# Patient Record
Sex: Male | Born: 1955 | Race: Black or African American | Hispanic: No | Marital: Married | Smoking: Former smoker
Health system: Southern US, Community
[De-identification: ages and names within clinical notes are randomized; demographics above are authoritative.]

## PROBLEM LIST (undated history)

## (undated) DIAGNOSIS — R198 Other specified symptoms and signs involving the digestive system and abdomen: Secondary | ICD-10-CM

## (undated) DIAGNOSIS — Z9289 Personal history of other medical treatment: Secondary | ICD-10-CM

## (undated) DIAGNOSIS — K56609 Unspecified intestinal obstruction, unspecified as to partial versus complete obstruction: Secondary | ICD-10-CM

## (undated) DIAGNOSIS — F419 Anxiety disorder, unspecified: Secondary | ICD-10-CM

## (undated) DIAGNOSIS — K469 Unspecified abdominal hernia without obstruction or gangrene: Secondary | ICD-10-CM

## (undated) DIAGNOSIS — K439 Ventral hernia without obstruction or gangrene: Secondary | ICD-10-CM

## (undated) DIAGNOSIS — C61 Malignant neoplasm of prostate: Secondary | ICD-10-CM

## (undated) DIAGNOSIS — K5090 Crohn's disease, unspecified, without complications: Secondary | ICD-10-CM

## (undated) HISTORY — PX: COLON SURGERY: SHX602

## (undated) HISTORY — DX: Crohn's disease, unspecified, without complications: K50.90

## (undated) HISTORY — DX: Unspecified intestinal obstruction, unspecified as to partial versus complete obstruction: K56.609

## (undated) HISTORY — DX: Other specified symptoms and signs involving the digestive system and abdomen: R19.8

## (undated) HISTORY — DX: Anxiety disorder, unspecified: F41.9

## (undated) HISTORY — DX: Ventral hernia without obstruction or gangrene: K43.9

---

## 1980-12-22 DIAGNOSIS — K5090 Crohn's disease, unspecified, without complications: Secondary | ICD-10-CM

## 1980-12-22 HISTORY — DX: Crohn's disease, unspecified, without complications: K50.90

## 1986-12-22 HISTORY — PX: FLEXIBLE SIGMOIDOSCOPY: SHX1649

## 1990-12-22 DIAGNOSIS — Z9289 Personal history of other medical treatment: Secondary | ICD-10-CM

## 1990-12-22 HISTORY — PX: APPENDECTOMY: SHX54

## 1990-12-22 HISTORY — PX: COLOSTOMY: SHX63

## 1990-12-22 HISTORY — PX: EXPLORATORY LAPAROTOMY: SUR591

## 1990-12-22 HISTORY — DX: Personal history of other medical treatment: Z92.89

## 1991-12-23 HISTORY — PX: HEMICOLOECTOMY W/ ANASTOMOSIS: SHX1739

## 1994-12-22 HISTORY — PX: HERNIA REPAIR: SHX51

## 2002-10-01 ENCOUNTER — Encounter (INDEPENDENT_AMBULATORY_CARE_PROVIDER_SITE_OTHER): Payer: Self-pay | Admitting: Family Medicine

## 2002-10-08 ENCOUNTER — Encounter (INDEPENDENT_AMBULATORY_CARE_PROVIDER_SITE_OTHER): Payer: Self-pay | Admitting: Family Medicine

## 2002-10-09 ENCOUNTER — Encounter (INDEPENDENT_AMBULATORY_CARE_PROVIDER_SITE_OTHER): Payer: Self-pay | Admitting: Family Medicine

## 2002-10-10 ENCOUNTER — Encounter (INDEPENDENT_AMBULATORY_CARE_PROVIDER_SITE_OTHER): Payer: Self-pay | Admitting: Family Medicine

## 2006-04-09 ENCOUNTER — Ambulatory Visit: Payer: Self-pay | Admitting: Family Medicine

## 2006-04-27 ENCOUNTER — Ambulatory Visit: Payer: Self-pay | Admitting: Family Medicine

## 2006-04-27 LAB — CONVERTED CEMR LAB
RBC count: 4.44 10*6/uL
WBC, blood: 9.7 10*3/uL

## 2006-04-28 ENCOUNTER — Encounter (INDEPENDENT_AMBULATORY_CARE_PROVIDER_SITE_OTHER): Payer: Self-pay | Admitting: Family Medicine

## 2006-05-04 ENCOUNTER — Ambulatory Visit: Payer: Self-pay | Admitting: Family Medicine

## 2006-06-01 ENCOUNTER — Ambulatory Visit: Payer: Self-pay | Admitting: Family Medicine

## 2006-06-04 ENCOUNTER — Encounter (INDEPENDENT_AMBULATORY_CARE_PROVIDER_SITE_OTHER): Payer: Self-pay | Admitting: Family Medicine

## 2006-06-08 ENCOUNTER — Encounter (INDEPENDENT_AMBULATORY_CARE_PROVIDER_SITE_OTHER): Payer: Self-pay | Admitting: Family Medicine

## 2006-06-09 ENCOUNTER — Ambulatory Visit: Payer: Self-pay | Admitting: Family Medicine

## 2006-06-17 ENCOUNTER — Encounter (INDEPENDENT_AMBULATORY_CARE_PROVIDER_SITE_OTHER): Payer: Self-pay | Admitting: Family Medicine

## 2006-06-19 ENCOUNTER — Ambulatory Visit: Payer: Self-pay | Admitting: Family Medicine

## 2006-06-19 ENCOUNTER — Ambulatory Visit: Payer: Self-pay | Admitting: Internal Medicine

## 2007-01-25 ENCOUNTER — Encounter: Payer: Self-pay | Admitting: Family Medicine

## 2007-01-25 DIAGNOSIS — K5090 Crohn's disease, unspecified, without complications: Secondary | ICD-10-CM | POA: Insufficient documentation

## 2007-01-25 DIAGNOSIS — F411 Generalized anxiety disorder: Secondary | ICD-10-CM | POA: Insufficient documentation

## 2007-01-25 DIAGNOSIS — M545 Low back pain: Secondary | ICD-10-CM

## 2008-09-13 ENCOUNTER — Emergency Department (HOSPITAL_COMMUNITY): Admission: EM | Admit: 2008-09-13 | Discharge: 2008-09-13 | Payer: Self-pay | Admitting: Emergency Medicine

## 2009-02-12 ENCOUNTER — Emergency Department (HOSPITAL_COMMUNITY): Admission: EM | Admit: 2009-02-12 | Discharge: 2009-02-12 | Payer: Self-pay | Admitting: Emergency Medicine

## 2009-04-05 ENCOUNTER — Emergency Department (HOSPITAL_COMMUNITY): Admission: EM | Admit: 2009-04-05 | Discharge: 2009-04-05 | Payer: Self-pay | Admitting: Emergency Medicine

## 2009-05-22 HISTORY — PX: ESOPHAGOGASTRODUODENOSCOPY: SHX1529

## 2009-06-01 ENCOUNTER — Ambulatory Visit: Payer: Self-pay | Admitting: Internal Medicine

## 2009-06-01 ENCOUNTER — Inpatient Hospital Stay (HOSPITAL_COMMUNITY): Admission: EM | Admit: 2009-06-01 | Discharge: 2009-06-03 | Payer: Self-pay | Admitting: Emergency Medicine

## 2009-06-02 ENCOUNTER — Encounter: Payer: Self-pay | Admitting: Gastroenterology

## 2009-06-02 ENCOUNTER — Ambulatory Visit: Payer: Self-pay | Admitting: Gastroenterology

## 2009-06-18 ENCOUNTER — Encounter: Payer: Self-pay | Admitting: Internal Medicine

## 2009-09-19 ENCOUNTER — Ambulatory Visit: Payer: Self-pay | Admitting: Internal Medicine

## 2009-09-20 ENCOUNTER — Encounter: Payer: Self-pay | Admitting: Internal Medicine

## 2009-09-21 ENCOUNTER — Telehealth (INDEPENDENT_AMBULATORY_CARE_PROVIDER_SITE_OTHER): Payer: Self-pay | Admitting: *Deleted

## 2009-09-21 ENCOUNTER — Encounter: Payer: Self-pay | Admitting: Gastroenterology

## 2009-10-04 ENCOUNTER — Ambulatory Visit: Payer: Self-pay | Admitting: Gastroenterology

## 2009-10-04 ENCOUNTER — Ambulatory Visit (HOSPITAL_COMMUNITY): Admission: RE | Admit: 2009-10-04 | Discharge: 2009-10-04 | Payer: Self-pay | Admitting: Gastroenterology

## 2009-10-04 HISTORY — PX: EUS: SHX5427

## 2009-10-31 ENCOUNTER — Encounter: Payer: Self-pay | Admitting: Internal Medicine

## 2009-10-31 ENCOUNTER — Encounter: Payer: Self-pay | Admitting: Gastroenterology

## 2009-12-03 DIAGNOSIS — D649 Anemia, unspecified: Secondary | ICD-10-CM

## 2009-12-03 DIAGNOSIS — Z872 Personal history of diseases of the skin and subcutaneous tissue: Secondary | ICD-10-CM | POA: Insufficient documentation

## 2009-12-04 ENCOUNTER — Ambulatory Visit: Payer: Self-pay | Admitting: Internal Medicine

## 2009-12-05 ENCOUNTER — Encounter: Payer: Self-pay | Admitting: Internal Medicine

## 2009-12-11 DIAGNOSIS — K439 Ventral hernia without obstruction or gangrene: Secondary | ICD-10-CM

## 2009-12-25 ENCOUNTER — Encounter: Payer: Self-pay | Admitting: Internal Medicine

## 2009-12-27 ENCOUNTER — Encounter: Payer: Self-pay | Admitting: Internal Medicine

## 2009-12-27 ENCOUNTER — Encounter (INDEPENDENT_AMBULATORY_CARE_PROVIDER_SITE_OTHER): Payer: Self-pay

## 2009-12-27 LAB — CONVERTED CEMR LAB
ALT: <8 units/L (ref 0–53)
AST: 12 units/L (ref 0–37)
Albumin: 3.5 g/dL (ref 3.5–5.2)
Alkaline Phosphatase: 64 units/L (ref 39–117)
BUN: 9 mg/dL (ref 6–23)
Basophils Absolute: 0 10*3/uL (ref 0.0–0.1)
Basophils Relative: 0 % (ref 0–1)
Calcium: 8.1 mg/dL — ABNORMAL LOW (ref 8.4–10.5)
Chloride: 104 meq/L (ref 96–112)
Eosinophils Absolute: 0.2 10*3/uL (ref 0.0–0.7)
Eosinophils Relative: 3 % (ref 0–5)
Glucose, Bld: 106 mg/dL — ABNORMAL HIGH (ref 70–99)
HCT: 30.6 % — ABNORMAL LOW (ref 39.0–52.0)
Hemoglobin: 9.4 g/dL — ABNORMAL LOW (ref 13.0–17.0)
Lymphocytes Relative: 21 % (ref 12–46)
Lymphs Abs: 1.6 10*3/uL (ref 0.7–4.0)
MCV: 93.3 fL (ref 78.0–100.0)
Monocytes Absolute: 0.6 10*3/uL (ref 0.1–1.0)
Monocytes Relative: 8 % (ref 3–12)
Neutro Abs: 5 10*3/uL (ref 1.7–7.7)
Platelets: 390 10*3/uL (ref 150–400)
Potassium: 3.7 meq/L (ref 3.5–5.3)
RBC: 3.28 M/uL — ABNORMAL LOW (ref 4.22–5.81)
RDW: 14.9 % (ref 11.5–15.5)
Sodium: 140 meq/L (ref 135–145)
Total Bilirubin: 0.5 mg/dL (ref 0.3–1.2)
WBC: 7.4 10*3/uL (ref 4.0–10.5)

## 2009-12-31 ENCOUNTER — Ambulatory Visit (HOSPITAL_COMMUNITY): Admission: RE | Admit: 2009-12-31 | Discharge: 2009-12-31 | Payer: Self-pay | Admitting: Internal Medicine

## 2010-01-07 ENCOUNTER — Ambulatory Visit (HOSPITAL_COMMUNITY): Admission: RE | Admit: 2010-01-07 | Discharge: 2010-01-07 | Payer: Self-pay | Admitting: Internal Medicine

## 2010-01-11 LAB — CONVERTED CEMR LAB
ALT: <8 units/L (ref 0–53)
AST: 16 units/L (ref 0–37)
Albumin: 3.6 g/dL (ref 3.5–5.2)
Alkaline Phosphatase: 61 units/L (ref 39–117)
Bilirubin, Direct: 0.1 mg/dL (ref 0.0–0.3)
Folate: 8.6 ng/mL
Indirect Bilirubin: 0.3 mg/dL (ref 0.0–0.9)
Iron: 35 ug/dL — ABNORMAL LOW (ref 42–165)
TIBC: 357 ug/dL (ref 215–435)
Total Bilirubin: 0.4 mg/dL (ref 0.3–1.2)
Total Protein: 7.2 g/dL (ref 6.0–8.3)
UIBC: 322 ug/dL
Vitamin B-12: 435 pg/mL (ref 211–911)

## 2010-01-25 ENCOUNTER — Ambulatory Visit: Payer: Self-pay | Admitting: Internal Medicine

## 2010-01-28 ENCOUNTER — Encounter: Payer: Self-pay | Admitting: Internal Medicine

## 2010-02-05 ENCOUNTER — Encounter (INDEPENDENT_AMBULATORY_CARE_PROVIDER_SITE_OTHER): Payer: Self-pay

## 2010-02-11 ENCOUNTER — Encounter: Payer: Self-pay | Admitting: Urgent Care

## 2010-02-11 ENCOUNTER — Telehealth (INDEPENDENT_AMBULATORY_CARE_PROVIDER_SITE_OTHER): Payer: Self-pay

## 2010-02-13 ENCOUNTER — Encounter: Payer: Self-pay | Admitting: Urgent Care

## 2010-02-21 ENCOUNTER — Encounter: Payer: Self-pay | Admitting: Internal Medicine

## 2010-03-27 ENCOUNTER — Telehealth (INDEPENDENT_AMBULATORY_CARE_PROVIDER_SITE_OTHER): Payer: Self-pay

## 2010-03-29 ENCOUNTER — Encounter (INDEPENDENT_AMBULATORY_CARE_PROVIDER_SITE_OTHER): Payer: Self-pay | Admitting: *Deleted

## 2010-04-04 ENCOUNTER — Encounter (INDEPENDENT_AMBULATORY_CARE_PROVIDER_SITE_OTHER): Payer: Self-pay

## 2010-04-04 ENCOUNTER — Encounter: Payer: Self-pay | Admitting: Urgent Care

## 2010-04-11 ENCOUNTER — Encounter: Payer: Self-pay | Admitting: Gastroenterology

## 2010-04-19 ENCOUNTER — Encounter (INDEPENDENT_AMBULATORY_CARE_PROVIDER_SITE_OTHER): Payer: Self-pay

## 2010-04-19 ENCOUNTER — Telehealth (INDEPENDENT_AMBULATORY_CARE_PROVIDER_SITE_OTHER): Payer: Self-pay

## 2010-04-29 LAB — CONVERTED CEMR LAB
Albumin: 3.7 g/dL (ref 3.5–5.2)
Alkaline Phosphatase: 62 units/L (ref 39–117)
BUN: 10 mg/dL (ref 6–23)
Basophils Absolute: 0.1 10*3/uL (ref 0.0–0.1)
Basophils Relative: 1 % (ref 0–1)
Calcium: 8.7 mg/dL (ref 8.4–10.5)
Creatinine, Ser: 1.31 mg/dL (ref 0.40–1.50)
Eosinophils Absolute: 0.5 10*3/uL (ref 0.0–0.7)
Glucose, Bld: 82 mg/dL (ref 70–99)
Hemoglobin: 9.8 g/dL — ABNORMAL LOW (ref 13.0–17.0)
Lymphocytes Relative: 33 % (ref 12–46)
Lymphs Abs: 2.4 10*3/uL (ref 0.7–4.0)
MCHC: 32 g/dL (ref 30.0–36.0)
MCV: 89.7 fL (ref 78.0–100.0)
Monocytes Absolute: 0.5 10*3/uL (ref 0.1–1.0)
Monocytes Relative: 7 % (ref 3–12)
Neutro Abs: 3.8 10*3/uL (ref 1.7–7.7)
Neutrophils Relative %: 52 % (ref 43–77)
Potassium: 4.3 meq/L (ref 3.5–5.3)
RBC: 3.41 M/uL — ABNORMAL LOW (ref 4.22–5.81)
RDW: 15.7 % — ABNORMAL HIGH (ref 11.5–15.5)
Sodium: 138 meq/L (ref 135–145)
Total Bilirubin: 0.5 mg/dL (ref 0.3–1.2)
Total Protein: 7 g/dL (ref 6.0–8.3)
WBC: 7.2 10*3/uL (ref 4.0–10.5)

## 2010-05-06 LAB — CONVERTED CEMR LAB
Eosinophils Absolute: 0.5 10*3/uL (ref 0.0–0.7)
Eosinophils Relative: 7 % — ABNORMAL HIGH (ref 0–5)
HCT: 32.5 % — ABNORMAL LOW (ref 39.0–52.0)
Hemoglobin: 10 g/dL — ABNORMAL LOW (ref 13.0–17.0)
Lymphocytes Relative: 35 % (ref 12–46)
Lymphs Abs: 2.3 10*3/uL (ref 0.7–4.0)
MCHC: 30.8 g/dL (ref 30.0–36.0)
MCV: 92.3 fL (ref 78.0–100.0)
Monocytes Absolute: 0.4 10*3/uL (ref 0.1–1.0)
Neutro Abs: 3.4 10*3/uL (ref 1.7–7.7)
Neutrophils Relative %: 51 % (ref 43–77)
Platelets: 389 10*3/uL (ref 150–400)
RBC: 3.52 M/uL — ABNORMAL LOW (ref 4.22–5.81)
WBC: 6.7 10*3/uL (ref 4.0–10.5)

## 2010-05-07 ENCOUNTER — Ambulatory Visit: Payer: Self-pay | Admitting: Internal Medicine

## 2010-05-08 ENCOUNTER — Encounter: Payer: Self-pay | Admitting: Urgent Care

## 2010-05-23 LAB — CONVERTED CEMR LAB
Basophils Absolute: 0.1 10*3/uL (ref 0.0–0.1)
Basophils Relative: 1 % (ref 0–1)
Eosinophils Absolute: 0.5 10*3/uL (ref 0.0–0.7)
Eosinophils Relative: 8 % — ABNORMAL HIGH (ref 0–5)
Hemoglobin: 11.4 g/dL — ABNORMAL LOW (ref 13.0–17.0)
Lymphs Abs: 2.2 10*3/uL (ref 0.7–4.0)
MCHC: 30.4 g/dL (ref 30.0–36.0)
MCV: 94.2 fL (ref 78.0–100.0)
Monocytes Absolute: 0.3 10*3/uL (ref 0.1–1.0)
Monocytes Relative: 5 % (ref 3–12)
Neutro Abs: 3.3 10*3/uL (ref 1.7–7.7)
Neutrophils Relative %: 51 % (ref 43–77)
Platelets: 370 10*3/uL (ref 150–400)
RBC: 3.98 M/uL — ABNORMAL LOW (ref 4.22–5.81)
WBC: 6.4 10*3/uL (ref 4.0–10.5)

## 2010-06-03 ENCOUNTER — Encounter: Payer: Self-pay | Admitting: Urgent Care

## 2010-06-03 LAB — CONVERTED CEMR LAB
Basophils Absolute: 0.1 10*3/uL (ref 0.0–0.1)
Basophils Relative: 1 % (ref 0–1)
Eosinophils Relative: 6 % — ABNORMAL HIGH (ref 0–5)
HCT: 35.4 % — ABNORMAL LOW (ref 39.0–52.0)
Hemoglobin: 11.7 g/dL — ABNORMAL LOW (ref 13.0–17.0)
Lymphocytes Relative: 35 % (ref 12–46)
Lymphs Abs: 2.1 10*3/uL (ref 0.7–4.0)
MCHC: 33.1 g/dL (ref 30.0–36.0)
MCV: 92.4 fL (ref 78.0–100.0)
Monocytes Absolute: 0.5 10*3/uL (ref 0.1–1.0)
Monocytes Relative: 8 % (ref 3–12)
Neutro Abs: 3 10*3/uL (ref 1.7–7.7)
Neutrophils Relative %: 49 % (ref 43–77)
Platelets: 305 10*3/uL (ref 150–400)
RBC: 3.83 M/uL — ABNORMAL LOW (ref 4.22–5.81)
RDW: 14.2 % (ref 11.5–15.5)

## 2010-06-10 ENCOUNTER — Encounter: Payer: Self-pay | Admitting: Gastroenterology

## 2010-06-21 ENCOUNTER — Encounter (INDEPENDENT_AMBULATORY_CARE_PROVIDER_SITE_OTHER): Payer: Self-pay

## 2010-06-21 LAB — CONVERTED CEMR LAB
ALT: <8 units/L (ref 0–53)
AST: 15 units/L (ref 0–37)
Albumin: 3.7 g/dL (ref 3.5–5.2)
Alkaline Phosphatase: 50 units/L (ref 39–117)
BUN: 15 mg/dL (ref 6–23)
Basophils Relative: 1 % (ref 0–1)
Calcium: 9 mg/dL (ref 8.4–10.5)
Chloride: 105 meq/L (ref 96–112)
Eosinophils Absolute: 0.3 10*3/uL (ref 0.0–0.7)
Glucose, Bld: 82 mg/dL (ref 70–99)
HCT: 36.9 % — ABNORMAL LOW (ref 39.0–52.0)
Hemoglobin: 12.2 g/dL — ABNORMAL LOW (ref 13.0–17.0)
Lymphocytes Relative: 32 % (ref 12–46)
Lymphs Abs: 2.1 10*3/uL (ref 0.7–4.0)
MCHC: 33.1 g/dL (ref 30.0–36.0)
MCV: 92.3 fL (ref 78.0–100.0)
Monocytes Absolute: 0.4 10*3/uL (ref 0.1–1.0)
Neutro Abs: 3.7 10*3/uL (ref 1.7–7.7)
Neutrophils Relative %: 56 % (ref 43–77)
Platelets: 380 10*3/uL (ref 150–400)
Potassium: 4.1 meq/L (ref 3.5–5.3)
RBC: 4 M/uL — ABNORMAL LOW (ref 4.22–5.81)
RDW: 14.2 % (ref 11.5–15.5)
Total Bilirubin: 0.5 mg/dL (ref 0.3–1.2)
WBC: 6.6 10*3/uL (ref 4.0–10.5)

## 2010-07-01 ENCOUNTER — Encounter: Payer: Self-pay | Admitting: Urgent Care

## 2010-07-09 ENCOUNTER — Inpatient Hospital Stay (HOSPITAL_COMMUNITY): Admission: EM | Admit: 2010-07-09 | Discharge: 2010-07-11 | Payer: Self-pay | Admitting: Emergency Medicine

## 2010-07-09 ENCOUNTER — Ambulatory Visit: Payer: Self-pay | Admitting: Internal Medicine

## 2010-07-10 ENCOUNTER — Ambulatory Visit: Payer: Self-pay | Admitting: Gastroenterology

## 2010-07-18 ENCOUNTER — Encounter: Payer: Self-pay | Admitting: Urgent Care

## 2010-07-18 ENCOUNTER — Emergency Department (HOSPITAL_COMMUNITY): Admission: EM | Admit: 2010-07-18 | Discharge: 2010-07-18 | Payer: Self-pay | Admitting: Emergency Medicine

## 2010-07-19 ENCOUNTER — Encounter (INDEPENDENT_AMBULATORY_CARE_PROVIDER_SITE_OTHER): Payer: Self-pay

## 2010-07-19 ENCOUNTER — Emergency Department (HOSPITAL_COMMUNITY): Admission: EM | Admit: 2010-07-19 | Discharge: 2010-07-19 | Payer: Self-pay | Admitting: Emergency Medicine

## 2010-07-24 ENCOUNTER — Encounter: Payer: Self-pay | Admitting: Internal Medicine

## 2010-07-31 ENCOUNTER — Encounter: Payer: Self-pay | Admitting: Urgent Care

## 2010-08-02 ENCOUNTER — Encounter: Payer: Self-pay | Admitting: Urgent Care

## 2010-08-20 ENCOUNTER — Encounter (INDEPENDENT_AMBULATORY_CARE_PROVIDER_SITE_OTHER): Payer: Self-pay

## 2010-08-27 ENCOUNTER — Ambulatory Visit: Payer: Self-pay | Admitting: Internal Medicine

## 2010-09-09 ENCOUNTER — Encounter (INDEPENDENT_AMBULATORY_CARE_PROVIDER_SITE_OTHER): Payer: Self-pay

## 2010-09-24 ENCOUNTER — Emergency Department (HOSPITAL_COMMUNITY): Admission: EM | Admit: 2010-09-24 | Discharge: 2010-09-24 | Payer: Self-pay | Admitting: Emergency Medicine

## 2010-09-27 ENCOUNTER — Encounter: Payer: Self-pay | Admitting: Internal Medicine

## 2010-09-30 ENCOUNTER — Encounter: Payer: Self-pay | Admitting: Internal Medicine

## 2010-10-15 ENCOUNTER — Ambulatory Visit: Payer: Self-pay | Admitting: Internal Medicine

## 2010-10-16 ENCOUNTER — Telehealth (INDEPENDENT_AMBULATORY_CARE_PROVIDER_SITE_OTHER): Payer: Self-pay | Admitting: *Deleted

## 2010-10-16 ENCOUNTER — Encounter: Payer: Self-pay | Admitting: Internal Medicine

## 2010-10-22 ENCOUNTER — Ambulatory Visit (HOSPITAL_COMMUNITY): Admission: RE | Admit: 2010-10-22 | Discharge: 2010-10-22 | Payer: Self-pay | Admitting: Internal Medicine

## 2010-10-25 ENCOUNTER — Encounter: Payer: Self-pay | Admitting: Gastroenterology

## 2010-10-25 ENCOUNTER — Telehealth: Payer: Self-pay | Admitting: Gastroenterology

## 2010-11-13 ENCOUNTER — Ambulatory Visit: Payer: Self-pay | Admitting: Internal Medicine

## 2010-11-13 ENCOUNTER — Encounter (INDEPENDENT_AMBULATORY_CARE_PROVIDER_SITE_OTHER): Payer: Self-pay

## 2010-11-18 ENCOUNTER — Encounter: Payer: Self-pay | Admitting: Urgent Care

## 2011-01-11 ENCOUNTER — Emergency Department (HOSPITAL_COMMUNITY)
Admission: EM | Admit: 2011-01-11 | Discharge: 2011-01-11 | Payer: Self-pay | Source: Home / Self Care | Admitting: Emergency Medicine

## 2011-01-14 LAB — CBC
HCT: 39.1 % (ref 39.0–52.0)
Hemoglobin: 13.6 g/dL (ref 13.0–17.0)
MCH: 33.1 pg (ref 26.0–34.0)
MCHC: 34.8 g/dL (ref 30.0–36.0)
Platelets: 286 10*3/uL (ref 150–400)
RBC: 4.11 MIL/uL — ABNORMAL LOW (ref 4.22–5.81)
WBC: 9.1 10*3/uL (ref 4.0–10.5)

## 2011-01-14 LAB — URINALYSIS, ROUTINE W REFLEX MICROSCOPIC
Bilirubin Urine: NEGATIVE
Nitrite: NEGATIVE
Protein, ur: NEGATIVE mg/dL
Specific Gravity, Urine: 1.025 (ref 1.005–1.030)
Urine Glucose, Fasting: NEGATIVE mg/dL
Urobilinogen, UA: 0.2 mg/dL (ref 0.0–1.0)
pH: 6 (ref 5.0–8.0)

## 2011-01-14 LAB — DIFFERENTIAL
Basophils Absolute: 0 10*3/uL (ref 0.0–0.1)
Basophils Relative: 0 % (ref 0–1)
Eosinophils Absolute: 0.2 10*3/uL (ref 0.0–0.7)
Eosinophils Relative: 2 % (ref 0–5)
Lymphs Abs: 2 10*3/uL (ref 0.7–4.0)
Monocytes Absolute: 0.9 10*3/uL (ref 0.1–1.0)
Monocytes Relative: 10 % (ref 3–12)
Neutrophils Relative %: 65 % (ref 43–77)

## 2011-01-14 LAB — COMPREHENSIVE METABOLIC PANEL
AST: 22 U/L (ref 0–37)
Albumin: 3.3 g/dL — ABNORMAL LOW (ref 3.5–5.2)
Alkaline Phosphatase: 55 U/L (ref 39–117)
BUN: 11 mg/dL (ref 6–23)
CO2: 32 mEq/L (ref 19–32)
Calcium: 9 mg/dL (ref 8.4–10.5)
Chloride: 101 mEq/L (ref 96–112)
Creatinine, Ser: 1.37 mg/dL (ref 0.4–1.5)
GFR calc Af Amer: >60 mL/min (ref >60)
GFR calc non Af Amer: 55 mL/min — ABNORMAL LOW (ref >60)
Glucose, Bld: 94 mg/dL (ref 70–99)
Total Bilirubin: 0.9 mg/dL (ref 0.3–1.2)

## 2011-01-21 NOTE — Letter (Signed)
Summary: [facility]  [address]   [place], [place] [address]   Phone: [phone]  Fax: [phone]     February 05, 2010   Daniel Howard [address] [place], [place]  [address] [birth year]  Dear Daniel Howard,  During your last appointment, your doctor requested you have some blood work and a TB test.  Our records indicate you have not had this done.  Remember it is very important to follow your doctor's instructions.   Please have this blood work done as soon as possible.  It is important that patients and their doctor work together in the management/treatment of their health care.  If you have lost or misplaced your lab orders, please give [place] a call and we will gladly give you a new copy.  If you have already had your blood work drawn, please disregard this letter.  Thank you,    Burnadette Peter LPN  [facility] Ph: [phone]   Fax: [phone]

## 2011-01-21 NOTE — Progress Notes (Signed)
Summary: Pt lost lab/TB skin test order/ new ones given  Phone Note Call from Patient   Caller: Patient Summary of Call: Pt came by office and had lost his lab order/ TB skin test order. Gave him new one of each. Initial call taken by: Waldon Merl LPN,  February 11, 6121 10:04 AM     Appended Document: Pt lost lab/TB skin test order/ new ones given Pt's wife left VM. Michela Pitcher they got the labs done today. But the [facility] Dept said he would have to pay $15.00 for the TB skin test, and they don't have the money now, but will get it as soon as they can.  Appended Document: Pt lost lab/TB skin test order/ new ones given We need to start meds ASAP, it takes 3 days to read TB, really need to try & get this done.  Appended Document: Pt lost lab/TB skin test order/ new ones given Spoke with Mrs. Reasner and informed her. She said she will let us know as soon as he gets it.  Appended Document: Pt lost lab/TB skin test order/ new ones given Prom[facility] labs show normal activity.  We can start treatment once PPD is read.  Please let pt know.  Appended Document: Pt lost lab/TB skin test order/ new ones given tried to call pt- LMOM  Appended Document: Pt lost lab/TB skin test order/ new ones given pts wife aware and will bring results when they get it done

## 2011-01-21 NOTE — Assessment & Plan Note (Signed)
Summary: f/u crohn's. gu   Visit Type:  f/u Primary Care Provider:  Hasanaj  Chief Complaint:  crohns follow up.  History of Present Illness: Daniel Howard is here for f/u. History of complicated ileocolonic Crohn's disease requiring subtotal colectomy/ multiple surgeries over the years. History of noncompliance. Hx IDA. He was started on Azathioprine within last 6 weeks. We have been following CBC. Hgb is slightly better at 10. LFT, MET-7 normal. He is on MVI with iron and iron 11m once daily. He states he is compliant with his medications. [facility]. He was given Cipro/Flagy, and has completed about half. He says he had xray. He is feeling better. Abdominal pain resolved. Stool output in ostomy bag is about same. Stool semiformed, nonbloody. Denies fever, chills. 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   Current Medications (verified): 1)  Multi Vitamin .... Take 1 Tablet By Mouth Once A Day 2)  Iron 65 Mg .... Take 1 Tablet By Mouth Once A Day 3)  Vicodin 5-500 Mg Tabs (Hydrocodone-Acetaminophen) .... As Needed 4)  Azathioprine 50 Mg Tabs (Azathioprine) .... Once Daily 5)  Flagyl 500 Mg Tabs (Metronidazole) .... Three Times A Day 6)  Cipro 500 Mg Tabs (Ciprofloxacin Hcl) .... Two Times A Day 7)  Calcium 12058m.... Once Daily  Allergies (verified): 1)  ! Pcn  Review of Systems      See HPI  Vital Signs:  Patient profile:   [age]ear old male Height:      64 inches Weight:      145 pounds BMI:     24.98 Temp:     97.9 degrees F oral Pulse rate:   76 / minute BP sitting:   110 / 80  (left arm) Cuff size:   regular  Vitals Entered By: Daniel PeterPN (May 17, [id number])  Physical Exam  General:  Well developed, well nourished, no acute distress. Head:  Normocephalic and atraumatic. Mouth:  OP moist. Abdomen:  Multiple old surgical scars.  Ostomy beefy red and protruding,  Soft  medium brown stool in bag. Soft, nontender and nondistended. No masses, hepatosplenomegaly or hernias noted. Normal bowel sounds.without guarding and without rebound.   Extremities:  No clubbing, cyanosis, edema or deformities noted. Neurologic:  Alert and  oriented x4;  grossly normal neurologically. Skin:  Intact without significant lesions or rashes. Psych:  Alert and cooperative. Normal mood and affect.  Impression & Recommendations:  Problem # 1:  CROHN'S DISEASE (ICD-555.9)  [age]/o black male w/ complicated ileocolonic Crohn's disease.  Hx noncompliance.  Clinically, he is doing well at this time. ?recent crampy abd pain secondary to Crohn's vs hernia. Complete cipro/flagy.  He has IDA, stable. Will increase iron to bid. Continue Imuran.  I discussed compliance w/ pt.  He will continue serial CBCs as outlined. He agrees w/ this plan. OV with Daniel Howard 3 months.  Orders: Est. Patient Level II (9([id number] Appended Document: f/u crohn's. gu LM w/ male at pt's number.  His prometheus labs came back low, dose needs to be adjusted IF HE IS COMPLIANT w/ his meds.  Please find out if he is taking as directed and if so, for how long? His dose will need to be increased to 7530maily if so.  Thanks  Appended Document: f/u crohn's. gu pts wife called- he has been taking them everyday since 04/19/10.  She is aware to increase to 46m once daily.

## 2011-01-21 NOTE — Assessment & Plan Note (Signed)
Summary: ER REFERRAL/ABD PAIN,CROHNS DISEASE EXACCRBATION/LAW   Visit Type:  Follow-up Visit Primary Care Provider:  Hasanaj  Chief Complaint:  ER Referral/ abd pain/ Crohns.  History of Present Illness: Recently had increased abd pain. Went to ED on 09/24/10. Crampy and severe, last several minutes at time. Intermittent increased output in ostomy (watery). Stool mostly brown or dark green. No NSAIDS or ASA. No N/V. Appetite okay. No brbpr. No melena. No fever. Reports compliance with iron and azathioprine.   CT A/P 10/11--> Active Crohn's disease involving distal small bowel loops in right mid abdomen and right pelvis, with significant bowel wall thickening, peri-intestinal inflammatory changes/edema, and associated mesenteric adenopathy are identified.  Single loop in the right pelvis shows mild dilatation without obstruction. No definite evidence of abscess or perforation.  Small bowel hernia through a fascial defect in the left mid abdomen, question prior ostomy site.  Biliary and slightly increased pancreatic ductal dilatation since previous exam, without definite evidence of acute pancreatitis.  Labs 09/24/10->CBC, U/A, MET-7 normal. LFTS normal except albumin of 3. H/H 12.5/37.1, WBC 6300.  Current Medications (verified): 1)  Multi Vitamin .... Take 1 Tablet By Mouth Once A Day 2)  Azathioprine 50 Mg Tabs (Azathioprine) .... 2 By Mouth Daily 3)  Calcium 1272m .... Once Daily 4)  Hydrocodone 5/325 Mg .... As Needed 5)  Ferrous Sulfate 325 (65 Fe) Mg Tabs (Ferrous Sulfate) .... Two Tablets Daily  Allergies (verified): 1)  ! Pcn  Past History:  Past Medical History: Anxiety Low back pain Crohn's  Review of Systems      See HPI  Vital Signs:  Patient profile:   55year old male Height:      64 inches Weight:      138 pounds BMI:     23.77 Temp:     97.9 degrees F oral Pulse rate:   72 / minute BP sitting:   100 / 60  (left arm) Cuff size:   regular  Vitals Entered By:  DWaldon MerlLPN (October 25, 240978:26 AM)  Physical Exam  General:  Well developed, well nourished, no acute distress. Head:  Normocephalic and atraumatic. Mouth:  OP moist. Abdomen:  Soft. Ostomy in RLQ. Mild tenderness around ostomy. No HSM or masses. Granulation tissue noted at old ostomy site and midline incision. Extremities:  No clubbing, cyanosis, edema or deformities noted. Neurologic:  Alert and  oriented x4;  grossly normal neurologically. Skin:  Intact without significant lesions or rashes. Psych:  Alert and cooperative. Normal mood and affect.  Impression & Recommendations:  Problem # 1:  CROHN'S DISEASE (ICD-555.9)  Active Crohn's disease based on CT findings. Recent increase in abd pain but feels better at this point. Intermittent increase in ostomy output but not on regular basis. He is on therapeutic dose of azathioprine. Reviewed CT findings with Dr. RGala Romney Will start Entocort 954mdaily. F/U in 4 weeks with Dr. RoGala Romney  Orders: Est. Patient Level II (9(35329 Problem # 2:  NONSPEC ABN FINDNG RAD & OTH EXAM ABDOMINAL AREA ([id number]) New biliary and pancreatic ductal dilation on CT. LFTs and lipase unremarkable. Discussed with Dr. RoGala RomneyRecommend MRCP to further evaluate.  Prescriptions: ENTOCORT EC 3 MG XR24H-CAP (BUDESONIDE) Three by mouth daily for four weeks, then drop to two daily until seen in office.  #90 x 0   Entered and Authorized by:   LeLaureen OchsLeBernarda Caffey Signed by:   LeLaureen Ochsewis PA-C on 10/15/2010   Method  used:   Print then Give to Patient   RxID:   [id number] HYDROCODONE-ACETAMINOPHEN 5-325 MG TABS (HYDROCODONE-ACETAMINOPHEN) one by mouth every 4-6 hours as needed pain  #30 x 0   Entered and Authorized by:   Laureen Ochs. Bernarda Caffey   Signed by:   Laureen Ochs Buffy Ehler PA-C on 10/15/2010   Method used:   Print then Give to Patient   RxID:   [id number]   Appended Document: ER REFERRAL/ABD PAIN,CROHNS DISEASE EXACCRBATION/LAW Please let  patient know, RMR plan as outlined below:  MRCP to further evaluate new biliary and pancreatic duct dilation Start Entocort 3 by mouth daily for next four weeks and then two daily until OV. Further taper instructions to be provided. Please send RX to [facility] (see printed copy). Needs OV with RMR only in four weeks.   Appended Document: ER REFERRAL/ABD PAIN,CROHNS DISEASE EXACCRBATION/LAW Pt was informed. Rx faxed to [facility].  Appended Document: ER REFERRAL/ABD PAIN,CROHNS DISEASE EXACCRBATION/LAW Pt scheduled for MRCP 10/21/10@8 :00 Pt wife aware of appt.  Appended Document: ER REFERRAL/ABD PAIN,CROHNS DISEASE EXACCRBATION/LAW pts wife is aware of appt for Nov 13, 2010 @ 1030 w/RMR  Appended Document: ER REFERRAL/ABD PAIN,CROHNS DISEASE EXACCRBATION/LAW 4 WK F/U OV IS IN THE COMPUTER

## 2011-01-21 NOTE — Assessment & Plan Note (Signed)
Summary: ov w/RMR only in 4 weeks/abd pain,crohns disease/ss   Visit Type:  Initial Visit Primary Care Provider:  NONE  Chief Complaint:  F/U abd pain/chrons.  History of Present Illness: Followup Crohn's disease with recent flare documented on CT scan. He is back to baseline on azathioprine and Entocort currently taking 6 mg daily. Ostomy functioning very well no abdominal pain, no nausea or vomiting; his appetite is described as being very good. no fever or chills.   Current Medications (verified): 1)  Multi Vitamin .... Take 1 Tablet By Mouth Once A Day 2)  Azathioprine 50 Mg Tabs (Azathioprine) .... 2 By Mouth Daily 3)  Calcium 1248m .... Once Daily 4)  Hydrocodone-Acetaminophen 5-325 Mg Tabs (Hydrocodone-Acetaminophen) .... One By Mouth Every 4-6 Hours As Needed Pain 5)  Ferrous Sulfate 325 (65 Fe) Mg Tabs (Ferrous Sulfate) .... Two Tablets Daily 6)  Entocort Ec 3 Mg Xr24h-Cap (Budesonide) .... Three By Mouth Daily For Four Weeks, Then Drop To Two Daily Until Seen in Office.  Allergies (verified): 1)  ! Pcn  Past History:  Past Medical History: Last updated: 10/15/2010 Anxiety Low back pain Crohn's  Past Surgical History: Last updated: 12/03/2009 colostomy colectomy  Family History: Last updated: 02010-10-16Father: Deceased age 53 Prostate cancer Mother: Living age 53 healthy Siblings: 128 One brother died aneurysm of brian One brother died  MI One sister died   unknown    Social History: Last updated: 010-16-10Marital Status: Married Children: 8  Occupation   Disability Smokes cigarettes  3 cigs a day Alcohol   yes   beer occasionally Walks daily Patient currently smokes.  Alcohol Use - yes Patient gets regular exercise.  Vital Signs:  Patient profile:   [age]year old male Height:      64 inches Weight:      144 pounds BMI:     24.81 Temp:     98.7 degrees F oral Pulse rate:   64 / minute BP sitting:   118 / 72  (left arm) Cuff size:    regular  Vitals Entered By: DWaldon MerlLPN (November 23, 2076210:32 AM)  Physical Exam  General:  alert conversant appears comfortable at me by his wife Abdomen:  nondistended positive bowel sounds ostomy right lower quadrant appears healthy; he has keloid scar formation from multiple procedures previously superficial skin breakdown at the eschar suspect secondary to a protruding suture. No evidence of cellulitis or other infection  Impression & Recommendations: Impression: 55year old gentleman complicated Crohn's disease now back in remission after recent flare with the  addition of Entocort on top of azathioprine. He had a normal bone density scan back in January of this year.  Recommendations: Continue to avoid nonsteroidal agents  Continue azathioprine. Continue Entocort 6 mg daily until December 22, 2010 and then decrease to 3 milligrams daily until mid February -then discontinue completely.   Office visit here in mid January 2012.  Appended Document: Orders Update    Clinical Lists Changes  Orders: Added new Service order of Est. Patient Level III ((26333 - Signed

## 2011-01-21 NOTE — Letter (Signed)
Summary: Plan of Care, Need to [facility]  [address]   [place], [place] [address]   Phone: [phone]  Fax: [phone]    August 20, 2010  Daniel Howard [address] [place],   [address] [birth year]   Dear [name],   We are writing this letter to inform you of treatment plans and/or discuss your plan of care.  We have tried several times to contact you; however, we have yet to reach you.  We ask that you please contact our office for follow-up on your gastrointestinal issues.  We can  be reached at [phone] to schedule an appointment, or to speak with someone regarding your health care needs.  Please do not neglect your health.   Sincerely,    Burnadette Peter LPN  [facility] Ph: [phone]    Fax: [phone]

## 2011-01-21 NOTE — Medication Information (Signed)
Summary: PA for azathioprine  PA for azathioprine   Imported By: Burnadette Peter LPN 92/78/0044 71:58:06  _____________________________________________________________________  External Attachment:    Type:   Image     Comment:   External Document

## 2011-01-21 NOTE — Letter (Signed)
Summary: Scheduled Appointment  [facility]  [address]   [place], [place] [address]   Phone: [phone]  Fax: [phone]    March 29, 2010   Dear: [name]            DOB: 1956/08/06    I have been instructed to schedule you an appointment in our office.  Your appointment is as follows:   Date: 05/07/10   Time: 10:30     Please be here 15 minutes early.   Provider: Neil Crouch, P.A.-C   Please contact the office if you need to reschedule this appointment for a more convenient time.   Thank you,    [facility] Ph: [phone]   Fax: [phone]

## 2011-01-21 NOTE — Assessment & Plan Note (Signed)
Summary: E15/ABD PAIN.GU   Visit Type:  Follow-up Visit Primary Care Provider:  Hasanaj  Chief Complaint:  F/U abd pain.  History of Present Illness: History of complicated ileocolonic Crohn's disease requiring subtotal colectomy/ multiple surgeries over the years. History of noncompliance. Hx IDA, Labs 1/17.  Normal LFTS.  CTE shows hernias & active Crohn's disease.  Hgb 9-range.  Started iron pills & MVI.  Deneis any abd pain.  Denies any N/V.  Not concerned w/ stool in bag, no blood or mucous.  Denies fever.  Appetite ok.  Has multiple hernias ( bilateral spigelian as well as a peristomal hernia). He was also found to have a duodenal bulbar ulcer dilated bile duct and pancreatic duct on CT. Endoscopic ultrasound confirmed dilation of both ducts but did not feel he had chronic pancreatitis or mass.  Vascular ectasia crimping the distal common bile duct.  He does have occasional joint aches. Normal bone density study was done.   Current Problems (verified): 1)  Abdominal Wall Hernia  (ICD-553.20) 2)  Abscess, Perirectal, Hx of  (ICD-V13.3) 3)  Anemia  (ICD-285.9) 4)  Duodenal Ulcer  (ICD-532.90) 5)  Nonspecific Abn Finding Rad & Oth Exam Gi Tract  (ICD-793.4) 6)  Crohn's Disease  (ICD-555.9) 7)  Low Back Pain  (ICD-724.2) 8)  Anxiety  (ICD-300.00)  Current Medications (verified): 1)  Multi Vitamin .... Take 1 Tablet By Mouth Once A Day 2)  Iron 65 Mg .... Take 1 Tablet By Mouth Once A Day  Allergies (verified): 1)  ! Pcn  Review of Systems      See HPI General:  Denies fever, chills, sweats, anorexia, fatigue, weakness, malaise, weight loss, and sleep disorder. CV:  Denies chest pains, angina, palpitations, syncope, dyspnea on exertion, orthopnea, PND, peripheral edema, and claudication. Resp:  Denies dyspnea at rest, dyspnea with exercise, cough, sputum, wheezing, coughing up blood, and pleurisy. GI:  Denies difficulty swallowing, pain on swallowing, nausea,  indigestion/heartburn, vomiting, vomiting blood, abdominal pain, jaundice, gas/bloating, diarrhea, constipation, change in bowel habits, bloody BM's, black BMs, and fecal incontinence. Derm:  Denies rash, itching, dry skin, hives, moles, warts, and unhealing ulcers. Psych:  Denies depression, anxiety, memory loss, suicidal ideation, hallucinations, paranoia, phobia, and confusion. Heme:  Denies bruising, bleeding, and enlarged lymph nodes.  Vital Signs:  Patient profile:   55 year old male Height:      64 inches Weight:      144 pounds BMI:     24.81 Temp:     98.2 degrees F oral Pulse rate:   76 / minute BP sitting:   100 / 62  (right arm) Cuff size:   regular  Vitals Entered By: Waldon Merl LPN (January 25, 8415 9:43 AM)  Physical Exam  General:  Well developed, well nourished, no acute distress. Head:  Normocephalic and atraumatic. Eyes:  Sclera clear, no icterus. Mouth:  No deformity or lesions, dentition normal. Neck:  Supple; no masses or thyromegaly. Heart:  Regular rate and rhythm; no murmurs, rubs,  or bruits. Abdomen:  Multiple old surgical scars.  Ostomy beefy red and protruding,  Soft medium brown stool in bag. Soft, nontender and nondistended. No masses, hepatosplenomegaly or hernias noted. Normal bowel sounds.without guarding and without rebound.   Msk:  Symmetrical with no gross deformities. Normal posture. Pulses:  Normal pulses noted. Extremities:  No clubbing, cyanosis, edema or deformities noted. Neurologic:  Alert and  oriented x4;  grossly normal neurologically. Skin:  Intact without significant lesions or rashes.  Cervical Nodes:  No significant cervical adenopathy. Psych:  Alert and cooperative. Normal mood and affect.  Impression & Recommendations:  Problem # 1:  CROHN'S DISEASE (ICD-555.9) 55 y/o black male w/ complicated ileocolonic Crohn's disease.  Hx noncompliance.  Clinically, he is doing actually very well at this time. He stopped taking lialda 4  months ago.  He has multiple abdominal wall hernias on CT.  He has IDA and active Crohn's on CTE.  I did discuss his plan of care w/ Dr Gala Romney.  He would like to start Imuran.  Will do TPMT assay prior to initiation.  I discussed compliance w/ pt.  He agrees w/ this plan.  Dr Gala Romney did not see a need for surgical consultation for hernia repair unless they become symptomatic.   Orders: Est. Patient Level III (00762)  Problem # 2:  ANEMIA (ICD-285.9) see #1.  On iron.  Problem # 3:  ABDOMINAL WALL HERNIA (ICD-553.20) Multiple, See #1  Patient Instructions: 1)  Please go get your labwork. 2)  Get a TB test at health dept and bring results to [place] 3)  Call if any problems 4)  We will need to start your medication once test results are back, if we are unable to reach you by phone, please call or come by the office in approx 10 days 5)  Begin calcium 128m and Vit D 400IU daily. 6)  The medication list was reviewed and reconciled.  All changed / newly prescribed medications were explained.  A complete medication list was provided to the patient / caregiver.     Appended Document: E15/ABD PAIN.GU Did pt get TB test & labwork?  Appended Document: E15/ABD PAIN.GU sent pt letter

## 2011-01-21 NOTE — Letter (Signed)
Summary: CONSULTATION NOTE  CONSULTATION NOTE   Imported By: Hoy Morn 07/24/2010 14:39:11  _____________________________________________________________________  External Attachment:    Type:   Image     Comment:   External Document

## 2011-01-21 NOTE — Letter (Signed)
Summary: Recall, Labs Needed  [facility]  [address]   [place], [place] [address]   Phone: [phone]  Fax: [phone]    July 19, 2010  Daniel Howard [place], [place]  [address] [birth year]   Dear [name],   This is the lab order that I left you a message about. Please take the enclosed form to the lab on or near the date indicated. I have also faxed a copy to the lab.   Please make note of the new location of the lab:   [place], 2nd floor   [place] office will call you within a week to ten business days with the results.  If you do not hear from [place] in 10 business days, you should call the office.  If you have any questions regarding this, call the office at [phone], and ask for the nurse.  Labs are due ASAP.   Sincerely,    Burnadette Peter LPN  [facility] Ph: [phone]   Fax: [phone]

## 2011-01-21 NOTE — Letter (Signed)
Summary: lab report-thiopurine metabolites  lab report-thiopurine metabolites   Imported By: Orma Flaming 06/10/2010 14:24:04  _____________________________________________________________________  External Attachment:    Type:   Image     Comment:   External Document

## 2011-01-21 NOTE — Letter (Signed)
Summary: MRCP ORDER  MRCP ORDER   Imported By: Sofie Rower 10/16/2010 08:44:52  _____________________________________________________________________  External Attachment:    Type:   Image     Comment:   External Document

## 2011-01-21 NOTE — Letter (Signed)
Summary: bone density report  bone density report   Imported By: Stephan Minister 01/28/2010 14:48:49  _____________________________________________________________________  External Attachment:    Type:   Image     Comment:   External Document

## 2011-01-21 NOTE — Letter (Signed)
Summary: Generic Letter, Intro to Referring  [facility]  [address]   [place], [place] [address]   Phone: [phone]  Fax: [phone]      April 19, 2010             RE: Daniel Howard   1956/11/24                 [place], [place]  [address]  Dear Mr. Corpus,  Your lab order for your bloodwork has already been sent to the [facility] (formerly [facility]) in [place]. Below are the dates that you must go to the lab and have blood drawn. This bloodwork is very important since you have started this new medication. The dates are:  04/26/10 05/03/10 05/10/10 05/17/10 05/31/10 06/14/10  Please mark these dates on your calender and keep this letter for reference. If you have any questions please call our office at [phone] and ask for the nurse.       Sincerely,    Burnadette Peter LPN  [facility] Ph: [phone]   Fax: [phone]

## 2011-01-21 NOTE — Progress Notes (Signed)
Summary: pt started meds  ---- Converted from flag ---- ---- 04/19/2010 10:12 AM, Sherry Ruffing wrote: Pt called to let you know he started the Meds we prescribed in order for him to have his bloodwork- cdg ------------------------------  Since pt just started meds today(04/19/10) I will redo letter for bloodwork and inform lab. Mailing letter to pt

## 2011-01-21 NOTE — Miscellaneous (Signed)
Summary: Orders Update  Clinical Lists Changes  Orders: Added new Test order of T-Hepatic Function [phone]) - Signed Added new Test order of T-Iron [phone]) - Signed Added new Test order of T-Folate (35075) - Signed Added new Test order of T-Ferritin [phone]) - Signed Added new Test order of T-B12, Serum Total Only (19802) - Signed Added new Test order of T-Iron Binding Capacity (TIBC) ([id number]) - Signed

## 2011-01-21 NOTE — Miscellaneous (Signed)
Summary: Orders Update  Clinical Lists Changes  Problems: Added new problem of ENCOUNTER FOR LONG-TERM USE OF OTHER MEDICATIONS (ICD-V58.69) Orders: Added new Test order of T-Comprehensive Metabolic Panel ([id number]) - Signed

## 2011-01-21 NOTE — Letter (Signed)
Summary: Normal Results Letter  [facility]  [address]   [place], [place] [address]   Phone: [phone]  Fax: [phone]    December 27, 2009  HONG MORING [address] [place], [place]  [address] July 28, 1956   Dear Mr. BREMER,   [place] office has been trying to contact you.  We just wanted to inform you that your bloodwork shows anemia and Dr. Gala Romney would like for you to do some additional bloodwork. I have enclosed the lab order. Please take it by the [facility] at the [facility] and have these done.  If you have any questions, please call the office at [phone].   Thank you,    Burnadette Peter, LPN Waldon Merl, [facility] Ph: [phone]   Fax: [phone]

## 2011-01-21 NOTE — Letter (Signed)
Summary: Normal Results Letter  [facility]  [address]   [name], [place] [address]   Phone: [phone]  Fax: [phone]    June 21, 2010  Daniel Howard [address] [place], [place]  [address] 12-26-1955   Dear [name],   [place] office has been trying to contact you.  We just wanted to inform you that all of your lab tests were normal.  You will be needing a follow up office visit soon, our office will be calling you to schedule that. If you have any questions, please call the office at [phone].   Thank you,    Burnadette Peter, LPN Waldon Merl, [facility] Ph: [phone]   Fax: [phone]   Appended Document: Normal Results Letter reminder in computer

## 2011-01-21 NOTE — Miscellaneous (Signed)
Summary: Orders Update  Clinical Lists Changes  Orders: Added new Test order of T-Hepatic Function ([id number]) - Signed 

## 2011-01-21 NOTE — Letter (Signed)
Summary: [facility] order  [facility] order   Imported By: Burnadette Peter LPN 07/12/8287 33:74:45  _____________________________________________________________________  External Attachment:    Type:   Image     Comment:   External Document

## 2011-01-21 NOTE — Progress Notes (Signed)
Summary: [facility] MRI   Phone Note Call from Patient   Summary of Call: pt's wife called. she wanted to know if we could [facility] MRI from 10/31 to another day due to transportation and financial issues. [phone] Initial call taken by: Zeb Comfort,  October 16, 2010 1:31 PM     Appended Document: [facility] MRI  Pt rescheduled to 10/22/10@9 :00am..Marland KitchenPts wife aware of appt.

## 2011-01-21 NOTE — Progress Notes (Signed)
Summary: TB test results  Phone Note Call from Patient Call back at Home Phone [phone]   Caller: Patient Summary of Call: pt came to office and brought his TB test results--which are negative.  Initial call taken by: Burnadette Peter LPN,  March 27, 1790 9:15 AM     Appended Document: TB test results Needs OV to discuss starting meds  Appended Document: TB test results Begin Imuran 64m daily #31, 1 rf.  Keep OV as planned.  Call if any fever or feeling poorly.  CBC in 1 week, then weekly x 3 more weeks, then q2weeks X 4, AND Met 7 & LFTs next week.  Keep appt as planned.  Appended Document: TB test results Will need thiopurine metabolites 4 weeks after starting and pt has OV scheduled.  Appended Document: TB test results pt and his wife aware, rx called to [facility].  Appended Document: TB test results spoke with CCaren Griffinsat lab, informed her what labs pt was due to have and when. she asked for lab orders to be sent to her and they would keep on file. will send letter to pt letting him know what dates to go to lab.

## 2011-01-21 NOTE — Letter (Signed)
Summary: letter to Dr Gala Romney  letter to Dr Gala Romney   Imported By: Hoy Morn 09/27/2010 16:30:14  _____________________________________________________________________  External Attachment:    Type:   Image     Comment:   External Document

## 2011-01-21 NOTE — Letter (Signed)
Summary: Generic Letter, Intro to Referring  [facility]  [address]   [place], [place] [address]   Phone: [phone]  Fax: [phone]      November 13, 2010             RE: Daniel Howard   November 28, 1956                 [address]                 [place], [place]  [address]   Medication taper:  Entorcort 12m tablet- take 2 daily untill January 1,2012  Then decrease to:  Entocort 362mtablet- take 1 daily untill mid February  Then stop medication.   Will need office visit in March 2012 with extender     [facility] Ph: [phone] Fax: [phone]

## 2011-01-21 NOTE — Letter (Signed)
Summary: TB test results  TB test results   Imported By: Burnadette Peter LPN 02/56/1548 84:57:33  _____________________________________________________________________  External Attachment:    Type:   Image     Comment:   External Document

## 2011-01-21 NOTE — Letter (Signed)
Summary: [facility]   Imported By: Hoy Morn 09/30/2010 10:20:15  _____________________________________________________________________  External Attachment:    Type:   Image     Comment:   External Document  Appended Document: [facility] 6-tgn now in tehrapuetic range - recommend continjue azothiaprine 126m dailoy , cbc and ov in 3 mos or as already scheduled  Appended Document: [facility] Pt informed. He has appt with LMagda Paganinion 10/15/2010.

## 2011-01-21 NOTE — Letter (Signed)
Summary: Recall, Labs Needed  [facility]  [address]   [place], [place] [address]   Phone: [phone]  Fax: [phone]    September 09, 2010  Daniel Howard [address] [place], [place]  [address] Jun 01, 1956   Dear Daniel Howard,   [place] records indicate it is time to repeat your blood work.  You can take the enclosed form to the lab on or near the date indicated.  Please make note of the new location of the lab:   [place], 2nd floor   [place] office will call you within a week to ten business days with the results.  If you do not hear from [place] in 10 business days, you should call the office.  If you have any questions regarding this, call the office at [phone], and ask for the nurse.  Labs are due on 10/09/2010.   Sincerely,    Burnadette Peter LPN  [facility] Ph: [phone]   Fax: [phone]

## 2011-01-21 NOTE — Progress Notes (Signed)
Summary: mrcp  ---- Converted from flag ---- ---- 10/23/2010 10:50 AM, Bridgette Habermann MD, Quentin Ore wrote: I think  repeat imaging /lfts in 6 mos and monitor for biliary symptoms  is also reasonable ---- 10/23/2010 8:23 AM, Laureen Ochs. Bernarda Caffey wrote: What do you think about MRCP findings? LFTs unremarkable. ?follow-up imaging in 6 months? ------------------------------  Please let pt know, biliary and pancreatic duct dilation stable to 1/11. Discussed with RMR.  Repeat MRCP in 6 months. Repeat LFTs in 6 months. Patient should let us know if has postprandial upper abd pain. Keep f/u as planned.  Appended Document: mrcp tried to call pt- call would not go through  Appended Document: mrcp pt called back informed of above. lab order on file  Appended Document: mrcp pt aware of appt on 11/23 @ 1030 w/RMR  reminders in computer about repeat MRCP and LFTs in 6 months/ss

## 2011-01-21 NOTE — Assessment & Plan Note (Signed)
Summary: FU OV,CROHNS/IDA/SS   Visit Type:  Follow-up Visit Primary Care Darlisha Kelm:  hasanje  Chief Complaint:  follow up- crohns- doing good.  History of Present Illness: Follow up small bowel Crohn's disease; status post multiple surgeries -  history of duodenal ulcer. Hospitaized  earlier this year with a flare;Entocort was used on top of azathioprine. . Azathioprine was bumped up to 100 mg daily based on the 6-thioguanine levels earlier in year. We tried to get a followup assay but the lab fouled it upt. 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 He does not take any nonsteroidal agents. 6-TG level 134 on 08/01/10; azothiaprine increased to 100 mg daily.  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 Take 1 Tablet By Mouth Once A Day 2)  Vicodin 5-500 Mg Tabs (Hydrocodone-Acetaminophen) .... As Needed 3)  Azathioprine 50 Mg Tabs (Azathioprine) .... 2 By Mouth Daily 4)  Calcium 1254m .... Once Daily  Allergies (verified): 1)  ! Pcn  Past History:  Past Medical History: Last updated: 01/25/2007 Anxiety Low back pain  Past Surgical History: Last updated: 12/03/2009 colostomy colectomy  Family History: Last updated: 010/10/10Father: Deceased age 314 Prostate cancer Mother: Living age 53 healthy Siblings: 178 One brother died aneurysm of brian One brother died  MI One sister died   unknown    Social History: Last updated: 010-Oct-2010Marital Status: Married Children: 8  Occupation    Disability Smokes cigarettes  3 cigs a day Alcohol   yes   beer occasionally Walks daily Patient currently smokes.  Alcohol Use - yes Patient gets regular exercise.  Risk Factors: Exercise: yes (010-09-2009  Risk Factors: Smoking Status: current (010-Oct-2010  Vital Signs:  Patient profile:   55year old male Height:      64 inches Weight:      142 pounds BMI:     24.46 Temp:     97.2 degrees F oral Pulse rate:   64 / minute BP sitting:   102 / 70  (left arm) Cuff size:   regular  Vitals Entered By: JBurnadette PeterLPN (September  6, 2370410:34 AM)  Physical Exam  General:  alert conversant gentleman come in by his wife Abdomen:  abdomen multiple surgical scars. 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 Would like to document his metabolite level. History of noncompliance not so much of a problem these days.  Recommendations: No nonsteroidal agents. Continue azathioprine 100 mg orally daily; given difficulties with mgt in the past, will plan to do a metabolite assay in 6 weeks to see where we stand.  Appended Document: FU OV,CROHNS/IDA/SS need [facility] metabolite assay in mid to late October this year  Appended Document: FU OV,CROHNS/IDA/SS Lab order on file.  Appended Document: Orders Update    Clinical Lists Changes  Orders: Added new Service order of Est. Patient Level III (79980) - Signed

## 2011-01-21 NOTE — Medication Information (Signed)
Summary: AZATHIOPRINE 50MG  AZATHIOPRINE 50MG   Imported By: Hoy Morn 11/18/2010 08:55:47  _____________________________________________________________________  External Attachment:    Type:   Image     Comment:   External Document  Appended Document: AZATHIOPRINE 50MG    Prescriptions: AZATHIOPRINE 50 MG TABS (AZATHIOPRINE) 2 by mouth daily  #62 x 2   Entered and Authorized by:   Andria Meuse FNP-BC   Signed by:   Andria Meuse FNP-BC on 11/18/2010   Method used:   Electronically to        [facility] (retail)       [address]       [place], [place]  [address]       Ph: [phone]       Fax: [phone]   RxID:   [id number]

## 2011-01-21 NOTE — Letter (Signed)
Summary: Internal Other/lab order  Internal Other/lab order   Imported By: Waldon Merl LPN 76/39/4320 03:79:44  _____________________________________________________________________  External Attachment:    Type:   Image     Comment:   External Document

## 2011-01-21 NOTE — Medication Information (Signed)
Summary: Visual merchandiser   Imported By: Orma Flaming 07/01/2010 14:09:05  _____________________________________________________________________  External Attachment:    Type:   Image     Comment:   External Document  Appended Document: RX Folder    Prescriptions: AZATHIOPRINE 50 MG TABS (AZATHIOPRINE) 1 and 1/2 by mouth daily  #45 x 5   Entered and Authorized by:   Andria Meuse FNP-BC   Signed by:   Andria Meuse FNP-BC on 07/01/2010   Method used:   Electronically to        [facility] (retail)       [address]       [place], [place]  [address]       Ph: [phone]       Fax: [phone]   RxID:   [id number]

## 2011-01-21 NOTE — Medication Information (Signed)
Summary: BUDESONIDE 3MG/24HR  BUDESONIDE 3MG/24HR   Imported By: Hoy Morn 11/18/2010 08:57:12  _____________________________________________________________________  External Attachment:    Type:   Image     Comment:   External Document  Appended Document: BUDESONIDE 3MG/24HR    Prescriptions: ENTOCORT EC 3 MG XR24H-CAP (BUDESONIDE) Two by mouth daily now until Dec 22, 2010, then drop to once daily until Feb 05, 2011, & stop  #110 x 0   Entered and Authorized by:   Andria Meuse FNP-BC   Signed by:   Andria Meuse FNP-BC on 11/18/2010   Method used:   Electronically to        [facility] (retail)       [address]       [place], [place]  [address]       Ph: [phone]       Fax: [phone]   RxID:   [id number]

## 2011-01-21 NOTE — Miscellaneous (Signed)
Summary: Orders Update  Clinical Lists Changes  Orders: Added new Test order of T-Basic Metabolic Panel ([id number]) - Signed Added new Test order of T-Hepatic Function [phone]) - Signed Added new Test order of T-CBC w/Diff [phone]) - Signed Added new Test order of T-CBC w/Diff ([id number]) - Signed Added new Test order of T-CBC w/Diff ([id number]) - Signed Added new Test order of T-CBC w/Diff ([id number]) - Signed Added new Test order of Prometheus-Mail (81859) - Signed Added new Test order of T-CBC w/Diff ([id number]) - Signed Added new Test order of T-CBC w/Diff ([id number]) - Signed

## 2011-01-21 NOTE — Letter (Signed)
Summary: Generic Letter, Intro to Referring  [facility]  [address]   [place], [place] [address]   Phone: [phone]  Fax: [phone]      April 04, 2010             RE: Daniel Howard   21-May-1956                 [place], [place]  [address]  Dear [name],  Your lab order for your bloodwork has already been sent to the [facility] lab ([facility]) in [place]. Below are the dates that you must go to the lab and have blood drawn. This bloodwork is very important since you have started this new medication. The dates are:  04/12/10 04/19/10 04/26/10 05/03/10 05/17/10 05/31/10  Please mark these dates on your calender and keep this letter for reference. If you have any questions please call our office at [phone] and ask for the nurse.       Sincerely,    Burnadette Peter LPN  [facility] Ph: [phone]   Fax: [phone]

## 2011-01-24 NOTE — Letter (Signed)
Summary: Internal Other /lab orders  Internal Other /lab orders   Imported By: Waldon Merl LPN 14/23/9532 02:33:43  _____________________________________________________________________  External Attachment:    Type:   Image     Comment:   External Document

## 2011-02-25 ENCOUNTER — Encounter: Payer: Self-pay | Admitting: Urgent Care

## 2011-02-27 ENCOUNTER — Encounter (INDEPENDENT_AMBULATORY_CARE_PROVIDER_SITE_OTHER): Payer: Self-pay | Admitting: *Deleted

## 2011-03-03 ENCOUNTER — Ambulatory Visit (INDEPENDENT_AMBULATORY_CARE_PROVIDER_SITE_OTHER): Payer: Medicare Other | Admitting: Urgent Care

## 2011-03-03 ENCOUNTER — Encounter: Payer: Self-pay | Admitting: Urgent Care

## 2011-03-03 ENCOUNTER — Other Ambulatory Visit: Payer: Self-pay | Admitting: Internal Medicine

## 2011-03-03 DIAGNOSIS — D649 Anemia, unspecified: Secondary | ICD-10-CM

## 2011-03-03 DIAGNOSIS — K8689 Other specified diseases of pancreas: Secondary | ICD-10-CM

## 2011-03-03 DIAGNOSIS — K5090 Crohn's disease, unspecified, without complications: Secondary | ICD-10-CM

## 2011-03-03 DIAGNOSIS — R935 Abnormal findings on diagnostic imaging of other abdominal regions, including retroperitoneum: Secondary | ICD-10-CM

## 2011-03-04 NOTE — Letter (Signed)
Summary: Recall Office Visit  [facility]  [address]   [place], [place] [address]   Phone: [phone]  Fax: [phone]      February 27, 2011   VESTAL MARKIN [place], [place]  [address] 1956/02/08   Dear Mr. GIAMBALVO,   According to our records, it is time for you to schedule a follow-up office visit with [place].   At your convenience, please call [phone] to schedule an office visit. If you have any questions, concerns, or feel that this letter is in error, we would appreciate your call.   Sincerely,    [facility] Ph: [phone]   Fax: [phone]

## 2011-03-06 ENCOUNTER — Encounter: Payer: Self-pay | Admitting: Gastroenterology

## 2011-03-06 ENCOUNTER — Ambulatory Visit (HOSPITAL_COMMUNITY)
Admission: RE | Admit: 2011-03-06 | Discharge: 2011-03-06 | Payer: Medicare Other | Source: Ambulatory Visit | Attending: Internal Medicine | Admitting: Internal Medicine

## 2011-03-06 LAB — CBC
HCT: 37.1 % — ABNORMAL LOW (ref 39.0–52.0)
Hemoglobin: 12.5 g/dL — ABNORMAL LOW (ref 13.0–17.0)
MCH: 32 pg (ref 26.0–34.0)
MCHC: 33.8 g/dL (ref 30.0–36.0)
MCV: 94.6 fL (ref 78.0–100.0)

## 2011-03-06 LAB — COMPREHENSIVE METABOLIC PANEL
ALT: <8 U/L (ref 0–53)
CO2: 29 mEq/L (ref 19–32)
Calcium: 9 mg/dL (ref 8.4–10.5)
Creatinine, Ser: 1.23 mg/dL (ref 0.4–1.5)
GFR calc non Af Amer: >60 mL/min (ref >60)
Glucose, Bld: 96 mg/dL (ref 70–99)

## 2011-03-06 LAB — URINALYSIS, ROUTINE W REFLEX MICROSCOPIC
Bilirubin Urine: NEGATIVE
Glucose, UA: NEGATIVE mg/dL
Hgb urine dipstick: NEGATIVE
Ketones, ur: NEGATIVE mg/dL
pH: 5.5 (ref 5.0–8.0)

## 2011-03-06 LAB — LIPASE, BLOOD: Lipase: 31 U/L (ref 11–59)

## 2011-03-06 LAB — DIFFERENTIAL
Eosinophils Absolute: 0.2 10*3/uL (ref 0.0–0.7)
Lymphs Abs: 1.3 10*3/uL (ref 0.7–4.0)
Neutrophils Relative %: 66 % (ref 43–77)

## 2011-03-08 LAB — DIFFERENTIAL
Basophils Relative: 0 % (ref 0–1)
Basophils Relative: 0 % (ref 0–1)
Eosinophils Absolute: 0.1 10*3/uL (ref 0.0–0.7)
Lymphs Abs: 0.7 10*3/uL (ref 0.7–4.0)
Lymphs Abs: 1.3 10*3/uL (ref 0.7–4.0)
Monocytes Relative: 5 % (ref 3–12)
Neutro Abs: 11.6 10*3/uL — ABNORMAL HIGH (ref 1.7–7.7)
Neutrophils Relative %: 81 % — ABNORMAL HIGH (ref 43–77)
Neutrophils Relative %: 89 % — ABNORMAL HIGH (ref 43–77)

## 2011-03-08 LAB — COMPREHENSIVE METABOLIC PANEL
ALT: 8 U/L (ref 0–53)
CO2: 24 mEq/L (ref 19–32)
Calcium: 8.8 mg/dL (ref 8.4–10.5)
Creatinine, Ser: 1.12 mg/dL (ref 0.4–1.5)
GFR calc non Af Amer: >60 mL/min (ref >60)
Glucose, Bld: 101 mg/dL — ABNORMAL HIGH (ref 70–99)
Sodium: 137 mEq/L (ref 135–145)

## 2011-03-08 LAB — CBC
HCT: 37.3 % — ABNORMAL LOW (ref 39.0–52.0)
Hemoglobin: 12.5 g/dL — ABNORMAL LOW (ref 13.0–17.0)
Hemoglobin: 12.8 g/dL — ABNORMAL LOW (ref 13.0–17.0)
MCH: 31.2 pg (ref 26.0–34.0)
MCHC: 33.6 g/dL (ref 30.0–36.0)
MCV: 92.7 fL (ref 78.0–100.0)
Platelets: 284 10*3/uL (ref 150–400)
RBC: 4.13 MIL/uL — ABNORMAL LOW (ref 4.22–5.81)

## 2011-03-08 LAB — MISCELLANEOUS TEST

## 2011-03-08 LAB — STOOL CULTURE

## 2011-03-08 LAB — BASIC METABOLIC PANEL
Calcium: 8.7 mg/dL (ref 8.4–10.5)
Calcium: 8.9 mg/dL (ref 8.4–10.5)
Creatinine, Ser: 1.32 mg/dL (ref 0.4–1.5)
GFR calc Af Amer: >60 mL/min (ref >60)
GFR calc Af Amer: >60 mL/min (ref >60)
GFR calc non Af Amer: 57 mL/min — ABNORMAL LOW (ref >60)
GFR calc non Af Amer: 58 mL/min — ABNORMAL LOW (ref >60)
Sodium: 138 mEq/L (ref 135–145)

## 2011-03-08 LAB — RAPID URINE DRUG SCREEN, HOSP PERFORMED: Cocaine: NOT DETECTED

## 2011-03-10 ENCOUNTER — Encounter: Payer: Self-pay | Admitting: Internal Medicine

## 2011-03-11 ENCOUNTER — Ambulatory Visit (HOSPITAL_COMMUNITY): Admission: RE | Admit: 2011-03-11 | Payer: Medicare Other | Source: Ambulatory Visit

## 2011-03-11 NOTE — Assessment & Plan Note (Signed)
Summary: FU OV W/EXTENDER IN MARCH 2012/CROHNS/SS   Vital Signs:  Patient profile:   55 year old male Height:      65.5 inches Weight:      144.25 pounds BMI:     23.72 Temp:     98.6 degrees F oral Pulse rate:   76 / minute BP sitting:   108 / 68  (left arm)  Vitals Entered By: Loney Loh LPN (March 02, 6221 97:98 AM)  Visit Type:  Follow-up Visit Primary Care Provider:  n/a   History of Present Illness: 55 y/o black male w/ hx Crohn's disease.  Occ twinges of pain RLQ few minutes.  BM changing bags 5-6 per day.  Noticed dark black stools about ago.  Denies nausea, vomiting, fever or chills.   Wt stable.  Appetite ok.  Finished entocort last.  Taking azathioprine 111m daily.  Also taking MVI and iron daily.  Notes talking Alka Seltzers as needed for episodes or abd pain.  Ostomy functioning very well.   Current Medications (verified): 1)  Multi Vitamin .... Take 1 Tablet By Mouth Once A Day 2)  Azathioprine 50 Mg Tabs (Azathioprine) .... 2 By Mouth Daily 3)  Calcium 12022m.... Once Daily 4)  Ferrous Sulfate 325 (65 Fe) Mg Tabs (Ferrous Sulfate) .... Two Tablets Daily 5)  Vicodin 5-500 Mg Tabs (Hydrocodone-Acetaminophen) ...[name] 1 By Mouth Q 4-6 Hrs As Needed Abd Pain  Allergies: 1)  ! Pcn  Past History:  Past Medical History: Anxiety Low back pain Crohn's biliary/pancreatic ductal dilation  Past Surgical History: Reviewed history from 12/03/2009 and no changes required. colostomy colectomy  Review of Systems General:  Denies fever, chills, sweats, anorexia, fatigue, weakness, malaise, weight loss, and sleep disorder. CV:  Denies chest pains, angina, palpitations, syncope, dyspnea on exertion, orthopnea, PND, peripheral edema, and claudication. Resp:  Denies dyspnea at rest, dyspnea with exercise, cough, sputum, wheezing, coughing up blood, and pleurisy. GI:  Denies difficulty swallowing, pain on swallowing, and jaundice. Derm:  Denies rash, itching, dry skin, hives,  moles, warts, and unhealing ulcers. Psych:  Denies depression, anxiety, memory loss, suicidal ideation, hallucinations, paranoia, phobia, and confusion. Heme:  Denies bruising, bleeding, and enlarged lymph nodes.  Physical Exam  General:  alert conversant appears comfortable at me by his wife Head:  Normocephalic and atraumatic. Eyes:  Sclera clear, no icterus. Mouth:  OP moist. Neck:  Supple; no masses or thyromegaly. Lungs:  clear to auscultation Heart:  Regular rate and rhythm; no murmurs, rubs,  or bruits. Abdomen:  nondistended positive bowel sounds ostomy right lower quadrant appears beefy red; he has keloid scar formation from multiple procedures previously.  without guarding and without rebound. Greenish-brown stool heme positive. Msk:  Symmetrical with no gross deformities. Normal posture. Pulses:  Normal pulses noted. Extremities:  Clubbing,  No edema Neurologic:  Alert and  oriented x4;  grossly normal neurologically. Skin:  Intact without significant lesions or rashes. Cervical Nodes:  No significant cervical adenopathy. Psych:  Alert and cooperative. Normal mood and affect.   Impression & Recommendations:  Problem # 1:  CROHN'S DISEASE (ICD-555.9) [age]/o black male w/ Crohn's disease.  Doing well except occ RLQ pain & has noticed dark stool in ostomy bag.  Heme positive.  On Iron.  Will recheck CBC, check Met 7 & LFTs STOP ALKA SELTZERS! Vicodin instead for pain  Orders: T-CBC w/Diff ([id number]-Hepatic Function ([id number]-Basic Metabolic Panel ([id number]. Patient Level III ([phone] Problem # 2:  NONSPECIFIC  ABN FINDING RAD & OTH EXAM GI TRACT (ICD-793.4) Biliary/pancreatic ductal dilation.  Needs FU MRCP May.  Patient Instructions: 1)  MRCP in MAY.  We will call to setup. 2)  Go get labs today 3)  Please schedule a follow-up appointment in 6 months. 4)  The medication list was reviewed and reconciled.  All changed / newly prescribed  medications were explained.  A complete medication list was provided to the patient / caregiver. Prescriptions: VICODIN 5-500 MG TABS (HYDROCODONE-ACETAMINOPHEN) 1 by mouth q 4-6 hrs as needed abd pain  #30 x 0   Entered and Authorized by:   Andria Meuse FNP-BC   Signed by:   Andria Meuse FNP-BC on 03/03/2011   Method used:   Print then Give to Patient   RxID:   [id number] VICODIN 5-500 MG TABS (HYDROCODONE-ACETAMINOPHEN) 1 by mouth q 4-6 hrs as needed abd pain  #30 x 0   Entered and Authorized by:   Andria Meuse FNP-BC   Signed by:   Andria Meuse FNP-BC on 03/03/2011   Method used:   Print then Give to Patient   RxID:   [id number]  ***Disregard 1st RX--NEVER PRINTED  Orders Added: 1)  T-CBC w/Diff [[id number]] 2)  T-Hepatic Function [[id number]] 3)  T-Basic Metabolic Panel [[id number]] 4)  Est. Patient Level III [33295]  Appended Document: FU OV W/EXTENDER IN MARCH 2012/CROHNS/SS 6 month f/u opv is in the computer

## 2011-03-11 NOTE — Miscellaneous (Signed)
Summary: Orders Update  Clinical Lists Changes  Problems: Added new problem of SCREENING FOR UNSPECIFIED CONDITION (ICD-V82.9) Orders: Added new Test order of T-Creatinine Blood [phone]) - Signed

## 2011-03-19 ENCOUNTER — Inpatient Hospital Stay (HOSPITAL_COMMUNITY)
Admission: RE | Admit: 2011-03-19 | Discharge: 2011-03-19 | Payer: Medicare Other | Source: Ambulatory Visit | Attending: Internal Medicine | Admitting: Internal Medicine

## 2011-03-20 NOTE — Letter (Signed)
Summary: RAD ORDER MR ABD W/WO CM  RAD ORDER MR ABD W/WO CM   Imported By: Hoy Morn 03/10/2011 14:40:13  _____________________________________________________________________  External Attachment:    Type:   Image     Comment:   External Document

## 2011-03-24 ENCOUNTER — Encounter: Payer: Self-pay | Admitting: Urgent Care

## 2011-03-24 NOTE — Progress Notes (Signed)
Marylou Mccoy, LPN << Less Detail     Marylou Mccoy, LPN       Sent: Fri March 21, 2011  4:22 PM    To: Vickey Huger, NP       WILFORD M[place]FIELD    MRN: [id number] DOB: Dec 25, 1955           Message     Pt does not have current phone number in chart. Mailed letter. ----- Message -----    From: Vickey Huger, NP    Sent: 03/14/2011   1:50 PM      To: Vickey Huger, NP, Marylou Mccoy, LPN  Please call pt to see if he has had labs done. Thanks

## 2011-03-26 ENCOUNTER — Other Ambulatory Visit: Payer: Self-pay | Admitting: Urgent Care

## 2011-03-27 LAB — CBC WITH DIFFERENTIAL/PLATELET
Eosinophils Absolute: 0.2 10*3/uL (ref 0.0–0.7)
Hemoglobin: 11.6 g/dL — ABNORMAL LOW (ref 13.0–17.0)
Lymphocytes Relative: 22 % (ref 12–46)
Lymphs Abs: 1.5 10*3/uL (ref 0.7–4.0)
MCH: 32.1 pg (ref 26.0–34.0)
MCV: 98.1 fL (ref 78.0–100.0)
Monocytes Relative: 5 % (ref 3–12)
Neutrophils Relative %: 69 % (ref 43–77)
RBC: 3.61 MIL/uL — ABNORMAL LOW (ref 4.22–5.81)
WBC: 7 10*3/uL (ref 4.0–10.5)

## 2011-03-27 LAB — BASIC METABOLIC PANEL
CO2: 26 mEq/L (ref 19–32)
Calcium: 8.8 mg/dL (ref 8.4–10.5)
Chloride: 104 mEq/L (ref 96–112)
Glucose, Bld: 120 mg/dL — ABNORMAL HIGH (ref 70–99)
Sodium: 139 mEq/L (ref 135–145)

## 2011-03-27 LAB — HEPATIC FUNCTION PANEL
ALT: <8 U/L (ref 0–53)
AST: 16 U/L (ref 0–37)
Albumin: 3.5 g/dL (ref 3.5–5.2)
Alkaline Phosphatase: 51 U/L (ref 39–117)
Indirect Bilirubin: 0.4 mg/dL (ref 0.0–0.9)
Total Protein: 7.3 g/dL (ref 6.0–8.3)

## 2011-03-31 LAB — HEMOGLOBIN AND HEMATOCRIT, BLOOD
HCT: 32.6 % — ABNORMAL LOW (ref 39.0–52.0)
Hemoglobin: 10.1 g/dL — ABNORMAL LOW (ref 13.0–17.0)
Hemoglobin: 11.1 g/dL — ABNORMAL LOW (ref 13.0–17.0)
Hemoglobin: 9 g/dL — ABNORMAL LOW (ref 13.0–17.0)

## 2011-03-31 LAB — BASIC METABOLIC PANEL
BUN: 6 mg/dL (ref 6–23)
CO2: 28 mEq/L (ref 19–32)
CO2: 29 mEq/L (ref 19–32)
Calcium: 8.1 mg/dL — ABNORMAL LOW (ref 8.4–10.5)
Calcium: 8.1 mg/dL — ABNORMAL LOW (ref 8.4–10.5)
Chloride: 106 mEq/L (ref 96–112)
Chloride: 106 mEq/L (ref 96–112)
Creatinine, Ser: 1.35 mg/dL (ref 0.4–1.5)
GFR calc Af Amer: >60 mL/min (ref >60)
Glucose, Bld: 85 mg/dL (ref 70–99)
Glucose, Bld: 86 mg/dL (ref 70–99)
Potassium: 3.9 mEq/L (ref 3.5–5.1)
Sodium: 139 mEq/L (ref 135–145)

## 2011-03-31 LAB — DIFFERENTIAL
Basophils Absolute: 0.1 10*3/uL (ref 0.0–0.1)
Basophils Relative: 1 % (ref 0–1)
Basophils Relative: 1 % (ref 0–1)
Eosinophils Absolute: 0.3 10*3/uL (ref 0.0–0.7)
Eosinophils Relative: 4 % (ref 0–5)
Eosinophils Relative: 4 % (ref 0–5)
Lymphs Abs: 2 10*3/uL (ref 0.7–4.0)
Monocytes Absolute: 0.5 10*3/uL (ref 0.1–1.0)
Monocytes Absolute: 0.5 10*3/uL (ref 0.1–1.0)
Monocytes Relative: 6 % (ref 3–12)
Monocytes Relative: 6 % (ref 3–12)
Neutro Abs: 5.8 10*3/uL (ref 1.7–7.7)
Neutrophils Relative %: 65 % (ref 43–77)

## 2011-03-31 LAB — PROTIME-INR: Prothrombin Time: 14.8 seconds (ref 11.6–15.2)

## 2011-03-31 LAB — CROSSMATCH: Antibody Screen: NEGATIVE

## 2011-03-31 LAB — CBC
Hemoglobin: 9.9 g/dL — ABNORMAL LOW (ref 13.0–17.0)
MCHC: 34.8 g/dL (ref 30.0–36.0)
MCHC: 35.1 g/dL (ref 30.0–36.0)
MCV: 92.7 fL (ref 78.0–100.0)
MCV: 93.8 fL (ref 78.0–100.0)
RBC: 3.06 MIL/uL — ABNORMAL LOW (ref 4.22–5.81)
RDW: 15.3 % (ref 11.5–15.5)
RDW: 15.5 % (ref 11.5–15.5)

## 2011-03-31 LAB — URINALYSIS, ROUTINE W REFLEX MICROSCOPIC
Bilirubin Urine: NEGATIVE
Hgb urine dipstick: NEGATIVE
Ketones, ur: NEGATIVE mg/dL
Nitrite: NEGATIVE
Protein, ur: NEGATIVE mg/dL
Urobilinogen, UA: 0.2 mg/dL (ref 0.0–1.0)

## 2011-03-31 LAB — COMPREHENSIVE METABOLIC PANEL
ALT: 8 U/L (ref 0–53)
AST: 17 U/L (ref 0–37)
CO2: 32 mEq/L (ref 19–32)
Calcium: 8.3 mg/dL — ABNORMAL LOW (ref 8.4–10.5)
Creatinine, Ser: 1.5 mg/dL (ref 0.4–1.5)
GFR calc Af Amer: 60 mL/min — ABNORMAL LOW (ref >60)
GFR calc non Af Amer: 49 mL/min — ABNORMAL LOW (ref >60)
Sodium: 139 mEq/L (ref 135–145)
Total Protein: 5.5 g/dL — ABNORMAL LOW (ref 6.0–8.3)

## 2011-03-31 LAB — LIPASE, BLOOD: Lipase: 25 U/L (ref 11–59)

## 2011-03-31 LAB — ABO/RH: ABO/RH(D): A POS

## 2011-03-31 LAB — LACTIC ACID, PLASMA: Lactic Acid, Venous: 0.9 mmol/L (ref 0.5–2.2)

## 2011-04-01 ENCOUNTER — Other Ambulatory Visit: Payer: Self-pay | Admitting: Urgent Care

## 2011-04-01 DIAGNOSIS — D649 Anemia, unspecified: Secondary | ICD-10-CM

## 2011-04-01 DIAGNOSIS — K5090 Crohn's disease, unspecified, without complications: Secondary | ICD-10-CM

## 2011-04-01 NOTE — Progress Notes (Signed)
He has no PCP

## 2011-04-14 ENCOUNTER — Other Ambulatory Visit: Payer: Self-pay

## 2011-04-15 MED ORDER — HYDROCODONE-ACETAMINOPHEN 5-325 MG PO TABS: 1.0000 | ORAL_TABLET | Freq: Four times a day (QID) | ORAL | Status: DC | PRN

## 2011-04-15 NOTE — Telephone Encounter (Signed)
rx faxed to [facility]

## 2011-04-23 ENCOUNTER — Other Ambulatory Visit: Payer: Self-pay | Admitting: Gastroenterology

## 2011-04-23 LAB — HEPATIC FUNCTION PANEL
ALT: <8 U/L (ref 0–53)
AST: 18 U/L (ref 0–37)
Albumin: 3.8 g/dL (ref 3.5–5.2)
Alkaline Phosphatase: 56 U/L (ref 39–117)
Total Bilirubin: 0.8 mg/dL (ref 0.3–1.2)
Total Protein: 7.7 g/dL (ref 6.0–8.3)

## 2011-04-28 ENCOUNTER — Ambulatory Visit (INDEPENDENT_AMBULATORY_CARE_PROVIDER_SITE_OTHER): Payer: Medicare Other | Admitting: Urgent Care

## 2011-04-28 ENCOUNTER — Encounter: Payer: Self-pay | Admitting: Urgent Care

## 2011-04-28 VITALS — BP 104/65 | HR 69 | Temp 97.0°F | Ht 64.0 in | Wt 137.4 lb

## 2011-04-28 DIAGNOSIS — K5090 Crohn's disease, unspecified, without complications: Secondary | ICD-10-CM

## 2011-04-28 DIAGNOSIS — D649 Anemia, unspecified: Secondary | ICD-10-CM

## 2011-04-28 DIAGNOSIS — R935 Abnormal findings on diagnostic imaging of other abdominal regions, including retroperitoneum: Secondary | ICD-10-CM

## 2011-04-28 DIAGNOSIS — R634 Abnormal weight loss: Secondary | ICD-10-CM

## 2011-04-28 MED ORDER — INTEGRA PLUS PO CAPS: 1.0000 | ORAL_CAPSULE | Freq: Every day | ORAL | Status: DC

## 2011-04-28 NOTE — Assessment & Plan Note (Signed)
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 He has been on azathioprine since spring of 2011.  Recent lab work shows a mild anemia (hgb 11.6).  He has had significant weight loss which is concerning.  He has history of noncompliance, but  Has been returning regularly to the clinic for follow-up.  I suspect he has active SB Crohn's disease.

## 2011-04-28 NOTE — Progress Notes (Signed)
Primary Care Physician:  Tula Nakayama, MD, MD Primary Gastroenterologist:  Dr. Gala Romney  Chief Complaint  Patient presents with  . Follow-up    Crohn's    HPI:  Daniel Howard is a 55 y.o. male here for follow up for small bowel Crohn's disease & hx dilated biliary/pancreatic ducts on MRCP.  Weight down 7# in past 6 months.  Denies anorexia.  Eats 2  Good meals/day.  Few snacks.  Empties ostomy bag 3-4 times per day.  Chronic dark stools, no bright red blood.  Denies fever, chills, nausea or vomiting.  Denies dysphagia or odynophagia.  On Imuran 137m daily.  Taking Integra Plus Iron daily & MVI.  Due for MRCP to FU on pancreatic/biliary ductal dilation.    LFTs normal, Hct 35.4, WBC 7, plts 347.  Met 7 normal except glucose 120.   Past Medical History  Diagnosis Date  . Anxiety   . Low back pain   . 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 Abnormal finding of biliary tract     MRCP shows pancreatic/biliary tract dilation  . Spigelian hernia     Past Surgical History  Procedure Date  . Colostomy   . Colectomy   . Hernia repair     incarcerated periostial hernia    Current Outpatient Prescriptions  Medication Sig Dispense Refill  . azaTHIOprine (IMURAN) 50 MG tablet Take 50 mg by mouth daily. 2 BY MOUTH DAILY       . Calcium Carbonate-Vit D-Min (CALCIUM 1200 PO) Take 1,200 mg by mouth 1 dose over 46 hours.        . FeFum-FePoly-FA-B Cmp-C-Biot (INTEGRA PLUS) CAPS Take 1 capsule by mouth daily.  30 capsule  5  . HYDROcodone-acetaminophen (NORCO) 5-325 MG per tablet Take 1 tablet by mouth every 6 (six) hours as needed.  30 tablet  0  . Multiple Vitamin (MULTIVITAMIN) tablet Take 1 tablet by mouth daily.        .Marland KitchenDISCONTD: FeFum-FePoly-FA-B Cmp-C-Biot (INTEGRA PLUS) CAPS Take 1 capsule by mouth daily.        .Marland KitchenDISCONTD:  ferrous sulfate 325 (65 FE) MG tablet Take 325 mg by mouth daily with breakfast.          Allergies as of 04/28/2011 - Review Complete 04/28/2011  Allergen Reaction Noted  . Penicillins  01/25/2007   Family History:  There is no known family history of colorectal carcinoma , liver disease, or inflammatory bowel disease.  History   Social History  . Marital Status: Married    Spouse Name: [name]    Number of Children: 975 . Years of Education: [name]   Occupational History  . disabled    Social History Main Topics  . Smoking status: Former Smoker -- 0.5 packs/day for 2 years    Types: Cigarettes    Quit date: 12/21/2009  . Smokeless tobacco: Not on file  . Alcohol Use: No  . Drug Use: No  . Sexually Active: Yes -- Male partner(s)   Review of Systems: Gen: Denies any fever, chills, sweats, anorexia, fatigue, weakness, malaise, weight loss, and sleep disorder CV: Denies chest pain, angina, palpitations, syncope, orthopnea, PND, peripheral edema, and claudication. Resp: Denies dyspnea at rest, dyspnea with exercise, cough, sputum, wheezing, coughing up blood, and pleurisy. GI: Denies vomiting blood, jaundice, and fecal incontinence.   Denies dysphagia or  odynophagia. Derm: Denies rash, itching, dry skin, hives, moles, warts, or unhealing ulcers.  Psych: Denies depression, anxiety, memory loss, suicidal ideation, hallucinations, paranoia, and confusion. Heme: Denies bruising, bleeding, and enlarged lymph nodes.  Physical Exam: BP 104/65  Pulse 69  Temp(Src) 97 F (36.1 C) (Tympanic)  Ht 5' 4"  (1.626 m)  Wt 137 lb 6.4 oz (62.324 kg)  BMI 23.58 kg/m2 General:   Alert,  Well-developed, thin, pleasant and cooperative in NAD Head:  Normocephalic and atraumatic. Eyes:  Sclera clear, no icterus.   Conjunctiva pink. Mouth:  No deformity or lesions, dentition normal. Neck:  Supple; no masses or thyromegaly. Heart:  Regular rate and rhythm; no murmurs, clicks, rubs,  or  gallops. Abdomen:  Soft, nontender and nondistended. No masses, hepatosplenomegaly.  Ostomy beefy red & protruding w/ chronic herniation, non-tender.  Dark brown stool in ostomy bag.  Normal bowel sounds, without guarding, and without rebound.  Msk:  Symmetrical without gross deformities. Normal posture. Pulses:  Normal pulses noted. Extremities:  Clubbing.  No edema. Neurologic:  Alert and  oriented x4;  grossly normal neurologically. Skin:  Intact without significant lesions or rashes. Cervical Nodes:  No significant cervical adenopathy. Psych:  Alert and cooperative. Normal mood and affect.

## 2011-04-28 NOTE — Assessment & Plan Note (Signed)
Biliary/pancreatic ductal dilation w/ normal LFTs on MRCP last year.  Due for FU to assess stability.

## 2011-04-28 NOTE — Progress Notes (Signed)
Cc to PCP 

## 2011-04-28 NOTE — Assessment & Plan Note (Signed)
Mild, suspect secondary to poorly-controlled Crohn's disease w/ chronic slow GI blood loss.  On integra & mvi.

## 2011-04-28 NOTE — Assessment & Plan Note (Addendum)
Unintentional weight loss, suspect malabsorption, but do need to r/o malignancy.    Eat 3 meals/day and at least 2 snacks Avoid milk products below if they bother you Continue Imuran Continue vitamins & iron We will call with MRI results High calorie/protein diet

## 2011-04-28 NOTE — Patient Instructions (Signed)
Eat 3 meals/day and at least 2 snacks Avoid milk products below if they bother you Continue Imuran Continue vitamins & iron We will call with MRI results   High Protein/High Calorie Diet A high protein/high calorie diet increases the amount of protein and calories in the foods you eat. You may need more protein and calories in your diet because of illness, surgery, injury, weight loss, or have a poor appetite. Eating protein and calorie rich foods can help you gain weight, heal and recover after illness.  SERVING SIZES: Measuring foods and serving sizes helps to make sure you are getting the right amount of food. The list below tells how big or small some common serving sizes are.   1 ounce (oz) of cheese or 1  oz cheese 3 or 4 stacked dice   2-3 oz cooked meat Deck of cards   1 teaspoon (tsp) Tip of little finger   1 tablespoon (Tbsp) Tip of thumb   2 Tbsp Golf ball    Cup Half of a fist   1 Cup A fist  HIGH PROTEIN FOODS: Dairy Sources  Whole milk.  Whole milk yogurt.   Powdered milk.   Cheese.  Yahoo.   Instant breakfast products.  Eggnog.   Tips for adding to/using in diet:  Use whole milk when making hot cereal, puddings, soups and hot cocoa.   Add powdered milk to baked goods and smoothies or milkshakes.   Make whole milk yogurt parfaits by adding granola, fruit or nuts.   Add cheese to sandwiches, pastas, soups and casseroles.   Add fruit to cottage cheese.  Meat Sources  Beef, pork and poultry.  Fish and seafood.   Peanut butter.   Dried beans.  Eggs.   Tips for adding to/using in diet:  Make meat and cheese omelets   Add eggs to salads and baked goods   Add fish and seafood to salads   Add meat and poultry to casseroles, salads, and soups   Use peanut butter as a topping for pretzels, celery, crackers or add to baked goods   Use beans in casseroles and dips or spreads  GENERAL GUIDELINES TO INCREASE CALORIES:  Replace  calorie free drinks with calorie containing drinks such as milk, fruit juices, regular soda, milkshakes, and hot chocolate.   Try to eat 6 small meals instead of 3 large meals each day.   Keep snacks handy such as nuts, trail mixes, dried fruit, and yogurt.   Choose foods with sauces and gravies to increase calories.   Add dried fruits, honey, and half and half to hot or cold cereal.   Add extra fats when possible such as butter, sour cream, cream cheese, and salad dressings   Sprinkle or add cheese to foods often.   Consider adding a clear liquid nutritional supplement to your diet. Your caregiver can give you recommendations.  HIGH CALORIE FOODS: Grain/Starch Sources  Baked goods such as muffins and quick breads.  Croissants.   Pancakes and waffles.  Vegetable Sources  Sauted vegetables in oil.   Fried vegetables.   Salad greens with regular salad dressing or vinegar and oil.  Fruit Sources  Dried fruit.  Canned fruit in syrup.  Fruit juice.  Fat Sources  Avocado.  Butter or margarine.   Whipped cream.   Mayonnaise.  Salad dressing.   Peanuts and mixed nuts.  Cream cheese and sour cream.   Sweets and Dessert Sources  Cake.  Cookies.   Pie.  Ice cream.   Donuts and pastries.   Protein and meal replacement bars.  Jam, preserves and jelly.   Candy bars.   Chocolate.   Chocolate, caramel, or various syrups.   Document Released: 12/08/2005 Document Re-Released: 10/04/2009 [facility] Patient Information 2011 [place].

## 2011-05-01 ENCOUNTER — Ambulatory Visit (HOSPITAL_COMMUNITY): Admission: RE | Admit: 2011-05-01 | Payer: Medicare Other | Source: Ambulatory Visit

## 2011-05-01 ENCOUNTER — Other Ambulatory Visit: Payer: Self-pay

## 2011-05-01 DIAGNOSIS — K5090 Crohn's disease, unspecified, without complications: Secondary | ICD-10-CM

## 2011-05-02 MED ORDER — AZATHIOPRINE 50 MG PO TABS: ORAL_TABLET | ORAL | Status: DC

## 2011-05-02 NOTE — Telephone Encounter (Signed)
Pharmacy has rx and pts wife is aware

## 2011-05-06 ENCOUNTER — Ambulatory Visit (HOSPITAL_COMMUNITY)
Admission: RE | Admit: 2011-05-06 | Discharge: 2011-05-06 | Disposition: A | Discharge status: Home / Self Care | Payer: Medicare Other | Source: Ambulatory Visit | Attending: Internal Medicine | Admitting: Internal Medicine

## 2011-05-06 ENCOUNTER — Other Ambulatory Visit: Payer: Self-pay | Admitting: Internal Medicine

## 2011-05-06 DIAGNOSIS — R599 Enlarged lymph nodes, unspecified: Secondary | ICD-10-CM | POA: Insufficient documentation

## 2011-05-06 DIAGNOSIS — R932 Abnormal findings on diagnostic imaging of liver and biliary tract: Secondary | ICD-10-CM | POA: Insufficient documentation

## 2011-05-06 DIAGNOSIS — K5090 Crohn's disease, unspecified, without complications: Secondary | ICD-10-CM

## 2011-05-06 DIAGNOSIS — K8689 Other specified diseases of pancreas: Secondary | ICD-10-CM

## 2011-05-06 DIAGNOSIS — R109 Unspecified abdominal pain: Secondary | ICD-10-CM | POA: Insufficient documentation

## 2011-05-06 DIAGNOSIS — R52 Pain, unspecified: Secondary | ICD-10-CM

## 2011-05-06 DIAGNOSIS — R634 Abnormal weight loss: Secondary | ICD-10-CM | POA: Insufficient documentation

## 2011-05-06 MED ORDER — GADOBENATE DIMEGLUMINE 529 MG/ML IV SOLN
12.0000 mL | Freq: Once | INTRAVENOUS | Status: AC | PRN
  Administered 2011-05-06: 12 mL via INTRAVENOUS

## 2011-05-06 NOTE — H&P (Signed)
NAME:  Daniel Howard, Daniel Howard               ACCOUNT NO.:  [id number]   MEDICAL RECORD NO.:  [id number]          PATIENT TYPE:  INP   LOCATION:  A313                          FACILITY:  [facility]   PHYSICIAN:  Audria Nine, M.D.DATE OF BIRTH:  [birth year]   DATE OF ADMISSION:  05/31/2009  DATE OF DISCHARGE:  LH                              HISTORY & PHYSICAL   ADMISSION DIAGNOSES:  1. Acute abdominal pain.  2. Acute exacerbation of Crohn disease.  3. Gastrointestinal bleed with melanotic stool.  4. Severe anemia, likely a combination of anemia of chronic disease      from gastrointestinal bleed.   CHIEF COMPLAINT:  Abdominal pain and dark stools through his colostomy  bag.   HISTORY OF PRESENT ILLNESS:  Daniel Howard is a 55 year old male with a  history of Crohn disease which was diagnosed over 24 years ago.  The  patient is status post right hemicolectomy with an ileostomy.  The  patient has had the surgery over the past 25 years.  He was diagnosed  with Crohn disease since the age of 74, and this progressed to the point  where he required the surgery mentioned above.  The patient does not  take any medications for his Crohn disease and has a history of very  poor compliance.   The patient presented to the emergency room today with some abdominal  pain of several days, about 3-4 days' duration, mostly in the right  upper quadrant extending to the middle of his abdomen.  He denies any  fevers or chills, any nausea or vomiting.  He also noted some black  stools through the colostomy bag.   The patient was seen in the emergency room, and even though his  abdominal exam was unremarkable, he did have a fair amount of anemia,  and blood transfusion was started.  The patient has had extensive  abdominal surgeries in the past.   REVIEW OF SYSTEMS:  A 10-point review of systems is negative except as  mentioned in history of present illness above.  The patient denies any  weight loss.   PAST  MEDICAL HISTORY:  1. Crohn disease diagnosed at the age of 44.  2. Status post right hemicolectomy and ileostomy about 25 years ago.  3. History of multiple abdominal surgeries including a hernia repair      several years ago.  4. History of incision and drainage of a perirectal abscess in 1988      and in 1999.  5. Status post appendectomy in 1992.  6. History of prolapse __________ surgery with subsequent resection of      the transverse colon and revision of his colostomy and transverse      ostomy and revision of his end transverse ostomy, status post      current relapse.  7. History of incarcerated periosteal hernia requiring additional      surgery in 1996.  Most of these surgeries were performed by Dr.      Anthony Sar.   HOME MEDICATIONS:  The patient is on:  1. Hycodan  6 hours as needed.  2.  Cipro 500 mg p.o. b.i.d. for a presumed bronchitis.   ALLERGIES:  Report suggesting allergy to PENICILLIN, but the patient is  not aware of this.   SOCIAL HISTORY:  The patient is married for about 7 years.  He smokes  about a pack of cigarettes a day with occasional drinking and occasional  use of cocaine.  Per the patient's history, he has not used this in a  while now.   FAMILY HISTORY:  Not known, other than father dying of cancer, possibly  prostate cancer.  The patient has about 8 children.   PHYSICAL EXAMINATION:  The patient was conscious, alert, comfortable,  not in acute distress.  Was well oriented in time, place and person.  He  was somewhat thin.  VITAL SIGNS:  Blood pressure was 92/60 with a pulse of 63, respirations  20, temperature 97.4 degrees Fahrenheit, oxygen saturation was 96% on  room air.  HEENT EXAM:  Normocephalic, atraumatic.  Oral mucosa was dry.  No  exudates were noted.  NECK:  Supple.  No JVD, lymphadenopathy.  LUNGS:  Were clear clinically with good air entry bilaterally.  HEART:  S1-S2 regular.  No S3, S4, gallops or rubs.  ABDOMEN:  Was soft,  scaphoid.  The patient has extensive scars from his  multiple exploratory laparotomies and abdominal surgeries.  He also has  an ileostomy bag which was draining mostly brownish stool.  There was no  melena, stool or blood seen.  There was no tenderness or rebound his  belly.  EXTREMITIES:  No pitting pedal edema.  No calf induration or tenderness  was noted.  CENTRAL NERVOUS SYSTEM EXAM:  Was grossly intact.  No focal neurological  deficits.   LABORATORY/DIAGNOSTIC DATA:  White blood cell count was 8.3, hemoglobin  of 7.4, hematocrit of 21.2, platelet count was 297.  Sodium 139,  potassium 3.5, chloride 104, CO2 32, glucose 95, BUN eight, creatinine  1.50.  AST 17, ALT of 8.  Calcium 8.3.  Lactic acid 0.9.  Lipase was 25.  Urinalysis showed a high specific gravity, otherwise was unremarkable.   CT scan of the abdomen and pelvis revealed a small amount of fluid  around the liver, mild common bile duct and pancreatic ductal  dilatation, and a right lower quadrant colostomy and a left lower  quadrant abdominal wall hernia containing some colon.  This findings  were reported to be stable.  There was no evidence of small-bowel  obstruction.  CT scan of the pelvis showed thickened distal small bowel  loops near terminal ileum suggesting recurrent Crohn disease.  There  were no findings obstruction or perforation, though.   ASSESSMENT/PLAN:  Daniel Howard is a 55 year old male who presented to the  emergency room with acute abdominal pain, some melenic stools and severe  anemia.  CT scan suggestive of recurrent of Crohn disease.  The patient  likely has an acute exacerbation of his Crohn disease.   PLAN:  1. The patient will be admitted to the medical floor.  2. I agree with blood transfusions at this time.  3. Will initiate antibiotic therapy with Cipro and Flagyl for his      recurrent exacerbation of his Crohn.  4. Will request Dr. Gala Romney, the gastroenterologist, to evaluate the       patient this morning.  5. The patient is hemodynamically stable with no indication for      aggressive ICU care at this time.  6. The patient will likely need to reestablish  long-term care with a      gastroenterologist, although this can be addressed.  7. Brief counseling was done on smoking cessation.     I have explained the above plan to the wife and to the patient in detail  who verbalized full understanding.  I will defer subsequent management  of Crohn disease to the gastroenterologist at this time.  Will control  his pain and hydrate him as needed.      Audria Nine, M.D.  Electronically Signed     AM/MEDQ  D:  06/01/2009  T:  06/01/2009  Job:  [id number]

## 2011-05-06 NOTE — Op Note (Signed)
NAME:  Daniel Howard, Daniel Howard               ACCOUNT NO.:  [id number]   MEDICAL RECORD NO.:  [id number]          PATIENT TYPE:  INP   LOCATION:  A313                          FACILITY:  [facility]   PHYSICIAN:  Caro Hight, M.D.      DATE OF BIRTH:  Dec 20, 1956   DATE OF PROCEDURE:  06/02/2009  DATE OF DISCHARGE:                               OPERATIVE REPORT   REFERRING PHYSICIAN:  Bonnielee Haff, MD   PROCEDURE:  Esophagogastroduodenoscopy with cold forceps biopsy.   INDICATION FOR EXAM:  Daniel Howard is a 55 year old male with Crohn's  disease who was taking anti-inflammatory drugs.  He presented with  melena.  He stopped using alcohol and crack approximately over a month  ago.   FINDINGS:  1. Normal esophagus without evidence of Barrett's, mass, erosion,      ulceration or stricture.  2. Multiple superficial ulcers seen in the antrum.  Biopsies obtained      via cold forceps to evaluate for Helicobacter pylori gastritis.      The ulcers were linear and none were larger than 6 mm.  3. Postsurgical changes in the duodenal bulb and second portion of the      duodenum.  The ampulla was normal.  He had a large ulcer seen at      the anastomosis which had no evidence of visible vessel or adherent      clot.  4. No old blood or fresh blood seen in the stomach or the duodenum.   DIAGNOSES:  1. Large duodenal ulcer likely the cause for melena.  2. Gastric erosion/ulcers, biopsies pending for Helicobacter pylori.   RECOMMENDATIONS:  1. I have given Daniel Howard a card to call for the results of his      biopsies.  He does not have a phone.  2. Follow up in 1 month with Dr. Garfield Cornea.  3. Advance his diet.  4. If his hemoglobin remains stable he may be discharged in the      morning.  5. Proton pump inhibitor b.i.d. for 3 months.  6. He should avoid aspirin and anti-inflammatory drugs for 30 days.      No anticoagulation for 14 days.  7. Okay to discontinue the use of Cipro.  Will begin Asacol  two p.o. 3      times a day to manage his Crohn's disease.  He has evidence of      active disease on the CT scan in the distal small bowel.   MEDICATIONS:  1. Demerol 75 mg IV.  2. Versed 4 mg IV.   PROCEDURE TECHNIQUE:  Physical exam was performed.  Informed consent was  obtained from the patient after explaining the benefits, risks and  alternatives to the procedure.  The patient was connected to the monitor  and placed in left lateral position.  Continuous oxygen was provided by  nasal cannula and IV medicine administered through an indwelling  cannula.  After administration of sedation, the patient's esophagus was  intubated and the scope was advanced under direct visualization to the  second portion of the duodenum.  The scope was removed slowly by  carefully examining the color, texture, anatomy and integrity of the  mucosa on the way out.  The patient was recovered in endoscopy and  discharged to the floor in satisfactory condition.   PATH:  NSAID gastritis      Caro Hight, M.D.  Electronically Signed     SM/MEDQ  D:  06/02/2009  T:  06/02/2009  Job:  [id number]   cc:   Stoney Bang  Fax: [phone]

## 2011-05-06 NOTE — Consult Note (Signed)
NAME:  Daniel Howard, Daniel Howard               ACCOUNT NO.:  [id number]   MEDICAL RECORD NO.:  [id number]          PATIENT TYPE:  INP   LOCATION:  A313                          FACILITY:  [facility]   PHYSICIAN:  R. Garfield Cornea, M.D. DATE OF BIRTH:  1956/07/11   DATE OF CONSULTATION:  06/01/2009  DATE OF DISCHARGE:                                 CONSULTATION   REASON FOR CONSULTATION:  Melena, a drop in hemoglobin and abdominal  pain.   HISTORY OF PRESENT ILLNESS:  Daniel Howard is a 55 year old African  American male with a long history of complicated Crohn's disease and  noncompliance, who presented to the emergency department with some  nonspecific abdominal pain yesterday.  He states he had it for three to  four days.  It was predominantly right upper quadrant pain, without any  nausea or vomiting.  He has also had some black tarry stools coming  through his ileostomy bag   ADMISSION LABORATORY DATA:  Included a CBC which revealed an H&H of 7.4  and 21.2 respectively, with an MCV of 93.8.  After 2 units of packed red  blood cells, it has come up to 9.0 and 26.0.  His abdominal pain has  resolved.  Upon admission he underwent an evaluation which included a CT  scan, which I reviewed with  Dr. Rozetta Howard.  He has numerous  abnormalities.  He has a left Spigelian hernia with quite a bit of colon  within it, which seems to have gotten worse, in comparison to the prior  CT.  He also has a smaller right Spigelian hernia with a knuckle of  bowel within it; however, he has a larger peristomal hernia on the right  side, with some associated bowel edema, with bowel entering it.  There  appears to be no obstruction.  He has a mildly dilated pancreatic duct,  common bile duct, and the gallbladder remains in situ.  No evidence for  abscess.  There is no evidence for a small bowel obstruction.  There is  some thickening in the area of the neoterminal ileum, with the  anastomosis with the colon,  suggestive of Crohn's disease.   This gentleman has not been seen by a gastroenterologist since at least  2007.  He previously saw Dr. Hildred Howard, and he was in the process of  working him up for iron deficiency anemia and was planning a  sigmoidoscopy and EGD; however, this never apparently happened.  He has  seen multiple physicians.  More recently he is seeing Dr. Stoney Howard  in [place].  He is on no Crohn's regimen.  He readily admits to taking  Aleve and Bayer aspirin on a regular basis for various aches and pains.  He does not smoke.  He was a regular consumer of beer until one month  ago, when he states he stopped smoking and drinking alcohol.  He has not  had any hematochezia.  He has not had any hematemesis.  He denies reflux  symptoms, odynophagia or dysphagia.   ADDITIONAL LABORATORY DATA ON ADMISSION:  Revealed a normal lipase at  25  and normal LFTs.   He is having no further abdominal pain.  His past GI history is notable  for Crohn disease, which was diagnosed at age 65, status post right  hemicolectomy.  He has had a multitude of complications along the way,  including what appears to be an incarcerated hernia requiring surgery  over in [place], [place].  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  A sigmoidoscopy back in 1980, by Dr.  Laural Howard, suggested Crohn's disease, but biopsies were not confirmatory,  status post  incidental appendectomy. Again, he has not been on any form  of treatment for Crohn's disease, at least as far as I can tell and  based on patient interview for many years.   PAST MEDICAL HISTORY:  Significant for Crohn disease, as described  above.  She may have a history of an element of Crohn's colitis with his  numerous complications as described above.   MEDICATIONS ON ADMISSION:  1. Vicodin.  2. Cipro reportedly for bronchitis.  3. Aleve.   4. Bayer aspirin.   ALLERGIES:  PENICILLIN.   FAMILY HISTORY:  Negative chronic GI or liver illness.  Father had some  type of cancer, but not specifically GI   SOCIAL HISTORY:  The patient has been married for seven years.  He has  been married three times.  He stopped smoking and drinking alcohol one  month ago, and denies illicit drug use to me.  He has eight children.   REVIEW OF SYSTEMS:  As in the history of present illness.   PHYSICAL EXAMINATION:  GENERAL:  Reveals a chronically ill gentleman,  who is alert and conversant, in no acute distress. VITAL SIGNS:  Temperature 97.5 degrees, pulse 56, respiratory 15, BP 97/66.  HEENT:  No scleral icterus.  Conjunctivae are somewhat pale.  CHEST:  Lungs are clear to auscultation.  HEART:  Regular rate and rhythm without murmur or rub.  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  Multiple  abdominal scars with keloid formation.  He has positive bowel sounds.  Soft abdomen, is really nontender to palpation. I do appreciate bowel  just under the skin on the left abdominal wall, consistent with a large  spigelian hernia noted on CT.  He specifically does not have any right  abdominal tenderness whatsoever.  EXTREMITIES:  Have no edema.   ADDITIONAL LABORATORY DATA:  White count 8.3 on admission, platelet  count 297,000.  Sodium 139, potassium 3.5, chloride 104, CO2 of 32,  glucose 95, BUN 8, creatinine 1.5.  Alkaline phosphatase 48, total  bilirubin 0.3, AST 17, ALT 8, total protein 5.5, albumin 2.5, calcium  8.3, lipase 25.  Pro-time 1.1.   IMPRESSION:  1. Mr. Daniel Howard is a 55 year old African American male, admitted      to the hospital with a self-limiting right-sided abdominal pain of      a few days' duration, along with melena and a notable drop in his      hemoglobin, requiring transfusion.  He has a long, long history of      complicated Crohn's disease, and a long, long history of      noncompliance.  He  appears to have very poor insight into his      medical problems and is currently not on any specific medications      for Crohn's, and  has not seen a gastroenterologist in at least      three years.  Even when he did see Daniel Howard previously, he was      quite noncompliant.  I am concerned about his NSAID's/ASA use in      the setting of Crohn's disease.  He certainly could have any NSAID-      induced ulcer or bleeding elsewhere in his GI tract from      nonsteroidal agents.  2. He has multiple abdominal wall hernias, a left Spigelian hernia      which has gotten somewhat bigger than seen on the prior CT scan.      He also has a small right one.  I am somewhat concerned about a      parastomal hernia, with some edematous bowel contained within it,      although there does not appear to be an obstruction or      incarceration.  I suspect he has had some symptoms recently related      to this abnormality.  3. He also has mild dilation of his common bile duct and pancreatic      duct, of uncertain significance.   RECOMMENDATIONS:  1. I certainly agree with your approach of transfusing with white      blood cells, as appropriate.  2. Agree with a PPI therapy empirically.  3. Mr. Marti needs an EGD.  I have recommended this to him.  I have      discussed the risks, benefits and alternatives and limitations with      the patient.  His questions were answered and he is agreeable and      understands. I will schedule this procedure for tomorrow morning.      Dr. Caro Hight will be performing the procedure in my absence  4. He may benefit from a surgical consultation regarding the abnormal      bowel in the presence of a significant parastomal hernia.  5. Would also consider obtaining an endoscopic ultrasound to look at      his pancreas and biliary tree, at a later date.  6. Will address management of his Crohn's disease, once we have sorted      out the etiology of his bleeding, etc as  above.   He has a very poor compliance record and I suspect that his future  management will continue to be a challenge.   ADDENDUM:  Please note that Dr. Stann Mainland will begin seeing Mr. Coulthard on  June 01, 2009, in my absence.      Bridgette Habermann, M.D.  Electronically Signed     RMR/MEDQ  D:  06/01/2009  T:  06/01/2009  Job:  [id number]   cc:   Daniel Howard  Fax: [phone]

## 2011-05-06 NOTE — Discharge Summary (Signed)
NAME:  Daniel Howard, Daniel Howard               ACCOUNT NO.:  [id number]   MEDICAL RECORD NO.:  [id number]          PATIENT TYPE:  INP   LOCATION:  A313                          FACILITY:  [facility]   PHYSICIAN:  Bonnielee Haff, MD     DATE OF BIRTH:  [birth year]   DATE OF ADMISSION:  05/31/2009  DATE OF DISCHARGE:  06/13/2010LH                               DISCHARGE SUMMARY   The patient's PMD is, actually, I do not think he follows up with  anybody.   He has been seen by Dr. Laural Golden gastroenterologist in the past.   Please review H and P dictated by Dr. Marcello Moores.   DISCHARGE DIAGNOSES:  1. Duodenal ulcer causing gastrointestinal bleed, improved.  2. Acute blood loss anemia, improved.  3. History of Crohn disease, status post surgery in the past.   BRIEF HOSPITAL COURSE:  Briefly, this is a 55 year old African American  male with a history of Crohn disease diagnosed many years ago, more than  25 years ago and underwent right hemicolectomy with an ileostomy at that  time.  He has colostomy bag.  He used to follow up with GI many years  ago but then has not been doing so recently.  He does not have a primary  doctor.  He presented to the hospital with complaints of black stool and  pain in his abdomen.  The patient underwent a CAT scan of the abdomen  and pelvis which showed ductal dilatation and showed abdominal wall  hernia containing colon.  No evidence of obstruction was noted and  thickened distal small bowel loops and terminal ileum was seen,  suggesting Crohn disease.  The patient was put on Cipro, Flagyl.  He was  seen by gastroenterology.  He was found to be anemic at 7.4.  He was  transfused 2 units of blood.  He was subsequently endoscoped and GI  found a large duodenal ulcer which was likely the cause for his melena.  The gastric erosion and ulcers were also noted.  Biopsies were taken.  PPI was recommended.  He was asked to avoid aspirin and NSAIDs for the  next 1 month.  Asacol was  initiated for his Crohn disease.  His pain has  resolved.  His hemoglobin is stable.  He is tolerating p.o. intake and  he is keen on going home.   Otherwise, he denies any symptoms.   PHYSICAL EXAMINATION:  VITAL SIGNS:  Stable.  He runs chronically  hypotensive.  ABDOMEN:  Soft, nontender.  Ileostomy bag is stable, green stool, no  blood, bowel sounds are present.  LUNGS:  Clear to auscultation.   Hemoglobin this morning is 11.1.   DISCHARGE MEDICATIONS:  1. Flagyl 500 mg daily for 5 days.  2. Omeprazole 20 mg b.i.d.  3. Asacol 400 mg 2 tablets t.i.d.   He has been asked to discontinue Cipro and Hycodan.   FOLLOWUP:  Follow up with gastroenterology in 1 month.   DIET:  Regular diet.   PHYSICAL ACTIVITY:  As before.   CONSULTATION:  From gastroenterology.   PROCEDURE UNDERGONE:  Esophagogastroduodenoscopy as discussed above.  LABORATORY DATA:  Other pertinent blood tests include ESR which is 15,  normal.  LFTs were unremarkable.  Albumin was low at 2.5, lipase was  normal.  Lactic acid was normal.  UA was unremarkable.   TOTAL TIME ON THIS ENCOUNTER:  35 minutes.      Bonnielee Haff, MD  Electronically Signed     GK/MEDQ  D:  06/03/2009  T:  06/03/2009  Job:  [id number]   cc:   Caro Hight, M.D.  [address]  [place] , [place] [address]   R. Garfield Cornea, M.D.  [address]  [place]  [place] [address]

## 2011-05-09 NOTE — Consult Note (Signed)
NAME:  Daniel Howard, Daniel Howard               ACCOUNT NO.:  [id number]   MEDICAL RECORD NO.:  [id number]         PATIENT TYPE:  AMB   LOCATION:                                FACILITY:  [facility]   PHYSICIAN:  Hildred Laser, M.D.    DATE OF BIRTH:  1956/09/04   DATE OF CONSULTATION:  06/19/2006  DATE OF DISCHARGE:                                   CONSULTATION   REFERRING PHYSICIAN:  Dr. Jonna Munro.   REASON FOR CONSULTATION:  Crohn's disease, anemia.   HISTORY OF PRESENT ILLNESS:  [name] is a 55 year old African-American  gentleman with history of Crohn's disease status post right hemicolectomy  with ileostomy who we have not seen for many years.  He presents now for  reevaluation and iron-deficiency anemia.  He has a history of Crohn's  disease dating back to age 46.  He has never been on maintenance therapy for  Crohn's disease.  See past medical history below for his extensive history.   Recently, he developed abdominal pain and back pain.  He saw Dr. Jonna Munro.  He was noted to have iron-deficiency anemia.  He was also seen recently in  the ED at [facility] this month.  He had a CT of the abdomen  without contrast which revealed mild hepatomegaly with length of 18.3 cm,  osteo my on the right side involving the anterior abdominal wall.  There is  a hernia of a spigelian-type in the left lower quadrant containing bowel  loops but no obstruction.   Recent labs on Apr 27, 2006 revealed an iron of 13, saturations 4%, TIBC 371,  ferritin 11, hemoglobin 10.7, hematocrit 35.8, MCV 80.6.  LFTs were normal.  B12 537.  He has been on Repliva daily.  He was given a Medrol Dosepak for  his abdominal pain and has noted resolution of his symptoms.   He states his stool output has decreased since Medrol Dosepak.  He has had  no further abdominal pain or back pain.  He generally empties his bag three  to four times a day.  Stool is brown and liquidity.  Denies any melena or  rectal bleeding  in the bag.  He does have rectal discharge several times a  week which he describes as mucusy and sometimes pink tinged.  He is not sure  if he has had any weight loss.  He says his appetite is good.   CURRENT MEDICATIONS:  Repliva one daily.   ALLERGIES:  PENICILLIN.   PAST MEDICAL HISTORY:  History of Crohn's disease.  He apparently carried a  diagnosis of ulcerative colitis for 9 to 10 years until 1992 when it was  finally diagnosed histologically.  He had a history of undergoing I&D of a  perirectal abscess in 1988 and 1999 by Dr. Lindalou Hose and Dr. Anthony Sar  respectively.  Dr. Laural Golden saw the patient in consultation in 1991.  In  February 1992, a barium enema revealed pictures suggestive of colitis but  there was no official disease.  A small-bowel follow-through om 1982 was  normal.  In 1988, flexible sigmoidoscopy by Dr. Laural Golden  suggested Crohn's  disease but the biopsies were not collaborative.  An exploratory laparotomy  with incidental appendectomy in 1992 revealed multiple fistulas involving  the rectosigmoid colon with sigmoid stricture.  He underwent a flexible  sigmoidoscopy with biopsies and at that time the colonic biopsies were  consistent with Crohn's disease.  He is status post transverse loop  colostomy secondary to the stricture in 1992.  He subsequently developed a  prolapse which required resection of the transverse colon and revision of  the colostomy to end-transverse ostomy and mucus fistula.  He developed  recurrent prolapse and in September 1993 he had a right hemicolectomy by Dr.  Anthony Sar followed by takedown of the colostomy with an ileostomy.  In 1996,  he had an incarcerated periosteal hernia which required additional surgery.  In 1999, a small-bowel follow-through revealed dilatation of the mid and  distally small bowel with a transition area seen in the region of the  jejunum but no stricture noted in the distal small bowel to account for the  small bowel  dilatation. That is the last point of contact with the patient.  He denies any other significant diseases or other type of surgeries.   FAMILY HISTORY:  Father died of cancer, possibly prostate.   SOCIAL HISTORY:  He is married to his third of seven years.  He has seven  children.  He is on disability.  He smokes two cigars daily.  He consumes  alcohol once every two to three months.  He smokes marijuana and cocaine  occasionally, usually about once a month at this point.   REVIEW OF SYSTEMS:  See HPI for GI.  CONSTITUTIONAL/CARDIOPULMONARY:  No  chest pain or shortness of breath.   PHYSICAL EXAMINATION:  VITAL SIGNS:  Weight 126.  Height 5 foot 4-1/2  inches.  Temperature 98.8, blood pressure 110/68, pulse 68.  GENERAL:  A pleasant, thin black male in no acute distress.  SKIN:  Warm and dry.  No jaundice.  HEENT:  Sclerae nonicteric.  Oropharyngeal mucosa moist and pink.  No  lesions, erythema or exudates.  Dentition in poor repair with a couple of  missing teeth.  No lymphadenopathy or thyromegaly.  CHEST:  Lungs are clear to auscultation.  CARDIOVASCULAR:  Regular rate and rhythm.  No murmurs, gallops, or rubs.  ABDOMEN:  Positive bowel sounds.  He has ileostomy in the right mid-abdomen.  His stump measures about 2-1/2 to 3 inches long.  Healthy mucosa.  He has  brown, liquidity stool in the bag.  On the left abdomen, there is a  indurated superficial lesion with small denuded area.  There is serous fluid  present.  Below that, there is multiple palpable loops of bowel which is  nontender.  Abdomen is nontender throughout.  No abdominal bruits.  EXTREMITIES:  No edema.  RECTAL:  No evidence of fistulas, although there is some scarring  perirectally possibly from prior surgery.  Rectum is somewhat stenotic.  Exam limited.  Excretions are heme negative.   IMPRESSION:  Cyprian is a 55 year old gentleman with a longstanding history of what appears to be Crohn's colitis.  He is status  post multiple surgeries  and to what I can gather has an ileostomy with very little colon on the  left.  He has ongoing rectal discharge of pink tinged mucus a couple of  times a week.  He has not seen any blood in his ostomy bag.  Recently, it  sounds like he had a flare up  of his Crohn's but it responded to Medrol  Dosepak.  He has never been on maintenance therapy and we will need to  pursue this in the near future.  In the interim, we need to further evaluate  his iron-deficiency anemia.  Notably on recent CT, he had a spigelian hernia  in the left abdomen which he is asymptomatic.   PLAN:  1.  EGD with flexible sigmoidoscopy and ileostomy in the near future.  2.  He will hold Repliva for seven days prior to procedure.  3.  We will discuss maintenance therapy for Crohn's disease at a later date.      Neil Crouch, P.A.      Hildred Laser, M.D.  Electronically Signed    LL/MEDQ  D:  06/19/2006  T:  06/20/2006  Job:  [id number]

## 2011-05-22 ENCOUNTER — Telehealth: Payer: Self-pay | Admitting: Urgent Care

## 2011-05-22 NOTE — Telephone Encounter (Signed)
Please call pt to see if he has had promethius thiopurine metabolites drawn? Thanks

## 2011-05-22 NOTE — Telephone Encounter (Signed)
Called [facility] they do not have any record of pt having come in to have this drawn. I will send reminder letter to pt.

## 2011-05-23 ENCOUNTER — Encounter: Payer: Self-pay | Admitting: Internal Medicine

## 2011-05-23 NOTE — Telephone Encounter (Signed)
Thanks

## 2011-06-02 ENCOUNTER — Telehealth: Payer: Self-pay | Admitting: Urgent Care

## 2011-06-02 NOTE — Telephone Encounter (Signed)
Pt and pts wife aware, new rx called into [facility]

## 2011-06-02 NOTE — Telephone Encounter (Signed)
Prometheus metabolites reviewed Please have pt increase Imuran to 163m daily  Imuran 530mTake 1 in AM & 1-1/2 in PM #75, 1 RF Thanks

## 2011-06-05 ENCOUNTER — Encounter: Payer: Self-pay | Admitting: Urgent Care

## 2011-06-16 ENCOUNTER — Other Ambulatory Visit: Payer: Self-pay | Admitting: Gastroenterology

## 2011-06-16 ENCOUNTER — Other Ambulatory Visit: Payer: Self-pay

## 2011-06-16 NOTE — Telephone Encounter (Signed)
Already addressed

## 2011-06-17 ENCOUNTER — Other Ambulatory Visit: Payer: Self-pay | Admitting: Gastroenterology

## 2011-06-17 NOTE — Telephone Encounter (Signed)
Pt needs to give [place] PR. Can't refill at this time. Needs update or OV.

## 2011-06-18 ENCOUNTER — Other Ambulatory Visit: Payer: Self-pay | Admitting: Gastroenterology

## 2011-06-19 ENCOUNTER — Encounter: Payer: Self-pay | Admitting: Gastroenterology

## 2011-06-19 ENCOUNTER — Telehealth: Payer: Self-pay

## 2011-06-19 MED ORDER — HYDROCODONE-ACETAMINOPHEN 5-325 MG PO TABS: 1.0000 | ORAL_TABLET | Freq: Four times a day (QID) | ORAL | Status: DC | PRN

## 2011-06-19 NOTE — Progress Notes (Unsigned)
  Received multiple requests from pharmacy for norco. Last prescribed in April, I believe. Have sent back to pharmacy to have pt call our office if having issues. Please see how pt is doing. Inform pharmacy that we are not filling right at this moment unless we have more information.

## 2011-06-19 NOTE — Telephone Encounter (Signed)
While getting meds adjusted for known disease, may have one-time refill of vicodin 5/500, take 1 po every 6 hours prn pain. Disp# 20 with no refills to pharmacy.

## 2011-06-19 NOTE — Telephone Encounter (Signed)
rx faxed to pharmacy

## 2011-06-19 NOTE — Progress Notes (Signed)
Pt called- please see phone note in rx refill box

## 2011-06-19 NOTE — Telephone Encounter (Signed)
Pt called- requesting refill on his pain meds. He stated he only takes them about once or twice a week or when he has a bad day. He stated it was the only thing that helps when he does have pain. He has a return ov scheduled for August. Pt uses [facility].

## 2011-06-19 NOTE — Telephone Encounter (Signed)
Correction:  Norco, not Vicodin. #20.

## 2011-07-18 ENCOUNTER — Other Ambulatory Visit: Payer: Self-pay | Admitting: Urgent Care

## 2011-07-19 LAB — CBC WITH DIFFERENTIAL/PLATELET
Basophils Absolute: 0 10*3/uL (ref 0.0–0.1)
Basophils Relative: 1 % (ref 0–1)
HCT: 36.7 % — ABNORMAL LOW (ref 39.0–52.0)
Lymphocytes Relative: 21 % (ref 12–46)
MCHC: 31.6 g/dL (ref 30.0–36.0)
Monocytes Absolute: 0.4 10*3/uL (ref 0.1–1.0)
Neutro Abs: 4.3 10*3/uL (ref 1.7–7.7)
Neutrophils Relative %: 69 % (ref 43–77)
Platelets: 301 10*3/uL (ref 150–400)
RDW: 14.2 % (ref 11.5–15.5)
WBC: 6.2 10*3/uL (ref 4.0–10.5)

## 2011-07-25 ENCOUNTER — Other Ambulatory Visit: Payer: Self-pay

## 2011-07-25 MED ORDER — HYDROCODONE-ACETAMINOPHEN 5-325 MG PO TABS: 1.0000 | ORAL_TABLET | Freq: Four times a day (QID) | ORAL | Status: DC | PRN

## 2011-07-25 NOTE — Telephone Encounter (Signed)
Pt called he has appt next week and he has run out of his pain med. He only takes 1-2 a week, when he has a bad day. Last refill was 06/19/11 for #20.

## 2011-07-31 ENCOUNTER — Encounter: Payer: Self-pay | Admitting: Internal Medicine

## 2011-08-01 ENCOUNTER — Other Ambulatory Visit: Payer: Self-pay | Admitting: Internal Medicine

## 2011-08-01 ENCOUNTER — Ambulatory Visit (INDEPENDENT_AMBULATORY_CARE_PROVIDER_SITE_OTHER): Payer: Medicare Other | Admitting: Internal Medicine

## 2011-08-01 ENCOUNTER — Encounter: Payer: Self-pay | Admitting: Internal Medicine

## 2011-08-01 VITALS — BP 100/66 | HR 61 | Temp 97.2°F | Ht 65.0 in | Wt 138.0 lb

## 2011-08-01 DIAGNOSIS — K5090 Crohn's disease, unspecified, without complications: Secondary | ICD-10-CM

## 2011-08-01 LAB — HEPATIC FUNCTION PANEL
ALT: 8 U/L (ref 0–53)
Bilirubin, Direct: 0.2 mg/dL (ref 0.0–0.3)
Indirect Bilirubin: 0.5 mg/dL (ref 0.0–0.9)
Total Protein: 7 g/dL (ref 6.0–8.3)

## 2011-08-01 NOTE — Assessment & Plan Note (Deleted)
  I've urged him to develop a relationship with a primary care physician.

## 2011-08-03 NOTE — Progress Notes (Signed)
Referring Provider: Tula Nakayama, MD Primary Care Physician:  Tula Nakayama, MD, MD Primary Gastroenterologist:  Dr.   Chief Complaint  Patient presents with  . Follow-up    doing ok    HPI:  Daniel Howard is a 55 y.o. male here for follow up for complicated ileocolonic Crohn's disease. Dose of azathioprine recently increased. He has 4 movements through his ostomy daily . Occasional abdominal pain when he coughs. We refilled his hydrocodone once since his last office visit. No bleeding. No vomiting.  Await up a half pound since his last visit. LFTs look good. MRCP demonstrated chronically dilated pancreatic and bile duct without other abnormality. Mild anemia with elevated MCV. No B12 supplementation. No nausea or vomiting. Bone density study done January 2011.  He requests a handicap parking sticker and a refill on his ostomy supplies  Past Medical History  Diagnosis Date  . Anxiety   . Low back pain   . 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 Abnormal finding of biliary tract     MRCP shows pancreatic/biliary tract dilation  . Spigelian hernia     Past Surgical History  Procedure Date  . Colostomy   . Colectomy   . Hernia repair     incarcerated periostial hernia    Current Outpatient Prescriptions  Medication Sig Dispense Refill  . azaTHIOprine (IMURAN) 50 MG tablet 125 mg. 1 in the am, 11/2 in the pm        . Calcium Carbonate-Vit D-Min (CALCIUM 1200 PO) Take 1,200 mg by mouth 1 dose over 46 hours.        [name] HYDROcodone-acetaminophen (NORCO) 5-325 MG per tablet Take 1 tablet by mouth every 6 (six) hours as needed.  20 tablet  0  . Multiple Vitamin (MULTIVITAMIN) tablet Take 1 tablet by mouth daily.        [name] DISCONTD: azaTHIOprine (IMURAN) 50 MG tablet 2 BY MOUTH DAILY   60 tablet  5  . FeFum-FePoly-FA-B  Cmp-C-Biot (INTEGRA PLUS) CAPS Take 1 capsule by mouth daily.  30 capsule  5    Allergies as of 08/01/2011 - Review Complete 08/01/2011  Allergen Reaction Noted  . Penicillins  01/25/2007    No family history on file.  History   Social History  . Marital Status: Married    Spouse Name: [name]    Number of Children: 50  . Years of Education: [name]   Occupational History  . disabled    Social History Main Topics  . Smoking status: Former Smoker -- 0.5 packs/day for 2 years    Types: Cigarettes    Quit date: 12/21/2009  . Smokeless tobacco: Not on file  . Alcohol Use: No  . Drug Use: No  . Sexually Active: Yes -- Male partner(s)   Other Topics Concern  . Not on file   Social History Narrative  . No narrative on file    Review of Systems: Gen: Denies any fever, chills, sweats, anorexia, fatigue, weakness, malaise, weight loss, and sleep disorder CV: Denies chest pain, angina, palpitations, syncope, orthopnea, PND, peripheral edema, and claudication. Resp: Denies dyspnea at rest, dyspnea with exercise, cough, sputum, wheezing, coughing up blood, and pleurisy. GI: Denies vomiting blood, jaundice   Denies dysphagia or odynophagia. Derm: Denies rash, itching, dry skin, hives, moles, warts, or unhealing ulcers.  Psych: Denies depression, anxiety, memory loss,  suicidal ideation, hallucinations, paranoia, and confusion. Heme: Denies bruising, bleeding, and enlarged lymph nodes.  Physical Exam: BP 100/66  Pulse 61  Temp(Src) 97.2 F (36.2 C) (Temporal)  Ht 5' 5"  (1.651 m)  Wt 62.596 kg (138 lb)  BMI 22.96 kg/m2 General:   Alert,  Well-developed, well-nourished, pleasant and cooperative in NAD Head:  Normocephalic and atraumatic. Eyes:  Sclera clear, no icterus.   Conjunctiva pink. Mouth:  No deformity or lesions, dentition normal. Neck:  Supple; no masses or thyromegaly. Heart:  Regular rate and rhythm; no murmurs, clicks, rubs,  or gallops. Abdomen:    nondistended .  Multiple surgical scars with advanced keloid formation. Ostomy right lower quadrant looks good.. No masses, hepatosplenomegaly or hernias noted. Normal bowel sounds, without guarding, and without rebound.   Msk:  Symmetrical without gross deformities. Normal posture. Pulses:  Normal pulses noted. Extremities:  Without clubbing or edema. Neurologic:  Alert and  oriented x4;  grossly normal neurologically. Skin:  Intact without significant lesions or rashes. Cervical Nodes:  No significant cervical adenopathy. Psych:  Alert and cooperative. Normal mood and affect.

## 2011-08-03 NOTE — Patient Instructions (Signed)
Check a B12 level and LFTs today. We'll recheck a CBC in 3 months. He may need B12 supplementation. If filled out a form for handicap parking sticker. We'll also arrange to have his ostomy supplies procured through [employer].     Followup appointment here in 4 months.  I've urged him to develop a relationship with a primary care physician.

## 2011-08-05 LAB — VITAMIN B12: Vitamin B-12: 551 pg/mL (ref 211–911)

## 2011-08-21 ENCOUNTER — Telehealth: Payer: Self-pay

## 2011-08-21 ENCOUNTER — Other Ambulatory Visit: Payer: Self-pay | Admitting: Gastroenterology

## 2011-08-21 MED ORDER — HYDROCODONE-ACETAMINOPHEN 5-325 MG PO TABS: 1.0000 | ORAL_TABLET | Freq: Four times a day (QID) | ORAL | Status: DC | PRN

## 2011-08-21 NOTE — Telephone Encounter (Signed)
1 last refill until pt can establish PCP.

## 2011-08-21 NOTE — Telephone Encounter (Signed)
Pt called requesting refill of norco, spoke with AS, she gave 1 more refill and pt needs to find pcp. Advised pt and faxed rx to [facility].

## 2011-08-28 ENCOUNTER — Other Ambulatory Visit: Payer: Self-pay

## 2011-08-29 MED ORDER — AZATHIOPRINE 50 MG PO TABS: ORAL_TABLET | ORAL | Status: DC

## 2011-09-01 ENCOUNTER — Other Ambulatory Visit: Payer: Self-pay | Admitting: Internal Medicine

## 2011-09-01 DIAGNOSIS — K5090 Crohn's disease, unspecified, without complications: Secondary | ICD-10-CM

## 2011-09-22 LAB — URINALYSIS, ROUTINE W REFLEX MICROSCOPIC
Ketones, ur: NEGATIVE
Nitrite: NEGATIVE
Protein, ur: NEGATIVE
pH: 5.5

## 2011-11-07 ENCOUNTER — Encounter: Payer: Self-pay | Admitting: Internal Medicine

## 2011-11-17 ENCOUNTER — Encounter (HOSPITAL_COMMUNITY): Payer: Self-pay | Admitting: *Deleted

## 2011-11-17 ENCOUNTER — Emergency Department (HOSPITAL_COMMUNITY)
Admission: EM | Admit: 2011-11-17 | Discharge: 2011-11-17 | Disposition: A | Discharge status: Home / Self Care | Payer: Medicare Other | Attending: Emergency Medicine | Admitting: Emergency Medicine

## 2011-11-17 DIAGNOSIS — Z933 Colostomy status: Secondary | ICD-10-CM | POA: Insufficient documentation

## 2011-11-17 DIAGNOSIS — R109 Unspecified abdominal pain: Secondary | ICD-10-CM | POA: Insufficient documentation

## 2011-11-17 LAB — COMPREHENSIVE METABOLIC PANEL
ALT: 10 U/L (ref 0–53)
AST: 18 U/L (ref 0–37)
Albumin: 3.2 g/dL — ABNORMAL LOW (ref 3.5–5.2)
Calcium: 9.5 mg/dL (ref 8.4–10.5)
Creatinine, Ser: 1.32 mg/dL (ref 0.50–1.35)
Sodium: 141 mEq/L (ref 135–145)

## 2011-11-17 LAB — URINALYSIS, ROUTINE W REFLEX MICROSCOPIC
Bilirubin Urine: NEGATIVE
Hgb urine dipstick: NEGATIVE
Ketones, ur: NEGATIVE mg/dL
Nitrite: NEGATIVE
Protein, ur: NEGATIVE mg/dL
Specific Gravity, Urine: 1.03 (ref 1.005–1.030)
Urobilinogen, UA: 0.2 mg/dL (ref 0.0–1.0)

## 2011-11-17 MED ORDER — HYDROMORPHONE HCL PF 1 MG/ML IJ SOLN
0.5000 mg | Freq: Once | INTRAMUSCULAR | Status: AC
  Administered 2011-11-17: 0.5 mg via INTRAVENOUS
  Filled 2011-11-17: qty 1

## 2011-11-17 MED ORDER — SODIUM CHLORIDE 0.9 % IV SOLN
999.0000 mL | Freq: Once | INTRAVENOUS | Status: AC
  Administered 2011-11-17: 999 mL via INTRAVENOUS

## 2011-11-17 MED ORDER — ONDANSETRON HCL 4 MG/2ML IJ SOLN
4.0000 mg | Freq: Once | INTRAMUSCULAR | Status: AC
  Administered 2011-11-17: 4 mg via INTRAVENOUS
  Filled 2011-11-17: qty 2

## 2011-11-17 MED ORDER — GI COCKTAIL ~~LOC~~
30.0000 mL | Freq: Once | ORAL | Status: AC
  Administered 2011-11-17: 30 mL via ORAL
  Filled 2011-11-17: qty 30

## 2011-11-17 NOTE — Progress Notes (Signed)
Pt came by office, requesting refill on entocort. There was no documentation in medication list that pt was taking entocort. Spoke with pharmacist at [facility]- he stated it was started by KJ on 05/12/11. Spoke with KJ- pt needs OV to discuss medications. Manuela Schwartz made pt appt for 12/08/11.  Pt agreeable.

## 2011-11-17 NOTE — Progress Notes (Signed)
Thanks

## 2011-11-17 NOTE — ED Notes (Signed)
Pt c/o right sided abdominal pain x 2 weeks. Pt denies nausea, vomiting or diarrhea. Pt c/o small amount burning with urination.

## 2011-11-17 NOTE — ED Provider Notes (Signed)
History   This chart was scribed for Carmin Muskrat, MD by Carolyne Littles. The patient was seen in room APA11 and the patient's care was started at 9:00:AM.   CSN: [id number] Arrival date & time: 11/17/2011  8:50 AM   First MD Initiated Contact with Patient 11/17/11 [phone]      Chief Complaint  Patient presents with  . Abdominal Pain    (Consider location/radiation/quality/duration/timing/severity/associated sxs/prior treatment) The history is provided by the patient.   Daniel Howard is a 55 y.o. male who presents to the Emergency Department complaining of intermittent, right side abdominal pain near stoma of colostomy onset 7 days ago. Pt reports that the pain subsides on its own without modifying factors. He denies vomiting, fever, chills and diarrhea. Pt states there is minimal dysuria. Pt has a hx of Crohn's disease. Pt denies having HTN and diabetes. Colostomy was done 63yr ago.    GI is Dr. RGala RomneyPCP- Dr. HNevada Crane   Past Medical History  Diagnosis Date  . Anxiety   . Low back pain   . 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 Abnormal finding of biliary tract     MRCP shows pancreatic/biliary tract dilation  . Spigelian hernia     Past Surgical History  Procedure Date  . Colostomy   . Colectomy   . Hernia repair     incarcerated periostial hernia    History reviewed. No pertinent family history.  History  Substance Use Topics  . Smoking status: Former Smoker -- 0.5 packs/day for 2 years    Types: Cigarettes    Quit date: 12/21/2009  . Smokeless tobacco: Not on file  . Alcohol Use: No      Review of Systems 10 Systems reviewed and are negative for acute change except as noted in the HPI.  Allergies  Penicillins  Home Medications   Current Outpatient Rx  Name Route Sig Dispense Refill  .  AZATHIOPRINE 50 MG PO TABS Oral Take 50-75 mg by mouth 2 (two) times daily. Takes 50 mg in the morning and 75 mg in the evening     . CALCIUM 1200 PO Oral Take 1,200 mg by mouth daily.     .Valinda HoarPLUS PO CAPS Oral Take 1 capsule by mouth daily. 30 capsule 5  . THERA M PLUS PO TABS Oral Take 1 tablet by mouth daily.      .Marland KitchenHYDROCODONE-ACETAMINOPHEN 5-325 MG PO TABS Oral Take 1 tablet by mouth every 6 (six) hours as needed. For pain       Triage vitals: BP 113/76  Pulse 84  Temp(Src) 97.5 F (36.4 C) (Oral)  Resp 16  Ht 5' 4"  (1.626 m)  Wt 135 lb (61.236 kg)  BMI 23.17 kg/m2  SpO2 99%  Physical Exam  Nursing note and vitals reviewed. Constitutional: He is oriented to person, place, and time. He appears well-developed and well-nourished. No distress.  HENT:  Head: Normocephalic and atraumatic.  Eyes: EOM are normal. Pupils are equal, round, and reactive to light.  Neck: Normal range of motion. Neck supple. No tracheal deviation present.  Cardiovascular: Normal rate, regular rhythm and normal heart sounds.   Pulmonary/Chest: Effort normal. No respiratory distress.  Abdominal: Soft. Bowel sounds are normal. He exhibits no distension. There is no tenderness. There is no rebound and no guarding.  Colostomy with thin, brown stool present   Musculoskeletal: Normal range of motion. He exhibits no edema.  Neurological: He is alert and oriented to person, place, and time. No sensory deficit.  Skin: Skin is warm and dry.  Psychiatric: He has a normal mood and affect. His behavior is normal.    ED Course  Procedures (including critical care time)  DIAGNOSTIC STUDIES: Oxygen Saturation is 99% on room, normal by my interpretation.    COORDINATION OF CARE:     Labs Reviewed  URINALYSIS, ROUTINE W REFLEX MICROSCOPIC  COMPREHENSIVE METABOLIC PANEL  LIPASE, BLOOD   No results found.   No diagnosis found.    MDM  I personally performed the services described in this  documentation, which was scribed in my presence. The recorded information has been reviewed and considered.  This 55 year old male with a long history of colostomy secondary to Crohn's disease and colectomy now presents with intermittent right sided pain. On exam the patient is in no distress, his stoma is healthy looking, and he has no abdominal pain. The patient's vital signs are within normal limits, and his labs do not demonstrate acute ongoing pathology. Given this absence of acute findings, the patient is appropriate for continued evaluation of his abdominal pain as an outpatient with his gastroenterologist; he will be discharged.      Carmin Muskrat, MD 11/17/11 1007

## 2011-12-08 ENCOUNTER — Ambulatory Visit (INDEPENDENT_AMBULATORY_CARE_PROVIDER_SITE_OTHER): Payer: Medicare Other | Admitting: Urgent Care

## 2011-12-08 ENCOUNTER — Encounter: Payer: Self-pay | Admitting: Urgent Care

## 2011-12-08 VITALS — BP 97/66 | HR 89 | Temp 97.5°F | Ht 64.0 in | Wt 145.0 lb

## 2011-12-08 DIAGNOSIS — K5090 Crohn's disease, unspecified, without complications: Secondary | ICD-10-CM

## 2011-12-08 NOTE — Patient Instructions (Signed)
You need to find a primary care provider Resume Integra daily To lab for vitamin B 12, CBC, LFTS in March 2013 Office visit in 6 months or sooner if needed

## 2011-12-08 NOTE — Progress Notes (Signed)
Primary Care Physician:  [name] Primary Gastroenterologist:  Dr. Gala Romney  Chief Complaint  Patient presents with  . Follow-up    pain on the left side/some black stool sometimes    HPI:  Daniel Howard is a 55 y.o. male here for follow up for Crohn's disease. He is doing very well. He is on iron and occasionally notices dark stool in his back. He denies any profuse diarrhea denies any chronic abdominal pain denies any nausea or vomiting. His appetite is good. Denies any fever/ chills.  Recent Results (from the past 1008 hour(s))  URINALYSIS, ROUTINE W REFLEX MICROSCOPIC   Collection Time   11/17/11  9:16 AM      Component Value Range   Color, Urine YELLOW  YELLOW    APPearance CLEAR  CLEAR    Specific Gravity, Urine 1.030  1.005 - 1.030    pH 5.5  5.0 - 8.0    Glucose, UA NEGATIVE  NEGATIVE (mg/dL)   Hgb urine dipstick NEGATIVE  NEGATIVE    Bilirubin Urine NEGATIVE  NEGATIVE    Ketones, ur NEGATIVE  NEGATIVE (mg/dL)   Protein, ur NEGATIVE  NEGATIVE (mg/dL)   Urobilinogen, UA 0.2  0.0 - 1.0 (mg/dL)   Nitrite NEGATIVE  NEGATIVE    Leukocytes, UA NEGATIVE  NEGATIVE   COMPREHENSIVE METABOLIC PANEL   Collection Time   11/17/11  9:27 AM      Component Value Range   Sodium 141  135 - 145 (mEq/L)   Potassium 3.9  3.5 - 5.1 (mEq/L)   Chloride 103  96 - 112 (mEq/L)   CO2 32  19 - 32 (mEq/L)   Glucose, Bld 67 (*) 70 - 99 (mg/dL)   BUN 14  6 - 23 (mg/dL)   Creatinine, Ser 1.32  0.50 - 1.35 (mg/dL)   Calcium 9.5  8.4 - 10.5 (mg/dL)   Total Protein 7.6  6.0 - 8.3 (g/dL)   Albumin 3.2 (*) 3.5 - 5.2 (g/dL)   AST 18  0 - 37 (U/L)   ALT 10  0 - 53 (U/L)   Alkaline Phosphatase 54  39 - 117 (U/L)   Total Bilirubin 0.5  0.3 - 1.2 (mg/dL)   GFR calc non Af Amer 60 (*) >90 (mL/min)   GFR calc Af Amer 70 (*) >90 (mL/min)  LIPASE, BLOOD   Collection Time   11/17/11  9:27 AM      Component Value Range   Lipase 34  11 - 59 (U/L)     Past Medical History  Diagnosis Date  . Anxiety   . Low  back pain   . 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 Abnormal finding of biliary tract     MRCP shows pancreatic/biliary tract dilation  . Spigelian hernia     Past Surgical History  Procedure Date  . Colostomy   . Colectomy   . Hernia repair     incarcerated periostial hernia    Current Outpatient Prescriptions  Medication Sig Dispense Refill  . azaTHIOprine (IMURAN) 50 MG tablet Take 50-75 mg by mouth 2 (two) times daily. Takes 50 mg in the morning and 75 mg in the evening       . Calcium Carbonate-Vit D-Min (CALCIUM 1200 PO) Take 1,200 mg by mouth daily.       . ferrous gluconate (FERGON) 325 MG tablet  Take 325 mg by mouth daily with breakfast.        . Multiple Vitamins-Minerals (MULTIVITAMINS THER. W/MINERALS) TABS Take 1 tablet by mouth daily.          Allergies as of 12/08/2011 - Review Complete 12/08/2011  Allergen Reaction Noted  . Penicillins Hives 01/25/2007    Review of Systems: Gen: Denies any fever, chills, sweats, anorexia, fatigue, weakness, malaise, weight loss, and sleep disorder CV: Denies chest pain, angina, palpitations, syncope, orthopnea, PND, peripheral edema, and claudication. Resp: Denies dyspnea at rest, dyspnea with exercise, cough, sputum, wheezing, coughing up blood, and pleurisy. GI: Denies vomiting blood, jaundice, and fecal incontinence.   Denies dysphagia or odynophagia. Derm: Denies rash, itching, dry skin, hives, moles, warts, or unhealing ulcers.  Psych: Denies depression, anxiety, memory loss, suicidal ideation, hallucinations, paranoia, and confusion. Heme: Denies bruising, bleeding, and enlarged lymph nodes.  Physical Exam: BP 97/66  Pulse 89  Temp(Src) 97.5 F (36.4 C) (Temporal)  Ht 5' 4"  (1.626 m)  Wt 145 lb (65.772 kg)  BMI 24.89 kg/m2 General:   Alert,   Well-developed, thin, pleasant and cooperative in NAD. By his wife and son. Eyes:  Sclera clear, no icterus.   Conjunctiva pink. Mouth:  No deformity or lesions, oropharynx pink and moist. Neck:  Supple; no masses or thyromegaly. Heart:  Regular rate and rhythm; no murmurs, clicks, rubs,  or gallops. Abdomen:  Positive for beefy-red stoma right lower quadrant. Greenish brown stool in bag. Soft, non-tender and non-distended without masses, hepatosplenomegaly or hernias noted.  No guarding or rebound tenderness.   Msk:  Symmetrical withounormt gross deformities.  Pulses:  Normal pulses noted. Extremities:  No clubbing or edema. Neurologic:  Alert and oriented x4;  grossly normal neurologically. Skin:  Intact without significant lesions or rashes.

## 2011-12-08 NOTE — Progress Notes (Signed)
No PCP on file

## 2011-12-08 NOTE — Assessment & Plan Note (Addendum)
Stable. Doing well. His weight is back up again.  You need to find a primary care provider Resume Integra daily To lab for vitamin B 12, CBC, LFTS in March 2013 Office visit in 6 months or sooner if needed

## 2011-12-12 ENCOUNTER — Encounter (HOSPITAL_COMMUNITY): Payer: Self-pay | Admitting: *Deleted

## 2011-12-12 ENCOUNTER — Emergency Department (HOSPITAL_COMMUNITY)
Admission: EM | Admit: 2011-12-12 | Discharge: 2011-12-13 | Disposition: A | Discharge status: Home / Self Care | Payer: Medicare Other | Attending: Emergency Medicine | Admitting: Emergency Medicine

## 2011-12-12 ENCOUNTER — Emergency Department (HOSPITAL_COMMUNITY): Payer: Medicare Other

## 2011-12-12 DIAGNOSIS — Z87891 Personal history of nicotine dependence: Secondary | ICD-10-CM | POA: Insufficient documentation

## 2011-12-12 DIAGNOSIS — R1032 Left lower quadrant pain: Secondary | ICD-10-CM | POA: Insufficient documentation

## 2011-12-12 DIAGNOSIS — K5090 Crohn's disease, unspecified, without complications: Secondary | ICD-10-CM | POA: Insufficient documentation

## 2011-12-12 DIAGNOSIS — K439 Ventral hernia without obstruction or gangrene: Secondary | ICD-10-CM | POA: Insufficient documentation

## 2011-12-12 LAB — COMPREHENSIVE METABOLIC PANEL
ALT: 9 U/L (ref 0–53)
AST: 19 U/L (ref 0–37)
Calcium: 9.4 mg/dL (ref 8.4–10.5)
Creatinine, Ser: 1.29 mg/dL (ref 0.50–1.35)
GFR calc Af Amer: 72 mL/min — ABNORMAL LOW (ref >90)
GFR calc non Af Amer: 62 mL/min — ABNORMAL LOW (ref >90)
Glucose, Bld: 86 mg/dL (ref 70–99)
Sodium: 137 mEq/L (ref 135–145)
Total Protein: 7.2 g/dL (ref 6.0–8.3)

## 2011-12-12 LAB — DIFFERENTIAL
Basophils Absolute: 0.1 10*3/uL (ref 0.0–0.1)
Eosinophils Absolute: 0.3 10*3/uL (ref 0.0–0.7)
Eosinophils Relative: 3 % (ref 0–5)
Monocytes Absolute: 0.8 10*3/uL (ref 0.1–1.0)

## 2011-12-12 LAB — CBC
HCT: 36.8 % — ABNORMAL LOW (ref 39.0–52.0)
MCH: 33 pg (ref 26.0–34.0)
MCV: 94.8 fL (ref 78.0–100.0)
Platelets: 409 10*3/uL — ABNORMAL HIGH (ref 150–400)
RDW: 13.1 % (ref 11.5–15.5)

## 2011-12-12 MED ORDER — ONDANSETRON HCL 4 MG/2ML IJ SOLN
4.0000 mg | Freq: Once | INTRAMUSCULAR | Status: AC
  Administered 2011-12-12: 4 mg via INTRAVENOUS
  Filled 2011-12-12: qty 2

## 2011-12-12 MED ORDER — SODIUM CHLORIDE 0.9 % IV SOLN
Freq: Once | INTRAVENOUS | Status: AC
  Administered 2011-12-12: 22:00:00 via INTRAVENOUS

## 2011-12-12 MED ORDER — HYDROMORPHONE HCL PF 1 MG/ML IJ SOLN
1.0000 mg | Freq: Once | INTRAMUSCULAR | Status: AC
  Administered 2011-12-12: 1 mg via INTRAVENOUS
  Filled 2011-12-12: qty 1

## 2011-12-12 MED ORDER — IOHEXOL 300 MG/ML  SOLN
100.0000 mL | Freq: Once | INTRAMUSCULAR | Status: AC | PRN
  Administered 2011-12-12: 100 mL via INTRAVENOUS

## 2011-12-12 NOTE — ED Provider Notes (Signed)
History   Scribed for Daniel Speak, MD, the patient was seen in room APA11/APA11 . This chart was scribed by [name].    CSN: [id number]  Arrival date & time 12/12/11  2054   First MD Initiated Contact with Patient 12/12/11 2126      Chief Complaint  Patient presents with  . Abdominal Pain    (Consider location/radiation/quality/duration/timing/severity/associated sxs/prior treatment) HPI Daniel Howard is a 55 y.o. male who presents to the Emergency Department complaining of soreness and abdominal pain described as sharp and being located in the lower abdomen onset a few days ago. Pt h/o of colostomy due to Crohn's disease 30 years ago in [place]. Associated symptoms include diarrhea. Pt denies fever and vomiting. Pt has had 12 abdominal surgeries in the past. Pt is unsure if this is a Crohn's flare up.   Dr. Rutherford Limerick is pt Gastroenterologist last seen 1 week ago.    Past Medical History  Diagnosis Date  . Anxiety   . Low back pain   . 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 Abnormal finding of biliary tract     MRCP shows pancreatic/biliary tract dilation  . Spigelian hernia     Past Surgical History  Procedure Date  . Colostomy   . Colectomy   . Hernia repair     incarcerated periostial hernia    No family history on file.  History  Substance Use Topics  . Smoking status: Former Smoker -- 0.5 packs/day for 2 years    Types: Cigarettes    Quit date: 12/21/2009  . Smokeless tobacco: Not on file  . Alcohol Use: No      Review of Systems 10 Systems reviewed and are negative for acute change except as noted in the HPI.   Allergies  Penicillins  Home Medications   Current Outpatient Rx  Name Route Sig Dispense Refill  . AZATHIOPRINE 50 MG PO TABS Oral Take 50-75 mg by mouth 2 (two) times daily.  Takes 50 mg in the morning and 75 mg in the evening     . CALCIUM 1200 PO Oral Take 1,200 mg by mouth daily.     [name] FERROUS GLUCONATE 325 MG PO TABS Oral Take 325 mg by mouth daily with breakfast.      . THERA M PLUS PO TABS Oral Take 1 tablet by mouth daily.        BP 111/67  Pulse 88  Temp(Src) 98.3 F (36.8 C) (Oral)  Resp 16  Ht 5' 4"  (1.626 m)  Wt 140 lb (63.504 kg)  BMI 24.03 kg/m2  SpO2 98%  Physical Exam  Nursing note and vitals reviewed. Constitutional: He is oriented to person, place, and time. He appears well-developed and well-nourished. No distress.  HENT:  Right Ear: External ear normal.  Left Ear: External ear normal.  Mouth/Throat: Oropharynx is clear and moist.  Eyes: Conjunctivae are normal. Pupils are equal, round, and reactive to light.  Cardiovascular: Normal rate, regular rhythm and normal heart sounds.   Pulmonary/Chest: Effort normal and breath sounds normal. No respiratory distress.       Clear lungs   Abdominal: Soft. There is no tenderness.       No CVA tenderness  Neurological: He is alert and oriented to person, place, and time.  Skin: Skin is warm and dry.    ED  Course  Procedures (including critical care time) DIAGNOSTIC STUDIES: Oxygen Saturation is 98% on RA, nml by my interpretation.    COORDINATION OF CARE:     Labs Reviewed  CBC - Abnormal; Notable for the following:    RBC 3.88 (*)    Hemoglobin 12.8 (*)    HCT 36.8 (*)    Platelets 409 (*)    All other components within normal limits  DIFFERENTIAL  COMPREHENSIVE METABOLIC PANEL   No results found.   No diagnosis found.    MDM  Patient with history of crohns',  Having abd pain and ct that shows chronic inflammation of the distal ileum.  Will treat for crohn's flare with prednisone, possible infection with flagyl, and give percocet for pain.  To return if worsens.      I personally performed the services described in this documentation, which was scribed in my  presence. The recorded information has been reviewed and considered.     Daniel Speak, MD 12/13/11 0001

## 2011-12-12 NOTE — ED Notes (Signed)
Pt reports pain at stoma site worsening over the past couple of days

## 2011-12-12 NOTE — ED Notes (Signed)
Patient reports abdominal pain at stoma site, radiates to the upper left quadrant. States has been hurting him for 2 days, worsening.

## 2011-12-13 MED ORDER — METRONIDAZOLE 500 MG PO TABS: 500.0000 mg | ORAL_TABLET | Freq: Three times a day (TID) | ORAL | Status: AC

## 2011-12-13 MED ORDER — PREDNISONE 10 MG PO TABS: 20.0000 mg | ORAL_TABLET | Freq: Two times a day (BID) | ORAL | Status: DC

## 2011-12-13 MED ORDER — OXYCODONE-ACETAMINOPHEN 5-325 MG PO TABS: 1.0000 | ORAL_TABLET | Freq: Four times a day (QID) | ORAL | Status: AC | PRN

## 2011-12-30 NOTE — Progress Notes (Signed)
REVIEWED.  

## 2012-01-13 ENCOUNTER — Ambulatory Visit: Payer: Medicare Other | Admitting: Family Medicine

## 2012-02-03 ENCOUNTER — Encounter: Payer: Self-pay | Admitting: Family Medicine

## 2012-02-03 ENCOUNTER — Ambulatory Visit (INDEPENDENT_AMBULATORY_CARE_PROVIDER_SITE_OTHER): Payer: Medicare Other | Admitting: Family Medicine

## 2012-02-03 VITALS — BP 96/72 | HR 80 | Resp 18 | Ht 66.0 in | Wt 148.1 lb

## 2012-02-03 DIAGNOSIS — K5090 Crohn's disease, unspecified, without complications: Secondary | ICD-10-CM

## 2012-02-03 DIAGNOSIS — Z125 Encounter for screening for malignant neoplasm of prostate: Secondary | ICD-10-CM

## 2012-02-03 DIAGNOSIS — M549 Dorsalgia, unspecified: Secondary | ICD-10-CM

## 2012-02-03 DIAGNOSIS — Z13220 Encounter for screening for lipoid disorders: Secondary | ICD-10-CM | POA: Diagnosis not present

## 2012-02-03 DIAGNOSIS — M545 Low back pain, unspecified: Secondary | ICD-10-CM

## 2012-02-03 DIAGNOSIS — Z79899 Other long term (current) drug therapy: Secondary | ICD-10-CM

## 2012-02-03 DIAGNOSIS — R634 Abnormal weight loss: Secondary | ICD-10-CM

## 2012-02-03 DIAGNOSIS — D649 Anemia, unspecified: Secondary | ICD-10-CM

## 2012-02-03 DIAGNOSIS — Z0189 Encounter for other specified special examinations: Secondary | ICD-10-CM

## 2012-02-03 MED ORDER — TRAMADOL HCL 50 MG PO TABS: 50.0000 mg | ORAL_TABLET | Freq: Two times a day (BID) | ORAL | Status: DC | PRN

## 2012-02-03 NOTE — Patient Instructions (Signed)
F/u IN 3 MONTHS We will call with x-ray results Please get your blood work done - do not eat after midnight Start the ultram twice a day as needed for back pain

## 2012-02-04 NOTE — Assessment & Plan Note (Signed)
Will follow with GI, established pt, doing well at this time

## 2012-02-04 NOTE — Assessment & Plan Note (Signed)
He has had problems with malnourishment and weight loss in the past, currently doing well, will follow with GI

## 2012-02-04 NOTE — Assessment & Plan Note (Signed)
Chronic back pain, exam reassuring. Obtain Lumbar x-ray, ultram PRN

## 2012-02-04 NOTE — Progress Notes (Signed)
  Subjective:    Patient ID: Daniel Howard, male    DOB: 1956-05-28, 56 y.o.   MRN: [id number]  HPI  Pt here to establish care, previous PCP Dr. Zada Girt Medication and history reviewed  1. Chron's disease, followed by GI on Imuran and iron supplements, previously on B 12 shots, has had multipel surgeries for chron's disease, has a ostomy bag currently. On Disability from Chron's  2. Back pain- chronic back pain, previous occupation farmer, seen in the ED a few months ago for recurrent pain, unable to take NSAIDS with Chron's disease  3. Multiple abdominal wall hernia's from surgeries and muscle weakness  4. Needs PSA checked, father had Prostate Cancer  Review of Systems  GEN- denies fatigue, fever, weight loss,weakness, recent illness HEENT- denies eye drainage, change in vision, nasal discharge, CVS- denies chest pain, palpitations, no leg edema RESP- denies SOB, cough, wheeze ABD- denies N/V, , +abd pain GU- denies dysuria, hematuria, dribbling, incontinence MSK- + joint pain, muscle aches, injury Neuro- denies headache, dizziness, syncope, seizure activity       Objective:   Physical Exam GEN- NAD, alert and oriented x3 HEENT- PERRL, EOMI, non injected sclera, pink conjunctiva, MMM, oropharynx clear Neck- Supple, no thyromegaly, no carotid bruit, normal ROM CVS- RRR, no murmur RESP-CTAB ABD- ostomy RLQ, NABS,  EXT- No edema Pulses- Radial, DP- 2+ MSK- mild TTP lumbar region, spine non tender, no paraspinal tenderness, neg SLR  lower ext strength equal bilat       Assessment & Plan:

## 2012-02-04 NOTE — Assessment & Plan Note (Signed)
Followed by GI on iron supplements OTC because of cost

## 2012-02-05 ENCOUNTER — Emergency Department (HOSPITAL_COMMUNITY): Payer: Medicare Other

## 2012-02-05 ENCOUNTER — Inpatient Hospital Stay (HOSPITAL_COMMUNITY)
Admission: EM | Admit: 2012-02-05 | Discharge: 2012-02-08 | DRG: 389 | Disposition: A | Discharge status: Home / Self Care | Payer: Medicare Other | Attending: Internal Medicine | Admitting: Internal Medicine

## 2012-02-05 ENCOUNTER — Encounter (HOSPITAL_COMMUNITY): Payer: Self-pay | Admitting: *Deleted

## 2012-02-05 DIAGNOSIS — K5000 Crohn's disease of small intestine without complications: Secondary | ICD-10-CM | POA: Diagnosis not present

## 2012-02-05 DIAGNOSIS — R109 Unspecified abdominal pain: Secondary | ICD-10-CM | POA: Diagnosis not present

## 2012-02-05 DIAGNOSIS — Z872 Personal history of diseases of the skin and subcutaneous tissue: Secondary | ICD-10-CM

## 2012-02-05 DIAGNOSIS — D638 Anemia in other chronic diseases classified elsewhere: Secondary | ICD-10-CM | POA: Diagnosis not present

## 2012-02-05 DIAGNOSIS — Z79899 Other long term (current) drug therapy: Secondary | ICD-10-CM

## 2012-02-05 DIAGNOSIS — IMO0002 Reserved for concepts with insufficient information to code with codable children: Secondary | ICD-10-CM

## 2012-02-05 DIAGNOSIS — D649 Anemia, unspecified: Secondary | ICD-10-CM | POA: Diagnosis not present

## 2012-02-05 DIAGNOSIS — K439 Ventral hernia without obstruction or gangrene: Secondary | ICD-10-CM | POA: Diagnosis not present

## 2012-02-05 DIAGNOSIS — Z880 Allergy status to penicillin: Secondary | ICD-10-CM

## 2012-02-05 DIAGNOSIS — R634 Abnormal weight loss: Secondary | ICD-10-CM

## 2012-02-05 DIAGNOSIS — K56609 Unspecified intestinal obstruction, unspecified as to partial versus complete obstruction: Secondary | ICD-10-CM | POA: Diagnosis not present

## 2012-02-05 DIAGNOSIS — Z87891 Personal history of nicotine dependence: Secondary | ICD-10-CM | POA: Diagnosis not present

## 2012-02-05 DIAGNOSIS — M545 Low back pain, unspecified: Secondary | ICD-10-CM | POA: Diagnosis not present

## 2012-02-05 DIAGNOSIS — K5289 Other specified noninfective gastroenteritis and colitis: Secondary | ICD-10-CM | POA: Diagnosis not present

## 2012-02-05 DIAGNOSIS — K5090 Crohn's disease, unspecified, without complications: Secondary | ICD-10-CM | POA: Diagnosis not present

## 2012-02-05 DIAGNOSIS — F411 Generalized anxiety disorder: Secondary | ICD-10-CM | POA: Diagnosis present

## 2012-02-05 DIAGNOSIS — Z932 Ileostomy status: Secondary | ICD-10-CM

## 2012-02-05 DIAGNOSIS — R110 Nausea: Secondary | ICD-10-CM | POA: Diagnosis not present

## 2012-02-05 HISTORY — DX: Unspecified abdominal hernia without obstruction or gangrene: K46.9

## 2012-02-05 LAB — CBC
HCT: 34.8 % — ABNORMAL LOW (ref 39.0–52.0)
MCHC: 34.5 g/dL (ref 30.0–36.0)
MCV: 96.4 fL (ref 78.0–100.0)
Platelets: 295 10*3/uL (ref 150–400)
RDW: 14.2 % (ref 11.5–15.5)
WBC: 8.3 10*3/uL (ref 4.0–10.5)

## 2012-02-05 LAB — BASIC METABOLIC PANEL
Calcium: 9.3 mg/dL (ref 8.4–10.5)
Creatinine, Ser: 1.17 mg/dL (ref 0.50–1.35)
GFR calc Af Amer: 80 mL/min — ABNORMAL LOW (ref >90)
GFR calc non Af Amer: 69 mL/min — ABNORMAL LOW (ref >90)
Sodium: 140 mEq/L (ref 135–145)

## 2012-02-05 LAB — DIFFERENTIAL
Basophils Absolute: 0 10*3/uL (ref 0.0–0.1)
Basophils Relative: 1 % (ref 0–1)
Eosinophils Absolute: 0.2 10*3/uL (ref 0.0–0.7)
Eosinophils Relative: 2 % (ref 0–5)
Lymphocytes Relative: 14 % (ref 12–46)
Monocytes Absolute: 0.7 10*3/uL (ref 0.1–1.0)

## 2012-02-05 LAB — LIPASE, BLOOD: Lipase: 17 U/L (ref 11–59)

## 2012-02-05 MED ORDER — IOHEXOL 300 MG/ML  SOLN
40.0000 mL | Freq: Once | INTRAMUSCULAR | Status: AC | PRN
  Administered 2012-02-05: 40 mL via INTRAVENOUS

## 2012-02-05 MED ORDER — SODIUM CHLORIDE 0.9 % IV SOLN
Freq: Once | INTRAVENOUS | Status: AC
  Administered 2012-02-05: 20 mL/h via INTRAVENOUS

## 2012-02-05 MED ORDER — IOHEXOL 300 MG/ML  SOLN
100.0000 mL | Freq: Once | INTRAMUSCULAR | Status: AC | PRN
  Administered 2012-02-05: 100 mL via INTRAVENOUS

## 2012-02-05 NOTE — ED Notes (Signed)
"  I have a colostomy, I need to pass gas"

## 2012-02-05 NOTE — H&P (Addendum)
Daniel Howard is an 56 y.o. male.    PCP: Vic Blackbird, MD, MD   Gastroenterologist is Dr. Gala Romney  Chief Complaint: Unable to pass gas, along with abdominal pain  HPI: This is 56 year old, African American male, with a past medical history Crohn's disease, and he is status post multiple abdominal surgeries including ileostomy. He tells me that he was in his usual state of health earlier today when he started having pain in his back which is chronic. However, also started having abdominal pain. He was unable to pass any gas from his ileostomy site, but has noticed a brownish stool, which was watery, which is his usual. The pain in the abdomen was described from 8-10 out of 10 in intensity. Denies any nausea, vomiting. Denies any fever or chills. Denies any recent surgical procedures.   Home Medications: Prior to Admission medications   Medication Sig Start Date End Date Taking? Authorizing Provider  acetaminophen (TYLENOL) 500 MG tablet Take 1,000 mg by mouth as needed. For pain     Historical Provider, MD  azaTHIOprine (IMURAN) 50 MG tablet Take 50-75 mg by mouth 2 (two) times daily. Takes 50 mg in the morning and 75 mg in the evening  08/29/11   Neil Crouch, PA  Calcium Carbonate-Vit D-Min (CALCIUM 1200 PO) Take 1,200 mg by mouth daily.     Historical Provider, MD  ferrous gluconate (FERGON) 325 MG tablet Take 325 mg by mouth daily with breakfast.      Historical Provider, MD  Multiple Vitamins-Minerals (MULTIVITAMINS THER. W/MINERALS) TABS Take 1 tablet by mouth daily.      Historical Provider, MD  traMADol (ULTRAM) 50 MG tablet Take 1 tablet (50 mg total) by mouth 2 (two) times daily as needed for pain. 02/03/12 02/02/13  Vic Blackbird, MD    Allergies:  Allergies  Allergen Reactions  . Penicillins Hives    Past Medical History: Past Medical History  Diagnosis Date  . Anxiety   . Low back pain   . 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 Abnormal finding of biliary tract     MRCP shows pancreatic/biliary tract dilation  . Spigelian hernia     Past Surgical History  Procedure Date  . Colostomy   . Colectomy   . Hernia repair     incarcerated periostial hernia    Social History:  reports that he quit smoking about 2 years ago. His smoking use included Cigarettes. He has a 1 pack-year smoking history. He does not have any smokeless tobacco history on file. He reports that he does not drink alcohol or use illicit drugs.  Family History:  Family History  Problem Relation Age of Onset  . Cancer Father     prostate   . Prostate cancer Father   . Hypertension Sister   . Cancer Sister   . Depression Sister   . Breast cancer Sister   . COPD Sister     Review of Systems - History obtained from the patient General ROS: positive for  - fatigue Psychological ROS: negative Ophthalmic ROS: negative ENT ROS: negative Allergy and Immunology ROS: negative Hematological and Lymphatic ROS: negative Endocrine ROS: negative Respiratory ROS: negative Cardiovascular ROS: negative Gastrointestinal ROS: as in hpi Genito-Urinary ROS: negative Musculoskeletal ROS: negative Neurological ROS: negative Dermatological ROS: negative  Physical Examination Blood pressure 111/71, pulse 81,  temperature 98.4 F (36.9 C), temperature source Tympanic, resp. rate 20, height 5' 4"  (1.626 m), weight 67.132 kg (148 lb), SpO2 100.00%.  General appearance: alert, cooperative, appears stated age and no distress Head: Normocephalic, without obvious abnormality, atraumatic Eyes: conjunctivae/corneas clear. PERRL, EOM's intact. Fundi benign. Throat: lips, mucosa, and tongue normal; teeth and gums normal Neck: no adenopathy, no carotid bruit, no JVD, supple, symmetrical, trachea midline and thyroid not enlarged, symmetric, no  tenderness/mass/nodules Resp: clear to auscultation bilaterally Cardio: regular rate and rhythm, S1, S2 normal, no murmur, click, rub or gallop GI: Soft, multiple scars from previous surgeries. Ileostomy, showing yellowish-brown stool. No blood noted. Abdomen is tender diffusely, without any rebound, rigidity, or guarding. Bowel sounds are present but sluggish. No masses, organomegaly appreciated. Extremities: extremities normal, atraumatic, no cyanosis or edema Pulses: 2+ and symmetric Skin: Skin color, texture, turgor normal. No rashes or lesions Lymph nodes: Cervical, supraclavicular, and axillary nodes normal. Neurologic: Grossly normal  Laboratory Data: Results for orders placed during the hospital encounter of 02/05/12 (from the past 48 hour(s))  CBC     Status: Abnormal   Collection Time   02/05/12  6:43 PM      Component Value Range Comment   WBC 8.3  4.0 - 10.5 (K/uL)    RBC 3.61 (*) 4.22 - 5.81 (MIL/uL)    Hemoglobin 12.0 (*) 13.0 - 17.0 (g/dL)    HCT 34.8 (*) 39.0 - 52.0 (%)    MCV 96.4  78.0 - 100.0 (fL)    MCH 33.2  26.0 - 34.0 (pg)    MCHC 34.5  30.0 - 36.0 (g/dL)    RDW 14.2  11.5 - 15.5 (%)    Platelets 295  150 - 400 (K/uL)   DIFFERENTIAL     Status: Normal   Collection Time   02/05/12  6:43 PM      Component Value Range Comment   Neutrophils Relative 75  43 - 77 (%)    Neutro Abs 6.2  1.7 - 7.7 (K/uL)    Lymphocytes Relative 14  12 - 46 (%)    Lymphs Abs 1.2  0.7 - 4.0 (K/uL)    Monocytes Relative 9  3 - 12 (%)    Monocytes Absolute 0.7  0.1 - 1.0 (K/uL)    Eosinophils Relative 2  0 - 5 (%)    Eosinophils Absolute 0.2  0.0 - 0.7 (K/uL)    Basophils Relative 1  0 - 1 (%)    Basophils Absolute 0.0  0.0 - 0.1 (K/uL)   BASIC METABOLIC PANEL     Status: Abnormal   Collection Time   02/05/12  6:43 PM      Component Value Range Comment   Sodium 140  135 - 145 (mEq/L)    Potassium 3.8  3.5 - 5.1 (mEq/L)    Chloride 102  96 - 112 (mEq/L)    CO2 30  19 - 32 (mEq/L)     Glucose, Bld 91  70 - 99 (mg/dL)    BUN 11  6 - 23 (mg/dL)    Creatinine, Ser 1.17  0.50 - 1.35 (mg/dL)    Calcium 9.3  8.4 - 10.5 (mg/dL)    GFR calc non Af Amer 69 (*) >90 (mL/min)    GFR calc Af Amer 80 (*) >90 (mL/min)     Radiology Reports: Dg Abd Acute W/chest  02/05/2012  *RADIOLOGY REPORT*  Clinical Data: Abdominal pain.  History of Crohn's disease.  ACUTE ABDOMEN SERIES (ABDOMEN  2 VIEW & CHEST 1 VIEW)  Comparison: CT scan 12/12/2011.  Findings: The upright chest x-ray demonstrates no acute pulmonary findings.  Minimal areas of streaky atelectasis but no effusions or infiltrates.  Two views of the abdomen demonstrate scattered slightly dilated air filled small bowel loops with air fluid levels suggestive of a small bowel obstruction.  No free air.  A right lower quadrant colostomy is noted.  IMPRESSION:  1.  No acute cardiopulmonary findings. 2.  Abdominal films suggest a small bowel obstruction.  Original Report Authenticated By: P. Kalman Jewels, M.D.    Assessment/Plan  Principal Problem:  *SBO (small bowel obstruction) Active Problems:  ANEMIA  ABDOMINAL WALL HERNIA  CROHN'S DISEASE   #1 possible small bowel obstruction: We will further evaluate this using a CT scan of his abdomen and pelvis to rule out any kind of hernia incarceration. GI and general surgery has been notified and they will evaluate the patient in the morning. We'll keep him n.p.o. in the interim. Since he does not have any vomiting we will hold off on NG tube at this time.  #2 history of Crohn's disease: This is probably playing a big role in the above presentation. Gastroenterology will be asked to see this patient.  Patient is a full code.  DVT, prophylaxis will be initiated.  Further management decisions will depend on results of further testing and patient's response to treatment  [facility] Pager [phone]  02/05/2012, 8:50 PM

## 2012-02-05 NOTE — ED Provider Notes (Signed)
History     CSN: [id number]  Arrival date & time 02/05/12  1804   First MD Initiated Contact with Patient 02/05/12 1818      Chief Complaint  Patient presents with  . Abdominal Pain    (Consider location/radiation/quality/duration/timing/severity/associated sxs/prior treatment) Patient is a 56 y.o. male presenting with abdominal pain. The history is provided by the patient.  Abdominal Pain The primary symptoms of the illness include abdominal pain. The primary symptoms of the illness do not include shortness of breath, nausea, vomiting or diarrhea.  Symptoms associated with the illness do not include back pain.   patient has an ileostomy for Crohn's disease. He states his head decreased gas output into her partner today. He states he still having stool,. He states he feels as if his abdomen is getting little bit bigger. He's had some abdominal tightness and pain with it. No fevers. No nausea or vomiting. He states normally his bag is full of air and is not her coming out.  Past Medical History  Diagnosis Date  . Anxiety   . Low back pain   . 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 Abnormal finding of biliary tract     MRCP shows pancreatic/biliary tract dilation  . Spigelian hernia     Past Surgical History  Procedure Date  . Colostomy   . Colectomy   . Hernia repair     incarcerated periostial hernia    Family History  Problem Relation Age of Onset  . Cancer Father     prostate   . Prostate cancer Father   . Hypertension Sister   . Cancer Sister   . Depression Sister   . Breast cancer Sister   . COPD Sister     History  Substance Use Topics  . Smoking status: Former Smoker -- 0.5 packs/day for 2 years    Types: Cigarettes    Quit date: 12/21/2009  . Smokeless tobacco: Not on file  . Alcohol Use: No       Former drinker      Review of Systems  Constitutional: Negative for activity change and appetite change.  HENT: Negative for neck stiffness.   Eyes: Negative for pain.  Respiratory: Negative for chest tightness and shortness of breath.   Cardiovascular: Negative for chest pain and leg swelling.  Gastrointestinal: Positive for abdominal pain and abdominal distention. Negative for nausea, vomiting and diarrhea.  Genitourinary: Negative for flank pain.  Musculoskeletal: Negative for back pain.  Skin: Negative for rash.  Neurological: Negative for weakness, numbness and headaches.  Psychiatric/Behavioral: Negative for behavioral problems.    Allergies  Penicillins  Home Medications   Current Outpatient Rx  Name Route Sig Dispense Refill  . ACETAMINOPHEN 500 MG PO TABS Oral Take 1,000 mg by mouth as needed. For pain     . AZATHIOPRINE 50 MG PO TABS Oral Take 50-75 mg by mouth 2 (two) times daily. Takes 50 mg in the morning and 75 mg in the evening     . CALCIUM 1200 PO Oral Take 1,200 mg by mouth daily.     [name] FERROUS GLUCONATE 325 MG PO TABS Oral Take 325 mg by mouth daily with breakfast.      . THERA M PLUS PO TABS Oral Take 1 tablet by mouth daily.      . TRAMADOL HCL 50 MG  PO TABS Oral Take 1 tablet (50 mg total) by mouth 2 (two) times daily as needed for pain. 45 tablet 1    BP 111/71  Pulse 81  Temp(Src) 98.4 F (36.9 C) (Tympanic)  Resp 20  Ht 5' 4"  (1.626 m)  Wt 148 lb (67.132 kg)  BMI 25.40 kg/m2  SpO2 100%  Physical Exam  Nursing note and vitals reviewed. Constitutional: He is oriented to person, place, and time. He appears well-developed and well-nourished.  HENT:  Head: Normocephalic and atraumatic.  Eyes: EOM are normal. Pupils are equal, round, and reactive to light.  Neck: Normal range of motion. Neck supple.  Cardiovascular: Normal rate, regular rhythm and normal heart sounds.   No murmur heard. Pulmonary/Chest: Effort normal and breath sounds  normal.  Abdominal: Soft. Bowel sounds are normal. He exhibits no distension and no mass. There is no tenderness. There is no rebound and no guarding.        Patient has had multiple previous surgeries to his abdomen. His right lower quadrant ileostomy. There is herniation of the ileum through the stoma. He states his been this way for a while. There is stool in the bag and at the ileum. There is no parastomal hernia palpated. There is no obstruction to the depth of my little finger in the ostomy. There is brown stool on the back. His abdomen is somewhat irregular, but not distended. There is no clear incarcerated hernias felt.  Musculoskeletal: Normal range of motion. He exhibits no edema.  Neurological: He is alert and oriented to person, place, and time. No cranial nerve deficit.  Skin: Skin is warm and dry.  Psychiatric: He has a normal mood and affect.    ED Course  Procedures (including critical care time)  Labs Reviewed  CBC - Abnormal; Notable for the following:    RBC 3.61 (*)    Hemoglobin 12.0 (*)    HCT 34.8 (*)    All other components within normal limits  BASIC METABOLIC PANEL - Abnormal; Notable for the following:    GFR calc non Af Amer 69 (*)    GFR calc Af Amer 80 (*)    All other components within normal limits  DIFFERENTIAL  LIPASE, BLOOD   Dg Abd Acute W/chest  02/05/2012  *RADIOLOGY REPORT*  Clinical Data: Abdominal pain.  History of Crohn's disease.  ACUTE ABDOMEN SERIES (ABDOMEN 2 VIEW & CHEST 1 VIEW)  Comparison: CT scan 12/12/2011.  Findings: The upright chest x-ray demonstrates no acute pulmonary findings.  Minimal areas of streaky atelectasis but no effusions or infiltrates.  Two views of the abdomen demonstrate scattered slightly dilated air filled small bowel loops with air fluid levels suggestive of a small bowel obstruction.  No free air.  A right lower quadrant colostomy is noted.  IMPRESSION:  1.  No acute cardiopulmonary findings. 2.  Abdominal films  suggest a small bowel obstruction.  Original Report Authenticated By: P. Kalman Jewels, M.D.     1. SBO (small bowel obstruction)   2. CROHN'S DISEASE       MDM  Patient with decreased gaseous output of his ileostomy. Still having stool coming out. No vomiting. He states his abdomen is feeling distended. X-ray shows bowel obstruction. Discussed with surgery Dr. Geroge Baseman, but does not think a CT  is needed at this time. I also discussed with GI who recommended medicine admission. Dr. [place] Pink will admit the patient.        Jasper Riling. Alvino Chapel, MD 02/05/12 2050

## 2012-02-06 ENCOUNTER — Encounter (HOSPITAL_COMMUNITY): Payer: Self-pay | Admitting: Gastroenterology

## 2012-02-06 DIAGNOSIS — K56609 Unspecified intestinal obstruction, unspecified as to partial versus complete obstruction: Secondary | ICD-10-CM

## 2012-02-06 DIAGNOSIS — K5090 Crohn's disease, unspecified, without complications: Secondary | ICD-10-CM

## 2012-02-06 LAB — COMPREHENSIVE METABOLIC PANEL
AST: 19 U/L (ref 0–37)
Albumin: 2.9 g/dL — ABNORMAL LOW (ref 3.5–5.2)
Alkaline Phosphatase: 54 U/L (ref 39–117)
Chloride: 99 mEq/L (ref 96–112)
Potassium: 3.7 mEq/L (ref 3.5–5.1)
Total Bilirubin: 0.7 mg/dL (ref 0.3–1.2)
Total Protein: 6.9 g/dL (ref 6.0–8.3)

## 2012-02-06 LAB — CBC
HCT: 36.6 % — ABNORMAL LOW (ref 39.0–52.0)
MCH: 33.2 pg (ref 26.0–34.0)
MCV: 95.6 fL (ref 78.0–100.0)
Platelets: 286 10*3/uL (ref 150–400)
RBC: 3.83 MIL/uL — ABNORMAL LOW (ref 4.22–5.81)
RDW: 14.2 % (ref 11.5–15.5)

## 2012-02-06 MED ORDER — ONDANSETRON HCL 4 MG/2ML IJ SOLN
4.0000 mg | Freq: Four times a day (QID) | INTRAMUSCULAR | Status: DC | PRN
  Filled 2012-02-06: qty 2

## 2012-02-06 MED ORDER — DEXTROSE-NACL 5-0.45 % IV SOLN
INTRAVENOUS | Status: DC
  Administered 2012-02-06: 1000 mL via INTRAVENOUS
  Administered 2012-02-06 (×2): via INTRAVENOUS
  Administered 2012-02-07: 1000 mL via INTRAVENOUS

## 2012-02-06 MED ORDER — ONDANSETRON HCL 4 MG PO TABS: 4.0000 mg | ORAL_TABLET | Freq: Four times a day (QID) | ORAL | Status: DC | PRN

## 2012-02-06 MED ORDER — ENOXAPARIN SODIUM 40 MG/0.4ML ~~LOC~~ SOLN
40.0000 mg | SUBCUTANEOUS | Status: DC
  Administered 2012-02-06 – 2012-02-07 (×2): 40 mg via SUBCUTANEOUS
  Filled 2012-02-06 (×3): qty 0.4

## 2012-02-06 MED ORDER — HYDROMORPHONE HCL PF 1 MG/ML IJ SOLN
1.0000 mg | INTRAMUSCULAR | Status: DC | PRN
  Administered 2012-02-06 (×2): 1 mg via INTRAVENOUS
  Filled 2012-02-06 (×2): qty 1

## 2012-02-06 MED ORDER — AZATHIOPRINE 50 MG PO TABS
50.0000 mg | ORAL_TABLET | Freq: Every day | ORAL | Status: DC
  Administered 2012-02-07 – 2012-02-08 (×2): 50 mg via ORAL
  Filled 2012-02-06 (×3): qty 1

## 2012-02-06 MED ORDER — AZATHIOPRINE 50 MG PO TABS
75.0000 mg | ORAL_TABLET | Freq: Every day | ORAL | Status: DC
  Administered 2012-02-06 – 2012-02-07 (×2): 75 mg via ORAL
  Filled 2012-02-06 (×3): qty 2

## 2012-02-06 MED ORDER — SODIUM CHLORIDE 0.9 % IJ SOLN
INTRAMUSCULAR | Status: AC
  Administered 2012-02-06: 10 mL
  Filled 2012-02-06: qty 3

## 2012-02-06 MED ORDER — ALBUTEROL SULFATE (5 MG/ML) 0.5% IN NEBU: 2.5000 mg | INHALATION_SOLUTION | RESPIRATORY_TRACT | Status: DC | PRN

## 2012-02-06 NOTE — Consult Note (Signed)
Reason for Consult: Small bowel obstruction Referring Physician: Dr. Shaune Pollack is an 56 y.o. male.  HPI: Patient presented with approximately two-day history of decreased gas in his ostomy pouch as well as midline colicky abdominal pain with radiation to bilateral flanks and back. He denies any nausea. No emesis. No change in bowel movements and actually has been having bowel movements every day. No melena no hematochezia. No change in urination. No fevers or chills. No similar symptomatology in the past.  Past Medical History  Diagnosis Date  . Anxiety   . Low back pain   . 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 Abnormal finding of biliary tract     MRCP shows pancreatic/biliary tract dilation. EUS 2010 confirmed dilation but no chronic pancreaitis or mass. Vascular ectasia crimpoing distal CBD.   [name] Spigelian hernia     bilateral  . Duodenal ulcer 2010    nsaids  . Peristomal hernia     Past Surgical History  Procedure Date  . Colostomy   . Colectomy   . Hernia repair     incarcerated periostial hernia  . Esophagogastroduodenoscopy 05/2009    multiple antral erosions, large ulcer at ansatomosis (postsurgical changes at duodenal bulb and second portion of duodenum) BX c/x NSAIDS.    Family History  Problem Relation Age of Onset  . Cancer Father     prostate   . Prostate cancer Father   . Hypertension Sister   . Cancer Sister   . Depression Sister   . Breast cancer Sister   . COPD Sister   . Aneurysm Brother     deceased, brain aneurysm    Social History:  reports that he quit smoking about 2 years ago. His smoking use included Cigarettes. He has a 1 pack-year smoking history. He does not have any smokeless tobacco history on file. He reports that he does not drink alcohol or use illicit  drugs.  Allergies:  Allergies  Allergen Reactions  . Penicillins Hives    Medications:  I have reviewed the patient's current medications. Prior to Admission:  Prescriptions prior to admission  . Order #: [id number] Class: Historical Med  . Order #: [id number] Class: Historical Med  . Order #: [id number] Class: Historical Med  . Order #: [id number] Class: Historical Med  . Order #: [id number] Class: Historical Med  . Order #: [id number] Class: Normal   Scheduled:   . sodium chloride   Intravenous Once  . enoxaparin  40 mg Subcutaneous Q24H  . sodium chloride       Continuous:   . dextrose 5 % and 0.45% NaCl 100 mL/hr at 02/06/12 1020   XNA:TFTDDUKGU, HYDROmorphone, iohexol, iohexol, ondansetron (ZOFRAN) IV, ondansetron  Results for orders placed during the hospital encounter of 02/05/12 (from the past 48 hour(s))  CBC     Status: Abnormal   Collection Time   02/05/12  6:43 PM      Component Value Range Comment   WBC 8.3  4.0 - 10.5 (K/uL)    RBC 3.61 (*) 4.22 - 5.81 (MIL/uL)    Hemoglobin 12.0 (*) 13.0 - 17.0 (g/dL)    HCT 34.8 (*) 39.0 - 52.0 (%)    MCV 96.4  78.0 - 100.0 (fL)    MCH 33.2  26.0 - 34.0 (pg)    MCHC 34.5  30.0 -  36.0 (g/dL)    RDW 14.2  11.5 - 15.5 (%)    Platelets 295  150 - 400 (K/uL)   DIFFERENTIAL     Status: Normal   Collection Time   02/05/12  6:43 PM      Component Value Range Comment   Neutrophils Relative 75  43 - 77 (%)    Neutro Abs 6.2  1.7 - 7.7 (K/uL)    Lymphocytes Relative 14  12 - 46 (%)    Lymphs Abs 1.2  0.7 - 4.0 (K/uL)    Monocytes Relative 9  3 - 12 (%)    Monocytes Absolute 0.7  0.1 - 1.0 (K/uL)    Eosinophils Relative 2  0 - 5 (%)    Eosinophils Absolute 0.2  0.0 - 0.7 (K/uL)    Basophils Relative 1  0 - 1 (%)    Basophils Absolute 0.0  0.0 - 0.1 (K/uL)   BASIC METABOLIC PANEL     Status: Abnormal   Collection Time   02/05/12  6:43 PM      Component Value Range Comment   Sodium 140  135 - 145 (mEq/L)    Potassium 3.8  3.5 - 5.1  (mEq/L)    Chloride 102  96 - 112 (mEq/L)    CO2 30  19 - 32 (mEq/L)    Glucose, Bld 91  70 - 99 (mg/dL)    BUN 11  6 - 23 (mg/dL)    Creatinine, Ser 1.17  0.50 - 1.35 (mg/dL)    Calcium 9.3  8.4 - 10.5 (mg/dL)    GFR calc non Af Amer 69 (*) >90 (mL/min)    GFR calc Af Amer 80 (*) >90 (mL/min)   LIPASE, BLOOD     Status: Normal   Collection Time   02/05/12  8:39 PM      Component Value Range Comment   Lipase 17  11 - 59 (U/L)   COMPREHENSIVE METABOLIC PANEL     Status: Abnormal   Collection Time   02/06/12  5:01 AM      Component Value Range Comment   Sodium 136  135 - 145 (mEq/L)    Potassium 3.7  3.5 - 5.1 (mEq/L)    Chloride 99  96 - 112 (mEq/L)    CO2 29  19 - 32 (mEq/L)    Glucose, Bld 106 (*) 70 - 99 (mg/dL)    BUN 10  6 - 23 (mg/dL)    Creatinine, Ser 1.22  0.50 - 1.35 (mg/dL)    Calcium 9.0  8.4 - 10.5 (mg/dL)    Total Protein 6.9  6.0 - 8.3 (g/dL)    Albumin 2.9 (*) 3.5 - 5.2 (g/dL)    AST 19  0 - 37 (U/L)    ALT 10  0 - 53 (U/L)    Alkaline Phosphatase 54  39 - 117 (U/L)    Total Bilirubin 0.7  0.3 - 1.2 (mg/dL)    GFR calc non Af Amer 66 (*) >90 (mL/min)    GFR calc Af Amer 76 (*) >90 (mL/min)   CBC     Status: Abnormal   Collection Time   02/06/12  5:01 AM      Component Value Range Comment   WBC 6.0  4.0 - 10.5 (K/uL)    RBC 3.83 (*) 4.22 - 5.81 (MIL/uL)    Hemoglobin 12.7 (*) 13.0 - 17.0 (g/dL)    HCT 36.6 (*) 39.0 - 52.0 (%)    MCV  95.6  78.0 - 100.0 (fL)    MCH 33.2  26.0 - 34.0 (pg)    MCHC 34.7  30.0 - 36.0 (g/dL)    RDW 14.2  11.5 - 15.5 (%)    Platelets 286  150 - 400 (K/uL)     Ct Abdomen Pelvis W Contrast  02/05/2012  *RADIOLOGY REPORT*  Clinical Data: Abdominal pain, small bowel obstruction.  Previous history of Crohn disease with transverse loop colostomy, colectomy, spigelian hernia, and hernia repair.  CT ABDOMEN AND PELVIS WITH CONTRAST  Technique:  Multidetector CT imaging of the abdomen and pelvis was performed following the standard  protocol during bolus administration of intravenous contrast.  Contrast: 1m OMNIPAQUE IOHEXOL 300 MG/ML IV SOLN, 1030mOMNIPAQUE IOHEXOL 300 MG/ML IV SOLN  Comparison: 12/12/2011  Findings: Atelectasis in the lung bases.  There is mild intrahepatic and moderate extrahepatic bile duct dilatation associated with pancreatic ductal dilatation.  The appearance is similar to the previous study.  No specific obstructing mass or stone are visualized.  Occult obstructing lesion is not excluded.  No focal parenchymal lesions in the liver. Spleen size is normal.  Gallbladder is unremarkable.  No adrenal gland nodules.  Kidneys are symmetrical without mass or hydronephrosis.  Scattered celiac axis, mesenteric, and retroperitoneal lymph nodes are not pathologically enlarged. Enlarged right lower quadrant lymph nodes are present, measuring up to about 13 mm diameter.  These are likely reactive.  There is a left lateral abdominal wall hernia arising between the junction of the flank muscles and to the rectus abdominous muscle. This contains multiple loops of small bowel.  There is a small amount of fluid and infiltration in the hernia.  There is a transition zone present with mild proximal small bowel dilatation and distal small bowel decompression within the hernia.  This is consistent with small bowel obstruction.  There is a right lower quadrant colostomy.  The proximal colon is distended with thickened wall and stool.  The distal colon is decompressed.  Mild fluid in the right pericolic gutter.  No free air in the abdomen.  Pelvis:  Enlarged prostate gland.  No bladder wall thickening.  No loculated pelvic fluid collection to suggest abscess.  No significant pelvic lymphadenopathy.  Degenerative changes in the lumbar spine.  IMPRESSION: Left flank hernia with herniation of small bowel and change in caliber consistent with obstruction.  Right lower quadrant ostomy with inflammatory changes and stool stasis in the colon.   Reactive inflammatory lymph nodes.  Biliary and pancreatic ductal dilatation of uncertain etiology. This appears stable since previous study.  Original Report Authenticated By: WINeale BurlyM.D.   Dg Abd Acute W/chest  02/05/2012  *RADIOLOGY REPORT*  Clinical Data: Abdominal pain.  History of Crohn's disease.  ACUTE ABDOMEN SERIES (ABDOMEN 2 VIEW & CHEST 1 VIEW)  Comparison: CT scan 12/12/2011.  Findings: The upright chest x-ray demonstrates no acute pulmonary findings.  Minimal areas of streaky atelectasis but no effusions or infiltrates.  Two views of the abdomen demonstrate scattered slightly dilated air filled small bowel loops with air fluid levels suggestive of a small bowel obstruction.  No free air.  A right lower quadrant colostomy is noted.  IMPRESSION:  1.  No acute cardiopulmonary findings. 2.  Abdominal films suggest a small bowel obstruction.  Original Report Authenticated By: P. MAKalman JewelsM.D.    Review of Systems  Constitutional: Negative for fever and chills.  HENT: Negative.   Eyes: Negative.   Respiratory: Negative.   Cardiovascular: Negative.  Gastrointestinal: Positive for abdominal pain (midline with radiation route back, colicky in nature. Has improved.). Negative for heartburn, nausea, vomiting, diarrhea, constipation, blood in stool and melena.  Genitourinary: Negative.   Musculoskeletal: Negative.   Skin: Negative.   Neurological: Negative.   Endo/Heme/Allergies: Negative.   Psychiatric/Behavioral: Negative.    Blood pressure 94/60, pulse 81, temperature 98.4 F (36.9 C), temperature source Oral, resp. rate 18, height 5' 4"  (1.626 m), weight 65.6 kg (144 lb 10 oz), SpO2 99.00%. Physical Exam  Constitutional: He is oriented to person, place, and time. He appears well-nourished. No distress.       Thin, multiple abdominal scars  HENT:  Head: Normocephalic and atraumatic.  Eyes: Conjunctivae and EOM are normal. Pupils are equal, round, and reactive to  light. No scleral icterus.  Neck: Normal range of motion. Neck supple. No tracheal deviation present. No thyromegaly present.  Cardiovascular: Normal rate, regular rhythm and normal heart sounds.   Respiratory: Effort normal and breath sounds normal. No respiratory distress.  GI: Soft. Bowel sounds are normal. He exhibits no distension and no mass. There is no tenderness. There is no rebound and no guarding.       Flat, palpable left flank hernia. Ostomy is patent. Significant amount of stool within pouch.  Lymphadenopathy:    He has no cervical adenopathy.  Neurological: He is alert and oriented to person, place, and time.  Skin: Skin is warm and dry.    Assessment/Plan: Abdominal pain. At this point clinically patient does not have a bowel obstruction. He does have normal ostomy function with actually 2 changes of his ostomy pouch today with it again nearly filled. His exam is benign. He does have a palpable left flank hernia but after discussing with patient this appears to be a long-standing hernia and is not appear to be acutely involved clinically with his current presentation. While hernia repair may be an option in the future I do not feel that there is any urgency due to its long standing presentation. At this point I think is reasonable to consider resumption of a clear liquid diet and slow advancement as tolerated. If patient tolerates a regular diet and continues to have ostomy function consider discharge to home. No acute surgical indications.  Aayush Gelpi C 02/06/2012, 12:09 PM

## 2012-02-06 NOTE — Consult Note (Signed)
Referring Provider: Kathie Dike, MD Primary Care Physician:  Vic Blackbird, MD, MD Primary Gastroenterologist:  Garfield Cornea, MD  Reason for Consultation:  SBO, Crohn's  HPI: Daniel Howard is a 56 y.o. male who presented with complaints of back and abdominal pain. States that he did not have gas coming out of his ostomy bag as he usually does. Symptoms started a couple of days before admission. 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 Currently with ileostomy in the right abdomen. He was last seen by [place] in December 2012 at which time he is doing very well. Ever last 6 months we did increase his Imuran dose. Patient states he typically increases ostomy bag about 3 times daily. Usually has a air and dark green stool present. Denies blood in the back or melena. Denies nausea or vomiting. Typically without chronic abdominal pain. Over the course the last couple of days he noted back pain and mid abdominal pain. Denies dysuria, fever or chills. States his weight has been stable.     Prior to Admission medications   Medication Sig Start Date End Date Taking? Authorizing Provider  acetaminophen (TYLENOL) 500 MG tablet Take 1,000 mg by mouth as needed. For pain    Yes Historical Provider, MD  azaTHIOprine (IMURAN) 50 MG tablet Take 50-75 mg by mouth 2 (two) times daily. Takes 50 mg in the morning and 75 mg in the evening  08/29/11  Yes Neil Crouch, PA  Calcium Carbonate-Vit D-Min (CALCIUM 1200 PO) Take 1,200 mg by mouth daily.    Yes Historical Provider, MD  ferrous gluconate (FERGON) 325 MG tablet Take 325 mg by mouth daily with breakfast.     Yes Historical Provider, MD  Multiple Vitamins-Minerals (MULTIVITAMINS THER. W/MINERALS) TABS Take 1 tablet by mouth daily.     Yes Historical Provider, MD  traMADol (ULTRAM) 50 MG tablet Take 1 tablet (50 mg total) by mouth 2 (two) times daily as needed for pain. 02/03/12 02/02/13 Yes Vic Blackbird, MD    Current  Facility-Administered Medications  Medication Dose Route Frequency Provider Last Rate Last Dose  . 0.9 %  sodium chloride infusion   Intravenous Once [employer]. Olin Hauser, MD 20 mL/hr at 02/05/12 2257 20 mL/hr at 02/05/12 2257  . albuterol (PROVENTIL) (5 MG/ML) 0.5% nebulizer solution 2.5 mg  2.5 mg Nebulization Q2H PRN Bonnielee Haff, MD      . dextrose 5 %-0.45 % sodium chloride infusion   Intravenous Continuous Bonnielee Haff, MD 100 mL/hr at 02/06/12 0117    . enoxaparin (LOVENOX) injection 40 mg  40 mg Subcutaneous Q24H Bonnielee Haff, MD   40 mg at 02/06/12 0506  . HYDROmorphone (DILAUDID) injection 1 mg  1 mg Intravenous Q3H PRN Bonnielee Haff, MD   1 mg at 02/06/12 0849  . iohexol (OMNIPAQUE) 300 MG/ML solution 100 mL  100 mL Intravenous Once PRN Medication Radiologist, MD   100 mL at 02/05/12 2237  . iohexol (OMNIPAQUE) 300 MG/ML solution 40 mL  40 mL Intravenous Once PRN Medication Radiologist, MD   40 mL at 02/05/12 2145  . ondansetron (ZOFRAN) tablet 4 mg  4 mg Oral Q6H PRN Bonnielee Haff, MD       Or  . ondansetron [facility]) injection 4 mg  4 mg Intravenous Q6H PRN Bonnielee Haff, MD      . sodium chloride 0.9 % injection        10 mL at 02/06/12 0358    Allergies as of 02/05/2012 -  Review Complete 02/05/2012  Allergen Reaction Noted  . Penicillins Hives 01/25/2007    Past Medical History  Diagnosis Date  . Anxiety   . Low back pain   . 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 Abnormal finding of biliary tract     MRCP shows pancreatic/biliary tract dilation  . Spigelian hernia     Past Surgical History  Procedure Date  . Colostomy   . Colectomy   . Hernia repair     incarcerated periostial hernia    Family History  Problem Relation Age of Onset  . Cancer Father     prostate   . Prostate cancer Father   .  Hypertension Sister   . Cancer Sister   . Depression Sister   . Breast cancer Sister   . COPD Sister   . Aneurysm Brother     deceased, brain aneurysm    History   Social History  . Marital Status: Married    Spouse Name: [name]    Number of Children: 51  . Years of Education: [name]   Occupational History  . disabled    Social History Main Topics  . Smoking status: Former Smoker -- 0.5 packs/day for 2 years    Types: Cigarettes    Quit date: 12/21/2009  . Smokeless tobacco: Not on file  . Alcohol Use: No     Former drinker  . Drug Use: No  . Sexually Active: Yes -- Male partner(s)   Other Topics Concern  . Not on file   Social History Narrative  . No narrative on file     ROS:  General: Negative for anorexia, weight loss, fever, chills, fatigue, weakness. Eyes: Negative for vision changes.  ENT: Negative for hoarseness, difficulty swallowing , nasal congestion. CV: Negative for chest pain, angina, palpitations, dyspnea on exertion, peripheral edema.  Respiratory: Negative for dyspnea at rest, dyspnea on exertion, cough, sputum, wheezing.  GI: See history of present illness. States he only passes mucous from his rectum. GU:  Negative for dysuria, hematuria, urinary incontinence, urinary frequency, nocturnal urination.  MS: Negative for joint pain. Chronic low back pain.  Derm: Negative for rash or itching.  Neuro: Negative for weakness, abnormal sensation, seizure, frequent headaches, memory loss, confusion.  Psych: Negative for anxiety, depression, suicidal ideation, hallucinations.  Endo: Negative for unusual weight change.  Heme: Negative for bruising or bleeding. Allergy: Negative for rash or hives.       Physical Examination: Vital signs in last 24 hours: Temp:  [98.4 F (36.9 C)-98.8 F (37.1 C)] 98.4 F (36.9 C) (02/15 0634) Pulse Rate:  [74-81] 81  (02/15 0829) Resp:  [18-20] 18  (02/15 0829) BP: (94-111)/(59-71) 94/60 mmHg (02/15 0634) SpO2:  [97  %-100 %] 99 % (02/15 0829) Weight:  [144 lb 10 oz (65.6 kg)-148 lb (67.132 kg)] 144 lb 10 oz (65.6 kg) (02/14 2352) Last BM Date: 02/05/12  General: Well-nourished, well-developed in no acute distress.  Head: Normocephalic, atraumatic.   Eyes: Conjunctiva pink, no icterus. Mouth: Oropharyngeal mucosa moist and pink , no lesions erythema or exudate. Neck: Supple without thyromegaly, masses, or lymphadenopathy.  Lungs: Clear to auscultation bilaterally.  Heart: Regular rate and rhythm, no murmurs rubs or gallops.  Abdomen: Bowel sounds are normal, mild midline abdominal tenderness. Large left abdominal hernia with palpable bowel. Ostomy bag in the right abdomen  with large amount of green liquid stool. No hepatosplenomegaly, no abdominal bruits. No rebound or guarding.   Rectal: Not performed. Extremities: No lower extremity edema, clubbing, deformity.  Neuro: Alert and oriented x 4 , grossly normal neurologically.  Skin: Warm and dry, no rash or jaundice.   Psych: Alert and cooperative, normal mood and affect.        Intake/Output from previous day: 02/14 0701 - 02/15 0700 In: 401 [I.V.:401] Out: 350 [Urine:350] Intake/Output this shift:    Lab Results: CBC  Basename 02/06/12 0501 02/05/12 1843  WBC 6.0 8.3  HGB 12.7* 12.0*  HCT 36.6* 34.8*  MCV 95.6 96.4  PLT 286 295   BMET  Basename 02/06/12 0501 02/05/12 1843  NA 136 140  K 3.7 3.8  CL 99 102  CO2 29 30  GLUCOSE 106* 91  BUN 10 11  CREATININE 1.22 1.17  CALCIUM 9.0 9.3   LFT  Basename 02/06/12 0501  BILITOT 0.7  BILIDIR --  IBILI --  ALKPHOS 54  AST 19  ALT 10  PROT 6.9  ALBUMIN 2.9*   Imaging Studies: Ct Abdomen Pelvis W Contrast  02/05/2012  *RADIOLOGY REPORT*  Clinical Data: Abdominal pain, small bowel obstruction.  Previous history of Crohn disease with transverse loop colostomy, colectomy, spigelian hernia, and hernia repair.  CT ABDOMEN AND PELVIS WITH CONTRAST  Technique:  Multidetector CT imaging  of the abdomen and pelvis was performed following the standard protocol during bolus administration of intravenous contrast.  Contrast: 95m OMNIPAQUE IOHEXOL 300 MG/ML IV SOLN, 1085mOMNIPAQUE IOHEXOL 300 MG/ML IV SOLN  Comparison: 12/12/2011  Findings: Atelectasis in the lung bases.  There is mild intrahepatic and moderate extrahepatic bile duct dilatation associated with pancreatic ductal dilatation.  The appearance is similar to the previous study.  No specific obstructing mass or stone are visualized.  Occult obstructing lesion is not excluded.  No focal parenchymal lesions in the liver. Spleen size is normal.  Gallbladder is unremarkable.  No adrenal gland nodules.  Kidneys are symmetrical without mass or hydronephrosis.  Scattered celiac axis, mesenteric, and retroperitoneal lymph nodes are not pathologically enlarged. Enlarged right lower quadrant lymph nodes are present, measuring up to about 13 mm diameter.  These are likely reactive.  There is a left lateral abdominal wall hernia arising between the junction of the flank muscles and to the rectus abdominous muscle. This contains multiple loops of small bowel.  There is a small amount of fluid and infiltration in the hernia.  There is a transition zone present with mild proximal small bowel dilatation and distal small bowel decompression within the hernia.  This is consistent with small bowel obstruction.  There is a right lower quadrant colostomy.  The proximal colon is distended with thickened wall and stool.  The distal colon is decompressed.  Mild fluid in the right pericolic gutter.  No free air in the abdomen.  Pelvis:  Enlarged prostate gland.  No bladder wall thickening.  No loculated pelvic fluid collection to suggest abscess.  No significant pelvic lymphadenopathy.  Degenerative changes in the lumbar spine.  IMPRESSION: Left flank hernia with herniation of small bowel and change in caliber consistent with obstruction.  Right lower quadrant ostomy  with inflammatory changes and stool stasis in the colon.  Reactive inflammatory lymph nodes.  Biliary and pancreatic ductal dilatation of uncertain etiology. This appears stable since previous study.  Original Report Authenticated By: WINeale BurlyM.D.   Dg Abd Acute W/chest  02/05/2012  *RADIOLOGY  REPORT*  Clinical Data: Abdominal pain.  History of Crohn's disease.  ACUTE ABDOMEN SERIES (ABDOMEN 2 VIEW & CHEST 1 VIEW)  Comparison: CT scan 12/12/2011.  Findings: The upright chest x-ray demonstrates no acute pulmonary findings.  Minimal areas of streaky atelectasis but no effusions or infiltrates.  Two views of the abdomen demonstrate scattered slightly dilated air filled small bowel loops with air fluid levels suggestive of a small bowel obstruction.  No free air.  A right lower quadrant colostomy is noted.  IMPRESSION:  1.  No acute cardiopulmonary findings. 2.  Abdominal films suggest a small bowel obstruction.  Original Report Authenticated By: P. Kalman Jewels, M.D.  Minnie.Brome week]   Impression: 56 y/o male with history of complicated Crohn's disease requiring multiple abdominal surgeries over the years. He has ostomy in right abdomen. H/O bilateral Spigelian Hernias, peristomal hernia. Previous repair of incarcerated hernia in past. Doing well until two days ago, developed abdominal/back pain and decreased flatus through his ostomy bag. CT shows left flank hernia with obstructed small bowel. Persistent biliary and pancreatic ductal dilatation as well (chronic finding with previous w/u).   Plan: 1. B12 level, due for f/u now. 2. Agree with surgical consultation. 3. Continue NPO until seen by surgeon.  4. Further recommendations to follow.   I would like to thank Dr. Bonnielee Haff for allowing [place] to take part in the care of this nice patient.    LOS: 1 day   Neil Crouch  02/06/2012, 9:48 AM

## 2012-02-06 NOTE — Progress Notes (Signed)
Spoke with Dr. Geroge Baseman about consult; will see patient today.

## 2012-02-06 NOTE — Consult Note (Signed)
REVIEWED.  SUPPORTIVE CARE. CONTINUE IMURAN.

## 2012-02-06 NOTE — Progress Notes (Signed)
Subjective: Nausea improved, abdominal pain improved, having output from ostomy  Objective: Vital signs in last 24 hours: Temp:  [98.3 F (36.8 C)-98.8 F (37.1 C)] 98.3 F (36.8 C) (02/15 1348) Pulse Rate:  [74-81] 78  (02/15 1348) Resp:  [18-20] 18  (02/15 1348) BP: (94-111)/(59-71) 99/62 mmHg (02/15 1348) SpO2:  [97 %-100 %] 99 % (02/15 1348) Weight:  [65.6 kg (144 lb 10 oz)-67.132 kg (148 lb)] 65.6 kg (144 lb 10 oz) (02/14 2352) Weight change:  Last BM Date: 02/05/12  Intake/Output from previous day: 02/14 0701 - 02/15 0700 In: 401 [I.V.:401] Out: 350 [Urine:350]     Physical Exam: General: Alert, awake, oriented x3, in no acute distress. HEENT: No bruits, no goiter. Heart: Regular rate and rhythm, without murmurs, rubs, gallops. Lungs: Clear to auscultation bilaterally. Abdomen: Soft, nontender, nondistended, positive bowel sounds. Ostomy in place. Extremities: No clubbing cyanosis or edema with positive pedal pulses. Neuro: Grossly intact, nonfocal.    Lab Results: Basic Metabolic Panel:  Basename 02/06/12 0501 02/05/12 1843  NA 136 140  K 3.7 3.8  CL 99 102  CO2 29 30  GLUCOSE 106* 91  BUN 10 11  CREATININE 1.22 1.17  CALCIUM 9.0 9.3  MG -- --  PHOS -- --   Liver Function Tests:  Basename 02/06/12 0501  AST 19  ALT 10  ALKPHOS 54  BILITOT 0.7  PROT 6.9  ALBUMIN 2.9*    Basename 02/05/12 2039  LIPASE 17  AMYLASE --   No results found for this basename: AMMONIA:2 in the last 72 hours CBC:  Basename 02/06/12 0501 02/05/12 1843  WBC 6.0 8.3  NEUTROABS -- 6.2  HGB 12.7* 12.0*  HCT 36.6* 34.8*  MCV 95.6 96.4  PLT 286 295   Cardiac Enzymes: No results found for this basename: CKTOTAL:3,CKMB:3,CKMBINDEX:3,TROPONINI:3 in the last 72 hours BNP: No results found for this basename: PROBNP:3 in the last 72 hours D-Dimer: No results found for this basename: DDIMER:2 in the last 72 hours CBG: No results found for this basename: GLUCAP:6 in  the last 72 hours Hemoglobin A1C: No results found for this basename: HGBA1C in the last 72 hours Fasting Lipid Panel: No results found for this basename: CHOL,HDL,LDLCALC,TRIG,CHOLHDL,LDLDIRECT in the last 72 hours Thyroid Function Tests: No results found for this basename: TSH,T4TOTAL,FREET4,T3FREE,THYROIDAB in the last 72 hours Anemia Panel: No results found for this basename: VITAMINB12,FOLATE,FERRITIN,TIBC,IRON,RETICCTPCT in the last 72 hours Coagulation: No results found for this basename: LABPROT:2,INR:2 in the last 72 hours Urine Drug Screen: Drugs of Abuse     Component Value Date/Time   LABOPIA POSITIVE* 07/09/2010 1400   COCAINSCRNUR NONE DETECTED 07/09/2010 1400   LABBENZ NONE DETECTED 07/09/2010 1400   AMPHETMU NONE DETECTED 07/09/2010 1400   THCU NONE DETECTED 07/09/2010 1400   LABBARB  Value: NONE DETECTED        DRUG SCREEN FOR MEDICAL PURPOSES ONLY.  IF CONFIRMATION IS NEEDED FOR ANY PURPOSE, NOTIFY LAB WITHIN 5 DAYS.        LOWEST DETECTABLE LIMITS FOR URINE DRUG SCREEN Drug Class       Cutoff (ng/mL) Amphetamine      1000 Barbiturate      200 Benzodiazepine   408 Tricyclics       144 Opiates          300 Cocaine          300 THC              50 07/09/2010 1400  Alcohol Level: No results found for this basename: ETH:2 in the last 72 hours Urinalysis: No results found for this basename: COLORURINE:2,APPERANCEUR:2,LABSPEC:2,PHURINE:2,GLUCOSEU:2,HGBUR:2,BILIRUBINUR:2,KETONESUR:2,PROTEINUR:2,UROBILINOGEN:2,NITRITE:2,LEUKOCYTESUR:2 in the last 72 hours  No results found for this or any previous visit (from the past 240 hour(s)).  Studies/Results: Ct Abdomen Pelvis W Contrast  02/05/2012  *RADIOLOGY REPORT*  Clinical Data: Abdominal pain, small bowel obstruction.  Previous history of Crohn disease with transverse loop colostomy, colectomy, spigelian hernia, and hernia repair.  CT ABDOMEN AND PELVIS WITH CONTRAST  Technique:  Multidetector CT imaging of the abdomen and pelvis  was performed following the standard protocol during bolus administration of intravenous contrast.  Contrast: 40m OMNIPAQUE IOHEXOL 300 MG/ML IV SOLN, 1070mOMNIPAQUE IOHEXOL 300 MG/ML IV SOLN  Comparison: 12/12/2011  Findings: Atelectasis in the lung bases.  There is mild intrahepatic and moderate extrahepatic bile duct dilatation associated with pancreatic ductal dilatation.  The appearance is similar to the previous study.  No specific obstructing mass or stone are visualized.  Occult obstructing lesion is not excluded.  No focal parenchymal lesions in the liver. Spleen size is normal.  Gallbladder is unremarkable.  No adrenal gland nodules.  Kidneys are symmetrical without mass or hydronephrosis.  Scattered celiac axis, mesenteric, and retroperitoneal lymph nodes are not pathologically enlarged. Enlarged right lower quadrant lymph nodes are present, measuring up to about 13 mm diameter.  These are likely reactive.  There is a left lateral abdominal wall hernia arising between the junction of the flank muscles and to the rectus abdominous muscle. This contains multiple loops of small bowel.  There is a small amount of fluid and infiltration in the hernia.  There is a transition zone present with mild proximal small bowel dilatation and distal small bowel decompression within the hernia.  This is consistent with small bowel obstruction.  There is a right lower quadrant colostomy.  The proximal colon is distended with thickened wall and stool.  The distal colon is decompressed.  Mild fluid in the right pericolic gutter.  No free air in the abdomen.  Pelvis:  Enlarged prostate gland.  No bladder wall thickening.  No loculated pelvic fluid collection to suggest abscess.  No significant pelvic lymphadenopathy.  Degenerative changes in the lumbar spine.  IMPRESSION: Left flank hernia with herniation of small bowel and change in caliber consistent with obstruction.  Right lower quadrant ostomy with inflammatory changes  and stool stasis in the colon.  Reactive inflammatory lymph nodes.  Biliary and pancreatic ductal dilatation of uncertain etiology. This appears stable since previous study.  Original Report Authenticated By: WINeale BurlyM.D.   Dg Abd Acute W/chest  02/05/2012  *RADIOLOGY REPORT*  Clinical Data: Abdominal pain.  History of Crohn's disease.  ACUTE ABDOMEN SERIES (ABDOMEN 2 VIEW & CHEST 1 VIEW)  Comparison: CT scan 12/12/2011.  Findings: The upright chest x-ray demonstrates no acute pulmonary findings.  Minimal areas of streaky atelectasis but no effusions or infiltrates.  Two views of the abdomen demonstrate scattered slightly dilated air filled small bowel loops with air fluid levels suggestive of a small bowel obstruction.  No free air.  A right lower quadrant colostomy is noted.  IMPRESSION:  1.  No acute cardiopulmonary findings. 2.  Abdominal films suggest a small bowel obstruction.  Original Report Authenticated By: P. MAKalman JewelsM.D.    Medications: Scheduled Meds:   . sodium chloride   Intravenous Once  . enoxaparin  40 mg Subcutaneous Q24H  . sodium chloride       Continuous  Infusions:   . dextrose 5 % and 0.45% NaCl 100 mL/hr at 02/06/12 1020   PRN Meds:.albuterol, HYDROmorphone, iohexol, iohexol, ondansetron (ZOFRAN) IV, ondansetron  Assessment/Plan:  Principal Problem:  *SBO (small bowel obstruction), clinically improved, start on clear liquid diet and advance as tolerated to regular diet. Appreciate surgery input.  Active Problems:  ANEMIA, mild, for outpatient follow up   CROHN'S DISEASE, GI following, patient is on imuran as an outpatient, this can be restarted if he tolerates po  Dispo, patient can be discharged home if he tolerates a solid diet.    LOS: 1 day   [facility] Pager: [phone] 02/06/2012, 2:27 PM

## 2012-02-07 DIAGNOSIS — K56609 Unspecified intestinal obstruction, unspecified as to partial versus complete obstruction: Secondary | ICD-10-CM

## 2012-02-07 DIAGNOSIS — K5090 Crohn's disease, unspecified, without complications: Secondary | ICD-10-CM

## 2012-02-07 NOTE — Progress Notes (Signed)
Subjective: This man is feeling better and is tolerating a liquid diet very easily. His  ostomy has got good output. There is no nausea or vomiting. There is no abdominal pain.           Physical Exam: Blood pressure 98/65, pulse 75, temperature 98.1 F (36.7 C), temperature source Oral, resp. rate 20, height 5' 4"  (1.626 m), weight 65.6 kg (144 lb 10 oz), SpO2 99.00%. He looks systemically well. Lung fields are clear. Abdomen is soft and nontender. He is alert and orientated.   Investigations:     Basic Metabolic Panel:  Basename 02/06/12 0501 02/05/12 1843  NA 136 140  K 3.7 3.8  CL 99 102  CO2 29 30  GLUCOSE 106* 91  BUN 10 11  CREATININE 1.22 1.17  CALCIUM 9.0 9.3  MG -- --  PHOS -- --   Liver Function Tests:  Basename 02/06/12 0501  AST 19  ALT 10  ALKPHOS 54  BILITOT 0.7  PROT 6.9  ALBUMIN 2.9*     CBC:  Basename 02/06/12 0501 02/05/12 1843  WBC 6.0 8.3  NEUTROABS -- 6.2  HGB 12.7* 12.0*  HCT 36.6* 34.8*  MCV 95.6 96.4  PLT 286 295    Ct Abdomen Pelvis W Contrast  02/05/2012  *RADIOLOGY REPORT*  Clinical Data: Abdominal pain, small bowel obstruction.  Previous history of Crohn disease with transverse loop colostomy, colectomy, spigelian hernia, and hernia repair.  CT ABDOMEN AND PELVIS WITH CONTRAST  Technique:  Multidetector CT imaging of the abdomen and pelvis was performed following the standard protocol during bolus administration of intravenous contrast.  Contrast: 79m OMNIPAQUE IOHEXOL 300 MG/ML IV SOLN, 1014mOMNIPAQUE IOHEXOL 300 MG/ML IV SOLN  Comparison: 12/12/2011  Findings: Atelectasis in the lung bases.  There is mild intrahepatic and moderate extrahepatic bile duct dilatation associated with pancreatic ductal dilatation.  The appearance is similar to the previous study.  No specific obstructing mass or stone are visualized.  Occult obstructing lesion is not excluded.  No focal parenchymal lesions in the liver. Spleen size is normal.   Gallbladder is unremarkable.  No adrenal gland nodules.  Kidneys are symmetrical without mass or hydronephrosis.  Scattered celiac axis, mesenteric, and retroperitoneal lymph nodes are not pathologically enlarged. Enlarged right lower quadrant lymph nodes are present, measuring up to about 13 mm diameter.  These are likely reactive.  There is a left lateral abdominal wall hernia arising between the junction of the flank muscles and to the rectus abdominous muscle. This contains multiple loops of small bowel.  There is a small amount of fluid and infiltration in the hernia.  There is a transition zone present with mild proximal small bowel dilatation and distal small bowel decompression within the hernia.  This is consistent with small bowel obstruction.  There is a right lower quadrant colostomy.  The proximal colon is distended with thickened wall and stool.  The distal colon is decompressed.  Mild fluid in the right pericolic gutter.  No free air in the abdomen.  Pelvis:  Enlarged prostate gland.  No bladder wall thickening.  No loculated pelvic fluid collection to suggest abscess.  No significant pelvic lymphadenopathy.  Degenerative changes in the lumbar spine.  IMPRESSION: Left flank hernia with herniation of small bowel and change in caliber consistent with obstruction.  Right lower quadrant ostomy with inflammatory changes and stool stasis in the colon.  Reactive inflammatory lymph nodes.  Biliary and pancreatic ductal dilatation of uncertain etiology. This appears stable since  previous study.  Original Report Authenticated By: Neale Burly, M.D.   Dg Abd Acute W/chest  02/05/2012  *RADIOLOGY REPORT*  Clinical Data: Abdominal pain.  History of Crohn's disease.  ACUTE ABDOMEN SERIES (ABDOMEN 2 VIEW & CHEST 1 VIEW)  Comparison: CT scan 12/12/2011.  Findings: The upright chest x-ray demonstrates no acute pulmonary findings.  Minimal areas of streaky atelectasis but no effusions or infiltrates.  Two  views of the abdomen demonstrate scattered slightly dilated air filled small bowel loops with air fluid levels suggestive of a small bowel obstruction.  No free air.  A right lower quadrant colostomy is noted.  IMPRESSION:  1.  No acute cardiopulmonary findings. 2.  Abdominal films suggest a small bowel obstruction.  Original Report Authenticated By: P. Kalman Jewels, M.D.      Medications: I have reviewed the patient's current medications.  Impression: 1. Small bowel obstruction, clinically improving. 2. Anemia likely of chronic disease. 3. Crohn's disease.     Plan: 1. Advance his diet. If he tolerates, he can be discharged home later on today.     LOS: 2 days   [name] Pager [phone]  02/07/2012, 10:54 AM

## 2012-02-07 NOTE — Progress Notes (Signed)
Pt tolerating full liquid diet well, will advance to regular diet

## 2012-02-07 NOTE — Progress Notes (Signed)
Subjective; Patient states he feels much better. His abdominal pain has resolved. He is passing more gas and fluid via  Ileostomy. He is hungry. He had no difficulty with clear liquids. Objective; BP 98/65  Pulse 75  Temp(Src) 98.1 F (36.7 C) (Oral)  Resp 20  Ht 5' 4"  (1.626 m)  Wt 144 lb 10 oz (65.6 kg)  BMI 24.82 kg/m2  SpO2 99% Patient appears to be comfortable. Abdominal exam reveals extensive scarring and bulge in left flank where he has known hernia. Ileostomy bag has thick liquid stool. Ileal mucosa is protruding out for about 3 inches. It is soft and not tender. Bowel sounds are normal and his abdomen is soft and nontender. Lab data; B12 level 655 pg/ml. Comprehensive chemistry panel normal except glucose of 106 and albumin of 2.9. WBC 6.0, hemoglobin 12.7, hematocrit 36.6, platelet 286K. Abdominal pelvic CT from admission reviewed.  Assessment; Acute symptoms secondary to small bowel obstruction have resolved. 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 I do not believe left flank hernia as a source of his symptoms. He may benefit from ileoscopy. Recommendations; Advance diet. Will review abdominal pelvic CT with radiologist. When asked Dr. Gala Romney to consider ileoscopy on an outpatient basis. He will go back on Imuran when he goes home.

## 2012-02-08 NOTE — Discharge Summary (Signed)
Physician Discharge Summary  Patient ID: Daniel Howard MRN: [id number] DOB/AGE: 03/08/1956 56 y.o. Primary Care Physician:[place], Lonell Grandchild, MD, MD Admit date: 02/05/2012 Discharge date: 02/08/2012    Discharge Diagnoses:  1. Small bowel obstruction, clinically resolved. 2. Crohn's disease, stable. 3. Abdominal wall hernia. 4. Mild anemia of chronic disease.   Medication List  As of 02/08/2012  9:48 AM   TAKE these medications         acetaminophen 500 MG tablet   Commonly known as: TYLENOL   Take 1,000 mg by mouth as needed. For pain      azaTHIOprine 50 MG tablet   Commonly known as: IMURAN   Take 50-75 mg by mouth 2 (two) times daily. Takes 50 mg in the morning and 75 mg in the evening      CALCIUM 1200 PO   Take 1,200 mg by mouth daily.      ferrous gluconate 325 MG tablet   Commonly known as: FERGON   Take 325 mg by mouth daily with breakfast.      multivitamins ther. w/minerals Tabs   Take 1 tablet by mouth daily.      traMADol 50 MG tablet   Commonly known as: ULTRAM   Take 1 tablet (50 mg total) by mouth 2 (two) times daily as needed for pain.            Discharged Condition: Stable and improved.    Consults: Gastroenterology, Dr. Laural Golden  Significant Diagnostic Studies: Ct Abdomen Pelvis W Contrast  02/05/2012  *RADIOLOGY REPORT*  Clinical Data: Abdominal pain, small bowel obstruction.  Previous history of Crohn disease with transverse loop colostomy, colectomy, spigelian hernia, and hernia repair.  CT ABDOMEN AND PELVIS WITH CONTRAST  Technique:  Multidetector CT imaging of the abdomen and pelvis was performed following the standard protocol during bolus administration of intravenous contrast.  Contrast: 48m OMNIPAQUE IOHEXOL 300 MG/ML IV SOLN, 1080mOMNIPAQUE IOHEXOL 300 MG/ML IV SOLN  Comparison: 12/12/2011  Findings: Atelectasis in the lung bases.  There is mild intrahepatic and moderate extrahepatic bile duct dilatation associated with pancreatic  ductal dilatation.  The appearance is similar to the previous study.  No specific obstructing mass or stone are visualized.  Occult obstructing lesion is not excluded.  No focal parenchymal lesions in the liver. Spleen size is normal.  Gallbladder is unremarkable.  No adrenal gland nodules.  Kidneys are symmetrical without mass or hydronephrosis.  Scattered celiac axis, mesenteric, and retroperitoneal lymph nodes are not pathologically enlarged. Enlarged right lower quadrant lymph nodes are present, measuring up to about 13 mm diameter.  These are likely reactive.  There is a left lateral abdominal wall hernia arising between the junction of the flank muscles and to the rectus abdominous muscle. This contains multiple loops of small bowel.  There is a small amount of fluid and infiltration in the hernia.  There is a transition zone present with mild proximal small bowel dilatation and distal small bowel decompression within the hernia.  This is consistent with small bowel obstruction.  There is a right lower quadrant colostomy.  The proximal colon is distended with thickened wall and stool.  The distal colon is decompressed.  Mild fluid in the right pericolic gutter.  No free air in the abdomen.  Pelvis:  Enlarged prostate gland.  No bladder wall thickening.  No loculated pelvic fluid collection to suggest abscess.  No significant pelvic lymphadenopathy.  Degenerative changes in the lumbar spine.  IMPRESSION: Left flank hernia with herniation  of small bowel and change in caliber consistent with obstruction.  Right lower quadrant ostomy with inflammatory changes and stool stasis in the colon.  Reactive inflammatory lymph nodes.  Biliary and pancreatic ductal dilatation of uncertain etiology. This appears stable since previous study.  Original Report Authenticated By: Neale Burly, M.D.   Dg Abd Acute W/chest  02/05/2012  *RADIOLOGY REPORT*  Clinical Data: Abdominal pain.  History of Crohn's disease.  ACUTE  ABDOMEN SERIES (ABDOMEN 2 VIEW & CHEST 1 VIEW)  Comparison: CT scan 12/12/2011.  Findings: The upright chest x-ray demonstrates no acute pulmonary findings.  Minimal areas of streaky atelectasis but no effusions or infiltrates.  Two views of the abdomen demonstrate scattered slightly dilated air filled small bowel loops with air fluid levels suggestive of a small bowel obstruction.  No free air.  A right lower quadrant colostomy is noted.  IMPRESSION:  1.  No acute cardiopulmonary findings. 2.  Abdominal films suggest a small bowel obstruction.  Original Report Authenticated By: P. Kalman Jewels, M.D.    Lab Results: Basic Metabolic Panel:  Basename 02/06/12 0501 02/05/12 1843  NA 136 140  K 3.7 3.8  CL 99 102  CO2 29 30  GLUCOSE 106* 91  BUN 10 11  CREATININE 1.22 1.17  CALCIUM 9.0 9.3  MG -- --  PHOS -- --   Liver Function Tests:  Basename 02/06/12 0501  AST 19  ALT 10  ALKPHOS 54  BILITOT 0.7  PROT 6.9  ALBUMIN 2.9*     CBC:  Basename 02/06/12 0501 02/05/12 1843  WBC 6.0 8.3  NEUTROABS -- 6.2  HGB 12.7* 12.0*  HCT 36.6* 34.8*  MCV 95.6 96.4  PLT 286 295       Hospital Course: This 56 year old man was admitted with symptoms of abdominal pain associated with constipation. The patient has a history of Crohn's disease and is status post multiple abdominal surgeries including ileostomy. He noticed he was unable to pass any gas from his ileostomy site and still, however, had brownish stool which was watery. The abdominal pain was 8/10 in intensity. There was no significant nausea vomiting. When he presented to the emergency room imaging studies confirmed the presence of bowel obstruction, small bowel. He was therefore treated conservatively with intravenous fluids, n.p.o. He was seen by general surgery but did not feel he had any significant bowel obstruction and that a conservative approach would be appropriate. He was also seen by Dr. Oneida Alar and Dr Laural Golden. He was on a clear  fluid diet which he tolerated well and then yesterday was advanced with his diet. He has tolerated this well also. He feels great today and is keen to go home.  Discharge Exam: Blood pressure 96/65, pulse 57, temperature 98 F (36.7 C), temperature source Oral, resp. rate 18, height 5' 4"  (1.626 m), weight 65.6 kg (144 lb 10 oz), SpO2 96.00%. He looks systemically well. Is not toxic or septic. He is not shock despite the soft blood pressure. Abdomen is soft nontender. Ileostomy is functioning. Heart sounds are present and normal. Lung fields are clear.  Disposition: Home. He'll follow with his primary care physician in due course.  Discharge Orders    Future Appointments: Provider: Department: Dept Phone: Center:   05/05/2012 9:30 AM Vic Blackbird, MD [facility] [phone] [facility]     Future Orders Please Complete By Expires   Diet - low sodium heart healthy      Increase activity slowly  Follow-up Information    Follow up with Vic Blackbird, MD. Schedule an appointment as soon as possible for a visit in 1 week.         SignedDoree Albee Pager [phone]  02/08/2012, 9:48 AM

## 2012-02-08 NOTE — Progress Notes (Signed)
Pt discharged home.  Pt in stable condition, no complaints at this time.  Pt instructed to call PCP office tomorrow morning and schedule a follow up appointment.  Pt verbalizes understanding of discharge instructions.

## 2012-02-09 ENCOUNTER — Telehealth: Payer: Self-pay | Admitting: Gastroenterology

## 2012-02-09 NOTE — Telephone Encounter (Signed)
LMOM for patient to call me back to set up an OV in 4-6 weeks with RMR

## 2012-02-09 NOTE — Telephone Encounter (Signed)
Needs OV in 4-6 weeks. S/p hospitalization. ?needs ileocolonoscopy via rectum and ostomy.

## 2012-02-10 NOTE — Telephone Encounter (Signed)
Pt is aware of OV on 3/26 at 1130 with RMR

## 2012-02-16 ENCOUNTER — Ambulatory Visit (INDEPENDENT_AMBULATORY_CARE_PROVIDER_SITE_OTHER): Payer: Medicare Other | Admitting: Family Medicine

## 2012-02-16 ENCOUNTER — Encounter: Payer: Self-pay | Admitting: Family Medicine

## 2012-02-16 ENCOUNTER — Other Ambulatory Visit: Payer: Self-pay | Admitting: Family Medicine

## 2012-02-16 VITALS — BP 118/72 | HR 76 | Resp 18 | Ht 66.0 in | Wt 144.1 lb

## 2012-02-16 DIAGNOSIS — R0789 Other chest pain: Secondary | ICD-10-CM

## 2012-02-16 DIAGNOSIS — M545 Low back pain, unspecified: Secondary | ICD-10-CM

## 2012-02-16 DIAGNOSIS — Z79899 Other long term (current) drug therapy: Secondary | ICD-10-CM | POA: Diagnosis not present

## 2012-02-16 DIAGNOSIS — K56609 Unspecified intestinal obstruction, unspecified as to partial versus complete obstruction: Secondary | ICD-10-CM

## 2012-02-16 DIAGNOSIS — R071 Chest pain on breathing: Secondary | ICD-10-CM | POA: Diagnosis not present

## 2012-02-16 DIAGNOSIS — Z125 Encounter for screening for malignant neoplasm of prostate: Secondary | ICD-10-CM | POA: Diagnosis not present

## 2012-02-16 MED ORDER — CYCLOBENZAPRINE HCL 5 MG PO TABS: 5.0000 mg | ORAL_TABLET | Freq: Two times a day (BID) | ORAL | Status: DC | PRN

## 2012-02-16 NOTE — Assessment & Plan Note (Signed)
Pt pain is more side chest wall pain or musculoskeletal, however with recent hospital hospitalization will obtain D dimer, as low probability for PE with no noted hypoxia

## 2012-02-16 NOTE — Patient Instructions (Signed)
Please get the labs done We will call with results If the blood test is abnormal you will need to have a special CT scan Take the muscle relaxant as needed Continue the ultram(pain medication) F/U with Dr. Gala Romney Keep previous appointment

## 2012-02-16 NOTE — Assessment & Plan Note (Signed)
Continue ultram, I beleive the pain in his side wall is likely spasm related, will start low dose muscle relaxant

## 2012-02-16 NOTE — Progress Notes (Signed)
  Subjective:    Patient ID: Daniel Howard, male    DOB: 1956-08-06, 56 y.o.   MRN: [id number]  HPI Patient presents for followup hospital admission. He was admitted for 3 days for small bowel obstruction. No surgical intervention was needed. Today he complains of right sided back pain. He states this feels different from his previous back pain. He was started on Ultram in her first visit which has helped. He denies any dysuria, hematuria. He denies abdominal pain. He states when he takes a deep breath or moves a certain way he has a sharp pain in his right side. He denies shortness of breath has an occasional cough which is nonproductive. Denies leg swelling, calf pain, chest pain.   Review of Systems  GEN- denies fatigue, fever, weight loss,weakness, recent illness HEENT- denies eye drainage, change in vision, nasal discharge, CVS- denies chest pain, palpitations, no leg edema RESP- denies SOB, cough, wheeze ABD- denies N/V, abd pain GU- denies dysuria, hematuria, dribbling, incontinence MSK- + joint pain, muscle aches, injury Neuro- denies headache, dizziness, syncope, seizure activity      Objective:   Physical Exam GEN- NAD, alert and oriented x3 CVS- RRR, no murmur RESP-CTAB Chest wall- mild TTP Right side chest wall, near bottom of ribs ABD- ostomy RLQ, NABS, no CVA tenderness EXT- No edema, neg homans Pulses- Radial, DP- 2+ MSK- mild TTP lumbar region, spine non tender,+ right  paraspinal tenderness, neg SLR  lower ext strength equal bilat, mild spasm, pain with lateral motions, no pain with flexion/extension        Assessment & Plan:

## 2012-02-16 NOTE — Assessment & Plan Note (Signed)
Resolved, tolerating PO , no change in output from ostomy

## 2012-02-17 LAB — LIPID PANEL
HDL: 43 mg/dL (ref >39)
Total CHOL/HDL Ratio: 2.8 Ratio
Triglycerides: 70 mg/dL (ref <150)

## 2012-02-17 LAB — PSA, MEDICARE: PSA: 1.31 ng/mL (ref <=4.00)

## 2012-02-25 ENCOUNTER — Other Ambulatory Visit: Payer: Self-pay | Admitting: Urgent Care

## 2012-02-25 DIAGNOSIS — K5090 Crohn's disease, unspecified, without complications: Secondary | ICD-10-CM | POA: Diagnosis not present

## 2012-02-26 LAB — CBC WITH DIFFERENTIAL/PLATELET
Basophils Absolute: 0.1 10*3/uL (ref 0.0–0.1)
HCT: 37.4 % — ABNORMAL LOW (ref 39.0–52.0)
Lymphocytes Relative: 26 % (ref 12–46)
Lymphs Abs: 1.4 10*3/uL (ref 0.7–4.0)
MCV: 97.1 fL (ref 78.0–100.0)
Monocytes Absolute: 0.4 10*3/uL (ref 0.1–1.0)
Neutro Abs: 3.4 10*3/uL (ref 1.7–7.7)
Platelets: 334 10*3/uL (ref 150–400)
RBC: 3.85 MIL/uL — ABNORMAL LOW (ref 4.22–5.81)
RDW: 14.4 % (ref 11.5–15.5)
WBC: 5.4 10*3/uL (ref 4.0–10.5)

## 2012-02-26 LAB — HEPATIC FUNCTION PANEL
Alkaline Phosphatase: 49 U/L (ref 39–117)
Bilirubin, Direct: 0.2 mg/dL (ref 0.0–0.3)
Indirect Bilirubin: 0.5 mg/dL (ref 0.0–0.9)

## 2012-02-26 NOTE — Progress Notes (Signed)
Quick Note:  Please call & left patient knows his lab work is stable. His hemoglobin is slightly below normal. Keep followup appointment with Dr. Gala Romney as planned ______

## 2012-03-16 ENCOUNTER — Other Ambulatory Visit: Payer: Self-pay

## 2012-03-16 ENCOUNTER — Ambulatory Visit (INDEPENDENT_AMBULATORY_CARE_PROVIDER_SITE_OTHER): Payer: Medicare Other | Admitting: Internal Medicine

## 2012-03-16 ENCOUNTER — Encounter: Payer: Self-pay | Admitting: Internal Medicine

## 2012-03-16 VITALS — BP 95/64 | HR 85 | Temp 97.6°F | Ht 64.0 in | Wt 146.6 lb

## 2012-03-16 DIAGNOSIS — K5090 Crohn's disease, unspecified, without complications: Secondary | ICD-10-CM

## 2012-03-16 NOTE — Patient Instructions (Signed)
Continue present regimen  Cbc in 4 mos  Office visit in 6 mos

## 2012-03-16 NOTE — Progress Notes (Signed)
Primary Care Physician:  Vic Blackbird, MD, MD Primary Gastroenterologist:  Dr. Gala Romney  Pre-Procedure History & Physical: HPI:  Daniel Howard is a 56 y.o. male here for followup of complicated Crohn's disease. Hospitalized last month briefly what what appeared to be a partial small bowel obstruction. He saw Dr. Geroge Baseman. Illness past with conservative measures. Patient states she is back to baseline. Ostomy output also back to baseline. He is continues to take azathioprine. Vitamin B12 level DCLV one month ago. Hemoglobin 12 and half. He has not been leukopenic. No fever or chills. Bone densitometry 2 years ago for a revealed normal bone density. He is on vitamin D and calcium. He also takes iron supplementation. Past Medical History  Diagnosis Date  . Anxiety   . Low back pain   . 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 Abnormal finding of biliary tract     MRCP shows pancreatic/biliary tract dilation. EUS 2010 confirmed dilation but no chronic pancreaitis or mass. Vascular ectasia crimpoing distal CBD.   [name] Spigelian hernia     bilateral  . Duodenal ulcer 2010    nsaids  . Peristomal hernia   . Small bowel obstruction     Past Surgical History  Procedure Date  . Colostomy   . Colectomy   . Hernia repair     incarcerated periostial hernia  . Esophagogastroduodenoscopy 05/2009    multiple antral erosions, large ulcer at ansatomosis (postsurgical changes at duodenal bulb and second portion of duodenum) BX c/x NSAIDS.    Prior to Admission medications   Medication Sig Start Date End Date Taking? Authorizing Provider  acetaminophen (TYLENOL) 500 MG tablet Take 1,000 mg by mouth as needed. For pain    Yes Historical Provider, MD  azaTHIOprine (IMURAN) 50 MG tablet Take 50-75 mg by mouth 2 (two) times daily. Takes 50 mg in the  morning and 75 mg in the evening 08/29/11  Yes Mahala Menghini, PA  Calcium Carbonate-Vit D-Min (CALCIUM 1200 PO) Take 1,200 mg by mouth daily.    Yes Historical Provider, MD  cyclobenzaprine (FLEXERIL) 5 MG tablet Take 1 tablet (5 mg total) by mouth 2 (two) times daily as needed for muscle spasms. 02/16/12 02/15/13 Yes Alycia Rossetti, MD  ferrous gluconate (FERGON) 325 MG tablet Take 325 mg by mouth daily with breakfast.     Yes Historical Provider, MD  Multiple Vitamins-Minerals (MULTIVITAMINS THER. W/MINERALS) TABS Take 1 tablet by mouth daily.     Yes Historical Provider, MD  traMADol (ULTRAM) 50 MG tablet Take 1 tablet (50 mg total) by mouth 2 (two) times daily as needed for pain. 02/03/12 02/02/13 Yes Alycia Rossetti, MD    Allergies as of 03/16/2012 - Review Complete 03/16/2012  Allergen Reaction Noted  . Penicillins Hives 01/25/2007    Family History  Problem Relation Age of Onset  . Cancer Father     prostate   . Prostate cancer Father   . Hypertension Sister   . Cancer Sister   . Depression Sister   . Breast cancer Sister   . COPD Sister   . Aneurysm Brother     deceased, brain aneurysm    History   Social History  . Marital Status: Married    Spouse Name: [name]    Number of Children: 49  . Years of Education: [name]   Occupational  History  . disabled    Social History Main Topics  . Smoking status: Former Smoker -- 0.5 packs/day for 2 years    Types: Cigarettes    Quit date: 12/21/2009  . Smokeless tobacco: Not on file  . Alcohol Use: No     Former drinker  . Drug Use: No  . Sexually Active: Yes -- Male partner(s)   Other Topics Concern  . Not on file   Social History Narrative  . No narrative on file    Review of Systems: See HPI, otherwise negative ROS  Physical Exam: BP 95/64  Pulse 85  Temp(Src) 97.6 F (36.4 C) (Temporal)  Ht 5' 4"  (1.626 m)  Wt 146 lb 9.6 oz (66.497 kg)  BMI 25.16 kg/m2 General:   Alert,  Well-developed, well-nourished,  pleasant and cooperative in NAD Skin:  Intact without significant lesions or rashes. Eyes:  Sclera clear, no icterus.   Conjunctiva pink. Ears:  Normal auditory acuity. Nose:  No deformity, discharge,  or lesions. Mouth:  No deformity or lesions. Neck:  Supple; no masses or thyromegaly. No significant cervical adenopathy. Lungs:  Clear throughout to auscultation.   No wheezes, crackles, or rhonchi. No acute distress. Heart:  Regular rate and rhythm; no murmurs, clicks, rubs,  or gallops. Abdomen: Colostomy right lower quadrant looks normal. Has extensive scarring with keloid formation. He is obvious left flank abdominal hernia. The abdomen is soft and nontender no obvious mass organomegaly.  Pulses:  Normal pulses noted. Extremities:  Without clubbing or edema.  Impression/Plan:   Pleasant 56 year old gentleman with complicated Crohn's disease status post multiple abdominal surgeries as outlined above presented with a recent partial small bowel traction. He appears to have likely spigelian hernia based on CT scan. Surgical repair would be a complicated endeavor. Fortunately, that is not needed at this time.  Recommendations: Continue his current dose of azathioprine. No B12 supplementation this time. Continue iron supplementation.  A repeat CBC in 4 months. Office visit here in 6 months. Would contemplate another bone density study in approximately  2 Years.

## 2012-03-26 ENCOUNTER — Other Ambulatory Visit: Payer: Self-pay | Admitting: Gastroenterology

## 2012-04-26 ENCOUNTER — Other Ambulatory Visit: Payer: Self-pay | Admitting: Family Medicine

## 2012-05-05 ENCOUNTER — Ambulatory Visit (INDEPENDENT_AMBULATORY_CARE_PROVIDER_SITE_OTHER): Payer: Medicare Other | Admitting: Family Medicine

## 2012-05-05 ENCOUNTER — Encounter: Payer: Self-pay | Admitting: Family Medicine

## 2012-05-05 VITALS — BP 106/70 | HR 78 | Resp 16 | Ht 66.0 in | Wt 149.0 lb

## 2012-05-05 DIAGNOSIS — M545 Low back pain, unspecified: Secondary | ICD-10-CM | POA: Diagnosis not present

## 2012-05-05 DIAGNOSIS — D649 Anemia, unspecified: Secondary | ICD-10-CM | POA: Diagnosis not present

## 2012-05-05 DIAGNOSIS — K5090 Crohn's disease, unspecified, without complications: Secondary | ICD-10-CM

## 2012-05-05 NOTE — Patient Instructions (Addendum)
Continue your current medications  Continue to watch your intake- see handout for chron's disease Your hemoglobin levels look good F/U 6 months

## 2012-05-05 NOTE — Progress Notes (Signed)
  Subjective:    Patient ID: Daniel Howard, male    DOB: 1956/08/01, 56 y.o.   MRN: [id number]  HPI  Patient here for routine visit. He does not have any concerns. He did have diarrhea for a few days noted in his ostomy bag however this is now resolved. He would like some information on feeds PE for Crohn's disease. He was seen by gastroenterology and note was reviewed. Labs were reviewed from March 2013  No problems with back at this time Review of Systems   GEN- denies fatigue, fever, weight loss,weakness, recent illness HEENT- denies eye drainage, change in vision, nasal discharge, CVS- denies chest pain, palpitations RESP- denies SOB, cough, wheeze ABD- denies N/V, change in stools, abd pain GU- denies dysuria, hematuria, dribbling, incontinence MSK- denies joint pain, muscle aches, injury Neuro- denies headache, dizziness, syncope, seizure activity      Objective:   Physical Exam GEN- NAD, alert and oriented x3 HEENT-PERRL, EOMI, MMM, oropharynx clear  CVS- RRR, no murmur RESP-CTAB ABD- ostomy RLQ, NABS, NT, ND EXT- No edema, neg homans Pulses- Radial, DP- 2+         Assessment & Plan:

## 2012-05-05 NOTE — Assessment & Plan Note (Signed)
No current problems continue flexeril and ultram as needed

## 2012-05-05 NOTE — Assessment & Plan Note (Signed)
Doing well on medications, followed by GI, given handout on foods for inflammatory bowel disease

## 2012-05-05 NOTE — Assessment & Plan Note (Signed)
Hb has been stable, continue iron supplement, he will have repeat labs done in the next 3 months

## 2012-05-13 DIAGNOSIS — H5210 Myopia, unspecified eye: Secondary | ICD-10-CM | POA: Diagnosis not present

## 2012-05-13 DIAGNOSIS — H2510 Age-related nuclear cataract, unspecified eye: Secondary | ICD-10-CM | POA: Diagnosis not present

## 2012-05-13 DIAGNOSIS — H18419 Arcus senilis, unspecified eye: Secondary | ICD-10-CM | POA: Diagnosis not present

## 2012-05-13 DIAGNOSIS — H524 Presbyopia: Secondary | ICD-10-CM | POA: Diagnosis not present

## 2012-05-28 ENCOUNTER — Telehealth: Payer: Self-pay

## 2012-05-28 NOTE — Telephone Encounter (Signed)
Pt is on recall list to have CBC in July. I have mailed them a letter for an office  Appointment.

## 2012-05-28 NOTE — Telephone Encounter (Signed)
Lab order on file for July

## 2012-05-28 NOTE — Telephone Encounter (Signed)
Pt is on July recall to have repeat CBC done

## 2012-05-31 ENCOUNTER — Other Ambulatory Visit: Payer: Self-pay

## 2012-05-31 DIAGNOSIS — K5090 Crohn's disease, unspecified, without complications: Secondary | ICD-10-CM

## 2012-07-02 ENCOUNTER — Encounter: Payer: Self-pay | Admitting: Internal Medicine

## 2012-07-02 ENCOUNTER — Ambulatory Visit (INDEPENDENT_AMBULATORY_CARE_PROVIDER_SITE_OTHER): Payer: Medicare Other | Admitting: Internal Medicine

## 2012-07-02 VITALS — BP 95/60 | HR 70 | Temp 97.2°F | Ht 64.0 in | Wt 145.2 lb

## 2012-07-02 DIAGNOSIS — K5090 Crohn's disease, unspecified, without complications: Secondary | ICD-10-CM | POA: Diagnosis not present

## 2012-07-02 NOTE — Patient Instructions (Addendum)
Office visit with [place] in 6 months  CBC as planned

## 2012-07-02 NOTE — Progress Notes (Signed)
Primary Care Physician:  Vic Blackbird, MD Primary Gastroenterologist:  Dr. Gala Romney  Pre-Procedure History & Physical: HPI:  Daniel Howard is a 56 y.o. male here for Crohn's disease. He's done very well since his last office visit. No nausea vomiting or unusual abdominal pain. Ileostomy functioning well. He is due for CBC. Weight is down 1 pound since his last visit. Overall he feels that he is doing fine at this time.  Past Medical History  Diagnosis Date  . Anxiety   . Low back pain   . 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 Abnormal finding of biliary tract     MRCP shows pancreatic/biliary tract dilation. EUS 2010 confirmed dilation but no chronic pancreaitis or mass. Vascular ectasia crimpoing distal CBD.   [name] Spigelian hernia     bilateral  . Duodenal ulcer 2010    nsaids  . Peristomal hernia   . Small bowel obstruction     Past Surgical History  Procedure Date  . Colostomy   . Colectomy   . Hernia repair     incarcerated periostial hernia  . Esophagogastroduodenoscopy 05/2009    multiple antral erosions, large ulcer at ansatomosis (postsurgical changes at duodenal bulb and second portion of duodenum) BX c/x NSAIDS.    Prior to Admission medications   Medication Sig Start Date End Date Taking? Authorizing Provider  acetaminophen (TYLENOL) 500 MG tablet Take 1,000 mg by mouth as needed. For pain    Yes Historical Provider, MD  Calcium Carbonate-Vit D-Min (CALCIUM 1200 PO) Take 1,200 mg by mouth daily.    Yes Historical Provider, MD  cyclobenzaprine (FLEXERIL) 5 MG tablet Take 1 tablet (5 mg total) by mouth 2 (two) times daily as needed for muscle spasms. 02/16/12 02/15/13 Yes Alycia Rossetti, MD  ferrous gluconate (FERGON) 325 MG tablet Take 325 mg by mouth daily with breakfast.     Yes Historical Provider, MD    IMURAN 50 MG tablet TAKE 1 TABLET IN THE MORNING AND 1 & 1/2 TABLET IN THE EVENING. 03/26/12  Yes Mahala Menghini, PA  Multiple Vitamins-Minerals (MULTIVITAMINS THER. W/MINERALS) TABS Take 1 tablet by mouth daily.     Yes Historical Provider, MD  ULTRAM 50 MG tablet TAKE 1 TABLET BY MOUTH TWICE DAILY AS NEEDED FOR PAIN. 04/26/12  Yes Alycia Rossetti, MD    Allergies as of 07/02/2012 - Review Complete 07/02/2012  Allergen Reaction Noted  . Penicillins Hives 01/25/2007    Family History  Problem Relation Age of Onset  . Cancer Father     prostate   . Prostate cancer Father   . Hypertension Sister   . Cancer Sister   . Depression Sister   . Breast cancer Sister   . COPD Sister   . Aneurysm Brother     deceased, brain aneurysm    History   Social History  . Marital Status: Married    Spouse Name: [name]    Number of Children: 86  . Years of Education: [name]   Occupational History  . disabled    Social History Main Topics  . Smoking status: Former Smoker -- 0.5 packs/day for 2 years    Types: Cigarettes    Quit date: 12/21/2009  . Smokeless tobacco: Not on file  . Alcohol Use: No     Former drinker  . Drug Use: No  .  Sexually Active: Yes -- Male partner(s)   Other Topics Concern  . Not on file   Social History Narrative  . No narrative on file    Review of Systems: See HPI, otherwise negative ROS  Physical Exam: BP 95/60  Pulse 70  Temp 97.2 F (36.2 C) (Temporal)  Ht 5' 4"  (1.626 m)  Wt 145 lb 3.2 oz (65.862 kg)  BMI 24.92 kg/m2 General:   Alert,  Well-developed, well-nourished, pleasant and cooperative in NAD Skin:  Intact without significant lesions or rashes. Eyes:  Sclera clear, no icterus.   Conjunctiva pink. Ears:  Normal auditory acuity. Nose:  No deformity, discharge,  or lesions. Mouth:  No deformity or lesions. Neck:  Supple; no masses or thyromegaly. No significant cervical adenopathy. Lungs:  Clear throughout to auscultation.   No wheezes,  crackles, or rhonchi. No acute distress. Heart:  Regular rate and rhythm; no murmurs, clicks, rubs,  or gallops. Abdomen: Marked keloid formation from prior surgery. Ileostomy right lower quadrant with slight prolapse of ileum easily reducible. Tissues look healthy. Abdomen is soft and nontender. Flank hernia unchanged Pulses:  Normal pulses noted. Extremities:  Without clubbing or edema.  Impression/Plan:   All in all, Daniel Howard is doing very well. He is getting along with his Crohn's disease. He's had a long and complicated course.  He is to continue Imuran at the current dose. We'll follow up on the CBC unless something comes up, we'll plan to see this nice gentleman back in 6 months.

## 2012-07-07 NOTE — Progress Notes (Signed)
Reminder in epic to follow up in 6 months with extender

## 2012-07-12 DIAGNOSIS — K5090 Crohn's disease, unspecified, without complications: Secondary | ICD-10-CM | POA: Diagnosis not present

## 2012-07-12 LAB — CBC WITH DIFFERENTIAL/PLATELET
Basophils Relative: 1 % (ref 0–1)
Eosinophils Absolute: 0.2 10*3/uL (ref 0.0–0.7)
Eosinophils Relative: 4 % (ref 0–5)
Hemoglobin: 12.5 g/dL — ABNORMAL LOW (ref 13.0–17.0)
Lymphs Abs: 1 10*3/uL (ref 0.7–4.0)
MCH: 32.1 pg (ref 26.0–34.0)
MCHC: 33.1 g/dL (ref 30.0–36.0)
MCV: 97.2 fL (ref 78.0–100.0)
Monocytes Absolute: 0.3 10*3/uL (ref 0.1–1.0)
Monocytes Relative: 7 % (ref 3–12)
RBC: 3.89 MIL/uL — ABNORMAL LOW (ref 4.22–5.81)

## 2012-07-22 ENCOUNTER — Other Ambulatory Visit: Payer: Self-pay

## 2012-07-22 DIAGNOSIS — K5090 Crohn's disease, unspecified, without complications: Secondary | ICD-10-CM

## 2012-08-04 ENCOUNTER — Other Ambulatory Visit: Payer: Self-pay | Admitting: Gastroenterology

## 2012-08-26 DIAGNOSIS — I8310 Varicose veins of unspecified lower extremity with inflammation: Secondary | ICD-10-CM | POA: Diagnosis not present

## 2012-10-05 ENCOUNTER — Other Ambulatory Visit: Payer: Self-pay

## 2012-10-05 MED ORDER — AZATHIOPRINE 50 MG PO TABS: 50.0000 mg | ORAL_TABLET | Freq: Every day | ORAL | Status: DC

## 2012-10-05 NOTE — Telephone Encounter (Deleted)
Pt due for FU OV.  Please schedule.

## 2012-10-13 DIAGNOSIS — I8310 Varicose veins of unspecified lower extremity with inflammation: Secondary | ICD-10-CM | POA: Diagnosis not present

## 2012-11-01 ENCOUNTER — Encounter: Payer: Self-pay | Admitting: Family Medicine

## 2012-11-01 ENCOUNTER — Ambulatory Visit (INDEPENDENT_AMBULATORY_CARE_PROVIDER_SITE_OTHER): Payer: Medicare Other | Admitting: Family Medicine

## 2012-11-01 VITALS — BP 100/74 | HR 73 | Resp 15 | Wt 143.8 lb

## 2012-11-01 DIAGNOSIS — D649 Anemia, unspecified: Secondary | ICD-10-CM | POA: Diagnosis not present

## 2012-11-01 DIAGNOSIS — M545 Low back pain, unspecified: Secondary | ICD-10-CM | POA: Diagnosis not present

## 2012-11-01 DIAGNOSIS — K5090 Crohn's disease, unspecified, without complications: Secondary | ICD-10-CM

## 2012-11-01 LAB — CBC WITH DIFFERENTIAL/PLATELET
Basophils Absolute: 0.1 10*3/uL (ref 0.0–0.1)
Basophils Relative: 1 % (ref 0–1)
Eosinophils Absolute: 0.2 10*3/uL (ref 0.0–0.7)
Eosinophils Relative: 4 % (ref 0–5)
HCT: 36.4 % — ABNORMAL LOW (ref 39.0–52.0)
Lymphocytes Relative: 18 % (ref 12–46)
MCH: 32.6 pg (ref 26.0–34.0)
MCHC: 34.3 g/dL (ref 30.0–36.0)
MCV: 95 fL (ref 78.0–100.0)
Monocytes Absolute: 0.4 10*3/uL (ref 0.1–1.0)
RDW: 13.8 % (ref 11.5–15.5)

## 2012-11-01 LAB — COMPREHENSIVE METABOLIC PANEL
AST: 16 U/L (ref 0–37)
BUN: 11 mg/dL (ref 6–23)
Calcium: 9 mg/dL (ref 8.4–10.5)
Chloride: 103 mEq/L (ref 96–112)
Creat: 1.19 mg/dL (ref 0.50–1.35)
Total Bilirubin: 0.7 mg/dL (ref 0.3–1.2)

## 2012-11-01 LAB — VITAMIN B12: Vitamin B-12: 687 pg/mL (ref 211–911)

## 2012-11-01 MED ORDER — TRAMADOL HCL 50 MG PO TABS: ORAL_TABLET | ORAL | Status: DC

## 2012-11-01 NOTE — Assessment & Plan Note (Addendum)
Currently doing well without any pain. He's been followed by gastroenterology as well. He will continue current medications. I will obtain a B12 level secondary to his Crohn's disease and medications. Metabolic panel and CBC will also be done with his Imuran His weight is holding steady for the most part, down 1.5lbs since last visit with GI in the July

## 2012-11-01 NOTE — Patient Instructions (Signed)
Continue current medications Pain medication refilled Get the labs done today we will call with results of labs  F/U 6 months

## 2012-11-01 NOTE — Assessment & Plan Note (Signed)
Doing well, refilled ultram

## 2012-11-01 NOTE — Assessment & Plan Note (Signed)
Check CBC, iron stores, continue supplement

## 2012-11-01 NOTE — Progress Notes (Signed)
  Subjective:    Patient ID: Daniel Howard, male    DOB: [birth year], 56 y.o.   MRN: [id number]  HPI   Patient here to follow chronic medical problems. He has no specific concerns today. He states that he had a bout with his abdominal pain and obstruction a couple months ago however he stayed at home. He's been taking his medications as prescribed. He ran out of tramadol and thought he needed an appointment in order to get this refilled. He is due to have CBC B12 and iron levels checked. He was seen by gastroenterology a couple months ago no changes were needed. He declines flu shot   Review of Systems  GEN- denies fatigue, fever, weight loss,weakness, recent illness HEENT- denies eye drainage, change in vision, nasal discharge, CVS- denies chest pain, palpitations RESP- denies SOB, cough, wheeze ABD- denies N/V, change in stools, abd pain GU- denies dysuria, hematuria, dribbling, incontinence MSK- denies joint pain, muscle aches, injury Neuro- denies headache, dizziness, syncope, seizure activity      Objective:   Physical Exam GEN- NAD, alert and oriented x3 HEENT- PERRL, EOMI, non injected sclera, pink conjunctiva, MMM, oropharynx clear CVS- RRR, no murmur RESP-CTAB ABS-NABS,soft,NT,ND, Ostomy bag in place EXT- No edema Pulses- Radial 2+        Assessment & Plan:

## 2012-11-02 ENCOUNTER — Other Ambulatory Visit: Payer: Self-pay

## 2012-11-02 DIAGNOSIS — K5090 Crohn's disease, unspecified, without complications: Secondary | ICD-10-CM

## 2012-11-09 DIAGNOSIS — I8310 Varicose veins of unspecified lower extremity with inflammation: Secondary | ICD-10-CM | POA: Diagnosis not present

## 2012-11-09 DIAGNOSIS — M79609 Pain in unspecified limb: Secondary | ICD-10-CM | POA: Diagnosis not present

## 2012-11-24 DIAGNOSIS — I8310 Varicose veins of unspecified lower extremity with inflammation: Secondary | ICD-10-CM | POA: Diagnosis not present

## 2012-11-24 DIAGNOSIS — M79609 Pain in unspecified limb: Secondary | ICD-10-CM | POA: Diagnosis not present

## 2012-12-03 ENCOUNTER — Encounter: Payer: Self-pay | Admitting: *Deleted

## 2012-12-08 DIAGNOSIS — I8310 Varicose veins of unspecified lower extremity with inflammation: Secondary | ICD-10-CM | POA: Diagnosis not present

## 2012-12-08 DIAGNOSIS — M79609 Pain in unspecified limb: Secondary | ICD-10-CM | POA: Diagnosis not present

## 2012-12-09 DIAGNOSIS — Z880 Allergy status to penicillin: Secondary | ICD-10-CM | POA: Diagnosis not present

## 2012-12-09 DIAGNOSIS — Z79899 Other long term (current) drug therapy: Secondary | ICD-10-CM | POA: Diagnosis not present

## 2012-12-09 DIAGNOSIS — J020 Streptococcal pharyngitis: Secondary | ICD-10-CM | POA: Diagnosis not present

## 2012-12-29 DIAGNOSIS — K5090 Crohn's disease, unspecified, without complications: Secondary | ICD-10-CM | POA: Diagnosis not present

## 2012-12-30 LAB — CBC WITH DIFFERENTIAL/PLATELET
Lymphocytes Relative: 25 % (ref 12–46)
Lymphs Abs: 1.4 10*3/uL (ref 0.7–4.0)
MCV: 97.4 fL (ref 78.0–100.0)
Neutrophils Relative %: 62 % (ref 43–77)
Platelets: 338 10*3/uL (ref 150–400)
RBC: 3.85 MIL/uL — ABNORMAL LOW (ref 4.22–5.81)
WBC: 5.7 10*3/uL (ref 4.0–10.5)

## 2012-12-31 ENCOUNTER — Encounter: Payer: Self-pay | Admitting: Gastroenterology

## 2013-01-03 ENCOUNTER — Encounter: Payer: Self-pay | Admitting: Gastroenterology

## 2013-01-03 ENCOUNTER — Ambulatory Visit (INDEPENDENT_AMBULATORY_CARE_PROVIDER_SITE_OTHER): Payer: Medicare Other | Admitting: Gastroenterology

## 2013-01-03 VITALS — BP 91/57 | HR 86 | Temp 97.4°F | Ht 64.5 in | Wt 143.0 lb

## 2013-01-03 DIAGNOSIS — K5090 Crohn's disease, unspecified, without complications: Secondary | ICD-10-CM

## 2013-01-03 NOTE — Patient Instructions (Addendum)
Continue taking Imuran as you are doing.  Seek medical help if you have belly pain that does not go away and is accompanied by nausea, vomiting, tight belly.   We will see you in 6 months or sooner if necessary.

## 2013-01-03 NOTE — Progress Notes (Signed)
Referring Provider: Alycia Rossetti, MD Primary Care Physician:  Vic Blackbird, MD Primary GI: Dr. Gala Romney    Chief Complaint  Patient presents with  . Follow-up    HPI:   Pleasant 57 year old male with hx of complicated Crohn's disease requiring multiple abdominal procedures, presenting in routine follow-up. Last seen July 2013 by Dr. Gala Romney. Maintained on Imuran. Recent CBC as below. His Hgb is basically staying around the 12 range. On Imuran.  States has a "blockage" for awhile sometimes, points to suprapubic area, RLQ sometimes lasts about an hour, then relieved. No abdominal distension. Last episode 2 months ago. Notes a small bulge at that time. No N/V. No gross blood. Good appetite. Notes he has been doing heavy lifting, straining.     Lab Results  Component Value Date   WBC 5.7 11/02/2012   HGB 12.6* 11/02/2012   HCT 37.5* 11/02/2012   MCV 97.4 11/02/2012   PLT 338 11/02/2012     Past Medical History  Diagnosis Date  . Anxiety   . Low back pain   . 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 Abnormal finding of biliary tract     MRCP shows pancreatic/biliary tract dilation. EUS 2010 confirmed dilation but no chronic pancreaitis or mass. Vascular ectasia crimpoing distal CBD.   [name] Spigelian hernia     bilateral  . Duodenal ulcer 2010    nsaids  . Peristomal hernia   . Small bowel obstruction     Past Surgical History  Procedure Date  . Colostomy   . Colectomy   . Hernia repair     incarcerated periostial hernia  . Esophagogastroduodenoscopy 05/2009    SLF: multiple antral erosions, large ulcer at ansatomosis (postsurgical changes at duodenal bulb and second portion of duodenum) BX c/x NSAIDS.    Current Outpatient Prescriptions  Medication Sig Dispense Refill  . acetaminophen (TYLENOL) 500 MG tablet  Take 1,000 mg by mouth as needed. For pain       . azaTHIOprine (IMURAN) 50 MG tablet Take 1 tablet (50 mg total) by mouth daily. 1 in AM & 1/2 in evening  45 tablet  2  . Calcium Carbonate-Vit D-Min (CALCIUM 1200 PO) Take 1,200 mg by mouth daily.       . ferrous gluconate (FERGON) 325 MG tablet Take 325 mg by mouth daily with breakfast.        . Multiple Vitamins-Minerals (MULTIVITAMINS THER. W/MINERALS) TABS Take 1 tablet by mouth daily.        . traMADol (ULTRAM) 50 MG tablet TAKE 1 TABLET BY MOUTH TWICE DAILY AS NEEDED FOR PAIN.  45 tablet  4    Allergies as of 01/03/2013 - Review Complete 01/03/2013  Allergen Reaction Noted  . Penicillins Hives 01/25/2007    Family History  Problem Relation Age of Onset  . Cancer Father     prostate   . Prostate cancer Father   . Hypertension Sister   . Cancer Sister   . Depression Sister   . Breast cancer Sister   . COPD Sister   . Aneurysm Brother     deceased, brain aneurysm    History   Social History  . Marital Status: Married    Spouse Name: [name]    Number of Children: 78  . Years of Education: [name]   Occupational History  . disabled  Social History Main Topics  . Smoking status: Former Smoker -- 0.5 packs/day for 2 years    Types: Cigarettes    Quit date: 12/21/2009  . Smokeless tobacco: None  . Alcohol Use: No     Comment: Former drinker  . Drug Use: No  . Sexually Active: Yes -- Male partner(s)   Other Topics Concern  . None   Social History Narrative  . None    Review of Systems: Negative unless otherwise mentioned  Physical Exam: BP 91/57  Pulse 86  Temp 97.4 F (36.3 C) (Oral)  Ht 5' 4.5" (1.638 m)  Wt 143 lb (64.864 kg)  BMI 24.17 kg/m2 General:   Alert and oriented. No distress noted. Pleasant and cooperative.  Head:  Normocephalic and atraumatic. Eyes:  Conjuctiva clear without scleral icterus. Mouth:  Oral mucosa pink and moist. Good dentition. No lesions. Heart:  S1, S2 present without  murmurs, rubs, or gallops. Regular rate and rhythm. Abdomen:  +BS, soft, non-tender and non-distended. Multiple healed incisions, ileostomy RLQ with known prolapse of ileum, easily reducible, pink tissues and appear healthy. No rebound or guarding. No HSM or masses noted. Msk:  Symmetrical without gross deformities. Normal posture. Extremities:  Without edema. Neurologic:  Alert and  oriented x4;  grossly normal neurologically. Skin:  Intact without significant lesions or rashes. Psych:  Alert and cooperative. Normal mood and affect.

## 2013-01-04 ENCOUNTER — Other Ambulatory Visit: Payer: Self-pay | Admitting: Internal Medicine

## 2013-01-04 DIAGNOSIS — K5090 Crohn's disease, unspecified, without complications: Secondary | ICD-10-CM

## 2013-01-07 NOTE — Progress Notes (Signed)
Faxed to PCP

## 2013-01-07 NOTE — Assessment & Plan Note (Signed)
Complicated past medical hx, currently doing well on Imuran. Hgb remains overall stable. Notes RLQ discomfort intermittently, lasting about an hour, no other associated symptoms such as bloating, N/V. He does note hx of heavy lifting. With his already known hernias and multiple prior abdominal procedures, I have asked him to avoid heavy lifting and contact us if this continues. I discussed signs/symptoms that would necessitate emergent medical attention. He stated understanding. He is to call if this continues. Otherwise, visit in 6 months.

## 2013-02-01 ENCOUNTER — Emergency Department (HOSPITAL_COMMUNITY)
Admission: EM | Admit: 2013-02-01 | Discharge: 2013-02-01 | Disposition: A | Discharge status: Home / Self Care | Payer: Medicare Other | Attending: Emergency Medicine | Admitting: Emergency Medicine

## 2013-02-01 ENCOUNTER — Encounter (HOSPITAL_COMMUNITY): Payer: Self-pay | Admitting: Emergency Medicine

## 2013-02-01 DIAGNOSIS — R059 Cough, unspecified: Secondary | ICD-10-CM | POA: Insufficient documentation

## 2013-02-01 DIAGNOSIS — Z8719 Personal history of other diseases of the digestive system: Secondary | ICD-10-CM | POA: Insufficient documentation

## 2013-02-01 DIAGNOSIS — Z87891 Personal history of nicotine dependence: Secondary | ICD-10-CM | POA: Insufficient documentation

## 2013-02-01 DIAGNOSIS — B349 Viral infection, unspecified: Secondary | ICD-10-CM

## 2013-02-01 DIAGNOSIS — Z79899 Other long term (current) drug therapy: Secondary | ICD-10-CM | POA: Diagnosis not present

## 2013-02-01 DIAGNOSIS — R05 Cough: Secondary | ICD-10-CM | POA: Insufficient documentation

## 2013-02-01 DIAGNOSIS — R0602 Shortness of breath: Secondary | ICD-10-CM | POA: Diagnosis not present

## 2013-02-01 DIAGNOSIS — B9789 Other viral agents as the cause of diseases classified elsewhere: Secondary | ICD-10-CM | POA: Diagnosis not present

## 2013-02-01 DIAGNOSIS — Z8739 Personal history of other diseases of the musculoskeletal system and connective tissue: Secondary | ICD-10-CM | POA: Diagnosis not present

## 2013-02-01 DIAGNOSIS — J3489 Other specified disorders of nose and nasal sinuses: Secondary | ICD-10-CM | POA: Diagnosis not present

## 2013-02-01 DIAGNOSIS — Z8659 Personal history of other mental and behavioral disorders: Secondary | ICD-10-CM | POA: Insufficient documentation

## 2013-02-01 MED ORDER — BENZONATATE 100 MG PO CAPS: 100.0000 mg | ORAL_CAPSULE | Freq: Three times a day (TID) | ORAL | Status: DC | PRN

## 2013-02-01 NOTE — ED Notes (Signed)
Patient with no complaints at this time. Respirations even and unlabored. Skin warm/dry. Discharge instructions reviewed with patient at this time. Patient given opportunity to voice concerns/ask questions.Reviewed My Chart application. Patient discharged at this time and left Emergency Department with steady gait.

## 2013-02-01 NOTE — ED Notes (Signed)
Flu like symptoms for three days. Family sick with same.

## 2013-02-01 NOTE — ED Notes (Addendum)
Intermittent cough x 3 days.  Treating w/Tylenol flu OTC.  No c/o pain, n/v/d or fevers. No body aches.  Cough does not interrupt sleep. Lungs clear bilaterally. States he came in because wife wanted him to.

## 2013-02-01 NOTE — ED Provider Notes (Signed)
History    This chart was scribed for non-physician practitioner working with Alfonzo Feller, DO by Hampton Abbot, ED Scribe. This patient was seen in room APA11/APA11 and the patient's care was started at 9:17 AM.    CSN: [id number]  Arrival date & time 02/01/13  0840   First MD Initiated Contact with Patient 02/01/13 [phone]      Chief Complaint  Patient presents with  . Influenza    The history is provided by the patient. No language interpreter was used.  Daniel Howard is a 57 y.o. male who presents to the Emergency Department complaining of 3 days of gradual onset dry cough with some waxing-and-waning mild exertional dyspnea but no wheezing.  Pt also c/o clear rhinorrhea.  Pt denies HA, fever, swelling in legs, abdominal pain, nausea, emesis, diarrhea.  Pt has 2 contacts that have been diagnosed with influenza in the past week.  Pt has h/o Crohn's Disease but has no current related complaints. PCP is Dr. [name].  GI is Dr. Sydell Axon.  Past Medical History  Diagnosis Date  . Anxiety   . Low back pain   . 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 Abnormal finding of biliary tract     MRCP shows pancreatic/biliary tract dilation. EUS 2010 confirmed dilation but no chronic pancreaitis or mass. Vascular ectasia crimpoing distal CBD.   [name] Spigelian hernia     bilateral  . Duodenal ulcer 2010    nsaids  . Peristomal hernia   . Small bowel obstruction     Past Surgical History  Procedure Laterality Date  . Colostomy    . Colectomy    . Hernia repair      incarcerated periostial hernia  . Esophagogastroduodenoscopy  05/2009    SLF: multiple antral erosions, large ulcer at ansatomosis (postsurgical changes at duodenal bulb and second portion of duodenum) BX c/x NSAIDS.    Family History  Problem Relation Age of Onset  .  Cancer Father     prostate   . Prostate cancer Father   . Hypertension Sister   . Cancer Sister   . Depression Sister   . Breast cancer Sister   . COPD Sister   . Aneurysm Brother     deceased, brain aneurysm    History  Substance Use Topics  . Smoking status: Former Smoker -- 0.50 packs/day for 2 years    Types: Cigarettes    Quit date: 12/21/2009  . Smokeless tobacco: Not on file  . Alcohol Use: No     Comment: Former drinker      Review of Systems  Constitutional: Negative for fever.  HENT: Positive for rhinorrhea. Negative for congestion, sore throat and neck pain.   Eyes: Negative.   Respiratory: Positive for cough and shortness of breath. Negative for chest tightness and wheezing.   Cardiovascular: Negative for chest pain and leg swelling.  Gastrointestinal: Negative for nausea, vomiting, abdominal pain and diarrhea.  Genitourinary: Negative.   Musculoskeletal: Negative for joint swelling and arthralgias.  Skin: Negative.  Negative for rash and wound.  Neurological: Negative for dizziness, weakness, light-headedness, numbness and headaches.  Psychiatric/Behavioral: Negative.     Allergies  Penicillins  Home Medications   Current Outpatient Rx  Name  Route  Sig  Dispense  Refill  . acetaminophen (TYLENOL) 500 MG tablet   Oral  Take 1,000 mg by mouth as needed. For pain          . azaTHIOprine (IMURAN) 50 MG tablet   Oral   Take 1 tablet (50 mg total) by mouth daily. 1 in AM & 1/2 in evening   45 tablet   2   . benzonatate (TESSALON) 100 MG capsule   Oral   Take 1 capsule (100 mg total) by mouth 3 (three) times daily as needed for cough.   30 capsule   0   . Calcium Carbonate-Vit D-Min (CALCIUM 1200 PO)   Oral   Take 1,200 mg by mouth daily.          . ferrous gluconate (FERGON) 325 MG tablet   Oral   Take 325 mg by mouth daily with breakfast.           . Multiple Vitamins-Minerals (MULTIVITAMINS THER. W/MINERALS) TABS   Oral   Take 1  tablet by mouth daily.           . traMADol (ULTRAM) 50 MG tablet      TAKE 1 TABLET BY MOUTH TWICE DAILY AS NEEDED FOR PAIN.   45 tablet   4     BP 106/63  Temp(Src) 97.8 F (36.6 C) (Oral)  Resp 18  Ht 5' 4.5" (1.638 m)  Wt 145 lb (65.772 kg)  BMI 24.51 kg/m2  SpO2 100%  Physical Exam  Nursing note and vitals reviewed. Constitutional: He appears well-developed and well-nourished.  HENT:  Head: Normocephalic and atraumatic.  Mouth/Throat: Oropharynx is clear and moist.  Eyes: Conjunctivae are normal.  Neck: Normal range of motion.  Cardiovascular: Normal rate, regular rhythm, normal heart sounds and intact distal pulses.   Pulmonary/Chest: Effort normal and breath sounds normal. He has no wheezes.  Abdominal: Soft. Bowel sounds are normal. There is no tenderness.  Musculoskeletal: Normal range of motion.  Neurological: He is alert.  Skin: Skin is warm and dry.  Psychiatric: He has a normal mood and affect.    ED Course  Procedures (including critical care time) DIAGNOSTIC STUDIES: Oxygen Saturation is 100% on room air, normal by my interpretation.    COORDINATION OF CARE: 9:43 AM- Informed pt that influenza exposures and current symptoms point towards a suspicion of virus.  Discussed with pt at-home approach to improving symptoms.  1. Viral syndrome       MDM  Viral syndrome with cough,  No respiratory distress on exam, vss.  Encouraged rest,  Fluids,  Tessalon prescribed for cough.  The patient appears reasonably screened and/or stabilized for discharge and I doubt any other medical condition or other [facility] requiring further screening, evaluation, or treatment in the ED at this time prior to discharge.  I personally performed the services described in this documentation, which was scribed in my presence. The recorded information has been reviewed and is accurate.         Evalee Jefferson, [place] 02/01/13 1113

## 2013-02-02 NOTE — ED Provider Notes (Signed)
Medical screening examination/treatment/procedure(s) were performed by non-physician practitioner and as supervising physician I was immediately available for consultation/collaboration.   Alfonzo Feller, DO 02/02/13 1553

## 2013-02-04 ENCOUNTER — Emergency Department (HOSPITAL_COMMUNITY)
Admission: EM | Admit: 2013-02-04 | Discharge: 2013-02-04 | Disposition: A | Discharge status: Home / Self Care | Payer: Medicare Other | Attending: Emergency Medicine | Admitting: Emergency Medicine

## 2013-02-04 ENCOUNTER — Encounter (HOSPITAL_COMMUNITY): Payer: Self-pay | Admitting: Emergency Medicine

## 2013-02-04 DIAGNOSIS — Z8659 Personal history of other mental and behavioral disorders: Secondary | ICD-10-CM | POA: Insufficient documentation

## 2013-02-04 DIAGNOSIS — Z8719 Personal history of other diseases of the digestive system: Secondary | ICD-10-CM | POA: Diagnosis not present

## 2013-02-04 DIAGNOSIS — H6092 Unspecified otitis externa, left ear: Secondary | ICD-10-CM

## 2013-02-04 DIAGNOSIS — Z87891 Personal history of nicotine dependence: Secondary | ICD-10-CM | POA: Diagnosis not present

## 2013-02-04 DIAGNOSIS — H60399 Other infective otitis externa, unspecified ear: Secondary | ICD-10-CM | POA: Insufficient documentation

## 2013-02-04 MED ORDER — NEOMYCIN-POLYMYXIN-HC 3.5-10000-1 OT SUSP: 4.0000 [drp] | Freq: Three times a day (TID) | OTIC | Status: DC

## 2013-02-04 NOTE — ED Notes (Signed)
Pt c/o left ear pain x3 days.

## 2013-02-07 ENCOUNTER — Other Ambulatory Visit: Payer: Self-pay

## 2013-02-07 DIAGNOSIS — K5090 Crohn's disease, unspecified, without complications: Secondary | ICD-10-CM

## 2013-02-07 NOTE — ED Provider Notes (Signed)
History     CSN: [id number]  Arrival date & time 02/04/13  1311   First MD Initiated Contact with Patient 02/04/13 1415      Chief Complaint  Patient presents with  . Otalgia    (Consider location/radiation/quality/duration/timing/severity/associated sxs/prior treatment) Patient is a 57 y.o. male presenting with ear pain. The history is provided by the patient.  Otalgia Location:  Left Behind ear:  No abnormality Quality:  Aching Severity:  Moderate Onset quality:  Gradual Timing:  Intermittent Progression:  Unchanged Chronicity:  New Relieved by:  Nothing Worsened by:  Position and palpation Ineffective treatments:  OTC medications Associated symptoms: no abdominal pain, no congestion, no cough, no ear discharge, no fever, no headaches, no neck pain, no rash, no sore throat and no tinnitus     Past Medical History  Diagnosis Date  . Anxiety   . Low back pain   . 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 Abnormal finding of biliary tract     MRCP shows pancreatic/biliary tract dilation. EUS 2010 confirmed dilation but no chronic pancreaitis or mass. Vascular ectasia crimpoing distal CBD.   [name] Spigelian hernia     bilateral  . Duodenal ulcer 2010    nsaids  . Peristomal hernia   . Small bowel obstruction     Past Surgical History  Procedure Laterality Date  . Colostomy    . Colectomy    . Hernia repair      incarcerated periostial hernia  . Esophagogastroduodenoscopy  05/2009    SLF: multiple antral erosions, large ulcer at ansatomosis (postsurgical changes at duodenal bulb and second portion of duodenum) BX c/x NSAIDS.    Family History  Problem Relation Age of Onset  . Cancer Father     prostate   . Prostate cancer Father   . Hypertension Sister   . Cancer Sister   . Depression Sister   . Breast  cancer Sister   . COPD Sister   . Aneurysm Brother     deceased, brain aneurysm    History  Substance Use Topics  . Smoking status: Former Smoker -- 0.50 packs/day for 2 years    Types: Cigarettes    Quit date: 12/21/2009  . Smokeless tobacco: Not on file  . Alcohol Use: No     Comment: Former drinker      Review of Systems  Constitutional: Negative for fever.  HENT: Positive for ear pain. Negative for congestion, sore throat, neck pain, tinnitus and ear discharge.   Eyes: Negative.   Respiratory: Negative for cough, chest tightness and shortness of breath.   Cardiovascular: Negative for chest pain.  Gastrointestinal: Negative for nausea and abdominal pain.  Genitourinary: Negative.   Musculoskeletal: Negative for joint swelling and arthralgias.  Skin: Negative.  Negative for rash and wound.  Neurological: Negative for dizziness, weakness, light-headedness, numbness and headaches.  Psychiatric/Behavioral: Negative.     Allergies  Penicillins  Home Medications   Current Outpatient Rx  Name  Route  Sig  Dispense  Refill  . acetaminophen (TYLENOL) 500 MG tablet   Oral   Take 1,000 mg by mouth as needed. For pain          . azaTHIOprine (IMURAN) 50 MG tablet   Oral   Take 25-50 mg by mouth 2 (two) times daily. 1 in AM & 1/2 in evening         .  Calcium Carbonate-Vit D-Min (CALCIUM 1200 PO)   Oral   Take 1,200 mg by mouth daily.          . ferrous gluconate (FERGON) 325 MG tablet   Oral   Take 325 mg by mouth daily with breakfast.           . Multiple Vitamins-Minerals (MULTIVITAMINS THER. W/MINERALS) TABS   Oral   Take 1 tablet by mouth daily.           [name] neomycin-polymyxin-hydrocortisone (CORTISPORIN) 3.5-10000-1 otic suspension   Otic   Place 4 drops in ear(s) 3 (three) times daily.   10 mL   0     BP 136/93  Pulse 92  Temp(Src) 98.1 F (36.7 C)  Resp 16  SpO2 98%  Physical Exam  Constitutional: He is oriented to person, place, and  time. He appears well-developed and well-nourished.  HENT:  Head: Normocephalic and atraumatic.  Right Ear: Tympanic membrane, external ear and ear canal normal.  Left Ear: Hearing, tympanic membrane and ear canal normal. There is swelling and tenderness. No drainage. No foreign bodies. No mastoid tenderness. Tympanic membrane is not injected, not erythematous, not retracted and not bulging. No hemotympanum.  Nose: Mucosal edema and rhinorrhea present.  Mouth/Throat: Uvula is midline, oropharynx is clear and moist and mucous membranes are normal. No oropharyngeal exudate, posterior oropharyngeal edema, posterior oropharyngeal erythema or tonsillar abscesses.  Pain with traction of left ear.  There is erythema and edema of external left ear canal without exudate.  TM normal appearance.  Outer ear also normal appearance.  No mastoid tenderness.  Eyes: Conjunctivae are normal.  Cardiovascular: Normal rate and normal heart sounds.   Pulmonary/Chest: Effort normal. No respiratory distress. He has no wheezes. He has no rales.  Abdominal: Soft. There is no tenderness.  Musculoskeletal: Normal range of motion.  Neurological: He is alert and oriented to person, place, and time.  Skin: Skin is warm and dry. No rash noted.  Psychiatric: He has a normal mood and affect.    ED Course  Procedures (including critical care time)  Labs Reviewed - No data to display No results found.   1. Otitis externa of left ear       MDM  Pt placed on cortisporin otic suspension.  He was encouraged close f/u if sx not improved.  Pt was seen here 3 days ago for viral syndrome including cough and fever.  States sx improved.        Evalee Jefferson, [place] 02/07/13 [phone]

## 2013-02-10 ENCOUNTER — Encounter: Payer: Self-pay | Admitting: Family Medicine

## 2013-02-10 ENCOUNTER — Ambulatory Visit (INDEPENDENT_AMBULATORY_CARE_PROVIDER_SITE_OTHER): Payer: Medicare Other | Admitting: Family Medicine

## 2013-02-10 VITALS — BP 128/70 | HR 88 | Resp 18 | Ht 66.0 in | Wt 144.0 lb

## 2013-02-10 DIAGNOSIS — K5090 Crohn's disease, unspecified, without complications: Secondary | ICD-10-CM | POA: Diagnosis not present

## 2013-02-10 DIAGNOSIS — L0292 Furuncle, unspecified: Secondary | ICD-10-CM

## 2013-02-10 DIAGNOSIS — H60399 Other infective otitis externa, unspecified ear: Secondary | ICD-10-CM

## 2013-02-10 DIAGNOSIS — H6090 Unspecified otitis externa, unspecified ear: Secondary | ICD-10-CM

## 2013-02-10 MED ORDER — SULFAMETHOXAZOLE-TRIMETHOPRIM 800-160 MG PO TABS: 1.0000 | ORAL_TABLET | Freq: Two times a day (BID) | ORAL | Status: AC

## 2013-02-10 NOTE — Patient Instructions (Addendum)
Take the antibiotics as prescribed  Warm compresses Call if this does not improve  Continue all other medication F/U 6 months   Abscess An abscess (boil or furuncle) is an infected area on or under the skin. This area is filled with yellowish-white fluid (pus) and other material (debris). HOME CARE   Only take medicines as told by your doctor.  If you were given antibiotic medicine, take it as directed. Finish the medicine even if you start to feel better.  If gauze is used, follow your doctor's directions for changing the gauze.  To avoid spreading the infection:  Keep your abscess covered with a bandage.  Wash your hands well.  Do not share personal care items, towels, or whirlpools with others.  Avoid skin contact with others.  Keep your skin and clothes clean around the abscess.  Keep all doctor visits as told. GET HELP RIGHT AWAY IF:   You have more pain, puffiness (swelling), or redness in the wound site.  You have more fluid or blood coming from the wound site.  You have muscle aches, chills, or you feel sick.  You have a fever. MAKE SURE YOU:   Understand these instructions.  Will watch your condition.  Will get help right away if you are not doing well or get worse. Document Released: 05/26/2008 Document Revised: 06/08/2012 Document Reviewed: 02/20/2012 [facility] Patient Information 2013 [place].

## 2013-02-10 NOTE — Progress Notes (Signed)
  Subjective:    Patient ID: Daniel Howard, male    DOB: 1956/09/23, 57 y.o.   MRN: [id number]  HPI Patient here to follow chronic medical problems. He was seen in the ED for viral URI and otitis media he's been taking ear drops for possibly 6 days now for the left side. He noticed for the past 3 days he has some boils beneath his left axilla mildly tender, no drainage no fever Is been doing well with his Crohn's disease he did have a small bout abdominal pain 3 weeks ago but it resolved quickly.    Review of Systems  GEN- denies fatigue, fever, weight loss,weakness, recent illness HEENT- denies eye drainage, change in vision, nasal discharge, CVS- denies chest pain, palpitations RESP- denies SOB, cough, wheeze ABD- denies N/V, change in stools, abd pain GU- denies dysuria, hematuria, dribbling, incontinence MSK- denies joint pain, muscle aches, injury Neuro- denies headache, dizziness, syncope, seizure activity      Objective:   Physical Exam GEN- NAD, alert and oriented x3 HEENT- PERRL, EOMI, non injected sclera, pink conjunctiva, MMM, oropharynx clear, TM clear bilat, scarring left TM CVS- RRR, no murmur RESP-CTAB ABS-NABS,soft,NT,ND, Ostomy bag in place- yellow/brown stool in bag EXT- No edema Pulses- Radial 2+ Skin- left axilla- small boil dime size- mild TTP, mild fluctuance no erythema surrounding, small pustules 1cm above boil and 1 cm below Scab with mild pus at midline scar near ostomy        Assessment & Plan:

## 2013-02-11 DIAGNOSIS — L0292 Furuncle, unspecified: Secondary | ICD-10-CM | POA: Insufficient documentation

## 2013-02-11 DIAGNOSIS — H6090 Unspecified otitis externa, unspecified ear: Secondary | ICD-10-CM | POA: Insufficient documentation

## 2013-02-11 NOTE — Assessment & Plan Note (Signed)
He will complete one more day of antibiotics ear infection has resolved

## 2013-02-11 NOTE — Assessment & Plan Note (Signed)
He is currently doing well he is followed by GI he is due for CBC in March

## 2013-02-11 NOTE — Assessment & Plan Note (Addendum)
Bactrim twice a day warm compresses I do not see one to open today He has a small scab near his incision she states he gets when he is changing his bag the antibiotics will cover any superficial infection

## 2013-02-14 NOTE — ED Provider Notes (Signed)
Medical screening examination/treatment/procedure(s) were performed by non-physician practitioner and as supervising physician I was immediately available for consultation/collaboration.  Babette Relic, MD 02/14/13 [phone]

## 2013-05-10 ENCOUNTER — Telehealth: Payer: Self-pay

## 2013-05-10 ENCOUNTER — Other Ambulatory Visit: Payer: Self-pay

## 2013-05-10 MED ORDER — SULFAMETHOXAZOLE-TRIMETHOPRIM 800-160 MG PO TABS: 1.0000 | ORAL_TABLET | Freq: Two times a day (BID) | ORAL | Status: DC

## 2013-05-10 MED ORDER — SULFAMETHOXAZOLE-TRIMETHOPRIM 800-160 MG PO TABS: 1.0000 | ORAL_TABLET | Freq: Two times a day (BID) | ORAL | Status: AC

## 2013-05-10 NOTE — Telephone Encounter (Signed)
Bactrim refilled, if no improvement needs OV

## 2013-05-10 NOTE — Telephone Encounter (Signed)
Patient aware.

## 2013-05-11 ENCOUNTER — Other Ambulatory Visit: Payer: Self-pay

## 2013-05-11 ENCOUNTER — Telehealth: Payer: Self-pay | Admitting: Family Medicine

## 2013-05-11 NOTE — Telephone Encounter (Signed)
Patient aware that septra sent and received by pharmacy.

## 2013-05-23 ENCOUNTER — Encounter (HOSPITAL_COMMUNITY): Payer: Self-pay | Admitting: *Deleted

## 2013-05-23 ENCOUNTER — Emergency Department (HOSPITAL_COMMUNITY): Payer: Medicare Other

## 2013-05-23 ENCOUNTER — Emergency Department (HOSPITAL_COMMUNITY)
Admission: EM | Admit: 2013-05-23 | Discharge: 2013-05-23 | Disposition: A | Discharge status: Home / Self Care | Payer: Medicare Other | Attending: Emergency Medicine | Admitting: Emergency Medicine

## 2013-05-23 ENCOUNTER — Other Ambulatory Visit: Payer: Self-pay

## 2013-05-23 DIAGNOSIS — R0789 Other chest pain: Secondary | ICD-10-CM | POA: Insufficient documentation

## 2013-05-23 DIAGNOSIS — R079 Chest pain, unspecified: Secondary | ICD-10-CM | POA: Diagnosis not present

## 2013-05-23 DIAGNOSIS — Z8659 Personal history of other mental and behavioral disorders: Secondary | ICD-10-CM | POA: Insufficient documentation

## 2013-05-23 DIAGNOSIS — J438 Other emphysema: Secondary | ICD-10-CM | POA: Diagnosis not present

## 2013-05-23 DIAGNOSIS — Z87891 Personal history of nicotine dependence: Secondary | ICD-10-CM | POA: Diagnosis not present

## 2013-05-23 DIAGNOSIS — Z79899 Other long term (current) drug therapy: Secondary | ICD-10-CM | POA: Diagnosis not present

## 2013-05-23 DIAGNOSIS — Z8719 Personal history of other diseases of the digestive system: Secondary | ICD-10-CM | POA: Insufficient documentation

## 2013-05-23 DIAGNOSIS — K5090 Crohn's disease, unspecified, without complications: Secondary | ICD-10-CM | POA: Diagnosis not present

## 2013-05-23 DIAGNOSIS — K269 Duodenal ulcer, unspecified as acute or chronic, without hemorrhage or perforation: Secondary | ICD-10-CM | POA: Diagnosis not present

## 2013-05-23 LAB — COMPREHENSIVE METABOLIC PANEL
AST: 22 U/L (ref 0–37)
BUN: 15 mg/dL (ref 6–23)
CO2: 30 mEq/L (ref 19–32)
Chloride: 103 mEq/L (ref 96–112)
Creatinine, Ser: 1.33 mg/dL (ref 0.50–1.35)
GFR calc non Af Amer: 59 mL/min — ABNORMAL LOW (ref >90)
Total Bilirubin: 0.3 mg/dL (ref 0.3–1.2)

## 2013-05-23 LAB — CBC WITH DIFFERENTIAL/PLATELET
HCT: 34.6 % — ABNORMAL LOW (ref 39.0–52.0)
Hemoglobin: 12.3 g/dL — ABNORMAL LOW (ref 13.0–17.0)
Lymphocytes Relative: 25 % (ref 12–46)
Monocytes Absolute: 0.5 10*3/uL (ref 0.1–1.0)
Monocytes Relative: 9 % (ref 3–12)
Neutro Abs: 3.5 10*3/uL (ref 1.7–7.7)
WBC: 5.9 10*3/uL (ref 4.0–10.5)

## 2013-05-23 LAB — PROTIME-INR: Prothrombin Time: 13.5 seconds (ref 11.6–15.2)

## 2013-05-23 LAB — TROPONIN I: Troponin I: <0.3 ng/mL (ref <0.30)

## 2013-05-23 NOTE — ED Provider Notes (Signed)
History  This chart was scribed for Daniel Christen, MD by Elby Beck, ED Scribe. This patient was seen in room APA04/APA04 and the patient's care was started at 9:05 PM.   CSN: [id number]  Arrival date & time 05/23/13  2021      Chief Complaint  Patient presents with  . Chest Pain     The history is provided by the patient. No language interpreter was used.    HPI Comments: Daniel Howard is a 57 y.o. male with a h/o Krohn's disease who presents to the Emergency Department complaining of intermittent, moderate chest pain onset 5 hours ago. Pt is not in distress currently and states that his pain lasted 3 hours and is essentially gone  Pt states that he was taking a deep breath and had a sudden onset of sharp left-sided chest pain. Pt states that his pain was worsened with yawning and deep inspiration. Pt denies significant PMH of heart issues. Pt denies SOB, sweating, vomiting or any other symptoms.  Past Medical History  Diagnosis Date  . Anxiety   . Low back pain   . 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 Abnormal finding of biliary tract     MRCP shows pancreatic/biliary tract dilation. EUS 2010 confirmed dilation but no chronic pancreaitis or mass. Vascular ectasia crimpoing distal CBD.   [name] Spigelian hernia     bilateral  . Duodenal ulcer 2010    nsaids  . Peristomal hernia   . Small bowel obstruction     Past Surgical History  Procedure Laterality Date  . Colostomy    . Colectomy    . Hernia repair      incarcerated periostial hernia  . Esophagogastroduodenoscopy  05/2009    SLF: multiple antral erosions, large ulcer at ansatomosis (postsurgical changes at duodenal bulb and second portion of duodenum) BX c/x NSAIDS.    Family History  Problem Relation Age of Onset  . Cancer Father     prostate   .  Prostate cancer Father   . Hypertension Sister   . Cancer Sister   . Depression Sister   . Breast cancer Sister   . COPD Sister   . Aneurysm Brother     deceased, brain aneurysm    History  Substance Use Topics  . Smoking status: Former Smoker -- 0.50 packs/day for 2 years    Types: Cigarettes    Quit date: 12/21/2009  . Smokeless tobacco: Not on file  . Alcohol Use: No     Comment: Former drinker      Review of Systems  Respiratory: Negative for shortness of breath.   Cardiovascular: Positive for chest pain.  Gastrointestinal: Negative for vomiting.  All other systems reviewed and are negative.    Allergies  Penicillins  Home Medications   Current Outpatient Rx  Name  Route  Sig  Dispense  Refill  . acetaminophen (TYLENOL) 500 MG tablet   Oral   Take 1,000 mg by mouth as needed. For pain          . azaTHIOprine (IMURAN) 50 MG tablet   Oral   Take 25-50 mg by mouth 2 (two) times daily. 1 in AM & 1/2 in evening         . Calcium Carbonate-Vit D-Min (CALCIUM 1200 PO)   Oral   Take 1,200 mg by mouth  daily.          . ferrous gluconate (FERGON) 325 MG tablet   Oral   Take 325 mg by mouth daily with breakfast.           . Multiple Vitamins-Minerals (MULTIVITAMINS THER. W/MINERALS) TABS   Oral   Take 1 tablet by mouth daily.           [name] neomycin-polymyxin-hydrocortisone (CORTISPORIN) 3.5-10000-1 otic suspension   Otic   Place 4 drops in ear(s) 3 (three) times daily.   10 mL   0     Triage Vitals: BP 100/64  Pulse 72  Temp(Src) 98.1 F (36.7 C) (Oral)  Resp 20  Ht 5' 4"  (1.626 m)  Wt 145 lb (65.772 kg)  BMI 24.88 kg/m2  SpO2 100%  Physical Exam  Nursing note and vitals reviewed. Constitutional: He is oriented to person, place, and time. He appears well-developed and well-nourished.  HENT:  Head: Normocephalic and atraumatic.  Eyes: Conjunctivae and EOM are normal. Pupils are equal, round, and reactive to light.  Neck: Normal range of  motion. Neck supple.  Cardiovascular: Normal rate, regular rhythm and normal heart sounds.   Pulmonary/Chest: Effort normal and breath sounds normal.  Abdominal: Soft. Bowel sounds are normal.  Musculoskeletal: Normal range of motion.  Neurological: He is alert and oriented to person, place, and time.  Skin: Skin is warm and dry.  Psychiatric: He has a normal mood and affect.    ED Course  Procedures (including critical care time)  DIAGNOSTIC STUDIES: Oxygen Saturation is 100% on RA, normal by my interpretation.    COORDINATION OF CARE: 9:35 PM- Pt advised of plan to receive diagnostic radiology and pt agrees.    Labs Reviewed  CBC WITH DIFFERENTIAL - Abnormal; Notable for the following:    RBC 3.69 (*)    Hemoglobin 12.3 (*)    HCT 34.6 (*)    Eosinophils Relative 6 (*)    All other components within normal limits  COMPREHENSIVE METABOLIC PANEL - Abnormal; Notable for the following:    Albumin 3.2 (*)    GFR calc non Af Amer 59 (*)    GFR calc Af Amer 68 (*)    All other components within normal limits  TROPONIN I  PROTIME-INR   Dg Chest 2 View  05/23/2013   *RADIOLOGY REPORT*  Clinical Data: Chest pain with inspiration.  CHEST - 2 VIEW  Comparison: None.  Findings: Hyperinflation is present compatible with emphysema. There is enlargement retrosternal clear space and flattening of both hemidiaphragms.  There is no airspace disease.  No effusion. No pneumothorax.  Cardiopericardial silhouette appears within normal limits.  IMPRESSION: No acute cardiopulmonary disease.  Hyperinflation consistent with emphysema.   Original Report Authenticated By: Dereck Ligas, M.D.    Date: 05/23/2013  Rate: 70  Rhythm: normal sinus rhythm  QRS Axis: normal  Intervals: normal  ST/T Wave abnormalities: normal  Conduction Disutrbances: none  Narrative Interpretation: unremarkable     No diagnosis found.    MDM  History and physical incongruent with pulmonary embolism or acute  coronary syndrome. Symptoms were brief and fleeting. Minimal cardiac risk factor profile. He is now symptom-free         I personally performed the services described in this documentation, which was scribed in my presence. The recorded information has been reviewed and is accurate.    Daniel Christen, MD 05/23/13 2322

## 2013-05-23 NOTE — ED Notes (Signed)
Lt chest pain for 1-2 hours,sl sob, no sweats

## 2013-05-23 NOTE — ED Notes (Signed)
EKG performed in Triage; no old one on file in [facility], EKG handed to Dr Roderic Palau.

## 2013-05-23 NOTE — ED Notes (Signed)
Pt reporting left sided chest pain this evening, but denies any at present time.  Denies nausea or vomiting associated with CP.  Pt reports that prior to pain beginning he had been huffing compressed air, and also had dinner consisting of fried fish and coleslaw.  Pt denies complaints at present.

## 2013-07-11 ENCOUNTER — Other Ambulatory Visit: Payer: Self-pay | Admitting: Urgent Care

## 2013-07-15 ENCOUNTER — Ambulatory Visit (INDEPENDENT_AMBULATORY_CARE_PROVIDER_SITE_OTHER): Payer: Medicare Other | Admitting: Internal Medicine

## 2013-07-15 ENCOUNTER — Encounter: Payer: Self-pay | Admitting: Internal Medicine

## 2013-07-15 VITALS — BP 98/63 | HR 66 | Temp 97.4°F | Ht 64.5 in | Wt 141.6 lb

## 2013-07-15 DIAGNOSIS — K439 Ventral hernia without obstruction or gangrene: Secondary | ICD-10-CM | POA: Diagnosis not present

## 2013-07-15 DIAGNOSIS — K5090 Crohn's disease, unspecified, without complications: Secondary | ICD-10-CM

## 2013-07-15 DIAGNOSIS — K50913 Crohn's disease, unspecified, with fistula: Secondary | ICD-10-CM

## 2013-07-15 NOTE — Progress Notes (Signed)
Primary Care Physician:  Vic Blackbird, MD Primary Gastroenterologist:  Dr. Gala Romney  Pre-Procedure History & Physical: HPI:  Daniel Howard is a 57 y.o. male here for followup of complicated fistulotomy Crohn's disease. Crohn's disease.  He's done well since his last office visit although he has had 3 episodes of abdominal pain and perceived obstructing symptoms for which he last BM and apply gentle pressure to his abdomen and symptoms settled down. They last no more than a couple of hours-3 episodes since his last office visit. Has not had any associated nausea or vomiting. B12 level previously normal. He has a mild chronic anemia tolerating Imuran very well. He has known ventral/DL he had hernias. Has not felt to be a candidate for any further surgery.  Past Medical History  Diagnosis Date  . Anxiety   . Low back pain   . 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 Abnormal finding of biliary tract     MRCP shows pancreatic/biliary tract dilation. EUS 2010 confirmed dilation but no chronic pancreaitis or mass. Vascular ectasia crimpoing distal CBD.   [name] Spigelian hernia     bilateral  . Duodenal ulcer 2010    nsaids  . Peristomal hernia   . Small bowel obstruction     Past Surgical History  Procedure Laterality Date  . Colostomy  1992    transverse loop colostomy secondary to a stricture  . Hemicoloectomy w/ anastomosis  1993    R- Dr.DeMason   . Hernia repair  1996    incarcerated periostial hernia  . Esophagogastroduodenoscopy  05/2009    SLF: multiple antral erosions, large ulcer at ansatomosis (postsurgical changes at duodenal bulb and second portion of duodenum) BX c/x NSAIDS.  [name] Flexible sigmoidoscopy  1988    Dr. Laural Golden- suggested rohn's disease but the biopsies were not collaborative.  [name] Appendectomy  1992  . Eus   10/04/2009    Dr. Estill Bakes dilated CBD and main pancreatic duct.  No pancreatic    Prior to Admission medications   Medication Sig Start Date End Date Taking? Authorizing Provider  acetaminophen (TYLENOL) 500 MG tablet Take 1,000 mg by mouth as needed. For pain    Yes Historical Provider, MD  azaTHIOprine (IMURAN) 50 MG tablet TAKE 1 TABLET EVERY MORNING AND 1/2 TABLET EVERY EVENING 07/11/13  Yes Orvil Feil, NP  Calcium Carbonate-Vit D-Min (CALCIUM 1200 PO) Take 1,200 mg by mouth daily.    Yes Historical Provider, MD  ferrous gluconate (FERGON) 325 MG tablet Take 325 mg by mouth daily with breakfast.     Yes Historical Provider, MD  Multiple Vitamins-Minerals (MULTIVITAMINS THER. W/MINERALS) TABS Take 1 tablet by mouth daily.     Yes Historical Provider, MD    Allergies as of 07/15/2013 - Review Complete 07/15/2013  Allergen Reaction Noted  . Penicillins Hives 01/25/2007    Family History  Problem Relation Age of Onset  . Cancer Father     prostate   . Prostate cancer Father   . Hypertension Sister   . Cancer Sister   . Depression Sister   . Breast cancer Sister   . COPD Sister   . Aneurysm Brother     deceased, brain aneurysm    History   Social History  . Marital Status: Married    Spouse Name: [name]    Number of Children: 9  .  Years of Education: [name]   Occupational History  . disabled    Social History Main Topics  . Smoking status: Former Smoker -- 0.50 packs/day for 2 years    Types: Cigarettes    Quit date: 12/21/2009  . Smokeless tobacco: Not on file  . Alcohol Use: No     Comment: Former drinker  . Drug Use: No  . Sexually Active: Yes -- Male partner(s)   Other Topics Concern  . Not on file   Social History Narrative  . No narrative on file    Review of Systems: See HPI, otherwise negative ROS  Physical Exam: BP 98/63  Pulse 66  Temp(Src) 97.4 F (36.3 C) (Oral)  Ht 5' 4.5" (1.638 m)  Wt 141 lb 9.6 oz (64.229 kg)  BMI 23.94  kg/m2 General:   Alert,  Well-developed, well-nourished, pleasant and cooperative in NAD he is accompanied by multiple family members. Skin:  Intact without significant lesions or rashes. Eyes:  Sclera clear, no icterus.   Conjunctiva pink. Ears:  Normal auditory acuity. Nose:  No deformity, discharge,  or lesions. Mouth:  No deformity or lesions. Neck:  Supple; no masses or thyromegaly. No significant cervical adenopathy. Lungs:  Clear throughout to auscultation.   No wheezes, crackles, or rhonchi. No acute distress. Heart:  Regular rate and rhythm; no murmurs, clicks, rubs,  or gallops. Abdomen: Extensive keloid scarring. Colostomy right lower quadrant - looks good. Multiple easily reducible ventral hernias. Abdomen is soft and nontender Pulses:  Normal pulses noted. Extremities:  Without clubbing or edema.  Impression/Plan:  Complicated fistulzing  Crohn's disease   History of multiple surgeries. Multiple abdomiinal hernias. Sounds like he does have low-grade episodes of partial bowel obstructions  which resolve rapidly.   Only 3 episodes in 6 months. I doubt we can improve on this scenario much beyond  keeping him on a low residue diet and continuing Imuran.  Recommendations:   Provide literature on low residue diet. Repeat CBC in 4 months. Office visit here in 6 months.  If severe abdominal pain, nausea and vomiting, patient needs to go to the ER immediately.

## 2013-07-15 NOTE — Patient Instructions (Addendum)
Maintain a low residue diet (provide information)  Continue Imuran daily  CBC in 4 months  Office visit in 6 months

## 2013-07-18 ENCOUNTER — Other Ambulatory Visit: Payer: Self-pay

## 2013-07-18 DIAGNOSIS — K50913 Crohn's disease, unspecified, with fistula: Secondary | ICD-10-CM

## 2013-07-19 NOTE — Progress Notes (Signed)
Reminder in epic °

## 2013-07-29 ENCOUNTER — Encounter: Payer: Self-pay | Admitting: Family Medicine

## 2013-07-29 ENCOUNTER — Ambulatory Visit (INDEPENDENT_AMBULATORY_CARE_PROVIDER_SITE_OTHER): Payer: Medicare Other | Admitting: Family Medicine

## 2013-07-29 VITALS — BP 100/68 | HR 78 | Temp 97.0°F | Resp 18 | Wt 144.0 lb

## 2013-07-29 DIAGNOSIS — K5090 Crohn's disease, unspecified, without complications: Secondary | ICD-10-CM | POA: Diagnosis not present

## 2013-07-29 MED ORDER — HYDROCODONE-ACETAMINOPHEN 5-325 MG PO TABS: 1.0000 | ORAL_TABLET | Freq: Four times a day (QID) | ORAL | Status: DC | PRN

## 2013-07-29 NOTE — Patient Instructions (Signed)
F/U 6 months for physical- come fasting Norco given for pain

## 2013-07-31 NOTE — Progress Notes (Signed)
  Subjective:    Patient ID: Daniel Howard, male    DOB: April 03, 1956, 57 y.o.   MRN: [id number]  HPI  Pt here to f/u chronic medical problems, had recent flare of crohns disease, seen by GI, no changes made, due for last this fall. Tries ultram during flares but often does not help and has to go to ER for meds. Had boil under left axilla 1 week ago, but resolved without any intervention  Review of Systems  GEN- denies fatigue, fever, weight loss,weakness, recent illness HEENT- denies eye drainage, change in vision, nasal discharge, CVS- denies chest pain, palpitations RESP- denies SOB, cough, wheeze ABD- denies N/V, change in stools, abd pain GU- denies dysuria, hematuria, dribbling, incontinence MSK- denies joint pain, muscle aches, injury Neuro- denies headache, dizziness, syncope, seizure activity      Objective:   Physical Exam GEN- NAD, alert and oriented x3 HEENT- PERRL, EOMI, non injected sclera, pink conjunctiva, MMM, oropharynx clear,  CVS- RRR, no murmur RESP-CTAB ABS-NABS,soft,NT,ND, Ostomy bag in place- /brown stool in bag EXT- No edema Pulses- Radial 2+ Skin- no rash, no boils in axilla       Assessment & Plan:

## 2013-07-31 NOTE — Assessment & Plan Note (Signed)
Reviewed gi note, no further surgical intervention can be done Pain control and riding out symptoms during flares and SBO is all that can be achieved Will give script for Hydrocodone to use instead of ultram

## 2013-08-10 ENCOUNTER — Telehealth: Payer: Self-pay

## 2013-08-10 NOTE — Telephone Encounter (Signed)
You will need to ask pharmacy for available generic equivalent

## 2013-08-10 NOTE — Telephone Encounter (Signed)
[facility]. Pt has already picked up rx.

## 2013-08-10 NOTE — Telephone Encounter (Signed)
Received fax from [place] is on manufacturer's back order. They are requesting different called in.

## 2013-10-18 ENCOUNTER — Other Ambulatory Visit: Payer: Self-pay

## 2013-10-18 DIAGNOSIS — K50913 Crohn's disease, unspecified, with fistula: Secondary | ICD-10-CM

## 2013-10-25 ENCOUNTER — Telehealth: Payer: Self-pay

## 2013-10-25 NOTE — Telephone Encounter (Signed)
Pharmacy called for refill on pts Imuran. Spoke with AS- ok to refill for 1 month until pt comes back for ov. Pharmacist is aware. Spoke with SS and pt is on her recall list and she will make sure to get him in this month for ov.

## 2013-10-25 NOTE — Telephone Encounter (Signed)
Daniel Howard checked her recall list. Pt is on recall for a cbc in November (which has already been mailed to the pt) and on recall for ov in January- per RMR ov note. Do you want to send in rx for Imuran to last until pt returns in January?

## 2013-10-26 NOTE — Telephone Encounter (Signed)
Called and left message at pharmacy that it was ok to refill Imuran until January.

## 2013-10-26 NOTE — Telephone Encounter (Signed)
Yes, that's good.

## 2013-10-31 DIAGNOSIS — K5090 Crohn's disease, unspecified, without complications: Secondary | ICD-10-CM | POA: Diagnosis not present

## 2013-11-01 LAB — CBC WITH DIFFERENTIAL/PLATELET
Lymphocytes Relative: 27 % (ref 12–46)
Lymphs Abs: 1.3 10*3/uL (ref 0.7–4.0)
Neutro Abs: 2.8 10*3/uL (ref 1.7–7.7)
Neutrophils Relative %: 58 % (ref 43–77)
Platelets: 304 10*3/uL (ref 150–400)
RBC: 3.67 MIL/uL — ABNORMAL LOW (ref 4.22–5.81)
WBC: 4.8 10*3/uL (ref 4.0–10.5)

## 2013-11-02 ENCOUNTER — Other Ambulatory Visit: Payer: Self-pay | Admitting: Internal Medicine

## 2013-11-02 DIAGNOSIS — D649 Anemia, unspecified: Secondary | ICD-10-CM

## 2013-11-02 DIAGNOSIS — K5090 Crohn's disease, unspecified, without complications: Secondary | ICD-10-CM

## 2013-11-03 ENCOUNTER — Telehealth: Payer: Self-pay | Admitting: *Deleted

## 2013-11-03 MED ORDER — HYDROCODONE-ACETAMINOPHEN 5-325 MG PO TABS: 1.0000 | ORAL_TABLET | Freq: Four times a day (QID) | ORAL | Status: DC | PRN

## 2013-11-03 NOTE — Telephone Encounter (Signed)
Ok to refill 

## 2013-11-03 NOTE — Telephone Encounter (Signed)
scrpt printed, ready for provider signature

## 2013-11-03 NOTE — Telephone Encounter (Signed)
Okay to refill? 

## 2013-12-23 ENCOUNTER — Other Ambulatory Visit: Payer: Self-pay | Admitting: Gastroenterology

## 2013-12-26 ENCOUNTER — Other Ambulatory Visit: Payer: Self-pay | Admitting: Gastroenterology

## 2014-01-03 ENCOUNTER — Telehealth: Payer: Self-pay

## 2014-01-03 DIAGNOSIS — K50819 Crohn's disease of both small and large intestine with unspecified complications: Secondary | ICD-10-CM

## 2014-01-03 NOTE — Telephone Encounter (Signed)
Received PA denial from pts insurance for Imuran. Do you want to do an appeal (provider call the insurance company or do a letter) or do you want to change medication?  I spoke with pts wife this am- pt is doing ok at this point but they are very carefully watching his diet to make sure he doesn't get sick.

## 2014-01-03 NOTE — Telephone Encounter (Signed)
What is going on? He has been on this medication for years.

## 2014-01-03 NOTE — Telephone Encounter (Signed)
Per the letter from the [place] is not " a medically accepted indication" for imuran. I have put the paperwork on your cart.

## 2014-01-04 DIAGNOSIS — K50819 Crohn's disease of both small and large intestine with unspecified complications: Secondary | ICD-10-CM | POA: Insufficient documentation

## 2014-01-04 NOTE — Telephone Encounter (Signed)
Can we try code 555.2. This is for small and large intestine crohn's with complication.

## 2014-01-06 NOTE — Telephone Encounter (Signed)
Talked with insurance company. They need an appeal letter written and faxed to them with the new code and if there are any alternatives for him to take.

## 2014-01-10 ENCOUNTER — Encounter: Payer: Self-pay | Admitting: Gastroenterology

## 2014-01-11 NOTE — Telephone Encounter (Signed)
Late entry-Appeal letter was faxed to insurance company yesterday 01/10/14

## 2014-01-12 ENCOUNTER — Other Ambulatory Visit: Payer: Self-pay

## 2014-01-12 DIAGNOSIS — K5090 Crohn's disease, unspecified, without complications: Secondary | ICD-10-CM

## 2014-01-12 DIAGNOSIS — D649 Anemia, unspecified: Secondary | ICD-10-CM

## 2014-01-19 NOTE — Telephone Encounter (Signed)
PA has been approved. Pt is aware and approval letter has been faxed to the pharmacy.

## 2014-01-24 DIAGNOSIS — K5090 Crohn's disease, unspecified, without complications: Secondary | ICD-10-CM | POA: Diagnosis not present

## 2014-01-24 DIAGNOSIS — D649 Anemia, unspecified: Secondary | ICD-10-CM | POA: Diagnosis not present

## 2014-01-25 ENCOUNTER — Ambulatory Visit: Payer: Medicare Other | Admitting: Gastroenterology

## 2014-01-25 ENCOUNTER — Encounter: Payer: Self-pay | Admitting: Gastroenterology

## 2014-01-25 ENCOUNTER — Ambulatory Visit (INDEPENDENT_AMBULATORY_CARE_PROVIDER_SITE_OTHER): Payer: Medicare Other | Admitting: Gastroenterology

## 2014-01-25 ENCOUNTER — Encounter (INDEPENDENT_AMBULATORY_CARE_PROVIDER_SITE_OTHER): Payer: Self-pay

## 2014-01-25 VITALS — BP 99/64 | HR 85 | Temp 97.6°F | Wt 143.0 lb

## 2014-01-25 DIAGNOSIS — K5080 Crohn's disease of both small and large intestine without complications: Secondary | ICD-10-CM

## 2014-01-25 DIAGNOSIS — K50819 Crohn's disease of both small and large intestine with unspecified complications: Secondary | ICD-10-CM

## 2014-01-25 LAB — CBC WITH DIFFERENTIAL/PLATELET
BASOS PCT: 2 % — AB (ref 0–1)
Basophils Absolute: 0.1 10*3/uL (ref 0.0–0.1)
EOS ABS: 0.3 10*3/uL (ref 0.0–0.7)
Eosinophils Relative: 6 % — ABNORMAL HIGH (ref 0–5)
HCT: 40 % (ref 39.0–52.0)
Hemoglobin: 13.2 g/dL (ref 13.0–17.0)
Lymphocytes Relative: 28 % (ref 12–46)
Lymphs Abs: 1.4 10*3/uL (ref 0.7–4.0)
MCH: 31.4 pg (ref 26.0–34.0)
MCHC: 33 g/dL (ref 30.0–36.0)
MCV: 95 fL (ref 78.0–100.0)
Monocytes Absolute: 0.4 10*3/uL (ref 0.1–1.0)
Monocytes Relative: 7 % (ref 3–12)
NEUTROS PCT: 57 % (ref 43–77)
Neutro Abs: 2.9 10*3/uL (ref 1.7–7.7)
PLATELETS: 330 10*3/uL (ref 150–400)
RBC: 4.21 MIL/uL — ABNORMAL LOW (ref 4.22–5.81)
RDW: 13.4 % (ref 11.5–15.5)
WBC: 5 10*3/uL (ref 4.0–10.5)

## 2014-01-25 NOTE — Progress Notes (Signed)
cc'd to pcp 

## 2014-01-25 NOTE — Patient Instructions (Signed)
1. Please have your blood work done in 3 months. We will send you a letter to remind you. 2. Office visit in 6 months with Dr. Gala Romney.

## 2014-01-25 NOTE — Assessment & Plan Note (Addendum)
Clinically he has been doing well. Recent CBC improved but he had been off Imuran for about 3 weeks at that point. We'll recheck his labs in 3 months including iron and B12 levels given multiple abdominal surgeries in the past. We'll have him come back and see [place] in 6 months.  Chronic iron therapy. Based on upcoming labs, consider decreasing dose.

## 2014-01-25 NOTE — Progress Notes (Signed)
   Primary Care Physician: Vic Blackbird, MD  Primary Gastroenterologist:  Garfield Cornea, MD   Chief Complaint  Patient presents with  . Follow-up    HPI: Daniel Howard is a 58 y.o. male here for followup of complicated fistulizing Crohn's disease. He was last seen in July 2014. Recently his insurance coming denied covering Imuran but we were able to get this approved by writing an appeals letter. He was off the medications for about 3 weeks. He restarted them yesterday. He has a history of multiple surgeries, multiple abdominal hernias. He had another CBC done yesterday with improvement of his hemoglobin up to 13.2. On iron pills twice daily.  Overall has been doing well. Bowel movements are unchanged. Ostomy output unchanged. Denies melena or rectal bleeding. Denies abdominal pain. No vomiting. Appetite good. No weight loss. No heartburn.  Current Outpatient Prescriptions  Medication Sig Dispense Refill  . azaTHIOprine (IMURAN) 50 MG tablet TAKE 1 TABLET IN MORNING AND 1/2 IN EVENING  45 tablet  3  . Calcium Carbonate-Vit D-Min (CALCIUM 1200 PO) Take 1,200 mg by mouth daily.       . ferrous gluconate (FERGON) 325 MG tablet Take 325 mg by mouth daily with breakfast.        . Multiple Vitamins-Minerals (MULTIVITAMINS THER. W/MINERALS) TABS Take 1 tablet by mouth daily.         No current facility-administered medications for this visit.    Allergies as of 01/25/2014 - Review Complete 01/25/2014  Allergen Reaction Noted  . Penicillins Hives 01/25/2007    ROS:  General: Negative for anorexia, weight loss, fever, chills, fatigue, weakness. ENT: Negative for hoarseness, difficulty swallowing , nasal congestion. CV: Negative for chest pain, angina, palpitations, dyspnea on exertion, peripheral edema.  Respiratory: Negative for dyspnea at rest, dyspnea on exertion, cough, sputum, wheezing.  GI: See history of present illness. GU:  Negative for dysuria, hematuria, urinary  incontinence, urinary frequency, nocturnal urination.  Endo: Negative for unusual weight change.    Physical Examination:   BP 99/64  Pulse 85  Temp(Src) 97.6 F (36.4 C) (Oral)  Wt 143 lb (64.864 kg)  General: Well-nourished, well-developed in no acute distress.  Eyes: No icterus. Mouth: Oropharyngeal mucosa moist and pink , no lesions erythema or exudate. Lungs: Clear to auscultation bilaterally.  Heart: Regular rate and rhythm, no murmurs rubs or gallops.  Abdomen: Bowel sounds are normal, nontender, nondistended, no hepatosplenomegaly or masses, no abdominal bruits, no rebound or guarding.  Ostomy noted in the right upper quadrant, looks healthy. Extensive keloid scarring with multiple easily reducible ventral hernias noted. Extremities: No lower extremity edema. No clubbing or deformities. Neuro: Alert and oriented x 4   Skin: Warm and dry, no jaundice.   Psych: Alert and cooperative, normal mood and affect.  Labs:  Lab Results  Component Value Date   WBC 5.0 01/24/2014   HGB 13.2 01/24/2014   HCT 40.0 01/24/2014   MCV 95.0 01/24/2014   PLT 330 01/24/2014   Lab Results  Component Value Date   ALT 8 05/23/2013   AST 22 05/23/2013   ALKPHOS 61 05/23/2013   BILITOT 0.3 05/23/2013    Imaging Studies: No results found.

## 2014-01-30 ENCOUNTER — Encounter: Payer: Medicare Other | Admitting: Family Medicine

## 2014-02-14 ENCOUNTER — Encounter: Payer: Medicare Other | Admitting: Family Medicine

## 2014-02-22 ENCOUNTER — Ambulatory Visit (INDEPENDENT_AMBULATORY_CARE_PROVIDER_SITE_OTHER): Payer: Medicare Other | Admitting: Family Medicine

## 2014-02-22 ENCOUNTER — Encounter: Payer: Self-pay | Admitting: Family Medicine

## 2014-02-22 VITALS — BP 118/62 | HR 76 | Temp 98.3°F | Resp 18 | Ht 64.0 in | Wt 144.0 lb

## 2014-02-22 DIAGNOSIS — D649 Anemia, unspecified: Secondary | ICD-10-CM | POA: Diagnosis not present

## 2014-02-22 DIAGNOSIS — K5090 Crohn's disease, unspecified, without complications: Secondary | ICD-10-CM

## 2014-02-22 MED ORDER — HYDROCODONE-ACETAMINOPHEN 5-325 MG PO TABS: 1.0000 | ORAL_TABLET | Freq: Four times a day (QID) | ORAL | Status: DC | PRN

## 2014-02-22 NOTE — Patient Instructions (Signed)
Pain medication refilled F/U 6 months

## 2014-02-22 NOTE — Assessment & Plan Note (Signed)
Hemoglobin stable continue daily iron

## 2014-02-22 NOTE — Assessment & Plan Note (Addendum)
Review gastroenterology note. I refilled his hydrocodone to have when he has flares of abdominal pain. No changes in his therapy  He is maintaining his weight and nutritional status

## 2014-02-22 NOTE — Progress Notes (Signed)
Patient ID: Daniel Howard, male   DOB: 1956-06-11, 58 y.o.   MRN: [id number]   Subjective:    Patient ID: Daniel Howard, male    DOB: 06-Apr-1956, 58 y.o.   MRN: [id number]  Patient presents for Check-up  patient here to followup Crohn's disease and anemia. He was actually seen by his gastroenterologist recently and had labs done. His anemia has been stable. He is back on his Imuran for his Crohn's disease. He does request a refill of his pain medication to have when he has flares as we have advised him not to use anti-inflammatories with his history. He has no specific concerns today and is otherwise doing well. His appetite is good.    Review Of Systems:  GEN- denies fatigue, fever, weight loss,weakness, recent illness HEENT- denies eye drainage, change in vision, nasal discharge, CVS- denies chest pain, palpitations RESP- denies SOB, cough, wheeze ABD- denies N/V, change in stools, abd pain GU- denies dysuria, hematuria, dribbling, incontinence Neuro- denies headache, dizziness, syncope, seizure activity       Objective:    BP 118/62  Pulse 76  Temp(Src) 98.3 F (36.8 C)  Resp 18  Ht 5' 4"  (1.626 m)  Wt 144 lb (65.318 kg)  BMI 24.71 kg/m2  GEN- NAD, alert and oriented x3 HEENT- PERRL, EOMI, non injected sclera, pink conjunctiva, MMM, oropharynx clear,  CVS- RRR, no murmur RESP-CTAB ABS-NABS,soft,NT,ND, Ostomy bag in place EXT- No edema Pulses- Radial 2+       Assessment & Plan:      Problem List Items Addressed This Visit   None      Note: This dictation was prepared with Dragon dictation along with smaller phrase technology. Any transcriptional errors that result from this process are unintentional.

## 2014-03-14 ENCOUNTER — Other Ambulatory Visit: Payer: Self-pay

## 2014-03-14 DIAGNOSIS — K5090 Crohn's disease, unspecified, without complications: Secondary | ICD-10-CM

## 2014-03-14 DIAGNOSIS — D649 Anemia, unspecified: Secondary | ICD-10-CM

## 2014-03-23 ENCOUNTER — Encounter: Payer: Self-pay | Admitting: Internal Medicine

## 2014-03-28 ENCOUNTER — Other Ambulatory Visit: Payer: Self-pay | Admitting: Gastroenterology

## 2014-03-28 DIAGNOSIS — K5090 Crohn's disease, unspecified, without complications: Secondary | ICD-10-CM | POA: Diagnosis not present

## 2014-03-28 DIAGNOSIS — D649 Anemia, unspecified: Secondary | ICD-10-CM | POA: Diagnosis not present

## 2014-03-28 LAB — CBC WITH DIFFERENTIAL/PLATELET
BASOS ABS: 0.1 10*3/uL (ref 0.0–0.1)
Basophils Relative: 1 % (ref 0–1)
EOS PCT: 5 % (ref 0–5)
Eosinophils Absolute: 0.3 10*3/uL (ref 0.0–0.7)
HCT: 37.6 % — ABNORMAL LOW (ref 39.0–52.0)
Hemoglobin: 13.4 g/dL (ref 13.0–17.0)
Lymphocytes Relative: 27 % (ref 12–46)
Lymphs Abs: 1.7 10*3/uL (ref 0.7–4.0)
MCH: 32.2 pg (ref 26.0–34.0)
MCHC: 35.6 g/dL (ref 30.0–36.0)
MCV: 90.4 fL (ref 78.0–100.0)
Monocytes Absolute: 0.5 10*3/uL (ref 0.1–1.0)
Monocytes Relative: 8 % (ref 3–12)
NEUTROS ABS: 3.8 10*3/uL (ref 1.7–7.7)
Neutrophils Relative %: 59 % (ref 43–77)
PLATELETS: 363 10*3/uL (ref 150–400)
RBC: 4.16 MIL/uL — ABNORMAL LOW (ref 4.22–5.81)
RDW: 13.3 % (ref 11.5–15.5)
WBC: 6.4 10*3/uL (ref 4.0–10.5)

## 2014-03-28 LAB — IRON: IRON: 65 ug/dL (ref 42–165)

## 2014-03-28 LAB — FERRITIN: FERRITIN: 222 ng/mL (ref 22–322)

## 2014-03-29 LAB — VITAMIN B12: VITAMIN B 12: 641 pg/mL (ref 211–911)

## 2014-03-30 ENCOUNTER — Other Ambulatory Visit: Payer: Self-pay | Admitting: Gastroenterology

## 2014-03-30 DIAGNOSIS — K50819 Crohn's disease of both small and large intestine with unspecified complications: Secondary | ICD-10-CM

## 2014-04-21 ENCOUNTER — Ambulatory Visit: Payer: Medicare Other | Admitting: Internal Medicine

## 2014-04-28 ENCOUNTER — Ambulatory Visit: Payer: Medicare Other | Admitting: Internal Medicine

## 2014-05-04 ENCOUNTER — Encounter: Payer: Self-pay | Admitting: Internal Medicine

## 2014-05-08 ENCOUNTER — Telehealth: Payer: Self-pay | Admitting: Internal Medicine

## 2014-05-08 NOTE — Telephone Encounter (Signed)
Pt is to have CBC, IRON, FERRITIN, B12 in 3 months

## 2014-05-08 NOTE — Telephone Encounter (Signed)
Checked pt records. He had this blood work done in April 2015 and is not due for any additional bloodwork until July 2015. Routing to [facility] to verify.

## 2014-05-09 NOTE — Telephone Encounter (Signed)
July 2015.

## 2014-05-19 ENCOUNTER — Other Ambulatory Visit: Payer: Self-pay | Admitting: Gastroenterology

## 2014-05-23 ENCOUNTER — Other Ambulatory Visit: Payer: Self-pay

## 2014-05-23 DIAGNOSIS — K50819 Crohn's disease of both small and large intestine with unspecified complications: Secondary | ICD-10-CM

## 2014-06-02 ENCOUNTER — Ambulatory Visit: Payer: Medicare Other | Admitting: Internal Medicine

## 2014-06-06 ENCOUNTER — Encounter (INDEPENDENT_AMBULATORY_CARE_PROVIDER_SITE_OTHER): Payer: Self-pay

## 2014-06-06 ENCOUNTER — Other Ambulatory Visit: Payer: Self-pay

## 2014-06-06 ENCOUNTER — Encounter: Payer: Self-pay | Admitting: Internal Medicine

## 2014-06-06 ENCOUNTER — Ambulatory Visit (INDEPENDENT_AMBULATORY_CARE_PROVIDER_SITE_OTHER): Payer: Medicare Other | Admitting: Internal Medicine

## 2014-06-06 VITALS — BP 90/65 | HR 67 | Temp 98.1°F | Resp 18 | Ht 64.5 in | Wt 131.0 lb

## 2014-06-06 DIAGNOSIS — R634 Abnormal weight loss: Secondary | ICD-10-CM

## 2014-06-06 DIAGNOSIS — K5090 Crohn's disease, unspecified, without complications: Secondary | ICD-10-CM

## 2014-06-06 LAB — CBC WITH DIFFERENTIAL/PLATELET
Basophils Absolute: 0.1 10*3/uL (ref 0.0–0.1)
Basophils Relative: 1 % (ref 0–1)
EOS ABS: 0.2 10*3/uL (ref 0.0–0.7)
Eosinophils Relative: 3 % (ref 0–5)
HCT: 37.2 % — ABNORMAL LOW (ref 39.0–52.0)
Hemoglobin: 12.5 g/dL — ABNORMAL LOW (ref 13.0–17.0)
LYMPHS ABS: 1.5 10*3/uL (ref 0.7–4.0)
Lymphocytes Relative: 26 % (ref 12–46)
MCH: 32.1 pg (ref 26.0–34.0)
MCHC: 33.6 g/dL (ref 30.0–36.0)
MCV: 95.4 fL (ref 78.0–100.0)
Monocytes Absolute: 0.4 10*3/uL (ref 0.1–1.0)
Monocytes Relative: 7 % (ref 3–12)
NEUTROS ABS: 3.7 10*3/uL (ref 1.7–7.7)
NEUTROS PCT: 63 % (ref 43–77)
Platelets: 364 10*3/uL (ref 150–400)
RBC: 3.9 MIL/uL — AB (ref 4.22–5.81)
RDW: 13.7 % (ref 11.5–15.5)
WBC: 5.9 10*3/uL (ref 4.0–10.5)

## 2014-06-06 NOTE — Patient Instructions (Signed)
Will review CT to assess remaining colon; will need a screening colonoscopy  Our scales have you down about 12 pounds - Have Dr. [name] check your BP and weigh you the near future  CBC today.  Further recommendations to follow

## 2014-06-06 NOTE — Progress Notes (Signed)
Primary Care Physician:  Vic Blackbird, MD Primary Gastroenterologist:  Dr. Gala Romney  Pre-Procedure History & Physical: HPI:  Daniel Howard is a 58 y.o. male here for routine followup of complicated, fistulixing Crohn's disease. He is at his baseline.Empties his colostomy 3-4 times daily. No unusual abdominal pain or nausea or vomiting. Weight is down 12 pounds. Hasn't passed any blood through his colostomy. Hasn't really any nausea or vomiting. Iron studies a good previously B12 level also normal. Blood pressure running a little on the low side today but has no symptoms. I reviewed his surgical history he's had extensive GI abdominal surgery related to Crohn's disease. He appears to be overdue for colorectal screening of his residual large bowel. His current regimen includes Imuran.  Past Medical History  Diagnosis Date  . Anxiety   . Low back pain   . 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 Abnormal finding of biliary tract     MRCP shows pancreatic/biliary tract dilation. EUS 2010 confirmed dilation but no chronic pancreaitis or mass. Vascular ectasia crimpoing distal CBD.   [name] Spigelian hernia     bilateral  . Duodenal ulcer 2010    nsaids  . Peristomal hernia   . Small bowel obstruction     Past Surgical History  Procedure Laterality Date  . Colostomy  1992    transverse loop colostomy secondary to a stricture  . Hemicoloectomy w/ anastomosis  1993    R- Dr.DeMason   . Hernia repair  1996    incarcerated periostial hernia  . Esophagogastroduodenoscopy  05/2009    SLF: multiple antral erosions, large ulcer at ansatomosis (postsurgical changes at duodenal bulb and second portion of duodenum) BX c/x NSAIDS.  [name] Flexible sigmoidoscopy  1988    Dr. Laural Golden- suggested rohn's disease but the biopsies were not collaborative.   [name] Appendectomy  1992  . Eus  10/04/2009    Dr. Estill Bakes dilated CBD and main pancreatic duct.  No pancreatic    Prior to Admission medications   Medication Sig Start Date End Date Taking? Authorizing Provider  azaTHIOprine (IMURAN) 50 MG tablet TAKE 1 TABLET IN MORNING AND 1/2 IN EVENING 05/19/14  Yes Mahala Menghini, PA-C  Calcium Carbonate-Vit D-Min (CALCIUM 1200 PO) Take 1,200 mg by mouth daily.    Yes Historical Provider, MD  ferrous gluconate (FERGON) 325 MG tablet Take 325 mg by mouth daily with breakfast.     Yes Historical Provider, MD  HYDROcodone-acetaminophen (NORCO) 5-325 MG per tablet Take 1 tablet by mouth every 6 (six) hours as needed for moderate pain. 02/22/14  Yes Alycia Rossetti, MD  Multiple Vitamins-Minerals (MULTIVITAMINS THER. W/MINERALS) TABS Take 1 tablet by mouth daily.     Yes Historical Provider, MD    Allergies as of 06/06/2014 - Review Complete 06/06/2014  Allergen Reaction Noted  . Penicillins Hives 01/25/2007    Family History  Problem Relation Age of Onset  . Cancer Father     prostate   . Prostate cancer Father   . Hypertension Sister   . Cancer Sister   . Depression Sister   . Breast cancer Sister   . COPD Sister   . Aneurysm Brother     deceased, brain aneurysm    History   Social History  . Marital Status: Married    Spouse Name: [name]  Number of Children: 57  . Years of Education: [name]   Occupational History  . disabled    Social History Main Topics  . Smoking status: Former Smoker -- 0.50 packs/day for 2 years    Types: Cigarettes    Quit date: 12/21/2009  . Smokeless tobacco: Not on file  . Alcohol Use: No     Comment: Former drinker  . Drug Use: No  . Sexual Activity: Yes    Partners: Female   Other Topics Concern  . Not on file   Social History Narrative  . No narrative on file    Review of Systems: See HPI, otherwise negative ROS  Physical Exam: BP 90/65  Pulse 67  Temp(Src) 98.1 F (36.7 C) (Oral)   Resp 18  Ht 5' 4.5" (1.638 m)  Wt 131 lb (59.421 kg)  BMI 22.15 kg/m2 General:   Alert,  Well-developed, well-nourished, pleasant and cooperative in NAD. Accompanied by his wife Skin:  Intact without significant lesions or rashes. Eyes:  Sclera clear, no icterus.   Conjunctiva pink. Ears:  Normal auditory acuity. Nose:  No deformity, discharge,  or lesions. Mouth:  No deformity or lesions. Neck:  Supple; no masses or thyromegaly. No significant cervical adenopathy. Lungs:  Clear throughout to auscultation.   No wheezes, crackles, or rhonchi. No acute distress. Heart:  Regular rate and rhythm; no murmurs, clicks, rubs,  or gallops. Abdomen: Nondistended. Colostomy right lower quadrant. Multiple keloid scars with a chronic nominal wall defect noted. Abdomen is entirely soft and nontender. Pulses:  Normal pulses noted. Extremities:  Without clubbing or edema.  Impression:   History of complicated fistulizing  Crohn's disease doing fairly well. His blood pressure running a little low side today. We do have him down 12 pounds compared to prior weight. This is of uncertain significance. No history of chronic pancreatitis although he has chronic dilation of the pancreatic duct. Pancreas looked okay on prior endoscopic ultrasound. Clinically, sounds like he is doing well otherwise.  It's been some time since he had his residual colon image. He'll still been running for colorectal cancer screening examinations. He's had significant surgical alteration of his native anatomy. Would like to look back at the prior CT it was done before making recommendations about a screening colonoscopy.  He needs a CBC today as well.  Recommendations:  Will review CT to assess remaining colon; will need a screening colonoscopy  Our scales have you down about 12 pounds - Have Dr. [name] check your BP and weigh you in the near future  CBC today.  Further recommendations to follow    Notice: This dictation was  prepared with Dragon dictation along with smaller phrase technology. Any transcriptional errors that result from this process are unintentional and may not be corrected upon review.

## 2014-06-09 ENCOUNTER — Ambulatory Visit (INDEPENDENT_AMBULATORY_CARE_PROVIDER_SITE_OTHER): Payer: Medicare Other | Admitting: Family Medicine

## 2014-06-09 ENCOUNTER — Encounter: Payer: Self-pay | Admitting: Family Medicine

## 2014-06-09 VITALS — BP 100/62 | HR 82 | Temp 97.8°F | Resp 14 | Ht 66.0 in | Wt 131.0 lb

## 2014-06-09 DIAGNOSIS — I959 Hypotension, unspecified: Secondary | ICD-10-CM | POA: Diagnosis not present

## 2014-06-09 DIAGNOSIS — Z125 Encounter for screening for malignant neoplasm of prostate: Secondary | ICD-10-CM | POA: Diagnosis not present

## 2014-06-09 DIAGNOSIS — K5090 Crohn's disease, unspecified, without complications: Secondary | ICD-10-CM

## 2014-06-09 DIAGNOSIS — R634 Abnormal weight loss: Secondary | ICD-10-CM

## 2014-06-09 LAB — COMPREHENSIVE METABOLIC PANEL
ALT: <8 U/L (ref 0–53)
AST: 13 U/L (ref 0–37)
Albumin: 3.8 g/dL (ref 3.5–5.2)
Alkaline Phosphatase: 52 U/L (ref 39–117)
BILIRUBIN TOTAL: 0.7 mg/dL (ref 0.2–1.2)
BUN: 15 mg/dL (ref 6–23)
CO2: 29 meq/L (ref 19–32)
Calcium: 9.3 mg/dL (ref 8.4–10.5)
Chloride: 100 mEq/L (ref 96–112)
Creat: 1.21 mg/dL (ref 0.50–1.35)
Glucose, Bld: 81 mg/dL (ref 70–99)
POTASSIUM: 4.7 meq/L (ref 3.5–5.3)
Sodium: 138 mEq/L (ref 135–145)
Total Protein: 7.6 g/dL (ref 6.0–8.3)

## 2014-06-09 LAB — TSH: TSH: 1.758 u[IU]/mL (ref 0.350–4.500)

## 2014-06-09 MED ORDER — MIRTAZAPINE 15 MG PO TABS: 15.0000 mg | ORAL_TABLET | Freq: Every day | ORAL | Status: DC

## 2014-06-09 MED ORDER — HYDROCODONE-ACETAMINOPHEN 5-325 MG PO TABS: 1.0000 | ORAL_TABLET | Freq: Four times a day (QID) | ORAL | Status: DC | PRN

## 2014-06-09 NOTE — Assessment & Plan Note (Signed)
Reviewed gastroenterology's note. He will continue on his current medication for Crohn's

## 2014-06-09 NOTE — Assessment & Plan Note (Addendum)
His weight loss is concerning. He has had decreased appetite and has not been eating on a regular basis which are sure is contributing also with his Crohn's disease and abdominal pain he's had some change in output from his back on occasion. 2 metabolic panel as well as TSH in another PSA due to his age. I agree with CT scan of abdomen pelvis. He is unable to take supplements like Ensure because of his Crohn's disease this makes his crit been worse in his output worse. I will start him on Remeron 15 mg at bedtime he will followup in 4 weeks

## 2014-06-09 NOTE — Progress Notes (Signed)
Patient ID: Daniel Howard, male   DOB: July 07, 1956, 58 y.o.   MRN: [id number]   Subjective:    Patient ID: Daniel Howard, male    DOB: 1956-08-14, 58 y.o.   MRN: [id number]  Patient presents for F/U GI appointment and Weight Loss  patient here secondary to weight loss. Over the past 3 months he has lost 13 pounds. His appetite has decreased he states he was only eating typically 2 small meals a day. He's had some abdominal discomfort more than usual he was seen by gastroenterology on the 16th they were planning to do a CT scan of abdomen and pelvis to reevaluate his Crohn's disease. Unfortunately because of his Crohn's and his colostomy bag he has multiple abdominal surgeries and a lot of scar tissue make it difficult to examine him. He denies any chest pain states occasionally he gets short of breath but this is very rare. He denies any fever or recent illness. Denies any nausea vomiting. Denies any new joint pain. He is a nonsmoker    Review Of Systems:  GEN- denies fatigue, fever, +weight loss,weakness, recent illness HEENT- denies eye drainage, change in vision, nasal discharge, CVS- denies chest pain, palpitations RESP- denies SOB, cough, wheeze ABD- denies N/V, change in stools,+ abd pain GU- denies dysuria, hematuria, dribbling, incontinence MSK- denies joint pain, muscle aches, injury Neuro- denies headache, dizziness, syncope, seizure activity       Objective:    BP 100/62  Pulse 82  Temp(Src) 97.8 F (36.6 C) (Oral)  Resp 14  Ht 5' 6"  (1.676 m)  Wt 131 lb (59.421 kg)  BMI 21.15 kg/m2 GEN- NAD, alert and oriented x3 HEENT- PERRL, EOMI, non injected sclera, pink conjunctiva, MMM, oropharynx clear,  Neck- supple, no LAD CVS- RRR, no murmur RESP-CTAB ABS-NABS,soft,NT,ND, Ostomy bag in place, no hepatomegaly EXT- No edema Pulses- Radial 2+        Assessment & Plan:      Problem List Items Addressed This Visit   Loss of weight   Relevant Orders   Comprehensive metabolic panel      TSH   CROHN'S DISEASE - Primary    Other Visit Diagnoses   Prostate cancer screening        Relevant Orders       PSA, Medicare       Note: This dictation was prepared with Dragon dictation along with smaller phrase technology. Any transcriptional errors that result from this process are unintentional.

## 2014-06-09 NOTE — Assessment & Plan Note (Signed)
His blood pressure is low normal tensive typically. At the gastroenterologist office his blood pressure was a bit on the hypertensive side with a systolic of 90. I've advised him to increase his fluid intake as well he can also add electrolytes with Gatorade

## 2014-06-09 NOTE — Patient Instructions (Signed)
Start remeron at bedtime for appetite Eat more protein We will call with labs  I agree with CT scan F/U 4 weeks

## 2014-06-10 LAB — PSA, MEDICARE: PSA: 1.9 ng/mL (ref <=4.00)

## 2014-06-16 ENCOUNTER — Other Ambulatory Visit: Payer: Self-pay | Admitting: Internal Medicine

## 2014-06-16 DIAGNOSIS — K50819 Crohn's disease of both small and large intestine with unspecified complications: Secondary | ICD-10-CM

## 2014-06-27 ENCOUNTER — Telehealth: Payer: Self-pay | Admitting: *Deleted

## 2014-06-27 NOTE — Telephone Encounter (Signed)
Male called for pt about a paper. Male was requesting to speak with Almyra Free. Please advise [phone]

## 2014-06-27 NOTE — Telephone Encounter (Signed)
Paperwork is done and at the front desk for pt to pick up.  pts wife is aware.

## 2014-07-11 ENCOUNTER — Telehealth: Payer: Self-pay | Admitting: *Deleted

## 2014-07-11 MED ORDER — HYDROCODONE-ACETAMINOPHEN 5-325 MG PO TABS: 1.0000 | ORAL_TABLET | Freq: Four times a day (QID) | ORAL | Status: DC | PRN

## 2014-07-11 NOTE — Telephone Encounter (Signed)
Patient in office to report that he requires refill on Hydrocodone.   Ok to refill??  Last office visit/ refill 06/09/2014.

## 2014-07-11 NOTE — Telephone Encounter (Signed)
okay

## 2014-07-11 NOTE — Telephone Encounter (Signed)
Prescription printed and patient made aware to come to office to pick up.

## 2014-07-26 ENCOUNTER — Encounter (HOSPITAL_COMMUNITY): Payer: Self-pay | Admitting: Emergency Medicine

## 2014-07-26 ENCOUNTER — Inpatient Hospital Stay (HOSPITAL_COMMUNITY)
Admission: EM | Admit: 2014-07-26 | Discharge: 2014-07-28 | DRG: 388 | Disposition: A | Discharge status: Home / Self Care | Payer: Medicare Other | Attending: Internal Medicine | Admitting: Internal Medicine

## 2014-07-26 ENCOUNTER — Emergency Department (HOSPITAL_COMMUNITY): Payer: Medicare Other

## 2014-07-26 DIAGNOSIS — Z87891 Personal history of nicotine dependence: Secondary | ICD-10-CM | POA: Diagnosis not present

## 2014-07-26 DIAGNOSIS — K269 Duodenal ulcer, unspecified as acute or chronic, without hemorrhage or perforation: Secondary | ICD-10-CM | POA: Diagnosis present

## 2014-07-26 DIAGNOSIS — E162 Hypoglycemia, unspecified: Secondary | ICD-10-CM | POA: Diagnosis present

## 2014-07-26 DIAGNOSIS — K5090 Crohn's disease, unspecified, without complications: Secondary | ICD-10-CM

## 2014-07-26 DIAGNOSIS — K56609 Unspecified intestinal obstruction, unspecified as to partial versus complete obstruction: Principal | ICD-10-CM | POA: Diagnosis present

## 2014-07-26 DIAGNOSIS — Z880 Allergy status to penicillin: Secondary | ICD-10-CM

## 2014-07-26 DIAGNOSIS — G8929 Other chronic pain: Secondary | ICD-10-CM | POA: Diagnosis present

## 2014-07-26 DIAGNOSIS — I959 Hypotension, unspecified: Secondary | ICD-10-CM

## 2014-07-26 DIAGNOSIS — D649 Anemia, unspecified: Secondary | ICD-10-CM | POA: Diagnosis present

## 2014-07-26 DIAGNOSIS — E871 Hypo-osmolality and hyponatremia: Secondary | ICD-10-CM | POA: Diagnosis not present

## 2014-07-26 DIAGNOSIS — K5080 Crohn's disease of both small and large intestine without complications: Secondary | ICD-10-CM | POA: Diagnosis present

## 2014-07-26 DIAGNOSIS — K439 Ventral hernia without obstruction or gangrene: Secondary | ICD-10-CM | POA: Diagnosis present

## 2014-07-26 DIAGNOSIS — E43 Unspecified severe protein-calorie malnutrition: Secondary | ICD-10-CM | POA: Diagnosis present

## 2014-07-26 DIAGNOSIS — R109 Unspecified abdominal pain: Secondary | ICD-10-CM | POA: Diagnosis not present

## 2014-07-26 DIAGNOSIS — Z932 Ileostomy status: Secondary | ICD-10-CM | POA: Diagnosis not present

## 2014-07-26 DIAGNOSIS — IMO0002 Reserved for concepts with insufficient information to code with codable children: Secondary | ICD-10-CM | POA: Diagnosis present

## 2014-07-26 DIAGNOSIS — F411 Generalized anxiety disorder: Secondary | ICD-10-CM | POA: Diagnosis present

## 2014-07-26 DIAGNOSIS — M545 Low back pain, unspecified: Secondary | ICD-10-CM | POA: Diagnosis present

## 2014-07-26 DIAGNOSIS — R634 Abnormal weight loss: Secondary | ICD-10-CM

## 2014-07-26 DIAGNOSIS — L0292 Furuncle, unspecified: Secondary | ICD-10-CM

## 2014-07-26 DIAGNOSIS — K50819 Crohn's disease of both small and large intestine with unspecified complications: Secondary | ICD-10-CM

## 2014-07-26 LAB — COMPREHENSIVE METABOLIC PANEL
ALBUMIN: 3 g/dL — AB (ref 3.5–5.2)
ALT: 5 U/L (ref 0–53)
AST: 14 U/L (ref 0–37)
Alkaline Phosphatase: 58 U/L (ref 39–117)
Anion gap: 16 — ABNORMAL HIGH (ref 5–15)
BUN: 14 mg/dL (ref 6–23)
CO2: 22 mEq/L (ref 19–32)
CREATININE: 1.14 mg/dL (ref 0.50–1.35)
Calcium: 9.1 mg/dL (ref 8.4–10.5)
Chloride: 100 mEq/L (ref 96–112)
GFR calc Af Amer: 81 mL/min — ABNORMAL LOW (ref >90)
GFR calc non Af Amer: 70 mL/min — ABNORMAL LOW (ref >90)
Glucose, Bld: 66 mg/dL — ABNORMAL LOW (ref 70–99)
Potassium: 4 mEq/L (ref 3.7–5.3)
SODIUM: 138 meq/L (ref 137–147)
TOTAL PROTEIN: 7.7 g/dL (ref 6.0–8.3)
Total Bilirubin: 0.7 mg/dL (ref 0.3–1.2)

## 2014-07-26 LAB — CREATININE, SERUM
CREATININE: 1.12 mg/dL (ref 0.50–1.35)
GFR calc Af Amer: 83 mL/min — ABNORMAL LOW (ref >90)
GFR calc non Af Amer: 72 mL/min — ABNORMAL LOW (ref >90)

## 2014-07-26 LAB — CBC
HCT: 40.6 % (ref 39.0–52.0)
HEMOGLOBIN: 13.9 g/dL (ref 13.0–17.0)
MCH: 32.8 pg (ref 26.0–34.0)
MCHC: 34.2 g/dL (ref 30.0–36.0)
MCV: 95.8 fL (ref 78.0–100.0)
Platelets: 391 10*3/uL (ref 150–400)
RBC: 4.24 MIL/uL (ref 4.22–5.81)
RDW: 13.5 % (ref 11.5–15.5)
WBC: 5.8 10*3/uL (ref 4.0–10.5)

## 2014-07-26 LAB — CBC WITH DIFFERENTIAL/PLATELET
BASOS ABS: 0 10*3/uL (ref 0.0–0.1)
BASOS PCT: 0 % (ref 0–1)
EOS ABS: 0 10*3/uL (ref 0.0–0.7)
EOS PCT: 0 % (ref 0–5)
HCT: 39.3 % (ref 39.0–52.0)
Hemoglobin: 13.4 g/dL (ref 13.0–17.0)
LYMPHS PCT: 12 % (ref 12–46)
Lymphs Abs: 0.9 10*3/uL (ref 0.7–4.0)
MCH: 32.7 pg (ref 26.0–34.0)
MCHC: 34.1 g/dL (ref 30.0–36.0)
MCV: 95.9 fL (ref 78.0–100.0)
Monocytes Absolute: 0.6 10*3/uL (ref 0.1–1.0)
Monocytes Relative: 9 % (ref 3–12)
Neutro Abs: 5.3 10*3/uL (ref 1.7–7.7)
Neutrophils Relative %: 79 % — ABNORMAL HIGH (ref 43–77)
PLATELETS: 338 10*3/uL (ref 150–400)
RBC: 4.1 MIL/uL — ABNORMAL LOW (ref 4.22–5.81)
RDW: 13.4 % (ref 11.5–15.5)
WBC: 6.9 10*3/uL (ref 4.0–10.5)

## 2014-07-26 LAB — CBG MONITORING, ED: Glucose-Capillary: 133 mg/dL — ABNORMAL HIGH (ref 70–99)

## 2014-07-26 LAB — TSH: TSH: 0.603 u[IU]/mL (ref 0.350–4.500)

## 2014-07-26 MED ORDER — SODIUM CHLORIDE 0.9 % IV SOLN: Freq: Once | INTRAVENOUS | Status: DC

## 2014-07-26 MED ORDER — SODIUM CHLORIDE 0.9 % IJ SOLN
INTRAMUSCULAR | Status: AC
  Filled 2014-07-26: qty 500

## 2014-07-26 MED ORDER — HYDROMORPHONE HCL PF 1 MG/ML IJ SOLN
0.5000 mg | Freq: Once | INTRAMUSCULAR | Status: AC
  Administered 2014-07-26: 0.5 mg via INTRAVENOUS
  Filled 2014-07-26: qty 1

## 2014-07-26 MED ORDER — DEXTROSE 50 % IV SOLN
INTRAVENOUS | Status: AC
  Filled 2014-07-26: qty 50

## 2014-07-26 MED ORDER — HEPARIN SODIUM (PORCINE) 5000 UNIT/ML IJ SOLN
5000.0000 [IU] | Freq: Three times a day (TID) | INTRAMUSCULAR | Status: DC
  Filled 2014-07-26 (×6): qty 1

## 2014-07-26 MED ORDER — DEXTROSE 50 % IV SOLN
25.0000 mL | Freq: Once | INTRAVENOUS | Status: AC
  Administered 2014-07-26: 25 mL via INTRAVENOUS

## 2014-07-26 MED ORDER — ONDANSETRON HCL 4 MG/2ML IJ SOLN: 4.0000 mg | Freq: Four times a day (QID) | INTRAMUSCULAR | Status: DC | PRN

## 2014-07-26 MED ORDER — GUAIFENESIN-DM 100-10 MG/5ML PO SYRP
5.0000 mL | ORAL_SOLUTION | ORAL | Status: DC | PRN
  Filled 2014-07-26: qty 5

## 2014-07-26 MED ORDER — HYDROMORPHONE HCL PF 1 MG/ML IJ SOLN: 0.5000 mg | Freq: Once | INTRAMUSCULAR | Status: DC

## 2014-07-26 MED ORDER — IOHEXOL 300 MG/ML  SOLN
100.0000 mL | Freq: Once | INTRAMUSCULAR | Status: AC | PRN
  Administered 2014-07-26: 100 mL via INTRAVENOUS

## 2014-07-26 MED ORDER — SODIUM CHLORIDE 0.9 % IV SOLN
Freq: Once | INTRAVENOUS | Status: AC
  Administered 2014-07-26: 16:00:00 via INTRAVENOUS

## 2014-07-26 MED ORDER — SODIUM CHLORIDE 0.9 % IV SOLN
Freq: Once | INTRAVENOUS | Status: AC
  Administered 2014-07-26: 150 mL/h via INTRAVENOUS

## 2014-07-26 MED ORDER — DEXTROSE 5 % AND 0.45 % NACL IV BOLUS: 75.0000 mL | INTRAVENOUS | Status: DC

## 2014-07-26 MED ORDER — LORAZEPAM 2 MG/ML IJ SOLN
0.5000 mg | Freq: Once | INTRAMUSCULAR | Status: AC
  Administered 2014-07-27: 0.5 mg via INTRAMUSCULAR
  Filled 2014-07-26: qty 1

## 2014-07-26 MED ORDER — IOHEXOL 300 MG/ML  SOLN
50.0000 mL | Freq: Once | INTRAMUSCULAR | Status: AC | PRN
  Administered 2014-07-26: 50 mL via ORAL

## 2014-07-26 MED ORDER — DEXTROSE-NACL 5-0.45 % IV SOLN
INTRAVENOUS | Status: DC
  Administered 2014-07-26 – 2014-07-27 (×4): via INTRAVENOUS

## 2014-07-26 MED ORDER — ONDANSETRON HCL 4 MG PO TABS: 4.0000 mg | ORAL_TABLET | Freq: Four times a day (QID) | ORAL | Status: DC | PRN

## 2014-07-26 MED ORDER — ONDANSETRON HCL 4 MG/2ML IJ SOLN
4.0000 mg | Freq: Once | INTRAMUSCULAR | Status: AC
  Administered 2014-07-26: 4 mg via INTRAVENOUS
  Filled 2014-07-26: qty 2

## 2014-07-26 MED ORDER — HYDROMORPHONE HCL PF 1 MG/ML IJ SOLN
0.5000 mg | INTRAMUSCULAR | Status: DC | PRN
  Administered 2014-07-26 – 2014-07-27 (×4): 0.5 mg via INTRAVENOUS
  Filled 2014-07-26 (×3): qty 1

## 2014-07-26 MED ORDER — DEXTROSE 50 % IV SOLN: 50.0000 mL | Freq: Once | INTRAVENOUS | Status: DC

## 2014-07-26 NOTE — Progress Notes (Signed)
  Pt admitted to the unit. Pt is stable, alert and oriented per baseline. Oriented to room, staff, and call bell. Educated to call for any assistance. Bed in lowest position, call bell within reach- will continue to monitor. 

## 2014-07-26 NOTE — ED Notes (Signed)
Pt refusing NG tube at this time. MD notified.

## 2014-07-26 NOTE — H&P (Addendum)
Patient Demographics  Daniel Howard, is a 59 y.o. male  MRN: [id number]   DOB - September 02, 1956  Admit Date - 07/26/2014  Outpatient Primary MD for the patient is Vic Blackbird, MD   With History of -  Past Medical History  Diagnosis Date  . Anxiety   . Low back pain   . 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 Abnormal finding of biliary tract     MRCP shows pancreatic/biliary tract dilation. EUS 2010 confirmed dilation but no chronic pancreaitis or mass. Vascular ectasia crimpoing distal CBD.   [name] Spigelian hernia     bilateral  . Duodenal ulcer 2010    nsaids  . Peristomal hernia   . Small bowel obstruction       Past Surgical History  Procedure Laterality Date  . Colostomy  1992    transverse loop colostomy secondary to a stricture  . Hemicoloectomy w/ anastomosis  1993    R- Dr.DeMason   . Hernia repair  1996    incarcerated periostial hernia  . Esophagogastroduodenoscopy  05/2009    SLF: multiple antral erosions, large ulcer at ansatomosis (postsurgical changes at duodenal bulb and second portion of duodenum) BX c/x NSAIDS.  [name] Flexible sigmoidoscopy  1988    Dr. Laural Golden- suggested rohn's disease but the biopsies were not collaborative.  [name] Appendectomy  1992  . Eus  10/04/2009    Dr. Estill Bakes dilated CBD and main pancreatic duct.  No pancreatic    in for   Chief Complaint  Patient presents with  . Abdominal Pain     HPI  Daniel Howard  is a 58 y.o. male, history of Crohn's disease requiring surgery and ileostomy over 10 years ago, multiple small bowel obstructions and ventral hernias requiring surgeries, chronic pain, anxiety, who  presents to the hospital with 2-3 day history of generalized abdominal pain, decreased stool output and gas output in his ileostomy bag, mild nausea but no emesis, came to the ER where CT scan was suggested for small bowel obstruction, case was discussed with patient's gastroenterologist who requested hospitalist service to admit the patient.  Patient denies any headache fever chills, no chest pain palpitations cough phlegm shortness of breath, generalized abdominal pain which is nonradiating as above, worse with taking oral intake better with bowel rest, no associated diarrhea, some nausea without emesis, ongoing for about 2 days, no blood or mucus in stool prior to that, no fever chills, no focal weakness.    Review of Systems    In addition to the HPI above,  No Fever-chills, No Headache, No changes with Vision or hearing, No problems swallowing food or Liquids, No Chest pain, Cough or Shortness of Breath, Positive Abdominal pain nausea but no emesis, no stool in the ostomy bag for one day No Blood in stool or  Urine, No dysuria, No new skin rashes or bruises, No new joints pains-aches,  No new weakness, tingling, numbness in any extremity, No recent weight gain or loss, No polyuria, polydypsia or polyphagia, No significant Mental Stressors.  A full 10 point Review of Systems was done, except as stated above, all other Review of Systems were negative.   Social History History  Substance Use Topics  . Smoking status: Former Smoker -- 0.50 packs/day for 2 years    Types: Cigarettes    Quit date: 12/21/2009  . Smokeless tobacco: Never Used  . Alcohol Use: No     Comment: Former drinker      Family History Family History  Problem Relation Age of Onset  . Cancer Father     prostate   . Prostate cancer Father   . Hypertension Sister   . Cancer Sister   . Depression Sister   . Breast cancer Sister   . COPD Sister   . Aneurysm Brother     deceased, brain aneurysm       Prior to Admission medications   Medication Sig Start Date End Date Taking? Authorizing Provider  azaTHIOprine (IMURAN) 50 MG tablet TAKE 1 TABLET IN MORNING AND 1/2 IN EVENING 05/19/14  Yes Mahala Menghini, PA-C  Calcium Carbonate-Vit D-Min (CALCIUM 1200 PO) Take 1,200 mg by mouth daily.    Yes Historical Provider, MD  ferrous gluconate (FERGON) 325 MG tablet Take 325 mg by mouth daily with breakfast.     Yes Historical Provider, MD  HYDROcodone-acetaminophen (NORCO) 5-325 MG per tablet Take 1 tablet by mouth every 6 (six) hours as needed for moderate pain. 07/11/14  Yes Alycia Rossetti, MD  mirtazapine (REMERON) 15 MG tablet Take 1 tablet (15 mg total) by mouth at bedtime. For appetite 06/09/14  Yes Alycia Rossetti, MD  Multiple Vitamins-Minerals (MULTIVITAMINS THER. W/MINERALS) TABS Take 1 tablet by mouth daily.     Yes Historical Provider, MD    Allergies  Allergen Reactions  . Penicillins Hives    Physical Exam  Vitals  Blood pressure 111/79, pulse 90, temperature 98.1 F (36.7 C), temperature source Oral, resp. rate 16, weight 58.968 kg (130 lb), SpO2 98.00%.   1. General middle-aged thin frail African American male lying in bed in NAD,  2. Normal affect and insight, Not Suicidal or Homicidal, Awake Alert, Oriented X 3.  3. No F.N deficits, ALL C.Nerves Intact, Strength 5/5 all 4 extremities, Sensation intact all 4 extremities, Plantars down going.  4. Ears and Eyes appear Normal, Conjunctivae clear, PERRLA. Moist Oral Mucosa.  5. Supple Neck, No JVD, No cervical lymphadenopathy appriciated, No Carotid Bruits.  6. Symmetrical Chest wall movement, Good air movement bilaterally, CTAB.  7. RRR, No Gallops, Rubs or Murmurs, No Parasternal Heave.  8. hypoactive Bowel Sounds, Abdomen Soft, has ileostomy bag with mild diffuse generalized tenderness, No organomegaly appriciated,No rebound -guarding or rigidity.  9.  No Cyanosis, Normal Skin Turgor, No Skin Rash or  Bruise.  10. Good muscle tone,  joints appear normal , no effusions, Normal ROM.  11. No Palpable Lymph Nodes in Neck or Axillae    Data Review  CBC  Recent Labs Lab 07/26/14 1255  WBC 6.9  HGB 13.4  HCT 39.3  PLT 338  MCV 95.9  MCH 32.7  MCHC 34.1  RDW 13.4  LYMPHSABS 0.9  MONOABS 0.6  EOSABS 0.0  BASOSABS 0.0   ------------------------------------------------------------------------------------------------------------------  Chemistries   Recent Labs Lab 07/26/14 1255  NA  138  K 4.0  CL 100  CO2 22  GLUCOSE 66*  BUN 14  CREATININE 1.14  CALCIUM 9.1  AST 14  ALT 5  ALKPHOS 58  BILITOT 0.7   ------------------------------------------------------------------------------------------------------------------ CrCl is unknown because both a height and weight (above a minimum accepted value) are required for this calculation. ------------------------------------------------------------------------------------------------------------------ No results found for this basename: TSH, T4TOTAL, FREET3, T3FREE, THYROIDAB,  in the last 72 hours   Coagulation profile No results found for this basename: INR, PROTIME,  in the last 168 hours ------------------------------------------------------------------------------------------------------------------- No results found for this basename: DDIMER,  in the last 72 hours -------------------------------------------------------------------------------------------------------------------  Cardiac Enzymes No results found for this basename: CK, CKMB, TROPONINI, MYOGLOBIN,  in the last 168 hours ------------------------------------------------------------------------------------------------------------------ No components found with this basename: POCBNP,    ---------------------------------------------------------------------------------------------------------------  Urinalysis    Component Value Date/Time   COLORURINE  YELLOW 11/17/2011 0916   APPEARANCEUR CLEAR 11/17/2011 0916   LABSPEC 1.030 11/17/2011 0916   PHURINE 5.5 11/17/2011 0916   GLUCOSEU NEGATIVE 11/17/2011 0916   HGBUR NEGATIVE 11/17/2011 0916   BILIRUBINUR NEGATIVE 11/17/2011 0916   KETONESUR NEGATIVE 11/17/2011 0916   PROTEINUR NEGATIVE 11/17/2011 0916   UROBILINOGEN 0.2 11/17/2011 0916   NITRITE NEGATIVE 11/17/2011 0916   LEUKOCYTESUR NEGATIVE 11/17/2011 0916    ----------------------------------------------------------------------------------------------------------------  Imaging results:   Ct Abdomen Pelvis W Contrast  07/26/2014   CLINICAL DATA:  Abdominal pain and vomiting. History of Crohn's disease.  EXAM: CT ABDOMEN AND PELVIS WITH CONTRAST  TECHNIQUE: Multidetector CT imaging of the abdomen and pelvis was performed using the standard protocol following bolus administration of intravenous contrast.  CONTRAST:  48m OMNIPAQUE IOHEXOL 300 MG/ML SOLN, 1076mOMNIPAQUE IOHEXOL 300 MG/ML SOLN  COMPARISON:  02/05/2012  FINDINGS: Lung bases are clear. No evidence for free air. Gallbladder appears to be surgically removed. Mild dilatation of the biliary system appears unchanged. No acute abnormality within the liver and the portal venous system is patent. There is a stable dense or hypervascular structure along the inferior spleen that measures 1 cm and likely an incidental finding such as a hemangioma. Stable dilatation of the main pancreatic duct measuring up to 5 mm. Incidentally, the patient has a retro aortic left renal vein. Normal appearance of the adrenal glands. No acute abnormality to the kidneys. Question small left renal cortical cysts.  There is surgical mesh material along the right lower abdominal wall. There is contrast within the ureters and urinary bladder. No gross abnormality to the prostate or bladder.  There appears to be an anastomosis near the rectum. There is decompressed bowel extending cephalad from the rectum. There are  dilated loops of bowel in the pelvis, mid abdomen and right abdomen. Majority of the dilated loops appear to represent small bowel but there is indeterminate dilated loop in the right lower quadrant which has been chronically dilated. This chronically dilated loop of bowel in the right lower quadrant is adjacent to the right lower quadrant ostomy. The ostomy does not appear to be distended.  The patient has a chronic left lateral abdominal hernia containing small bowel loops. This hernia sac contains loops of bowel which contain oral contrast. However, there are fluid-filled structures within this hernia sac which do not contain contrast but these may also represent small bowel. There appears to some soft tissue edema within this hernia sac. Difficult to exclude fluid within the left abdominal hernia.  Again noted are large mesenteric lymph nodes in the right lower quadrant which are similar to the  previous examinations. There is a small right lateral hernia sac best seen on the delayed images, sequence 7, image 311 containing a loop of bowel. No acute bone abnormality.  IMPRESSION: The patient's bowel anatomy is complex due to previous surgery. However, there is significant bowel distension, particularly in the right abdomen. Findings are concerning for a bowel obstruction. A transition point is not clearly identified but the obstruction appears to be distal. There is mild stranding in the right lower quadrant but this appears chronic.  Patient has a chronic left abdominal wall hernia containing loops of small bowel. Difficult to differentiate fluid-filled bowel from possible fluid collections within this hernia. Although there are some edematous changes within this left abdominal hernia, this does not appear to be causing obstruction.  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  Stable hypervascular or dense structure in the spleen.  These results were called by telephone at the time of interpretation  on 07/26/2014 at 3:54 pm to Dr. Milton Ferguson , who verbally acknowledged these results.   Electronically Signed   By: Markus Daft M.D.   On: 07/26/2014 16:10         Assessment & Plan   1. Small bowel obstruction in a patient with history of Crohn's disease, multiple ventral hernia surgeries, multiple small bowel obstructions and ileostomy- will be admitted to a MedSurg bed, n.p.o., IV fluids, supportive care, GI was consulted by ER, will answer general surgery also, for now no need of NG tube as he appears comfortable and has no emesis so far, if he starts to throw up we will place NG tube. Repeat abdominal x-ray in the morning along with basic labs. Doubt surgery is an option at this point due to his complex anatomy.   2. History of Crohn's disease. Supportive care for now. GI consulted.   3. Chronic pain. For now IV pain medications and anti-emetic medications IV will be utilized due to strict n.p.o. Status.   4. Borderline hypoglycemia. Gentle D5 drip and monitor. Check TSH cortisol. Likely due to poor oral intake.    Addendum. After admission patient and family requested that they would prefer to be transferred to [facility], arrangements will be made. Have informed [place] surgery team at [facility] about the transfer. They would prefer to be called again once patient arrives to [facility]. Patient currently refusing NG tube which was requested later by general surgeon on call here.     DVT Prophylaxis Heparin   AM Labs Ordered, also please review Full Orders  Family Communication: Admission, patients condition and plan of care including tests being ordered have been discussed with the patient and wife who indicate understanding and agree with the plan and Code Status.  Code Status Full  Likely DC to  Home  Condition GUARDED     Time spent in minutes : 35    Jeanni Allshouse K M.D on 07/26/2014 at 4:16 PM  Between 7am to 7pm - Pager - [phone]  After 7pm go to  [email or web address] - password [email or web address]  And look for the night coverage person covering me after hours  [facility] Office  [phone]   **Disclaimer: This note may have been dictated with voice recognition software. Similar sounding words can inadvertently be transcribed and this note may contain transcription errors which may not have been corrected upon publication of note.**

## 2014-07-26 NOTE — Progress Notes (Signed)
Patient states that he would like something for pain-NP notified

## 2014-07-26 NOTE — ED Provider Notes (Signed)
CSN: [id number]     Arrival date & time 07/26/14  1122 History  This chart was scribed for Daniel Diego, MD by Irene Pap, ED Scribe. This patient was seen in room APA19/APA19 and patient care was started at 11:55 AM.   Chief Complaint  Patient presents with  . Abdominal Pain   Patient is a 58 y.o. male presenting with abdominal pain. The history is provided by the patient. No language interpreter was used.  Abdominal Pain Pain location:  Generalized Duration:  1 day Timing:  Constant Progression:  Worsening Associated symptoms: vomiting   Associated symptoms: no chest pain, no cough, no diarrhea, no fatigue and no hematuria    HPI Comments: Daniel Howard is a 58 y.o. male with a history of Crohn's Disease who presents to the Emergency Department complaining of constant abdominal pain onset yesterday. He reports associated vomiting, decrease in appetite and a possible block to his colostomy bag. He states that he sees Dr. Buford Dresser for GI.   Past Medical History  Diagnosis Date  . Anxiety   . Low back pain   . 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 Abnormal finding of biliary tract     MRCP shows pancreatic/biliary tract dilation. EUS 2010 confirmed dilation but no chronic pancreaitis or mass. Vascular ectasia crimpoing distal CBD.   [name] Spigelian hernia     bilateral  . Duodenal ulcer 2010    nsaids  . Peristomal hernia   . Small bowel obstruction    Past Surgical History  Procedure Laterality Date  . Colostomy  1992    transverse loop colostomy secondary to a stricture  . Hemicoloectomy w/ anastomosis  1993    R- Dr.DeMason   . Hernia repair  1996    incarcerated periostial hernia  . Esophagogastroduodenoscopy  05/2009    SLF: multiple antral erosions, large ulcer at ansatomosis (postsurgical changes at duodenal  bulb and second portion of duodenum) BX c/x NSAIDS.  [name] Flexible sigmoidoscopy  1988    Dr. Laural Golden- suggested rohn's disease but the biopsies were not collaborative.  [name] Appendectomy  1992  . Eus  10/04/2009    Dr. Estill Bakes dilated CBD and main pancreatic duct.  No pancreatic   Family History  Problem Relation Age of Onset  . Cancer Father     prostate   . Prostate cancer Father   . Hypertension Sister   . Cancer Sister   . Depression Sister   . Breast cancer Sister   . COPD Sister   . Aneurysm Brother     deceased, brain aneurysm   History  Substance Use Topics  . Smoking status: Former Smoker -- 0.50 packs/day for 2 years    Types: Cigarettes    Quit date: 12/21/2009  . Smokeless tobacco: Never Used  . Alcohol Use: No     Comment: Former drinker    Review of Systems  Constitutional: Negative for appetite change and fatigue.  HENT: Negative for congestion, ear discharge and sinus pressure.   Eyes: Negative for discharge.  Respiratory: Negative for cough.   Cardiovascular: Negative for chest pain.  Gastrointestinal: Positive for vomiting and abdominal pain. Negative for diarrhea.  Genitourinary: Negative for frequency and hematuria.  Musculoskeletal: Negative for back pain.  Skin: Negative for rash.  Neurological: Negative for seizures and headaches.  Psychiatric/Behavioral: Negative for hallucinations.  Allergies  Penicillins  Home Medications   Prior to Admission medications   Medication Sig Start Date End Date Taking? Authorizing Provider  azaTHIOprine (IMURAN) 50 MG tablet TAKE 1 TABLET IN MORNING AND 1/2 IN EVENING 05/19/14   Mahala Menghini, PA-C  Calcium Carbonate-Vit D-Min (CALCIUM 1200 PO) Take 1,200 mg by mouth daily.     Historical Provider, MD  ferrous gluconate (FERGON) 325 MG tablet Take 325 mg by mouth daily with breakfast.      Historical Provider, MD  HYDROcodone-acetaminophen (NORCO) 5-325 MG per tablet Take 1 tablet by mouth every 6 (six)  hours as needed for moderate pain. 07/11/14   Alycia Rossetti, MD  mirtazapine (REMERON) 15 MG tablet Take 1 tablet (15 mg total) by mouth at bedtime. For appetite 06/09/14   Alycia Rossetti, MD  Multiple Vitamins-Minerals (MULTIVITAMINS THER. W/MINERALS) TABS Take 1 tablet by mouth daily.      Historical Provider, MD   BP 112/77  Pulse 84  Temp(Src) 98.1 F (36.7 C) (Oral)  Resp 16  Wt 130 lb (58.968 kg)  SpO2 100%  Physical Exam  Constitutional: He is oriented to person, place, and time. He appears well-developed.  HENT:  Head: Normocephalic.  Eyes: Conjunctivae and EOM are normal. No scleral icterus.  Neck: Neck supple. No thyromegaly present.  Cardiovascular: Normal rate and regular rhythm.  Exam reveals no gallop and no friction rub.   No murmur heard. Pulmonary/Chest: No stridor. He has no wheezes. He has no rales. He exhibits no tenderness.  Abdominal: He exhibits no distension. There is tenderness. There is no rebound.  Colostomy bag. Swelling in abdomen. Tenderness throughout abdomen.   Musculoskeletal: Normal range of motion. He exhibits no edema.  Lymphadenopathy:    He has no cervical adenopathy.  Neurological: He is oriented to person, place, and time. He exhibits normal muscle tone. Coordination normal.  Skin: No rash noted. No erythema.  Psychiatric: He has a normal mood and affect. His behavior is normal.    ED Course  Procedures (including critical care time) DIAGNOSTIC STUDIES: Oxygen Saturation is 100% on room air, normal by my interpretation.    COORDINATION OF CARE: 11:58 AM-Discussed treatment plan which includes pain medication and CT scan with pt at bedside and pt agreed to plan.   Labs Review Labs Reviewed  CBC WITH DIFFERENTIAL - Abnormal; Notable for the following:    RBC 4.10 (*)    Neutrophils Relative % 79 (*)    All other components within normal limits  COMPREHENSIVE METABOLIC PANEL - Abnormal; Notable for the following:    Glucose, Bld 66  (*)    Albumin 3.0 (*)    GFR calc non Af Amer 70 (*)    GFR calc Af Amer 81 (*)    Anion gap 16 (*)    All other components within normal limits  CBG MONITORING, ED - Abnormal; Notable for the following:    Glucose-Capillary 133 (*)    All other components within normal limits   Imaging Review No results found.   EKG Interpretation None      MDM   Final diagnoses:  None    Sbo,   Spoke to gi who will follow pt and have med admit    Daniel Diego, MD 07/26/14 [phone]

## 2014-07-26 NOTE — Progress Notes (Signed)
Notified Kay with Admissions that pt had arrived from [place] stated just to notify [facility] that pt has arrived from [facility]. Will continue to monitor pt. Ranelle Oyster, RN

## 2014-07-26 NOTE — ED Notes (Signed)
Pt states abdominal pain, vomiting, and possible blockage to colostomy bag along with decreased appetite.

## 2014-07-26 NOTE — ED Notes (Signed)
Family is requesting that pt be transferred to [facility]. Pt states he lives in [place] and his wife does not drive. EDP aware

## 2014-07-26 NOTE — Progress Notes (Signed)
Patient would like something for sleep-NP paged

## 2014-07-26 NOTE — ED Notes (Signed)
Pt complain of abdominal pain 9/10. medicated

## 2014-07-26 NOTE — Progress Notes (Signed)
Notified Dr. Marlou Starks with [facility] that pt has arrived from [facility]. MD stated okay that he would see pt. Ranelle Oyster, RN

## 2014-07-26 NOTE — ED Provider Notes (Addendum)
5:19 PM the family is now saying they would like the patient admitted at [facility] at the living [place]. I notified the hospitalist, Dr. Candiss Norse, who will let the [facility] team know. Will have our Network engineer arrange transport. The patient was reexamined and the family was notified. The patient refused the NG tube in the ER.  Pamella Pert, MD 07/26/14 [place], MD 07/26/14 5997

## 2014-07-26 NOTE — Consult Note (Signed)
Reason for Consult:sbo Referring Physician: Dr. Zackery Barefoot is an 58 y.o. male.  HPI: The patient is a 58 year old black male who began feeling better on Monday. He has had some intermittent abdominal discomfort. He has had some nausea and one episode of vomiting. He apparently quit having output from his colostomy he for some period of time. He is a very poor historian and does not remember much about his own history. He does have a history of Crohn's disease and has had 14 or 15 surgeries on his abdomen.  Past Medical History  Diagnosis Date  . Anxiety   . Low back pain   . 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 Abnormal finding of biliary tract     MRCP shows pancreatic/biliary tract dilation. EUS 2010 confirmed dilation but no chronic pancreaitis or mass. Vascular ectasia crimpoing distal CBD.   [name] Spigelian hernia     bilateral  . Duodenal ulcer 2010    nsaids  . Peristomal hernia   . Small bowel obstruction     Past Surgical History  Procedure Laterality Date  . Colostomy  1992    transverse loop colostomy secondary to a stricture  . Hemicoloectomy w/ anastomosis  1993    R- Dr.DeMason   . Hernia repair  1996    incarcerated periostial hernia  . Esophagogastroduodenoscopy  05/2009    SLF: multiple antral erosions, large ulcer at ansatomosis (postsurgical changes at duodenal bulb and second portion of duodenum) BX c/x NSAIDS.  [name] Flexible sigmoidoscopy  1988    Dr. Laural Golden- suggested rohn's disease but the biopsies were not collaborative.  [name] Appendectomy  1992  . Eus  10/04/2009    Dr. Estill Bakes dilated CBD and main pancreatic duct.  No pancreatic    Family History  Problem Relation Age of Onset  . Cancer Father     prostate   . Prostate cancer Father   . Hypertension Sister   . Cancer Sister    . Depression Sister   . Breast cancer Sister   . COPD Sister   . Aneurysm Brother     deceased, brain aneurysm    Social History:  reports that he quit smoking about 4 years ago. His smoking use included Cigarettes. He has a 1 pack-year smoking history. He has never used smokeless tobacco. He reports that he does not drink alcohol or use illicit drugs.  Allergies:  Allergies  Allergen Reactions  . Penicillins Hives    Medications: I have reviewed the patient's current medications.  Results for orders placed during the hospital encounter of 07/26/14 (from the past 48 hour(s))  CBC WITH DIFFERENTIAL     Status: Abnormal   Collection Time    07/26/14 12:55 PM      Result Value Ref Range   WBC 6.9  4.0 - 10.5 K/uL   RBC 4.10 (*) 4.22 - 5.81 MIL/uL   Hemoglobin 13.4  13.0 - 17.0 g/dL   HCT 39.3  39.0 - 52.0 %   MCV 95.9  78.0 - 100.0 fL   MCH 32.7  26.0 - 34.0 pg   MCHC 34.1  30.0 - 36.0 g/dL   RDW 13.4  11.5 - 15.5 %   Platelets 338  150 - 400 K/uL   Neutrophils Relative % 79 (*) 43 - 77 %   Neutro Abs  5.3  1.7 - 7.7 K/uL   Lymphocytes Relative 12  12 - 46 %   Lymphs Abs 0.9  0.7 - 4.0 K/uL   Monocytes Relative 9  3 - 12 %   Monocytes Absolute 0.6  0.1 - 1.0 K/uL   Eosinophils Relative 0  0 - 5 %   Eosinophils Absolute 0.0  0.0 - 0.7 K/uL   Basophils Relative 0  0 - 1 %   Basophils Absolute 0.0  0.0 - 0.1 K/uL  COMPREHENSIVE METABOLIC PANEL     Status: Abnormal   Collection Time    07/26/14 12:55 PM      Result Value Ref Range   Sodium 138  137 - 147 mEq/L   Potassium 4.0  3.7 - 5.3 mEq/L   Chloride 100  96 - 112 mEq/L   CO2 22  19 - 32 mEq/L   Glucose, Bld 66 (*) 70 - 99 mg/dL   BUN 14  6 - 23 mg/dL   Creatinine, Ser 1.14  0.50 - 1.35 mg/dL   Calcium 9.1  8.4 - 10.5 mg/dL   Total Protein 7.7  6.0 - 8.3 g/dL   Albumin 3.0 (*) 3.5 - 5.2 g/dL   AST 14  0 - 37 U/L   ALT 5  0 - 53 U/L   Alkaline Phosphatase 58  39 - 117 U/L   Total Bilirubin 0.7  0.3 - 1.2 mg/dL    GFR calc non Af Amer 70 (*) >90 mL/min   GFR calc Af Amer 81 (*) >90 mL/min   Comment: (NOTE)     The eGFR has been calculated using the CKD EPI equation.     This calculation has not been validated in all clinical situations.     eGFR's persistently <90 mL/min signify possible Chronic Kidney     Disease.   Anion gap 16 (*) 5 - 15  CBG MONITORING, ED     Status: Abnormal   Collection Time    07/26/14  2:10 PM      Result Value Ref Range   Glucose-Capillary 133 (*) 70 - 99 mg/dL    Ct Abdomen Pelvis W Contrast  07/26/2014   CLINICAL DATA:  Abdominal pain and vomiting. History of Crohn's disease.  EXAM: CT ABDOMEN AND PELVIS WITH CONTRAST  TECHNIQUE: Multidetector CT imaging of the abdomen and pelvis was performed using the standard protocol following bolus administration of intravenous contrast.  CONTRAST:  74m OMNIPAQUE IOHEXOL 300 MG/ML SOLN, 1045mOMNIPAQUE IOHEXOL 300 MG/ML SOLN  COMPARISON:  02/05/2012  FINDINGS: Lung bases are clear. No evidence for free air. Gallbladder appears to be surgically removed. Mild dilatation of the biliary system appears unchanged. No acute abnormality within the liver and the portal venous system is patent. There is a stable dense or hypervascular structure along the inferior spleen that measures 1 cm and likely an incidental finding such as a hemangioma. Stable dilatation of the main pancreatic duct measuring up to 5 mm. Incidentally, the patient has a retro aortic left renal vein. Normal appearance of the adrenal glands. No acute abnormality to the kidneys. Question small left renal cortical cysts.  There is surgical mesh material along the right lower abdominal wall. There is contrast within the ureters and urinary bladder. No gross abnormality to the prostate or bladder.  There appears to be an anastomosis near the rectum. There is decompressed bowel extending cephalad from the rectum. There are dilated loops of bowel in the pelvis, mid abdomen and  right  abdomen. Majority of the dilated loops appear to represent small bowel but there is indeterminate dilated loop in the right lower quadrant which has been chronically dilated. This chronically dilated loop of bowel in the right lower quadrant is adjacent to the right lower quadrant ostomy. The ostomy does not appear to be distended.  The patient has a chronic left lateral abdominal hernia containing small bowel loops. This hernia sac contains loops of bowel which contain oral contrast. However, there are fluid-filled structures within this hernia sac which do not contain contrast but these may also represent small bowel. There appears to some soft tissue edema within this hernia sac. Difficult to exclude fluid within the left abdominal hernia.  Again noted are large mesenteric lymph nodes in the right lower quadrant which are similar to the previous examinations. There is a small right lateral hernia sac best seen on the delayed images, sequence 7, image 311 containing a loop of bowel. No acute bone abnormality.  IMPRESSION: The patient's bowel anatomy is complex due to previous surgery. However, there is significant bowel distension, particularly in the right abdomen. Findings are concerning for a bowel obstruction. A transition point is not clearly identified but the obstruction appears to be distal. There is mild stranding in the right lower quadrant but this appears chronic.  Patient has a chronic left abdominal wall hernia containing loops of small bowel. Difficult to differentiate fluid-filled bowel from possible fluid collections within this hernia. Although there are some edematous changes within this left abdominal hernia, this does not appear to be causing obstruction.  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  Stable hypervascular or dense structure in the spleen.  These results were called by telephone at the time of interpretation on 07/26/2014 at 3:54 pm to Dr. Milton Ferguson , who verbally  acknowledged these results.   Electronically Signed   By: Markus Daft M.D.   On: 07/26/2014 16:10    Review of Systems  Constitutional: Negative.   HENT: Negative.   Eyes: Negative.   Respiratory: Negative.   Cardiovascular: Negative.   Gastrointestinal: Positive for nausea, vomiting and abdominal pain.  Genitourinary: Negative.   Musculoskeletal: Negative.   Skin: Negative.   Neurological: Negative.   Endo/Heme/Allergies: Negative.   Psychiatric/Behavioral: Negative.    Blood pressure 113/70, pulse 81, temperature 98.7 F (37.1 C), temperature source Oral, resp. rate 16, height 5' 4.5" (1.638 m), weight 127 lb 13.9 oz (58 kg), SpO2 96.00%. Physical Exam  Constitutional: He is oriented to person, place, and time. He appears well-developed and well-nourished.  HENT:  Head: Normocephalic and atraumatic.  Eyes: Conjunctivae and EOM are normal. Pupils are equal, round, and reactive to light.  Neck: Normal range of motion. Neck supple.  Cardiovascular: Normal rate, regular rhythm and normal heart sounds.   Respiratory: Effort normal and breath sounds normal.  GI: Soft. Bowel sounds are normal.  There is some mild diffuse tenderness. Soft hernia on left side of abdomen that partially reduces. Ostomy on right side is pink with a small amount of production  Musculoskeletal: Normal range of motion.  Neurological: He is alert and oriented to person, place, and time.  Skin: Skin is warm and dry.  Psychiatric: He has a normal mood and affect. His behavior is normal.    Assessment/Plan: The patient appears to have at least partial blockage most likely secondary to scar tissue from his multiple previous surgeries. He does have some reduction in his colostomy bag which I think  is a good sign. At this point I would agree with bowel rest and close monitoring. If he does not improve over the next couple days then he may come to require surgery. I believe surgery on him would be very difficult given  the amount of surgery that he said in the past. He would like to avoid any possible surgery if he can. We will continue to follow closely with you.  TOTH III,Kilynn Fitzsimmons S 07/26/2014, 10:01 PM

## 2014-07-26 NOTE — ED Notes (Signed)
Pt finished with oral contrast, CT notified

## 2014-07-26 NOTE — ED Notes (Signed)
Pt and family now stating that his GI doctors are at [facility] in [place] and that he wants to go there.

## 2014-07-27 ENCOUNTER — Inpatient Hospital Stay (HOSPITAL_COMMUNITY): Payer: Medicare Other

## 2014-07-27 DIAGNOSIS — E43 Unspecified severe protein-calorie malnutrition: Secondary | ICD-10-CM | POA: Insufficient documentation

## 2014-07-27 LAB — BASIC METABOLIC PANEL
Anion gap: 13 (ref 5–15)
BUN: 13 mg/dL (ref 6–23)
CO2: 24 meq/L (ref 19–32)
Calcium: 8.6 mg/dL (ref 8.4–10.5)
Chloride: 100 mEq/L (ref 96–112)
Creatinine, Ser: 1.23 mg/dL (ref 0.50–1.35)
GFR calc Af Amer: 74 mL/min — ABNORMAL LOW (ref >90)
GFR calc non Af Amer: 64 mL/min — ABNORMAL LOW (ref >90)
Glucose, Bld: 111 mg/dL — ABNORMAL HIGH (ref 70–99)
POTASSIUM: 4.3 meq/L (ref 3.7–5.3)
Sodium: 137 mEq/L (ref 137–147)

## 2014-07-27 LAB — CBC
HEMATOCRIT: 35 % — AB (ref 39.0–52.0)
Hemoglobin: 11.9 g/dL — ABNORMAL LOW (ref 13.0–17.0)
MCH: 31.9 pg (ref 26.0–34.0)
MCHC: 34 g/dL (ref 30.0–36.0)
MCV: 93.8 fL (ref 78.0–100.0)
Platelets: 356 10*3/uL (ref 150–400)
RBC: 3.73 MIL/uL — ABNORMAL LOW (ref 4.22–5.81)
RDW: 13.4 % (ref 11.5–15.5)
WBC: 4.1 10*3/uL (ref 4.0–10.5)

## 2014-07-27 LAB — CORTISOL: Cortisol, Plasma: 23.2 ug/dL

## 2014-07-27 MED ORDER — BOOST / RESOURCE BREEZE PO LIQD
1.0000 | Freq: Three times a day (TID) | ORAL | Status: DC
  Administered 2014-07-27 – 2014-07-28 (×4): 1 via ORAL

## 2014-07-27 MED ORDER — HYDROMORPHONE HCL PF 1 MG/ML IJ SOLN
0.5000 mg | INTRAMUSCULAR | Status: DC | PRN
  Administered 2014-07-28: 0.5 mg via INTRAVENOUS
  Filled 2014-07-27: qty 1

## 2014-07-27 NOTE — Progress Notes (Signed)
MD Schorr paged re: pt c/o abdominal pain, no PRN available

## 2014-07-27 NOTE — Progress Notes (Addendum)
INITIAL NUTRITION ASSESSMENT  DOCUMENTATION CODES Per approved criteria  -Severe malnutrition in the context of chronic illness   INTERVENTION:  Diet advancement as tolerated per MD.  Resource Breeze PO TID, each supplement provides 250 kcal and 9 grams of protein  NUTRITION DIAGNOSIS: Malnutrition related to inadequate oral intake as evidenced by severe depletion of subcutaneous fat and muscle mass.   Goal: Intake to meet >90% of estimated nutrition needs.  Monitor:  PO intake, labs, weight trend.  Reason for Assessment: MST  58 y.o. male  Admitting Dx: SBO (small bowel obstruction)  ASSESSMENT: 58 y.o. male, history of Crohn's disease requiring surgery and ileostomy over 10 years ago, multiple small bowel obstructions and ventral hernias requiring surgeries, chronic pain, anxiety, who presents to the hospital with 2-3 day history of generalized abdominal pain, decreased stool output and gas output in his ileostomy bag, mild nausea but no emesis, came to the ER where CT scan was suggested for small bowel obstruction.  Patient endorses poor appetite, abdominal pain, weight loss for the past few months. He is lactose intolerant. Has tried Almond milk and tolerated it well in the past.  Nutrition Focused Physical Exam:  Subcutaneous Fat:  Orbital Region: moderate depletion Upper Arm Region: severe depletion Thoracic and Lumbar Region: severe depletion  Muscle:  Temple Region: moderate depletion Clavicle Bone Region: severe depletion Clavicle and Acromion Bone Region: severe depletion Scapular Bone Region: severe depletion Dorsal Hand: moderate depletion Patellar Region: mild depletion Anterior Thigh Region: mild depletion Posterior Calf Region: mild depletion  Edema: none  Pt meets criteria for severe MALNUTRITION in the context of chronic illness as evidenced by severe depletion of subcutaneous fat and muscle mass.   Height: Ht Readings from Last 1 Encounters:   07/26/14 5' 4.5" (1.638 m)    Weight: Wt Readings from Last 1 Encounters:  07/27/14 127 lb 13.9 oz (58 kg)    Ideal Body Weight: 60.5 kg  % Ideal Body Weight: 96%  Wt Readings from Last 10 Encounters:  07/27/14 127 lb 13.9 oz (58 kg)  06/09/14 131 lb (59.421 kg)  06/06/14 131 lb (59.421 kg)  02/22/14 144 lb (65.318 kg)  01/25/14 143 lb (64.864 kg)  07/29/13 144 lb (65.318 kg)  07/15/13 141 lb 9.6 oz (64.229 kg)  05/23/13 145 lb (65.772 kg)  02/10/13 144 lb (65.318 kg)  02/01/13 145 lb (65.772 kg)    Usual Body Weight: 144 lb 5 months ago  % Usual Body Weight: 88%  BMI:  Body mass index is 21.62 kg/(m^2).  Estimated Nutritional Needs: Kcal: 1800-2000 Protein: 85-100 gm Fluid: 2 L  Skin: WDL  Diet Order: Clear Liquid  EDUCATION NEEDS: -Education not appropriate at this time   Intake/Output Summary (Last 24 hours) at 07/27/14 1328 Last data filed at 07/27/14 1120  Gross per 24 hour  Intake      0 ml  Output    650 ml  Net   -650 ml    Last BM: 8/6   Labs:   Recent Labs Lab 07/26/14 1255 07/26/14 2243 07/27/14 0710  NA 138  --  137  K 4.0  --  4.3  CL 100  --  100  CO2 22  --  24  BUN 14  --  13  CREATININE 1.14 1.12 1.23  CALCIUM 9.1  --  8.6  GLUCOSE 66*  --  111*    CBG (last 3)   Recent Labs  07/26/14 1410  GLUCAP 133*  Scheduled Meds: . feeding supplement (RESOURCE BREEZE)  1 Container Oral TID BM  . heparin  5,000 Units Subcutaneous 3 times per day    Continuous Infusions: . dextrose 5 % and 0.45% NaCl 75 mL/hr at 07/27/14 1941    Past Medical History  Diagnosis Date  . Anxiety   . Low back pain   . 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 Abnormal finding of biliary tract     MRCP shows pancreatic/biliary tract dilation. EUS 2010 confirmed dilation but no  chronic pancreaitis or mass. Vascular ectasia crimpoing distal CBD.   [name] Spigelian hernia     bilateral  . Duodenal ulcer 2010    nsaids  . Peristomal hernia   . Small bowel obstruction     Past Surgical History  Procedure Laterality Date  . Colostomy  1992    transverse loop colostomy secondary to a stricture  . Hemicoloectomy w/ anastomosis  1993    R- Dr.DeMason   . Hernia repair  1996    incarcerated periostial hernia  . Esophagogastroduodenoscopy  05/2009    SLF: multiple antral erosions, large ulcer at ansatomosis (postsurgical changes at duodenal bulb and second portion of duodenum) BX c/x NSAIDS.  [name] Flexible sigmoidoscopy  1988    Dr. Laural Golden- suggested rohn's disease but the biopsies were not collaborative.  [name] Appendectomy  1992  . Eus  10/04/2009    Dr. Estill Bakes dilated CBD and main pancreatic duct.  No pancreatic    Molli Barrows, RD, LDN, [place] Pager [phone] After Hours Pager [phone]

## 2014-07-27 NOTE — Progress Notes (Signed)
Utilization review completed.  

## 2014-07-27 NOTE — Progress Notes (Signed)
PATIENT DETAILS Name: Daniel Howard Age: 58 y.o. Sex: male Date of Birth: 19-Oct-1956 Admit Date: 07/26/2014 Admitting Physician Etta Quill, DO [id number], Lonell Grandchild, MD  Subjective: Patient has no new complaints this morning. He does not have an appetite. Otherwise, patient denies headache, dizziness, chest pain, shortness of breath, abdominal pain, nausea or vomiting.  PHYSICAL EXAM: Vital signs in last 24 hours: Filed Vitals:   07/26/14 2015 07/26/14 2112 07/27/14 0131 07/27/14 0650  BP: 116/72 113/70  94/57  Pulse: 81 81  79  Temp:  98.7 F (37.1 C)  98.6 F (37 C)  TempSrc:  Oral  Oral  Resp: 16 16  16   Height:  5' 4.5" (1.638 m)    Weight:  58 kg (127 lb 13.9 oz) 58 kg (127 lb 13.9 oz)   SpO2: 98% 96%  99%  Weight change:  Filed Weights   07/26/14 1150 07/26/14 2112 07/27/14 0131  Weight: 58.968 kg (130 lb) 58 kg (127 lb 13.9 oz) 58 kg (127 lb 13.9 oz)  Body mass index is 21.62 kg/(m^2).  Gen Exam: Awake and alert with clear speech, abnormal affect.  Neck: Supple, No JVD.   Chest: symmetrical movement, no accessory muscle use, B/L Clear, no wheezes or rales heard.  CVS: RRR, S1 S2 Regular, no murmurs, gallops or rubs. Abdomen: soft, hypoactive bowel sounds, non tender to palpation, non distended. 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 Multiple scars from previous surgeries. Extremities: No edema, lower extremities warm to touch. Moves all extremities without difficulty. Neurologic: Non Focal.   Skin: No Rash.   Assessment/Plan:  Partial Small Bowel Obstruction -8/6/15Abdominal X-ray demonstrates persistent partial bowel obstruction; per surgery, not a great surgical candidate. -07/27/14 Sips of liquids, no NG tube at this time; place if cilnically worsens or vomits. -optimize electrolytes -minimize dilaudid to promote GI movement; analgesics and antiemetics as needed -Continue daily abdominal imaging to monitor status of obstruction  -07/27/2014-Liquid  stool noted in ostomy with gas -last SBO approximately 2 years ago. Last abdominal surgery more than 10 years ago -continue IVF -appreciate surgical follow up Chron's Disease -Not currently on solumedrol or prednisone -Home regimen azathioprine on hold secondary to npo--patient has been on this for approximately 3 years Normocytic Anemia -Monitor for now -Likely anemia of chronic disease with dilutionall component Mild hypoglycemia -Likely do to poor by mouth intake -No history of diabetes mellitus -Followup TSH--0.603 -am cortisol--23.2    Disposition: Remain inpatient  DVT Prophylaxis: Prophylactic Heparin   Code Status: Full code   Family Communication No family present  Procedures:  None scheduled at this time  CONSULTS: GI  Time spent 40 minutes-which includes 50% of the time with face-to-face with patient/ family and coordinating care related to the above assessment and plan.  MEDICATIONS: Scheduled Meds: . heparin  5,000 Units Subcutaneous 3 times per day   Continuous Infusions: . dextrose 5 % and 0.45% NaCl 75 mL/hr at 07/27/14 0826   PRN Meds:.guaiFENesin-dextromethorphan, ondansetron (ZOFRAN) IV, ondansetron  Antibiotics: Anti-infectives   None     Intake/Output from previous day:  Intake/Output Summary (Last 24 hours) at 07/27/14 1130 Last data filed at 07/27/14 1120  Gross per 24 hour  Intake      0 ml  Output    650 ml  Net   -650 ml   LAB RESULTS: CBC  Recent Labs Lab 07/26/14 1255 07/26/14 2243 07/27/14 0710  WBC 6.9 5.8 4.1  HGB 13.4 13.9 11.9*  HCT 39.3 40.6 35.0*  PLT 338 391 356  MCV 95.9 95.8 93.8  MCH 32.7 32.8 31.9  MCHC 34.1 34.2 34.0  RDW 13.4 13.5 13.4  LYMPHSABS 0.9  --   --   MONOABS 0.6  --   --   EOSABS 0.0  --   --   BASOSABS 0.0  --   --    Chemistries   Recent Labs Lab 07/26/14 1255 07/26/14 2243 07/27/14 0710  NA 138  --  137  K 4.0  --  4.3  CL 100  --  100  CO2 22  --  24  GLUCOSE 66*  --   111*  BUN 14  --  13  CREATININE 1.14 1.12 1.23  CALCIUM 9.1  --  8.6   CBG:  Recent Labs Lab 07/26/14 1410  GLUCAP 133*   GFR Estimated Creatinine Clearance: 55 ml/min (by C-G formula based on Cr of 1.23).  Recent Labs  07/26/14 1255  TSH 0.603    RADIOLOGY STUDIES/RESULTS: Ct Abdomen Pelvis W Contrast 07/26/2014   CLINICAL DATA:  Abdominal pain and vomiting. History of Crohn's disease.  EXAM: CT ABDOMEN AND PELVIS WITH CONTRAST  TECHNIQUE: Multidetector CT imaging of the abdomen and pelvis was performed using the standard protocol following bolus administration of intravenous contrast.  CONTRAST:  61m OMNIPAQUE IOHEXOL 300 MG/ML SOLN, 109mOMNIPAQUE IOHEXOL 300 MG/ML SOLN  COMPARISON:  02/05/2012  FINDINGS: Lung bases are clear. No evidence for free air. Gallbladder appears to be surgically removed. Mild dilatation of the biliary system appears unchanged. No acute abnormality within the liver and the portal venous system is patent. There is a stable dense or hypervascular structure along the inferior spleen that measures 1 cm and likely an incidental finding such as a hemangioma. Stable dilatation of the main pancreatic duct measuring up to 5 mm. Incidentally, the patient has a retro aortic left renal vein. Normal appearance of the adrenal glands. No acute abnormality to the kidneys. Question small left renal cortical cysts.  There is surgical mesh material along the right lower abdominal wall. There is contrast within the ureters and urinary bladder. No gross abnormality to the prostate or bladder.  There appears to be an anastomosis near the rectum. There is decompressed bowel extending cephalad from the rectum. There are dilated loops of bowel in the pelvis, mid abdomen and right abdomen. Majority of the dilated loops appear to represent small bowel but there is indeterminate dilated loop in the right lower quadrant which has been chronically dilated. This chronically dilated loop of  bowel in the right lower quadrant is adjacent to the right lower quadrant ostomy. The ostomy does not appear to be distended.  The patient has a chronic left lateral abdominal hernia containing small bowel loops. This hernia sac contains loops of bowel which contain oral contrast. However, there are fluid-filled structures within this hernia sac which do not contain contrast but these may also represent small bowel. There appears to some soft tissue edema within this hernia sac. Difficult to exclude fluid within the left abdominal hernia.  Again noted are large mesenteric lymph nodes in the right lower quadrant which are similar to the previous examinations. There is a small right lateral hernia sac best seen on the delayed images, sequence 7, image 311 containing a loop of bowel. No acute bone abnormality.  IMPRESSION: The patient's bowel anatomy is complex due to previous surgery. However, there is significant bowel distension, particularly in the right abdomen. Findings  are concerning for a bowel obstruction. A transition point is not clearly identified but the obstruction appears to be distal. There is mild stranding in the right lower quadrant but this appears chronic.  Patient has a chronic left abdominal wall hernia containing loops of small bowel. Difficult to differentiate fluid-filled bowel from possible fluid collections within this hernia. Although there are some edematous changes within this left abdominal hernia, this does not appear to be causing obstruction.  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  Stable hypervascular or dense structure in the spleen.  These results were called by telephone at the time of interpretation on 07/26/2014 at 3:54 pm to Dr. Milton Ferguson , who verbally acknowledged these results.   Electronically Signed   By: Markus Daft M.D.   On: 07/26/2014 16:10   Dg Abd Portable 1v 07/27/2014   CLINICAL DATA:  Abdominal pain.  Bowel obstruction.  EXAM: PORTABLE ABDOMEN  - 1 VIEW  COMPARISON:  CT scan dated 07/26/2014  FINDINGS: There are multiple dilated loops of bowel throughout the abdomen. There is a small amount of CT contrast in the ostomy bag. There is residual contrast in the ureters and bladder and the prior CT scan. No osseous abnormality.  IMPRESSION: Persistent partial bowel obstruction.   Electronically Signed   By: Rozetta Nunnery M.D.   On: 07/27/2014 07:47   Audree Bane [facility] Pager:[phone]  If 7PM-7AM, please contact night-coverage [email or web address] Password [email or web address] 07/27/2014, 11:30 AM   LOS: 1 day   **Disclaimer: This note may have been dictated with voice recognition software. Similar sounding words can inadvertently be transcribed and this note may contain transcription errors which may not have been corrected upon publication of note.**

## 2014-07-27 NOTE — Progress Notes (Signed)
Subjective: He feels much better this morning and reports having much more ostomy output  Objective: Vital signs in last 24 hours: Temp:  [98.1 F (36.7 C)-98.7 F (37.1 C)] 98.6 F (37 C) (08/06 0650) Pulse Rate:  [79-96] 79 (08/06 0650) Resp:  [16] 16 (08/06 0650) BP: (94-118)/(57-82) 94/57 mmHg (08/06 0650) SpO2:  [96 %-100 %] 99 % (08/06 0650) Weight:  [127 lb 13.9 oz (58 kg)-130 lb (58.968 kg)] 127 lb 13.9 oz (58 kg) (08/06 0131) Last BM Date: 07/27/14  Intake/Output from previous day: 08/05 0701 - 08/06 0700 In: -  Out: 450 [Urine:150; Stool:300] Intake/Output this shift:   Abdomen is soft and non tender currently Ostomy with stool in bag Hernia soft, non tender  Lab Results:   Recent Labs  07/26/14 2243 07/27/14 0710  WBC 5.8 4.1  HGB 13.9 11.9*  HCT 40.6 35.0*  PLT 391 356   BMET  Recent Labs  07/26/14 1255 07/26/14 2243  NA 138  --   K 4.0  --   CL 100  --   CO2 22  --   GLUCOSE 66*  --   BUN 14  --   CREATININE 1.14 1.12  CALCIUM 9.1  --    PT/INR No results found for this basename: LABPROT, INR,  in the last 72 hours ABG No results found for this basename: PHART, PCO2, PO2, HCO3,  in the last 72 hours  Studies/Results: Ct Abdomen Pelvis W Contrast  07/26/2014   CLINICAL DATA:  Abdominal pain and vomiting. History of Crohn's disease.  EXAM: CT ABDOMEN AND PELVIS WITH CONTRAST  TECHNIQUE: Multidetector CT imaging of the abdomen and pelvis was performed using the standard protocol following bolus administration of intravenous contrast.  CONTRAST:  65m OMNIPAQUE IOHEXOL 300 MG/ML SOLN, 1015mOMNIPAQUE IOHEXOL 300 MG/ML SOLN  COMPARISON:  02/05/2012  FINDINGS: Lung bases are clear. No evidence for free air. Gallbladder appears to be surgically removed. Mild dilatation of the biliary system appears unchanged. No acute abnormality within the liver and the portal venous system is patent. There is a stable dense or hypervascular structure along the  inferior spleen that measures 1 cm and likely an incidental finding such as a hemangioma. Stable dilatation of the main pancreatic duct measuring up to 5 mm. Incidentally, the patient has a retro aortic left renal vein. Normal appearance of the adrenal glands. No acute abnormality to the kidneys. Question small left renal cortical cysts.  There is surgical mesh material along the right lower abdominal wall. There is contrast within the ureters and urinary bladder. No gross abnormality to the prostate or bladder.  There appears to be an anastomosis near the rectum. There is decompressed bowel extending cephalad from the rectum. There are dilated loops of bowel in the pelvis, mid abdomen and right abdomen. Majority of the dilated loops appear to represent small bowel but there is indeterminate dilated loop in the right lower quadrant which has been chronically dilated. This chronically dilated loop of bowel in the right lower quadrant is adjacent to the right lower quadrant ostomy. The ostomy does not appear to be distended.  The patient has a chronic left lateral abdominal hernia containing small bowel loops. This hernia sac contains loops of bowel which contain oral contrast. However, there are fluid-filled structures within this hernia sac which do not contain contrast but these may also represent small bowel. There appears to some soft tissue edema within this hernia sac. Difficult to exclude fluid within the left abdominal  hernia.  Again noted are large mesenteric lymph nodes in the right lower quadrant which are similar to the previous examinations. There is a small right lateral hernia sac best seen on the delayed images, sequence 7, image 311 containing a loop of bowel. No acute bone abnormality.  IMPRESSION: The patient's bowel anatomy is complex due to previous surgery. However, there is significant bowel distension, particularly in the right abdomen. Findings are concerning for a bowel obstruction. A  transition point is not clearly identified but the obstruction appears to be distal. There is mild stranding in the right lower quadrant but this appears chronic.  Patient has a chronic left abdominal wall hernia containing loops of small bowel. Difficult to differentiate fluid-filled bowel from possible fluid collections within this hernia. Although there are some edematous changes within this left abdominal hernia, this does not appear to be causing obstruction.  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  Stable hypervascular or dense structure in the spleen.  These results were called by telephone at the time of interpretation on 07/26/2014 at 3:54 pm to Dr. Milton Ferguson , who verbally acknowledged these results.   Electronically Signed   By: Markus Daft M.D.   On: 07/26/2014 16:10   Dg Abd Portable 1v  07/27/2014   CLINICAL DATA:  Abdominal pain.  Bowel obstruction.  EXAM: PORTABLE ABDOMEN - 1 VIEW  COMPARISON:  CT scan dated 07/26/2014  FINDINGS: There are multiple dilated loops of bowel throughout the abdomen. There is a small amount of CT contrast in the ostomy bag. There is residual contrast in the ureters and bladder and the prior CT scan. No osseous abnormality.  IMPRESSION: Persistent partial bowel obstruction.   Electronically Signed   By: Rozetta Nunnery M.D.   On: 07/27/2014 07:47    Anti-infectives: Anti-infectives   None      Assessment/Plan: s/p * No surgery found *  Partial SBO  Will continue conservative treatment as surgery on him will be quite difficult.  I he worsens, will need and NG tube Will allow sips of liquids today  LOS: 1 day    Elyjah Hazan A 07/27/2014

## 2014-07-27 NOTE — Care Management Note (Signed)
    Page 1 of 1   07/28/2014     11:46:04 AM CARE MANAGEMENT NOTE 07/28/2014  Patient:  Daniel Howard, Daniel Howard   Account Number:  [id number]  Date Initiated:  07/27/2014  Documentation initiated by:  Tomi Bamberger  Subjective/Objective Assessment:   dx crohns dz, p/sbo  admit- lives with spouse.     Action/Plan:   surgery /GI consulted  8/7 for xray today.   Anticipated DC Date:  07/29/2014   Anticipated DC Plan:  [facility]  CM consult      Choice offered to / List presented to:             Status of service:  In process, will continue to follow Medicare Important Message given?  YES (If response is "NO", the following Medicare IM given date fields will be blank) Date Medicare IM given:  07/28/2014 Medicare IM given by:  Tomi Bamberger Date Additional Medicare IM given:   Additional Medicare IM given by:    Discharge Disposition:    Per UR Regulation:  Reviewed for med. necessity/level of care/duration of stay  If discussed at Rew of Stay Meetings, dates discussed:    Comments:

## 2014-07-28 ENCOUNTER — Inpatient Hospital Stay (HOSPITAL_COMMUNITY): Payer: Medicare Other

## 2014-07-28 DIAGNOSIS — E43 Unspecified severe protein-calorie malnutrition: Secondary | ICD-10-CM

## 2014-07-28 LAB — BASIC METABOLIC PANEL
Anion gap: 11 (ref 5–15)
BUN: 8 mg/dL (ref 6–23)
CO2: 22 meq/L (ref 19–32)
Calcium: 8.3 mg/dL — ABNORMAL LOW (ref 8.4–10.5)
Chloride: 100 mEq/L (ref 96–112)
Creatinine, Ser: 1.03 mg/dL (ref 0.50–1.35)
GFR calc Af Amer: >90 mL/min (ref >90)
GFR, EST NON AFRICAN AMERICAN: 79 mL/min — AB (ref >90)
GLUCOSE: 94 mg/dL (ref 70–99)
POTASSIUM: 4.1 meq/L (ref 3.7–5.3)
Sodium: 133 mEq/L — ABNORMAL LOW (ref 137–147)

## 2014-07-28 LAB — GLUCOSE, CAPILLARY
GLUCOSE-CAPILLARY: 90 mg/dL (ref 70–99)
Glucose-Capillary: 102 mg/dL — ABNORMAL HIGH (ref 70–99)

## 2014-07-28 MED ORDER — OXYCODONE HCL 5 MG PO TABS: 5.0000 mg | ORAL_TABLET | Freq: Three times a day (TID) | ORAL | Status: DC | PRN

## 2014-07-28 MED ORDER — AZATHIOPRINE 50 MG PO TABS
50.0000 mg | ORAL_TABLET | Freq: Every day | ORAL | Status: DC
  Administered 2014-07-28: 50 mg via ORAL
  Filled 2014-07-28: qty 1

## 2014-07-28 MED ORDER — AZATHIOPRINE 50 MG PO TABS
25.0000 mg | ORAL_TABLET | Freq: Every day | ORAL | Status: DC
  Filled 2014-07-28: qty 1

## 2014-07-28 MED ORDER — HYDROCODONE-ACETAMINOPHEN 5-325 MG PO TABS: ORAL_TABLET | ORAL | Status: DC

## 2014-07-28 MED ORDER — KETOROLAC TROMETHAMINE 30 MG/ML IJ SOLN: 30.0000 mg | Freq: Three times a day (TID) | INTRAMUSCULAR | Status: DC | PRN

## 2014-07-28 MED ORDER — BOOST / RESOURCE BREEZE PO LIQD: 1.0000 | Freq: Three times a day (TID) | ORAL | Status: DC

## 2014-07-28 MED ORDER — OXYCODONE HCL 5 MG PO TABS: 5.0000 mg | ORAL_TABLET | Freq: Four times a day (QID) | ORAL | Status: DC | PRN

## 2014-07-28 MED ORDER — MIRTAZAPINE 15 MG PO TABS
15.0000 mg | ORAL_TABLET | Freq: Every day | ORAL | Status: DC
  Filled 2014-07-28: qty 1

## 2014-07-28 NOTE — Progress Notes (Signed)
PATIENT DETAILS Name: Daniel Howard Age: 58 y.o. Sex: male Date of Birth: 04/28/1956 Admit Date: 07/26/2014 Admitting Physician Etta Quill, DO HYQ:[id number], Lonell Grandchild, MD  Subjective: Patient has no new complaints this morning. Last night he had one episode of RLQ abdominal pain requiring medication after ambulating. No current pain. Patient denies headache, dizziness, chest pain, palpitations, SOB, abdominal pain, nausea and vomiting.   PHYSICAL EXAM: Vital signs in last 24 hours: Filed Vitals:   07/27/14 0650 07/27/14 1342 07/27/14 2129 07/28/14 0524  BP: 94/57 106/70 91/56 104/61  Pulse: 79 80 88 71  Temp: 98.6 F (37 C) 99.2 F (37.3 C) 97.8 F (36.6 C) 98.5 F (36.9 C)  TempSrc: Oral Oral Oral Oral  Resp: 16 16 18 18   Height:      Weight:    57.4 kg (126 lb 8.7 oz)  SpO2: 99% 98% 97% 96%  Weight change: -1.568 kg (-3 lb 7.3 oz) Filed Weights   07/26/14 2112 07/27/14 0131 07/28/14 0524  Weight: 58 kg (127 lb 13.9 oz) 58 kg (127 lb 13.9 oz) 57.4 kg (126 lb 8.7 oz)  Body mass index is 21.39 kg/(m^2).    Gen Exam: Awake and alert with clear speech, pleasant affect.  Neck: Supple, No JVD.  Chest: B/L Clear; no wheezes, rales or rhonci CVS: RRR, S1 S2 Regular, no murmurs, gallops or rubs Abdomen: soft, hypoactive BS, mildly tender to LLQ and RLQ, non distended. Liquid green stool noted in ostomy with gas. Multiple scars noted from previous surgical operations. Extremities: no edema, lower extremities warm to touch. Moves all extremities. Neurologic: Non Focal.     Skin: No Rash.   Assessment/Plan:  Partial Small Bowel Obstruction  -8/6/15Abdominal X-ray demonstrated persistent partial bowel obstruction; per surgery, not a great surgical candidate.  -07/28/14 Abdominal X-ray demonstrates resolution of small bowel dilation. -07/28/14 Transitioned to full liquid diet for breakfast (tolerated well).  Advance to soft solids for lunch.   Resume po home meds  azathioprine and mirtazapine.  -continue to optimize electrolytes  -eliminate dilaudid to promote GI movement; Toradol ordered q 8 hours PRN. Analgesics and antiemetics as needed  -07/28/2014-Liquid green stool noted in ostomy with gas  -last SBO approximately 2 years ago. Last abdominal surgery more than 10 years ago  -appreciate surgical follow up   Hyponatremia and Hypocalcemia -Suspect secondary to IVF, d/c  -Will follow electrolytes outpatient.  Crohns Disease  -Resume home regimen azathioprine   Mild hypoglycemia  -Likely do to poor by mouth intake  -No history of diabetes mellitus  -Morning CBG 90  Disposition: Remain inpatient.  Possible d/c this pm or 8/8 if he tolerates solid food.  DVT Prophylaxis: Prophylactic Heparin   Code Status: Full code   Family Communication No family present  Procedures:  None at this time  CONSULTS:  GI, Surgery  MEDICATIONS: Scheduled Meds: . azaTHIOprine  25 mg Oral QHS  . azaTHIOprine  50 mg Oral Daily  . feeding supplement (RESOURCE BREEZE)  1 Container Oral TID BM  . heparin  5,000 Units Subcutaneous 3 times per day  . mirtazapine  15 mg Oral QHS   Continuous Infusions:  PRN Meds:.guaiFENesin-dextromethorphan, HYDROmorphone (DILAUDID) injection, ketorolac, ondansetron (ZOFRAN) IV, ondansetron  Antibiotics: Anti-infectives   None     Intake/Output from previous day:  Intake/Output Summary (Last 24 hours) at 07/28/14 1054 Last data filed at 07/28/14 0457  Gross per 24 hour  Intake   1344 ml  Output    200 ml  Net   1144 ml   LAB RESULTS: CBC  Recent Labs Lab 07/26/14 1255 07/26/14 2243 07/27/14 0710  WBC 6.9 5.8 4.1  HGB 13.4 13.9 11.9*  HCT 39.3 40.6 35.0*  PLT 338 391 356  MCV 95.9 95.8 93.8  MCH 32.7 32.8 31.9  MCHC 34.1 34.2 34.0  RDW 13.4 13.5 13.4  LYMPHSABS 0.9  --   --   MONOABS 0.6  --   --   EOSABS 0.0  --   --   BASOSABS 0.0  --   --    Chemistries   Recent Labs Lab 07/26/14 1255  07/26/14 2243 07/27/14 0710 07/28/14 0510  NA 138  --  137 133*  K 4.0  --  4.3 4.1  CL 100  --  100 100  CO2 22  --  24 22  GLUCOSE 66*  --  111* 94  BUN 14  --  13 8  CREATININE 1.14 1.12 1.23 1.03  CALCIUM 9.1  --  8.6 8.3*   CBG:  Recent Labs Lab 07/26/14 1410 07/28/14 0800  GLUCAP 133* 90   GFR Estimated Creatinine Clearance: 65 ml/min (by C-G formula based on Cr of 1.03).  Recent Labs  07/26/14 1255  TSH 0.603   RADIOLOGY STUDIES/RESULTS: Ct Abdomen Pelvis W Contrast 07/26/2014   CLINICAL DATA:  Abdominal pain and vomiting. History of Crohn's disease.  EXAM: CT ABDOMEN AND PELVIS WITH CONTRAST  TECHNIQUE: Multidetector CT imaging of the abdomen and pelvis was performed using the standard protocol following bolus administration of intravenous contrast.  CONTRAST:  62m OMNIPAQUE IOHEXOL 300 MG/ML SOLN, 1040mOMNIPAQUE IOHEXOL 300 MG/ML SOLN  COMPARISON:  02/05/2012  FINDINGS: Lung bases are clear. No evidence for free air. Gallbladder appears to be surgically removed. Mild dilatation of the biliary system appears unchanged. No acute abnormality within the liver and the portal venous system is patent. There is a stable dense or hypervascular structure along the inferior spleen that measures 1 cm and likely an incidental finding such as a hemangioma. Stable dilatation of the main pancreatic duct measuring up to 5 mm. Incidentally, the patient has a retro aortic left renal vein. Normal appearance of the adrenal glands. No acute abnormality to the kidneys. Question small left renal cortical cysts.  There is surgical mesh material along the right lower abdominal wall. There is contrast within the ureters and urinary bladder. No gross abnormality to the prostate or bladder.  There appears to be an anastomosis near the rectum. There is decompressed bowel extending cephalad from the rectum. There are dilated loops of bowel in the pelvis, mid abdomen and right abdomen. Majority of the  dilated loops appear to represent small bowel but there is indeterminate dilated loop in the right lower quadrant which has been chronically dilated. This chronically dilated loop of bowel in the right lower quadrant is adjacent to the right lower quadrant ostomy. The ostomy does not appear to be distended.  The patient has a chronic left lateral abdominal hernia containing small bowel loops. This hernia sac contains loops of bowel which contain oral contrast. However, there are fluid-filled structures within this hernia sac which do not contain contrast but these may also represent small bowel. There appears to some soft tissue edema within this hernia sac. Difficult to exclude fluid within the left abdominal hernia.  Again noted are large mesenteric lymph nodes in the right lower quadrant which are similar to the previous examinations. There  is a small right lateral hernia sac best seen on the delayed images, sequence 7, image 311 containing a loop of bowel. No acute bone abnormality.  IMPRESSION: The patient's bowel anatomy is complex due to previous surgery. However, there is significant bowel distension, particularly in the right abdomen. Findings are concerning for a bowel obstruction. A transition point is not clearly identified but the obstruction appears to be distal. There is mild stranding in the right lower quadrant but this appears chronic.  Patient has a chronic left abdominal wall hernia containing loops of small bowel. Difficult to differentiate fluid-filled bowel from possible fluid collections within this hernia. Although there are some edematous changes within this left abdominal hernia, this does not appear to be causing obstruction.  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  Stable hypervascular or dense structure in the spleen.  These results were called by telephone at the time of interpretation on 07/26/2014 at 3:54 pm to Dr. Milton Ferguson , who verbally acknowledged these  results.   Electronically Signed   By: Markus Daft M.D.   On: 07/26/2014 16:10   Dg Abd Portable 1v 07/27/2014   CLINICAL DATA:  Abdominal pain.  Bowel obstruction.  EXAM: PORTABLE ABDOMEN - 1 VIEW  COMPARISON:  CT scan dated 07/26/2014  FINDINGS: There are multiple dilated loops of bowel throughout the abdomen. There is a small amount of CT contrast in the ostomy bag. There is residual contrast in the ureters and bladder and the prior CT scan. No osseous abnormality.  IMPRESSION: Persistent partial bowel obstruction.   Electronically Signed   By: Rozetta Nunnery M.D.   On: 07/27/2014 07:47   Shella Spearing [facility] Pager:[phone]  If 7PM-7AM, please contact night-coverage [email or web address] Password [email or web address] 07/28/2014, 10:54 AM   LOS: 2 days   **Disclaimer: This note may have been dictated with voice recognition software. Similar sounding words can inadvertently be transcribed and this note may contain transcription errors which may not have been corrected upon publication of note.**

## 2014-07-28 NOTE — Progress Notes (Signed)
NURSING PROGRESS NOTE  Daniel Howard [id number] Discharge Data: 07/28/2014 5:27 PM Attending Provider: Orson Eva, MD SSQ:[id number], Lonell Grandchild, MD     [name] to be D/C'd Home per MD order.  Discussed with the patient the After Visit Summary and all questions fully answered. All IV's discontinued with no bleeding noted. All belongings returned to patient for patient to take home.   Last Vital Signs:  Blood pressure 89/50, pulse 70, temperature 98.8 F (37.1 C), temperature source Oral, resp. rate 18, height 5' 4.5" (1.638 m), weight 57.4 kg (126 lb 8.7 oz), SpO2 98.00%.  Discharge Medication List   Medication List         azaTHIOprine 50 MG tablet  Commonly known as:  IMURAN  TAKE 1 TABLET IN MORNING AND 1/2 IN EVENING     CALCIUM 1200 PO  Take 1,200 mg by mouth daily.     feeding supplement (RESOURCE BREEZE) Liqd  Take 1 Container by mouth 3 (three) times daily with meals.     ferrous gluconate 325 MG tablet  Commonly known as:  FERGON  Take 325 mg by mouth daily with breakfast.     HYDROcodone-acetaminophen 5-325 MG per tablet  Commonly known as:  NORCO  One tablet every 12 hours only if needed for severe pain.     mirtazapine 15 MG tablet  Commonly known as:  REMERON  Take 1 tablet (15 mg total) by mouth at bedtime. For appetite     multivitamins ther. w/minerals Tabs tablet  Take 1 tablet by mouth daily.

## 2014-07-28 NOTE — Progress Notes (Signed)
Patient ID: Daniel Howard, male   DOB: 08/22/1956, 58 y.o.   MRN: [id number]     [facility]      [address] [place], [place] [address]    Phone: [phone] FAX: [phone]     Subjective: No n/v, "tinge" of pain.  Ostomy functioning.    Objective:  Vital signs:  Filed Vitals:   07/27/14 0650 07/27/14 1342 07/27/14 2129 07/28/14 0524  BP: 94/57 106/70 91/56 104/61  Pulse: 79 80 88 71  Temp: 98.6 F (37 C) 99.2 F (37.3 C) 97.8 F (36.6 C) 98.5 F (36.9 C)  TempSrc: Oral Oral Oral Oral  Resp: _0 Height:      Weight:    126 lb 8.7 oz (57.4 kg)  SpO2: 99% 98% 97% 96%    Last BM Date: 07/27/14  Intake/Output   Yesterday:  08/06 0701 - 08/07 0700 In: 6144 [P.O.:444; I.V.:900] Out: 200 [Urine:200] This shift:    I/O last 3 completed shifts: In: 3154 [P.O.:444; I.V.:900] Out: 650 [Urine:350; Stool:300]    Physical Exam: General: Pt awake/alert/oriented x4 in no acute distress Abdomen: Soft.  Nondistended. Non tender.  RLQ ostomy with loose stool.   No evidence of peritonitis.  No incarcerated hernias.    Problem List:   Principal Problem:   SBO (small bowel obstruction) Active Problems:   ABDOMINAL WALL HERNIA   CROHN'S DISEASE   LOW BACK PAIN   Crohn's disease of both small and large intestine with complication   Protein-calorie malnutrition, severe    Results:   Labs: Results for orders placed during the hospital encounter of 07/26/14 (from the past 72 hour(s))  CBC WITH DIFFERENTIAL     Status: Abnormal   Collection Time    07/26/14 12:55 PM      Result Value Ref Range   WBC 6.9  4.0 - 10.5 K/uL   RBC 4.10 (*) 4.22 - 5.81 MIL/uL   Hemoglobin 13.4  13.0 - 17.0 g/dL   HCT 39.3  39.0 - 52.0 %   MCV 95.9  78.0 - 100.0 fL   MCH 32.7  26.0 - 34.0 pg   MCHC 34.1  30.0 - 36.0 g/dL   RDW 13.4  11.5 - 15.5 %   Platelets 338  150 - 400 K/uL   Neutrophils Relative % 79 (*) 43 - 77 %    Neutro Abs 5.3  1.7 - 7.7 K/uL   Lymphocytes Relative 12  12 - 46 %   Lymphs Abs 0.9  0.7 - 4.0 K/uL   Monocytes Relative 9  3 - 12 %   Monocytes Absolute 0.6  0.1 - 1.0 K/uL   Eosinophils Relative 0  0 - 5 %   Eosinophils Absolute 0.0  0.0 - 0.7 K/uL   Basophils Relative 0  0 - 1 %   Basophils Absolute 0.0  0.0 - 0.1 K/uL  COMPREHENSIVE METABOLIC PANEL     Status: Abnormal   Collection Time    07/26/14 12:55 PM      Result Value Ref Range   Sodium 138  137 - 147 mEq/L   Potassium 4.0  3.7 - 5.3 mEq/L   Chloride 100  96 - 112 mEq/L   CO2 22  19 - 32 mEq/L   Glucose, Bld 66 (*) 70 - 99 mg/dL   BUN 14  6 - 23 mg/dL   Creatinine, Ser 1.14  0.50 - 1.35 mg/dL  Calcium 9.1  8.4 - 10.5 mg/dL   Total Protein 7.7  6.0 - 8.3 g/dL   Albumin 3.0 (*) 3.5 - 5.2 g/dL   AST 14  0 - 37 U/L   ALT 5  0 - 53 U/L   Alkaline Phosphatase 58  39 - 117 U/L   Total Bilirubin 0.7  0.3 - 1.2 mg/dL   GFR calc non Af Amer 70 (*) >90 mL/min   GFR calc Af Amer 81 (*) >90 mL/min   Comment: (NOTE)     The eGFR has been calculated using the CKD EPI equation.     This calculation has not been validated in all clinical situations.     eGFR's persistently <90 mL/min signify possible Chronic Kidney     Disease.   Anion gap 16 (*) 5 - 15  TSH     Status: None   Collection Time    07/26/14 12:55 PM      Result Value Ref Range   TSH 0.603  0.350 - 4.500 uIU/mL   Comment: Performed at [facility]     Status: None   Collection Time    07/26/14 12:55 PM      Result Value Ref Range   Cortisol, Plasma 23.2     Comment: (NOTE)     AM:  4.3 - 22.4 ug/dL     PM:  3.1 - 16.7 ug/dL     Performed at [facility] MONITORING, ED     Status: Abnormal   Collection Time    07/26/14  2:10 PM      Result Value Ref Range   Glucose-Capillary 133 (*) 70 - 99 mg/dL  CBC     Status: None   Collection Time    07/26/14 10:43 PM      Result Value Ref Range   WBC 5.8  4.0 - 10.5 K/uL   RBC  4.24  4.22 - 5.81 MIL/uL   Hemoglobin 13.9  13.0 - 17.0 g/dL   HCT 40.6  39.0 - 52.0 %   MCV 95.8  78.0 - 100.0 fL   MCH 32.8  26.0 - 34.0 pg   MCHC 34.2  30.0 - 36.0 g/dL   RDW 13.5  11.5 - 15.5 %   Platelets 391  150 - 400 K/uL  CREATININE, SERUM     Status: Abnormal   Collection Time    07/26/14 10:43 PM      Result Value Ref Range   Creatinine, Ser 1.12  0.50 - 1.35 mg/dL   GFR calc non Af Amer 72 (*) >90 mL/min   GFR calc Af Amer 83 (*) >90 mL/min   Comment: (NOTE)     The eGFR has been calculated using the CKD EPI equation.     This calculation has not been validated in all clinical situations.     eGFR's persistently <90 mL/min signify possible Chronic Kidney     Disease.  BASIC METABOLIC PANEL     Status: Abnormal   Collection Time    07/27/14  7:10 AM      Result Value Ref Range   Sodium 137  137 - 147 mEq/L   Potassium 4.3  3.7 - 5.3 mEq/L   Chloride 100  96 - 112 mEq/L   CO2 24  19 - 32 mEq/L   Glucose, Bld 111 (*) 70 - 99 mg/dL   BUN 13  6 - 23 mg/dL   Creatinine, Ser 1.23  0.50 - 1.35 mg/dL   Calcium 8.6  8.4 - 10.5 mg/dL   GFR calc non Af Amer 64 (*) >90 mL/min   GFR calc Af Amer 74 (*) >90 mL/min   Comment: (NOTE)     The eGFR has been calculated using the CKD EPI equation.     This calculation has not been validated in all clinical situations.     eGFR's persistently <90 mL/min signify possible Chronic Kidney     Disease.   Anion gap 13  5 - 15  CBC     Status: Abnormal   Collection Time    07/27/14  7:10 AM      Result Value Ref Range   WBC 4.1  4.0 - 10.5 K/uL   RBC 3.73 (*) 4.22 - 5.81 MIL/uL   Hemoglobin 11.9 (*) 13.0 - 17.0 g/dL   HCT 35.0 (*) 39.0 - 52.0 %   MCV 93.8  78.0 - 100.0 fL   MCH 31.9  26.0 - 34.0 pg   MCHC 34.0  30.0 - 36.0 g/dL   RDW 13.4  11.5 - 15.5 %   Platelets 356  150 - 400 K/uL  BASIC METABOLIC PANEL     Status: Abnormal   Collection Time    07/28/14  5:10 AM      Result Value Ref Range   Sodium 133 (*) 137 - 147 mEq/L    Potassium 4.1  3.7 - 5.3 mEq/L   Chloride 100  96 - 112 mEq/L   CO2 22  19 - 32 mEq/L   Glucose, Bld 94  70 - 99 mg/dL   BUN 8  6 - 23 mg/dL   Creatinine, Ser 1.03  0.50 - 1.35 mg/dL   Calcium 8.3 (*) 8.4 - 10.5 mg/dL   GFR calc non Af Amer 79 (*) >90 mL/min   GFR calc Af Amer >90  >90 mL/min   Comment: (NOTE)     The eGFR has been calculated using the CKD EPI equation.     This calculation has not been validated in all clinical situations.     eGFR's persistently <90 mL/min signify possible Chronic Kidney     Disease.   Anion gap 11  5 - 15  GLUCOSE, CAPILLARY     Status: None   Collection Time    07/28/14  8:00 AM      Result Value Ref Range   Glucose-Capillary 90  70 - 99 mg/dL    Imaging / Studies: Ct Abdomen Pelvis W Contrast  07/26/2014   CLINICAL DATA:  Abdominal pain and vomiting. History of Crohn's disease.  EXAM: CT ABDOMEN AND PELVIS WITH CONTRAST  TECHNIQUE: Multidetector CT imaging of the abdomen and pelvis was performed using the standard protocol following bolus administration of intravenous contrast.  CONTRAST:  32m OMNIPAQUE IOHEXOL 300 MG/ML SOLN, 1020mOMNIPAQUE IOHEXOL 300 MG/ML SOLN  COMPARISON:  02/05/2012  FINDINGS: Lung bases are clear. No evidence for free air. Gallbladder appears to be surgically removed. Mild dilatation of the biliary system appears unchanged. No acute abnormality within the liver and the portal venous system is patent. There is a stable dense or hypervascular structure along the inferior spleen that measures 1 cm and likely an incidental finding such as a hemangioma. Stable dilatation of the main pancreatic duct measuring up to 5 mm. Incidentally, the patient has a retro aortic left renal vein. Normal appearance of the adrenal glands. No acute abnormality to the kidneys. Question small left renal cortical  cysts.  There is surgical mesh material along the right lower abdominal wall. There is contrast within the ureters and urinary bladder. No  gross abnormality to the prostate or bladder.  There appears to be an anastomosis near the rectum. There is decompressed bowel extending cephalad from the rectum. There are dilated loops of bowel in the pelvis, mid abdomen and right abdomen. Majority of the dilated loops appear to represent small bowel but there is indeterminate dilated loop in the right lower quadrant which has been chronically dilated. This chronically dilated loop of bowel in the right lower quadrant is adjacent to the right lower quadrant ostomy. The ostomy does not appear to be distended.  The patient has a chronic left lateral abdominal hernia containing small bowel loops. This hernia sac contains loops of bowel which contain oral contrast. However, there are fluid-filled structures within this hernia sac which do not contain contrast but these may also represent small bowel. There appears to some soft tissue edema within this hernia sac. Difficult to exclude fluid within the left abdominal hernia.  Again noted are large mesenteric lymph nodes in the right lower quadrant which are similar to the previous examinations. There is a small right lateral hernia sac best seen on the delayed images, sequence 7, image 311 containing a loop of bowel. No acute bone abnormality.  IMPRESSION: The patient's bowel anatomy is complex due to previous surgery. However, there is significant bowel distension, particularly in the right abdomen. Findings are concerning for a bowel obstruction. A transition point is not clearly identified but the obstruction appears to be distal. There is mild stranding in the right lower quadrant but this appears chronic.  Patient has a chronic left abdominal wall hernia containing loops of small bowel. Difficult to differentiate fluid-filled bowel from possible fluid collections within this hernia. Although there are some edematous changes within this left abdominal hernia, this does not appear to be causing obstruction.  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  Stable hypervascular or dense structure in the spleen.  These results were called by telephone at the time of interpretation on 07/26/2014 at 3:54 pm to Dr. Milton Ferguson , who verbally acknowledged these results.   Electronically Signed   By: Markus Daft M.D.   On: 07/26/2014 16:10   Dg Abd 2 Views  07/28/2014   CLINICAL DATA:  Partial small bowel obstruction  EXAM: ABDOMEN - 2 VIEW  COMPARISON:  07/27/2014  FINDINGS: Scattered large and small bowel gas is noted. There again noted changes consistent with an anterior abdominal wall hernia on left. Contrast material from the recent CT is noted within the right lower quadrant. No significant dilatation is seen. No free air is noted.  IMPRESSION: Resolution of small bowel dilatation.   Electronically Signed   By: Inez Catalina M.D.   On: 07/28/2014 08:54   Dg Abd Portable 1v  07/27/2014   CLINICAL DATA:  Abdominal pain.  Bowel obstruction.  EXAM: PORTABLE ABDOMEN - 1 VIEW  COMPARISON:  CT scan dated 07/26/2014  FINDINGS: There are multiple dilated loops of bowel throughout the abdomen. There is a small amount of CT contrast in the ostomy bag. There is residual contrast in the ureters and bladder and the prior CT scan. No osseous abnormality.  IMPRESSION: Persistent partial bowel obstruction.   Electronically Signed   By: Rozetta Nunnery M.D.   On: 07/27/2014 07:47    Medications / Allergies:  Scheduled Meds: . azaTHIOprine  25 mg Oral  QHS  . azaTHIOprine  50 mg Oral Daily  . feeding supplement (RESOURCE BREEZE)  1 Container Oral TID BM  . heparin  5,000 Units Subcutaneous 3 times per day  . mirtazapine  15 mg Oral QHS   Continuous Infusions:  PRN Meds:.guaiFENesin-dextromethorphan, HYDROmorphone (DILAUDID) injection, ketorolac, ondansetron (ZOFRAN) IV, ondansetron  Antibiotics: Anti-infectives   None     Assessment/Plan PSBO likely due to adhesions -resolved -agree with soft diet and discharging home  it able to tolerate  Erby Pian, [facility] Pager [phone] Office [phone]  07/28/2014 11:04 AM

## 2014-07-28 NOTE — Discharge Summary (Signed)
PATIENT DETAILS Name: Daniel Howard Age: 58 y.o. Sex: male Date of Birth: [birth year] MRN: [id number]. Admit Date: 07/26/2014 Admitting Physician: Etta Quill, DO [id number], Lonell Grandchild, MD  Recommendations for Outpatient Follow-up:  1. Follow up with Gastroenterology and surgery as needed. 2. Minimize narcotic use to promote continued bowel motility 3. Follow up and BMET in 1 week at PCP office   PRIMARY DISCHARGE DIAGNOSIS:     Partial Small Bowel Obstruction in the setting of Crohns disease & multiple previous abd. Surgeries. -07/27/14 Abdominal X-ray demonstrated partial bowel obstruction; per surgery, not a good surgical candidate.  -07/28/14 Abdominal X-ray demonstrates resolution of small bowel dilation.  -07/28/14 Transitioned to full liquid diet for breakfast and to soft solids for lunch. Tolerated both well.   -Resume po home meds azathioprine and mirtazapine.  -Eliminate dilaudid to promote GI movement; NSAIDS and antiemetics as needed.  Recommend weaning off all narcotics outpatient.  -appreciate surgical consultation--agreed with non-operative management  Mild Hyponatremia and Hypocalcemia  -secondary to hypotonic  IVF, d/c IVF as pt is tolerating diet -Will follow electrolytes outpatient.   Crohns Disease  -Resumed home regimen azathioprine on 8/7.  Mild hypoglycemia  -Likely do to poor by mouth intake.  Management with 12 hours of D5 NS while NPO -No history of diabetes mellitus  -No further episodes of hypoglycemia.  Severe malnutrition -Appreciate nutrition recommendations -Will d/c with Rx for ensure TID WC   PAST MEDICAL HISTORY: Past Medical History  Diagnosis Date  . Anxiety   . Low back pain   . 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 Abnormal finding of biliary tract     MRCP shows  pancreatic/biliary tract dilation. EUS 2010 confirmed dilation but no chronic pancreaitis or mass. Vascular ectasia crimpoing distal CBD.   [name] Spigelian hernia     bilateral  . Duodenal ulcer 2010    nsaids  . Peristomal hernia   . Small bowel obstruction    DISCHARGE MEDICATIONS:   Medication List         azaTHIOprine 50 MG tablet  Commonly known as:  IMURAN  TAKE 1 TABLET IN MORNING AND 1/2 IN EVENING     CALCIUM 1200 PO  Take 1,200 mg by mouth daily.     feeding supplement (RESOURCE BREEZE) Liqd  Take 1 Container by mouth 3 (three) times daily with meals.     ferrous gluconate 325 MG tablet  Commonly known as:  FERGON  Take 325 mg by mouth daily with breakfast.     HYDROcodone-acetaminophen 5-325 MG per tablet  Commonly known as:  NORCO  One tablet every 12 hours only if needed for severe pain.     mirtazapine 15 MG tablet  Commonly known as:  REMERON  Take 1 tablet (15 mg total) by mouth at bedtime. For appetite     multivitamins ther. w/minerals Tabs tablet  Take 1 tablet by mouth daily.       ALLERGIES:   Allergies  Allergen Reactions  . Penicillins Hives   BRIEF HPI:  See H&P, Labs, Consult and Test reports for all details in brief, patient was admitted for small bowel obstruction. Patient claimed a 2-3 day history of generalized abdominal pain, decreased stool and gas output into his ileostomy bag, mild nausea without emesis. CT scan demonstratred small bowel obstruction, case was discussed with patient's gastroenterologist  who requested hospitalist service to admit the patient.   CONSULTATIONS:   general surgery  PERTINENT RADIOLOGIC STUDIES: Ct Abdomen Pelvis W Contrast 07/26/2014   CLINICAL DATA:  Abdominal pain and vomiting. History of Crohn's disease.  EXAM: CT ABDOMEN AND PELVIS WITH CONTRAST  TECHNIQUE: Multidetector CT imaging of the abdomen and pelvis was performed using the standard protocol following bolus administration of intravenous contrast.   CONTRAST:  88m OMNIPAQUE IOHEXOL 300 MG/ML SOLN, 109mOMNIPAQUE IOHEXOL 300 MG/ML SOLN  COMPARISON:  02/05/2012  FINDINGS: Lung bases are clear. No evidence for free air. Gallbladder appears to be surgically removed. Mild dilatation of the biliary system appears unchanged. No acute abnormality within the liver and the portal venous system is patent. There is a stable dense or hypervascular structure along the inferior spleen that measures 1 cm and likely an incidental finding such as a hemangioma. Stable dilatation of the main pancreatic duct measuring up to 5 mm. Incidentally, the patient has a retro aortic left renal vein. Normal appearance of the adrenal glands. No acute abnormality to the kidneys. Question small left renal cortical cysts.  There is surgical mesh material along the right lower abdominal wall. There is contrast within the ureters and urinary bladder. No gross abnormality to the prostate or bladder.  There appears to be an anastomosis near the rectum. There is decompressed bowel extending cephalad from the rectum. There are dilated loops of bowel in the pelvis, mid abdomen and right abdomen. Majority of the dilated loops appear to represent small bowel but there is indeterminate dilated loop in the right lower quadrant which has been chronically dilated. This chronically dilated loop of bowel in the right lower quadrant is adjacent to the right lower quadrant ostomy. The ostomy does not appear to be distended.  The patient has a chronic left lateral abdominal hernia containing small bowel loops. This hernia sac contains loops of bowel which contain oral contrast. However, there are fluid-filled structures within this hernia sac which do not contain contrast but these may also represent small bowel. There appears to some soft tissue edema within this hernia sac. Difficult to exclude fluid within the left abdominal hernia.  Again noted are large mesenteric lymph nodes in the right lower quadrant  which are similar to the previous examinations. There is a small right lateral hernia sac best seen on the delayed images, sequence 7, image 311 containing a loop of bowel. No acute bone abnormality.  IMPRESSION: The patient's bowel anatomy is complex due to previous surgery. However, there is significant bowel distension, particularly in the right abdomen. Findings are concerning for a bowel obstruction. A transition point is not clearly identified but the obstruction appears to be distal. There is mild stranding in the right lower quadrant but this appears chronic.  Patient has a chronic left abdominal wall hernia containing loops of small bowel. Difficult to differentiate fluid-filled bowel from possible fluid collections within this hernia. Although there are some edematous changes within this left abdominal hernia, this does not appear to be causing obstruction.  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  Stable hypervascular or dense structure in the spleen.  These results were called by telephone at the time of interpretation on 07/26/2014 at 3:54 pm to Dr. JOMilton Ferguson who verbally acknowledged these results.   Electronically Signed   By: AdMarkus Daft.D.   On: 07/26/2014 16:10   Dg Abd 2 Views 07/28/2014   CLINICAL DATA:  Partial small bowel obstruction  EXAM: ABDOMEN - 2 VIEW  COMPARISON:  07/27/2014  FINDINGS: Scattered large and small bowel gas is noted. There again noted changes consistent with an anterior abdominal wall hernia on left. Contrast material from the recent CT is noted within the right lower quadrant. No significant dilatation is seen. No free air is noted.  IMPRESSION: Resolution of small bowel dilatation.   Electronically Signed   By: Inez Catalina M.D.   On: 07/28/2014 08:54   Dg Abd Portable 1v 07/27/2014   CLINICAL DATA:  Abdominal pain.  Bowel obstruction.  EXAM: PORTABLE ABDOMEN - 1 VIEW  COMPARISON:  CT scan dated 07/26/2014  FINDINGS: There are multiple dilated loops  of bowel throughout the abdomen. There is a small amount of CT contrast in the ostomy bag. There is residual contrast in the ureters and bladder and the prior CT scan. No osseous abnormality.  IMPRESSION: Persistent partial bowel obstruction.   Electronically Signed   By: Rozetta Nunnery M.D.   On: 07/27/2014 07:47   PERTINENT LAB RESULTS: CBC:  Recent Labs  07/26/14 2243 07/27/14 0710  WBC 5.8 4.1  HGB 13.9 11.9*  HCT 40.6 35.0*  PLT 391 356   CMET CMP     Component Value Date/Time   NA 133* 07/28/2014 0510   K 4.1 07/28/2014 0510   CL 100 07/28/2014 0510   CO2 22 07/28/2014 0510   GLUCOSE 94 07/28/2014 0510   BUN 8 07/28/2014 0510   CREATININE 1.03 07/28/2014 0510   CREATININE 1.21 06/09/2014 0855   CALCIUM 8.3* 07/28/2014 0510   PROT 7.7 07/26/2014 1255   ALBUMIN 3.0* 07/26/2014 1255   AST 14 07/26/2014 1255   ALT 5 07/26/2014 1255   ALKPHOS 58 07/26/2014 1255   BILITOT 0.7 07/26/2014 1255   GFRNONAA 79* 07/28/2014 0510   GFRAA >90 07/28/2014 0510   GFR Estimated Creatinine Clearance: 65 ml/min (by C-G formula based on Cr of 1.03).  Recent Labs  07/26/14 1255  TSH 0.603   BRIEF HOSPITAL COURSE:  Patient is a 58 year-old man with PMH significant for Crohns Disease, multiple ventral hernia surgeries, multiple small bowel obstructions and ileostomy who was admitted for small bowel obstruction. Patient claimed a 2-3 day history of generalized abdominal pain, decreased stool and gas output into his ileostomy bag and mild nausea without emesis prior to hospital admission. CT scan demonstratred small bowel obstruction and case was discussed with patient's gastroenterologist who requested that the hospitalist service admit the patient. Patient was transferred to [facility] from [facility] upon patient request. Surgery was consulted but determined patient was not a good surgical candidate given his complex GI history. Patient was kept NPO, was given IVF and electrolytes were followed. Patient became  slightly hyponatremic and hypocalcemic but this was likely due to dilution given his NPO status and IVF treatments. Ambulation was encouraged and patient was capable of walking. Abdominal x-rays were performed each morning to assess the status of the obstruction. On 8/7, obstruction cleared and patient's pain was completely resolved. He was transitioned from NPO to clear liquids on 8/6 and then to full liquids for breakfast 8/7 and to soft diet for lunch on 8/7. Patient tolerated this transition, is in no pain and is ready to be discharged home.   TODAY-DAY OF DISCHARGE:  Subjective:   Caysen Whang has no headache, no dizziness, no chest pain, no palpitations, no abdominal pain, no nausea or vomiting, no new weakness tingling or numbness, feels much better, and is  tolerating a soft diet.   Objective:   Blood pressure 89/50, pulse 70, temperature 98.8 F (37.1 C), temperature source Oral, resp. rate 18, height 5' 4.5" (1.638 m), weight 57.4 kg (126 lb 8.7 oz), SpO2 98.00%.  Intake/Output Summary (Last 24 hours) at 07/28/14 1545 Last data filed at 07/28/14 0457  Gross per 24 hour  Intake   1122 ml  Output      0 ml  Net   1122 ml   Filed Weights   07/26/14 2112 07/27/14 0131 07/28/14 0524  Weight: 58 kg (127 lb 13.9 oz) 58 kg (127 lb 13.9 oz) 57.4 kg (126 lb 8.7 oz)   Exam Gen Exam: Awake and alert, Oriented *3, No new F.N deficits, Normal affect, clear speech.   Neck: Supple, No JVD, No cervical lymphadenopathy appreciated.  Respiratory: Symmetrical Chest wall movement, Good air movement bilaterally, CTAB; no wheezes, rales or rhonci  Cardiovascular: RRR, S1 S2 Regular, no murmurs, gallops or rubs  Abdomen: soft,+ BS, non-tender, non distended. No organomegaly appreciated, No rebound -guarding or rigidity. Multiple scars noted from previous surgical operations. Ostomy site without signs of infection or ulceration.  Liquid stool in ostomy bag. Extremities: no edema, lower extremities  warm to touch. Moves all extremities. No Cyanosis, Clubbing or edema Neurologic: Non Focal.  Skin: No new Rash or bruise  DISCHARGE CONDITION: Stable  DISPOSITION: Home  DISCHARGE INSTRUCTIONS:    Activity:  As tolerated   Diet recommendation: Cautiously progress to Regular diet. Drink lots of fluids.  Total Time spent on discharge equals 45 minutes.  Signed: Frederico Hamman, [place] [phone] 07/28/2014 3:45 PM  **Disclaimer: This note may have been dictated with voice recognition software. Similar sounding words can inadvertently be transcribed and this note may contain transcription errors which may not have been corrected upon publication of note.**  Attending  Patient was seen, examined,treatment plan was discussed with the Physician extender. I have directly reviewed the clinical findings, lab, imaging studies and management of this patient in detail. I have made the necessary changes to the above noted documentation, and agree with the documentation, as recorded by the Physician extender. Orson Eva, DO [phone]

## 2014-07-28 NOTE — Progress Notes (Signed)
Chart reviewed  Discussed with NP Agree with plan  Leighton Ruff. Redmond Pulling, MD, FACS General, Bariatric, & Minimally Invasive Surgery [facility], [place]

## 2014-08-04 ENCOUNTER — Encounter (HOSPITAL_COMMUNITY): Payer: Self-pay | Admitting: Pharmacy Technician

## 2014-08-04 ENCOUNTER — Encounter: Payer: Self-pay | Admitting: Internal Medicine

## 2014-08-04 ENCOUNTER — Ambulatory Visit (INDEPENDENT_AMBULATORY_CARE_PROVIDER_SITE_OTHER): Payer: Medicare Other | Admitting: Internal Medicine

## 2014-08-04 ENCOUNTER — Other Ambulatory Visit: Payer: Self-pay | Admitting: Internal Medicine

## 2014-08-04 ENCOUNTER — Encounter (INDEPENDENT_AMBULATORY_CARE_PROVIDER_SITE_OTHER): Payer: Self-pay

## 2014-08-04 VITALS — BP 88/54 | HR 70 | Temp 97.2°F | Ht 64.0 in | Wt 132.6 lb

## 2014-08-04 DIAGNOSIS — K50912 Crohn's disease, unspecified, with intestinal obstruction: Secondary | ICD-10-CM

## 2014-08-04 DIAGNOSIS — K5090 Crohn's disease, unspecified, without complications: Secondary | ICD-10-CM | POA: Diagnosis not present

## 2014-08-04 DIAGNOSIS — K56609 Unspecified intestinal obstruction, unspecified as to partial versus complete obstruction: Secondary | ICD-10-CM

## 2014-08-04 MED ORDER — PEG 3350-KCL-NA BICARB-NACL 420 G PO SOLR: 2000.0000 mL | ORAL | Status: DC

## 2014-08-04 NOTE — Patient Instructions (Signed)
Schedule colonoscopy (through rectum and ostomy)  With sedation.  Continue Imuran  Further recommendations to follow

## 2014-08-04 NOTE — Progress Notes (Signed)
Primary Care Physician:  Vic Blackbird, MD Primary Gastroenterologist:  Dr. Gala Romney  Pre-Procedure History & Physical: HPI:  Daniel Howard is a 58 y.o. male here for followup of a competent Crohn's disease status post multiple surgeries. Recently admitted with a small bowel obstruction which resolved conservatively. I previously reviewed all the CTs with Dr. Thornton Papas. He is overdue for colorectal cancer screening. He has good bit of sigmoid: Left along with his rectum. He may have some transverse colon remaining as well. He needs to have a limited colonoscopy via rectum look into his colostomy. 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 No abdominal pain now, no nausea or vomiting Dr. [name] started Remeron for weight loss. He is actually up 1.6 pounds. He is back on azathioprine.  Last labs during his hospitalization 07/27/2014 white count 4.1 H&H 11.9 and 35.0 BUN 8 creatinine 1.03. TSH 0.603  Past Medical History  Diagnosis Date  . Anxiety   . Low back pain   . 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 Abnormal finding of biliary tract     MRCP shows pancreatic/biliary tract dilation. EUS 2010 confirmed dilation but no chronic pancreaitis or mass. Vascular ectasia crimpoing distal CBD.   [name] Spigelian hernia     bilateral  . Duodenal ulcer 2010    nsaids  . Peristomal hernia   . Small bowel obstruction     Past Surgical History  Procedure Laterality Date  . Colostomy  1992    transverse loop colostomy secondary to a stricture  . Hemicoloectomy w/ anastomosis  1993    R- Dr.DeMason   . Hernia repair  1996    incarcerated periostial hernia  . Esophagogastroduodenoscopy  05/2009    SLF: multiple antral erosions, large ulcer at ansatomosis (postsurgical changes at duodenal bulb and  second portion of duodenum) BX c/x NSAIDS.  [name] Flexible sigmoidoscopy  1988    Dr. Laural Golden- suggested rohn's disease but the biopsies were not collaborative.  [name] Appendectomy  1992  . Eus  10/04/2009    Dr. Estill Bakes dilated CBD and main pancreatic duct.  No pancreatic    Prior to Admission medications   Medication Sig Start Date End Date Taking? Authorizing Provider  azaTHIOprine (IMURAN) 50 MG tablet TAKE 1 TABLET IN MORNING AND 1/2 IN EVENING 05/19/14  Yes Mahala Menghini, PA-C  Calcium Carbonate-Vit D-Min (CALCIUM 1200 PO) Take 1,200 mg by mouth daily.    Yes Historical Provider, MD  feeding supplement, RESOURCE BREEZE, (RESOURCE BREEZE) LIQD Take 1 Container by mouth 3 (three) times daily with meals. 07/28/14  Yes Bobby Rumpf York, PA-C  ferrous gluconate (FERGON) 325 MG tablet Take 325 mg by mouth daily with breakfast.     Yes Historical Provider, MD  HYDROcodone-acetaminophen (NORCO) 5-325 MG per tablet One tablet every 12 hours only if needed for severe pain. 07/28/14  Yes Marianne L York, PA-C  mirtazapine (REMERON) 15 MG tablet Take 1 tablet (15 mg total) by mouth at bedtime. For appetite 06/09/14  Yes Alycia Rossetti, MD  Multiple Vitamins-Minerals (MULTIVITAMINS THER. W/MINERALS) TABS Take 1 tablet by mouth daily.     Yes Historical Provider, MD    Allergies as of 08/04/2014 - Review Complete 07/26/2014  Allergen Reaction Noted  . Penicillins Hives 01/25/2007    Family History  Problem  Relation Age of Onset  . Cancer Father     prostate   . Prostate cancer Father   . Hypertension Sister   . Cancer Sister   . Depression Sister   . Breast cancer Sister   . COPD Sister   . Aneurysm Brother     deceased, brain aneurysm    History   Social History  . Marital Status: Married    Spouse Name: [name]    Number of Children: 44  . Years of Education: [name]   Occupational History  . disabled    Social History Main Topics  . Smoking status: Former Smoker -- 0.50 packs/day for 2  years    Types: Cigarettes    Quit date: 12/21/2009  . Smokeless tobacco: Never Used  . Alcohol Use: No     Comment: Former drinker  . Drug Use: No  . Sexual Activity: Yes    Partners: Female   Other Topics Concern  . Not on file   Social History Narrative  . No narrative on file    Review of Systems: See HPI, otherwise negative ROS  Physical Exam: BP 88/54  Pulse 70  Temp(Src) 97.2 F (36.2 C) (Oral)  Ht 5' 4"  (1.626 m)  Wt 132 lb 9.6 oz (60.147 kg)  BMI 22.75 kg/m2 General:   Alert,  pleasant conversant appears at baseline. Accompanied by spouse.  Skin:  Intact without significant lesions or rashes. Eyes:  Sclera clear, no icterus.   Conjunctiva pink. Ears:  Normal auditory acuity. Nose:  No deformity, discharge,  or lesions. Mouth:  No deformity or lesions. Neck:  Supple; no masses or thyromegaly. No significant cervical adenopathy. Lungs:  Clear throughout to auscultation.   No wheezes, crackles, or rhonchi. No acute distress. Heart:  Regular rate and rhythm; no murmurs, clicks, rubs,  or gallops. Abdomen: Ostomy right lower quadrant with some prolapsing mucosa. Multiple surgical scars.  He has a left abdominal wall hernia which is not reducible. This is chronic. Abdomen is soft and nontender. Pulses:  Normal pulses noted. Extremities:  Without clubbing or edema. Rectal exam is deferred until time of colonoscopy Impression / Recommendations:   Pleasant 58 y/o gentleman with complicated Crohn's disease. Recent SBO resolved conservatively, fortunately. He is back to baseline. He needs colorectal cancer screening of his residual colon/rectum. I discussed this approach with the patient patient's wife at length. Plan to do a limited endoscopy through the rectum and then take a look in his stoma in the near future.The risks, benefits, limitations, alternatives and imponderables have been reviewed with the patient. Questions have been answered. All parties are agreeable.         Notice: This dictation was prepared with Dragon dictation along with smaller phrase technology. Any transcriptional errors that result from this process are unintentional and may not be corrected upon review.

## 2014-08-23 ENCOUNTER — Ambulatory Visit (HOSPITAL_COMMUNITY)
Admission: RE | Admit: 2014-08-23 | Discharge: 2014-08-23 | Disposition: A | Discharge status: Home / Self Care | Payer: Medicare Other | Source: Ambulatory Visit | Attending: Internal Medicine | Admitting: Internal Medicine

## 2014-08-23 ENCOUNTER — Encounter (HOSPITAL_COMMUNITY): Payer: Self-pay | Admitting: *Deleted

## 2014-08-23 ENCOUNTER — Encounter (HOSPITAL_COMMUNITY)
Admission: RE | Disposition: A | Discharge status: Home / Self Care | Payer: Self-pay | Source: Ambulatory Visit | Attending: Internal Medicine

## 2014-08-23 DIAGNOSIS — F411 Generalized anxiety disorder: Secondary | ICD-10-CM | POA: Insufficient documentation

## 2014-08-23 DIAGNOSIS — K5000 Crohn's disease of small intestine without complications: Secondary | ICD-10-CM | POA: Diagnosis not present

## 2014-08-23 DIAGNOSIS — K5090 Crohn's disease, unspecified, without complications: Secondary | ICD-10-CM

## 2014-08-23 DIAGNOSIS — Z980 Intestinal bypass and anastomosis status: Secondary | ICD-10-CM | POA: Diagnosis not present

## 2014-08-23 DIAGNOSIS — D1330 Benign neoplasm of unspecified part of small intestine: Secondary | ICD-10-CM | POA: Diagnosis not present

## 2014-08-23 DIAGNOSIS — K56609 Unspecified intestinal obstruction, unspecified as to partial versus complete obstruction: Secondary | ICD-10-CM | POA: Diagnosis not present

## 2014-08-23 DIAGNOSIS — Z880 Allergy status to penicillin: Secondary | ICD-10-CM | POA: Diagnosis not present

## 2014-08-23 DIAGNOSIS — D126 Benign neoplasm of colon, unspecified: Secondary | ICD-10-CM | POA: Diagnosis not present

## 2014-08-23 DIAGNOSIS — K5289 Other specified noninfective gastroenteritis and colitis: Secondary | ICD-10-CM | POA: Insufficient documentation

## 2014-08-23 DIAGNOSIS — K624 Stenosis of anus and rectum: Secondary | ICD-10-CM | POA: Insufficient documentation

## 2014-08-23 DIAGNOSIS — Z9049 Acquired absence of other specified parts of digestive tract: Secondary | ICD-10-CM | POA: Diagnosis not present

## 2014-08-23 DIAGNOSIS — K50912 Crohn's disease, unspecified, with intestinal obstruction: Secondary | ICD-10-CM

## 2014-08-23 HISTORY — PX: COLONOSCOPY: SHX5424

## 2014-08-23 SURGERY — COLONOSCOPY
Anesthesia: Moderate Sedation

## 2014-08-23 MED ORDER — MEPERIDINE HCL 100 MG/ML IJ SOLN
INTRAMUSCULAR | Status: AC
  Filled 2014-08-23: qty 2

## 2014-08-23 MED ORDER — ONDANSETRON HCL 4 MG/2ML IJ SOLN
INTRAMUSCULAR | Status: DC | PRN
  Administered 2014-08-23: 4 mg via INTRAVENOUS

## 2014-08-23 MED ORDER — ONDANSETRON HCL 4 MG/2ML IJ SOLN
INTRAMUSCULAR | Status: AC
  Filled 2014-08-23: qty 2

## 2014-08-23 MED ORDER — STERILE WATER FOR IRRIGATION IR SOLN
Status: DC | PRN
  Administered 2014-08-23: 14:00:00

## 2014-08-23 MED ORDER — MEPERIDINE HCL 100 MG/ML IJ SOLN
INTRAMUSCULAR | Status: DC | PRN
Start: 2014-08-23 — End: 2014-08-23
  Administered 2014-08-23 (×2): 50 mg via INTRAVENOUS
  Administered 2014-08-23: 25 mg via INTRAVENOUS

## 2014-08-23 MED ORDER — SODIUM CHLORIDE 0.9 % IV SOLN
INTRAVENOUS | Status: DC
  Administered 2014-08-23: 1000 mL via INTRAVENOUS

## 2014-08-23 MED ORDER — MIDAZOLAM HCL 5 MG/5ML IJ SOLN
INTRAMUSCULAR | Status: DC | PRN
  Administered 2014-08-23: 1 mg via INTRAVENOUS
  Administered 2014-08-23 (×2): 2 mg via INTRAVENOUS

## 2014-08-23 MED ORDER — MIDAZOLAM HCL 5 MG/5ML IJ SOLN
INTRAMUSCULAR | Status: AC
  Filled 2014-08-23: qty 10

## 2014-08-23 NOTE — Op Note (Signed)
NAME:  Daniel Howard, Daniel Howard               ACCOUNT NO.:  [id number]  MEDICAL RECORD NO.:  [id number]  LOCATION:  [facility]                          FACILITY:  [facility]  PHYSICIAN:  R. Garfield Cornea, MD FACP FACGDATE OF BIRTH:  1956/05/13  DATE OF PROCEDURE:  08/23/2014 DATE OF DISCHARGE:                              OPERATIVE REPORT   PROCEDURE PERFORMED:  Proctoscopy.  INDICATIONS FOR PROCEDURE:  A 58 year old gentleman with complicated history of Crohn disease, has not had a recent colonoscopy.  Reviewed CT scans with Dr. Jake Seats.  He has had multiple surgeries.  He has a significant amount of sigmoid colon left and may have a portion of colon in continuity with the ostomy.  He does not stool through his rectum. He has no output.  His ostomy is functioning very well.  Clinically, he is doing very on Imuran.  An attempt is being made today at colorectal cancer screening as he is felt to have some residual colon which is at risk.  Risks, benefits, limitations, alternatives, imponderables have been discussed, questions answered.  All parties agreeable.  PROCEDURE NOTE:  Digital rectal exam revealed some anal stenosis, endoscopic findings, the anus ended in the blind pouch within about 3 cm.  He really do not have any rectal mucosa, although there did appear to be a pin pint fistulous opening at the base of the blind pouch.  I made no attempts to go across this in any way.  I subsequently withdrew the scope and advanced it into the ostomy; I immediately entered a somewhat dilated, inflamed appearing small bowel with cobblestoning and ulcers.  There were couple of nearly 1 cm inflammatory appearing polyps present  I did not see any colonic mucosa anywhere.  I biopsied the polypoid lesion and the surrounding inflamed mucosa separately.  The patient tolerated the procedure well with reactive endoscopy.  IMPRESSION:  Limited proctoscopy with possible fistulous opening in the base of rectal/anal stump.     Inflamed small bowel in continuity with the ostomy, cobblestoning, and ulceration of the distal 10-15 cm of a what appeared to be small bowel with small bowel polyp status post mucosal biopsy and biopsy of polyp.  RECOMMENDATIONS: 1. Continue current medical regimen. 2. Follow up on pathology. 3. Further recommendations to follow. The patient appears to have significant amount of colon left on CT scan; however, it was not accessible endoscopically today. Clinically, pt had minimally GI symptoms although weight loss is concerning. He certainly does not have a deep remission based on endoscopic findings today.  We'll followup on pathology. He may need more intensive therapy directed at his Crohn's. He may need to her share referral.     R. Garfield Cornea, MD [facility]     RMR/MEDQ  D:  08/23/2014  T:  08/23/2014  Job:  [id number]  cc:   Vic Blackbird, MD

## 2014-08-23 NOTE — Discharge Instructions (Addendum)
I did not see any of your colon. I did see the end of your small intestine which appeared inflamed. I did take biopsies.  Standard Post enteroscopy instructions  Resume diet; resume usual medications.  Resume usual care of ostomy. You may pass some fresh blood over the next 24 hours but this should subside  Further recommendations to follow pending review of pathology report  Colonoscopy, Care After Refer to this sheet in the next few weeks. These instructions provide you with information on caring for yourself after your procedure. Your health care provider may also give you more specific instructions. Your treatment has been planned according to current medical practices, but problems sometimes occur. Call your health care provider if you have any problems or questions after your procedure. WHAT TO EXPECT AFTER THE PROCEDURE  After your procedure, it is typical to have the following:  A small amount of blood in your stool.  Moderate amounts of gas and mild abdominal cramping or bloating. HOME CARE INSTRUCTIONS  Do not drive, operate machinery, or sign important documents for 24 hours.  You may shower and resume your regular physical activities, but move at a slower pace for the first 24 hours.  Take frequent rest periods for the first 24 hours.  Walk around or put a warm pack on your abdomen to help reduce abdominal cramping and bloating.  Drink enough fluids to keep your urine clear or pale yellow.  You may resume your normal diet as instructed by your health care provider. Avoid heavy or fried foods that are hard to digest.  Avoid drinking alcohol for 24 hours or as instructed by your health care provider.  Only take over-the-counter or prescription medicines as directed by your health care provider.  If a tissue sample (biopsy) was taken during your procedure:  Do not take aspirin or blood thinners for 7 days, or as instructed by your health care provider.  Do not drink  alcohol for 7 days, or as instructed by your health care provider.  Eat soft foods for the first 24 hours. SEEK MEDICAL CARE IF: You have persistent spotting of blood in your stool 2-3 days after the procedure. SEEK IMMEDIATE MEDICAL CARE IF:  You have more than a small spotting of blood in your stool.  You pass large blood clots in your stool.  Your abdomen is swollen (distended).  You have nausea or vomiting.  You have a fever.  You have increasing abdominal pain that is not relieved with medicine. Document Released: 07/22/2004 Document Revised: 09/28/2013 Document Reviewed: 08/15/2013 [facility] Patient Information 2015 [employer], [place]. This information is not intended to replace advice given to you by your health care provider. Make sure you discuss any questions you have with your health care provider.

## 2014-08-23 NOTE — Interval H&P Note (Signed)
History and Physical Interval Note:  08/23/2014 1:58 PM  Daniel Howard  has presented today for surgery, with the diagnosis of CROHNS DISEASE  The various methods of treatment have been discussed with the patient and family. After consideration of risks, benefits and other options for treatment, the patient has consented to  Procedure(s) with comments: [place] ([name]) - 830 -rescheduled 9/2 @ [place] notified pt as a surgical intervention .  The patient's history has been reviewed, patient examined, no change in status, stable for surgery.  I have reviewed the patient's chart and labs.  Questions were answered to the patient's satisfaction.      No change. Colonoscopy per plan.The risks, benefits, limitations, alternatives and imponderables have been reviewed with the patient. Questions have been answered. All parties are agreeable.   Manus Rudd

## 2014-08-23 NOTE — H&P (View-Only) (Signed)
Primary Care Physician:  Vic Blackbird, MD Primary Gastroenterologist:  Dr. Gala Romney  Pre-Procedure History & Physical: HPI:  Daniel Howard is a 58 y.o. male here for followup of a competent Crohn's disease status post multiple surgeries. Recently admitted with a small bowel obstruction which resolved conservatively. I previously reviewed all the CTs with Dr. Thornton Papas. He is overdue for colorectal cancer screening. He has good bit of sigmoid: Left along with his rectum. He may have some transverse colon remaining as well. He needs to have a limited colonoscopy via rectum look into his colostomy. 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 No abdominal pain now, no nausea or vomiting Dr. [name] started Remeron for weight loss. He is actually up 1.6 pounds. He is back on azathioprine.  Last labs during his hospitalization 07/27/2014 white count 4.1 H&H 11.9 and 35.0 BUN 8 creatinine 1.03. TSH 0.603  Past Medical History  Diagnosis Date  . Anxiety   . Low back pain   . 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 Abnormal finding of biliary tract     MRCP shows pancreatic/biliary tract dilation. EUS 2010 confirmed dilation but no chronic pancreaitis or mass. Vascular ectasia crimpoing distal CBD.   [name] Spigelian hernia     bilateral  . Duodenal ulcer 2010    nsaids  . Peristomal hernia   . Small bowel obstruction     Past Surgical History  Procedure Laterality Date  . Colostomy  1992    transverse loop colostomy secondary to a stricture  . Hemicoloectomy w/ anastomosis  1993    R- Dr.DeMason   . Hernia repair  1996    incarcerated periostial hernia  . Esophagogastroduodenoscopy  05/2009    SLF: multiple antral erosions, large ulcer at ansatomosis (postsurgical changes at duodenal bulb and  second portion of duodenum) BX c/x NSAIDS.  [name] Flexible sigmoidoscopy  1988    Dr. Laural Golden- suggested rohn's disease but the biopsies were not collaborative.  [name] Appendectomy  1992  . Eus  10/04/2009    Dr. Estill Bakes dilated CBD and main pancreatic duct.  No pancreatic    Prior to Admission medications   Medication Sig Start Date End Date Taking? Authorizing Provider  azaTHIOprine (IMURAN) 50 MG tablet TAKE 1 TABLET IN MORNING AND 1/2 IN EVENING 05/19/14  Yes Mahala Menghini, PA-C  Calcium Carbonate-Vit D-Min (CALCIUM 1200 PO) Take 1,200 mg by mouth daily.    Yes Historical Provider, MD  feeding supplement, RESOURCE BREEZE, (RESOURCE BREEZE) LIQD Take 1 Container by mouth 3 (three) times daily with meals. 07/28/14  Yes Bobby Rumpf York, PA-C  ferrous gluconate (FERGON) 325 MG tablet Take 325 mg by mouth daily with breakfast.     Yes Historical Provider, MD  HYDROcodone-acetaminophen (NORCO) 5-325 MG per tablet One tablet every 12 hours only if needed for severe pain. 07/28/14  Yes Marianne L York, PA-C  mirtazapine (REMERON) 15 MG tablet Take 1 tablet (15 mg total) by mouth at bedtime. For appetite 06/09/14  Yes Alycia Rossetti, MD  Multiple Vitamins-Minerals (MULTIVITAMINS THER. W/MINERALS) TABS Take 1 tablet by mouth daily.     Yes Historical Provider, MD    Allergies as of 08/04/2014 - Review Complete 07/26/2014  Allergen Reaction Noted  . Penicillins Hives 01/25/2007    Family History  Problem  Relation Age of Onset  . Cancer Father     prostate   . Prostate cancer Father   . Hypertension Sister   . Cancer Sister   . Depression Sister   . Breast cancer Sister   . COPD Sister   . Aneurysm Brother     deceased, brain aneurysm    History   Social History  . Marital Status: Married    Spouse Name: [name]    Number of Children: 72  . Years of Education: [name]   Occupational History  . disabled    Social History Main Topics  . Smoking status: Former Smoker -- 0.50 packs/day for 2  years    Types: Cigarettes    Quit date: 12/21/2009  . Smokeless tobacco: Never Used  . Alcohol Use: No     Comment: Former drinker  . Drug Use: No  . Sexual Activity: Yes    Partners: Female   Other Topics Concern  . Not on file   Social History Narrative  . No narrative on file    Review of Systems: See HPI, otherwise negative ROS  Physical Exam: BP 88/54  Pulse 70  Temp(Src) 97.2 F (36.2 C) (Oral)  Ht 5' 4"  (1.626 m)  Wt 132 lb 9.6 oz (60.147 kg)  BMI 22.75 kg/m2 General:   Alert,  pleasant conversant appears at baseline. Accompanied by spouse.  Skin:  Intact without significant lesions or rashes. Eyes:  Sclera clear, no icterus.   Conjunctiva pink. Ears:  Normal auditory acuity. Nose:  No deformity, discharge,  or lesions. Mouth:  No deformity or lesions. Neck:  Supple; no masses or thyromegaly. No significant cervical adenopathy. Lungs:  Clear throughout to auscultation.   No wheezes, crackles, or rhonchi. No acute distress. Heart:  Regular rate and rhythm; no murmurs, clicks, rubs,  or gallops. Abdomen: Ostomy right lower quadrant with some prolapsing mucosa. Multiple surgical scars.  He has a left abdominal wall hernia which is not reducible. This is chronic. Abdomen is soft and nontender. Pulses:  Normal pulses noted. Extremities:  Without clubbing or edema. Rectal exam is deferred until time of colonoscopy Impression / Recommendations:   Pleasant 58 y/o gentleman with complicated Crohn's disease. Recent SBO resolved conservatively, fortunately. He is back to baseline. He needs colorectal cancer screening of his residual colon/rectum. I discussed this approach with the patient patient's wife at length. Plan to do a limited endoscopy through the rectum and then take a look in his stoma in the near future.The risks, benefits, limitations, alternatives and imponderables have been reviewed with the patient. Questions have been answered. All parties are agreeable.         Notice: This dictation was prepared with Dragon dictation along with smaller phrase technology. Any transcriptional errors that result from this process are unintentional and may not be corrected upon review.

## 2014-08-25 ENCOUNTER — Encounter: Payer: Self-pay | Admitting: Family Medicine

## 2014-08-25 ENCOUNTER — Ambulatory Visit (INDEPENDENT_AMBULATORY_CARE_PROVIDER_SITE_OTHER): Payer: Medicare Other | Admitting: Family Medicine

## 2014-08-25 VITALS — BP 118/66 | HR 72 | Temp 98.0°F | Resp 14 | Ht 66.0 in | Wt 126.0 lb

## 2014-08-25 DIAGNOSIS — K5090 Crohn's disease, unspecified, without complications: Secondary | ICD-10-CM | POA: Diagnosis not present

## 2014-08-25 DIAGNOSIS — D649 Anemia, unspecified: Secondary | ICD-10-CM

## 2014-08-25 DIAGNOSIS — K50819 Crohn's disease of both small and large intestine with unspecified complications: Secondary | ICD-10-CM

## 2014-08-25 DIAGNOSIS — R634 Abnormal weight loss: Secondary | ICD-10-CM | POA: Diagnosis not present

## 2014-08-25 DIAGNOSIS — K5080 Crohn's disease of both small and large intestine without complications: Secondary | ICD-10-CM

## 2014-08-25 DIAGNOSIS — E43 Unspecified severe protein-calorie malnutrition: Secondary | ICD-10-CM

## 2014-08-25 LAB — COMPREHENSIVE METABOLIC PANEL
ALK PHOS: 53 U/L (ref 39–117)
AST: 14 U/L (ref 0–37)
Albumin: 3.4 g/dL — ABNORMAL LOW (ref 3.5–5.2)
BILIRUBIN TOTAL: 0.5 mg/dL (ref 0.2–1.2)
BUN: 10 mg/dL (ref 6–23)
CO2: 31 mEq/L (ref 19–32)
Calcium: 9.1 mg/dL (ref 8.4–10.5)
Chloride: 102 mEq/L (ref 96–112)
Creat: 1.21 mg/dL (ref 0.50–1.35)
GLUCOSE: 114 mg/dL — AB (ref 70–99)
POTASSIUM: 3.9 meq/L (ref 3.5–5.3)
SODIUM: 140 meq/L (ref 135–145)
Total Protein: 6.8 g/dL (ref 6.0–8.3)

## 2014-08-25 LAB — CBC WITH DIFFERENTIAL/PLATELET
BASOS ABS: 0.1 10*3/uL (ref 0.0–0.1)
BASOS PCT: 1 % (ref 0–1)
Eosinophils Absolute: 0.2 10*3/uL (ref 0.0–0.7)
Eosinophils Relative: 4 % (ref 0–5)
HCT: 36.1 % — ABNORMAL LOW (ref 39.0–52.0)
Hemoglobin: 12.1 g/dL — ABNORMAL LOW (ref 13.0–17.0)
LYMPHS ABS: 1.1 10*3/uL (ref 0.7–4.0)
Lymphocytes Relative: 22 % (ref 12–46)
MCH: 32.4 pg (ref 26.0–34.0)
MCHC: 33.5 g/dL (ref 30.0–36.0)
MCV: 96.5 fL (ref 78.0–100.0)
Monocytes Absolute: 0.4 10*3/uL (ref 0.1–1.0)
Monocytes Relative: 8 % (ref 3–12)
NEUTROS PCT: 65 % (ref 43–77)
Neutro Abs: 3.3 10*3/uL (ref 1.7–7.7)
Platelets: 355 10*3/uL (ref 150–400)
RBC: 3.74 MIL/uL — AB (ref 4.22–5.81)
RDW: 14.3 % (ref 11.5–15.5)
WBC: 5.1 10*3/uL (ref 4.0–10.5)

## 2014-08-25 MED ORDER — HYDROCODONE-ACETAMINOPHEN 5-325 MG PO TABS: ORAL_TABLET | ORAL | Status: DC

## 2014-08-25 NOTE — Assessment & Plan Note (Signed)
Will await colonoscopy records. It is noted that he's had some weight loss there may need to be some changes in his medications for his Crohn's disease. His appetite is fairly good. I'll recheck his metabolic panel today

## 2014-08-25 NOTE — Progress Notes (Signed)
Patient ID: Daniel Howard, male   DOB: September 02, 1956, 58 y.o.   MRN: [id number]   Subjective:    Patient ID: Daniel Howard, male    DOB: March 16, 1956, 58 y.o.   MRN: [id number]  Patient presents for Hospital F/U and 6 Month F/U  is here for hospital followup he was admitted secondary to his small bowel obstruction in setting of his Crohn's disease is complicated that is large and small intestine. He was seen by the gastroenterologist recently had a colonoscopy done biopsy still pending operative report it was noted that he had a significant amount of inflammation and there was possible fistula noted. He did have a spell last night where she felt symptoms of small bowel obstruction however it did pass. His medications were reviewed. His last hemoglobin was low but lower than his baseline he is currently taking an iron tablet every other day.  Noted there was some concern whether he was taken to much pain medication he does not take narcotic medication on a daily basis he only uses this if he has a bout of severe abdominal pain therefore I think the discharge summaries and correct thinking that chronic opiates were causing difficulties for him    Review Of Systems:  GEN- denies fatigue, fever, weight loss,weakness, recent illness HEENT- denies eye drainage, change in vision, nasal discharge, CVS- denies chest pain, palpitations RESP- denies SOB, cough, wheeze ABD- denies N/V, change in stools, abd pain GU- denies dysuria, hematuria, dribbling, incontinence MSK- denies joint pain, muscle aches, injury Neuro- denies headache, dizziness, syncope, seizure activity       Objective:    BP 118/66  Pulse 72  Temp(Src) 98 F (36.7 C) (Oral)  Resp 14  Ht 5' 6"  (1.676 m)  Wt 126 lb (57.153 kg)  BMI 20.35 kg/m2 GEN- NAD, alert and oriented x3 HEENT- PERRL, EOMI, non injected sclera, pink conjunctiva, MMM, oropharynx clear,  CVS- RRR, no murmur RESP-CTAB ABS-NABS,soft,NT,ND, Ostomy bag in  place,  EXT- No edema Pulses- Radial 2+      Assessment & Plan:      Problem List Items Addressed This Visit   Crohn's disease of both small and large intestine with complication   CROHN'S DISEASE   Relevant Orders      Comprehensive metabolic panel      CBC with Differential   ANEMIA - Primary   Relevant Orders      CBC with Differential      Note: This dictation was prepared with Dragon dictation along with smaller phrase technology. Any transcriptional errors that result from this process are unintentional.

## 2014-08-25 NOTE — Assessment & Plan Note (Signed)
We'll have him try one tablet of iron once a day if this causes any change in his bowel movements he will back back down.

## 2014-08-25 NOTE — Patient Instructions (Signed)
Continue current medications We will call with labs F/U 4 months

## 2014-08-29 ENCOUNTER — Encounter (HOSPITAL_COMMUNITY): Payer: Self-pay | Admitting: Internal Medicine

## 2014-08-31 ENCOUNTER — Encounter: Payer: Self-pay | Admitting: Family Medicine

## 2014-09-04 ENCOUNTER — Other Ambulatory Visit: Payer: Self-pay

## 2014-09-04 DIAGNOSIS — K50819 Crohn's disease of both small and large intestine with unspecified complications: Secondary | ICD-10-CM

## 2014-09-05 NOTE — Progress Notes (Signed)
Referral has been faxed.

## 2014-09-06 NOTE — Progress Notes (Signed)
Patient ID: Daniel Howard, male   DOB: [birth year], 58 y.o.   MRN: [id number] Pt has an appointment at [facility] on September 29, 2014 @ 11:00. Pt ans wife is aware

## 2014-09-11 ENCOUNTER — Encounter (HOSPITAL_COMMUNITY): Payer: Self-pay | Admitting: Emergency Medicine

## 2014-09-11 ENCOUNTER — Emergency Department (HOSPITAL_COMMUNITY)
Admission: EM | Admit: 2014-09-11 | Discharge: 2014-09-11 | Discharge status: Against Medical Advice | Payer: Medicare Other | Attending: Emergency Medicine | Admitting: Emergency Medicine

## 2014-09-11 DIAGNOSIS — R21 Rash and other nonspecific skin eruption: Secondary | ICD-10-CM | POA: Insufficient documentation

## 2014-09-11 DIAGNOSIS — R220 Localized swelling, mass and lump, head: Secondary | ICD-10-CM | POA: Diagnosis not present

## 2014-09-11 DIAGNOSIS — R221 Localized swelling, mass and lump, neck: Secondary | ICD-10-CM

## 2014-09-11 DIAGNOSIS — R0602 Shortness of breath: Secondary | ICD-10-CM | POA: Diagnosis not present

## 2014-09-11 DIAGNOSIS — L723 Sebaceous cyst: Secondary | ICD-10-CM | POA: Insufficient documentation

## 2014-09-11 NOTE — ED Notes (Addendum)
Pt reports dyspnea with ambulation x "couple of months." Denies CP. Denies cough. Denies SOB at this time, lungs clear. Pt also reports multiple "bumps" on bilateral arms and head x AM. Denies itching. Denies abdominal, N/V. PT in NAD.

## 2014-09-15 ENCOUNTER — Telehealth: Payer: Self-pay

## 2014-09-15 NOTE — Telephone Encounter (Addendum)
T/C from Dr. Gala Romney requesting the pt's most recent CT of Abdomen and pelvis ( 07/26/2014) be put on CD and mailed to Dr. Renee Harder along with a note that this is the patient that he discussed with him on Tuesday night ( 09/12/2014) at the [facility] in [place], [place] .   I called and told Bethena Roys in Radiology and she said to fax over all of the info to : ATTN: Neldon Mc to [phone] and she will mail it to Dr. Renee Harder.  The pt's appt with Dr. Renee Harder is 09/29/2014.   This info has been faxed to [employer].

## 2014-09-17 ENCOUNTER — Telehealth: Payer: Self-pay | Admitting: Internal Medicine

## 2014-09-17 NOTE — Telephone Encounter (Signed)
Spoke with  Dr. Koleen Distance at [place] talk in [place] set 22 about pt. I voicd concerns about active disease found at enteroscopy and need to surveill remaining colon.  Pt will be seen by him in the coming weeks.  CT on disc to be sent to him

## 2014-09-25 DIAGNOSIS — D649 Anemia, unspecified: Secondary | ICD-10-CM | POA: Diagnosis not present

## 2014-09-25 LAB — CBC WITH DIFFERENTIAL/PLATELET
BASOS ABS: 0.1 10*3/uL (ref 0.0–0.1)
BASOS PCT: 1 % (ref 0–1)
Eosinophils Absolute: 0.3 10*3/uL (ref 0.0–0.7)
Eosinophils Relative: 4 % (ref 0–5)
HEMATOCRIT: 35.7 % — AB (ref 39.0–52.0)
Hemoglobin: 11.8 g/dL — ABNORMAL LOW (ref 13.0–17.0)
LYMPHS PCT: 18 % (ref 12–46)
Lymphs Abs: 1.3 10*3/uL (ref 0.7–4.0)
MCH: 32.2 pg (ref 26.0–34.0)
MCHC: 33.1 g/dL (ref 30.0–36.0)
MCV: 97.3 fL (ref 78.0–100.0)
MONO ABS: 0.5 10*3/uL (ref 0.1–1.0)
Monocytes Relative: 7 % (ref 3–12)
NEUTROS ABS: 5.2 10*3/uL (ref 1.7–7.7)
Neutrophils Relative %: 70 % (ref 43–77)
PLATELETS: 330 10*3/uL (ref 150–400)
RBC: 3.67 MIL/uL — ABNORMAL LOW (ref 4.22–5.81)
RDW: 13.6 % (ref 11.5–15.5)
WBC: 7.4 10*3/uL (ref 4.0–10.5)

## 2014-09-26 ENCOUNTER — Other Ambulatory Visit: Payer: Self-pay | Admitting: Gastroenterology

## 2014-10-01 ENCOUNTER — Other Ambulatory Visit: Payer: Self-pay | Admitting: Family Medicine

## 2014-10-02 ENCOUNTER — Telehealth: Payer: Self-pay | Admitting: Internal Medicine

## 2014-10-02 NOTE — Telephone Encounter (Signed)
Patient's wife stopped by today to bring [place] [employer] exception verification papers that need to be filled out ASAP and also a letter stating that the wife needs to be with the patient during his appointments.  Per CM the provider that saw the patient last is the one to fill out the papers. I have placed them on RMR's desk. They need to be faxed to [phone].

## 2014-10-02 NOTE — Telephone Encounter (Signed)
Medication refilled per protocol. 

## 2014-10-03 NOTE — Telephone Encounter (Signed)
Paperwork completed and faxed to [employer]

## 2014-10-03 NOTE — Telephone Encounter (Signed)
Daniel Howard is already putting this information together. Please proceed.

## 2014-10-10 ENCOUNTER — Emergency Department (HOSPITAL_COMMUNITY): Payer: Medicare Other

## 2014-10-10 ENCOUNTER — Encounter (HOSPITAL_COMMUNITY): Payer: Self-pay | Admitting: Radiology

## 2014-10-10 ENCOUNTER — Emergency Department (HOSPITAL_COMMUNITY)
Admission: EM | Admit: 2014-10-10 | Discharge: 2014-10-10 | Disposition: A | Discharge status: Home / Self Care | Payer: Medicare Other | Attending: Emergency Medicine | Admitting: Emergency Medicine

## 2014-10-10 DIAGNOSIS — R197 Diarrhea, unspecified: Secondary | ICD-10-CM

## 2014-10-10 DIAGNOSIS — K5090 Crohn's disease, unspecified, without complications: Secondary | ICD-10-CM | POA: Insufficient documentation

## 2014-10-10 DIAGNOSIS — Z880 Allergy status to penicillin: Secondary | ICD-10-CM | POA: Insufficient documentation

## 2014-10-10 DIAGNOSIS — Z87891 Personal history of nicotine dependence: Secondary | ICD-10-CM | POA: Insufficient documentation

## 2014-10-10 DIAGNOSIS — Z0389 Encounter for observation for other suspected diseases and conditions ruled out: Secondary | ICD-10-CM | POA: Diagnosis not present

## 2014-10-10 DIAGNOSIS — K50919 Crohn's disease, unspecified, with unspecified complications: Secondary | ICD-10-CM

## 2014-10-10 DIAGNOSIS — Z9889 Other specified postprocedural states: Secondary | ICD-10-CM | POA: Diagnosis not present

## 2014-10-10 DIAGNOSIS — Z79899 Other long term (current) drug therapy: Secondary | ICD-10-CM | POA: Insufficient documentation

## 2014-10-10 DIAGNOSIS — Z8659 Personal history of other mental and behavioral disorders: Secondary | ICD-10-CM | POA: Diagnosis not present

## 2014-10-10 DIAGNOSIS — K838 Other specified diseases of biliary tract: Secondary | ICD-10-CM | POA: Diagnosis not present

## 2014-10-10 DIAGNOSIS — R109 Unspecified abdominal pain: Secondary | ICD-10-CM | POA: Diagnosis present

## 2014-10-10 LAB — COMPREHENSIVE METABOLIC PANEL
ALBUMIN: 2.3 g/dL — AB (ref 3.5–5.2)
ALT: <5 U/L (ref 0–53)
AST: 15 U/L (ref 0–37)
Alkaline Phosphatase: 51 U/L (ref 39–117)
Anion gap: 11 (ref 5–15)
BUN: 15 mg/dL (ref 6–23)
CALCIUM: 7 mg/dL — AB (ref 8.4–10.5)
CO2: 26 mEq/L (ref 19–32)
Chloride: 102 mEq/L (ref 96–112)
Creatinine, Ser: 1.17 mg/dL (ref 0.50–1.35)
GFR calc non Af Amer: 68 mL/min — ABNORMAL LOW (ref >90)
GFR, EST AFRICAN AMERICAN: 79 mL/min — AB (ref >90)
Glucose, Bld: 71 mg/dL (ref 70–99)
POTASSIUM: 3.7 meq/L (ref 3.7–5.3)
Sodium: 139 mEq/L (ref 137–147)
TOTAL PROTEIN: 6.4 g/dL (ref 6.0–8.3)
Total Bilirubin: 0.7 mg/dL (ref 0.3–1.2)

## 2014-10-10 LAB — CBC WITH DIFFERENTIAL/PLATELET
Basophils Absolute: 0 10*3/uL (ref 0.0–0.1)
Basophils Relative: 1 % (ref 0–1)
EOS ABS: 0.1 10*3/uL (ref 0.0–0.7)
Eosinophils Relative: 2 % (ref 0–5)
HCT: 39.7 % (ref 39.0–52.0)
Hemoglobin: 13.8 g/dL (ref 13.0–17.0)
Lymphocytes Relative: 22 % (ref 12–46)
Lymphs Abs: 1 10*3/uL (ref 0.7–4.0)
MCH: 32.9 pg (ref 26.0–34.0)
MCHC: 34.8 g/dL (ref 30.0–36.0)
MCV: 94.5 fL (ref 78.0–100.0)
Monocytes Absolute: 1 10*3/uL (ref 0.1–1.0)
Monocytes Relative: 22 % — ABNORMAL HIGH (ref 3–12)
Neutro Abs: 2.5 10*3/uL (ref 1.7–7.7)
Neutrophils Relative %: 53 % (ref 43–77)
PLATELETS: 393 10*3/uL (ref 150–400)
RBC: 4.2 MIL/uL — AB (ref 4.22–5.81)
RDW: 12.7 % (ref 11.5–15.5)
WBC: 4.7 10*3/uL (ref 4.0–10.5)

## 2014-10-10 LAB — URINALYSIS, ROUTINE W REFLEX MICROSCOPIC
Glucose, UA: NEGATIVE mg/dL
Hgb urine dipstick: NEGATIVE
Ketones, ur: 15 mg/dL — AB
LEUKOCYTES UA: NEGATIVE
NITRITE: NEGATIVE
PROTEIN: NEGATIVE mg/dL
SPECIFIC GRAVITY, URINE: 1.026 (ref 1.005–1.030)
Urobilinogen, UA: 0.2 mg/dL (ref 0.0–1.0)
pH: 6 (ref 5.0–8.0)

## 2014-10-10 LAB — LIPASE, BLOOD: Lipase: 13 U/L (ref 11–59)

## 2014-10-10 MED ORDER — SODIUM CHLORIDE 0.9 % IV SOLN
INTRAVENOUS | Status: DC
  Administered 2014-10-10: 10:00:00 via INTRAVENOUS

## 2014-10-10 MED ORDER — ONDANSETRON HCL 4 MG/2ML IJ SOLN
4.0000 mg | Freq: Once | INTRAMUSCULAR | Status: AC
  Administered 2014-10-10: 4 mg via INTRAVENOUS
  Filled 2014-10-10: qty 2

## 2014-10-10 MED ORDER — IOHEXOL 300 MG/ML  SOLN
80.0000 mL | Freq: Once | INTRAMUSCULAR | Status: AC | PRN
  Administered 2014-10-10: 80 mL via INTRAVENOUS

## 2014-10-10 MED ORDER — IOHEXOL 300 MG/ML  SOLN: 50.0000 mL | Freq: Once | INTRAMUSCULAR | Status: DC | PRN

## 2014-10-10 MED ORDER — MORPHINE SULFATE 4 MG/ML IJ SOLN
4.0000 mg | Freq: Once | INTRAMUSCULAR | Status: AC
  Administered 2014-10-10: 4 mg via INTRAVENOUS
  Filled 2014-10-10: qty 1

## 2014-10-10 NOTE — ED Notes (Addendum)
Pt hx of small bowel obstruction and abdominal surgery, has RLQ colostomy since 1992. Abdominal pain started yesterday, vomited yesterday. Pain 10/10. Pt reports usually he has gas in colostomy but has not had any recently. Pt reports stool is diarrhea at present. Unsure if pt has swelling to LLQ, left side much larger than right side of abdomen.

## 2014-10-10 NOTE — ED Notes (Signed)
Patient ambulated to bathroom without assistance and with steady gait Patient emptied colostomy bag

## 2014-10-10 NOTE — Discharge Instructions (Signed)
Crohn Disease Crohn disease is a long-term (chronic) soreness and redness (inflammation) of the intestines (bowel). It can affect any portion of the digestive tract, from the mouth to the anus. It can also cause problems outside the digestive tract. Crohn disease is closely related to a disease called ulcerative colitis (together, these two diseases are called inflammatory bowel disease).  CAUSES  The cause of Crohn disease is not known. One Link Snuffer is that, in an easily affected person, the immune system is triggered to attack the body's own digestive tissue. Crohn disease runs in families. It seems to be more common in certain geographic areas and amongst certain races. There are no clear-cut dietary causes.  SYMPTOMS  Crohn disease can cause many different symptoms since it can affect many different parts of the body. Symptoms include:  Fatigue.  Weight loss.  Chronic diarrhea, sometime bloody.  Abdominal pain and cramps.  Fever.  Ulcers or canker sores in the mouth or rectum.  Anemia (low red blood cells).  Arthritis, skin problems, and eye problems may occur. Complications of Crohn disease can include:  Series of holes (perforation) of the bowel.  Portions of the intestines sticking to each other (adhesions).  Obstruction of the bowel.  Fistula formation, typically in the rectal area but also in other areas. A fistula is an opening between the bowels and the outside, or between the bowels and another organ.  A painful crack in the mucous membrane of the anus (rectal fissure). DIAGNOSIS  Your caregiver may suspect Crohn disease based on your symptoms and an exam. Blood tests may confirm that there is a problem. You may be asked to submit a stool specimen for examination. X-rays and CT scans may be necessary. Ultimately, the diagnosis is usually made after a procedure that uses a flexible tube that is inserted via your mouth or your anus. This is done under sedation and is called  either an upper endoscopy or colonoscopy. With these tests, the specialist can take tiny tissue samples and remove them from the inside of the bowel (biopsy). Examination of this biopsy tissue under a microscope can reveal Crohn disease as the cause of your symptoms. Due to the many different forms that Crohn disease can take, symptoms may be present for several years before a diagnosis is made. TREATMENT  Medications are often used to decrease inflammation and control the immune system. These include medicines related to aspirin, steroid medications, and newer and stronger medications to slow down the immune system. Some medications may be used as suppositories or enemas. A number of other medications are used or have been studied. Your caregiver will make specific recommendations. HOME CARE INSTRUCTIONS   Symptoms such as diarrhea can be controlled with medications. Avoid foods that have a laxative effect such as fresh fruit, vegetables, and dairy products. During flare-ups, you can rest your bowel by refraining from solid foods. Drink clear liquids frequently during the day. (Electrolyte or rehydrating fluids are best. Your caregiver can help you with suggestions.) Drink often to prevent loss of body fluids (dehydration). When diarrhea has cleared, eat small meals and more frequently. Avoid food additives and stimulants such as caffeine (coffee, tea, or chocolate). Enzyme supplements may help if you develop intolerance to a sugar in dairy products (lactose). Ask your caregiver or dietitian about specific dietary instructions.  Try to maintain a positive attitude. Learn relaxation techniques such as self-hypnosis, mental imaging, and muscle relaxation.  If possible, avoid stresses which can aggravate your condition.  Exercise  regularly.  Follow your diet.  Always get plenty of rest. SEEK MEDICAL CARE IF:   Your symptoms fail to improve after a week or two of new treatment.  You experience  continued weight loss.  You have ongoing cramps or loose bowels.  You develop a new skin rash, skin sores, or eye problems. SEEK IMMEDIATE MEDICAL CARE IF:   You have worsening of your symptoms or develop new symptoms.  You have a fever.  You develop bloody diarrhea.  You develop severe abdominal pain. MAKE SURE YOU:   Understand these instructions.  Will watch your condition.  Will get help right away if you are not doing well or get worse. Document Released: 09/17/2005 Document Revised: 04/24/2014 Document Reviewed: 08/16/2007 [name] Patient Information 2015 [place], [place]. This information is not intended to replace advice given to you by your health care provider. Make sure you discuss any questions you have with your health care provider.

## 2014-10-10 NOTE — ED Provider Notes (Signed)
CSN: [id number]     Arrival date & time 10/10/14  0160 History   First MD Initiated Contact with Patient 10/10/14 1000     Chief Complaint  Patient presents with  . Abdominal Pain     (Consider location/radiation/quality/duration/timing/severity/associated sxs/prior Treatment) HPI Patient reports onset of abdominal pain yesterday. It was gradual in onset. It is cramping and colicky in nature. It is predominantly central. The patient reports he's having very thin watery output from his colostomy. He did vomit approximately 4 times yesterday. He was able to keep his medications and some water down yesterday evening. He reports he has had some hot cold episodes but not documented fever. Patient denies urinary symptoms. He does not have other acute complaints. Patient has had prior abdominal surgeries with colostomy in 1992 and history small bowel instruction.  Past Medical History  Diagnosis Date  . Anxiety   . Low back pain   . 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 Abnormal finding of biliary tract     MRCP shows pancreatic/biliary tract dilation. EUS 2010 confirmed dilation but no chronic pancreaitis or mass. Vascular ectasia crimpoing distal CBD.   [name] Spigelian hernia     bilateral  . Duodenal ulcer 2010    nsaids  . Peristomal hernia   . Small bowel obstruction    Past Surgical History  Procedure Laterality Date  . Colostomy  1992    transverse loop colostomy secondary to a stricture  . Hemicoloectomy w/ anastomosis  1993    R- Dr.DeMason   . Hernia repair  1996    incarcerated periostial hernia  . Esophagogastroduodenoscopy  05/2009    SLF: multiple antral erosions, large ulcer at ansatomosis (postsurgical changes at duodenal bulb and second portion of duodenum) BX c/x NSAIDS.  [name] Flexible sigmoidoscopy  1988    Dr.  Laural Golden- suggested rohn's disease but the biopsies were not collaborative.  [name] Appendectomy  1992  . Eus  10/04/2009    Dr. Estill Bakes dilated CBD and main pancreatic duct.  No pancreatic  . Colonoscopy [name] 08/23/2014    Procedure: COLONOSCOPY THROUGH RECTUM AND OSTOMY;  Surgeon: Daneil Dolin, MD;  Location: [facility];  Service: Endoscopy;  Laterality: [name];  830 -rescheduled 9/2 @ [place] notified pt   Family History  Problem Relation Age of Onset  . Cancer Father     prostate   . Prostate cancer Father   . Hypertension Sister   . Cancer Sister   . Depression Sister   . Breast cancer Sister   . COPD Sister   . Aneurysm Brother     deceased, brain aneurysm   History  Substance Use Topics  . Smoking status: Former Smoker -- 0.50 packs/day for 2 years    Types: Cigarettes    Quit date: 12/21/2009  . Smokeless tobacco: Never Used  . Alcohol Use: No     Comment: Former drinker    Review of Systems 10 Systems reviewed and are negative for acute change except as noted in the HPI.    Allergies  Penicillins  Home Medications   Prior to Admission medications   Medication Sig Start Date End Date Taking? Authorizing Provider  azaTHIOprine (IMURAN) 50 MG tablet Take 25-50 mg by mouth 2 (two) times daily. Takes 42m in the morning and 216min the evening   Yes  Historical Provider, MD  calcium carbonate (OS-CAL) 600 MG TABS tablet Take 1,200 mg by mouth daily with breakfast.   Yes Historical Provider, MD  Ferrous Sulfate 28 MG TABS Take 1 tablet by mouth daily.   Yes Historical Provider, MD  HYDROcodone-acetaminophen (NORCO/VICODIN) 5-325 MG per tablet Take 1 tablet by mouth every 6 (six) hours as needed for moderate pain.   Yes Historical Provider, MD  mirtazapine (REMERON) 15 MG tablet Take 15 mg by mouth at bedtime.   Yes Historical Provider, MD  Multiple Vitamins-Minerals (CENTRUM SILVER ADULT 50+) TABS Take 1 tablet by mouth daily.   Yes Historical Provider, MD   BP  109/70  Pulse 79  Temp(Src) 97.5 F (36.4 C) (Oral)  Resp 15  Wt 130 lb (58.968 kg)  SpO2 99% Physical Exam  Constitutional: He is oriented to person, place, and time.  The patient is thin and frail cachectic appearing. He is nontoxic alert and interactive.  HENT:  Head: Normocephalic and atraumatic.  Mucous membranes are pink and moist.  Eyes: EOM are normal. Pupils are equal, round, and reactive to light.  Neck: Neck supple.  Cardiovascular: Normal rate, regular rhythm, normal heart sounds and intact distal pulses.   Pulmonary/Chest: Effort normal and breath sounds normal.  Abdominal: Soft. He exhibits distension. There is tenderness.  Patient has a large midline abdominal scar. This is well-healed. There is a colostomy on the right lateral abdomen. There is very thin and colored fluid in it. The patient has hyperactive bowel sounds. He seems distended and tympanitic on the left side of the abdomen. Patient does have moderate tenderness to palpation without guarding.  Musculoskeletal: Normal range of motion. He exhibits no edema.  Neurological: He is alert and oriented to person, place, and time. He has normal strength. Coordination normal. GCS eye subscore is 4. GCS verbal subscore is 5. GCS motor subscore is 6.  Skin: Skin is warm, dry and intact.  Psychiatric: He has a normal mood and affect.    ED Course  Procedures (including critical care time) Labs Review Labs Reviewed  CBC WITH DIFFERENTIAL - Abnormal; Notable for the following:    RBC 4.20 (*)    Monocytes Relative 22 (*)    All other components within normal limits  URINALYSIS, ROUTINE W REFLEX MICROSCOPIC - Abnormal; Notable for the following:    Color, Urine AMBER (*)    Bilirubin Urine SMALL (*)    Ketones, ur 15 (*)    All other components within normal limits  COMPREHENSIVE METABOLIC PANEL - Abnormal; Notable for the following:    Calcium 7.0 (*)    Albumin 2.3 (*)    GFR calc non Af Amer 68 (*)    GFR calc  Af Amer 79 (*)    All other components within normal limits  LIPASE, BLOOD    Imaging Review Ct Abdomen Pelvis W Contrast  10/10/2014   CLINICAL DATA:  Acute generalized abdominal pain.  EXAM: CT ABDOMEN AND PELVIS WITH CONTRAST  TECHNIQUE: Multidetector CT imaging of the abdomen and pelvis was performed using the standard protocol following bolus administration of intravenous contrast.  CONTRAST:  72m OMNIPAQUE IOHEXOL 300 MG/ML  SOLN  COMPARISON:  CT scan of July 26, 2014.  FINDINGS: Minimal subsegmental atelectasis is noted posteriorly in both lung bases. No significant osseous abnormality is noted.  No gallstones are noted. No focal abnormality is noted in the liver. Adrenal glands and kidneys appear normal. No hydronephrosis or renal obstruction is noted. Stable hyperdensity  is noted in the spleen. Stable biliary and pancreatic ductal dilatation is noted. Stable enlarged lymph nodes are noted in the mesentery of the right lower quadrant. Ostomy is noted in the right lower quadrant. Ostomy is noted in the right lower quadrant. Stable ventral hernia is seen in the left lower quadrant of the abdomen which contains loops of bowel, but does not appear to be resulting in obstruction or dilatation.  Severely dilated loops of bowel are noted in the right side of the abdomen extending from the right upper quadrant to right lower quadrant. These do not appear to be significantly changed compared to prior exam. Most of these appear to represent small bowel, but some degree of colonic dilatation cannot be excluded. Transition zone is not clearly identified. The patient is status post hemicolectomy.  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  Right lower quadrant ostomy is again noted.  Stable biliary and pancreatic ductal dilatation is noted compared to prior exam.  Stable ventral hernia is seen in the left lower quadrant of the abdomen containing loops of bowel, but not  resulting in incarceration or obstruction.  Stable enlarged right lower quadrant mesenteric lymph nodes are noted.  Continued presence of severely dilated loops of bowel, believed to represent small bowel, in the right upper and lower quadrants of the abdomen. This is concerning for bowel obstruction, but no definite transition zone is noted. This dilatation is not significantly changed compared to prior exam.   Electronically Signed   By: Sabino Dick M.D.   On: 10/10/2014 12:47     EKG Interpretation   Date/Time:  Tuesday October 10 2014 10:07:58 EDT Ventricular Rate:  85 PR Interval:  135 QRS Duration: 68 QT Interval:  370 QTC Calculation: 440 R Axis:   29 Text Interpretation:  Sinus rhythm Minimal ST elevation, anterolateral  leads Baseline wander in lead(s) V2 no STEMI. no change Confirmed by  Johnney Killian, MD, Jeannie Done [phone]) on 10/10/2014 12:09:59 PM     Consult general surgery: Reviewed with fellow working with Dr. Excell Seltzer. They have reviewed the CT findings and the patient's presentation, at this point in time the CT is not significantly changed from prior scans. By my examination the patient does not have a surgical abdomen. The plan will be for follow up with GI which patient already has scheduled for day after tomorrow. MDM   Final diagnoses:  Diarrhea  Crohn disease, unspecified complication   This point time diagnostic workup does not show any acute changes. The patient does not have leukocytosis. He is afebrile. He does not have a surgical exam without guarding or peritoneal signs. The CT identify some chronic bowel distention without apparent obstruction. The patient does continue to have output from his colostomy. I have reviewed this with general surgery and at this point time these appear to be nonacute findings without current indication for hospitalization. The patient does have follow up with his gastroenterologist on Thursday. He is aware of need to return should he develop  fever worsening changing pain vomiting or other problems.    Charlesetta Shanks, MD 10/10/14 [phone]

## 2014-10-10 NOTE — ED Notes (Signed)
Patient transported to CT 

## 2014-10-12 DIAGNOSIS — R140 Abdominal distension (gaseous): Secondary | ICD-10-CM | POA: Diagnosis not present

## 2014-10-12 DIAGNOSIS — R61 Generalized hyperhidrosis: Secondary | ICD-10-CM | POA: Diagnosis not present

## 2014-10-12 DIAGNOSIS — Z87891 Personal history of nicotine dependence: Secondary | ICD-10-CM | POA: Diagnosis not present

## 2014-10-12 DIAGNOSIS — K50812 Crohn's disease of both small and large intestine with intestinal obstruction: Secondary | ICD-10-CM | POA: Diagnosis not present

## 2014-10-12 DIAGNOSIS — Z862 Personal history of diseases of the blood and blood-forming organs and certain disorders involving the immune mechanism: Secondary | ICD-10-CM | POA: Diagnosis not present

## 2014-10-12 DIAGNOSIS — R42 Dizziness and giddiness: Secondary | ICD-10-CM | POA: Diagnosis not present

## 2014-10-12 DIAGNOSIS — Z933 Colostomy status: Secondary | ICD-10-CM | POA: Diagnosis not present

## 2014-10-12 DIAGNOSIS — R0602 Shortness of breath: Secondary | ICD-10-CM | POA: Diagnosis not present

## 2014-10-12 DIAGNOSIS — R5383 Other fatigue: Secondary | ICD-10-CM | POA: Diagnosis not present

## 2014-10-12 DIAGNOSIS — R112 Nausea with vomiting, unspecified: Secondary | ICD-10-CM | POA: Diagnosis not present

## 2014-10-12 DIAGNOSIS — Z800 Family history of malignant neoplasm of digestive organs: Secondary | ICD-10-CM | POA: Diagnosis not present

## 2014-10-12 DIAGNOSIS — G479 Sleep disorder, unspecified: Secondary | ICD-10-CM | POA: Diagnosis not present

## 2014-10-12 DIAGNOSIS — R141 Gas pain: Secondary | ICD-10-CM | POA: Diagnosis not present

## 2014-10-12 DIAGNOSIS — R200 Anesthesia of skin: Secondary | ICD-10-CM | POA: Diagnosis not present

## 2014-10-12 DIAGNOSIS — R509 Fever, unspecified: Secondary | ICD-10-CM | POA: Diagnosis not present

## 2014-10-12 DIAGNOSIS — K580 Irritable bowel syndrome with diarrhea: Secondary | ICD-10-CM | POA: Diagnosis not present

## 2014-10-12 DIAGNOSIS — K5660 Unspecified intestinal obstruction: Secondary | ICD-10-CM | POA: Diagnosis not present

## 2014-10-12 DIAGNOSIS — R450 Nervousness: Secondary | ICD-10-CM | POA: Diagnosis not present

## 2014-10-12 DIAGNOSIS — R410 Disorientation, unspecified: Secondary | ICD-10-CM | POA: Diagnosis not present

## 2014-10-12 DIAGNOSIS — H539 Unspecified visual disturbance: Secondary | ICD-10-CM | POA: Diagnosis not present

## 2014-10-13 ENCOUNTER — Telehealth: Payer: Self-pay | Admitting: Internal Medicine

## 2014-10-13 NOTE — Telephone Encounter (Signed)
I spoke with Mrs. Saez, she said Dr.Bloomfield wants to do surgery on the pt and they are not wanting to do that. I informed her that I would get the notes from Dr.Bloomfield and I would let RMR know and we would call her back. She is aware that it will be next week before she hears back from me.

## 2014-10-13 NOTE — Telephone Encounter (Signed)
Pt's wife called and wanted to get a message to RMR for him to call her because there is a big issue with a doctor at [facility]. I told her that i was going to transfer her call to J[facility] and explain what is going on and we would make RMR aware. [phone]

## 2014-10-16 ENCOUNTER — Other Ambulatory Visit: Payer: Self-pay

## 2014-10-16 DIAGNOSIS — K50819 Crohn's disease of both small and large intestine with unspecified complications: Secondary | ICD-10-CM

## 2014-10-16 DIAGNOSIS — K56609 Unspecified intestinal obstruction, unspecified as to partial versus complete obstruction: Secondary | ICD-10-CM

## 2014-10-16 DIAGNOSIS — R634 Abnormal weight loss: Secondary | ICD-10-CM

## 2014-10-16 NOTE — Telephone Encounter (Signed)
Pt saw Dr.Patrick Green at [facility]. Below are his recommendations:  I would like to restart him on biologic therapy as his small bowel mucosa seemed to have active disease despite Imuran. Will send off appropriate labs before starting.  - I would like him to see colorectal surgery about revision of the ostomy due to the prolapse and also consider completion of the colectomy at the same time as he is at risk for colon cancer in the residual colon but we have no way to screen it given the stricture. We will coordinate with surgery about timing of any procedure if it is even possible with therapy. I suspect we will need to start him on biologics to achieve mucosa healing of the small bowel before even considering an operation.  - I will obtain an MR enterography to evaluate the extent of his small bowel involvement at this time.  - RTC in 2 months     Dr.Rourk- pt is worried and scared to have surgery again and is refusing to go back to [facility] if that is all they are going to do. He would like RMR opinion.  Please advise.  I have tried to call the pt again to clarify that he understood these instructions but got no answer. Left message for him to return call.

## 2014-10-16 NOTE — Telephone Encounter (Signed)
Ginger please schedule this. Thanks.

## 2014-10-16 NOTE — Telephone Encounter (Signed)
He can have his MRE at [facility]; further recommendations to follow.

## 2014-10-16 NOTE — Telephone Encounter (Signed)
I spoke with the pts wife again- she said that Boe was told that he shouldn't be cut on again and he is scared to have any surgeries done.   Also he doesn't mind doing the MR enterography but wants to know if it can be done at [facility] or [place] instead of them having to drive all the way back to [place]. She said they do not have the money to go to [place] all the time.

## 2014-10-16 NOTE — Telephone Encounter (Signed)
Pt is schedule for Nov 2@ 1200 and wife is aware

## 2014-10-20 ENCOUNTER — Other Ambulatory Visit (HOSPITAL_COMMUNITY): Payer: Medicare Other

## 2014-10-23 ENCOUNTER — Other Ambulatory Visit: Payer: Self-pay | Admitting: Internal Medicine

## 2014-10-23 ENCOUNTER — Ambulatory Visit (HOSPITAL_COMMUNITY)
Admission: RE | Admit: 2014-10-23 | Discharge: 2014-10-23 | Disposition: A | Discharge status: Home / Self Care | Payer: Medicare Other | Source: Ambulatory Visit | Attending: Internal Medicine | Admitting: Internal Medicine

## 2014-10-23 ENCOUNTER — Encounter (HOSPITAL_COMMUNITY): Payer: Self-pay

## 2014-10-23 DIAGNOSIS — K50819 Crohn's disease of both small and large intestine with unspecified complications: Secondary | ICD-10-CM

## 2014-10-23 DIAGNOSIS — R590 Localized enlarged lymph nodes: Secondary | ICD-10-CM | POA: Diagnosis not present

## 2014-10-23 DIAGNOSIS — R634 Abnormal weight loss: Secondary | ICD-10-CM | POA: Diagnosis not present

## 2014-10-23 DIAGNOSIS — Z933 Colostomy status: Secondary | ICD-10-CM | POA: Insufficient documentation

## 2014-10-23 DIAGNOSIS — K5090 Crohn's disease, unspecified, without complications: Secondary | ICD-10-CM | POA: Diagnosis not present

## 2014-10-23 DIAGNOSIS — K56609 Unspecified intestinal obstruction, unspecified as to partial versus complete obstruction: Secondary | ICD-10-CM

## 2014-10-23 DIAGNOSIS — R599 Enlarged lymph nodes, unspecified: Secondary | ICD-10-CM | POA: Diagnosis not present

## 2014-10-23 DIAGNOSIS — K439 Ventral hernia without obstruction or gangrene: Secondary | ICD-10-CM | POA: Insufficient documentation

## 2014-10-23 DIAGNOSIS — Z9049 Acquired absence of other specified parts of digestive tract: Secondary | ICD-10-CM | POA: Insufficient documentation

## 2014-10-23 MED ORDER — GADOBENATE DIMEGLUMINE 529 MG/ML IV SOLN
10.0000 mL | Freq: Once | INTRAVENOUS | Status: AC | PRN
  Administered 2014-10-23: 10 mL via INTRAVENOUS

## 2014-11-02 ENCOUNTER — Encounter: Payer: Self-pay | Admitting: *Deleted

## 2014-11-08 ENCOUNTER — Ambulatory Visit (INDEPENDENT_AMBULATORY_CARE_PROVIDER_SITE_OTHER): Payer: Medicare Other | Admitting: Internal Medicine

## 2014-11-08 ENCOUNTER — Encounter: Payer: Self-pay | Admitting: Internal Medicine

## 2014-11-08 VITALS — BP 103/67 | HR 74 | Temp 98.4°F | Ht 64.5 in | Wt 131.0 lb

## 2014-11-08 DIAGNOSIS — K50819 Crohn's disease of both small and large intestine with unspecified complications: Secondary | ICD-10-CM

## 2014-11-08 NOTE — Progress Notes (Signed)
Primary Care Physician:  Vic Blackbird, MD Primary Gastroenterologist:  Dr. Gala Romney  Pre-Procedure History & Physical: HPI:  Daniel Howard is a 58 y.o. male here for followup of complicated Crohn's disease. Saw Dr. Nyoka Cowden at [facility] recently. He requests an MRA that was done locally. Unfortunately, that did not add much to what we already may as far as disease activity, etc is concerned.[name] He continues on Imuran. Dr. Nyoka Cowden recommended a colectomy and resumption of biologic therapy. He is scheduled to see him back on December 12. Mr. Vacha has done okay since his last visit he had a cystoscopy back about 4 times a day weight;  today 131 pounds 132 pounds back on August 14.  Past Medical History  Diagnosis Date  . Anxiety   . Low back pain   . 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 Abnormal finding of biliary tract     MRCP shows pancreatic/biliary tract dilation. EUS 2010 confirmed dilation but no chronic pancreaitis or mass. Vascular ectasia crimpoing distal CBD.   [name] Spigelian hernia     bilateral  . Duodenal ulcer 2010    nsaids  . Peristomal hernia   . Small bowel obstruction     Past Surgical History  Procedure Laterality Date  . Colostomy  1992    transverse loop colostomy secondary to a stricture  . Hemicoloectomy w/ anastomosis  1993    R- Dr.DeMason   . Hernia repair  1996    incarcerated periostial hernia  . Esophagogastroduodenoscopy  05/2009    SLF: multiple antral erosions, large ulcer at ansatomosis (postsurgical changes at duodenal bulb and second portion of duodenum) BX c/x NSAIDS.  [name] Flexible sigmoidoscopy  1988    Dr. Laural Golden- suggested rohn's disease but the biopsies were not collaborative.  [name] Appendectomy  1992  . Eus  10/04/2009    Dr. Estill Bakes dilated CBD and main pancreatic duct.  No  pancreatic  . Colonoscopy [name] 08/23/2014    WEX:HBZJIRC proctoscopy with possible fistulous opening in thebase of rectal/anal stump.      Prior to Admission medications   Medication Sig Start Date End Date Taking? Authorizing Provider  azaTHIOprine (IMURAN) 50 MG tablet Take 25-50 mg by mouth 2 (two) times daily. Takes 13m in the morning and 243min the evening   Yes Historical Provider, MD  calcium carbonate (OS-CAL) 600 MG TABS tablet Take 1,200 mg by mouth daily with breakfast.   Yes Historical Provider, MD  Ferrous Sulfate 28 MG TABS Take 1 tablet by mouth daily.   Yes Historical Provider, MD  HYDROcodone-acetaminophen (NORCO/VICODIN) 5-325 MG per tablet Take 1 tablet by mouth every 6 (six) hours as needed for moderate pain.   Yes Historical Provider, MD  mirtazapine (REMERON) 15 MG tablet Take 15 mg by mouth at bedtime.   Yes Historical Provider, MD  Multiple Vitamins-Minerals (CENTRUM SILVER ADULT 50+) TABS Take 1 tablet by mouth daily.   Yes Historical Provider, MD    Allergies as of 11/08/2014 - Review Complete 11/08/2014  Allergen Reaction Noted  . Penicillins Hives 01/25/2007    Family History  Problem Relation Age of Onset  . Cancer Father     prostate   . Prostate cancer Father   . Hypertension Sister   . Cancer Sister   . Depression Sister   . Breast cancer Sister   .  COPD Sister   . Aneurysm Brother     deceased, brain aneurysm    History   Social History  . Marital Status: Married    Spouse Name: [name]    Number of Children: 10  . Years of Education: [name]   Occupational History  . disabled    Social History Main Topics  . Smoking status: Former Smoker -- 0.50 packs/day for 2 years    Types: Cigarettes    Quit date: 12/21/2009  . Smokeless tobacco: Never Used  . Alcohol Use: No     Comment: Former drinker  . Drug Use: No  . Sexual Activity:    Partners: Female   Other Topics Concern  . Not on file   Social History Narrative    Review of  Systems: See HPI, otherwise negative ROS  Physical Exam: BP 103/67 mmHg  Pulse 74  Temp(Src) 98.4 F (36.9 C) (Oral)  Ht 5' 4.5" (1.638 m)  Wt 131 lb (59.421 kg)  BMI 22.15 kg/m2 General:   Alert,  Somewhat chronically ill gentleman accompanied by his spouse. Appears comfortable.Skin:  Intact without significant lesions or rashes. Eyes:  Sclera clear, no icterus.   Conjunctiva pink. Ears:  Normal auditory acuity. Nose:  No deformity, discharge,  or lesions. Mouth:  No deformity or lesions. Neck:  Supple; no masses or thyromegaly. No significant cervical adenopathy. Lungs:  Clear throughout to auscultation.   No wheezes, crackles, or rhonchi. No acute distress. Heart:  Regular rate and rhythm; no murmurs, clicks, rubs,  or gallops. Abdomen: nondistended. Ostomy right lower quadrant. Multiple surgical scars multiple hernias. Abdomen is soft and nontender. Pulses:  Normal pulses noted. Extremities:  Without clubbing or edema.  Impression: Long history of complicated Crohn's disease.  Patient has a good bit of sequestered left  Colon which cannot be surveilled adequately. Saw Dr. Nyoka Cowden at [facility] recently. He is in favor for reinstituting biologic therapy and getting the remainder of his colon, at risk, removed. I agree with this approach. Patient is hesitant about having any further surgery. He was on Humira several years ago but ended up coming off of it because of the third party payer issues.  Recommendations:    Keep appointment with Dr. Nyoka Cowden next month  Continue Azathioprine  TB skin test   Further recommendations to follow     Notice: This dictation was prepared with Dragon dictation along with smaller phrase technology. Any transcriptional errors that result from this process are unintentional and may not be corrected upon review.

## 2014-11-08 NOTE — Patient Instructions (Signed)
Keep appointment with Dr. Nyoka Cowden next month  Continue Azathioprine  TB skin test   Further recommendations to follow

## 2014-11-21 ENCOUNTER — Ambulatory Visit (INDEPENDENT_AMBULATORY_CARE_PROVIDER_SITE_OTHER): Payer: Medicare Other | Admitting: Family Medicine

## 2014-11-21 DIAGNOSIS — Z111 Encounter for screening for respiratory tuberculosis: Secondary | ICD-10-CM

## 2014-11-21 NOTE — Progress Notes (Signed)
Pt came for PPD TB skin test.  Needs done per GI prior to starting treatment for Crohn's Disease.  PPD applied to left forearm without difficulty.  Pt instructed not to bother area.  No rubbing or scratching.  Needs to return on Thursday for [place] to assess any reaction.  Pt acknowledges understanding.

## 2014-11-23 ENCOUNTER — Ambulatory Visit: Payer: Self-pay

## 2014-11-24 ENCOUNTER — Ambulatory Visit: Payer: Medicare Other | Admitting: Family Medicine

## 2014-11-24 DIAGNOSIS — H25013 Cortical age-related cataract, bilateral: Secondary | ICD-10-CM | POA: Diagnosis not present

## 2014-11-24 DIAGNOSIS — Z111 Encounter for screening for respiratory tuberculosis: Secondary | ICD-10-CM

## 2014-11-24 DIAGNOSIS — H35363 Drusen (degenerative) of macula, bilateral: Secondary | ICD-10-CM | POA: Diagnosis not present

## 2014-11-24 LAB — TB SKIN TEST
Induration: 0 mm
TB SKIN TEST: NEGATIVE

## 2014-11-24 MED ORDER — HYDROCODONE-ACETAMINOPHEN 5-325 MG PO TABS
1.0000 | ORAL_TABLET | Freq: Four times a day (QID) | ORAL | Status: DC | PRN
Start: 2014-11-24 — End: 2014-11-24

## 2014-11-24 MED ORDER — HYDROCODONE-ACETAMINOPHEN 5-325 MG PO TABS: 1.0000 | ORAL_TABLET | Freq: Four times a day (QID) | ORAL | Status: DC | PRN

## 2014-11-24 NOTE — Progress Notes (Signed)
Patient ID: Daniel Howard, male   DOB: 1956/02/22, 58 y.o.   MRN: [id number] Pt came to have PPD TB skin test read.  Site free of any reaction.  Pt given copy of result.  Also requested refill of his Hydrocodone, which Dr [name] approved.  RX was handed to patient.

## 2014-12-08 DIAGNOSIS — K50819 Crohn's disease of both small and large intestine with unspecified complications: Secondary | ICD-10-CM | POA: Diagnosis not present

## 2014-12-25 ENCOUNTER — Ambulatory Visit (INDEPENDENT_AMBULATORY_CARE_PROVIDER_SITE_OTHER): Payer: Medicare Other | Admitting: Family Medicine

## 2014-12-25 ENCOUNTER — Encounter: Payer: Self-pay | Admitting: Family Medicine

## 2014-12-25 VITALS — BP 122/60 | HR 68 | Temp 98.0°F | Resp 14 | Ht 65.0 in | Wt 134.0 lb

## 2014-12-25 DIAGNOSIS — R7989 Other specified abnormal findings of blood chemistry: Secondary | ICD-10-CM

## 2014-12-25 DIAGNOSIS — K439 Ventral hernia without obstruction or gangrene: Secondary | ICD-10-CM | POA: Diagnosis not present

## 2014-12-25 DIAGNOSIS — K50819 Crohn's disease of both small and large intestine with unspecified complications: Secondary | ICD-10-CM | POA: Diagnosis not present

## 2014-12-25 DIAGNOSIS — Z23 Encounter for immunization: Secondary | ICD-10-CM

## 2014-12-25 DIAGNOSIS — E43 Unspecified severe protein-calorie malnutrition: Secondary | ICD-10-CM

## 2014-12-25 DIAGNOSIS — E559 Vitamin D deficiency, unspecified: Secondary | ICD-10-CM | POA: Diagnosis not present

## 2014-12-25 LAB — BASIC METABOLIC PANEL
BUN: 14 mg/dL (ref 6–23)
CHLORIDE: 99 meq/L (ref 96–112)
CO2: 27 mEq/L (ref 19–32)
Calcium: 9.1 mg/dL (ref 8.4–10.5)
Creat: 1.16 mg/dL (ref 0.50–1.35)
Glucose, Bld: 94 mg/dL (ref 70–99)
POTASSIUM: 3.8 meq/L (ref 3.5–5.3)
Sodium: 139 mEq/L (ref 135–145)

## 2014-12-25 MED ORDER — HYDROCODONE-ACETAMINOPHEN 5-325 MG PO TABS: 1.0000 | ORAL_TABLET | Freq: Four times a day (QID) | ORAL | Status: DC | PRN

## 2014-12-25 NOTE — Addendum Note (Signed)
Addended by: Sheral Flow on: 12/25/2014 11:07 AM   Modules accepted: Orders

## 2014-12-25 NOTE — Addendum Note (Signed)
Addended by: Vic Blackbird F on: 12/25/2014 10:08 AM   Modules accepted: Level of Service

## 2014-12-25 NOTE — Assessment & Plan Note (Signed)
Followed by  GI, declines further surgery, goal to maintain nutritional status and weight Recheck calcium and Vitamin D levels Continue supplement with ensure and MVI Chronic abd pain, does not use on regular basis, given script today for next few montsh

## 2014-12-25 NOTE — Patient Instructions (Signed)
Continue current medications We will call with lab results  F/U 4 months

## 2014-12-25 NOTE — Progress Notes (Signed)
Patient ID: Daniel Howard, male   DOB: 1956/03/03, 59 y.o.   MRN: [id number]   Subjective:    Patient ID: Daniel Howard, male    DOB: 12-15-1956, 59 y.o.   MRN: [id number]  Patient presents for 4 month F/U  patient here to follow chronic medical problems. History of Crohn's disease with severe colicystitis status post colectomy with significant scar tissue. He recently had a bowel obstruction. He was seen by specialist out ever [facility] they recommended he have a repeat surgery be started on biologic however he has declined this intervention as he is concerned about the risk involved. He is doing fairly well with his Imuran. He is actually up 3 pounds over the past 2 months. He states that his appetite is fairly well. He did have some mild lower abdominal pain a few days ago but this is now resolved.  Last set of labs showed hypocalcemia, recheck levels Anemia stable    Review Of Systems:  GEN- denies fatigue, fever, weight loss,weakness, recent illness HEENT- denies eye drainage, change in vision, nasal discharge, CVS- denies chest pain, palpitations RESP- denies SOB, cough, wheeze ABD- denies N/V, change in stools, + chronic abd pain GU- denies dysuria, hematuria, dribbling, incontinence MSK- denies joint pain, muscle aches, injury Neuro- denies headache, dizziness, syncope, seizure activity       Objective:    BP 122/60 mmHg  Pulse 68  Temp(Src) 98 F (36.7 C) (Oral)  Resp 14  Ht 5' 5"  (1.651 m)  Wt 134 lb (60.782 kg)  BMI 22.30 kg/m2 GEN- NAD, alert and oriented x3 HEENT- PERRL, EOMI, non injected sclera, pink conjunctiva, MMM, oropharynx clear Neck- Supple,  CVS- RRR, no murmur RESP-CTAB ABD-NABS,soft,ventral hernia, colostomy intact EXT- No edema Pulses- Radial 2+        Assessment & Plan:      Problem List Items Addressed This Visit      Unprioritized   Ventral hernia   Protein-calorie malnutrition, severe   Crohn's disease of  both small and large intestine with complication    Other Visit Diagnoses    Low serum calcium    -  Primary    Relevant Orders       Basic metabolic panel    Vitamin D deficiency        Relevant Orders       Vitamin D, 25-hydroxy       Note: This dictation was prepared with Dragon dictation along with smaller phrase technology. Any transcriptional errors that result from this process are unintentional.

## 2014-12-25 NOTE — Assessment & Plan Note (Signed)
Discussed monitoring what he lifits to avoid worsening hernia Surgery only if emergency indicated

## 2014-12-26 ENCOUNTER — Encounter: Payer: Self-pay | Admitting: *Deleted

## 2014-12-26 ENCOUNTER — Other Ambulatory Visit: Payer: Self-pay | Admitting: *Deleted

## 2014-12-26 LAB — VITAMIN D 25 HYDROXY (VIT D DEFICIENCY, FRACTURES): Vit D, 25-Hydroxy: 40 ng/mL (ref 30–100)

## 2014-12-26 MED ORDER — MIRTAZAPINE 15 MG PO TABS: 15.0000 mg | ORAL_TABLET | Freq: Every day | ORAL | Status: DC

## 2014-12-26 NOTE — Telephone Encounter (Signed)
Received fax requesting refill on Remeron with 90 day supply.   Refill appropriate and filled per protocol.

## 2015-03-19 ENCOUNTER — Other Ambulatory Visit: Payer: Self-pay | Admitting: Gastroenterology

## 2015-03-22 ENCOUNTER — Other Ambulatory Visit: Payer: Self-pay | Admitting: Gastroenterology

## 2015-04-23 ENCOUNTER — Encounter: Payer: Self-pay | Admitting: Internal Medicine

## 2015-04-30 ENCOUNTER — Encounter: Payer: Self-pay | Admitting: Family Medicine

## 2015-04-30 ENCOUNTER — Ambulatory Visit (INDEPENDENT_AMBULATORY_CARE_PROVIDER_SITE_OTHER): Payer: Medicare Other | Admitting: Family Medicine

## 2015-04-30 VITALS — BP 118/62 | HR 72 | Temp 98.4°F | Resp 14 | Ht 65.0 in | Wt 143.0 lb

## 2015-04-30 DIAGNOSIS — E43 Unspecified severe protein-calorie malnutrition: Secondary | ICD-10-CM

## 2015-04-30 DIAGNOSIS — M6530 Trigger finger, unspecified finger: Secondary | ICD-10-CM

## 2015-04-30 DIAGNOSIS — S46911A Strain of unspecified muscle, fascia and tendon at shoulder and upper arm level, right arm, initial encounter: Secondary | ICD-10-CM

## 2015-04-30 DIAGNOSIS — D6489 Other specified anemias: Secondary | ICD-10-CM

## 2015-04-30 DIAGNOSIS — K50819 Crohn's disease of both small and large intestine with unspecified complications: Secondary | ICD-10-CM

## 2015-04-30 MED ORDER — HYDROCODONE-ACETAMINOPHEN 5-325 MG PO TABS: 1.0000 | ORAL_TABLET | Freq: Four times a day (QID) | ORAL | Status: DC | PRN

## 2015-04-30 NOTE — Progress Notes (Signed)
Patient ID: Daniel Howard, male   DOB: 05-24-1956, 59 y.o.   MRN: [id number]   Subjective:    Patient ID: Daniel Howard, male    DOB: 02/01/1956, 59 y.o.   MRN: [id number]  Patient presents for 3 month F/U  patient to follow-up chronic medical problems. He is concerned about trigger finger on his left hand which has been there for the past few months but it is getting worse. He also has right shoulder pain which is been there for about 2 months. He does not remember any injury to his shoulder but is unable to lift his arm completely up. He denies any tingling numbness in his hand denies any neck pain. Regarding his Crohn's disease he is doing fairly well. I put him on mirtazapine at the last visit and he has gained 9 pounds. He does request a refill of his pain medication that he uses only for flares.    Review Of Systems:  GEN- denies fatigue, fever, weight loss,weakness, recent illness HEENT- denies eye drainage, change in vision, nasal discharge, CVS- denies chest pain, palpitations RESP- denies SOB, cough, wheeze ABD- denies N/V, change in stools, abd pain GU- denies dysuria, hematuria, dribbling, incontinence MSK- + joint pain, muscle aches, injury Neuro- denies headache, dizziness, syncope, seizure activity       Objective:    BP 118/62 mmHg  Pulse 72  Temp(Src) 98.4 F (36.9 C) (Oral)  Resp 14  Ht 5' 5"  (1.651 m)  Wt 143 lb (64.864 kg)  BMI 23.80 kg/m2 GEN- NAD, alert and oriented x3 HEENT- PERRL, EOMI, non injected sclera, pink conjunctiva, MMM, oropharynx clear Neck- Supple, FROM CVS- RRR, no murmur RESP-CTAB MSK- Trigger finger of middle finger of left hand, swelling of MIP, mild TTP over joint, right shoulder normal inspection decreased ROM, +empty can on right side, left FROM,biceps in tact, strength 4/5 compared to left EXT- No edema Pulses- Radial, DP- 2+        Assessment & Plan:      Problem List Items Addressed This Visit    Protein-calorie  malnutrition, severe - Primary    He has responded well to remeron, weight up 9lbs , sleep also good, continue for now, will then try to taper him off this medication      Crohn's disease of both small and large intestine with complication    Avoid NSAIDS due to Crohns disease, currently stable      Anemia    Other Visit Diagnoses    Trigger finger, acquired        Referral to ortho    Shoulder strain, right, initial encounter        No NSAIDS due to Crohns disease and severe GI problems, anemia, he is doing some exercises, send to ortho, injection may be beneficial       Note: This dictation was prepared with Dragon dictation along with smaller phrase technology. Any transcriptional errors that result from this process are unintentional.

## 2015-04-30 NOTE — Patient Instructions (Addendum)
F/U 6 months for PHYSICAL- Fasting labs Continue current medications REferral to Dr. Aline Brochure for shoulder and trigger finger

## 2015-05-01 NOTE — Assessment & Plan Note (Signed)
Avoid NSAIDS due to Crohns disease, currently stable

## 2015-05-01 NOTE — Assessment & Plan Note (Signed)
He has responded well to remeron, weight up 9lbs , sleep also good, continue for now, will then try to taper him off this medication

## 2015-05-14 ENCOUNTER — Encounter: Payer: Self-pay | Admitting: Gastroenterology

## 2015-05-14 ENCOUNTER — Ambulatory Visit (INDEPENDENT_AMBULATORY_CARE_PROVIDER_SITE_OTHER): Payer: Medicare Other | Admitting: Gastroenterology

## 2015-05-14 VITALS — BP 112/74 | HR 61 | Temp 97.6°F | Ht 64.0 in | Wt 149.0 lb

## 2015-05-14 DIAGNOSIS — K50819 Crohn's disease of both small and large intestine with unspecified complications: Secondary | ICD-10-CM | POA: Diagnosis not present

## 2015-05-14 NOTE — Progress Notes (Signed)
Primary Care Physician: Vic Blackbird, MD  Primary Gastroenterologist:  Garfield Cornea, MD   Chief Complaint  Patient presents with  . Follow-up    HPI: Daniel Howard is a 59 y.o. male here for follow-up of complicated Crohn's disease. Multiple prior abdominal surgeries including right hemicolectomy with ileostomy. Last seen in our office in November 2015. Has been seen by Dr. Nyoka Cowden at [facility] within the past year. Dr. Nyoka Cowden recommended a colectomy and resumption of biologic therapy initially. The patient refused surgery. Issue has been that his left colon that  remains cannot be surveilled adequately due to rectal stricture. At last f/u OV with Dr. Nyoka Cowden in 11/2014, he held off on starting Humira since he seemed to be doing well clinically.  Patient had a proctoscopy in September 2015 showing possible fistulous opening in the base of the rectal/anal stump. There was no attendant go across this area anyway. Scope was then placed through the ostomy and immediately entered somewhat dilated, inflamed appearing small bowel with cobblestoning and ulcers. There was a couple of nearly 1 cm inflammatory appearing polyps.Noted to have chronic active ileitis consistent with Crohn's disease on small bowel biopsy, small bowel polyp was inflammatory.  CT in October 2015 showed what appear to be a significant amount of sigmoid colon left and may have a portion of colon in continuity with his ostomy. Patient does not have stool that comes through his rectum.  MRE November 2015, severely limited by large amount of patient respiratory motion and insufficient to accurately assess for areas of activity in the setting of Crohn's disease. Noted to have a large left-sided Spigelian hernia containing several loops of small bowel, mildly dilated loop of bowel measuring 4.3 cm in the inferior aspect of the patient's left Spigelian hernia and likely different fold pattern and the other loops of bowel in the  hernia.  Lialda in 2010. He has been on azathioprine since the spring of 2011. Patient denies being on any injectable medications in the past. Patient denies any abdominal pain. States he empties his ostomy about 3-4 times daily which is standard for him. Stools are always brown with no blood. Denies any significant abdominal pain. Appetite is good. Denies any heartburn, dysphagia, weight loss. Noted stoma prolapse stable.   Patient tells me today that he is adamant about avoiding surgery because he almost died with his last one.  Current Outpatient Prescriptions  Medication Sig Dispense Refill  . azaTHIOprine (IMURAN) 50 MG tablet Take 25-50 mg by mouth 2 (two) times daily. Takes 16m in the morning and 229min the evening    . azaTHIOprine (IMURAN) 50 MG tablet TAKE 1 TABLET IN MORNING AND 1/2 TABLET IN THE EVENING 45 tablet 5  . calcium carbonate (OS-CAL) 600 MG TABS tablet Take 1,200 mg by mouth daily with breakfast.    . Ferrous Sulfate 28 MG TABS Take 1 tablet by mouth daily.    . Marland KitchenYDROcodone-acetaminophen (NORCO/VICODIN) 5-325 MG per tablet Take 1 tablet by mouth every 6 (six) hours as needed for moderate pain. 60 tablet 0  . mirtazapine (REMERON) 15 MG tablet Take 1 tablet (15 mg total) by mouth at bedtime. 90 tablet 2  . Multiple Vitamins-Minerals (CENTRUM SILVER ADULT 50+) TABS Take 1 tablet by mouth daily.    . simethicone (MYLICON) 80 MG chewable tablet Chew 80 mg by mouth every 6 (six) hours as needed for flatulence.     No current facility-administered medications for this visit.  Allergies as of 05/14/2015 - Review Complete 05/14/2015  Allergen Reaction Noted  . Penicillins Hives 01/25/2007   Past Medical History  Diagnosis Date  . Anxiety   . Low back pain   . 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 Abnormal finding of biliary tract     MRCP shows pancreatic/biliary tract dilation. EUS 2010 confirmed dilation but no chronic pancreaitis or mass. Vascular ectasia crimpoing distal CBD.   [name] Spigelian hernia     bilateral  . Duodenal ulcer 2010    nsaids  . Peristomal hernia   . Small bowel obstruction    Past Surgical History  Procedure Laterality Date  . Colostomy  1992    transverse loop colostomy secondary to a stricture  . Hemicoloectomy w/ anastomosis  1993    R- Dr.DeMason   . Hernia repair  1996    incarcerated periostial hernia with additional surgery in 1999  . Esophagogastroduodenoscopy  05/2009    SLF: multiple antral erosions, large ulcer at ansatomosis (postsurgical changes at duodenal bulb and second portion of duodenum) BX c/x NSAIDS.  [name] Flexible sigmoidoscopy  1988    Dr. Laural Golden- suggested rohn's disease but the biopsies were not collaborative.  [name] Appendectomy  1992    at time of exp laparotomy at which time he was noted to have fistulizing Crohn's rather than UC  . Eus  10/04/2009    Dr. Estill Bakes dilated CBD and main pancreatic duct.  No pancreatic  . Colonoscopy [name] 08/23/2014    NTI:[id number] proctoscopy with possible fistulous opening in thebase of rectal/anal stump.      ROS:  General: Negative for anorexia, weight loss, fever, chills, fatigue, weakness. ENT: Negative for hoarseness, difficulty swallowing , nasal congestion. CV: Negative for chest pain, angina, palpitations, dyspnea on exertion, peripheral edema.  Respiratory: Negative for dyspnea at rest, dyspnea on exertion, cough, sputum, wheezing.  GI: See history of present illness. GU:  Negative for dysuria, hematuria, urinary incontinence, urinary frequency, nocturnal urination.  Endo: Negative for unusual weight change.      Physical Examination:   BP 112/74 mmHg  Pulse 61  Temp(Src) 97.6 F (36.4 C)  Ht 5' 4"  (1.626 m)  Wt 149 lb (67.586 kg)  BMI 25.56 kg/m2  General: Well-nourished, well-developed in no acute distress.  Eyes: No icterus. Mouth: Oropharyngeal mucosa moist and pink , no lesions erythema or exudate. Lungs: Clear to auscultation bilaterally.  Heart: Regular rate and rhythm, no murmurs rubs or gallops.  Abdomen: Bowel sounds are normal, nontender, nondistended, no hepatosplenomegaly or masses, no abdominal bruits. Ostomy in the right lower quadrant. Brown liquid stool present. Prolapsed stoma noted. Appears good in color, healthy. Large left-sided abdominal hernia, abnormal contour to abdomen from multiple surgeries. Abdomen is nontender.    Extremities: No lower extremity edema. No clubbing or deformities. Neuro: Alert and oriented x 4   Skin: Warm and dry, no jaundice.   Psych: Alert and cooperative, normal mood and affect.  Labs:  Reviewed labs and care everywhere, [place] scanned labs under our Epic.  Lab Results  Component Value Date   CREATININE 1.16 12/25/2014   BUN 14 12/25/2014   NA 139 12/25/2014  K 3.8 12/25/2014   CL 99 12/25/2014   CO2 27 12/25/2014   Lab Results  Component Value Date   CREATININE 1.16 12/25/2014   BUN 14 12/25/2014   NA 139 12/25/2014   K 3.8 12/25/2014   CL 99 12/25/2014   CO2 27 12/25/2014   Lab Results  Component Value Date   ALT <5 10/10/2014   AST 15 10/10/2014   ALKPHOS 51 10/10/2014   BILITOT 0.7 10/10/2014   Lab Results  Component Value Date   LIPASE 13 10/10/2014   Lab Results  Component Value Date   WBC 4.7 10/10/2014   HGB 13.8 10/10/2014   HCT 39.7 10/10/2014   MCV 94.5 10/10/2014   PLT 393 10/10/2014   October 2015, B12 799, CRP 106.6 high, iron 37 low, ferritin 327 high, TIBC 211 low, iron saturations 18% low, sedimentation rate 70 high, hepatitis B surface antigen nonreactive, hepatitis B core antibody  nonreactive, hepatitis C antibody nonreactive, hepatitis A antibody nonreactive.  Imaging Studies: No results found.

## 2015-05-14 NOTE — Patient Instructions (Signed)
1. I will discuss your case with Dr. Gala Romney and further recommendations to follow regarding if we need to start you on shots for your Crohn's and how we will continue screening for colon cancer screening.

## 2015-05-16 ENCOUNTER — Encounter: Payer: Self-pay | Admitting: Gastroenterology

## 2015-05-16 NOTE — Assessment & Plan Note (Signed)
Complicated course as outlined above. Clinically appears to be doing well. In September 2015 he had obvious active disease at time of ileoscopy. On Imuran at that time. MRE limited due to artifact from patient's unable to hold breath adequately. Patient also has left colon which is unassessable via endoscopic surveillance due to rectal stricturing. He does not want to have surgery. CT imaging is limited due to lack of ability to have oral contrast present. Will discuss further surveillance neighbors with Dr. Gala Romney. Suspect he would benefit from being on Humira given evidence of active disease at time of last ileoscopy. Discuss further with Dr. Gala Romney. TB skin testing up to date. Hep B and C serologies negative.

## 2015-05-16 NOTE — Progress Notes (Signed)
cc'ed to pcp °

## 2015-05-28 DIAGNOSIS — S46011A Strain of muscle(s) and tendon(s) of the rotator cuff of right shoulder, initial encounter: Secondary | ICD-10-CM | POA: Diagnosis not present

## 2015-05-28 DIAGNOSIS — M7541 Impingement syndrome of right shoulder: Secondary | ICD-10-CM | POA: Diagnosis not present

## 2015-05-28 DIAGNOSIS — M65332 Trigger finger, left middle finger: Secondary | ICD-10-CM | POA: Diagnosis not present

## 2015-05-28 DIAGNOSIS — M19011 Primary osteoarthritis, right shoulder: Secondary | ICD-10-CM | POA: Diagnosis not present

## 2015-06-06 DIAGNOSIS — M25511 Pain in right shoulder: Secondary | ICD-10-CM | POA: Diagnosis not present

## 2015-06-06 DIAGNOSIS — M25611 Stiffness of right shoulder, not elsewhere classified: Secondary | ICD-10-CM | POA: Diagnosis not present

## 2015-06-06 DIAGNOSIS — S46011D Strain of muscle(s) and tendon(s) of the rotator cuff of right shoulder, subsequent encounter: Secondary | ICD-10-CM | POA: Diagnosis not present

## 2015-06-06 DIAGNOSIS — M19011 Primary osteoarthritis, right shoulder: Secondary | ICD-10-CM | POA: Diagnosis not present

## 2015-06-13 DIAGNOSIS — M25611 Stiffness of right shoulder, not elsewhere classified: Secondary | ICD-10-CM | POA: Diagnosis not present

## 2015-06-13 DIAGNOSIS — M25511 Pain in right shoulder: Secondary | ICD-10-CM | POA: Diagnosis not present

## 2015-06-13 DIAGNOSIS — M19011 Primary osteoarthritis, right shoulder: Secondary | ICD-10-CM | POA: Diagnosis not present

## 2015-06-13 DIAGNOSIS — S46011D Strain of muscle(s) and tendon(s) of the rotator cuff of right shoulder, subsequent encounter: Secondary | ICD-10-CM | POA: Diagnosis not present

## 2015-06-20 DIAGNOSIS — S46011D Strain of muscle(s) and tendon(s) of the rotator cuff of right shoulder, subsequent encounter: Secondary | ICD-10-CM | POA: Diagnosis not present

## 2015-06-20 DIAGNOSIS — M25511 Pain in right shoulder: Secondary | ICD-10-CM | POA: Diagnosis not present

## 2015-06-20 DIAGNOSIS — M25611 Stiffness of right shoulder, not elsewhere classified: Secondary | ICD-10-CM | POA: Diagnosis not present

## 2015-06-20 DIAGNOSIS — M19011 Primary osteoarthritis, right shoulder: Secondary | ICD-10-CM | POA: Diagnosis not present

## 2015-06-27 DIAGNOSIS — M25511 Pain in right shoulder: Secondary | ICD-10-CM | POA: Diagnosis not present

## 2015-06-27 DIAGNOSIS — M25611 Stiffness of right shoulder, not elsewhere classified: Secondary | ICD-10-CM | POA: Diagnosis not present

## 2015-06-27 DIAGNOSIS — M19011 Primary osteoarthritis, right shoulder: Secondary | ICD-10-CM | POA: Diagnosis not present

## 2015-06-27 DIAGNOSIS — S46011D Strain of muscle(s) and tendon(s) of the rotator cuff of right shoulder, subsequent encounter: Secondary | ICD-10-CM | POA: Diagnosis not present

## 2015-07-04 DIAGNOSIS — M19011 Primary osteoarthritis, right shoulder: Secondary | ICD-10-CM | POA: Diagnosis not present

## 2015-07-04 DIAGNOSIS — M25611 Stiffness of right shoulder, not elsewhere classified: Secondary | ICD-10-CM | POA: Diagnosis not present

## 2015-07-04 DIAGNOSIS — M25511 Pain in right shoulder: Secondary | ICD-10-CM | POA: Diagnosis not present

## 2015-07-04 DIAGNOSIS — S46011D Strain of muscle(s) and tendon(s) of the rotator cuff of right shoulder, subsequent encounter: Secondary | ICD-10-CM | POA: Diagnosis not present

## 2015-09-22 ENCOUNTER — Other Ambulatory Visit: Payer: Self-pay | Admitting: Family Medicine

## 2015-09-24 NOTE — Telephone Encounter (Signed)
Refill appropriate and filled per protocol. 

## 2015-10-06 ENCOUNTER — Other Ambulatory Visit: Payer: Self-pay | Admitting: Nurse Practitioner

## 2015-10-08 ENCOUNTER — Encounter: Payer: Self-pay | Admitting: Internal Medicine

## 2015-10-10 ENCOUNTER — Other Ambulatory Visit: Payer: Self-pay | Admitting: Nurse Practitioner

## 2015-10-14 ENCOUNTER — Other Ambulatory Visit: Payer: Self-pay | Admitting: Nurse Practitioner

## 2015-10-29 ENCOUNTER — Other Ambulatory Visit: Payer: Self-pay | Admitting: Family Medicine

## 2015-10-29 ENCOUNTER — Other Ambulatory Visit: Payer: Medicare Other

## 2015-10-29 DIAGNOSIS — Z0000 Encounter for general adult medical examination without abnormal findings: Secondary | ICD-10-CM

## 2015-10-29 DIAGNOSIS — Z125 Encounter for screening for malignant neoplasm of prostate: Secondary | ICD-10-CM | POA: Diagnosis not present

## 2015-10-29 DIAGNOSIS — Z79899 Other long term (current) drug therapy: Secondary | ICD-10-CM

## 2015-10-29 DIAGNOSIS — D649 Anemia, unspecified: Secondary | ICD-10-CM

## 2015-10-29 DIAGNOSIS — E43 Unspecified severe protein-calorie malnutrition: Secondary | ICD-10-CM

## 2015-10-29 DIAGNOSIS — I959 Hypotension, unspecified: Secondary | ICD-10-CM | POA: Diagnosis not present

## 2015-10-29 DIAGNOSIS — K50819 Crohn's disease of both small and large intestine with unspecified complications: Secondary | ICD-10-CM

## 2015-10-29 LAB — COMPLETE METABOLIC PANEL WITH GFR
ALBUMIN: 3.4 g/dL — AB (ref 3.6–5.1)
ALT: 6 U/L — AB (ref 9–46)
AST: 18 U/L (ref 10–35)
Alkaline Phosphatase: 60 U/L (ref 40–115)
BUN: 15 mg/dL (ref 7–25)
CALCIUM: 8.5 mg/dL — AB (ref 8.6–10.3)
CO2: 28 mmol/L (ref 20–31)
CREATININE: 1.23 mg/dL (ref 0.70–1.33)
Chloride: 102 mmol/L (ref 98–110)
GFR, Est African American: 75 mL/min (ref >=60)
GFR, Est Non African American: 65 mL/min (ref >=60)
GLUCOSE: 79 mg/dL (ref 70–99)
POTASSIUM: 4.2 mmol/L (ref 3.5–5.3)
SODIUM: 139 mmol/L (ref 135–146)
Total Bilirubin: 0.7 mg/dL (ref 0.2–1.2)
Total Protein: 6.9 g/dL (ref 6.1–8.1)

## 2015-10-29 LAB — CBC WITH DIFFERENTIAL/PLATELET
BASOS ABS: 0.1 10*3/uL (ref 0.0–0.1)
BASOS PCT: 1 % (ref 0–1)
Eosinophils Absolute: 0.3 10*3/uL (ref 0.0–0.7)
Eosinophils Relative: 5 % (ref 0–5)
HCT: 37.6 % — ABNORMAL LOW (ref 39.0–52.0)
Hemoglobin: 12.5 g/dL — ABNORMAL LOW (ref 13.0–17.0)
LYMPHS PCT: 20 % (ref 12–46)
Lymphs Abs: 1.1 10*3/uL (ref 0.7–4.0)
MCH: 32.1 pg (ref 26.0–34.0)
MCHC: 33.2 g/dL (ref 30.0–36.0)
MCV: 96.4 fL (ref 78.0–100.0)
MONO ABS: 0.5 10*3/uL (ref 0.1–1.0)
MPV: 9 fL (ref 8.6–12.4)
Monocytes Relative: 8 % (ref 3–12)
Neutro Abs: 3.8 10*3/uL (ref 1.7–7.7)
Neutrophils Relative %: 66 % (ref 43–77)
Platelets: 308 10*3/uL (ref 150–400)
RBC: 3.9 MIL/uL — ABNORMAL LOW (ref 4.22–5.81)
RDW: 13.8 % (ref 11.5–15.5)
WBC: 5.7 10*3/uL (ref 4.0–10.5)

## 2015-10-29 LAB — LIPID PANEL
Cholesterol: 105 mg/dL — ABNORMAL LOW (ref 125–200)
HDL: 40 mg/dL (ref >=40)
LDL CALC: 46 mg/dL (ref <130)
Total CHOL/HDL Ratio: 2.6 Ratio (ref <=5.0)
Triglycerides: 95 mg/dL (ref <150)
VLDL: 19 mg/dL (ref <30)

## 2015-10-29 LAB — TSH: TSH: 2.044 u[IU]/mL (ref 0.350–4.500)

## 2015-10-31 ENCOUNTER — Encounter: Payer: Self-pay | Admitting: Family Medicine

## 2015-10-31 ENCOUNTER — Telehealth: Payer: Self-pay | Admitting: Gastroenterology

## 2015-10-31 ENCOUNTER — Ambulatory Visit (INDEPENDENT_AMBULATORY_CARE_PROVIDER_SITE_OTHER): Payer: Medicare Other | Admitting: Family Medicine

## 2015-10-31 VITALS — BP 118/72 | HR 68 | Temp 98.2°F | Resp 14 | Ht 64.0 in | Wt 154.0 lb

## 2015-10-31 DIAGNOSIS — M6530 Trigger finger, unspecified finger: Secondary | ICD-10-CM | POA: Diagnosis not present

## 2015-10-31 DIAGNOSIS — Z0000 Encounter for general adult medical examination without abnormal findings: Secondary | ICD-10-CM | POA: Diagnosis not present

## 2015-10-31 DIAGNOSIS — Z125 Encounter for screening for malignant neoplasm of prostate: Secondary | ICD-10-CM

## 2015-10-31 DIAGNOSIS — Z23 Encounter for immunization: Secondary | ICD-10-CM

## 2015-10-31 DIAGNOSIS — E43 Unspecified severe protein-calorie malnutrition: Secondary | ICD-10-CM | POA: Diagnosis not present

## 2015-10-31 MED ORDER — HYDROCODONE-ACETAMINOPHEN 5-325 MG PO TABS: 1.0000 | ORAL_TABLET | Freq: Four times a day (QID) | ORAL | Status: DC | PRN

## 2015-10-31 NOTE — Assessment & Plan Note (Signed)
Weight is much improved, albumin levels improved. Will cut back to Remeron 7.62m once a day, to see if we can wean him off and he still sustain is nutritional status

## 2015-10-31 NOTE — Patient Instructions (Signed)
Take 1/2 tablet of the Remeron- appetite pill We will call with PSA results Referral to ortho for the trigger finger F/U 6 months

## 2015-10-31 NOTE — Telephone Encounter (Signed)
Patient with upcoming appt which is important to keep. Need to consider Humira.   Discussed surveillance of left colon with RMR. Given patient has declined surgery (previously recommended to have colectomy), per RMR surveillance for lesion with VC, CTE unnecessary.

## 2015-10-31 NOTE — Assessment & Plan Note (Signed)
Refer back to Dr. Fredna Dow

## 2015-10-31 NOTE — Progress Notes (Signed)
Patient ID: Daniel Howard, male   DOB: Jul 31, 1956, 59 y.o.   MRN: [id number] Subjective:   Patient presents for Medicare Annual/Subsequent preventive examination.  . His only concern is he still has problems with trigger finger both his right and left hand. He would like to be referred back to the orthopedist. They put a shot in last time that works well.  His colonoscopy is up-to-date.  He is doing well to Remeron his weight is up 10 pounds he states that he feels well. He has not had a problems with his colostomy bag.  His fasting last reviewed at bedside Review Past Medical/Family/Social: Per EMR   Risk Factors  Current exercise habits: walks Dietary issues discussed: None   Cardiac risk factors: None   Depression Screen  (Note: if answer to either of the following is "Yes", a more complete depression screening is indicated)  Over the past two weeks, have you felt down, depressed or hopeless? No Over the past two weeks, have you felt little interest or pleasure in doing things? No Have you lost interest or pleasure in daily life? No Do you often feel hopeless? No Do you cry easily over simple problems? No   Activities of Daily Living  In your present state of health, do you have any difficulty performing the following activities?:  Driving? No  Managing money? No  Feeding yourself? No  Getting from bed to chair? No  Climbing a flight of stairs? No  Preparing food and eating?: No  Bathing or showering? No  Getting dressed: No  Getting to the toilet? No  Using the toilet:No  Moving around from place to place: No  In the past year have you fallen or had a near fall?:No  Are you sexually active? yes  Do you have more than one partner? No   Hearing Difficulties: No  Do you often ask people to speak up or repeat themselves? No  Do you experience ringing or noises in your ears? No Do you have difficulty understanding soft or whispered voices? No  Do you feel that you have  a problem with memory? No Do you often misplace items? No  Do you feel safe at home? Yes  Cognitive Testing  Alert? Yes Normal Appearance?Yes  Oriented to person? Yes Place? Yes  Time? Yes  Recall of three objects? Yes  Can perform simple calculations? Yes  Displays appropriate judgment?Yes  Can read the correct time from a watch face?Yes   List the Names of Other Physician/Practitioners you currently use:  Dr. Gala Romney, Dr. Fredna Dow   Screening Tests / Date Colonoscopy   -UTD                  Zostavax -age 77 Influenza Vaccine -Due today Tetanus/tdap- unable financial  GEN- denies fatigue, fever, weight loss,weakness, recent illness HEENT- denies eye drainage, change in vision, nasal discharge, CVS- denies chest pain, palpitations RESP- denies SOB, cough, wheeze ABD- denies N/V, change in stools, abd pain GU- denies dysuria, hematuria, dribbling, incontinence MSK- + joint pain, muscle aches, injury Neuro- denies headache, dizziness, syncope, seizure activity  Physical GEN- NAD, alert and oriented x3 HEENT- PERRL, EOMI, non injected sclera, pink conjunctiva, MMM, oropharynx clear Neck- Supple, no thryomegaly CVS- RRR, no murmur RESP-CTAB ABD-NABS,soft, colostomy in tact  MSK- mild triggering Right hand index finger, left hand middle finger, mild swelling at MIP bilat  EXT- No edema Pulses- Radial, DP- 2+      Assessment:    Annual  wellness medicare exam   Plan:    During the course of the visit the patient was educated and counseled about appropriate screening and preventive services including:  Flu shot given Screen Neg for depression.   PSA also to be added to labs, dicussed testing and results  Diet review for nutrition referral? Yes ____ Not Indicated __x__  Patient Instructions (the written plan) was given to the patient.  Medicare Attestation  I have personally reviewed:  The patient's medical and social history  Their use of alcohol, tobacco or illicit  drugs  Their current medications and supplements  The patient's functional ability including ADLs,fall risks, home safety risks, cognitive, and hearing and visual impairment  Diet and physical activities  Evidence for depression or mood disorders  The patient's weight, height, BMI, and visual acuity have been recorded in the chart. I have made referrals, counseling, and provided education to the patient based on review of the above and I have provided the patient with a written personalized care plan for preventive services.

## 2015-11-01 LAB — PSA, MEDICARE: PSA: 3.6 ng/mL (ref <=4.00)

## 2015-11-01 NOTE — Telephone Encounter (Signed)
Appointment reminder letter mailed to the pt.

## 2015-11-02 ENCOUNTER — Encounter: Payer: Self-pay | Admitting: *Deleted

## 2015-11-13 ENCOUNTER — Encounter: Payer: Self-pay | Admitting: Gastroenterology

## 2015-11-13 ENCOUNTER — Ambulatory Visit (INDEPENDENT_AMBULATORY_CARE_PROVIDER_SITE_OTHER): Payer: Medicare Other | Admitting: Gastroenterology

## 2015-11-13 VITALS — BP 109/71 | HR 66 | Temp 97.9°F | Ht 64.0 in | Wt 150.8 lb

## 2015-11-13 DIAGNOSIS — K50819 Crohn's disease of both small and large intestine with unspecified complications: Secondary | ICD-10-CM

## 2015-11-13 NOTE — Progress Notes (Signed)
Primary Care Physician: Vic Blackbird, MD  Primary Gastroenterologist:  Garfield Cornea, MD   Chief Complaint  Patient presents with  . Follow-up    HPI: Daniel Howard is a 59 y.o. male here for follow-up of complicated Crohn's disease.   Background history Multiple prior abdominal surgeries including right hemicolectomy with ileostomy. Last seen in May 2016.Has been seen by Dr. Nyoka Cowden at [facility] within the past year. Dr. Nyoka Cowden recommended a colectomy and resumption of biologic therapy initially. The patient refused surgery. Issue has been that his left colon that remains cannot be surveilled adequately due to rectal stricture. At last f/u OV with Dr. Nyoka Cowden in 11/2014, he held off on starting Humira since he seemed to be doing well clinically.  Patient had proctoscopy September 2015 showing possible fistulous opening in the base of the rectal/anal stop. No attempt to cross this area and anyway. Scope was passed to the ostomy and immediately entered somewhat dilated, inflamed appearing small bowel with cobblestoning and ulcers. Couple of nearly 1 cm inflammatory appearing polyps. Noted to have chronic active ileitis consistent with Crohn's on small bowel biopsy, small bowel polyp was inflammatory.  CT October 2015 show what appear to be significant amount of sigmoid colon left and may have a portion of colon in continuity with his ostomy. Patient reports that he passes mucus from his rectum at times.  MRA November 2015 severely limited by large amount of patient respiratory motion and insufficient to accurately access for areas of activity in setting of Crohn's disease. Noted to have large left-sided spigelian hernia containing several loops of small bowel, mildly dilated loop of bowel measuring 4.3 cm and inferior aspect of the patient's left spigelian hernia and likely different fold pattern and other loops of bowel in the hernia.    Patient clinically is doing well. He is on  Imuran 50 mg in the morning and 25 mg at night. Empties his ostomy bag about 3 times daily. Denies blood in the stool. Denies significant abdominal pain. Appetite is good. Denies weight loss, vomiting, heartburn. He was placed on Remeron previously by PCP because of weight loss but he has gained weight since that time, up 20 pounds in the past one year. Issue of being able to survey of remaining left colon has been a problem. Patient has previously stated that he would not undergo any type of elective surgeries because he almost died with his last one. He tells me today that he would consider surgery only if it was absolutely necessary therefore we may need to consider possibility of MR or CT imaging of remaining colon as means of surveillance given history of stricture. FH of CRC in father as well.   October thousand and 15, HCV antibody nonreactive, hepatitis B surface antigen nonreactive, hepatitis B core antibody nonreactive, sedimentation rate 70, ferritin 327, CRP 106.6.    Current Outpatient Prescriptions  Medication Sig Dispense Refill  . azaTHIOprine (IMURAN) 50 MG tablet TAKE 1 TABLET BY MOUTH IN THE MORNING AND 1/2 TABLET IN THE EVENING 45 tablet 5  . calcium carbonate (OS-CAL) 600 MG TABS tablet Take 1,200 mg by mouth daily with breakfast.    . Ferrous Sulfate 28 MG TABS Take 1 tablet by mouth daily.    [name] HYDROcodone-acetaminophen (NORCO/VICODIN) 5-325 MG tablet Take 1 tablet by mouth every 6 (six) hours as needed for moderate pain. 60 tablet 0  . mirtazapine (REMERON) 15 MG tablet TAKE 1 TABLET (15 MG TOTAL) BY MOUTH  AT BEDTIME. (Patient taking differently: Take 1/2 tablet) 90 tablet 2  . Multiple Vitamins-Minerals (CENTRUM SILVER ADULT 50+) TABS Take 1 tablet by mouth daily.     No current facility-administered medications for this visit.    Allergies as of 11/13/2015 - Review Complete 11/13/2015  Allergen Reaction Noted  . Penicillins Hives 01/25/2007   Past Surgical History    Procedure Laterality Date  . Colostomy  1992    transverse loop colostomy secondary to a stricture  . Hemicoloectomy w/ anastomosis  1993    R- Dr.DeMason   . Hernia repair  1996    incarcerated periostial hernia with additional surgery in 1999  . Esophagogastroduodenoscopy  05/2009    SLF: multiple antral erosions, large ulcer at ansatomosis (postsurgical changes at duodenal bulb and second portion of duodenum) BX c/x NSAIDS.  [name] Flexible sigmoidoscopy  1988    Dr. Laural Golden- suggested rohn's disease but the biopsies were not collaborative.  [name] Appendectomy  1992    at time of exp laparotomy at which time he was noted to have fistulizing Crohn's rather than UC  . Eus  10/04/2009    Dr. Estill Bakes dilated CBD and main pancreatic duct.  No pancreatic  . Colonoscopy [name] 08/23/2014    VZS:MOLMBEM proctoscopy with possible fistulous opening in thebase of rectal/anal stump.       ROS:  General: Negative for anorexia, weight loss, fever, chills, fatigue, weakness. ENT: Negative for hoarseness, difficulty swallowing , nasal congestion. CV: Negative for chest pain, angina, palpitations, dyspnea on exertion, peripheral edema.  Respiratory: Negative for dyspnea at rest, dyspnea on exertion, cough, sputum, wheezing.  GI: See history of present illness. GU:  Negative for dysuria, hematuria, urinary incontinence, urinary frequency, nocturnal urination.  Endo: Negative for unusual weight change.    Physical Examination:   BP 109/71 mmHg  Pulse 66  Temp(Src) 97.9 F (36.6 C)  Ht 5' 4"  (1.626 m)  Wt 150 lb 12.8 oz (68.402 kg)  BMI 25.87 kg/m2  General: Well-nourished, well-developed in no acute distress.  Eyes: No icterus. Mouth: Oropharyngeal mucosa moist and pink , no lesions erythema or exudate. Lungs: Clear to auscultation bilaterally.  Heart: Regular rate and rhythm, no murmurs rubs or gallops.  Abdomen: Bowel sounds are normal, nontender, nondistended, no hepatosplenomegaly or masses,  no abdominal bruits or hernia , no rebound or guarding.  Ostomy in the right lower quadrant. Brown stool present. Prolapsed stoma noted. Appears good in color and healthy. Large left-sided abdominal hernia, abnormal contour to abdomen for multiple surgeries, abdomen is nontender Extremities: No lower extremity edema. No clubbing or deformities. Neuro: Alert and oriented x 4   Skin: Warm and dry, no jaundice.   Psych: Alert and cooperative, normal mood and affect.  Labs:  Lab Results  Component Value Date   TSH 2.044 10/29/2015   Lab Results  Component Value Date   CREATININE 1.23 10/29/2015   BUN 15 10/29/2015   NA 139 10/29/2015   K 4.2 10/29/2015   CL 102 10/29/2015   CO2 28 10/29/2015   Lab Results  Component Value Date   ALT 6* 10/29/2015   AST 18 10/29/2015   ALKPHOS 60 10/29/2015   BILITOT 0.7 10/29/2015   Lab Results  Component Value Date   WBC 5.7 10/29/2015   HGB 12.5* 10/29/2015   HCT 37.6* 10/29/2015   MCV 96.4 10/29/2015   PLT 308 10/29/2015    Imaging Studies: No results found.

## 2015-11-13 NOTE — Patient Instructions (Signed)
1. Please have your labs done, this will check for TB and inflammation in your body. 2. Please read over the Humira information and call with any questions.

## 2015-11-14 NOTE — Assessment & Plan Note (Signed)
Complicated Crohn's disease as outlined above. Clinically he appears to be doing well. September 2015 he had active disease noted at time of ileoscopy. MR E limited due to patient's inability to hold breath adequately. Patient's left colon on assessable via endoscopic surveillance due to rectal stricturing. CT imaging limited due to lack of ability to have oral contrast present in that segment. Surveillance for history of chronic ileocolonic Crohn's disease, family history of colon cancer (father) has been an issue for the remaining part of his left colon which is inaccessible. Given patient has previously declined having any surgery, it was felt that surveillance would not be necessary however now patient tells me that he would have surgery only if it was absolutely necessary but not electively. He is also interested in pursuing Humira because of known underlying active Crohn's disease even with clinical remission. TB goal testing to be done. Will update inflammatory markers as well. Further recommendations to follow.

## 2015-11-19 NOTE — Progress Notes (Signed)
cc'ed to pcp °

## 2015-12-07 DIAGNOSIS — K50819 Crohn's disease of both small and large intestine with unspecified complications: Secondary | ICD-10-CM | POA: Diagnosis not present

## 2015-12-08 LAB — C-REACTIVE PROTEIN: CRP: 1.8 mg/dL — AB (ref <0.60)

## 2015-12-09 LAB — QUANTIFERON TB GOLD ASSAY (BLOOD)
Interferon Gamma Release Assay: NEGATIVE
Mitogen value: 5.29 IU/mL
QUANTIFERON NIL VALUE: 0.16 [IU]/mL
Quantiferon Tb Ag Minus Nil Value: -0.05 IU/mL
TB Ag value: 0.11 IU/mL

## 2015-12-29 NOTE — Progress Notes (Signed)
Quick Note:  TB Gold negative. Please start Humira process for Crohn's. Need to utilize [employer]. Needs OV with RMR only 4-6 weeks after Humira started. ______

## 2016-01-02 ENCOUNTER — Other Ambulatory Visit: Payer: Self-pay | Admitting: Gastroenterology

## 2016-01-09 ENCOUNTER — Telehealth: Payer: Self-pay | Admitting: General Practice

## 2016-01-09 NOTE — Telephone Encounter (Signed)
Patient's wife called in stating she received a call from [employer] in regards to Mr. Daniel Howard. Per Almyra Free the patient was approved for his medication, however he has a $3.70 copayment.   She said he will take the medication and let us know if he has any problems.

## 2016-02-01 ENCOUNTER — Telehealth: Payer: Self-pay

## 2016-02-01 NOTE — Telephone Encounter (Signed)
Nothing written in stone about this. good question. I would anticipate overlapping by 2 months, thereafter, continue with Humira alone. I do not anticipate him being on both Imuran and Humira indefinitely

## 2016-02-01 NOTE — Telephone Encounter (Signed)
pts wife is aware.

## 2016-02-01 NOTE — Telephone Encounter (Signed)
pts wife called- pt started humira on January 25th. He is still taking the imuran. She wanted to know if he should continue taking imuran and if so, how long does he need to take it? Or will he continue taking imuran the entire time he is on humira?  I could not find any instructions in [facility] note regarding this.

## 2016-02-07 DIAGNOSIS — H2513 Age-related nuclear cataract, bilateral: Secondary | ICD-10-CM | POA: Diagnosis not present

## 2016-02-07 DIAGNOSIS — H25013 Cortical age-related cataract, bilateral: Secondary | ICD-10-CM | POA: Diagnosis not present

## 2016-02-15 ENCOUNTER — Ambulatory Visit (INDEPENDENT_AMBULATORY_CARE_PROVIDER_SITE_OTHER): Payer: Medicare Other | Admitting: Family Medicine

## 2016-02-15 ENCOUNTER — Encounter: Payer: Self-pay | Admitting: Family Medicine

## 2016-02-15 VITALS — BP 110/74 | HR 82 | Temp 98.2°F | Resp 14 | Wt 151.0 lb

## 2016-02-15 DIAGNOSIS — M6530 Trigger finger, unspecified finger: Secondary | ICD-10-CM | POA: Diagnosis not present

## 2016-02-15 NOTE — Progress Notes (Signed)
Subjective:    Patient ID: Daniel Howard, male    DOB: [birth year], 58 y.o.   MRN: [id number]  HPI  patient has a trigger finger in his right second MCP joint on the volar surface. One year ago he received a cortisone injection there the ball him some relief. Symptoms have returned. He is actively triggering today on examination with flexion of his  MCP joints. He will manually have to straighten his second digit at the PIP and DIP joint. Other than discomfort he is having no other problems associated with it but he is requesting a cortisone injection. Past Medical History  Diagnosis Date  . Anxiety   . Low back pain   . 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 Abnormal finding of biliary tract     MRCP shows pancreatic/biliary tract dilation. EUS 2010 confirmed dilation but no chronic pancreaitis or mass. Vascular ectasia crimpoing distal CBD.   [name] Spigelian hernia     bilateral  . Duodenal ulcer 2010    nsaids  . Peristomal hernia   . Small bowel obstruction [facility])    Past Surgical History  Procedure Laterality Date  . Colostomy  1992    transverse loop colostomy secondary to a stricture  . Hemicoloectomy w/ anastomosis  1993    R- Dr.DeMason   . Hernia repair  1996    incarcerated periostial hernia with additional surgery in 1999  . Esophagogastroduodenoscopy  05/2009    SLF: multiple antral erosions, large ulcer at ansatomosis (postsurgical changes at duodenal bulb and second portion of duodenum) BX c/x NSAIDS.  [name] Flexible sigmoidoscopy  1988    Dr. Laural Golden- suggested rohn's disease but the biopsies were not collaborative.  [name] Appendectomy   1992    at time of exp laparotomy at which time he was noted to have fistulizing Crohn's rather than UC  . Eus  10/04/2009    Dr. Estill Bakes dilated CBD and main pancreatic duct.  No pancreatic  . Colonoscopy [name] 08/23/2014    GEX:[id number] proctoscopy with possible fistulous opening in thebase of rectal/anal stump.     Current Outpatient Prescriptions on File Prior to Visit  Medication Sig Dispense Refill  . azaTHIOprine (IMURAN) 50 MG tablet TAKE 1 TABLET BY MOUTH IN THE MORNING AND 1/2 TABLET IN THE EVENING 45 tablet 5  . calcium carbonate (OS-CAL) 600 MG TABS tablet Take 1,200 mg by mouth daily with breakfast.    . Ferrous Sulfate 28 MG TABS Take 1 tablet by mouth daily.    . mirtazapine (REMERON) 15 MG tablet TAKE 1 TABLET (15 MG TOTAL) BY MOUTH AT BEDTIME. (Patient taking differently: Take 1/2 tablet) 90 tablet 2  . Multiple Vitamins-Minerals (CENTRUM SILVER ADULT 50+) TABS Take 1 tablet by mouth daily.    [name] HYDROcodone-acetaminophen (NORCO/VICODIN) 5-325 MG tablet Take 1 tablet by mouth every 6 (six) hours as needed for moderate pain. (Patient not taking: Reported on 02/15/2016) 60 tablet 0   No current facility-administered medications on file prior to visit.   Allergies  Allergen Reactions  . Penicillins Hives   Social History   Social History  . Marital Status: Married    Spouse Name: [name]  .  Number of Children: 43  . Years of Education: [name]   Occupational History  . disabled    Social History Main Topics  . Smoking status: Former Smoker -- 0.50 packs/day for 2 years    Types: Cigarettes    Quit date: 12/21/2009  . Smokeless tobacco: Never Used  . Alcohol Use: No     Comment: Former drinker  . Drug Use: No  . Sexual Activity:    Partners: Female   Other Topics Concern  . Not on file   Social History Narrative      Review of Systems  All other systems reviewed and are negative.      Objective:   Physical Exam  Cardiovascular: Normal rate and regular  rhythm.   Pulmonary/Chest: Effort normal and breath sounds normal.  Musculoskeletal:       Left hand: He exhibits tenderness and bony tenderness. Normal strength noted.  Vitals reviewed.         Assessment & Plan:  Trigger finger, acquired   Using sterile technique, I injected the patient's hand just proximal to the right second MCP joint fracture file palpable nodule in the flexor tendon sheath. Patient tolerated the procedure well without complication. I used a mixture of 40 mg of Kenalog and 1 mL of lidocaine and injected half of the mixture.

## 2016-02-18 ENCOUNTER — Ambulatory Visit: Payer: Medicare Other | Admitting: Family Medicine

## 2016-02-25 ENCOUNTER — Encounter: Payer: Self-pay | Admitting: Family Medicine

## 2016-02-25 ENCOUNTER — Ambulatory Visit (INDEPENDENT_AMBULATORY_CARE_PROVIDER_SITE_OTHER): Payer: Medicare Other | Admitting: Family Medicine

## 2016-02-25 VITALS — BP 118/72 | HR 78 | Temp 98.3°F | Resp 12 | Ht 64.0 in | Wt 155.0 lb

## 2016-02-25 DIAGNOSIS — K50819 Crohn's disease of both small and large intestine with unspecified complications: Secondary | ICD-10-CM

## 2016-02-25 DIAGNOSIS — M6530 Trigger finger, unspecified finger: Secondary | ICD-10-CM | POA: Diagnosis not present

## 2016-02-25 MED ORDER — HYDROCODONE-ACETAMINOPHEN 5-325 MG PO TABS: 1.0000 | ORAL_TABLET | Freq: Four times a day (QID) | ORAL | Status: DC | PRN

## 2016-02-25 NOTE — Assessment & Plan Note (Addendum)
His weight is significantly improved and by discontinuing the Remeron and see how he does on his own. He will follow-up with gastroenterology with regards to his Humira I refilled his pain medication for his chronic pain associated with his Crohn's

## 2016-02-25 NOTE — Assessment & Plan Note (Signed)
Referral back to orthopedics Dr. Fredna Dow I think he is going to need trigger finger release

## 2016-02-25 NOTE — Patient Instructions (Addendum)
Stop the appetite pill- Remeron Pain medication refilled appt with Dr. Fredna Dow  Schedule appointment with Dr. Gala Romney for April F/U Change F/U 4 months

## 2016-02-25 NOTE — Progress Notes (Signed)
Patient ID: Daniel Howard, male   DOB: [birth year], 60 y.o.   MRN: [id number]   Subjective:    Patient ID: Daniel Howard, male    DOB: August 04, 1956, 60 y.o.   MRN: [id number]  Patient presents for Medication Review/ Refill  patient here to follow-up medications. He requests a refill on his medication. He has Crohn's disease which she was briefly started on Humira for by gastroenterology. I have him on Remeron currently because of protein malnutrition and poor weight gain. He is now 20 pounds over the past year with this medication. He is on a half a tablet which is 7.5 mg daily  He continues Also with a trigger finger. He has not seen orthopedics. He did get a steroid injection here in the office in his right hand which helps some but he continues to have problems with triggering.    Review Of Systems:  GEN- denies fatigue, fever, weight loss,weakness, recent illness HEENT- denies eye drainage, change in vision, nasal discharge, CVS- denies chest pain, palpitations RESP- denies SOB, cough, wheeze ABD- denies N/V, change in stools, abd pain GU- denies dysuria, hematuria, dribbling, incontinence MSK- + joint pain, muscle aches, injury Neuro- denies headache, dizziness, syncope, seizure activity       Objective:    BP 118/72 mmHg  Pulse 78  Temp(Src) 98.3 F (36.8 C) (Oral)  Resp 12  Ht 5' 4"  (1.626 m)  Wt 155 lb (70.308 kg)  BMI 26.59 kg/m2 GEN- NAD, alert and oriented x3 CVS- RRR, no murmur RESP-CTAB ABD-NABS,soft, colostomy in tact  MSK- mild triggering Right hand index finger, left hand middle finger, mild swelling at MIP bilat  EXT- No edema       Assessment & Plan:      Problem List Items Addressed This Visit    Trigger finger, acquired - Primary    Referral back to orthopedics Dr. Fredna Dow I think he is going to need trigger finger release      Relevant Orders   Ambulatory referral to Orthopedic Surgery   Crohn's disease of both small and large intestine with  complication ([place])    His weight is significantly improved and by discontinuing the Remeron and see how he does on his own. He will follow-up with gastroenterology with regards to his Humira I refilled his pain medication for his chronic pain associated with his Crohn's         Note: This dictation was prepared with Dragon dictation along with smaller phrase technology. Any transcriptional errors that result from this process are unintentional.

## 2016-03-05 DIAGNOSIS — M65342 Trigger finger, left ring finger: Secondary | ICD-10-CM | POA: Diagnosis not present

## 2016-03-05 DIAGNOSIS — M65332 Trigger finger, left middle finger: Secondary | ICD-10-CM | POA: Diagnosis not present

## 2016-03-05 DIAGNOSIS — M65322 Trigger finger, left index finger: Secondary | ICD-10-CM | POA: Diagnosis not present

## 2016-03-05 DIAGNOSIS — M65321 Trigger finger, right index finger: Secondary | ICD-10-CM | POA: Diagnosis not present

## 2016-03-05 DIAGNOSIS — M65352 Trigger finger, left little finger: Secondary | ICD-10-CM | POA: Diagnosis not present

## 2016-04-02 DIAGNOSIS — M65321 Trigger finger, right index finger: Secondary | ICD-10-CM | POA: Diagnosis not present

## 2016-04-02 DIAGNOSIS — M65352 Trigger finger, left little finger: Secondary | ICD-10-CM | POA: Diagnosis not present

## 2016-04-02 DIAGNOSIS — M65322 Trigger finger, left index finger: Secondary | ICD-10-CM | POA: Diagnosis not present

## 2016-04-02 DIAGNOSIS — M65332 Trigger finger, left middle finger: Secondary | ICD-10-CM | POA: Diagnosis not present

## 2016-04-02 DIAGNOSIS — M65342 Trigger finger, left ring finger: Secondary | ICD-10-CM | POA: Diagnosis not present

## 2016-04-11 ENCOUNTER — Encounter: Payer: Self-pay | Admitting: Gastroenterology

## 2016-04-11 ENCOUNTER — Ambulatory Visit (INDEPENDENT_AMBULATORY_CARE_PROVIDER_SITE_OTHER): Payer: Medicare Other | Admitting: Gastroenterology

## 2016-04-11 VITALS — BP 113/72 | HR 73 | Temp 98.4°F | Ht 64.5 in | Wt 145.0 lb

## 2016-04-11 DIAGNOSIS — K50819 Crohn's disease of both small and large intestine with unspecified complications: Secondary | ICD-10-CM | POA: Diagnosis not present

## 2016-04-11 NOTE — Assessment & Plan Note (Signed)
Noted improvement on Humira, started 2 months ago. Plan for discontinuing Imuran when current prescription runs out. Due for lab work at this time. Need to consider surveillance of his remaining colon possibly imaging such as MR or CT although without contrast in remaining part of the colon results be less than ideal. Patient will follow-up with Dr. Gala Romney in 3 months with plans to discuss this issue at that time. He will call in the interim with any questions or concerns.

## 2016-04-11 NOTE — Progress Notes (Signed)
Primary Care Physician: Vic Blackbird, MD  Primary Gastroenterologist:  Garfield Cornea, MD   Chief Complaint  Patient presents with  . Follow-up    HPI: Daniel Howard is a 60 y.o. male here for follow up of ileocolonic Crohn's disease. Since his last office visit we started him on Humira. He has been on Humira for at least 2 months. He has noted improvement in his abdominal discomfort, energy level, decreased ostomy output. States he is working out at [employer] 3 times per week. He is much more active. No blood in his stool. Appetite is good. He is avoiding heavy lifting because of his history of hernia and ostomy.  Issue is been surveillance of his remaining colon because of rectal stricture. He has family history of colon cancer and Crohn's disease for over 3 decades He has been evaluated at [facility] in their input included options as outlined: surgical management of the rectal stricture allowing endoscopic surveillance of the residual colon, completion colectomy, or serial imaging studies which would be limited by the lack of oral contrast and difficulty in interpretation given his previous surgeries.     Background history Multiple prior abdominal surgeries including right hemicolectomy with ileostomy. Last seen in May 2016.Has been seen by Dr. Nyoka Cowden at [facility] within the past year. Dr. Nyoka Cowden recommended a colectomy and resumption of biologic therapy initially. The patient refused surgery. Issue has been that his left colon that remains cannot be surveilled adequately due to rectal stricture. At last f/u OV with Dr. Nyoka Cowden in 11/2014, he held off on starting Humira since he seemed to be doing well clinically.  Patient had proctoscopy September 2015 showing possible fistulous opening in the base of the rectal/anal stop. No attempt to cross this area and anyway. Scope was passed to the ostomy and immediately entered somewhat dilated, inflamed appearing small bowel with cobblestoning and  ulcers. Couple of nearly 1 cm inflammatory appearing polyps. Noted to have chronic active ileitis consistent with Crohn's on small bowel biopsy, small bowel polyp was inflammatory.  CT October 2015 show what appear to be significant amount of sigmoid colon left and may have a portion of colon in continuity with his ostomy. Patient reports that he passes mucus from his rectum at times.  MRA November 2015 severely limited by large amount of patient respiratory motion and insufficient to accurately access for areas of activity in setting of Crohn's disease. Noted to have large left-sided spigelian hernia containing several loops of small bowel, mildly dilated loop of bowel measuring 4.3 cm and inferior aspect of the patient's left spigelian hernia and likely different fold pattern and other loops of bowel in the hernia.   Current Outpatient Prescriptions  Medication Sig Dispense Refill  . azaTHIOprine (IMURAN) 50 MG tablet TAKE 1 TABLET BY MOUTH IN THE MORNING AND 1/2 TABLET IN THE EVENING 45 tablet 5  . calcium carbonate (OS-CAL) 600 MG TABS tablet Take 1,200 mg by mouth daily with breakfast.    . Ferrous Sulfate 28 MG TABS Take 1 tablet by mouth daily.    [name] HUMIRA PEN 40 MG/0.8ML PNKT     . HYDROcodone-acetaminophen (NORCO/VICODIN) 5-325 MG tablet Take 1 tablet by mouth every 6 (six) hours as needed for moderate pain. 60 tablet 0  . Multiple Vitamins-Minerals (CENTRUM SILVER ADULT 50+) TABS Take 1 tablet by mouth daily.     No current facility-administered medications for this visit.    Allergies as of 04/11/2016 - Review Complete 04/11/2016  Allergen Reaction Noted  . Penicillins Hives 01/25/2007    ROS:  General: Negative for anorexia, weight loss, fever, chills, fatigue, weakness. ENT: Negative for hoarseness, difficulty swallowing , nasal congestion. CV: Negative for chest pain, angina, palpitations, dyspnea on exertion, peripheral edema.  Respiratory: Negative for dyspnea at rest,  dyspnea on exertion, cough, sputum, wheezing.  GI: See history of present illness. GU:  Negative for dysuria, hematuria, urinary incontinence, urinary frequency, nocturnal urination.  Endo: Negative for unusual weight change.    Physical Examination:   BP 113/72 mmHg  Pulse 73  Temp(Src) 98.4 F (36.9 C) (Oral)  Ht 5' 4.5" (1.638 m)  Wt 145 lb (65.772 kg)  BMI 24.51 kg/m2  General: Well-nourished, well-developed in no acute distress.  Eyes: No icterus. Mouth: Oropharyngeal mucosa moist and pink , no lesions erythema or exudate. Lungs: Clear to auscultation bilaterally.  Heart: Regular rate and rhythm, no murmurs rubs or gallops.  Abdomen: Bowel sounds are normal, large hernia left abdomen, prolapse of stoma which is stable. Liquid brown stool present in bag. nontender, nondistended, no hepatosplenomegaly or masses, no abdominal bruits or hernia , no rebound or guarding.   Extremities: No lower extremity edema. No clubbing or deformities. Neuro: Alert and oriented x 4   Skin: Warm and dry, no jaundice.   Psych: Alert and cooperative, normal mood and affect.  Labs:  Lab Results  Component Value Date   CRP 1.8* 12/07/2015   Lab Results  Component Value Date   CREATININE 1.23 10/29/2015   BUN 15 10/29/2015   NA 139 10/29/2015   K 4.2 10/29/2015   CL 102 10/29/2015   CO2 28 10/29/2015   Lab Results  Component Value Date   ALT 6* 10/29/2015   AST 18 10/29/2015   ALKPHOS 60 10/29/2015   BILITOT 0.7 10/29/2015   Lab Results  Component Value Date   WBC 5.7 10/29/2015   HGB 12.5* 10/29/2015   HCT 37.6* 10/29/2015   MCV 96.4 10/29/2015   PLT 308 10/29/2015     Imaging Studies: No results found.

## 2016-04-11 NOTE — Patient Instructions (Signed)
1. Labs sometime in the next 1-2 weeks. 2. Stop Imuran when you complete current prescription bottle. 3. Return to the office in 3 months to see Dr. Gala Romney.

## 2016-04-14 NOTE — Progress Notes (Signed)
Excellent, updated, summation note on this very complicated patient.  I'm not sure it would be in the patient's best interest to attempt some type of surveillance maneuver for his sequestered colon (?how to further evaluate potential artifactual lesions if found on cross-sectional imaging). I certainly would not rule out attempts at future surveillance. If anything were to be found, it would be back to the [facility] for this patient. I agree, further discussions with the patient should be undertaken.

## 2016-04-14 NOTE — Progress Notes (Signed)
cc'ed to pcp °

## 2016-04-15 ENCOUNTER — Other Ambulatory Visit: Payer: Self-pay

## 2016-04-17 MED ORDER — ADALIMUMAB 40 MG/0.8ML ~~LOC~~ AJKT: 40.0000 mg | AUTO-INJECTOR | SUBCUTANEOUS | Status: DC

## 2016-04-30 ENCOUNTER — Ambulatory Visit (INDEPENDENT_AMBULATORY_CARE_PROVIDER_SITE_OTHER): Payer: Medicare Other | Admitting: Family Medicine

## 2016-04-30 ENCOUNTER — Encounter: Payer: Self-pay | Admitting: Family Medicine

## 2016-04-30 VITALS — BP 128/78 | HR 62 | Temp 98.9°F | Resp 16 | Ht 65.0 in | Wt 143.0 lb

## 2016-04-30 DIAGNOSIS — E43 Unspecified severe protein-calorie malnutrition: Secondary | ICD-10-CM

## 2016-04-30 DIAGNOSIS — K50819 Crohn's disease of both small and large intestine with unspecified complications: Secondary | ICD-10-CM | POA: Diagnosis not present

## 2016-04-30 MED ORDER — MIRTAZAPINE 15 MG PO TABS: 7.5000 mg | ORAL_TABLET | Freq: Every day | ORAL | Status: DC

## 2016-04-30 MED ORDER — HYDROCODONE-ACETAMINOPHEN 5-325 MG PO TABS: 1.0000 | ORAL_TABLET | Freq: Four times a day (QID) | ORAL | Status: DC | PRN

## 2016-04-30 NOTE — Progress Notes (Signed)
Patient ID: Daniel Howard, male   DOB: [birth year], 60 y.o.   MRN: [id number]   Subjective:    Patient ID: Daniel Howard, male    DOB: [birth year], 60 y.o.   MRN: [id number]  Patient presents for 6 month F/U  Pt here  follow-up. His history of Crohn's disease with protein on nutrition. Initially he was on Remeron I discontinued this back in March he has lost 12 pounds. He does state that he has been working out but he has not done a lot of cardio more weight lifting. He initially had some pain in both thighs when he started working out that has resolved.  His appetite did slack off after he stopped the medication. His other cancer prevention screening is up-to-date. He was seen by his gastroenterologist last month he is due for labs this Friday.    Review Of Systems:  GEN- denies fatigue, fever, weight loss,weakness, recent illness HEENT- denies eye drainage, change in vision, nasal discharge, CVS- denies chest pain, palpitations RESP- denies SOB, cough, wheeze ABD- denies N/V, change in stools, abd pain GU- denies dysuria, hematuria, dribbling, incontinence MSK- denies joint pain, muscle aches, injury Neuro- denies headache, dizziness, syncope, seizure activity       Objective:    BP 128/78 mmHg  Pulse 62  Temp(Src) 98.9 F (37.2 C) (Oral)  Resp 16  Ht 5' 5"  (1.651 m)  Wt 143 lb (64.864 kg)  BMI 23.80 kg/m2 GEN- NAD, alert and oriented x3 HEENT- PERRL, EOMI, non injected sclera, pink conjunctiva, MMM, oropharynx clear Neck- Supple, no thyromegaly CVS- RRR, no murmur RESP-CTAB ABD-NABS,soft,NT,ND, colostomy in tact  EXT- No edema Pulses- Radial 2+        Assessment & Plan:      Problem List Items Addressed This Visit    Protein-calorie malnutrition, severe ([place])   Crohn's disease of both small and large intestine with complication ([place]) - Primary    Severe crohns with difficulty with absorption He did better with remeron, will restart at 7.54m at bedtime I will  f/u his labs to be done by GI on Friday. Otherwise normal exam          Note: This dictation was prepared with Dragon dictation along with smaller phrase technology. Any transcriptional errors that result from this process are unintentional.

## 2016-04-30 NOTE — Patient Instructions (Addendum)
Restart the remeron for your weight Increase the protein  F/U 2 months

## 2016-04-30 NOTE — Assessment & Plan Note (Signed)
Severe crohns with difficulty with absorption He did better with remeron, will restart at 7.102m at bedtime I will f/u his labs to be done by GI on Friday. Otherwise normal exam

## 2016-04-30 NOTE — Assessment & Plan Note (Signed)
He needs at least 4-5 servings of protein, he can not tolerate boost/ensure, seems to cause more output of colostomy

## 2016-05-02 DIAGNOSIS — K50819 Crohn's disease of both small and large intestine with unspecified complications: Secondary | ICD-10-CM | POA: Diagnosis not present

## 2016-05-02 LAB — CBC WITH DIFFERENTIAL/PLATELET
BASOS PCT: 1 %
Basophils Absolute: 74 cells/uL (ref 0–200)
EOS ABS: 222 {cells}/uL (ref 15–500)
Eosinophils Relative: 3 %
HEMATOCRIT: 40.5 % (ref 38.5–50.0)
Hemoglobin: 13.5 g/dL (ref 13.2–17.1)
LYMPHS PCT: 29 %
Lymphs Abs: 2146 cells/uL (ref 850–3900)
MCH: 33.2 pg — ABNORMAL HIGH (ref 27.0–33.0)
MCHC: 33.3 g/dL (ref 32.0–36.0)
MCV: 99.5 fL (ref 80.0–100.0)
MONO ABS: 592 {cells}/uL (ref 200–950)
MONOS PCT: 8 %
MPV: 8.7 fL (ref 7.5–12.5)
NEUTROS PCT: 59 %
Neutro Abs: 4366 cells/uL (ref 1500–7800)
PLATELETS: 297 10*3/uL (ref 140–400)
RBC: 4.07 MIL/uL — AB (ref 4.20–5.80)
RDW: 13.4 % (ref 11.0–15.0)
WBC: 7.4 10*3/uL (ref 3.8–10.8)

## 2016-05-03 LAB — COMPREHENSIVE METABOLIC PANEL
ALBUMIN: 3.6 g/dL (ref 3.6–5.1)
ALT: 10 U/L (ref 9–46)
AST: 19 U/L (ref 10–35)
Alkaline Phosphatase: 48 U/L (ref 40–115)
BILIRUBIN TOTAL: 0.6 mg/dL (ref 0.2–1.2)
BUN: 17 mg/dL (ref 7–25)
CO2: 25 mmol/L (ref 20–31)
CREATININE: 1.24 mg/dL (ref 0.70–1.33)
Calcium: 8.7 mg/dL (ref 8.6–10.3)
Chloride: 103 mmol/L (ref 98–110)
Glucose, Bld: 76 mg/dL (ref 65–99)
Potassium: 5 mmol/L (ref 3.5–5.3)
SODIUM: 136 mmol/L (ref 135–146)
TOTAL PROTEIN: 7.2 g/dL (ref 6.1–8.1)

## 2016-05-03 LAB — C-REACTIVE PROTEIN: CRP: 2.6 mg/dL — ABNORMAL HIGH (ref <0.60)

## 2016-05-06 NOTE — Progress Notes (Signed)
Quick Note:  Also plan for repeat labs in four months, CMET, CBC, CRP ______

## 2016-05-06 NOTE — Progress Notes (Signed)
Quick Note:  Please let patient know his labs are improved with regards to hgb. His kidney and liver function look good. His CRP, indicated inflammation, is still elevated.  Noted Dr. [name] restarted Remeron for weight loss.   Please make follow up appointment with RMR only in about 6 weeks. ______

## 2016-05-07 ENCOUNTER — Encounter: Payer: Self-pay | Admitting: Internal Medicine

## 2016-05-07 ENCOUNTER — Other Ambulatory Visit: Payer: Self-pay | Admitting: Gastroenterology

## 2016-05-07 DIAGNOSIS — K50819 Crohn's disease of both small and large intestine with unspecified complications: Secondary | ICD-10-CM

## 2016-05-22 ENCOUNTER — Other Ambulatory Visit: Payer: Self-pay

## 2016-05-23 MED ORDER — ADALIMUMAB 40 MG/0.8ML ~~LOC~~ AJKT: 40.0000 mg | AUTO-INJECTOR | SUBCUTANEOUS | Status: DC

## 2016-06-13 ENCOUNTER — Encounter: Payer: Self-pay | Admitting: Internal Medicine

## 2016-06-13 ENCOUNTER — Ambulatory Visit (INDEPENDENT_AMBULATORY_CARE_PROVIDER_SITE_OTHER): Payer: Medicare Other | Admitting: Internal Medicine

## 2016-06-13 VITALS — BP 100/63 | HR 76 | Temp 97.2°F | Ht 64.0 in | Wt 138.2 lb

## 2016-06-13 DIAGNOSIS — R634 Abnormal weight loss: Secondary | ICD-10-CM

## 2016-06-13 DIAGNOSIS — Z79899 Other long term (current) drug therapy: Secondary | ICD-10-CM | POA: Diagnosis not present

## 2016-06-13 DIAGNOSIS — K50813 Crohn's disease of both small and large intestine with fistula: Secondary | ICD-10-CM | POA: Diagnosis not present

## 2016-06-13 NOTE — Patient Instructions (Addendum)
Lactose free / Crohns disease diet  Continue Humira indefinitely  Check with Dr. [name] regarding a bone density study  Will check a B12 level by next office visit  MRE of GI tract in 1 year  Office visit in 6 months

## 2016-06-13 NOTE — Progress Notes (Signed)
Primary Care Physician:  Vic Blackbird, MD Primary Gastroenterologist:  Dr. Gala Romney  Pre-Procedure History & Physical: HPI:  Daniel Howard is a 60 y.o. male here for for follow-up complicated Crohn disease of large and small intestine. He has a permanent ostomy. He is now on Humira and tolerating this medication very well. His sense of well-being is very good;  he only has occasional diarrhea. No abdominal pain.  No nausea or vomiting. He is going to the gym regularly. He has lost 7 pounds, however, since his last visit here. He and his wife attribute that to getting more physical activity. CRP was still up at 2.6 with recent labs LFTs okay. He has had no fever or other constitutional symptoms. He would like to eat ice cream or regularly he gets bloating and diarrhea when he tries to consume dairy products.  Past Medical History  Diagnosis Date  . Anxiety   . Low back pain   . 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 Abnormal finding of biliary tract     MRCP shows pancreatic/biliary tract dilation. EUS 2010 confirmed dilation but no chronic pancreaitis or mass. Vascular ectasia crimpoing distal CBD.   [name] Spigelian hernia     bilateral  . Duodenal ulcer 2010    nsaids  . Peristomal hernia   . Small bowel obstruction [facility])     Past Surgical History  Procedure Laterality Date  . Colostomy  1992    transverse loop colostomy secondary to a stricture  . Hemicoloectomy w/ anastomosis  1993    R- Dr.DeMason   . Hernia repair  1996    incarcerated periostial hernia with additional surgery in 1999  . Esophagogastroduodenoscopy  05/2009    SLF:  multiple antral erosions, large ulcer at ansatomosis (postsurgical changes at duodenal bulb and second portion of duodenum) BX c/x NSAIDS.  [name] Flexible sigmoidoscopy  1988    Dr. Laural Golden- suggested rohn's disease but the biopsies were not collaborative.  [name] Appendectomy  1992    at time of exp laparotomy at which time he was noted to have fistulizing Crohn's rather than UC  . Eus  10/04/2009    Dr. Estill Bakes dilated CBD and main pancreatic duct.  No pancreatic  . Colonoscopy [name] 08/23/2014    [id number] proctoscopy with possible fistulous opening in thebase of rectal/anal stump.      Prior to Admission medications   Medication Sig Start Date End Date Taking? Authorizing Provider  Adalimumab (HUMIRA PEN) 40 MG/0.8ML PNKT Inject 40 mg into the skin every 14 (fourteen) days. 05/23/16  Yes Mahala Menghini, PA-C  calcium carbonate (OS-CAL) 600 MG TABS tablet Take 1,200 mg by mouth daily with breakfast.   Yes Historical Provider, MD  Ferrous Sulfate 28 MG TABS Take 1 tablet by mouth daily.   Yes Historical Provider, MD  HYDROcodone-acetaminophen (NORCO/VICODIN) 5-325 MG tablet Take 1 tablet by mouth every 6 (six) hours as needed for moderate pain. 04/30/16  Yes Alycia Rossetti, MD  mirtazapine (REMERON) 15 MG tablet Take 0.5 tablets (7.5 mg total) by mouth at bedtime. 04/30/16  Yes Alycia Rossetti, MD  Multiple Vitamins-Minerals (CENTRUM SILVER ADULT 50+) TABS Take 1 tablet  by mouth daily.   Yes Historical Provider, MD  azaTHIOprine (IMURAN) 50 MG tablet Reported on 06/13/2016 03/19/16   Historical Provider, MD    Allergies as of 06/13/2016 - Review Complete 06/13/2016  Allergen Reaction Noted  . Penicillins Hives 01/25/2007    Family History  Problem Relation Age of Onset  . Cancer Father     prostate   . Prostate cancer Father   . Hypertension Sister   . Cancer Sister   . Depression Sister   . Breast cancer Sister   . COPD Sister   . Aneurysm Brother     deceased, brain aneurysm     Social History   Social History  . Marital Status: Married    Spouse Name: [name]  . Number of Children: 76  . Years of Education: [name]   Occupational History  . disabled    Social History Main Topics  . Smoking status: Former Smoker -- 0.50 packs/day for 2 years    Types: Cigarettes    Quit date: 12/21/2009  . Smokeless tobacco: Never Used     Comment: Quit abut 20 years  . Alcohol Use: No     Comment: Former drinker  . Drug Use: No  . Sexual Activity:    Partners: Female   Other Topics Concern  . Not on file   Social History Narrative    Review of Systems: See HPI, otherwise negative ROS  Physical Exam: BP 100/63 mmHg  Pulse 76  Temp(Src) 97.2 F (36.2 C)  Ht 5' 4"  (1.626 m)  Wt 138 lb 3.2 oz (62.687 kg)  BMI 23.71 kg/m2 General:   Alert,  Well-developed,pleasant and cooperative in NAD.  Accompanied by spouse Skin:  Intact without significant lesions or rashes. Eyes:  Sclera clear, no icterus.   Conjunctiva pink. Ears:  Normal auditory acuity. Nose:  No deformity, discharge,  or lesions. Mouth:  No deformity or lesions. Neck:  Supple; no masses or thyromegaly. No significant cervical adenopathy. Lungs:  Clear throughout to auscultation.   No wheezes, crackles, or rhonchi. No acute distress. Heart:  Regular rate and rhythm; no murmurs, clicks, rubs,  or gallops. Abdomen:  Ostomy looks good left lower quadrant prominent keloid scars present. Large area of bowel wall defect consistent with gallium/ventral hernia. Soft and non-tender. No obvious mass.Pulses:  Normal pulses noted. Extremities:  Without clubbing or edema.  Impression/:  History of extremely complicated Crohn's disease with the SpO2 fistula formation status post multiple surgeries. Overall, doing now very well biologic therapy in the way of . His B12 level was in the 600 range 4 years ago. We cannot find a bone density study on him. MRV done as a baseline previously as surveillance his residual  GI  tract not feasible directly with the scope. Moving forward our best bet at colorectal cancer screening would be periodic MRE's  As this would give [place] a "apples to apples" modality to image his residual GI tract.  Recent weight loss likely due to increased physical activity.  Recommendations:  Lactose free / Crohns disease diet information provided;  May try a lactase supplement when attempting to consume dairy products.  Continue Humira indefinitely  Check with Dr. [name] regarding a bone density study; goal is to get one done within the next year.  Will check a B12 level at next office visit  MRE of GI tract in 1 year  Office visit in 6 months   Notice: This dictation was prepared with Dragon dictation along with  smaller phrase technology. Any transcriptional errors that result from this process are unintentional and may not be corrected upon review.

## 2016-07-01 ENCOUNTER — Encounter: Payer: Self-pay | Admitting: Family Medicine

## 2016-07-01 ENCOUNTER — Ambulatory Visit (INDEPENDENT_AMBULATORY_CARE_PROVIDER_SITE_OTHER): Payer: Medicare Other | Admitting: Family Medicine

## 2016-07-01 VITALS — BP 104/64 | HR 68 | Temp 98.7°F | Resp 14 | Ht 64.0 in | Wt 141.0 lb

## 2016-07-01 DIAGNOSIS — E43 Unspecified severe protein-calorie malnutrition: Secondary | ICD-10-CM

## 2016-07-01 DIAGNOSIS — Z13820 Encounter for screening for osteoporosis: Secondary | ICD-10-CM | POA: Diagnosis not present

## 2016-07-01 DIAGNOSIS — Z79899 Other long term (current) drug therapy: Secondary | ICD-10-CM | POA: Diagnosis not present

## 2016-07-01 DIAGNOSIS — M859 Disorder of bone density and structure, unspecified: Secondary | ICD-10-CM

## 2016-07-01 MED ORDER — HYDROCODONE-ACETAMINOPHEN 5-325 MG PO TABS: 1.0000 | ORAL_TABLET | Freq: Four times a day (QID) | ORAL | Status: DC | PRN

## 2016-07-01 MED ORDER — MIRTAZAPINE 15 MG PO TABS: 15.0000 mg | ORAL_TABLET | Freq: Every day | ORAL | Status: DC

## 2016-07-01 NOTE — Progress Notes (Signed)
Patient ID: Daniel Howard, male   DOB: [birth year], 58 y.o.   MRN: [id number]    Subjective:    Patient ID: Daniel Howard, male    DOB: [birth year], 60 y.o.   MRN: [id number]  Patient presents for 2 month F/U Patient to follow-up weight loss. He has protein malnutrition in the setting of his chronic Crohn's disease. Her last visit we restarted Remeron he was previously on but we discontinued as he was doing well with his weight he had lost 12 pounds at her last visit he was 155 in March she will return at 143 pounds today he is at 141 pounds. He is currently on 15 mg of Remeron. His recent labs were reviewed from his gastroenterologist I also reviewed the note which stated that he needs a bone density is high risk for osteoporosis with his medications and his protein malnutrition  Review Of Systems:  GEN- denies fatigue, fever, weight loss,weakness, recent illness HEENT- denies eye drainage, change in vision, nasal discharge, CVS- denies chest pain, palpitations RESP- denies SOB, cough, wheeze ABD- denies N/V, change in stools, abd pain GU- denies dysuria, hematuria, dribbling, incontinence MSK- denies joint pain, muscle aches, injury Neuro- denies headache, dizziness, syncope, seizure activity       Objective:    BP 104/64 mmHg  Pulse 68  Temp(Src) 98.7 F (37.1 C) (Oral)  Resp 14  Ht 5' 4"  (1.626 m)  Wt 141 lb (63.957 kg)  BMI 24.19 kg/m2 GEN- NAD, alert and oriented x3 CVS- RRR, no murmur RESP-CTAB ABD-NABS,soft,NT,ND EXT- No edema Pulses- Radial 2+        Assessment & Plan:      Problem List Items Addressed This Visit    Protein-calorie malnutrition, severe ([place])    Weight is steady today. He is also exercising which I think is also affecting some of his metabolism. I may keep him on the Remeron 15 mg once a day. His recent labs look good.       Other Visit Diagnoses    High risk medication use    -  Primary    Relevant Orders    DG Bone Density    Osteoporosis screening        With his long-term use of the Humira and his protein malnutrition Will obtain bone density    Relevant Orders    DG Bone Density    Disorder of bone density and structure, unspecified        Relevant Orders    DG Bone Density       Note: This dictation was prepared with Dragon dictation along with smaller phrase technology. Any transcriptional errors that result from this process are unintentional.

## 2016-07-01 NOTE — Assessment & Plan Note (Signed)
Weight is steady today. He is also exercising which I think is also affecting some of his metabolism. I may keep him on the Remeron 15 mg once a day. His recent labs look good.

## 2016-07-01 NOTE — Patient Instructions (Addendum)
Call and schedule your Bone Density  Remeron take the entire pill of remeron  F/U 4 months

## 2016-07-29 ENCOUNTER — Other Ambulatory Visit: Payer: Self-pay | Admitting: *Deleted

## 2016-07-29 MED ORDER — MIRTAZAPINE 15 MG PO TABS: 15.0000 mg | ORAL_TABLET | Freq: Every day | ORAL | 1 refills | Status: DC

## 2016-07-29 NOTE — Telephone Encounter (Signed)
Received fax requesting refill on Remeron.   Refill appropriate and filled per protocol.

## 2016-08-18 ENCOUNTER — Other Ambulatory Visit: Payer: Self-pay

## 2016-08-18 DIAGNOSIS — K50819 Crohn's disease of both small and large intestine with unspecified complications: Secondary | ICD-10-CM

## 2016-08-29 DIAGNOSIS — K50819 Crohn's disease of both small and large intestine with unspecified complications: Secondary | ICD-10-CM | POA: Diagnosis not present

## 2016-08-30 ENCOUNTER — Emergency Department (HOSPITAL_COMMUNITY)
Admission: EM | Admit: 2016-08-30 | Discharge: 2016-08-31 | Disposition: A | Discharge status: Home / Self Care | Payer: Medicare Other | Attending: Emergency Medicine | Admitting: Emergency Medicine

## 2016-08-30 ENCOUNTER — Encounter (HOSPITAL_COMMUNITY): Payer: Self-pay | Admitting: Emergency Medicine

## 2016-08-30 DIAGNOSIS — R197 Diarrhea, unspecified: Secondary | ICD-10-CM | POA: Diagnosis not present

## 2016-08-30 DIAGNOSIS — Z87891 Personal history of nicotine dependence: Secondary | ICD-10-CM | POA: Insufficient documentation

## 2016-08-30 LAB — URINALYSIS, ROUTINE W REFLEX MICROSCOPIC
Glucose, UA: NEGATIVE mg/dL
Ketones, ur: 15 mg/dL — AB
LEUKOCYTES UA: NEGATIVE
NITRITE: NEGATIVE
Protein, ur: NEGATIVE mg/dL
SPECIFIC GRAVITY, URINE: 1.031 — AB (ref 1.005–1.030)
pH: 5.5 (ref 5.0–8.0)

## 2016-08-30 LAB — COMPREHENSIVE METABOLIC PANEL
ALBUMIN: 3.8 g/dL (ref 3.6–5.1)
ALK PHOS: 48 U/L (ref 38–126)
ALT: 14 U/L (ref 9–46)
ALT: 15 U/L — ABNORMAL LOW (ref 17–63)
ANION GAP: 8 (ref 5–15)
AST: 21 U/L (ref 10–35)
AST: 20 U/L (ref 15–41)
Albumin: 3.4 g/dL — ABNORMAL LOW (ref 3.5–5.0)
Alkaline Phosphatase: 50 U/L (ref 40–115)
BILIRUBIN TOTAL: 1 mg/dL (ref 0.2–1.2)
BILIRUBIN TOTAL: 1.1 mg/dL (ref 0.3–1.2)
BUN: 16 mg/dL (ref 7–25)
BUN: 11 mg/dL (ref 6–20)
CALCIUM: 10 mg/dL (ref 8.6–10.3)
CALCIUM: 9.4 mg/dL (ref 8.9–10.3)
CO2: 26 mmol/L (ref 20–31)
CO2: 28 mmol/L (ref 22–32)
Chloride: 98 mmol/L (ref 98–110)
Chloride: 99 mmol/L — ABNORMAL LOW (ref 101–111)
Creat: 1.38 mg/dL — ABNORMAL HIGH (ref 0.70–1.33)
Creatinine, Ser: 1.4 mg/dL — ABNORMAL HIGH (ref 0.61–1.24)
GFR calc non Af Amer: 54 mL/min — ABNORMAL LOW (ref >60)
GLUCOSE: 102 mg/dL — AB (ref 65–99)
Glucose, Bld: 90 mg/dL (ref 65–99)
POTASSIUM: 4.9 mmol/L (ref 3.5–5.3)
Potassium: 4.5 mmol/L (ref 3.5–5.1)
SODIUM: 135 mmol/L (ref 135–145)
Sodium: 136 mmol/L (ref 135–146)
TOTAL PROTEIN: 7.7 g/dL (ref 6.5–8.1)
Total Protein: 8.2 g/dL — ABNORMAL HIGH (ref 6.1–8.1)

## 2016-08-30 LAB — CBC WITH DIFFERENTIAL/PLATELET
Basophils Absolute: 0 {cells}/uL (ref 0–200)
Basophils Relative: 0 %
Eosinophils Absolute: 65 {cells}/uL (ref 15–500)
Eosinophils Relative: 1 %
HCT: 42.8 % (ref 38.5–50.0)
Hemoglobin: 14.1 g/dL (ref 13.2–17.1)
Lymphocytes Relative: 25 %
Lymphs Abs: 1625 {cells}/uL (ref 850–3900)
MCH: 31.7 pg (ref 27.0–33.0)
MCHC: 32.9 g/dL (ref 32.0–36.0)
MCV: 96.2 fL (ref 80.0–100.0)
MPV: 9.1 fL (ref 7.5–12.5)
Monocytes Absolute: 1300 {cells}/uL — ABNORMAL HIGH (ref 200–950)
Monocytes Relative: 20 %
Neutro Abs: 3510 {cells}/uL (ref 1500–7800)
Neutrophils Relative %: 54 %
Platelets: 389 K/uL (ref 140–400)
RBC: 4.45 MIL/uL (ref 4.20–5.80)
RDW: 13.6 % (ref 11.0–15.0)
WBC: 6.5 K/uL (ref 3.8–10.8)

## 2016-08-30 LAB — CBC
HCT: 38.1 % — ABNORMAL LOW (ref 39.0–52.0)
HEMOGLOBIN: 12.6 g/dL — AB (ref 13.0–17.0)
MCH: 31.3 pg (ref 26.0–34.0)
MCHC: 33.1 g/dL (ref 30.0–36.0)
MCV: 94.5 fL (ref 78.0–100.0)
Platelets: 367 10*3/uL (ref 150–400)
RBC: 4.03 MIL/uL — AB (ref 4.22–5.81)
RDW: 13.1 % (ref 11.5–15.5)
WBC: 6.6 10*3/uL (ref 4.0–10.5)

## 2016-08-30 LAB — URINE MICROSCOPIC-ADD ON: BACTERIA UA: NONE SEEN

## 2016-08-30 LAB — C-REACTIVE PROTEIN: CRP: 6.7 mg/dL — ABNORMAL HIGH (ref <0.60)

## 2016-08-30 LAB — LIPASE, BLOOD: Lipase: 22 U/L (ref 11–51)

## 2016-08-30 MED ORDER — SODIUM CHLORIDE 0.9 % IV BOLUS (SEPSIS)
1000.0000 mL | Freq: Once | INTRAVENOUS | Status: AC
  Administered 2016-08-31: 1000 mL via INTRAVENOUS

## 2016-08-30 NOTE — ED Triage Notes (Signed)
Patient with diarrhea.  Patient has Crohn's Disease.  He states that he is no longer having cramps at this time or diarrhea, but his mother wants him to get checked.  No abdominal pain at this time.

## 2016-08-31 DIAGNOSIS — R197 Diarrhea, unspecified: Secondary | ICD-10-CM | POA: Diagnosis not present

## 2016-08-31 NOTE — ED Provider Notes (Signed)
[facility] DEPT Provider Note   CSN: [id number] Arrival date & time: 08/30/16  1943     History   Chief Complaint Chief Complaint  Patient presents with  . Diarrhea    HPI Daniel Howard is a 60 y.o. male the past medical history of Crohn's disease, SBO, multiple abdominal surgeries with colostomy bag presents to the ED today complaining of diarrhea. Patient states that 3 days ago he began having diarrhea in his colostomy bag and lower abdominal cramping. He took Imodium and his symptoms improved. Patient also had one episode of nonbloody, nonbilious emesis 2 days ago. Patient is currently asymptomatic. However, his mother became worried about him and encouraged him to go to the hospital to be evaluated. Patient denies any fevers, chills, belly pain, dysuria, melena, hematochezia. Patient sees Dr.Rorick in [place] for gastroenterology. Patient takes Humira for his Crohn's disease and reports compliance with his medication.  HPI  Past Medical History:  Diagnosis Date  . Abnormal finding of biliary tract    MRCP shows pancreatic/biliary tract dilation. EUS 2010 confirmed dilation but no chronic pancreaitis or mass. Vascular ectasia crimpoing distal CBD.   [name] Anxiety   . 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 Duodenal ulcer 2010   nsaids  . Low back pain   . Peristomal hernia   . Small bowel obstruction ([place])   . Spigelian hernia    bilateral    Patient Active Problem List   Diagnosis Date Noted  . Trigger finger, acquired 10/31/2015  . Protein-calorie malnutrition, severe ([place]) 07/27/2014  . SBO (small bowel obstruction) ([place])  07/26/2014  . Loss of weight 06/09/2014  . Hypotension, unspecified 06/09/2014  . Crohn's disease of both small and large intestine with complication ([place]) 30/16/0109  . Boils 02/11/2013  . Ventral hernia 12/11/2009  . Anemia 12/03/2009  . 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 LOW BACK PAIN 01/25/2007    Past Surgical History:  Procedure Laterality Date  . APPENDECTOMY  1992   at time of exp laparotomy at which time he was noted to have fistulizing Crohn's rather than UC  . COLONOSCOPY [name] 08/23/2014   [id number] proctoscopy with possible fistulous opening in thebase of rectal/anal stump.    . COLOSTOMY  1992   transverse loop colostomy secondary to a stricture  . ESOPHAGOGASTRODUODENOSCOPY  05/2009   SLF: multiple antral erosions, large ulcer at ansatomosis (postsurgical changes at duodenal bulb and second portion of duodenum) BX c/x NSAIDS.  . EUS  10/04/2009   Dr. Estill Bakes dilated CBD and main pancreatic duct.  No pancreatic  . FLEXIBLE SIGMOIDOSCOPY  1988   Dr. Laural Golden- suggested rohn's disease but the biopsies were not collaborative.  [name] HEMICOLOECTOMY W/ ANASTOMOSIS  1993   R- Dr.DeMason   . [place]   incarcerated periostial hernia with additional surgery in [place] Medications    Prior to Admission medications   Medication Sig Start Date End Date Taking? Authorizing Provider  Adalimumab (HUMIRA PEN) 40 MG/0.8ML PNKT Inject 40 mg into the skin every 14 (fourteen) days. 05/23/16   Mahala Menghini, PA-C  calcium carbonate (OS-CAL) 600 MG TABS tablet Take 1,200 mg by  mouth daily with breakfast.    Historical Provider, MD  Ferrous Sulfate 28 MG TABS Take 1 tablet by mouth daily.    Historical Provider, MD  HYDROcodone-acetaminophen (NORCO/VICODIN) 5-325 MG tablet Take 1 tablet by mouth every 6 (six) hours as needed for moderate pain. 07/01/16   Alycia Rossetti, MD  mirtazapine (REMERON) 15 MG tablet Take 1 tablet (15 mg total) by mouth at bedtime. 07/29/16    Alycia Rossetti, MD  Multiple Vitamins-Minerals (CENTRUM SILVER ADULT 50+) TABS Take 1 tablet by mouth daily.    Historical Provider, MD    Family History Family History  Problem Relation Age of Onset  . Cancer Father     prostate   . Prostate cancer Father   . Hypertension Sister   . Cancer Sister   . Depression Sister   . Breast cancer Sister   . COPD Sister   . Aneurysm Brother     deceased, brain aneurysm    Social History Social History  Substance Use Topics  . Smoking status: Former Smoker    Packs/day: 0.50    Years: 2.00    Types: Cigarettes    Quit date: 12/21/2009  . Smokeless tobacco: Never Used     Comment: Quit abut 20 years  . Alcohol use No     Comment: Former drinker     Allergies   Penicillins   Review of Systems Review of Systems  All other systems reviewed and are negative.    Physical Exam Updated Vital Signs BP 105/78   Pulse 79   Temp 98.7 F (37.1 C) (Oral)   Resp 19   SpO2 98%   Physical Exam  Constitutional: He is oriented to person, place, and time. He appears well-developed and well-nourished. No distress.  HENT:  Head: Normocephalic and atraumatic.  Mouth/Throat: No oropharyngeal exudate.  Eyes: Conjunctivae and EOM are normal. Pupils are equal, round, and reactive to light. Right eye exhibits no discharge. Left eye exhibits no discharge. No scleral icterus.  Cardiovascular: Normal rate, regular rhythm, normal heart sounds and intact distal pulses.  Exam reveals no gallop and no friction rub.   No murmur heard. Pulmonary/Chest: Effort normal and breath sounds normal. No respiratory distress. He has no wheezes. He has no rales. He exhibits no tenderness.  Abdominal: Soft. Bowel sounds are normal. He exhibits no distension and no mass. There is no tenderness. There is no rebound and no guarding. No hernia.  Colostomy bag without diarrhea, no sign of infection around site. Well-healed abdominal scarring present.    Musculoskeletal: Normal range of motion. He exhibits no edema.  Neurological: He is alert and oriented to person, place, and time.  Skin: Skin is warm and dry. No rash noted. He is not diaphoretic. No erythema. No pallor.  Psychiatric: He has a normal mood and affect. His behavior is normal.  Nursing note and vitals reviewed.    ED Treatments / Results  Labs (all labs ordered are listed, but only abnormal results are displayed) Labs Reviewed  COMPREHENSIVE METABOLIC PANEL - Abnormal; Notable for the following:       Result Value   Chloride 99 (*)    Creatinine, Ser 1.40 (*)    Albumin 3.4 (*)    ALT 15 (*)    GFR calc non Af Amer 54 (*)    All other components within normal limits  CBC - Abnormal; Notable for the following:    RBC 4.03 (*)    Hemoglobin 12.6 (*)  HCT 38.1 (*)    All other components within normal limits  URINALYSIS, ROUTINE W REFLEX MICROSCOPIC (NOT AT [facility]) - Abnormal; Notable for the following:    Color, Urine AMBER (*)    Specific Gravity, Urine 1.031 (*)    Hgb urine dipstick TRACE (*)    Bilirubin Urine SMALL (*)    Ketones, ur 15 (*)    All other components within normal limits  URINE MICROSCOPIC-ADD ON - Abnormal; Notable for the following:    Squamous Epithelial / LPF 0-5 (*)    Crystals CA OXALATE CRYSTALS (*)    All other components within normal limits  LIPASE, BLOOD    EKG  EKG Interpretation None       Radiology No results found.  Procedures Procedures (including critical care time)  Medications Ordered in ED Medications  sodium chloride 0.9 % bolus 1,000 mL (not administered)     Initial Impression / Assessment and Plan / ED Course  I have reviewed the triage vital signs and the nursing notes.  Pertinent labs & imaging results that were available during my care of the patient were reviewed by me and considered in my medical decision making (see chart for details).  Clinical Course    60 year old male with history of  Crohn's presents to the ED today with complaints of diarrhea. Patient states he had several episodes of diarrhea 3 days ago that was relieved with Imodium. Patient is only in the ED per the request of his mother for evaluation. Patient is currently asymptomatic. He is afebrile and all vitals are stable. No abdominal pain. Colostomy site without signs of infection or diarrhea. No melena or hematochezia. Do not feel imaging is indicated at this time. Lab work reveals mild signs of dehydration, increased specific gravity and mild bump in creatinine to 1.4. Patient's baseline appears to be 1.3. Patient given 1 L of fluids in the ED. The patient is safe for discharge with GI follow-up. He gastroenterologist is Dr.Rourk in [place]. Patient reports compliance with his Humira. Strict return percussion given and discussed.  Final Clinical Impressions(s) / ED Diagnoses   Final diagnoses:  Diarrhea, unspecified type    New Prescriptions New Prescriptions   No medications on file     Carlos Levering, PA-C 08/31/16 0220    Ripley Fraise, MD 08/31/16 [phone]

## 2016-08-31 NOTE — Discharge Instructions (Signed)
Continue taking immodium as needed for diarrhea. Follow up with your GI provider for re-evaluation. Return to the ED if you experience blood in stool, fevers, vomiting, abdominal pain.

## 2016-09-04 ENCOUNTER — Encounter (HOSPITAL_COMMUNITY): Payer: Self-pay

## 2016-09-04 ENCOUNTER — Emergency Department (HOSPITAL_COMMUNITY): Payer: Medicare Other

## 2016-09-04 ENCOUNTER — Inpatient Hospital Stay (HOSPITAL_COMMUNITY)
Admission: EM | Admit: 2016-09-04 | Discharge: 2016-09-09 | DRG: 385 | Disposition: A | Discharge status: Home / Self Care | Payer: Medicare Other | Attending: Internal Medicine | Admitting: Internal Medicine

## 2016-09-04 DIAGNOSIS — Z8042 Family history of malignant neoplasm of prostate: Secondary | ICD-10-CM | POA: Diagnosis not present

## 2016-09-04 DIAGNOSIS — D509 Iron deficiency anemia, unspecified: Secondary | ICD-10-CM | POA: Diagnosis present

## 2016-09-04 DIAGNOSIS — Z800 Family history of malignant neoplasm of digestive organs: Secondary | ICD-10-CM | POA: Diagnosis not present

## 2016-09-04 DIAGNOSIS — K5090 Crohn's disease, unspecified, without complications: Secondary | ICD-10-CM | POA: Diagnosis not present

## 2016-09-04 DIAGNOSIS — Z825 Family history of asthma and other chronic lower respiratory diseases: Secondary | ICD-10-CM

## 2016-09-04 DIAGNOSIS — K2970 Gastritis, unspecified, without bleeding: Secondary | ICD-10-CM | POA: Diagnosis not present

## 2016-09-04 DIAGNOSIS — Z87891 Personal history of nicotine dependence: Secondary | ICD-10-CM | POA: Diagnosis not present

## 2016-09-04 DIAGNOSIS — R198 Other specified symptoms and signs involving the digestive system and abdomen: Secondary | ICD-10-CM

## 2016-09-04 DIAGNOSIS — K50812 Crohn's disease of both small and large intestine with intestinal obstruction: Secondary | ICD-10-CM | POA: Diagnosis not present

## 2016-09-04 DIAGNOSIS — E43 Unspecified severe protein-calorie malnutrition: Secondary | ICD-10-CM | POA: Diagnosis present

## 2016-09-04 DIAGNOSIS — E860 Dehydration: Secondary | ICD-10-CM | POA: Diagnosis present

## 2016-09-04 DIAGNOSIS — R935 Abnormal findings on diagnostic imaging of other abdominal regions, including retroperitoneum: Secondary | ICD-10-CM | POA: Diagnosis not present

## 2016-09-04 DIAGNOSIS — R1010 Upper abdominal pain, unspecified: Secondary | ICD-10-CM | POA: Diagnosis not present

## 2016-09-04 DIAGNOSIS — D490 Neoplasm of unspecified behavior of digestive system: Secondary | ICD-10-CM | POA: Diagnosis not present

## 2016-09-04 DIAGNOSIS — K5669 Other intestinal obstruction: Secondary | ICD-10-CM

## 2016-09-04 DIAGNOSIS — Z932 Ileostomy status: Secondary | ICD-10-CM | POA: Diagnosis not present

## 2016-09-04 DIAGNOSIS — E871 Hypo-osmolality and hyponatremia: Secondary | ICD-10-CM | POA: Diagnosis not present

## 2016-09-04 DIAGNOSIS — R1031 Right lower quadrant pain: Secondary | ICD-10-CM | POA: Diagnosis not present

## 2016-09-04 DIAGNOSIS — Z8249 Family history of ischemic heart disease and other diseases of the circulatory system: Secondary | ICD-10-CM | POA: Diagnosis not present

## 2016-09-04 DIAGNOSIS — Z803 Family history of malignant neoplasm of breast: Secondary | ICD-10-CM

## 2016-09-04 DIAGNOSIS — Z23 Encounter for immunization: Secondary | ICD-10-CM | POA: Diagnosis not present

## 2016-09-04 DIAGNOSIS — Z818 Family history of other mental and behavioral disorders: Secondary | ICD-10-CM

## 2016-09-04 DIAGNOSIS — K56609 Unspecified intestinal obstruction, unspecified as to partial versus complete obstruction: Secondary | ICD-10-CM | POA: Diagnosis present

## 2016-09-04 DIAGNOSIS — R110 Nausea: Secondary | ICD-10-CM | POA: Diagnosis not present

## 2016-09-04 DIAGNOSIS — N179 Acute kidney failure, unspecified: Secondary | ICD-10-CM | POA: Diagnosis present

## 2016-09-04 DIAGNOSIS — K604 Rectal fistula: Secondary | ICD-10-CM | POA: Diagnosis present

## 2016-09-04 DIAGNOSIS — Z6821 Body mass index (BMI) 21.0-21.9, adult: Secondary | ICD-10-CM | POA: Diagnosis not present

## 2016-09-04 DIAGNOSIS — R1900 Intra-abdominal and pelvic swelling, mass and lump, unspecified site: Secondary | ICD-10-CM | POA: Diagnosis present

## 2016-09-04 DIAGNOSIS — D649 Anemia, unspecified: Secondary | ICD-10-CM | POA: Diagnosis present

## 2016-09-04 DIAGNOSIS — K50819 Crohn's disease of both small and large intestine with unspecified complications: Secondary | ICD-10-CM | POA: Diagnosis present

## 2016-09-04 DIAGNOSIS — R933 Abnormal findings on diagnostic imaging of other parts of digestive tract: Secondary | ICD-10-CM | POA: Diagnosis not present

## 2016-09-04 LAB — MAGNESIUM: Magnesium: 1.9 mg/dL (ref 1.7–2.4)

## 2016-09-04 LAB — COMPREHENSIVE METABOLIC PANEL
ALK PHOS: 47 U/L (ref 38–126)
ALT: 12 U/L — AB (ref 17–63)
AST: 19 U/L (ref 15–41)
Albumin: 3 g/dL — ABNORMAL LOW (ref 3.5–5.0)
Anion gap: 8 (ref 5–15)
BILIRUBIN TOTAL: 1.3 mg/dL — AB (ref 0.3–1.2)
BUN: 9 mg/dL (ref 6–20)
CALCIUM: 8.8 mg/dL — AB (ref 8.9–10.3)
CHLORIDE: 100 mmol/L — AB (ref 101–111)
CO2: 26 mmol/L (ref 22–32)
CREATININE: 1.33 mg/dL — AB (ref 0.61–1.24)
GFR, EST NON AFRICAN AMERICAN: 57 mL/min — AB (ref >60)
Glucose, Bld: 97 mg/dL (ref 65–99)
Potassium: 3.9 mmol/L (ref 3.5–5.1)
Sodium: 134 mmol/L — ABNORMAL LOW (ref 135–145)
TOTAL PROTEIN: 7 g/dL (ref 6.5–8.1)

## 2016-09-04 LAB — CBC
HCT: 37.5 % — ABNORMAL LOW (ref 39.0–52.0)
Hemoglobin: 12.4 g/dL — ABNORMAL LOW (ref 13.0–17.0)
MCH: 31.3 pg (ref 26.0–34.0)
MCHC: 33.1 g/dL (ref 30.0–36.0)
MCV: 94.7 fL (ref 78.0–100.0)
PLATELETS: 334 10*3/uL (ref 150–400)
RBC: 3.96 MIL/uL — AB (ref 4.22–5.81)
RDW: 13 % (ref 11.5–15.5)
WBC: 10.5 10*3/uL (ref 4.0–10.5)

## 2016-09-04 LAB — LIPASE, BLOOD: LIPASE: 17 U/L (ref 11–51)

## 2016-09-04 LAB — PHOSPHORUS: Phosphorus: 4.6 mg/dL (ref 2.5–4.6)

## 2016-09-04 MED ORDER — ENOXAPARIN SODIUM 40 MG/0.4ML ~~LOC~~ SOLN
40.0000 mg | SUBCUTANEOUS | Status: DC
  Administered 2016-09-04 – 2016-09-05 (×2): 40 mg via SUBCUTANEOUS
  Filled 2016-09-04 (×2): qty 0.4

## 2016-09-04 MED ORDER — MIRTAZAPINE 15 MG PO TABS
15.0000 mg | ORAL_TABLET | Freq: Every day | ORAL | Status: DC
  Administered 2016-09-04 – 2016-09-08 (×5): 15 mg via ORAL
  Filled 2016-09-04 (×5): qty 1

## 2016-09-04 MED ORDER — MORPHINE SULFATE (PF) 4 MG/ML IV SOLN
4.0000 mg | Freq: Once | INTRAVENOUS | Status: AC
  Administered 2016-09-04: 4 mg via INTRAVENOUS
  Filled 2016-09-04: qty 1

## 2016-09-04 MED ORDER — IOPAMIDOL (ISOVUE-300) INJECTION 61%
INTRAVENOUS | Status: AC
  Filled 2016-09-04: qty 100

## 2016-09-04 MED ORDER — ONDANSETRON HCL 4 MG/2ML IJ SOLN
4.0000 mg | Freq: Once | INTRAMUSCULAR | Status: AC
  Administered 2016-09-04: 4 mg via INTRAVENOUS
  Filled 2016-09-04: qty 2

## 2016-09-04 MED ORDER — ACETAMINOPHEN 325 MG PO TABS: 650.0000 mg | ORAL_TABLET | Freq: Four times a day (QID) | ORAL | Status: DC | PRN

## 2016-09-04 MED ORDER — ACETAMINOPHEN 650 MG RE SUPP: 650.0000 mg | Freq: Four times a day (QID) | RECTAL | Status: DC | PRN

## 2016-09-04 MED ORDER — KCL IN DEXTROSE-NACL 20-5-0.9 MEQ/L-%-% IV SOLN
INTRAVENOUS | Status: DC
  Administered 2016-09-04 – 2016-09-05 (×2): 1000 mL via INTRAVENOUS
  Administered 2016-09-05 – 2016-09-06 (×3): via INTRAVENOUS
  Filled 2016-09-04 (×9): qty 1000

## 2016-09-04 MED ORDER — ONDANSETRON HCL 4 MG/2ML IJ SOLN: 4.0000 mg | Freq: Four times a day (QID) | INTRAMUSCULAR | Status: DC | PRN

## 2016-09-04 MED ORDER — PROMETHAZINE HCL 25 MG/ML IJ SOLN: 25.0000 mg | Freq: Four times a day (QID) | INTRAMUSCULAR | Status: DC | PRN

## 2016-09-04 MED ORDER — BOOST / RESOURCE BREEZE PO LIQD
1.0000 | Freq: Three times a day (TID) | ORAL | Status: DC
  Administered 2016-09-04 – 2016-09-09 (×13): 1 via ORAL
  Filled 2016-09-04 (×2): qty 1

## 2016-09-04 MED ORDER — METHYLPREDNISOLONE SODIUM SUCC 40 MG IJ SOLR
40.0000 mg | Freq: Three times a day (TID) | INTRAMUSCULAR | Status: DC
  Administered 2016-09-04 – 2016-09-06 (×5): 40 mg via INTRAVENOUS
  Filled 2016-09-04 (×5): qty 1

## 2016-09-04 MED ORDER — IOPAMIDOL (ISOVUE-300) INJECTION 61%
100.0000 mL | Freq: Once | INTRAVENOUS | Status: AC | PRN
  Administered 2016-09-04: 100 mL via INTRAVENOUS

## 2016-09-04 MED ORDER — MORPHINE SULFATE (PF) 2 MG/ML IV SOLN
1.0000 mg | INTRAVENOUS | Status: DC | PRN
  Administered 2016-09-04 (×2): 4 mg via INTRAVENOUS
  Filled 2016-09-04 (×2): qty 2

## 2016-09-04 NOTE — H&P (Signed)
History and Physical    Daniel Howard NID:[id number] DOB: 1956-10-13 DOA: 09/04/2016   PCP: Vic Blackbird, MD /UNASSIGNED  Patient coming from/Resides with: Private residence/lives with wife  Admission status: Inpatient/medically necessary to stay a minimum 2 midnights to rule out impending and/or unexpected changes in physiologic status that may differ from initial evaluation performed in the ER and/or at time of admission. Patient presents with decreased ileostomy output concerning for small bowel obstruction as well as signs and symptoms of dehydration and significant Crohn's exacerbation. There is also question of perirectal mass and possible perirectal fistula. He is currently requiring nothing by mouth status, IV fluids and IV medicines for symptom management and likely will require IV steroids and IV antibiotics pending gastroenterology and surgical evaluation.  Chief Complaint: Abdominal pain with obstructive symptoms and decreased ileostomy output  HPI: Daniel Howard is a 60 y.o. male with medical history significant for severe ileocolonic Crohn's disease diagnosed at age 83. Prior ileocecostomy over 30 years ago with resultant ileostomy. Multiple ventral hernias. History of prior rectal stricture at the 3 cm level. Followed by Dr. Vladimir Crofts at [facility] as well as Dr. Gala Romney in [place]. He has recurrent admissions for bowel obstructive symptoms. He has long-standing protein calorie malnutrition and has difficulty gaining weight. He has been relatively stable on combination of Asacol PO and Humira. Patient reports a 2 day history of increasing abdominal pain and cramping with decreased ileostomy output. Typical the patient reports he can go on a liquid diet and achieve resolution of the symptoms and get back to baseline. Unfortunately his pain is increased and he has noticed decreased ileostomy output and he feels dehydrated. He endorses poor intake  for several days. At baseline he has occasional mucoid stools per rectum and he has not noticed any malodorous rectal drainage or any rectal pain. Because of nausea he was unable to take his usual medications. He reports typical ileostomy output is usually high volume.  ED Course:  Vital Signs: BP 113/82   Pulse 88   Temp 97.8 F (36.6 C) (Oral)   Resp 26   Ht 5' 4.5" (1.638 m)   Wt 59 kg (130 lb)   SpO2 100%   BMI 21.97 kg/m  CT abdomen and pelvis with contrast: Several hernias through the transverse abdominis muscle that radiologist else is contributing to patient's small bowel obstruction symptoms, abnormal mucosal enhancement and multiple segments of small bowel with the largest dilated segment of bowel of 10 cm proximal to the ostomy in the right abdomen measuring 6.3 cm. There is also the possibility of an associated stricture, there is also an apparent mass on the right side of the rectum measuring 4.3 x 3.1 cm with left perirectal inflammatory findings concerning for possible left perianal fistula. Lab data: Sodium 134, potassium 3.9, chloride 100, CO2 26, BUN 9, creatinine 1.23, albumin 3.0, total bilirubin 1.3, CRP on 9/8 was elevated at 6.7, WBC 10,500 differential not obtained, hemoglobin 12.4, platelets 334,000 Medications and treatments: Morphine 4 mg IV 2 doses, Zofran 4 mg IV 1 dose  Review of Systems:  In addition to the HPI above,  No Fever-chills, myalgias or other constitutional symptoms No Headache, changes with Vision or hearing, new weakness, tingling, numbness in any extremity, No problems swallowing food or Liquids, indigestion/reflux No Chest pain, Cough or Shortness of Breath, palpitations, orthopnea or DOE No melena or hematochezia through the ileostomy, no dark tarry stools for the ileostomy No dysuria, hematuria  or flank pain No new skin rashes, lesions, masses or bruises, No new joints pains-aches No recent weight gain No polyuria, polydypsia or  polyphagia,   Past Medical History:  Diagnosis Date  . Abnormal finding of biliary tract    MRCP shows pancreatic/biliary tract dilation. EUS 2010 confirmed dilation but no chronic pancreaitis or mass. Vascular ectasia crimpoing distal CBD.   [name] Anxiety   . 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 Duodenal ulcer 2010   nsaids  . Low back pain   . Peristomal hernia   . Small bowel obstruction ([place])   . Spigelian hernia    bilateral    Past Surgical History:  Procedure Laterality Date  . APPENDECTOMY  1992   at time of exp laparotomy at which time he was noted to have fistulizing Crohn's rather than UC  . COLONOSCOPY [name] 08/23/2014   EUM:PNTIRWE proctoscopy with possible fistulous opening in thebase of rectal/anal stump.    . COLOSTOMY  1992   transverse loop colostomy secondary to a stricture  . ESOPHAGOGASTRODUODENOSCOPY  05/2009   SLF: multiple antral erosions, large ulcer at ansatomosis (postsurgical changes at duodenal bulb and second portion of duodenum) BX c/x NSAIDS.  . EUS  10/04/2009   Dr. Estill Bakes dilated CBD and main pancreatic duct.  No pancreatic  . FLEXIBLE SIGMOIDOSCOPY  1988   Dr. Laural Golden- suggested rohn's disease but the biopsies were not collaborative.  [name] HEMICOLOECTOMY W/ ANASTOMOSIS  1993   R- Dr.DeMason   . HERNIA REPAIR  1996   incarcerated periostial hernia with additional surgery in 1999    Social History   Social History  . Marital status: Married    Spouse name: [name]  . Number of children: 65  . Years of education: [name]   Occupational History  . disabled Unemployed   Social History Main Topics  . Smoking  status: Former Smoker    Packs/day: 0.50    Years: 2.00    Types: Cigarettes    Quit date: 12/21/2009  . Smokeless tobacco: Never Used     Comment: Quit abut 20 years  . Alcohol use No     Comment: Former drinker  . Drug use: No  . Sexual activity: Yes    Partners: Female   Other Topics Concern  . Not on file   Social History Narrative  . No narrative on file    Mobility: Without assistive devices Work history: Disabled   Allergies  Allergen Reactions  . Penicillins Hives    Family History  Problem Relation Age of Onset  . Cancer Father     prostate   . Prostate cancer Father   . Hypertension Sister   . Cancer Sister   . Depression Sister   . Breast cancer Sister   . COPD Sister   . Aneurysm Brother     deceased, brain aneurysm     Prior to Admission medications   Medication Sig Start Date End Date Taking? Authorizing Provider  Adalimumab (HUMIRA PEN) 40 MG/0.8ML PNKT Inject 40 mg into the skin every 14 (fourteen) days. 05/23/16  Yes Mahala Menghini, PA-C  calcium carbonate (OS-CAL) 600 MG TABS tablet Take 1,200 mg by mouth daily  with breakfast.   Yes Historical Provider, MD  Ferrous Sulfate 28 MG TABS Take 1 tablet by mouth daily.   Yes Historical Provider, MD  HYDROcodone-acetaminophen (NORCO/VICODIN) 5-325 MG tablet Take 1 tablet by mouth every 6 (six) hours as needed for moderate pain. 07/01/16  Yes Alycia Rossetti, MD  mirtazapine (REMERON) 15 MG tablet Take 1 tablet (15 mg total) by mouth at bedtime. 07/29/16  Yes Alycia Rossetti, MD  Multiple Vitamins-Minerals (CENTRUM SILVER ADULT 50+) TABS Take 1 tablet by mouth daily.   Yes Historical Provider, MD    Physical Exam: Vitals:   09/04/16 1130 09/04/16 1145 09/04/16 1200 09/04/16 1215  BP: 106/72 116/68 120/78 113/82  Pulse: 64 64 90 88  Resp: 14 12 18 26   Temp:      TempSrc:      SpO2: 99% 100% 100% 100%  Weight:      Height:          Constitutional: NAD, calm, uncomfortable and noted with  colicky episodic abdominal pain-appears underweight Eyes: PERRL, lids and conjunctivae normal ENMT: Mucous membranes are dry. Posterior pharynx clear of any exudate or lesions.Normal dentition.  Neck: normal, supple, no masses, no thyromegaly Respiratory: clear to auscultation bilaterally, no wheezing, no crackles. Normal respiratory effort. No accessory muscle use.  Cardiovascular: Regular rate and rhythm, no murmurs / rubs / gallops. No extremity edema. 2+ pedal pulses. No carotid bruits.  Abdomen: Generalized abdominal tenderness without guarding or rebounding, significantly scarred abdomen from prior surgical procedures, ileostomy right lower quadrant with pink somewhat rosy protuberant stoma with a combination of liquid and gray colored loose stool noted on the stoma, no peristomal herniation appreciated, no masses palpated. No hepatosplenomegaly. Hyperactive Bowel sounds auscultated directly above the stoma.  Musculoskeletal: no clubbing / cyanosis. No joint deformity upper and lower extremities. Good ROM, no contractures. Normal muscle tone.  Skin: no rashes, lesions, ulcers. No induration Neurologic: CN 2-12 grossly intact. Sensation intact, DTR normal. Strength 5/5 x all 4 extremities.  Psychiatric: Normal judgment and insight. Alert and oriented x 3. Normal mood.    Labs on Admission: I have personally reviewed following labs and imaging studies  CBC:  Recent Labs Lab 08/29/16 1016 08/30/16 2003 09/04/16 0953  WBC 6.5 6.6 10.5  NEUTROABS 3,510  --   --   HGB 14.1 12.6* 12.4*  HCT 42.8 38.1* 37.5*  MCV 96.2 94.5 94.7  PLT 389 367 532   Basic Metabolic Panel:  Recent Labs Lab 08/29/16 1016 08/30/16 2003 09/04/16 0953  NA 136 135 134*  K 4.9 4.5 3.9  CL 98 99* 100*  CO2 26 28 26   GLUCOSE 102* 90 97  BUN 16 11 9   CREATININE 1.38* 1.40* 1.33*  CALCIUM 10.0 9.4 8.8*   GFR: Estimated Creatinine Clearance: 50.5 mL/min (by C-G formula based on SCr of 1.33 mg/dL  (H)). Liver Function Tests:  Recent Labs Lab 08/29/16 1016 08/30/16 2003 09/04/16 0953  AST 21 20 19   ALT 14 15* 12*  ALKPHOS 50 48 47  BILITOT 1.0 1.1 1.3*  PROT 8.2* 7.7 7.0  ALBUMIN 3.8 3.4* 3.0*    Recent Labs Lab 08/30/16 2003 09/04/16 0953  LIPASE 22 17   No results for input(s): AMMONIA in the last 168 hours. Coagulation Profile: No results for input(s): INR, PROTIME in the last 168 hours. Cardiac Enzymes: No results for input(s): CKTOTAL, CKMB, CKMBINDEX, TROPONINI in the last 168 hours. BNP (last 3 results) No results for input(s): PROBNP in the last  8760 hours. HbA1C: No results for input(s): HGBA1C in the last 72 hours. CBG: No results for input(s): GLUCAP in the last 168 hours. Lipid Profile: No results for input(s): CHOL, HDL, LDLCALC, TRIG, CHOLHDL, LDLDIRECT in the last 72 hours. Thyroid Function Tests: No results for input(s): TSH, T4TOTAL, FREET4, T3FREE, THYROIDAB in the last 72 hours. Anemia Panel: No results for input(s): VITAMINB12, FOLATE, FERRITIN, TIBC, IRON, RETICCTPCT in the last 72 hours. Urine analysis:    Component Value Date/Time   COLORURINE AMBER (A) 08/30/2016 1951   APPEARANCEUR CLEAR 08/30/2016 1951   LABSPEC 1.031 (H) 08/30/2016 1951   PHURINE 5.5 08/30/2016 1951   GLUCOSEU NEGATIVE 08/30/2016 1951   HGBUR TRACE (A) 08/30/2016 1951   BILIRUBINUR SMALL (A) 08/30/2016 1951   KETONESUR 15 (A) 08/30/2016 1951   PROTEINUR NEGATIVE 08/30/2016 1951   UROBILINOGEN 0.2 10/10/2014 1037   NITRITE NEGATIVE 08/30/2016 1951   LEUKOCYTESUR NEGATIVE 08/30/2016 1951   Sepsis Labs: @LABRCNTIP (procalcitonin:4,lacticidven:4) )No results found for this or any previous visit (from the past 240 hour(s)).   Radiological Exams on Admission: Ct Abdomen Pelvis W Contrast  Result Date: 09/04/2016 CLINICAL DATA:  Abdominal pain underlying ileostomy for 2 days. No ileostomy output. Nausea. Crohn' s disease. EXAM: CT ABDOMEN AND PELVIS WITH CONTRAST  TECHNIQUE: Multidetector CT imaging of the abdomen and pelvis was performed using the standard protocol following bolus administration of intravenous contrast. CONTRAST:  149m ISOVUE-300 IOPAMIDOL (ISOVUE-300) INJECTION 61% COMPARISON:  Multiple exams, including 10/10/2014 and MRI from 10/23/2014 FINDINGS: Lower chest: Mild scarring in the posterior basal segment left lower lobe. Mild dependent subsegmental atelectasis in the lower lobes. Hepatobiliary: Indistinct extrahepatic biliary tree. Otherwise unremarkable. Pancreas: Dilated dorsal pancreatic duct measuring up to 5 mm in diameter, but stable from 2015. Spleen: Stable small left inferior splenic hemangioma, 1.2 cm in long axis on image 27/2. Adrenals/Urinary Tract: 4 mm hypodense lesion anteriorly in the left mid kidney, likely a cyst but technically nonspecific. Adrenal glands unremarkable. Stomach/Bowel: Most of the stomach body is collapsed. Poor definition of the descending duodenum. Accentuated mucosal enhancement in a 10 cm loop of small bowel extending to the ostomy. Just proximal to this, a focally dilated loop of small bowel measures 6.3 cm in diameter on image 50/2, and there other loops of dilated small bowel measuring up to about 4 cm. There is segments of small bowel abnormal mucosal enhancement in scattered air-fluid levels. Moreover, on image 422series 2 there is a sSolicitorhernia believed to be involving small bowel and extending through the transverse abdominis muscle but not the oblique musculature. However, I am doubtful that this small Richter hernia is a site of obstruction. 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 There is a new masslike density along the right lateral rectal margin  and right posterior margin of the prostate gland, measuring 3.1 by 4.3 cm on image 73/2 and extending in the perirectal space nearly to the operator internus muscle. There is increased stranding in the perirectal space and probably new inflammatory or neoplastic findings in the left lower perirectal space and along the external sphincter on the left. Appearance on images 81-88 of series 2 raises the possibility of a perianal fistula extending to the intergluteal fold. Vascular/Lymphatic: Mesenteric nodes in the  right abdomen measure up to 1.2 cm in short axis, some with prior. Reproductive: Mildly asymmetry of the right seminal vesicle. Possibly abnormal enhancement in the peripheral zone of the right prostate gland for example on image 74/2. Other: Diffuse mesenteric edema. Trace ascites along the right paracolic gutter. Paucity of intra-abdominal adipose tissue. Musculoskeletal: Fused sacroiliac joints. Markedly dilated inferior gluteal vein varicosities, image 80/5 on the right and image 84/5 on the left - this can be a cause for sciatica due to impingement on the sciatic nerves. IMPRESSION: 1. 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 There is abnormal mucosal enhancement in multiple segments of small bowel more characteristic of Crohn's disease, with intermittent dilated in nondilated segments. The largest dilated segment of small bowel is in the right abdomen about 10 cm proximal to the ostomy, and measures 6.3 cm in diameter. There is a somewhat abrupt angulation between the dilated segment of right-sided abdominal small bowel and the nondilated segment leading to the ostomy which has accentuated mucosal enhancement. The possibility of a stricture around image 53 of series 2 is not  totally excluded. 2. There is an apparent mass along the right side of the rectum measuring 4.3 by 3.1 cm, extending through the external sphincter and abutting the operator internus. This mass also extends along the right posterolateral margin of the prostate gland where there some abnormal enhancement in the peripheral zone. This could be arising from the rectum or prostate. I am suspicious for cancer. 3. Left perirectal inflammatory findings with a possible left perianal fistula extending to the left side of the intergluteal fold. 4. Prominently dilated inferior gluteal veins, this can cause sciatica. 5. 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 6. Stably dilated dorsal pancreatic duct at 5 mm in diameter, not changed from 2015. 7. Diffuse mesenteric edema with paucity of intra-abdominal adipose tissue. 8. Few sacroiliac joints. Electronically Signed   By: Van Clines M.D.   On: 09/04/2016 11:31     Assessment/Plan Principal Problem:   Crohn's disease of both small and large intestine with complication/ SBO (small bowel obstruction)  -Patient presents with symptoms typical for small bowel obstruction including abdominal pain, dehydration and abnormal findings on CT scan that include possibility of Crohn's exacerbation as well as SBO -Await surgery and GI recommendations -NPO and bowel rest -Antibiotics/steroids at discretion of GI team  Active Problems:   L Rectal mass/R Perirectal fistula -Await GI and general surgery recommendations -documented h/o prior rectal stricture per GI notes from [facility] -Likely will need to undergo flexible sigmoidoscopy to better clarify -No leukocytosis and defer initiation of antibiotics/steroids to GI and/or surgery team    Dehydration with hyponatremia -NPO -D5 NS w/ 20 KCL at 125 mL an hour -Follow labs -Symptom management w/ IV Zofran and Phenergan    Anemia -Multifactorial secondary to chronic disease and malnutrition with an iron deficiency  component -Hold preadmission iron -Follow CBC    Protein-calorie malnutrition, severe  -Nutrition consultation -weight trends: 155 in 2016 and down to 130 today      DVT prophylaxis: Lovenox  Code Status: Full  Family Communication: No family or friends at bedside at time of initial evaluation Disposition Plan: Anticipate discharge back to preadmission home environment once medically stable Consults called: Gastroenterology/[place]; General surgery/[place]  ELLIS,ALLISON L. ANP-BC [facility] Pager [phone]   If 7PM-7AM, please contact night-coverage [email or web address] Password [email or web address]  09/04/2016, 2:11 PM

## 2016-09-04 NOTE — Consult Note (Signed)
Reason for Consult:  SBO/Crohn's disease, non obstruction hernias Referring Physician:  Dr. Ellin Mayhew (ED) PCP:  Vic Blackbird, MD GI:  Pinchus Weckwerth is an 60 y.o. male.  HPI: Patient presented to the ED on 08/30/16 with diarrhea and cramps. He noted onset of diarrhea approximately 3 days prior to this evaluation he took Imodium and his symptoms improved. He had one episode of nonbloody nonbilious emesis 2 days prior to this evaluation. On 99 when he was evaluated his symptoms were better but he came for evaluation to ease his mother's concerns. Workup at that time showed some elevation in his creatinine, he was asymptomatic and given a liter of IV fluids and discharged home for follow-up with his gastroenterologist Dr. Gala Romney in [place]. Patient noted that time that he was compliant with his Humira.  Today he returns via EMS with 3 days of abdominal pain states his ileostomy has not drained for 2 days states he has decrease in appetite and has been drinking fluids. He was somewhat orthostatic on standing. His vital signs the time of transport were stable. On evaluation today 09/04/16, he notes he has had no output from his ostomy for about 48 hours. He was having some nausea but no vomiting no fever or chills he has pain in the ostomy site which is worse over the last couple days.  Workup in the emergency department shows he is afebrile vital signs are stable he's somewhat tachycardic intermittently since admission. Labs today shows sodium is down to 134 creatinine is elevated but stable. On 08/29/16 he was 1.38. Repeat creatinine on 08/30/16 was 1.40. This a.m. his creatinine is 1.33 bilirubin is elevated this a.m. Albumin is down to 3.0, from 3.8 on 08/29/16. CBC is 10.5 hemoglobin is 12.4 hematocrit is 37.5 this is down from 14.8/42.8 on 08/29/16. UA is unremarkable.  CT scan: As malignancy. a Richter's hernia through the transverse abdominis muscles and a large left Spigelean   hernia  containing multiple loops of bowel. You can see contrast going in and out of this area. There is abnormal mucosal enhancement in multiple sections in the small bowel more characteristic of Crohn's disease with intermittent dilated and nondilated segments. The largest segment of small bowel is the right abdomen about 10 cm proximal to the ostomy and measures 6.3 cm in diameter. There is an abrupt angulation between the dilated segments of the right sided abdominal small bowel and the nondilated segment leading to the ostomy which has accentuated mucosal enhancement. There was a question of stricture. There is also a mass along the right side of the rectum measuring 4.3 x 3.1 extending through the external sphincter and abutting the margin of the prostate with a concern for malignancy. There is a left perirectal inflammatory findings with a concern for a left perianal fistula extending to the left intragluteal fold.  We are asked to see.  Past Medical History:  Diagnosis Date  . Abnormal finding of biliary tract    MRCP shows pancreatic/biliary tract dilation. EUS 2010 confirmed dilation but no chronic pancreaitis or mass. Vascular ectasia crimpoing distal CBD.   [name] Anxiety   . 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  Duodenal ulcer 2010   nsaids  . Low back pain   . Peristomal hernia   . Small bowel obstruction ([place])   . Spigelian hernia    bilateral    Past Surgical History:  Procedure Laterality Date  . APPENDECTOMY  1992   at time of exp laparotomy at which time he was noted to have fistulizing Crohn's rather than UC  . COLONOSCOPY [name] 08/23/2014    [id number] proctoscopy with possible fistulous opening in thebase of rectal/anal stump.    . COLOSTOMY  1992   transverse loop colostomy secondary to a stricture  . ESOPHAGOGASTRODUODENOSCOPY  05/2009   SLF: multiple antral erosions, large ulcer at ansatomosis (postsurgical changes at duodenal bulb and second portion of duodenum) BX c/x NSAIDS.  . EUS  10/04/2009   Dr. Estill Bakes dilated CBD and main pancreatic duct.  No pancreatic  . FLEXIBLE SIGMOIDOSCOPY  1988   Dr. Laural Golden- suggested rohn's disease but the biopsies were not collaborative.  [name] HEMICOLOECTOMY W/ ANASTOMOSIS  1993   R- Dr.DeMason   . HERNIA REPAIR  1996   incarcerated periostial hernia with additional surgery in 1999    Family History  Problem Relation Age of Onset  . Cancer Father     prostate   . Prostate cancer Father   . Hypertension Sister   . Cancer Sister   . Depression Sister   . Breast cancer Sister   . COPD Sister   . Aneurysm Brother     deceased, brain aneurysm    Social History:  reports that he quit smoking about 6 years ago. His smoking use included Cigarettes. He has a 1.00 pack-year smoking history. He has never used smokeless tobacco. He reports that he does not drink alcohol or use drugs.   Tobacco:  About 20 years, quit 15 years ago ETOH:  Positive history.  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    Allergies:  Allergies  Allergen Reactions  . Penicillins Hives    Prior to Admission medications   Medication Sig Start Date End Date Taking? Authorizing Provider  Adalimumab (HUMIRA PEN) 40 MG/0.8ML PNKT Inject 40 mg into the skin every 14 (fourteen) days. 05/23/16 Last dose 09/01/16.  Yes Mahala Menghini, PA-C  calcium carbonate (OS-CAL) 600 MG TABS tablet Take 1,200 mg by mouth daily with breakfast.   Yes Historical Provider, MD  Ferrous Sulfate 28 MG TABS Take 1 tablet by mouth daily.   Yes  Historical Provider, MD  HYDROcodone-acetaminophen (NORCO/VICODIN) 5-325 MG tablet Take 1 tablet by mouth every 6 (six) hours as needed for moderate pain. 07/01/16  Yes Alycia Rossetti, MD  mirtazapine (REMERON) 15 MG tablet Take 1 tablet (15 mg total) by mouth at bedtime. 07/29/16  Yes Alycia Rossetti, MD  Multiple Vitamins-Minerals (CENTRUM SILVER ADULT 50+) TABS Take 1 tablet by mouth daily.   Yes Historical Provider, MD     Results for orders placed or performed during the hospital encounter of 09/04/16 (from the past 48 hour(s))  Lipase, blood     Status: None   Collection Time: 09/04/16  9:53 AM  Result Value Ref Range   Lipase 17 11 - 51 U/L  Comprehensive metabolic panel     Status: Abnormal   Collection Time: 09/04/16  9:53 AM  Result Value Ref Range   Sodium 134 (L) 135 - 145 mmol/L  Potassium 3.9 3.5 - 5.1 mmol/L   Chloride 100 (L) 101 - 111 mmol/L   CO2 26 22 - 32 mmol/L   Glucose, Bld 97 65 - 99 mg/dL   BUN 9 6 - 20 mg/dL   Creatinine, Ser 1.33 (H) 0.61 - 1.24 mg/dL   Calcium 8.8 (L) 8.9 - 10.3 mg/dL   Total Protein 7.0 6.5 - 8.1 g/dL   Albumin 3.0 (L) 3.5 - 5.0 g/dL   AST 19 15 - 41 U/L   ALT 12 (L) 17 - 63 U/L   Alkaline Phosphatase 47 38 - 126 U/L   Total Bilirubin 1.3 (H) 0.3 - 1.2 mg/dL   GFR calc non Af Amer 57 (L) >60 mL/min   GFR calc Af Amer >60 >60 mL/min    Comment: (NOTE) The eGFR has been calculated using the CKD EPI equation. This calculation has not been validated in all clinical situations. eGFR's persistently <60 mL/min signify possible Chronic Kidney Disease.    Anion gap 8 5 - 15  CBC     Status: Abnormal   Collection Time: 09/04/16  9:53 AM  Result Value Ref Range   WBC 10.5 4.0 - 10.5 K/uL   RBC 3.96 (L) 4.22 - 5.81 MIL/uL   Hemoglobin 12.4 (L) 13.0 - 17.0 g/dL   HCT 37.5 (L) 39.0 - 52.0 %   MCV 94.7 78.0 - 100.0 fL   MCH 31.3 26.0 - 34.0 pg   MCHC 33.1 30.0 - 36.0 g/dL   RDW 13.0 11.5 - 15.5 %   Platelets 334 150 - 400 K/uL    Ct  Abdomen Pelvis W Contrast  Result Date: 09/04/2016 CLINICAL DATA:  Abdominal pain underlying ileostomy for 2 days. No ileostomy output. Nausea. Crohn' s disease. EXAM: CT ABDOMEN AND PELVIS WITH CONTRAST TECHNIQUE: Multidetector CT imaging of the abdomen and pelvis was performed using the standard protocol following bolus administration of intravenous contrast. CONTRAST:  161m ISOVUE-300 IOPAMIDOL (ISOVUE-300) INJECTION 61% COMPARISON:  Multiple exams, including 10/10/2014 and MRI from 10/23/2014 FINDINGS: Lower chest: Mild scarring in the posterior basal segment left lower lobe. Mild dependent subsegmental atelectasis in the lower lobes. Hepatobiliary: Indistinct extrahepatic biliary tree. Otherwise unremarkable. Pancreas: Dilated dorsal pancreatic duct measuring up to 5 mm in diameter, but stable from 2015. Spleen: Stable small left inferior splenic hemangioma, 1.2 cm in long axis on image 27/2. Adrenals/Urinary Tract: 4 mm hypodense lesion anteriorly in the left mid kidney, likely a cyst but technically nonspecific. Adrenal glands unremarkable. Stomach/Bowel: Most of the stomach body is collapsed. Poor definition of the descending duodenum. Accentuated mucosal enhancement in a 10 cm loop of small bowel extending to the ostomy. Just proximal to this, a focally dilated loop of small bowel measures 6.3 cm in diameter on image 50/2, and there other loops of dilated small bowel measuring up to about 4 cm. There is segments of small bowel abnormal mucosal enhancement in scattered air-fluid levels. Moreover, on image 44series 2 there is a sSolicitorhernia believed to be involving small bowel and extending through the transverse abdominis muscle but not the oblique musculature. However, I am doubtful that this small Richter hernia is a site of obstruction. Left Spigelian hernia containing multiple loops of small bowel including what seems to be a borderline dilated fluid-filled loop along the inferior margin of  the herniation sac, similar to prior I do not see a definite the efferent dilated loop to suggest that this is the point of obstruction. There is a  new masslike density along the right lateral rectal margin and right posterior margin of the prostate gland, measuring 3.1 by 4.3 cm on image 73/2 and extending in the perirectal space nearly to the operator internus muscle. There is increased stranding in the perirectal space and probably new inflammatory or neoplastic findings in the left lower perirectal space and along the external sphincter on the left. Appearance on images 81-88 of series 2 raises the possibility of a perianal fistula extending to the intergluteal fold. Vascular/Lymphatic: Mesenteric nodes in the right abdomen measure up to 1.2 cm in short axis, some with prior. Reproductive: Mildly asymmetry of the right seminal vesicle. Possibly abnormal enhancement in the peripheral zone of the right prostate gland for example on image 74/2. Other: Diffuse mesenteric edema. Trace ascites along the right paracolic gutter. Paucity of intra-abdominal adipose tissue. Musculoskeletal: Fused sacroiliac joints. Markedly dilated inferior gluteal vein varicosities, image 80/5 on the right and image 84/5 on the left - this can be a cause for sciatica due to impingement on the sciatic nerves. IMPRESSION: 1. 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 There is abnormal mucosal enhancement in multiple segments of small bowel more characteristic of Crohn's disease, with intermittent dilated in nondilated segments. The largest dilated segment of small bowel is in the right abdomen about 10 cm proximal to the ostomy, and measures 6.3 cm in diameter. There is a somewhat abrupt angulation between the  dilated segment of right-sided abdominal small bowel and the nondilated segment leading to the ostomy which has accentuated mucosal enhancement. The possibility of a stricture around image 53 of series 2 is not totally excluded. 2. There is an apparent mass along the right side of the rectum measuring 4.3 by 3.1 cm, extending through the external sphincter and abutting the operator internus. This mass also extends along the right posterolateral margin of the prostate gland where there some abnormal enhancement in the peripheral zone. This could be arising from the rectum or prostate. I am suspicious for cancer. 3. Left perirectal inflammatory findings with a possible left perianal fistula extending to the left side of the intergluteal fold. 4. Prominently dilated inferior gluteal veins, this can cause sciatica. 5. 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 6. Stably dilated dorsal pancreatic duct at 5 mm in diameter, not changed from 2015. 7. Diffuse mesenteric edema with paucity of intra-abdominal adipose tissue. 8. Few sacroiliac joints. Electronically Signed   By: Van Clines M.D.   On: 09/04/2016 11:31    Review of Systems  Constitutional: Positive for chills. Negative for diaphoresis, fever and malaise/fatigue.  HENT: Negative.   Eyes: Negative.   Respiratory: Negative.   Gastrointestinal: Positive for abdominal pain (most of his pain is around his parastomal hernia on the right. ), diarrhea (about 9/6-9/9 resolved with imodium) and nausea. Negative for blood in stool, melena and vomiting. Constipation: no stool from the ostomy for 48 hours.  Genitourinary: Negative.   Musculoskeletal: Negative.   Skin: Negative.   Neurological: Negative.  Negative for weakness.  Endo/Heme/Allergies: Negative.   Psychiatric/Behavioral: Negative.    Blood pressure 113/82, pulse 88, temperature 97.8 F (36.6 C), temperature source Oral, resp. rate 26, height 5' 4.5" (1.638 m), weight 59 kg (130 lb), SpO2  100 %. Physical Exam  Constitutional: He is oriented  to person, place, and time. No distress.  Thin cachetic male in no acute distress.  HENT:  Head: Normocephalic and atraumatic.  Nose: Nose normal.  Eyes: Right eye exhibits no discharge. Left eye exhibits no discharge. No scleral icterus.  Neck: Normal range of motion. Neck supple. No JVD present. No tracheal deviation present. No thyromegaly present.  Old tracheostomy scar, he doesn't remember when he had that, it appears to have been with one of his Crohn's surgeries.  Cardiovascular: Regular rhythm, normal heart sounds and intact distal pulses.   No murmur heard. A little tachycardic   Respiratory: Effort normal and breath sounds normal. No respiratory distress. He has no wheezes. He has no rales. He exhibits no tenderness.  GI: He exhibits distension.    He is distended but not really tender.  He has almost no bowel sounds, he has a parastomal hernia and the working colostomy.  The ostomy is a bit distended above the skin.  He has gas and some clear fecal like fluid within the bag when I first got there.  Later he started putting out some putty colored pasty stool. He has a good deal of scaring and keloid formation anterior abdomen, mid line from below the Xyphoid and down to the pubis.  He has on old ostomy site on the left, that has now been revised to the right side. When he has pain it is around the ostomy, and he take vicodin for this 1-1.5 tablets at most.  Pain has been an issue for a few days, he went a very long time without any pain or use of pain med.  Genitourinary: Rectal exam shows guaiac positive stool (no stool on rectal exam, so I did not test it.).  Genitourinary Comments: Rectal exam.  He has a scar on the left buttocks near the rectum that is healed and no drainage or erythema.   He reports some mucus from his rectum, no blood, no purulent fluid, no tenderness or rectal pain. Rectal exam:  Tight with scar palpable  extending to the external scar you can see.  No stool or blood on my finger, the area is very tight and I didn't go more than about 2.5 cm before I stopped.  It was just to painful for him.   He has pain with palpation of the old fistula site and stricture, no pain with palpation of right buttocks.    Musculoskeletal: He exhibits no edema or tenderness.  Lymphadenopathy:    He has no cervical adenopathy.  Neurological: He is alert and oriented to person, place, and time. No cranial nerve deficit.  Skin: Skin is warm and dry. No rash noted. He is not diaphoretic. No erythema. No pallor.  Psychiatric: He has a normal mood and affect. His behavior is normal. Judgment and thought content normal.    Assessment/Plan: 1.  Small bowel obstruction with probable Crohn's flare- "Accentuated mucosal enhancement in a 10 cm loop of small bowel extending to the ostomy proximal to dilated small bowel loop." -on Humira, last dose 09/01/16 2.  Crohn's disease since age 48;stricturing disease that required a possible ileocecectomy 52 years ago leaving him with an ostomy and residual transverse, descending, and sigmoid colon.  Hx of stricture about 3cm into the rectum  Previously followed at [facility], [facility] 12/08/2014, transferred to Dr. Gala Romney for local care. 3. Non obstructing Richter and Spigelian hernias 4.  Mass along the right lateral rectal margin and right posterior prostate  gland 3.1 x 4.3 cm - family hx of colon cancer.  2.7 x 4.6 cm mass right buttocks of uncertain etiology.    Plan:  Being admitted by Medicine, and GI to see.  It is Dr. Lear Ng opinion this is mostly a Crohn's flare.  The rectal mass and right buttocks mass will need to be evaluated also.  Will follow with you and discuss with GI.  He has some pasty colored stool in the bag now coming from the ostomy.            Nalla Purdy 09/04/2016, 1:59 PM

## 2016-09-04 NOTE — ED Notes (Signed)
Tried calling report to nurse taking assignment for 5W Room 34.

## 2016-09-04 NOTE — Consult Note (Signed)
Reason for Consult: Crohn's abnormal CAT scan Referring Physician: Hospital team  Daniel Howard is an 60 y.o. male.  HPI: Patient seen and examined and case discussed with his wife as well as hospital computer chart was reviewed and is on sick for 4 days but has been improved overall on Humira every 2 weeks but I do not know if he is still on Imuran or not but was based on his last outpatient visit with his primary gastroenterologist and his previous workup was reviewed and he does have mucus coming out of his rectum but it has not been bothering him and he has no urinary complaints and he has not had any fever and his only vomited one time but has had only minimal out his ostomy but no skin or joint or other complaints and he has not been back to [facility] in a while  Past Medical History:  Diagnosis Date  . Abnormal finding of biliary tract    MRCP shows pancreatic/biliary tract dilation. EUS 2010 confirmed dilation but no chronic pancreaitis or mass. Vascular ectasia crimpoing distal CBD.   [name] Anxiety   . 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 Duodenal ulcer 2010   nsaids  . Low back pain   . Peristomal hernia   . Small bowel obstruction ([place])   . Spigelian hernia    bilateral    Past Surgical History:  Procedure Laterality Date  . APPENDECTOMY  1992   at time of exp laparotomy at which time he was noted to have fistulizing Crohn's rather than UC  . COLONOSCOPY [name] 08/23/2014   [id number] proctoscopy with possible fistulous opening in thebase of rectal/anal stump.    . COLOSTOMY  1992   transverse loop colostomy secondary to a stricture  .  ESOPHAGOGASTRODUODENOSCOPY  05/2009   SLF: multiple antral erosions, large ulcer at ansatomosis (postsurgical changes at duodenal bulb and second portion of duodenum) BX c/x NSAIDS.  . EUS  10/04/2009   Dr. Estill Bakes dilated CBD and main pancreatic duct.  No pancreatic  . FLEXIBLE SIGMOIDOSCOPY  1988   Dr. Laural Golden- suggested rohn's disease but the biopsies were not collaborative.  [name] HEMICOLOECTOMY W/ ANASTOMOSIS  1993   R- Dr.DeMason   . HERNIA REPAIR  1996   incarcerated periostial hernia with additional surgery in 1999    Family History  Problem Relation Age of Onset  . Cancer Father     prostate   . Prostate cancer Father   . Hypertension Sister   . Cancer Sister   . Depression Sister   . Breast cancer Sister   . COPD Sister   . Aneurysm Brother     deceased, brain aneurysm    Social History:  reports that he quit smoking about 6 years ago. His smoking use included Cigarettes. He has a 1.00 pack-year smoking history. He has never used smokeless tobacco. He reports that he does not drink alcohol or use drugs.  Allergies:  Allergies  Allergen Reactions  . Penicillins Hives    Medications: I have reviewed the patient's current medications.  Results for orders placed or performed during the hospital encounter of 09/04/16 (from the past 48 hour(s))  Lipase, blood  Status: None   Collection Time: 09/04/16  9:53 AM  Result Value Ref Range   Lipase 17 11 - 51 U/L  Comprehensive metabolic panel     Status: Abnormal   Collection Time: 09/04/16  9:53 AM  Result Value Ref Range   Sodium 134 (L) 135 - 145 mmol/L   Potassium 3.9 3.5 - 5.1 mmol/L   Chloride 100 (L) 101 - 111 mmol/L   CO2 26 22 - 32 mmol/L   Glucose, Bld 97 65 - 99 mg/dL   BUN 9 6 - 20 mg/dL   Creatinine, Ser 1.33 (H) 0.61 - 1.24 mg/dL   Calcium 8.8 (L) 8.9 - 10.3 mg/dL   Total Protein 7.0 6.5 - 8.1 g/dL   Albumin 3.0 (L) 3.5 - 5.0 g/dL   AST 19 15 - 41 U/L   ALT 12 (L) 17 - 63 U/L   Alkaline  Phosphatase 47 38 - 126 U/L   Total Bilirubin 1.3 (H) 0.3 - 1.2 mg/dL   GFR calc non Af Amer 57 (L) >60 mL/min   GFR calc Af Amer >60 >60 mL/min    Comment: (NOTE) The eGFR has been calculated using the CKD EPI equation. This calculation has not been validated in all clinical situations. eGFR's persistently <60 mL/min signify possible Chronic Kidney Disease.    Anion gap 8 5 - 15  CBC     Status: Abnormal   Collection Time: 09/04/16  9:53 AM  Result Value Ref Range   WBC 10.5 4.0 - 10.5 K/uL   RBC 3.96 (L) 4.22 - 5.81 MIL/uL   Hemoglobin 12.4 (L) 13.0 - 17.0 g/dL   HCT 37.5 (L) 39.0 - 52.0 %   MCV 94.7 78.0 - 100.0 fL   MCH 31.3 26.0 - 34.0 pg   MCHC 33.1 30.0 - 36.0 g/dL   RDW 13.0 11.5 - 15.5 %   Platelets 334 150 - 400 K/uL  Magnesium     Status: None   Collection Time: 09/04/16  3:58 PM  Result Value Ref Range   Magnesium 1.9 1.7 - 2.4 mg/dL  Phosphorus     Status: None   Collection Time: 09/04/16  3:58 PM  Result Value Ref Range   Phosphorus 4.6 2.5 - 4.6 mg/dL    Ct Abdomen Pelvis W Contrast  Result Date: 09/04/2016 CLINICAL DATA:  Abdominal pain underlying ileostomy for 2 days. No ileostomy output. Nausea. Crohn' s disease. EXAM: CT ABDOMEN AND PELVIS WITH CONTRAST TECHNIQUE: Multidetector CT imaging of the abdomen and pelvis was performed using the standard protocol following bolus administration of intravenous contrast. CONTRAST:  144m ISOVUE-300 IOPAMIDOL (ISOVUE-300) INJECTION 61% COMPARISON:  Multiple exams, including 10/10/2014 and MRI from 10/23/2014 FINDINGS: Lower chest: Mild scarring in the posterior basal segment left lower lobe. Mild dependent subsegmental atelectasis in the lower lobes. Hepatobiliary: Indistinct extrahepatic biliary tree. Otherwise unremarkable. Pancreas: Dilated dorsal pancreatic duct measuring up to 5 mm in diameter, but stable from 2015. Spleen: Stable small left inferior splenic hemangioma, 1.2 cm in long axis on image 27/2.  Adrenals/Urinary Tract: 4 mm hypodense lesion anteriorly in the left mid kidney, likely a cyst but technically nonspecific. Adrenal glands unremarkable. Stomach/Bowel: Most of the stomach body is collapsed. Poor definition of the descending duodenum. Accentuated mucosal enhancement in a 10 cm loop of small bowel extending to the ostomy. Just proximal to this, a focally dilated loop of small bowel measures 6.3 cm in diameter on image 50/2, and there other loops of dilated small bowel  measuring up to about 4 cm. There is segments of small bowel abnormal mucosal enhancement in scattered air-fluid levels. Moreover, on image 20 series 2 there is a Solicitor hernia believed to be involving small bowel and extending through the transverse abdominis muscle but not the oblique musculature. However, I am doubtful that this small Richter hernia is a site of obstruction. 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 There is a new masslike density along the right lateral rectal margin and right posterior margin of the prostate gland, measuring 3.1 by 4.3 cm on image 73/2 and extending in the perirectal space nearly to the operator internus muscle. There is increased stranding in the perirectal space and probably new inflammatory or neoplastic findings in the left lower perirectal space and along the external sphincter on the left. Appearance on images 81-88 of series 2 raises the possibility of a perianal fistula extending to the intergluteal fold. Vascular/Lymphatic: Mesenteric nodes in the right abdomen measure up to 1.2 cm in short axis, some with prior. Reproductive: Mildly asymmetry of the right seminal vesicle. Possibly abnormal enhancement in the peripheral zone of the  right prostate gland for example on image 74/2. Other: Diffuse mesenteric edema. Trace ascites along the right paracolic gutter. Paucity of intra-abdominal adipose tissue. Musculoskeletal: Fused sacroiliac joints. Markedly dilated inferior gluteal vein varicosities, image 80/5 on the right and image 84/5 on the left - this can be a cause for sciatica due to impingement on the sciatic nerves. IMPRESSION: 1. 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 There is abnormal mucosal enhancement in multiple segments of small bowel more characteristic of Crohn's disease, with intermittent dilated in nondilated segments. The largest dilated segment of small bowel is in the right abdomen about 10 cm proximal to the ostomy, and measures 6.3 cm in diameter. There is a somewhat abrupt angulation between the dilated segment of right-sided abdominal small bowel and the nondilated segment leading to the ostomy which has accentuated mucosal enhancement. The possibility of a stricture around image 53 of series 2 is not totally excluded. 2. There is an apparent mass along the right side of the rectum measuring 4.3 by 3.1 cm, extending through the external sphincter and abutting the operator internus. This mass also extends along the right posterolateral margin of the prostate gland where there some abnormal enhancement in the peripheral zone. This could be arising from the rectum or prostate. I am suspicious for cancer. 3. Left perirectal inflammatory findings with a possible left perianal fistula extending to the left side of the intergluteal fold. 4. Prominently dilated inferior gluteal veins, this can cause sciatica. 5. 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 6. Stably dilated dorsal pancreatic duct at 5  mm in diameter, not changed from 2015. 7. Diffuse mesenteric edema with paucity of intra-abdominal adipose tissue. 8. Few sacroiliac joints. Electronically Signed   By: Van Clines M.D.   On: 09/04/2016 11:31    ROS negative except above he has lost about 20 pounds in 3-6 months Blood pressure 123/75, pulse 80, temperature 98.7  F (37.1 C), temperature source Oral, resp. rate 18, height 5' 4.5" (1.638 m), weight 59 kg (130 lb), SpO2 100 %. Physical Exam vital signs stable afebrile patient looks better than CT implies abdomen is soft and not too tender occasional bowel sounds labs and CT reviewed  Assessment/Plan: Crohn's disease with abnormal CAT scan Plan: Will add steroids IV in case this is a Crohn's flare and he will either need a repeat flexible sigmoidoscopy or CT directed biopsy at some point and we'll check some tumor markers and sedimentation rate and he can probably have sips of clear liquids and we need to find out whether he is on Imuran at home in which case I would restart that and we will check on tomorrow  [facility] E 09/04/2016, 6:39 PM

## 2016-09-04 NOTE — Progress Notes (Signed)
Report received from [place], [place] in ED, will await for pt. To arrive to floor.  Alphonzo Lemmings, RN

## 2016-09-04 NOTE — ED Triage Notes (Signed)
Per EMS, patient has had abdominal pain x 3 days.  Patient lives at home.  Pt has a hx of Chrons Disease with an ileostomy.  Pt states that his ileostomy has not drained for 2 days.  Pt states a decrease in appetite and drinking fluids.  EMS states that heart rate increases upon standing.  Allergic to penicillin.  Uses hydrocodone for pain.  Patient states that nausea comes and goes.  18G LAC, Bolus of NS given in truck.  118/79, 79HR, 98% RA.

## 2016-09-04 NOTE — ED Provider Notes (Signed)
[facility] DEPT Provider Note   CSN: [id number] Arrival date & time: 09/04/16  0623     History   Chief Complaint Chief Complaint  Patient presents with  . Abdominal Pain    HPI Daniel Howard is a 59 y.o. male.  60 yo M with a chief complaint of right lower quadrant pain. This been going on for the past couple days. Patient is not had any output from his ostomy for the past couple days either. Having some nausea denies vomiting. Denies fevers or chills. Pain is constant he feels is localized at his ostomy site. Worsening over past couple of days.   The history is provided by the patient.  Abdominal Pain   This is a new problem. The current episode started yesterday. The problem occurs constantly. The problem has not changed since onset.The pain is associated with eating. The pain is located in the RLQ. The pain is at a severity of 10/10. The pain is severe. Associated symptoms include nausea and constipation. Pertinent negatives include fever, diarrhea, vomiting, headaches, arthralgias and myalgias. Nothing aggravates the symptoms. Nothing relieves the symptoms.    Past Medical History:  Diagnosis Date  . Abnormal finding of biliary tract    MRCP shows pancreatic/biliary tract dilation. EUS 2010 confirmed dilation but no chronic pancreaitis or mass. Vascular ectasia crimpoing distal CBD.   [name] Anxiety   . 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 Duodenal ulcer 2010   nsaids  . Low back pain   . Peristomal hernia   . Small bowel obstruction ([place])   . Spigelian hernia    bilateral    Patient Active Problem List   Diagnosis Date  Noted  . Dehydration with hyponatremia 09/04/2016  . Rectal mass 09/04/2016  . 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 Exacerbation of Crohn's disease ([place]) 09/04/2016  . Trigger finger, acquired 10/31/2015  . Protein-calorie malnutrition, severe ([place]) 07/27/2014  . SBO (small bowel obstruction) ([place]) 07/26/2014  . Loss of weight 06/09/2014  . Hypotension, unspecified 06/09/2014  . Crohn's disease of both small and large intestine with complication ([place]) 76/28/3151  . Boils 02/11/2013  . Ventral hernia 12/11/2009  . Anemia 12/03/2009  . 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 LOW BACK PAIN 01/25/2007    Past Surgical History:  Procedure Laterality Date  . APPENDECTOMY  1992   at time of exp laparotomy at which time he was noted to have fistulizing Crohn's rather than UC  . COLONOSCOPY [name] 08/23/2014   VOH:YWVPXTG proctoscopy with possible fistulous opening in thebase of rectal/anal stump.    . COLOSTOMY  1992   transverse loop colostomy secondary to a stricture  . ESOPHAGOGASTRODUODENOSCOPY  05/2009   SLF: multiple antral erosions, large ulcer at ansatomosis (postsurgical changes at duodenal bulb and second portion of duodenum) BX c/x NSAIDS.  . EUS  10/04/2009   Dr. Estill Bakes dilated CBD and main pancreatic duct.  No pancreatic  . FLEXIBLE SIGMOIDOSCOPY  1988   Dr. Laural Golden- suggested rohn's disease but the biopsies were not collaborative.  [name] HEMICOLOECTOMY W/ ANASTOMOSIS  1993   R- Dr.DeMason   . HERNIA REPAIR  1996   incarcerated periostial hernia with additional surgery in 1999  Home Medications    Prior to Admission medications   Medication Sig Start Date End Date Taking? Authorizing Provider  Adalimumab (HUMIRA PEN) 40 MG/0.8ML PNKT Inject 40 mg into the skin every 14 (fourteen) days. 05/23/16  Yes Mahala Menghini, PA-C  calcium carbonate (OS-CAL) 600 MG TABS tablet Take 1,200 mg by mouth daily with breakfast.   Yes Historical Provider, MD  Ferrous Sulfate 28 MG TABS  Take 1 tablet by mouth daily.   Yes Historical Provider, MD  HYDROcodone-acetaminophen (NORCO/VICODIN) 5-325 MG tablet Take 1 tablet by mouth every 6 (six) hours as needed for moderate pain. 07/01/16  Yes Alycia Rossetti, MD  mirtazapine (REMERON) 15 MG tablet Take 1 tablet (15 mg total) by mouth at bedtime. 07/29/16  Yes Alycia Rossetti, MD  Multiple Vitamins-Minerals (CENTRUM SILVER ADULT 50+) TABS Take 1 tablet by mouth daily.   Yes Historical Provider, MD    Family History Family History  Problem Relation Age of Onset  . Cancer Father     prostate   . Prostate cancer Father   . Hypertension Sister   . Cancer Sister   . Depression Sister   . Breast cancer Sister   . COPD Sister   . Aneurysm Brother     deceased, brain aneurysm    Social History Social History  Substance Use Topics  . Smoking status: Former Smoker    Packs/day: 0.50    Years: 2.00    Types: Cigarettes    Quit date: 12/21/2009  . Smokeless tobacco: Never Used     Comment: Quit abut 20 years  . Alcohol use No     Comment: Former drinker     Allergies   Penicillins   Review of Systems Review of Systems  Constitutional: Negative for chills and fever.  HENT: Negative for congestion and facial swelling.   Eyes: Negative for discharge and visual disturbance.  Respiratory: Negative for shortness of breath.   Cardiovascular: Negative for chest pain and palpitations.  Gastrointestinal: Positive for abdominal pain, constipation and nausea. Negative for diarrhea and vomiting.  Musculoskeletal: Negative for arthralgias and myalgias.  Skin: Negative for color change and rash.  Neurological: Negative for tremors, syncope and headaches.  Psychiatric/Behavioral: Negative for confusion and dysphoric mood.     Physical Exam Updated Vital Signs BP 123/75 (BP Location: Left Arm)   Pulse 80   Temp 98.7 F (37.1 C) (Oral)   Resp 18   Ht 5' 4.5" (1.638 m)   Wt 130 lb (59 kg)   SpO2 100%   BMI 21.97 kg/m    Physical Exam  Constitutional: He is oriented to person, place, and time. He appears well-developed and well-nourished.  HENT:  Head: Normocephalic and atraumatic.  Eyes: Conjunctivae and EOM are normal. Pupils are equal, round, and reactive to light.  Neck: Normal range of motion. No JVD present.  Cardiovascular: Normal rate and regular rhythm.   Pulmonary/Chest: Effort normal. No stridor. No respiratory distress.  Abdominal: He exhibits no distension. There is tenderness (TTP about the RLQ). There is no guarding.  Old large midline incision noted  Musculoskeletal: Normal range of motion. He exhibits no edema.  Neurological: He is alert and oriented to person, place, and time.  Skin: Skin is warm and dry.  Psychiatric: He has a normal mood and affect. His behavior is normal.     ED Treatments / Results  Labs (all labs ordered are listed, but only abnormal results are displayed) Labs Reviewed  COMPREHENSIVE METABOLIC  PANEL - Abnormal; Notable for the following:       Result Value   Sodium 134 (*)    Chloride 100 (*)    Creatinine, Ser 1.33 (*)    Calcium 8.8 (*)    Albumin 3.0 (*)    ALT 12 (*)    Total Bilirubin 1.3 (*)    GFR calc non Af Amer 57 (*)    All other components within normal limits  CBC - Abnormal; Notable for the following:    RBC 3.96 (*)    Hemoglobin 12.4 (*)    HCT 37.5 (*)    All other components within normal limits  LIPASE, BLOOD  MAGNESIUM  PHOSPHORUS  COMPREHENSIVE METABOLIC PANEL  CBC    EKG  EKG Interpretation None       Radiology Ct Abdomen Pelvis W Contrast  Result Date: 09/04/2016 CLINICAL DATA:  Abdominal pain underlying ileostomy for 2 days. No ileostomy output. Nausea. Crohn' s disease. EXAM: CT ABDOMEN AND PELVIS WITH CONTRAST TECHNIQUE: Multidetector CT imaging of the abdomen and pelvis was performed using the standard protocol following bolus administration of intravenous contrast. CONTRAST:  165m ISOVUE-300 IOPAMIDOL  (ISOVUE-300) INJECTION 61% COMPARISON:  Multiple exams, including 10/10/2014 and MRI from 10/23/2014 FINDINGS: Lower chest: Mild scarring in the posterior basal segment left lower lobe. Mild dependent subsegmental atelectasis in the lower lobes. Hepatobiliary: Indistinct extrahepatic biliary tree. Otherwise unremarkable. Pancreas: Dilated dorsal pancreatic duct measuring up to 5 mm in diameter, but stable from 2015. Spleen: Stable small left inferior splenic hemangioma, 1.2 cm in long axis on image 27/2. Adrenals/Urinary Tract: 4 mm hypodense lesion anteriorly in the left mid kidney, likely a cyst but technically nonspecific. Adrenal glands unremarkable. Stomach/Bowel: Most of the stomach body is collapsed. Poor definition of the descending duodenum. Accentuated mucosal enhancement in a 10 cm loop of small bowel extending to the ostomy. Just proximal to this, a focally dilated loop of small bowel measures 6.3 cm in diameter on image 50/2, and there other loops of dilated small bowel measuring up to about 4 cm. There is segments of small bowel abnormal mucosal enhancement in scattered air-fluid levels. Moreover, on image 427series 2 there is a sSolicitorhernia believed to be involving small bowel and extending through the transverse abdominis muscle but not the oblique musculature. However, I am doubtful that this small Richter hernia is a site of obstruction. 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 There is a new masslike density along the right lateral rectal margin and right posterior margin of the prostate gland, measuring 3.1 by 4.3 cm on image 73/2 and extending in the perirectal space nearly to the operator internus muscle. There is increased stranding in  the perirectal space and probably new inflammatory or neoplastic findings in the left lower perirectal space and along the external sphincter on the left. Appearance on images 81-88 of series 2 raises the possibility of a perianal fistula extending to the intergluteal fold. Vascular/Lymphatic: Mesenteric nodes in the right abdomen measure up to 1.2 cm in short axis, some with prior. Reproductive: Mildly asymmetry of the right seminal vesicle. Possibly abnormal enhancement in the peripheral zone of the right prostate gland for example on image 74/2. Other: Diffuse mesenteric edema. Trace ascites along  the right paracolic gutter. Paucity of intra-abdominal adipose tissue. Musculoskeletal: Fused sacroiliac joints. Markedly dilated inferior gluteal vein varicosities, image 80/5 on the right and image 84/5 on the left - this can be a cause for sciatica due to impingement on the sciatic nerves. IMPRESSION: 1. 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 There is abnormal mucosal enhancement in multiple segments of small bowel more characteristic of Crohn's disease, with intermittent dilated in nondilated segments. The largest dilated segment of small bowel is in the right abdomen about 10 cm proximal to the ostomy, and measures 6.3 cm in diameter. There is a somewhat abrupt angulation between the dilated segment of right-sided abdominal small bowel and the nondilated segment leading to the ostomy which has accentuated mucosal enhancement. The possibility of a stricture around image 53 of series 2 is not totally excluded. 2. There is an apparent mass along the right side of the rectum measuring 4.3 by 3.1 cm, extending through the external sphincter and abutting the operator internus. This mass also  extends along the right posterolateral margin of the prostate gland where there some abnormal enhancement in the peripheral zone. This could be arising from the rectum or prostate. I am suspicious for cancer. 3. Left perirectal inflammatory findings with a possible left perianal fistula extending to the left side of the intergluteal fold. 4. Prominently dilated inferior gluteal veins, this can cause sciatica. 5. 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 6. Stably dilated dorsal pancreatic duct at 5 mm in diameter, not changed from 2015. 7. Diffuse mesenteric edema with paucity of intra-abdominal adipose tissue. 8. Few sacroiliac joints. Electronically Signed   By: Van Clines M.D.   On: 09/04/2016 11:31    Procedures Procedures (including critical care time)  Medications Ordered in ED Medications  iopamidol (ISOVUE-300) 61 % injection (not administered)  dextrose 5 % and 0.9 % NaCl with KCl 20 mEq/L infusion (1,000 mLs Intravenous New Bag/Given 09/04/16 1658)  morphine 2 MG/ML injection 1-4 mg (4 mg Intravenous Given 09/04/16 1437)  ondansetron (ZOFRAN) injection 4 mg (not administered)    Or  promethazine (PHENERGAN) injection 25 mg (not administered)  mirtazapine (REMERON) tablet 15 mg (not administered)  enoxaparin (LOVENOX) injection 40 mg (40 mg Subcutaneous Given 09/04/16 1702)  acetaminophen (TYLENOL) tablet 650 mg (not administered)    Or  acetaminophen (TYLENOL) suppository 650 mg (not administered)  morphine 4 MG/ML injection 4 mg (4 mg Intravenous Given 09/04/16 1016)  ondansetron (ZOFRAN) injection 4 mg (4 mg Intravenous Given 09/04/16 1016)  iopamidol (ISOVUE-300) 61 % injection 100 mL (100 mLs Intravenous Contrast Given 09/04/16 1053)  morphine 4 MG/ML injection 4 mg (4 mg Intravenous Given 09/04/16 1232)     Initial Impression / Assessment and Plan / ED Course  I have reviewed the triage vital signs and the nursing notes.  Pertinent labs & imaging results that were  available during my care of the patient were reviewed by me and considered in my medical decision making (see chart for details).  Clinical Course    60 yo M with a cc of RLQ abdominal pain.  No output from his ostomy for past couple days.  Concern for obstruction, CT.    CT with obstruction.  Discussed with general surgery, GI and medicine.  Will admit.  The patients results and plan were reviewed and discussed.   Any x-rays performed were independently reviewed by myself.   Differential diagnosis were considered with the presenting HPI.  Medications  iopamidol (ISOVUE-300) 61 % injection (not administered)  dextrose 5 % and 0.9 % NaCl with KCl 20 mEq/L infusion (1,000 mLs Intravenous New Bag/Given 09/04/16 1658)  morphine 2 MG/ML injection 1-4 mg (4 mg Intravenous Given 09/04/16 1437)  ondansetron (ZOFRAN) injection 4 mg (not administered)    Or  promethazine (PHENERGAN) injection 25 mg (not administered)  mirtazapine (REMERON) tablet 15 mg (not administered)  enoxaparin (LOVENOX) injection 40 mg (40 mg Subcutaneous Given 09/04/16 1702)  acetaminophen (TYLENOL) tablet 650 mg (not administered)    Or  acetaminophen (TYLENOL) suppository 650 mg (not administered)  morphine 4 MG/ML injection 4 mg (4 mg Intravenous Given 09/04/16 1016)  ondansetron (ZOFRAN) injection 4 mg (4 mg Intravenous Given 09/04/16 1016)  iopamidol (ISOVUE-300) 61 % injection 100 mL (100 mLs Intravenous Contrast Given 09/04/16 1053)  morphine 4 MG/ML injection 4 mg (4 mg Intravenous Given 09/04/16 1232)    Vitals:   09/04/16 1430 09/04/16 1445 09/04/16 1500 09/04/16 1542  BP: 115/68 121/77 111/66 123/75  Pulse: 66 78  80  Resp: 20 20 19 18   Temp:    98.7 F (37.1 C)  TempSrc:    Oral  SpO2: 99% 98% 99% 100%  Weight:      Height:        Final diagnoses:  SBO (small bowel obstruction) ([place])    Admission/ observation were discussed with the admitting physician, patient and/or family and they are  comfortable with the plan.     Final Clinical Impressions(s) / ED Diagnoses   Final diagnoses:  SBO (small bowel obstruction) [facility])    New Prescriptions Current Discharge Medication List       Deno Etienne, DO 09/04/16 1827

## 2016-09-05 LAB — COMPREHENSIVE METABOLIC PANEL
ALBUMIN: 2.9 g/dL — AB (ref 3.5–5.0)
ALK PHOS: 48 U/L (ref 38–126)
ALT: 10 U/L — ABNORMAL LOW (ref 17–63)
AST: 16 U/L (ref 15–41)
Anion gap: 8 (ref 5–15)
BILIRUBIN TOTAL: 0.7 mg/dL (ref 0.3–1.2)
BUN: 9 mg/dL (ref 6–20)
CALCIUM: 9.2 mg/dL (ref 8.9–10.3)
CO2: 26 mmol/L (ref 22–32)
Chloride: 104 mmol/L (ref 101–111)
Creatinine, Ser: 1.21 mg/dL (ref 0.61–1.24)
GFR calc Af Amer: >60 mL/min (ref >60)
GFR calc non Af Amer: >60 mL/min (ref >60)
GLUCOSE: 156 mg/dL — AB (ref 65–99)
Potassium: 4.4 mmol/L (ref 3.5–5.1)
Sodium: 138 mmol/L (ref 135–145)
TOTAL PROTEIN: 7.3 g/dL (ref 6.5–8.1)

## 2016-09-05 LAB — CBC
HCT: 40.1 % (ref 39.0–52.0)
Hemoglobin: 13.6 g/dL (ref 13.0–17.0)
MCH: 32.2 pg (ref 26.0–34.0)
MCHC: 33.9 g/dL (ref 30.0–36.0)
MCV: 94.8 fL (ref 78.0–100.0)
PLATELETS: 383 10*3/uL (ref 150–400)
RBC: 4.23 MIL/uL (ref 4.22–5.81)
RDW: 13.1 % (ref 11.5–15.5)
WBC: 6.9 10*3/uL (ref 4.0–10.5)

## 2016-09-05 LAB — PSA: PSA: 7.08 ng/mL — AB (ref 0.00–4.00)

## 2016-09-05 LAB — SEDIMENTATION RATE: SED RATE: 58 mm/h — AB (ref 0–16)

## 2016-09-05 NOTE — Progress Notes (Signed)
Daniel Howard RDE:[id number] DOB: 1956/07/16 DOA: 09/04/2016 PCP: Vic Blackbird, MD  Brief narrative: 75 ? Known h/o ileocolonic crohn's-ileocecostomy + ileostomy-managed at [facility], Dr. Sydell Axon in [place]  -At baseline has 3-5 bowel movements that are usually formed  Prior rectal stricture 3 cm--not a good candidate for stricture repair H/o multiple hernia Previously on Humira Iron deficiency anemia  protein energy malnutrition  Admitted from the emergency room 08/25/2016 abdominal pain and possible SBO with perirectal mass being possible potentially secondary to Crohn's flare   On imaging of abdomen round mass posterolateral margin of the prostate gland concerning for recurrence PSA is 7.08  Past medical history-As per Problem list Chart reviewed as below-   Consultants:  gen surg  gi  Procedures:    Antibiotics:  none   Subjective  Alert pleasant oriented no apparent distress Able to tolerate diet today No nausea no vomiting No chest pain Eating and drinking somewhat.    Objective     Objective: Vitals:   09/04/16 1500 09/04/16 1542 09/04/16 2129 09/05/16 0545  BP: 111/66 123/75 118/71 101/71  Pulse:  80 87 60  Resp: 19 18 16 16   Temp:  98.7 F (37.1 C) 99.4 F (37.4 C) 97.9 F (36.6 C)  TempSrc:  Oral Oral Oral  SpO2: 99% 100% 97% 98%  Weight:    60.4 kg (133 lb 2.5 oz)  Height:        Intake/Output Summary (Last 24 hours) at 09/05/16 1056 Last data filed at 09/05/16 0653  Gross per 24 hour  Intake              227 ml  Output              175 ml  Net               52 ml    Exam:  General: eomi ncat Cardiovascular: s1 s 2no m/r/g Respiratory: clear no added sound, no wheeze no rhonchi no rales Abdomen: soft.  Ostomy +, multiple surgical scars and hemorrhoids Skin no le edema Neuro intact, cn 2-12 intact  Data Reviewed: Basic Metabolic Panel:  Recent Labs Lab 08/30/16 2003  09/04/16 0953 09/04/16 1558 09/05/16 0748  NA 135 134*  --  138  K 4.5 3.9  --  4.4  CL 99* 100*  --  104  CO2 28 26  --  26  GLUCOSE 90 97  --  156*  BUN 11 9  --  9  CREATININE 1.40* 1.33*  --  1.21  CALCIUM 9.4 8.8*  --  9.2  MG  --   --  1.9  --   PHOS  --   --  4.6  --    Liver Function Tests:  Recent Labs Lab 08/30/16 2003 09/04/16 0953 09/05/16 0748  AST 20 19 16   ALT 15* 12* 10*  ALKPHOS 48 47 48  BILITOT 1.1 1.3* 0.7  PROT 7.7 7.0 7.3  ALBUMIN 3.4* 3.0* 2.9*    Recent Labs Lab 08/30/16 2003 09/04/16 0953  LIPASE 22 17   No results for input(s): AMMONIA in the last 168 hours. CBC:  Recent Labs Lab 08/30/16 2003 09/04/16 0953 09/05/16 0748  WBC 6.6 10.5 6.9  HGB 12.6* 12.4* 13.6  HCT 38.1* 37.5* 40.1  MCV 94.5 94.7 94.8  PLT 367 334 383   Cardiac Enzymes: No results for input(s): CKTOTAL, CKMB, CKMBINDEX, TROPONINI in the last 168 hours. BNP: Invalid  input(s): POCBNP CBG: No results for input(s): GLUCAP in the last 168 hours.  No results found for this or any previous visit (from the past 240 hour(s)).   Studies:              All Imaging reviewed and is as per above notation   Scheduled Meds: . enoxaparin (LOVENOX) injection  40 mg Subcutaneous Q24H  . feeding supplement  1 Container Oral TID BM  . methylPREDNISolone (SOLU-MEDROL) injection  40 mg Intravenous Q8H  . mirtazapine  15 mg Oral QHS   Continuous Infusions: . dextrose 5 % and 0.9 % NaCl with KCl 20 mEq/L 125 mL/hr at 09/05/16 0809     Assessment/Plan:  1. SBO 2/2 to crohn/Stricture-likely resolving at present. Noted high-grade partial obstruction proximal to the ileostomy-GI rec Solu-medroel 40 IV q 8-appreciate recommendations and await decision regarding flexible sigmoidoscopy--continue Humira, d5/0.9 NS 125 cc/h today 2. Prostate cancer is secondary to elevated PSA, mass noted on CT scan-d/w urology Dr. Louis Meckel who believes can work up as OP 3. Volume depletion with mild  hyponatremia sodium 134 BUN/creatinine admission 9/1.33--9/1.21 doing better. Monitor. Continue IV fluids. Gradually diet as per above for GI 4. Moderate to severe protein energy malnutrition. Has lost weight probably secondary to disease but also may be a reflection of his prostate isues which will nee dto be worked up further. Needs nutritionist input. 5. Iron def anemia-monitor   Anticipate d/w am Given number for urology and called patient to inform for need OP follow-up  Verneita Griffes, MD  [facility] Pager [phone] 09/05/2016, 10:56 AM    LOS: 1 day

## 2016-09-05 NOTE — Progress Notes (Signed)
Patient ID: Daniel Howard, male   DOB: [birth year], 58 y.o.   MRN: [id number]  [name] Surgery Progress Note     Subjective: Feels much better today, denies current abdominal pain, nausea, vomiting. Reports stool and gas in ostomy.   Objective: Vital signs in last 24 hours: Temp:  [97.8 F (36.6 C)-99.4 F (37.4 C)] 97.9 F (36.6 C) (09/15 0545) Pulse Rate:  [60-90] 60 (09/15 0545) Resp:  [12-28] 16 (09/15 0545) BP: (101-126)/(59-83) 101/71 (09/15 0545) SpO2:  [97 %-100 %] 98 % (09/15 0545) Weight:  [130 lb (59 kg)-133 lb 2.5 oz (60.4 kg)] 133 lb 2.5 oz (60.4 kg) (09/15 0545) Last BM Date: 09/05/16  Intake/Output from previous day: 09/14 0701 - 09/15 0700 In: 227 [P.O.:227] Out: 175 [Urine:175] Intake/Output this shift: No intake/output data recorded.  PE: Gen:  Thin, Alert, NAD, pleasant Pulm:  CTAB, no W/R/R Abd: Soft, NT/ND, +BS, ostomy bag with loose dark green stool  Lab Results:   Recent Labs  09/04/16 0953  WBC 10.5  HGB 12.4*  HCT 37.5*  PLT 334   BMET  Recent Labs  09/04/16 0953  NA 134*  K 3.9  CL 100*  CO2 26  GLUCOSE 97  BUN 9  CREATININE 1.33*  CALCIUM 8.8*   PT/INR No results for input(s): LABPROT, INR in the last 72 hours. CMP     Component Value Date/Time   NA 134 (L) 09/04/2016 0953   K 3.9 09/04/2016 0953   CL 100 (L) 09/04/2016 0953   CO2 26 09/04/2016 0953   GLUCOSE 97 09/04/2016 0953   BUN 9 09/04/2016 0953   CREATININE 1.33 (H) 09/04/2016 0953   CREATININE 1.38 (H) 08/29/2016 1016   CALCIUM 8.8 (L) 09/04/2016 0953   PROT 7.0 09/04/2016 0953   ALBUMIN 3.0 (L) 09/04/2016 0953   AST 19 09/04/2016 0953   ALT 12 (L) 09/04/2016 0953   ALKPHOS 47 09/04/2016 0953   BILITOT 1.3 (H) 09/04/2016 0953   GFRNONAA 57 (L) 09/04/2016 0953   GFRNONAA 65 10/29/2015 0954   GFRAA >60 09/04/2016 0953   GFRAA 75 10/29/2015 0954   Lipase     Component Value Date/Time   LIPASE 17 09/04/2016 0953        Studies/Results: Ct Abdomen Pelvis W Contrast  Result Date: 09/04/2016 CLINICAL DATA:  Abdominal pain underlying ileostomy for 2 days. No ileostomy output. Nausea. Crohn' s disease. EXAM: CT ABDOMEN AND PELVIS WITH CONTRAST TECHNIQUE: Multidetector CT imaging of the abdomen and pelvis was performed using the standard protocol following bolus administration of intravenous contrast. CONTRAST:  136m ISOVUE-300 IOPAMIDOL (ISOVUE-300) INJECTION 61% COMPARISON:  Multiple exams, including 10/10/2014 and MRI from 10/23/2014 FINDINGS: Lower chest: Mild scarring in the posterior basal segment left lower lobe. Mild dependent subsegmental atelectasis in the lower lobes. Hepatobiliary: Indistinct extrahepatic biliary tree. Otherwise unremarkable. Pancreas: Dilated dorsal pancreatic duct measuring up to 5 mm in diameter, but stable from 2015. Spleen: Stable small left inferior splenic hemangioma, 1.2 cm in long axis on image 27/2. Adrenals/Urinary Tract: 4 mm hypodense lesion anteriorly in the left mid kidney, likely a cyst but technically nonspecific. Adrenal glands unremarkable. Stomach/Bowel: Most of the stomach body is collapsed. Poor definition of the descending duodenum. Accentuated mucosal enhancement in a 10 cm loop of small bowel extending to the ostomy. Just proximal to this, a focally dilated loop of small bowel measures 6.3 cm in diameter on image 50/2, and there other loops of dilated small bowel measuring up to about  4 cm. There is segments of small bowel abnormal mucosal enhancement in scattered air-fluid levels. Moreover, on image 42 series 2 there is a Solicitor hernia believed to be involving small bowel and extending through the transverse abdominis muscle but not the oblique musculature. However, I am doubtful that this small Richter hernia is a site of obstruction. 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 There is a new masslike density along the right lateral rectal margin and right posterior margin of the prostate gland, measuring 3.1 by 4.3 cm on image 73/2 and extending in the perirectal space nearly to the operator internus muscle. There is increased stranding in the perirectal space and probably new inflammatory or neoplastic findings in the left lower perirectal space and along the external sphincter on the left. Appearance on images 81-88 of series 2 raises the possibility of a perianal fistula extending to the intergluteal fold. Vascular/Lymphatic: Mesenteric nodes in the right abdomen measure up to 1.2 cm in short axis, some with prior. Reproductive: Mildly asymmetry of the right seminal vesicle. Possibly abnormal enhancement in the peripheral zone of the right prostate gland for example on image 74/2. Other: Diffuse mesenteric edema. Trace ascites along the right paracolic gutter. Paucity of intra-abdominal adipose tissue. Musculoskeletal: Fused sacroiliac joints. Markedly dilated inferior gluteal vein varicosities, image 80/5 on the right and image 84/5 on the left - this can be a cause for sciatica due to impingement on the sciatic nerves. IMPRESSION: 1. 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 There is abnormal mucosal enhancement in multiple segments of small bowel more characteristic of Crohn's disease, with intermittent dilated in nondilated segments. The largest dilated segment of small bowel is in the right abdomen about 10 cm proximal to the ostomy, and measures 6.3 cm in diameter. There is a somewhat  abrupt angulation between the dilated segment of right-sided abdominal small bowel and the nondilated segment leading to the ostomy which has accentuated mucosal enhancement. The possibility of a stricture around image 53 of series 2 is not totally excluded. 2. There is an apparent mass along the right side of the rectum measuring 4.3 by 3.1 cm, extending through the external sphincter and abutting the operator internus. This mass also extends along the right posterolateral margin of the prostate gland where there some abnormal enhancement in the peripheral zone. This could be arising from the rectum or prostate. I am suspicious for cancer. 3. Left perirectal inflammatory findings with a possible left perianal fistula extending to the left side of the intergluteal fold. 4. Prominently dilated inferior gluteal veins, this can cause sciatica. 5. 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 6. Stably dilated dorsal pancreatic duct at 5 mm in diameter, not changed from 2015. 7. Diffuse mesenteric edema with paucity of intra-abdominal adipose tissue. 8. Few sacroiliac joints. Electronically Signed   By: Van Clines M.D.   On: 09/04/2016 11:31    Anti-infectives: Anti-infectives    None       Assessment/Plan Small bowel obstruction with probable Crohn's flare vs stricture - abnormal  CT 09/04/16 - high-grade partial obstruction of the ileum just proximal to his ileostomy. Clinically improving, +BM and flatus. Started on soft diet today per GI - GI consulted: started on IV steroids, may transition to prednisone in next 1-2 days Richter and Spigelian hernias - none of these appear to be resulting in obstruction or strangulation. L Rectal mass/R Perirectal fistula - GI consult: Recommends repeat flexibile sigmoidoscopy (prior to discharge or shortly after discharge)) or CT directed biopsy at some point. Tumor markers and sed rate pending. Dehydration with hyponatremia - continue IVF Anemia - hg 12.4  yesterday, continue to monitor Protein-calorie malnutrition, severe - nutrition consult pending  ID - none FEN - soft VTE - lovenox  Plan - clinically improving. diet advanced to soft per GI. GI planning flex sig prior to discharge or shortly after.   LOS: 1 day    Jerrye Beavers , [facility] 09/05/2016, 9:30 AM Pager: [phone] Consults: [phone] Mon-Fri 7:00 am-4:30 pm Sat-Sun 7:00 am-11:30 am

## 2016-09-05 NOTE — Progress Notes (Signed)
Initial Nutrition Assessment  DOCUMENTATION CODES:   Severe malnutrition in context of chronic illness  INTERVENTION:   -Continue Boost Breeze po TID, each supplement provides 250 kcal and 9 grams of protein  NUTRITION DIAGNOSIS:   Malnutrition related to chronic illness as evidenced by moderate depletions of muscle mass, severe depletion of muscle mass, moderate depletion of body fat, severe depletion of body fat, percent weight loss.  GOAL:   Patient will meet greater than or equal to 90% of their needs  MONITOR:   PO intake, Supplement acceptance, Diet advancement, Labs, Skin, I & O's  REASON FOR ASSESSMENT:   Malnutrition Screening Tool    ASSESSMENT:   Daniel Howard is a 60 y.o. male with a Past Medical History of anxiety, Crohn's, duodenal ulcers due to NSAIDs, SBOs, spigelian hernia, hemicolectomy w/ colostomy  who presents with SBO, possible perirectal mass, all likely from crohn's flare w/ suppressed immune system.  Pt admitted with abdominal pain and possible SBO with perirectal mass being possible potentially secondary to Crohn's.   Spoke with pt at bedside. He reveals appetite is generally fair to poor. He consumes 4 small meals daily PTA and reports following a low fat diet (frequently consumed foods include grilled and baked chicken) because "greasy stuff messes my stomach up". Pt shares with this RD that he has a very supportive wife who ensure he complies with medical recommendations.   Noted solid food on breakfast tray was untouched. Pt reports he received a clear liquid breakfast tray this morning and is currently "filled up on liquids". Pt likes Boost Breeze supplements and reveals he will consume the one provided to him later. RN reports he has been requesting supplements. He is inconsistent with supplement use "because the clear one are hard to get". Pt does not like Ensure. Encouraged importance of good protein and supplement intake to promote weight gain  and preservation of muscle mass.   Pt reports UBW is around 145-150#. He expressed concern about weight loss. Per wt hx, pt has experienced a 22# (14%) wt loss over the past 6 months, which is significant for time frame.   Nutrition-Focused physical exam completed. Findings are moderate to severe fat depletion, moderate to severe muscle depletion, and no edema.   Case discussed with RN.   Labs reviewed.   Diet Order:  DIET SOFT Room service appropriate? Yes; Fluid consistency: Thin  Skin:  Reviewed, no issues  Last BM:  09/05/16  Height:   Ht Readings from Last 1 Encounters:  09/04/16 5' 4.5" (1.638 m)    Weight:   Wt Readings from Last 1 Encounters:  09/05/16 133 lb 2.5 oz (60.4 kg)    Ideal Body Weight:  60.5 kg  BMI:  Body mass index is 22.5 kg/m.  Estimated Nutritional Needs:   Kcal:  1900-2100  Protein:  90-105 grams  Fluid:  1.9-2.1 L  EDUCATION NEEDS:   Education needs addressed  Venice Liz A. Jimmye Norman, RD, LDN, CDE Pager: [phone] After hours Pager: [phone]

## 2016-09-05 NOTE — Consult Note (Signed)
[facility] CM Primary Care Navigator  09/05/2016  JEWEL VENDITTO 1956/05/20 [id number]  Met with patient at the bedside to identify any possible discharge needs. Patient states no colostomy output with increasing pain for about 2-3 days had led to his admission. Patient endorses Dr. Kevan Rosebush from [place] as the primary care provider.    Patient shared using [facility] to obtain medications and wife manages his medications.  Colostomy supplies per [facility].  Patient verbalized that he usually drives himself or takes a bus for transportation to doctors' appointments. Since wife does not drive, he states they have neighbors Raiford Noble and [place]) who can provide transportation if needed. Patient's wife is primary caregiver when he gets home.   Patient had expressed understanding to call primary care provider's office for a post discharge follow-up appointment within a week or sooner if needs arise. Patient letter provided as a reminder.  He denies further needs or concerns at present.  For additional questions please contact:  Edwena Felty A. Eppie Barhorst, BSN, RN-BC [facility] PRIMARY CARE Navigator Cell: [phone]

## 2016-09-05 NOTE — Progress Notes (Signed)
[facility] Gastroenterology Progress Note  ANTRON SETH 60 y.o. Oct 30, 1956   Subjective: Feels a lot better now. Reports volume and consistency of stool in ostomy is close to baseline. Denies abdominal pain. Hungry. Reports last took Imuran 4-5 months ago when he was taken off of Imuran and placed on Humira. Niece and nephew at bedside.   Objective: Vital signs in last 24 hours: Vitals:   09/04/16 2129 09/05/16 0545  BP: 118/71 101/71  Pulse: 87 60  Resp: 16 16  Temp: 99.4 F (37.4 C) 97.9 F (36.6 C)    Physical Exam: Gen: alert, no acute distress, thin HEENT: anicteric sclera CV: RRR Chest: CTA B Abd: flat, nontender, nondistended, ostomy bag intact with loose dark green stool seen, multiple surgical scars Ext: no edema  Lab Results:  Recent Labs  09/04/16 0953 09/04/16 1558  NA 134*  --   K 3.9  --   CL 100*  --   CO2 26  --   GLUCOSE 97  --   BUN 9  --   CREATININE 1.33*  --   CALCIUM 8.8*  --   MG  --  1.9  PHOS  --  4.6    Recent Labs  09/04/16 0953  AST 19  ALT 12*  ALKPHOS 47  BILITOT 1.3*  PROT 7.0  ALBUMIN 3.0*    Recent Labs  09/04/16 0953  WBC 10.5  HGB 12.4*  HCT 37.5*  MCV 94.7  PLT 334   No results for input(s): LABPROT, INR in the last 72 hours.    Assessment/Plan: Crohn's disease with question of a SBO and perirectal mass - clinically improving on IV steroids from an obstructive standpoint. Perirectal mass on CT and will need a flex sig prior to discharge or shortly after discharge. Change to soft diet. Continue IV steroids today and consider transitioning to Prednisone in next 1-2 days. [facility] to f/u tomorrow.   Kidder C. 09/05/2016, 9:11 AM  Pager [phone]  If no answer or after 5 PM call [phone] ID: VIKRANT PRYCE, male   DOB: 06-Dec-1956, 60 y.o.   MRN: [id number]

## 2016-09-06 DIAGNOSIS — K50819 Crohn's disease of both small and large intestine with unspecified complications: Secondary | ICD-10-CM

## 2016-09-06 MED ORDER — SODIUM CHLORIDE 0.9 % IV SOLN
INTRAVENOUS | Status: DC
  Administered 2016-09-06: 17:00:00 via INTRAVENOUS
  Administered 2016-09-08: 950 mL via INTRAVENOUS
  Administered 2016-09-08: 500 mL via INTRAVENOUS

## 2016-09-06 MED ORDER — PREDNISONE 20 MG PO TABS
40.0000 mg | ORAL_TABLET | Freq: Every day | ORAL | Status: DC
  Administered 2016-09-06 – 2016-09-09 (×4): 40 mg via ORAL
  Filled 2016-09-06 (×4): qty 2

## 2016-09-06 NOTE — Progress Notes (Addendum)
Daniel Howard AGT:[id number] DOB: 08/05/1956 DOA: 09/04/2016 PCP: Vic Blackbird, MD  Brief narrative: 25 M with Known h/o ileocolonic crohn's-ileocecostomy + ileostomy-managed at [place], Dr. Sydell Axon in [place] , at baseline has 3-5 bowel movements that are usually formed  Prior rectal stricture 3 cm--not a good candidate for stricture repair, H/o multiple hernia, now on Humira, Iron deficiency anemia and protein energy malnutrition Admitted from the emergency room 08/25/2016 abdominal pain and possible SBO with perirectal mass being possible potentially secondary to Crohn's flare  Also on CT mass along the right side of the rectum measuring 4.3 by 3.1 cm, arising from rectum vs prostate  Assessment/Plan:  1. SBO 2/2 to crohn's/Stricture -improving with supportive care/IV steroids -CT with high-grade partial obstruction proximal to the ileostomy -was on IV Solu-medro, now changed to Prednisone, plan for Flex sig tomorrow per GI -soft diet now, IVF  2. Elevated PSA, ? mass noted on CT scan-Dr.Samtani d/w urology Dr. Louis Meckel who believes can work up as OP  3. AKI /Dehydration with mild hyponatremia  -improving, cut down IVF, on soft diet  4. Iron def anemia-monitor  Consultants:  gen surg  gi  Procedures:    Antibiotics:  none   Subjective  Feels well, no complaints   Objective     Objective: Vitals:   09/05/16 1405 09/05/16 2110 09/06/16 0603 09/06/16 1246  BP: (!) 107/58 107/62 112/69 (!) 103/59  Pulse: 79 61 82 62  Resp: 19  19 19   Temp: 98.3 F (36.8 C) 98.1 F (36.7 C) 97.6 F (36.4 C) 98.1 F (36.7 C)  TempSrc: Oral Oral Oral Oral  SpO2: 100% 100% 99% 99%  Weight:   59 kg (130 lb)   Height:        Intake/Output Summary (Last 24 hours) at 09/06/16 1418 Last data filed at 09/06/16 0713  Gross per 24 hour  Intake                0 ml  Output              650 ml  Net             -650 ml     Exam:  General: AAOx3 Cardiovascular: S1S2/RRR, no m/r/g Respiratory: CTAB Abdomen: soft, non tender, Ostomy +, multiple surgical scars  Skin: no rashes Neuro-grossly intact  Data Reviewed: Basic Metabolic Panel:  Recent Labs Lab 08/30/16 2003 09/04/16 0953 09/04/16 1558 09/05/16 0748  NA 135 134*  --  138  K 4.5 3.9  --  4.4  CL 99* 100*  --  104  CO2 28 26  --  26  GLUCOSE 90 97  --  156*  BUN 11 9  --  9  CREATININE 1.40* 1.33*  --  1.21  CALCIUM 9.4 8.8*  --  9.2  MG  --   --  1.9  --   PHOS  --   --  4.6  --    Liver Function Tests:  Recent Labs Lab 08/30/16 2003 09/04/16 0953 09/05/16 0748  AST 20 19 16   ALT 15* 12* 10*  ALKPHOS 48 47 48  BILITOT 1.1 1.3* 0.7  PROT 7.7 7.0 7.3  ALBUMIN 3.4* 3.0* 2.9*    Recent Labs Lab 08/30/16 2003 09/04/16 0953  LIPASE 22 17   No results for input(s): AMMONIA in the last 168 hours. CBC:  Recent Labs Lab 08/30/16 2003 09/04/16 0953 09/05/16 0748  WBC 6.6  10.5 6.9  HGB 12.6* 12.4* 13.6  HCT 38.1* 37.5* 40.1  MCV 94.5 94.7 94.8  PLT 367 334 383   Cardiac Enzymes: No results for input(s): CKTOTAL, CKMB, CKMBINDEX, TROPONINI in the last 168 hours. BNP: Invalid input(s): POCBNP CBG: No results for input(s): GLUCAP in the last 168 hours.  No results found for this or any previous visit (from the past 240 hour(s)).   Studies:              All Imaging reviewed and is as per above notation   Scheduled Meds: . enoxaparin (LOVENOX) injection  40 mg Subcutaneous Q24H  . feeding supplement  1 Container Oral TID BM  . mirtazapine  15 mg Oral QHS  . predniSONE  40 mg Oral Q breakfast   Continuous Infusions: . dextrose 5 % and 0.9 % NaCl with KCl 20 mEq/L 125 mL/hr at 09/06/16 0023      [name], MD  [facility] Pager [phone] 09/06/2016, 2:18 PM    LOS: 2 days

## 2016-09-06 NOTE — Progress Notes (Signed)
[facility] Gastroenterology Progress Note  Daniel Howard 60 y.o. 30-Nov-1956  C/C : Crohn's Flare   Subjective: Patient is doing better. Tolerating soft diet. Denied any abdominal pain. Denied fever.   Objective: Vital signs in last 24 hours: Vitals:   09/05/16 2110 09/06/16 0603  BP: 107/62 112/69  Pulse: 61 82  Resp:  19  Temp: 98.1 F (36.7 C) 97.6 F (36.4 C)    ROS : Denied abdominal pain. Denied nausea or vomiting. Denied blood in the stomach. Denied chest pain or difficulty breathing. Physical Exam:  General:  Alert, cooperative, no distress, appears stated age  Head:  Normocephalic, without obvious abnormality, atraumatic  Eyes:  , EOM's intact,   Lungs:   Clear to auscultation bilaterally, respirations unlabored  Heart:  Regular rate and rhythm, S1, S2 normal  Abdomen:   Soft, non-tender, Ostomy with light  green stool . Scar marks from previous surgery   Extremities: Extremities normal, atraumatic, no  edema       Lab Results:  Recent Labs  09/04/16 0953 09/04/16 1558 09/05/16 0748  NA 134*  --  138  K 3.9  --  4.4  CL 100*  --  104  CO2 26  --  26  GLUCOSE 97  --  156*  BUN 9  --  9  CREATININE 1.33*  --  1.21  CALCIUM 8.8*  --  9.2  MG  --  1.9  --   PHOS  --  4.6  --     Recent Labs  09/04/16 0953 09/05/16 0748  AST 19 16  ALT 12* 10*  ALKPHOS 47 48  BILITOT 1.3* 0.7  PROT 7.0 7.3  ALBUMIN 3.0* 2.9*    Recent Labs  09/04/16 0953 09/05/16 0748  WBC 10.5 6.9  HGB 12.4* 13.6  HCT 37.5* 40.1  MCV 94.7 94.8  PLT 334 383   No results for input(s): LABPROT, INR in the last 72 hours.    Assessment/Plan: Crohn's disease flare. Improving Abnormal CT scan showing mass along the right side of the rectum measuring 4.3 X 3.1 cm. This could be arising from the rectum or prostate per CT   Recommendation ------------------------ - Continue current diet. - possible  flexible sigmoidoscopy tomorrow  Change IV steroids to oral  prednisone 40  mg once a day. Recommend four-week taper over discharge. Patient was advised to see his primary gastroenterologist in 2 weeks after discharge. GI will follow    Daniel Howard 09/06/2016, 8:24 AM  Pager [phone]  If no answer or after 5 PM call [phone]

## 2016-09-06 NOTE — Progress Notes (Signed)
Subjective: No complaints  Objective: Vital signs in last 24 hours: Temp:  [97.6 F (36.4 C)-98.3 F (36.8 C)] 97.6 F (36.4 C) (09/16 0603) Pulse Rate:  [61-82] 82 (09/16 0603) Resp:  [19] 19 (09/16 0603) BP: (107-112)/(58-69) 112/69 (09/16 0603) SpO2:  [99 %-100 %] 99 % (09/16 0603) Weight:  [59 kg (130 lb)] 59 kg (130 lb) (09/16 0603) Last BM Date: 09/05/16  Intake/Output from previous day: 09/15 0701 - 09/16 0700 In: 864.6 [I.V.:864.6] Out: 200 [Urine:200] Intake/Output this shift: Total I/O In: -  Out: 450 [Stool:450]  Resp: clear to auscultation bilaterally Cardio: regular rate and rhythm GI: soft, nontender. ostomy pink and productive  Lab Results:   Recent Labs  09/04/16 0953 09/05/16 0748  WBC 10.5 6.9  HGB 12.4* 13.6  HCT 37.5* 40.1  PLT 334 383   BMET  Recent Labs  09/04/16 0953 09/05/16 0748  NA 134* 138  K 3.9 4.4  CL 100* 104  CO2 26 26  GLUCOSE 97 156*  BUN 9 9  CREATININE 1.33* 1.21  CALCIUM 8.8* 9.2   PT/INR No results for input(Howard): LABPROT, INR in the last 72 hours. ABG No results for input(Howard): PHART, HCO3 in the last 72 hours.  Invalid input(Howard): PCO2, PO2  Studies/Results: Ct Abdomen Pelvis W Contrast  Result Date: 09/04/2016 CLINICAL DATA:  Abdominal pain underlying ileostomy for 2 days. No ileostomy output. Nausea. Crohn' Howard disease. EXAM: CT ABDOMEN AND PELVIS WITH CONTRAST TECHNIQUE: Multidetector CT imaging of the abdomen and pelvis was performed using the standard protocol following bolus administration of intravenous contrast. CONTRAST:  13m ISOVUE-300 IOPAMIDOL (ISOVUE-300) INJECTION 61% COMPARISON:  Multiple exams, including 10/10/2014 and MRI from 10/23/2014 FINDINGS: Lower chest: Mild scarring in the posterior basal segment left lower lobe. Mild dependent subsegmental atelectasis in the lower lobes. Hepatobiliary: Indistinct extrahepatic biliary tree. Otherwise unremarkable. Pancreas: Dilated dorsal pancreatic duct  measuring up to 5 mm in diameter, but stable from 2015. Spleen: Stable small left inferior splenic hemangioma, 1.2 cm in long axis on image 27/2. Adrenals/Urinary Tract: 4 mm hypodense lesion anteriorly in the left mid kidney, likely a cyst but technically nonspecific. Adrenal glands unremarkable. Stomach/Bowel: Most of the stomach body is collapsed. Poor definition of the descending duodenum. Accentuated mucosal enhancement in a 10 cm loop of small bowel extending to the ostomy. Just proximal to this, a focally dilated loop of small bowel measures 6.3 cm in diameter on image 50/2, and there other loops of dilated small bowel measuring up to about 4 cm. There is segments of small bowel abnormal mucosal enhancement in scattered air-fluid levels. Moreover, on image 463series 2 there is a sSolicitorhernia believed to be involving small bowel and extending through the transverse abdominis muscle but not the oblique musculature. However, I am doubtful that this small Richter hernia is a site of obstruction. 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 There is a new masslike density along the right lateral rectal margin and right posterior margin of the prostate gland, measuring 3.1 by 4.3 cm on image 73/2 and extending in the perirectal space nearly to the operator internus muscle. There is increased stranding in the perirectal space and probably new inflammatory or neoplastic findings in the left lower perirectal space  and along the external sphincter on the left. Appearance on images 81-88 of series 2 raises the possibility of a perianal fistula extending to the intergluteal fold. Vascular/Lymphatic: Mesenteric nodes in the right abdomen measure up to 1.2 cm in short  axis, some with prior. Reproductive: Mildly asymmetry of the right seminal vesicle. Possibly abnormal enhancement in the peripheral zone of the right prostate gland for example on image 74/2. Other: Diffuse mesenteric edema. Trace ascites along the right paracolic gutter. Paucity of intra-abdominal adipose tissue. Musculoskeletal: Fused sacroiliac joints. Markedly dilated inferior gluteal vein varicosities, image 80/5 on the right and image 84/5 on the left - this can be a cause for sciatica due to impingement on the sciatic nerves. IMPRESSION: 1. 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 There is abnormal mucosal enhancement in multiple segments of small bowel more characteristic of Crohn'Howard disease, with intermittent dilated in nondilated segments. The largest dilated segment of small bowel is in the right abdomen about 10 cm proximal to the ostomy, and measures 6.3 cm in diameter. There is a somewhat abrupt angulation between the dilated segment of right-sided abdominal small bowel and the nondilated segment leading to the ostomy which has accentuated mucosal enhancement. The possibility of a stricture around image 53 of series 2 is not totally excluded. 2. There is an apparent mass along the right side of the rectum measuring 4.3 by 3.1 cm, extending through the external sphincter and abutting the operator internus. This mass also extends along the right posterolateral margin of the prostate gland where there some abnormal enhancement in the peripheral zone. This could be arising from the rectum or prostate. I am suspicious for cancer. 3. Left perirectal inflammatory findings with a possible left perianal fistula extending to the left side of the intergluteal fold. 4. Prominently dilated  inferior gluteal veins, this can cause sciatica. 5. 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 6. Stably dilated dorsal pancreatic duct at 5 mm in diameter, not changed from 2015. 7. Diffuse mesenteric edema with paucity of intra-abdominal adipose tissue. 8. Few sacroiliac joints. Electronically Signed   By: Van Clines M.D.   On: 09/04/2016 11:31    Anti-infectives: Anti-infectives    None      Assessment/Plan: Howard/p * No surgery found * Advance diet  Crohn'Howard flare. IV steroid taper. GI following Rectal mass. Possible flex sig tomorrow Will follow Obstruction appears to have resolved  LOS: 2 days    Daniel Howard,Daniel Howard 09/06/2016

## 2016-09-07 LAB — CBC
HCT: 35.6 % — ABNORMAL LOW (ref 39.0–52.0)
Hemoglobin: 11.3 g/dL — ABNORMAL LOW (ref 13.0–17.0)
MCH: 30.8 pg (ref 26.0–34.0)
MCHC: 31.7 g/dL (ref 30.0–36.0)
MCV: 97 fL (ref 78.0–100.0)
Platelets: 338 10*3/uL (ref 150–400)
RBC: 3.67 MIL/uL — ABNORMAL LOW (ref 4.22–5.81)
RDW: 13.4 % (ref 11.5–15.5)
WBC: 18.5 10*3/uL — ABNORMAL HIGH (ref 4.0–10.5)

## 2016-09-07 LAB — BASIC METABOLIC PANEL
ANION GAP: 7 (ref 5–15)
BUN: 9 mg/dL (ref 6–20)
CALCIUM: 8.9 mg/dL (ref 8.9–10.3)
CO2: 26 mmol/L (ref 22–32)
Chloride: 105 mmol/L (ref 101–111)
Creatinine, Ser: 1.14 mg/dL (ref 0.61–1.24)
GFR calc Af Amer: >60 mL/min (ref >60)
GLUCOSE: 96 mg/dL (ref 65–99)
Potassium: 4.7 mmol/L (ref 3.5–5.1)
Sodium: 138 mmol/L (ref 135–145)

## 2016-09-07 NOTE — Progress Notes (Signed)
  Subjective: No complaints  Objective: Vital signs in last 24 hours: Temp:  [97.9 F (36.6 C)-98.7 F (37.1 C)] 97.9 F (36.6 C) (09/17 0524) Pulse Rate:  [56-69] 56 (09/17 0524) Resp:  [18-19] 18 (09/17 0524) BP: (101-115)/(57-59) 101/57 (09/17 0524) SpO2:  [98 %-99 %] 98 % (09/17 0524) Last BM Date: 09/07/16  Intake/Output from previous day: 09/16 0701 - 09/17 0700 In: 580 [P.O.:580] Out: 1100 [Urine:650; Stool:450] Intake/Output this shift: No intake/output data recorded.  Resp: clear to auscultation bilaterally Cardio: regular rate and rhythm GI: soft, nontender. ostomy pink and productive  Lab Results:   Recent Labs  09/05/16 0748 09/07/16 0558  WBC 6.9 18.5*  HGB 13.6 11.3*  HCT 40.1 35.6*  PLT 383 338   BMET  Recent Labs  09/05/16 0748 09/07/16 0558  NA 138 138  K 4.4 4.7  CL 104 105  CO2 26 26  GLUCOSE 156* 96  BUN 9 9  CREATININE 1.21 1.14  CALCIUM 9.2 8.9   PT/INR No results for input(s): LABPROT, INR in the last 72 hours. ABG No results for input(s): PHART, HCO3 in the last 72 hours.  Invalid input(s): PCO2, PO2  Studies/Results: No results found.  Anti-infectives: Anti-infectives    None      Assessment/Plan: s/p Procedure(s): FLEXIBLE SIGMOIDOSCOPY ([name]) Advance diet  Crohn's flare. IV steroid taper. GI following Rectal mass. flex sig today Will follow Obstruction appears to have resolved  LOS: 3 days    Taiden Raybourn C. 9/53/2023

## 2016-09-07 NOTE — Progress Notes (Addendum)
[facility] Gastroenterology Progress Note  Daniel Howard 60 y.o. 1956-09-03  C/C : Crohn's Flare   Subjective: Patient is doing better. Tolerating soft diet yesterday. Currently nothing by mouth past midnight for flexible sigmoidoscopy. Denied any abdominal pain. Denied fever.   Objective: Vital signs in last 24 hours: Vitals:   09/06/16 2150 09/07/16 0524  BP: (!) 112/57 (!) 101/57  Pulse: 63 (!) 56  Resp: 18 18  Temp: 98 F (36.7 C) 97.9 F (36.6 C)    ROS : Denied abdominal pain. Denied nausea or vomiting. Denied blood in the stomach. Denied chest pain or difficulty breathing. Physical Exam:  General:  Alert, cooperative, no distress, appears stated age  Head:  Normocephalic, without obvious abnormality, atraumatic  Eyes:  , EOM's intact,   Lungs:   Clear to auscultation bilaterally, respirations unlabored  Heart:  Regular rate and rhythm, S1, S2 normal  Abdomen:   Soft, non-tender, Ostomy with light  green stool . Scar marks from previous surgery   Extremities: Extremities normal, atraumatic, no  edema       Lab Results:  Recent Labs  09/04/16 1558 09/05/16 0748 09/07/16 0558  NA  --  138 138  K  --  4.4 4.7  CL  --  104 105  CO2  --  26 26  GLUCOSE  --  156* 96  BUN  --  9 9  CREATININE  --  1.21 1.14  CALCIUM  --  9.2 8.9  MG 1.9  --   --   PHOS 4.6  --   --     Recent Labs  09/04/16 0953 09/05/16 0748  AST 19 16  ALT 12* 10*  ALKPHOS 47 48  BILITOT 1.3* 0.7  PROT 7.0 7.3  ALBUMIN 3.0* 2.9*    Recent Labs  09/05/16 0748 09/07/16 0558  WBC 6.9 18.5*  HGB 13.6 11.3*  HCT 40.1 35.6*  MCV 94.8 97.0  PLT 383 338   No results for input(s): LABPROT, INR in the last 72 hours.    Assessment/Plan: Crohn's disease flare. Improving Abnormal CT scan showing mass along the right side of the rectum measuring 4.3 X 3.1 cm. This could be arising from the rectum or prostate per CT   Recommendation ------------------------ - Flexible sigmoidoscopy  today for evaluation of abnormal CT scan - Continue oral  prednisone 40 mg once a day. Recommend four-week taper over discharge. Patient was advised to see his primary gastroenterologist Dr. Manus Rudd in 2 weeks after discharge. GI will follow   Addendum : 1:11 pm - Unfortunately not able to do flexible sigmoidoscopy today becauseof urgent ERCP and other urgent endoscopic procedures. - Full liquid diet today - Nothing by mouth past midnight. Flexible sigmoidoscopy in  a.m.   Otis Brace MD, FACP  09/07/2016, 9:42 AM  Pager [phone]  If no answer or after 5 PM call [phone]

## 2016-09-07 NOTE — Progress Notes (Signed)
[place] OXB:[id number] DOB: 1956/04/24 DOA: 09/04/2016 PCP: Vic Blackbird, MD  Brief narrative: 78 M with Known h/o ileocolonic crohn's-ileocecostomy + ileostomy-managed at [place], Dr. Sydell Axon in [place] , at baseline has 3-5 bowel movements that are usually formed  Prior rectal stricture 3 cm--not a good candidate for stricture repair, H/o multiple hernia, now on Humira, Iron deficiency anemia and protein energy malnutrition Admitted from the emergency room 08/25/2016 abdominal pain and possible SBO with perirectal mass being possible potentially secondary to Crohn's flare  Also on CT mass along the right side of the rectum measuring 4.3 by 3.1 cm, arising from rectum vs prostate  Assessment/Plan:  1. SBO 2/2 to crohn's/Stricture -improving with supportive care/Steroids -CT with high-grade partial obstruction proximal to the ileostomy -was on IV Solu-medro, now changed to Prednisone, plan for Flex sig today per GI -has been tolerating soft diet  2. Elevated PSA, ? mass noted on CT scan-Dr.Samtani d/w urology Dr. Louis Meckel who believes can work up as OP  3. AKI /Dehydration with mild hyponatremia  -improving, cut down IVF, on soft diet  4. Iron def anemia-monitor  DVT proph: stop lovenox for precedure Full Code Communication: no family at bedside Dispo: Home when improved and GI workup completed  Consultants:  gen surg  gi  Procedures:    Antibiotics:  none   Subjective  Had severe pain after enema today, better now   Objective     Objective: Vitals:   09/06/16 1246 09/06/16 1421 09/06/16 2150 09/07/16 0524  BP: (!) 103/59 (!) 115/58 (!) 112/57 (!) 101/57  Pulse: 62 69 63 (!) 56  Resp: 19 18 18 18   Temp: 98.1 F (36.7 C) 98.7 F (37.1 C) 98 F (36.7 C) 97.9 F (36.6 C)  TempSrc: Oral     SpO2: 99% 99% 99% 98%  Weight:      Height:        Intake/Output Summary (Last 24 hours) at 09/07/16  1225 Last data filed at 09/07/16 0522  Gross per 24 hour  Intake              280 ml  Output              650 ml  Net             -370 ml    Exam:  General: AAOx3 Cardiovascular: S1S2/RRR, no m/r/g Respiratory: CTAB Abdomen: soft, non tender, Ostomy +, multiple surgical scars  Skin: no rashes Neuro-grossly intact  Data Reviewed: Basic Metabolic Panel:  Recent Labs Lab 09/04/16 0953 09/04/16 1558 09/05/16 0748 09/07/16 0558  NA 134*  --  138 138  K 3.9  --  4.4 4.7  CL 100*  --  104 105  CO2 26  --  26 26  GLUCOSE 97  --  156* 96  BUN 9  --  9 9  CREATININE 1.33*  --  1.21 1.14  CALCIUM 8.8*  --  9.2 8.9  MG  --  1.9  --   --   PHOS  --  4.6  --   --    Liver Function Tests:  Recent Labs Lab 09/04/16 0953 09/05/16 0748  AST 19 16  ALT 12* 10*  ALKPHOS 47 48  BILITOT 1.3* 0.7  PROT 7.0 7.3  ALBUMIN 3.0* 2.9*    Recent Labs Lab 09/04/16 0953  LIPASE 17   No results for input(s): AMMONIA in the last 168 hours. CBC:  Recent Labs Lab 09/04/16 0953 09/05/16 0748 09/07/16 0558  WBC 10.5 6.9 18.5*  HGB 12.4* 13.6 11.3*  HCT 37.5* 40.1 35.6*  MCV 94.7 94.8 97.0  PLT 334 383 338   Cardiac Enzymes: No results for input(s): CKTOTAL, CKMB, CKMBINDEX, TROPONINI in the last 168 hours. BNP: Invalid input(s): POCBNP CBG: No results for input(s): GLUCAP in the last 168 hours.  No results found for this or any previous visit (from the past 240 hour(s)).   Studies:              All Imaging reviewed and is as per above notation   Scheduled Meds: . enoxaparin (LOVENOX) injection  40 mg Subcutaneous Q24H  . feeding supplement  1 Container Oral TID BM  . mirtazapine  15 mg Oral QHS  . predniSONE  40 mg Oral Q breakfast   Continuous Infusions: . sodium chloride 50 mL/hr at 09/06/16 1719      [name], MD  [facility] Pager [phone] 09/07/2016, 12:25 PM    LOS: 3 days

## 2016-09-08 ENCOUNTER — Encounter (HOSPITAL_COMMUNITY)
Admission: EM | Disposition: A | Discharge status: Home / Self Care | Payer: Self-pay | Source: Home / Self Care | Attending: Internal Medicine

## 2016-09-08 ENCOUNTER — Encounter (HOSPITAL_COMMUNITY): Payer: Self-pay

## 2016-09-08 HISTORY — PX: FLEXIBLE SIGMOIDOSCOPY: SHX5431

## 2016-09-08 LAB — BASIC METABOLIC PANEL
ANION GAP: 5 (ref 5–15)
BUN: 7 mg/dL (ref 6–20)
CHLORIDE: 104 mmol/L (ref 101–111)
CO2: 29 mmol/L (ref 22–32)
CREATININE: 1.14 mg/dL (ref 0.61–1.24)
Calcium: 8.8 mg/dL — ABNORMAL LOW (ref 8.9–10.3)
GFR calc non Af Amer: >60 mL/min (ref >60)
Glucose, Bld: 91 mg/dL (ref 65–99)
POTASSIUM: 4.2 mmol/L (ref 3.5–5.1)
Sodium: 138 mmol/L (ref 135–145)

## 2016-09-08 LAB — CBC
HEMATOCRIT: 37 % — AB (ref 39.0–52.0)
HEMOGLOBIN: 12 g/dL — AB (ref 13.0–17.0)
MCH: 31 pg (ref 26.0–34.0)
MCHC: 32.4 g/dL (ref 30.0–36.0)
MCV: 95.6 fL (ref 78.0–100.0)
PLATELETS: 351 10*3/uL (ref 150–400)
RBC: 3.87 MIL/uL — AB (ref 4.22–5.81)
RDW: 13.2 % (ref 11.5–15.5)
WBC: 14.9 10*3/uL — ABNORMAL HIGH (ref 4.0–10.5)

## 2016-09-08 SURGERY — SIGMOIDOSCOPY, FLEXIBLE
Anesthesia: Moderate Sedation

## 2016-09-08 MED ORDER — FENTANYL CITRATE (PF) 100 MCG/2ML IJ SOLN
INTRAMUSCULAR | Status: AC
  Filled 2016-09-08: qty 2

## 2016-09-08 MED ORDER — FENTANYL CITRATE (PF) 100 MCG/2ML IJ SOLN
INTRAMUSCULAR | Status: DC | PRN
  Administered 2016-09-08 (×2): 25 ug via INTRAVENOUS

## 2016-09-08 MED ORDER — MIDAZOLAM HCL 10 MG/2ML IJ SOLN
INTRAMUSCULAR | Status: DC | PRN
Start: 2016-09-08 — End: 2016-09-08
  Administered 2016-09-08 (×3): 2 mg via INTRAVENOUS

## 2016-09-08 MED ORDER — MIDAZOLAM HCL 5 MG/ML IJ SOLN
INTRAMUSCULAR | Status: AC
  Filled 2016-09-08: qty 2

## 2016-09-08 NOTE — Progress Notes (Signed)
Daniel Howard GLO:[id number] DOB: January 03, 1956 DOA: 09/04/2016 PCP: Vic Blackbird, MD  Brief narrative: 31 M with Known h/o ileocolonic crohn's-ileocecostomy + ileostomy-managed at [place] [place], Dr. Sydell Axon in [place] , at baseline has 3-5 bowel movements that are usually formed  Prior rectal stricture 3 cm--not a good candidate for stricture repair, H/o multiple hernia, now on Humira, Iron deficiency anemia and protein energy malnutrition Admitted from the emergency room 08/25/2016 abdominal pain and possible SBO with perirectal mass being possible potentially secondary to Crohn's flare  Also on CT mass along the right side of the rectum measuring 4.3 by 3.1 cm, arising from rectum vs prostate  Assessment/Plan:  1. SBO 2/2 to crohn's/Stricture -improving with supportive care/Steroids -CT with high-grade partial obstruction proximal to the ileostomy -was on IV Solu-medro, now changed to Prednisone,  -Flex sig-anal stricture, rectal pouch was friable,and scope only advanced up about 7 cm at which point was the end of the pouch -resume soft diet  2. Elevated PSA, ? mass noted on CT scan-Dr.Samtani d/w urology Dr. Louis Meckel who believes can work up as OP  3. AKI /Dehydration with mild hyponatremia  -improving, cut down IVF, on soft diet  4. Iron def anemia-monitor  DVT proph: stop lovenox for precedure Full Code Communication: no family at bedside Dispo: Home later today or in am  Consultants:  gen surg  gi  Procedures:    Antibiotics:  none   Subjective  Just back from Flex sigmoidoscopy   Objective     Objective: Vitals:   09/08/16 0850 09/08/16 0858 09/08/16 0946 09/08/16 1400  BP: (!) 116/51 (!) 110/59 107/65 125/68  Pulse: (!) 44 (!) 46 (!) 43 81  Resp: 17 15 18 20   Temp:  97.6 F (36.4 C) 97.7 F (36.5 C) 98.9 F (37.2 C)  TempSrc:  Oral Oral Oral  SpO2: 100% 98% 100% 99%  Weight:    63.5 kg (140 lb)    Height:        Intake/Output Summary (Last 24 hours) at 09/08/16 1539 Last data filed at 09/08/16 1300  Gross per 24 hour  Intake              240 ml  Output              675 ml  Net             -435 ml    Exam:  General: AAOx3 Cardiovascular: S1S2/RRR, no m/r/g Respiratory: CTAB Abdomen: soft, non tender, Ostomy +, multiple surgical scars  Skin: no rashes Neuro-grossly intact  Data Reviewed: Basic Metabolic Panel:  Recent Labs Lab 09/04/16 0953 09/04/16 1558 09/05/16 0748 09/07/16 0558 09/08/16 1006  NA 134*  --  138 138 138  K 3.9  --  4.4 4.7 4.2  CL 100*  --  104 105 104  CO2 26  --  26 26 29   GLUCOSE 97  --  156* 96 91  BUN 9  --  9 9 7   CREATININE 1.33*  --  1.21 1.14 1.14  CALCIUM 8.8*  --  9.2 8.9 8.8*  MG  --  1.9  --   --   --   PHOS  --  4.6  --   --   --    Liver Function Tests:  Recent Labs Lab 09/04/16 0953 09/05/16 0748  AST 19 16  ALT 12* 10*  ALKPHOS 47 48  BILITOT 1.3* 0.7  PROT 7.0 7.3  ALBUMIN 3.0* 2.9*    Recent Labs Lab 09/04/16 0953  LIPASE 17   No results for input(s): AMMONIA in the last 168 hours. CBC:  Recent Labs Lab 09/04/16 0953 09/05/16 0748 09/07/16 0558 09/08/16 1006  WBC 10.5 6.9 18.5* 14.9*  HGB 12.4* 13.6 11.3* 12.0*  HCT 37.5* 40.1 35.6* 37.0*  MCV 94.7 94.8 97.0 95.6  PLT 334 383 338 351   Cardiac Enzymes: No results for input(s): CKTOTAL, CKMB, CKMBINDEX, TROPONINI in the last 168 hours. BNP: Invalid input(s): POCBNP CBG: No results for input(s): GLUCAP in the last 168 hours.  No results found for this or any previous visit (from the past 240 hour(s)).   Studies:              All Imaging reviewed and is as per above notation   Scheduled Meds: . feeding supplement  1 Container Oral TID BM  . mirtazapine  15 mg Oral QHS  . predniSONE  40 mg Oral Q breakfast   Continuous Infusions: . sodium chloride 950 mL (09/08/16 1109)      [name], MD  [facility] Pager  [phone] 09/08/2016, 3:39 PM    LOS: 4 days

## 2016-09-08 NOTE — Care Management Note (Signed)
Case Management Note  Patient Details  Name: Daniel Howard MRN: [id number] Date of Birth: 07-05-1956  Subjective/Objective:     From home with wife admitted with abd pain,h/o ileocolonic crohn's-ileocecostomy + ileostomy. Independent with ADL's, no DME usage PTA.            PCP; Vic Blackbird  Action/Plan: Plan is to d/c to home today.  Expected Discharge Date:   9/18/207            Expected Discharge Plan:  Home/Self Care  In-House Referral:     Discharge planning Services  CM Consult  Status of Service:  Completed, signed off  If discussed at [place] of Stay Meetings, dates discussed:    Additional Comments:  Sharin Mons, RN 09/08/2016, 4:09 PM

## 2016-09-08 NOTE — Op Note (Signed)
[facility] Patient Name: Daniel Howard Procedure Date : 09/08/2016 MRN: [id number] Attending MD: Wonda Horner , MD Date of Birth: 20-Mar-1956 CSN: [id number] Age: 60 Admit Type: Inpatient Procedure:                Flexible Sigmoidoscopy Indications:              Abnormal CT of the GI tract Providers:                Wonda Horner, MD, Cleda Daub, RN, [facility], Technician Referring MD:              Medicines:                Fentanyl 50 micrograms IV, Midazolam 6 mg IV Complications:            No immediate complications. Estimated Blood Loss:     Estimated blood loss: none. Procedure:                Pre-Anesthesia Assessment:                           - Prior to the procedure, a History and Physical                            was performed, and patient medications and                            allergies were reviewed. The patient's tolerance of                            previous anesthesia was also reviewed. The risks                            and benefits of the procedure and the sedation                            options and risks were discussed with the patient.                            All questions were answered, and informed consent                            was obtained. Prior Anticoagulants: The patient has                            taken no previous anticoagulant or antiplatelet                            agents. ASA Grade Assessment: II - A patient with                            mild systemic disease. After reviewing the risks  and benefits, the patient was deemed in                            satisfactory condition to undergo the procedure.                           After obtaining informed consent, the scope was                            passed under direct vision. The EC-[id number] ([id number])                            scope was introduced through the anus and advanced          to the the rectum. The EC-[id number] ([id number]) scope                            was introduced through the and advanced to the. The                            flexible sigmoidoscopy was accomplished without                            difficulty. The patient tolerated the procedure                            well. The quality of the bowel preparation was                            good,view no prep. Scope In: Scope Out: Findings:      The digital rectal exam findings include mild anal stricture.      There was evidence of a prior surgery in the rectum. There was a rectal       pouch. It was friable,and scope only advanced up about 7 cm at which       point was the end of the pouch. There was no mass,or tumor. Impression:               - Anal stricture (mild) found on digital rectal                            exam.                           - Surgical rectal pouch. No mass or tumor.                           - No specimens collected. Recommendation:           - Resume previous diet. 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 All rights reserved. The codes documented in this report are preliminary and upon coder review may  be revised to meet current compliance requirements. Wonda Horner, MD 09/08/2016 9:07:04 AM This report has been signed electronically. Number of Addenda: 0

## 2016-09-08 NOTE — Progress Notes (Signed)
Day of Surgery  Subjective: He looks great after sigmoidoscopy, ostomy working well.  No discomfort.    Objective: Vital signs in last 24 hours: Temp:  [97.2 F (36.2 C)-98.7 F (37.1 C)] 97.6 F (36.4 C) (09/18 0858) Pulse Rate:  [44-73] 46 (09/18 0858) Resp:  [13-23] 15 (09/18 0858) BP: (107-126)/(51-73) 110/59 (09/18 0858) SpO2:  [96 %-100 %] 98 % (09/18 0858) Weight:  [62.8 kg (138 lb 7.2 oz)] 62.8 kg (138 lb 7.2 oz) (09/18 0535) Last BM Date: 09/07/16 Urine 750 only I/O entry Afebrile, VSS No labs Intake/Output from previous day: 09/17 0701 - 09/18 0700 In: -  Out: 750 [Urine:750] Intake/Output this shift: No intake/output data recorded.  General appearance: alert, cooperative and no distress GI: soft, non-tender; bowel sounds normal; no masses,  no organomegaly and ostomy working well  Lab Results:   Recent Labs  09/07/16 0558  WBC 18.5*  HGB 11.3*  HCT 35.6*  PLT 338    BMET  Recent Labs  09/07/16 0558  NA 138  K 4.7  CL 105  CO2 26  GLUCOSE 96  BUN 9  CREATININE 1.14  CALCIUM 8.9   PT/INR No results for input(s): LABPROT, INR in the last 72 hours.   Recent Labs Lab 09/04/16 0953 09/05/16 0748  AST 19 16  ALT 12* 10*  ALKPHOS 47 48  BILITOT 1.3* 0.7  PROT 7.0 7.3  ALBUMIN 3.0* 2.9*     Lipase     Component Value Date/Time   LIPASE 17 09/04/2016 0953     Studies/Results: No results found.  Medications: . feeding supplement  1 Container Oral TID BM  . mirtazapine  15 mg Oral QHS  . predniSONE  40 mg Oral Q breakfast   . sodium chloride 500 mL (09/08/16 0742)   Assessment/Plan SBO Crohn's flare. IV steroid taper. GI following Richter and Spigelian hernias Rectal mass FEN: No diet ID:No antibiotics DVT:  SCD   Plan: Flex sig this AM Dr. 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  Surgical rectal pouch. No mass or tumor. I will restart him on a soft diet. Review with Dr. Grandville Silos.   LOS: 4 days     Daniel Howard 09/08/2016 [phone]

## 2016-09-09 LAB — CEA: CEA: 2.2 ng/mL (ref 0.0–4.7)

## 2016-09-09 MED ORDER — PREDNISONE 20 MG PO TABS: 40.0000 mg | ORAL_TABLET | Freq: Every day | ORAL | 0 refills | Status: DC

## 2016-09-09 NOTE — Progress Notes (Signed)
Patient currently inpatient at [facility] for SBO and mass either arising from rectum or prostate. Flex sig did not show mass. CPR and sed rate high. psa high. Will continue to follow inpatient information and arrange for follow up as needed.

## 2016-09-09 NOTE — Care Management Note (Signed)
Case Management Note  Patient Details  Name: Daniel Howard MRN: [id number] Date of Birth: [birth year]  Subjective/Objective:                    Action/Plan: Discharge to home today.  Expected Discharge Date:    09/09/2016           Expected Discharge Plan:  Home/Self Care  In-House Referral:     Discharge planning Services  CM Consult  Status of Service:  Completed, signed off  If discussed at [place] of Stay Meetings, dates discussed:    Additional Comments:  Sharin Mons, RN 09/09/2016, 10:29 AM

## 2016-09-09 NOTE — Care Management Important Message (Signed)
Important Message  Patient Details  Name: Daniel Howard MRN: [id number] Date of Birth: 1956/06/11   Medicare Important Message Given:  Yes    Nathen May 09/09/2016, 9:50 AM

## 2016-09-09 NOTE — Progress Notes (Signed)
Patient was discharged home by MD order; discharged instructions  review and give to patient with care notes and prescription; IV DIC; skin intact; patient will be escorted to the car by a volunteer via wheelchair.

## 2016-09-09 NOTE — Progress Notes (Signed)
[facility] Progress Note  1 Day Post-Op  Subjective: Sitting up in bed. NAE overnight. Denies abdominal pain. Tolerating diet. Denies nausea and vomiting.  Objective: Vital signs in last 24 hours: Temp:  [98 F (36.7 C)-99.6 F (37.6 C)] 98 F (36.7 C) (09/19 0557) Pulse Rate:  [62-81] 62 (09/19 0557) Resp:  [16-20] 16 (09/19 0557) BP: (111-125)/(59-72) 116/72 (09/19 0557) SpO2:  [99 %-100 %] 100 % (09/19 0557) Weight:  [61.2 kg (134 lb 14.7 oz)-63.5 kg (140 lb)] 61.2 kg (134 lb 14.7 oz) (09/19 0557) Last BM Date: 09/08/16  Intake/Output from previous day: 09/18 0701 - 09/19 0700 In: 980 [P.O.:698; I.V.:282] Out: 1025 [Urine:1025] Intake/Output this shift: Total I/O In: 120 [P.O.:120] Out: 200 [Urine:200]  PE: General appearance: NAD, alert, cooperative GI: soft, non-tender; bowel sounds normal; no masses,  no organomegaly; stoma viable and ostomy working well  Lab Results:   Recent Labs  09/07/16 0558 09/08/16 1006  WBC 18.5* 14.9*  HGB 11.3* 12.0*  HCT 35.6* 37.0*  PLT 338 351   BMET  Recent Labs  09/07/16 0558 09/08/16 1006  NA 138 138  K 4.7 4.2  CL 105 104  CO2 26 29  GLUCOSE 96 91  BUN 9 7  CREATININE 1.14 1.14  CALCIUM 8.9 8.8*   PT/INR No results for input(s): LABPROT, INR in the last 72 hours. CMP     Component Value Date/Time   NA 138 09/08/2016 1006   K 4.2 09/08/2016 1006   CL 104 09/08/2016 1006   CO2 29 09/08/2016 1006   GLUCOSE 91 09/08/2016 1006   BUN 7 09/08/2016 1006   CREATININE 1.14 09/08/2016 1006   CREATININE 1.38 (H) 08/29/2016 1016   CALCIUM 8.8 (L) 09/08/2016 1006   PROT 7.3 09/05/2016 0748   ALBUMIN 2.9 (L) 09/05/2016 0748   AST 16 09/05/2016 0748   ALT 10 (L) 09/05/2016 0748   ALKPHOS 48 09/05/2016 0748   BILITOT 0.7 09/05/2016 0748   GFRNONAA >60 09/08/2016 1006   GFRNONAA 65 10/29/2015 0954   GFRAA >60 09/08/2016 1006   GFRAA 75 10/29/2015 0954   Assessment/Plan SBO Crohn's flare - IV  steroid taper. 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 Patient is tolerating diet, pain controlled, ostomy functioning, and GI is managing the patient's Crohn's disease. There are no acute surgical needs at this time. General surgery will sign off for now but will be available as needed. Thank you.    LOS: 5 days    Jill Alexanders , [facility] 09/09/2016, 9:52 AM Pager: [phone] Consults: [phone] Mon-Fri 7:00 am-4:30 pm Sat-Sun 7:00 am-11:30 am

## 2016-09-15 NOTE — Discharge Summary (Signed)
Physician Discharge Summary  Daniel Howard RWE:[id number] DOB: 1956/11/14 DOA: 09/04/2016  PCP: Vic Blackbird, MD  Admit date: 09/04/2016 Discharge date: 09/09/2016  Time spent: 35 minutes  Recommendations for Outpatient Follow-up:  Dr.Rourk Gi on 10/6 Urology Dr.Herrick in 3weeks-elevated PSA  Discharge Diagnoses:  Principal Problem:   Crohn's disease of both small and large intestine with complication ([place]) Active Problems:   Anemia   SBO (small bowel obstruction) ([facility])   Protein-calorie malnutrition, severe ([place])   Dehydration with hyponatremia   Rectal mass   Perirectal fistula   Exacerbation of Crohn's disease ([place])   Discharge Condition: stable  Diet recommendation: soft bland diet  Filed Weights   09/08/16 0535 09/08/16 1400 09/09/16 0557  Weight: 62.8 kg (138 lb 7.2 oz) 63.5 kg (140 lb) 61.2 kg (134 lb 14.7 oz)    History of present illness:  24 M with Known h/o ileocolonic crohn's-ileocecostomy + ileostomy-managed at [facility], Dr. Sydell Axon in [place] , at baseline has 3-5 bowel movements that are usually formed prior rectal stricture 3 cm--not a good candidate for stricture repair, H/o multiple hernia, now on Humira, Iron deficiency anemia and protein energy malnutrition Admitted from the emergency room 08/25/2016 abdominal pain and possible SBO with perirectal mass being possible potentially secondary to Crohn's flare  Hospital Course:  1. SBO 2/2 to crohn's/Stricture -improving with supportive care/Steroids -CT with high-grade partial obstruction proximal to the ileostomy -was on IV Solu-medrol, now changed to Prednisone,  -Flex sig-anal stricture, rectal pouch was friable,and scope only advanced up about 7 cm at which point was the end of the pouch, no mass noted -resumed soft diet and tolerating this without symptoms -discharged home on prednisone 35m daily for 1 week and then 247mdaily for 1 week and  advised FU with his GI Dr.Rourk in 1-2weeks  2. Elevated PSA, ? mass noted on CT scan-Dr.Samtani d/w urology Dr.Herrick who felt that further work up as Outpatient  3. AKI /Dehydration with mild hyponatremia  -improving, cut down IVF, on soft diet  4. 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                      - Surgical rectal pouch. No mass or tumor.  Consultations:  GI Dr.Ganem  Discharge Exam: Vitals:   09/08/16 2141 09/09/16 0557  BP: (!) 111/59 116/72  Pulse: 73 62  Resp: 17 16  Temp: 99.6 F (37.6 C) 98 F (36.7 C)    General: stable Cardiovascular: S1S2/RRR Respiratory: CTAB  Discharge Instructions   Discharge Instructions    Diet - low sodium heart healthy    Complete by:  As directed    Increase activity slowly    Complete by:  As directed      Discharge Medication List as of 09/09/2016 11:43 AM    START taking these medications   Details  predniSONE (DELTASONE) 20 MG tablet Take 2 tablets (40 mg total) by mouth daily with breakfast. Take 4028mor 1 week then 74m72mr 1 week, until FU with, Starting Wed 09/10/2016, Print      CONTINUE these medications which have NOT CHANGED   Details  Adalimumab (HUMIRA PEN) 40 MG/0.8ML PNKT Inject 40 mg into the skin every 14 (fourteen) days., Starting Fri 05/23/2016, Normal    calcium carbonate (OS-CAL) 600 MG TABS tablet Take 1,200 mg by mouth daily with breakfast., Historical Med  Ferrous Sulfate 28 MG TABS Take 1 tablet by mouth daily., Historical Med    HYDROcodone-acetaminophen (NORCO/VICODIN) 5-325 MG tablet Take 1 tablet by mouth every 6 (six) hours as needed for moderate pain., Starting Tue 07/01/2016, Print    mirtazapine (REMERON) 15 MG tablet Take 1 tablet (15 mg total) by mouth at bedtime., Starting Tue 07/29/2016, Normal    Multiple Vitamins-Minerals (CENTRUM SILVER ADULT 50+) TABS Take 1 tablet by mouth daily., Historical Med        Allergies  Allergen Reactions  . Penicillins Hives   Follow-up Information    Ardis Hughs, MD. Schedule an appointment as soon as possible for a visit in 3 weeks.   Specialty:  Urology Contact information: [address] [place] [address] [phone]        Manus Rudd, MD. Schedule an appointment as soon as possible for a visit on 09/26/2016.   Specialty:  Gastroenterology Why:  Appointment with Dr. Gala Romney is on 09/26/16 at [facility] information: [address] [place] [address] [phone]            The results of significant diagnostics from this hospitalization (including imaging, microbiology, ancillary and laboratory) are listed below for reference.    Significant Diagnostic Studies: Ct Abdomen Pelvis W Contrast  Result Date: 09/04/2016 CLINICAL DATA:  Abdominal pain underlying ileostomy for 2 days. No ileostomy output. Nausea. Crohn' s disease. EXAM: CT ABDOMEN AND PELVIS WITH CONTRAST TECHNIQUE: Multidetector CT imaging of the abdomen and pelvis was performed using the standard protocol following bolus administration of intravenous contrast. CONTRAST:  157m ISOVUE-300 IOPAMIDOL (ISOVUE-300) INJECTION 61% COMPARISON:  Multiple exams, including 10/10/2014 and MRI from 10/23/2014 FINDINGS: Lower chest: Mild scarring in the posterior basal segment left lower lobe. Mild dependent subsegmental atelectasis in the lower lobes. Hepatobiliary: Indistinct extrahepatic biliary tree. Otherwise unremarkable. Pancreas: Dilated dorsal pancreatic duct measuring up to 5 mm in diameter, but stable from 2015. Spleen: Stable small left inferior splenic hemangioma, 1.2 cm in long axis on image 27/2. Adrenals/Urinary Tract: 4 mm hypodense lesion anteriorly in the left mid kidney, likely a cyst but technically nonspecific. Adrenal glands unremarkable. Stomach/Bowel: Most of the stomach body is collapsed. Poor definition of the descending duodenum. Accentuated mucosal  enhancement in a 10 cm loop of small bowel extending to the ostomy. Just proximal to this, a focally dilated loop of small bowel measures 6.3 cm in diameter on image 50/2, and there other loops of dilated small bowel measuring up to about 4 cm. There is segments of small bowel abnormal mucosal enhancement in scattered air-fluid levels. Moreover, on image 49series 2 there is a sSolicitorhernia believed to be involving small bowel and extending through the transverse abdominis muscle but not the oblique musculature. However, I am doubtful that this small Richter hernia is a site of obstruction. 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 There is a new masslike density along the right lateral rectal margin and right posterior margin of the prostate gland, measuring 3.1 by 4.3 cm on image 73/2 and extending in the perirectal space nearly to the operator internus muscle. There is increased stranding in the perirectal space and probably new inflammatory or neoplastic findings in the left lower perirectal space and along  the external sphincter on the left. Appearance on images 81-88 of series 2 raises the possibility of a perianal fistula extending to the intergluteal fold. Vascular/Lymphatic: Mesenteric nodes in the right abdomen measure up to 1.2 cm in short axis, some with prior. Reproductive: Mildly asymmetry of the right seminal vesicle. Possibly abnormal enhancement in the peripheral zone of the right prostate gland for example on image 74/2. Other: Diffuse mesenteric edema. Trace ascites along the right paracolic gutter. Paucity of intra-abdominal adipose tissue. Musculoskeletal: Fused sacroiliac joints. Markedly dilated inferior gluteal vein varicosities, image  80/5 on the right and image 84/5 on the left - this can be a cause for sciatica due to impingement on the sciatic nerves. IMPRESSION: 1. 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 There is abnormal mucosal enhancement in multiple segments of small bowel more characteristic of Crohn's disease, with intermittent dilated in nondilated segments. The largest dilated segment of small bowel is in the right abdomen about 10 cm proximal to the ostomy, and measures 6.3 cm in diameter. There is a somewhat abrupt angulation between the dilated segment of right-sided abdominal small bowel and the nondilated segment leading to the ostomy which has accentuated mucosal enhancement. The possibility of a stricture around image 53 of series 2 is not totally excluded. 2. There is an apparent mass along the right side of the rectum measuring 4.3 by 3.1 cm, extending through the external sphincter and abutting the operator internus. This mass also extends along the right posterolateral margin of the prostate gland where there some abnormal enhancement in the peripheral zone. This could be arising from the rectum or prostate. I am suspicious for cancer. 3. Left perirectal inflammatory findings with a possible left perianal fistula extending to the left side of the intergluteal fold. 4. Prominently dilated inferior gluteal veins, this can cause sciatica. 5. 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 6. Stably dilated dorsal pancreatic duct at 5 mm in diameter, not changed from 2015. 7. Diffuse mesenteric edema with paucity of intra-abdominal adipose tissue. 8. Few sacroiliac joints. Electronically Signed   By: Van Clines M.D.   On: 09/04/2016 11:31    Microbiology: No results found for this or any  previous visit (from the past 240 hour(s)).   Labs: Basic Metabolic Panel: No results for input(s): NA, K, CL, CO2, GLUCOSE, BUN, CREATININE, CALCIUM, MG, PHOS in the last 168 hours. Liver Function Tests: No results for input(s): AST, ALT, ALKPHOS, BILITOT, PROT, ALBUMIN in the last 168 hours. No results for input(s): LIPASE, AMYLASE in the last 168 hours. No results for input(s): AMMONIA in the last 168 hours. CBC: No results for input(s): WBC, NEUTROABS, HGB, HCT, MCV, PLT in the last 168 hours. Cardiac Enzymes: No results for input(s): CKTOTAL, CKMB, CKMBINDEX, TROPONINI in the last 168 hours. BNP: BNP (last 3 results) No results for input(s): BNP in the last 8760 hours.  ProBNP (last 3 results) No results for input(s): PROBNP in the last 8760 hours.  CBG: No results for input(s): GLUCAP in the last 168 hours.     SignedDomenic Polite MD.  [facility] 09/15/2016, 2:18 PM

## 2016-09-26 ENCOUNTER — Encounter: Payer: Self-pay | Admitting: Internal Medicine

## 2016-09-26 ENCOUNTER — Ambulatory Visit (INDEPENDENT_AMBULATORY_CARE_PROVIDER_SITE_OTHER): Payer: Medicare Other | Admitting: Internal Medicine

## 2016-09-26 VITALS — BP 112/67 | HR 84 | Temp 98.3°F | Ht 62.0 in | Wt 131.8 lb

## 2016-09-26 DIAGNOSIS — K50119 Crohn's disease of large intestine with unspecified complications: Secondary | ICD-10-CM

## 2016-09-26 NOTE — Patient Instructions (Addendum)
Prednisone 20 mg daily x 30 days; then 10 mg daily x 30 days; then 5 mg daily x 30 days - then off  Continue Huimira  Vitamin B12, CBC now  Bone density study  See urologist (Dr. Louis Meckel - [phone]) about prostate and elevated PSA.  Office visit here in 6 hile hospitalized in [place]. This was a limited exam due to his anatomy. No neoplastic or inflammatory process going on.weeks

## 2016-09-26 NOTE — Progress Notes (Signed)
Primary Care Physician:  Vic Blackbird, MD Primary Gastroenterologist:  Dr. Gala Romney  Pre-Procedure History & Physical: HPI:  Daniel Howard is a 60 y.o. male here for follow-up of complicated Crohn's disease. Recently admitted to [facility] with a small bowel instruction. Felt to have a possible stricture proximal to his ileostomy. Has multiple abdominal wall hernias-none of which were felt, radiographically, to be responsible for obstructing process.  Also, suspicious for an inflammatory/possibly neoplastic mass in the area of the rectum/prostate. Urology evaluation has yet to be done. He did have a flexible sigmoidoscopy wo any new findings.   Patient has been on Humira. Prednisone added to his regimen;  now on 20 mg daily. Feels better. No abdominal pain  - ileostomy seems to be functioning.  He is due for repeat B12 level and bone density studies.Paragraph he deniesfever chills  Past Medical History:  Diagnosis Date  . Abnormal finding of biliary tract    MRCP shows pancreatic/biliary tract dilation. EUS 2010 confirmed dilation but no chronic pancreaitis or mass. Vascular ectasia crimpoing distal CBD.   [name] Anxiety   . 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 Duodenal ulcer 2010   nsaids  . Low back pain   . Peristomal hernia   . Small bowel obstruction   . Spigelian hernia    bilateral    Past Surgical History:  Procedure Laterality Date  . APPENDECTOMY  1992   at time of exp laparotomy at which time he was noted to have fistulizing Crohn's rather than UC  . COLONOSCOPY [name] 08/23/2014   NOM:VEHMCNO proctoscopy with possible  fistulous opening in thebase of rectal/anal stump.    . COLOSTOMY  1992   transverse loop colostomy secondary to a stricture  . ESOPHAGOGASTRODUODENOSCOPY  05/2009   SLF: multiple antral erosions, large ulcer at ansatomosis (postsurgical changes at duodenal bulb and second portion of duodenum) BX c/x NSAIDS.  . EUS  10/04/2009   Dr. Estill Bakes dilated CBD and main pancreatic duct.  No pancreatic  . FLEXIBLE SIGMOIDOSCOPY  1988   Dr. Laural Golden- suggested rohn's disease but the biopsies were not collaborative.  [name] FLEXIBLE SIGMOIDOSCOPY [name] 09/08/2016   Procedure: FLEXIBLE SIGMOIDOSCOPY;  Surgeon: Wonda Horner, MD;  Location: [facility] ENDOSCOPY;  Service: Gastroenterology;  Laterality: [name];  . HEMICOLOECTOMY W/ ANASTOMOSIS  1993   R- Dr.DeMason   . [place]   incarcerated periostial hernia with additional surgery in 1999    Prior to Admission medications   Medication Sig Start Date End Date Taking? Authorizing Provider  Adalimumab (HUMIRA PEN) 40 MG/0.8ML PNKT Inject 40 mg into the skin every 14 (fourteen) days. 05/23/16  Yes Mahala Menghini, PA-C  calcium carbonate (OS-CAL) 600 MG TABS tablet Take 1,200 mg by mouth daily with breakfast.   Yes Historical Provider, MD  Ferrous Sulfate 28 MG TABS Take 1 tablet by mouth daily.   Yes Historical Provider, MD  HYDROcodone-acetaminophen (NORCO/VICODIN) 5-325 MG tablet Take 1 tablet by mouth every 6 (six) hours as needed for moderate pain. 07/01/16  Yes Alycia Rossetti, MD  mirtazapine (REMERON) 15 MG tablet Take 1 tablet (15 mg total) by mouth at bedtime. 07/29/16  Yes  Alycia Rossetti, MD  Multiple Vitamins-Minerals (CENTRUM SILVER ADULT 50+) TABS Take 1 tablet by mouth daily.   Yes Historical Provider, MD  predniSONE (DELTASONE) 20 MG tablet Take 2 tablets (40 mg total) by mouth daily with breakfast. Take 72m for 1 week then 258mfor 1 week, until FU with 09/10/16  Yes PrDomenic PoliteMD    Allergies as of 09/26/2016 - Review Complete 09/26/2016    Allergen Reaction Noted  . Penicillins Hives 01/25/2007    Family History  Problem Relation Age of Onset  . Cancer Father     prostate   . Prostate cancer Father   . Hypertension Sister   . Cancer Sister   . Depression Sister   . Breast cancer Sister   . COPD Sister   . Aneurysm Brother     deceased, brain aneurysm    Social History   Social History  . Marital status: Married    Spouse name: [name]  . Number of children: 9 66. Years of education: [name]   Occupational History  . disabled Unemployed   Social History Main Topics  . Smoking status: Former Smoker    Packs/day: 0.50    Years: 2.00    Types: Cigarettes    Quit date: 12/21/2009  . Smokeless tobacco: Never Used     Comment: Quit abut 20 years  . Alcohol use No     Comment: Former drinker  . Drug use: No  . Sexual activity: Yes    Partners: Female   Other Topics Concern  . Not on file   Social History Narrative  . No narrative on file    Review of Systems: See HPI, otherwise negative ROS  Physical Exam: BP 112/67   Pulse 84   Temp 98.3 F (36.8 C) (Oral)   Ht 5' 2"  (1.575 m)   Wt 131 lb 12.8 oz (59.8 kg)   BMI 24.11 kg/m  General:   Alert,  Well-developed, well-nourished, pleasant and cooperative in NAD Skin:  Intact without significant lesions or rashes. Eyes:  Sclera clear, no icterus.   Conjunctiva pink. Ears:  Normal auditory acuity. Nose:  No deformity, discharge,  or lesions. Mouth:  No deformity or lesions. Neck:  Supple; no masses or thyromegaly. No significant cervical adenopathy. Lungs:  Clear throughout to auscultation.   No wheezes, crackles, or rhonchi. No acute distress. Heart:  Regular rate and rhythm; no murmurs, clicks, rubs,  or gallops. Abdomen: Non-distended, normal bowel sounds.  Soft and nontender without appreciable mass or hepatosplenomegaly.  Pulses:  Normal pulses noted. Extremities:  Without clubbing or edema.  Impression:  Very pleasant [age]ear old gentleman  with extremely complicated Crohn's disease recently hospitalized with apparent small bowel obstruction. Clinically, much improved now on prednisone-down to 20 mg daily and Humira. Cross-sectional imaging via CT has raised the possibility of an inflammatory/neoplastic mass the area of the rectum/prostate. PSA elevated. CEA normal.  A good bit of this large bowel is sequestered and cannot be directly visualized. Recent flexible sigmoidoscopy results noted.  Further urological evaluation is pending.    Recent Leukocytosis -  nonspecific but may be reactive or related to corticosteroid therapy. CBC needs to be repeated. He had a lower limit of normal vitamin B12 level previously. He likely will need to go on supplementation.  Due for a bone density study as well.  As previously stated, the best way to image his residual large and small bowel is with magnet via magnetic resonance enterography.  Recommendations:  Prednisone 20 mg daily x 30 days; then 10 mg daily x 30 days; then 5 mg daily x 30 days - then off  Continue Huimira  Vitamin B12, CBC now  Bone density study  See urologist (Dr. Louis Meckel - [phone]) about prostate and elevated PSA.  Office visit here in 6 weeks.  We'll consider moving his MRE up depending on findings of the urology evaluation.     Notice: This dictation was prepared with Dragon dictation along with smaller phrase technology. Any transcriptional errors that result from this process are unintentional and may not be corrected upon review.

## 2016-09-30 ENCOUNTER — Other Ambulatory Visit: Payer: Self-pay | Admitting: Internal Medicine

## 2016-09-30 ENCOUNTER — Other Ambulatory Visit: Payer: Self-pay

## 2016-09-30 ENCOUNTER — Telehealth: Payer: Self-pay | Admitting: Internal Medicine

## 2016-09-30 DIAGNOSIS — K50819 Crohn's disease of both small and large intestine with unspecified complications: Secondary | ICD-10-CM

## 2016-09-30 NOTE — Telephone Encounter (Signed)
Pt's wife called to say that the B12 order they had got torn up and they needed another one. [phone]

## 2016-09-30 NOTE — Telephone Encounter (Signed)
Lab orders were incorrect the first time and had to be redone. I have mailed them to the pt and his wife is aware.

## 2016-10-02 ENCOUNTER — Other Ambulatory Visit (HOSPITAL_COMMUNITY): Payer: Medicare Other

## 2016-10-29 ENCOUNTER — Ambulatory Visit (HOSPITAL_COMMUNITY)
Admission: RE | Admit: 2016-10-29 | Discharge: 2016-10-29 | Disposition: A | Discharge status: Home / Self Care | Payer: Medicare Other | Source: Ambulatory Visit | Attending: Family Medicine | Admitting: Family Medicine

## 2016-10-29 DIAGNOSIS — Z13820 Encounter for screening for osteoporosis: Secondary | ICD-10-CM

## 2016-10-29 DIAGNOSIS — M85851 Other specified disorders of bone density and structure, right thigh: Secondary | ICD-10-CM | POA: Diagnosis not present

## 2016-10-29 DIAGNOSIS — M859 Disorder of bone density and structure, unspecified: Secondary | ICD-10-CM

## 2016-10-29 DIAGNOSIS — Z79899 Other long term (current) drug therapy: Secondary | ICD-10-CM

## 2016-10-30 ENCOUNTER — Encounter: Payer: Self-pay | Admitting: *Deleted

## 2016-11-03 ENCOUNTER — Ambulatory Visit (INDEPENDENT_AMBULATORY_CARE_PROVIDER_SITE_OTHER): Payer: Medicare Other | Admitting: Family Medicine

## 2016-11-03 ENCOUNTER — Encounter: Payer: Self-pay | Admitting: Family Medicine

## 2016-11-03 VITALS — BP 120/70 | HR 84 | Temp 98.7°F | Resp 14 | Ht 62.0 in | Wt 146.0 lb

## 2016-11-03 DIAGNOSIS — D530 Protein deficiency anemia: Secondary | ICD-10-CM | POA: Diagnosis not present

## 2016-11-03 DIAGNOSIS — Z23 Encounter for immunization: Secondary | ICD-10-CM | POA: Diagnosis not present

## 2016-11-03 DIAGNOSIS — M8580 Other specified disorders of bone density and structure, unspecified site: Secondary | ICD-10-CM | POA: Diagnosis not present

## 2016-11-03 DIAGNOSIS — K50819 Crohn's disease of both small and large intestine with unspecified complications: Secondary | ICD-10-CM | POA: Diagnosis not present

## 2016-11-03 LAB — CBC WITH DIFFERENTIAL/PLATELET
BASOS ABS: 0 {cells}/uL (ref 0–200)
Basophils Relative: 0 %
EOS PCT: 2 %
Eosinophils Absolute: 162 cells/uL (ref 15–500)
HCT: 42.9 % (ref 38.5–50.0)
Hemoglobin: 14 g/dL (ref 13.0–17.0)
LYMPHS PCT: 21 %
Lymphs Abs: 1701 cells/uL (ref 850–3900)
MCH: 33 pg (ref 27.0–33.0)
MCHC: 32.6 g/dL (ref 32.0–36.0)
MCV: 101.2 fL — ABNORMAL HIGH (ref 80.0–100.0)
MONOS PCT: 4 %
MPV: 9.1 fL (ref 7.5–12.5)
Monocytes Absolute: 324 cells/uL (ref 200–950)
NEUTROS ABS: 5913 {cells}/uL (ref 1500–7800)
Neutrophils Relative %: 73 %
PLATELETS: 290 10*3/uL (ref 140–400)
RBC: 4.24 MIL/uL (ref 4.20–5.80)
RDW: 13.6 % (ref 11.0–15.0)
WBC: 8.1 10*3/uL (ref 3.8–10.8)

## 2016-11-03 LAB — COMPREHENSIVE METABOLIC PANEL
ALT: 13 U/L (ref 9–46)
AST: 20 U/L (ref 10–35)
Albumin: 3.8 g/dL (ref 3.6–5.1)
Alkaline Phosphatase: 44 U/L (ref 40–115)
BUN: 17 mg/dL (ref 7–25)
CHLORIDE: 104 mmol/L (ref 98–110)
CO2: 29 mmol/L (ref 20–31)
CREATININE: 1.23 mg/dL (ref 0.70–1.33)
Calcium: 9.5 mg/dL (ref 8.6–10.3)
GLUCOSE: 92 mg/dL (ref 70–99)
Potassium: 4.9 mmol/L (ref 3.5–5.3)
SODIUM: 140 mmol/L (ref 135–146)
TOTAL PROTEIN: 7.1 g/dL (ref 6.1–8.1)
Total Bilirubin: 0.6 mg/dL (ref 0.2–1.2)

## 2016-11-03 LAB — VITAMIN B12: VITAMIN B 12: 671 pg/mL (ref 200–1100)

## 2016-11-03 MED ORDER — HYDROCODONE-ACETAMINOPHEN 5-325 MG PO TABS: 1.0000 | ORAL_TABLET | Freq: Four times a day (QID) | ORAL | 0 refills | Status: DC | PRN

## 2016-11-03 NOTE — Assessment & Plan Note (Signed)
Labs will be obtained per his gastroenterologist orders.  I refilled his chronic pain medication. He is still on the Remeron which were using for protein malnutrition he has gained some weight secondary to the use of the prednisone as well

## 2016-11-03 NOTE — Progress Notes (Signed)
   Subjective:    Patient ID: Daniel Howard, male    DOB: 1956/06/02, 60 y.o.   MRN: [id number]  Patient presents for F/U (is not fasting)   Pt here to f/u, on chronic pain medication due to chron's disease ,was admitted with SBO in sept.  Has seen GI in OCt since admission ,also noted on scan, swelling near rectal/prostate region, concern for malignancy, has not seen urology for this.    PSA was elevated at 7.08 while in the hospital , He has appointment to see urologist on the 28th of this month. Ostomy difficulty with his urine stream.  For his Crohn's disease he is on a taper of prednisone. He is currently on 10 mg for the next 30 days and will then reduce to 5 mg daily.  Chronic abdominal pain from his Crohn's disease he takes his hydrocodone requesting refill of this. He has some laxity due to for his chronic anemia and B-12 deficiency, they do not have these done yet because of transportation issues therefore I will draw them today Due for flu shot  Review bone density he does have osteopenia he is on vitamin D and calcium Review Of Systems:  GEN- denies fatigue, fever, weight loss,weakness, recent illness HEENT- denies eye drainage, change in vision, nasal discharge, CVS- denies chest pain, palpitations RESP- denies SOB, cough, wheeze ABD- denies N/V, change in stools, abd pain GU- denies dysuria, hematuria, dribbling, incontinence MSK- denies joint pain, muscle aches, injury Neuro- denies headache, dizziness, syncope, seizure activity       Objective:    BP 120/70 (BP Location: Left Arm, Patient Position: Sitting, Cuff Size: Normal)   Pulse 84   Temp 98.7 F (37.1 C) (Oral)   Resp 14   Ht 5' 2"  (1.575 m)   Wt 146 lb (66.2 kg)   SpO2 98%   BMI 26.70 kg/m  GEN- NAD, alert and oriented x3 HEENT- PERRL, EOMI, non injected sclera, pink conjunctiva, MMM, oropharynx clear Neck- Supple, no thyromegaly CVS- RRR, no murmur RESP-CTAB ABD-NABS,soft,NT,ND EXT- No  edema Pulses- Radial, DP- 2+        Assessment & Plan:      Problem List Items Addressed This Visit    Osteopenia    Continue with vitamin D and calcium weight-bearing exercise      Crohn's disease of both small and large intestine with complication ([place])    Labs will be obtained per his gastroenterologist orders.  I refilled his chronic pain medication. He is still on the Remeron which were using for protein malnutrition he has gained some weight secondary to the use of the prednisone as well      Relevant Orders   CBC with Differential/Platelet   Comprehensive metabolic panel   Anemia - Primary   Relevant Orders   Vitamin B12    Other Visit Diagnoses    Need for prophylactic vaccination and inoculation against influenza       Relevant Orders   Flu Vaccine QUAD 36+ mos PF IM (Fluarix & Fluzone Quad PF) (Completed)      Note: This dictation was prepared with Dragon dictation along with smaller phrase technology. Any transcriptional errors that result from this process are unintentional.

## 2016-11-03 NOTE — Patient Instructions (Addendum)
Flu shot done We will call with lab results  Pain medication refilled F/U 4 months

## 2016-11-03 NOTE — Assessment & Plan Note (Signed)
Continue with vitamin D and calcium weight-bearing exercise

## 2016-11-11 ENCOUNTER — Ambulatory Visit: Payer: Medicare Other | Admitting: Internal Medicine

## 2016-11-15 ENCOUNTER — Other Ambulatory Visit: Payer: Self-pay | Admitting: Gastroenterology

## 2016-11-21 ENCOUNTER — Ambulatory Visit: Payer: Medicare Other | Admitting: Internal Medicine

## 2016-12-02 ENCOUNTER — Encounter: Payer: Self-pay | Admitting: Internal Medicine

## 2016-12-04 ENCOUNTER — Encounter (HOSPITAL_COMMUNITY): Payer: Self-pay | Admitting: Emergency Medicine

## 2016-12-04 ENCOUNTER — Emergency Department (HOSPITAL_COMMUNITY)
Admission: EM | Admit: 2016-12-04 | Discharge: 2016-12-04 | Disposition: A | Discharge status: Home / Self Care | Payer: Medicare Other | Attending: Emergency Medicine | Admitting: Emergency Medicine

## 2016-12-04 DIAGNOSIS — R040 Epistaxis: Secondary | ICD-10-CM | POA: Diagnosis not present

## 2016-12-04 DIAGNOSIS — Z87891 Personal history of nicotine dependence: Secondary | ICD-10-CM | POA: Insufficient documentation

## 2016-12-04 LAB — CBC
HEMATOCRIT: 39.9 % (ref 39.0–52.0)
Hemoglobin: 13.5 g/dL (ref 13.0–17.0)
MCH: 32.8 pg (ref 26.0–34.0)
MCHC: 33.8 g/dL (ref 30.0–36.0)
MCV: 97.1 fL (ref 78.0–100.0)
PLATELETS: 256 10*3/uL (ref 150–400)
RBC: 4.11 MIL/uL — ABNORMAL LOW (ref 4.22–5.81)
RDW: 12.7 % (ref 11.5–15.5)
WBC: 9.5 10*3/uL (ref 4.0–10.5)

## 2016-12-04 MED ORDER — SALINE SPRAY 0.65 % NA SOLN: 2.0000 | Freq: Three times a day (TID) | NASAL | 1 refills | Status: DC

## 2016-12-04 NOTE — ED Provider Notes (Signed)
Emergency Department Provider Note   I have reviewed the triage vital signs and the nursing notes.   HISTORY  Chief Complaint Epistaxis   HPI Daniel Howard is a 60 y.o. male with past medical history significant for Crohn's disease, came to ED with complaint of epistaxis started around 8 AM, lasted for about half an hour. Patient states that he do get occasional epistaxis, but this was worse. He also endorses spitting up some clots after packing his nose. He also complained of dizziness when standing from sitting position. He does not use a humidifier at home.  He denies any headache, nasal congestion, change in vision, sore throat or recent sick contacts.   Past Medical History:  Diagnosis Date  . Abnormal finding of biliary tract    MRCP shows pancreatic/biliary tract dilation. EUS 2010 confirmed dilation but no chronic pancreaitis or mass. Vascular ectasia crimpoing distal CBD.   [name] Anxiety   . 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 Duodenal ulcer 2010   nsaids  . Low back pain   . Peristomal hernia   . Small bowel obstruction   . Spigelian hernia    bilateral    Patient Active Problem List   Diagnosis Date Noted  . Osteopenia 11/03/2016  . Rectal mass 09/04/2016  . 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 Exacerbation of Crohn's disease ([place]) 09/04/2016  . Trigger finger, acquired 10/31/2015  . Protein-calorie malnutrition, severe ([place]) 07/27/2014  . SBO (small bowel obstruction) ([place]) 07/26/2014  . Loss of weight 06/09/2014  . Hypotension, unspecified 06/09/2014  . Crohn's disease of both small and large intestine with  complication ([place]) 46/27/0350  . Boils 02/11/2013  . Ventral hernia 12/11/2009  . Anemia 12/03/2009  . 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 LOW BACK PAIN 01/25/2007    Past Surgical History:  Procedure Laterality Date  . APPENDECTOMY  1992   at time of exp laparotomy at which time he was noted to have fistulizing Crohn's rather than UC  . COLONOSCOPY [name] 08/23/2014   KXF:GHWEXHB proctoscopy with possible fistulous opening in thebase of rectal/anal stump.    . COLOSTOMY  1992   transverse loop colostomy secondary to a stricture  . ESOPHAGOGASTRODUODENOSCOPY  05/2009   SLF: multiple antral erosions, large ulcer at ansatomosis (postsurgical changes at duodenal bulb and second portion of duodenum) BX c/x NSAIDS.  . EUS  10/04/2009   Dr. Estill Bakes dilated CBD and main pancreatic duct.  No pancreatic  . FLEXIBLE SIGMOIDOSCOPY  1988   Dr. Laural Golden- suggested rohn's disease but the biopsies were not collaborative.  [name] FLEXIBLE SIGMOIDOSCOPY [name] 09/08/2016   Procedure: FLEXIBLE SIGMOIDOSCOPY;  Surgeon: Wonda Horner, MD;  Location: [facility] ENDOSCOPY;  Service: Gastroenterology;  Laterality: [name];  . HEMICOLOECTOMY W/ ANASTOMOSIS  1993   R- Dr.DeMason   . [place]   incarcerated periostial hernia with additional surgery in 1999    Current Outpatient Rx  . Order #: [id number] Class: Historical Med  . Order #: [id number] Class: Historical Med  . Order #: [id number] Class: Normal  . Order #: [id number] Class: Print  . Order #: [id number] Class: Normal  . Order #: [id number] Class: Historical Med  . Order #: [id number] Class: Print  Allergies Penicillins  Family History  Problem Relation Age of Onset  . Cancer Father     prostate   . Prostate cancer Father   . Hypertension Sister   . Cancer Sister   . Depression Sister   . Breast cancer Sister   . COPD Sister   . Aneurysm Brother     deceased, brain aneurysm    Social History Social History  Substance Use Topics  . Smoking  status: Former Smoker    Packs/day: 0.50    Years: 2.00    Types: Cigarettes    Quit date: 12/21/2009  . Smokeless tobacco: Never Used     Comment: Quit abut 20 years  . Alcohol use No     Comment: Former drinker    Review of Systems Constitutional: No fever/chills Eyes: No visual changes. ENT: No sore throat. Bleeding from the nose. Cardiovascular: Denies chest pain. Respiratory: Denies shortness of breath. Gastrointestinal: No abdominal pain.  No nausea, no vomiting.  No diarrhea.  No constipation. Genitourinary: Negative for dysuria. Musculoskeletal: Negative for back pain. Skin: Negative for rash. Neurological: Negative for headaches, focal weakness or numbness. 10-point ROS otherwise negative.  ____________________________________________   PHYSICAL EXAM:  VITAL SIGNS: ED Triage Vitals [12/04/16 0933]  Enc Vitals Group     BP 129/76     Pulse Rate 89     Resp 17     Temp 98.1 F (36.7 C)     Temp Source Oral     SpO2 100 %     Weight      Height      Head Circumference      Peak Flow      Pain Score      Pain Loc      Pain Edu?      Excl. in [place]?    Constitutional: Alert and oriented. Well appearing and in no acute distress. Eyes: Conjunctivae are normal. PERRL. EOMI. Head: Atraumatic. Nose: Erythematous nasal septum bilaterally, no active source of bleeding seen. Mouth/Throat: Mucous membranes are moist.  Oropharynx non-erythematous. Neck: No stridor.  No meningeal signs.  Cardiovascular: Normal rate, regular rhythm. Good peripheral circulation. Grossly normal heart sounds.   Respiratory: Normal respiratory effort.  No retractions. Lungs CTAB. Gastrointestinal: Soft and nontender. No distention. Colostomy bag intact with some fecal matter and no sign of infection. Musculoskeletal: No lower extremity tenderness nor edema. No gross deformities of extremities. Neurologic:  Normal speech and language. No gross focal neurologic deficits are appreciated.    Skin:  Skin is warm, dry and intact. No rash noted. Psychiatric: Mood and affect are normal. Speech and behavior are normal.  ____________________________________________   LABS (all labs ordered are listed, but only abnormal results are displayed)  Labs Reviewed  CBC - Abnormal; Notable for the following:       Result Value   RBC 4.11 (*)    All other components within normal limits   ____________________________________________  EKG None  ____________________________________________  RADIOLOGY  No results found.  ____________________________________________   PROCEDURES  Procedure(s) performed:   Procedures   ____________________________________________   INITIAL IMPRESSION / ASSESSMENT AND PLAN / ED COURSE  Pertinent labs & imaging results that were available during my care of the patient were reviewed by me and considered in my medical decision making (see chart for details).  Daniel Howard is a 60 y.o. male with past medical history significant for Crohn's disease, came to ED with complaint of epistaxis started around 8 AM,  lasted for about half an hour. Patient states that he do get occasional epistaxis, but this was worse. He also endorses spitting up some clots after packing his nose. He also complained of dizziness when standing from sitting position.  We will check his CBC and orthostatic vitals. Orthostatic VS for the past 24 hrs:  BP- Lying Pulse- Lying BP- Sitting Pulse- Sitting BP- Standing at 0 minutes Pulse- Standing at 0 minutes  12/04/16 1104 124/77 74 121/79 78 110/82 82   His hemoglobin is stable and he is having nonsignificant orthostatic drop.  As his nose. Has been stopped, there are no active identified, he do have quite erythematous nasal septum. Most probably it was due to dryness.  He is being discharged home with normal saline nasal spray and advised to use humidifier. He can return if he started getting this bleed again, which  does not stop after initial conservative measures.  ____________________________________________  FINAL CLINICAL IMPRESSION(S) / ED DIAGNOSES  Epistaxis.   MEDICATIONS GIVEN DURING THIS VISIT:  Medications - No data to display   NEW OUTPATIENT MEDICATIONS STARTED DURING THIS VISIT:  New Prescriptions   No medications on file      Note:  This document was prepared using Dragon voice recognition software and may include unintentional dictation errors.  Emergency Medicine   Lorella Nimrod, MD 12/04/16 1112    Leo Grosser, MD 12/05/16 [phone]

## 2016-12-04 NOTE — ED Notes (Signed)
Patient states nose started bleeding this AM. Nose not bleeding now. No c/o h/a, visual changes, or weakness. Patient states he was feeling dizzy a little bit. Patient states the bleed has currently stopped. Not on blood thinners.

## 2016-12-04 NOTE — Discharge Instructions (Signed)
Thank you for visiting ED today. Your hemoglobin is stable. As you have already stopped the and there is no site of active leading seen, there is no need to do any further management at this point. Please use nasal normal saline as directed. Please use and humidifier at home which will prevent more episodes of nosebleed. You should take  medical attention, if your subsequent nosebleed does not stop with initial conservative measures.

## 2016-12-04 NOTE — ED Triage Notes (Signed)
Pt here for nose bleed starting this am; pt sts hx of same but more severe this am; pt sts some dizziness but denies HA

## 2016-12-18 ENCOUNTER — Other Ambulatory Visit: Payer: Self-pay

## 2016-12-19 MED ORDER — ADALIMUMAB 40 MG/0.8ML ~~LOC~~ AJKT: 40.0000 mg | AUTO-INJECTOR | SUBCUTANEOUS | 6 refills | Status: DC

## 2016-12-21 ENCOUNTER — Other Ambulatory Visit: Payer: Self-pay | Admitting: Internal Medicine

## 2016-12-22 DIAGNOSIS — C61 Malignant neoplasm of prostate: Secondary | ICD-10-CM

## 2016-12-22 HISTORY — DX: Malignant neoplasm of prostate: C61

## 2016-12-26 ENCOUNTER — Telehealth: Payer: Self-pay | Admitting: Internal Medicine

## 2016-12-26 NOTE — Telephone Encounter (Signed)
Routing to the refill box. There is a refill request in epic.

## 2016-12-26 NOTE — Telephone Encounter (Signed)
PATIENT WIFE CALLED AND STATED THAT THE [facility] ON [address] NEEDS APPROVAL TO FILL HIS PREDNISONE   [phone]

## 2016-12-29 ENCOUNTER — Other Ambulatory Visit: Payer: Self-pay | Admitting: Internal Medicine

## 2016-12-30 NOTE — Telephone Encounter (Signed)
He should be off Prednisone now. Was tapered off at last appt. Last saw RMR.

## 2016-12-30 NOTE — Telephone Encounter (Signed)
Spoke with the pts wife, she said the pt is out of prednisone but he is doing well. I called the pharmacy and they gave him rx as prescribed. Pt wife said he was doing well, eating well, and gaining weight. She said he is still taking the humira. They are aware of appt on 01/16/17 with RMR. I told her that he wont get any more prednisone unless he has problems like he had previously. She verbalized understanding.

## 2016-12-30 NOTE — Telephone Encounter (Signed)
Communication noted.  

## 2016-12-31 NOTE — Telephone Encounter (Signed)
Patient should have tapered off prednisone. Is there a reason we keep getting rx refill requests?

## 2016-12-31 NOTE — Telephone Encounter (Signed)
See phone note. I spoke with the pts wife about this yesterday.

## 2017-01-13 DIAGNOSIS — R972 Elevated prostate specific antigen [PSA]: Secondary | ICD-10-CM | POA: Diagnosis not present

## 2017-01-13 DIAGNOSIS — N400 Enlarged prostate without lower urinary tract symptoms: Secondary | ICD-10-CM | POA: Diagnosis not present

## 2017-01-16 ENCOUNTER — Encounter: Payer: Self-pay | Admitting: Internal Medicine

## 2017-01-16 ENCOUNTER — Ambulatory Visit (INDEPENDENT_AMBULATORY_CARE_PROVIDER_SITE_OTHER): Payer: Medicare Other | Admitting: Internal Medicine

## 2017-01-16 ENCOUNTER — Other Ambulatory Visit: Payer: Self-pay

## 2017-01-16 VITALS — BP 115/75 | HR 83 | Temp 97.6°F | Ht 62.0 in | Wt 156.4 lb

## 2017-01-16 DIAGNOSIS — K50812 Crohn's disease of both small and large intestine with intestinal obstruction: Secondary | ICD-10-CM

## 2017-01-16 DIAGNOSIS — R935 Abnormal findings on diagnostic imaging of other abdominal regions, including retroperitoneum: Secondary | ICD-10-CM

## 2017-01-16 NOTE — Progress Notes (Signed)
Primary Care Physician:  Vic Blackbird, MD Primary Gastroenterologist:  Dr. Gala Romney   Pre-Procedure History & Physical: HPI:  Daniel Howard is a 61 y.o. male here for follow-up. Complicated ileocolonic Crohn's disease. Status post multiple surgeries multiple abdominal wall hernias. Doing well since she was last seen. Bone density study indicates osteopenia-on calcium and vitamin D. Continues on Humira. Still taking prednisone 10 mg daily. Should've finished this age it by now. He's gained 20-25 pounds since he was last seen. States appetite is great. Current corticosteroids likely playing a role. No abdominal pain nausea or vomiting. Abnormal rectal prostate area on prior imaging. He did see the urologist. No prostate process felt to be the case as I understand. He has been a good 3 years since he had MR E (the best imaging modality to surveillance his GI tract given the multiple surgeries she's had previously). Standard colon cancer screening is precluded because centers a good segment of his colon which is sequestered.  Past Medical History:  Diagnosis Date  . Abnormal finding of biliary tract    MRCP shows pancreatic/biliary tract dilation. EUS 2010 confirmed dilation but no chronic pancreaitis or mass. Vascular ectasia crimpoing distal CBD.   [name] Anxiety   . 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 Duodenal ulcer 2010   nsaids  . Low back pain   . Peristomal hernia   . Small bowel obstruction   . Spigelian hernia    bilateral    Past Surgical History:  Procedure Laterality Date  . APPENDECTOMY  1992   at time of exp laparotomy at which time  he was noted to have fistulizing Crohn's rather than UC  . COLONOSCOPY [name] 08/23/2014   [id number] proctoscopy with possible fistulous opening in thebase of rectal/anal stump.    . COLOSTOMY  1992   transverse loop colostomy secondary to a stricture  . ESOPHAGOGASTRODUODENOSCOPY  05/2009   SLF: multiple antral erosions, large ulcer at ansatomosis (postsurgical changes at duodenal bulb and second portion of duodenum) BX c/x NSAIDS.  . EUS  10/04/2009   Dr. Estill Bakes dilated CBD and main pancreatic duct.  No pancreatic  . FLEXIBLE SIGMOIDOSCOPY  1988   Dr. Laural Golden- suggested rohn's disease but the biopsies were not collaborative.  [name] FLEXIBLE SIGMOIDOSCOPY [name] 09/08/2016   Procedure: FLEXIBLE SIGMOIDOSCOPY;  Surgeon: Wonda Horner, MD;  Location: [facility] ENDOSCOPY;  Service: Gastroenterology;  Laterality: [name];  . HEMICOLOECTOMY W/ ANASTOMOSIS  1993   R- Dr.DeMason   . [place]   incarcerated periostial hernia with additional surgery in 1999    Prior to Admission medications   Medication Sig Start Date End Date Taking? Authorizing Provider  Adalimumab (HUMIRA PEN) 40 MG/0.8ML PNKT Inject 40 mg into the skin every 14 (fourteen) days. 12/19/16  Yes Annitta Needs, NP  calcium carbonate (OS-CAL) 600 MG TABS tablet Take 1,200 mg by mouth daily with breakfast.   Yes Historical Provider, MD  Ferrous Sulfate 28 MG TABS Take 1 tablet by mouth daily.   Yes Historical Provider, MD  mirtazapine (REMERON) 15 MG tablet Take 1 tablet (15 mg total) by mouth at bedtime. 07/29/16  Yes Alycia Rossetti, MD  Multiple  Vitamins-Minerals (CENTRUM SILVER ADULT 50+) TABS Take 1 tablet by mouth daily.   Yes Historical Provider, MD  predniSONE (DELTASONE) 10 MG tablet TAKE 2 TABLETS DAILYX 30 DAYS,1TAB DAILY X 30 DAYS,THEN 1/2 TAB DAILY X 30 DAYS 12/31/16  Yes Annitta Needs, NP  sodium chloride (OCEAN) 0.65 % SOLN nasal spray Place 2 sprays into both nostrils 3 (three) times daily. 12/04/16  Yes Lorella Nimrod, MD    HYDROcodone-acetaminophen (NORCO/VICODIN) 5-325 MG tablet Take 1 tablet by mouth every 6 (six) hours as needed for moderate pain. Patient not taking: Reported on 01/16/2017 11/03/16   Alycia Rossetti, MD    Allergies as of 01/16/2017 - Review Complete 01/16/2017  Allergen Reaction Noted  . Penicillins Hives 01/25/2007    Family History  Problem Relation Age of Onset  . Cancer Father     prostate   . Prostate cancer Father   . Hypertension Sister   . Cancer Sister   . Depression Sister   . Breast cancer Sister   . COPD Sister   . Aneurysm Brother     deceased, brain aneurysm    Social History   Social History  . Marital status: Married    Spouse name: [name]  . Number of children: 21  . Years of education: [name]   Occupational History  . disabled Unemployed   Social History Main Topics  . Smoking status: Former Smoker    Packs/day: 0.50    Years: 2.00    Types: Cigarettes    Quit date: 12/21/2009  . Smokeless tobacco: Never Used     Comment: Quit abut 20 years  . Alcohol use No     Comment: Former drinker  . Drug use: No  . Sexual activity: Yes    Partners: Female   Other Topics Concern  . Not on file   Social History Narrative  . No narrative on file    Review of Systems: See HPI, otherwise negative ROS  Physical Exam: BP 115/75   Pulse 83   Temp 97.6 F (36.4 C) (Oral)   Ht 5' 2"  (1.575 m)   Wt 156 lb 6.4 oz (70.9 kg)   BMI 28.61 kg/m  General:   Alert,  , pleasant and cooperative in NAD. By spouse and son. Lungs:  Clear throughout to auscultation.   No wheezes, crackles, or rhonchi. No acute distress. Heart:  Regular rate and rhythm; no murmurs, clicks, rubs,  or gallops. Abdomen: Ileostomy right lower quadrant. Keloid scar formation. Multiple surgical scars. Multiple abdominal wall hernias palpable. Abdomen is soft and nontender. ulses:  Normal pulses noted. Extremities:  Without clubbing or edema.  Impression:  Very pleasant 61 year old,  complicated Crohn's disease. Status post multiple surgeries previously. Sequestered large bowel not accessible with the scope. Abnormal appearing rectal mucosa recently may likely be post operative/chronic inflammatory changes. Urology consultation is been done. It's been a good 3 years since he had his residual lower GI tract evaluated. This is best done with MR E  Recommendations:   Decrease Prednisone to 5 mg daily x 1 month; then stop  Continue humira, vitamin B-12, Vit D and Ca  MRE of colon / small bowel - F/u abnormal rectum on prior CT  OV in 4 months  Consider repeat bone density study in approximately 3 years.    Notice: This dictation was prepared with Dragon dictation along with smaller phrase technology. Any transcriptional errors that result from this process are unintentional and may not be corrected upon  review.

## 2017-01-16 NOTE — Patient Instructions (Addendum)
Decrease Prednisone to 5 mg daily x 1 month; then stop  Continue humira, Vit D and Ca  MRE of colon / small bowel - F/u abnormal rectum on prior CT  OV in 4 months

## 2017-01-16 NOTE — Progress Notes (Signed)
Primary Care Physician:  Vic Blackbird, MD Primary Gastroenterologist:  Dr. Gala Romney  Pre-Procedure History & Physical: HPI:  Daniel Howard is a 61 y.o. male here with a history of complicated Crohn disease here for follow-up. Admitted last year in [place] with a small bowel obstruction which resolved with conservative measures. Abnormal appearing rectum and prostate that time sigmoidoscopy down there demonstrated a short rectal stump. Long-standing Crohn's disease with multiple fistula. Has been doing well on Humira. Will start on prednisone he is currently on a taper. He should actually be down to 5 mg daily but still taking 10 mg daily. Saw the urologist. Not felt to have any significant urologic issues.  Complicated surgically altered lower GI tract. A segment of colon sequestered that cannot be surveyed endoscopically. Last MR E about 3 years ago. This is the modality needed to image his colon In the future.  He is doing well now. Tells me ileostomy functioning well. Has gained a good 20 pounds with prednisone appetite described as being very good.  He is taking B12 supplements. Recent bone density study demonstrated osteopenia. He is on vitamin D and calcium supplementation  Past Medical History:  Diagnosis Date  . Abnormal finding of biliary tract    MRCP shows pancreatic/biliary tract dilation. EUS 2010 confirmed dilation but no chronic pancreaitis or mass. Vascular ectasia crimpoing distal CBD.   [name] Anxiety   . 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 Duodenal ulcer 2010   nsaids  . Low back pain   . Peristomal hernia   . Small bowel  obstruction   . Spigelian hernia    bilateral    Past Surgical History:  Procedure Laterality Date  . APPENDECTOMY  1992   at time of exp laparotomy at which time he was noted to have fistulizing Crohn's rather than UC  . COLONOSCOPY [name] 08/23/2014   [id number]:[id number] proctoscopy with possible fistulous opening in thebase of rectal/anal stump.    . COLOSTOMY  1992   transverse loop colostomy secondary to a stricture  . ESOPHAGOGASTRODUODENOSCOPY  05/2009   SLF: multiple antral erosions, large ulcer at ansatomosis (postsurgical changes at duodenal bulb and second portion of duodenum) BX c/x NSAIDS.  . EUS  10/04/2009   Dr. Estill Bakes dilated CBD and main pancreatic duct.  No pancreatic  . FLEXIBLE SIGMOIDOSCOPY  1988   Dr. Laural Golden- suggested rohn's disease but the biopsies were not collaborative.  [name] FLEXIBLE SIGMOIDOSCOPY [name] 09/08/2016   Procedure: FLEXIBLE SIGMOIDOSCOPY;  Surgeon: Wonda Horner, MD;  Location: [facility] ENDOSCOPY;  Service: Gastroenterology;  Laterality: [name];  . HEMICOLOECTOMY W/ ANASTOMOSIS  1993   R- Dr.DeMason   . [place]   incarcerated periostial hernia with additional surgery in 1999    Prior to Admission medications   Medication Sig Start Date End Date Taking? Authorizing Provider  Adalimumab (HUMIRA PEN) 40 MG/0.8ML PNKT Inject 40 mg into the skin every 14 (fourteen) days. 12/19/16  Yes Annitta Needs, NP  calcium carbonate (OS-CAL) 600 MG TABS tablet Take 1,200 mg by mouth daily with breakfast.   Yes Historical Provider, MD  Ferrous Sulfate 28 MG TABS Take 1 tablet by mouth daily.  Yes Historical Provider, MD  mirtazapine (REMERON) 15 MG tablet Take 1 tablet (15 mg total) by mouth at bedtime. 07/29/16  Yes Alycia Rossetti, MD  Multiple Vitamins-Minerals (CENTRUM SILVER ADULT 50+) TABS Take 1 tablet by mouth daily.   Yes Historical Provider, MD  predniSONE (DELTASONE) 10 MG tablet TAKE 2 TABLETS DAILYX 30 DAYS,1TAB DAILY X 30 DAYS,THEN 1/2 TAB DAILY X 30 DAYS  12/31/16  Yes Annitta Needs, NP  sodium chloride (OCEAN) 0.65 % SOLN nasal spray Place 2 sprays into both nostrils 3 (three) times daily. 12/04/16  Yes Lorella Nimrod, MD  HYDROcodone-acetaminophen (NORCO/VICODIN) 5-325 MG tablet Take 1 tablet by mouth every 6 (six) hours as needed for moderate pain. Patient not taking: Reported on 01/16/2017 11/03/16   Alycia Rossetti, MD    Allergies as of 01/16/2017 - Review Complete 01/16/2017  Allergen Reaction Noted  . Penicillins Hives 01/25/2007    Family History  Problem Relation Age of Onset  . Cancer Father     prostate   . Prostate cancer Father   . Hypertension Sister   . Cancer Sister   . Depression Sister   . Breast cancer Sister   . COPD Sister   . Aneurysm Brother     deceased, brain aneurysm    Social History   Social History  . Marital status: Married    Spouse name: [name]  . Number of children: 45  . Years of education: [name]   Occupational History  . disabled Unemployed   Social History Main Topics  . Smoking status: Former Smoker    Packs/day: 0.50    Years: 2.00    Types: Cigarettes    Quit date: 12/21/2009  . Smokeless tobacco: Never Used     Comment: Quit abut 20 years  . Alcohol use No     Comment: Former drinker  . Drug use: No  . Sexual activity: Yes    Partners: Female   Other Topics Concern  . Not on file   Social History Narrative  . No narrative on file    Review of Systems: See HPI, otherwise negative ROS  Physical Exam: BP 115/75   Pulse 83   Temp 97.6 F (36.4 C) (Oral)   Ht 5' 2"  (1.575 m)   Wt 156 lb 6.4 oz (70.9 kg)   BMI 28.61 kg/m  General:   Alert,  pleasant and cooperative in NAD.  Accompanied by his wife and son. Lungs:  Clear throughout to auscultation.   No wheezes, crackles, or rhonchi. No acute distress. Heart:  Regular rate and rhythm; no murmurs, clicks, rubs,  or gallops. Abdomen: Multiple surgical scars with keloid formation. Multiple abdominal wall hernias present.  Ileostomy appears healthy and functional. Abdomen is soft and nontender without appreciable mass or organomegaly. Pulses:  Normal pulses noted. Extremities:  Without clubbing or edema.   Impression:  Pleasant 61 year old gentleman with long-standing complicated ileocolonic Crohn's disease. On a prednisone taper related to a small bowel instruction last fall. Continues on Humira. Clinically, doing well today. Osteopenia, on vitamin D and calcium supplementation. On B12 supplementation. He needs to be stepped down further on prednisone therapy.  He needs updated imaging of his residual lower GI tract. Urology workup and sigmoidoscopy findings recently reassuring. I suspect imaging changes around the prostate/rectal related to chronic inflammation of Crohn's disease.   Recommendations:  Decrease Prednisone to 5 mg daily x 1 month; then stop  Continue humira, Vit D and Ca  MRE of colon /  small bowel - F/u abnormal rectum on prior CT  OV in 4 months    Notice: This dictation was prepared with Dragon dictation along with smaller phrase technology. Any transcriptional errors that result from this process are unintentional and may not be corrected upon review.

## 2017-01-26 ENCOUNTER — Ambulatory Visit (HOSPITAL_COMMUNITY)
Admission: RE | Admit: 2017-01-26 | Discharge: 2017-01-26 | Disposition: A | Discharge status: Home / Self Care | Payer: Medicare Other | Source: Ambulatory Visit | Attending: Internal Medicine | Admitting: Internal Medicine

## 2017-01-26 DIAGNOSIS — K439 Ventral hernia without obstruction or gangrene: Secondary | ICD-10-CM | POA: Insufficient documentation

## 2017-01-26 DIAGNOSIS — R935 Abnormal findings on diagnostic imaging of other abdominal regions, including retroperitoneum: Secondary | ICD-10-CM | POA: Insufficient documentation

## 2017-01-26 DIAGNOSIS — R222 Localized swelling, mass and lump, trunk: Secondary | ICD-10-CM | POA: Diagnosis not present

## 2017-01-26 DIAGNOSIS — K5090 Crohn's disease, unspecified, without complications: Secondary | ICD-10-CM | POA: Diagnosis not present

## 2017-01-26 LAB — POCT I-STAT CREATININE: Creatinine, Ser: 1.4 mg/dL — ABNORMAL HIGH (ref 0.61–1.24)

## 2017-01-26 MED ORDER — GLUCAGON HCL RDNA (DIAGNOSTIC) 1 MG IJ SOLR
0.5000 mg | Freq: Once | INTRAMUSCULAR | Status: AC
  Administered 2017-01-26: 0.5 mg via INTRAVENOUS

## 2017-01-26 MED ORDER — GLUCAGON HCL RDNA (DIAGNOSTIC) 1 MG IJ SOLR
INTRAMUSCULAR | Status: AC
  Filled 2017-01-26: qty 1

## 2017-01-26 MED ORDER — GLUCAGON HCL RDNA (DIAGNOSTIC) 1 MG IJ SOLR: 0.5000 mg | Freq: Once | INTRAMUSCULAR | Status: DC | PRN

## 2017-01-26 MED ORDER — GADOBENATE DIMEGLUMINE 529 MG/ML IV SOLN
15.0000 mL | Freq: Once | INTRAVENOUS | Status: AC | PRN
  Administered 2017-01-26: 14 mL via INTRAVENOUS

## 2017-01-26 MED ORDER — BARIUM SULFATE 0.1 % PO SUSP
ORAL | Status: AC
  Filled 2017-01-26: qty 3

## 2017-01-28 ENCOUNTER — Other Ambulatory Visit: Payer: Self-pay

## 2017-01-28 DIAGNOSIS — K50919 Crohn's disease, unspecified, with unspecified complications: Secondary | ICD-10-CM

## 2017-01-28 DIAGNOSIS — R1900 Intra-abdominal and pelvic swelling, mass and lump, unspecified site: Secondary | ICD-10-CM

## 2017-02-05 ENCOUNTER — Ambulatory Visit (HOSPITAL_COMMUNITY)
Admission: RE | Admit: 2017-02-05 | Discharge: 2017-02-05 | Disposition: A | Discharge status: Home / Self Care | Payer: Medicare Other | Source: Ambulatory Visit | Attending: Internal Medicine | Admitting: Internal Medicine

## 2017-02-05 DIAGNOSIS — K5090 Crohn's disease, unspecified, without complications: Secondary | ICD-10-CM | POA: Insufficient documentation

## 2017-02-05 DIAGNOSIS — R935 Abnormal findings on diagnostic imaging of other abdominal regions, including retroperitoneum: Secondary | ICD-10-CM | POA: Diagnosis not present

## 2017-02-05 DIAGNOSIS — R1900 Intra-abdominal and pelvic swelling, mass and lump, unspecified site: Secondary | ICD-10-CM

## 2017-02-05 DIAGNOSIS — K439 Ventral hernia without obstruction or gangrene: Secondary | ICD-10-CM | POA: Insufficient documentation

## 2017-02-05 MED ORDER — IOPAMIDOL (ISOVUE-300) INJECTION 61%
100.0000 mL | Freq: Once | INTRAVENOUS | Status: AC | PRN
  Administered 2017-02-05: 100 mL via INTRAVENOUS

## 2017-02-06 ENCOUNTER — Other Ambulatory Visit: Payer: Self-pay | Admitting: *Deleted

## 2017-02-06 ENCOUNTER — Telehealth: Payer: Self-pay | Admitting: Internal Medicine

## 2017-02-06 ENCOUNTER — Telehealth: Payer: Self-pay | Admitting: Family Medicine

## 2017-02-06 DIAGNOSIS — R972 Elevated prostate specific antigen [PSA]: Secondary | ICD-10-CM

## 2017-02-06 DIAGNOSIS — R1900 Intra-abdominal and pelvic swelling, mass and lump, unspecified site: Secondary | ICD-10-CM

## 2017-02-06 NOTE — Telephone Encounter (Signed)
Call placed to patient and patient wife [name] made aware.   Future lab orders placed.   Referral orders placed.

## 2017-02-06 NOTE — Telephone Encounter (Signed)
Call pt we received info from Dr. Gala Romney. His CT scan shows a mass in his pelvis close to prostate. He needs a PSA level and CMET/CBC done by next week. We are going to send him to a urologist, to look at this mass and get biopsy to see what is going on    Place orders for PSA, CMET CBC  Urology- pelvic mass, elevated PSA     Send CT abd pelvis, labs from Dr. Watt Climes with PSA and we will send the labs he will get above

## 2017-02-06 NOTE — Telephone Encounter (Signed)
Pt and his wife called to say that Dr [name] wanted him to have an earlier appointment than what was scheduled. I told her that patient wasn't scheduled an OV and she said it was for a CT. Please advise and call her back at [phone]

## 2017-02-09 NOTE — Telephone Encounter (Signed)
LMOVM for pt's wife to call office (pt had CT done 02/05/17).

## 2017-02-09 NOTE — Telephone Encounter (Signed)
I spoke with the pts wife and she is aware of what is going on. See ct results.

## 2017-02-11 ENCOUNTER — Ambulatory Visit (INDEPENDENT_AMBULATORY_CARE_PROVIDER_SITE_OTHER): Payer: Medicare Other | Admitting: Family Medicine

## 2017-02-11 ENCOUNTER — Encounter: Payer: Self-pay | Admitting: Family Medicine

## 2017-02-11 DIAGNOSIS — R972 Elevated prostate specific antigen [PSA]: Secondary | ICD-10-CM | POA: Diagnosis not present

## 2017-02-11 DIAGNOSIS — R1900 Intra-abdominal and pelvic swelling, mass and lump, unspecified site: Secondary | ICD-10-CM

## 2017-02-11 MED ORDER — HYDROCODONE-ACETAMINOPHEN 5-325 MG PO TABS: 1.0000 | ORAL_TABLET | Freq: Four times a day (QID) | ORAL | 0 refills | Status: DC | PRN

## 2017-02-11 NOTE — Patient Instructions (Signed)
F/u AS PREVIOUS  Urology- referral We will call with results

## 2017-02-11 NOTE — Progress Notes (Signed)
   Subjective:    Patient ID: Daniel Howard, male    DOB: [birth year], 61 y.o.   MRN: [id number]  Patient presents for Referral (requesting urology referral for possible prostate growth)  Pt here for follow up, was seen by GI who did CT abd/pelvvis to follow up his Crohns he was found to have pelvic mass in close proximation to his prostate, he had elevated PSA of 7 back in Sept, was seen by urology who planned to monitor. After review with radiologist biopsy is recommended, he also needs repeat PSA and renal function check. He states he feels well, has no urinary problems.     Review Of Systems:  GEN- denies fatigue, fever, weight loss,weakness, recent illness HEENT- denies eye drainage, change in vision, nasal discharge, CVS- denies chest pain, palpitations RESP- denies SOB, cough, wheeze ABD- denies N/V, change in stools, abd pain GU- denies dysuria, hematuria, dribbling, incontinence MSK- denies joint pain, muscle aches, injury Neuro- denies headache, dizziness, syncope, seizure activity       Objective:    BP 112/76   Pulse 100   Temp 99 F (37.2 C) (Oral)   Resp 14   Ht 5' 2"  (1.575 m)   Wt 156 lb (70.8 kg)   SpO2 98%   BMI 28.53 kg/m  GEN- NAD, alert and oriented x3 CVS- RRR, no murmur RESP-CTAB ABD-NABS,soft,NT,ND, colostomy EXT- No edema         Assessment & Plan:      Problem List Items Addressed This Visit    None    Visit Diagnoses    Elevated PSA measurement       Refer back to urology, obtain PSA, metabolic panel, per discussion want Urology involved for biopsy to figure out best way to obtain sample. Discussed CT with patient and wife. They were given copies of CT scan ALso refilled his pain medication    Pelvic mass in male          Note: This dictation was prepared with Dragon dictation along with smaller phrase technology. Any transcriptional errors that result from this process are unintentional.

## 2017-02-12 ENCOUNTER — Encounter: Payer: Self-pay | Admitting: Family Medicine

## 2017-02-12 LAB — COMPLETE METABOLIC PANEL WITH GFR
ALT: 11 U/L (ref 9–46)
AST: 19 U/L (ref 10–35)
Albumin: 3.8 g/dL (ref 3.6–5.1)
Alkaline Phosphatase: 54 U/L (ref 40–115)
BILIRUBIN TOTAL: 0.6 mg/dL (ref 0.2–1.2)
BUN: 15 mg/dL (ref 7–25)
CHLORIDE: 103 mmol/L (ref 98–110)
CO2: 23 mmol/L (ref 20–31)
Calcium: 9.1 mg/dL (ref 8.6–10.3)
Creat: 1.46 mg/dL — ABNORMAL HIGH (ref 0.70–1.33)
GFR, EST NON AFRICAN AMERICAN: 52 mL/min — AB (ref >=60)
GFR, Est African American: 60 mL/min (ref >=60)
GLUCOSE: 180 mg/dL — AB (ref 70–99)
Potassium: 4 mmol/L (ref 3.5–5.3)
SODIUM: 139 mmol/L (ref 135–146)
TOTAL PROTEIN: 7.4 g/dL (ref 6.1–8.1)

## 2017-02-12 LAB — CBC WITH DIFFERENTIAL/PLATELET
BASOS ABS: 0 {cells}/uL (ref 0–200)
BASOS PCT: 0 %
EOS PCT: 0 %
Eosinophils Absolute: 0 cells/uL — ABNORMAL LOW (ref 15–500)
HCT: 42.8 % (ref 38.5–50.0)
Hemoglobin: 14 g/dL (ref 13.0–17.0)
Lymphocytes Relative: 23 %
Lymphs Abs: 1679 cells/uL (ref 850–3900)
MCH: 31.8 pg (ref 27.0–33.0)
MCHC: 32.7 g/dL (ref 32.0–36.0)
MCV: 97.3 fL (ref 80.0–100.0)
MPV: 9.3 fL (ref 7.5–12.5)
Monocytes Absolute: 219 cells/uL (ref 200–950)
Monocytes Relative: 3 %
NEUTROS ABS: 5402 {cells}/uL (ref 1500–7800)
Neutrophils Relative %: 74 %
PLATELETS: 332 10*3/uL (ref 140–400)
RBC: 4.4 MIL/uL (ref 4.20–5.80)
RDW: 12.7 % (ref 11.0–15.0)
WBC: 7.3 10*3/uL (ref 3.8–10.8)

## 2017-02-12 LAB — PSA: PSA: 31.3 ng/mL — AB (ref <=4.0)

## 2017-02-13 ENCOUNTER — Telehealth: Payer: Self-pay | Admitting: Family Medicine

## 2017-02-13 NOTE — Telephone Encounter (Signed)
-----   Message from Olena Mater, LPN sent at 02/07/8374 11:28 AM EST ----- Regarding: RE: Check on urology referral needs mass biopsied  Pt was seen in 01/13/17 and has follow up on 03/02/17.  I did call [facility] and have faxed recent CT and labs to Dr Pilar Jarvis.  They will call patient to update appt. ----- Message ----- From: Alycia Rossetti, MD Sent: 02/11/2017   4:32 PM To: Olena Mater, LPN Subject: Check on urology referral needs mass biopsie#

## 2017-02-24 DIAGNOSIS — H2513 Age-related nuclear cataract, bilateral: Secondary | ICD-10-CM | POA: Diagnosis not present

## 2017-03-02 DIAGNOSIS — R972 Elevated prostate specific antigen [PSA]: Secondary | ICD-10-CM | POA: Diagnosis not present

## 2017-03-04 ENCOUNTER — Encounter: Payer: Self-pay | Admitting: Family Medicine

## 2017-03-04 ENCOUNTER — Ambulatory Visit (INDEPENDENT_AMBULATORY_CARE_PROVIDER_SITE_OTHER): Payer: Medicare Other | Admitting: Family Medicine

## 2017-03-04 VITALS — BP 106/68 | HR 84 | Temp 98.3°F | Resp 16 | Ht 62.0 in | Wt 154.0 lb

## 2017-03-04 DIAGNOSIS — R972 Elevated prostate specific antigen [PSA]: Secondary | ICD-10-CM | POA: Diagnosis not present

## 2017-03-04 DIAGNOSIS — R1900 Intra-abdominal and pelvic swelling, mass and lump, unspecified site: Secondary | ICD-10-CM | POA: Diagnosis not present

## 2017-03-04 MED ORDER — HYDROCODONE-ACETAMINOPHEN 5-325 MG PO TABS: 1.0000 | ORAL_TABLET | Freq: Four times a day (QID) | ORAL | 0 refills | Status: DC | PRN

## 2017-03-04 NOTE — Progress Notes (Signed)
   Subjective:    Patient ID: Daniel Howard, male    DOB: 08/26/1956, 61 y.o.   MRN: [id number]  Patient presents for Follow-up (is not fasting)  Pt here for f/u, at least visit sent to urology for increasing PSA in setting of right pelvic mass with LAD. He was seen by urology 2 days ago but he is confused on who we going his biopsy what exactly is going on. During that visit I called urology and spoke with Dr. Pilar Jarvis is very difficult to tease out whether this mass is related to the prostate as is not located within the prostate region or if this is colorectal. His PSA has been going up it is now 7 previously at 7 this could just be more of an inflammatory effect causing the PSA to go up. The only thing that can be done is to obtain tissue sampling. He is scheduled with interventional radiology for biopsy of the mass as well as lymph node. I discussed this with the patient as well as his wife. I did refill his pain medication today as well. He is not having any pelvic discomfort no abdominal pain no urinary symptoms.    Review Of Systems:  GEN- denies fatigue, fever, weight loss,weakness, recent illness HEENT- denies eye drainage, change in vision, nasal discharge, CVS- denies chest pain, palpitations RESP- denies SOB, cough, wheeze ABD- denies N/V, change in stools, abd pain GU- denies dysuria, hematuria, dribbling, incontinence MSK- denies joint pain, muscle aches, injury Neuro- denies headache, dizziness, syncope, seizure activity       Objective:    BP 106/68   Pulse 84   Temp 98.3 F (36.8 C) (Oral)   Resp 16   Ht 5' 2"  (1.575 m)   Wt 154 lb (69.9 kg)   SpO2 99%   BMI 28.17 kg/m  GEN- NAD, alert and oriented x3        Assessment & Plan:      Problem List Items Addressed This Visit    Pelvic mass in male - Primary    Still unknown cause of the pelvic mass that elevated PSA whether this is colorectal versus prostate pathology. Biopsy to be done. I did refill his  pain medication today. He will be referred to oncology based on results       Elevated PSA      Note: This dictation was prepared with Dragon dictation along with smaller phrase technology. Any transcriptional errors that result from this process are unintentional.

## 2017-03-04 NOTE — Patient Instructions (Signed)
You will be contacted by Interventional Radiology for Biopsy of mass and lymph nodes Do not take any aspirin/ALEVE/IBUPROFEN containing medications  Okay to take your pain medication F/U pending results

## 2017-03-04 NOTE — Assessment & Plan Note (Signed)
Still unknown cause of the pelvic mass that elevated PSA whether this is colorectal versus prostate pathology. Biopsy to be done. I did refill his pain medication today. He will be referred to oncology based on results

## 2017-03-05 ENCOUNTER — Other Ambulatory Visit: Payer: Self-pay | Admitting: Urology

## 2017-03-05 DIAGNOSIS — R1900 Intra-abdominal and pelvic swelling, mass and lump, unspecified site: Secondary | ICD-10-CM

## 2017-03-06 ENCOUNTER — Encounter: Payer: Self-pay | Admitting: *Deleted

## 2017-03-12 ENCOUNTER — Other Ambulatory Visit: Payer: Self-pay | Admitting: Radiology

## 2017-03-13 ENCOUNTER — Other Ambulatory Visit: Payer: Self-pay | Admitting: Radiology

## 2017-03-16 ENCOUNTER — Ambulatory Visit (HOSPITAL_COMMUNITY)
Admission: RE | Admit: 2017-03-16 | Discharge: 2017-03-16 | Disposition: A | Discharge status: Home / Self Care | Payer: Medicare Other | Source: Ambulatory Visit | Attending: Urology | Admitting: Urology

## 2017-03-16 ENCOUNTER — Encounter (HOSPITAL_COMMUNITY): Payer: Self-pay

## 2017-03-16 DIAGNOSIS — C61 Malignant neoplasm of prostate: Secondary | ICD-10-CM | POA: Diagnosis not present

## 2017-03-16 DIAGNOSIS — R1909 Other intra-abdominal and pelvic swelling, mass and lump: Secondary | ICD-10-CM | POA: Diagnosis not present

## 2017-03-16 DIAGNOSIS — Z880 Allergy status to penicillin: Secondary | ICD-10-CM | POA: Diagnosis not present

## 2017-03-16 DIAGNOSIS — Z9049 Acquired absence of other specified parts of digestive tract: Secondary | ICD-10-CM | POA: Diagnosis not present

## 2017-03-16 DIAGNOSIS — Z933 Colostomy status: Secondary | ICD-10-CM | POA: Insufficient documentation

## 2017-03-16 DIAGNOSIS — R1900 Intra-abdominal and pelvic swelling, mass and lump, unspecified site: Secondary | ICD-10-CM | POA: Diagnosis present

## 2017-03-16 DIAGNOSIS — Z79899 Other long term (current) drug therapy: Secondary | ICD-10-CM | POA: Insufficient documentation

## 2017-03-16 DIAGNOSIS — K5010 Crohn's disease of large intestine without complications: Secondary | ICD-10-CM | POA: Insufficient documentation

## 2017-03-16 DIAGNOSIS — C7989 Secondary malignant neoplasm of other specified sites: Secondary | ICD-10-CM | POA: Insufficient documentation

## 2017-03-16 DIAGNOSIS — C801 Malignant (primary) neoplasm, unspecified: Secondary | ICD-10-CM | POA: Diagnosis not present

## 2017-03-16 DIAGNOSIS — Z87891 Personal history of nicotine dependence: Secondary | ICD-10-CM | POA: Insufficient documentation

## 2017-03-16 LAB — CBC WITH DIFFERENTIAL/PLATELET
BASOS ABS: 0 10*3/uL (ref 0.0–0.1)
BASOS PCT: 1 %
EOS ABS: 0.2 10*3/uL (ref 0.0–0.7)
Eosinophils Relative: 3 %
HCT: 40.5 % (ref 39.0–52.0)
Hemoglobin: 13.7 g/dL (ref 13.0–17.0)
LYMPHS PCT: 32 %
Lymphs Abs: 2.4 10*3/uL (ref 0.7–4.0)
MCH: 32 pg (ref 26.0–34.0)
MCHC: 33.8 g/dL (ref 30.0–36.0)
MCV: 94.6 fL (ref 78.0–100.0)
MONO ABS: 0.4 10*3/uL (ref 0.1–1.0)
Monocytes Relative: 6 %
NEUTROS ABS: 4.4 10*3/uL (ref 1.7–7.7)
NEUTROS PCT: 58 %
PLATELETS: 277 10*3/uL (ref 150–400)
RBC: 4.28 MIL/uL (ref 4.22–5.81)
RDW: 12.5 % (ref 11.5–15.5)
WBC: 7.4 10*3/uL (ref 4.0–10.5)

## 2017-03-16 LAB — PROTIME-INR
INR: 1.02
PROTHROMBIN TIME: 13.4 s (ref 11.4–15.2)

## 2017-03-16 LAB — BASIC METABOLIC PANEL
ANION GAP: 7 (ref 5–15)
BUN: 18 mg/dL (ref 6–20)
CO2: 27 mmol/L (ref 22–32)
Calcium: 9.1 mg/dL (ref 8.9–10.3)
Chloride: 106 mmol/L (ref 101–111)
Creatinine, Ser: 1.36 mg/dL — ABNORMAL HIGH (ref 0.61–1.24)
GFR calc Af Amer: >60 mL/min (ref >60)
GFR, EST NON AFRICAN AMERICAN: 55 mL/min — AB (ref >60)
GLUCOSE: 90 mg/dL (ref 65–99)
POTASSIUM: 3.7 mmol/L (ref 3.5–5.1)
Sodium: 140 mmol/L (ref 135–145)

## 2017-03-16 MED ORDER — FENTANYL CITRATE (PF) 100 MCG/2ML IJ SOLN
INTRAMUSCULAR | Status: AC
  Filled 2017-03-16: qty 6

## 2017-03-16 MED ORDER — FLUMAZENIL 0.5 MG/5ML IV SOLN
INTRAVENOUS | Status: AC
  Filled 2017-03-16: qty 5

## 2017-03-16 MED ORDER — NALOXONE HCL 0.4 MG/ML IJ SOLN
INTRAMUSCULAR | Status: AC
  Filled 2017-03-16: qty 1

## 2017-03-16 MED ORDER — MIDAZOLAM HCL 2 MG/2ML IJ SOLN
INTRAMUSCULAR | Status: AC
  Filled 2017-03-16: qty 6

## 2017-03-16 MED ORDER — MIDAZOLAM HCL 2 MG/2ML IJ SOLN
INTRAMUSCULAR | Status: AC | PRN
  Administered 2017-03-16 (×2): 1 mg via INTRAVENOUS

## 2017-03-16 MED ORDER — SODIUM CHLORIDE 0.9 % IV SOLN
INTRAVENOUS | Status: DC
  Administered 2017-03-16: 08:00:00 via INTRAVENOUS

## 2017-03-16 MED ORDER — LIDOCAINE-EPINEPHRINE (PF) 1 %-1:200000 IJ SOLN
10.0000 mL | Freq: Once | INTRAMUSCULAR | Status: AC
  Administered 2017-03-16: 10 mL
  Filled 2017-03-16: qty 10

## 2017-03-16 MED ORDER — FENTANYL CITRATE (PF) 100 MCG/2ML IJ SOLN
INTRAMUSCULAR | Status: AC | PRN
  Administered 2017-03-16 (×2): 50 ug via INTRAVENOUS

## 2017-03-16 NOTE — Procedures (Signed)
Pre procedural Dx: Pelvic mass  Post procedural Dx: Same  Technically successful CT guided biopsy of Indeterminate right pelvic side wall mass.   EBL: None.   Complications: None immediate.   Ronny Bacon, MD Pager #: [phone]

## 2017-03-16 NOTE — Discharge Instructions (Signed)
Needle Biopsy, Care After These instructions give you information about caring for yourself after your procedure. Your doctor may also give you more specific instructions. Call your doctor if you have any problems or questions after your procedure. Follow these instructions at home:  Rest as told by your doctor.  Take medicines only as told by your doctor.  There are many different ways to close and cover the biopsy site, including stitches (sutures), skin glue, and adhesive strips. Follow instructions from your doctor about:  How to take care of your biopsy site.  When and how you should change your bandage (dressing).  When you should remove your dressing.  Removing whatever was used to close your biopsy site.  Check your biopsy site every day for signs of infection. Watch for:  Redness, swelling, or pain.  Fluid, blood, or pus. Contact a doctor if:  You have a fever.  You have redness, swelling, or pain at the biopsy site, and it lasts longer than a few days.  You have fluid, blood, or pus coming from the biopsy site.  You feel sick to your stomach (nauseous).  You throw up (vomit). Get help right away if:  You are short of breath.  You have trouble breathing.  Your chest hurts.  You feel dizzy or you pass out (faint).  You have bleeding that does not stop with pressure or a bandage.  You cough up blood.  Your belly (abdomen) hurts. This information is not intended to replace advice given to you by your health care provider. Make sure you discuss any questions you have with your health care provider. Document Released: 11/20/2008 Document Revised: 05/15/2016 Document Reviewed: 12/04/2014 Elsevier Interactive Patient Education  2017 [employer].   Moderate Conscious Sedation, Adult, Care After These instructions provide you with information about caring for yourself after your procedure. Your health care provider may also give you more specific instructions.  Your treatment has been planned according to current medical practices, but problems sometimes occur. Call your health care provider if you have any problems or questions after your procedure. What can I expect after the procedure? After your procedure, it is common:  To feel sleepy for several hours.  To feel clumsy and have poor balance for several hours.  To have poor judgment for several hours.  To vomit if you eat too soon. Follow these instructions at home: For at least 24 hours after the procedure:    Do not:  Participate in activities where you could fall or become injured.  Drive.  Use heavy machinery.  Drink alcohol.  Take sleeping pills or medicines that cause drowsiness.  Make important decisions or sign legal documents.  Take care of children on your own.  Rest. Eating and drinking   Follow the diet recommended by your health care provider.  If you vomit:  Drink water, juice, or soup when you can drink without vomiting.  Make sure you have little or no nausea before eating solid foods. General instructions   Have a responsible adult stay with you until you are awake and alert.  Take over-the-counter and prescription medicines only as told by your health care provider.  If you smoke, do not smoke without supervision.  Keep all follow-up visits as told by your health care provider. This is important. Contact a health care provider if:  You keep feeling nauseous or you keep vomiting.  You feel light-headed.  You develop a rash.  You have a fever. Get help right  away if:  You have trouble breathing. This information is not intended to replace advice given to you by your health care provider. Make sure you discuss any questions you have with your health care provider. Document Released: 09/28/2013 Document Revised: 05/12/2016 Document Reviewed: 03/29/2016 Elsevier Interactive Patient Education  2017 [employer].

## 2017-03-16 NOTE — H&P (Signed)
Chief Complaint: Patient was seen in consultation today for pelvic mass  Referring Physician(s):  Dr. Johnsie Cancel  Supervising Physician: Sandi Mariscal  Patient Status: [facility] - Out-pt  History of Present Illness: Daniel Howard is a 61 y.o. male with past medical history of Crohn's s/p colectomy for small bowel obstruction, duodenal ulcer, and prior trach after complex hernia repair who presents with complaint of pelvic mass with lymphadenopathy.   CT Abd/Pelvis 02/05/17: 1. Enlarging very irregular heterogeneous right-sided pelvic mass and pelvic lymphadenopathy. Recommend biopsy. 2. Fairly marked inflammation of the distal and terminal ileum consistent with active Crohn's disease. No obvious abscess or fistula. 3. Multiple abdominal wall hernias without incarceration or obstruction. 4. Stable common bile duct dilatation and mild pancreatic ductal dilatation. 5. 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  IR consulted for lymph node biopsy at the request of Dr. Johnsie Cancel.  Case reviewed by Dr. Barbie Banner who approves patient for procedure.   He has been NPO.  He does not take blood thinners.   Patient has been in his usual state of health.   Past Medical History:  Diagnosis Date  . Abnormal finding of biliary tract    MRCP shows pancreatic/biliary tract dilation. EUS 2010 confirmed dilation but no chronic pancreaitis or mass. Vascular ectasia crimpoing distal CBD.   [name] Anxiety   . 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 Duodenal ulcer 2010   nsaids  . Low back pain   . Peristomal hernia   . Small bowel  obstruction   . Spigelian hernia    bilateral    Past Surgical History:  Procedure Laterality Date  . APPENDECTOMY  1992   at time of exp laparotomy at which time he was noted to have fistulizing Crohn's rather than UC  . COLONOSCOPY [name] 08/23/2014   HEN:IDPOEUM proctoscopy with possible fistulous opening in thebase of rectal/anal stump.    . COLOSTOMY  1992   transverse loop colostomy secondary to a stricture  . ESOPHAGOGASTRODUODENOSCOPY  05/2009   SLF: multiple antral erosions, large ulcer at ansatomosis (postsurgical changes at duodenal bulb and second portion of duodenum) BX c/x NSAIDS.  . EUS  10/04/2009   Dr. Estill Bakes dilated CBD and main pancreatic duct.  No pancreatic  . FLEXIBLE SIGMOIDOSCOPY  1988   Dr. Laural Golden- suggested rohn's disease but the biopsies were not collaborative.  [name] FLEXIBLE SIGMOIDOSCOPY [name] 09/08/2016   Procedure: FLEXIBLE SIGMOIDOSCOPY;  Surgeon: Wonda Horner, MD;  Location: [facility] END[facility]COPY;  Service: Gastroenterology;  Laterality: [name];  . HEMICOLOECTOMY W/ ANASTOMOSIS  1993   R- Dr.DeMason   . [place]   incarcerated periostial hernia with additional surgery in 1999    Allergies: Penicillins  Medications: Prior to Admission medications   Medication Sig Start Date End Date Taking? Authorizing Provider  calcium carbonate (OS-CAL) 600 MG TABS tablet Take 1,200 mg by mouth daily with breakfast.   Yes Historical Provider, MD  Ferrous Sulfate 28 MG TABS Take 1 tablet by mouth daily.   Yes Historical Provider, MD  HYDROcodone-acetaminophen (NORCO/VICODIN) 5-325 MG tablet Take 1 tablet by mouth every 6 (six) hours as needed for moderate pain. 03/04/17  Yes  Alycia Rossetti, MD  mirtazapine (REMERON) 15 MG tablet Take 1 tablet (15 mg total) by mouth at bedtime. 07/29/16  Yes Alycia Rossetti, MD  Multiple Vitamins-Minerals (CENTRUM SILVER ADULT 50+) TABS Take 1 tablet by mouth daily.   Yes Historical Provider, MD  Adalimumab (HUMIRA PEN) 40 MG/0.8ML  PNKT Inject 40 mg into the skin every 14 (fourteen) days. 12/19/16   Annitta Needs, NP  predniSONE (DELTASONE) 10 MG tablet TAKE 2 TABLETS DAILYX 30 DAYS,1TAB DAILY X 30 DAYS,THEN 1/2 TAB DAILY X 30 DAYS 12/31/16   Annitta Needs, NP  sodium chloride (OCEAN) 0.65 % SOLN nasal spray Place 2 sprays into both nostrils 3 (three) times daily. 12/04/16   Lorella Nimrod, MD     Family History  Problem Relation Age of Onset  . Cancer Father     prostate   . Prostate cancer Father   . Hypertension Sister   . Cancer Sister   . Depression Sister   . Breast cancer Sister   . COPD Sister   . Aneurysm Brother     deceased, brain aneurysm    Social History   Social History  . Marital status: Married    Spouse name: [name]  . Number of children: 83  . Years of education: [name]   Occupational History  . disabled Unemployed   Social History Main Topics  . Smoking status: Former Smoker    Packs/day: 0.50    Years: 2.00    Types: Cigarettes    Quit date: 12/21/2009  . Smokeless tobacco: Never Used     Comment: Quit abut 20 years  . Alcohol use No     Comment: Former drinker  . Drug use: No  . Sexual activity: Yes    Partners: Female   Other Topics Concern  . None   Social History Narrative  . None    Review of Systems  Constitutional: Negative for fatigue and fever.  Respiratory: Negative for cough.   Cardiovascular: Negative for chest pain.  Gastrointestinal: Negative for abdominal pain.  Psychiatric/Behavioral: Negative for behavioral problems and confusion.    Vital Signs: BP 108/74 (BP Location: Right Arm)   Pulse 76   Temp 97.9 F (36.6 C) (Oral)   Resp 16   SpO2 99%   Physical Exam  Constitutional: He is oriented to person, place, and time. He appears well-developed.  Cardiovascular: Normal rate, regular rhythm and normal heart sounds.   Pulmonary/Chest: Effort normal and breath sounds normal.  Scar from prior trach  Abdominal: Soft.  Colostomy in place  Neurological:  He is alert and oriented to person, place, and time.  Skin: Skin is warm and dry.  Psychiatric: He has a normal mood and affect. His behavior is normal. Judgment and thought content normal.  Nursing note and vitals reviewed.   Mallampati Score:  MD Evaluation Airway: WNL Heart: WNL Abdomen: WNL Chest/ Lungs: WNL ASA  Classification: 3 Mallampati/Airway Score: Two  Imaging: No results found.  Labs:  CBC:  Recent Labs  11/03/16 1047 12/04/16 1029 02/11/17 1640 03/16/17 0732  WBC 8.1 9.5 7.3 7.4  HGB 14.0 13.5 14.0 13.7  HCT 42.9 39.9 42.8 40.5  PLT 290 256 332 277    COAGS:  Recent Labs  03/16/17 0732  INR 1.02    BMP:  Recent Labs  09/07/16 0558 09/08/16 1006 11/03/16 1047 01/26/17 1036 02/11/17 1640 03/16/17 0732  NA 138 138 140  --  139 140  K 4.7 4.2 4.9  --  4.0 3.7  CL 105 104 104  --  103 106  CO2 26 29 29   --  23 27  GLUCOSE 96 91 92  --  180* 90  BUN 9 7 17   --  15 18  CALCIUM 8.9 8.8* 9.5  --  9.1 9.1  CREATININE 1.14 1.14 1.23 1.40* 1.46* 1.36*  GFRNONAA >60 >60  --   --  52* 55*  GFRAA >60 >60  --   --  60 >60    LIVER FUNCTION TESTS:  Recent Labs  09/04/16 0953 09/05/16 0748 11/03/16 1047 02/11/17 1640  BILITOT 1.3* 0.7 0.6 0.6  AST 19 16 20 19   ALT 12* 10* 13 11  ALKPHOS 47 48 44 54  PROT 7.0 7.3 7.1 7.4  ALBUMIN 3.0* 2.9* 3.8 3.8    TUMOR MARKERS:  Recent Labs  09/05/16 0748  CEA 2.2    Assessment and Plan: Pelvic Mass Patient with history of Crohn's colitis and duodenal ulcer who presents with elevated PSA, lymphadenopathy, and pelvic mass identified by CT scan.  IR consulted for lymph node biopsy at the request of Dr. Pilar Jarvis.  Case approved by Dr. Barbie Banner.  Patient presents for procedure today.  He has been NPO, does not take blood thinners, and presents in his usual state of health.  Risks and benefits discussed with the patient including, but not limited to bleeding, infection, damage to adjacent structures or  low yield requiring additional tests. All of the patient's questions were answered, patient is agreeable to proceed. Consent signed and in chart.  Thank you for this interesting consult.  I greatly enjoyed meeting Daniel Howard and look forward to participating in their care.  A copy of this report was sent to the requesting provider on this date.  Electronically Signed: Docia Barrier 03/16/2017, 8:46 AM   I spent a total of  30 Minutes   in face to face in clinical consultation, greater than 50% of which was counseling/coordinating care for pelvic mass

## 2017-03-23 DIAGNOSIS — C61 Malignant neoplasm of prostate: Secondary | ICD-10-CM | POA: Diagnosis not present

## 2017-03-24 ENCOUNTER — Other Ambulatory Visit: Payer: Self-pay | Admitting: Urology

## 2017-03-24 DIAGNOSIS — C61 Malignant neoplasm of prostate: Secondary | ICD-10-CM

## 2017-03-26 ENCOUNTER — Encounter: Payer: Self-pay | Admitting: Internal Medicine

## 2017-03-27 ENCOUNTER — Encounter: Payer: Self-pay | Admitting: Oncology

## 2017-03-27 ENCOUNTER — Telehealth: Payer: Self-pay | Admitting: Oncology

## 2017-03-27 NOTE — Telephone Encounter (Signed)
Appt has been schedule for the pt to see Dr. Alen Blew. Lft a vm w/appt date and time. Letter mailed.

## 2017-04-01 ENCOUNTER — Encounter (HOSPITAL_COMMUNITY)
Admission: RE | Admit: 2017-04-01 | Discharge: 2017-04-01 | Disposition: A | Discharge status: Home / Self Care | Payer: Medicare Other | Source: Ambulatory Visit | Attending: Urology | Admitting: Urology

## 2017-04-01 DIAGNOSIS — C61 Malignant neoplasm of prostate: Secondary | ICD-10-CM

## 2017-04-01 DIAGNOSIS — R972 Elevated prostate specific antigen [PSA]: Secondary | ICD-10-CM | POA: Diagnosis not present

## 2017-04-01 MED ORDER — TECHNETIUM TC 99M MEDRONATE IV KIT
20.1000 | PACK | Freq: Once | INTRAVENOUS | Status: AC | PRN
  Administered 2017-04-01: 20.1 via INTRAVENOUS

## 2017-04-02 ENCOUNTER — Telehealth: Payer: Self-pay | Admitting: Oncology

## 2017-04-02 ENCOUNTER — Other Ambulatory Visit: Payer: Self-pay | Admitting: Physician Assistant

## 2017-04-02 ENCOUNTER — Ambulatory Visit (HOSPITAL_BASED_OUTPATIENT_CLINIC_OR_DEPARTMENT_OTHER): Payer: Medicare Other | Admitting: Oncology

## 2017-04-02 VITALS — BP 108/70 | HR 74 | Temp 97.8°F | Resp 18 | Ht 62.0 in | Wt 151.1 lb

## 2017-04-02 DIAGNOSIS — C61 Malignant neoplasm of prostate: Secondary | ICD-10-CM

## 2017-04-02 DIAGNOSIS — C7951 Secondary malignant neoplasm of bone: Secondary | ICD-10-CM

## 2017-04-02 DIAGNOSIS — Z7189 Other specified counseling: Secondary | ICD-10-CM | POA: Insufficient documentation

## 2017-04-02 MED ORDER — LIDOCAINE-PRILOCAINE 2.5-2.5 % EX CREA: 1.0000 "application " | TOPICAL_CREAM | CUTANEOUS | 0 refills | Status: DC | PRN

## 2017-04-02 MED ORDER — PROCHLORPERAZINE MALEATE 10 MG PO TABS: 10.0000 mg | ORAL_TABLET | Freq: Four times a day (QID) | ORAL | 0 refills | Status: DC | PRN

## 2017-04-02 NOTE — Telephone Encounter (Signed)
Appointments scheduled per 4.12.18 LOS. Patient given AVS report and calendars with future scheduled appointments.

## 2017-04-02 NOTE — Progress Notes (Signed)
Reason for Referral: Prostate cancer.   HPI: 61 year old gentleman currently of [place] where he lived the majority of his life. His gentleman with history of Crohn's disease with multiple abdominal surgeries in the past.  He is a disease appears to be relatively stable and his last exacerbation was around September 2017. At that time he was found to have a small bowel obstruction and was also noted to have an elevated PSA. His PSA was 7.08 in September 2017 and was up to 31.3 in February 2018. Imaging studies in February 2018 showed a enlarging right-sided pelvic mass and pelvic adenopathy arising from the prostate. He underwent a biopsy on 03/16/2017 which confirmed the presence of prostate cancer. Bone scan obtained on 04/01/2017 showed high volume metastasis in his bones. He was evaluated by Dr. Laurier Nancy and was started on androgen deprivation in April 2018. He does not report any specific urological symptoms at this time. He does not report any pelvic pain or discomfort. He does not have any back pain or pathological fractures. He denied any frequency, urgency or hesitancy. He denied any recent diarrhea or exacerbation of his Crohn's disease.  He does not report any headaches, blurry vision, syncope or seizures. He does not report any fevers, chills, sweats or weight loss. He does not report any chest pain, palpitation, orthopnea or leg edema. He does not report any cough, wheezing or hemoptysis. He does not report any nausea, vomiting or abdominal pain. He does not report any frequency urgency or hesitancy. He is not report any skeletal complaints of arthralgias or myalgias. Remaining review of systems unremarkable.   Past Medical History:  Diagnosis Date  . Abnormal finding of biliary tract    MRCP shows pancreatic/biliary tract dilation. EUS 2010 confirmed dilation but no chronic pancreaitis or mass. Vascular ectasia crimpoing distal CBD.   [name] Anxiety   . 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 Duodenal ulcer 2010   nsaids  . Low back pain   . Peristomal hernia   . Small bowel obstruction   . Spigelian hernia    bilateral  :  Past Surgical History:  Procedure Laterality Date  . APPENDECTOMY  1992   at time of exp laparotomy at which time he was noted to have fistulizing Crohn's rather than UC  . COLONOSCOPY [name] 08/23/2014   NOM:VEHMCNO proctoscopy with possible fistulous opening in thebase of rectal/anal stump.    . COLOSTOMY  1992   transverse loop colostomy secondary to a stricture  . ESOPHAGOGASTRODUODENOSCOPY  05/2009   SLF: multiple antral erosions, large ulcer at ansatomosis (postsurgical changes at duodenal bulb and second portion of duodenum) BX c/x NSAIDS.  . EUS  10/04/2009   Dr. Estill Bakes dilated CBD and main pancreatic duct.  No pancreatic  . FLEXIBLE SIGMOIDOSCOPY  1988   Dr. Laural Golden- suggested rohn's disease but the biopsies were not collaborative.  [name] FLEXIBLE SIGMOIDOSCOPY [name] 09/08/2016   Procedure: FLEXIBLE SIGMOIDOSCOPY;  Surgeon: Wonda Horner, MD;  Location: [facility] ENDOSCOPY;  Service: Gastroenterology;  Laterality: [name];  . HEMICOLOECTOMY W/ ANASTOMOSIS  1993   R- Dr.DeMason   . HERNIA REPAIR  1996   incarcerated periostial hernia with additional surgery in 1999  :  Current Outpatient Prescriptions:  .  Adalimumab (HUMIRA PEN) 40 MG/0.8ML PNKT, Inject 40 mg into the skin every 14 (fourteen) days., Disp: 2 each, Rfl: 6 .  calcium carbonate (OS-CAL) 600 MG TABS tablet, Take 1,200 mg by mouth daily with breakfast., Disp: , Rfl:  .  Ferrous Sulfate 28 MG TABS, Take 1 tablet by mouth daily., Disp: , Rfl:  .   HYDROcodone-acetaminophen (NORCO/VICODIN) 5-325 MG tablet, Take 1 tablet by mouth every 6 (six) hours as needed for moderate pain., Disp: 60 tablet, Rfl: 0 .  mirtazapine (REMERON) 15 MG tablet, Take 1 tablet (15 mg total) by mouth at bedtime., Disp: 90 tablet, Rfl: 1 .  Multiple Vitamins-Minerals (CENTRUM SILVER ADULT 50+) TABS, Take 1 tablet by mouth daily., Disp: , Rfl:  .  sodium chloride (OCEAN) 0.65 % SOLN nasal spray, Place 2 sprays into both nostrils 3 (three) times daily., Disp: 59 mL, Rfl: 1 .  lidocaine-prilocaine (EMLA) cream, Apply 1 application topically as needed. Apply to portacath prior to chemotherapy., Disp: 30 g, Rfl: 0 .  prochlorperazine (COMPAZINE) 10 MG tablet, Take 1 tablet (10 mg total) by mouth every 6 (six) hours as needed for nausea or vomiting., Disp: 30 tablet, Rfl: 0:  Allergies  Allergen Reactions  . Penicillins Hives  :  Family History  Problem Relation Age of Onset  . Cancer Father     prostate   . Prostate cancer Father   . Hypertension Sister   . Cancer Sister   . Depression Sister   . Breast cancer Sister   . COPD Sister   . Aneurysm Brother     deceased, brain aneurysm  :  Social History   Social History  . Marital status: Married    Spouse name: [name]  . Number of children: 56  . Years of education: [name]   Occupational History  . disabled Unemployed   Social History Main Topics  . Smoking status: Former Smoker    Packs/day: 0.50    Years: 2.00    Types: Cigarettes    Quit date: 12/21/2009  . Smokeless tobacco: Never Used     Comment: Quit abut 20 years  . Alcohol use No     Comment: Former drinker  . Drug use: No  . Sexual activity: Yes    Partners: Female   Other Topics Concern  . Not on file   Social History Narrative  . No narrative on file  :  Pertinent items are noted in HPI.  Exam: Blood pressure 108/70, pulse 74, temperature 97.8 F (36.6 C), temperature source Oral, resp. rate 18, height 5' 2"  (1.575 m), weight  151 lb 1.6 oz (68.5 kg), SpO2 100 %.  ECOG 1 General appearance: alert and cooperative appeared without distress. Head: Normocephalic, without obvious abnormality Throat: No oral thrush or ulcers. Neck: no adenopathy Back: negative Resp: clear to auscultation bilaterally Chest wall: no tenderness Cardio: regular rate and rhythm, S1, S2 normal, no murmur, click, rub or gallop GI: soft, non-tender; bowel sounds normal; no masses,  no organomegaly. Well-healed scars noted in his abdomen. Ileostomy bag was noted. Extremities: extremities normal, atraumatic, no cyanosis or edema Pulses: 2+ and symmetric Skin: Skin color, texture, turgor normal. No rashes or lesions  CBC    Component Value Date/Time   WBC 7.4 03/16/2017 0732   RBC 4.28 03/16/2017 0732   HGB 13.7 03/16/2017 0732   HCT 40.5 03/16/2017 0732   PLT 277 03/16/2017 0732   MCV 94.6 03/16/2017 0732  MCH 32.0 03/16/2017 0732   MCHC 33.8 03/16/2017 0732   RDW 12.5 03/16/2017 0732   LYMPHSABS 2.4 03/16/2017 0732   MONOABS 0.4 03/16/2017 0732   EOSABS 0.2 03/16/2017 0732   BASOSABS 0.0 03/16/2017 0732     Chemistry      Component Value Date/Time   NA 140 03/16/2017 0732   K 3.7 03/16/2017 0732   CL 106 03/16/2017 0732   CO2 27 03/16/2017 0732   BUN 18 03/16/2017 0732   CREATININE 1.36 (H) 03/16/2017 0732   CREATININE 1.46 (H) 02/11/2017 1640      Component Value Date/Time   CALCIUM 9.1 03/16/2017 0732   ALKPHOS 54 02/11/2017 1640   AST 19 02/11/2017 1640   ALT 11 02/11/2017 1640   BILITOT 0.6 02/11/2017 1640       Nm Bone Scan Whole Body  Result Date: 04/01/2017 CLINICAL DATA:  61 y/o M; history of prostate cancer. PSA 31.1 on 02/11/2017. Patient denies bone pain or trauma. EXAM: NUCLEAR MEDICINE WHOLE BODY BONE SCAN TECHNIQUE: Whole body anterior and posterior images were obtained approximately 3 hours after intravenous injection of radiopharmaceutical. RADIOPHARMACEUTICALS:  20.1 mCi Technetium-67mMDP IV  COMPARISON:  None. FINDINGS: Foci of radiotracer uptake are present within the right proximal femur near lesser trochanter, right posterior acetabulum, right ischium, lower sacrum, and right ilium near sacroiliac joint. Radiotracer excretion from kidneys noted. IMPRESSION: Multiple foci of radiotracer uptake within the pelvis and the right proximal femur probably representing osseous metastasis. Electronically Signed   By: LKristine GarbeM.D.   On: 04/01/2017 13:48   Ct Biopsy  Result Date: 03/16/2017 INDICATION: History of advanced Crohn's disease, no known primary, now with enlarging mass within the right hemipelvis. Please perform CT-guided biopsy for tissue diagnostic purposes. EXAM: CT-GUIDED BIOPSY OF INDETERMINATE ENLARGING MASS WITHIN THE RIGHT HEMIPELVIS. COMPARISON:  CT abdomen pelvis - 12/05/2017; 09/04/2016; abdominal MRI - 01/26/2017 MEDICATIONS: None. ANESTHESIA/SEDATION: Fentanyl 100 mcg IV; Versed 2 mg IV Sedation time: 15 minutes; The patient was continuously monitored during the procedure by the interventional radiology nurse under my direct supervision. CONTRAST:  None. COMPLICATIONS: None immediate. PROCEDURE: Informed consent was obtained from the patient following an explanation of the procedure, risks, benefits and alternatives. A time out was performed prior to the initiation of the procedure. The patient was positioned prone on the CT table and a limited CT was performed for procedural planning demonstrating grossly unchanged size and appearance of the known lobular appearing approximately 7.7 x 7.2 cm partially calcified mass within the right hemipelvis (image 19, series 2). The procedure was planned. The operative site was prepped and draped in the usual sterile fashion. Appropriate trajectory was confirmed with a 22 gauge spinal needle after the adjacent tissues were anesthetized with 1% Lidocaine with epinephrine. Under intermittent CT guidance, a 17 gauge coaxial needle  was advanced into the peripheral aspect of the mass. Appropriate positioning was confirmed and 6 core needle biopsy samples were obtained with an 18 gauge core needle biopsy device. The co-axial needle was removed following the administration of a Gel-Foam slurry and superficial hemostasis was achieved with manual compression. A limited postprocedural CT was negative for hemorrhage or additional complication. A dressing was placed. The patient tolerated the procedure well without immediate postprocedural complication. IMPRESSION: Technically successful CT guided core needle biopsy of indeterminate mass within the right hemipelvis. Electronically Signed   By: JSandi MariscalM.D.   On: 03/16/2017 12:03    Assessment and Plan:   61year old gentleman with  the following issues:  1. Prostate cancer diagnosed in March 2018. He presented with advanced disease including pelvic mass, diffuse adenopathy as well as diffuse bony disease. He underwent a biopsy of a 7.7 x 7.2 mass of the right hemipelvis which confirmed the presence of prostate cancer. His PSA is 31.3. He has already been started on androgen deprivation therapy.  The rationale for adding additional therapy as well as the natural course of advanced prostate cancer was discussed today. He understand he has an incurable malignancy that could be palliated for a period of time. Androgen deprivation only is the backbone of this treatment course. Additional therapy includes chemotherapy as well as Zytiga. Risks and benefits of these options were reviewed today and I feel systemic chemotherapy for limited. If time would be a better course of action. Complications associated with Taxotere chemotherapy for a total of 6 cycles administered every 3 weeks at a dose of 75 mg/m were reviewed. These complications include nausea, fatigue, myelosuppression, neutropenia, neutropenic sepsis and rarely severe illness, hospitalization or death. Exacerbation of his Crohn's  disease is possible as well.  After discussion today he is agreeable to proceed in the near future for a total of 6 cycles of Taxotere chemotherapy. The benefit of this chemotherapy would improve overall survival of at least 13 months.  2. IV access: Risks and benefits of Port-A-Cath insertion was discussed today and is agreeable to have that done in the near future.  3. Antiemetics: Prescription for Compazine was given to the patient.  4. Neutropenia prophylaxis: He will receive Neulasta after each injection.  5. Follow-up: Will be in the next few weeks to start chemotherapy.

## 2017-04-02 NOTE — Progress Notes (Signed)
START ON PATHWAY REGIMEN - Prostate   Docetaxel 75 mg/m2:   A cycle is every 21 days:     Docetaxel   **Always confirm [facility] + Bicalutamide:   A cycle is every 12 weeks:     Leuprolide acetate depot    Daily:     Bicalutamide   **Always confirm dose/schedule in your pharmacy ordering system**    Patient Characteristics: Adenocarcinoma, Metastatic, Hormone Naive, High Volume Disease* Current radiographic evidence of distant metastasis? Yes Histology: Adenocarcinoma AJCC T Category: cTX Gleason Primary: X AJCC N Category: NX Gleason Secondary: X AJCC M Category: M1c Gleason Score: X AJCC 8 Stage Grouping: IVB PSA Values (ng/mL): >= 20 Would you be surprised if this patient died  in the next year? I would be surprised if this patient died in the next year  Intent of Therapy: Non-Curative / Palliative Intent, Discussed with Patient

## 2017-04-03 ENCOUNTER — Ambulatory Visit (HOSPITAL_COMMUNITY)
Admission: RE | Admit: 2017-04-03 | Discharge: 2017-04-03 | Disposition: A | Discharge status: Home / Self Care | Payer: Medicare Other | Source: Ambulatory Visit | Attending: Oncology | Admitting: Oncology

## 2017-04-03 ENCOUNTER — Other Ambulatory Visit: Payer: Self-pay | Admitting: Oncology

## 2017-04-03 ENCOUNTER — Encounter (HOSPITAL_COMMUNITY): Payer: Self-pay

## 2017-04-03 DIAGNOSIS — Z87891 Personal history of nicotine dependence: Secondary | ICD-10-CM | POA: Insufficient documentation

## 2017-04-03 DIAGNOSIS — F419 Anxiety disorder, unspecified: Secondary | ICD-10-CM | POA: Insufficient documentation

## 2017-04-03 DIAGNOSIS — M545 Low back pain: Secondary | ICD-10-CM | POA: Insufficient documentation

## 2017-04-03 DIAGNOSIS — C7951 Secondary malignant neoplasm of bone: Secondary | ICD-10-CM | POA: Insufficient documentation

## 2017-04-03 DIAGNOSIS — Z933 Colostomy status: Secondary | ICD-10-CM | POA: Diagnosis not present

## 2017-04-03 DIAGNOSIS — C61 Malignant neoplasm of prostate: Secondary | ICD-10-CM

## 2017-04-03 DIAGNOSIS — K5090 Crohn's disease, unspecified, without complications: Secondary | ICD-10-CM | POA: Insufficient documentation

## 2017-04-03 DIAGNOSIS — Z880 Allergy status to penicillin: Secondary | ICD-10-CM | POA: Insufficient documentation

## 2017-04-03 DIAGNOSIS — Z452 Encounter for adjustment and management of vascular access device: Secondary | ICD-10-CM | POA: Diagnosis not present

## 2017-04-03 HISTORY — PX: IR FLUORO GUIDE PORT INSERTION RIGHT: IMG5741

## 2017-04-03 HISTORY — PX: IR US GUIDE VASC ACCESS RIGHT: IMG2390

## 2017-04-03 LAB — APTT: aPTT: 34 seconds (ref 24–36)

## 2017-04-03 LAB — CBC
HEMATOCRIT: 40 % (ref 39.0–52.0)
HEMOGLOBIN: 13.6 g/dL (ref 13.0–17.0)
MCH: 31.9 pg (ref 26.0–34.0)
MCHC: 34 g/dL (ref 30.0–36.0)
MCV: 93.7 fL (ref 78.0–100.0)
Platelets: 335 10*3/uL (ref 150–400)
RBC: 4.27 MIL/uL (ref 4.22–5.81)
RDW: 12.2 % (ref 11.5–15.5)
WBC: 7.4 10*3/uL (ref 4.0–10.5)

## 2017-04-03 LAB — PROTIME-INR
INR: 1.02
PROTHROMBIN TIME: 13.4 s (ref 11.4–15.2)

## 2017-04-03 MED ORDER — HEPARIN SOD (PORK) LOCK FLUSH 100 UNIT/ML IV SOLN
INTRAVENOUS | Status: AC | PRN
  Administered 2017-04-03: 500 [IU]

## 2017-04-03 MED ORDER — HEPARIN SOD (PORK) LOCK FLUSH 100 UNIT/ML IV SOLN
INTRAVENOUS | Status: AC
  Filled 2017-04-03: qty 5

## 2017-04-03 MED ORDER — FENTANYL CITRATE (PF) 100 MCG/2ML IJ SOLN
INTRAMUSCULAR | Status: AC
  Filled 2017-04-03: qty 2

## 2017-04-03 MED ORDER — LIDOCAINE HCL 1 % IJ SOLN
INTRAMUSCULAR | Status: AC
  Filled 2017-04-03: qty 20

## 2017-04-03 MED ORDER — VANCOMYCIN HCL IN DEXTROSE 1-5 GM/200ML-% IV SOLN
1000.0000 mg | Freq: Once | INTRAVENOUS | Status: AC
  Administered 2017-04-03: 1000 mg via INTRAVENOUS

## 2017-04-03 MED ORDER — FENTANYL CITRATE (PF) 100 MCG/2ML IJ SOLN
INTRAMUSCULAR | Status: AC | PRN
  Administered 2017-04-03 (×2): 50 ug via INTRAVENOUS

## 2017-04-03 MED ORDER — VANCOMYCIN HCL IN DEXTROSE 1-5 GM/200ML-% IV SOLN
INTRAVENOUS | Status: AC
  Filled 2017-04-03: qty 200

## 2017-04-03 MED ORDER — MIDAZOLAM HCL 2 MG/2ML IJ SOLN
INTRAMUSCULAR | Status: AC | PRN
  Administered 2017-04-03 (×4): 1 mg via INTRAVENOUS

## 2017-04-03 MED ORDER — LIDOCAINE-EPINEPHRINE (PF) 2 %-1:200000 IJ SOLN
INTRAMUSCULAR | Status: AC | PRN
  Administered 2017-04-03: 10 mL via INTRADERMAL

## 2017-04-03 MED ORDER — LIDOCAINE HCL 1 % IJ SOLN
INTRAMUSCULAR | Status: AC | PRN
  Administered 2017-04-03: 10 mL

## 2017-04-03 MED ORDER — LIDOCAINE-EPINEPHRINE (PF) 2 %-1:200000 IJ SOLN
INTRAMUSCULAR | Status: AC
  Filled 2017-04-03: qty 20

## 2017-04-03 MED ORDER — SODIUM CHLORIDE 0.9 % IV SOLN
INTRAVENOUS | Status: DC
  Administered 2017-04-03: 12:00:00 via INTRAVENOUS

## 2017-04-03 MED ORDER — MIDAZOLAM HCL 2 MG/2ML IJ SOLN
INTRAMUSCULAR | Status: AC
  Filled 2017-04-03: qty 6

## 2017-04-03 NOTE — H&P (Signed)
Chief Complaint: Patient was seen in consultation today for port placement at the request of Shadad,Firas N  Referring Physician(s): [facility] Physician: Markus Daft  Patient Status: [facility] - Out-pt  History of Present Illness: Daniel Howard is a 60 y.o. male with newly diagnosed metastatic prostate cancer. He is to start chemotherapy soon and is referred for port placement. PMHx, meds, labs, imaging, allergies reviewed. Has been NPO this am. Feels well, no recent fevers, chills, illness. Family came with him but is not presently in room.  Past Medical History:  Diagnosis Date  . Abnormal finding of biliary tract    MRCP shows pancreatic/biliary tract dilation. EUS 2010 confirmed dilation but no chronic pancreaitis or mass. Vascular ectasia crimpoing distal CBD.   [name] Anxiety   . 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 Duodenal ulcer 2010   nsaids  . Low back pain   . Peristomal hernia   . Small bowel obstruction   . Spigelian hernia    bilateral    Past Surgical History:  Procedure Laterality Date  . APPENDECTOMY  1992   at time of exp laparotomy at which time he was noted to have fistulizing Crohn's rather than UC  . COLONOSCOPY [name] 08/23/2014   OPF:[id number] proctoscopy with possible fistulous opening in thebase of rectal/anal stump.    . COLOSTOMY  1992   transverse loop colostomy secondary to a stricture  . ESOPHAGOGASTRODUODENOSCOPY  05/2009   SLF: multiple antral erosions, large ulcer at ansatomosis (postsurgical changes at duodenal bulb and second portion of duodenum) BX c/x NSAIDS.  . EUS  10/04/2009   Dr.  Estill Bakes dilated CBD and main pancreatic duct.  No pancreatic  . FLEXIBLE SIGMOIDOSCOPY  1988   Dr. Laural Golden- suggested rohn's disease but the biopsies were not collaborative.  [name] FLEXIBLE SIGMOIDOSCOPY [name] 09/08/2016   Procedure: FLEXIBLE SIGMOIDOSCOPY;  Surgeon: Wonda Horner, MD;  Location: [facility] ENDOSCOPY;  Service: Gastroenterology;  Laterality: [name];  . HEMICOLOECTOMY W/ ANASTOMOSIS  1993   R- Dr.DeMason   . [place]   incarcerated periostial hernia with additional surgery in 1999    Allergies: Penicillins  Medications: Prior to Admission medications   Medication Sig Start Date End Date Taking? Authorizing Provider  calcium carbonate (OS-CAL) 600 MG TABS tablet Take 1,200 mg by mouth daily with breakfast.   Yes Historical Provider, MD  Ferrous Sulfate 28 MG TABS Take 1 tablet by mouth daily.   Yes Historical Provider, MD  HYDROcodone-acetaminophen (NORCO/VICODIN) 5-325 MG tablet Take 1 tablet by mouth every 6 (six) hours as needed for moderate pain. 03/04/17  Yes Alycia Rossetti, MD  mirtazapine (REMERON) 15 MG tablet Take 1 tablet (15 mg total) by mouth at bedtime. 07/29/16  Yes Alycia Rossetti, MD  Multiple Vitamins-Minerals (CENTRUM SILVER ADULT 50+) TABS Take 1 tablet by mouth daily.   Yes Historical Provider, MD  sodium chloride (OCEAN) 0.65 % SOLN nasal spray Place 2 sprays into both nostrils 3 (three) times daily. 12/04/16  Yes Lorella Nimrod, MD  Adalimumab (HUMIRA PEN) 40 MG/0.8ML PNKT Inject 40 mg into the skin every 14 (fourteen) days. 12/19/16   Annitta Needs, NP  lidocaine-prilocaine (  EMLA) cream Apply 1 application topically as needed. Apply to portacath prior to chemotherapy. 04/02/17   [name], MD  prochlorperazine (COMPAZINE) 10 MG tablet Take 1 tablet (10 mg total) by mouth every 6 (six) hours as needed for nausea or vomiting. 04/02/17   [name], MD     Family History  Problem Relation Age of Onset  . Cancer Father     prostate   . Prostate  cancer Father   . Hypertension Sister   . Cancer Sister   . Depression Sister   . Breast cancer Sister   . COPD Sister   . Aneurysm Brother     deceased, brain aneurysm    Social History   Social History  . Marital status: Married    Spouse name: [name]  . Number of children: 61  . Years of education: [name]   Occupational History  . disabled Unemployed   Social History Main Topics  . Smoking status: Former Smoker    Packs/day: 0.50    Years: 2.00    Types: Cigarettes    Quit date: 12/21/2009  . Smokeless tobacco: Never Used     Comment: Quit abut 20 years  . Alcohol use No     Comment: Former drinker  . Drug use: No  . Sexual activity: Yes    Partners: Female   Other Topics Concern  . None   Social History Narrative  . None     Review of Systems: A 12 point ROS discussed and pertinent positives are indicated in the HPI above.  All other systems are negative.  Review of Systems  Vital Signs: BP 116/74   Pulse 73   Temp 98.1 F (36.7 C) (Oral)   Resp 16   Ht 5' 4.5" (1.638 m)   Wt 147 lb (66.7 kg)   SpO2 99%   BMI 24.84 kg/m   Physical Exam  Constitutional: He is oriented to person, place, and time. He appears well-developed and well-nourished. No distress.  HENT:  Head: Normocephalic.  Mouth/Throat: Oropharynx is clear and moist.  Neck: Normal range of motion. No JVD present. No tracheal deviation present.  Cardiovascular: Normal rate, regular rhythm and normal heart sounds.   Pulmonary/Chest: Effort normal and breath sounds normal. No respiratory distress.  Neurological: He is alert and oriented to person, place, and time.  Skin: Skin is warm and dry.  Psychiatric: He has a normal mood and affect. Judgment normal.    Mallampati Score:  MD Evaluation Airway: WNL Heart: WNL Abdomen: WNL Chest/ Lungs: WNL ASA  Classification: 3 Mallampati/Airway Score: Two  Imaging: Nm Bone Scan Whole Body  Result Date: 04/01/2017 CLINICAL DATA:  61 y/o M;  history of prostate cancer. PSA 31.1 on 02/11/2017. Patient denies bone pain or trauma. EXAM: NUCLEAR MEDICINE WHOLE BODY BONE SCAN TECHNIQUE: Whole body anterior and posterior images were obtained approximately 3 hours after intravenous injection of radiopharmaceutical. RADIOPHARMACEUTICALS:  20.1 mCi Technetium-85mMDP IV COMPARISON:  None. FINDINGS: Foci of radiotracer uptake are present within the right proximal femur near lesser trochanter, right posterior acetabulum, right ischium, lower sacrum, and right ilium near sacroiliac joint. Radiotracer excretion from kidneys noted. IMPRESSION: Multiple foci of radiotracer uptake within the pelvis and the right proximal femur probably representing osseous metastasis. Electronically Signed   By: LKristine GarbeM.D.   On: 04/01/2017 13:48   Ct Biopsy  Result Date: 03/16/2017 INDICATION: History of advanced Crohn's disease, no known primary, now with enlarging mass within the  right hemipelvis. Please perform CT-guided biopsy for tissue diagnostic purposes. EXAM: CT-GUIDED BIOPSY OF INDETERMINATE ENLARGING MASS WITHIN THE RIGHT HEMIPELVIS. COMPARISON:  CT abdomen pelvis - 12/05/2017; 09/04/2016; abdominal MRI - 01/26/2017 MEDICATIONS: None. ANESTHESIA/SEDATION: Fentanyl 100 mcg IV; Versed 2 mg IV Sedation time: 15 minutes; The patient was continuously monitored during the procedure by the interventional radiology nurse under my direct supervision. CONTRAST:  None. COMPLICATIONS: None immediate. PROCEDURE: Informed consent was obtained from the patient following an explanation of the procedure, risks, benefits and alternatives. A time out was performed prior to the initiation of the procedure. The patient was positioned prone on the CT table and a limited CT was performed for procedural planning demonstrating grossly unchanged size and appearance of the known lobular appearing approximately 7.7 x 7.2 cm partially calcified mass within the right hemipelvis  (image 19, series 2). The procedure was planned. The operative site was prepped and draped in the usual sterile fashion. Appropriate trajectory was confirmed with a 22 gauge spinal needle after the adjacent tissues were anesthetized with 1% Lidocaine with epinephrine. Under intermittent CT guidance, a 17 gauge coaxial needle was advanced into the peripheral aspect of the mass. Appropriate positioning was confirmed and 6 core needle biopsy samples were obtained with an 18 gauge core needle biopsy device. The co-axial needle was removed following the administration of a Gel-Foam slurry and superficial hemostasis was achieved with manual compression. A limited postprocedural CT was negative for hemorrhage or additional complication. A dressing was placed. The patient tolerated the procedure well without immediate postprocedural complication. IMPRESSION: Technically successful CT guided core needle biopsy of indeterminate mass within the right hemipelvis. Electronically Signed   By: Sandi Mariscal M.D.   On: 03/16/2017 12:03    Labs:  CBC:  Recent Labs  12/04/16 1029 02/11/17 1640 03/16/17 0732 04/03/17 1154  WBC 9.5 7.3 7.4 7.4  HGB 13.5 14.0 13.7 13.6  HCT 39.9 42.8 40.5 40.0  PLT 256 332 277 335    COAGS:  Recent Labs  03/16/17 0732  INR 1.02    BMP:  Recent Labs  09/07/16 0558 09/08/16 1006 11/03/16 1047 01/26/17 1036 02/11/17 1640 03/16/17 0732  NA 138 138 140  --  139 140  K 4.7 4.2 4.9  --  4.0 3.7  CL 105 104 104  --  103 106  CO2 26 29 29   --  23 27  GLUCOSE 96 91 92  --  180* 90  BUN 9 7 17   --  15 18  CALCIUM 8.9 8.8* 9.5  --  9.1 9.1  CREATININE 1.14 1.14 1.23 1.40* 1.46* 1.36*  GFRNONAA >60 >60  --   --  52* 55*  GFRAA >60 >60  --   --  60 >60    LIVER FUNCTION TESTS:  Recent Labs  09/04/16 0953 09/05/16 0748 11/03/16 1047 02/11/17 1640  BILITOT 1.3* 0.7 0.6 0.6  AST 19 16 20 19   ALT 12* 10* 13 11  ALKPHOS 47 48 44 54  PROT 7.0 7.3 7.1 7.4  ALBUMIN  3.0* 2.9* 3.8 3.8    TUMOR MARKERS:  Recent Labs  09/05/16 0748  CEA 2.2    Assessment and Plan: Prostate cancer For port placement Labs pending Risks and Benefits discussed with the patient including, but not limited to bleeding, infection, pneumothorax, or fibrin sheath development and need for additional procedures. All of the patient's questions were answered, patient is agreeable to proceed. Consent signed and in chart.    Thank  you for this interesting consult.  I greatly enjoyed meeting Daniel Howard and look forward to participating in their care.  A copy of this report was sent to the requesting provider on this date.  Electronically Signed: Ascencion Dike 04/03/2017, 12:25 PM   I spent a total of 25 minutes in face to face in clinical consultation, greater than 50% of which was counseling/coordinating care for port placement

## 2017-04-03 NOTE — Discharge Instructions (Signed)
Implanted [facility] Guide An implanted port is a type of central line that is placed under the skin. Central lines are used to provide IV access when treatment or nutrition needs to be given through a persons veins. Implanted ports are used for long-term IV access. An implanted port may be placed because:  You need IV medicine that would be irritating to the small veins in your hands or arms.  You need long-term IV medicines, such as antibiotics.  You need IV nutrition for a long period.  You need frequent blood draws for lab tests.  You need dialysis. Implanted ports are usually placed in the chest area, but they can also be placed in the upper arm, the abdomen, or the leg. An implanted port has two main parts:  Reservoir. The reservoir is round and will appear as a small, raised area under your skin. The reservoir is the part where a needle is inserted to give medicines or draw blood.  Catheter. The catheter is a thin, flexible tube that extends from the reservoir. The catheter is placed into a large vein. Medicine that is inserted into the reservoir goes into the catheter and then into the vein. How will I care for my incision site? Do not get the incision site wet. Bathe or shower as directed by your health care provider. How is my port accessed? Special steps must be taken to access the port:  Before the port is accessed, a numbing cream can be placed on the skin. This helps numb the skin over the port site.  Your health care provider uses a sterile technique to access the port.  Your health care provider must put on a mask and sterile gloves.  The skin over your port is cleaned carefully with an antiseptic and allowed to dry.  The port is gently pinched between sterile gloves, and a needle is inserted into the port.  Only "non-coring" port needles should be used to access the port. Once the port is accessed, a blood return should be checked. This helps ensure that the port is  in the vein and is not clogged.  If your port needs to remain accessed for a constant infusion, a clear (transparent) bandage will be placed over the needle site. The bandage and needle will need to be changed every week, or as directed by your health care provider.  Keep the bandage covering the needle clean and dry. Do not get it wet. Follow your health care providers instructions on how to take a shower or bath while the port is accessed.  If your port does not need to stay accessed, no bandage is needed over the port. What is flushing? Flushing helps keep the port from getting clogged. Follow your health care providers instructions on how and when to flush the port. Ports are usually flushed with saline solution or a medicine called heparin. The need for flushing will depend on how the port is used.  If the port is used for intermittent medicines or blood draws, the port will need to be flushed:  After medicines have been given.  After blood has been drawn.  As part of routine maintenance.  If a constant infusion is running, the port may not need to be flushed. How long will my port stay implanted? The port can stay in for as long as your health care provider thinks it is needed. When it is time for the port to come out, surgery will be done to remove it.  The procedure is similar to the one performed when the port was put in. When should I seek immediate medical care? When you have an implanted port, you should seek immediate medical care if:  You notice a bad smell coming from the incision site.  You have swelling, redness, or drainage at the incision site.  You have more swelling or pain at the port site or the surrounding area.  You have a fever that is not controlled with medicine. This information is not intended to replace advice given to you by your health care provider. Make sure you discuss any questions you have with your health care provider. Document Released:  12/08/2005 Document Revised: 05/15/2016 Document Reviewed: 08/15/2013 Elsevier Interactive Patient Education  2017 [place] Insertion, Care After This sheet gives you information about how to care for yourself after your procedure. Your health care provider may also give you more specific instructions. If you have problems or questions, contact your health care provider. What can I expect after the procedure? After your procedure, it is common to have:  Discomfort at the port insertion site.  Bruising on the skin over the port. This should improve over 3-4 days. Follow these instructions at home: [facility] care   After your port is placed, you will get a manufacturer's information card. The card has information about your port. Keep this card with you at all times.  Take care of the port as told by your health care provider. Ask your health care provider if you or a family member can get training for taking care of the port at home. A home health care nurse may also take care of the port.  Make sure to remember what type of port you have. Incision care   Follow instructions from your health care provider about how to take care of your port insertion site. Make sure you:  Wash your hands with soap and water before you change your bandage (dressing). If soap and water are not available, use hand sanitizer.  Change your dressing as told by your health care provider.  Leave stitches (sutures), skin glue, or adhesive strips in place. These skin closures may need to stay in place for 2 weeks or longer. If adhesive strip edges start to loosen and curl up, you may trim the loose edges. Do not remove adhesive strips completely unless your health care provider tells you to do that.  Check your port insertion site every day for signs of infection. Check for:  More redness, swelling, or pain.  More fluid or blood.  Warmth.  Pus or a bad smell. General instructions   Do  not take baths, swim, or use a hot tub until your health care provider approves.  Do not lift anything that is heavier than 10 lb (4.5 kg) for a week, or as told by your health care provider.  Ask your health care provider when it is okay to:  Return to work or school.  Resume usual physical activities or sports.  Do not drive for 24 hours if you were given a medicine to help you relax (sedative).  Take over-the-counter and prescription medicines only as told by your health care provider.  Wear a medical alert bracelet in case of an emergency. This will tell any health care providers that you have a port.  Keep all follow-up visits as told by your health care provider. This is important. Contact a health care provider if:  You cannot flush your  port with saline as directed, or you cannot draw blood from the port.  You have a fever or chills.  You have more redness, swelling, or pain around your port insertion site.  You have more fluid or blood coming from your port insertion site.  Your port insertion site feels warm to the touch.  You have pus or a bad smell coming from the port insertion site. Get help right away if:  You have chest pain or shortness of breath.  You have bleeding from your port that you cannot control. Summary  Take care of the port as told by your health care provider.  Change your dressing as told by your health care provider.  Keep all follow-up visits as told by your health care provider. This information is not intended to replace advice given to you by your health care provider. Make sure you discuss any questions you have with your health care provider. Document Released: 09/28/2013 Document Revised: 10/29/2016 Document Reviewed: 10/29/2016 Elsevier Interactive Patient Education  2017 [place].   Moderate Conscious Sedation, Adult, Care After These instructions provide you with information about caring for yourself after your procedure.  Your health care provider may also give you more specific instructions. Your treatment has been planned according to current medical practices, but problems sometimes occur. Call your health care provider if you have any problems or questions after your procedure. What can I expect after the procedure? After your procedure, it is common:  To feel sleepy for several hours.  To feel clumsy and have poor balance for several hours.  To have poor judgment for several hours.  To vomit if you eat too soon. Follow these instructions at home: For at least 24 hours after the procedure:    Do not:  Participate in activities where you could fall or become injured.  Drive.  Use heavy machinery.  Drink alcohol.  Take sleeping pills or medicines that cause drowsiness.  Make important decisions or sign legal documents.  Take care of children on your own.  Rest. Eating and drinking   Follow the diet recommended by your health care provider.  If you vomit:  Drink water, juice, or soup when you can drink without vomiting.  Make sure you have little or no nausea before eating solid foods. General instructions   Have a responsible adult stay with you until you are awake and alert.  Take over-the-counter and prescription medicines only as told by your health care provider.  If you smoke, do not smoke without supervision.  Keep all follow-up visits as told by your health care provider. This is important. Contact a health care provider if:  You keep feeling nauseous or you keep vomiting.  You feel light-headed.  You develop a rash.  You have a fever. Get help right away if:  You have trouble breathing. This information is not intended to replace advice given to you by your health care provider. Make sure you discuss any questions you have with your health care provider. Document Released: 09/28/2013 Document Revised: 05/12/2016 Document Reviewed: 03/29/2016 Elsevier  Interactive Patient Education  2017 [employer].

## 2017-04-03 NOTE — Procedures (Signed)
Placement of right jugular port.  Tip at SVC/RA junction.  Minimal blood loss.  No immediate complication.

## 2017-04-06 ENCOUNTER — Other Ambulatory Visit: Payer: Medicare Other

## 2017-04-22 ENCOUNTER — Other Ambulatory Visit: Payer: Self-pay | Admitting: Family Medicine

## 2017-04-24 ENCOUNTER — Ambulatory Visit (HOSPITAL_BASED_OUTPATIENT_CLINIC_OR_DEPARTMENT_OTHER): Payer: Medicare Other

## 2017-04-24 ENCOUNTER — Other Ambulatory Visit (HOSPITAL_BASED_OUTPATIENT_CLINIC_OR_DEPARTMENT_OTHER): Payer: Medicare Other

## 2017-04-24 ENCOUNTER — Encounter: Payer: Self-pay | Admitting: Oncology

## 2017-04-24 ENCOUNTER — Other Ambulatory Visit: Payer: Self-pay | Admitting: *Deleted

## 2017-04-24 ENCOUNTER — Ambulatory Visit: Payer: Medicare Other

## 2017-04-24 VITALS — BP 109/62 | HR 66 | Temp 98.5°F | Resp 16

## 2017-04-24 DIAGNOSIS — C61 Malignant neoplasm of prostate: Secondary | ICD-10-CM

## 2017-04-24 DIAGNOSIS — Z5111 Encounter for antineoplastic chemotherapy: Secondary | ICD-10-CM

## 2017-04-24 LAB — CBC WITH DIFFERENTIAL/PLATELET
BASO%: 1.1 % (ref 0.0–2.0)
BASOS ABS: 0.1 10*3/uL (ref 0.0–0.1)
EOS%: 2.9 % (ref 0.0–7.0)
Eosinophils Absolute: 0.3 10*3/uL (ref 0.0–0.5)
HCT: 37.4 % — ABNORMAL LOW (ref 38.4–49.9)
HGB: 12.5 g/dL — ABNORMAL LOW (ref 13.0–17.1)
LYMPH#: 2.6 10*3/uL (ref 0.9–3.3)
LYMPH%: 29.8 % (ref 14.0–49.0)
MCH: 31.9 pg (ref 27.2–33.4)
MCHC: 33.5 g/dL (ref 32.0–36.0)
MCV: 95 fL (ref 79.3–98.0)
MONO#: 0.6 10*3/uL (ref 0.1–0.9)
MONO%: 6.8 % (ref 0.0–14.0)
NEUT#: 5.1 10*3/uL (ref 1.5–6.5)
NEUT%: 59.4 % (ref 39.0–75.0)
Platelets: 280 10*3/uL (ref 140–400)
RBC: 3.93 10*6/uL — AB (ref 4.20–5.82)
RDW: 12.8 % (ref 11.0–14.6)
WBC: 8.6 10*3/uL (ref 4.0–10.3)

## 2017-04-24 LAB — COMPREHENSIVE METABOLIC PANEL
ALT: 12 U/L (ref 0–55)
AST: 21 U/L (ref 5–34)
Albumin: 3.4 g/dL — ABNORMAL LOW (ref 3.5–5.0)
Alkaline Phosphatase: 134 U/L (ref 40–150)
Anion Gap: 10 mEq/L (ref 3–11)
BUN: 18.1 mg/dL (ref 7.0–26.0)
CALCIUM: 9.5 mg/dL (ref 8.4–10.4)
CHLORIDE: 107 meq/L (ref 98–109)
CO2: 24 meq/L (ref 22–29)
Creatinine: 1.2 mg/dL (ref 0.7–1.3)
EGFR: 80 mL/min/{1.73_m2} — ABNORMAL LOW (ref >90)
Glucose: 95 mg/dl (ref 70–140)
POTASSIUM: 4.1 meq/L (ref 3.5–5.1)
SODIUM: 141 meq/L (ref 136–145)
Total Bilirubin: 0.84 mg/dL (ref 0.20–1.20)
Total Protein: 7.8 g/dL (ref 6.4–8.3)

## 2017-04-24 MED ORDER — SODIUM CHLORIDE 0.9 % IJ SOLN
10.0000 mL | Freq: Once | INTRAMUSCULAR | Status: AC
  Administered 2017-04-24: 10 mL
  Filled 2017-04-24: qty 10

## 2017-04-24 MED ORDER — HEPARIN SOD (PORK) LOCK FLUSH 100 UNIT/ML IV SOLN
500.0000 [IU] | Freq: Once | INTRAVENOUS | Status: AC | PRN
  Administered 2017-04-24: 500 [IU]
  Filled 2017-04-24: qty 5

## 2017-04-24 MED ORDER — DOCETAXEL CHEMO INJECTION 160 MG/16ML
75.0000 mg/m2 | Freq: Once | INTRAVENOUS | Status: AC
  Administered 2017-04-24: 130 mg via INTRAVENOUS
  Filled 2017-04-24: qty 13

## 2017-04-24 MED ORDER — SODIUM CHLORIDE 0.9% FLUSH
10.0000 mL | INTRAVENOUS | Status: DC | PRN
  Administered 2017-04-24: 10 mL
  Filled 2017-04-24: qty 10

## 2017-04-24 MED ORDER — DEXAMETHASONE SODIUM PHOSPHATE 10 MG/ML IJ SOLN
INTRAMUSCULAR | Status: AC
  Filled 2017-04-24: qty 1

## 2017-04-24 MED ORDER — DEXAMETHASONE SODIUM PHOSPHATE 10 MG/ML IJ SOLN
10.0000 mg | Freq: Once | INTRAMUSCULAR | Status: AC
  Administered 2017-04-24: 10 mg via INTRAVENOUS

## 2017-04-24 MED ORDER — SODIUM CHLORIDE 0.9 % IV SOLN
Freq: Once | INTRAVENOUS | Status: AC
  Administered 2017-04-24: 11:00:00 via INTRAVENOUS

## 2017-04-24 NOTE — Progress Notes (Signed)
Met w/ pt to introduce myself as his Arboriculturist and to discuss the Prostate and [employer].  Pt has 2 insurances so copay assistance is not needed.  I went over the grants and gave him an expense sheet.  Pt would like to apply so he will provide his proof of income on 04/27/17.  He has my card for any questions or concerns he may have in the future.

## 2017-04-24 NOTE — Patient Instructions (Signed)
[facility] Discharge Instructions for Patients Receiving Chemotherapy  Today you received the following chemotherapy agents:  Taxotere.  To help prevent nausea and vomiting after your treatment, we encourage you to take your nausea medication as directed.   If you develop nausea and vomiting that is not controlled by your nausea medication, call the clinic.   BELOW ARE SYMPTOMS THAT SHOULD BE REPORTED IMMEDIATELY:  *FEVER GREATER THAN 100.5 F  *CHILLS WITH OR WITHOUT FEVER  NAUSEA AND VOMITING THAT IS NOT CONTROLLED WITH YOUR NAUSEA MEDICATION  *UNUSUAL SHORTNESS OF BREATH  *UNUSUAL BRUISING OR BLEEDING  TENDERNESS IN MOUTH AND THROAT WITH OR WITHOUT PRESENCE OF ULCERS  *URINARY PROBLEMS  *BOWEL PROBLEMS  UNUSUAL RASH Items with * indicate a potential emergency and should be followed up as soon as possible.  Feel free to call the clinic you have any questions or concerns. The clinic phone number is [phone].  Please show the [facility] at check-in to the Emergency Department and triage nurse.  Docetaxel injection What is this medicine? DOCETAXEL (doe se TAX el) is a chemotherapy drug. It targets fast dividing cells, like cancer cells, and causes these cells to die. This medicine is used to treat many types of cancers like breast cancer, certain stomach cancers, head and neck cancer, lung cancer, and prostate cancer. This medicine may be used for other purposes; ask your health care provider or pharmacist if you have questions. COMMON BRAND NAME(S): Docefrez, Taxotere What should I tell my health care provider before I take this medicine? They need to know if you have any of these conditions: -infection (especially a virus infection such as chickenpox, cold sores, or herpes) -liver disease -low blood counts, like low white cell, platelet, or red cell counts -an unusual or allergic reaction to docetaxel, polysorbate 80, other chemotherapy agents,  other medicines, foods, dyes, or preservatives -pregnant or trying to get pregnant -breast-feeding How should I use this medicine? This drug is given as an infusion into a vein. It is administered in a hospital or clinic by a specially trained health care professional. Talk to your pediatrician regarding the use of this medicine in children. Special care may be needed. Overdosage: If you think you have taken too much of this medicine contact a poison control center or emergency room at once. NOTE: This medicine is only for you. Do not share this medicine with others. What if I miss a dose? It is important not to miss your dose. Call your doctor or health care professional if you are unable to keep an appointment. What may interact with this medicine? -cyclosporine -erythromycin -ketoconazole -medicines to increase blood counts like filgrastim, pegfilgrastim, sargramostim -vaccines Talk to your doctor or health care professional before taking any of these medicines: -acetaminophen -aspirin -ibuprofen -ketoprofen -naproxen This list may not describe all possible interactions. Give your health care provider a list of all the medicines, herbs, non-prescription drugs, or dietary supplements you use. Also tell them if you smoke, drink alcohol, or use illegal drugs. Some items may interact with your medicine. What should I watch for while using this medicine? Your condition will be monitored carefully while you are receiving this medicine. You will need important blood work done while you are taking this medicine. This drug may make you feel generally unwell. This is not uncommon, as chemotherapy can affect healthy cells as well as cancer cells. Report any side effects. Continue your course of treatment even though you feel ill unless  your doctor tells you to stop. In some cases, you may be given additional medicines to help with side effects. Follow all directions for their use. Call your doctor  or health care professional for advice if you get a fever, chills or sore throat, or other symptoms of a cold or flu. Do not treat yourself. This drug decreases your body's ability to fight infections. Try to avoid being around people who are sick. This medicine may increase your risk to bruise or bleed. Call your doctor or health care professional if you notice any unusual bleeding. This medicine may contain alcohol in the product. You may get drowsy or dizzy. Do not drive, use machinery, or do anything that needs mental alertness until you know how this medicine affects you. Do not stand or sit up quickly, especially if you are an older patient. This reduces the risk of dizzy or fainting spells. Avoid alcoholic drinks. Do not become pregnant while taking this medicine. Women should inform their doctor if they wish to become pregnant or think they might be pregnant. There is a potential for serious side effects to an unborn child. Talk to your health care professional or pharmacist for more information. Do not breast-feed an infant while taking this medicine. What side effects may I notice from receiving this medicine? Side effects that you should report to your doctor or health care professional as soon as possible: -allergic reactions like skin rash, itching or hives, swelling of the face, lips, or tongue -low blood counts - This drug may decrease the number of white blood cells, red blood cells and platelets. You may be at increased risk for infections and bleeding. -signs of infection - fever or chills, cough, sore throat, pain or difficulty passing urine -signs of decreased platelets or bleeding - bruising, pinpoint red spots on the skin, black, tarry stools, nosebleeds -signs of decreased red blood cells - unusually weak or tired, fainting spells, lightheadedness -breathing problems -fast or irregular heartbeat -low blood pressure -mouth sores -nausea and vomiting -pain, swelling, redness or  irritation at the injection site -pain, tingling, numbness in the hands or feet -swelling of the ankle, feet, hands -weight gain Side effects that usually do not require medical attention (report to your doctor or health care professional if they continue or are bothersome): -bone pain -complete hair loss including hair on your head, underarms, pubic hair, eyebrows, and eyelashes -diarrhea -excessive tearing -changes in the color of fingernails -loosening of the fingernails -nausea -muscle pain -red flush to skin -sweating -weak or tired This list may not describe all possible side effects. Call your doctor for medical advice about side effects. You may report side effects to FDA at 1-800-FDA-1088. Where should I keep my medicine? This drug is given in a hospital or clinic and will not be stored at home. NOTE: This sheet is a summary. It may not cover all possible information. If you have questions about this medicine, talk to your doctor, pharmacist, or health care provider.  2018 Elsevier/Gold Standard (2016-01-10 12:32:56)

## 2017-04-27 ENCOUNTER — Ambulatory Visit (HOSPITAL_BASED_OUTPATIENT_CLINIC_OR_DEPARTMENT_OTHER): Payer: Medicare Other

## 2017-04-27 ENCOUNTER — Telehealth: Payer: Self-pay

## 2017-04-27 VITALS — BP 105/63 | HR 97 | Temp 98.9°F | Resp 18

## 2017-04-27 DIAGNOSIS — C61 Malignant neoplasm of prostate: Secondary | ICD-10-CM | POA: Diagnosis not present

## 2017-04-27 DIAGNOSIS — Z5111 Encounter for antineoplastic chemotherapy: Secondary | ICD-10-CM | POA: Diagnosis present

## 2017-04-27 LAB — PSA: PROSTATE SPECIFIC AG, SERUM: 1.8 ng/mL (ref 0.0–4.0)

## 2017-04-27 MED ORDER — PEGFILGRASTIM INJECTION 6 MG/0.6ML ~~LOC~~
6.0000 mg | PREFILLED_SYRINGE | Freq: Once | SUBCUTANEOUS | Status: AC
  Administered 2017-04-27: 6 mg via SUBCUTANEOUS
  Filled 2017-04-27: qty 0.6

## 2017-04-27 NOTE — Telephone Encounter (Signed)
First chemo follow-up call.  No complaints of N/V. Vital signs stable.  Patient eating and drinking well.  Patient told to call office if he has any complaints.  Patient received Neulasta injection today.

## 2017-04-27 NOTE — Patient Instructions (Signed)
Pegfilgrastim injection What is this medicine? PEGFILGRASTIM (PEG fil gra stim) is a long-acting granulocyte colony-stimulating factor that stimulates the growth of neutrophils, a type of white blood cell important in the body's fight against infection. It is used to reduce the incidence of fever and infection in patients with certain types of cancer who are receiving chemotherapy that affects the bone marrow, and to increase survival after being exposed to high doses of radiation. This medicine may be used for other purposes; ask your health care provider or pharmacist if you have questions. COMMON BRAND NAME(S): Neulasta What should I tell my health care provider before I take this medicine? They need to know if you have any of these conditions: -kidney disease -latex allergy -ongoing radiation therapy -sickle cell disease -skin reactions to acrylic adhesives (On-Body Injector only) -an unusual or allergic reaction to pegfilgrastim, filgrastim, other medicines, foods, dyes, or preservatives -pregnant or trying to get pregnant -breast-feeding How should I use this medicine? This medicine is for injection under the skin. If you get this medicine at home, you will be taught how to prepare and give the pre-filled syringe or how to use the On-body Injector. Refer to the patient Instructions for Use for detailed instructions. Use exactly as directed. Tell your healthcare provider immediately if you suspect that the On-body Injector may not have performed as intended or if you suspect the use of the On-body Injector resulted in a missed or partial dose. It is important that you put your used needles and syringes in a special sharps container. Do not put them in a trash can. If you do not have a sharps container, call your pharmacist or healthcare provider to get one. Talk to your pediatrician regarding the use of this medicine in children. While this drug may be prescribed for selected conditions,  precautions do apply. Overdosage: If you think you have taken too much of this medicine contact a poison control center or emergency room at once. NOTE: This medicine is only for you. Do not share this medicine with others. What if I miss a dose? It is important not to miss your dose. Call your doctor or health care professional if you miss your dose. If you miss a dose due to an On-body Injector failure or leakage, a new dose should be administered as soon as possible using a single prefilled syringe for manual use. What may interact with this medicine? Interactions have not been studied. Give your health care provider a list of all the medicines, herbs, non-prescription drugs, or dietary supplements you use. Also tell them if you smoke, drink alcohol, or use illegal drugs. Some items may interact with your medicine. This list may not describe all possible interactions. Give your health care provider a list of all the medicines, herbs, non-prescription drugs, or dietary supplements you use. Also tell them if you smoke, drink alcohol, or use illegal drugs. Some items may interact with your medicine. What should I watch for while using this medicine? You may need blood work done while you are taking this medicine. If you are going to need a MRI, CT scan, or other procedure, tell your doctor that you are using this medicine (On-Body Injector only). What side effects may I notice from receiving this medicine? Side effects that you should report to your doctor or health care professional as soon as possible: -allergic reactions like skin rash, itching or hives, swelling of the face, lips, or tongue -dizziness -fever -pain, redness, or irritation at site  where injected -pinpoint red spots on the skin -red or dark-brown urine -shortness of breath or breathing problems -stomach or side pain, or pain at the shoulder -swelling -tiredness -trouble passing urine or change in the amount of urine Side  effects that usually do not require medical attention (report to your doctor or health care professional if they continue or are bothersome): -bone pain -muscle pain This list may not describe all possible side effects. Call your doctor for medical advice about side effects. You may report side effects to FDA at 1-800-FDA-1088. Where should I keep my medicine? Keep out of the reach of children. Store pre-filled syringes in a refrigerator between 2 and 8 degrees C (36 and 46 degrees F). Do not freeze. Keep in carton to protect from light. Throw away this medicine if it is left out of the refrigerator for more than 48 hours. Throw away any unused medicine after the expiration date. NOTE: This sheet is a summary. It may not cover all possible information. If you have questions about this medicine, talk to your doctor, pharmacist, or health care provider.  2018 Elsevier/Gold Standard (2016-12-04 12:58:03)

## 2017-04-29 DIAGNOSIS — C61 Malignant neoplasm of prostate: Secondary | ICD-10-CM | POA: Diagnosis not present

## 2017-04-30 ENCOUNTER — Encounter: Payer: Self-pay | Admitting: *Deleted

## 2017-05-15 ENCOUNTER — Encounter: Payer: Self-pay | Admitting: Oncology

## 2017-05-15 ENCOUNTER — Ambulatory Visit (HOSPITAL_BASED_OUTPATIENT_CLINIC_OR_DEPARTMENT_OTHER): Payer: Medicare Other | Admitting: Oncology

## 2017-05-15 ENCOUNTER — Other Ambulatory Visit (HOSPITAL_BASED_OUTPATIENT_CLINIC_OR_DEPARTMENT_OTHER): Payer: Medicare Other

## 2017-05-15 ENCOUNTER — Telehealth: Payer: Self-pay | Admitting: Oncology

## 2017-05-15 ENCOUNTER — Ambulatory Visit (HOSPITAL_BASED_OUTPATIENT_CLINIC_OR_DEPARTMENT_OTHER): Payer: Medicare Other

## 2017-05-15 ENCOUNTER — Ambulatory Visit: Payer: Medicare Other

## 2017-05-15 VITALS — BP 101/58 | HR 99 | Temp 98.7°F | Resp 18 | Ht 64.5 in | Wt 143.5 lb

## 2017-05-15 DIAGNOSIS — C7951 Secondary malignant neoplasm of bone: Secondary | ICD-10-CM

## 2017-05-15 DIAGNOSIS — C61 Malignant neoplasm of prostate: Secondary | ICD-10-CM

## 2017-05-15 DIAGNOSIS — E291 Testicular hypofunction: Secondary | ICD-10-CM

## 2017-05-15 DIAGNOSIS — Z5111 Encounter for antineoplastic chemotherapy: Secondary | ICD-10-CM | POA: Diagnosis not present

## 2017-05-15 DIAGNOSIS — Z95828 Presence of other vascular implants and grafts: Secondary | ICD-10-CM

## 2017-05-15 LAB — CBC WITH DIFFERENTIAL/PLATELET
BASO%: 0.7 % (ref 0.0–2.0)
BASOS ABS: 0.1 10*3/uL (ref 0.0–0.1)
EOS%: 0.9 % (ref 0.0–7.0)
Eosinophils Absolute: 0.1 10*3/uL (ref 0.0–0.5)
HEMATOCRIT: 29.5 % — AB (ref 38.4–49.9)
HGB: 9.9 g/dL — ABNORMAL LOW (ref 13.0–17.1)
LYMPH#: 2.9 10*3/uL (ref 0.9–3.3)
LYMPH%: 19.9 % (ref 14.0–49.0)
MCH: 31.9 pg (ref 27.2–33.4)
MCHC: 33.6 g/dL (ref 32.0–36.0)
MCV: 94.9 fL (ref 79.3–98.0)
MONO#: 1 10*3/uL — ABNORMAL HIGH (ref 0.1–0.9)
MONO%: 6.9 % (ref 0.0–14.0)
NEUT#: 10.5 10*3/uL — ABNORMAL HIGH (ref 1.5–6.5)
NEUT%: 71.6 % (ref 39.0–75.0)
Platelets: 422 10*3/uL — ABNORMAL HIGH (ref 140–400)
RBC: 3.1 10*6/uL — AB (ref 4.20–5.82)
RDW: 13.8 % (ref 11.0–14.6)
WBC: 14.6 10*3/uL — ABNORMAL HIGH (ref 4.0–10.3)

## 2017-05-15 LAB — COMPREHENSIVE METABOLIC PANEL
ALT: 24 U/L (ref 0–55)
ANION GAP: 11 meq/L (ref 3–11)
AST: 28 U/L (ref 5–34)
Albumin: 3.2 g/dL — ABNORMAL LOW (ref 3.5–5.0)
Alkaline Phosphatase: 94 U/L (ref 40–150)
BUN: 13.7 mg/dL (ref 7.0–26.0)
CALCIUM: 9.4 mg/dL (ref 8.4–10.4)
CHLORIDE: 103 meq/L (ref 98–109)
CO2: 27 mEq/L (ref 22–29)
Creatinine: 1.2 mg/dL (ref 0.7–1.3)
EGFR: 77 mL/min/{1.73_m2} — ABNORMAL LOW (ref >90)
Glucose: 105 mg/dl (ref 70–140)
POTASSIUM: 3.5 meq/L (ref 3.5–5.1)
Sodium: 141 mEq/L (ref 136–145)
Total Bilirubin: 0.6 mg/dL (ref 0.20–1.20)
Total Protein: 7.2 g/dL (ref 6.4–8.3)

## 2017-05-15 MED ORDER — DOCETAXEL CHEMO INJECTION 160 MG/16ML
75.0000 mg/m2 | Freq: Once | INTRAVENOUS | Status: AC
  Administered 2017-05-15: 130 mg via INTRAVENOUS
  Filled 2017-05-15: qty 13

## 2017-05-15 MED ORDER — SODIUM CHLORIDE 0.9% FLUSH
10.0000 mL | INTRAVENOUS | Status: DC | PRN
  Administered 2017-05-15: 10 mL
  Filled 2017-05-15: qty 10

## 2017-05-15 MED ORDER — DEXAMETHASONE SODIUM PHOSPHATE 10 MG/ML IJ SOLN
10.0000 mg | Freq: Once | INTRAMUSCULAR | Status: AC
  Administered 2017-05-15: 10 mg via INTRAVENOUS

## 2017-05-15 MED ORDER — HEPARIN SOD (PORK) LOCK FLUSH 100 UNIT/ML IV SOLN
500.0000 [IU] | Freq: Once | INTRAVENOUS | Status: AC | PRN
  Administered 2017-05-15: 500 [IU]
  Filled 2017-05-15: qty 5

## 2017-05-15 MED ORDER — SODIUM CHLORIDE 0.9% FLUSH
10.0000 mL | INTRAVENOUS | Status: DC | PRN
  Administered 2017-05-15: 10 mL via INTRAVENOUS
  Filled 2017-05-15: qty 10

## 2017-05-15 MED ORDER — SODIUM CHLORIDE 0.9 % IV SOLN
Freq: Once | INTRAVENOUS | Status: AC
  Administered 2017-05-15: 09:00:00 via INTRAVENOUS

## 2017-05-15 MED ORDER — DEXAMETHASONE SODIUM PHOSPHATE 10 MG/ML IJ SOLN
INTRAMUSCULAR | Status: AC
  Filled 2017-05-15: qty 1

## 2017-05-15 NOTE — Telephone Encounter (Signed)
Lab, flush and Chemo scheduled for 06/26/17, with Injections on 06/27/17, per 05/15/17 los. Lab, MD (9:15) and Chemo scheduled for 07/17/17, with Injections on 07/18/17, per 05/15/17 los. Date and time on 07/17/17, per Dr Alen Blew. Patient was given a copy of the AVS report and appointment schedule per 05/15/17 los.

## 2017-05-15 NOTE — Patient Instructions (Signed)
[facility] Discharge Instructions for Patients Receiving Chemotherapy  Today you received the following chemotherapy agents:  Taxotere.  To help prevent nausea and vomiting after your treatment, we encourage you to take your nausea medication as directed.   If you develop nausea and vomiting that is not controlled by your nausea medication, call the clinic.   BELOW ARE SYMPTOMS THAT SHOULD BE REPORTED IMMEDIATELY:  *FEVER GREATER THAN 100.5 F  *CHILLS WITH OR WITHOUT FEVER  NAUSEA AND VOMITING THAT IS NOT CONTROLLED WITH YOUR NAUSEA MEDICATION  *UNUSUAL SHORTNESS OF BREATH  *UNUSUAL BRUISING OR BLEEDING  TENDERNESS IN MOUTH AND THROAT WITH OR WITHOUT PRESENCE OF ULCERS  *URINARY PROBLEMS  *BOWEL PROBLEMS  UNUSUAL RASH Items with * indicate a potential emergency and should be followed up as soon as possible.  Feel free to call the clinic you have any questions or concerns. The clinic phone number is [phone].  Please show the [facility] at check-in to the Emergency Department and triage nurse.

## 2017-05-15 NOTE — Progress Notes (Signed)
Hematology and Oncology Follow Up Visit  Daniel Howard [id number] 01-14-1956 61 y.o. 05/15/2017 8:34 AM [place], [name], [name], [name], MD   Principle Diagnosis: 61 year old gentleman with prostate cancer diagnosed in March 2018. He was found to have a PSA of 31.3 and a large pelvic mass measuring 7.7 x 7.2 cm. He has diffuse pelvic adenopathy and bony disease.   Prior Therapy: He is status post biopsy on 03/16/2017 of a pelvic mass which confirmed the presence of prostate cancer.  Current therapy:  Androgen deprivation therapy ongoing at [facility].  Taxotere chemotherapy at 75 mg/m started on 04/24/2017. He is here for cycle 2 of planned 6 cycles of therapy.  Interim History: Daniel Howard presents today for a follow-up visit. Since the last visit, he had a Port-A-Cath inserted without complications. He received the first cycle of chemotherapy on 35/57/3220 without complications. He denied any nausea, neuropathy, excessive fatigue or tiredness. His appetite remained reasonable although he lost a few pounds. He continues to attend to her activities of daily living without any major decline. He denied any bone pain or pathological fractures.  He does not report any headaches, blurry vision, syncope or seizures. He does not report any fevers, chills, sweats or weight loss. He does not report any chest pain, palpitation, orthopnea or leg edema. He does not report any cough, wheezing or hemoptysis. He does not report any nausea, vomiting or abdominal pain. He does not report any frequency urgency or hesitancy. He is not report any skeletal complaints of arthralgias or myalgias. Remaining review of systems unremarkable  Medications: I have reviewed the patient's current medications.  Current Outpatient Prescriptions  Medication Sig Dispense Refill  . Adalimumab (HUMIRA PEN) 40 MG/0.8ML PNKT Inject 40 mg into the skin every 14 (fourteen) days. 2 each 6  . calcium carbonate (OS-CAL)  600 MG TABS tablet Take 1,200 mg by mouth daily with breakfast.    . Ferrous Sulfate 28 MG TABS Take 1 tablet by mouth daily.    [name] HYDROcodone-acetaminophen (NORCO/VICODIN) 5-325 MG tablet Take 1 tablet by mouth every 6 (six) hours as needed for moderate pain. 60 tablet 0  . lidocaine-prilocaine (EMLA) cream Apply 1 application topically as needed. Apply to portacath prior to chemotherapy. 30 g 0  . mirtazapine (REMERON) 15 MG tablet TAKE 1 TABLET BY MOUTH AT BEDTIME 90 tablet 1  . Multiple Vitamins-Minerals (CENTRUM SILVER ADULT 50+) TABS Take 1 tablet by mouth daily.    . prochlorperazine (COMPAZINE) 10 MG tablet Take 1 tablet (10 mg total) by mouth every 6 (six) hours as needed for nausea or vomiting. 30 tablet 0  . sodium chloride (OCEAN) 0.65 % SOLN nasal spray Place 2 sprays into both nostrils 3 (three) times daily. 59 mL 1   No current facility-administered medications for this visit.      Allergies:  Allergies  Allergen Reactions  . Penicillins Hives    Past Medical History, Surgical history, Social history, and Family History were reviewed and updated.  Physical Exam: Blood pressure (!) 101/58, pulse 99, temperature 98.7 F (37.1 C), temperature source Oral, resp. rate 18, height 5' 4.5" (1.638 m), weight 143 lb 8 oz (65.1 kg), SpO2 99 %. ECOG: 1 General appearance: alert and cooperative Head: Normocephalic, without obvious abnormality Neck: no adenopathy Lymph nodes: Cervical, supraclavicular, and axillary nodes normal. Heart:regular rate and rhythm, S1, S2 normal, no murmur, click, rub or gallop Lung:chest clear, no wheezing, rales, normal symmetric air entry Abdomin: soft, non-tender, without masses  or organomegaly EXT:no erythema, induration, or nodules   Lab Results: Lab Results  Component Value Date   WBC 14.6 (H) 05/15/2017   HGB 9.9 (L) 05/15/2017   HCT 29.5 (L) 05/15/2017   MCV 94.9 05/15/2017   PLT 422 (H) 05/15/2017     Chemistry      Component Value  Date/Time   NA 141 04/24/2017 0919   K 4.1 04/24/2017 0919   CL 106 03/16/2017 0732   CO2 24 04/24/2017 0919   BUN 18.1 04/24/2017 0919   CREATININE 1.2 04/24/2017 0919      Component Value Date/Time   CALCIUM 9.5 04/24/2017 0919   ALKPHOS 134 04/24/2017 0919   AST 21 04/24/2017 0919   ALT 12 04/24/2017 0919   BILITOT 0.84 04/24/2017 0919       Impression and Plan:   61 year old gentleman with the following issues:  1. Prostate cancer diagnosed in March 2018. He presented with advanced disease including pelvic mass, diffuse adenopathy as well as diffuse bony disease. He underwent a biopsy of a 7.7 x 7.2 mass of the right hemipelvis which confirmed the presence of prostate cancer. His PSA is 31.3. He has already been started on androgen deprivation therapy.  He is currently receiving Taxotere chemotherapy at 75 mg/m every 3 weeks for a total of 6 cycles. He received the last treatment without complications.  Risks and benefits of proceeding with cycle 2 was reviewed today and he is agreeable to proceed.  2. IV access: Port-A-Cath inserted without complications. This will be utilized for the duration of chemotherapy.  3. Neutropenia prophylaxis: He'll receive Neulasta after each chemotherapy.  4. Antiemetics: Prescription for antiemetics including Compazine is available to the patient.  5. Androgen deprivation therapy: This will continue indefinitely. He is currently receiving that at [facility] urology.  6. Follow-up: Will be in 3 weeks for next cycle of chemotherapy.     [facility], MD 5/25/20188:34 AM

## 2017-05-15 NOTE — Progress Notes (Signed)
Pt has been approved for the $200 Prostate and $400 [place].

## 2017-05-15 NOTE — Patient Instructions (Signed)

## 2017-05-16 ENCOUNTER — Ambulatory Visit (HOSPITAL_BASED_OUTPATIENT_CLINIC_OR_DEPARTMENT_OTHER): Payer: Medicare Other

## 2017-05-16 VITALS — BP 103/65 | HR 89 | Temp 98.1°F | Resp 18

## 2017-05-16 DIAGNOSIS — C61 Malignant neoplasm of prostate: Secondary | ICD-10-CM

## 2017-05-16 DIAGNOSIS — Z5189 Encounter for other specified aftercare: Secondary | ICD-10-CM

## 2017-05-16 LAB — PSA: Prostate Specific Ag, Serum: 0.9 ng/mL (ref 0.0–4.0)

## 2017-05-16 MED ORDER — PEGFILGRASTIM INJECTION 6 MG/0.6ML ~~LOC~~
6.0000 mg | PREFILLED_SYRINGE | Freq: Once | SUBCUTANEOUS | Status: AC
  Administered 2017-05-16: 6 mg via SUBCUTANEOUS

## 2017-05-29 ENCOUNTER — Encounter: Payer: Self-pay | Admitting: Internal Medicine

## 2017-05-29 ENCOUNTER — Ambulatory Visit (INDEPENDENT_AMBULATORY_CARE_PROVIDER_SITE_OTHER): Payer: Medicare Other | Admitting: Internal Medicine

## 2017-05-29 VITALS — BP 103/68 | HR 77 | Temp 97.2°F | Ht 64.5 in | Wt 143.0 lb

## 2017-05-29 DIAGNOSIS — K50118 Crohn's disease of large intestine with other complication: Secondary | ICD-10-CM | POA: Diagnosis not present

## 2017-05-29 NOTE — Progress Notes (Signed)
Primary Care Physician:  Alycia Rossetti, MD Primary Gastroenterologist:  Dr. Gala Romney  Pre-Procedure History & Physical: HPI:  Daniel Howard is a 61 y.o. male here for follow-up of complicated Crohn's disease  - now doing well on Humira. Unfortunately, progressing pelvic abnormalities on recent CT led to a diagnosis of metastatic prostate cancer. He is seeing oncology and urology. He is receiving chemotherapy. He has a Port-A-Cath.  As far as Crohn's disease is concerned, his ostomy is functioning well. He is not having any abdominal pain tolerating diet very well. No fever or chills.  Also, he seems to be tolerating his chemotherapy very well .  Past Medical History:  Diagnosis Date  . Abnormal finding of biliary tract    MRCP shows pancreatic/biliary tract dilation. EUS 2010 confirmed dilation but no chronic pancreaitis or mass. Vascular ectasia crimpoing distal CBD.   [name] Anxiety   . 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 Duodenal ulcer 2010   nsaids  . Low back pain   . Peristomal hernia   . Small bowel obstruction ([place])   . Spigelian hernia    bilateral    Past Surgical History:  Procedure Laterality Date  . APPENDECTOMY  1992   at time of exp laparotomy at which time he was noted to have fistulizing Crohn's rather than UC  . COLONOSCOPY [name] 08/23/2014   BJY:NWGNFAO proctoscopy with possible fistulous opening in thebase of rectal/anal stump.    . COLOSTOMY  1992   transverse loop colostomy secondary to a stricture  . ESOPHAGOGASTRODUODENOSCOPY  05/2009   SLF: multiple antral erosions, large ulcer at ansatomosis (postsurgical changes  at duodenal bulb and second portion of duodenum) BX c/x NSAIDS.  . EUS  10/04/2009   Dr. Estill Bakes dilated CBD and main pancreatic duct.  No pancreatic  . FLEXIBLE SIGMOIDOSCOPY  1988   Dr. Laural Golden- suggested rohn's disease but the biopsies were not collaborative.  [name] FLEXIBLE SIGMOIDOSCOPY [name] 09/08/2016   Procedure: FLEXIBLE SIGMOIDOSCOPY;  Surgeon: Wonda Horner, MD;  Location: [facility] ENDOSCOPY;  Service: Gastroenterology;  Laterality: [name];  . HEMICOLOECTOMY W/ ANASTOMOSIS  1993   R- Dr.DeMason   . HERNIA REPAIR  1996   incarcerated periostial hernia with additional surgery in 1999  . IR FLUORO GUIDE PORT INSERTION RIGHT  04/03/2017  . IR US GUIDE VASC ACCESS RIGHT  04/03/2017    Prior to Admission medications   Medication Sig Start Date End Date Taking? Authorizing Provider  Adalimumab (HUMIRA PEN) 40 MG/0.8ML PNKT Inject 40 mg into the skin every 14 (fourteen) days. 12/19/16  Yes Annitta Needs, NP  calcium carbonate (OS-CAL) 600 MG TABS tablet Take 1,200 mg by mouth daily with breakfast.   Yes [provider]  Ferrous Sulfate 28 MG TABS Take 1 tablet by mouth daily.   Yes [provider]  HYDROcodone-acetaminophen (NORCO/VICODIN) 5-325 MG tablet Take 1 tablet by mouth every 6 (six) hours as needed for moderate pain. 03/04/17  Yes [place], [name], MD  lidocaine-prilocaine (EMLA) cream Apply 1 application topically as needed. Apply to portacath prior to chemotherapy. 04/02/17  Yes [name], MD  mirtazapine (REMERON) 15 MG tablet TAKE 1 TABLET BY MOUTH  AT BEDTIME 04/22/17  Yes [place], [name], MD  Multiple Vitamins-Minerals (CENTRUM SILVER ADULT 50+) TABS Take 1 tablet by mouth daily.   Yes [provider]  prochlorperazine (COMPAZINE) 10 MG tablet Take 1 tablet (10 mg total) by mouth every 6 (six) hours as needed for nausea or vomiting. 04/02/17  Yes Shadad, Mathis Dad, MD  sodium chloride (OCEAN) 0.65 % SOLN nasal spray Place 2 sprays into both nostrils 3 (three)  times daily. 12/04/16  Ezekiel Slocumb, MD    Allergies as of 05/29/2017 - Review Complete 05/29/2017  Allergen Reaction Noted  . Penicillins Hives 01/25/2007    Family History  Problem Relation Age of Onset  . Cancer Father        prostate   . Prostate cancer Father   . Hypertension Sister   . Cancer Sister   . Depression Sister   . Breast cancer Sister   . COPD Sister   . Aneurysm Brother        deceased, brain aneurysm    Social History   Social History  . Marital status: Married    Spouse name: [name]  . Number of children: 27  . Years of education: [name]   Occupational History  . disabled Unemployed   Social History Main Topics  . Smoking status: Former Smoker    Packs/day: 0.50    Years: 2.00    Types: Cigarettes    Quit date: 12/21/2009  . Smokeless tobacco: Never Used     Comment: Quit abut 20 years  . Alcohol use No     Comment: Former drinker  . Drug use: No  . Sexual activity: Yes    Partners: Female   Other Topics Concern  . Not on file   Social History Narrative  . No narrative on file    Review of Systems: See HPI, otherwise negative ROS  Physical Exam: BP 103/68   Pulse 77   Temp 97.2 F (36.2 C) (Oral)   Ht 5' 4.5" (1.638 m)   Wt 143 lb (64.9 kg)   BMI 24.17 kg/m  General:   Alert,  pleasant and cooperative in NAD. Accompanied by spouse. Neck:  Supple; no masses or thyromegaly. No significant cervical adenopathy. Lungs:  Clear throughout to auscultation.   No wheezes, crackles, or rhonchi. No acute distress. Heart:  Regular rate and rhythm; no murmurs, clicks, rubs,  or gallops. Abdomen: Multiple surgical scars present. Ostomy appears healthy. Brown stool in bag. Non-distended, normal bowel sounds.  Soft and nontender without appreciable mass or hepatosplenomegaly.  Pulses:  Normal pulses noted. Extremities:  Without clubbing or edema.  Impression:  Long history of complicated Crohn's disease  -  now clinically in remission on  Humira. Recently diagnosed metastatic prostate cancer undergoing chemotherapy but no radiation therapy.  Recommendations:  Continue Humira  Call with any problems such as fever, etc  Office visit with [place] in 6 months         Notice: This dictation was prepared with Dragon dictation along with smaller phrase technology. Any transcriptional errors that result from this process are unintentional and may not be corrected upon review.

## 2017-05-29 NOTE — Patient Instructions (Signed)
Continue humira  Call with any problems such as fever, etc  Office visit with [place] in 6 months

## 2017-06-01 DIAGNOSIS — C61 Malignant neoplasm of prostate: Secondary | ICD-10-CM | POA: Diagnosis not present

## 2017-06-01 DIAGNOSIS — R972 Elevated prostate specific antigen [PSA]: Secondary | ICD-10-CM | POA: Diagnosis not present

## 2017-06-05 ENCOUNTER — Other Ambulatory Visit (HOSPITAL_BASED_OUTPATIENT_CLINIC_OR_DEPARTMENT_OTHER): Payer: Medicare Other

## 2017-06-05 ENCOUNTER — Ambulatory Visit (HOSPITAL_BASED_OUTPATIENT_CLINIC_OR_DEPARTMENT_OTHER): Payer: Medicare Other

## 2017-06-05 ENCOUNTER — Ambulatory Visit (HOSPITAL_BASED_OUTPATIENT_CLINIC_OR_DEPARTMENT_OTHER): Payer: Medicare Other | Admitting: Oncology

## 2017-06-05 ENCOUNTER — Ambulatory Visit: Payer: Medicare Other

## 2017-06-05 ENCOUNTER — Telehealth: Payer: Self-pay | Admitting: Oncology

## 2017-06-05 VITALS — BP 113/65 | HR 64 | Temp 98.2°F | Resp 17 | Ht 64.5 in | Wt 142.2 lb

## 2017-06-05 DIAGNOSIS — C7951 Secondary malignant neoplasm of bone: Secondary | ICD-10-CM

## 2017-06-05 DIAGNOSIS — C61 Malignant neoplasm of prostate: Secondary | ICD-10-CM | POA: Diagnosis not present

## 2017-06-05 DIAGNOSIS — Z5111 Encounter for antineoplastic chemotherapy: Secondary | ICD-10-CM

## 2017-06-05 DIAGNOSIS — Z95828 Presence of other vascular implants and grafts: Secondary | ICD-10-CM

## 2017-06-05 LAB — COMPREHENSIVE METABOLIC PANEL
ALT: 12 U/L (ref 0–55)
ANION GAP: 9 meq/L (ref 3–11)
AST: 18 U/L (ref 5–34)
Albumin: 3.3 g/dL — ABNORMAL LOW (ref 3.5–5.0)
Alkaline Phosphatase: 92 U/L (ref 40–150)
BUN: 13.8 mg/dL (ref 7.0–26.0)
CHLORIDE: 105 meq/L (ref 98–109)
CO2: 25 meq/L (ref 22–29)
CREATININE: 1 mg/dL (ref 0.7–1.3)
Calcium: 9.3 mg/dL (ref 8.4–10.4)
GLUCOSE: 105 mg/dL (ref 70–140)
Potassium: 3.9 mEq/L (ref 3.5–5.1)
SODIUM: 139 meq/L (ref 136–145)
Total Bilirubin: 0.68 mg/dL (ref 0.20–1.20)
Total Protein: 7.9 g/dL (ref 6.4–8.3)

## 2017-06-05 LAB — CBC WITH DIFFERENTIAL/PLATELET
BASO%: 0.3 % (ref 0.0–2.0)
Basophils Absolute: 0 10*3/uL (ref 0.0–0.1)
EOS%: 0.8 % (ref 0.0–7.0)
Eosinophils Absolute: 0.1 10*3/uL (ref 0.0–0.5)
HCT: 31.2 % — ABNORMAL LOW (ref 38.4–49.9)
HGB: 10.2 g/dL — ABNORMAL LOW (ref 13.0–17.1)
LYMPH%: 12.9 % — AB (ref 14.0–49.0)
MCH: 32.4 pg (ref 27.2–33.4)
MCHC: 32.7 g/dL (ref 32.0–36.0)
MCV: 99 fL — ABNORMAL HIGH (ref 79.3–98.0)
MONO#: 1 10*3/uL — AB (ref 0.1–0.9)
MONO%: 9 % (ref 0.0–14.0)
NEUT#: 8.9 10*3/uL — ABNORMAL HIGH (ref 1.5–6.5)
NEUT%: 77 % — AB (ref 39.0–75.0)
Platelets: 317 10*3/uL (ref 140–400)
RBC: 3.15 10*6/uL — AB (ref 4.20–5.82)
RDW: 15.4 % — ABNORMAL HIGH (ref 11.0–14.6)
WBC: 11.6 10*3/uL — AB (ref 4.0–10.3)
lymph#: 1.5 10*3/uL (ref 0.9–3.3)

## 2017-06-05 MED ORDER — SODIUM CHLORIDE 0.9 % IV SOLN: Freq: Once | INTRAVENOUS | Status: DC

## 2017-06-05 MED ORDER — SODIUM CHLORIDE 0.9 % IV SOLN
Freq: Once | INTRAVENOUS | Status: AC
  Administered 2017-06-05: 13:00:00 via INTRAVENOUS

## 2017-06-05 MED ORDER — DEXAMETHASONE SODIUM PHOSPHATE 10 MG/ML IJ SOLN
10.0000 mg | Freq: Once | INTRAMUSCULAR | Status: AC
  Administered 2017-06-05: 10 mg via INTRAVENOUS

## 2017-06-05 MED ORDER — HEPARIN SOD (PORK) LOCK FLUSH 100 UNIT/ML IV SOLN
500.0000 [IU] | Freq: Once | INTRAVENOUS | Status: AC | PRN
  Administered 2017-06-05: 500 [IU]
  Filled 2017-06-05: qty 5

## 2017-06-05 MED ORDER — SODIUM CHLORIDE 0.9% FLUSH
10.0000 mL | INTRAVENOUS | Status: DC | PRN
  Administered 2017-06-05: 10 mL via INTRAVENOUS
  Filled 2017-06-05: qty 10

## 2017-06-05 MED ORDER — DEXAMETHASONE SODIUM PHOSPHATE 10 MG/ML IJ SOLN
INTRAMUSCULAR | Status: AC
  Filled 2017-06-05: qty 1

## 2017-06-05 MED ORDER — SODIUM CHLORIDE 0.9% FLUSH
10.0000 mL | INTRAVENOUS | Status: DC | PRN
  Administered 2017-06-05: 10 mL
  Filled 2017-06-05: qty 10

## 2017-06-05 MED ORDER — DOCETAXEL CHEMO INJECTION 160 MG/16ML
75.0000 mg/m2 | Freq: Once | INTRAVENOUS | Status: AC
  Administered 2017-06-05: 130 mg via INTRAVENOUS
  Filled 2017-06-05: qty 13

## 2017-06-05 MED ORDER — SODIUM CHLORIDE 0.9 % IV SOLN
Freq: Once | INTRAVENOUS | Status: AC
  Administered 2017-06-05: 11:00:00 via INTRAVENOUS

## 2017-06-05 MED ORDER — ONDANSETRON HCL 4 MG/2ML IJ SOLN
INTRAMUSCULAR | Status: AC
  Filled 2017-06-05: qty 4

## 2017-06-05 MED ORDER — ONDANSETRON HCL 4 MG/2ML IJ SOLN: 8.0000 mg | Freq: Once | INTRAMUSCULAR | Status: DC

## 2017-06-05 MED ORDER — ONDANSETRON HCL 4 MG/2ML IJ SOLN
8.0000 mg | Freq: Once | INTRAMUSCULAR | Status: AC
  Administered 2017-06-05: 8 mg via INTRAVENOUS

## 2017-06-05 NOTE — Telephone Encounter (Signed)
Scheduled appt per 6/15 los - Gave patient AVS and calender per LOS.

## 2017-06-05 NOTE — Progress Notes (Signed)
Hematology and Oncology Follow Up Visit  Daniel Howard [id number] 1956/11/05 61 y.o. 06/05/2017 9:19 AM , Daniel Howard, [name], Daniel Nunnery, MD   Principle Diagnosis: 61 year old gentleman with prostate cancer diagnosed in March 2018. He was found to have a PSA of 31.3 and a large pelvic mass measuring 7.7 x 7.2 cm. He has diffuse pelvic adenopathy and bony disease.   Prior Therapy: He is status post biopsy on 03/16/2017 of a pelvic mass which confirmed the presence of prostate cancer.  Current therapy:  Androgen deprivation therapy ongoing at [facility].  Taxotere chemotherapy at 75 mg/m started on 04/24/2017. He is here for cycle 3 of planned 6 cycles of therapy.  Interim History: [name] presents today for a follow-up visit. Since the last visit, he reports that a few GI symptoms related to his Crohn's disease. He has reported some abdominal discomfort and periodic constipation. He tolerated chemotherapy without complications. His last cycle of chemotherapy 3 weeks ago was administered without any nausea, vomiting or peripheral neuropathy. His performance status activity level have been reasonably unchanged. He is feeling slightly weak today because of poor by mouth intake related to his Crohn's disease.   He does not report any headaches, blurry vision, syncope or seizures. He does not report any fevers, chills, sweats or weight loss. He does not report any chest pain, palpitation, orthopnea or leg edema. He does not report any cough, wheezing or hemoptysis. He does not report any nausea, vomiting or abdominal pain. He does not report any frequency urgency or hesitancy. He is not report any skeletal complaints of arthralgias or myalgias. Remaining review of systems unremarkable  Medications: I have reviewed the patient's current medications.  Current Outpatient Prescriptions  Medication Sig Dispense Refill  . Adalimumab (HUMIRA PEN) 40 MG/0.8ML PNKT Inject 40 mg into the  skin every 14 (fourteen) days. 2 each 6  . calcium carbonate (OS-CAL) 600 MG TABS tablet Take 1,200 mg by mouth daily with breakfast.    . Ferrous Sulfate 28 MG TABS Take 1 tablet by mouth daily.    [name] HYDROcodone-acetaminophen (NORCO/VICODIN) 5-325 MG tablet Take 1 tablet by mouth every 6 (six) hours as needed for moderate pain. 60 tablet 0  . lidocaine-prilocaine (EMLA) cream Apply 1 application topically as needed. Apply to portacath prior to chemotherapy. 30 g 0  . mirtazapine (REMERON) 15 MG tablet TAKE 1 TABLET BY MOUTH AT BEDTIME 90 tablet 1  . Multiple Vitamins-Minerals (CENTRUM SILVER ADULT 50+) TABS Take 1 tablet by mouth daily.    . prochlorperazine (COMPAZINE) 10 MG tablet Take 1 tablet (10 mg total) by mouth every 6 (six) hours as needed for nausea or vomiting. 30 tablet 0  . sodium chloride (OCEAN) 0.65 % SOLN nasal spray Place 2 sprays into both nostrils 3 (three) times daily. 59 mL 1   No current facility-administered medications for this visit.      Allergies:  Allergies  Allergen Reactions  . Penicillins Hives    Past Medical History, Surgical history, Social history, and Family History were reviewed and updated.  Physical Exam: Blood pressure 113/65, pulse 64, temperature 98.2 F (36.8 C), temperature source Oral, resp. rate 17, height 5' 4.5" (1.638 m), weight 142 lb 3.2 oz (64.5 kg), SpO2 100 %. ECOG: 1 General appearance: alert and cooperative appeared without distress. Head: Normocephalic, without obvious abnormality no oral ulcers or lesions. Neck: no adenopathy Lymph nodes: Cervical, supraclavicular, and axillary nodes normal. Heart:regular rate and rhythm, S1, S2 normal, no murmur,  click, rub or gallop Lung:chest clear, no wheezing, rales, normal symmetric air entry Abdomin: soft, non-tender, without masses or organomegaly slightly tender to palpation. Good bowel sounds. EXT:no erythema, induration, or nodules   Lab Results: Lab Results  Component Value  Date   WBC 11.6 (H) 06/05/2017   HGB 10.2 (L) 06/05/2017   HCT 31.2 (L) 06/05/2017   MCV 99.0 (H) 06/05/2017   PLT 317 06/05/2017     Chemistry      Component Value Date/Time   NA 139 06/05/2017 0815   K 3.9 06/05/2017 0815   CL 106 03/16/2017 0732   CO2 25 06/05/2017 0815   BUN 13.8 06/05/2017 0815   CREATININE 1.0 06/05/2017 0815      Component Value Date/Time   CALCIUM 9.3 06/05/2017 0815   ALKPHOS 92 06/05/2017 0815   AST 18 06/05/2017 0815   ALT 12 06/05/2017 0815   BILITOT 0.68 06/05/2017 0815     Results for DATRON, BRAKEBILL (MRN [id number]) as of 06/05/2017 08:31  Ref. Range 02/11/2017 16:40 04/24/2017 09:20 05/15/2017 07:41  PSA Latest Ref Range: 0.0 - 4.0 ng/mL 31.3 (H) 1.8 0.9    Impression and Plan:   61 year old gentleman with the following issues:  1. Prostate cancer diagnosed in March 2018. He presented with advanced disease including pelvic mass, diffuse adenopathy as well as diffuse bony disease. He underwent a biopsy of a 7.7 x 7.2 mass of the right hemipelvis which confirmed the presence of prostate cancer. His PSA is 31.3. He has already been started on androgen deprivation therapy.  He is currently receiving Taxotere chemotherapy at 75 mg/m every 3 weeks for a total of 6 cycles. He received the last treatment without complications.  He did not report any complications related to chemotherapy. I offered to defer cycle 3 of chemotherapy today because of his abdominal issues related to Crohn's disease. He prefers to proceed with chemotherapy at this time he feels that is well enough to receive it. We will add gentle hydration to chemotherapy to ensure better tolerance.  He will receive the next cycle of chemotherapy in 3 weeks. Cycle 5 as scheduled for 07/17/2017 and his last cycle scheduled for 08/07/2017.  2. IV access: Port-A-Cath inserted without complications. This will be utilized for the duration of chemotherapy.  3. Neutropenia prophylaxis: He'll  receive Neulasta after each chemotherapy.  4. Antiemetics: Prescription for antiemetics including Compazine is available to the patient.  5. Androgen deprivation therapy: This will continue indefinitely. He is currently receiving that at [facility] urology.  6. Follow-up: Will be in 3 weeks for next cycle of chemotherapy.     [facility], MD 6/15/20189:19 AM

## 2017-06-05 NOTE — Patient Instructions (Signed)
[place] Discharge Instructions for Patients Receiving Chemotherapy  Today you received the following chemotherapy agents:  Taxotere.  To help prevent nausea and vomiting after your treatment, we encourage you to take your nausea medication as directed.   If you develop nausea and vomiting that is not controlled by your nausea medication, call the clinic.   BELOW ARE SYMPTOMS THAT SHOULD BE REPORTED IMMEDIATELY:  *FEVER GREATER THAN 100.5 F  *CHILLS WITH OR WITHOUT FEVER  NAUSEA AND VOMITING THAT IS NOT CONTROLLED WITH YOUR NAUSEA MEDICATION  *UNUSUAL SHORTNESS OF BREATH  *UNUSUAL BRUISING OR BLEEDING  TENDERNESS IN MOUTH AND THROAT WITH OR WITHOUT PRESENCE OF ULCERS  *URINARY PROBLEMS  *BOWEL PROBLEMS  UNUSUAL RASH Items with * indicate a potential emergency and should be followed up as soon as possible.  Feel free to call the clinic you have any questions or concerns. The clinic phone number is [phone].  Please show the [place] at check-in to the Emergency Department and triage nurse.

## 2017-06-05 NOTE — Patient Instructions (Signed)

## 2017-06-06 ENCOUNTER — Ambulatory Visit: Payer: Medicare Other

## 2017-06-06 LAB — PSA: Prostate Specific Ag, Serum: 0.8 ng/mL (ref 0.0–4.0)

## 2017-06-08 ENCOUNTER — Ambulatory Visit (HOSPITAL_BASED_OUTPATIENT_CLINIC_OR_DEPARTMENT_OTHER): Payer: Medicare Other

## 2017-06-08 VITALS — BP 105/60 | HR 95 | Temp 97.2°F | Resp 18

## 2017-06-08 DIAGNOSIS — C61 Malignant neoplasm of prostate: Secondary | ICD-10-CM

## 2017-06-08 DIAGNOSIS — Z5189 Encounter for other specified aftercare: Secondary | ICD-10-CM

## 2017-06-08 MED ORDER — PEGFILGRASTIM INJECTION 6 MG/0.6ML ~~LOC~~
6.0000 mg | PREFILLED_SYRINGE | Freq: Once | SUBCUTANEOUS | Status: AC
  Administered 2017-06-08: 6 mg via SUBCUTANEOUS
  Filled 2017-06-08: qty 0.6

## 2017-06-08 NOTE — Patient Instructions (Signed)
Pegfilgrastim injection What is this medicine? PEGFILGRASTIM (PEG fil gra stim) is a long-acting granulocyte colony-stimulating factor that stimulates the growth of neutrophils, a type of white blood cell important in the body's fight against infection. It is used to reduce the incidence of fever and infection in patients with certain types of cancer who are receiving chemotherapy that affects the bone marrow, and to increase survival after being exposed to high doses of radiation. This medicine may be used for other purposes; ask your health care provider or pharmacist if you have questions. COMMON BRAND NAME(S): Neulasta What should I tell my health care provider before I take this medicine? They need to know if you have any of these conditions: -kidney disease -latex allergy -ongoing radiation therapy -sickle cell disease -skin reactions to acrylic adhesives (On-Body Injector only) -an unusual or allergic reaction to pegfilgrastim, filgrastim, other medicines, foods, dyes, or preservatives -pregnant or trying to get pregnant -breast-feeding How should I use this medicine? This medicine is for injection under the skin. If you get this medicine at home, you will be taught how to prepare and give the pre-filled syringe or how to use the On-body Injector. Refer to the patient Instructions for Use for detailed instructions. Use exactly as directed. Tell your healthcare provider immediately if you suspect that the On-body Injector may not have performed as intended or if you suspect the use of the On-body Injector resulted in a missed or partial dose. It is important that you put your used needles and syringes in a special sharps container. Do not put them in a trash can. If you do not have a sharps container, call your pharmacist or healthcare provider to get one. Talk to your pediatrician regarding the use of this medicine in children. While this drug may be prescribed for selected conditions,  precautions do apply. Overdosage: If you think you have taken too much of this medicine contact a poison control center or emergency room at once. NOTE: This medicine is only for you. Do not share this medicine with others. What if I miss a dose? It is important not to miss your dose. Call your doctor or health care professional if you miss your dose. If you miss a dose due to an On-body Injector failure or leakage, a new dose should be administered as soon as possible using a single prefilled syringe for manual use. What may interact with this medicine? Interactions have not been studied. Give your health care provider a list of all the medicines, herbs, non-prescription drugs, or dietary supplements you use. Also tell them if you smoke, drink alcohol, or use illegal drugs. Some items may interact with your medicine. This list may not describe all possible interactions. Give your health care provider a list of all the medicines, herbs, non-prescription drugs, or dietary supplements you use. Also tell them if you smoke, drink alcohol, or use illegal drugs. Some items may interact with your medicine. What should I watch for while using this medicine? You may need blood work done while you are taking this medicine. If you are going to need a MRI, CT scan, or other procedure, tell your doctor that you are using this medicine (On-Body Injector only). What side effects may I notice from receiving this medicine? Side effects that you should report to your doctor or health care professional as soon as possible: -allergic reactions like skin rash, itching or hives, swelling of the face, lips, or tongue -dizziness -fever -pain, redness, or irritation at site  where injected -pinpoint red spots on the skin -red or dark-brown urine -shortness of breath or breathing problems -stomach or side pain, or pain at the shoulder -swelling -tiredness -trouble passing urine or change in the amount of urine Side  effects that usually do not require medical attention (report to your doctor or health care professional if they continue or are bothersome): -bone pain -muscle pain This list may not describe all possible side effects. Call your doctor for medical advice about side effects. You may report side effects to FDA at 1-800-FDA-1088. Where should I keep my medicine? Keep out of the reach of children. Store pre-filled syringes in a refrigerator between 2 and 8 degrees C (36 and 46 degrees F). Do not freeze. Keep in carton to protect from light. Throw away this medicine if it is left out of the refrigerator for more than 48 hours. Throw away any unused medicine after the expiration date. NOTE: This sheet is a summary. It may not cover all possible information. If you have questions about this medicine, talk to your doctor, pharmacist, or health care provider.  2018 Elsevier/Gold Standard (2016-12-04 12:58:03)

## 2017-06-23 ENCOUNTER — Telehealth: Payer: Self-pay | Admitting: Family Medicine

## 2017-06-23 MED ORDER — HYDROCODONE-ACETAMINOPHEN 5-325 MG PO TABS: 1.0000 | ORAL_TABLET | Freq: Four times a day (QID) | ORAL | 0 refills | Status: DC | PRN

## 2017-06-23 NOTE — Telephone Encounter (Signed)
Patient needs rx for his hydrocodone  [phone]

## 2017-06-23 NOTE — Telephone Encounter (Signed)
Prescription printed and patient made aware to come to office to pick up.

## 2017-06-23 NOTE — Telephone Encounter (Signed)
Okay to refill? 

## 2017-06-23 NOTE — Telephone Encounter (Signed)
Ok to refill??  Last office visit/ refill 03/04/2017.

## 2017-06-26 ENCOUNTER — Ambulatory Visit (HOSPITAL_BASED_OUTPATIENT_CLINIC_OR_DEPARTMENT_OTHER): Payer: Medicare Other

## 2017-06-26 ENCOUNTER — Ambulatory Visit: Payer: Medicare Other

## 2017-06-26 ENCOUNTER — Other Ambulatory Visit: Payer: Medicare Other

## 2017-06-26 ENCOUNTER — Other Ambulatory Visit (HOSPITAL_BASED_OUTPATIENT_CLINIC_OR_DEPARTMENT_OTHER): Payer: Medicare Other

## 2017-06-26 ENCOUNTER — Ambulatory Visit (HOSPITAL_BASED_OUTPATIENT_CLINIC_OR_DEPARTMENT_OTHER): Payer: Medicare Other | Admitting: Nurse Practitioner

## 2017-06-26 VITALS — BP 115/66 | HR 79 | Temp 98.1°F | Resp 18 | Ht 64.5 in | Wt 139.0 lb

## 2017-06-26 DIAGNOSIS — C61 Malignant neoplasm of prostate: Secondary | ICD-10-CM

## 2017-06-26 DIAGNOSIS — Z5111 Encounter for antineoplastic chemotherapy: Secondary | ICD-10-CM | POA: Diagnosis not present

## 2017-06-26 DIAGNOSIS — C7951 Secondary malignant neoplasm of bone: Secondary | ICD-10-CM

## 2017-06-26 LAB — CBC WITH DIFFERENTIAL/PLATELET
BASO%: 0.4 % (ref 0.0–2.0)
BASOS ABS: 0 10*3/uL (ref 0.0–0.1)
EOS%: 1.2 % (ref 0.0–7.0)
Eosinophils Absolute: 0.1 10*3/uL (ref 0.0–0.5)
HCT: 33.2 % — ABNORMAL LOW (ref 38.4–49.9)
HEMOGLOBIN: 10.9 g/dL — AB (ref 13.0–17.1)
LYMPH%: 23 % (ref 14.0–49.0)
MCH: 32.8 pg (ref 27.2–33.4)
MCHC: 32.8 g/dL (ref 32.0–36.0)
MCV: 100 fL — AB (ref 79.3–98.0)
MONO#: 0.7 10*3/uL (ref 0.1–0.9)
MONO%: 8.6 % (ref 0.0–14.0)
NEUT#: 5.1 10*3/uL (ref 1.5–6.5)
NEUT%: 66.8 % (ref 39.0–75.0)
NRBC: 0 % (ref 0–0)
PLATELETS: 267 10*3/uL (ref 140–400)
RBC: 3.32 10*6/uL — AB (ref 4.20–5.82)
RDW: 15.5 % — AB (ref 11.0–14.6)
WBC: 7.7 10*3/uL (ref 4.0–10.3)
lymph#: 1.8 10*3/uL (ref 0.9–3.3)

## 2017-06-26 LAB — COMPREHENSIVE METABOLIC PANEL
ALBUMIN: 3.2 g/dL — AB (ref 3.5–5.0)
ALK PHOS: 78 U/L (ref 40–150)
ALT: 12 U/L (ref 0–55)
ANION GAP: 9 meq/L (ref 3–11)
AST: 16 U/L (ref 5–34)
BILIRUBIN TOTAL: 0.58 mg/dL (ref 0.20–1.20)
BUN: 17.1 mg/dL (ref 7.0–26.0)
CO2: 24 mEq/L (ref 22–29)
Calcium: 9.2 mg/dL (ref 8.4–10.4)
Chloride: 107 mEq/L (ref 98–109)
Creatinine: 1.2 mg/dL (ref 0.7–1.3)
EGFR: 79 mL/min/{1.73_m2} — AB (ref >90)
Glucose: 108 mg/dl (ref 70–140)
POTASSIUM: 3.4 meq/L — AB (ref 3.5–5.1)
SODIUM: 140 meq/L (ref 136–145)
TOTAL PROTEIN: 7.3 g/dL (ref 6.4–8.3)

## 2017-06-26 MED ORDER — DEXAMETHASONE SODIUM PHOSPHATE 10 MG/ML IJ SOLN
10.0000 mg | Freq: Once | INTRAMUSCULAR | Status: AC
  Administered 2017-06-26: 10 mg via INTRAVENOUS

## 2017-06-26 MED ORDER — ONDANSETRON HCL 4 MG/2ML IJ SOLN
8.0000 mg | Freq: Once | INTRAMUSCULAR | Status: AC
  Administered 2017-06-26: 8 mg via INTRAVENOUS

## 2017-06-26 MED ORDER — HEPARIN SOD (PORK) LOCK FLUSH 100 UNIT/ML IV SOLN
500.0000 [IU] | Freq: Once | INTRAVENOUS | Status: AC | PRN
  Administered 2017-06-26: 500 [IU]
  Filled 2017-06-26: qty 5

## 2017-06-26 MED ORDER — SODIUM CHLORIDE 0.9% FLUSH
10.0000 mL | INTRAVENOUS | Status: DC | PRN
  Administered 2017-06-26: 10 mL
  Filled 2017-06-26: qty 10

## 2017-06-26 MED ORDER — DEXAMETHASONE SODIUM PHOSPHATE 10 MG/ML IJ SOLN
INTRAMUSCULAR | Status: AC
  Filled 2017-06-26: qty 1

## 2017-06-26 MED ORDER — SODIUM CHLORIDE 0.9 % IV SOLN
Freq: Once | INTRAVENOUS | Status: AC
  Administered 2017-06-26: 11:00:00 via INTRAVENOUS

## 2017-06-26 MED ORDER — ONDANSETRON HCL 4 MG/2ML IJ SOLN
INTRAMUSCULAR | Status: AC
  Filled 2017-06-26: qty 4

## 2017-06-26 MED ORDER — DOCETAXEL CHEMO INJECTION 160 MG/16ML
75.0000 mg/m2 | Freq: Once | INTRAVENOUS | Status: AC
  Administered 2017-06-26: 130 mg via INTRAVENOUS
  Filled 2017-06-26: qty 13

## 2017-06-26 NOTE — Addendum Note (Signed)
Addended by: Owens Shark on: 06/26/2017 10:24 AM   Modules accepted: Orders

## 2017-06-26 NOTE — Patient Instructions (Signed)

## 2017-06-26 NOTE — Progress Notes (Signed)
  [name] OFFICE PROGRESS NOTE  Principle Diagnosis: 61 year old gentleman with prostate cancer diagnosed in March 2018. He was found to have a PSA of 31.3 and a large pelvic mass measuring 7.7 x 7.2 cm. He has diffuse pelvic adenopathy and bony disease.   Prior Therapy: He is status post biopsy on 03/16/2017 of a pelvic mass which confirmed the presence of prostate cancer.  Current therapy:  Androgen deprivation therapy ongoing at [facility].  Taxotere chemotherapy at 75 mg/m started on 04/24/2017. He is here for cycle 3 of planned 6 cycles of therapy.    INTERVAL HISTORY:   Daniel Howard returns as scheduled. He completed cycle 3 Taxotere 06/05/2017. He denies nausea/vomiting. No mouth sores. No diarrhea. No numbness or tingling in his hands or feet. No pain. He has a good appetite. He notes darkening of his nails.  Objective:  Vital signs in last 24 hours:  Blood pressure 115/66, pulse 79, temperature 98.1 F (36.7 C), temperature source Oral, resp. rate 18, height 5' 4.5" (1.638 m), weight 139 lb (63 kg), SpO2 100 %.    HEENT: No thrush or ulcers. Resp: Lungs clear bilaterally. Cardio: Regular rate and rhythm. GI: No hepatomegaly. Ileostomy. Vascular: No leg edema. Neuro: Vibratory sense intact over the fingertips per tuning fork exam.  Skin: Nail beds with discoloration.  Port-A-Cath without erythema.  Lab Results:  Lab Results  Component Value Date   WBC 7.7 06/26/2017   HGB 10.9 (L) 06/26/2017   HCT 33.2 (L) 06/26/2017   MCV 100.0 (H) 06/26/2017   PLT 267 06/26/2017   NEUTROABS 5.1 06/26/2017    Imaging:  No results found.  Medications: I have reviewed the patient's current medications.  Assessment/Plan: 1. Prostate cancer diagnosed in March 2018. He presented with advanced disease including pelvic mass, diffuse adenopathy as well as diffuse bony disease. He underwent a biopsy of a 7.7 x 7.2 mass of the right hemipelvis which  confirmed the presence of prostate cancer. His PSA is 31.3. He is on androgen deprivation therapy.  He is currently receiving Taxotere chemotherapy at 75 mg/m every 3 weeks for a total of 6 cycles.   He has completed 3 cycles of Taxotere. Cycle 4 today. Cycle 5 is scheduled for 07/17/2017 and his last cycle is scheduled for 08/07/2017.  2. IV access: Port-A-Cath.  3. Neutropenia prophylaxis: He receives Neulasta after each chemotherapy.  4. Antiemetics:  Compazine as needed.  5. Androgen deprivation therapy: This will continue indefinitely. He is currently receiving at [facility] urology.  6. Follow-up: Will be in 3 weeks for next cycle of chemotherapy.   Disposition: Daniel Howard appears stable. He has completed 3 cycles of Taxotere. Plan to proceed with cycle 4 today as scheduled. He will return for a follow-up visit and cycle 5 in 3 weeks. He will contact the office in the interim with any problems.    Daniel Howard ANP/GNP-BC   06/26/2017  9:54 AM

## 2017-06-27 ENCOUNTER — Ambulatory Visit (HOSPITAL_BASED_OUTPATIENT_CLINIC_OR_DEPARTMENT_OTHER): Payer: Medicare Other

## 2017-06-27 VITALS — BP 108/62 | HR 80 | Temp 98.5°F | Resp 16

## 2017-06-27 DIAGNOSIS — C61 Malignant neoplasm of prostate: Secondary | ICD-10-CM

## 2017-06-27 DIAGNOSIS — Z5189 Encounter for other specified aftercare: Secondary | ICD-10-CM

## 2017-06-27 LAB — PSA: PROSTATE SPECIFIC AG, SERUM: 0.7 ng/mL (ref 0.0–4.0)

## 2017-06-27 MED ORDER — PEGFILGRASTIM INJECTION 6 MG/0.6ML ~~LOC~~
6.0000 mg | PREFILLED_SYRINGE | Freq: Once | SUBCUTANEOUS | Status: AC
  Administered 2017-06-27: 6 mg via SUBCUTANEOUS

## 2017-06-30 DIAGNOSIS — C61 Malignant neoplasm of prostate: Secondary | ICD-10-CM | POA: Diagnosis not present

## 2017-07-17 ENCOUNTER — Ambulatory Visit: Payer: Medicare Other

## 2017-07-17 ENCOUNTER — Ambulatory Visit (HOSPITAL_BASED_OUTPATIENT_CLINIC_OR_DEPARTMENT_OTHER): Payer: Medicare Other | Admitting: Oncology

## 2017-07-17 ENCOUNTER — Other Ambulatory Visit (HOSPITAL_BASED_OUTPATIENT_CLINIC_OR_DEPARTMENT_OTHER): Payer: Medicare Other

## 2017-07-17 ENCOUNTER — Ambulatory Visit (HOSPITAL_BASED_OUTPATIENT_CLINIC_OR_DEPARTMENT_OTHER): Payer: Medicare Other

## 2017-07-17 VITALS — BP 111/61 | HR 72 | Temp 98.6°F | Resp 17 | Ht 64.5 in | Wt 146.3 lb

## 2017-07-17 DIAGNOSIS — C7951 Secondary malignant neoplasm of bone: Secondary | ICD-10-CM

## 2017-07-17 DIAGNOSIS — C61 Malignant neoplasm of prostate: Secondary | ICD-10-CM

## 2017-07-17 DIAGNOSIS — Z5111 Encounter for antineoplastic chemotherapy: Secondary | ICD-10-CM

## 2017-07-17 LAB — CBC WITH DIFFERENTIAL/PLATELET
BASO%: 0.9 % (ref 0.0–2.0)
Basophils Absolute: 0.1 10*3/uL (ref 0.0–0.1)
EOS%: 0.9 % (ref 0.0–7.0)
Eosinophils Absolute: 0.1 10*3/uL (ref 0.0–0.5)
HEMATOCRIT: 32.7 % — AB (ref 38.4–49.9)
HEMOGLOBIN: 11 g/dL — AB (ref 13.0–17.1)
LYMPH#: 1.8 10*3/uL (ref 0.9–3.3)
LYMPH%: 18.7 % (ref 14.0–49.0)
MCH: 33.9 pg — ABNORMAL HIGH (ref 27.2–33.4)
MCHC: 33.7 g/dL (ref 32.0–36.0)
MCV: 100.3 fL — ABNORMAL HIGH (ref 79.3–98.0)
MONO#: 0.6 10*3/uL (ref 0.1–0.9)
MONO%: 6.3 % (ref 0.0–14.0)
NEUT#: 6.9 10*3/uL — ABNORMAL HIGH (ref 1.5–6.5)
NEUT%: 73.2 % (ref 39.0–75.0)
Platelets: 269 10*3/uL (ref 140–400)
RBC: 3.26 10*6/uL — ABNORMAL LOW (ref 4.20–5.82)
RDW: 16.1 % — AB (ref 11.0–14.6)
WBC: 9.4 10*3/uL (ref 4.0–10.3)

## 2017-07-17 LAB — COMPREHENSIVE METABOLIC PANEL
ALT: 14 U/L (ref 0–55)
AST: 22 U/L (ref 5–34)
Albumin: 3.2 g/dL — ABNORMAL LOW (ref 3.5–5.0)
Alkaline Phosphatase: 79 U/L (ref 40–150)
Anion Gap: 9 mEq/L (ref 3–11)
BUN: 12.6 mg/dL (ref 7.0–26.0)
CALCIUM: 9.2 mg/dL (ref 8.4–10.4)
CHLORIDE: 106 meq/L (ref 98–109)
CO2: 27 mEq/L (ref 22–29)
CREATININE: 1 mg/dL (ref 0.7–1.3)
EGFR: >90 mL/min/{1.73_m2} (ref >90)
GLUCOSE: 106 mg/dL (ref 70–140)
POTASSIUM: 3.5 meq/L (ref 3.5–5.1)
SODIUM: 141 meq/L (ref 136–145)
Total Bilirubin: 0.4 mg/dL (ref 0.20–1.20)
Total Protein: 7.2 g/dL (ref 6.4–8.3)

## 2017-07-17 MED ORDER — SODIUM CHLORIDE 0.9 % IV SOLN
Freq: Once | INTRAVENOUS | Status: AC
  Administered 2017-07-17: 09:00:00 via INTRAVENOUS

## 2017-07-17 MED ORDER — SODIUM CHLORIDE 0.9% FLUSH
10.0000 mL | INTRAVENOUS | Status: DC | PRN
  Administered 2017-07-17: 10 mL
  Filled 2017-07-17: qty 10

## 2017-07-17 MED ORDER — ONDANSETRON HCL 4 MG/2ML IJ SOLN
INTRAMUSCULAR | Status: AC
  Filled 2017-07-17: qty 4

## 2017-07-17 MED ORDER — DEXAMETHASONE SODIUM PHOSPHATE 10 MG/ML IJ SOLN
10.0000 mg | Freq: Once | INTRAMUSCULAR | Status: AC
  Administered 2017-07-17: 10 mg via INTRAVENOUS

## 2017-07-17 MED ORDER — ONDANSETRON HCL 4 MG/2ML IJ SOLN
8.0000 mg | Freq: Once | INTRAMUSCULAR | Status: AC
  Administered 2017-07-17: 8 mg via INTRAVENOUS

## 2017-07-17 MED ORDER — DOCETAXEL CHEMO INJECTION 160 MG/16ML
75.0000 mg/m2 | Freq: Once | INTRAVENOUS | Status: AC
  Administered 2017-07-17: 130 mg via INTRAVENOUS
  Filled 2017-07-17: qty 13

## 2017-07-17 MED ORDER — HEPARIN SOD (PORK) LOCK FLUSH 100 UNIT/ML IV SOLN
500.0000 [IU] | Freq: Once | INTRAVENOUS | Status: AC | PRN
  Administered 2017-07-17: 500 [IU]
  Filled 2017-07-17: qty 5

## 2017-07-17 MED ORDER — DEXAMETHASONE SODIUM PHOSPHATE 10 MG/ML IJ SOLN
INTRAMUSCULAR | Status: AC
  Filled 2017-07-17: qty 1

## 2017-07-17 NOTE — Progress Notes (Signed)
Hematology and Oncology Follow Up Visit  Daniel Howard [id number] July 05, 1956 61 y.o. 07/17/2017 8:57 AM [place], [name], [name], [name], MD   Principle Diagnosis: 61 year old gentleman with prostate cancer diagnosed in March 2018. He was found to have a PSA of 31.3 and a large pelvic mass measuring 7.7 x 7.2 cm. He has diffuse pelvic adenopathy and bony disease.   Prior Therapy: He is status post biopsy on 03/16/2017 of a pelvic mass which confirmed the presence of prostate cancer.  Current therapy:  Androgen deprivation therapy ongoing at [facility].  Taxotere chemotherapy at 75 mg/m started on 04/24/2017. He is here for cycle 5 of planned 6 cycles of therapy.  Interim History: Daniel Howard presents today for a follow-up visit. Since the last visit, he reports doing well without any recent complaints. He tolerated chemotherapy without complications. He denied any nausea, vomiting or peripheral neuropathy. His performance status activity level have been reasonably unchanged. He denied any peripheral neuropathy or infusion related complications. He denied any diarrhea or abdominal pain. He denied any hematochezia or melena.   He does not report any headaches, blurry vision, syncope or seizures. He does not report any fevers, chills, sweats or weight loss. He does not report any chest pain, palpitation, orthopnea or leg edema. He does not report any cough, wheezing or hemoptysis. He does not report any nausea, vomiting or abdominal pain. He does not report any frequency urgency or hesitancy. He is not report any skeletal complaints of arthralgias or myalgias. Remaining review of systems unremarkable  Medications: I have reviewed the patient's current medications.  Current Outpatient Prescriptions  Medication Sig Dispense Refill  . Adalimumab (HUMIRA PEN) 40 MG/0.8ML PNKT Inject 40 mg into the skin every 14 (fourteen) days. 2 each 6  . calcium carbonate (OS-CAL) 600 MG TABS tablet  Take 1,200 mg by mouth daily with breakfast.    . Ferrous Sulfate 28 MG TABS Take 1 tablet by mouth daily.    [name] HYDROcodone-acetaminophen (NORCO/VICODIN) 5-325 MG tablet Take 1 tablet by mouth every 6 (six) hours as needed for moderate pain. (Patient not taking: Reported on 06/26/2017) 60 tablet 0  . lidocaine-prilocaine (EMLA) cream Apply 1 application topically as needed. Apply to portacath prior to chemotherapy. 30 g 0  . mirtazapine (REMERON) 15 MG tablet TAKE 1 TABLET BY MOUTH AT BEDTIME 90 tablet 1  . Multiple Vitamins-Minerals (CENTRUM SILVER ADULT 50+) TABS Take 1 tablet by mouth daily.    . prochlorperazine (COMPAZINE) 10 MG tablet Take 1 tablet (10 mg total) by mouth every 6 (six) hours as needed for nausea or vomiting. 30 tablet 0  . sodium chloride (OCEAN) 0.65 % SOLN nasal spray Place 2 sprays into both nostrils 3 (three) times daily. 59 mL 1   No current facility-administered medications for this visit.      Allergies:  Allergies  Allergen Reactions  . Penicillins Hives    Past Medical History, Surgical history, Social history, and Family History were reviewed and updated.  Physical Exam: Blood pressure 111/61, pulse 72, temperature 98.6 F (37 C), temperature source Oral, resp. rate 17, height 5' 4.5" (1.638 m), weight 146 lb 4.8 oz (66.4 kg), SpO2 100 %. ECOG: 1 General appearance: Well-appearing gentleman without distress. Head: Normocephalic, without obvious abnormality no oral thrush or ulcers. Neck: no adenopathy Lymph nodes: Cervical, supraclavicular, and axillary nodes normal. Heart:regular rate and rhythm, S1, S2 normal, no murmur, click, rub or gallop Lung:chest clear, no wheezing, rales, normal symmetric air entry  Abdomin: soft, non-tender, without masses or organomegaly. No rebound or guarding. No shifting dullness or ascites. Good bowel sounds auscultated. EXT:no erythema, induration, or nodules   Lab Results: Lab Results  Component Value Date   WBC 9.4  07/17/2017   HGB 11.0 (L) 07/17/2017   HCT 32.7 (L) 07/17/2017   MCV 100.3 (H) 07/17/2017   PLT 269 07/17/2017     Chemistry      Component Value Date/Time   NA 140 06/26/2017 0857   K 3.4 (L) 06/26/2017 0857   CL 106 03/16/2017 0732   CO2 24 06/26/2017 0857   BUN 17.1 06/26/2017 0857   CREATININE 1.2 06/26/2017 0857      Component Value Date/Time   CALCIUM 9.2 06/26/2017 0857   ALKPHOS 78 06/26/2017 0857   AST 16 06/26/2017 0857   ALT 12 06/26/2017 0857   BILITOT 0.58 06/26/2017 0857       [name], [name] as of 07/17/2017 08:52  Ref. Range 05/15/2017 07:41 06/05/2017 08:15 06/26/2017 08:57  Prostate Specific Ag, Serum Latest Ref Range: 0.0 - 4.0 ng/mL 0.9 0.8 0.7    Impression and Plan:   61 year old gentleman with the following issues:  1. Prostate cancer diagnosed in March 2018. He presented with advanced disease including pelvic mass, diffuse adenopathy as well as diffuse bony disease. He underwent a biopsy of a 7.7 x 7.2 mass of the right hemipelvis which confirmed the presence of prostate cancer. His PSA is 31.3. He has already been started on androgen deprivation therapy.  He is currently receiving Taxotere chemotherapy at 75 mg/m every 3 weeks for a total of 6 cycles. He received the last treatment without complications.  The plan is to proceed with cycle 5 without any dose reduction or delay. Cycle 6 of therapy will be given in 3 weeks. After that he will be on observation and surveillance.   2. IV access: Port-A-Cath inserted without complications. This will be utilized for the duration of chemotherapy. Will be flushed every 6-8 weeks after that.  3. Neutropenia prophylaxis: He'll receive Neulasta after each chemotherapy.  4. Antiemetics: Prescription for antiemetics including Compazine is available to the patient. No issues reported at this time.  5. Androgen deprivation therapy: This will continue indefinitely. He is currently  receiving that at [facility] urology.  6. Follow-up: Will be in 3 weeks for next cycle of chemotherapy.     [facility], MD 7/27/20188:57 AM

## 2017-07-17 NOTE — Patient Instructions (Signed)
[facility] Discharge Instructions for Patients Receiving Chemotherapy  Today you received the following chemotherapy agents:  Taxotere.  To help prevent nausea and vomiting after your treatment, we encourage you to take your nausea medication as directed.   If you develop nausea and vomiting that is not controlled by your nausea medication, call the clinic.   BELOW ARE SYMPTOMS THAT SHOULD BE REPORTED IMMEDIATELY:  *FEVER GREATER THAN 100.5 F  *CHILLS WITH OR WITHOUT FEVER  NAUSEA AND VOMITING THAT IS NOT CONTROLLED WITH YOUR NAUSEA MEDICATION  *UNUSUAL SHORTNESS OF BREATH  *UNUSUAL BRUISING OR BLEEDING  TENDERNESS IN MOUTH AND THROAT WITH OR WITHOUT PRESENCE OF ULCERS  *URINARY PROBLEMS  *BOWEL PROBLEMS  UNUSUAL RASH Items with * indicate a potential emergency and should be followed up as soon as possible.  Feel free to call the clinic you have any questions or concerns. The clinic phone number is [phone].  Please show the [place] at check-in to the Emergency Department and triage nurse.

## 2017-07-18 ENCOUNTER — Ambulatory Visit (HOSPITAL_BASED_OUTPATIENT_CLINIC_OR_DEPARTMENT_OTHER): Payer: Medicare Other

## 2017-07-18 VITALS — BP 120/73 | HR 91 | Temp 97.7°F | Resp 20

## 2017-07-18 DIAGNOSIS — C61 Malignant neoplasm of prostate: Secondary | ICD-10-CM | POA: Diagnosis present

## 2017-07-18 DIAGNOSIS — Z5189 Encounter for other specified aftercare: Secondary | ICD-10-CM | POA: Diagnosis not present

## 2017-07-18 LAB — PSA: Prostate Specific Ag, Serum: 0.5 ng/mL (ref 0.0–4.0)

## 2017-07-18 MED ORDER — PEGFILGRASTIM INJECTION 6 MG/0.6ML ~~LOC~~
6.0000 mg | PREFILLED_SYRINGE | Freq: Once | SUBCUTANEOUS | Status: AC
  Administered 2017-07-18: 6 mg via SUBCUTANEOUS

## 2017-08-07 ENCOUNTER — Other Ambulatory Visit (HOSPITAL_BASED_OUTPATIENT_CLINIC_OR_DEPARTMENT_OTHER): Payer: Medicare Other

## 2017-08-07 ENCOUNTER — Ambulatory Visit: Payer: Medicare Other

## 2017-08-07 ENCOUNTER — Ambulatory Visit (HOSPITAL_BASED_OUTPATIENT_CLINIC_OR_DEPARTMENT_OTHER): Payer: Medicare Other

## 2017-08-07 ENCOUNTER — Ambulatory Visit (HOSPITAL_BASED_OUTPATIENT_CLINIC_OR_DEPARTMENT_OTHER): Payer: Medicare Other | Admitting: Oncology

## 2017-08-07 ENCOUNTER — Telehealth: Payer: Self-pay | Admitting: Oncology

## 2017-08-07 VITALS — BP 107/67 | HR 82 | Temp 98.7°F | Resp 17 | Ht 64.5 in | Wt 144.9 lb

## 2017-08-07 DIAGNOSIS — Z5111 Encounter for antineoplastic chemotherapy: Secondary | ICD-10-CM | POA: Diagnosis not present

## 2017-08-07 DIAGNOSIS — C7951 Secondary malignant neoplasm of bone: Secondary | ICD-10-CM

## 2017-08-07 DIAGNOSIS — C61 Malignant neoplasm of prostate: Secondary | ICD-10-CM | POA: Diagnosis not present

## 2017-08-07 LAB — CBC WITH DIFFERENTIAL/PLATELET
BASO%: 1 % (ref 0.0–2.0)
Basophils Absolute: 0.1 10*3/uL (ref 0.0–0.1)
EOS%: 1.1 % (ref 0.0–7.0)
Eosinophils Absolute: 0.1 10*3/uL (ref 0.0–0.5)
HEMATOCRIT: 24.6 % — AB (ref 38.4–49.9)
HGB: 8.3 g/dL — ABNORMAL LOW (ref 13.0–17.1)
LYMPH#: 1.6 10*3/uL (ref 0.9–3.3)
LYMPH%: 18.3 % (ref 14.0–49.0)
MCH: 34.4 pg — ABNORMAL HIGH (ref 27.2–33.4)
MCHC: 33.8 g/dL (ref 32.0–36.0)
MCV: 101.7 fL — ABNORMAL HIGH (ref 79.3–98.0)
MONO#: 0.7 10*3/uL (ref 0.1–0.9)
MONO%: 8.5 % (ref 0.0–14.0)
NEUT%: 71.1 % (ref 39.0–75.0)
NEUTROS ABS: 6.2 10*3/uL (ref 1.5–6.5)
PLATELETS: 311 10*3/uL (ref 140–400)
RBC: 2.42 10*6/uL — AB (ref 4.20–5.82)
RDW: 16.7 % — ABNORMAL HIGH (ref 11.0–14.6)
WBC: 8.7 10*3/uL (ref 4.0–10.3)

## 2017-08-07 LAB — COMPREHENSIVE METABOLIC PANEL
ALT: 10 U/L (ref 0–55)
ANION GAP: 7 meq/L (ref 3–11)
AST: 18 U/L (ref 5–34)
Albumin: 2.8 g/dL — ABNORMAL LOW (ref 3.5–5.0)
Alkaline Phosphatase: 66 U/L (ref 40–150)
BILIRUBIN TOTAL: 0.35 mg/dL (ref 0.20–1.20)
BUN: 11.4 mg/dL (ref 7.0–26.0)
CALCIUM: 9 mg/dL (ref 8.4–10.4)
CHLORIDE: 108 meq/L (ref 98–109)
CO2: 25 meq/L (ref 22–29)
CREATININE: 1.1 mg/dL (ref 0.7–1.3)
EGFR: 81 mL/min/{1.73_m2} — ABNORMAL LOW (ref >90)
Glucose: 100 mg/dl (ref 70–140)
Potassium: 3.9 mEq/L (ref 3.5–5.1)
Sodium: 139 mEq/L (ref 136–145)
TOTAL PROTEIN: 6.3 g/dL — AB (ref 6.4–8.3)

## 2017-08-07 MED ORDER — HEPARIN SOD (PORK) LOCK FLUSH 100 UNIT/ML IV SOLN
500.0000 [IU] | Freq: Once | INTRAVENOUS | Status: AC | PRN
  Administered 2017-08-07: 500 [IU]
  Filled 2017-08-07: qty 5

## 2017-08-07 MED ORDER — ONDANSETRON HCL 4 MG/2ML IJ SOLN
8.0000 mg | Freq: Once | INTRAMUSCULAR | Status: AC
  Administered 2017-08-07: 8 mg via INTRAVENOUS

## 2017-08-07 MED ORDER — DEXAMETHASONE SODIUM PHOSPHATE 10 MG/ML IJ SOLN
INTRAMUSCULAR | Status: AC
  Filled 2017-08-07: qty 1

## 2017-08-07 MED ORDER — SODIUM CHLORIDE 0.9 % IV SOLN
Freq: Once | INTRAVENOUS | Status: AC
  Administered 2017-08-07: 10:00:00 via INTRAVENOUS

## 2017-08-07 MED ORDER — DEXTROSE 5 % IV SOLN
75.0000 mg/m2 | Freq: Once | INTRAVENOUS | Status: AC
  Administered 2017-08-07: 130 mg via INTRAVENOUS
  Filled 2017-08-07: qty 13

## 2017-08-07 MED ORDER — ONDANSETRON HCL 4 MG/2ML IJ SOLN
INTRAMUSCULAR | Status: AC
  Filled 2017-08-07: qty 4

## 2017-08-07 MED ORDER — SODIUM CHLORIDE 0.9% FLUSH
10.0000 mL | INTRAVENOUS | Status: DC | PRN
  Administered 2017-08-07: 10 mL
  Filled 2017-08-07: qty 10

## 2017-08-07 MED ORDER — SODIUM CHLORIDE 0.9 % IJ SOLN
10.0000 mL | Freq: Once | INTRAMUSCULAR | Status: AC
  Administered 2017-08-07: 10 mL via INTRAVENOUS
  Filled 2017-08-07: qty 10

## 2017-08-07 MED ORDER — DEXAMETHASONE SODIUM PHOSPHATE 10 MG/ML IJ SOLN
10.0000 mg | Freq: Once | INTRAMUSCULAR | Status: AC
  Administered 2017-08-07: 10 mg via INTRAVENOUS

## 2017-08-07 NOTE — Telephone Encounter (Signed)
Scheduled appt per 8/17 los - patient to get new schedule in the treatment area - reminder letter in the mail.

## 2017-08-07 NOTE — Patient Instructions (Signed)

## 2017-08-07 NOTE — Patient Instructions (Signed)
[facility] Discharge Instructions for Patients Receiving Chemotherapy  Today you received the following chemotherapy agents Taxotere  To help prevent nausea and vomiting after your treatment, we encourage you to take your nausea medication As directed If you develop nausea and vomiting that is not controlled by your nausea medication, call the clinic.   BELOW ARE SYMPTOMS THAT SHOULD BE REPORTED IMMEDIATELY:  *FEVER GREATER THAN 100.5 F  *CHILLS WITH OR WITHOUT FEVER  NAUSEA AND VOMITING THAT IS NOT CONTROLLED WITH YOUR NAUSEA MEDICATION  *UNUSUAL SHORTNESS OF BREATH  *UNUSUAL BRUISING OR BLEEDING  TENDERNESS IN MOUTH AND THROAT WITH OR WITHOUT PRESENCE OF ULCERS  *URINARY PROBLEMS  *BOWEL PROBLEMS  UNUSUAL RASH Items with * indicate a potential emergency and should be followed up as soon as possible.  Feel free to call the clinic you have any questions or concerns. The clinic phone number is [phone].  Please show the [place] at check-in to the Emergency Department and triage nurse.

## 2017-08-07 NOTE — Progress Notes (Signed)
Hematology and Oncology Follow Up Visit  Daniel Howard [id number] February 13, 1956 61 y.o. 08/07/2017 9:36 AM [place], [name], [name], [name], MD   Principle Diagnosis: 61 year old gentleman with prostate cancer diagnosed in March 2018. He was found to have a PSA of 31.3 and a large pelvic mass measuring 7.7 x 7.2 cm. He has diffuse pelvic adenopathy and bony disease.   Prior Therapy: He is status post biopsy on 03/16/2017 of a pelvic mass which confirmed the presence of prostate cancer.  Current therapy:  Androgen deprivation therapy ongoing at [facility].  Taxotere chemotherapy at 75 mg/m started on 04/24/2017. He is here for cycle 6 of therapy.  Interim History: Daniel Howard presents today for a follow-up visit. Since the last visit, he reports no complications related to chemotherapy. He denied any nausea, vomiting or peripheral neuropathy. His performance status activity level have been reasonably unchanged. He denied any peripheral neuropathy or infusion related complications. He denied any diarrhea or abdominal pain. He denied any hematochezia or melena. He continues to drive and attends activities of daily living. He denied dyspnea and exertion or excessive fatigue.   He does not report any headaches, blurry vision, syncope or seizures. He does not report any fevers, chills, sweats or weight loss. He does not report any chest pain, palpitation, orthopnea or leg edema. He does not report any cough, wheezing or hemoptysis. He does not report any nausea, vomiting or abdominal pain. He does not report any frequency urgency or hesitancy. He is not report any skeletal complaints of arthralgias or myalgias. Remaining review of systems unremarkable  Medications: I have reviewed the patient's current medications.  Current Outpatient Prescriptions  Medication Sig Dispense Refill  . Adalimumab (HUMIRA PEN) 40 MG/0.8ML PNKT Inject 40 mg into the skin every 14 (fourteen) days. 2 each 6  .  calcium carbonate (OS-CAL) 600 MG TABS tablet Take 1,200 mg by mouth daily with breakfast.    . Ferrous Sulfate 28 MG TABS Take 1 tablet by mouth daily.    [name] HYDROcodone-acetaminophen (NORCO/VICODIN) 5-325 MG tablet Take 1 tablet by mouth every 6 (six) hours as needed for moderate pain. (Patient not taking: Reported on 06/26/2017) 60 tablet 0  . lidocaine-prilocaine (EMLA) cream Apply 1 application topically as needed. Apply to portacath prior to chemotherapy. 30 g 0  . mirtazapine (REMERON) 15 MG tablet TAKE 1 TABLET BY MOUTH AT BEDTIME 90 tablet 1  . Multiple Vitamins-Minerals (CENTRUM SILVER ADULT 50+) TABS Take 1 tablet by mouth daily.    . prochlorperazine (COMPAZINE) 10 MG tablet Take 1 tablet (10 mg total) by mouth every 6 (six) hours as needed for nausea or vomiting. 30 tablet 0  . sodium chloride (OCEAN) 0.65 % SOLN nasal spray Place 2 sprays into both nostrils 3 (three) times daily. 59 mL 1   No current facility-administered medications for this visit.      Allergies:  Allergies  Allergen Reactions  . Penicillins Hives    Past Medical History, Surgical history, Social history, and Family History were reviewed and updated.  Physical Exam: Blood pressure 107/67, pulse 82, temperature 98.7 F (37.1 C), temperature source Oral, resp. rate 17, height 5' 4.5" (1.638 m), weight 144 lb 14.4 oz (65.7 kg), SpO2 100 %. ECOG: 1 General appearance: Alert, awake gentleman without distress. Head: Normocephalic, without obvious abnormality no oral thrush or ulcers Neck: no adenopathy Lymph nodes: Cervical, supraclavicular, and axillary nodes normal. Heart:regular rate and rhythm, S1, S2 normal, no murmur, click, rub or gallop  Lung:chest clear, no wheezing, rales, normal symmetric air entry Abdomin: soft, non-tender, without masses or organomegaly. EXT:no erythema, induration, or nodules   Lab Results: Lab Results  Component Value Date   WBC 8.7 08/07/2017   HGB 8.3 (L) 08/07/2017    HCT 24.6 (L) 08/07/2017   MCV 101.7 (H) 08/07/2017   PLT 311 08/07/2017     Chemistry      Component Value Date/Time   NA 141 07/17/2017 0831   K 3.5 07/17/2017 0831   CL 106 03/16/2017 0732   CO2 27 07/17/2017 0831   BUN 12.6 07/17/2017 0831   CREATININE 1.0 07/17/2017 0831      Component Value Date/Time   CALCIUM 9.2 07/17/2017 0831   ALKPHOS 79 07/17/2017 0831   AST 22 07/17/2017 0831   ALT 14 07/17/2017 0831   BILITOT 0.40 07/17/2017 0831       [name], Daniel Howard (MRN [id number]) as of 08/07/2017 09:29  Ref. Range 06/05/2017 08:15 06/26/2017 08:57 07/17/2017 08:31  Prostate Specific Ag, Serum Latest Ref Range: 0.0 - 4.0 ng/mL 0.8 0.7 0.5     Impression and Plan:   61 year old gentleman with the following issues:  1. Prostate cancer diagnosed in March 2018. He presented with advanced disease including pelvic mass, diffuse adenopathy as well as diffuse bony disease. He underwent a biopsy of a 7.7 x 7.2 mass of the right hemipelvis which confirmed the presence of prostate cancer. His PSA is 31.3. He has already been started on androgen deprivation therapy.  He is currently receiving Taxotere chemotherapy at 75 mg/m every 3 weeks for a total of 6 cycles. He received the last treatment without complications.  The plan is to proceed with cycle 6 without any dose reduction or delay. Upon completing cycle 6 he will be on active surveillance at this time and monitor his PSA. Second line therapy will be given if his PSA starts to rise again.   2. IV access: Port-A-Cath inserted without complications. This will be utilized for the duration of chemotherapy. Will be flushed every 6-8 weeks after that.  3. Neutropenia prophylaxis: He'll receive Neulasta after each chemotherapy.  4. Antiemetics: Prescription for antiemetics including Compazine is available to him. He is not experiencing any nausea or vomiting.  5. Androgen deprivation therapy: This will continue  indefinitely. He is currently receiving that at [facility] urology.  6. Follow-up: Will be in 6 weeks to follow his progress.     [facility], MD 8/17/20189:36 AM

## 2017-08-08 ENCOUNTER — Ambulatory Visit (HOSPITAL_BASED_OUTPATIENT_CLINIC_OR_DEPARTMENT_OTHER): Payer: Medicare Other

## 2017-08-08 VITALS — BP 115/81 | HR 78 | Temp 98.0°F | Resp 18

## 2017-08-08 DIAGNOSIS — C61 Malignant neoplasm of prostate: Secondary | ICD-10-CM

## 2017-08-08 DIAGNOSIS — Z5189 Encounter for other specified aftercare: Secondary | ICD-10-CM

## 2017-08-08 LAB — PSA: PROSTATE SPECIFIC AG, SERUM: 0.4 ng/mL (ref 0.0–4.0)

## 2017-08-08 MED ORDER — PEGFILGRASTIM INJECTION 6 MG/0.6ML ~~LOC~~
6.0000 mg | PREFILLED_SYRINGE | Freq: Once | SUBCUTANEOUS | Status: AC
  Administered 2017-08-08: 6 mg via SUBCUTANEOUS

## 2017-08-25 ENCOUNTER — Other Ambulatory Visit: Payer: Self-pay | Admitting: Family Medicine

## 2017-08-25 NOTE — Telephone Encounter (Signed)
Pt needs refill on hydrocodone.

## 2017-08-26 MED ORDER — HYDROCODONE-ACETAMINOPHEN 5-325 MG PO TABS: 1.0000 | ORAL_TABLET | Freq: Four times a day (QID) | ORAL | 0 refills | Status: DC | PRN

## 2017-08-26 NOTE — Telephone Encounter (Signed)
Provider approved/ printed patient is in office to pick up

## 2017-08-26 NOTE — Telephone Encounter (Signed)
Last OV 03/04/2017 Last refill 06/23/2017 #60 0 refills Okay to refill?

## 2017-08-26 NOTE — Telephone Encounter (Signed)
Agree with above 

## 2017-09-11 ENCOUNTER — Ambulatory Visit (INDEPENDENT_AMBULATORY_CARE_PROVIDER_SITE_OTHER): Payer: Medicare Other | Admitting: Family Medicine

## 2017-09-11 ENCOUNTER — Encounter: Payer: Self-pay | Admitting: Family Medicine

## 2017-09-11 VITALS — BP 110/72 | HR 64 | Temp 98.8°F | Resp 14 | Ht 64.5 in | Wt 142.0 lb

## 2017-09-11 DIAGNOSIS — L02211 Cutaneous abscess of abdominal wall: Secondary | ICD-10-CM

## 2017-09-11 DIAGNOSIS — E43 Unspecified severe protein-calorie malnutrition: Secondary | ICD-10-CM

## 2017-09-11 DIAGNOSIS — Z23 Encounter for immunization: Secondary | ICD-10-CM | POA: Diagnosis not present

## 2017-09-11 MED ORDER — MIRTAZAPINE 15 MG PO TABS: 15.0000 mg | ORAL_TABLET | Freq: Every day | ORAL | 1 refills | Status: DC

## 2017-09-11 MED ORDER — HYDROCODONE-ACETAMINOPHEN 5-325 MG PO TABS: 1.0000 | ORAL_TABLET | Freq: Four times a day (QID) | ORAL | 0 refills | Status: DC | PRN

## 2017-09-11 MED ORDER — SULFAMETHOXAZOLE-TRIMETHOPRIM 800-160 MG PO TABS: 1.0000 | ORAL_TABLET | Freq: Two times a day (BID) | ORAL | 0 refills | Status: DC

## 2017-09-11 NOTE — Assessment & Plan Note (Signed)
His weight is much improved and has been steady him and have him decrease the Remeron to 7.5 mg

## 2017-09-11 NOTE — Patient Instructions (Addendum)
F/U 2 months for physical  Go down to 1/2 tablet on remeron  Take antibiotics

## 2017-09-11 NOTE — Progress Notes (Signed)
   Subjective:    Patient ID: Daniel Howard, male    DOB: [birth year], 61 y.o.   MRN: [id number]  Patient presents for Abdominal Abscess (has blister like area to incision under navel, would like eval for possible infection)  Patient with abdominal pain use history of Crohn's disease of the small and large intestines with colostomy He states that he is cancer free from prostate cancer, has finished all chemo treatments    Past couple weeks though he has noted an enlarging skin abscess on his incision midline where he had his initial colon surgery done greater than 10 years ago. It is not tender there as been no drainage. He also noticed that there is a stitch at the surface He has not  had any fever or chills  Review Of Systems:  GEN- denies fatigue, fever, weight loss,weakness, recent illness HEENT- denies eye drainage, change in vision, nasal discharge, CVS- denies chest pain, palpitations RESP- denies SOB, cough, wheeze ABD- denies N/V, change in stools, abd pain GU- denies dysuria, hematuria, dribbling, incontinence MSK- denies joint pain, muscle aches, injury Neuro- denies headache, dizziness, syncope, seizure activity       Objective:    BP 110/72   Pulse 64   Temp 98.8 F (37.1 C) (Oral)   Resp 14   Ht 5' 4.5" (1.638 m)   Wt 142 lb (64.4 kg)   SpO2 100%   BMI 24.00 kg/m  GEN- NAD, alert and oriented x3 CVS- RRR, no murmur RESP-CTAB ABD-NABS,soft,NT, ND, colostomy in tact, midline incision blue stitch seen, small pustule beneath stitch Pulses- Radial 2+   Procedure- Incision and Drainage Procedure explained to patient questions answered benefits and risks discussed written consent obtained. Antiseptic-Betadine Anesthesia-Pt numb in area of pustule Very small Incision performed small amount of  Clear/bloody fluid expressed Culture taken Minimal blood loss Patient tolerated procedure well Bandage applied      Assessment & Plan:     Pt given script for  October for pain medication  Problem List Items Addressed This Visit      Unprioritized   Protein-calorie malnutrition, severe (Daniel Howard)    His weight is much improved and has been steady him and have him decrease the Remeron to 7.5 mg       Other Visit Diagnoses    Cutaneous abscess of abdominal wall    -  Primary   More cystic fluid from the pustular lesion, culture sent, start bactrim as near incision, had old stich I also cut out. Colostomy looks okay.   Relevant Orders   WOUND CULTURE   Need for immunization against influenza       Relevant Orders   Flu Vaccine QUAD 36+ mos IM (Completed)      Note: This dictation was prepared with Dragon dictation along with smaller phrase technology. Any transcriptional errors that result from this process are unintentional.

## 2017-09-15 LAB — WOUND CULTURE
MICRO NUMBER: 81047470
SPECIMEN QUALITY:: ADEQUATE

## 2017-09-16 ENCOUNTER — Telehealth: Payer: Self-pay | Admitting: Family Medicine

## 2017-09-16 NOTE — Telephone Encounter (Signed)
Pt returning call about results, please call him back.

## 2017-09-16 NOTE — Telephone Encounter (Signed)
Please see labs for further documentation.

## 2017-09-21 ENCOUNTER — Other Ambulatory Visit (HOSPITAL_BASED_OUTPATIENT_CLINIC_OR_DEPARTMENT_OTHER): Payer: Medicare Other

## 2017-09-21 ENCOUNTER — Ambulatory Visit (HOSPITAL_BASED_OUTPATIENT_CLINIC_OR_DEPARTMENT_OTHER): Payer: Medicare Other

## 2017-09-21 DIAGNOSIS — C61 Malignant neoplasm of prostate: Secondary | ICD-10-CM | POA: Diagnosis not present

## 2017-09-21 DIAGNOSIS — Z452 Encounter for adjustment and management of vascular access device: Secondary | ICD-10-CM

## 2017-09-21 LAB — CBC WITH DIFFERENTIAL/PLATELET
BASO%: 2.1 % — AB (ref 0.0–2.0)
Basophils Absolute: 0.1 10*3/uL (ref 0.0–0.1)
EOS%: 7 % (ref 0.0–7.0)
Eosinophils Absolute: 0.4 10*3/uL (ref 0.0–0.5)
HEMATOCRIT: 33.3 % — AB (ref 38.4–49.9)
HGB: 11.2 g/dL — ABNORMAL LOW (ref 13.0–17.1)
LYMPH#: 2 10*3/uL (ref 0.9–3.3)
LYMPH%: 33.2 % (ref 14.0–49.0)
MCH: 31.3 pg (ref 27.2–33.4)
MCHC: 33.6 g/dL (ref 32.0–36.0)
MCV: 93.3 fL (ref 79.3–98.0)
MONO#: 0.4 10*3/uL (ref 0.1–0.9)
MONO%: 5.9 % (ref 0.0–14.0)
NEUT%: 51.8 % (ref 39.0–75.0)
NEUTROS ABS: 3.2 10*3/uL (ref 1.5–6.5)
PLATELETS: 333 10*3/uL (ref 140–400)
RBC: 3.56 10*6/uL — ABNORMAL LOW (ref 4.20–5.82)
RDW: 15.3 % — AB (ref 11.0–14.6)
WBC: 6.1 10*3/uL (ref 4.0–10.3)

## 2017-09-21 LAB — COMPREHENSIVE METABOLIC PANEL
ALT: 9 U/L (ref 0–55)
ANION GAP: 9 meq/L (ref 3–11)
AST: 20 U/L (ref 5–34)
Albumin: 3.3 g/dL — ABNORMAL LOW (ref 3.5–5.0)
Alkaline Phosphatase: 81 U/L (ref 40–150)
BILIRUBIN TOTAL: 0.59 mg/dL (ref 0.20–1.20)
BUN: 15.1 mg/dL (ref 7.0–26.0)
CALCIUM: 9.7 mg/dL (ref 8.4–10.4)
CO2: 26 meq/L (ref 22–29)
CREATININE: 1.3 mg/dL (ref 0.7–1.3)
Chloride: 101 mEq/L (ref 98–109)
EGFR: 72 mL/min/{1.73_m2} — ABNORMAL LOW (ref >90)
Glucose: 86 mg/dl (ref 70–140)
Potassium: 4.1 mEq/L (ref 3.5–5.1)
Sodium: 136 mEq/L (ref 136–145)
TOTAL PROTEIN: 7.9 g/dL (ref 6.4–8.3)

## 2017-09-21 MED ORDER — HEPARIN SOD (PORK) LOCK FLUSH 100 UNIT/ML IV SOLN
500.0000 [IU] | Freq: Once | INTRAVENOUS | Status: AC | PRN
  Administered 2017-09-21: 500 [IU]
  Filled 2017-09-21: qty 5

## 2017-09-21 MED ORDER — SODIUM CHLORIDE 0.9% FLUSH
10.0000 mL | INTRAVENOUS | Status: DC | PRN
  Administered 2017-09-21: 10 mL
  Filled 2017-09-21: qty 10

## 2017-09-22 ENCOUNTER — Ambulatory Visit (HOSPITAL_COMMUNITY)
Admission: RE | Admit: 2017-09-22 | Discharge: 2017-09-22 | Disposition: A | Discharge status: Home / Self Care | Payer: Medicare Other | Source: Ambulatory Visit | Attending: Gastroenterology | Admitting: Gastroenterology

## 2017-09-22 ENCOUNTER — Encounter: Payer: Self-pay | Admitting: Gastroenterology

## 2017-09-22 ENCOUNTER — Other Ambulatory Visit: Payer: Self-pay

## 2017-09-22 ENCOUNTER — Ambulatory Visit (INDEPENDENT_AMBULATORY_CARE_PROVIDER_SITE_OTHER): Payer: Medicare Other | Admitting: Gastroenterology

## 2017-09-22 VITALS — BP 113/73 | HR 88 | Temp 97.1°F | Ht 64.5 in | Wt 136.8 lb

## 2017-09-22 DIAGNOSIS — R109 Unspecified abdominal pain: Secondary | ICD-10-CM

## 2017-09-22 DIAGNOSIS — R935 Abnormal findings on diagnostic imaging of other abdominal regions, including retroperitoneum: Secondary | ICD-10-CM | POA: Diagnosis not present

## 2017-09-22 DIAGNOSIS — C7951 Secondary malignant neoplasm of bone: Secondary | ICD-10-CM | POA: Insufficient documentation

## 2017-09-22 DIAGNOSIS — C61 Malignant neoplasm of prostate: Secondary | ICD-10-CM

## 2017-09-22 DIAGNOSIS — K50819 Crohn's disease of both small and large intestine with unspecified complications: Secondary | ICD-10-CM | POA: Diagnosis not present

## 2017-09-22 DIAGNOSIS — K6389 Other specified diseases of intestine: Secondary | ICD-10-CM | POA: Diagnosis not present

## 2017-09-22 DIAGNOSIS — R1033 Periumbilical pain: Secondary | ICD-10-CM | POA: Diagnosis not present

## 2017-09-22 DIAGNOSIS — K439 Ventral hernia without obstruction or gangrene: Secondary | ICD-10-CM | POA: Diagnosis not present

## 2017-09-22 DIAGNOSIS — R1900 Intra-abdominal and pelvic swelling, mass and lump, unspecified site: Secondary | ICD-10-CM | POA: Insufficient documentation

## 2017-09-22 DIAGNOSIS — R599 Enlarged lymph nodes, unspecified: Secondary | ICD-10-CM | POA: Diagnosis not present

## 2017-09-22 DIAGNOSIS — K432 Incisional hernia without obstruction or gangrene: Secondary | ICD-10-CM | POA: Insufficient documentation

## 2017-09-22 LAB — PSA: Prostate Specific Ag, Serum: 0.3 ng/mL (ref 0.0–4.0)

## 2017-09-22 MED ORDER — IOPAMIDOL (ISOVUE-300) INJECTION 61%
INTRAVENOUS | Status: AC
  Administered 2017-09-22: 30 mL
  Filled 2017-09-22: qty 30

## 2017-09-22 MED ORDER — IOPAMIDOL (ISOVUE-300) INJECTION 61%
100.0000 mL | Freq: Once | INTRAVENOUS | Status: AC | PRN
  Administered 2017-09-22: 100 mL via INTRAVENOUS

## 2017-09-22 NOTE — Progress Notes (Signed)
Primary Care Physician: Alycia Rossetti, MD  Primary Gastroenterologist:  Garfield Cornea, MD   Chief Complaint  Patient presents with  . Abdominal Pain    HPI: Daniel Howard is a 61 y.o. male here for further evaluation of abd pain. He has h/o complicated Crohn's disease, clinically has being doing well on Humira for the past year. He has had multiple prior abd surgeries including right hemicolectomy with ileostomy. He has sequestered left colon that cannot be adequate surveilled via scope due to rectal stricture. Plans for periodic MRE's. Patient has h/o large left-sided spigelian hernia.   Admitted with small bowel obstruction last fall. Imaging reveal multiple abd wall hernias, none felt to be responsible for obstruction process. Also with suspicious inflammatory/possibly neoplastic mass in rectum/prostate area. Subsequently has been confirmed to be metastatic prostate cancer with bony metastases.   Weight fluctuates quite of bit and essentially stable over the past 3 months but was about 20 pounds heavier in 01/2017. Unchanged from 09/2016 however (131 lb). Patient recently completed cycle 6 of taxotere. Androgen deprivation therapy given by urologist. Palliative approach with plans for active surveillance and monitoring PSA, second line therapy to be given if PSA starts to rise again.   Patient has appt with oncology tomorrow.   For past few weeks he has noted increased abd pain, worse the last 10 days. Initially few weeks back, he had decreased ostomy output but this seemed to resolved on its own. Now with colicky abd pain every 20-30 minutes throughout day and night with some nausea but no vomiting. No bloody stool. No fever. Pain is to left of his ostomy. No dysuria or urinary issues.   Last CT a/p with contrast 01/2017: moderate inflammation in distal and TI c/w active crohn's. No abscess or fistula. Enlarged pericecal lymph nodes. Large spegelian hernia left side containing  loops of bowel, small bowel and descending colon. large irregular pelvic mass at prostate/rectum. Pelvic lymphadenopathy. Small stable right-sided abd wall hernia, right inguinal hernia.   Current Outpatient Prescriptions  Medication Sig Dispense Refill  . Adalimumab (HUMIRA PEN) 40 MG/0.8ML PNKT Inject 40 mg into the skin every 14 (fourteen) days. 2 each 6  . calcium carbonate (OS-CAL) 600 MG TABS tablet Take 1,200 mg by mouth daily with breakfast.    . Ferrous Sulfate 28 MG TABS Take 1 tablet by mouth daily.    [name] HYDROcodone-acetaminophen (NORCO/VICODIN) 5-325 MG tablet Take 1 tablet by mouth every 6 (six) hours as needed for moderate pain. 60 tablet 0  . mirtazapine (REMERON) 15 MG tablet Take 1 tablet (15 mg total) by mouth at bedtime. 90 tablet 1  . Multiple Vitamins-Minerals (CENTRUM SILVER ADULT 50+) TABS Take 1 tablet by mouth daily.    [name] sulfamethoxazole-trimethoprim (BACTRIM DS,SEPTRA DS) 800-160 MG tablet Take 1 tablet by mouth 2 (two) times daily. (Patient not taking: Reported on 09/22/2017) 10 tablet 0   No current facility-administered medications for this visit.     Allergies as of 09/22/2017 - Review Complete 09/22/2017  Allergen Reaction Noted  . Penicillins Hives 01/25/2007    ROS:  General: Negative for   fever, chills, fatigue, weakness. See hpi ENT: Negative for hoarseness, difficulty swallowing , nasal congestion. CV: Negative for chest pain, angina, palpitations, dyspnea on exertion, peripheral edema.  Respiratory: Negative for dyspnea at rest, dyspnea on exertion, cough, sputum, wheezing.  GI: See history of present illness. GU:  Negative for dysuria, hematuria, urinary incontinence, urinary frequency, nocturnal urination.  Endo: Negative for unusual weight change.    Physical Examination:   BP 113/73   Pulse 88   Temp (!) 97.1 F (36.2 C) (Oral)   Ht 5' 4.5" (1.638 m)   Wt 136 lb 12.8 oz (62.1 kg)   BMI 23.12 kg/m   General: Well-nourished,  well-developed in no acute distress.  Eyes: No icterus. Mouth: Oropharyngeal mucosa moist and pink , no lesions erythema or exudate. Lungs: Clear to auscultation bilaterally.  Heart: Regular rate and rhythm, no murmurs rubs or gallops.  Abdomen: Bowel sounds are normal, multiple well healed incisions. Left sided abd wall weakness with hyperactive bowel sounds. Tender in left abd but mostly over midline incision to the left of ostomy. Ostomy looks nice and pink, stool soft and light brown. no rebound or guarding.   Extremities: No lower extremity edema. No clubbing or deformities. Neuro: Alert and oriented x 4   Skin: Warm and dry, no jaundice.   Psych: Alert and cooperative, normal mood and affect.  Labs:  Lab Results  Component Value Date   CREATININE 1.3 09/21/2017   BUN 15.1 09/21/2017   NA 136 09/21/2017   K 4.1 09/21/2017   CL 106 03/16/2017   CO2 26 09/21/2017   Lab Results  Component Value Date   WBC 6.1 09/21/2017   HGB 11.2 (L) 09/21/2017   HCT 33.3 (L) 09/21/2017   MCV 93.3 09/21/2017   PLT 333 09/21/2017   Lab Results  Component Value Date   ALT 9 09/21/2017   AST 20 09/21/2017   ALKPHOS 81 09/21/2017   BILITOT 0.59 09/21/2017      Imaging Studies: No results found.

## 2017-09-22 NOTE — Assessment & Plan Note (Signed)
61 y/o male with h/o complicated Crohn's, multiple abd wall hernias and Speigelian hernia, metastatic prostate cancer with large pelvic mass who presents with 2 week h/o colicky abd pain. Would be concerned about partial bowel obstruction. Plan for CT A/P with contrast today (STAT). Further recommendations to follow.

## 2017-09-22 NOTE — Patient Instructions (Signed)
1. CT scan of your abdomen as scheduled.

## 2017-09-23 ENCOUNTER — Telehealth: Payer: Self-pay | Admitting: Oncology

## 2017-09-23 ENCOUNTER — Ambulatory Visit (HOSPITAL_BASED_OUTPATIENT_CLINIC_OR_DEPARTMENT_OTHER): Payer: Medicare Other | Admitting: Oncology

## 2017-09-23 ENCOUNTER — Other Ambulatory Visit: Payer: Self-pay | Admitting: Gastroenterology

## 2017-09-23 VITALS — BP 100/58 | HR 93 | Temp 98.6°F | Resp 18 | Ht 64.5 in | Wt 137.9 lb

## 2017-09-23 DIAGNOSIS — C61 Malignant neoplasm of prostate: Secondary | ICD-10-CM | POA: Diagnosis not present

## 2017-09-23 DIAGNOSIS — C7951 Secondary malignant neoplasm of bone: Secondary | ICD-10-CM | POA: Diagnosis not present

## 2017-09-23 MED ORDER — BUDESONIDE 3 MG PO CPEP: 9.0000 mg | ORAL_CAPSULE | Freq: Every day | ORAL | 1 refills | Status: DC

## 2017-09-23 NOTE — Progress Notes (Signed)
Per Dr. Gala Romney, low fiber diet, soft diet.  Needs ASAP appt with GI at [facility] previously seen by Dr. Eduard Roux. Dx: complicated active crohn's with partial small bowel obstruction.  In interim, begin entocort 9m daily, RX sent. He needs to stay on [facility] ov with [facility].  Return here to see rmr in 6 weeks.

## 2017-09-23 NOTE — Progress Notes (Signed)
Please let patient know that I have to address CT findings with Dr. Gala Romney.  He appears to have active Crohn's with small bowel inflammation and small bowel dilation/partial small bowel obstruction.  Would recommend he consume soft diet until further instructions. If worsening pain, vomiting, then go to ER.

## 2017-09-23 NOTE — Progress Notes (Signed)
Oncology aware of CT findings regarding prostate cancer, see OV noted from today by Dr. Alen Blew.

## 2017-09-23 NOTE — Telephone Encounter (Signed)
Scheduled appt per 10/3 los - Gave patient AVS and calender per los.

## 2017-09-23 NOTE — Progress Notes (Signed)
CC'ED TO PCP 

## 2017-09-23 NOTE — Progress Notes (Signed)
Hematology and Oncology Follow Up Visit  Daniel Howard [id number] January 10, 1956 61 y.o. 09/23/2017 9:40 AM Daniel Howard, Daniel Howard, [name], Daniel Nunnery, MD   Principle Diagnosis: 61 year old gentleman with prostate cancer diagnosed in March 2018. He was found to have a PSA of 31.3 and a large pelvic mass measuring 7.7 x 7.2 cm. He has diffuse pelvic adenopathy and bony disease.   Prior Therapy:   He is status post biopsy on 03/16/2017 of a pelvic mass which confirmed the presence of prostate cancer.  Taxotere chemotherapy at 75 mg/m started on 04/24/2017. He S/P 6 cycles  of therapy completed in August 2018.  Current therapy:  Androgen deprivation therapy ongoing at [facility].    Interim History: Daniel Howard presents today for a follow-up visit. Since the last visit, he completed chemotherapy without delayed complications. He denied any neuropathy, weight loss or appetite changes. He regained all of his energy and performance status is back to baseline.He denied any diarrhea or abdominal pain. He denied any hematochezia or melena. He denied dyspnea and exertion or excessive fatigue. His bowel habits are regular at this time without any exacerbation of his inflammatory bowel disease. He does report intermittent abdominal discomfort which is normal for him.   He does not report any headaches, blurry vision, syncope or seizures. He does not report any fevers, chills, sweats or weight loss. He does not report any chest pain, palpitation, orthopnea or leg edema. He does not report any cough, wheezing or hemoptysis. He does not report any nausea, vomiting or abdominal pain. He does not report any frequency urgency or hesitancy. He is not report any skeletal complaints of arthralgias or myalgias. Remaining review of systems unremarkable  Medications: I have reviewed the patient's current medications.  Current Outpatient Prescriptions  Medication Sig Dispense Refill  . Adalimumab (HUMIRA PEN) 40  MG/0.8ML PNKT Inject 40 mg into the skin every 14 (fourteen) days. 2 each 6  . calcium carbonate (OS-CAL) 600 MG TABS tablet Take 1,200 mg by mouth daily with breakfast.    . Ferrous Sulfate 28 MG TABS Take 1 tablet by mouth daily.    [name] HYDROcodone-acetaminophen (NORCO/VICODIN) 5-325 MG tablet Take 1 tablet by mouth every 6 (six) hours as needed for moderate pain. 60 tablet 0  . mirtazapine (REMERON) 15 MG tablet Take 1 tablet (15 mg total) by mouth at bedtime. 90 tablet 1  . Multiple Vitamins-Minerals (CENTRUM SILVER ADULT 50+) TABS Take 1 tablet by mouth daily.     No current facility-administered medications for this visit.      Allergies:  Allergies  Allergen Reactions  . Penicillins Hives    Past Medical History, Surgical history, Social history, and Family History were reviewed and updated.  Physical Exam: Blood pressure (!) 100/58, pulse 93, temperature 98.6 F (37 C), temperature source Oral, resp. rate 18, height 5' 4.5" (1.638 m), weight 137 lb 14.4 oz (62.6 kg), SpO2 100 %. ECOG: 1 General appearance:Well-appearing gentleman without distress. Head: Normocephalic, without obvious abnormality no oral ulcers or lesions. Neck: no adenopathy or masses. Lymph nodes: Cervical, supraclavicular, and axillary nodes normal. Heart:regular rate and rhythm, S1, S2 normal, no murmur, click, rub or gallop Lung:chest clear, no wheezing, rales, normal symmetric air entry Abdomin: soft, non-tender, without masses or organomegaly. EXT:no erythema, induration, or nodules   Lab Results: Lab Results  Component Value Date   WBC 6.1 09/21/2017   HGB 11.2 (L) 09/21/2017   HCT 33.3 (L) 09/21/2017   MCV 93.3 09/21/2017  PLT 333 09/21/2017     Chemistry      Component Value Date/Time   NA 136 09/21/2017 0921   K 4.1 09/21/2017 0921   CL 106 03/16/2017 0732   CO2 26 09/21/2017 0921   BUN 15.1 09/21/2017 0921   CREATININE 1.3 09/21/2017 0921      Component Value Date/Time   CALCIUM  9.7 09/21/2017 0921   ALKPHOS 81 09/21/2017 0921   AST 20 09/21/2017 0921   ALT 9 09/21/2017 0921   BILITOT 0.59 09/21/2017 0921      [name], Daniel Howard (MRN [id number]) as of 09/23/2017 09:43  Ref. Range 07/17/2017 08:31 08/07/2017 08:57 09/21/2017 09:21  Prostate Specific Ag, Serum Latest Ref Range: 0.0 - 4.0 ng/mL 0.5 0.4 0.3    EXAM: CT ABDOMEN AND PELVIS WITH CONTRAST  TECHNIQUE: Multidetector CT imaging of the abdomen and pelvis was performed using the standard protocol following bolus administration of intravenous contrast.  CONTRAST:  156m ISOVUE-300 IOPAMIDOL (ISOVUE-300) INJECTION 61%  COMPARISON:  CT 02/05/2017.  Whole-body bone scan 04/01/2017.  FINDINGS: Lower chest: Stable linear scarring at the left lung base. The lung bases are otherwise clear. No pleural or pericardial effusion.  Hepatobiliary: The liver is normal in density without focal abnormality. No evidence of gallstones or biliary dilatation. Dilatation of the common hepatic duct has improved, now measuring 8 mm. The duct tapers distally.  Pancreas: Stable mild pancreatic ductal dilatation. No evidence of pancreatic mass, surrounding inflammation or fluid collection.  Spleen: Normal in size without focal abnormality.  Adrenals/Urinary Tract: Both adrenal glands appear normal. No evidence of renal mass, hydronephrosis or delayed contrast excretion. 5 mm dependent calcification within the bladder lumen on image 76 is likely a small bladder calculus. There is no evidence of UVJ obstruction.  Stomach/Bowel: The stomach appears normal. There is mildly progressive dilatation of the small bowel. Right lower quadrant ileostomy is noted with wall thickening, hyperenhancement and surrounding inflammation involving the distal approximately 6 cm of the ileum. Proximal to this, there is progressive dilatation of a focal loop of small bowel, measuring up to 5.9 cm transverse  and approximately 11 cm in length (coronal image 26). There are probable adjacent inflamed loops of small bowel with resulting luminal stenosis and upstream dilatation. These are best seen on coronal images 18-24 and 03/11/1957. No definite fistula. Most of the colon has been previously resected. 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 The distal colon is decompressed without contrast material. Several loops of opacified small bowel are also present within the spigelian hernia.  Vascular/Lymphatic: Several enlarging ileocolonic mesenteric lymph nodes are noted, likely reactive. These measure 12 and 13 mm on image 48 of series 2. There is no significant residual enlarged pelvic lymph nodes. No significant vascular findings are seen. There is a retroaortic left renal vein.  Reproductive: The previously demonstrated large pelvic mass involving the prostate gland and seminal vesicles has decreased in size. This now measures approximately 6.2 x 5.2 cm and extends into the right greater sciatic notch.  Other: Stable postsurgical changes in the anterior abdominal wall and small right inguinal hernia containing fat. There is a stable small lumbar hernia on the right containing only fat (axial image 37. No ascites or focal extraluminal fluid collection.  Musculoskeletal: Tubular masses are again noted within the buttocks bilaterally extending lateral to both ischia. These demonstrate delayed enhancement and may reflect venous varicosities or  nerve sheath tumors. There are multiple enlarging sclerotic lesions in the bones consistent with metastatic disease. These are most prominent posteriorly in both iliac bones (axial image 60) and in the right ischium (image 80). There is also a prominent lesion posteriorly in the proximal right femur. No evidence of lytic lesion or pathologic fracture.  The sacroiliac joints are ankylosed.  IMPRESSION: 1. Multiple persistent areas of distal small bowel wall thickening and surrounding dilatation with intervening and proximal small bowel distension, most consistent with active Crohn's disease and resulting partial obstruction. No evidence of high-grade bowel obstruction. 2. 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 3. Enlarging lymph nodes in the right lower quadrant ileocolonic mesentery are likely reactive. The dominant pelvic mass and pelvic lymphadenopathy have improved. 4. Progressive osseous metastatic disease. 5. Tubular structures in both sciatic notches are similar to the previous studies and may reflect against varicosities or nerve sheath tumors.      Impression and Plan:   61 year old gentleman with the following issues:  1. Prostate cancer diagnosed in March 2018. He presented with advanced disease including pelvic mass, diffuse adenopathy as well as diffuse bony disease. He underwent a biopsy of a 7.7 x 7.2 mass of the right hemipelvis which confirmed the presence of prostate cancer. His PSA is 31.3. He has already been started on androgen deprivation therapy.  He is S/P Taxotere chemotherapy at 75 mg/m every 3 weeks for a total of 6 cycles completed in August 2018.  His laboratory data and a PSA from 09/21/2017 showed continued excellent response with a PSA of 0.3. CT scan obtained 09/22/2017 showed also positive response to therapy.  The plan is to continue with the current treatment with androgen deprivation therapy only and and different salvage therapy if his PSA starts to rise again.   2. IV access: Port-A-Cath will be flushed every 8 weeks.  3. Neutropenia prophylaxis: No longer needs growth factor support.  4. Bone directed therapy: This will be discussed in the future after obtaining dental clearance to  receive Xgeva or Zometa  5. Androgen deprivation therapy: This will continue indefinitely. He is currently receiving that at [facility].  6. Follow-up: Will be in 8 weeks to follow his progress.     Zola Button, MD 10/3/20189:40 AM

## 2017-09-24 ENCOUNTER — Encounter: Payer: Self-pay | Admitting: Internal Medicine

## 2017-09-24 NOTE — Progress Notes (Signed)
PATIENT SCHEDULED  °

## 2017-10-14 ENCOUNTER — Other Ambulatory Visit: Payer: Self-pay | Admitting: Family Medicine

## 2017-10-23 ENCOUNTER — Encounter: Payer: Self-pay | Admitting: Internal Medicine

## 2017-10-23 ENCOUNTER — Ambulatory Visit (INDEPENDENT_AMBULATORY_CARE_PROVIDER_SITE_OTHER): Payer: Medicare Other | Admitting: Internal Medicine

## 2017-10-23 VITALS — BP 109/68 | HR 63 | Temp 97.2°F | Ht 64.5 in | Wt 143.6 lb

## 2017-10-23 DIAGNOSIS — K50012 Crohn's disease of small intestine with intestinal obstruction: Secondary | ICD-10-CM

## 2017-10-23 DIAGNOSIS — C61 Malignant neoplasm of prostate: Secondary | ICD-10-CM

## 2017-10-23 NOTE — Progress Notes (Signed)
Primary Care Physician:  Alycia Rossetti, MD Primary Gastroenterologist:  Dr. Gala Romney  Pre-Procedure History & Physical: HPI:  Daniel Howard is a 61 y.o. male here for evaluation of complicated Crohn's disease; seen in ED for abdominal pain a month ago.  CT demonstrated small bowel wall thickening some dilation consistent with small bowel obstruction. Multiple hernias noted. And also progression of known osseous metastatic disease and enlarged lymph nodes consistent with metastatic prostate cancer.  He has been on Humira. Entocort 9 mg daily added to his regimen one month ago. He is back to his baseline. His ileostomy is functioning well;  he's gained 7 pounds. He has some mild dyspeptic symptoms for which he takes OTC ranitidine. Appetite is good; he's had no nausea or vomiting.  Past Medical History:  Diagnosis Date  . Abnormal finding of biliary tract    MRCP shows pancreatic/biliary tract dilation. EUS 2010 confirmed dilation but no chronic pancreaitis or mass. Vascular ectasia crimpoing distal CBD.   [name] Anxiety   . 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 Duodenal ulcer 2010   nsaids  . Low back pain   . Peristomal hernia   . Small bowel obstruction ([place])   . Spigelian hernia    bilateral    Past Surgical History:  Procedure Laterality Date  . APPENDECTOMY  1992   at time of exp laparotomy at which time he was noted to have fistulizing Crohn's rather than UC  . COLONOSCOPY [name] 08/23/2014   FEO:[id number] proctoscopy with possible fistulous opening in thebase of rectal/anal stump.    . COLOSTOMY  1992   transverse loop colostomy secondary  to a stricture  . ESOPHAGOGASTRODUODENOSCOPY  05/2009   SLF: multiple antral erosions, large ulcer at ansatomosis (postsurgical changes at duodenal bulb and second portion of duodenum) BX c/x NSAIDS.  . EUS  10/04/2009   Dr. Estill Bakes dilated CBD and main pancreatic duct.  No pancreatic  . FLEXIBLE SIGMOIDOSCOPY  1988   Dr. Laural Golden- suggested rohn's disease but the biopsies were not collaborative.  [name] FLEXIBLE SIGMOIDOSCOPY [name] 09/08/2016   Procedure: FLEXIBLE SIGMOIDOSCOPY;  Surgeon: Wonda Horner, MD;  [facility]: [facility] ENDOSCOPY;  Service: Gastroenterology;  Laterality: [name];  . HEMICOLOECTOMY W/ ANASTOMOSIS  1993   R- Dr.DeMason   . HERNIA REPAIR  1996   incarcerated periostial hernia with additional surgery in 1999  . IR FLUORO GUIDE PORT INSERTION RIGHT  04/03/2017  . IR US GUIDE VASC ACCESS RIGHT  04/03/2017    Prior to Admission medications   Medication Sig Start Date End Date Taking? Authorizing Provider  Adalimumab (HUMIRA PEN) 40 MG/0.8ML PNKT Inject 40 mg into the skin every 14 (fourteen) days. 12/19/16  Yes Annitta Needs, NP  budesonide (ENTOCORT EC) 3 MG 24 hr capsule Take 3 capsules (9 mg total) by mouth daily. 09/23/17  Yes Mahala Menghini, PA-C  calcium carbonate (OS-CAL) 600 MG TABS tablet Take 1,200 mg by mouth daily with breakfast.   Yes [provider]  Ferrous Sulfate 28 MG TABS Take 1 tablet by mouth daily.   Yes [provider]  HYDROcodone-acetaminophen (NORCO/VICODIN) 5-325 MG tablet Take 1 tablet by mouth every 6 (six) hours  as needed for moderate pain. 09/11/17  Yes McDowell, [name], MD  mirtazapine (REMERON) 15 MG tablet Take 1 tablet (15 mg total) by mouth at bedtime. 09/11/17  Yes [place], [name], MD  Multiple Vitamins-Minerals (CENTRUM SILVER ADULT 50+) TABS Take 1 tablet by mouth daily.   Yes [provider]  mirtazapine (REMERON) 15 MG tablet TAKE 1 TABLET BY MOUTH AT BEDTIME Patient not taking: Reported on 10/23/2017 10/14/17   Alycia Rossetti, MD    Allergies as of 10/23/2017 - Review Complete 10/23/2017  Allergen Reaction Noted  . Penicillins Hives 01/25/2007    Family History  Problem Relation Age of Onset  . Cancer Father        prostate   . Prostate cancer Father   . Hypertension Sister   . Cancer Sister   . Depression Sister   . Breast cancer Sister   . COPD Sister   . Aneurysm Brother        deceased, brain aneurysm    Social History   Social History  . Marital status: Married    Spouse name: [name]  . Number of children: 34  . Years of education: [name]   Occupational History  . disabled Unemployed   Social History Main Topics  . Smoking status: Former Smoker    Packs/day: 0.50    Years: 2.00    Types: Cigarettes    Quit date: 12/21/2009  . Smokeless tobacco: Never Used     Comment: Quit abut 20 years  . Alcohol use No     Comment: Former drinker  . Drug use: No  . Sexual activity: Yes    Partners: Female   Other Topics Concern  . Not on file   Social History Narrative  . No narrative on file    Review of Systems: See HPI, otherwise negative ROS  Physical Exam: BP 109/68   Pulse 63   Temp (!) 97.2 F (36.2 C) (Oral)   Ht 5' 4.5" (1.638 m)   Wt 143 lb 9.6 oz (65.1 kg)   BMI 24.27 kg/m  General:   Alert,  , pleasant and cooperative in NAD;  Appears this baseline;  Accompanied by his wife.. Neck:  Supple; no masses or thyromegaly. No significant cervical adenopathy. Lungs:  Clear throughout to auscultation.   No wheezes, crackles, or rhonchi. No acute distress. Heart:  Regular rate and rhythm; no murmurs, clicks, rubs,  or gallops. Abdomen: nondistended. Multiple surgical scars present. Ileostomy right lower quadrant with some prolapsed mucosa easily reducible. Multiple abdominal wall defects consistent with known hernias. Abdomen is nontender.  Pulses:  Normal pulses noted. Extremities:  Without clubbing or edema.  Impression:  Complicated history of Crohn's disease with  active inflammation partial small bowel obstruction seen on recent CT scanning. Unfortunately, also progressive of  metastatic prostate cancer also noted. However, clinically, from a standpoint of Crohn's he is back to baseline with the addition of the budesonide to Humira.  Some dyspepsia responsive to her when necessary ranitidine.  Recommendations: Will decrease budesonide to 6 mg daily. Continue Humira. We'll see him back in 2 months. We'll try to get him down to 3 mg daily. If, after a budesonide taper, he flares once again, will consider Checking Humira antibodies and drug levels. If he has any interim difficulties he is to call.                       Notice: This dictation was prepared  with Dragon dictation along with smaller phrase technology. Any transcriptional errors that result from this process are unintentional and may not be corrected upon review.

## 2017-10-23 NOTE — Patient Instructions (Addendum)
Decrease Entocort to 6 mg (2 tabs) daily  Continue Humira  May continue ranitidine (Zantac) for dyspepsia  Office visit in 2 months

## 2017-10-27 DIAGNOSIS — C61 Malignant neoplasm of prostate: Secondary | ICD-10-CM | POA: Diagnosis not present

## 2017-10-29 NOTE — Progress Notes (Signed)
Patient seen by RMR and no plans for referral right now.

## 2017-11-03 DIAGNOSIS — C61 Malignant neoplasm of prostate: Secondary | ICD-10-CM | POA: Diagnosis not present

## 2017-11-05 ENCOUNTER — Telehealth: Payer: Self-pay

## 2017-11-05 NOTE — Telephone Encounter (Signed)
Pts spouse notified. Pt will pick samples of Dexilant 63m up and continue Zantac.   FYI Pts spouse said that pt will not keep taking Entocort due to making him sick. Pts spouse states that he is not himself and people around him can notice that he doesn't feel well.   Pt will call back in 1 week to give an update.

## 2017-11-05 NOTE — Telephone Encounter (Signed)
Pt spouse called with complaints of pts Entocort 102m tabs which are being taken bid daily since pts last ov. Since pt has been on med, spouse reports he hasn't been the same. C/o severe gas, nausea, stomach pain and watery stool. Pt is taking Entocort with food. Please advise.

## 2017-11-05 NOTE — Telephone Encounter (Signed)
So, he is having symptoms that are nonspecific. Could be flaring with Crohn's with decreasing dose of Entocort.  May just be dyspepsia/GERD on ranitidine.  I recommend he continue Entocort of 6 mg daily. Give him a three-week course of day of Dexilant 60 mg daily in lieu of ranitidine - samples OK. The patient is to let us know how he's doing the first of the week. He may or may not need further evaluation.

## 2017-11-05 NOTE — Telephone Encounter (Signed)
Communication noted.  

## 2017-11-06 ENCOUNTER — Telehealth: Payer: Self-pay | Admitting: Internal Medicine

## 2017-11-06 NOTE — Telephone Encounter (Signed)
Spoke with pts spouse, she needed clarity of how to take medication. Pt will call with an update early next week as asked yesterday per RMR.

## 2017-11-06 NOTE — Telephone Encounter (Signed)
Pt's wife called to speak with nurse. She has questions about patient's medication. Please call her back at [phone]

## 2017-11-09 ENCOUNTER — Telehealth: Payer: Self-pay | Admitting: Internal Medicine

## 2017-11-09 NOTE — Telephone Encounter (Signed)
Spoke with pt and he continues to feel bad. Pt started Dexilant 61m one tab daily as directed last week, continues to take Entocrot twice daily as directed last week. Pt reports weight loss, excessive gas with belching, tightness of stomach with pain and loss of appetite.

## 2017-11-09 NOTE — Telephone Encounter (Signed)
Noted pt, notified. Routing message.

## 2017-11-09 NOTE — Telephone Encounter (Signed)
He needs an office with with extender this week.

## 2017-11-09 NOTE — Telephone Encounter (Signed)
PATIENT SCHEDULED FOR 11/10/17

## 2017-11-09 NOTE — Telephone Encounter (Signed)
[place], HIS GAS MEDICATION IS NOT WORKING

## 2017-11-10 ENCOUNTER — Encounter: Payer: Self-pay | Admitting: Internal Medicine

## 2017-11-10 ENCOUNTER — Other Ambulatory Visit (HOSPITAL_COMMUNITY)
Admission: RE | Admit: 2017-11-10 | Discharge: 2017-11-10 | Disposition: A | Discharge status: Home / Self Care | Payer: Medicare Other | Source: Ambulatory Visit | Attending: Nurse Practitioner | Admitting: Nurse Practitioner

## 2017-11-10 ENCOUNTER — Encounter: Payer: Self-pay | Admitting: Nurse Practitioner

## 2017-11-10 ENCOUNTER — Ambulatory Visit (INDEPENDENT_AMBULATORY_CARE_PROVIDER_SITE_OTHER): Payer: Medicare Other | Admitting: Nurse Practitioner

## 2017-11-10 VITALS — BP 116/74 | HR 100 | Temp 98.3°F | Ht 64.5 in | Wt 129.4 lb

## 2017-11-10 DIAGNOSIS — R1084 Generalized abdominal pain: Secondary | ICD-10-CM

## 2017-11-10 DIAGNOSIS — K50819 Crohn's disease of both small and large intestine with unspecified complications: Secondary | ICD-10-CM | POA: Diagnosis not present

## 2017-11-10 DIAGNOSIS — R1115 Cyclical vomiting syndrome unrelated to migraine: Secondary | ICD-10-CM

## 2017-11-10 DIAGNOSIS — R109 Unspecified abdominal pain: Secondary | ICD-10-CM | POA: Diagnosis not present

## 2017-11-10 DIAGNOSIS — G43A0 Cyclical vomiting, not intractable: Secondary | ICD-10-CM | POA: Diagnosis not present

## 2017-11-10 DIAGNOSIS — R110 Nausea: Secondary | ICD-10-CM | POA: Insufficient documentation

## 2017-11-10 DIAGNOSIS — R69 Illness, unspecified: Secondary | ICD-10-CM | POA: Insufficient documentation

## 2017-11-10 DIAGNOSIS — R634 Abnormal weight loss: Secondary | ICD-10-CM

## 2017-11-10 MED ORDER — ONDANSETRON HCL 4 MG PO TABS: 4.0000 mg | ORAL_TABLET | Freq: Three times a day (TID) | ORAL | 1 refills | Status: DC | PRN

## 2017-11-10 MED ORDER — BUDESONIDE 3 MG PO CPEP: 9.0000 mg | ORAL_CAPSULE | Freq: Every day | ORAL | 0 refills | Status: DC

## 2017-11-10 NOTE — Patient Instructions (Signed)
1. Have your labs completed when you are able to. 2. We will help you schedule the CT scan of your abdomen. 3. I have sent in Zofran 4 mg to your pharmacy.  You can take this every 8 hours as needed for nausea/vomiting. 4. Restart Entocort 9 mg once a day.  I have sent a new prescription to your pharmacy in case she do not have enough medication.  Do this for 30 days, for now. 5. Call us early next week on Monday or Tuesday and let us know how you are doing. 6. Return for follow-up in 2-4 weeks. 7. Call if you have any questions or concerns.    Happy Thanksgiving!!!

## 2017-11-10 NOTE — Progress Notes (Signed)
Referring Provider: Alycia Rossetti, MD Primary Care Physician:  Alycia Rossetti, MD Primary GI:  Dr. Gala Romney  Chief Complaint  Patient presents with  . Gas    stopped Dexilant  . Abdominal Pain  . Nausea  . Emesis    yesterday  . loss of appetite    HPI:   Daniel Howard is a 61 y.o. male who presents for follow-up on gas.  The patient was last seen in our office 10/23/2017 for Crohn's disease.  Recently seen in the emergency department about 1 month ago with CT demonstration of small bowel wall thickening and some dilation consistent with small bowel obstruction, multiple hernias, progression of known osseous metastatic disease and enlarged lymph nodes consistent with metastatic prostate cancer.  He has been on Humira.  Entocort 9 mg daily added to his regimen a month prior and he is back to baseline.  Ileostomy functioning well, has gained some weight back.  Some dyspepsia for which she takes ranitidine.  Recommended decrease for desonide to 6 mg daily, continue Humira, return for follow-up in 2 months and consider decrease in the desonide to 3 mg daily.  Goal of taper budesonide to off and if another flare occurs consider Humira antibodies and drug levels.  Follow-up phone call your office on the 15th of this month indicated some nonspecific symptoms such as nausea, gas, vomiting, decreased appetite that the patient felt was due to Entocort.  Recommended continue Entocort 6 mg daily and start Dexilant 60 mg daily rather than ranitidine.  Possible flare of Crohn's with decreasing Entocort dosage.  The patient's bowels declined to continue Entocort.  Patient called back yesterday and is still having symptoms.  Dexilant not helping, continues to take Entocort twice daily as directed.  Noted weight loss, excessive gas and belching, abdominal tightness with pain and loss of appetite.  Recommended follow-up this week.  Last CT of the abdomen completed 09/22/2017 and as per above.  He last  saw oncology 09/23/2017 for prostate cancer with abdominal mass, lymph nodes, osseous disease.  Recently completed chemotherapy in August.  Undergoing androgen deprivation therapy at [facility].  No other symptoms at that time.  Recommended continue current treatments with androgen deprivation therapy only and consider salvage therapy if PSA begins to rise again.  Also continue bone directed therapy.  Follow-up in 8 weeks.  Today he states he's still not feeling well. He stopped taking budesonide about 2 days ago. Feels better since when he was taking it. He is still on Humira. Still with gas/bloating. He has been losing weight unintentionally (objctively 13-14 lbs in the past 2.5 weeks since last in our office). Denies abdominal pain. His ileostomy is filling with air/gas. Also with N/V, last episode of emesis was yesterday morning. Vomiting is not daily. Is able to keep down fluids, mostly eating clear liquids not much solid foods. Decreased appetite, also with weakness and fatigue. Denies hematochezia, melena, fever, chills, acute changes in bowel movements/habits/consistency. Most of his stools are loose at this time. Denies chest pain, dyspnea, dizziness, lightheadedness, syncope, near syncope. Denies any other upper or lower GI symptoms.  All his symptoms started when he stepped down to Entocord 6 mg daily, are somewhat better off entocort, but was doing very well on Entocort 9 mg.  Past Medical History:  Diagnosis Date  . Abnormal finding of biliary tract    MRCP shows pancreatic/biliary tract dilation. EUS 2010 confirmed dilation but no chronic pancreaitis or mass. Vascular ectasia  crimpoing distal CBD.   [name] Anxiety   . 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 Duodenal ulcer 2010   nsaids  . Low back pain   . Peristomal hernia   . Small bowel obstruction ([place])   . Spigelian hernia    bilateral    Past Surgical History:  Procedure Laterality Date  . APPENDECTOMY  1992   at time of exp laparotomy at which time he was noted to have fistulizing Crohn's rather than UC  . COLONOSCOPY THROUGH RECTUM AND OSTOMY [name] 08/23/2014   Performed by Daneil Dolin, MD at [place]  . COLOSTOMY  1992   transverse loop colostomy secondary to a stricture  . ESOPHAGOGASTRODUODENOSCOPY  05/2009   SLF: multiple antral erosions, large ulcer at ansatomosis (postsurgical changes at duodenal bulb and second portion of duodenum) BX c/x NSAIDS.  . EUS  10/04/2009   Dr. Estill Bakes dilated CBD and main pancreatic duct.  No pancreatic  . FLEXIBLE SIGMOIDOSCOPY  1988   Dr. Laural Golden- suggested rohn's disease but the biopsies were not collaborative.  [name] FLEXIBLE SIGMOIDOSCOPY [name] 09/08/2016   Performed by Wonda Horner, MD at [facility] ENDOSCOPY  . HEMICOLOECTOMY W/ ANASTOMOSIS  1993   R- Dr.DeMason   . HERNIA REPAIR  1996   incarcerated periostial hernia with additional surgery in 1999  . IR FLUORO GUIDE PORT INSERTION RIGHT  04/03/2017  . IR US GUIDE VASC ACCESS RIGHT  04/03/2017    Current Outpatient Medications  Medication Sig Dispense Refill  . Adalimumab (HUMIRA PEN) 40 MG/0.8ML PNKT Inject 40 mg into the skin every 14 (fourteen) days. 2 each 6  . calcium carbonate (OS-CAL) 600 MG TABS tablet Take 1,200 mg by mouth daily with breakfast.    . Ferrous Sulfate 28 MG TABS Take 1 tablet by mouth daily.    [name] HYDROcodone-acetaminophen (NORCO/VICODIN) 5-325 MG tablet Take 1 tablet by mouth every 6 (six) hours as needed for moderate pain. 60 tablet 0  . mirtazapine (REMERON) 15 MG tablet Take 1 tablet (15 mg total) by mouth at bedtime. 90 tablet 1  . mirtazapine  (REMERON) 15 MG tablet TAKE 1 TABLET BY MOUTH AT BEDTIME 90 tablet 1  . Multiple Vitamins-Minerals (CENTRUM SILVER ADULT 50+) TABS Take 1 tablet by mouth daily.    . Simethicone (GAS-X PO) Take by mouth as needed.     No current facility-administered medications for this visit.     Allergies as of 11/10/2017 - Review Complete 11/10/2017  Allergen Reaction Noted  . Penicillins Hives 01/25/2007    Family History  Problem Relation Age of Onset  . Cancer Father        prostate   . Prostate cancer Father   . Colon cancer Father 36  . Hypertension Sister   . Cancer Sister   . Depression Sister   . Breast cancer Sister   . COPD Sister   . Aneurysm Brother        deceased, brain aneurysm    Social History   Socioeconomic History  . Marital status: Married    Spouse name: None  . Number of children: 9  . Years of education: None  . Highest education  level: None  Social Needs  . Financial resource strain: None  . Food insecurity - worry: None  . Food insecurity - inability: None  . Transportation needs - medical: None  . Transportation needs - non-medical: None  Occupational History  . Occupation: disabled    Fish farm manager: UNEMPLOYED  Tobacco Use  . Smoking status: Former Smoker    Packs/day: 0.50    Years: 2.00    Pack years: 1.00    Types: Cigarettes    Last attempt to quit: 12/21/2009    Years since quitting: 7.8  . Smokeless tobacco: Never Used  . Tobacco comment: Quit abut 20 years  Substance and Sexual Activity  . Alcohol use: No    Alcohol/week: 0.0 oz    Comment: Former drinker  . Drug use: No  . Sexual activity: Yes    Partners: Female  Other Topics Concern  . None  Social History Narrative  . None    Review of Systems: General: Negative for anorexia, weight loss, fever, chills, fatigue, weakness. Eyes: Negative for vision changes.  ENT: Negative for hoarseness, difficulty swallowing , nasal congestion. CV: Negative for chest pain, angina,  palpitations, dyspnea on exertion, peripheral edema.  Respiratory: Negative for dyspnea at rest, dyspnea on exertion, cough, sputum, wheezing.  GI: See history of present illness. GU:  Negative for dysuria, hematuria, urinary incontinence, urinary frequency, nocturnal urination.  MS: Negative for joint pain, low back pain.  Derm: Negative for rash or itching.  Neuro: Negative for weakness, abnormal sensation, seizure, frequent headaches, memory loss, confusion.  Psych: Negative for anxiety, depression, suicidal ideation, hallucinations.  Endo: Negative for unusual weight change.  Heme: Negative for bruising or bleeding. Allergy: Negative for rash or hives.   Physical Exam: BP 116/74   Pulse 100   Temp 98.3 F (36.8 C) (Oral)   Ht 5' 4.5" (1.638 m)   Wt 129 lb 6.4 oz (58.7 kg)   BMI 21.87 kg/m  General:   Alert and oriented. Pleasant and cooperative. Well-nourished and well-developed.  Head:  Normocephalic and atraumatic. Eyes:  Without icterus, sclera clear and conjunctiva pink.  Ears:  Normal auditory acuity. Mouth:  No deformity or lesions, oral mucosa pink.  Throat/Neck:  Supple, without mass or thyromegaly. Cardiovascular:  S1, S2 present without murmurs appreciated. Normal pulses noted. Extremities without clubbing or edema. Respiratory:  Clear to auscultation bilaterally. No wheezes, rales, or rhonchi. No distress.  Gastrointestinal:  +BS, soft, non-tender and non-distended. No HSM noted. No guarding or rebound. No masses appreciated.  Rectal:  Deferred  Musculoskalatal:  Symmetrical without gross deformities. Normal posture. Skin:  Intact without significant lesions or rashes. Neurologic:  Alert and oriented x4;  grossly normal neurologically. Psych:  Alert and cooperative. Normal mood and affect. Heme/Lymph/Immune: No significant cervical adenopathy. No excessive bruising noted.    11/10/2017 11:55 AM   Disclaimer: This note was dictated with voice recognition  software. Similar sounding words can inadvertently be transcribed and may not be corrected upon review.

## 2017-11-10 NOTE — Assessment & Plan Note (Signed)
The patient has had significant weight loss since his symptoms started.  Since we last saw him in the office about 2-1/2 weeks ago he has lost approximately 14 pounds.  This is in addition to his abdominal pain, nausea, vomiting, fatigue.  Routine labs and Prom[facility] panel as per above.  I will also check a CT of the abdomen and pelvis with contrast to rule out any rapidly changing cancer etiology.  Return for follow-up in 2-4 weeks.  ER precautions given.

## 2017-11-10 NOTE — Progress Notes (Signed)
cc'ed to pcp °

## 2017-11-10 NOTE — Assessment & Plan Note (Signed)
Abdominal pain which the patient attributes to gas pains.  He has noted significant increase in gas along with his other nonspecific symptoms.  His ileostomy bag fills with air and he has to burp it more frequently.  At this point Gas-X over-the-counter is not helping significantly.  The best way to treat his symptoms are to work him up for etiology and treat the cause.  Further workup as per above.  Return for follow-up in 2-4 weeks.

## 2017-11-10 NOTE — Assessment & Plan Note (Signed)
History of complicated Crohn's disease on Humira.  Recent flare improved with Entocort 9 mg daily.  He began having a host of nonspecific symptoms when he stepped down to 6 mg.  He has since stopped taking Entocort and is having some improvement but still has persistent symptoms.  At this point I will check routine labs including CBC, CMP.  I will also check a Prometheus panel for Humira antibodies and drug level.  I will start him back on Entocort 9 mg daily and have him do a quick progress report early next week as we are closed the end of this week.  ER precautions were discussed with the patient as well.  We will see if Entocort 9 mg improves his symptoms.  There is a long list of differentials that could be contributing to his symptoms including Crohn's disease flare, sequela of abdominal/pelvic mass, sequela of advancing metastatic prostate cancer.  Return for follow-up in 2-4 weeks.

## 2017-11-10 NOTE — Assessment & Plan Note (Signed)
The patient is complaining of non-intractable nausea and vomiting.  Also has poor appetite and fatigue.  At this point he is eating mostly clear liquids.  Last episode of emesis was yesterday.  This is another nonspecific/vague symptom in addition to the others as described in HPI and above.  At this point we will treat his nausea symptomatically with Zofran.  Further workup as per above.  Return for follow-up in 2-4 weeks.

## 2017-11-13 ENCOUNTER — Encounter: Payer: Medicare Other | Admitting: Family Medicine

## 2017-11-14 ENCOUNTER — Encounter (HOSPITAL_COMMUNITY): Payer: Self-pay

## 2017-11-14 ENCOUNTER — Inpatient Hospital Stay (HOSPITAL_COMMUNITY)
Admission: EM | Admit: 2017-11-14 | Discharge: 2017-11-16 | DRG: 385 | Disposition: A | Discharge status: Home / Self Care | Payer: Medicare Other | Attending: Internal Medicine | Admitting: Internal Medicine

## 2017-11-14 ENCOUNTER — Emergency Department (HOSPITAL_COMMUNITY): Payer: Medicare Other

## 2017-11-14 ENCOUNTER — Other Ambulatory Visit: Payer: Self-pay

## 2017-11-14 DIAGNOSIS — Z818 Family history of other mental and behavioral disorders: Secondary | ICD-10-CM | POA: Diagnosis not present

## 2017-11-14 DIAGNOSIS — Z803 Family history of malignant neoplasm of breast: Secondary | ICD-10-CM | POA: Diagnosis not present

## 2017-11-14 DIAGNOSIS — F419 Anxiety disorder, unspecified: Secondary | ICD-10-CM | POA: Diagnosis present

## 2017-11-14 DIAGNOSIS — Z933 Colostomy status: Secondary | ICD-10-CM | POA: Diagnosis not present

## 2017-11-14 DIAGNOSIS — Z79899 Other long term (current) drug therapy: Secondary | ICD-10-CM | POA: Diagnosis not present

## 2017-11-14 DIAGNOSIS — Z9049 Acquired absence of other specified parts of digestive tract: Secondary | ICD-10-CM | POA: Diagnosis not present

## 2017-11-14 DIAGNOSIS — Z9221 Personal history of antineoplastic chemotherapy: Secondary | ICD-10-CM | POA: Diagnosis not present

## 2017-11-14 DIAGNOSIS — N179 Acute kidney failure, unspecified: Secondary | ICD-10-CM

## 2017-11-14 DIAGNOSIS — Z880 Allergy status to penicillin: Secondary | ICD-10-CM | POA: Diagnosis not present

## 2017-11-14 DIAGNOSIS — Z87891 Personal history of nicotine dependence: Secondary | ICD-10-CM | POA: Diagnosis not present

## 2017-11-14 DIAGNOSIS — Z825 Family history of asthma and other chronic lower respiratory diseases: Secondary | ICD-10-CM

## 2017-11-14 DIAGNOSIS — Z6821 Body mass index (BMI) 21.0-21.9, adult: Secondary | ICD-10-CM

## 2017-11-14 DIAGNOSIS — Z932 Ileostomy status: Secondary | ICD-10-CM | POA: Diagnosis not present

## 2017-11-14 DIAGNOSIS — K50012 Crohn's disease of small intestine with intestinal obstruction: Secondary | ICD-10-CM | POA: Diagnosis not present

## 2017-11-14 DIAGNOSIS — K439 Ventral hernia without obstruction or gangrene: Secondary | ICD-10-CM | POA: Diagnosis present

## 2017-11-14 DIAGNOSIS — E860 Dehydration: Secondary | ICD-10-CM | POA: Diagnosis present

## 2017-11-14 DIAGNOSIS — Z8042 Family history of malignant neoplasm of prostate: Secondary | ICD-10-CM | POA: Diagnosis not present

## 2017-11-14 DIAGNOSIS — E871 Hypo-osmolality and hyponatremia: Secondary | ICD-10-CM | POA: Diagnosis present

## 2017-11-14 DIAGNOSIS — C7951 Secondary malignant neoplasm of bone: Secondary | ICD-10-CM | POA: Diagnosis present

## 2017-11-14 DIAGNOSIS — K50918 Crohn's disease, unspecified, with other complication: Secondary | ICD-10-CM | POA: Diagnosis not present

## 2017-11-14 DIAGNOSIS — C61 Malignant neoplasm of prostate: Secondary | ICD-10-CM | POA: Diagnosis present

## 2017-11-14 DIAGNOSIS — Z79891 Long term (current) use of opiate analgesic: Secondary | ICD-10-CM | POA: Diagnosis not present

## 2017-11-14 DIAGNOSIS — Z800 Family history of malignant neoplasm of digestive organs: Secondary | ICD-10-CM

## 2017-11-14 DIAGNOSIS — E43 Unspecified severe protein-calorie malnutrition: Secondary | ICD-10-CM | POA: Diagnosis present

## 2017-11-14 DIAGNOSIS — Z8249 Family history of ischemic heart disease and other diseases of the circulatory system: Secondary | ICD-10-CM

## 2017-11-14 DIAGNOSIS — D72829 Elevated white blood cell count, unspecified: Secondary | ICD-10-CM | POA: Diagnosis present

## 2017-11-14 DIAGNOSIS — Z8711 Personal history of peptic ulcer disease: Secondary | ICD-10-CM

## 2017-11-14 DIAGNOSIS — K56609 Unspecified intestinal obstruction, unspecified as to partial versus complete obstruction: Secondary | ICD-10-CM | POA: Diagnosis present

## 2017-11-14 DIAGNOSIS — K449 Diaphragmatic hernia without obstruction or gangrene: Secondary | ICD-10-CM | POA: Diagnosis not present

## 2017-11-14 DIAGNOSIS — K50912 Crohn's disease, unspecified, with intestinal obstruction: Secondary | ICD-10-CM | POA: Diagnosis not present

## 2017-11-14 LAB — CBC WITH DIFFERENTIAL/PLATELET
Basophils Absolute: 0 10*3/uL (ref 0.0–0.1)
Basophils Relative: 0 %
Eosinophils Absolute: 0 10*3/uL (ref 0.0–0.7)
Eosinophils Relative: 0 %
HCT: 36.3 % — ABNORMAL LOW (ref 39.0–52.0)
Hemoglobin: 12.2 g/dL — ABNORMAL LOW (ref 13.0–17.0)
Lymphocytes Relative: 19 %
Lymphs Abs: 2.3 10*3/uL (ref 0.7–4.0)
MCH: 29.7 pg (ref 26.0–34.0)
MCHC: 33.6 g/dL (ref 30.0–36.0)
MCV: 88.3 fL (ref 78.0–100.0)
Monocytes Absolute: 1.1 10*3/uL — ABNORMAL HIGH (ref 0.1–1.0)
Monocytes Relative: 9 %
Neutro Abs: 8.3 10*3/uL — ABNORMAL HIGH (ref 1.7–7.7)
Neutrophils Relative %: 72 %
Platelets: 458 10*3/uL — ABNORMAL HIGH (ref 150–400)
RBC: 4.11 MIL/uL — ABNORMAL LOW (ref 4.22–5.81)
RDW: 14.1 % (ref 11.5–15.5)
WBC: 11.7 10*3/uL — ABNORMAL HIGH (ref 4.0–10.5)

## 2017-11-14 LAB — COMPREHENSIVE METABOLIC PANEL
ALT: 15 U/L — ABNORMAL LOW (ref 17–63)
ANION GAP: 13 (ref 5–15)
AST: 22 U/L (ref 15–41)
Albumin: 3.3 g/dL — ABNORMAL LOW (ref 3.5–5.0)
Alkaline Phosphatase: 77 U/L (ref 38–126)
BUN: 20 mg/dL (ref 6–20)
CHLORIDE: 86 mmol/L — AB (ref 101–111)
CO2: 31 mmol/L (ref 22–32)
Calcium: 9 mg/dL (ref 8.9–10.3)
Creatinine, Ser: 1.35 mg/dL — ABNORMAL HIGH (ref 0.61–1.24)
GFR calc Af Amer: >60 mL/min (ref >60)
GFR, EST NON AFRICAN AMERICAN: 56 mL/min — AB (ref >60)
Glucose, Bld: 99 mg/dL (ref 65–99)
POTASSIUM: 4.3 mmol/L (ref 3.5–5.1)
Sodium: 130 mmol/L — ABNORMAL LOW (ref 135–145)
Total Bilirubin: 1.5 mg/dL — ABNORMAL HIGH (ref 0.3–1.2)
Total Protein: 8.5 g/dL — ABNORMAL HIGH (ref 6.5–8.1)

## 2017-11-14 LAB — URINALYSIS, ROUTINE W REFLEX MICROSCOPIC
Bilirubin Urine: NEGATIVE
Glucose, UA: NEGATIVE mg/dL
HGB URINE DIPSTICK: NEGATIVE
Ketones, ur: 80 mg/dL — AB
LEUKOCYTES UA: NEGATIVE
NITRITE: NEGATIVE
PROTEIN: NEGATIVE mg/dL
SPECIFIC GRAVITY, URINE: 1.024 (ref 1.005–1.030)
pH: 5 (ref 5.0–8.0)

## 2017-11-14 LAB — LIPASE, BLOOD: LIPASE: 24 U/L (ref 11–51)

## 2017-11-14 MED ORDER — IOPAMIDOL (ISOVUE-300) INJECTION 61%
100.0000 mL | Freq: Once | INTRAVENOUS | Status: AC | PRN
  Administered 2017-11-14: 100 mL via INTRAVENOUS

## 2017-11-14 MED ORDER — SODIUM CHLORIDE 0.9 % IV BOLUS (SEPSIS)
500.0000 mL | Freq: Once | INTRAVENOUS | Status: AC
  Administered 2017-11-14: 500 mL via INTRAVENOUS

## 2017-11-14 MED ORDER — IOPAMIDOL (ISOVUE-300) INJECTION 61%
INTRAVENOUS | Status: AC
  Filled 2017-11-14: qty 30

## 2017-11-14 MED ORDER — MORPHINE SULFATE (PF) 2 MG/ML IV SOLN
2.0000 mg | Freq: Once | INTRAVENOUS | Status: AC
  Administered 2017-11-14: 2 mg via INTRAVENOUS
  Filled 2017-11-14: qty 1

## 2017-11-14 MED ORDER — MORPHINE SULFATE (PF) 2 MG/ML IV SOLN: 2.0000 mg | INTRAVENOUS | Status: DC | PRN

## 2017-11-14 MED ORDER — ONDANSETRON HCL 4 MG/2ML IJ SOLN
4.0000 mg | Freq: Once | INTRAMUSCULAR | Status: AC
  Administered 2017-11-14: 4 mg via INTRAVENOUS
  Filled 2017-11-14: qty 2

## 2017-11-14 MED ORDER — MORPHINE SULFATE (PF) 4 MG/ML IV SOLN: 2.0000 mg | INTRAVENOUS | Status: DC | PRN

## 2017-11-14 MED ORDER — METHYLPREDNISOLONE SODIUM SUCC 125 MG IJ SOLR
60.0000 mg | Freq: Three times a day (TID) | INTRAMUSCULAR | Status: DC
  Administered 2017-11-14 – 2017-11-16 (×6): 60 mg via INTRAVENOUS
  Filled 2017-11-14 (×6): qty 2

## 2017-11-14 MED ORDER — HYDROMORPHONE HCL 1 MG/ML IJ SOLN
0.5000 mg | INTRAMUSCULAR | Status: AC | PRN
  Administered 2017-11-14 – 2017-11-15 (×2): 0.5 mg via INTRAVENOUS
  Filled 2017-11-14: qty 1
  Filled 2017-11-14: qty 0.5

## 2017-11-14 MED ORDER — IOPAMIDOL (ISOVUE-300) INJECTION 61%
INTRAVENOUS | Status: AC
  Filled 2017-11-14: qty 100

## 2017-11-14 MED ORDER — ONDANSETRON HCL 4 MG/2ML IJ SOLN: 4.0000 mg | Freq: Four times a day (QID) | INTRAMUSCULAR | Status: DC | PRN

## 2017-11-14 MED ORDER — SODIUM CHLORIDE 0.9 % IV SOLN
INTRAVENOUS | Status: AC
  Administered 2017-11-14 – 2017-11-15 (×4): via INTRAVENOUS

## 2017-11-14 MED ORDER — IOPAMIDOL (ISOVUE-300) INJECTION 61%
30.0000 mL | Freq: Once | INTRAVENOUS | Status: AC | PRN
  Administered 2017-11-14: 30 mL via ORAL

## 2017-11-14 MED ORDER — LIDOCAINE-PRILOCAINE 2.5-2.5 % EX CREA
1.0000 "application " | TOPICAL_CREAM | CUTANEOUS | Status: DC | PRN
  Filled 2017-11-14: qty 5

## 2017-11-14 MED ORDER — ENOXAPARIN SODIUM 40 MG/0.4ML ~~LOC~~ SOLN
40.0000 mg | Freq: Every day | SUBCUTANEOUS | Status: DC
  Administered 2017-11-15: 40 mg via SUBCUTANEOUS
  Filled 2017-11-14: qty 0.4

## 2017-11-14 NOTE — ED Notes (Signed)
Report given to Morris on 6E

## 2017-11-14 NOTE — ED Triage Notes (Addendum)
Pt c/o increased gas, bloating, abd pain, N/V/D. Pt has colostomy and states that there has been no output into bag. Upper quadrants on abd seem distended upon assessment. A/Ox4.

## 2017-11-14 NOTE — H&P (Signed)
History and Physical  Daniel Howard ZGY:[id number] DOB: 06-16-1956 DOA: 11/14/2017  Referring physician: EDP PCP: Alycia Rossetti, MD   Chief Complaint: Abdominal pain, decreased ostomy output, vomiting  HPI: Daniel Howard is a 61 y.o. male   History of metastatic prostate cancer status post chemo, and under  androgen deprivation therapy, history of Crohn's disease, status post colostomy, surgery 31 years ago at [facility].  He presented with increased abdominal pain, decreased ostomy output, vomiting started 2 weeks ago.  He has been seen by his GI doctor, however symptoms did not improve, he presented to the ER.  He denies of fever.  No chest pain , no short of breath.  ED course: Vital signs are stable, sick lab work showed WBC 11.7, sodium 130, creatinine 1.35.  CT ab/pel:  1. Findings on today's examination suggests at least partial small bowel obstruction, presumably related to a focus of active Crohn's disease in the distal small bowel shortly before the right lower quadrant ostomy, as discussed above. 2. 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 As the small bowel proximal to this is nondilated, this is unlikely to represent a source of bowel obstruction at this time. 3. Chronic reactive ileocolic lymphadenopathy similar to the prior examination. 4. Large right-sided prostate mass invading the right pelvic sidewall and involving the right seminal vesicle similar to prior examinations. 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 5. Mild pancreatic ductal dilatation (5 mm) is of uncertain etiology and significance, and only slightly more prominent than the prior study. No obstructing pancreatic mass identified.  He received 500 cc IV fluids bolus in the ER, PRN iv morphine and Zofran.  EDP contact GI Dr. Penelope Coop, who recommended start IV Solu-Medrol, admit to  hospitalist service.   Review of Systems:  Detail per HPI, Review of systems are otherwise negative  Past Medical History:  Diagnosis Date  . Abnormal finding of biliary tract    MRCP shows pancreatic/biliary tract dilation. EUS 2010 confirmed dilation but no chronic pancreaitis or mass. Vascular ectasia crimpoing distal CBD.   [name] Anxiety   . 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 Duodenal ulcer 2010   nsaids  . Low back pain   . Peristomal hernia   . Small bowel obstruction ([place])   . Spigelian hernia    bilateral   Past Surgical History:  Procedure Laterality Date  . APPENDECTOMY  1992   at time of exp laparotomy at which time he was noted to have fistulizing Crohn's rather than UC  . COLONOSCOPY [name] 08/23/2014   RFF:[id number] proctoscopy with possible fistulous opening in thebase of rectal/anal stump.    . COLOSTOMY  1992   transverse loop colostomy secondary to a stricture  . ESOPHAGOGASTRODUODENOSCOPY  05/2009   SLF: multiple antral erosions, large ulcer at ansatomosis (postsurgical changes at duodenal bulb and second portion of duodenum) BX c/x NSAIDS.  . EUS  10/04/2009   Dr. Estill Bakes dilated CBD and main pancreatic duct.  No pancreatic  . FLEXIBLE SIGMOIDOSCOPY  1988   Dr. Laural Golden- suggested rohn's disease but the biopsies were not  collaborative.  [name] FLEXIBLE SIGMOIDOSCOPY [name] 09/08/2016   Procedure: FLEXIBLE SIGMOIDOSCOPY;  Surgeon: Wonda Horner, MD;  Location: [name] ENDOSCOPY;  Service: Gastroenterology;  Laterality: [name];  . HEMICOLOECTOMY W/ ANASTOMOSIS  1993   R- Dr.DeMason   . HERNIA REPAIR  1996   incarcerated periostial hernia  with additional surgery in 1999  . IR FLUORO GUIDE PORT INSERTION RIGHT  04/03/2017  . IR US GUIDE VASC ACCESS RIGHT  04/03/2017   Social History:  reports that he quit smoking about 7 years ago. His smoking use included cigarettes. He has a 1.00 pack-year smoking history. he has never used smokeless tobacco. He reports that he does not drink alcohol or use drugs. Patient lives at home & is able to participate in activities of daily living independently   Allergies  Allergen Reactions  . Penicillins Hives    Has patient had a PCN reaction causing immediate rash, facial/tongue/throat swelling, SOB or lightheadedness with hypotension: Yes Has patient had a PCN reaction causing severe rash involving mucus membranes or skin necrosis: No Has patient had a PCN reaction that required hospitalization: No Has patient had a PCN reaction occurring within the last 10 years: No If all of the above answers are "NO", then may proceed with Cephalosporin use.     Family History  Problem Relation Age of Onset  . Cancer Father        prostate   . Prostate cancer Father   . Colon cancer Father 46  . Hypertension Sister   . Cancer Sister   . Depression Sister   . Breast cancer Sister   . COPD Sister   . Aneurysm Brother        deceased, brain aneurysm      Prior to Admission medications   Medication Sig Start Date End Date Taking? Authorizing Provider  Adalimumab (HUMIRA PEN) 40 MG/0.8ML PNKT Inject 40 mg into the skin every 14 (fourteen) days. 12/19/16  Yes Annitta Needs, NP  budesonide (ENTOCORT EC) 3 MG 24 hr capsule Take 3 capsules (9 mg total) by mouth daily. 11/10/17  Yes Carlis Stable, NP  calcium carbonate (OS-CAL) 600 MG TABS tablet Take 1,200 mg by mouth daily with breakfast.   Yes [provider]  dexlansoprazole (DEXILANT) 60 MG capsule Take 60 mg by mouth daily.   Yes [provider]  Ferrous Sulfate 28 MG TABS Take 1 tablet by mouth daily.   Yes [provider]  HYDROcodone-acetaminophen (NORCO/VICODIN) 5-325 MG tablet Take 1 tablet by mouth every 6 (six) hours as needed for moderate pain. 09/11/17  Yes [place], [name], MD  lidocaine-prilocaine (EMLA) cream Apply 1 application topically as needed (when accessing port).   Yes [provider]  mirtazapine (REMERON) 15 MG tablet TAKE 1 TABLET BY MOUTH AT BEDTIME 10/14/17  Yes [place], [name], MD  Multiple Vitamins-Minerals (CENTRUM SILVER ADULT 50+) TABS Take 1 tablet by mouth daily.   Yes [provider]  ondansetron (ZOFRAN) 4 MG tablet Take 1 tablet (4 mg total) by mouth every 8 (eight) hours as needed for nausea or vomiting. 11/10/17  Yes Carlis Stable, NP  Simethicone (GAS-X PO) Take by mouth as needed.   Yes [provider]  mirtazapine (REMERON) 15 MG tablet Take 1 tablet (15 mg total) by mouth at bedtime. Patient not taking: Reported on 11/14/2017 09/11/17   Alycia Rossetti, MD    Physical Exam: BP 121/84 (BP Location: Right Arm)   Pulse 80  Temp 99.4 F (37.4 C) (Oral)   Resp 16   Ht 5' 4.5" (1.638 m)   Wt 58.5 kg (129 lb)   SpO2 94%   BMI 21.80 kg/m   General:  Thin, malnourished, NAD Eyes: PERRL ENT: unremarkable Neck: supple, no JVD Cardiovascular: RRR Respiratory: CTABL Abdomen: + colostomy, mildly distended abdomen, + bs, multiple past surgical scar, left sided abdominal wall hernia ( report chronic)  Skin: no rash Musculoskeletal:  No edema Psychiatric: calm/cooperative Neurologic: no focal findings            Labs on Admission:  Basic Metabolic Panel: Recent Labs  Lab 11/14/17 1241  NA 130*  K 4.3  CL 86*  CO2 31  GLUCOSE 99  BUN 20  CREATININE 1.35*  CALCIUM 9.0   Liver Function Tests: Recent Labs  Lab 11/14/17 1241  AST 22  ALT 15*  ALKPHOS 77  BILITOT 1.5*  PROT 8.5*  ALBUMIN 3.3*   Recent Labs  Lab 11/14/17 1241  LIPASE 24   No results for input(s): AMMONIA in the last 168 hours. CBC: Recent Labs  Lab  11/14/17 1240  WBC 11.7*  NEUTROABS 8.3*  HGB 12.2*  HCT 36.3*  MCV 88.3  PLT 458*   Cardiac Enzymes: No results for input(s): CKTOTAL, CKMB, CKMBINDEX, TROPONINI in the last 168 hours.  BNP (last 3 results) No results for input(s): BNP in the last 8760 hours.  ProBNP (last 3 results) No results for input(s): PROBNP in the last 8760 hours.  CBG: No results for input(s): GLUCAP in the last 168 hours.  Radiological Exams on Admission: Ct Abdomen Pelvis W Contrast  Result Date: 11/14/2017 CLINICAL DATA:  61 year old male with history of increased gas and bloating with abdominal pain, nausea, vomiting and diarrhea. No output in colostomy bag. History of Crohn's disease. Additional history of metastatic prostate cancer. EXAM: CT ABDOMEN AND PELVIS WITH CONTRAST TECHNIQUE: Multidetector CT imaging of the abdomen and pelvis was performed using the standard protocol following bolus administration of intravenous contrast. CONTRAST:  12m ISOVUE-300 IOPAMIDOL (ISOVUE-300) INJECTION 61%, 352mISOVUE-300 IOPAMIDOL (ISOVUE-300) INJECTION 61% COMPARISON:  CT of the abdomen and pelvis 09/22/2017. FINDINGS: Lower chest: Mild scarring or subsegmental atelectasis in the lower lungs bilaterally. Hepatobiliary: No suspicious-appearing cystic or solid hepatic lesions. No intra or extrahepatic biliary ductal dilatation. Status post cholecystectomy. Pancreas: No pancreatic mass. Mild diffuse pancreatic ductal dilatation (5 mm in diameter). No pancreatic or peripancreatic fluid collections or inflammatory changes. Spleen: Unremarkable. Adrenals/Urinary Tract: Multiple subcentimeter low-attenuation lesions in both kidneys, too small to characterize, but statistically likely to represent tiny cysts. Bilateral adrenal glands are normal in appearance. No hydroureteronephrosis. Tiny calcifications lying dependently in the urinary bladder on the left side adjacent to the left ureterovesicular junction, likely represent  tiny stones. Urinary bladder is otherwise unremarkable in appearance. Stomach/Bowel: Stomach is mildly distended, but otherwise unremarkable in appearance. Compared to the prior examination there has been interval development of severe dilatation of the small bowel which measures up to 6.8 cm in diameter in the right-sided the abdomen (axial image 32 of series 2). 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 There is some dilatation of a loop of small bowel within this large left-sided Spigelian hernia which measures up to 3.9 cm in diameter. More distal loops of small bowel in the central abdomen and right side of the abdomen are more  dramatically dilated up to 6.8 cm in diameter. Shortly before the right lower quadrant ostomy there is some focal narrowing of the distal small bowel where there appears to be some mucosal hyperenhancement, best appreciated on axial images 43-45 of series 2. This appearance is suggestive for a focus of active Crohn's disease. Vascular/Lymphatic: No significant atherosclerotic disease, aneurysm or dissection noted in the abdominal or pelvic vasculature. Retroaortic left renal vein (normal anatomical variant) incidentally noted. Numerous borderline enlarged and enlarged ileocolic lymph nodes are again noted, measuring up to 15 mm in short axis on today's examination (axial image 46 of series 2), likely chronic and reactive. Reproductive: Large mass involving the prostate gland extending into the right side of the pelvis involving the right levator ani musculature, right pelvic sidewall and right seminal vesicle overall appears similar to the prior study, estimated to measure approximately 6.7 x 5.1 cm on today's examination (axial image 73 of series 2). Other: No significant volume of ascites.  No pneumoperitoneum. Musculoskeletal: Multiple sclerotic lesions are  again noted throughout the bony pelvis, compatible with widespread metastatic disease to the bones, with the largest lesions involving the intertrochanteric region of the right femur and the right ischium. IMPRESSION: 1. Findings on today's examination suggests at least partial small bowel obstruction, presumably related to a focus of active Crohn's disease in the distal small bowel shortly before the right lower quadrant ostomy, as discussed above. 2. 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 As the small bowel proximal to this is nondilated, this is unlikely to represent a source of bowel obstruction at this time. 3. Chronic reactive ileocolic lymphadenopathy similar to the prior examination. 4. Large right-sided prostate mass invading the right pelvic sidewall and involving the right seminal vesicle similar to prior examinations. 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 5. Mild pancreatic ductal dilatation (5 mm) is of uncertain etiology and significance, and only slightly more prominent than the prior study. No obstructing pancreatic mass identified. 6. Additional incidental findings, as above. Electronically Signed   By: Vinnie Langton M.D.   On: 11/14/2017 16:50     Assessment/Plan Present on Admission: **None**  partial small bowel obstruction, presumably related to a focus of active Crohn's disease in the distal small bowel shortly before the right lower quadrant ostomy,  Npo, ivf , PRN analgesic, antiemetics, no acute abdomen on exam, no active vomiting, GI is ok without ng for now IV Solu-Medrol GI consulted  Mild elevated leukocytosis/ hyponatremia/AKI Likely from dehydration, no fever non-toxic-appearing, UA unremarkable Continue hydration, renal dosing meds  Metastatic prostate cancer, deferred to oncology.  Malnutrition, will need nutrition consult once able to take oral  DVT prophylaxis:  lovenox  Consultants: GI Dr Penelope Coop  Code Status: full   Family Communication:  Patient and wife  Disposition Plan: admit to med surg  Time spent: 3mns  FFlorencia ReasonsMD, PhD [facility] Pager [phone] 7PM-7AM, please contact night-coverage at [email or web address], password [facility]

## 2017-11-14 NOTE — ED Notes (Signed)
My plan is to wait for the admit/gastroenterology doctor(s) to see pt. Before insertion of N.G. Tube. Alex, our P.A. Is aware of; and agrees with this plan.

## 2017-11-14 NOTE — ED Provider Notes (Signed)
Sign out from [facility], PA-C at shift change Please see previous provider's note for full H&P  Briefly, hx of Crohns and large left signed hernia. 3 days nausea, dyspepsia, distension, decreased out put to ileostomy bag, reports fecalemesis  Plan: CTAP pending, consult GI if normal. If abnormal admit.  CTAP shows: 1. Findings on today's examination suggests at least partial small bowel obstruction, presumably related to a focus of active Crohn's disease in the distal small bowel shortly before the right lower quadrant ostomy, as discussed above. 2. 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 As the small bowel proximal to this is nondilated, this is unlikely to represent a source of bowel obstruction at this time. 3. Chronic reactive ileocolic lymphadenopathy similar to the prior examination. 4. Large right-sided prostate mass invading the right pelvic sidewall and involving the right seminal vesicle similar to prior examinations. 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 5. Mild pancreatic ductal dilatation (5 mm) is of uncertain etiology and significance, and only slightly more prominent than the prior study. No obstructing pancreatic mass identified.  Patient is aware of prostate mass bone metastases as well as large spigelian hernia.  He is followed by oncology.  Patient does have a new partial small bowel obstruction presumably from an Crohn's disease focus.  I consulted gastroenterology and spoke with Dr. Penelope Coop who recommends medicine admit, IV Solu-Medrol 40 mg every 8 hours +/- NG tube. Dr. Penelope Coop will see the patient tomorrow.  He does not recommend general surgery consult at this time.  I consulted [facility] and spoke with Dr. Erlinda Hong who will admit the patient for further evaluation and management. Appreciate consults.  Patient understands and agrees with plan.   Frederica Kuster,  PA-C 11/14/17 [place], [place], MD 11/15/17 [phone]

## 2017-11-14 NOTE — ED Provider Notes (Signed)
[facility] Provider Note   CSN: [id number] Arrival date & time: 11/14/17  1221     History   Chief Complaint No chief complaint on file.   HPI Daniel Howard is a 61 y.o. male with history of Crohn's disease status post right hemicolectomy with ileostomy, peri-stomal hernia, SBO, spigelian hernia, pancreatic and biliary tract dilation, and metastatic prostate cancer with bony metastases (completed chemo in August) presents today with chief complaint gradual onset, progressively worsening abdominal pain and distention for 3 days.  He states he has been having the symptoms for some time now but they acutely worsened 3 days ago.  He endorses excessive belching, nausea, multiple episodes of nonbloody nonbilious emesis which he states "is the same color as what's in my ileostomy bag".  Endorses decreased stool output to his ostomy bag, and has had no stool production in the past 48 hours.  States he has been able to keep some food down and does not always vomit after meals.  Denies fevers, chills, chest pain, or shortness of breath.  Has tried Dexilant and Gas-X without relief of his symptoms.  Denies urinary symptoms.  Most recent GI visit 11/10/17, with similar symptoms.  Outpatient CT scan was ordered at that time. Chart review shows he has a sequestered left colon that cannot be adequately surveilled via scope due to rectal stricture.  He has multiple abdominal wall hernias.  Current treatment for prostate cancer includes androgen deprivation therapy.  The history is provided by the patient.    Past Medical History:  Diagnosis Date  . Abnormal finding of biliary tract    MRCP shows pancreatic/biliary tract dilation. EUS 2010 confirmed dilation but no chronic pancreaitis or mass. Vascular ectasia crimpoing distal CBD.   [name] Anxiety   . Cancer ([place])   . Crohn's 1982   initially treated for UC first 9-10 years but at time of exploratory laparotomy with  incidental appendectomy in 1992 he was noted to have multiple fistulas involving rectosigmoid colon with sigmoid stricture.s/p transverse loop colostomy secondary to stricture 1992., followed by end-transverse ostomy, followed by right hemicolectomy, followed  by takedown & ileostomy  . Duodenal ulcer 2010   nsaids  . Low back pain   . Peristomal hernia   . Small bowel obstruction ([place])   . Spigelian hernia    bilateral    Patient Active Problem List   Diagnosis Date Noted  . Nausea with vomiting 11/10/2017  . Spigelian hernia 09/22/2017  . Abdominal pain of multiple sites 09/22/2017  . Goals of care, counseling/discussion 04/02/2017  . Prostate cancer ([place]) 04/02/2017  . Elevated PSA 03/04/2017  . Osteopenia 11/03/2016  . Pelvic mass in male 09/04/2016  . 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 Exacerbation of Crohn's disease ([place]) 09/04/2016  . Trigger finger, acquired 10/31/2015  . Protein-calorie malnutrition, severe ([place]) 07/27/2014  . SBO (small bowel obstruction) ([place]) 07/26/2014  . Loss of weight 06/09/2014  . Hypotension, unspecified 06/09/2014  . Crohn's disease of both small and large intestine with complication ([place]) 96/75/9163  . Boils 02/11/2013  . Ventral hernia 12/11/2009  . Anemia 12/03/2009  . Regional enteritis ([place]) 01/25/2007  . LOW BACK PAIN 01/25/2007    Past Surgical History:  Procedure Laterality Date  . APPENDECTOMY  1992   at time of exp laparotomy at which time he was noted to have fistulizing Crohn's rather than UC  . COLONOSCOPY [name] 08/23/2014   [id number] proctoscopy with possible fistulous opening in thebase of  rectal/anal stump.    . COLOSTOMY  1992   transverse loop colostomy secondary to a stricture  . ESOPHAGOGASTRODUODENOSCOPY  05/2009   SLF: multiple antral erosions, large ulcer at ansatomosis (postsurgical changes at duodenal bulb and second portion of duodenum) BX c/x NSAIDS.  . EUS  10/04/2009   Dr. Estill Bakes dilated CBD and main  pancreatic duct.  No pancreatic  . FLEXIBLE SIGMOIDOSCOPY  1988   Dr. Laural Golden- suggested rohn's disease but the biopsies were not collaborative.  [name] FLEXIBLE SIGMOIDOSCOPY [name] 09/08/2016   Procedure: FLEXIBLE SIGMOIDOSCOPY;  Surgeon: Wonda Horner, MD;  Location[facility];  Service: Gastroenterology;  Laterality: [name];  . HEMICOLOECTOMY W/ ANASTOMOSIS  1993   R- Dr.DeMason   . HERNIA REPAIR  1996   incarcerated periostial hernia with additional surgery in 1999  . IR FLUORO GUIDE PORT INSERTION RIGHT  04/03/2017  . IR US GUIDE VASC ACCESS RIGHT  04/03/2017       Home Medications    Prior to Admission medications   Medication Sig Start Date End Date Taking? Authorizing Provider  Adalimumab (HUMIRA PEN) 40 MG/0.8ML PNKT Inject 40 mg into the skin every 14 (fourteen) days. 12/19/16  Yes Annitta Needs, NP  budesonide (ENTOCORT EC) 3 MG 24 hr capsule Take 3 capsules (9 mg total) by mouth daily. 11/10/17  Yes Carlis Stable, NP  calcium carbonate (OS-CAL) 600 MG TABS tablet Take 1,200 mg by mouth daily with breakfast.   Yes [provider]  dexlansoprazole (DEXILANT) 60 MG capsule Take 60 mg by mouth daily.   Yes [provider]  Ferrous Sulfate 28 MG TABS Take 1 tablet by mouth daily.   Yes [provider]  HYDROcodone-acetaminophen (NORCO/VICODIN) 5-325 MG tablet Take 1 tablet by mouth every 6 (six) hours as needed for moderate pain. 09/11/17  Yes [place], [name], MD  lidocaine-prilocaine (EMLA) cream Apply 1 application topically as needed (when accessing port).   Yes [provider]  mirtazapine (REMERON) 15 MG tablet TAKE 1 TABLET BY MOUTH AT BEDTIME 10/14/17  Yes [place], [name], MD  Multiple Vitamins-Minerals (CENTRUM SILVER ADULT 50+) TABS Take 1 tablet by mouth daily.   Yes [provider]  ondansetron (ZOFRAN) 4 MG tablet Take 1 tablet (4 mg total) by mouth every 8 (eight) hours as needed for nausea or vomiting. 11/10/17  Yes Carlis Stable,  NP  Simethicone (GAS-X PO) Take by mouth as needed.   Yes [provider]  mirtazapine (REMERON) 15 MG tablet Take 1 tablet (15 mg total) by mouth at bedtime. Patient not taking: Reported on 11/14/2017 09/11/17   Alycia Rossetti, MD    Family History Family History  Problem Relation Age of Onset  . Cancer Father        prostate   . Prostate cancer Father   . Colon cancer Father 24  . Hypertension Sister   . Cancer Sister   . Depression Sister   . Breast cancer Sister   . COPD Sister   . Aneurysm Brother        deceased, brain aneurysm    Social History Social History   Tobacco Use  . Smoking status: Former Smoker    Packs/day: 0.50    Years: 2.00    Pack years: 1.00    Types: Cigarettes    Last attempt to quit: 12/21/2009    Years since quitting: 7.9  . Smokeless tobacco: Never Used  . Tobacco comment: Quit abut 20 years  Substance Use Topics  . Alcohol use: No    Alcohol/week: 0.0 oz    Comment: Former drinker  . Drug use: No     Allergies   Penicillins   Review of Systems Review of Systems  Constitutional: Negative for chills and fever.  Respiratory: Negative for shortness of breath.   Cardiovascular: Negative for chest pain.  Gastrointestinal: Positive for abdominal pain, nausea and vomiting. Negative for blood in stool.  Genitourinary: Negative for dysuria, hematuria and urgency.  All other systems reviewed and are negative.    Physical Exam Updated Vital Signs BP 104/66 (BP Location: Left Arm)   Pulse 82   Temp 98.2 F (36.8 C) (Oral)   Resp 17   Ht 5' 4.5" (1.638 m)   Wt 58.5 kg (129 lb)   SpO2 99%   BMI 21.80 kg/m   Physical Exam  Constitutional: He appears well-developed and well-nourished. No distress.  HENT:  Head: Normocephalic and atraumatic.  Eyes: Conjunctivae are normal. Right eye exhibits no discharge. Left eye exhibits no discharge.  Neck: Normal range of motion. Neck supple. No JVD present. No tracheal deviation  present.  Cardiovascular: Normal rate, regular rhythm, normal heart sounds and intact distal pulses.  Pulmonary/Chest: Effort normal and breath sounds normal.  Abdominal: Bowel sounds are normal. He exhibits no distension. There is no guarding. A hernia is present.  Large left-sided somewhat reducible hernia.  Generalized tenderness to palpation of the abdomen.  Ileostomy bag in the right lower quadrant with very little stool production.  No surrounding erythema, induration, or fluctuance.  Multiple healed surgical incisions to the abdomen  Musculoskeletal: Normal range of motion. He exhibits no edema.  Neurological: He is alert.  Skin: Skin is warm and dry. No erythema.  Psychiatric: He has a normal mood and affect. His behavior is normal.  Nursing note and vitals reviewed.    ED Treatments / Results  Labs (all labs ordered are listed, but only abnormal results are displayed) Labs Reviewed  COMPREHENSIVE METABOLIC PANEL - Abnormal; Notable for the following components:      Result Value   Sodium 130 (*)    Chloride 86 (*)    Creatinine, Ser 1.35 (*)    Total Protein 8.5 (*)    Albumin 3.3 (*)    ALT 15 (*)    Total Bilirubin 1.5 (*)    GFR calc non Af Amer 56 (*)    All other components within normal limits  URINALYSIS, ROUTINE W REFLEX MICROSCOPIC - Abnormal; Notable for the following components:   Ketones, ur 80 (*)    All other components within normal limits  CBC WITH DIFFERENTIAL/PLATELET - Abnormal; Notable for the following components:   WBC 11.7 (*)    RBC 4.11 (*)    Hemoglobin 12.2 (*)    HCT 36.3 (*)    Platelets 458 (*)    Neutro Abs 8.3 (*)    Monocytes Absolute 1.1 (*)    All other components within normal limits  CBC - Abnormal; Notable for the following components:   WBC 12.5 (*)    RBC 3.81 (*)    Hemoglobin 11.1 (*)    HCT 34.0 (*)    All other components within normal limits  COMPREHENSIVE METABOLIC PANEL - Abnormal; Notable for the following  components:   Sodium 133 (*)    Chloride 92 (*)    Glucose, Bld 114 (*)    Creatinine, Ser 1.28 (*)    Albumin 3.0 (*)  ALT 15 (*)    Total Bilirubin 1.7 (*)    GFR calc non Af Amer 60 (*)    All other components within normal limits  LIPASE, BLOOD  HIV ANTIBODY (ROUTINE TESTING)  SEDIMENTATION RATE  C-REACTIVE PROTEIN    EKG  EKG Interpretation None       Radiology Ct Abdomen Pelvis W Contrast  Result Date: 11/14/2017 CLINICAL DATA:  61 year old male with history of increased gas and bloating with abdominal pain, nausea, vomiting and diarrhea. No output in colostomy bag. History of Crohn's disease. Additional history of metastatic prostate cancer. EXAM: CT ABDOMEN AND PELVIS WITH CONTRAST TECHNIQUE: Multidetector CT imaging of the abdomen and pelvis was performed using the standard protocol following bolus administration of intravenous contrast. CONTRAST:  176m ISOVUE-300 IOPAMIDOL (ISOVUE-300) INJECTION 61%, 338mISOVUE-300 IOPAMIDOL (ISOVUE-300) INJECTION 61% COMPARISON:  CT of the abdomen and pelvis 09/22/2017. FINDINGS: Lower chest: Mild scarring or subsegmental atelectasis in the lower lungs bilaterally. Hepatobiliary: No suspicious-appearing cystic or solid hepatic lesions. No intra or extrahepatic biliary ductal dilatation. Status post cholecystectomy. Pancreas: No pancreatic mass. Mild diffuse pancreatic ductal dilatation (5 mm in diameter). No pancreatic or peripancreatic fluid collections or inflammatory changes. Spleen: Unremarkable. Adrenals/Urinary Tract: Multiple subcentimeter low-attenuation lesions in both kidneys, too small to characterize, but statistically likely to represent tiny cysts. Bilateral adrenal glands are normal in appearance. No hydroureteronephrosis. Tiny calcifications lying dependently in the urinary bladder on the left side adjacent to the left ureterovesicular junction, likely represent tiny stones. Urinary bladder is otherwise unremarkable in  appearance. Stomach/Bowel: Stomach is mildly distended, but otherwise unremarkable in appearance. Compared to the prior examination there has been interval development of severe dilatation of the small bowel which measures up to 6.8 cm in diameter in the right-sided the abdomen (axial image 32 of series 2). 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 There is some dilatation of a loop of small bowel within this large left-sided Spigelian hernia which measures up to 3.9 cm in diameter. More distal loops of small bowel in the central abdomen and right side of the abdomen are more dramatically dilated up to 6.8 cm in diameter. Shortly before the right lower quadrant ostomy there is some focal narrowing of the distal small bowel where there appears to be some mucosal hyperenhancement, best appreciated on axial images 43-45 of series 2. This appearance is suggestive for a focus of active Crohn's disease. Vascular/Lymphatic: No significant atherosclerotic disease, aneurysm or dissection noted in the abdominal or pelvic vasculature. Retroaortic left renal vein (normal anatomical variant) incidentally noted. Numerous borderline enlarged and enlarged ileocolic lymph nodes are again noted, measuring up to 15 mm in short axis on today's examination (axial image 46 of series 2), likely chronic and reactive. Reproductive: Large mass involving the prostate gland extending into the right side of the pelvis involving the right levator ani musculature, right pelvic sidewall and right seminal vesicle overall appears similar to the prior study, estimated to measure approximately 6.7 x 5.1 cm on today's examination (axial image 73 of series 2). Other: No significant volume of ascites.  No pneumoperitoneum. Musculoskeletal: Multiple sclerotic lesions are again noted throughout the bony pelvis, compatible with  widespread metastatic disease to the bones, with the largest lesions involving the intertrochanteric region of the right femur and the right ischium. IMPRESSION: 1. Findings on today's examination suggests at least partial small bowel  obstruction, presumably related to a focus of active Crohn's disease in the distal small bowel shortly before the right lower quadrant ostomy, as discussed above. 2. 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 As the small bowel proximal to this is nondilated, this is unlikely to represent a source of bowel obstruction at this time. 3. Chronic reactive ileocolic lymphadenopathy similar to the prior examination. 4. Large right-sided prostate mass invading the right pelvic sidewall and involving the right seminal vesicle similar to prior examinations. 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 5. Mild pancreatic ductal dilatation (5 mm) is of uncertain etiology and significance, and only slightly more prominent than the prior study. No obstructing pancreatic mass identified. 6. Additional incidental findings, as above. Electronically Signed   By: Vinnie Langton M.D.   On: 11/14/2017 16:50    Procedures Procedures (including critical care time)  Medications Ordered in ED Medications  iopamidol (ISOVUE-300) 61 % injection (not administered)  iopamidol (ISOVUE-300) 61 % injection (not administered)  lidocaine-prilocaine (EMLA) cream 1 application (not administered)  0.9 %  sodium chloride infusion ( Intravenous New Bag/Given 11/14/17 2350)  methylPREDNISolone sodium succinate (SOLU-MEDROL) 125 mg/2 mL injection 60 mg (60 mg Intravenous Given 11/15/17 0224)  ondansetron (ZOFRAN) injection 4 mg (not administered)  enoxaparin (LOVENOX) injection 40 mg (40 mg Subcutaneous Given 11/15/17 0013)  morphine 4 MG/ML injection 2 mg (not administered)  sodium chloride 0.9 % bolus 500 mL (0 mLs Intravenous  Stopped 11/14/17 1635)  ondansetron (ZOFRAN) injection 4 mg (4 mg Intravenous Given 11/14/17 1412)  morphine 2 MG/ML injection 2 mg (2 mg Intravenous Given 11/14/17 1412)  iopamidol (ISOVUE-300) 61 % injection 100 mL (100 mLs Intravenous Contrast Given 11/14/17 1604)  iopamidol (ISOVUE-300) 61 % injection 30 mL (30 mLs Oral Contrast Given 11/14/17 1415)  morphine 2 MG/ML injection 2 mg (2 mg Intravenous Given 11/14/17 1653)  HYDROmorphone (DILAUDID) injection 0.5 mg (0.5 mg Intravenous Given 11/15/17 0015)     Initial Impression / Assessment and Plan / ED Course  I have reviewed the triage vital signs and the nursing notes.  Pertinent labs & imaging results that were available during my care of the patient were reviewed by me and considered in my medical decision making (see chart for details).   Patient with complicated surgical abdominal history presents with dyspepsia, belching, and reduced stool output in his ileostomy.  Afebrile, vital signs are stable.  Concern for fecal emesis given description of color of emesis matching the color of stool in his ileostomy bag.  Lab work shows slight leukocytosis with increased absolute neutrophils and monocytes. CMET shows slight hyponatremia and hyperbilirubinema as well as slightly elevated creatinine.  Will obtain CT scan of the abdomen to evaluate for small bowel obstruction versus other acute abdominal pathology.   4:30PM Signed out to oncoming provider PA Law.  Awaiting CT scan.  If evidence of small bowel obstruction or acute surgical abdominal pathology, recommend consult to GI and likely admission to the hospital.  If no acute abnormalities, likely stable for discharge home.  Final Clinical Impressions(s) / ED Diagnoses   Final diagnoses:  SBO (small bowel obstruction) [facility])    ED Discharge Orders    None       Debroah Baller 11/15/17 [place], MD 11/15/17 [phone]

## 2017-11-15 ENCOUNTER — Other Ambulatory Visit: Payer: Self-pay

## 2017-11-15 DIAGNOSIS — K50912 Crohn's disease, unspecified, with intestinal obstruction: Secondary | ICD-10-CM

## 2017-11-15 LAB — COMPREHENSIVE METABOLIC PANEL
ALK PHOS: 70 U/L (ref 38–126)
ALT: 15 U/L — AB (ref 17–63)
AST: 20 U/L (ref 15–41)
Albumin: 3 g/dL — ABNORMAL LOW (ref 3.5–5.0)
Anion gap: 12 (ref 5–15)
BUN: 20 mg/dL (ref 6–20)
CALCIUM: 8.9 mg/dL (ref 8.9–10.3)
CO2: 29 mmol/L (ref 22–32)
CREATININE: 1.28 mg/dL — AB (ref 0.61–1.24)
Chloride: 92 mmol/L — ABNORMAL LOW (ref 101–111)
GFR calc non Af Amer: 60 mL/min — ABNORMAL LOW (ref >60)
GLUCOSE: 114 mg/dL — AB (ref 65–99)
Potassium: 5.1 mmol/L (ref 3.5–5.1)
SODIUM: 133 mmol/L — AB (ref 135–145)
Total Bilirubin: 1.7 mg/dL — ABNORMAL HIGH (ref 0.3–1.2)
Total Protein: 7.5 g/dL (ref 6.5–8.1)

## 2017-11-15 LAB — C-REACTIVE PROTEIN: CRP: 6.7 mg/dL — AB (ref <1.0)

## 2017-11-15 LAB — CBC
HCT: 34 % — ABNORMAL LOW (ref 39.0–52.0)
HEMOGLOBIN: 11.1 g/dL — AB (ref 13.0–17.0)
MCH: 29.1 pg (ref 26.0–34.0)
MCHC: 32.6 g/dL (ref 30.0–36.0)
MCV: 89.2 fL (ref 78.0–100.0)
Platelets: 398 10*3/uL (ref 150–400)
RBC: 3.81 MIL/uL — AB (ref 4.22–5.81)
RDW: 14.5 % (ref 11.5–15.5)
WBC: 12.5 10*3/uL — AB (ref 4.0–10.5)

## 2017-11-15 LAB — SEDIMENTATION RATE: Sed Rate: 87 mm/hr — ABNORMAL HIGH (ref 0–16)

## 2017-11-15 MED ORDER — PANTOPRAZOLE SODIUM 20 MG PO TBEC
20.0000 mg | DELAYED_RELEASE_TABLET | Freq: Every day | ORAL | Status: DC
  Administered 2017-11-15 – 2017-11-16 (×2): 20 mg via ORAL
  Filled 2017-11-15 (×2): qty 1

## 2017-11-15 MED ORDER — SODIUM CHLORIDE 0.9 % IV SOLN
INTRAVENOUS | Status: DC
  Administered 2017-11-16: 01:00:00 via INTRAVENOUS

## 2017-11-15 MED ORDER — SODIUM CHLORIDE 0.9 % IV BOLUS (SEPSIS)
1000.0000 mL | Freq: Once | INTRAVENOUS | Status: AC
  Administered 2017-11-15: 1000 mL via INTRAVENOUS

## 2017-11-15 NOTE — Consult Note (Signed)
[place] Nurse ostomy consult note Stoma type/location: RLQ prolapsed stoma. Nurse asked me to assess as pouch was leaking.  Patient had large violent output and pouch came off.  Stomal assessment/size: 1 3/8" pink patent prolapsed stoma.  Producing soft brown effluent at this time.  Peristomal assessment: intact  Hernia noted Treatment options for stomal/peristomal skin: barrier ring and 1 piece convex pouch Output soft brown stool Ostomy pouching: 1pc.convex with barrier ring Education provided:  Patient is independent with care.  Assistance provided to use our products.  He states his wife may bring his supplies from home.  Enrolled patient in [place] program: No Will not follow at this time.  Please re-consult if needed.  Domenic Moras RN BSN [place] Pager [phone]

## 2017-11-15 NOTE — Consult Note (Signed)
Subjective:   HPI  The patient is a 61 year old male with a history of Crohn's disease. He has an ostomy. Yesterday he came in to the hospital with complaints of abdominal pain and his ostomy was not putting anything out. CT of the abdomen showed evidence of active Crohn's disease and distal small bowel obstruction. He was admitted to the hospital and started on Solu-Medrol 40 mg IV 3 times a day. Today he feels fine. His abdomen feels good. His ostomy is putting out a good amount today and he is happy with the results. He has been on Humira and Entocort. His gastroenterologist is Dr. Buford Dresser.  Review of Systems No chest pain or shortness of breath  Past Medical History:  Diagnosis Date  . Abnormal finding of biliary tract    MRCP shows pancreatic/biliary tract dilation. EUS 2010 confirmed dilation but no chronic pancreaitis or mass. Vascular ectasia crimpoing distal CBD.   [name] Anxiety   . Cancer ([place])   . 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 Duodenal ulcer 2010   nsaids  . Low back pain   . Peristomal hernia   . Small bowel obstruction ([place])   . Spigelian hernia    bilateral   Past Surgical History:  Procedure Laterality Date  . APPENDECTOMY  1992   at time of exp laparotomy at which time he was noted to have fistulizing Crohn's rather than UC  . COLONOSCOPY [name] 08/23/2014   LNL:GXQJJHE proctoscopy with possible fistulous opening in thebase of rectal/anal stump.    . COLOSTOMY  1992   transverse loop colostomy secondary to a stricture  . ESOPHAGOGASTRODUODENOSCOPY  05/2009   SLF: multiple antral erosions, large ulcer at ansatomosis (postsurgical  changes at duodenal bulb and second portion of duodenum) BX c/x NSAIDS.  . EUS  10/04/2009   Dr. Estill Bakes dilated CBD and main pancreatic duct.  No pancreatic  . FLEXIBLE SIGMOIDOSCOPY  1988   Dr. Laural Golden- suggested rohn's disease but the biopsies were not collaborative.  [name] FLEXIBLE SIGMOIDOSCOPY [name] 09/08/2016   Procedure: FLEXIBLE SIGMOIDOSCOPY;  Surgeon: Wonda Horner, MD;  Location: [facility] ENDOSCOPY;  Service: Gastroenterology;  Laterality: [name];  . HEMICOLOECTOMY W/ ANASTOMOSIS  1993   R- Dr.DeMason   . HERNIA REPAIR  1996   incarcerated periostial hernia with additional surgery in 1999  . IR FLUORO GUIDE PORT INSERTION RIGHT  04/03/2017  . IR US GUIDE VASC ACCESS RIGHT  04/03/2017   Social History   Socioeconomic History  . Marital status: Married    Spouse name: Not on file  . Number of children: 31  . Years of education: Not on file  . Highest education level: Not on file  Social Needs  . Financial resource strain: Not on file  . Food insecurity - worry: Not on file  . Food insecurity - inability: Not on file  . Transportation needs - medical: Not on file  . Transportation needs - non-medical: Not on file  Occupational History  . Occupation: disabled    Fish farm manager: UNEMPLOYED  Tobacco Use  . Smoking status: Former Smoker    Packs/day: 0.50    Years: 2.00    Pack years: 1.00    Types: Cigarettes  Last attempt to quit: 12/21/2009    Years since quitting: 7.9  . Smokeless tobacco: Never Used  . Tobacco comment: Quit abut 20 years  Substance and Sexual Activity  . Alcohol use: No    Alcohol/week: 0.0 oz    Comment: Former drinker  . Drug use: No  . Sexual activity: Yes    Partners: Female  Other Topics Concern  . Not on file  Social History Narrative  . Not on file   family history includes Aneurysm in his brother; Breast cancer in his sister; COPD in his sister; Cancer in his father and sister; Colon cancer (age of onset: 23) in his father; Depression in his  sister; Hypertension in his sister; Prostate cancer in his father.  Current Facility-Administered Medications:  .  0.9 %  sodium chloride infusion, , Intravenous, Continuous, Florencia Reasons, MD, Last Rate: 75 mL/hr at 11/15/17 1135 .  0.9 %  sodium chloride infusion, , Intravenous, Continuous, Florencia Reasons, MD .  enoxaparin (LOVENOX) injection 40 mg, 40 mg, Subcutaneous, QHS, Florencia Reasons, MD, 40 mg at 11/15/17 0013 .  lidocaine-prilocaine (EMLA) cream 1 application, 1 application, Topical, PRN, Florencia Reasons, MD .  methylPREDNISolone sodium succinate (SOLU-MEDROL) 125 mg/2 mL injection 60 mg, 60 mg, Intravenous, Q8H, Florencia Reasons, MD, 60 mg at 11/15/17 1028 .  morphine 4 MG/ML injection 2 mg, 2 mg, Intravenous, Q2H PRN, Florencia Reasons, MD .  ondansetron [facility]) injection 4 mg, 4 mg, Intravenous, Q6H PRN, Florencia Reasons, MD .  pantoprazole (PROTONIX) EC tablet 20 mg, 20 mg, Oral, Daily, Florencia Reasons, MD, 20 mg at 11/15/17 1427 Allergies  Allergen Reactions  . Penicillins Hives    Has patient had a PCN reaction causing immediate rash, facial/tongue/throat swelling, SOB or lightheadedness with hypotension: Yes Has patient had a PCN reaction causing severe rash involving mucus membranes or skin necrosis: No Has patient had a PCN reaction that required hospitalization: No Has patient had a PCN reaction occurring within the last 10 years: No If all of the above answers are "NO", then may proceed with Cephalosporin use.      Objective:     BP 104/66 (BP Location: Left Arm)   Pulse 82   Temp 98.2 F (36.8 C) (Oral)   Resp 17   Ht 5' 4.5" (1.638 m)   Wt 58.5 kg (129 lb)   SpO2 99%   BMI 21.80 kg/m   No distress  Heart regular rhythm no murmurs  Lungs clear  Abdomen soft nontender  Laboratory No components found for: D1    Assessment:     Crohn's disease  Small bowel obstruction secondary to Crohn's disease which appears to be resolving with IV Solu-Medrol      Plan:     Advance to clear liquid diet and  see how he does clinically. If he does well he can probably go home tomorrow on by mouth prednisone and follow-up with his primary gastroenterologist Dr. Buford Dresser to taper the prednisone.

## 2017-11-15 NOTE — Progress Notes (Signed)
Colostomy bag leaked through, changed twice, once by this RN and then by [facility] RN.

## 2017-11-15 NOTE — Progress Notes (Signed)
PROGRESS NOTE  Daniel Howard RFF:[id number] DOB: 1956-10-26 DOA: 11/14/2017 PCP: Alycia Rossetti, MD  HPI/Recap of past 24 hours:  Feeling much better, ab pain has resolved, last dose of dilaudid was midnight colostomy filled with liquid stool this am, no n/v, no fever, he wants to eat  Assessment/Plan: Active Problems:   SBO (small bowel obstruction) (HCC)   partial small bowel obstruction, presumably related to a focus of active Crohn's disease in the distal small bowel shortly before the right lower quadrant ostomy,   GI  Dr Penelope Coop recommended iv solumedrol,  Patient is improving, ab pain resolved , colostomy start to function. Advance diet to clears GI to decide on steroid taper and duration, awaiting formal consult.  Mild elevated leukocytosis/ hyponatremia/AKI Likely from dehydration, no fever non-toxic-appearing, UA unremarkable Continue hydration, renal dosing meds  Metastatic prostate cancer, deferred to oncology.  Malnutrition,  nutrition consult   DVT prophylaxis: lovenox   Code Status: full    Family Communication: patient   Disposition Plan: home in 1-2 days, if able to tolerate diet advancement, need to taper steroids, need GI clearance   Consultants:  GI Dr Penelope Coop  Procedures:  none  Antibiotics:  none   Objective: BP 104/66 (BP Location: Left Arm)   Pulse 82   Temp 98.2 F (36.8 C) (Oral)   Resp 17   Ht 5' 4.5" (1.638 m)   Wt 58.5 kg (129 lb)   SpO2 99%   BMI 21.80 kg/m   Intake/Output Summary (Last 24 hours) at 11/15/2017 0740 Last data filed at 11/15/2017 3570 Gross per 24 hour  Intake 0 ml  Output 475 ml  Net -475 ml   Filed Weights   11/14/17 1228  Weight: 58.5 kg (129 lb)    Exam: Patient is examined daily including today on 11/15/2017, exams remain the same as of yesterday except that has changed    General:  NAD  Cardiovascular: RRR  Respiratory: CTABL  Abdomen:   Colostomy filled with liquids  stool, + bs, nontender, multiple past surgical scar, left sided abdominal wall hernia ( report chronic)   Musculoskeletal: No Edema  Neuro: alert, oriented   Data Reviewed: Basic Metabolic Panel: Recent Labs  Lab 11/14/17 1241 11/15/17 0521  NA 130* 133*  K 4.3 5.1  CL 86* 92*  CO2 31 29  GLUCOSE 99 114*  BUN 20 20  CREATININE 1.35* 1.28*  CALCIUM 9.0 8.9   Liver Function Tests: Recent Labs  Lab 11/14/17 1241 11/15/17 0521  AST 22 20  ALT 15* 15*  ALKPHOS 77 70  BILITOT 1.5* 1.7*  PROT 8.5* 7.5  ALBUMIN 3.3* 3.0*   Recent Labs  Lab 11/14/17 1241  LIPASE 24   No results for input(s): AMMONIA in the last 168 hours. CBC: Recent Labs  Lab 11/14/17 1240 11/15/17 0521  WBC 11.7* 12.5*  NEUTROABS 8.3*  --   HGB 12.2* 11.1*  HCT 36.3* 34.0*  MCV 88.3 89.2  PLT 458* 398   Cardiac Enzymes:   No results for input(s): CKTOTAL, CKMB, CKMBINDEX, TROPONINI in the last 168 hours. BNP (last 3 results) No results for input(s): BNP in the last 8760 hours.  ProBNP (last 3 results) No results for input(s): PROBNP in the last 8760 hours.  CBG: No results for input(s): GLUCAP in the last 168 hours.  No results found for this or any previous visit (from the past 240 hour(s)).   Studies: Ct Abdomen Pelvis W Contrast  Result Date: 11/14/2017  CLINICAL DATA:  61 year old male with history of increased gas and bloating with abdominal pain, nausea, vomiting and diarrhea. No output in colostomy bag. History of Crohn's disease. Additional history of metastatic prostate cancer. EXAM: CT ABDOMEN AND PELVIS WITH CONTRAST TECHNIQUE: Multidetector CT imaging of the abdomen and pelvis was performed using the standard protocol following bolus administration of intravenous contrast. CONTRAST:  15m ISOVUE-300 IOPAMIDOL (ISOVUE-300) INJECTION 61%, 37mISOVUE-300 IOPAMIDOL (ISOVUE-300) INJECTION 61% COMPARISON:  CT of the abdomen and pelvis 09/22/2017. FINDINGS: Lower chest: Mild scarring  or subsegmental atelectasis in the lower lungs bilaterally. Hepatobiliary: No suspicious-appearing cystic or solid hepatic lesions. No intra or extrahepatic biliary ductal dilatation. Status post cholecystectomy. Pancreas: No pancreatic mass. Mild diffuse pancreatic ductal dilatation (5 mm in diameter). No pancreatic or peripancreatic fluid collections or inflammatory changes. Spleen: Unremarkable. Adrenals/Urinary Tract: Multiple subcentimeter low-attenuation lesions in both kidneys, too small to characterize, but statistically likely to represent tiny cysts. Bilateral adrenal glands are normal in appearance. No hydroureteronephrosis. Tiny calcifications lying dependently in the urinary bladder on the left side adjacent to the left ureterovesicular junction, likely represent tiny stones. Urinary bladder is otherwise unremarkable in appearance. Stomach/Bowel: Stomach is mildly distended, but otherwise unremarkable in appearance. Compared to the prior examination there has been interval development of severe dilatation of the small bowel which measures up to 6.8 cm in diameter in the right-sided the abdomen (axial image 32 of series 2). 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 There is some dilatation of a loop of small bowel within this large left-sided Spigelian hernia which measures up to 3.9 cm in diameter. More distal loops of small bowel in the central abdomen and right side of the abdomen are more dramatically dilated up to 6.8 cm in diameter. Shortly before the right lower quadrant ostomy there is some focal narrowing of the distal small bowel where there appears to be some mucosal hyperenhancement, best appreciated on axial images 43-45 of series 2. This appearance is suggestive for a focus of active Crohn's disease. Vascular/Lymphatic: No significant atherosclerotic disease,  aneurysm or dissection noted in the abdominal or pelvic vasculature. Retroaortic left renal vein (normal anatomical variant) incidentally noted. Numerous borderline enlarged and enlarged ileocolic lymph nodes are again noted, measuring up to 15 mm in short axis on today's examination (axial image 46 of series 2), likely chronic and reactive. Reproductive: Large mass involving the prostate gland extending into the right side of the pelvis involving the right levator ani musculature, right pelvic sidewall and right seminal vesicle overall appears similar to the prior study, estimated to measure approximately 6.7 x 5.1 cm on today's examination (axial image 73 of series 2). Other: No significant volume of ascites.  No pneumoperitoneum. Musculoskeletal: Multiple sclerotic lesions are again noted throughout the bony pelvis, compatible with widespread metastatic disease to the bones, with the largest lesions involving the intertrochanteric region of the right femur and the right ischium. IMPRESSION: 1. Findings on today's examination suggests at least partial small bowel obstruction, presumably related to a focus of active Crohn's disease in the distal small bowel shortly before the right lower quadrant ostomy, as discussed above. 2. 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 As the small bowel proximal to this is nondilated, this is unlikely to represent a source of bowel obstruction at this time. 3. Chronic reactive ileocolic lymphadenopathy similar to the prior  examination. 4. Large right-sided prostate mass invading the right pelvic sidewall and involving the right seminal vesicle similar to prior examinations. 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 5. Mild pancreatic ductal dilatation (5 mm) is of uncertain etiology and significance, and only slightly more prominent than the prior study. No obstructing pancreatic mass identified. 6.  Additional incidental findings, as above. Electronically Signed   By: Vinnie Langton M.D.   On: 11/14/2017 16:50    Scheduled Meds: . enoxaparin (LOVENOX) injection  40 mg Subcutaneous QHS  . methylPREDNISolone (SOLU-MEDROL) injection  60 mg Intravenous Q8H    Continuous Infusions: . sodium chloride 75 mL/hr at 11/14/17 2350  . sodium chloride       Time spent: 25 mins I have personally reviewed and interpreted on  11/15/2017 daily labs, tele strips, imagings as discussed above under date review session and assessment and plans.  I reviewed all nursing notes, pharmacy notes, consultant notes,  vitals, pertinent old records  I have discussed plan of care as described above with RN , patient and family on 11/15/2017   Florencia Reasons MD, PhD  [facility] Pager [phone]. If 7PM-7AM, please contact night-coverage at [email or web address], password [facility] 11/15/2017, 7:40 AM  LOS: 1 day

## 2017-11-16 LAB — CBC WITH DIFFERENTIAL/PLATELET
BASOS PCT: 0 %
Basophils Absolute: 0 10*3/uL (ref 0.0–0.1)
EOS ABS: 0 10*3/uL (ref 0.0–0.7)
Eosinophils Relative: 0 %
HCT: 27 % — ABNORMAL LOW (ref 39.0–52.0)
Hemoglobin: 9 g/dL — ABNORMAL LOW (ref 13.0–17.0)
Lymphocytes Relative: 7 %
Lymphs Abs: 1.1 10*3/uL (ref 0.7–4.0)
MCH: 29.5 pg (ref 26.0–34.0)
MCHC: 33.3 g/dL (ref 30.0–36.0)
MCV: 88.5 fL (ref 78.0–100.0)
MONO ABS: 0.7 10*3/uL (ref 0.1–1.0)
MONOS PCT: 5 %
NEUTROS ABS: 12.7 10*3/uL — AB (ref 1.7–7.7)
Neutrophils Relative %: 88 %
Platelets: 361 10*3/uL (ref 150–400)
RBC: 3.05 MIL/uL — ABNORMAL LOW (ref 4.22–5.81)
RDW: 14.5 % (ref 11.5–15.5)
WBC: 14.4 10*3/uL — ABNORMAL HIGH (ref 4.0–10.5)

## 2017-11-16 LAB — COMPREHENSIVE METABOLIC PANEL
ALBUMIN: 2.5 g/dL — AB (ref 3.5–5.0)
ALT: 12 U/L — ABNORMAL LOW (ref 17–63)
ANION GAP: 3 — AB (ref 5–15)
AST: 18 U/L (ref 15–41)
Alkaline Phosphatase: 51 U/L (ref 38–126)
BILIRUBIN TOTAL: 1 mg/dL (ref 0.3–1.2)
BUN: 18 mg/dL (ref 6–20)
CO2: 28 mmol/L (ref 22–32)
Calcium: 8.3 mg/dL — ABNORMAL LOW (ref 8.9–10.3)
Chloride: 103 mmol/L (ref 101–111)
Creatinine, Ser: 1.08 mg/dL (ref 0.61–1.24)
GFR calc non Af Amer: >60 mL/min (ref >60)
GLUCOSE: 130 mg/dL — AB (ref 65–99)
POTASSIUM: 4.6 mmol/L (ref 3.5–5.1)
Sodium: 134 mmol/L — ABNORMAL LOW (ref 135–145)
TOTAL PROTEIN: 6 g/dL — AB (ref 6.5–8.1)

## 2017-11-16 LAB — HIV ANTIBODY (ROUTINE TESTING W REFLEX): HIV Screen 4th Generation wRfx: NONREACTIVE

## 2017-11-16 LAB — LACTIC ACID, PLASMA: LACTIC ACID, VENOUS: 0.7 mmol/L (ref 0.5–1.9)

## 2017-11-16 MED ORDER — HEPARIN SOD (PORK) LOCK FLUSH 100 UNIT/ML IV SOLN
500.0000 [IU] | INTRAVENOUS | Status: AC | PRN
  Administered 2017-11-16: 500 [IU]

## 2017-11-16 MED ORDER — SODIUM CHLORIDE 0.9% FLUSH: 10.0000 mL | INTRAVENOUS | Status: DC | PRN

## 2017-11-16 MED ORDER — PREDNISONE 5 MG PO TABS: ORAL_TABLET | ORAL | 0 refills | Status: DC

## 2017-11-16 MED ORDER — TRAZODONE HCL 50 MG PO TABS
50.0000 mg | ORAL_TABLET | Freq: Once | ORAL | Status: AC
  Administered 2017-11-16: 50 mg via ORAL
  Filled 2017-11-16: qty 1

## 2017-11-16 NOTE — Discharge Instructions (Signed)
Follow with Primary MD Alycia Rossetti, MD and your GI physician in 7 days   Get CBC, CMP, checked  by Primary MD   in 5-7 days ( we routinely change or add medications that can affect your baseline labs and fluid status, therefore we recommend that you get the mentioned basic workup next visit with your PCP, your PCP may decide not to get them or add new tests based on their clinical decision)  Activity: As tolerated with Full fall precautions use walker/cane & assistance as needed  Disposition Home    Diet: Soft diet for the next 3-4 days then advance to regular consistency heart healthy diet as tolerated  For Heart failure patients - Check your Weight same time everyday, if you gain over 2 pounds, or you develop in leg swelling, experience more shortness of breath or chest pain, call your Primary MD immediately. Follow Cardiac Low Salt Diet and 1.5 lit/day fluid restriction.  On your next visit with your primary care physician please Get Medicines reviewed and adjusted.  Please request your Prim.MD to go over all Hospital Tests and Procedure/Radiological results at the follow up, please get all Hospital records sent to your Prim MD by signing hospital release before you go home.  If you experience worsening of your admission symptoms, develop shortness of breath, life threatening emergency, suicidal or homicidal thoughts you must seek medical attention immediately by calling 911 or calling your MD immediately  if symptoms less severe.  You Must read complete instructions/literature along with all the possible adverse reactions/side effects for all the Medicines you take and that have been prescribed to you. Take any new Medicines after you have completely understood and accpet all the possible adverse reactions/side effects.   Do not drive, operate heavy machinery, perform activities at heights, swimming or participation in water activities or provide baby sitting services if your were  admitted for syncope or siezures until you have seen by Primary MD or a Neurologist and advised to do so again.  Do not drive when taking Pain medications.    Do not take more than prescribed Pain, Sleep and Anxiety Medications  Special Instructions: If you have smoked or chewed Tobacco  in the last 2 yrs please stop smoking, stop any regular Alcohol  and or any Recreational drug use.  Wear Seat belts while driving.   Please note  You were cared for by a hospitalist during your hospital stay. If you have any questions about your discharge medications or the care you received while you were in the hospital after you are discharged, you can call the unit and asked to speak with the hospitalist on call if the hospitalist that took care of you is not available. Once you are discharged, your primary care physician will handle any further medical issues. Please note that NO REFILLS for any discharge medications will be authorized once you are discharged, as it is imperative that you return to your primary care physician (or establish a relationship with a primary care physician if you do not have one) for your aftercare needs so that they can reassess your need for medications and monitor your lab values.

## 2017-11-16 NOTE — Progress Notes (Signed)
Discharge instructions discussed with patient, verbalized agreement and understanding,  Prescription given to patient

## 2017-11-16 NOTE — Discharge Summary (Signed)
Daniel Howard:[id number] DOB: Jul 15, 1956 DOA: 11/14/2017  PCP: Alycia Rossetti, MD  Admit date: 11/14/2017  Discharge date: 11/16/2017  Admitted From: Home   disposition: Home   Recommendations for Outpatient Follow-up:   Follow up with PCP in 1-2 weeks  PCP Please obtain BMP/CBC, 2 view CXR in 1week,  (see Discharge instructions)   PCP Please follow up on the following pending results: Kindly review CT scan results in detail, outpatient PCP follow-up and management of chronic issues.   Home Health: None Equipment/Devices: None Consultations: GI Discharge Condition: Stable CODE STATUS: Full   Diet Recommendation: Soft diet for the next 3-4 days then advance to regular consistency heart healthy diet as tolerated   No chief complaint on file.    Brief history of present illness from the day of admission and additional interim summary    Daniel Howard is a 61 y.o. male with history of metastatic prostate cancer status post chemo, and under  androgen deprivation therapy, history of Crohn's disease, status post colostomy, surgery 76 years ago at [facility].  He presented with increased abdominal pain, decreased ostomy output, vomiting started 2 weeks ago, further workup showed SBO treated conservatively.                                                                   Hospital Course    1.  Partial small bowel obstruction in a patient with aggressive Crohn's disease status post partial colectomy and ileostomy.  Seen by GI, placed on bowel rest along with IV steroids with good resolution of symptoms, he is symptom-free, has liquid stool in the ostomy bag, no nausea or bloating, no pain, tolerated diet for 24 hours without any problems, will be placed on prednisone taper  for the next 10 days with outpatient GI follow-up.  With both GI and PCP to review CT scan report in detail he has multiple nonspecific mostly chronic findings on his abdominal CT scan.   2.  History of metastatic prostate cancer.  He follows with Dr. Alen Blew, no acute issues post discharge continue follow-up with him.  Right subclavian Port-A-Cath site stable and clean.  3.  Moderate to severe protein calorie malnutrition.  GI to monitor outpatient.  Discharge diagnosis     Active Problems:   SBO (small bowel obstruction) [facility])    Discharge instructions    Discharge Instructions    Discharge instructions   Complete by:  As directed    Follow with Primary MD Alycia Rossetti, MD and your GI physician in 7 days   Get CBC, CMP, checked  by Primary MD   in 5-7 days ( we routinely change or add medications that can affect your baseline labs and fluid status, therefore we recommend that  you get the mentioned basic workup next visit with your PCP, your PCP may decide not to get them or add new tests based on their clinical decision)  Activity: As tolerated with Full fall precautions use walker/cane & assistance as needed  Disposition Home    Diet: Soft diet for the next 3-4 days then advance to regular consistency heart healthy diet as tolerated  For Heart failure patients - Check your Weight same time everyday, if you gain over 2 pounds, or you develop in leg swelling, experience more shortness of breath or chest pain, call your Primary MD immediately. Follow Cardiac Low Salt Diet and 1.5 lit/day fluid restriction.  On your next visit with your primary care physician please Get Medicines reviewed and adjusted.  Please request your Prim.MD to go over all Hospital Tests and Procedure/Radiological results at the follow up, please get all Hospital records sent to your Prim MD by signing hospital release before you go home.  If you experience worsening of your admission symptoms, develop  shortness of breath, life threatening emergency, suicidal or homicidal thoughts you must seek medical attention immediately by calling 911 or calling your MD immediately  if symptoms less severe.  You Must read complete instructions/literature along with all the possible adverse reactions/side effects for all the Medicines you take and that have been prescribed to you. Take any new Medicines after you have completely understood and accpet all the possible adverse reactions/side effects.   Do not drive, operate heavy machinery, perform activities at heights, swimming or participation in water activities or provide baby sitting services if your were admitted for syncope or siezures until you have seen by Primary MD or a Neurologist and advised to do so again.  Do not drive when taking Pain medications.    Do not take more than prescribed Pain, Sleep and Anxiety Medications  Special Instructions: If you have smoked or chewed Tobacco  in the last 2 yrs please stop smoking, stop any regular Alcohol  and or any Recreational drug use.  Wear Seat belts while driving.   Please note  You were cared for by a hospitalist during your hospital stay. If you have any questions about your discharge medications or the care you received while you were in the hospital after you are discharged, you can call the unit and asked to speak with the hospitalist on call if the hospitalist that took care of you is not available. Once you are discharged, your primary care physician will handle any further medical issues. Please note that NO REFILLS for any discharge medications will be authorized once you are discharged, as it is imperative that you return to your primary care physician (or establish a relationship with a primary care physician if you do not have one) for your aftercare needs so that they can reassess your need for medications and monitor your lab values.   Increase activity slowly   Complete by:  As  directed       Discharge Medications   Allergies as of 11/16/2017      Reactions   Penicillins Hives   Has patient had a PCN reaction causing immediate rash, facial/tongue/throat swelling, SOB or lightheadedness with hypotension: Yes Has patient had a PCN reaction causing severe rash involving mucus membranes or skin necrosis: No Has patient had a PCN reaction that required hospitalization: No Has patient had a PCN reaction occurring within the last 10 years: No If all of the above answers are "NO", then may proceed  with Cephalosporin use.      Medication List    TAKE these medications   Adalimumab 40 MG/0.8ML Pnkt Commonly known as:  HUMIRA PEN Inject 40 mg into the skin every 14 (fourteen) days.   budesonide 3 MG 24 hr capsule Commonly known as:  ENTOCORT EC Take 3 capsules (9 mg total) by mouth daily.   calcium carbonate 600 MG Tabs tablet Commonly known as:  OS-CAL Take 1,200 mg by mouth daily with breakfast.   CENTRUM SILVER ADULT 50+ Tabs Take 1 tablet by mouth daily.   dexlansoprazole 60 MG capsule Commonly known as:  DEXILANT Take 60 mg by mouth daily.   Ferrous Sulfate 28 MG Tabs Take 1 tablet by mouth daily.   GAS-X PO Take by mouth as needed.   HYDROcodone-acetaminophen 5-325 MG tablet Commonly known as:  NORCO/VICODIN Take 1 tablet by mouth every 6 (six) hours as needed for moderate pain.   lidocaine-prilocaine cream Commonly known as:  EMLA Apply 1 application topically as needed (when accessing port).   mirtazapine 15 MG tablet Commonly known as:  REMERON Take 1 tablet (15 mg total) by mouth at bedtime.   mirtazapine 15 MG tablet Commonly known as:  REMERON TAKE 1 TABLET BY MOUTH AT BEDTIME   ondansetron 4 MG tablet Commonly known as:  ZOFRAN Take 1 tablet (4 mg total) by mouth every 8 (eight) hours as needed for nausea or vomiting.   predniSONE 5 MG tablet Commonly known as:  DELTASONE Label  & dispense according to the schedule below.  take 8 Pills PO for 3 days, 6 Pills PO for 3 days, 4 Pills PO for 3 days, 2 Pills PO for 3 days, 1 Pills PO for 3 days, 1/2 Pill  PO for 3 days then STOP. Total 95 pills.       Follow-up Information    Reinholds, [name], MD. Schedule an appointment as soon as possible for a visit in 1 week(s).   Specialty:  Family Medicine Contact information: [address] [place] [address] [phone]        Daneil Dolin, MD. Schedule an appointment as soon as possible for a visit in 1 week(s).   Specialty:  Gastroenterology Contact information: [address] [place] [address] [phone]           Major procedures and Radiology Reports - PLEASE review detailed and final reports thoroughly  -         Ct Abdomen Pelvis W Contrast  Result Date: 11/14/2017 CLINICAL DATA:  61 year old male with history of increased gas and bloating with abdominal pain, nausea, vomiting and diarrhea. No output in colostomy bag. History of Crohn's disease. Additional history of metastatic prostate cancer. EXAM: CT ABDOMEN AND PELVIS WITH CONTRAST TECHNIQUE: Multidetector CT imaging of the abdomen and pelvis was performed using the standard protocol following bolus administration of intravenous contrast. CONTRAST:  184m ISOVUE-300 IOPAMIDOL (ISOVUE-300) INJECTION 61%, 343mISOVUE-300 IOPAMIDOL (ISOVUE-300) INJECTION 61% COMPARISON:  CT of the abdomen and pelvis 09/22/2017. FINDINGS: Lower chest: Mild scarring or subsegmental atelectasis in the lower lungs bilaterally. Hepatobiliary: No suspicious-appearing cystic or solid hepatic lesions. No intra or extrahepatic biliary ductal dilatation. Status post cholecystectomy. Pancreas: No pancreatic mass. Mild diffuse pancreatic ductal dilatation (5 mm in diameter). No pancreatic or peripancreatic fluid collections or inflammatory changes. Spleen: Unremarkable. Adrenals/Urinary Tract: Multiple subcentimeter low-attenuation lesions in both kidneys, too  small to characterize, but statistically likely to represent tiny cysts. Bilateral adrenal glands are  normal in appearance. No hydroureteronephrosis. Tiny calcifications lying dependently in the urinary bladder on the left side adjacent to the left ureterovesicular junction, likely represent tiny stones. Urinary bladder is otherwise unremarkable in appearance. Stomach/Bowel: Stomach is mildly distended, but otherwise unremarkable in appearance. Compared to the prior examination there has been interval development of severe dilatation of the small bowel which measures up to 6.8 cm in diameter in the right-sided the abdomen (axial image 32 of series 2). 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 There is some dilatation of a loop of small bowel within this large left-sided Spigelian hernia which measures up to 3.9 cm in diameter. More distal loops of small bowel in the central abdomen and right side of the abdomen are more dramatically dilated up to 6.8 cm in diameter. Shortly before the right lower quadrant ostomy there is some focal narrowing of the distal small bowel where there appears to be some mucosal hyperenhancement, best appreciated on axial images 43-45 of series 2. This appearance is suggestive for a focus of active Crohn's disease. Vascular/Lymphatic: No significant atherosclerotic disease, aneurysm or dissection noted in the abdominal or pelvic vasculature. Retroaortic left renal vein (normal anatomical variant) incidentally noted. Numerous borderline enlarged and enlarged ileocolic lymph nodes are again noted, measuring up to 15 mm in short axis on today's examination (axial image 46 of series 2), likely chronic and reactive. Reproductive: Large mass involving the prostate gland extending into the right side of the pelvis involving the right levator ani musculature, right pelvic  sidewall and right seminal vesicle overall appears similar to the prior study, estimated to measure approximately 6.7 x 5.1 cm on today's examination (axial image 73 of series 2). Other: No significant volume of ascites.  No pneumoperitoneum. Musculoskeletal: Multiple sclerotic lesions are again noted throughout the bony pelvis, compatible with widespread metastatic disease to the bones, with the largest lesions involving the intertrochanteric region of the right femur and the right ischium. IMPRESSION: 1. Findings on today's examination suggests at least partial small bowel obstruction, presumably related to a focus of active Crohn's disease in the distal small bowel shortly before the right lower quadrant ostomy, as discussed above. 2. 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 As the small bowel proximal to this is nondilated, this is unlikely to represent a source of bowel obstruction at this time. 3. Chronic reactive ileocolic lymphadenopathy similar to the prior examination. 4. Large right-sided prostate mass invading the right pelvic sidewall and involving the right seminal vesicle similar to prior examinations. 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 5. Mild pancreatic ductal dilatation (5 mm) is of uncertain etiology and significance, and only slightly more prominent than the prior study. No obstructing pancreatic mass identified. 6. Additional incidental findings, as above. Electronically Signed   By: Vinnie Langton M.D.   On: 11/14/2017 16:50    Micro Results     No results found for this or any previous visit (from the past 240 hour(s)).  Today   Subjective    Daniel Howard today has no headache,no chest abdominal pain,no new weakness tingling or numbness, feels much better wants to go home today.     Objective   Blood pressure 95/61, pulse (!) 55, temperature (!) 97.4 F (36.3 C), temperature  source Oral, resp. rate 17, height  5' 4.5" (1.638 m), weight 58.5 kg (129 lb), SpO2 99 %.   Intake/Output Summary (Last 24 hours) at 11/16/2017 1222 Last data filed at 11/16/2017 1000 Gross per 24 hour  Intake 3935 ml  Output 1101 ml  Net 2834 ml    Exam Awake Alert, Oriented x 3, No new F.N deficits, Normal affect [place].AT,PERRAL Supple Neck,No JVD, No cervical lymphadenopathy appriciated.  Symmetrical Chest wall movement, Good air movement bilaterally, CTAB RRR,No Gallops,Rubs or new Murmurs, No Parasternal Heave +ve B.Sounds, Abd Soft, Non tender, No organomegaly appriciated, No rebound -guarding or rigidity.  Ostomy bag has liquid stool No Cyanosis, Clubbing or edema, No new Rash or bruise   Data Review   CBC w Diff:  Lab Results  Component Value Date   WBC 14.4 (H) 11/16/2017   HGB 9.0 (L) 11/16/2017   HGB 11.2 (L) 09/21/2017   HCT 27.0 (L) 11/16/2017   HCT 33.3 (L) 09/21/2017   PLT 361 11/16/2017   PLT 333 09/21/2017   LYMPHOPCT 7 11/16/2017   LYMPHOPCT 33.2 09/21/2017   MONOPCT 5 11/16/2017   MONOPCT 5.9 09/21/2017   EOSPCT 0 11/16/2017   EOSPCT 7.0 09/21/2017   BASOPCT 0 11/16/2017   BASOPCT 2.1 (H) 09/21/2017    CMP:  Lab Results  Component Value Date   NA 134 (L) 11/16/2017   NA 136 09/21/2017   K 4.6 11/16/2017   K 4.1 09/21/2017   CL 103 11/16/2017   CO2 28 11/16/2017   CO2 26 09/21/2017   BUN 18 11/16/2017   BUN 15.1 09/21/2017   CREATININE 1.08 11/16/2017   CREATININE 1.3 09/21/2017   PROT 6.0 (L) 11/16/2017   PROT 7.9 09/21/2017   ALBUMIN 2.5 (L) 11/16/2017   ALBUMIN 3.3 (L) 09/21/2017   BILITOT 1.0 11/16/2017   BILITOT 0.59 09/21/2017   ALKPHOS 51 11/16/2017   ALKPHOS 81 09/21/2017   AST 18 11/16/2017   AST 20 09/21/2017   ALT 12 (L) 11/16/2017   ALT 9 09/21/2017  .   Total Time in preparing paper work, data evaluation and todays exam - 11 minutes  Lala Lund M.D on 11/16/2017 at 12:22 PM  [facility]   Office   [phone]

## 2017-11-16 NOTE — Consult Note (Signed)
[facility] Nurse ostomy follow up Stoma type/location: RLQ ileostomy, long stoma Stomal assessment/size: 1 and 3/8 inches, red, moist Peristomal assessment: not seen today Treatment options for stomal/peristomal skin: skin barrier ring and convex pouching system Output: light brown stool; patient's pouch is > 1/2 full and needs to be emptied.  He has just finished lunch and I encourage him to get up now and empty.  I wait and we resume session following emptying.  Ostomy pouching: 1pc. Convex with skin barrier ring.  Education provided: Patient is confident in pouching and in ostomy management, states that occasionally even at home he has leaks due to failing to empty in a timely manner. Has the assistance of his wife at home. Will continue use of [employer] products once home.  Knows how to contact [facility] nurse if needed in the future. Enrolled patient in [place] Start Discharge program: No WOC nursing team will not follow after discharge (expected today), but will remain available to this patient, the nursing and medical teams.  Please re-consult if needed. Thanks, Maudie Flakes, MSN, RN, [place], Arther Abbott  Pager# [phone]

## 2017-11-16 NOTE — Progress Notes (Signed)
Initial Nutrition Assessment  DOCUMENTATION CODES:   Non-severe (moderate) malnutrition in context of chronic illness  INTERVENTION:   Provided an Ensure supplement at bedside.  Encouraged pt to continue supplements at home and to increase to BID if weight continues to decrease  NUTRITION DIAGNOSIS:   Moderate Malnutrition related to chronic illness(crohn's disease) as evidenced by percent weight loss, moderate fat depletion, moderate muscle depletion.  GOAL:   Patient will meet greater than or equal to 90% of their needs  MONITOR:   PO intake, Supplement acceptance, Labs, Weight trends, I & O's  REASON FOR ASSESSMENT:   Consult Assessment of nutrition requirement/status  ASSESSMENT:   61 year old male with a history of Crohn's disease. He has an ostomy. Yesterday he came in to the hospital with complaints of abdominal pain and his ostomy was not putting anything out. CT of the abdomen showed evidence of active Crohn's disease and distal small bowel obstruction. He was admitted to the hospital and started on Solu-Medrol 40 mg IV 3 times a day.   Patient in room with breakfast tray at bedside. RD notes that pt consumed 100% of his cream of chicken soup, tomato juice and hot chocolate this morning. Pt states he is tolerating this well. Pt reports output from his ostomy. Pt states he typically tries to consume protein foods with every meal and drinks 1 Ensure daily. RD provided a strawberry Ensure at bedside for patient to drink. Pt reports he might go home today. Encouraged pt to request another Ensure later, if desired.  Per chart review, pt has lost 8 lb since 10/3 (6% wt loss x 2 months, significant for time frame).   Medications: Protonix tablet daily Labs reviewed: Low Na  NUTRITION - FOCUSED PHYSICAL EXAM:    Most Recent Value  Orbital Region  Mild depletion  Upper Arm Region  Mild depletion  Thoracic and Lumbar Region  Unable to assess  Buccal Region  No depletion   Temple Region  Moderate depletion  Clavicle Bone Region  Moderate depletion  Clavicle and Acromion Bone Region  Moderate depletion  Scapular Bone Region  Moderate depletion  Dorsal Hand  No depletion  Patellar Region  Unable to assess  Anterior Thigh Region  Unable to assess  Posterior Calf Region  Unable to assess  Edema (RD Assessment)  None       Diet Order:  Diet full liquid Room service appropriate? Yes; Fluid consistency: Thin  EDUCATION NEEDS:   Education needs have been addressed  Skin:  Skin Assessment: Reviewed RN Assessment  Last BM:  11/26- ileostomy  Height:   Ht Readings from Last 1 Encounters:  11/14/17 5' 4.5" (1.638 m)    Weight:   Wt Readings from Last 1 Encounters:  11/14/17 129 lb (58.5 kg)    Ideal Body Weight:  61.8 kg  BMI:  Body mass index is 21.8 kg/m.  Estimated Nutritional Needs:   Kcal:  1650-1850  Protein:  80-90g  Fluid:  1.9L/day   Clayton Bibles, MS, RD, LDN [place] Dietitian Pager: [phone] After Hours Pager: [phone]

## 2017-11-19 ENCOUNTER — Ambulatory Visit (HOSPITAL_COMMUNITY): Payer: Medicare Other

## 2017-11-20 ENCOUNTER — Other Ambulatory Visit (HOSPITAL_BASED_OUTPATIENT_CLINIC_OR_DEPARTMENT_OTHER): Payer: Medicare Other

## 2017-11-20 ENCOUNTER — Ambulatory Visit (HOSPITAL_BASED_OUTPATIENT_CLINIC_OR_DEPARTMENT_OTHER): Payer: Medicare Other

## 2017-11-20 DIAGNOSIS — C7951 Secondary malignant neoplasm of bone: Secondary | ICD-10-CM

## 2017-11-20 DIAGNOSIS — Z452 Encounter for adjustment and management of vascular access device: Secondary | ICD-10-CM | POA: Diagnosis not present

## 2017-11-20 DIAGNOSIS — C61 Malignant neoplasm of prostate: Secondary | ICD-10-CM | POA: Diagnosis not present

## 2017-11-20 DIAGNOSIS — Z95828 Presence of other vascular implants and grafts: Secondary | ICD-10-CM

## 2017-11-20 LAB — CBC WITH DIFFERENTIAL/PLATELET
BASO%: 0.2 % (ref 0.0–2.0)
Basophils Absolute: 0 10*3/uL (ref 0.0–0.1)
EOS%: 1 % (ref 0.0–7.0)
Eosinophils Absolute: 0.1 10*3/uL (ref 0.0–0.5)
HEMATOCRIT: 32.5 % — AB (ref 38.4–49.9)
HEMOGLOBIN: 10.6 g/dL — AB (ref 13.0–17.1)
LYMPH#: 3.5 10*3/uL — AB (ref 0.9–3.3)
LYMPH%: 34.6 % (ref 14.0–49.0)
MCH: 29.7 pg (ref 27.2–33.4)
MCHC: 32.6 g/dL (ref 32.0–36.0)
MCV: 91 fL (ref 79.3–98.0)
MONO#: 0.9 10*3/uL (ref 0.1–0.9)
MONO%: 8.4 % (ref 0.0–14.0)
NEUT#: 5.6 10*3/uL (ref 1.5–6.5)
NEUT%: 55.8 % (ref 39.0–75.0)
Platelets: 377 10*3/uL (ref 140–400)
RBC: 3.57 10*6/uL — ABNORMAL LOW (ref 4.20–5.82)
RDW: 15.3 % — AB (ref 11.0–14.6)
WBC: 10.1 10*3/uL (ref 4.0–10.3)

## 2017-11-20 LAB — COMPREHENSIVE METABOLIC PANEL
ALBUMIN: 3.1 g/dL — AB (ref 3.5–5.0)
ALT: 45 U/L (ref 0–55)
AST: 38 U/L — AB (ref 5–34)
Alkaline Phosphatase: 62 U/L (ref 40–150)
Anion Gap: 9 mEq/L (ref 3–11)
BUN: 16.6 mg/dL (ref 7.0–26.0)
CALCIUM: 8.9 mg/dL (ref 8.4–10.4)
CHLORIDE: 100 meq/L (ref 98–109)
CO2: 27 mEq/L (ref 22–29)
CREATININE: 1.2 mg/dL (ref 0.7–1.3)
EGFR: >60 mL/min/{1.73_m2} (ref >60)
GLUCOSE: 99 mg/dL (ref 70–140)
POTASSIUM: 3.3 meq/L — AB (ref 3.5–5.1)
SODIUM: 137 meq/L (ref 136–145)
Total Bilirubin: 0.66 mg/dL (ref 0.20–1.20)
Total Protein: 7.5 g/dL (ref 6.4–8.3)

## 2017-11-20 MED ORDER — HEPARIN SOD (PORK) LOCK FLUSH 100 UNIT/ML IV SOLN
500.0000 [IU] | Freq: Once | INTRAVENOUS | Status: AC
  Administered 2017-11-20: 500 [IU] via INTRAVENOUS
  Filled 2017-11-20: qty 5

## 2017-11-20 MED ORDER — SODIUM CHLORIDE 0.9% FLUSH
10.0000 mL | INTRAVENOUS | Status: AC | PRN
  Administered 2017-11-20: 10 mL via INTRAVENOUS
  Filled 2017-11-20: qty 10

## 2017-11-20 NOTE — Patient Instructions (Signed)
Implanted Port Home Guide An implanted port is a type of central line that is placed under the skin. Central lines are used to provide IV access when treatment or nutrition needs to be given through a person's veins. Implanted ports are used for long-term IV access. An implanted port may be placed because:  You need IV medicine that would be irritating to the small veins in your hands or arms.  You need long-term IV medicines, such as antibiotics.  You need IV nutrition for a long period.  You need frequent blood draws for lab tests.  You need dialysis.  Implanted ports are usually placed in the chest area, but they can also be placed in the upper arm, the abdomen, or the leg. An implanted port has two main parts:  Reservoir. The reservoir is round and will appear as a small, raised area under your skin. The reservoir is the part where a needle is inserted to give medicines or draw blood.  Catheter. The catheter is a thin, flexible tube that extends from the reservoir. The catheter is placed into a large vein. Medicine that is inserted into the reservoir goes into the catheter and then into the vein.  How will I care for my incision site? Do not get the incision site wet. Bathe or shower as directed by your health care provider. How is my port accessed? Special steps must be taken to access the port:  Before the port is accessed, a numbing cream can be placed on the skin. This helps numb the skin over the port site.  Your health care provider uses a sterile technique to access the port. ? Your health care provider must put on a mask and sterile gloves. ? The skin over your port is cleaned carefully with an antiseptic and allowed to dry. ? The port is gently pinched between sterile gloves, and a needle is inserted into the port.  Only "non-coring" port needles should be used to access the port. Once the port is accessed, a blood return should be checked. This helps ensure that the port  is in the vein and is not clogged.  If your port needs to remain accessed for a constant infusion, a clear (transparent) bandage will be placed over the needle site. The bandage and needle will need to be changed every week, or as directed by your health care provider.  Keep the bandage covering the needle clean and dry. Do not get it wet. Follow your health care provider's instructions on how to take a shower or bath while the port is accessed.  If your port does not need to stay accessed, no bandage is needed over the port.  What is flushing? Flushing helps keep the port from getting clogged. Follow your health care provider's instructions on how and when to flush the port. Ports are usually flushed with saline solution or a medicine called heparin. The need for flushing will depend on how the port is used.  If the port is used for intermittent medicines or blood draws, the port will need to be flushed: ? After medicines have been given. ? After blood has been drawn. ? As part of routine maintenance.  If a constant infusion is running, the port may not need to be flushed.  How long will my port stay implanted? The port can stay in for as long as your health care provider thinks it is needed. When it is time for the port to come out, surgery will be   done to remove it. The procedure is similar to the one performed when the port was put in. When should I seek immediate medical care? When you have an implanted port, you should seek immediate medical care if:  You notice a bad smell coming from the incision site.  You have swelling, redness, or drainage at the incision site.  You have more swelling or pain at the port site or the surrounding area.  You have a fever that is not controlled with medicine.  This information is not intended to replace advice given to you by your health care provider. Make sure you discuss any questions you have with your health care provider. Document  Released: 12/08/2005 Document Revised: 05/15/2016 Document Reviewed: 08/15/2013 Elsevier Interactive Patient Education  2017 [employer]vier Inc.  

## 2017-11-21 ENCOUNTER — Emergency Department (HOSPITAL_COMMUNITY): Payer: Medicare Other

## 2017-11-21 ENCOUNTER — Encounter (HOSPITAL_COMMUNITY): Payer: Self-pay

## 2017-11-21 ENCOUNTER — Inpatient Hospital Stay (HOSPITAL_COMMUNITY)
Admission: EM | Admit: 2017-11-21 | Discharge: 2017-11-26 | DRG: 385 | Disposition: A | Discharge status: Home / Self Care | Payer: Medicare Other | Attending: Internal Medicine | Admitting: Internal Medicine

## 2017-11-21 ENCOUNTER — Other Ambulatory Visit: Payer: Self-pay

## 2017-11-21 DIAGNOSIS — K56609 Unspecified intestinal obstruction, unspecified as to partial versus complete obstruction: Secondary | ICD-10-CM | POA: Diagnosis not present

## 2017-11-21 DIAGNOSIS — R1085 Abdominal pain of multiple sites: Secondary | ICD-10-CM | POA: Diagnosis present

## 2017-11-21 DIAGNOSIS — Z880 Allergy status to penicillin: Secondary | ICD-10-CM

## 2017-11-21 DIAGNOSIS — G4700 Insomnia, unspecified: Secondary | ICD-10-CM | POA: Diagnosis present

## 2017-11-21 DIAGNOSIS — Z8711 Personal history of peptic ulcer disease: Secondary | ICD-10-CM

## 2017-11-21 DIAGNOSIS — N179 Acute kidney failure, unspecified: Secondary | ICD-10-CM | POA: Diagnosis not present

## 2017-11-21 DIAGNOSIS — Z803 Family history of malignant neoplasm of breast: Secondary | ICD-10-CM | POA: Diagnosis not present

## 2017-11-21 DIAGNOSIS — Z825 Family history of asthma and other chronic lower respiratory diseases: Secondary | ICD-10-CM | POA: Diagnosis not present

## 2017-11-21 DIAGNOSIS — R109 Unspecified abdominal pain: Secondary | ICD-10-CM | POA: Diagnosis not present

## 2017-11-21 DIAGNOSIS — R1084 Generalized abdominal pain: Secondary | ICD-10-CM

## 2017-11-21 DIAGNOSIS — C7951 Secondary malignant neoplasm of bone: Secondary | ICD-10-CM | POA: Diagnosis present

## 2017-11-21 DIAGNOSIS — K439 Ventral hernia without obstruction or gangrene: Secondary | ICD-10-CM | POA: Diagnosis present

## 2017-11-21 DIAGNOSIS — D649 Anemia, unspecified: Secondary | ICD-10-CM | POA: Diagnosis present

## 2017-11-21 DIAGNOSIS — Z818 Family history of other mental and behavioral disorders: Secondary | ICD-10-CM

## 2017-11-21 DIAGNOSIS — K219 Gastro-esophageal reflux disease without esophagitis: Secondary | ICD-10-CM | POA: Diagnosis present

## 2017-11-21 DIAGNOSIS — K432 Incisional hernia without obstruction or gangrene: Secondary | ICD-10-CM | POA: Diagnosis present

## 2017-11-21 DIAGNOSIS — Z6821 Body mass index (BMI) 21.0-21.9, adult: Secondary | ICD-10-CM

## 2017-11-21 DIAGNOSIS — E43 Unspecified severe protein-calorie malnutrition: Secondary | ICD-10-CM | POA: Diagnosis present

## 2017-11-21 DIAGNOSIS — K50112 Crohn's disease of large intestine with intestinal obstruction: Secondary | ICD-10-CM | POA: Diagnosis not present

## 2017-11-21 DIAGNOSIS — Z800 Family history of malignant neoplasm of digestive organs: Secondary | ICD-10-CM | POA: Diagnosis not present

## 2017-11-21 DIAGNOSIS — K56699 Other intestinal obstruction unspecified as to partial versus complete obstruction: Secondary | ICD-10-CM | POA: Diagnosis not present

## 2017-11-21 DIAGNOSIS — R634 Abnormal weight loss: Secondary | ICD-10-CM

## 2017-11-21 DIAGNOSIS — Z932 Ileostomy status: Secondary | ICD-10-CM

## 2017-11-21 DIAGNOSIS — D530 Protein deficiency anemia: Secondary | ICD-10-CM | POA: Diagnosis not present

## 2017-11-21 DIAGNOSIS — Z8249 Family history of ischemic heart disease and other diseases of the circulatory system: Secondary | ICD-10-CM

## 2017-11-21 DIAGNOSIS — K50819 Crohn's disease of both small and large intestine with unspecified complications: Secondary | ICD-10-CM

## 2017-11-21 DIAGNOSIS — R64 Cachexia: Secondary | ICD-10-CM | POA: Diagnosis present

## 2017-11-21 DIAGNOSIS — K50012 Crohn's disease of small intestine with intestinal obstruction: Secondary | ICD-10-CM | POA: Diagnosis not present

## 2017-11-21 DIAGNOSIS — D630 Anemia in neoplastic disease: Secondary | ICD-10-CM | POA: Diagnosis present

## 2017-11-21 DIAGNOSIS — C61 Malignant neoplasm of prostate: Secondary | ICD-10-CM | POA: Diagnosis not present

## 2017-11-21 DIAGNOSIS — K5090 Crohn's disease, unspecified, without complications: Secondary | ICD-10-CM | POA: Diagnosis present

## 2017-11-21 DIAGNOSIS — R1115 Cyclical vomiting syndrome unrelated to migraine: Secondary | ICD-10-CM

## 2017-11-21 LAB — COMPREHENSIVE METABOLIC PANEL
ALBUMIN: 3.2 g/dL — AB (ref 3.5–5.0)
ALT: 47 U/L (ref 17–63)
AST: 34 U/L (ref 15–41)
Alkaline Phosphatase: 72 U/L (ref 38–126)
Anion gap: 9 (ref 5–15)
BUN: 12 mg/dL (ref 6–20)
CHLORIDE: 96 mmol/L — AB (ref 101–111)
CO2: 29 mmol/L (ref 22–32)
Calcium: 9 mg/dL (ref 8.9–10.3)
Creatinine, Ser: 1.27 mg/dL — ABNORMAL HIGH (ref 0.61–1.24)
GFR calc Af Amer: >60 mL/min (ref >60)
Glucose, Bld: 122 mg/dL — ABNORMAL HIGH (ref 65–99)
POTASSIUM: 4.1 mmol/L (ref 3.5–5.1)
Sodium: 134 mmol/L — ABNORMAL LOW (ref 135–145)
Total Bilirubin: 0.8 mg/dL (ref 0.3–1.2)
Total Protein: 7.5 g/dL (ref 6.5–8.1)

## 2017-11-21 LAB — CBC
HEMATOCRIT: 35.3 % — AB (ref 39.0–52.0)
Hemoglobin: 11.5 g/dL — ABNORMAL LOW (ref 13.0–17.0)
MCH: 29.6 pg (ref 26.0–34.0)
MCHC: 32.6 g/dL (ref 30.0–36.0)
MCV: 91 fL (ref 78.0–100.0)
Platelets: 404 10*3/uL — ABNORMAL HIGH (ref 150–400)
RBC: 3.88 MIL/uL — ABNORMAL LOW (ref 4.22–5.81)
RDW: 16 % — AB (ref 11.5–15.5)
WBC: 9.3 10*3/uL (ref 4.0–10.5)

## 2017-11-21 LAB — URINALYSIS, ROUTINE W REFLEX MICROSCOPIC
Bilirubin Urine: NEGATIVE
GLUCOSE, UA: NEGATIVE mg/dL
Hgb urine dipstick: NEGATIVE
Ketones, ur: NEGATIVE mg/dL
LEUKOCYTES UA: NEGATIVE
Nitrite: NEGATIVE
PH: 7 (ref 5.0–8.0)
Protein, ur: NEGATIVE mg/dL
SPECIFIC GRAVITY, URINE: 1.011 (ref 1.005–1.030)

## 2017-11-21 LAB — PSA: PROSTATE SPECIFIC AG, SERUM: 0.8 ng/mL (ref 0.0–4.0)

## 2017-11-21 LAB — LIPASE, BLOOD: LIPASE: 27 U/L (ref 11–51)

## 2017-11-21 MED ORDER — IOPAMIDOL (ISOVUE-300) INJECTION 61%
INTRAVENOUS | Status: AC
  Administered 2017-11-21: 100 mL
  Filled 2017-11-21: qty 100

## 2017-11-21 MED ORDER — SODIUM CHLORIDE 0.9 % IV BOLUS (SEPSIS)
1000.0000 mL | Freq: Once | INTRAVENOUS | Status: AC
  Administered 2017-11-21: 1000 mL via INTRAVENOUS

## 2017-11-21 MED ORDER — MORPHINE SULFATE (PF) 4 MG/ML IV SOLN
2.0000 mg | Freq: Once | INTRAVENOUS | Status: AC
  Administered 2017-11-21: 2 mg via INTRAVENOUS
  Filled 2017-11-21: qty 1

## 2017-11-21 NOTE — ED Triage Notes (Signed)
Pt has port-a-cath and says he needs his blood drawn from there.

## 2017-11-21 NOTE — ED Notes (Signed)
Patient transported to CT 

## 2017-11-21 NOTE — ED Triage Notes (Signed)
Pt states he was discharged from hospital on Tuesday. He reports he has increased abd distention and bloating. Pt has a colostomy. Some stool present. Hx of chron's.

## 2017-11-21 NOTE — ED Notes (Signed)
Delay in blood draw,   Pt request blood draw from port.

## 2017-11-21 NOTE — ED Provider Notes (Signed)
[facility] EMERGENCY DEPARTMENT Provider Note   CSN: [id number] Arrival date & time: 11/21/17  1718     History   Chief Complaint Chief Complaint  Patient presents with  . Abdominal Pain    HPI Daniel Howard is a 61 y.o. male with PMHx crohn disease s/p bowel resection and chronic ileostomy, prostate cancer with bony mets (last chemo in August), b/l spigelian hernia, presenting to the ED after recent admission for partial small bowel obstruction. Pt presented to ED on 11/14/17 with abdominal pain, abd distension and vomiting. Pt was discharged on 11/16/17 after resolution of symptoms, with prednisone taper. Pt states he has been taking medication as prescribed. States 2 days after discharge the symptoms returned and have been progressively worsening since that time. Current symptoms are generalized intermittent aching abdominal pain with abdominal distension. States this feels that same as when he initially reported to ED. Denies N/V. States his ileostomy has slightly decreased output. Denies F/C, urinary sx, or other complaints.  The history is provided by the patient.    Past Medical History:  Diagnosis Date  . Abnormal finding of biliary tract    MRCP shows pancreatic/biliary tract dilation. EUS 2010 confirmed dilation but no chronic pancreaitis or mass. Vascular ectasia crimpoing distal CBD.   [name] Anxiety   . Cancer ([place])   . 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 Duodenal ulcer 2010   nsaids  . Low back pain   . Peristomal hernia   . Small bowel obstruction ([place])   . Spigelian hernia    bilateral    Patient  Active Problem List   Diagnosis Date Noted  . Nausea with vomiting 11/10/2017  . Spigelian hernia 09/22/2017  . Abdominal pain of multiple sites 09/22/2017  . Goals of care, counseling/discussion 04/02/2017  . Prostate cancer ([place]) 04/02/2017  . Elevated PSA 03/04/2017  . Osteopenia 11/03/2016  . Pelvic mass in male 09/04/2016  . 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 Exacerbation of Crohn's disease ([place]) 09/04/2016  . Trigger finger, acquired 10/31/2015  . Protein-calorie malnutrition, severe ([place]) 07/27/2014  . SBO (small bowel obstruction) ([place]) 07/26/2014  . Loss of weight 06/09/2014  . Hypotension, unspecified 06/09/2014  . Crohn's disease of both small and large intestine with complication ([place]) 19/62/2297  . Boils 02/11/2013  . Ventral hernia 12/11/2009  . Anemia 12/03/2009  . Regional enteritis ([place]) 01/25/2007  . LOW BACK PAIN 01/25/2007    Past Surgical History:  Procedure Laterality Date  . APPENDECTOMY  1992   at time of exp laparotomy at which time he was noted to have fistulizing Crohn's rather than UC  . COLONOSCOPY [name] 08/23/2014   LGX:[id number] proctoscopy with possible fistulous opening in thebase of rectal/anal stump.    . COLOSTOMY  1992   transverse loop colostomy secondary to a stricture  . ESOPHAGOGASTRODUODENOSCOPY  05/2009   SLF: multiple antral erosions, large ulcer at ansatomosis (postsurgical changes at duodenal bulb and second portion of duodenum) BX c/x NSAIDS.  . EUS  10/04/2009   Dr. Estill Bakes dilated CBD and main pancreatic duct.  No pancreatic  . FLEXIBLE SIGMOIDOSCOPY  1988   Dr. Laural Golden- suggested rohn's disease but the biopsies were not  collaborative.  [name] FLEXIBLE SIGMOIDOSCOPY [name] 09/08/2016   Procedure: FLEXIBLE SIGMOIDOSCOPY;  Surgeon: Wonda Horner, MD;  Location: [facility] ENDOSCOPY;  Service: Gastroenterology;  Laterality: [name];  . HEMICOLOECTOMY W/ ANASTOMOSIS  1993   R- Dr.DeMason   . HERNIA REPAIR  1996   incarcerated periostial hernia with  additional surgery in 1999  . IR FLUORO GUIDE PORT INSERTION RIGHT  04/03/2017  . IR US GUIDE VASC ACCESS RIGHT  04/03/2017       Home Medications    Prior to Admission medications   Medication Sig Start Date End Date Taking? Authorizing Provider  Adalimumab (HUMIRA PEN) 40 MG/0.8ML PNKT Inject 40 mg into the skin every 14 (fourteen) days. 12/19/16  Yes Annitta Needs, NP  calcium carbonate (OS-CAL) 600 MG TABS tablet Take 1,200 mg by mouth daily with breakfast.   Yes [provider]  Ferrous Sulfate 28 MG TABS Take 1 tablet by mouth daily.   Yes [provider]  HYDROcodone-acetaminophen (NORCO/VICODIN) 5-325 MG tablet Take 1 tablet by mouth every 6 (six) hours as needed for moderate pain. 09/11/17  Yes [place], [name], MD  lidocaine-prilocaine (EMLA) cream Apply 1 application topically as needed (when accessing port).   Yes [provider]  mirtazapine (REMERON) 15 MG tablet TAKE 1 TABLET BY MOUTH AT BEDTIME Patient taking differently: TAKE 15 mg TABLET BY MOUTH AT BEDTIME 10/14/17  Yes Prince, [name], MD  Multiple Vitamins-Minerals (CENTRUM SILVER ADULT 50+) TABS Take 1 tablet by mouth daily.   Yes [provider]  ondansetron (ZOFRAN) 4 MG tablet Take 1 tablet (4 mg total) by mouth every 8 (eight) hours as needed for nausea or vomiting. 11/10/17  Yes Carlis Stable, NP  predniSONE (DELTASONE) 5 MG tablet Label  & dispense according to the schedule below. take 8 Pills PO for 3 days, 6 Pills PO for 3 days, 4 Pills PO for 3 days, 2 Pills PO for 3 days, 1 Pills PO for 3 days, 1/2 Pill  PO for 3 days then STOP. Total 95 pills. 11/16/17  Yes Thurnell Lose, MD  Simethicone (GAS-X PO) Take by mouth as needed.   Yes [provider]  budesonide (ENTOCORT EC) 3 MG 24 hr capsule Take 3 capsules (9 mg total) by mouth daily. Patient not taking: Reported on 11/21/2017 11/10/17   Carlis Stable, NP    Family History Family History  Problem Relation Age of  Onset  . Cancer Father        prostate   . Prostate cancer Father   . Colon cancer Father 89  . Hypertension Sister   . Cancer Sister   . Depression Sister   . Breast cancer Sister   . COPD Sister   . Aneurysm Brother        deceased, brain aneurysm    Social History Social History   Tobacco Use  . Smoking status: Former Smoker    Packs/day: 0.50    Years: 2.00    Pack years: 1.00    Types: Cigarettes    Last attempt to quit: 12/21/2009    Years since quitting: 7.9  . Smokeless tobacco: Never Used  . Tobacco comment: Quit abut 20 years  Substance Use Topics  . Alcohol use: No    Alcohol/week: 0.0 oz    Comment: Former drinker  . Drug use: No     Allergies   Penicillins   Review of Systems Review of Systems  Constitutional: Negative for chills and  fever.  Gastrointestinal: Positive for abdominal distention and abdominal pain. Negative for constipation, diarrhea, nausea and vomiting.  Genitourinary: Negative for dysuria and frequency.  All other systems reviewed and are negative.    Physical Exam Updated Vital Signs BP 109/74   Pulse 87   Temp 98.6 F (37 C) (Oral)   Resp 14   Ht 5' 5"  (1.651 m)   Wt 58.5 kg (129 lb)   SpO2 96%   BMI 21.47 kg/m   Physical Exam  Constitutional: He appears well-developed and well-nourished. No distress.  Chronically ill-appearing, thin male.  HENT:  Head: Normocephalic and atraumatic.  Mouth/Throat: Oropharynx is clear and moist.  Eyes: Conjunctivae are normal.  Cardiovascular: Normal rate, regular rhythm, normal heart sounds and intact distal pulses.  Pulmonary/Chest: Effort normal and breath sounds normal.  Abdominal: Soft. Bowel sounds are normal.  Multiple surgical scars. Large soft nontender hernia left abdomen. Ileostomy with yellow/brown stool, no blood. Abdomen with generalized tenderness, no guarding. No erythema surrounding ileostomy bag.   Neurological: He is alert.  Skin: Skin is warm.  Psychiatric: He  has a normal mood and affect. His behavior is normal.  Nursing note and vitals reviewed.    ED Treatments / Results  Labs (all labs ordered are listed, but only abnormal results are displayed) Labs Reviewed  COMPREHENSIVE METABOLIC PANEL - Abnormal; Notable for the following components:      Result Value   Sodium 134 (*)    Chloride 96 (*)    Glucose, Bld 122 (*)    Creatinine, Ser 1.27 (*)    Albumin 3.2 (*)    All other components within normal limits  CBC - Abnormal; Notable for the following components:   RBC 3.88 (*)    Hemoglobin 11.5 (*)    HCT 35.3 (*)    RDW 16.0 (*)    Platelets 404 (*)    All other components within normal limits  URINALYSIS, ROUTINE W REFLEX MICROSCOPIC - Abnormal; Notable for the following components:   APPearance HAZY (*)    All other components within normal limits  LIPASE, BLOOD    EKG  EKG Interpretation None       Radiology Ct Abdomen Pelvis W Contrast  Result Date: 11/21/2017 CLINICAL DATA:  Abdominal bloating EXAM: CT ABDOMEN AND PELVIS WITH CONTRAST TECHNIQUE: Multidetector CT imaging of the abdomen and pelvis was performed using the standard protocol following bolus administration of intravenous contrast. CONTRAST:  100 mL Isovue-300 intravenous COMPARISON:  11/14/2017, 09/22/2017, 02/05/2017, 09/04/2016 FINDINGS: Lower chest: Lung bases demonstrate small foci of dependent atelectasis. No acute consolidation or pleural effusion. Normal heart size. Hepatobiliary: No focal hepatic abnormality. Surgical absence of the gallbladder. Stable enlarged extrahepatic common bowel duct. Pancreas: No inflammation.  Stable pancreatic ductal dilatation. Spleen: Normal in size without focal abnormality. Adrenals/Urinary Tract: Adrenal glands are within normal limits. Kidneys show no hydronephrosis. The bladder demonstrates punctate dependent stones or calcifications. Stomach/Bowel: The stomach is nonenlarged. Re- demonstrated multiple dilated  fluid-filled loops of small bowel measuring up to 6.8 cm in diameter. Right lower quadrant ostomy. Nondilated small bowel loops near the ostomy in the right hemiabdomen with mucosal enhancement. Re- demonstrated dilated small bowel within a left splayed daily in hernia measuring up to 3 cm. Vascular/Lymphatic: Nonaneurysmal aorta. Re- demonstrated enlarged right lower quadrant lymph nodes measuring up to 14 mm. Retroaortic left renal vein. Reproductive: Re- demonstrated infiltrative prostate mass measuring 6.5 x 4.3 cm with local invasion of structures as before. Other: Negative for free  air or significant free fluid. Large left lower quadrant hernia containing mesentery and bowel as before. Musculoskeletal: Re- demonstrated multiple sclerotic osseous lesions in the pelvis consistent with metastatic disease. IMPRESSION: 1. Continued severe small bowel dilatation/small bowel obstruction without interval improvement since the prior study. No free air. No pneumatosis. Slight decreased mucosal enhancement at the right lower quadrant ostomy, but with residual small bowel loops in the right abdomen near the ostomy demonstrating prominent mucosal enhancement, suspicious for active disease. 2. Grossly stable infiltrative prostate mass with local invasion of adjacent structures. Numerous pelvic skeletal metastatic lesions re- demonstrated. 3. Large left abdominal hernia containing mesentery and bowel ; a slightly enlarged fluid-filled loops of small bowel in the hernia sac appears stable. 4. Re- demonstrated enlarged right lower quadrant lymph nodes. Electronically Signed   By: Donavan Foil M.D.   On: 11/21/2017 22:56    Procedures Procedures (including critical care time)  Medications Ordered in ED Medications  sodium chloride 0.9 % bolus 1,000 mL (not administered)  morphine 4 MG/ML injection 2 mg (2 mg Intravenous Given 11/21/17 2004)  sodium chloride 0.9 % bolus 1,000 mL (0 mLs Intravenous Stopped 11/21/17 2316)   iopamidol (ISOVUE-300) 61 % injection (100 mLs  Contrast Given 11/21/17 2207)     Initial Impression / Assessment and Plan / ED Course  I have reviewed the triage vital signs and the nursing notes.  Pertinent labs & imaging results that were available during my care of the patient were reviewed by me and considered in my medical decision making (see chart for details).   Pt with crohn disease and chronic ileostomy, with recent admission for partial SBO, presenting to ED with recurrent sx of abd pain and distension. Pt states sx are very similar to when he initially presented to ED for SBO. Pt is chronically ill-appearing. Abd with generalized tenderness, no guarding or rebound. Ileostomy bag with yellow/brown stool. Will check labs and repeat CT to rule out possible recurrence of SBO.   CT abdomen showing small bowel obstruction without interval change since previous.  Clinical Course as of Nov 22 110  Sat Nov 21, 2017  2322 Spoke with Dr. [place] Silos with general surgery who recommends medical admission and NG tube. Will see patient this evening.  [JR]    Clinical Course User Index [JR] Dorrian Doggett, [place] N, PA-C   [facility] consulted for admission. Dr. Denton Brick accepting admission.  The patient appears reasonably stabilized for admission considering the current resources, flow, and capabilities available in the ED at this time, and I doubt any other [facility] requiring further screening and/or treatment in the ED prior to admission.  Final Clinical Impressions(s) / ED Diagnoses   Final diagnoses:  Small bowel obstruction [facility])    ED Discharge Orders    None       Eaden Hettinger, [place] N, PA-C 11/22/17 0111    Julianne Rice, MD 12/01/17 1450

## 2017-11-22 ENCOUNTER — Inpatient Hospital Stay (HOSPITAL_COMMUNITY): Payer: Medicare Other

## 2017-11-22 DIAGNOSIS — K56609 Unspecified intestinal obstruction, unspecified as to partial versus complete obstruction: Secondary | ICD-10-CM | POA: Diagnosis not present

## 2017-11-22 DIAGNOSIS — E43 Unspecified severe protein-calorie malnutrition: Secondary | ICD-10-CM | POA: Diagnosis not present

## 2017-11-22 DIAGNOSIS — Z932 Ileostomy status: Secondary | ICD-10-CM | POA: Diagnosis not present

## 2017-11-22 DIAGNOSIS — N179 Acute kidney failure, unspecified: Secondary | ICD-10-CM | POA: Diagnosis not present

## 2017-11-22 DIAGNOSIS — K439 Ventral hernia without obstruction or gangrene: Secondary | ICD-10-CM | POA: Diagnosis present

## 2017-11-22 DIAGNOSIS — Z825 Family history of asthma and other chronic lower respiratory diseases: Secondary | ICD-10-CM | POA: Diagnosis not present

## 2017-11-22 DIAGNOSIS — G4700 Insomnia, unspecified: Secondary | ICD-10-CM | POA: Diagnosis present

## 2017-11-22 DIAGNOSIS — C7951 Secondary malignant neoplasm of bone: Secondary | ICD-10-CM | POA: Diagnosis present

## 2017-11-22 DIAGNOSIS — Z8711 Personal history of peptic ulcer disease: Secondary | ICD-10-CM | POA: Diagnosis not present

## 2017-11-22 DIAGNOSIS — K50912 Crohn's disease, unspecified, with intestinal obstruction: Secondary | ICD-10-CM | POA: Diagnosis not present

## 2017-11-22 DIAGNOSIS — D530 Protein deficiency anemia: Secondary | ICD-10-CM | POA: Diagnosis not present

## 2017-11-22 DIAGNOSIS — K219 Gastro-esophageal reflux disease without esophagitis: Secondary | ICD-10-CM | POA: Diagnosis present

## 2017-11-22 DIAGNOSIS — Z818 Family history of other mental and behavioral disorders: Secondary | ICD-10-CM | POA: Diagnosis not present

## 2017-11-22 DIAGNOSIS — Z803 Family history of malignant neoplasm of breast: Secondary | ICD-10-CM | POA: Diagnosis not present

## 2017-11-22 DIAGNOSIS — K50012 Crohn's disease of small intestine with intestinal obstruction: Secondary | ICD-10-CM | POA: Diagnosis not present

## 2017-11-22 DIAGNOSIS — Z800 Family history of malignant neoplasm of digestive organs: Secondary | ICD-10-CM | POA: Diagnosis not present

## 2017-11-22 DIAGNOSIS — Z880 Allergy status to penicillin: Secondary | ICD-10-CM | POA: Diagnosis not present

## 2017-11-22 DIAGNOSIS — D649 Anemia, unspecified: Secondary | ICD-10-CM | POA: Diagnosis not present

## 2017-11-22 DIAGNOSIS — K50112 Crohn's disease of large intestine with intestinal obstruction: Secondary | ICD-10-CM | POA: Diagnosis present

## 2017-11-22 DIAGNOSIS — Z4682 Encounter for fitting and adjustment of non-vascular catheter: Secondary | ICD-10-CM | POA: Diagnosis not present

## 2017-11-22 DIAGNOSIS — Z8249 Family history of ischemic heart disease and other diseases of the circulatory system: Secondary | ICD-10-CM | POA: Diagnosis not present

## 2017-11-22 DIAGNOSIS — R64 Cachexia: Secondary | ICD-10-CM | POA: Diagnosis present

## 2017-11-22 DIAGNOSIS — D630 Anemia in neoplastic disease: Secondary | ICD-10-CM | POA: Diagnosis present

## 2017-11-22 DIAGNOSIS — Z6821 Body mass index (BMI) 21.0-21.9, adult: Secondary | ICD-10-CM | POA: Diagnosis not present

## 2017-11-22 DIAGNOSIS — C61 Malignant neoplasm of prostate: Secondary | ICD-10-CM | POA: Diagnosis not present

## 2017-11-22 LAB — BASIC METABOLIC PANEL
ANION GAP: 7 (ref 5–15)
BUN: 13 mg/dL (ref 6–20)
CALCIUM: 8.6 mg/dL — AB (ref 8.9–10.3)
CO2: 27 mmol/L (ref 22–32)
Chloride: 100 mmol/L — ABNORMAL LOW (ref 101–111)
Creatinine, Ser: 1.2 mg/dL (ref 0.61–1.24)
Glucose, Bld: 117 mg/dL — ABNORMAL HIGH (ref 65–99)
POTASSIUM: 4.1 mmol/L (ref 3.5–5.1)
SODIUM: 134 mmol/L — AB (ref 135–145)

## 2017-11-22 LAB — CBC
HCT: 29.8 % — ABNORMAL LOW (ref 39.0–52.0)
HEMOGLOBIN: 9.7 g/dL — AB (ref 13.0–17.0)
MCH: 29.7 pg (ref 26.0–34.0)
MCHC: 32.6 g/dL (ref 30.0–36.0)
MCV: 91.1 fL (ref 78.0–100.0)
Platelets: 368 10*3/uL (ref 150–400)
RBC: 3.27 MIL/uL — AB (ref 4.22–5.81)
RDW: 16.1 % — ABNORMAL HIGH (ref 11.5–15.5)
WBC: 8.5 10*3/uL (ref 4.0–10.5)

## 2017-11-22 MED ORDER — SODIUM CHLORIDE 0.9% FLUSH
3.0000 mL | INTRAVENOUS | Status: DC | PRN
  Administered 2017-11-25: 3 mL via INTRAVENOUS
  Filled 2017-11-22: qty 3

## 2017-11-22 MED ORDER — ONDANSETRON HCL 4 MG/2ML IJ SOLN: 4.0000 mg | Freq: Four times a day (QID) | INTRAMUSCULAR | Status: DC | PRN

## 2017-11-22 MED ORDER — LIDOCAINE-PRILOCAINE 2.5-2.5 % EX CREA: 1.0000 "application " | TOPICAL_CREAM | CUTANEOUS | Status: DC | PRN

## 2017-11-22 MED ORDER — SODIUM CHLORIDE 0.9 % IV BOLUS (SEPSIS): 1000.0000 mL | Freq: Once | INTRAVENOUS | Status: AC

## 2017-11-22 MED ORDER — ACETAMINOPHEN 325 MG PO TABS: 650.0000 mg | ORAL_TABLET | Freq: Four times a day (QID) | ORAL | Status: DC | PRN

## 2017-11-22 MED ORDER — SODIUM CHLORIDE 0.9% FLUSH
3.0000 mL | Freq: Two times a day (BID) | INTRAVENOUS | Status: DC
  Administered 2017-11-22 – 2017-11-25 (×5): 3 mL via INTRAVENOUS

## 2017-11-22 MED ORDER — ONDANSETRON HCL 4 MG PO TABS: 4.0000 mg | ORAL_TABLET | Freq: Three times a day (TID) | ORAL | Status: DC | PRN

## 2017-11-22 MED ORDER — POLYETHYLENE GLYCOL 3350 17 G PO PACK: 17.0000 g | PACK | Freq: Every day | ORAL | Status: DC | PRN

## 2017-11-22 MED ORDER — TRAZODONE HCL 50 MG PO TABS: 50.0000 mg | ORAL_TABLET | Freq: Every evening | ORAL | Status: DC | PRN

## 2017-11-22 MED ORDER — DEXTROSE-NACL 5-0.45 % IV SOLN
INTRAVENOUS | Status: DC
  Administered 2017-11-22 – 2017-11-23 (×4): via INTRAVENOUS

## 2017-11-22 MED ORDER — SODIUM CHLORIDE 0.9 % IV SOLN: 250.0000 mL | INTRAVENOUS | Status: DC | PRN

## 2017-11-22 MED ORDER — ACETAMINOPHEN 650 MG RE SUPP: 650.0000 mg | Freq: Four times a day (QID) | RECTAL | Status: DC | PRN

## 2017-11-22 MED ORDER — ONDANSETRON HCL 4 MG/2ML IJ SOLN
4.0000 mg | Freq: Four times a day (QID) | INTRAMUSCULAR | Status: DC | PRN
  Administered 2017-11-22: 4 mg via INTRAVENOUS
  Filled 2017-11-22: qty 2

## 2017-11-22 MED ORDER — FERROUS SULFATE 325 (65 FE) MG PO TABS
325.0000 mg | ORAL_TABLET | Freq: Every day | ORAL | Status: DC
  Administered 2017-11-22 – 2017-11-26 (×5): 325 mg via ORAL
  Filled 2017-11-22 (×5): qty 1

## 2017-11-22 MED ORDER — MIRTAZAPINE 15 MG PO TABS
15.0000 mg | ORAL_TABLET | Freq: Every day | ORAL | Status: DC
  Administered 2017-11-22 – 2017-11-25 (×4): 15 mg via ORAL
  Filled 2017-11-22 (×4): qty 1

## 2017-11-22 MED ORDER — METHYLPREDNISOLONE SODIUM SUCC 40 MG IJ SOLR
40.0000 mg | Freq: Three times a day (TID) | INTRAMUSCULAR | Status: DC
  Administered 2017-11-22 – 2017-11-23 (×4): 40 mg via INTRAVENOUS
  Filled 2017-11-22 (×4): qty 1

## 2017-11-22 MED ORDER — SENNA 8.6 MG PO TABS
1.0000 | ORAL_TABLET | Freq: Two times a day (BID) | ORAL | Status: DC
  Administered 2017-11-22 – 2017-11-26 (×9): 8.6 mg via ORAL
  Filled 2017-11-22 (×9): qty 1

## 2017-11-22 MED ORDER — ALBUTEROL SULFATE (2.5 MG/3ML) 0.083% IN NEBU: 2.5000 mg | INHALATION_SOLUTION | RESPIRATORY_TRACT | Status: DC | PRN

## 2017-11-22 MED ORDER — MORPHINE SULFATE (PF) 4 MG/ML IV SOLN
2.0000 mg | INTRAVENOUS | Status: AC | PRN
  Administered 2017-11-22 – 2017-11-23 (×2): 2 mg via INTRAVENOUS
  Filled 2017-11-22 (×2): qty 1

## 2017-11-22 MED ORDER — PANTOPRAZOLE SODIUM 40 MG IV SOLR
40.0000 mg | INTRAVENOUS | Status: DC
  Administered 2017-11-22 – 2017-11-25 (×4): 40 mg via INTRAVENOUS
  Filled 2017-11-22 (×4): qty 40

## 2017-11-22 MED ORDER — ADULT MULTIVITAMIN W/MINERALS CH
1.0000 | ORAL_TABLET | Freq: Every day | ORAL | Status: DC
  Administered 2017-11-22 – 2017-11-26 (×5): 1 via ORAL
  Filled 2017-11-22 (×5): qty 1

## 2017-11-22 MED ORDER — PREDNISONE 20 MG PO TABS: 10.0000 mg | ORAL_TABLET | Freq: Every morning | ORAL | Status: DC

## 2017-11-22 MED ORDER — IOPAMIDOL (ISOVUE-300) INJECTION 61%
INTRAVENOUS | Status: AC
  Filled 2017-11-22: qty 50

## 2017-11-22 MED ORDER — ONDANSETRON HCL 4 MG PO TABS: 4.0000 mg | ORAL_TABLET | Freq: Four times a day (QID) | ORAL | Status: DC | PRN

## 2017-11-22 MED ORDER — CALCIUM CARBONATE 1250 (500 CA) MG PO TABS
1250.0000 mg | ORAL_TABLET | Freq: Every day | ORAL | Status: DC
  Administered 2017-11-22 – 2017-11-26 (×4): 1250 mg via ORAL
  Filled 2017-11-22 (×5): qty 1

## 2017-11-22 NOTE — Consult Note (Signed)
[facility] Surgery Consult  Daniel Howard 1956/10/05  [id number].    Requesting MD: Roxan Hockey, MD Chief Complaint/Reason for Consult: small bowel obstruction  HPI:  Daniel Howard is a 61 yo male with a pmhx significant for Crohn's disease (s/p small bowel resection and chronic ileostomy), prostate cancer, and bilateral spigelian hernia. He presented to [facility] yesterday due to increased abdominal pain and distension. He has no abdominal pain currently but states he still feels distended. He also admits to decreased output from his ileostomy bag. He was recently admitted 11/24 for partial small bowel obstruction with discharge 11/26 after resolution of symptoms. Previous admission- managed with conservative treatment. He was discharged with prednisone taper which he was compliant with but had not yet finished the course. He admits to LOA but states it has improved minimally this morning. LOI was small amount of ensure prior to coming to the ED. No abdominal pain currently as he states it has improved with IV morphine. +belching, +hiccups.  He denies fevers, chills, N/V, CP, palpitations, SHOB.  ROS: Review of Systems  Constitutional: Negative for chills and fever.  Respiratory: Negative for cough and shortness of breath.   Cardiovascular: Negative for chest pain and palpitations.  Gastrointestinal: Positive for abdominal pain.       Positive for bloating.  Skin: Negative for rash.  All other systems reviewed and are negative.   Family History  Problem Relation Age of Onset  . Cancer Father        prostate   . Prostate cancer Father   . Colon cancer Father 49  . Hypertension Sister   . Cancer Sister   . Depression Sister   . Breast cancer Sister   . COPD Sister   . Aneurysm Brother        deceased, brain aneurysm    Past Medical History:  Diagnosis Date  . Abnormal finding of biliary tract    MRCP shows pancreatic/biliary tract dilation. EUS 2010 confirmed dilation  but no chronic pancreaitis or mass. Vascular ectasia crimpoing distal CBD.   [name] Anxiety   . Cancer ([place])   . 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 Duodenal ulcer 2010   nsaids  . Low back pain   . Peristomal hernia   . Small bowel obstruction ([place])   . Spigelian hernia    bilateral    Past Surgical History:  Procedure Laterality Date  . APPENDECTOMY  1992   at time of exp laparotomy at which time he was noted to have fistulizing Crohn's rather than UC  . COLONOSCOPY [name] 08/23/2014   [id number] proctoscopy with possible fistulous opening in thebase of rectal/anal stump.    . COLOSTOMY  1992   transverse loop colostomy secondary to a stricture  . ESOPHAGOGASTRODUODENOSCOPY  05/2009   SLF: multiple antral erosions, large ulcer at ansatomosis (postsurgical changes at duodenal bulb and second portion of duodenum) BX c/x NSAIDS.  . EUS  10/04/2009   Dr. Estill Bakes dilated CBD and main pancreatic duct.  No pancreatic  . FLEXIBLE SIGMOIDOSCOPY  1988   Dr. Laural Golden- suggested rohn's disease but the biopsies were not collaborative.  [name] FLEXIBLE SIGMOIDOSCOPY [name] 09/08/2016   Procedure: FLEXIBLE SIGMOIDOSCOPY;  Surgeon: Wonda Horner, MD;  Location: [facility];  Service: Gastroenterology;  Laterality: [name];  . HEMICOLOECTOMY W/ ANASTOMOSIS  1993   R- Dr.DeMason   . HERNIA REPAIR  1996   incarcerated periostial hernia with additional surgery in 1999  . IR FLUORO GUIDE PORT INSERTION RIGHT  04/03/2017  . IR US GUIDE VASC ACCESS RIGHT  04/03/2017    Social History:  reports that he quit smoking about 7 years ago. His smoking use included cigarettes. He  has a 1.00 pack-year smoking history. he has never used smokeless tobacco. He reports that he does not drink alcohol or use drugs.  Allergies:  Allergies  Allergen Reactions  . Penicillins Hives    Has patient had a PCN reaction causing immediate rash, facial/tongue/throat swelling, SOB or lightheadedness with hypotension: Yes Has patient had a PCN reaction causing severe rash involving mucus membranes or skin necrosis: No Has patient had a PCN reaction that required hospitalization: No Has patient had a PCN reaction occurring within the last 10 years: No If all of the above answers are "NO", then may proceed with Cephalosporin use.     Medications Prior to Admission  Medication Sig Dispense Refill  . Adalimumab (HUMIRA PEN) 40 MG/0.8ML PNKT Inject 40 mg into the skin every 14 (fourteen) days. 2 each 6  . calcium carbonate (OS-CAL) 600 MG TABS tablet Take 1,200 mg by mouth daily with breakfast.    . Ferrous Sulfate 28 MG TABS Take 1 tablet by mouth daily.    [name] HYDROcodone-acetaminophen (NORCO/VICODIN) 5-325 MG tablet Take 1 tablet by mouth every 6 (six) hours as needed for moderate pain. 60 tablet 0  . lidocaine-prilocaine (EMLA) cream Apply 1 application topically as needed (when accessing port).    . mirtazapine (REMERON) 15 MG tablet TAKE 1 TABLET BY MOUTH AT BEDTIME (Patient taking differently: TAKE 15 mg TABLET BY MOUTH AT BEDTIME) 90 tablet 1  . Multiple Vitamins-Minerals (CENTRUM SILVER ADULT 50+) TABS Take 1 tablet by mouth daily.    . ondansetron (ZOFRAN) 4 MG tablet Take 1 tablet (4 mg total) by mouth every 8 (eight) hours as needed for nausea or vomiting. 30 tablet 1  . predniSONE (DELTASONE) 5 MG tablet Label  & dispense according to the schedule below. take 8 Pills PO for 3 days, 6 Pills PO for 3 days, 4 Pills PO for 3 days, 2 Pills PO for 3 days, 1 Pills PO for 3 days, 1/2 Pill  PO for 3 days then STOP. Total 95 pills. 65 tablet 0  . Simethicone (GAS-X PO) Take by mouth as  needed.    . budesonide (ENTOCORT EC) 3 MG 24 hr capsule Take 3 capsules (9 mg total) by mouth daily. (Patient not taking: Reported on 11/21/2017) 90 capsule 0    Blood pressure 109/73, pulse 68, temperature 98.6 F (37 C), temperature source Oral, resp. rate 16, height 5' 4.5" (1.638 m), weight 56.7 kg (125 lb), SpO2 100 %. Physical Exam: Physical Exam  Constitutional: He is oriented to person, place, and time. He appears cachectic. No distress.  HENT:  Head: Normocephalic and atraumatic.  Cardiovascular: Normal rate, regular rhythm, normal heart sounds and intact distal pulses.  Pulmonary/Chest: Effort normal and breath sounds normal.  Abdominal: Soft. Bowel sounds are increased. There is no tenderness. There is no rigidity, no rebound and no guarding. A hernia is present.    Neurological: He is alert and oriented to person, place, and time.  Skin: Skin is warm and dry.  Psychiatric: He has a normal mood and affect. His behavior is normal.    Results for orders placed or performed during the hospital encounter of 11/21/17 (from the past 48 hour(s))  Lipase, blood     Status: None   Collection Time: 11/21/17  7:44 PM  Result Value Ref Range   Lipase 27 11 - 51 U/L  Comprehensive metabolic panel     Status: Abnormal   Collection Time: 11/21/17  7:44 PM  Result Value Ref Range   Sodium 134 (L) 135 - 145 mmol/L   Potassium 4.1 3.5 - 5.1 mmol/L   Chloride 96 (L) 101 - 111 mmol/L   CO2 29 22 - 32 mmol/L   Glucose, Bld 122 (H) 65 - 99 mg/dL   BUN 12 6 - 20 mg/dL   Creatinine, Ser 1.27 (H) 0.61 - 1.24 mg/dL   Calcium 9.0 8.9 - 10.3 mg/dL   Total Protein 7.5 6.5 - 8.1 g/dL   Albumin 3.2 (L) 3.5 - 5.0 g/dL   AST 34 15 - 41 U/L   ALT 47 17 - 63 U/L   Alkaline Phosphatase 72 38 - 126 U/L   Total Bilirubin 0.8 0.3 - 1.2 mg/dL   GFR calc non Af Amer >60 >60 mL/min   GFR calc Af Amer >60 >60 mL/min    Comment: (NOTE) The eGFR has been calculated using the CKD EPI equation. This  calculation has not been validated in all clinical situations. eGFR's persistently <60 mL/min signify possible Chronic Kidney Disease.    Anion gap 9 5 - 15  CBC     Status: Abnormal   Collection Time: 11/21/17  7:44 PM  Result Value Ref Range   WBC 9.3 4.0 - 10.5 K/uL   RBC 3.88 (L) 4.22 - 5.81 MIL/uL   Hemoglobin 11.5 (L) 13.0 - 17.0 g/dL   HCT 35.3 (L) 39.0 - 52.0 %   MCV 91.0 78.0 - 100.0 fL   MCH 29.6 26.0 - 34.0 pg   MCHC 32.6 30.0 - 36.0 g/dL   RDW 16.0 (H) 11.5 - 15.5 %   Platelets 404 (H) 150 - 400 K/uL  Urinalysis, Routine w reflex microscopic     Status: Abnormal   Collection Time: 11/21/17  9:08 PM  Result Value Ref Range   Color, Urine YELLOW YELLOW   APPearance HAZY (A) CLEAR   Specific Gravity, Urine 1.011 1.005 - 1.030   pH 7.0 5.0 - 8.0   Glucose, UA NEGATIVE NEGATIVE mg/dL   Hgb urine dipstick NEGATIVE NEGATIVE   Bilirubin Urine NEGATIVE NEGATIVE   Ketones, ur NEGATIVE NEGATIVE mg/dL   Protein, ur NEGATIVE NEGATIVE mg/dL   Nitrite NEGATIVE NEGATIVE   Leukocytes, UA NEGATIVE NEGATIVE  Basic metabolic panel     Status: Abnormal   Collection Time: 11/22/17  4:58 AM  Result Value Ref Range   Sodium 134 (L) 135 - 145 mmol/L   Potassium 4.1 3.5 - 5.1 mmol/L   Chloride 100 (L) 101 - 111 mmol/L   CO2 27 22 - 32 mmol/L   Glucose, Bld 117 (H) 65 - 99 mg/dL   BUN 13 6 - 20 mg/dL   Creatinine, Ser 1.20 0.61 - 1.24 mg/dL   Calcium 8.6 (L) 8.9 - 10.3 mg/dL   GFR calc non Af Amer >60 >60 mL/min   GFR calc Af Amer >60 >60 mL/min    Comment: (NOTE) The eGFR has been calculated using the CKD EPI equation. This calculation has not been validated in  all clinical situations. eGFR's persistently <60 mL/min signify possible Chronic Kidney Disease.    Anion gap 7 5 - 15  CBC     Status: Abnormal   Collection Time: 11/22/17  4:58 AM  Result Value Ref Range   WBC 8.5 4.0 - 10.5 K/uL   RBC 3.27 (L) 4.22 - 5.81 MIL/uL   Hemoglobin 9.7 (L) 13.0 - 17.0 g/dL   HCT 29.8 (L)  39.0 - 52.0 %   MCV 91.1 78.0 - 100.0 fL   MCH 29.7 26.0 - 34.0 pg   MCHC 32.6 30.0 - 36.0 g/dL   RDW 16.1 (H) 11.5 - 15.5 %   Platelets 368 150 - 400 K/uL   Ct Abdomen Pelvis W Contrast  Result Date: 11/21/2017 CLINICAL DATA:  Abdominal bloating EXAM: CT ABDOMEN AND PELVIS WITH CONTRAST TECHNIQUE: Multidetector CT imaging of the abdomen and pelvis was performed using the standard protocol following bolus administration of intravenous contrast. CONTRAST:  100 mL Isovue-300 intravenous COMPARISON:  11/14/2017, 09/22/2017, 02/05/2017, 09/04/2016 FINDINGS: Lower chest: Lung bases demonstrate small foci of dependent atelectasis. No acute consolidation or pleural effusion. Normal heart size. Hepatobiliary: No focal hepatic abnormality. Surgical absence of the gallbladder. Stable enlarged extrahepatic common bowel duct. Pancreas: No inflammation.  Stable pancreatic ductal dilatation. Spleen: Normal in size without focal abnormality. Adrenals/Urinary Tract: Adrenal glands are within normal limits. Kidneys show no hydronephrosis. The bladder demonstrates punctate dependent stones or calcifications. Stomach/Bowel: The stomach is nonenlarged. Re- demonstrated multiple dilated fluid-filled loops of small bowel measuring up to 6.8 cm in diameter. Right lower quadrant ostomy. Nondilated small bowel loops near the ostomy in the right hemiabdomen with mucosal enhancement. Re- demonstrated dilated small bowel within a left splayed daily in hernia measuring up to 3 cm. Vascular/Lymphatic: Nonaneurysmal aorta. Re- demonstrated enlarged right lower quadrant lymph nodes measuring up to 14 mm. Retroaortic left renal vein. Reproductive: Re- demonstrated infiltrative prostate mass measuring 6.5 x 4.3 cm with local invasion of structures as before. Other: Negative for free air or significant free fluid. Large left lower quadrant hernia containing mesentery and bowel as before. Musculoskeletal: Re- demonstrated multiple sclerotic  osseous lesions in the pelvis consistent with metastatic disease. IMPRESSION: 1. Continued severe small bowel dilatation/small bowel obstruction without interval improvement since the prior study. No free air. No pneumatosis. Slight decreased mucosal enhancement at the right lower quadrant ostomy, but with residual small bowel loops in the right abdomen near the ostomy demonstrating prominent mucosal enhancement, suspicious for active disease. 2. Grossly stable infiltrative prostate mass with local invasion of adjacent structures. Numerous pelvic skeletal metastatic lesions re- demonstrated. 3. Large left abdominal hernia containing mesentery and bowel ; a slightly enlarged fluid-filled loops of small bowel in the hernia sac appears stable. 4. Re- demonstrated enlarged right lower quadrant lymph nodes. Electronically Signed   By: Donavan Foil M.D.   On: 11/21/2017 22:56    Assessment/Plan Crohn's disease Prostate cancer Ventral hernia Anemia  Small bowel obstruction - admitted to internal medicine service for recurrent SBO. Previous admission for the same problem with d/c 11/26. CT 12/01 not showing interval improvement of small bowel dilation/obstruction from previous CT 11/24. CT also showing mucosal enhancement suspicious for active disease  - NG tube needed, RN having difficulty placing in right nare, attempts x3. Patient states he has had no difficulty with previous NG tube placements. Left nare not attempted   - recommend NG tube placement with fluoroscopy guidance - NPO, bowel rest, IVF - IV pain control  -  senna bid and miralax prn - Zofran PRN for nausea - DVT prophylaxis - SCD's, clear to be on lovenox from our standpoint. - ambulate and IS  No surgical intervention indicated at this time. Recommend a GI consult as well. Needs NG tube placement. Conservative management and we will continue to follow.  Tonny Branch, PA-S2  11/22/2017, 7:53 AM

## 2017-11-22 NOTE — ED Notes (Addendum)
Attempted NG tube 3 times to right nare by 2 RN's.  Each time the tube will advance around 4-5 inches. Very tough to push tube. Once it advances through the back of the nose, the tube starts bowing up in his nose and feels like its hitting a wall and feels like its dragging across something rough. This RN has never felt anything like this when placing NG tubes. Tried multiple times to right nare. Did not want to chance damaging left nare, so that RN/MD could evaluate. Will page MD to make aware.

## 2017-11-22 NOTE — Consult Note (Signed)
Referring Provider: [facility] Primary Care Physician:  Alycia Rossetti, MD Primary Gastroenterologist:  Clent Jacks, MD  Reason for Consultation:  Crohn's disease, small bowel obstructon   Attending physician's note   I have taken a history, examined the patient and reviewed the chart. I agree with the Advanced Practitioner's note, impression and recommendations. Crohn's disease with recurrent SBO. CT shows small bowel obstruction with active disease. S/P ileocolectomy with permanent ostomy. Multiple admissions for SBO. He is followed by Dr. Gala Romney and is maintained on Humira.  IV corticosteroids NGT to [facility] Outpatient mgmt to consider Humira antibody levels, Humira drug levels  Lucio Edward, MD Marval Regal [phone] Mon-Fri 8a-5p [phone] after 5p, weekends, holidays   ASSESSMENT AND PLAN:    13. 61 year old male with long-standing complicated Crohn's disease of large and small bowel.  He is status post ileocolectomy with permanent ostomy.  On Biologics and followed by Dr. Gala Romney.  Multiple admissions for partial small bowel obstructions.  CT scan suggests ongoing high grade obstruction near ileum and active small bowel disease just proximal to the ostomy. IR to place NGT.  Recently tried on budesonide, apparently no improvement per wife. Last Humira was 11/21 -Continue IV steroids -Continue supportive care -check humira antibodies, drug level  2. Metastatic prostate cancer.    HPI: Daniel Howard is a 61 y.o. 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 Gala Romney.  History comes from the patient and records from Dr. Gala Romney and [place]. Patient is s/p remote ileocecectomy, he has a permanent ostomy. Surveillance of remaining colon has been problematic due to rectal stricture. Evaluated at [facility] and a completion colectomy recommended.  Over the years he has been treated with  Imuran, Azathioprine and Lialda.  He started Biologics (Humira) early 2017 and initially did well. He was hospitalized in Sept 2017 for abdominal pain. CT scan suggested high grade obstruction of the ileum just proximal to the ileostomy as well as a rectal mass.  He was seen by [facility] during that admission, Flexible sigmoidoscopy was done.  Pouch was friable, scope was only advanced a 7 cm which was the end of the pouch.  Mild rectal stricture seen. He was recently tried on Budesonide which wife says didn't work. He was readmitted several days ago for two days with another partial SBO.  Improved with bowel rests and IV steroids. Home on prednisone taper.Came back to hospital yesterday with bloating and pain. He has generalized, intermittent crampy abdominal pain. Not eating much. Ostomy output is okay, no recent vomiting (though ED notes says had been vomiting).  CT scan shows continued severe small bowel dilation/obstruction.  Small bowel loops in the right abdomen near the ostomy demonstrating prominent mucosal enhancement suspicious for active disease.  Large left abdominal hernia containing mesentery and bowel a slightly large fluid-filled loop of small bowel in the hernia sac appears stable.  Past Medical History:  Diagnosis Date  . Abnormal finding of biliary tract    MRCP shows pancreatic/biliary tract dilation. EUS 2010 confirmed dilation but no chronic pancreaitis or mass. Vascular ectasia crimpoing distal CBD.   [name] Anxiety   . Cancer ([place])   . Crohn's 1982   initially treated for UC first 9-10 years but at time of exploratory laparotomy with incidental appendectomy in 1992 he was noted to have multiple fistulas involving rectosigmoid colon with sigmoid stricture.s/p transverse loop colostomy secondary to stricture 1992., followed by  end-transverse ostomy, followed by right hemicolectomy, followed  by takedown & ileostomy  . Duodenal ulcer 2010   nsaids  . Low back pain   . Peristomal hernia    . Small bowel obstruction ([place])   . Spigelian hernia    bilateral    Past Surgical History:  Procedure Laterality Date  . APPENDECTOMY  1992   at time of exp laparotomy at which time he was noted to have fistulizing Crohn's rather than UC  . COLONOSCOPY [name] 08/23/2014   ENI:DPOEUMP proctoscopy with possible fistulous opening in thebase of rectal/anal stump.    . COLOSTOMY  1992   transverse loop colostomy secondary to a stricture  . ESOPHAGOGASTRODUODENOSCOPY  05/2009   SLF: multiple antral erosions, large ulcer at ansatomosis (postsurgical changes at duodenal bulb and second portion of duodenum) BX c/x NSAIDS.  . EUS  10/04/2009   Dr. Estill Bakes dilated CBD and main pancreatic duct.  No pancreatic  . FLEXIBLE SIGMOIDOSCOPY  1988   Dr. Laural Golden- suggested rohn's disease but the biopsies were not collaborative.  [name] FLEXIBLE SIGMOIDOSCOPY [name] 09/08/2016   Procedure: FLEXIBLE SIGMOIDOSCOPY;  Surgeon: Wonda Horner, MD;  Location: [facility] ENDOSCOPY;  Service: Gastroenterology;  Laterality: [name];  . HEMICOLOECTOMY W/ ANASTOMOSIS  1993   R- Dr.DeMason   . HERNIA REPAIR  1996   incarcerated periostial hernia with additional surgery in 1999  . IR FLUORO GUIDE PORT INSERTION RIGHT  04/03/2017  . IR US GUIDE VASC ACCESS RIGHT  04/03/2017    Prior to Admission medications   Medication Sig Start Date End Date Taking? Authorizing Provider  Adalimumab (HUMIRA PEN) 40 MG/0.8ML PNKT Inject 40 mg into the skin every 14 (fourteen) days. 12/19/16  Yes Annitta Needs, NP  calcium carbonate (OS-CAL) 600 MG TABS tablet Take 1,200 mg by mouth daily with breakfast.   Yes [provider]  Ferrous Sulfate 28 MG TABS Take 1 tablet by mouth daily.   Yes [provider]  HYDROcodone-acetaminophen (NORCO/VICODIN) 5-325 MG tablet Take 1 tablet by mouth every 6 (six) hours as needed for moderate pain. 09/11/17  Yes Popponesset, [name], MD  lidocaine-prilocaine (EMLA) cream Apply 1 application topically as  needed (when accessing port).   Yes [provider]  mirtazapine (REMERON) 15 MG tablet TAKE 1 TABLET BY MOUTH AT BEDTIME Patient taking differently: TAKE 15 mg TABLET BY MOUTH AT BEDTIME 10/14/17  Yes Solomon, [name], MD  Multiple Vitamins-Minerals (CENTRUM SILVER ADULT 50+) TABS Take 1 tablet by mouth daily.   Yes [provider]  ondansetron (ZOFRAN) 4 MG tablet Take 1 tablet (4 mg total) by mouth every 8 (eight) hours as needed for nausea or vomiting. 11/10/17  Yes Carlis Stable, NP  predniSONE (DELTASONE) 5 MG tablet Label  & dispense according to the schedule below. take 8 Pills PO for 3 days, 6 Pills PO for 3 days, 4 Pills PO for 3 days, 2 Pills PO for 3 days, 1 Pills PO for 3 days, 1/2 Pill  PO for 3 days then STOP. Total 95 pills. 11/16/17  Yes Thurnell Lose, MD  Simethicone (GAS-X PO) Take by mouth as needed.   Yes [provider]  budesonide (ENTOCORT EC) 3 MG 24 hr capsule Take 3 capsules (9 mg total) by mouth daily. Patient not taking: Reported on 11/21/2017 11/10/17   Carlis Stable, NP    Current Facility-Administered Medications  Medication Dose Route Frequency Provider Last Rate Last Dose  . 0.9 %  sodium  chloride infusion  250 mL Intravenous PRN Emokpae, Courage, MD      . acetaminophen (TYLENOL) tablet 650 mg  650 mg Oral Q6H PRN Emokpae, Courage, MD       Or  . acetaminophen (TYLENOL) suppository 650 mg  650 mg Rectal Q6H PRN Emokpae, Courage, MD      . albuterol (PROVENTIL) (2.5 MG/3ML) 0.083% nebulizer solution 2.5 mg  2.5 mg Nebulization Q2H PRN Emokpae, Courage, MD      . calcium carbonate (OS-CAL - dosed in mg of elemental calcium) tablet 1,250 mg  1,250 mg Oral Q breakfast Emokpae, Courage, MD      . dextrose 5 %-0.45 % sodium chloride infusion   Intravenous Continuous Emokpae, Courage, MD 100 mL/hr at 11/22/17 0345    . ferrous sulfate tablet 325 mg  325 mg Oral Daily Emokpae, Courage, MD      . lidocaine-prilocaine (EMLA) cream 1  application  1 application Topical PRN Emokpae, Courage, MD      . methylPREDNISolone sodium succinate (SOLU-MEDROL) 40 mg/mL injection 40 mg  40 mg Intravenous Q8H Emokpae, Courage, MD   40 mg at 11/22/17 0344  . mirtazapine (REMERON) tablet 15 mg  15 mg Oral QHS Emokpae, Courage, MD      . morphine 4 MG/ML injection 2 mg  2 mg Intravenous Q4H PRN Denton Brick, Courage, MD   2 mg at 11/22/17 0344  . multivitamin with minerals tablet 1 tablet  1 tablet Oral Daily Emokpae, Courage, MD      . ondansetron (ZOFRAN) tablet 4 mg  4 mg Oral Q6H PRN Emokpae, Courage, MD       Or  . ondansetron (ZOFRAN) injection 4 mg  4 mg Intravenous Q6H PRN Denton Brick, Courage, MD   4 mg at 11/22/17 0344  . pantoprazole (PROTONIX) injection 40 mg  40 mg Intravenous Q24H Emokpae, Courage, MD      . polyethylene glycol (MIRALAX / GLYCOLAX) packet 17 g  17 g Oral Daily PRN Emokpae, Courage, MD      . senna (SENOKOT) tablet 8.6 mg  1 tablet Oral BID Emokpae, Courage, MD      . sodium chloride flush (NS) 0.9 % injection 3 mL  3 mL Intravenous Q12H Emokpae, Courage, MD   3 mL at 11/22/17 0300  . sodium chloride flush (NS) 0.9 % injection 3 mL  3 mL Intravenous PRN Emokpae, Courage, MD      . traZODone (DESYREL) tablet 50 mg  50 mg Oral QHS PRN Roxan Hockey, MD       Facility-Administered Medications Ordered in Other Encounters  Medication Dose Route Frequency Provider Last Rate Last Dose  . sodium chloride flush (NS) 0.9 % injection 10 mL  10 mL Intravenous PRN [name], MD   10 mL at 11/20/17 5784    Allergies as of 11/21/2017 - Review Complete 11/21/2017  Allergen Reaction Noted  . Penicillins Hives 01/25/2007    Family History  Problem Relation Age of Onset  . Cancer Father        prostate   . Prostate cancer Father   . Colon cancer Father 26  . Hypertension Sister   . Cancer Sister   . Depression Sister   . Breast cancer Sister   . COPD Sister   . Aneurysm Brother        deceased, brain aneurysm     Social History   Socioeconomic History  . Marital status: Married    Spouse name: Not on file  .  Number of children: 49  . Years of education: Not on file  . Highest education level: Not on file  Social Needs  . Financial resource strain: Not on file  . Food insecurity - worry: Not on file  . Food insecurity - inability: Not on file  . Transportation needs - medical: Not on file  . Transportation needs - non-medical: Not on file  Occupational History  . Occupation: disabled    Fish farm manager: UNEMPLOYED  Tobacco Use  . Smoking status: Former Smoker    Packs/day: 0.50    Years: 2.00    Pack years: 1.00    Types: Cigarettes    Last attempt to quit: 12/21/2009    Years since quitting: 7.9  . Smokeless tobacco: Never Used  . Tobacco comment: Quit abut 20 years  Substance and Sexual Activity  . Alcohol use: No    Alcohol/week: 0.0 oz    Comment: Former drinker  . Drug use: No  . Sexual activity: Yes    Partners: Female  Other Topics Concern  . Not on file  Social History Narrative  . Not on file    Review of Systems: All systems reviewed and negative except where noted in HPI.  Physical Exam: Vital signs in last 24 hours: Temp:  [98.6 F (37 C)-99 F (37.2 C)] 98.6 F (37 C) (12/02 0620) Pulse Rate:  [67-97] 68 (12/02 0620) Resp:  [14-16] 16 (12/02 0620) BP: (93-136)/(61-90) 109/73 (12/02 0620) SpO2:  [94 %-100 %] 100 % (12/02 0620) Weight:  [125 lb (56.7 kg)-129 lb (58.5 kg)] 125 lb (56.7 kg) (12/02 0348) Last BM Date: 11/21/17(emptied ostomy before left for ED. Small amount per patient) General:   Alert, well-developed,  Black male in NAD Psych:  Pleasant, cooperative. Normal mood and affect. Eyes:  Pupils equal, sclera clear, no icterus.   Conjunctiva pink. Ears:  Normal auditory acuity. Nose:  No deformity, discharge,  or lesions. Neck:  Supple; no masses Lungs:  Clear throughout to auscultation.   No wheezes, crackles, or rhonchi.  Heart:  Regular rate and  rhythm; no murmurs, no edema Abdomen: Multiple old surgical wounds.  Ostomy in the right abdomen with light brown liquid stool abdomen mildly distended with normal bowel sounds, nontender  Rectal:  Deferred  Msk:  Symmetrical without gross deformities. . Pulses:  Normal pulses noted. Neurologic:  Alert and  oriented x4;  grossly normal neurologically. Skin:  Intact without significant lesions or rashes..   Intake/Output from previous day: 12/01 0701 - 12/02 0700 In: 1125 [I.V.:125; IV Piggyback:1000] Out: 100 [Urine:100] Intake/Output this shift: No intake/output data recorded.  Lab Results: Recent Labs    11/20/17 0931 11/21/17 1944 11/22/17 0458  WBC 10.1 9.3 8.5  HGB 10.6* 11.5* 9.7*  HCT 32.5* 35.3* 29.8*  PLT 377 404* 368   BMET Recent Labs    11/20/17 0931 11/21/17 1944 11/22/17 0458  NA 137 134* 134*  K 3.3* 4.1 4.1  CL  --  96* 100*  CO2 27 29 27   GLUCOSE 99 122* 117*  BUN 16.6 12 13   CREATININE 1.2 1.27* 1.20  CALCIUM 8.9 9.0 8.6*   LFT Recent Labs    11/21/17 1944  PROT 7.5  ALBUMIN 3.2*  AST 34  ALT 47  ALKPHOS 72  BILITOT 0.8    Studies/Results: Ct Abdomen Pelvis W Contrast  Result Date: 11/21/2017 CLINICAL DATA:  Abdominal bloating EXAM: CT ABDOMEN AND PELVIS WITH CONTRAST TECHNIQUE: Multidetector CT imaging of the abdomen and pelvis was  performed using the standard protocol following bolus administration of intravenous contrast. CONTRAST:  100 mL Isovue-300 intravenous COMPARISON:  11/14/2017, 09/22/2017, 02/05/2017, 09/04/2016 FINDINGS: Lower chest: Lung bases demonstrate small foci of dependent atelectasis. No acute consolidation or pleural effusion. Normal heart size. Hepatobiliary: No focal hepatic abnormality. Surgical absence of the gallbladder. Stable enlarged extrahepatic common bowel duct. Pancreas: No inflammation.  Stable pancreatic ductal dilatation. Spleen: Normal in size without focal abnormality. Adrenals/Urinary Tract: Adrenal  glands are within normal limits. Kidneys show no hydronephrosis. The bladder demonstrates punctate dependent stones or calcifications. Stomach/Bowel: The stomach is nonenlarged. Re- demonstrated multiple dilated fluid-filled loops of small bowel measuring up to 6.8 cm in diameter. Right lower quadrant ostomy. Nondilated small bowel loops near the ostomy in the right hemiabdomen with mucosal enhancement. Re- demonstrated dilated small bowel within a left splayed daily in hernia measuring up to 3 cm. Vascular/Lymphatic: Nonaneurysmal aorta. Re- demonstrated enlarged right lower quadrant lymph nodes measuring up to 14 mm. Retroaortic left renal vein. Reproductive: Re- demonstrated infiltrative prostate mass measuring 6.5 x 4.3 cm with local invasion of structures as before. Other: Negative for free air or significant free fluid. Large left lower quadrant hernia containing mesentery and bowel as before. Musculoskeletal: Re- demonstrated multiple sclerotic osseous lesions in the pelvis consistent with metastatic disease. IMPRESSION: 1. Continued severe small bowel dilatation/small bowel obstruction without interval improvement since the prior study. No free air. No pneumatosis. Slight decreased mucosal enhancement at the right lower quadrant ostomy, but with residual small bowel loops in the right abdomen near the ostomy demonstrating prominent mucosal enhancement, suspicious for active disease. 2. Grossly stable infiltrative prostate mass with local invasion of adjacent structures. Numerous pelvic skeletal metastatic lesions re- demonstrated. 3. Large left abdominal hernia containing mesentery and bowel ; a slightly enlarged fluid-filled loops of small bowel in the hernia sac appears stable. 4. Re- demonstrated enlarged right lower quadrant lymph nodes. Electronically Signed   By: Donavan Foil M.D.   On: 11/21/2017 22:56    Tye Savoy, NP-C @  11/22/2017, 10:48 AM Pager number [phone]

## 2017-11-22 NOTE — H&P (Signed)
Patient Demographics:    Daniel Howard, is a 61 y.o. male  MRN: [id number]   DOB - March 30, 1956  Admit Date - 11/21/2017  Outpatient Primary MD for the patient is [name], [name], MD   Assessment & Plan:    Principal Problem:   SBO (small bowel obstruction) [facility]) Active Problems:   Anemia   Ventral hernia   Regional enteritis/Crohn's   Prostate cancer ([place])   Spigelian hernia   Abdominal pain of multiple sites   CT abdomen and pelvis 11/21/17 -iMPRESSION: 1. Continued severe small bowel dilatation/small bowel obstruction without interval improvement since the prior study. No free air. No pneumatosis. Slight decreased mucosal enhancement at the right lower quadrant ostomy, but with residual small bowel loops in the right abdomen near the ostomy demonstrating prominent mucosal enhancement, suspicious for active disease. 2. Grossly stable infiltrative prostate mass with local invasion of adjacent structures. Numerous pelvic skeletal metastatic lesions re- demonstrated. 3. Large left abdominal hernia containing mesentery and bowel ; a slightly enlarged fluid-filled loops of small bowel in the hernia sac appears stable. 4. Re- demonstrated enlarged right lower quadrant lymph nodes.  Plan:-  1)SBO-clinical exam and imaging studies confirmed small bowel obstruction, patient appears to have recurrent small bowel obstruction, official surgical consult from Dr. Grandville Silos pending.  NG tube to be placed, Zofran as needed for nausea vomiting, morphine sulfate IV as needed pain.  N.p.o. except for meds  2)H/o Crohn's Colitis-recent steroid taper, IV Solu-Medrol as prescribed for now, patient gets Humira q 2 wks. patient previously completed ileocolonic Crohn's disease status post subtotal colectomy and multiple  surgeries over the years  3)Metastatic prostate cancer-patient will follow up with discharge with oncologist/urologist for ongoing management   With History of - Reviewed by me  Past Medical History:  Diagnosis Date  . Abnormal finding of biliary tract    MRCP shows pancreatic/biliary tract dilation. EUS 2010 confirmed dilation but no chronic pancreaitis or mass. Vascular ectasia crimpoing distal CBD.   [name] Anxiety   . Cancer ([place])   . 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 Duodenal ulcer 2010   nsaids  . Low back pain   . Peristomal hernia   . Small bowel obstruction ([place])   . Spigelian hernia    bilateral      Past Surgical History:  Procedure Laterality Date  . APPENDECTOMY  1992   at time of exp laparotomy at which time he was noted to have fistulizing Crohn's rather than UC  . COLONOSCOPY [name] 08/23/2014   TRR:NHAFBXU proctoscopy with possible fistulous opening in thebase of rectal/anal stump.    . COLOSTOMY  1992  transverse loop colostomy secondary to a stricture  . ESOPHAGOGASTRODUODENOSCOPY  05/2009   SLF: multiple antral erosions, large ulcer at ansatomosis (postsurgical changes at duodenal bulb and second portion of duodenum) BX c/x NSAIDS.  . EUS  10/04/2009   Dr. Estill Bakes dilated CBD and main pancreatic duct.  No pancreatic  . FLEXIBLE SIGMOIDOSCOPY  1988   Dr. Laural [place]- suggested rohn's disease but the biopsies were not collaborative.  [name] FLEXIBLE SIGMOIDOSCOPY [name] 09/08/2016   Procedure: FLEXIBLE SIGMOIDOSCOPY;  Surgeon: Wonda Horner, MD;  Location: [facility] ENDOSCOPY;  Service: Gastroenterology;  Laterality: [name];  .  HEMICOLOECTOMY W/ ANASTOMOSIS  1993   R- Dr.DeMason   . HERNIA REPAIR  1996   incarcerated periostial hernia with additional surgery in 1999  . IR FLUORO GUIDE PORT INSERTION RIGHT  04/03/2017  . IR US GUIDE VASC ACCESS RIGHT  04/03/2017      Chief Complaint  Patient presents with  . Abdominal Pain      HPI:    Daniel Howard  is a 61 y.o. male  With PMHx crohn disease s/p bowel resection and chronic ileostomy (> 30 yrs ago), prostate cancer with bony mets (last chemo in August undergoing androgen deprivation therapy), b/l spigelian hernia presented with complaints of increased abdominal pain, distention, and nausea.  Patient and his wife are concerned due to decreased output from his ileostomy bag, no fever, no chills, no emesis.  Patient with history of Crohn's colitis, patient previously completed ileocolonic Crohn's disease status post subtotal colectomy and multiple surgeries over the years    Pt presented to ED on 11/14/17 with abdominal pain, abd distension and vomiting, was diagnosed with partial small bowel obstruction that resolved with conservative treatment and Pt was discharged on 11/16/17 after resolution of symptoms, with prednisone taper (chrohn's)  According to patient's wife oral intake has been poor over the last few days  No productive cough no chest pains no palpitations no dizziness no pleuritic symptoms  ED provider discussed this case with on-call general surgeon Dr. Grandville Silos, who advised NG tube and conservative approach for now.  Official surgical consult pending  Abdominal pain improved with IV morphine in the ED      Review of systems:    In addition to the HPI above,   A full 12 point Review of 10 Systems was done, except as stated above, all other Review of 10 Systems were negative.    Social History:  Reviewed by me    Social History   Tobacco Use  . Smoking status: Former Smoker    Packs/day: 0.50    Years: 2.00    Pack years: 1.00     Types: Cigarettes    Last attempt to quit: 12/21/2009    Years since quitting: 7.9  . Smokeless tobacco: Never Used  . Tobacco comment: Quit abut 20 years  Substance Use Topics  . Alcohol use: No    Alcohol/week: 0.0 oz    Comment: Former drinker       Family History :  Reviewed by me    Family History  Problem Relation Age of Onset  . Cancer Father        prostate   . Prostate cancer Father   . Colon cancer Father 45  . Hypertension Sister   . Cancer Sister   . Depression Sister   . Breast cancer Sister   . COPD Sister   . Aneurysm Brother        deceased, brain aneurysm  Home Medications:   Prior to Admission medications   Medication Sig Start Date End Date Taking? Authorizing Provider  Adalimumab (HUMIRA PEN) 40 MG/0.8ML PNKT Inject 40 mg into the skin every 14 (fourteen) days. 12/19/16  Yes Annitta Needs, NP  calcium carbonate (OS-CAL) 600 MG TABS tablet Take 1,200 mg by mouth daily with breakfast.   Yes [provider]  Ferrous Sulfate 28 MG TABS Take 1 tablet by mouth daily.   Yes [provider]  HYDROcodone-acetaminophen (NORCO/VICODIN) 5-325 MG tablet Take 1 tablet by mouth every 6 (six) hours as needed for moderate pain. 09/11/17  Yes [place], [name], MD  lidocaine-prilocaine (EMLA) cream Apply 1 application topically as needed (when accessing port).   Yes [provider]  mirtazapine (REMERON) 15 MG tablet TAKE 1 TABLET BY MOUTH AT BEDTIME Patient taking differently: TAKE 15 mg TABLET BY MOUTH AT BEDTIME 10/14/17  Yes [place], [name], MD  Multiple Vitamins-Minerals (CENTRUM SILVER ADULT 50+) TABS Take 1 tablet by mouth daily.   Yes [provider]  ondansetron (ZOFRAN) 4 MG tablet Take 1 tablet (4 mg total) by mouth every 8 (eight) hours as needed for nausea or vomiting. 11/10/17  Yes Carlis Stable, NP  predniSONE (DELTASONE) 5 MG tablet Label  & dispense according to the schedule below. take 8 Pills PO for 3 days, 6  Pills PO for 3 days, 4 Pills PO for 3 days, 2 Pills PO for 3 days, 1 Pills PO for 3 days, 1/2 Pill  PO for 3 days then STOP. Total 95 pills. 11/16/17  Yes Thurnell Lose, MD  Simethicone (GAS-X PO) Take by mouth as needed.   Yes [provider]  budesonide (ENTOCORT EC) 3 MG 24 hr capsule Take 3 capsules (9 mg total) by mouth daily. Patient not taking: Reported on 11/21/2017 11/10/17   Carlis Stable, NP     Allergies:     Allergies  Allergen Reactions  . Penicillins Hives    Has patient had a PCN reaction causing immediate rash, facial/tongue/throat swelling, SOB or lightheadedness with hypotension: Yes Has patient had a PCN reaction causing severe rash involving mucus membranes or skin necrosis: No Has patient had a PCN reaction that required hospitalization: No Has patient had a PCN reaction occurring within the last 10 years: No If all of the above answers are "NO", then may proceed with Cephalosporin use.      Physical Exam:   Vitals  Blood pressure 108/68, pulse 72, temperature 98.6 F (37 C), temperature source Oral, resp. rate 14, height 5' 5"  (1.651 m), weight 58.5 kg (129 lb), SpO2 96 %.  Physical Examination: General appearance - alert, cachectic appearing, and in no distress  Mental status - alert, oriented to person, place, and time,  Eyes - sclera anicteric Neck - supple, no JVD elevation , Chest - clear  to auscultation bilaterally, symmetrical air movement,  Heart - S1 and S2 normal,  Abdomen -multiple scars from prior laparotomies, soft, generalized tenderness, ileostomy bag with liquid fecal contents, left lower flank/abd with rather large hernia (as per pt and his wife... This large hernia is unchanged from baseline) Neurological - screening mental status exam normal, neck supple without rigidity, cranial nerves II through XII intact, DTR's normal and symmetric Extremities - no pedal edema noted, intact peripheral pulses  Skin - warm, dry Psych-affect  is appropriate   Data Review:    CBC Recent Labs  Lab 11/15/17 0521 11/16/17 0547 11/20/17 0931 11/21/17  1944  WBC 12.5* 14.4* 10.1 9.3  HGB 11.1* 9.0* 10.6* 11.5*  HCT 34.0* 27.0* 32.5* 35.3*  PLT 398 361 377 404*  MCV 89.2 88.5 91.0 91.0  MCH 29.1 29.5 29.7 29.6  MCHC 32.6 33.3 32.6 32.6  RDW 14.5 14.5 15.3* 16.0*  LYMPHSABS  --  1.1 3.5*  --   MONOABS  --  0.7 0.9  --   EOSABS  --  0.0 0.1  --   BASOSABS  --  0.0 0.0  --    ------------------------------------------------------------------------------------------------------------------  Chemistries  Recent Labs  Lab 11/15/17 0521 11/16/17 0547 11/20/17 0931 11/21/17 1944  NA 133* 134* 137 134*  K 5.1 4.6 3.3* 4.1  CL 92* 103  --  96*  CO2 29 28 27 29   GLUCOSE 114* 130* 99 122*  BUN 20 18 16.6 12  CREATININE 1.28* 1.08 1.2 1.27*  CALCIUM 8.9 8.3* 8.9 9.0  AST 20 18 38* 34  ALT 15* 12* 45 47  ALKPHOS 70 51 62 72  BILITOT 1.7* 1.0 0.66 0.8   ------------------------------------------------------------------------------------------------------------------ estimated creatinine clearance is 51.8 mL/min (A) (by C-G formula based on SCr of 1.27 mg/dL (H)). ------------------------------------------------------------------------------------------------------------------ No results for input(s): TSH, T4TOTAL, T3FREE, THYROIDAB in the last 72 hours.  Invalid input(s): FREET3   Coagulation profile No results for input(s): INR, PROTIME in the last 168 hours. ------------------------------------------------------------------------------------------------------------------- No results for input(s): DDIMER in the last 72 hours. -------------------------------------------------------------------------------------------------------------------  Cardiac Enzymes No results for input(s): CKMB, TROPONINI, MYOGLOBIN in the last 168 hours.  Invalid input(s):  CK ------------------------------------------------------------------------------------------------------------------ No results found for: BNP   ---------------------------------------------------------------------------------------------------------------  Urinalysis    Component Value Date/Time   COLORURINE YELLOW 11/21/2017 2108   APPEARANCEUR HAZY (A) 11/21/2017 2108   LABSPEC 1.011 11/21/2017 2108   PHURINE 7.0 11/21/2017 2108   GLUCOSEU NEGATIVE 11/21/2017 2108   HGBUR NEGATIVE 11/21/2017 2108   BILIRUBINUR NEGATIVE 11/21/2017 2108   KETONESUR NEGATIVE 11/21/2017 2108   PROTEINUR NEGATIVE 11/21/2017 2108   UROBILINOGEN 0.2 10/10/2014 1037   NITRITE NEGATIVE 11/21/2017 2108   LEUKOCYTESUR NEGATIVE 11/21/2017 2108    ----------------------------------------------------------------------------------------------------------------   Imaging Results:    Ct Abdomen Pelvis W Contrast  Result Date: 11/21/2017 CLINICAL DATA:  Abdominal bloating EXAM: CT ABDOMEN AND PELVIS WITH CONTRAST TECHNIQUE: Multidetector CT imaging of the abdomen and pelvis was performed using the standard protocol following bolus administration of intravenous contrast. CONTRAST:  100 mL Isovue-300 intravenous COMPARISON:  11/14/2017, 09/22/2017, 02/05/2017, 09/04/2016 FINDINGS: Lower chest: Lung bases demonstrate small foci of dependent atelectasis. No acute consolidation or pleural effusion. Normal heart size. Hepatobiliary: No focal hepatic abnormality. Surgical absence of the gallbladder. Stable enlarged extrahepatic common bowel duct. Pancreas: No inflammation.  Stable pancreatic ductal dilatation. Spleen: Normal in size without focal abnormality. Adrenals/Urinary Tract: Adrenal glands are within normal limits. Kidneys show no hydronephrosis. The bladder demonstrates punctate dependent stones or calcifications. Stomach/Bowel: The stomach is nonenlarged. Re- demonstrated multiple dilated fluid-filled loops of  small bowel measuring up to 6.8 cm in diameter. Right lower quadrant ostomy. Nondilated small bowel loops near the ostomy in the right hemiabdomen with mucosal enhancement. Re- demonstrated dilated small bowel within a left splayed daily in hernia measuring up to 3 cm. Vascular/Lymphatic: Nonaneurysmal aorta. Re- demonstrated enlarged right lower quadrant lymph nodes measuring up to 14 mm. Retroaortic left renal vein. Reproductive: Re- demonstrated infiltrative prostate mass measuring 6.5 x 4.3 cm with local invasion of structures as before. Other: Negative for free air or significant free fluid. Large left lower quadrant  hernia containing mesentery and bowel as before. Musculoskeletal: Re- demonstrated multiple sclerotic osseous lesions in the pelvis consistent with metastatic disease. IMPRESSION: 1. Continued severe small bowel dilatation/small bowel obstruction without interval improvement since the prior study. No free air. No pneumatosis. Slight decreased mucosal enhancement at the right lower quadrant ostomy, but with residual small bowel loops in the right abdomen near the ostomy demonstrating prominent mucosal enhancement, suspicious for active disease. 2. Grossly stable infiltrative prostate mass with local invasion of adjacent structures. Numerous pelvic skeletal metastatic lesions re- demonstrated. 3. Large left abdominal hernia containing mesentery and bowel ; a slightly enlarged fluid-filled loops of small bowel in the hernia sac appears stable. 4. Re- demonstrated enlarged right lower quadrant lymph nodes. Electronically Signed   By: Donavan Foil M.D.   On: 11/21/2017 22:56    Radiological Exams on Admission: Ct Abdomen Pelvis W Contrast  Result Date: 11/21/2017 CLINICAL DATA:  Abdominal bloating EXAM: CT ABDOMEN AND PELVIS WITH CONTRAST TECHNIQUE: Multidetector CT imaging of the abdomen and pelvis was performed using the standard protocol following bolus administration of intravenous contrast.  CONTRAST:  100 mL Isovue-300 intravenous COMPARISON:  11/14/2017, 09/22/2017, 02/05/2017, 09/04/2016 FINDINGS: Lower chest: Lung bases demonstrate small foci of dependent atelectasis. No acute consolidation or pleural effusion. Normal heart size. Hepatobiliary: No focal hepatic abnormality. Surgical absence of the gallbladder. Stable enlarged extrahepatic common bowel duct. Pancreas: No inflammation.  Stable pancreatic ductal dilatation. Spleen: Normal in size without focal abnormality. Adrenals/Urinary Tract: Adrenal glands are within normal limits. Kidneys show no hydronephrosis. The bladder demonstrates punctate dependent stones or calcifications. Stomach/Bowel: The stomach is nonenlarged. Re- demonstrated multiple dilated fluid-filled loops of small bowel measuring up to 6.8 cm in diameter. Right lower quadrant ostomy. Nondilated small bowel loops near the ostomy in the right hemiabdomen with mucosal enhancement. Re- demonstrated dilated small bowel within a left splayed daily in hernia measuring up to 3 cm. Vascular/Lymphatic: Nonaneurysmal aorta. Re- demonstrated enlarged right lower quadrant lymph nodes measuring up to 14 mm. Retroaortic left renal vein. Reproductive: Re- demonstrated infiltrative prostate mass measuring 6.5 x 4.3 cm with local invasion of structures as before. Other: Negative for free air or significant free fluid. Large left lower quadrant hernia containing mesentery and bowel as before. Musculoskeletal: Re- demonstrated multiple sclerotic osseous lesions in the pelvis consistent with metastatic disease. IMPRESSION: 1. Continued severe small bowel dilatation/small bowel obstruction without interval improvement since the prior study. No free air. No pneumatosis. Slight decreased mucosal enhancement at the right lower quadrant ostomy, but with residual small bowel loops in the right abdomen near the ostomy demonstrating prominent mucosal enhancement, suspicious for active disease. 2. Grossly  stable infiltrative prostate mass with local invasion of adjacent structures. Numerous pelvic skeletal metastatic lesions re- demonstrated. 3. Large left abdominal hernia containing mesentery and bowel ; a slightly enlarged fluid-filled loops of small bowel in the hernia sac appears stable. 4. Re- demonstrated enlarged right lower quadrant lymph nodes. Electronically Signed   By: Donavan Foil M.D.   On: 11/21/2017 22:56    DVT Prophylaxis -SCD   AM Labs Ordered, also please review Full Orders  Family Communication: Admission, patients condition and plan of care including tests being ordered have been discussed with the patient and wife who indicate understanding and agree with the plan   Code Status - Full Code  Likely DC to  home  Condition   stable  Roxan Hockey M.D on 11/22/2017 at 2:06 AM   Between 7am to 7pm - Pager -  [phone] After 7pm go to [email or web address] - password [email or web address]  [facility] - Office  [phone]  Voice Recognition Viviann Spare dictation system was used to create this note, attempts have been made to correct errors. Please contact the author with questions and/or clarifications.

## 2017-11-22 NOTE — Progress Notes (Signed)
PROGRESS NOTE   Daniel Howard  GYK:[id number]    DOB: 1956/08/08    DOA: 11/21/2017  PCP: Alycia Rossetti, MD   I have briefly reviewed patients previous medical records in [facility].  Brief Narrative:  61 year old male with PMH of Crohn's disease, status post ileocolectomy and permanent ostomy, followed by Dr. Berton Bon, GI, recurrent SBO's, prostate cancer with bone metastases (last chemotherapy in August undergoing androgen deprivation therapy), presented with another bout of SBO. General surgery and [facility] consulted.   Assessment & Plan:   Principal Problem:   SBO (small bowel obstruction) (HCC) Active Problems:   Anemia   Ventral hernia   Regional enteritis/Crohn's   Prostate cancer ([place])   Spigelian hernia   Abdominal pain of multiple sites   1. Partial small bowel obstruction, recurrent: Gen. surgery consultation appreciated. ED RN had difficulty placing NG tube after multiple attempts. NG tube to be placed by IR under fluoroscopy. Nothing by mouth, IV fluids, mobilize. No surgical intervention indicated at this time. 2. Crohn's disease with enteritis: [facility] input appreciated. IV Solu-Medrol 40 mg every 12 hours. He is also on Humira as outpatient. Check Humira drug and antibody levels as outpatient. 3. Metastatic prostate cancer: Outpatient follow-up with oncologist/urologist. 4. Anemia of chronic disease: Stable. Follow CBCs.   DVT prophylaxis: SCDs Code Status: Full Family Communication: Discussed in detail with spouse at bedside. Disposition: DC home when medically improved.   Consultants:  General surgery [facility]  IR  Procedures:  NG tube placed by IR on 12/2.  Antimicrobials:  None    Subjective: Seen this morning. Feels better. Abdominal distention and pain have improved. Ostomy draining but less than usual. Denies blood in stools. Poor appetite for one week but no nausea or vomiting.  ROS: As above  Objective:  Vitals:   11/22/17 0230 11/22/17 0245 11/22/17 0348 11/22/17 0620  BP: 136/85 133/90 127/78 109/73  Pulse: 74 71 68 68  Resp:   16 16  Temp:   99 F (37.2 C) 98.6 F (37 C)  TempSrc:   Oral Oral  SpO2: 97% 97% 100% 100%  Weight:   56.7 kg (125 lb)   Height:   5' 4.5" (1.638 m)     Examination:  General exam: Middle-aged male, moderately built and thinly nourished, lying comfortably supine in bed. Respiratory system: Clear to auscultation. Respiratory effort normal. Cardiovascular system: S1 & S2 heard, RRR. No JVD, murmurs, rubs, gallops or clicks. No pedal edema. Gastrointestinal system: Abdomen is nondistended, soft and nontender. No organomegaly or masses felt. Normal bowel sounds heard. Midline laparotomy scar healed by secondary intention. RLQ ostomy draining liquid green stools. No blood. Central nervous system: Alert and oriented. No focal neurological deficits. Extremities: Symmetric 5 x 5 power. Skin: No rashes, lesions or ulcers Psychiatry: Judgement and insight appear normal. Mood & affect appropriate.     Data Reviewed: I have personally reviewed following labs and imaging studies  CBC: Recent Labs  Lab 11/16/17 0547 11/20/17 0931 11/21/17 1944 11/22/17 0458  WBC 14.4* 10.1 9.3 8.5  NEUTROABS 12.7* 5.6  --   --   HGB 9.0* 10.6* 11.5* 9.7*  HCT 27.0* 32.5* 35.3* 29.8*  MCV 88.5 91.0 91.0 91.1  PLT 361 377 404* 793   Basic Metabolic Panel: Recent Labs  Lab 11/16/17 0547 11/20/17 0931 11/21/17 1944 11/22/17 0458  NA 134* 137 134* 134*  K 4.6 3.3* 4.1 4.1  CL 103  --  96* 100*  CO2  28 27 29 27   GLUCOSE 130* 99 122* 117*  BUN 18 16.6 12 13   CREATININE 1.08 1.2 1.27* 1.20  CALCIUM 8.3* 8.9 9.0 8.6*   Liver Function Tests: Recent Labs  Lab 11/16/17 0547 11/20/17 0931 11/21/17 1944  AST 18 38* 34  ALT 12* 45 47  ALKPHOS 51 62 72  BILITOT 1.0 0.66 0.8  PROT 6.0* 7.5 7.5  ALBUMIN 2.5* 3.1* 3.2*     Radiology Studies: Ct Abdomen Pelvis W  Contrast  Result Date: 11/21/2017 CLINICAL DATA:  Abdominal bloating EXAM: CT ABDOMEN AND PELVIS WITH CONTRAST TECHNIQUE: Multidetector CT imaging of the abdomen and pelvis was performed using the standard protocol following bolus administration of intravenous contrast. CONTRAST:  100 mL Isovue-300 intravenous COMPARISON:  11/14/2017, 09/22/2017, 02/05/2017, 09/04/2016 FINDINGS: Lower chest: Lung bases demonstrate small foci of dependent atelectasis. No acute consolidation or pleural effusion. Normal heart size. Hepatobiliary: No focal hepatic abnormality. Surgical absence of the gallbladder. Stable enlarged extrahepatic common bowel duct. Pancreas: No inflammation.  Stable pancreatic ductal dilatation. Spleen: Normal in size without focal abnormality. Adrenals/Urinary Tract: Adrenal glands are within normal limits. Kidneys show no hydronephrosis. The bladder demonstrates punctate dependent stones or calcifications. Stomach/Bowel: The stomach is nonenlarged. Re- demonstrated multiple dilated fluid-filled loops of small bowel measuring up to 6.8 cm in diameter. Right lower quadrant ostomy. Nondilated small bowel loops near the ostomy in the right hemiabdomen with mucosal enhancement. Re- demonstrated dilated small bowel within a left splayed daily in hernia measuring up to 3 cm. Vascular/Lymphatic: Nonaneurysmal aorta. Re- demonstrated enlarged right lower quadrant lymph nodes measuring up to 14 mm. Retroaortic left renal vein. Reproductive: Re- demonstrated infiltrative prostate mass measuring 6.5 x 4.3 cm with local invasion of structures as before. Other: Negative for free air or significant free fluid. Large left lower quadrant hernia containing mesentery and bowel as before. Musculoskeletal: Re- demonstrated multiple sclerotic osseous lesions in the pelvis consistent with metastatic disease. IMPRESSION: 1. Continued severe small bowel dilatation/small bowel obstruction without interval improvement since the  prior study. No free air. No pneumatosis. Slight decreased mucosal enhancement at the right lower quadrant ostomy, but with residual small bowel loops in the right abdomen near the ostomy demonstrating prominent mucosal enhancement, suspicious for active disease. 2. Grossly stable infiltrative prostate mass with local invasion of adjacent structures. Numerous pelvic skeletal metastatic lesions re- demonstrated. 3. Large left abdominal hernia containing mesentery and bowel ; a slightly enlarged fluid-filled loops of small bowel in the hernia sac appears stable. 4. Re- demonstrated enlarged right lower quadrant lymph nodes. Electronically Signed   By: Donavan Foil M.D.   On: 11/21/2017 22:56   Dg Addison Bailey G Tube Plc W/fl W/rad  Result Date: 11/22/2017 CLINICAL DATA:  NG tube placement EXAM: NASO G TUBE PLACEMENT WITH FL AND WITH RAD CONTRAST:  None. FLUOROSCOPY TIME:  Fluoroscopy Time:  1 minutes and 0 seconds. Radiation Exposure Index (if provided by the fluoroscopic device): 5.7 mGy Number of Acquired Spot Images: . COMPARISON:  CT scan from 11/21/2017 was reviewed. FINDINGS: Viscous lidocaine was placed in the patient's left nostril. 37 French NG tube was lubricated with water-soluble lubricant and passed readily down the esophagus into the stomach. NG tube was placed to suction and a small amount of brownish fluid was aspirated. Patient tolerated procedure well. NG tube was fixed in place with pink tape. IMPRESSION: 77 French NG tube placed with the tip in the mid stomach, confirmed by fluoroscopic spot image. Electronically Signed   By: Randall Hiss  Tery Sanfilippo M.D.   On: 11/22/2017 13:11        Scheduled Meds: . calcium carbonate  1,250 mg Oral Q breakfast  . ferrous sulfate  325 mg Oral Daily  . methylPREDNISolone (SOLU-MEDROL) injection  40 mg Intravenous Q8H  . mirtazapine  15 mg Oral QHS  . multivitamin with minerals  1 tablet Oral Daily  . pantoprazole (PROTONIX) IV  40 mg Intravenous Q24H  . senna  1  tablet Oral BID  . sodium chloride flush  3 mL Intravenous Q12H   Continuous Infusions: . sodium chloride    . dextrose 5 % and 0.45% NaCl 100 mL/hr at 11/22/17 1257     LOS: 0 days     Vernell Leep, MD, [place], [facility]. [facility] Pager [phone]  If 7PM-7AM, please contact night-coverage [email or web address] Password [email or web address] 11/22/2017, 2:17 PM

## 2017-11-23 ENCOUNTER — Inpatient Hospital Stay (HOSPITAL_COMMUNITY): Payer: Medicare Other

## 2017-11-23 DIAGNOSIS — D530 Protein deficiency anemia: Secondary | ICD-10-CM

## 2017-11-23 DIAGNOSIS — N179 Acute kidney failure, unspecified: Secondary | ICD-10-CM

## 2017-11-23 LAB — BASIC METABOLIC PANEL
Anion gap: 4 — ABNORMAL LOW (ref 5–15)
BUN: 11 mg/dL (ref 6–20)
CO2: 29 mmol/L (ref 22–32)
Calcium: 8.6 mg/dL — ABNORMAL LOW (ref 8.9–10.3)
Chloride: 104 mmol/L (ref 101–111)
Creatinine, Ser: 1.3 mg/dL — ABNORMAL HIGH (ref 0.61–1.24)
GFR calc Af Amer: >60 mL/min (ref >60)
GFR, EST NON AFRICAN AMERICAN: 59 mL/min — AB (ref >60)
GLUCOSE: 127 mg/dL — AB (ref 65–99)
POTASSIUM: 4.8 mmol/L (ref 3.5–5.1)
Sodium: 137 mmol/L (ref 135–145)

## 2017-11-23 LAB — CBC
HCT: 31.8 % — ABNORMAL LOW (ref 39.0–52.0)
Hemoglobin: 10.4 g/dL — ABNORMAL LOW (ref 13.0–17.0)
MCH: 29.6 pg (ref 26.0–34.0)
MCHC: 32.7 g/dL (ref 30.0–36.0)
MCV: 90.6 fL (ref 78.0–100.0)
PLATELETS: 365 10*3/uL (ref 150–400)
RBC: 3.51 MIL/uL — AB (ref 4.22–5.81)
RDW: 15.7 % — AB (ref 11.5–15.5)
WBC: 7.9 10*3/uL (ref 4.0–10.5)

## 2017-11-23 MED ORDER — METHYLPREDNISOLONE SODIUM SUCC 40 MG IJ SOLR
40.0000 mg | Freq: Two times a day (BID) | INTRAMUSCULAR | Status: DC
  Administered 2017-11-23 – 2017-11-25 (×4): 40 mg via INTRAVENOUS
  Filled 2017-11-23 (×4): qty 1

## 2017-11-23 MED ORDER — DEXTROSE-NACL 5-0.45 % IV SOLN
INTRAVENOUS | Status: AC
  Administered 2017-11-23 – 2017-11-24 (×3): via INTRAVENOUS

## 2017-11-23 NOTE — Progress Notes (Signed)
PROGRESS NOTE   Daniel Howard  QPY:[id number]    DOB: 1956-11-06    DOA: 11/21/2017  PCP: Alycia Rossetti, MD   I have briefly reviewed patients previous medical records in [facility].  Brief Narrative:  61 year old male with PMH of Crohn's disease, status post ileocolectomy and permanent ostomy, followed by Dr. Berton Bon, GI, recurrent SBO's, prostate cancer with bone metastases (last chemotherapy in August undergoing androgen deprivation therapy), presented with another bout of SBO. General surgery and [facility] consulted. NG tube decompression. Improving.   Assessment & Plan:   Principal Problem:   SBO (small bowel obstruction) (HCC) Active Problems:   Anemia   Ventral hernia   Regional enteritis/Crohn's   Prostate cancer ([place])   Spigelian hernia   Abdominal pain of multiple sites   1. Partial small bowel obstruction, recurrent: Gen. surgery follow-up appreciated. NG tube placed by IR under fluoroscopy 12/2. Significantly improved after NPO, NGT decompression and IV fluids. Increased ostomy output. KUB shows improvement. Discussed with CCS, continue NG tube for additional day and consider removing 12/4 and advancing diet. 2. Crohn's disease with enteritis: IV Solu-Medrol 40 mg every 12 hours. He is also on Humira as outpatient. Check Humira drug and antibody levels as outpatient and consider increasing the dose versus changing to a different biological agent. [name] follow-up appreciated. 3. Metastatic prostate cancer: Outpatient follow-up with oncologist/urologist. 4. Anemia of chronic disease: Stable. Follow CBCs. 5. Large abdominal wall hernia: Contributing to SBO in addition to adhesive disease. 6. Mild acute kidney injury: Creatinine was normal on 11/26. Now 1.3. Likely secondary to GI losses and poor oral intake. Increase IV fluids and follow BMP.   DVT prophylaxis: SCDs Code Status: Full Family Communication: None at bedside. Disposition: DC home when medically  improved, possibly in 2-3 days..   Consultants:  General surgery [facility]  IR  Procedures:  NG tube placed by IR on 12/2.  Antimicrobials:  None    Subjective: Feels much better. Abdomen not distended anymore. Increased output through ostomy. No abdominal pain, nausea or vomiting reported.  ROS: As above  Objective:  Vitals:   11/22/17 1445 11/22/17 2122 11/23/17 0434 11/23/17 1300  BP: 107/66 111/69 101/65 109/61  Pulse: 63 (!) 52 (!) 51 (!) 52  Resp: 16 16 16 16   Temp: 98.6 F (37 C) 98.2 F (36.8 C) 97.8 F (36.6 C) (!) 97.1 F (36.2 C)  TempSrc: Oral Oral Oral Oral  SpO2: 100% 98% 99% 98%  Weight:      Height:        Examination:  General exam: Middle-aged male, moderately built and thinly nourished, lying comfortably supine in bed. Appears in good spirits and in no distress. Respiratory system: Clear to auscultation. Respiratory effort normal. Stable without change. Cardiovascular system: S1 & S2 heard, RRR. No JVD, murmurs, rubs, gallops or clicks. No pedal edema. Stable without change. Gastrointestinal system: Abdomen is nondistended, soft and nontender. No organomegaly or masses felt. Normal bowel sounds heard on my exam. Midline laparotomy scar healed by secondary intention. RLQ ostomy draining copious liquid green stools. No blood. Improved. Central nervous system: Alert and oriented. No focal neurological deficits. Stable without change Extremities: Symmetric 5 x 5 power. Stable. Skin: No rashes, lesions or ulcers Psychiatry: Judgement and insight appear normal. Mood & affect appropriate.     Data Reviewed: I have personally reviewed following labs and imaging studies  CBC: Recent Labs  Lab 11/20/17 0931 11/21/17 1944 11/22/17 0458 11/23/17 0356  WBC 10.1 9.3 8.5 7.9  NEUTROABS 5.6  --   --   --   HGB 10.6* 11.5* 9.7* 10.4*  HCT 32.5* 35.3* 29.8* 31.8*  MCV 91.0 91.0 91.1 90.6  PLT 377 404* 368 812   Basic Metabolic Panel: Recent Labs    Lab 11/20/17 0931 11/21/17 1944 11/22/17 0458 11/23/17 0356  NA 137 134* 134* 137  K 3.3* 4.1 4.1 4.8  CL  --  96* 100* 104  CO2 27 29 27 29   GLUCOSE 99 122* 117* 127*  BUN 16.6 12 13 11   CREATININE 1.2 1.27* 1.20 1.30*  CALCIUM 8.9 9.0 8.6* 8.6*   Liver Function Tests: Recent Labs  Lab 11/20/17 0931 11/21/17 1944  AST 38* 34  ALT 45 47  ALKPHOS 62 72  BILITOT 0.66 0.8  PROT 7.5 7.5  ALBUMIN 3.1* 3.2*     Radiology Studies: Ct Abdomen Pelvis W Contrast  Result Date: 11/21/2017 CLINICAL DATA:  Abdominal bloating EXAM: CT ABDOMEN AND PELVIS WITH CONTRAST TECHNIQUE: Multidetector CT imaging of the abdomen and pelvis was performed using the standard protocol following bolus administration of intravenous contrast. CONTRAST:  100 mL Isovue-300 intravenous COMPARISON:  11/14/2017, 09/22/2017, 02/05/2017, 09/04/2016 FINDINGS: Lower chest: Lung bases demonstrate small foci of dependent atelectasis. No acute consolidation or pleural effusion. Normal heart size. Hepatobiliary: No focal hepatic abnormality. Surgical absence of the gallbladder. Stable enlarged extrahepatic common bowel duct. Pancreas: No inflammation.  Stable pancreatic ductal dilatation. Spleen: Normal in size without focal abnormality. Adrenals/Urinary Tract: Adrenal glands are within normal limits. Kidneys show no hydronephrosis. The bladder demonstrates punctate dependent stones or calcifications. Stomach/Bowel: The stomach is nonenlarged. Re- demonstrated multiple dilated fluid-filled loops of small bowel measuring up to 6.8 cm in diameter. Right lower quadrant ostomy. Nondilated small bowel loops near the ostomy in the right hemiabdomen with mucosal enhancement. Re- demonstrated dilated small bowel within a left splayed daily in hernia measuring up to 3 cm. Vascular/Lymphatic: Nonaneurysmal aorta. Re- demonstrated enlarged right lower quadrant lymph nodes measuring up to 14 mm. Retroaortic left renal vein. Reproductive: Re-  demonstrated infiltrative prostate mass measuring 6.5 x 4.3 cm with local invasion of structures as before. Other: Negative for free air or significant free fluid. Large left lower quadrant hernia containing mesentery and bowel as before. Musculoskeletal: Re- demonstrated multiple sclerotic osseous lesions in the pelvis consistent with metastatic disease. IMPRESSION: 1. Continued severe small bowel dilatation/small bowel obstruction without interval improvement since the prior study. No free air. No pneumatosis. Slight decreased mucosal enhancement at the right lower quadrant ostomy, but with residual small bowel loops in the right abdomen near the ostomy demonstrating prominent mucosal enhancement, suspicious for active disease. 2. Grossly stable infiltrative prostate mass with local invasion of adjacent structures. Numerous pelvic skeletal metastatic lesions re- demonstrated. 3. Large left abdominal hernia containing mesentery and bowel ; a slightly enlarged fluid-filled loops of small bowel in the hernia sac appears stable. 4. Re- demonstrated enlarged right lower quadrant lymph nodes. Electronically Signed   By: Donavan Foil M.D.   On: 11/21/2017 22:56   Dg Abd Portable 1v  Result Date: 11/23/2017 CLINICAL DATA:  Small bowel obstruction. EXAM: PORTABLE ABDOMEN - 1 VIEW COMPARISON:  One-view abdomen 11/22/2017. CT of the abdomen pelvis 11/21/2017. FINDINGS: NG tube is in place. The bowel is now decompressed. Mild dilation remains in the right upper quadrant without obstruction. There is no free air. Right lower quadrant hernia repair is noted. IMPRESSION: 1. Significant decompression of bowel with NG tube  in place. 2. Minimal residual dilated small bowel in the right upper quadrant. Electronically Signed   By: San Morelle M.D.   On: 11/23/2017 07:49   Dg Addison Bailey G Tube Plc W/fl W/rad  Result Date: 11/22/2017 CLINICAL DATA:  NG tube placement EXAM: NASO G TUBE PLACEMENT WITH FL AND WITH RAD  CONTRAST:  None. FLUOROSCOPY TIME:  Fluoroscopy Time:  1 minutes and 0 seconds. Radiation Exposure Index (if provided by the fluoroscopic device): 5.7 mGy Number of Acquired Spot Images: . COMPARISON:  CT scan from 11/21/2017 was reviewed. FINDINGS: Viscous lidocaine was placed in the patient's left nostril. 41 French NG tube was lubricated with water-soluble lubricant and passed readily down the esophagus into the stomach. NG tube was placed to suction and a small amount of brownish fluid was aspirated. Patient tolerated procedure well. NG tube was fixed in place with pink tape. IMPRESSION: 36 French NG tube placed with the tip in the mid stomach, confirmed by fluoroscopic spot image. Electronically Signed   By: Misty Stanley M.D.   On: 11/22/2017 13:11        Scheduled Meds: . calcium carbonate  1,250 mg Oral Q breakfast  . ferrous sulfate  325 mg Oral Daily  . methylPREDNISolone (SOLU-MEDROL) injection  40 mg Intravenous Q12H  . mirtazapine  15 mg Oral QHS  . multivitamin with minerals  1 tablet Oral Daily  . pantoprazole (PROTONIX) IV  40 mg Intravenous Q24H  . senna  1 tablet Oral BID  . sodium chloride flush  3 mL Intravenous Q12H   Continuous Infusions: . sodium chloride    . dextrose 5 % and 0.45% NaCl 100 mL/hr at 11/23/17 0454     LOS: 1 day     Vernell Leep, MD, [place], [facility]. [facility] Pager [phone]  If 7PM-7AM, please contact night-coverage [email or web address] Password [email or web address] 11/23/2017, 3:38 PM

## 2017-11-23 NOTE — Progress Notes (Signed)
Subjective/Chief Complaint: Feels much better  Ostomy output high    Objective: Vital signs in last 24 hours: Temp:  [97.8 F (36.6 C)-98.6 F (37 C)] 97.8 F (36.6 C) (12/03 0434) Pulse Rate:  [51-63] 51 (12/03 0434) Resp:  [16] 16 (12/03 0434) BP: (101-111)/(65-69) 101/65 (12/03 0434) SpO2:  [98 %-100 %] 99 % (12/03 0434) Last BM Date: 11/23/17  Intake/Output from previous day: 12/02 0701 - 12/03 0700 In: 2500 [I.V.:2500] Out: 2700 [Urine:1225; Emesis/NG output:450; Stool:1025] Intake/Output this shift: No intake/output data recorded.  GI: ND NT ostomy RLQ viable and bag full   Lab Results:  Recent Labs    11/22/17 0458 11/23/17 0356  WBC 8.5 7.9  HGB 9.7* 10.4*  HCT 29.8* 31.8*  PLT 368 365   BMET Recent Labs    11/22/17 0458 11/23/17 0356  NA 134* 137  K 4.1 4.8  CL 100* 104  CO2 27 29  GLUCOSE 117* 127*  BUN 13 11  CREATININE 1.20 1.30*  CALCIUM 8.6* 8.6*   PT/INR No results for input(s): LABPROT, INR in the last 72 hours. ABG No results for input(s): PHART, HCO3 in the last 72 hours.  Invalid input(s): PCO2, PO2  Studies/Results: Ct Abdomen Pelvis W Contrast  Result Date: 11/21/2017 CLINICAL DATA:  Abdominal bloating EXAM: CT ABDOMEN AND PELVIS WITH CONTRAST TECHNIQUE: Multidetector CT imaging of the abdomen and pelvis was performed using the standard protocol following bolus administration of intravenous contrast. CONTRAST:  100 mL Isovue-300 intravenous COMPARISON:  11/14/2017, 09/22/2017, 02/05/2017, 09/04/2016 FINDINGS: Lower chest: Lung bases demonstrate small foci of dependent atelectasis. No acute consolidation or pleural effusion. Normal heart size. Hepatobiliary: No focal hepatic abnormality. Surgical absence of the gallbladder. Stable enlarged extrahepatic common bowel duct. Pancreas: No inflammation.  Stable pancreatic ductal dilatation. Spleen: Normal in size without focal abnormality. Adrenals/Urinary Tract: Adrenal glands are  within normal limits. Kidneys show no hydronephrosis. The bladder demonstrates punctate dependent stones or calcifications. Stomach/Bowel: The stomach is nonenlarged. Re- demonstrated multiple dilated fluid-filled loops of small bowel measuring up to 6.8 cm in diameter. Right lower quadrant ostomy. Nondilated small bowel loops near the ostomy in the right hemiabdomen with mucosal enhancement. Re- demonstrated dilated small bowel within a left splayed daily in hernia measuring up to 3 cm. Vascular/Lymphatic: Nonaneurysmal aorta. Re- demonstrated enlarged right lower quadrant lymph nodes measuring up to 14 mm. Retroaortic left renal vein. Reproductive: Re- demonstrated infiltrative prostate mass measuring 6.5 x 4.3 cm with local invasion of structures as before. Other: Negative for free air or significant free fluid. Large left lower quadrant hernia containing mesentery and bowel as before. Musculoskeletal: Re- demonstrated multiple sclerotic osseous lesions in the pelvis consistent with metastatic disease. IMPRESSION: 1. Continued severe small bowel dilatation/small bowel obstruction without interval improvement since the prior study. No free air. No pneumatosis. Slight decreased mucosal enhancement at the right lower quadrant ostomy, but with residual small bowel loops in the right abdomen near the ostomy demonstrating prominent mucosal enhancement, suspicious for active disease. 2. Grossly stable infiltrative prostate mass with local invasion of adjacent structures. Numerous pelvic skeletal metastatic lesions re- demonstrated. 3. Large left abdominal hernia containing mesentery and bowel ; a slightly enlarged fluid-filled loops of small bowel in the hernia sac appears stable. 4. Re- demonstrated enlarged right lower quadrant lymph nodes. Electronically Signed   By: Donavan Foil M.D.   On: 11/21/2017 22:56   Dg Abd Portable 1v  Result Date: 11/23/2017 CLINICAL DATA:  Small bowel obstruction. EXAM: PORTABLE  ABDOMEN - 1 VIEW COMPARISON:  One-view abdomen 11/22/2017. CT of the abdomen pelvis 11/21/2017. FINDINGS: NG tube is in place. The bowel is now decompressed. Mild dilation remains in the right upper quadrant without obstruction. There is no free air. Right lower quadrant hernia repair is noted. IMPRESSION: 1. Significant decompression of bowel with NG tube in place. 2. Minimal residual dilated small bowel in the right upper quadrant. Electronically Signed   By: San Morelle M.D.   On: 11/23/2017 07:49   Dg Addison Bailey G Tube Plc W/fl W/rad  Result Date: 11/22/2017 CLINICAL DATA:  NG tube placement EXAM: NASO G TUBE PLACEMENT WITH FL AND WITH RAD CONTRAST:  None. FLUOROSCOPY TIME:  Fluoroscopy Time:  1 minutes and 0 seconds. Radiation Exposure Index (if provided by the fluoroscopic device): 5.7 mGy Number of Acquired Spot Images: . COMPARISON:  CT scan from 11/21/2017 was reviewed. FINDINGS: Viscous lidocaine was placed in the patient's left nostril. 80 French NG tube was lubricated with water-soluble lubricant and passed readily down the esophagus into the stomach. NG tube was placed to suction and a small amount of brownish fluid was aspirated. Patient tolerated procedure well. NG tube was fixed in place with pink tape. IMPRESSION: 29 French NG tube placed with the tip in the mid stomach, confirmed by fluoroscopic spot image. Electronically Signed   By: Misty Stanley M.D.   On: 11/22/2017 13:11    Anti-infectives: Anti-infectives (From admission, onward)   None      Assessment/Plan: Patient Active Problem List   Diagnosis Date Noted  . Nausea with vomiting 11/10/2017  . Spigelian hernia 09/22/2017  . Abdominal pain of multiple sites 09/22/2017  . Goals of care, counseling/discussion 04/02/2017  . Prostate cancer ([place]) 04/02/2017  . Elevated PSA 03/04/2017  . Osteopenia 11/03/2016  . Pelvic mass in male 09/04/2016  . 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 Exacerbation of Crohn's disease ([place])  09/04/2016  . Trigger finger, acquired 10/31/2015  . Protein-calorie malnutrition, severe ([place]) 07/27/2014  . SBO (small bowel obstruction) ([place]) 07/26/2014  . Loss of weight 06/09/2014  . Hypotension, unspecified 06/09/2014  . Crohn's disease of both small and large intestine with complication ([place]) 83/25/4982  . Boils 02/11/2013  . Ventral hernia 12/11/2009  . Anemia 12/03/2009  . Regional enteritis/Crohn's 01/25/2007  . LOW BACK PAIN 01/25/2007     Better with NGT  Follow for now  Follow GI recs   LOS: 1 day    Daniel Howard 11/23/2017

## 2017-11-23 NOTE — Progress Notes (Signed)
[facility] Progress Note    Since last GI note: Started feeling much better after NG tube placement yesterday (Epic charted about 450 ng output).   Abd much less swollen.  KUB this morning confirms signficant decompression with ng tube.  Objective: Vital signs in last 24 hours: Temp:  [97.8 F (36.6 C)-98.6 F (37 C)] 97.8 F (36.6 C) (12/03 0434) Pulse Rate:  [51-63] 51 (12/03 0434) Resp:  [16] 16 (12/03 0434) BP: (101-111)/(65-69) 101/65 (12/03 0434) SpO2:  [98 %-100 %] 99 % (12/03 0434) Last BM Date: 11/22/17 General: alert and oriented times 3 Heart: regular rate and rythm Abdomen: soft, non-tender, BS negative, liquid stool and small gas in right ileostomy bag  Lab Results: Recent Labs    11/21/17 1944 11/22/17 0458 11/23/17 0356  WBC 9.3 8.5 7.9  HGB 11.5* 9.7* 10.4*  PLT 404* 368 365  MCV 91.0 91.1 90.6   Recent Labs    11/21/17 1944 11/22/17 0458 11/23/17 0356  NA 134* 134* 137  K 4.1 4.1 4.8  CL 96* 100* 104  CO2 29 27 29   GLUCOSE 122* 117* 127*  BUN 12 13 11   CREATININE 1.27* 1.20 1.30*  CALCIUM 9.0 8.6* 8.6*   Recent Labs    11/20/17 0931 11/21/17 1944  PROT 7.5 7.5  ALBUMIN 3.1* 3.2*  AST 38* 34  ALT 45 47  ALKPHOS 62 72  BILITOT 0.66 0.8   Medications: Scheduled Meds: . calcium carbonate  1,250 mg Oral Q breakfast  . ferrous sulfate  325 mg Oral Daily  . methylPREDNISolone (SOLU-MEDROL) injection  40 mg Intravenous Q12H  . mirtazapine  15 mg Oral QHS  . multivitamin with minerals  1 tablet Oral Daily  . pantoprazole (PROTONIX) IV  40 mg Intravenous Q24H  . senna  1 tablet Oral BID  . sodium chloride flush  3 mL Intravenous Q12H   Continuous Infusions: . sodium chloride    . dextrose 5 % and 0.45% NaCl 100 mL/hr at 11/23/17 0454   PRN Meds:.sodium chloride, acetaminophen **OR** acetaminophen, albuterol, lidocaine-prilocaine, morphine injection, ondansetron **OR** ondansetron (ZOFRAN) IV, polyethylene glycol, sodium  chloride flush, traZODone    Assessment/Plan: 61 y.o. male with long standing crohn's disease, now with SBO  Bowel has decompressed nicely with NG tube and he feels much better.  I didn't hear bowel sounds but there is liquid stool and gas in his ileostomy this morming.  He was started on IV steroids (solumedrol 40 tid), I have decreased that to more usual IBD flare dosing (solumedrol 40 bid) this morning.  There is 'prominent mucosal enhancement' of the small bowel near the ileostomy and so there is probably a component of active crohn's disease; steroids will hopefully help while in hospital and then he will need to be considered for humira antibody testing/drug levels as outpatient (could dose escalate vs change to different biologic pending those results).  Given large abd wall hernia and almost certain significant adhesive disease, there may be a mechanical component to his SBO as well.    For now, stay on IV steroids (IV solumedrol 40bid), NPO except sips with meds, NG tube in place to LIS, IV hydration.  Thanks  Milus Banister, MD  11/23/2017, 7:58 AM [facility] Pager [phone]

## 2017-11-24 ENCOUNTER — Encounter (HOSPITAL_COMMUNITY): Payer: Self-pay | Admitting: General Practice

## 2017-11-24 ENCOUNTER — Ambulatory Visit: Payer: Medicare Other | Admitting: Oncology

## 2017-11-24 LAB — BASIC METABOLIC PANEL
ANION GAP: 8 (ref 5–15)
BUN: 9 mg/dL (ref 6–20)
CALCIUM: 8.3 mg/dL — AB (ref 8.9–10.3)
CO2: 28 mmol/L (ref 22–32)
CREATININE: 1.2 mg/dL (ref 0.61–1.24)
Chloride: 100 mmol/L — ABNORMAL LOW (ref 101–111)
GFR calc Af Amer: >60 mL/min (ref >60)
GLUCOSE: 125 mg/dL — AB (ref 65–99)
Potassium: 4.3 mmol/L (ref 3.5–5.1)
Sodium: 136 mmol/L (ref 135–145)

## 2017-11-24 MED ORDER — BOOST / RESOURCE BREEZE PO LIQD
1.0000 | Freq: Three times a day (TID) | ORAL | Status: DC
  Administered 2017-11-24 – 2017-11-26 (×5): 1 via ORAL

## 2017-11-24 NOTE — Progress Notes (Signed)
[facility] Progress Note    Since last GI note: The NG tube is irritating his throat. He ambulated in halls yesterday.  He is hungry  Objective: Vital signs in last 24 hours: Temp:  [97.1 F (36.2 C)-98.8 F (37.1 C)] 97.6 F (36.4 C) (12/04 0427) Pulse Rate:  [51-58] 51 (12/04 0427) Resp:  [16-17] 17 (12/04 0427) BP: (103-109)/(61-67) 103/65 (12/04 0427) SpO2:  [98 %-99 %] 99 % (12/04 0427) Last BM Date: 11/23/17 General: alert and oriented times 3 Heart: regular rate and rythm Abdomen: soft, non-tender, non-distended, + bowel sounds NG output 24 hours: 300cc Ostomy output 24 hours: 425cc (gas, liquid stool in ostomy bag)  Lab Results: Recent Labs    11/21/17 1944 11/22/17 0458 11/23/17 0356  WBC 9.3 8.5 7.9  HGB 11.5* 9.7* 10.4*  PLT 404* 368 365  MCV 91.0 91.1 90.6   Recent Labs    11/22/17 0458 11/23/17 0356 11/24/17 0414  NA 134* 137 136  K 4.1 4.8 4.3  CL 100* 104 100*  CO2 27 29 28   GLUCOSE 117* 127* 125*  BUN 13 11 9   CREATININE 1.20 1.30* 1.20  CALCIUM 8.6* 8.6* 8.3*   Recent Labs    11/21/17 1944  PROT 7.5  ALBUMIN 3.2*  AST 34  ALT 47  ALKPHOS 72  BILITOT 0.8    Medications: Scheduled Meds: . calcium carbonate  1,250 mg Oral Q breakfast  . ferrous sulfate  325 mg Oral Daily  . methylPREDNISolone (SOLU-MEDROL) injection  40 mg Intravenous Q12H  . mirtazapine  15 mg Oral QHS  . multivitamin with minerals  1 tablet Oral Daily  . pantoprazole (PROTONIX) IV  40 mg Intravenous Q24H  . senna  1 tablet Oral BID  . sodium chloride flush  3 mL Intravenous Q12H   Continuous Infusions: . sodium chloride    . dextrose 5 % and 0.45% NaCl 125 mL/hr at 11/24/17 0237   PRN Meds:.sodium chloride, acetaminophen **OR** acetaminophen, albuterol, lidocaine-prilocaine, morphine injection, ondansetron **OR** ondansetron (ZOFRAN) IV, polyethylene glycol, sodium chloride flush, traZODone    Assessment/Plan: 61 y.o. male with long standing  crohn's disease, current SBP  IV steroids for 2 days now (solumedrol 40 q12).  The SBO is much improved (after NG tube placement).  OK to clamp tube and if no recurrent of distension or vomiting within 8 hours then can d/c the tube and try him on clear liquids.  Continue IV steroids and IV fluids.  I will order clamp trial.   Daniel Banister, MD  11/24/2017, 7:29 AM [facility] Pager [phone]

## 2017-11-24 NOTE — Progress Notes (Signed)
Initial Nutrition Assessment  DOCUMENTATION CODES:   Severe malnutrition in context of chronic illness  INTERVENTION:   -RD will follow for diet advancement and supplement as appropriate  NUTRITION DIAGNOSIS:   Severe Malnutrition related to chronic illness(crohn's disease) as evidenced by severe fat depletion, severe muscle depletion, percent weight loss.  GOAL:   Patient will meet greater than or equal to 90% of their needs  MONITOR:   PO intake, Diet advancement, Labs, Weight trends, Skin, I & O's  REASON FOR ASSESSMENT:   Malnutrition Screening Tool    ASSESSMENT:   Daniel Howard is a 61 yo male with a pmhx significant for Crohn's disease (s/p small bowel resection and chronic ileostomy), prostate cancer, and bilateral spigelian hernia. He presented to [facility] yesterday due to increased abdominal pain and distension.   Pt admitted with SBO.   12/2- NGT placed for decompression  Pt very withdrawn at time of visit; he expresses frustration with the fact that he still has NGT in and is not allowed to eat. He is very eager to try clear liquids. He reports concern over poor oral intake over the past several weeks, however, did not elaborate more on oral intake. RD did not probe pt, due to concern to potentially upset pt.   Pt endorses wt loss, however, unsure of time frame of amount of weight loss. Noted pt has experienced a 13.2% wt loss over the past 3 months, which is significant for time frame.   Per general surgery notes, plan to clamp NGT and try clear liquids. RD discussed rationale for NPO order and plans for potential diet progression.   Medications reviewed and include solu-medrol, remeron, and MVI.  Labs reviewed.   NUTRITION - FOCUSED PHYSICAL EXAM:    Most Recent Value  Orbital Region  Severe depletion  Upper Arm Region  Severe depletion  Thoracic and Lumbar Region  Moderate depletion  Buccal Region  Moderate depletion  Temple Region  Severe depletion   Clavicle Bone Region  Severe depletion  Clavicle and Acromion Bone Region  Severe depletion  Scapular Bone Region  Severe depletion  Dorsal Hand  Severe depletion  Patellar Region  Severe depletion  Anterior Thigh Region  Severe depletion  Posterior Calf Region  Severe depletion  Edema (RD Assessment)  None  Hair  Reviewed  Eyes  Reviewed  Mouth  Reviewed  Skin  Reviewed  Nails  Reviewed       Diet Order:  Diet NPO time specified Except for: Sips with Meds  EDUCATION NEEDS:   Not appropriate for education at this time  Skin:  Skin Assessment: Reviewed RN Assessment  Last BM:  11/24/17 (150 ml output via ileostomy)  Height:   Ht Readings from Last 1 Encounters:  11/22/17 5' 4.5" (1.638 m)    Weight:   Wt Readings from Last 1 Encounters:  11/22/17 125 lb (56.7 kg)    Ideal Body Weight:  60.5 kg  BMI:  Body mass index is 21.12 kg/m.  Estimated Nutritional Needs:   Kcal:  1800-2000  Protein:  100-115 grams  Fluid:  > 1.8 L    Jabreel Chimento A. Jimmye Norman, RD, LDN, CDE Pager: [phone] After hours Pager: [phone]

## 2017-11-24 NOTE — Progress Notes (Signed)
[facility] Progress Note      Assessment/Plan Principal Problem:   SBO (small bowel obstruction) (HCC) Active Problems:   Anemia   Ventral hernia   Regional enteritis/Crohn's   Prostate cancer ([place])   Spigelian hernia   Abdominal pain of multiple sites  SBO - NGT output 300 in last 24 hours, clamped - pt is having ostomy output, 425 in last 24 hours  FEN: NPO, NGT clamped VTE: per medicine ID: none currently  Foley: none Follow up: TBD  DISPO: agree with GI to clamp and if no distention, nausea or vomiting then pull tube and try clears    LOS: 2 days    Subjective:  CC: sore throat  Pt denies abdominal pain, bloating, nausea or vomiting. He is hungry.   Objective: Vital signs in last 24 hours: Temp:  [97.1 F (36.2 C)-98.8 F (37.1 C)] 97.6 F (36.4 C) (12/04 0427) Pulse Rate:  [51-58] 51 (12/04 0427) Resp:  [16-17] 17 (12/04 0427) BP: (103-109)/(61-67) 103/65 (12/04 0427) SpO2:  [98 %-99 %] 99 % (12/04 0427) Last BM Date: 11/23/17  Intake/Output from previous day: 12/03 0701 - 12/04 0700 In: 1436.3 [P.O.:30; I.V.:1406.3] Out: 2440 [Urine:1715; Emesis/NG output:300; Stool:425] Intake/Output this shift: Total I/O In: -  Out: 200 [Urine:150; Emesis/NG output:50]  PE: Gen:  Alert, NAD, pleasant, cooperative Card:  RRR, no M/G/R heard Pulm:  Rate and effort normal Abd: Soft, NT/ND, +BS, multiple healed scars from previous surgeries, green liquid stool and gas in ostomy Skin: no rashes noted, warm and dry   Anti-infectives: Anti-infectives (From admission, onward)   None      Lab Results:  Recent Labs    11/22/17 0458 11/23/17 0356  WBC 8.5 7.9  HGB 9.7* 10.4*  HCT 29.8* 31.8*  PLT 368 365   BMET Recent Labs    11/23/17 0356 11/24/17 0414  NA 137 136  K 4.8 4.3  CL 104 100*  CO2 29 28  GLUCOSE 127* 125*  BUN 11 9  CREATININE 1.30* 1.20  CALCIUM 8.6* 8.3*   PT/INR No results for input(s): LABPROT, INR in  the last 72 hours. CMP     Component Value Date/Time   NA 136 11/24/2017 0414   NA 137 11/20/2017 0931   K 4.3 11/24/2017 0414   K 3.3 (L) 11/20/2017 0931   CL 100 (L) 11/24/2017 0414   CO2 28 11/24/2017 0414   CO2 27 11/20/2017 0931   GLUCOSE 125 (H) 11/24/2017 0414   GLUCOSE 99 11/20/2017 0931   BUN 9 11/24/2017 0414   BUN 16.6 11/20/2017 0931   CREATININE 1.20 11/24/2017 0414   CREATININE 1.2 11/20/2017 0931   CALCIUM 8.3 (L) 11/24/2017 0414   CALCIUM 8.9 11/20/2017 0931   PROT 7.5 11/21/2017 1944   PROT 7.5 11/20/2017 0931   ALBUMIN 3.2 (L) 11/21/2017 1944   ALBUMIN 3.1 (L) 11/20/2017 0931   AST 34 11/21/2017 1944   AST 38 (H) 11/20/2017 0931   ALT 47 11/21/2017 1944   ALT 45 11/20/2017 0931   ALKPHOS 72 11/21/2017 1944   ALKPHOS 62 11/20/2017 0931   BILITOT 0.8 11/21/2017 1944   BILITOT 0.66 11/20/2017 0931   GFRNONAA >60 11/24/2017 0414   GFRNONAA 52 (L) 02/11/2017 1640   GFRAA >60 11/24/2017 0414   GFRAA 60 02/11/2017 1640   Lipase     Component Value Date/Time   LIPASE 27 11/21/2017 1944    Studies/Results: Dg Abd Portable 1v  Result Date: 11/23/2017 CLINICAL DATA:  Small bowel obstruction. EXAM: PORTABLE ABDOMEN - 1 VIEW COMPARISON:  One-view abdomen 11/22/2017. CT of the abdomen pelvis 11/21/2017. FINDINGS: NG tube is in place. The bowel is now decompressed. Mild dilation remains in the right upper quadrant without obstruction. There is no free air. Right lower quadrant hernia repair is noted. IMPRESSION: 1. Significant decompression of bowel with NG tube in place. 2. Minimal residual dilated small bowel in the right upper quadrant. Electronically Signed   By: San Morelle M.D.   On: 11/23/2017 07:49   Dg Addison Bailey G Tube Plc W/fl W/rad  Result Date: 11/22/2017 CLINICAL DATA:  NG tube placement EXAM: NASO G TUBE PLACEMENT WITH FL AND WITH RAD CONTRAST:  None. FLUOROSCOPY TIME:  Fluoroscopy Time:  1 minutes and 0 seconds. Radiation Exposure Index (if  provided by the fluoroscopic device): 5.7 mGy Number of Acquired Spot Images: . COMPARISON:  CT scan from 11/21/2017 was reviewed. FINDINGS: Viscous lidocaine was placed in the patient's left nostril. 32 French NG tube was lubricated with water-soluble lubricant and passed readily down the esophagus into the stomach. NG tube was placed to suction and a small amount of brownish fluid was aspirated. Patient tolerated procedure well. NG tube was fixed in place with pink tape. IMPRESSION: 8 French NG tube placed with the tip in the mid stomach, confirmed by fluoroscopic spot image. Electronically Signed   By: Misty Stanley M.D.   On: 11/22/2017 13:11      Kalman Drape , [facility] 11/24/2017, 10:05 AM Pager: [phone] Consults: [phone] Mon-Fri 7:00 am-4:30 pm Sat-Sun 7:00 am-11:30 am

## 2017-11-24 NOTE — Progress Notes (Signed)
PROGRESS NOTE    Daniel Howard  GLO:[id number] DOB: 06-25-1956 DOA: 11/21/2017 PCP: Alycia Rossetti, MD   Specialists:     Brief Narrative:  61 year old male Known h/o ileocolonic crohn's-ileocecostomy + ileostomy-managed at [facility], Dr. Sydell Axon in [place]             -At baseline has 3-5 bowel movements that are usually formed  Prior rectal stricture 3 cm--not a good candidate for stricture repair H/o multiple hernia Previously on Humira   metastatic prostate cancer diagnosed 02/2017 with right subclavian Port-A-Cath on chemo--Taxotere status post 6 cycles 07/2017 Ongoing androgen deprivation therapy at [facility] urology Dr. Diona Fanti  Recent admission 11/24-11/26 partial small bowel obstruction Previous SBO 09/09/16   Assessment & Plan:   Principal Problem:   SBO (small bowel obstruction) ([place]) Active Problems:   Anemia   Ventral hernia   Regional enteritis/Crohn's   Prostate cancer ([place])   Spigelian hernia   Abdominal pain of multiple sites   Small bowel obstruction, long-standing Crohn's  Known chronic stricture that is Irrepairable--high-grade stricture near ileum Chronic ostomy NG tube has been clamped await effect of the same and if no distention or vomiting agree with GI can try oral--clear liquids  Will need outpatient discussion of alternate Biologics-has been on Humira since 2017 but failed recent budesonide trial--continuing Solu-Medrol 40 every 12 and defer taper of steroids as an outpatient to gastroenterology  Pain control attempt to change morphine to Percocet today if tolerating  Will saline lock IV D5 0.45 125 cc/h if able to tolerate p.o.am 12/5?  Metastatic prostate cancer diagnosed 02/2017 status post Taxotere 8/18 on ongoing androgen deprivation therapy Outpatient management  Anemia of malignancy Continue ferrous sulfate 325 daily  Reflux Continue pantoprazole 40 IV every 12 changed to p.o.  Later if able to take po  Insomnia continue Remeron 15 at bedtime, trazodone 50 at bedtime     DVT prophylaxis: SCD Code Status: Full Family Communication: None Disposition Plan: Inpatient   Consultants:   GI  General surgery  Procedures:   None  Antimicrobials:   None   Subjective: Well walking halls States ostomy has been changed and has been putting out succus since last pm No fever chills n v cp Weak as not eating much  Objective: Vitals:   11/23/17 0434 11/23/17 1300 11/23/17 2134 11/24/17 0427  BP: 101/65 109/61 106/67 103/65  Pulse: (!) 51 (!) 52 (!) 58 (!) 51  Resp: 16 16 16 17   Temp: 97.8 F (36.6 C) (!) 97.1 F (36.2 C) 98.8 F (37.1 C) 97.6 F (36.4 C)  TempSrc: Oral Oral Oral   SpO2: 99% 98% 98% 99%  Weight:      Height:        Intake/Output Summary (Last 24 hours) at 11/24/2017 1028 Last data filed at 11/24/2017 0837 Gross per 24 hour  Intake 1436.25 ml  Output 2240 ml  Net -803.75 ml   Filed Weights   11/21/17 2200 11/22/17 0348  Weight: 58.5 kg (129 lb) 56.7 kg (125 lb)    Examination:  eomi ncat Frail no pallor no ict ctya b abd soft-ostomy in LLQ--dark green material No reboudn no guard No le edema  Data Reviewed: I have personally reviewed following labs and imaging studies  CBC: Recent Labs  Lab 11/20/17 0931 11/21/17 1944 11/22/17 0458 11/23/17 0356  WBC 10.1 9.3 8.5 7.9  NEUTROABS 5.6  --   --   --   HGB 10.6*  11.5* 9.7* 10.4*  HCT 32.5* 35.3* 29.8* 31.8*  MCV 91.0 91.0 91.1 90.6  PLT 377 404* 368 161   Basic Metabolic Panel: Recent Labs  Lab 11/20/17 0931 11/21/17 1944 11/22/17 0458 11/23/17 0356 11/24/17 0414  NA 137 134* 134* 137 136  K 3.3* 4.1 4.1 4.8 4.3  CL  --  96* 100* 104 100*  CO2 27 29 27 29 28   GLUCOSE 99 122* 117* 127* 125*  BUN 16.6 12 13 11 9   CREATININE 1.2 1.27* 1.20 1.30* 1.20  CALCIUM 8.9 9.0 8.6* 8.6* 8.3*   GFR: Estimated Creatinine Clearance: 53.2 mL/min (by C-G formula  based on SCr of 1.2 mg/dL). Liver Function Tests: Recent Labs  Lab 11/20/17 0931 11/21/17 1944  AST 38* 34  ALT 45 47  ALKPHOS 62 72  BILITOT 0.66 0.8  PROT 7.5 7.5  ALBUMIN 3.1* 3.2*   Recent Labs  Lab 11/21/17 1944  LIPASE 27   No results for input(s): AMMONIA in the last 168 hours. Coagulation Profile: No results for input(s): INR, PROTIME in the last 168 hours. Cardiac Enzymes: No results for input(s): CKTOTAL, CKMB, CKMBINDEX, TROPONINI in the last 168 hours. BNP (last 3 results) No results for input(s): PROBNP in the last 8760 hours. HbA1C: No results for input(s): HGBA1C in the last 72 hours. CBG: No results for input(s): GLUCAP in the last 168 hours. Lipid Profile: No results for input(s): CHOL, HDL, LDLCALC, TRIG, CHOLHDL, LDLDIRECT in the last 72 hours. Thyroid Function Tests: No results for input(s): TSH, T4TOTAL, FREET4, T3FREE, THYROIDAB in the last 72 hours. Anemia Panel: No results for input(s): VITAMINB12, FOLATE, FERRITIN, TIBC, IRON, RETICCTPCT in the last 72 hours. Urine analysis:    Component Value Date/Time   COLORURINE YELLOW 11/21/2017 2108   APPEARANCEUR HAZY (A) 11/21/2017 2108   LABSPEC 1.011 11/21/2017 2108   PHURINE 7.0 11/21/2017 2108   GLUCOSEU NEGATIVE 11/21/2017 2108   HGBUR NEGATIVE 11/21/2017 2108   BILIRUBINUR NEGATIVE 11/21/2017 2108   KETONESUR NEGATIVE 11/21/2017 2108   PROTEINUR NEGATIVE 11/21/2017 2108   UROBILINOGEN 0.2 10/10/2014 1037   NITRITE NEGATIVE 11/21/2017 2108   LEUKOCYTESUR NEGATIVE 11/21/2017 2108     Radiology Studies: Reviewed images personally in health database    Scheduled Meds: . calcium carbonate  1,250 mg Oral Q breakfast  . ferrous sulfate  325 mg Oral Daily  . methylPREDNISolone (SOLU-MEDROL) injection  40 mg Intravenous Q12H  . mirtazapine  15 mg Oral QHS  . multivitamin with minerals  1 tablet Oral Daily  . pantoprazole (PROTONIX) IV  40 mg Intravenous Q24H  . senna  1 tablet Oral BID  .  sodium chloride flush  3 mL Intravenous Q12H   Continuous Infusions: . sodium chloride    . dextrose 5 % and 0.45% NaCl 125 mL/hr at 11/24/17 0237     LOS: 2 days    Time spent: [place], MD [facility]   If 7PM-7AM, please contact night-coverage [email or web address] Password [email or web address] 11/24/2017, 10:28 AM

## 2017-11-25 DIAGNOSIS — E43 Unspecified severe protein-calorie malnutrition: Secondary | ICD-10-CM

## 2017-11-25 DIAGNOSIS — D649 Anemia, unspecified: Secondary | ICD-10-CM

## 2017-11-25 MED ORDER — PANTOPRAZOLE SODIUM 40 MG PO TBEC
40.0000 mg | DELAYED_RELEASE_TABLET | Freq: Every day | ORAL | Status: DC
  Administered 2017-11-26: 40 mg via ORAL
  Filled 2017-11-25: qty 1

## 2017-11-25 MED ORDER — BUDESONIDE 3 MG PO CPEP
9.0000 mg | ORAL_CAPSULE | Freq: Every day | ORAL | Status: DC
  Administered 2017-11-25 – 2017-11-26 (×2): 9 mg via ORAL
  Filled 2017-11-25 (×2): qty 3

## 2017-11-25 NOTE — Progress Notes (Signed)
PROGRESS NOTE    Daniel Howard  RXV:[id number] DOB: 1956-10-01 DOA: 11/21/2017 PCP: Alycia Rossetti, MD   Specialists:     Brief Narrative:  61 year old male Known h/o ileocolonic crohn's-ileocecostomy + ileostomy-managed at [facility], Dr. Sydell Axon in [place]             -At baseline has 3-5 bowel movements that are usually formed  Prior rectal stricture 3 cm--not a good candidate for stricture repair H/o multiple hernia Previously on Humira   metastatic prostate cancer diagnosed 02/2017 with right subclavian Port-A-Cath on chemo--Taxotere status post 6 cycles 07/2017 Ongoing androgen deprivation therapy at [facility] urology Dr. Diona Fanti  Recent admission 11/24-11/26 partial small bowel obstruction Previous SBO 09/09/16   Assessment & Plan:   Principal Problem:   SBO (small bowel obstruction) ([place]) Active Problems:   Anemia   Ventral hernia   Regional enteritis/Crohn's   Prostate cancer ([place])   Spigelian hernia   Abdominal pain of multiple sites   Small bowel obstruction, long-standing Crohn's  Known chronic stricture that is Irrepairable--high-grade stricture near ileum Chronic ostomy NG tube out-on liquids and then solids per surgery/GI  transition Solu-Medrol 40 every 12 -->budesonide 9 daily  Will salined lock IV D5 0.45 125 cc/h.am 12/5?  Metastatic prostate cancer diagnosed 02/2017 status post Taxotere 8/18 on ongoing androgen deprivation therapy Outpatient management  Anemia of malignancy Continue ferrous sulfate 325 daily  Reflux Continue pantoprazole 40 IV every 12 changed to p.o.  Changed 12/5 to 40 qd  Insomnia continue Remeron 15 at bedtime, trazodone 50 at bedtime  DVT prophylaxis: SCD Code Status: Full Family Communication: None Disposition Plan: Inpatient   Consultants:   GI  General surgery  Procedures:   None  Antimicrobials:   None   Subjective:  tol po pretty well No  fever no abd pain no chills  passig flatus Dark liquid in bag   Objective: Vitals:   11/24/17 0427 11/24/17 1400 11/24/17 2019 11/25/17 0529  BP: 103/65 107/69 108/64 103/66  Pulse: (!) 51 (!) 53 81 61  Resp: 17 16 18 18   Temp: 97.6 F (36.4 C) 98.2 F (36.8 C) 98.5 F (36.9 C) 98.3 F (36.8 C)  TempSrc:  Oral Oral Oral  SpO2: 99% 99% 98% 100%  Weight:      Height:        Intake/Output Summary (Last 24 hours) at 11/25/2017 1314 Last data filed at 11/25/2017 0845 Gross per 24 hour  Intake 886.25 ml  Output 50 ml  Net 836.25 ml   Filed Weights   11/21/17 2200 11/22/17 0348  Weight: 58.5 kg (129 lb) 56.7 kg (125 lb)    Examination:  eomi ncat Frail no pallor no ict cta b abd soft-ostomy in LLQ--dark green material--more volume No rebound Le not swollen or edematous  Data Reviewed: I have personally reviewed following labs and imaging studies  CBC: Recent Labs  Lab 11/20/17 0931 11/21/17 1944 11/22/17 0458 11/23/17 0356  WBC 10.1 9.3 8.5 7.9  NEUTROABS 5.6  --   --   --   HGB 10.6* 11.5* 9.7* 10.4*  HCT 32.5* 35.3* 29.8* 31.8*  MCV 91.0 91.0 91.1 90.6  PLT 377 404* 368 509   Basic Metabolic Panel: Recent Labs  Lab 11/20/17 0931 11/21/17 1944 11/22/17 0458 11/23/17 0356 11/24/17 0414  NA 137 134* 134* 137 136  K 3.3* 4.1 4.1 4.8 4.3  CL  --  96* 100* 104 100*  CO2 27 29  27 29 28   GLUCOSE 99 122* 117* 127* 125*  BUN 16.6 12 13 11 9   CREATININE 1.2 1.27* 1.20 1.30* 1.20  CALCIUM 8.9 9.0 8.6* 8.6* 8.3*   GFR: Estimated Creatinine Clearance: 53.2 mL/min (by C-G formula based on SCr of 1.2 mg/dL). Liver Function Tests: Recent Labs  Lab 11/20/17 0931 11/21/17 1944  AST 38* 34  ALT 45 47  ALKPHOS 62 72  BILITOT 0.66 0.8  PROT 7.5 7.5  ALBUMIN 3.1* 3.2*   Recent Labs  Lab 11/21/17 1944  LIPASE 27   No results for input(s): AMMONIA in the last 168 hours. Coagulation Profile: No results for input(s): INR, PROTIME in the last 168  hours. Cardiac Enzymes: No results for input(s): CKTOTAL, CKMB, CKMBINDEX, TROPONINI in the last 168 hours. BNP (last 3 results) No results for input(s): PROBNP in the last 8760 hours. HbA1C: No results for input(s): HGBA1C in the last 72 hours. CBG: No results for input(s): GLUCAP in the last 168 hours. Lipid Profile: No results for input(s): CHOL, HDL, LDLCALC, TRIG, CHOLHDL, LDLDIRECT in the last 72 hours. Thyroid Function Tests: No results for input(s): TSH, T4TOTAL, FREET4, T3FREE, THYROIDAB in the last 72 hours. Anemia Panel: No results for input(s): VITAMINB12, FOLATE, FERRITIN, TIBC, IRON, RETICCTPCT in the last 72 hours. Urine analysis:    Component Value Date/Time   COLORURINE YELLOW 11/21/2017 2108   APPEARANCEUR HAZY (A) 11/21/2017 2108   LABSPEC 1.011 11/21/2017 2108   PHURINE 7.0 11/21/2017 2108   GLUCOSEU NEGATIVE 11/21/2017 2108   HGBUR NEGATIVE 11/21/2017 2108   BILIRUBINUR NEGATIVE 11/21/2017 2108   KETONESUR NEGATIVE 11/21/2017 2108   PROTEINUR NEGATIVE 11/21/2017 2108   UROBILINOGEN 0.2 10/10/2014 1037   NITRITE NEGATIVE 11/21/2017 2108   LEUKOCYTESUR NEGATIVE 11/21/2017 2108     Radiology Studies: Reviewed images personally in health database    Scheduled Meds: . budesonide  9 mg Oral Daily  . calcium carbonate  1,250 mg Oral Q breakfast  . feeding supplement  1 Container Oral TID BM  . ferrous sulfate  325 mg Oral Daily  . mirtazapine  15 mg Oral QHS  . multivitamin with minerals  1 tablet Oral Daily  . pantoprazole  40 mg Oral Q0600  . senna  1 tablet Oral BID  . sodium chloride flush  3 mL Intravenous Q12H   Continuous Infusions: . sodium chloride       LOS: 3 days    Time spent: [place], MD [facility] ([phone]   If 7PM-7AM, please contact night-coverage [email or web address] Password [email or web address] 11/25/2017, 1:14 PM

## 2017-11-25 NOTE — Progress Notes (Signed)
   Subjective/Chief Complaint: Doing well Tolerating clear  No pain or nausea   Objective: Vital signs in last 24 hours: Temp:  [98.2 F (36.8 C)-98.5 F (36.9 C)] 98.3 F (36.8 C) (12/05 0529) Pulse Rate:  [53-81] 61 (12/05 0529) Resp:  [16-18] 18 (12/05 0529) BP: (103-108)/(64-69) 103/66 (12/05 0529) SpO2:  [98 %-100 %] 100 % (12/05 0529) Last BM Date: 11/25/17  Intake/Output from previous day: 12/04 0701 - 12/05 0700 In: 1070.8 [I.V.:1070.8] Out: 450 [Urine:400; Emesis/NG output:50] Intake/Output this shift: Total I/O In: 480 [P.O.:480] Out: 50 [Stool:50]  GI: ostomy functioning  soft ND NT  multiple scars   Lab Results:  Recent Labs    11/23/17 0356  WBC 7.9  HGB 10.4*  HCT 31.8*  PLT 365   BMET Recent Labs    11/23/17 0356 11/24/17 0414  NA 137 136  K 4.8 4.3  CL 104 100*  CO2 29 28  GLUCOSE 127* 125*  BUN 11 9  CREATININE 1.30* 1.20  CALCIUM 8.6* 8.3*   PT/INR No results for input(s): LABPROT, INR in the last 72 hours. ABG No results for input(s): PHART, HCO3 in the last 72 hours.  Invalid input(s): PCO2, PO2  Studies/Results: No results found.  Anti-infectives: Anti-infectives (From admission, onward)   None      Assessment/Plan: Crohn's disease and partial SBO  - resolved   Advance diet     LOS: 3 days    Marcello Moores A Nick Armel 11/25/2017

## 2017-11-25 NOTE — Progress Notes (Signed)
Patient's b/p trending a little low but is asymptomatic.  Notified MD Samtini of trend.  Will await any orders for treatment.  Will continue to monitor.

## 2017-11-25 NOTE — Progress Notes (Signed)
Daily Rounding Note  11/25/2017, 9:28 AM  LOS: 3 days   SUBJECTIVE:   Chief complaint: nausea, vomiting, abd distention/pain: all resolved.    NGT pulled yesterday PM, tolerated 2 clear trays that evining (was really hungry!) and again this AM.  Surgeon advanced to soft diet this AM, will get this at lunch.  Passing soft stool via ostomy.  No recurrent distention.    OBJECTIVE:         Vital signs in last 24 hours:    Temp:  [98.2 F (36.8 C)-98.5 F (36.9 C)] 98.3 F (36.8 C) (12/05 0529) Pulse Rate:  [53-81] 61 (12/05 0529) Resp:  [16-18] 18 (12/05 0529) BP: (103-108)/(64-69) 103/66 (12/05 0529) SpO2:  [98 %-100 %] 100 % (12/05 0529) Last BM Date: 11/25/17 Filed Weights   11/21/17 2200 11/22/17 0348  Weight: 58.5 kg (129 lb) 56.7 kg (125 lb)   General: pleasant, thin, not looking acutely ill.  Comfortable.  alert   Heart: RRR Chest: clear bil.  No labored breathing or cough Abdomen: soft, NT, not distended.  Ostomy pink, healthy.  Stool in ostomy bag soft, brown.    Extremities: no CCE Neuro/Psych:  Oriented x 3.  Fully alert.  No weakness or gross deficits.    Lab Results: Recent Labs    11/23/17 0356  WBC 7.9  HGB 10.4*  HCT 31.8*  PLT 365   BMET Recent Labs    11/23/17 0356 11/24/17 0414  NA 137 136  K 4.8 4.3  CL 104 100*  CO2 29 28  GLUCOSE 127* 125*  BUN 11 9  CREATININE 1.30* 1.20  CALCIUM 8.6* 8.3*    Studies/Results: No results found.   Scheduled Meds: . calcium carbonate  1,250 mg Oral Q breakfast  . feeding supplement  1 Container Oral TID BM  . ferrous sulfate  325 mg Oral Daily  . methylPREDNISolone (SOLU-MEDROL) injection  40 mg Intravenous Q12H  . mirtazapine  15 mg Oral QHS  . multivitamin with minerals  1 tablet Oral Daily  . pantoprazole (PROTONIX) IV  40 mg Intravenous Q24H  . senna  1 tablet Oral BID  . sodium chloride flush  3 mL Intravenous Q12H   Continuous  Infusions: . sodium chloride     PRN Meds:.sodium chloride, acetaminophen **OR** acetaminophen, albuterol, lidocaine-prilocaine, morphine injection, ondansetron **OR** ondansetron (ZOFRAN) IV, polyethylene glycol, sodium chloride flush, traZODone   ASSESMENT:   *  SBO in pt with long-standing Crohn's dz who has undergone several surgeries.  Resolved. ? Active Crohns in right abdomen near ostomy, large left abdominal hernia containing mesentery and bowel per CT 12/1.   On bi weekly Humira and entocort at home.  Currently on Solumedrol.    *  Severe malnutrition per RD.  *  Metastatic to bone and locally invasive prostate cancer.  Dx 02/2017.  Completed Taxotere chemo 07/2017, chronic androgen deprivation therapy.     *  Anemia.  On oral iron. Hgb stable.      PLAN   *  Stop IV Protonix, start oral Protonix given pt hx of ulcers in 2010    Start prednisone 68m once daily and stop Solumedrol.    *  GI will sign off.  Pt will return to GI Dr RGala Romneyin [facility] follow up.  Has appt for 12/29/17.        SAzucena Freed 11/25/2017, 9:28 AM Pager: [phone] ________________________________________________________________________  LVelora HecklerGI MD note:  I personally examined the patient, reviewed the data and agree with the assessment and plan described above.  Bowel obstruction clearly resolved. OK to stop solumedrol today and start him on prednisone 70m once daily WITHOUT taper until he gets back in to see Dr. RGala Romneyearly next month.    Please call or page with any further questions or concerns. He is OK for discharge later today or tomorrow if he tolerates diet advance.  Thanks   DOwens Loffler MD [facility] Pager [phone]

## 2017-11-26 ENCOUNTER — Other Ambulatory Visit: Payer: Self-pay | Admitting: Gastroenterology

## 2017-11-26 ENCOUNTER — Ambulatory Visit (HOSPITAL_COMMUNITY): Payer: Medicare Other

## 2017-11-26 DIAGNOSIS — C61 Malignant neoplasm of prostate: Secondary | ICD-10-CM

## 2017-11-26 DIAGNOSIS — K56609 Unspecified intestinal obstruction, unspecified as to partial versus complete obstruction: Secondary | ICD-10-CM

## 2017-11-26 MED ORDER — BUDESONIDE 3 MG PO CPEP: 9.0000 mg | ORAL_CAPSULE | Freq: Every day | ORAL | 0 refills | Status: DC

## 2017-11-26 NOTE — Care Management Important Message (Signed)
Important Message  Patient Details  Name: Daniel Howard MRN: [id number] Date of Birth: 1956/01/07   Medicare Important Message Given:  Yes    [name] 11/26/2017, 12:41 PM

## 2017-11-26 NOTE — Discharge Summary (Signed)
Physician Discharge Summary  Daniel Howard QBH:[id number] DOB: 06/08/1956 DOA: 11/21/2017  PCP: Alycia Rossetti, MD  Admit date: 11/21/2017 Discharge date: 11/26/2017  Admitted From: home Disposition:  Home  Recommendations for Outpatient Follow-up:  1. Follow up with PCP in 1-2 weeks 2. Please obtain BMP/CBC in one week 3. Please follow up on the following pending results:  Home Health:No Equipment/Devices:none  Discharge Condition:stable CODE STATUS:full Diet recommendation: Heart Healthy   Brief/Interim Summary: 61 y.o. male with a history of ileocolonic ostomy, Crohn's disease followed at [facility] with a known history of rectal stricture not a candidate for surgical repair, metastatic cancer diagnosed in 02/2017, on androgen deprivation therapy comes in for abdominal pain and decreased output from ostomy with nausea and and vomiting    Discharge Diagnoses:  Principal Problem:   SBO (small bowel obstruction) ([place]) Active Problems:   Anemia   Ventral hernia   Regional enteritis/Crohn's   Prostate cancer ([place])   Spigelian hernia   Abdominal pain of multiple sites  SBO (small bowel obstruction) ([place]) With a 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 Once his output increase and he was tolerating his diet [name] will transition to oral which she will continue as an outpatient.  Metastatic prostate cancer: Continue management as an outpatient.  Normocytic anemia: Continue ferrous sulfate.   Discharge Instructions  Discharge Instructions    Diet - low sodium heart healthy   Complete by:  As directed    Increase activity slowly   Complete by:  As directed      Allergies as of 11/26/2017      Reactions   Penicillins Hives   Has patient had a PCN reaction causing immediate rash, facial/tongue/throat swelling, SOB or lightheadedness with hypotension: Yes Has patient had a PCN reaction causing  severe rash involving mucus membranes or skin necrosis: No Has patient had a PCN reaction that required hospitalization: No Has patient had a PCN reaction occurring within the last 10 years: No If all of the above answers are "NO", then may proceed with Cephalosporin use.      Medication List    TAKE these medications   Adalimumab 40 MG/0.8ML Pnkt Commonly known as:  HUMIRA PEN Inject 40 mg into the skin every 14 (fourteen) days.   budesonide 3 MG 24 hr capsule Commonly known as:  ENTOCORT EC Take 3 capsules (9 mg total) by mouth daily.   calcium carbonate 600 MG Tabs tablet Commonly known as:  OS-CAL Take 1,200 mg by mouth daily with breakfast.   CENTRUM SILVER ADULT 50+ Tabs Take 1 tablet by mouth daily.   Ferrous Sulfate 28 MG Tabs Take 1 tablet by mouth daily.   GAS-X PO Take by mouth as needed.   HYDROcodone-acetaminophen 5-325 MG tablet Commonly known as:  NORCO/VICODIN Take 1 tablet by mouth every 6 (six) hours as needed for moderate pain.   lidocaine-prilocaine cream Commonly known as:  EMLA Apply 1 application topically as needed (when accessing port).   mirtazapine 15 MG tablet Commonly known as:  REMERON TAKE 1 TABLET BY MOUTH AT BEDTIME What changed:    how much to take  how to take this  when to take this   ondansetron 4 MG tablet Commonly known as:  ZOFRAN Take 1 tablet (4 mg total) by mouth every 8 (eight) hours as needed for nausea or vomiting.   predniSONE 5 MG tablet Commonly known as:  DELTASONE Label  &  dispense according to the schedule below. take 8 Pills PO for 3 days, 6 Pills PO for 3 days, 4 Pills PO for 3 days, 2 Pills PO for 3 days, 1 Pills PO for 3 days, 1/2 Pill  PO for 3 days then STOP. Total 95 pills.       Allergies  Allergen Reactions  . Penicillins Hives    Has patient had a PCN reaction causing immediate rash, facial/tongue/throat swelling, SOB or lightheadedness with hypotension: Yes Has patient had a PCN reaction  causing severe rash involving mucus membranes or skin necrosis: No Has patient had a PCN reaction that required hospitalization: No Has patient had a PCN reaction occurring within the last 10 years: No If all of the above answers are "NO", then may proceed with Cephalosporin use.     Consultations:  surgery   Procedures/Studies: Ct Abdomen Pelvis W Contrast  Result Date: 11/21/2017 CLINICAL DATA:  Abdominal bloating EXAM: CT ABDOMEN AND PELVIS WITH CONTRAST TECHNIQUE: Multidetector CT imaging of the abdomen and pelvis was performed using the standard protocol following bolus administration of intravenous contrast. CONTRAST:  100 mL Isovue-300 intravenous COMPARISON:  11/14/2017, 09/22/2017, 02/05/2017, 09/04/2016 FINDINGS: Lower chest: Lung bases demonstrate small foci of dependent atelectasis. No acute consolidation or pleural effusion. Normal heart size. Hepatobiliary: No focal hepatic abnormality. Surgical absence of the gallbladder. Stable enlarged extrahepatic common bowel duct. Pancreas: No inflammation.  Stable pancreatic ductal dilatation. Spleen: Normal in size without focal abnormality. Adrenals/Urinary Tract: Adrenal glands are within normal limits. Kidneys show no hydronephrosis. The bladder demonstrates punctate dependent stones or calcifications. Stomach/Bowel: The stomach is nonenlarged. Re- demonstrated multiple dilated fluid-filled loops of small bowel measuring up to 6.8 cm in diameter. Right lower quadrant ostomy. Nondilated small bowel loops near the ostomy in the right hemiabdomen with mucosal enhancement. Re- demonstrated dilated small bowel within a left splayed daily in hernia measuring up to 3 cm. Vascular/Lymphatic: Nonaneurysmal aorta. Re- demonstrated enlarged right lower quadrant lymph nodes measuring up to 14 mm. Retroaortic left renal vein. Reproductive: Re- demonstrated infiltrative prostate mass measuring 6.5 x 4.3 cm with local invasion of structures as before. Other:  Negative for free air or significant free fluid. Large left lower quadrant hernia containing mesentery and bowel as before. Musculoskeletal: Re- demonstrated multiple sclerotic osseous lesions in the pelvis consistent with metastatic disease. IMPRESSION: 1. Continued severe small bowel dilatation/small bowel obstruction without interval improvement since the prior study. No free air. No pneumatosis. Slight decreased mucosal enhancement at the right lower quadrant ostomy, but with residual small bowel loops in the right abdomen near the ostomy demonstrating prominent mucosal enhancement, suspicious for active disease. 2. Grossly stable infiltrative prostate mass with local invasion of adjacent structures. Numerous pelvic skeletal metastatic lesions re- demonstrated. 3. Large left abdominal hernia containing mesentery and bowel ; a slightly enlarged fluid-filled loops of small bowel in the hernia sac appears stable. 4. Re- demonstrated enlarged right lower quadrant lymph nodes. Electronically Signed   By: Donavan Foil M.D.   On: 11/21/2017 22:56   Ct Abdomen Pelvis W Contrast  Result Date: 11/14/2017 CLINICAL DATA:  61 year old male with history of increased gas and bloating with abdominal pain, nausea, vomiting and diarrhea. No output in colostomy bag. History of Crohn's disease. Additional history of metastatic prostate cancer. EXAM: CT ABDOMEN AND PELVIS WITH CONTRAST TECHNIQUE: Multidetector CT imaging of the abdomen and pelvis was performed using the standard protocol following bolus administration of intravenous contrast. CONTRAST:  158m ISOVUE-300 IOPAMIDOL (ISOVUE-300) INJECTION 61%,  64m ISOVUE-300 IOPAMIDOL (ISOVUE-300) INJECTION 61% COMPARISON:  CT of the abdomen and pelvis 09/22/2017. FINDINGS: Lower chest: Mild scarring or subsegmental atelectasis in the lower lungs bilaterally. Hepatobiliary: No suspicious-appearing cystic or solid hepatic lesions. No intra or extrahepatic biliary ductal  dilatation. Status post cholecystectomy. Pancreas: No pancreatic mass. Mild diffuse pancreatic ductal dilatation (5 mm in diameter). No pancreatic or peripancreatic fluid collections or inflammatory changes. Spleen: Unremarkable. Adrenals/Urinary Tract: Multiple subcentimeter low-attenuation lesions in both kidneys, too small to characterize, but statistically likely to represent tiny cysts. Bilateral adrenal glands are normal in appearance. No hydroureteronephrosis. Tiny calcifications lying dependently in the urinary bladder on the left side adjacent to the left ureterovesicular junction, likely represent tiny stones. Urinary bladder is otherwise unremarkable in appearance. Stomach/Bowel: Stomach is mildly distended, but otherwise unremarkable in appearance. Compared to the prior examination there has been interval development of severe dilatation of the small bowel which measures up to 6.8 cm in diameter in the right-sided the abdomen (axial image 32 of series 2). 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 There is some dilatation of a loop of small bowel within this large left-sided Spigelian hernia which measures up to 3.9 cm in diameter. More distal loops of small bowel in the central abdomen and right side of the abdomen are more dramatically dilated up to 6.8 cm in diameter. Shortly before the right lower quadrant ostomy there is some focal narrowing of the distal small bowel where there appears to be some mucosal hyperenhancement, best appreciated on axial images 43-45 of series 2. This appearance is suggestive for a focus of active Crohn's disease. Vascular/Lymphatic: No significant atherosclerotic disease, aneurysm or dissection noted in the abdominal or pelvic vasculature. Retroaortic left renal vein (normal anatomical variant) incidentally noted. Numerous borderline enlarged and  enlarged ileocolic lymph nodes are again noted, measuring up to 15 mm in short axis on today's examination (axial image 46 of series 2), likely chronic and reactive. Reproductive: Large mass involving the prostate gland extending into the right side of the pelvis involving the right levator ani musculature, right pelvic sidewall and right seminal vesicle overall appears similar to the prior study, estimated to measure approximately 6.7 x 5.1 cm on today's examination (axial image 73 of series 2). Other: No significant volume of ascites.  No pneumoperitoneum. Musculoskeletal: Multiple sclerotic lesions are again noted throughout the bony pelvis, compatible with widespread metastatic disease to the bones, with the largest lesions involving the intertrochanteric region of the right femur and the right ischium. IMPRESSION: 1. Findings on today's examination suggests at least partial small bowel obstruction, presumably related to a focus of active Crohn's disease in the distal small bowel shortly before the right lower quadrant ostomy, as discussed above. 2. 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 As the small bowel proximal to this is nondilated, this is unlikely to represent a source of bowel obstruction at this time. 3. Chronic reactive ileocolic lymphadenopathy similar to the prior examination. 4. Large right-sided prostate mass invading the right pelvic sidewall and involving the right seminal vesicle similar to prior examinations. 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 5. Mild pancreatic ductal dilatation (5 mm) is of uncertain etiology and significance, and only slightly more prominent than the prior study. No obstructing pancreatic mass identified. 6. Additional incidental findings, as  above. Electronically Signed   By: Vinnie Langton M.D.   On: 11/14/2017 16:50   Dg Abd Portable 1v  Result Date: 11/23/2017 CLINICAL  DATA:  Small bowel obstruction. EXAM: PORTABLE ABDOMEN - 1 VIEW COMPARISON:  One-view abdomen 11/22/2017. CT of the abdomen pelvis 11/21/2017. FINDINGS: NG tube is in place. The bowel is now decompressed. Mild dilation remains in the right upper quadrant without obstruction. There is no free air. Right lower quadrant hernia repair is noted. IMPRESSION: 1. Significant decompression of bowel with NG tube in place. 2. Minimal residual dilated small bowel in the right upper quadrant. Electronically Signed   By: San Morelle M.D.   On: 11/23/2017 07:49   Dg Addison Bailey G Tube Plc W/fl W/rad  Result Date: 11/22/2017 CLINICAL DATA:  NG tube placement EXAM: NASO G TUBE PLACEMENT WITH FL AND WITH RAD CONTRAST:  None. FLUOROSCOPY TIME:  Fluoroscopy Time:  1 minutes and 0 seconds. Radiation Exposure Index (if provided by the fluoroscopic device): 5.7 mGy Number of Acquired Spot Images: . COMPARISON:  CT scan from 11/21/2017 was reviewed. FINDINGS: Viscous lidocaine was placed in the patient's left nostril. 47 French NG tube was lubricated with water-soluble lubricant and passed readily down the esophagus into the stomach. NG tube was placed to suction and a small amount of brownish fluid was aspirated. Patient tolerated procedure well. NG tube was fixed in place with pink tape. IMPRESSION: 77 French NG tube placed with the tip in the mid stomach, confirmed by fluoroscopic spot image. Electronically Signed   By: Misty Stanley M.D.   On: 11/22/2017 13:11     Subjective: No new complaints.  Discharge Exam: Vitals:   11/25/17 2200 11/26/17 0430  BP: 104/67 104/64  Pulse: 69 79  Resp: 17 16  Temp: 99 F (37.2 C) 98.7 F (37.1 C)  SpO2: 99% 100%   Vitals:   11/25/17 1357 11/25/17 1442 11/25/17 2200 11/26/17 0430  BP: (!) 97/56 98/64 104/67 104/64  Pulse: 80 72 69 79  Resp: 18  17 16   Temp: 97.8 F (36.6 C)  99 F (37.2 C) 98.7 F (37.1 C)  TempSrc: Oral  Oral   SpO2: 96%  99% 100%  Weight:       Height:        General: Pt is alert, awake, not in acute distress Cardiovascular: RRR, S1/S2 +, no rubs, no gallops Respiratory: CTA bilaterally, no wheezing, no rhonchi Abdominal: Soft, NT, ND, bowel sounds + Extremities: no edema, no cyanosis    The results of significant diagnostics from this hospitalization (including imaging, microbiology, ancillary and laboratory) are listed below for reference.     Microbiology: No results found for this or any previous visit (from the past 240 hour(s)).   Labs: BNP (last 3 results) No results for input(s): BNP in the last 8760 hours. Basic Metabolic Panel: Recent Labs  Lab 11/20/17 0931 11/21/17 1944 11/22/17 0458 11/23/17 0356 11/24/17 0414  NA 137 134* 134* 137 136  K 3.3* 4.1 4.1 4.8 4.3  CL  --  96* 100* 104 100*  CO2 27 29 27 29 28   GLUCOSE 99 122* 117* 127* 125*  BUN 16.6 12 13 11 9   CREATININE 1.2 1.27* 1.20 1.30* 1.20  CALCIUM 8.9 9.0 8.6* 8.6* 8.3*   Liver Function Tests: Recent Labs  Lab 11/20/17 0931 11/21/17 1944  AST 38* 34  ALT 45 47  ALKPHOS 62 72  BILITOT 0.66 0.8  PROT 7.5 7.5  ALBUMIN 3.1* 3.2*  Recent Labs  Lab 11/21/17 1944  LIPASE 27   No results for input(s): AMMONIA in the last 168 hours. CBC: Recent Labs  Lab 11/20/17 0931 11/21/17 1944 11/22/17 0458 11/23/17 0356  WBC 10.1 9.3 8.5 7.9  NEUTROABS 5.6  --   --   --   HGB 10.6* 11.5* 9.7* 10.4*  HCT 32.5* 35.3* 29.8* 31.8*  MCV 91.0 91.0 91.1 90.6  PLT 377 404* 368 365   Cardiac Enzymes: No results for input(s): CKTOTAL, CKMB, CKMBINDEX, TROPONINI in the last 168 hours. BNP: Invalid input(s): POCBNP CBG: No results for input(s): GLUCAP in the last 168 hours. D-Dimer No results for input(s): DDIMER in the last 72 hours. Hgb A1c No results for input(s): HGBA1C in the last 72 hours. Lipid Profile No results for input(s): CHOL, HDL, LDLCALC, TRIG, CHOLHDL, LDLDIRECT in the last 72 hours. Thyroid function studies No results  for input(s): TSH, T4TOTAL, T3FREE, THYROIDAB in the last 72 hours.  Invalid input(s): FREET3 Anemia work up No results for input(s): VITAMINB12, FOLATE, FERRITIN, TIBC, IRON, RETICCTPCT in the last 72 hours. Urinalysis    Component Value Date/Time   COLORURINE YELLOW 11/21/2017 2108   APPEARANCEUR HAZY (A) 11/21/2017 2108   LABSPEC 1.011 11/21/2017 2108   PHURINE 7.0 11/21/2017 2108   GLUCOSEU NEGATIVE 11/21/2017 2108   HGBUR NEGATIVE 11/21/2017 2108   BILIRUBINUR NEGATIVE 11/21/2017 2108   KETONESUR NEGATIVE 11/21/2017 2108   PROTEINUR NEGATIVE 11/21/2017 2108   UROBILINOGEN 0.2 10/10/2014 1037   NITRITE NEGATIVE 11/21/2017 2108   LEUKOCYTESUR NEGATIVE 11/21/2017 2108   Sepsis Labs Invalid input(s): PROCALCITONIN,  WBC,  LACTICIDVEN Microbiology No results found for this or any previous visit (from the past 240 hour(s)).   Time coordinating discharge: Over 30 minutes  SIGNED:   Charlynne Cousins, MD  [facility] 11/26/2017, 10:19 AM Pager   If 7PM-7AM, please contact night-coverage [email or web address] Password [email or web address]

## 2017-11-26 NOTE — Final Consult Note (Signed)
Consultant Final Sign-Off Note    Assessment/Final recommendations  Daniel Howard is a 61 y.o. male followed by me for SBO   Wound care (if applicable):    Diet at discharge: soft diet   Activity at discharge: per primary team   Follow-up appointment:  None needed   Pending results:  Unresulted Labs (From admission, onward)   None       Medication recommendations:   Other recommendations:    Thank you for allowing [place] to participate in the care of your patient!  Please consult [place] again if you have further needs for your patient.  Kalman Drape 11/26/2017 8:55 AM    Subjective   CC: SBO  No pain, bloating. Having good ostomy output, eating breakfast. Tolerating diet.  Objective  Vital signs in last 24 hours: Temp:  [97.8 F (36.6 C)-99 F (37.2 C)] 98.7 F (37.1 C) (12/06 0430) Pulse Rate:  [69-80] 79 (12/06 0430) Resp:  [16-18] 16 (12/06 0430) BP: (97-104)/(56-67) 104/64 (12/06 0430) SpO2:  [96 %-100 %] 100 % (12/06 0430)  PE:  Gen:  Alert, NAD, pleasant, cooperative Card:  RRR, no M/G/R heard Pulm:  Rate and effort normal Abd: Soft, NT/ND, +BS, multiple healed scars from previous surgeries, brown liquid stool and gas in ostomy Skin: no rashes noted, warm and dry   Pertinent labs and Studies: No results for input(s): WBC, HGB, HCT in the last 72 hours. BMET Recent Labs    11/24/17 0414  NA 136  K 4.3  CL 100*  CO2 28  GLUCOSE 125*  BUN 9  CREATININE 1.20  CALCIUM 8.3*   No results for input(s): LABURIN in the last 72 hours. Results for orders placed or performed in visit on 09/11/17  WOUND CULTURE     Status: Abnormal   Collection Time: 09/11/17  2:33 PM  Result Value Ref Range Status   MICRO NUMBER: [id number]  Final   SPECIMEN QUALITY: ADEQUATE  Final   SOURCE: ABDOMEN  Final   STATUS: FINAL  Final   GRAM STAIN:   Final    No white blood cells seen No epithelial cells seen No organisms seen   ISOLATE 1: Staphylococcus aureus (A)   Final      Susceptibility   Staphylococcus aureus - AEROBIC CULT, GRAM STAIN POSITIVE 1    VANCOMYCIN <=0.5 Sensitive     CIPROFLOXACIN <=0.5 Sensitive     CLINDAMYCIN <=0.25 Sensitive     LEVOFLOXACIN 0.25 Sensitive     ERYTHROMYCIN <=0.25 Sensitive     GENTAMICIN <=0.5 Sensitive     OXACILLIN* 0.5 Sensitive      * Oxacillin-susceptible staphylococci aresusceptible to other penicillinase-stablepenicillins (e.g. Methicillin, Nafcillin), beta-lactam/beta-lactamase inhibitor combinations, andcephems with staphylococcal indications, includingCefazolin.    TETRACYCLINE <=1 Sensitive     TRIMETH/SULFA* <=10 Sensitive      * Oxacillin-susceptible staphylococci aresusceptible to other penicillinase-stablepenicillins (e.g. Methicillin, Nafcillin), beta-lactam/beta-lactamase inhibitor combinations, andcephems with staphylococcal indications, includingCefazolin.Legend:S = Susceptible  I = IntermediateR = Resistant  NS = Not susceptible* = Not tested  NR = Not reported**NN = See antimicrobic comments    Imaging: No results found.   Jackson Latino, [facility] Surgery Pager [phone]

## 2017-12-02 ENCOUNTER — Ambulatory Visit (INDEPENDENT_AMBULATORY_CARE_PROVIDER_SITE_OTHER): Payer: Medicare Other | Admitting: Family Medicine

## 2017-12-02 ENCOUNTER — Encounter: Payer: Self-pay | Admitting: Family Medicine

## 2017-12-02 ENCOUNTER — Other Ambulatory Visit: Payer: Self-pay

## 2017-12-02 VITALS — BP 118/76 | HR 82 | Temp 99.6°F | Resp 16 | Ht 64.5 in | Wt 131.0 lb

## 2017-12-02 DIAGNOSIS — L089 Local infection of the skin and subcutaneous tissue, unspecified: Secondary | ICD-10-CM

## 2017-12-02 DIAGNOSIS — K50819 Crohn's disease of both small and large intestine with unspecified complications: Secondary | ICD-10-CM

## 2017-12-02 DIAGNOSIS — R634 Abnormal weight loss: Secondary | ICD-10-CM | POA: Diagnosis not present

## 2017-12-02 DIAGNOSIS — R1084 Generalized abdominal pain: Secondary | ICD-10-CM | POA: Diagnosis not present

## 2017-12-02 DIAGNOSIS — E43 Unspecified severe protein-calorie malnutrition: Secondary | ICD-10-CM | POA: Diagnosis not present

## 2017-12-02 DIAGNOSIS — L723 Sebaceous cyst: Secondary | ICD-10-CM | POA: Diagnosis not present

## 2017-12-02 LAB — CBC WITH DIFFERENTIAL/PLATELET
BASOS PCT: 0.1 %
Basophils Absolute: 11 cells/uL (ref 0–200)
EOS PCT: 0.3 %
Eosinophils Absolute: 33 cells/uL (ref 15–500)
HCT: 30.5 % — ABNORMAL LOW (ref 38.5–50.0)
HEMOGLOBIN: 10.1 g/dL — AB (ref 13.2–17.1)
Lymphs Abs: 1554 cells/uL (ref 850–3900)
MCH: 30 pg (ref 27.0–33.0)
MCHC: 33.1 g/dL (ref 32.0–36.0)
MCV: 90.5 fL (ref 80.0–100.0)
MONOS PCT: 5 %
MPV: 9.3 fL (ref 7.5–12.5)
NEUTROS ABS: 8947 {cells}/uL — AB (ref 1500–7800)
Neutrophils Relative %: 80.6 %
Platelets: 286 10*3/uL (ref 140–400)
RBC: 3.37 10*6/uL — AB (ref 4.20–5.80)
RDW: 14.7 % (ref 11.0–15.0)
Total Lymphocyte: 14 %
WBC mixed population: 555 cells/uL (ref 200–950)
WBC: 11.1 10*3/uL — AB (ref 3.8–10.8)

## 2017-12-02 LAB — COMPREHENSIVE METABOLIC PANEL
AG RATIO: 1 (calc) (ref 1.0–2.5)
ALKALINE PHOSPHATASE (APISO): 62 U/L (ref 40–115)
ALT: 52 U/L — AB (ref 9–46)
AST: 25 U/L (ref 10–35)
Albumin: 3.2 g/dL — ABNORMAL LOW (ref 3.6–5.1)
BILIRUBIN TOTAL: 0.5 mg/dL (ref 0.2–1.2)
BUN: 15 mg/dL (ref 7–25)
CALCIUM: 9 mg/dL (ref 8.6–10.3)
CO2: 27 mmol/L (ref 20–32)
Chloride: 102 mmol/L (ref 98–110)
Creat: 0.99 mg/dL (ref 0.70–1.33)
GLOBULIN: 3.3 g/dL (ref 1.9–3.7)
GLUCOSE: 99 mg/dL (ref 65–99)
Potassium: 4.9 mmol/L (ref 3.5–5.3)
Sodium: 136 mmol/L (ref 135–146)
TOTAL PROTEIN: 6.5 g/dL (ref 6.1–8.1)

## 2017-12-02 MED ORDER — BUDESONIDE 3 MG PO CPEP: 9.0000 mg | ORAL_CAPSULE | Freq: Every day | ORAL | 0 refills | Status: DC

## 2017-12-02 MED ORDER — HYDROCODONE-ACETAMINOPHEN 5-325 MG PO TABS: 1.0000 | ORAL_TABLET | Freq: Four times a day (QID) | ORAL | 0 refills | Status: DC | PRN

## 2017-12-02 MED ORDER — SULFAMETHOXAZOLE-TRIMETHOPRIM 800-160 MG PO TABS: 1.0000 | ORAL_TABLET | Freq: Two times a day (BID) | ORAL | 0 refills | Status: DC

## 2017-12-02 NOTE — Addendum Note (Signed)
Addended by: Sheral Flow on: 12/02/2017 03:56 PM   Modules accepted: Orders

## 2017-12-02 NOTE — Assessment & Plan Note (Signed)
2 recent bowel obstruction secondary to his Crohn's disease.  He is currently on prednisone taper.  We will recheck his labs per hospital discharge he will see his gastroenterologist in a few weeks.  We will see if his insurance will cover the Ensure supplement needed for his protein malnutrition.  He is also on mirtazapine.   Initially I thought that he may have had abscess however this is an infected sebaceous cyst on his face.  There is significant cyst that needs to be removed entirely however I was able to express a good amount of pus off before the cystic material started to come out.  I would refer him to dermatology to have this completely excised.  He will start the Bactrim as when he is on prednisone and he is immunocompromised wound culture was also taken.

## 2017-12-02 NOTE — Patient Instructions (Addendum)
Take antibiotics as prescribed- BACTRIM Referral to Dermatology We will call with lab results F/U 4 months

## 2017-12-02 NOTE — Progress Notes (Signed)
Subjective:    Patient ID: Daniel Howard, male    DOB: 1956-08-29, 61 y.o.   MRN: [id number]  Patient presents for Abscess (x1 week- abscess to R cheek- states that area was smaller when he was in hospital with bowel obstruction, but has grown larger and ruptured)  Here with abscess to the right side of his face for the past week.  States that he has some mild tenderness minimal swelling when he was in the hospital recently for small bowel obstruction related to his Crohn's disease.  He was sent home with prednisone taper which he is still taking.  This is actual second admission in the past couple months for his Crohn's.  2 days ago he noted some bleeding and mild pus from the abscess.  Denies any fever or chills.  His appetite has improved.  Medications reviewed.  Weight down 11 pounds since his visit in September, he asked for prescription for Ensure Review Of Systems:  GEN- denies fatigue, fever, +weight loss,weakness, recent illness HEENT- denies eye drainage, change in vision, nasal discharge, CVS- denies chest pain, palpitations RESP- denies SOB, cough, wheeze ABD- denies N/V, change in stools, abd pain GU- denies dysuria, hematuria, dribbling, incontinence MSK- denies joint pain, muscle aches, injury Neuro- denies headache, dizziness, syncope, seizure activity       Objective:    BP 118/76   Pulse 82   Temp 99.6 F (37.6 C) (Oral)   Resp 16   Ht 5' 4.5" (1.638 m)   Wt 131 lb (59.4 kg)   SpO2 98%   BMI 22.14 kg/m  GEN- NAD, alert and oriented x3 HEENT- PERRL, EOMI, non injected sclera, pink conjunctiva, MMM, oropharynx clear Neck- Supple, no LAD Skin- Right face 1-2 inches below temple grape size abscess flutuant at center, TTP, no appreciable erythema CVS- RRR, no murmur RESP-CTAB ABD-NABS,soft,NT,ND, colostomy in tact  EXT- No edema Pulses- Radial  2+   Procedure- Incision and Drainage Procedure explained to patient questions answered benefits and risks  discussed written consent obtained. Antiseptic-Betadine Anesthesia-lidocaine 1%, No Epi  Small Incision performed moderate amount of pus and cystic material  Culture taken Minimal blood loss Patient tolerated procedure well though painful  Bandage applied      Assessment & Plan:      Problem List Items Addressed This Visit      Unprioritized   Protein-calorie malnutrition, severe ([place])   Loss of weight   Relevant Medications   budesonide (ENTOCORT EC) 3 MG 24 hr capsule   Crohn's disease of both small and large intestine with complication ([place])    2 recent bowel obstruction secondary to his Crohn's disease.  He is currently on prednisone taper.  We will recheck his labs per hospital discharge he will see his gastroenterologist in a few weeks.  We will see if his insurance will cover the Ensure supplement needed for his protein malnutrition.  He is also on mirtazapine.   Initially I thought that he may have had abscess however this is an infected sebaceous cyst on his face.  There is significant cyst that needs to be removed entirely however I was able to express a good amount of pus off before the cystic material started to come out.  I would refer him to dermatology to have this completely excised.  He will start the Bactrim as when he is on prednisone and he is immunocompromised wound culture was also taken.      Relevant Medications   budesonide (ENTOCORT  EC) 3 MG 24 hr capsule   Other Relevant Orders   CBC with Differential/Platelet   Comprehensive metabolic panel    Other Visit Diagnoses    Infected sebaceous cyst    -  Primary   Relevant Medications   sulfamethoxazole-trimethoprim (BACTRIM DS,SEPTRA DS) 800-160 MG tablet   Other Relevant Orders   Ambulatory referral to Dermatology   Generalized abdominal pain       Relevant Medications   budesonide (ENTOCORT EC) 3 MG 24 hr capsule      Note: This dictation was prepared with Dragon dictation along with smaller  phrase technology. Any transcriptional errors that result from this process are unintentional.

## 2017-12-03 DIAGNOSIS — T451X5A Adverse effect of antineoplastic and immunosuppressive drugs, initial encounter: Secondary | ICD-10-CM | POA: Diagnosis not present

## 2017-12-03 DIAGNOSIS — L0291 Cutaneous abscess, unspecified: Secondary | ICD-10-CM | POA: Diagnosis not present

## 2017-12-05 LAB — WOUND CULTURE
MICRO NUMBER: 81397611
SPECIMEN QUALITY:: ADEQUATE

## 2017-12-07 ENCOUNTER — Other Ambulatory Visit: Payer: Self-pay | Admitting: *Deleted

## 2017-12-07 ENCOUNTER — Telehealth: Payer: Self-pay

## 2017-12-07 MED ORDER — MUPIROCIN CALCIUM 2 % NA OINT: TOPICAL_OINTMENT | NASAL | 0 refills | Status: DC

## 2017-12-07 NOTE — Telephone Encounter (Signed)
Received a VM from [place] from [place] in reference to pts Humira injectable. Returned call, and clarity was given for Humira. Humria 40 mg injectable pen is to be injected q 14 days as directed per EG. Contact number [phone]

## 2017-12-09 DIAGNOSIS — L0201 Cutaneous abscess of face: Secondary | ICD-10-CM | POA: Diagnosis not present

## 2017-12-11 ENCOUNTER — Ambulatory Visit (INDEPENDENT_AMBULATORY_CARE_PROVIDER_SITE_OTHER): Payer: Medicare Other

## 2017-12-11 ENCOUNTER — Encounter (HOSPITAL_COMMUNITY): Payer: Self-pay | Admitting: Emergency Medicine

## 2017-12-11 ENCOUNTER — Ambulatory Visit (HOSPITAL_COMMUNITY)
Admission: EM | Admit: 2017-12-11 | Discharge: 2017-12-11 | Disposition: A | Discharge status: Home / Self Care | Payer: Medicare Other | Attending: Internal Medicine | Admitting: Internal Medicine

## 2017-12-11 DIAGNOSIS — R05 Cough: Secondary | ICD-10-CM | POA: Diagnosis not present

## 2017-12-11 DIAGNOSIS — J22 Unspecified acute lower respiratory infection: Secondary | ICD-10-CM

## 2017-12-11 MED ORDER — PREDNISONE 20 MG PO TABS: 40.0000 mg | ORAL_TABLET | Freq: Every day | ORAL | 0 refills | Status: AC

## 2017-12-11 MED ORDER — AZITHROMYCIN 250 MG PO TABS: ORAL_TABLET | ORAL | 0 refills | Status: DC

## 2017-12-11 NOTE — ED Provider Notes (Signed)
[facility]    CSN: [id number] Arrival date & time: 12/11/17  1005     History   Chief Complaint Chief Complaint  Patient presents with  . URI    HPI Daniel Howard is a 61 y.o. male.   Emad presents with complaints of cough and wheezing which started approximately 4 days ago. Has not worsened but has not improved. Denies chest pain or shortness of breath. Without fevers. Mild runny nose. Without ear pain or sore throat. Son with cough from allergies, no other known ill contacts. Took robitussin a few days ago which did not help. Does not smoke. Completed chemo for prostate cancer approximately 2 months ago per patient. History of crohns with colostomy, SBO 11/26. Completed a prednisone taper for this just two days ago. 12/12 had I&d of right cheek, cultured for MRSA and is taking bactrim for this. Without leg swelling or pain. Without history of htn, kidney disease or asthma. Creatinine 12/12 0.99.    ROS per HPI.       Past Medical History:  Diagnosis Date  . Abnormal finding of biliary tract    MRCP shows pancreatic/biliary tract dilation. EUS 2010 confirmed dilation but no chronic pancreaitis or mass. Vascular ectasia crimpoing distal CBD.   [name] Anxiety   . Cancer ([place])   . 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 Duodenal ulcer 2010   nsaids  . Low back pain   . Peristomal hernia   . Small bowel obstruction ([place]) 10/2017; 11/21/2017  . Spigelian hernia    bilateral    Patient Active Problem List   Diagnosis Date Noted  . Nausea with vomiting 11/10/2017  . Spigelian hernia 09/22/2017  . Abdominal pain of  multiple sites 09/22/2017  . Goals of care, counseling/discussion 04/02/2017  . Prostate cancer ([place]) 04/02/2017  . Elevated PSA 03/04/2017  . Osteopenia 11/03/2016  . Pelvic mass in male 09/04/2016  . 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 Exacerbation of Crohn's disease ([place]) 09/04/2016  . Trigger finger, acquired 10/31/2015  . Protein-calorie malnutrition, severe ([place]) 07/27/2014  . SBO (small bowel obstruction) ([place]) 07/26/2014  . Loss of weight 06/09/2014  . Hypotension, unspecified 06/09/2014  . Crohn's disease of both small and large intestine with complication ([place]) 80/02/4916  . Boils 02/11/2013  . Ventral hernia 12/11/2009  . Anemia 12/03/2009  . Regional enteritis/Crohn's 01/25/2007  . LOW BACK PAIN 01/25/2007    Past Surgical History:  Procedure Laterality Date  . APPENDECTOMY  1992   at time of exp laparotomy at which time he was noted to have fistulizing Crohn's rather than UC  . COLONOSCOPY [name] 08/23/2014   [id number] proctoscopy with possible fistulous opening in thebase of rectal/anal stump.    . COLOSTOMY  1992   transverse loop colostomy secondary to a stricture  . ESOPHAGOGASTRODUODENOSCOPY  05/2009   SLF: multiple antral erosions, large ulcer at ansatomosis (postsurgical changes at duodenal bulb and second portion of duodenum) BX c/x NSAIDS.  . EUS  10/04/2009   Dr. Estill Bakes dilated CBD and main pancreatic duct.  No pancreatic  . FLEXIBLE SIGMOIDOSCOPY  1988   Dr. Laural Golden- suggested rohn's disease but the biopsies were not collaborative.  [name] FLEXIBLE  SIGMOIDOSCOPY [name] 09/08/2016   Procedure: FLEXIBLE SIGMOIDOSCOPY;  Surgeon: Wonda Horner, MD;  Location: [facility] ENDOSCOPY;  Service: Gastroenterology;  Laterality: [name];  . HEMICOLOECTOMY W/ ANASTOMOSIS  1993   R- Dr.DeMason   . HERNIA REPAIR  1996   incarcerated periostial hernia with additional surgery in 1999  . IR FLUORO GUIDE PORT INSERTION RIGHT  04/03/2017  . IR US GUIDE VASC ACCESS RIGHT  04/03/2017        Home Medications    Prior to Admission medications   Medication Sig Start Date End Date Taking? Authorizing Provider  budesonide (ENTOCORT EC) 3 MG 24 hr capsule Take 3 capsules (9 mg total) by mouth daily. 12/02/17  Yes [place], [name], MD  calcium carbonate (OS-CAL) 600 MG TABS tablet Take 1,200 mg by mouth daily with breakfast.   Yes [provider]  Ferrous Sulfate 28 MG TABS Take 1 tablet by mouth daily.   Yes [provider]  Multiple Vitamins-Minerals (CENTRUM SILVER ADULT 50+) TABS Take 1 tablet by mouth daily.   Yes [provider]  mupirocin nasal ointment (BACTROBAN) 2 % Use one-half of tube in each nostril twice daily for five (5) days. 12/07/17  Yes [place], [name], MD  sulfamethoxazole-trimethoprim (BACTRIM DS,SEPTRA DS) 800-160 MG tablet Take 1 tablet by mouth 2 (two) times daily. 12/02/17  Yes Raoul, [name], MD  Adalimumab (HUMIRA PEN) 40 MG/0.8ML PNKT Inject 40 mg into the skin every 14 (fourteen) days. 12/19/16   Annitta Needs, NP  azithromycin (ZITHROMAX) 250 MG tablet Take first 2 tablets together, then 1 every day until finished. 12/11/17   Zigmund Gottron, NP  HUMIRA PEN 40 MG/0.8ML PNKT INJECT 40MG (1 PEN) SUBCUTANEOUSLY EVERY 14 DAYS 11/26/17   Carlis Stable, NP  HYDROcodone-acetaminophen (NORCO/VICODIN) 5-325 MG tablet Take 1 tablet by mouth every 6 (six) hours as needed for moderate pain. 12/02/17   Alycia Rossetti, MD  lidocaine-prilocaine (EMLA) cream Apply 1 application topically as needed (when accessing port).    [provider]  mirtazapine (REMERON) 15 MG tablet TAKE 1 TABLET BY MOUTH AT BEDTIME Patient taking differently: TAKE 15 mg TABLET BY MOUTH AT BEDTIME 10/14/17   [place], [name], MD  ondansetron (ZOFRAN) 4 MG tablet Take 1 tablet (4 mg total) by mouth every 8 (eight) hours as needed for nausea or vomiting. 11/10/17   Carlis Stable, NP  predniSONE (DELTASONE) 20 MG tablet Take 2 tablets (40 mg total)  by mouth daily with breakfast for 5 days. 12/11/17 12/16/17  Zigmund Gottron, NP  Simethicone (GAS-X PO) Take by mouth as needed.    [provider]    Family History Family History  Problem Relation Age of Onset  . Cancer Father        prostate   . Prostate cancer Father   . Colon cancer Father 72  . Hypertension Sister   . Cancer Sister   . Depression Sister   . Breast cancer Sister   . COPD Sister   . Aneurysm Brother        deceased, brain aneurysm    Social History Social History   Tobacco Use  . Smoking status: Former Smoker    Packs/day: 0.50    Years: 2.00    Pack years: 1.00    Types: Cigarettes    Last attempt to quit: 12/21/2009    Years since quitting: 7.9  . Smokeless tobacco: Never Used  . Tobacco comment: Quit abut 20 years  Substance Use Topics  . Alcohol use: No    Alcohol/week: 0.0 oz    Comment: Former drinker  . Drug use: No     Allergies   Penicillins   Review of Systems Review of Systems   Physical Exam Triage Vital Signs ED Triage Vitals [12/11/17 1026]  Enc Vitals Group     BP 120/76     Pulse Rate (!) 104     Resp 16     Temp 98.8 F (37.1 C)     Temp Source Oral     SpO2 97 %     Weight      Height      Head Circumference      Peak Flow      Pain Score      Pain Loc      Pain Edu?      Excl. in [place]?    No data found.  Updated Vital Signs BP 120/76 (BP Location: Right Arm)   Pulse (!) 104 Comment: Reported HR to [employer]  Temp 98.8 F (37.1 C) (Oral)   Resp 16   SpO2 97%   Visual Acuity Right Eye Distance:   Left Eye Distance:   Bilateral Distance:    Right Eye Near:   Left Eye Near:    Bilateral Near:     Physical Exam  Constitutional: He is oriented to person, place, and time. He appears well-developed and well-nourished.  Cardiovascular: Normal rate and regular rhythm.  Pulmonary/Chest: Effort normal. He has wheezes. He has rhonchi.  Scattered rhonchi throughout with faint expiratory  wheezes noted; without tachypnea or respiratory distress   Neurological: He is alert and oriented to person, place, and time.  Skin: Skin is warm and dry.     UC Treatments / Results  Labs (all labs ordered are listed, but only abnormal results are displayed) Labs Reviewed - No data to display  EKG  EKG Interpretation None       Radiology Dg Chest 2 View  Result Date: 12/11/2017 CLINICAL DATA:  Per pt: slight cough, wheezing started four days ago. No history of respiratory or cardiac disease. 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 Non-smoker. No HBP. Patient is not a diabetic EXAM: CHEST  2 VIEW COMPARISON:  05/23/2013 FINDINGS: Patient has right-sided PowerPort, tip overlying the level of superior vena cava. The heart size is normal. The lungs are free of focal consolidations and pleural effusions. No pulmonary edema. Minimal left lower lobe atelectasis or scarring. IMPRESSION: 1.  No evidence for acute cardiopulmonary abnormality. 2. Left lower lobe atelectasis or scarring. Electronically Signed   By: Nolon Nations M.D.   On: 12/11/2017 11:16    Procedures Procedures (including critical care time)  Medications Ordered in UC Medications - No data to display   Initial Impression / Assessment and Plan / UC Course  I have reviewed the triage vital signs and the nursing notes.  Pertinent labs & imaging results that were available during my care of the patient were reviewed by me and considered in my medical decision making (see chart for details).  Clinical Course as of Dec 11 1122  Fri Dec 11, 2017  1054 HR 98  [NB]    Clinical Course User Index [NB] Augusto Gamble B, NP    Patient non toxic in appearance, symptoms have not been progressing. Without fevers, fatigue, shortness of breath. Concern due to patient medical history, azithromycin course provided. Chest  xray is reassuring today. 5 more days of 40 mg of prednisone.  Recommended recheck with pcp in the next 3-5 days due to recent hospitalization and medical history, concern for potential worsening of condition. Return precautions provided. Patient verbalized understanding and agreeable to plan.    Final Clinical Impressions(s) / UC Diagnoses   Final diagnoses:  Lower respiratory tract infection    ED Discharge Orders        Ordered    azithromycin (ZITHROMAX) 250 MG tablet     12/11/17 1121    predniSONE (DELTASONE) 20 MG tablet  Daily with breakfast     12/11/17 1121       Controlled Substance Prescriptions [place] Controlled Substance Registry consulted? Not Applicable   Zigmund Gottron, NP 12/11/17 1124    Augusto Gamble B, NP 12/11/17 1125

## 2017-12-11 NOTE — Discharge Instructions (Signed)
Complete course of provided antibiotics.  Push fluids to ensure adequate hydration and keep secretions thin.  5 more days of prednisone. Please schedule an appointment with your primary care provider for a recheck in the next 3-5 days. If develop fevers, shortness of breath, chest pain, or otherwise worsening of symptoms please return to be seen or go to Er.

## 2017-12-11 NOTE — ED Triage Notes (Signed)
PT C/O: cold sx associated w/wheezing, chest congestion  ONSET: 4  Days   DENIES: fevers  TAKING MEDS: Pt is on Bactrim for MRSA  A&O x4... NAD... Ambulatory

## 2017-12-17 ENCOUNTER — Telehealth: Payer: Self-pay | Admitting: Oncology

## 2017-12-17 NOTE — Telephone Encounter (Signed)
Rescheduled appointment that was missed

## 2017-12-18 ENCOUNTER — Emergency Department (HOSPITAL_COMMUNITY): Payer: Medicare Other

## 2017-12-18 ENCOUNTER — Encounter (HOSPITAL_COMMUNITY): Payer: Self-pay | Admitting: Emergency Medicine

## 2017-12-18 ENCOUNTER — Other Ambulatory Visit: Payer: Self-pay

## 2017-12-18 ENCOUNTER — Emergency Department (HOSPITAL_COMMUNITY)
Admission: EM | Admit: 2017-12-18 | Discharge: 2017-12-18 | Disposition: A | Discharge status: Home / Self Care | Payer: Medicare Other | Attending: Emergency Medicine | Admitting: Emergency Medicine

## 2017-12-18 DIAGNOSIS — R062 Wheezing: Secondary | ICD-10-CM | POA: Diagnosis not present

## 2017-12-18 DIAGNOSIS — Z8546 Personal history of malignant neoplasm of prostate: Secondary | ICD-10-CM | POA: Diagnosis not present

## 2017-12-18 DIAGNOSIS — R05 Cough: Secondary | ICD-10-CM | POA: Diagnosis present

## 2017-12-18 DIAGNOSIS — J069 Acute upper respiratory infection, unspecified: Secondary | ICD-10-CM | POA: Diagnosis not present

## 2017-12-18 DIAGNOSIS — Z87891 Personal history of nicotine dependence: Secondary | ICD-10-CM | POA: Insufficient documentation

## 2017-12-18 DIAGNOSIS — R0981 Nasal congestion: Secondary | ICD-10-CM | POA: Diagnosis not present

## 2017-12-18 MED ORDER — FEXOFENADINE HCL 60 MG PO TABS: 60.0000 mg | ORAL_TABLET | Freq: Two times a day (BID) | ORAL | 0 refills | Status: DC

## 2017-12-18 MED ORDER — CETIRIZINE HCL 5 MG/5ML PO SOLN
5.0000 mg | Freq: Once | ORAL | Status: AC
  Administered 2017-12-18: 5 mg via ORAL
  Filled 2017-12-18: qty 5

## 2017-12-18 MED ORDER — FLUTICASONE PROPIONATE 50 MCG/ACT NA SUSP: 2.0000 | Freq: Two times a day (BID) | NASAL | 0 refills | Status: DC

## 2017-12-18 MED ORDER — FLUTICASONE PROPIONATE 50 MCG/ACT NA SUSP
1.0000 | Freq: Every day | NASAL | Status: DC
  Administered 2017-12-18: 1 via NASAL
  Filled 2017-12-18: qty 16

## 2017-12-18 NOTE — ED Triage Notes (Signed)
Pt. Stated, Daniel Howard had this cold congestion and rattling in my chest for 2 weeks.

## 2017-12-18 NOTE — ED Notes (Signed)
Patient transported to x-ray. ?

## 2017-12-18 NOTE — ED Provider Notes (Signed)
Emergency Department Provider Note   I have reviewed the triage vital signs and the nursing notes.   HISTORY  Chief Complaint Nasal Congestion   HPI Daniel Howard is a 61 y.o. male with a history of MRSA currently on Bactrim also recently completed a course of azithromycin for community acquired pneumonia presents the emergency department today with persistent cough and congestion.  Patient states that he feels much better overall.  No fevers, nausea, vomiting or other systemic symptoms but has had persistent cough and this is worse at night which bothers his wife who asked him to come in to be reevaluated.  No shortness of breath or new chest pain.  No back pain or lower extremity swelling.  No history of MI or pulmonary embolus. Does have h/o GI issues but not bothering him currently. No other associated or modifying symptoms.    Past Medical History:  Diagnosis Date  . Abnormal finding of biliary tract    MRCP shows pancreatic/biliary tract dilation. EUS 2010 confirmed dilation but no chronic pancreaitis or mass. Vascular ectasia crimpoing distal CBD.   [name] Anxiety   . Cancer ([place])   . 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 Duodenal ulcer 2010   nsaids  . Low back pain   . Peristomal hernia   . Small bowel obstruction ([place]) 10/2017; 11/21/2017  . Spigelian hernia    bilateral    Patient Active Problem List   Diagnosis Date Noted  . Nausea with vomiting 11/10/2017  . Spigelian hernia 09/22/2017  . Abdominal pain of multiple sites 09/22/2017  . Goals of care, counseling/discussion 04/02/2017  . Prostate cancer ([place]) 04/02/2017  .  Elevated PSA 03/04/2017  . Osteopenia 11/03/2016  . Pelvic mass in male 09/04/2016  . 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 Exacerbation of Crohn's disease ([place]) 09/04/2016  . Trigger finger, acquired 10/31/2015  . Protein-calorie malnutrition, severe ([place]) 07/27/2014  . SBO (small bowel obstruction) ([place]) 07/26/2014  . Loss of weight 06/09/2014  . Hypotension, unspecified 06/09/2014  . Crohn's disease of both small and large intestine with complication ([place]) 87/56/4332  . Boils 02/11/2013  . Ventral hernia 12/11/2009  . Anemia 12/03/2009  . Regional enteritis/Crohn's 01/25/2007  . LOW BACK PAIN 01/25/2007    Past Surgical History:  Procedure Laterality Date  . APPENDECTOMY  1992   at time of exp laparotomy at which time he was noted to have fistulizing Crohn's rather than UC  . COLONOSCOPY [name] 08/23/2014   [id number] proctoscopy with possible fistulous opening in thebase of rectal/anal stump.    . COLOSTOMY  1992   transverse loop colostomy secondary to a stricture  . ESOPHAGOGASTRODUODENOSCOPY  05/2009   SLF: multiple antral erosions, large ulcer at ansatomosis (postsurgical changes at duodenal bulb and second portion of duodenum) BX c/x NSAIDS.  . EUS  10/04/2009   Dr. Estill Bakes dilated CBD and main pancreatic duct.  No pancreatic  . FLEXIBLE SIGMOIDOSCOPY  1988   Dr. Laural Golden- suggested rohn's disease but the biopsies were not collaborative.  [name] FLEXIBLE SIGMOIDOSCOPY [name] 09/08/2016   Procedure: FLEXIBLE SIGMOIDOSCOPY;  Surgeon: Wonda Horner, MD;  Location: [facility] ENDOSCOPY;  Service: Gastroenterology;  Laterality: [name];  . HEMICOLOECTOMY W/  ANASTOMOSIS  1993   R- Dr.DeMason   . HERNIA REPAIR  1996   incarcerated periostial hernia with additional surgery in 1999  . IR FLUORO GUIDE PORT INSERTION RIGHT  04/03/2017  . IR US GUIDE VASC ACCESS RIGHT  04/03/2017    Current Outpatient Rx  . Order #: [id number] Class: Normal  . Order #: [id number] Class: Normal  . Order #: [id number] Class: No  Print  . Order #: [id number] Class: Historical Med  . Order #: [id number] Class: Historical Med  . Order #: [id number] Class: Print  . Order #: [id number] Class: Print  . Order #: [id number] Class: Normal  . Order #: [id number] Class: Print  . Order #: [id number] Class: Historical Med  . Order #: [id number] Class: Normal  . Order #: [id number] Class: Historical Med  . Order #: [id number] Class: Normal  . Order #: [id number] Class: Normal  . Order #: [id number] Class: Historical Med  . Order #: [id number] Class: Normal    Allergies Penicillins  Family History  Problem Relation Age of Onset  . Cancer Father        prostate   . Prostate cancer Father   . Colon cancer Father 79  . Hypertension Sister   . Cancer Sister   . Depression Sister   . Breast cancer Sister   . COPD Sister   . Aneurysm Brother        deceased, brain aneurysm    Social History Social History   Tobacco Use  . Smoking status: Former Smoker    Packs/day: 0.50    Years: 2.00    Pack years: 1.00    Types: Cigarettes    Last attempt to quit: 12/21/2009    Years since quitting: 7.9  . Smokeless tobacco: Never Used  . Tobacco comment: Quit abut 20 years  Substance Use Topics  . Alcohol use: No    Alcohol/week: 0.0 oz    Comment: Former drinker  . Drug use: No    Review of Systems  All other systems negative except as documented in the HPI. All pertinent positives and negatives as reviewed in the HPI. ____________________________________________   PHYSICAL EXAM:  VITAL SIGNS: ED Triage Vitals  Enc Vitals Group     BP 12/18/17 0726 108/77     Pulse Rate 12/18/17 0726 77     Resp 12/18/17 0726 16     Temp 12/18/17 0726 (!) 97.3 F (36.3 C)     Temp Source 12/18/17 0726 Oral     SpO2 12/18/17 0726 100 %    Constitutional: Alert and oriented. Well appearing and in no acute distress. Eyes: Conjunctivae are normal. PERRL. EOMI. Head: Atraumatic. Nose: mild edema and congestion noted.  Mouth/Throat: Mucous  membranes are moist.  Oropharynx with mild posterior erythema. Neck: No stridor.  No meningeal signs.   Cardiovascular: Normal rate, regular rhythm. Good peripheral circulation. Grossly normal heart sounds.   Respiratory: Normal respiratory effort.  No retractions. Lungs CTAB. Gastrointestinal: Soft and nontender. No distention.  Musculoskeletal: No lower extremity tenderness nor edema. No gross deformities of extremities. Neurologic:  Normal speech and language. No gross focal neurologic deficits are appreciated.  Skin:  Skin is warm, dry and intact. No rash noted.   ____________________________________________   RADIOLOGY  Dg Chest 2 View  Result Date: 12/18/2017 CLINICAL DATA:  Chest wheezing. Evaluate for pneumonia. Crohn's disease. Prostate cancer. EXAM: CHEST  2 VIEW COMPARISON:  Two-view chest x-ray 12/11/2017. FINDINGS: Heart size is normal. A right IJ Port-A-Cath is stable in position. Left basilar atelectasis or scarring is stable. The lungs are  otherwise clear. There is no edema or effusion. The visualized soft tissues and bony thorax are unremarkable. IMPRESSION: 1. Stable scarring or atelectasis the left base. 2. Right IJ Port-A-Cath is stable. 3. No other acute cardiopulmonary disease. Electronically Signed   By: [place] M.D.   On: 12/18/2017 09:36    ____________________________________________   PROCEDURES  Procedure(s) performed:   Procedures   ____________________________________________   INITIAL IMPRESSION / ASSESSMENT AND PLAN / ED COURSE  Likely persistent PND but with overall improving symptoms. However does have h/o Crohns/cancer, will eval cxr to ensure no consolidation or concern for persistent bacterial infection. otherwise symptomatic treatment.   cxr ok. Will dc on flonase/allegra.   Pertinent labs & imaging results that were available during my care of the patient were reviewed by me and considered in my medical decision making (see  chart for details).  ____________________________________________  FINAL CLINICAL IMPRESSION(S) / ED DIAGNOSES  Final diagnoses:  Upper respiratory tract infection, unspecified type     MEDICATIONS GIVEN DURING THIS VISIT:  Medications  cetirizine HCl (Zyrtec) 5 MG/5ML solution 5 mg (5 mg Oral Given 12/18/17 0858)     NEW OUTPATIENT MEDICATIONS STARTED DURING THIS VISIT:  This SmartLink is deprecated. Use AVSMEDLIST instead to display the medication list for a patient.  Note:  This note was prepared with assistance of Dragon voice recognition software. Occasional wrong-word or sound-a-like substitutions may have occurred due to the inherent limitations of voice recognition software.   Merrily Pew, MD 12/18/17 [phone]

## 2017-12-18 NOTE — ED Triage Notes (Signed)
Went to UC a week ago and was given medication but is still lingering

## 2017-12-18 NOTE — ED Notes (Signed)
Patient transported to X-ray 

## 2017-12-23 ENCOUNTER — Ambulatory Visit: Payer: Medicare Other | Admitting: Family Medicine

## 2017-12-23 DIAGNOSIS — L738 Other specified follicular disorders: Secondary | ICD-10-CM | POA: Diagnosis not present

## 2017-12-23 DIAGNOSIS — L0291 Cutaneous abscess, unspecified: Secondary | ICD-10-CM | POA: Diagnosis not present

## 2017-12-24 ENCOUNTER — Telehealth: Payer: Self-pay

## 2017-12-24 NOTE — Telephone Encounter (Signed)
PA request was started on 12/24/2017 for Humira Pen Inj 40/0.4ML.  PA request approved through 12/21/2018. Approval letter scanned in chart.

## 2017-12-25 ENCOUNTER — Encounter: Payer: Self-pay | Admitting: Family Medicine

## 2017-12-25 ENCOUNTER — Other Ambulatory Visit: Payer: Self-pay

## 2017-12-25 ENCOUNTER — Ambulatory Visit (INDEPENDENT_AMBULATORY_CARE_PROVIDER_SITE_OTHER): Payer: Medicare Other | Admitting: Family Medicine

## 2017-12-25 VITALS — BP 122/70 | HR 96 | Temp 98.7°F | Resp 16 | Ht 64.5 in | Wt 136.6 lb

## 2017-12-25 DIAGNOSIS — J209 Acute bronchitis, unspecified: Secondary | ICD-10-CM | POA: Diagnosis not present

## 2017-12-25 DIAGNOSIS — K50819 Crohn's disease of both small and large intestine with unspecified complications: Secondary | ICD-10-CM | POA: Diagnosis not present

## 2017-12-25 NOTE — Progress Notes (Signed)
   Subjective:    Patient ID: Daniel Howard, male    DOB: Oct 25, 1956, 62 y.o.   MRN: [id number]  Patient presents for ER F/U (URI)    Seen at [facility] on 12/21 after having cough with congestion for a week, given azithromycin, He did not improve so went to ER 12/28. He had CXR which was negative, given allegra and flonase. No further drainage    Seen by surgeon- for the infected cyst of face, had I and D and packing placed, packing now out, he has final followup on the 24th   Request refill on pain medication       Review Of Systems:  GEN- denies fatigue, fever, weight loss,weakness, recent illness HEENT- denies eye drainage, change in vision, nasal discharge, CVS- denies chest pain, palpitations RESP- denies SOB, cough, wheeze ABD- denies N/V, change in stools, abd pain GU- denies dysuria, hematuria, dribbling, incontinence MSK- denies joint pain, muscle aches, injury Neuro- denies headache, dizziness, syncope, seizure activity       Objective:    BP 122/70   Pulse 96   Temp 98.7 F (37.1 C) (Oral)   Resp 16   Ht 5' 4.5" (1.638 m)   Wt 136 lb 9.6 oz (62 kg)   SpO2 97%   BMI 23.09 kg/m  GEN- NAD, alert and oriented x3 HEENT- PERRL, EOMI, non injected sclera, pink conjunctiva, MMM, oropharynx clear CVS- RRR, no murmur RESP-CTAB ABD-NABS,soft,NT,ND, colostomhy in tact, he does have a small stitch at surface in old incision EXT- No edema Pulses- Radial 2+        Assessment & Plan:      Problem List Items Addressed This Visit      Unprioritized   Crohn's disease of both small and large intestine with complication (HCC)    Chronic abdominal pain, will refill pain medications Recent obstructions, had increased use of his chronic pain meds       Other Visit Diagnoses    Acute bronchitis, unspecified organism    -  Primary   Resolved, can use allergy meds as needed now . regarding stitch, we have removed one before, this one is just at surface, but not  bothering him.So with recent infections including MRSA , will hold on removing this at this time.      Note: This dictation was prepared with Dragon dictation along with smaller phrase technology. Any transcriptional errors that result from this process are unintentional.

## 2017-12-25 NOTE — Patient Instructions (Addendum)
F/U As previous

## 2017-12-26 ENCOUNTER — Encounter: Payer: Self-pay | Admitting: Family Medicine

## 2017-12-26 MED ORDER — HYDROCODONE-ACETAMINOPHEN 5-325 MG PO TABS: 1.0000 | ORAL_TABLET | Freq: Four times a day (QID) | ORAL | 0 refills | Status: DC | PRN

## 2017-12-26 NOTE — Assessment & Plan Note (Signed)
Chronic abdominal pain, will refill pain medications Recent obstructions, had increased use of his chronic pain meds

## 2017-12-29 ENCOUNTER — Ambulatory Visit: Payer: Medicare Other | Admitting: Internal Medicine

## 2018-01-05 ENCOUNTER — Inpatient Hospital Stay: Payer: Medicare Other | Attending: Oncology | Admitting: Oncology

## 2018-01-05 ENCOUNTER — Telehealth: Payer: Self-pay | Admitting: Oncology

## 2018-01-05 VITALS — BP 117/74 | HR 69 | Temp 98.9°F | Resp 20 | Ht 64.5 in | Wt 148.5 lb

## 2018-01-05 DIAGNOSIS — C7951 Secondary malignant neoplasm of bone: Secondary | ICD-10-CM | POA: Diagnosis not present

## 2018-01-05 DIAGNOSIS — R9721 Rising PSA following treatment for malignant neoplasm of prostate: Secondary | ICD-10-CM | POA: Diagnosis not present

## 2018-01-05 DIAGNOSIS — Z9221 Personal history of antineoplastic chemotherapy: Secondary | ICD-10-CM

## 2018-01-05 DIAGNOSIS — Z79899 Other long term (current) drug therapy: Secondary | ICD-10-CM

## 2018-01-05 DIAGNOSIS — Z933 Colostomy status: Secondary | ICD-10-CM | POA: Diagnosis not present

## 2018-01-05 DIAGNOSIS — C61 Malignant neoplasm of prostate: Secondary | ICD-10-CM | POA: Diagnosis not present

## 2018-01-05 DIAGNOSIS — K5090 Crohn's disease, unspecified, without complications: Secondary | ICD-10-CM | POA: Diagnosis not present

## 2018-01-05 DIAGNOSIS — C778 Secondary and unspecified malignant neoplasm of lymph nodes of multiple regions: Secondary | ICD-10-CM | POA: Diagnosis not present

## 2018-01-05 NOTE — Telephone Encounter (Signed)
Gave avs and calendar for January- april

## 2018-01-05 NOTE — Progress Notes (Signed)
Hematology and Oncology Follow Up Visit  Daniel Howard [id number] Sep 18, 1956 62 y.o. 01/05/2018 11:17 AM Daniel Howard, [name], [name], MD   Principle Diagnosis: 62 year old  with prostate cancer diagnosed in March 2018. His PSA was 31.3 and a large pelvic mass measuring 7.7 x 7.2 cm. He has diffuse pelvic adenopathy and bony disease.   Prior Therapy:   He is status post biopsy on 03/16/2017 of a pelvic mass which confirmed the presence of prostate cancer.  Taxotere chemotherapy at 75 mg/m started on 04/24/2017. He S/P 6 cycles  of therapy completed in August 2018.  Current therapy:  Androgen deprivation therapy ongoing at [facility].    Interim History: Daniel Howard here for a follow-up visit. Since the last visit, he was hospitalized in December 2018 for small bowel obstruction.  Her symptoms has resolved since that time and denies any GI complaints.  He denies any abdominal pain, diarrhea or exacerbation of his Crohn's disease.  He denies any bone pain or pathological fractures.  He continues to receive androgen deprivation therapy at [facility] without any issues.  He denied any urination difficulties.  He does not report any headaches, blurry vision, syncope or seizures. He does not report any fevers, chills, sweats or weight loss. He does not report any chest pain, palpitation, orthopnea or leg edema. He does not report any cough, wheezing or hemoptysis. He does not report any nausea, vomiting or abdominal pain. He does not report any frequency urgency or hesitancy. He is not report any skeletal complaints of arthralgias or myalgias.  He does not report any skin rashes or lesions.  He denies any anxiety or depression.  Remaining review of systems is negative  Medications: I have reviewed the patient's current medications.  Current Outpatient Medications  Medication Sig Dispense Refill  . budesonide (ENTOCORT EC) 3 MG 24 hr capsule Take 3 capsules (9 mg total) by  mouth daily. 90 capsule 0  . calcium carbonate (OS-CAL) 600 MG TABS tablet Take 1,200 mg by mouth daily with breakfast.    . Ferrous Sulfate 28 MG TABS Take 1 tablet by mouth daily.    . fexofenadine (ALLEGRA) 60 MG tablet Take 1 tablet (60 mg total) by mouth 2 (two) times daily. 60 tablet 0  . fluticasone (FLONASE) 50 MCG/ACT nasal spray Place 2 sprays into both nostrils 2 (two) times daily. 16 g 0  . HYDROcodone-acetaminophen (NORCO/VICODIN) 5-325 MG tablet Take 1 tablet by mouth every 6 (six) hours as needed for moderate pain. 60 tablet 0  . lidocaine-prilocaine (EMLA) cream Apply 1 application topically as needed (when accessing port).    . mirtazapine (REMERON) 15 MG tablet TAKE 1 TABLET BY MOUTH AT BEDTIME (Patient taking differently: TAKE 15 mg TABLET BY MOUTH AT BEDTIME) 90 tablet 1  . Multiple Vitamins-Minerals (CENTRUM SILVER ADULT 50+) TABS Take 1 tablet by mouth daily.    . ondansetron (ZOFRAN) 4 MG tablet Take 1 tablet (4 mg total) by mouth every 8 (eight) hours as needed for nausea or vomiting. 30 tablet 1  . Simethicone (GAS-X PO) Take by mouth as needed.     No current facility-administered medications for this visit.    Facility-Administered Medications Ordered in Other Visits  Medication Dose Route Frequency Provider Last Rate Last Dose  . sodium chloride flush (NS) 0.9 % injection 10 mL  10 mL Intravenous PRN Daniel Portela, MD   10 mL at 11/20/17 9163     Allergies:  Allergies  Allergen  Reactions  . Penicillins Hives    Has patient had a PCN reaction causing immediate rash, facial/tongue/throat swelling, SOB or lightheadedness with hypotension: Yes Has patient had a PCN reaction causing severe rash involving mucus membranes or skin necrosis: No Has patient had a PCN reaction that required hospitalization: No Has patient had a PCN reaction occurring within the last 10 years: No If all of the above answers are "NO", then may proceed with Cephalosporin use.     Past  Medical History, Surgical history, Social history, and Family History is unchanged after reviewing it today.  Physical Exam: Blood pressure 117/74, pulse 69, temperature 98.9 F (37.2 C), temperature source Oral, resp. rate 20, height 5' 4.5" (1.638 m), weight 148 lb 8 oz (67.4 kg), SpO2 100 %. ECOG: 1 General appearance: Alert, awake gentleman without distress. Head: Normocephalic, without obvious abnormality  Oral mucosa without ulcers or thrush. Eyes: No scleral icterus. Lymph nodes: Cervical, supraclavicular, and axillary nodes normal. Heart:regular rate and rhythm, S1, S2 normal, no murmur, click, rub or gallop Lung:chest clear, no wheezing, rales, normal symmetric air entry Abdomin: soft, non-tender, without masses or organomegaly.  Without shifting dullness or ascites. Skeletal: No joint tenderness or effusion.   Lab Results: Lab Results  Component Value Date   WBC 11.1 (H) 12/02/2017   HGB 10.1 (L) 12/02/2017   HCT 30.5 (L) 12/02/2017   MCV 90.5 12/02/2017   PLT 286 12/02/2017     Chemistry      Component Value Date/Time   NA 136 12/02/2017 1249   NA 137 11/20/2017 0931   K 4.9 12/02/2017 1249   K 3.3 (L) 11/20/2017 0931   CL 102 12/02/2017 1249   CO2 27 12/02/2017 1249   CO2 27 11/20/2017 0931   BUN 15 12/02/2017 1249   BUN 16.6 11/20/2017 0931   CREATININE 0.99 12/02/2017 1249   CREATININE 1.2 11/20/2017 0931      Component Value Date/Time   CALCIUM 9.0 12/02/2017 1249   CALCIUM 8.9 11/20/2017 0931   ALKPHOS 72 11/21/2017 1944   ALKPHOS 62 11/20/2017 0931   AST 25 12/02/2017 1249   AST 38 (H) 11/20/2017 0931   ALT 52 (H) 12/02/2017 1249   ALT 45 11/20/2017 0931   BILITOT 0.5 12/02/2017 1249   BILITOT 0.66 11/20/2017 0931     [name] (MRN [id number]) as of 01/05/2018 11:20  Ref. Range 09/21/2017 09:21 11/20/2017 09:31  Prostate Specific Ag, Serum Latest Ref Range: 0.0 - 4.0 ng/mL 0.3 0.8     IMPRESSION: 1. Continued severe small  bowel dilatation/small bowel obstruction without interval improvement since the prior study. No free air. No pneumatosis. Slight decreased mucosal enhancement at the right lower quadrant ostomy, but with residual small bowel loops in the right abdomen near the ostomy demonstrating prominent mucosal enhancement, suspicious for active disease. 2. Grossly stable infiltrative prostate mass with local invasion of adjacent structures. Numerous pelvic skeletal metastatic lesions re- demonstrated. 3. Large left abdominal hernia containing mesentery and bowel ; a slightly enlarged fluid-filled loops of small bowel in the hernia sac appears stable. 4. Re- demonstrated enlarged right lower quadrant lymph nodes.     Impression and Plan:   62 year old gentleman with the following issues:  1.  Metastatic prostate cancer diagnosed in March 2018. He was found to have diffuse adenopathy as well as diffuse bony disease. He underwent a biopsy of a 7.7 x 7.2 mass of the right hemipelvis which confirmed the presence of prostate cancer. His  PSA is 31.3.  He is S/P Taxotere chemotherapy at 75 mg/m every 3 weeks for a total of 6 cycles completed in August 2018 with a PSA nadir of 0.3.  His CT scan on November 21, 2017 was personally reviewed and discussed with the patient.  He showed no evidence of recurrent disease I recommended continued observation and surveillance for the time being.  The natural course of prostate cancer was reviewed today with the patient and his family.  He remains on androgen deprivation therapy although he is experiencing slow PSA rise.  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  These options will be chemotherapy versus different second line hormone therapy.   2. IV access: Port-A-Cath will be maintained for the time being.  This will be flushed every 6 weeks.  3.  Crohn's disease: No recent flares noted at this time.   Continues to be managed by gastroenterology.  4. Bone directed therapy: This options will be deferred unless he develop worsening bony disease.  5. Androgen deprivation therapy: He is currently receiving that at [facility].  I have recommended continuing this therapy indefinitely.  6. Follow-up: Will be in 12 weeks to follow his progress.  15 minutes were spent face-to-face with the patient today.  More than 50% were spent on counseling, education and coordination of his care.    Zola Button, MD 1/15/201911:17 AM

## 2018-01-06 ENCOUNTER — Telehealth: Payer: Self-pay

## 2018-01-06 NOTE — Telephone Encounter (Signed)
I'm not aware that were taking care of his colostomy supplies. If he needs something that has been working, I would be glad to sign off on the prescription.

## 2018-01-06 NOTE — Telephone Encounter (Signed)
Received a faxed order from [facility] for a Drainable with barrier pouch for pt.   Spoke with pt and pt said Dr. Gala Romney is the only doctor that manages his colostomy care.   I didn't see a previous order to order supplies for pt.   A copy of the order is ready to be signed if your the prescribing physician. Order was placed in RMR's office.

## 2018-01-07 NOTE — Telephone Encounter (Signed)
Noted. Paperwork is in RMR office to sign.

## 2018-01-12 ENCOUNTER — Ambulatory Visit (INDEPENDENT_AMBULATORY_CARE_PROVIDER_SITE_OTHER): Payer: Medicare Other | Admitting: Family Medicine

## 2018-01-12 ENCOUNTER — Encounter: Payer: Self-pay | Admitting: Family Medicine

## 2018-01-12 ENCOUNTER — Other Ambulatory Visit: Payer: Self-pay

## 2018-01-12 VITALS — BP 122/60 | HR 82 | Temp 97.9°F | Resp 16 | Ht 64.5 in | Wt 146.0 lb

## 2018-01-12 DIAGNOSIS — Z13220 Encounter for screening for lipoid disorders: Secondary | ICD-10-CM | POA: Diagnosis not present

## 2018-01-12 DIAGNOSIS — M8580 Other specified disorders of bone density and structure, unspecified site: Secondary | ICD-10-CM | POA: Diagnosis not present

## 2018-01-12 DIAGNOSIS — Z1159 Encounter for screening for other viral diseases: Secondary | ICD-10-CM | POA: Diagnosis not present

## 2018-01-12 DIAGNOSIS — H9193 Unspecified hearing loss, bilateral: Secondary | ICD-10-CM

## 2018-01-12 DIAGNOSIS — Z0000 Encounter for general adult medical examination without abnormal findings: Secondary | ICD-10-CM | POA: Diagnosis not present

## 2018-01-12 DIAGNOSIS — Z13818 Encounter for screening for other digestive system disorders: Secondary | ICD-10-CM | POA: Diagnosis not present

## 2018-01-12 NOTE — Patient Instructions (Addendum)
I recommend eye visit once a year I recommend dental visit every 6 months Goal is to  Exercise 30 minutes 5 days a week We will call  with lab results  CHANGE F/U TO 6 MONTHS

## 2018-01-12 NOTE — Progress Notes (Signed)
Subjective:   Patient presents for Medicare Annual/Subsequent preventive examination.   Pt here for wellness exam    Followed by GI for his colostomy, history of SBO  Followed by Urology for prostate cancer   Due for lipid panel  And Hep C screening   Review Past Medical/Family/Social: PER EMR   Risk Factors  Current exercise habits: walks  Dietary issues discussed: Maintaining weight   Cardiac risk factors: Obesity (BMI >= 30 kg/m2).   Depression Screen  (Note: if answer to either of the following is "Yes", a more complete depression screening is indicated)  Over the past two weeks, have you felt down, depressed or hopeless? No Over the past two weeks, have you felt little interest or pleasure in doing things? No Have you lost interest or pleasure in daily life? No Do you often feel hopeless? No Do you cry easily over simple problems? No   Activities of Daily Living  In your present state of health, do you have any difficulty performing the following activities?:  Driving? No  Managing money? No  Feeding yourself? No  Getting from bed to chair? No  Climbing a flight of stairs? No  Preparing food and eating?: No  Bathing or showering? No  Getting dressed: No  Getting to the toilet? No  Using the toilet:No  Moving around from place to place: No  In the past year have you fallen or had a near fall?:No  Are you sexually active? No  Do you have more than one partner? No   Hearing Difficulties: No  Do you often ask people to speak up or repeat themselves? No  Do you experience ringing or noises in your ears? No Do you have difficulty understanding soft or whispered voices? No  Do you feel that you have a problem with memory? No Do you often misplace items? No  Do you feel safe at home? Yes  Cognitive Testing  Alert? Yes Normal Appearance?Yes  Oriented to person? Yes Place? Yes  Time? Yes  Recall of three objects? Yes  Can perform simple calculations? Yes  Displays  appropriate judgment?Yes  Can read the correct time from a watch face?Yes   List the Names of Other Physician/Practitioners you currently use:   GI, Urology   Indicate any recent Medical Services you may have received from other than [facility] providers in the past year (date may be approximate).   Screening Tests / Date Colonoscopy      UTD               Influenza Vaccine  UTD Tetanus/tdap- unable due to cost Shingles- Unable due to high dose steroids and immunosuppresant for Crohns                  ROS: GEN- denies fatigue, fever, weight loss,weakness, recent illness HEENT- denies eye drainage, change in vision, nasal discharge, CVS- denies chest pain, palpitations RESP- denies SOB, cough, wheeze ABD- denies N/V, change in stools, abd pain GU- denies dysuria, hematuria, dribbling, incontinence MSK- denies joint pain, muscle aches, injury Neuro- denies headache, dizziness, syncope, seizure activity  PHYSICAL: GEN- NAD, alert and oriented x3 HEENT- PERRL, EOMI, non injected sclera, pink conjunctiva, MMM, oropharynx clear Neck- Supple, no thryomegaly CVS- RRR, no murmur RESP-CTAB ABD-NABS,soft,nT,ND, stitch not visible, colostomy in tact  Skin- healed incision right side of face  EXT- No edema Pulses- Radial, DP- 2+   Assessment:    Annual wellness medicare exam   Plan:    During  the course of the visit the patient was educated and counseled about appropriate screening and preventive services including:  TDAP declines No shingles due to high dose steroid, immuncompromised  Screen Neg  for depression.   Discussed Advanced directievs  Osteopenia- Bone Density due in Nov 2019   Mild hearing loss Hearing screen- passed on  higher Decibals, will monitor for now  Diet review for nutrition referral? Yes ____ Not Indicated __x__  Patient Instructions (the written plan) was given to the patient.  Medicare Attestation  I have personally reviewed:  The patient's medical and  social history  Their use of alcohol, tobacco or illicit drugs  Their current medications and supplements  The patient's functional ability including ADLs,fall risks, home safety risks, cognitive, and hearing and visual impairment  Diet and physical activities  Evidence for depression or mood disorders  The patient's weight, height, BMI, and visual acuity have been recorded in the chart. I have made referrals, counseling, and provided education to the patient based on review of the above and I have provided the patient with a written personalized care plan for preventive services.

## 2018-01-13 ENCOUNTER — Inpatient Hospital Stay: Payer: Medicare Other

## 2018-01-13 DIAGNOSIS — Z79899 Other long term (current) drug therapy: Secondary | ICD-10-CM | POA: Diagnosis not present

## 2018-01-13 DIAGNOSIS — C61 Malignant neoplasm of prostate: Secondary | ICD-10-CM

## 2018-01-13 DIAGNOSIS — C778 Secondary and unspecified malignant neoplasm of lymph nodes of multiple regions: Secondary | ICD-10-CM | POA: Diagnosis not present

## 2018-01-13 DIAGNOSIS — R9721 Rising PSA following treatment for malignant neoplasm of prostate: Secondary | ICD-10-CM | POA: Diagnosis not present

## 2018-01-13 DIAGNOSIS — C7951 Secondary malignant neoplasm of bone: Secondary | ICD-10-CM | POA: Diagnosis not present

## 2018-01-13 DIAGNOSIS — K5090 Crohn's disease, unspecified, without complications: Secondary | ICD-10-CM | POA: Diagnosis not present

## 2018-01-13 DIAGNOSIS — Z9221 Personal history of antineoplastic chemotherapy: Secondary | ICD-10-CM | POA: Diagnosis not present

## 2018-01-13 LAB — CMP (CANCER CENTER ONLY)
ALK PHOS: 54 U/L (ref 40–150)
ALT: 17 U/L (ref 0–55)
AST: 17 U/L (ref 5–34)
Albumin: 3.1 g/dL — ABNORMAL LOW (ref 3.5–5.0)
Anion gap: 5 (ref 3–11)
BUN: 15 mg/dL (ref 7–26)
CALCIUM: 8.7 mg/dL (ref 8.4–10.4)
CO2: 25 mmol/L (ref 22–29)
Chloride: 107 mmol/L (ref 98–109)
Creatinine: 1.04 mg/dL (ref 0.70–1.30)
GFR, Estimated: >60 mL/min (ref >60)
Glucose, Bld: 90 mg/dL (ref 70–140)
Potassium: 4.2 mmol/L (ref 3.5–5.1)
SODIUM: 137 mmol/L (ref 136–145)
Total Bilirubin: 0.4 mg/dL (ref 0.2–1.2)
Total Protein: 6.9 g/dL (ref 6.4–8.3)

## 2018-01-13 LAB — CBC WITH DIFFERENTIAL (CANCER CENTER ONLY)
Basophils Absolute: 0 10*3/uL (ref 0.0–0.1)
Basophils Relative: 1 %
EOS ABS: 0.4 10*3/uL (ref 0.0–0.5)
EOS PCT: 5 %
HCT: 32.3 % — ABNORMAL LOW (ref 38.4–49.9)
HEMOGLOBIN: 10.4 g/dL — AB (ref 13.0–17.1)
LYMPHS ABS: 2.4 10*3/uL (ref 0.9–3.3)
Lymphocytes Relative: 30 %
MCH: 32.5 pg (ref 27.2–33.4)
MCHC: 32.2 g/dL (ref 32.0–36.0)
MCV: 100.9 fL — ABNORMAL HIGH (ref 79.3–98.0)
MONOS PCT: 7 %
Monocytes Absolute: 0.5 10*3/uL (ref 0.1–0.9)
Neutro Abs: 4.6 10*3/uL (ref 1.5–6.5)
Neutrophils Relative %: 59 %
PLATELETS: 274 10*3/uL (ref 140–400)
RBC: 3.2 MIL/uL — ABNORMAL LOW (ref 4.20–5.82)
RDW: 14.5 % (ref 11.0–15.6)
WBC Count: 7.9 10*3/uL (ref 4.0–10.3)

## 2018-01-13 LAB — LIPID PANEL
CHOL/HDL RATIO: 1.6 (calc) (ref <5.0)
Cholesterol: 176 mg/dL (ref <200)
HDL: 110 mg/dL (ref >40)
LDL CHOLESTEROL (CALC): 48 mg/dL
NON-HDL CHOLESTEROL (CALC): 66 mg/dL (ref <130)
TRIGLYCERIDES: 92 mg/dL (ref <150)

## 2018-01-13 LAB — HEPATITIS C ANTIBODY
Hepatitis C Ab: NONREACTIVE
SIGNAL TO CUT-OFF: 0.03 (ref <1.00)

## 2018-01-13 MED ORDER — HEPARIN SOD (PORK) LOCK FLUSH 100 UNIT/ML IV SOLN
250.0000 [IU] | Freq: Once | INTRAVENOUS | Status: AC | PRN
  Administered 2018-01-13: 250 [IU]
  Filled 2018-01-13: qty 5

## 2018-01-13 MED ORDER — SODIUM CHLORIDE 0.9% FLUSH
10.0000 mL | INTRAVENOUS | Status: DC | PRN
  Administered 2018-01-13: 10 mL
  Filled 2018-01-13: qty 10

## 2018-01-14 ENCOUNTER — Encounter: Payer: Self-pay | Admitting: *Deleted

## 2018-01-14 LAB — PROSTATE-SPECIFIC AG, SERUM (LABCORP): Prostate Specific Ag, Serum: 0.3 ng/mL (ref 0.0–4.0)

## 2018-01-28 DIAGNOSIS — Z933 Colostomy status: Secondary | ICD-10-CM | POA: Diagnosis not present

## 2018-01-28 DIAGNOSIS — K5090 Crohn's disease, unspecified, without complications: Secondary | ICD-10-CM | POA: Diagnosis not present

## 2018-02-02 ENCOUNTER — Ambulatory Visit (INDEPENDENT_AMBULATORY_CARE_PROVIDER_SITE_OTHER): Payer: Medicare Other | Admitting: Internal Medicine

## 2018-02-02 ENCOUNTER — Encounter: Payer: Self-pay | Admitting: Internal Medicine

## 2018-02-02 ENCOUNTER — Other Ambulatory Visit: Payer: Self-pay

## 2018-02-02 VITALS — BP 123/78 | HR 91 | Temp 97.5°F | Ht 64.5 in | Wt 151.8 lb

## 2018-02-02 DIAGNOSIS — K50819 Crohn's disease of both small and large intestine with unspecified complications: Secondary | ICD-10-CM

## 2018-02-02 MED ORDER — BUDESONIDE 3 MG PO CPEP: 6.0000 mg | ORAL_CAPSULE | Freq: Every day | ORAL | 3 refills | Status: DC

## 2018-02-02 NOTE — Patient Instructions (Signed)
Continue Entocort 6 mg daily (disp #180) with 3 refills  Avoid NSAID drugs like advil and ibuprofen  Office visit in 4 months

## 2018-02-02 NOTE — Progress Notes (Signed)
Primary Care Physician:  Alycia Rossetti, MD Primary Gastroenterologist:  Dr. Gala Romney  Pre-Procedure History & Physical: HPI:  Daniel Howard is a 62 y.o. male here for follow-up complicated ileocolonic Crohn's disease. History of metastatic prostate cancer undergoing androgen deprivation therapy. Patient has been maintained on Entocort more or less chronically as other available therapies not appropriate.  He ran out of Entocort 2 weeks ago continues to do well. He did not call in. Ostomy is functioning well. Hasn't had any abdominal pain, nausea or vomiting. He's gained about 7 pounds since he was last seen here in early November 2018. He feels well. He is accompanied by his wife.  Apparently, developed MRSA and a suture granuloma site abdominal wall and ended upwith it on his face. That reportedly has been effectively treated.     Past Medical History:  Diagnosis Date  . Abnormal finding of biliary tract    MRCP shows pancreatic/biliary tract dilation. EUS 2010 confirmed dilation but no chronic pancreaitis or mass. Vascular ectasia crimpoing distal CBD.   [name] Anxiety   . Cancer ([place])   . 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 Duodenal ulcer 2010   nsaids  . Low back pain   . Peristomal hernia   . Small bowel obstruction ([place]) 10/2017; 11/21/2017  . Spigelian hernia    bilateral    Past Surgical History:  Procedure Laterality Date  . APPENDECTOMY  1992   at time of exp laparotomy at which time he was noted to have fistulizing Crohn's rather than UC  . COLONOSCOPY [name] 08/23/2014   [id number] proctoscopy with possible fistulous opening  in thebase of rectal/anal stump.    . COLOSTOMY  1992   transverse loop colostomy secondary to a stricture  . ESOPHAGOGASTRODUODENOSCOPY  05/2009   SLF: multiple antral erosions, large ulcer at ansatomosis (postsurgical changes at duodenal bulb and second portion of duodenum) BX c/x NSAIDS.  . EUS  10/04/2009   Dr. Estill Bakes dilated CBD and main pancreatic duct.  No pancreatic  . FLEXIBLE SIGMOIDOSCOPY  1988   Dr. Laural Golden- suggested rohn's disease but the biopsies were not collaborative.  [name] FLEXIBLE SIGMOIDOSCOPY [name] 09/08/2016   Procedure: FLEXIBLE SIGMOIDOSCOPY;  Surgeon: Wonda Horner, MD;  Location: [facility] ENDOSCOPY;  Service: Gastroenterology;  Laterality: [name];  . HEMICOLOECTOMY W/ ANASTOMOSIS  1993   R- Dr.DeMason   . HERNIA REPAIR  1996   incarcerated periostial hernia with additional surgery in 1999  . IR FLUORO GUIDE PORT INSERTION RIGHT  04/03/2017  . IR US GUIDE VASC ACCESS RIGHT  04/03/2017    Prior to Admission medications   Medication Sig Start Date End Date Taking? Authorizing Provider  calcium carbonate (OS-CAL) 600 MG TABS tablet Take 1,200 mg by mouth daily with breakfast.   Yes [provider]  Ferrous Sulfate 28 MG TABS Take 1 tablet by mouth daily.   Yes [provider]  HYDROcodone-acetaminophen (NORCO/VICODIN) 5-325 MG tablet Take 1 tablet by mouth every 6 (six) hours as needed for moderate pain. 12/26/17  Yes [place], [name], MD  lidocaine-prilocaine (EMLA) cream Apply 1 application topically as needed (when accessing port).   Yes [provider]  mirtazapine (REMERON)  15 MG tablet TAKE 1 TABLET BY MOUTH AT BEDTIME Patient taking differently: TAKE 15 mg TABLET BY MOUTH AT BEDTIME 10/14/17  Yes [place], [name], MD  Multiple Vitamins-Minerals (CENTRUM SILVER ADULT 50+) TABS Take 1 tablet by mouth daily.   Yes [provider]  budesonide (ENTOCORT EC) 3 MG 24 hr capsule Take 3 capsules (9 mg total) by mouth daily. Patient not taking:  Reported on 02/02/2018 12/02/17   Alycia Rossetti, MD  fluticasone [facility]) 50 MCG/ACT nasal spray Place 2 sprays into both nostrils 2 (two) times daily. Patient not taking: Reported on 02/02/2018 12/18/17   Merrily Pew, MD    Allergies as of 02/02/2018 - Review Complete 02/02/2018  Allergen Reaction Noted  . Penicillins Hives 01/25/2007    Family History  Problem Relation Age of Onset  . Cancer Father        prostate   . Prostate cancer Father   . Colon cancer Father 26  . Hypertension Sister   . Cancer Sister   . Depression Sister   . Breast cancer Sister   . COPD Sister   . Aneurysm Brother        deceased, brain aneurysm    Social History   Socioeconomic History  . Marital status: Married    Spouse name: Not on file  . Number of children: 40  . Years of education: Not on file  . Highest education level: Not on file  Social Needs  . Financial resource strain: Not on file  . Food insecurity - worry: Not on file  . Food insecurity - inability: Not on file  . Transportation needs - medical: Not on file  . Transportation needs - non-medical: Not on file  Occupational History  . Occupation: disabled    Fish farm manager: UNEMPLOYED  Tobacco Use  . Smoking status: Former Smoker    Packs/day: 0.50    Years: 2.00    Pack years: 1.00    Types: Cigarettes    Last attempt to quit: 12/21/2009    Years since quitting: 8.1  . Smokeless tobacco: Never Used  . Tobacco comment: Quit abut 20 years  Substance and Sexual Activity  . Alcohol use: No    Alcohol/week: 0.0 oz    Comment: Former drinker  . Drug use: No  . Sexual activity: Yes    Partners: Female  Other Topics Concern  . Not on file  Social History Narrative  . Not on file    Review of Systems: See HPI, otherwise negative ROS  Physical Exam: BP 123/78   Pulse 91   Temp (!) 97.5 F (36.4 C) (Oral)   Ht 5' 4.5" (1.638 m)   Wt 151 lb 12.8 oz (68.9 kg)   BMI 25.65 kg/m  General:   Alert,   pleasant and  cooperative in NAD Neck:  Supple; no masses or thyromegaly. No significant cervical adenopathy. Lungs:  Clear throughout to auscultation.   No wheezes, crackles, or rhonchi. No acute distress. Heart:  Regular rate and rhythm; no murmurs, clicks, rubs,  or gallops. Abdomen: Multiple keloid surgical scars. Multiple abdominal wall hernia is reducible. Abdomen is soft and nontender otherwise without appreciable mass or organomegaly. Pulses:  Normal pulses noted. Extremities:  Without clubbing or edema.  Impression:  A very pleasant 62 year old gentleman which challenging medical problems including long history of complicated Crohn's disease and metastatic prostate cancer. Clinically, Crohn's disease is doing as well as can be expected. However,  It remains  to be seen  whether or not he has a deep clinical remission. Discussed the long-term use of budesonide Along with risks and benefits. Would like to see him get by with the least amount of medication as possible. Other therapeutic options including immunosuppressive medications relatively contraindicated in this clinical setting.  Recommendations:  Repeat Entocort at a dose of 6 mg daily (disp #180) with 3 refills  Avoid NSAID drugs like advil and ibuprofen  Office visit in 4 months  Patient is to call if he has any interim problems.            Notice: This dictation was prepared with Dragon dictation along with smaller phrase technology. Any transcriptional errors that result from this process are unintentional and may not be corrected upon review.

## 2018-02-15 ENCOUNTER — Telehealth: Payer: Self-pay | Admitting: Internal Medicine

## 2018-02-15 ENCOUNTER — Other Ambulatory Visit: Payer: Self-pay | Admitting: Family Medicine

## 2018-02-15 MED ORDER — HYDROCODONE-ACETAMINOPHEN 5-325 MG PO TABS: 1.0000 | ORAL_TABLET | Freq: Four times a day (QID) | ORAL | 0 refills | Status: DC | PRN

## 2018-02-15 NOTE — Telephone Encounter (Signed)
Pt's wife was returning a call. I told her the nurse would have to call her back that she wasn't available at the moment.

## 2018-02-15 NOTE — Telephone Encounter (Signed)
Spoke with pt and he would like to increase back to take Entocort 3 mg (3 tabs daily). Pt is taking 2 pills daily and continues to get gas in his stomach that wont release itself. Pt also doesn't get much in his colostomy bag. When pt takes the 3 pills, he doesn't have the stomach pain or problems with the colostomy bag.

## 2018-02-15 NOTE — Telephone Encounter (Signed)
Lmom, waiting on a return call.  

## 2018-02-15 NOTE — Telephone Encounter (Signed)
Please call patient at [phone] He has questions about his medication.

## 2018-02-15 NOTE — Telephone Encounter (Signed)
Spoke with pts spouse, pt will increase to 3 tabs of Entocort.

## 2018-02-15 NOTE — Telephone Encounter (Signed)
Ok to refill??  Last office visit 01/12/2018.  Last refill 12/26/2017.

## 2018-02-15 NOTE — Telephone Encounter (Signed)
Communication noted. Let's increase Entocort 9 mg daily.

## 2018-02-15 NOTE — Telephone Encounter (Signed)
Hydrocodone refill to [facility]

## 2018-02-16 ENCOUNTER — Inpatient Hospital Stay: Payer: Medicare Other | Attending: Oncology

## 2018-02-16 ENCOUNTER — Emergency Department (HOSPITAL_COMMUNITY): Payer: Medicare Other

## 2018-02-16 ENCOUNTER — Other Ambulatory Visit: Payer: Self-pay

## 2018-02-16 ENCOUNTER — Encounter (HOSPITAL_COMMUNITY): Payer: Self-pay | Admitting: Emergency Medicine

## 2018-02-16 ENCOUNTER — Observation Stay (HOSPITAL_COMMUNITY)
Admission: EM | Admit: 2018-02-16 | Discharge: 2018-02-18 | Disposition: A | Discharge status: Home / Self Care | Payer: Medicare Other | Attending: Internal Medicine | Admitting: Internal Medicine

## 2018-02-16 DIAGNOSIS — C61 Malignant neoplasm of prostate: Secondary | ICD-10-CM | POA: Diagnosis not present

## 2018-02-16 DIAGNOSIS — Z87891 Personal history of nicotine dependence: Secondary | ICD-10-CM | POA: Insufficient documentation

## 2018-02-16 DIAGNOSIS — D649 Anemia, unspecified: Secondary | ICD-10-CM | POA: Insufficient documentation

## 2018-02-16 DIAGNOSIS — Z452 Encounter for adjustment and management of vascular access device: Secondary | ICD-10-CM | POA: Diagnosis not present

## 2018-02-16 DIAGNOSIS — Z880 Allergy status to penicillin: Secondary | ICD-10-CM | POA: Insufficient documentation

## 2018-02-16 DIAGNOSIS — K5090 Crohn's disease, unspecified, without complications: Secondary | ICD-10-CM | POA: Diagnosis present

## 2018-02-16 DIAGNOSIS — Z79899 Other long term (current) drug therapy: Secondary | ICD-10-CM | POA: Insufficient documentation

## 2018-02-16 DIAGNOSIS — K439 Ventral hernia without obstruction or gangrene: Secondary | ICD-10-CM | POA: Insufficient documentation

## 2018-02-16 DIAGNOSIS — R109 Unspecified abdominal pain: Secondary | ICD-10-CM | POA: Diagnosis not present

## 2018-02-16 DIAGNOSIS — Z432 Encounter for attention to ileostomy: Secondary | ICD-10-CM | POA: Insufficient documentation

## 2018-02-16 DIAGNOSIS — K56600 Partial intestinal obstruction, unspecified as to cause: Principal | ICD-10-CM | POA: Insufficient documentation

## 2018-02-16 DIAGNOSIS — C778 Secondary and unspecified malignant neoplasm of lymph nodes of multiple regions: Secondary | ICD-10-CM | POA: Diagnosis present

## 2018-02-16 DIAGNOSIS — K56609 Unspecified intestinal obstruction, unspecified as to partial versus complete obstruction: Secondary | ICD-10-CM | POA: Diagnosis present

## 2018-02-16 HISTORY — DX: Malignant neoplasm of prostate: C61

## 2018-02-16 HISTORY — DX: Personal history of other medical treatment: Z92.89

## 2018-02-16 LAB — CBC
HCT: 34.9 % — ABNORMAL LOW (ref 39.0–52.0)
Hemoglobin: 11.5 g/dL — ABNORMAL LOW (ref 13.0–17.0)
MCH: 31.3 pg (ref 26.0–34.0)
MCHC: 33 g/dL (ref 30.0–36.0)
MCV: 94.8 fL (ref 78.0–100.0)
Platelets: 317 10*3/uL (ref 150–400)
RBC: 3.68 MIL/uL — ABNORMAL LOW (ref 4.22–5.81)
RDW: 12.6 % (ref 11.5–15.5)
WBC: 9.2 10*3/uL (ref 4.0–10.5)

## 2018-02-16 LAB — COMPREHENSIVE METABOLIC PANEL
ALBUMIN: 3.2 g/dL — AB (ref 3.5–5.0)
ALT: 13 U/L — ABNORMAL LOW (ref 17–63)
ANION GAP: 13 (ref 5–15)
AST: 23 U/L (ref 15–41)
Alkaline Phosphatase: 60 U/L (ref 38–126)
BUN: 14 mg/dL (ref 6–20)
CO2: 24 mmol/L (ref 22–32)
Calcium: 9 mg/dL (ref 8.9–10.3)
Chloride: 98 mmol/L — ABNORMAL LOW (ref 101–111)
Creatinine, Ser: 1.19 mg/dL (ref 0.61–1.24)
GFR calc Af Amer: >60 mL/min (ref >60)
Glucose, Bld: 81 mg/dL (ref 65–99)
POTASSIUM: 4 mmol/L (ref 3.5–5.1)
Sodium: 135 mmol/L (ref 135–145)
Total Bilirubin: 1.2 mg/dL (ref 0.3–1.2)
Total Protein: 7.8 g/dL (ref 6.5–8.1)

## 2018-02-16 LAB — LIPASE, BLOOD: Lipase: 19 U/L (ref 11–51)

## 2018-02-16 LAB — URINALYSIS, ROUTINE W REFLEX MICROSCOPIC
Bilirubin Urine: NEGATIVE
Glucose, UA: NEGATIVE mg/dL
Hgb urine dipstick: NEGATIVE
Ketones, ur: 20 mg/dL — AB
Leukocytes, UA: NEGATIVE
Nitrite: NEGATIVE
Protein, ur: NEGATIVE mg/dL
Specific Gravity, Urine: 1.013 (ref 1.005–1.030)
pH: 5 (ref 5.0–8.0)

## 2018-02-16 MED ORDER — IOPAMIDOL (ISOVUE-300) INJECTION 61%
INTRAVENOUS | Status: AC
  Administered 2018-02-16: 100 mL
  Filled 2018-02-16: qty 100

## 2018-02-16 MED ORDER — SODIUM CHLORIDE 0.9% FLUSH
10.0000 mL | INTRAVENOUS | Status: DC | PRN
Start: 2018-02-16 — End: 2018-02-16
  Administered 2018-02-16: 10 mL
  Filled 2018-02-16: qty 10

## 2018-02-16 MED ORDER — ONDANSETRON HCL 4 MG/2ML IJ SOLN
4.0000 mg | Freq: Once | INTRAMUSCULAR | Status: AC
  Administered 2018-02-16: 4 mg via INTRAVENOUS
  Filled 2018-02-16: qty 2

## 2018-02-16 MED ORDER — HEPARIN SOD (PORK) LOCK FLUSH 100 UNIT/ML IV SOLN
500.0000 [IU] | Freq: Once | INTRAVENOUS | Status: AC | PRN
  Administered 2018-02-16: 500 [IU]
  Filled 2018-02-16: qty 5

## 2018-02-16 MED ORDER — MORPHINE SULFATE (PF) 4 MG/ML IV SOLN
4.0000 mg | Freq: Once | INTRAVENOUS | Status: AC
  Administered 2018-02-16: 4 mg via INTRAVENOUS
  Filled 2018-02-16: qty 1

## 2018-02-16 MED ORDER — SODIUM CHLORIDE 0.9 % IV BOLUS (SEPSIS)
1000.0000 mL | Freq: Once | INTRAVENOUS | Status: AC
  Administered 2018-02-16: 1000 mL via INTRAVENOUS

## 2018-02-16 NOTE — ED Provider Notes (Signed)
[facility] EMERGENCY DEPARTMENT Provider Note   CSN: [id number] Arrival date & time: 02/16/18  1402     History   Chief Complaint Chief Complaint  Patient presents with  . Abdominal Pain    HPI Daniel Howard is a 62 y.o. male.  62 yo M with a chief complaint of abdominal pain.  Going on for the past couple days.  Describes this as diffuse across the abdomen.  Seems to come and go.  Having less output in his ostomy bag.  No flatus.  History of multiple bowel obstructions in the past.  Thinks this feels similar.  No nausea or vomiting.  No fevers.   The history is provided by the patient.  Abdominal Pain   This is a new problem. The current episode started 2 days ago. The problem occurs constantly. The problem has been gradually worsening. The pain is located in the generalized abdominal region. The quality of the pain is aching, sharp and shooting. The pain is at a severity of 9/10. The pain is severe. Associated symptoms include constipation. Pertinent negatives include fever, diarrhea, vomiting, headaches, arthralgias and myalgias. Nothing aggravates the symptoms. Nothing relieves the symptoms.    Past Medical History:  Diagnosis Date  . Abnormal finding of biliary tract    MRCP shows pancreatic/biliary tract dilation. EUS 2010 confirmed dilation but no chronic pancreaitis or mass. Vascular ectasia crimpoing distal CBD.   [name] Anxiety   . Cancer ([place])   . 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 Duodenal ulcer 2010   nsaids  . Low back pain   . Peristomal hernia   . Small bowel obstruction ([place]) 10/2017; 11/21/2017  .  Spigelian hernia    bilateral    Patient Active Problem List   Diagnosis Date Noted  . Nausea with vomiting 11/10/2017  . Spigelian hernia 09/22/2017  . Abdominal pain of multiple sites 09/22/2017  . Goals of care, counseling/discussion 04/02/2017  . Prostate cancer ([place]) 04/02/2017  . Elevated PSA 03/04/2017  . Osteopenia 11/03/2016  . Pelvic mass in male 09/04/2016  . 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 Exacerbation of Crohn's disease ([place]) 09/04/2016  . Protein-calorie malnutrition, severe ([place]) 07/27/2014  . SBO (small bowel obstruction) ([place]) 07/26/2014  . Loss of weight 06/09/2014  . Crohn's disease of both small and large intestine with complication ([place]) 62/95/2841  . Boils 02/11/2013  . Ventral hernia 12/11/2009  . Anemia 12/03/2009  . Regional enteritis/Crohn's 01/25/2007    Past Surgical History:  Procedure Laterality Date  . APPENDECTOMY  1992   at time of exp laparotomy at which time he was noted to have fistulizing Crohn's rather than UC  . COLONOSCOPY [name] 08/23/2014   [id number] proctoscopy with possible fistulous opening in thebase of rectal/anal stump.    . COLOSTOMY  1992   transverse loop colostomy secondary to a stricture  . ESOPHAGOGASTRODUODENOSCOPY  05/2009   SLF: multiple antral erosions, large ulcer at ansatomosis (postsurgical changes at duodenal bulb and second portion of duodenum) BX c/x NSAIDS.  . EUS  10/04/2009   Dr. Estill Bakes dilated CBD and main pancreatic duct.  No pancreatic  . FLEXIBLE SIGMOIDOSCOPY  1988   Dr. Laural Golden- suggested  rohn's disease but the biopsies were not collaborative.  [name] FLEXIBLE SIGMOIDOSCOPY [name] 09/08/2016   Procedure: FLEXIBLE SIGMOIDOSCOPY;  Surgeon: Wonda Horner, MD;  Location: [facility] ENDOSCOPY;  Service: Gastroenterology;  Laterality: [name];  . HEMICOLOECTOMY W/ ANASTOMOSIS  1993   R- Dr.DeMason   . HERNIA REPAIR  1996   incarcerated periostial hernia with additional surgery in 1999  . IR FLUORO GUIDE PORT INSERTION  RIGHT  04/03/2017  . IR US GUIDE VASC ACCESS RIGHT  04/03/2017       Home Medications    Prior to Admission medications   Medication Sig Start Date End Date Taking? Authorizing Provider  budesonide (ENTOCORT EC) 3 MG 24 hr capsule Take 3 capsules (9 mg total) by mouth daily. 12/02/17  Yes [place], [name], MD  calcium carbonate (OS-CAL) 600 MG TABS tablet Take 1,200 mg by mouth daily with breakfast.   Yes [provider]  Ferrous Sulfate 28 MG TABS Take 1 tablet by mouth daily.   Yes [provider]  HYDROcodone-acetaminophen (NORCO/VICODIN) 5-325 MG tablet Take 1 tablet by mouth every 6 (six) hours as needed for moderate pain. 02/15/18  Yes [place], [name], MD  lidocaine-prilocaine (EMLA) cream Apply 1 application topically as needed (when accessing port).   Yes [provider]  mirtazapine (REMERON) 15 MG tablet TAKE 1 TABLET BY MOUTH AT BEDTIME Patient taking differently: TAKE 15 mg TABLET BY MOUTH AT BEDTIME 10/14/17  Yes [place], [name], MD  Multiple Vitamins-Minerals (CENTRUM SILVER ADULT 50+) TABS Take 1 tablet by mouth daily.   Yes [provider]    Family History Family History  Problem Relation Age of Onset  . Cancer Father        prostate   . Prostate cancer Father   . Colon cancer Father 42  . Hypertension Sister   . Cancer Sister   . Depression Sister   . Breast cancer Sister   . COPD Sister   . Aneurysm Brother        deceased, brain aneurysm    Social History Social History   Tobacco Use  . Smoking status: Former Smoker    Packs/day: 0.50    Years: 2.00    Pack years: 1.00    Types: Cigarettes    Last attempt to quit: 12/21/2009    Years since quitting: 8.1  . Smokeless tobacco: Never Used  . Tobacco comment: Quit abut 20 years  Substance Use Topics  . Alcohol use: No    Alcohol/week: 0.0 oz    Comment: Former drinker  . Drug use: No     Allergies   Penicillins   Review of Systems Review of Systems    Constitutional: Negative for chills and fever.  HENT: Negative for congestion and facial swelling.   Eyes: Negative for discharge and visual disturbance.  Respiratory: Negative for shortness of breath.   Cardiovascular: Negative for chest pain and palpitations.  Gastrointestinal: Positive for abdominal pain and constipation. Negative for diarrhea and vomiting.  Musculoskeletal: Negative for arthralgias and myalgias.  Skin: Negative for color change and rash.  Neurological: Negative for tremors, syncope and headaches.  Psychiatric/Behavioral: Negative for confusion and dysphoric mood.     Physical Exam Updated Vital Signs BP 112/81   Pulse (!) 104   Temp 98.4 F (36.9 C) (Oral)   Resp 17   Ht 5' 4.5" (1.638 m)   Wt 66.2 kg (146 lb)   SpO2 100%   BMI 24.67 kg/m   Physical Exam  Constitutional: He is oriented to person, place, and time. He appears well-developed.  Cachectic  HENT:  Head: Normocephalic and atraumatic.  Eyes: EOM are normal. Pupils are equal, round, and reactive to light.  Neck: Normal range of motion. Neck supple. No JVD present.  Cardiovascular: Normal rate and regular rhythm. Exam reveals no gallop and no friction rub.  No murmur heard. Pulmonary/Chest: No respiratory distress. He has no wheezes.  Abdominal: He exhibits no distension. There is tenderness (diffuse). There is no rebound and no guarding.  Musculoskeletal: Normal range of motion.  Neurological: He is alert and oriented to person, place, and time.  Skin: No rash noted. No pallor.  Psychiatric: He has a normal mood and affect. His behavior is normal.  Nursing note and vitals reviewed.    ED Treatments / Results  Labs (all labs ordered are listed, but only abnormal results are displayed) Labs Reviewed  COMPREHENSIVE METABOLIC PANEL - Abnormal; Notable for the following components:      Result Value   Chloride 98 (*)    Albumin 3.2 (*)    ALT 13 (*)    All other components within normal  limits  CBC - Abnormal; Notable for the following components:   RBC 3.68 (*)    Hemoglobin 11.5 (*)    HCT 34.9 (*)    All other components within normal limits  URINALYSIS, ROUTINE W REFLEX MICROSCOPIC - Abnormal; Notable for the following components:   Ketones, ur 20 (*)    All other components within normal limits  LIPASE, BLOOD    EKG  EKG Interpretation None       Radiology Dg Abdomen 1 View  Result Date: 02/16/2018 CLINICAL DATA:  Abdominal pain and distention for 2 days. EXAM: ABDOMEN - 1 VIEW COMPARISON:  Single-view of the abdomen 11/23/2017. CT abdomen and pelvis 11/21/2017. FINDINGS: A dilated loop of small bowel in the mid abdomen and left upper quadrant measures 5.1 cm in diameter. There is some gas and stool in the colon. Small locules of gas in the periphery of the left mid abdomen are presumably due to gas in stool but the appearance is atypical. The patient is status post prior hernia repair. Sclerotic bone lesions consistent with history of metastatic prostate carcinoma are identified as seen on the prior exams. IMPRESSION: Abnormal bowel-gas pattern highly suspicious for obstruction. Locules of gas in left mid abdomen are presumably due to gas in stool but could represent pneumatosis. CT abdomen and pelvis with oral and IV contrast is recommended for further evaluation. Electronically Signed   By: Inge Rise M.D.   On: 02/16/2018 17:31   Ct Abdomen Pelvis W Contrast  Result Date: 02/16/2018 CLINICAL DATA:  62 y/o M; central abdominal pain with distention for the last 2 days. Right-sided colostomy bag. History of Crohn's disease. EXAM: CT ABDOMEN AND PELVIS WITH CONTRAST TECHNIQUE: Multidetector CT imaging of the abdomen and pelvis was performed using the standard protocol following bolus administration of intravenous contrast. CONTRAST:  173m ISOVUE-300 IOPAMIDOL (ISOVUE-300) INJECTION 61% COMPARISON:  11/21/2017 CT abdomen and pelvis. FINDINGS: Lower chest: No  acute abnormality. Hepatobiliary: No focal liver abnormality is seen. No gallstones, gallbladder wall thickening, or biliary dilatation. Pancreas: No inflammatory change or mass lesion identified. Stable mild dilatation of the main pancreatic duct. Spleen: Normal in size without focal abnormality. Adrenals/Urinary Tract: Adrenal glands are unremarkable. Kidneys are normal, without renal calculi, focal lesion, or hydronephrosis. Layering dependent densities within the bladder, probably small stones. Mild bladder wall thickening.  Stomach/Bowel: Diffuse dilatation of small bowel throughout the abdomen to the level of distal small bowel approximately 6 cm upstream to the right lower quadrant enterostomy where there is abrupt change in caliber and fecalization of stool contents probably representing chronic underlying obstruction from stricture or adhesion (series 3, image 53). Mild wall thickening of small bowel in the right hemiabdomen, surrounding edema in mesenteric fat, and small volume of ascites. Vascular/Lymphatic: Aortic atherosclerosis. Multiple enlarged lymph nodes are present in the right lower quadrant anterior to psoas muscle measuring up to 14 x 13 mm (series 3, image 45), stable from prior study. Reproductive: Grossly stable infiltrative mass associated with the prostate gland with extension into the right lateral pelvic wall, right laboratory findings, right seminal vesicle, and with loss of intervening rectal fat plane possibly representing rectal involvement measuring up to 6.6 x 4.5 cm in axial plane (series 3, image 73). Other: Large left lateral abdominal wall spigelian hernia with neck measuring 3.7 x 3.2 cm (ML by CC) is grossly stable. No edema of bowel loops within the herniation sac. Tiny right upper quadrant abdominal hernia containing side loop of bowel (series 3, image 43). Right lower quadrant ileostomy. Musculoskeletal: Multiple sclerotic pelvic masses in the pelvis are grossly stable.  IMPRESSION: 1. Diffuse dilatation of small bowel throughout the abdomen to the level of distal small bowel approximately 6 cm upstream to the right lower quadrant enterostomy where there is abrupt change in caliber and fecalization of stool contents probably representing chronic underlying obstruction from stricture or adhesion. 2. Mild bowel wall thickening with mesenteric edema and small volume of ascites probably represents underlying infectious or inflammatory enteritis. 3. Large left-sided spigelian hernia containing bowel without inflammatory change. 4. Small right upper quadrant upper abdominal wall hernia containing side loop of small bowel. 5. Grossly stable prostate mass with local invasion as above. Multiple sclerotic pelvic metastasis are also stable. 6. Stable lymphadenopathy in right lower quadrant. 7. Stable mild pancreatic ductal dilatation. No downstream mass or lesion identified. Electronically Signed   By: Kristine Garbe M.D.   On: 02/16/2018 23:47    Procedures Procedures (including critical care time)  Medications Ordered in ED Medications  dextrose 5 %-0.45 % sodium chloride infusion (not administered)  morphine 4 MG/ML injection 4 mg (4 mg Intravenous Given 02/16/18 2100)  ondansetron (ZOFRAN) injection 4 mg (4 mg Intravenous Given 02/16/18 2100)  sodium chloride 0.9 % bolus 1,000 mL (0 mLs Intravenous Stopped 02/16/18 2231)  iopamidol (ISOVUE-300) 61 % injection (100 mLs  Contrast Given 02/16/18 2100)     Initial Impression / Assessment and Plan / ED Course  I have reviewed the triage vital signs and the nursing notes.  Pertinent labs & imaging results that were available during my care of the patient were reviewed by me and considered in my medical decision making (see chart for details).     62 yo M with a chief complaint of diffuse abdominal pain.  Patient has a history of multiple small bowel obstructions and thinks this feels similar.  Patient was seen in the  quick look process.  Had labs performed that are unremarkable.  KUB with concern for possible obstruction.  Will obtain a CT.  CT concerning for possible obstruction. Discussed with Dr Kieth Brightly, gen surgery.  Recommended admit.  Coming to eval patient.   The patients results and plan were reviewed and discussed.   Any x-rays performed were independently reviewed by myself.   Differential diagnosis were considered with the presenting HPI.  Medications  dextrose 5 %-0.45 % sodium chloride infusion (not administered)  morphine 4 MG/ML injection 4 mg (4 mg Intravenous Given 02/16/18 2100)  ondansetron (ZOFRAN) injection 4 mg (4 mg Intravenous Given 02/16/18 2100)  sodium chloride 0.9 % bolus 1,000 mL (0 mLs Intravenous Stopped 02/16/18 2231)  iopamidol (ISOVUE-300) 61 % injection (100 mLs  Contrast Given 02/16/18 2100)    Vitals:   02/16/18 2025 02/16/18 2028 02/16/18 2030 02/17/18 0004  BP:  122/69 138/87 112/81  Pulse:  90 91 (!) 104  Resp:  17  17  Temp:      TempSrc:      SpO2:  98% 99% 100%  Weight: 66.2 kg (146 lb)     Height: 5' 4.5" (1.638 m)       Final diagnoses:  SBO (small bowel obstruction) ([place])    Admission/ observation were discussed with the admitting physician, patient and/or family and they are comfortable with the plan.    Final Clinical Impressions(s) / ED Diagnoses   Final diagnoses:  SBO (small bowel obstruction) [facility])    ED Discharge Orders    None       Deno Etienne, DO 02/17/18 0012

## 2018-02-16 NOTE — ED Triage Notes (Signed)
Pt reports for the past 2 days he has abd pain with distension and feels unable to have normal BM, HX of bowel obstruction. Pt denies any n/v.

## 2018-02-17 ENCOUNTER — Encounter (HOSPITAL_COMMUNITY): Payer: Self-pay | Admitting: Internal Medicine

## 2018-02-17 DIAGNOSIS — K56600 Partial intestinal obstruction, unspecified as to cause: Secondary | ICD-10-CM | POA: Diagnosis not present

## 2018-02-17 DIAGNOSIS — K56609 Unspecified intestinal obstruction, unspecified as to partial versus complete obstruction: Secondary | ICD-10-CM

## 2018-02-17 LAB — CBC
HEMATOCRIT: 31.3 % — AB (ref 39.0–52.0)
HEMOGLOBIN: 10.5 g/dL — AB (ref 13.0–17.0)
MCH: 31.5 pg (ref 26.0–34.0)
MCHC: 33.5 g/dL (ref 30.0–36.0)
MCV: 94 fL (ref 78.0–100.0)
Platelets: 282 10*3/uL (ref 150–400)
RBC: 3.33 MIL/uL — ABNORMAL LOW (ref 4.22–5.81)
RDW: 12.6 % (ref 11.5–15.5)
WBC: 5.6 10*3/uL (ref 4.0–10.5)

## 2018-02-17 LAB — BASIC METABOLIC PANEL
ANION GAP: 12 (ref 5–15)
BUN: 14 mg/dL (ref 6–20)
CALCIUM: 8.6 mg/dL — AB (ref 8.9–10.3)
CO2: 20 mmol/L — ABNORMAL LOW (ref 22–32)
Chloride: 102 mmol/L (ref 101–111)
Creatinine, Ser: 1.2 mg/dL (ref 0.61–1.24)
GFR calc Af Amer: >60 mL/min (ref >60)
GFR calc non Af Amer: >60 mL/min (ref >60)
Glucose, Bld: 100 mg/dL — ABNORMAL HIGH (ref 65–99)
Potassium: 4.1 mmol/L (ref 3.5–5.1)
Sodium: 134 mmol/L — ABNORMAL LOW (ref 135–145)

## 2018-02-17 LAB — CBG MONITORING, ED: Glucose-Capillary: 122 mg/dL — ABNORMAL HIGH (ref 65–99)

## 2018-02-17 MED ORDER — MORPHINE SULFATE (PF) 4 MG/ML IV SOLN
2.0000 mg | INTRAVENOUS | Status: DC | PRN
  Administered 2018-02-17 – 2018-02-18 (×2): 4 mg via INTRAVENOUS
  Filled 2018-02-17 (×2): qty 1

## 2018-02-17 MED ORDER — ACETAMINOPHEN 325 MG PO TABS: 650.0000 mg | ORAL_TABLET | Freq: Four times a day (QID) | ORAL | Status: DC | PRN

## 2018-02-17 MED ORDER — ONDANSETRON HCL 4 MG/2ML IJ SOLN
4.0000 mg | Freq: Four times a day (QID) | INTRAMUSCULAR | Status: DC | PRN
Start: 2018-02-17 — End: 2018-02-18

## 2018-02-17 MED ORDER — DEXTROSE-NACL 5-0.9 % IV SOLN
INTRAVENOUS | Status: AC
  Administered 2018-02-17: 02:00:00 via INTRAVENOUS

## 2018-02-17 MED ORDER — ACETAMINOPHEN 650 MG RE SUPP: 650.0000 mg | Freq: Four times a day (QID) | RECTAL | Status: DC | PRN

## 2018-02-17 MED ORDER — BUDESONIDE 3 MG PO CPEP
9.0000 mg | ORAL_CAPSULE | Freq: Every day | ORAL | Status: DC
  Administered 2018-02-18: 9 mg via ORAL
  Filled 2018-02-17 (×2): qty 3

## 2018-02-17 MED ORDER — SODIUM CHLORIDE 0.9% FLUSH: 10.0000 mL | INTRAVENOUS | Status: DC | PRN

## 2018-02-17 MED ORDER — SODIUM CHLORIDE 0.9% FLUSH: 10.0000 mL | Freq: Two times a day (BID) | INTRAVENOUS | Status: DC

## 2018-02-17 MED ORDER — DEXTROSE-NACL 5-0.45 % IV SOLN
INTRAVENOUS | Status: DC
  Administered 2018-02-17: 01:00:00 via INTRAVENOUS

## 2018-02-17 MED ORDER — LIDOCAINE 4 % EX CREA
TOPICAL_CREAM | Freq: Once | CUTANEOUS | Status: AC
  Administered 2018-02-17: 1 via TOPICAL
  Filled 2018-02-17: qty 5

## 2018-02-17 MED ORDER — ONDANSETRON HCL 4 MG PO TABS: 4.0000 mg | ORAL_TABLET | Freq: Four times a day (QID) | ORAL | Status: DC | PRN

## 2018-02-17 NOTE — ED Notes (Signed)
Patient reports that he is having good output from ostomy. Reports emptying 3-4 times today.

## 2018-02-17 NOTE — Care Management Note (Signed)
Case Management Note  Patient Details  Name: Daniel Howard MRN: [id number] Date of Birth: [birth year]  Subjective/Objective:                   62 y.o. male with history of Crohn's disease and metastatic prostate cancer presents to the ER because of increasing abdominal pain. From home with spouse.  Action/Plan: Admit status INPATIENT (BOWEL OBSTRUCTION); anticipate discharge [place].   Expected Discharge Date:  (unknown)               Expected Discharge Plan:  [place]  In-House Referral:     Discharge planning Services  CM Consult  Status of Service:  In process, will continue to follow  If discussed at Long Length of Stay Meetings, dates discussed:    Additional Comments:  Fuller Mandril, RN 02/17/2018, 11:40 AM

## 2018-02-17 NOTE — ED Notes (Signed)
Lunch Tray Ordered

## 2018-02-17 NOTE — Care Management (Signed)
[facility] referral placed patient may benefit from [facility] in addition to other transitional care service.

## 2018-02-17 NOTE — Consult Note (Signed)
Reason for Consult: abdominal pain Referring Physician:  [name] is an 62 y.o. male.  HPI: 62 yo male with long history of Crohn's disease with multiple surgeries. He has an colostomy and began having worsening pain this week. The pain is midabdominal pain and intermittent. It is not related to food. Prior to coming in he had no drainage from his ostomy for 24h. He had some nasuea but no vomiting.  Past Medical History:  Diagnosis Date  . Abnormal finding of biliary tract    MRCP shows pancreatic/biliary tract dilation. EUS 2010 confirmed dilation but no chronic pancreaitis or mass. Vascular ectasia crimpoing distal CBD.   [name] Anxiety   . Cancer ([place])   . 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 Duodenal ulcer 2010   nsaids  . Low back pain   . Peristomal hernia   . Small bowel obstruction ([place]) 10/2017; 11/21/2017  . Spigelian hernia    bilateral    Past Surgical History:  Procedure Laterality Date  . APPENDECTOMY  1992   at time of exp laparotomy at which time he was noted to have fistulizing Crohn's rather than UC  . COLONOSCOPY [name] 08/23/2014   INO:[facility]VEHMCN proctoscopy with possible fistulous opening in thebase of rectal/anal stump.    . COLOSTOMY  1992   transverse loop colostomy secondary to a stricture  . ESOPHAGOGASTRODUODENOSCOPY  05/2009   SLF: multiple antral erosions, large ulcer at ansatomosis (postsurgical changes at duodenal bulb and second portion of duodenum) BX c/x NSAIDS.  . EUS  10/04/2009   Dr. Estill Bakes dilated CBD and main pancreatic duct.  No pancreatic  . FLEXIBLE SIGMOIDOSCOPY  1988   Dr. Laural Golden-  suggested rohn's disease but the biopsies were not collaborative.  [name] FLEXIBLE SIGMOIDOSCOPY [name] 09/08/2016   Procedure: FLEXIBLE SIGMOIDOSCOPY;  Surgeon: Wonda Horner, MD;  Location: [facility] ENDOSCOPY;  Service: Gastroenterology;  Laterality: [name];  . HEMICOLOECTOMY W/ ANASTOMOSIS  1993   R- Dr.DeMason   . HERNIA REPAIR  1996   incarcerated periostial hernia with additional surgery in 1999  . IR FLUORO GUIDE PORT INSERTION RIGHT  04/03/2017  . IR US GUIDE VASC ACCESS RIGHT  04/03/2017    Family History  Problem Relation Age of Onset  . Cancer Father        prostate   . Prostate cancer Father   . Colon cancer Father 52  . Hypertension Sister   . Cancer Sister   . Depression Sister   . Breast cancer Sister   . COPD Sister   . Aneurysm Brother        deceased, brain aneurysm    Social History:  reports that he quit smoking about 8 years ago. His smoking use included cigarettes. He has a 1.00 pack-year smoking history. he has never used smokeless tobacco. He reports that he does not drink alcohol or use drugs.  Allergies:  Allergies  Allergen Reactions  . Penicillins Hives    Has patient had a PCN reaction causing immediate rash, facial/tongue/throat swelling, SOB or lightheadedness with hypotension: Yes Has patient had a PCN reaction causing severe rash involving mucus membranes or skin necrosis: No Has patient had a PCN reaction that  required hospitalization: No Has patient had a PCN reaction occurring within the last 10 years: No If all of the above answers are "NO", then may proceed with Cephalosporin use.     Medications: I have reviewed the patient's current medications.  Results for orders placed or performed during the hospital encounter of 02/16/18 (from the past 48 hour(s))  Lipase, blood     Status: None   Collection Time: 02/16/18  3:23 PM  Result Value Ref Range   Lipase 19 11 - 51 U/L    Comment: Performed at [facility], [place] [address] [place], [place]  [address]  Comprehensive metabolic panel     Status: Abnormal   Collection Time: 02/16/18  3:23 PM  Result Value Ref Range   Sodium 135 135 - 145 mmol/L   Potassium 4.0 3.5 - 5.1 mmol/L   Chloride 98 (L) 101 - 111 mmol/L   CO2 24 22 - 32 mmol/L   Glucose, Bld 81 65 - 99 mg/dL   BUN 14 6 - 20 mg/dL   Creatinine, Ser 1.19 0.61 - 1.24 mg/dL   Calcium 9.0 8.9 - 10.3 mg/dL   Total Protein 7.8 6.5 - 8.1 g/dL   Albumin 3.2 (L) 3.5 - 5.0 g/dL   AST 23 15 - 41 U/L   ALT 13 (L) 17 - 63 U/L   Alkaline Phosphatase 60 38 - 126 U/L   Total Bilirubin 1.2 0.3 - 1.2 mg/dL   GFR calc non Af Amer >60 >60 mL/min   GFR calc Af Amer >60 >60 mL/min    Comment: (NOTE) The eGFR has been calculated using the CKD EPI equation. This calculation has not been validated in all clinical situations. eGFR's persistently <60 mL/min signify possible Chronic Kidney Disease.    Anion gap 13 5 - 15    Comment: Performed at [facility] [address], [place], [place] [address]  CBC     Status: Abnormal   Collection Time: 02/16/18  3:23 PM  Result Value Ref Range   WBC 9.2 4.0 - 10.5 K/uL   RBC 3.68 (L) 4.22 - 5.81 MIL/uL   Hemoglobin 11.5 (L) 13.0 - 17.0 g/dL   HCT 34.9 (L) 39.0 - 52.0 %   MCV 94.8 78.0 - 100.0 fL   MCH 31.3 26.0 - 34.0 pg   MCHC 33.0 30.0 - 36.0 g/dL   RDW 12.6 11.5 - 15.5 %   Platelets 317 150 - 400 K/uL    Comment: Performed at [facility], [place] [address] [place], [place] [address]  Urinalysis, Routine w reflex microscopic     Status: Abnormal   Collection Time: 02/16/18  7:35 PM  Result Value Ref Range   Color, Urine YELLOW YELLOW   APPearance CLEAR CLEAR   Specific Gravity, Urine 1.013 1.005 - 1.030   pH 5.0 5.0 - 8.0   Glucose, UA NEGATIVE NEGATIVE mg/dL   Hgb urine dipstick NEGATIVE NEGATIVE   Bilirubin Urine NEGATIVE NEGATIVE   Ketones, ur 20 (A) NEGATIVE mg/dL   Protein, ur NEGATIVE NEGATIVE mg/dL   Nitrite NEGATIVE NEGATIVE   Leukocytes, UA NEGATIVE NEGATIVE     Comment: Performed at [facility] [address] [place], [place] [address]    Dg Abdomen 1 View  Result Date: 02/16/2018 CLINICAL DATA:  Abdominal pain and distention for 2 days. EXAM: ABDOMEN - 1 VIEW COMPARISON:  Single-view of the abdomen 11/23/2017. CT abdomen and pelvis 11/21/2017. FINDINGS: A dilated  loop of small bowel in the mid abdomen and left upper quadrant measures 5.1 cm in diameter. There is some gas and stool in the colon. Small locules of gas in the periphery of the left mid abdomen are presumably due to gas in stool but the appearance is atypical. The patient is status post prior hernia repair. Sclerotic bone lesions consistent with history of metastatic prostate carcinoma are identified as seen on the prior exams. IMPRESSION: Abnormal bowel-gas pattern highly suspicious for obstruction. Locules of gas in left mid abdomen are presumably due to gas in stool but could represent pneumatosis. CT abdomen and pelvis with oral and IV contrast is recommended for further evaluation. Electronically Signed   By: Inge Rise M.D.   On: 02/16/2018 17:31   Ct Abdomen Pelvis W Contrast  Result Date: 02/16/2018 CLINICAL DATA:  62 y/o M; central abdominal pain with distention for the last 2 days. Right-sided colostomy bag. History of Crohn's disease. EXAM: CT ABDOMEN AND PELVIS WITH CONTRAST TECHNIQUE: Multidetector CT imaging of the abdomen and pelvis was performed using the standard protocol following bolus administration of intravenous contrast. CONTRAST:  155m ISOVUE-300 IOPAMIDOL (ISOVUE-300) INJECTION 61% COMPARISON:  11/21/2017 CT abdomen and pelvis. FINDINGS: Lower chest: No acute abnormality. Hepatobiliary: No focal liver abnormality is seen. No gallstones, gallbladder wall thickening, or biliary dilatation. Pancreas: No inflammatory change or mass lesion identified. Stable mild dilatation of the main pancreatic duct. Spleen: Normal in size without focal abnormality.  Adrenals/Urinary Tract: Adrenal glands are unremarkable. Kidneys are normal, without renal calculi, focal lesion, or hydronephrosis. Layering dependent densities within the bladder, probably small stones. Mild bladder wall thickening. Stomach/Bowel: Diffuse dilatation of small bowel throughout the abdomen to the level of distal small bowel approximately 6 cm upstream to the right lower quadrant enterostomy where there is abrupt change in caliber and fecalization of stool contents probably representing chronic underlying obstruction from stricture or adhesion (series 3, image 53). Mild wall thickening of small bowel in the right hemiabdomen, surrounding edema in mesenteric fat, and small volume of ascites. Vascular/Lymphatic: Aortic atherosclerosis. Multiple enlarged lymph nodes are present in the right lower quadrant anterior to psoas muscle measuring up to 14 x 13 mm (series 3, image 45), stable from prior study. Reproductive: Grossly stable infiltrative mass associated with the prostate gland with extension into the right lateral pelvic wall, right laboratory findings, right seminal vesicle, and with loss of intervening rectal fat plane possibly representing rectal involvement measuring up to 6.6 x 4.5 cm in axial plane (series 3, image 73). Other: Large left lateral abdominal wall spigelian hernia with neck measuring 3.7 x 3.2 cm (ML by CC) is grossly stable. No edema of bowel loops within the herniation sac. Tiny right upper quadrant abdominal hernia containing side loop of bowel (series 3, image 43). Right lower quadrant ileostomy. Musculoskeletal: Multiple sclerotic pelvic masses in the pelvis are grossly stable. IMPRESSION: 1. Diffuse dilatation of small bowel throughout the abdomen to the level of distal small bowel approximately 6 cm upstream to the right lower quadrant enterostomy where there is abrupt change in caliber and fecalization of stool contents probably representing chronic underlying obstruction  from stricture or adhesion. 2. Mild bowel wall thickening with mesenteric edema and small volume of ascites probably represents underlying infectious or inflammatory enteritis. 3. Large left-sided spigelian hernia containing bowel without inflammatory change. 4. Small right upper quadrant upper abdominal wall hernia containing side loop of small bowel. 5. Grossly stable prostate mass with local invasion as above. Multiple  sclerotic pelvic metastasis are also stable. 6. Stable lymphadenopathy in right lower quadrant. 7. Stable mild pancreatic ductal dilatation. No downstream mass or lesion identified. Electronically Signed   By: Kristine Garbe M.D.   On: 02/16/2018 23:47    Review of Systems  Constitutional: Negative for chills and fever.  HENT: Negative for hearing loss.   Eyes: Negative for blurred vision and double vision.  Respiratory: Negative for cough and hemoptysis.   Cardiovascular: Negative for chest pain and palpitations.  Gastrointestinal: Positive for abdominal pain, constipation and nausea. Negative for vomiting.  Genitourinary: Negative for dysuria and urgency.  Musculoskeletal: Negative for myalgias and neck pain.  Skin: Negative for itching and rash.  Neurological: Negative for dizziness, tingling and headaches.  Endo/Heme/Allergies: Does not bruise/bleed easily.  Psychiatric/Behavioral: Negative for depression and suicidal ideas.   Blood pressure 112/73, pulse 83, temperature 98.4 F (36.9 C), temperature source Oral, resp. rate 14, height 5' 4.5" (1.638 m), weight 66.2 kg (146 lb), SpO2 97 %. Physical Exam  Vitals reviewed. Constitutional: He is oriented to person, place, and time. He appears well-developed and well-nourished.  HENT:  Head: Normocephalic and atraumatic.  Eyes: Conjunctivae and EOM are normal. Pupils are equal, round, and reactive to light.  Neck: Normal range of motion. Neck supple.  Cardiovascular: Normal rate and regular rhythm.  Respiratory:  Effort normal and breath sounds normal.  GI: Soft. Bowel sounds are normal. He exhibits no distension. There is tenderness.  Right sided colostomy with 4cm protrusion beyond skin level. Left sided hernia, able to partially reduce with sound of succous moving through the intestine.  Musculoskeletal: Normal range of motion.  Neurological: He is alert and oriented to person, place, and time.  Skin: Skin is warm and dry.  Psychiatric: He has a normal mood and affect. His behavior is normal.    Assessment/Plan: 62 yo male with abdominal pain and evidence of bowel obstruction. He has now had ostomy output and has a large left sided hernia from previous ostomy location which was partially reduced. I think this hernia is chronic but on imaging it was an area of caliber change and may be the reason for the partial SBO as well as pain. We will reevaluate and discuss potential repair with him further. As he has had multiple surgeries scar tissue will likely be an issue so no procedures will be rushed. -continue clear liquids  Daniel Howard 02/17/2018, 3:05 AM

## 2018-02-17 NOTE — Progress Notes (Signed)
Subjective/Chief Complaint: Feels fine this AM. No pain.    Objective: Vital signs in last 24 hours: Temp:  [98.1 F (36.7 C)-98.4 F (36.9 C)] 98.4 F (36.9 C) (02/26 1937) Pulse Rate:  [73-104] 78 (02/27 0615) Resp:  [14-18] 15 (02/27 0319) BP: (97-143)/(63-87) 109/70 (02/27 0615) SpO2:  [96 %-100 %] 97 % (02/27 0615) Weight:  [66.2 kg (146 lb)] 66.2 kg (146 lb) (02/26 2025)    Intake/Output from previous day: 02/26 0701 - 02/27 0700 In: 1100 [I.V.:100; IV Piggyback:1000] Out: -  Intake/Output this shift: No intake/output data recorded.  General appearance: alert and cooperative Resp: clear to auscultation bilaterally Cardio: regular rate and rhythm GI: soft, nontender, nondistended. Pink and patent stoma RLQ with stool and gas in bag. Midline scar with central bud of granulation tissue which he states will spit suture material from time to time. Large partially reducible LLQ hernia nontender Extremities: extremities normal, atraumatic, no cyanosis or edema Pulses: 2+ and symmetric Neurologic: Grossly normal Incision/Wound: suture granuloma midline, multiple healed incisions  Lab Results:  Recent Labs    02/16/18 1523 02/17/18 0323  WBC 9.2 5.6  HGB 11.5* 10.5*  HCT 34.9* 31.3*  PLT 317 282   BMET Recent Labs    02/16/18 1523 02/17/18 0323  NA 135 134*  K 4.0 4.1  CL 98* 102  CO2 24 20*  GLUCOSE 81 100*  BUN 14 14  CREATININE 1.19 1.20  CALCIUM 9.0 8.6*   PT/INR No results for input(s): LABPROT, INR in the last 72 hours. ABG No results for input(s): PHART, HCO3 in the last 72 hours.  Invalid input(s): PCO2, PO2  Studies/Results: Dg Abdomen 1 View  Result Date: 02/16/2018 CLINICAL DATA:  Abdominal pain and distention for 2 days. EXAM: ABDOMEN - 1 VIEW COMPARISON:  Single-view of the abdomen 11/23/2017. CT abdomen and pelvis 11/21/2017. FINDINGS: A dilated loop of small bowel in the mid abdomen and left upper quadrant measures 5.1 cm in  diameter. There is some gas and stool in the colon. Small locules of gas in the periphery of the left mid abdomen are presumably due to gas in stool but the appearance is atypical. The patient is status post prior hernia repair. Sclerotic bone lesions consistent with history of metastatic prostate carcinoma are identified as seen on the prior exams. IMPRESSION: Abnormal bowel-gas pattern highly suspicious for obstruction. Locules of gas in left mid abdomen are presumably due to gas in stool but could represent pneumatosis. CT abdomen and pelvis with oral and IV contrast is recommended for further evaluation. Electronically Signed   By: Inge Rise M.D.   On: 02/16/2018 17:31   Ct Abdomen Pelvis W Contrast  Result Date: 02/16/2018 CLINICAL DATA:  62 y/o M; central abdominal pain with distention for the last 2 days. Right-sided colostomy bag. History of Crohn's disease. EXAM: CT ABDOMEN AND PELVIS WITH CONTRAST TECHNIQUE: Multidetector CT imaging of the abdomen and pelvis was performed using the standard protocol following bolus administration of intravenous contrast. CONTRAST:  174m ISOVUE-300 IOPAMIDOL (ISOVUE-300) INJECTION 61% COMPARISON:  11/21/2017 CT abdomen and pelvis. FINDINGS: Lower chest: No acute abnormality. Hepatobiliary: No focal liver abnormality is seen. No gallstones, gallbladder wall thickening, or biliary dilatation. Pancreas: No inflammatory change or mass lesion identified. Stable mild dilatation of the main pancreatic duct. Spleen: Normal in size without focal abnormality. Adrenals/Urinary Tract: Adrenal glands are unremarkable. Kidneys are normal, without renal calculi, focal lesion, or hydronephrosis. Layering dependent densities within the bladder, probably small stones. Mild  bladder wall thickening. Stomach/Bowel: Diffuse dilatation of small bowel throughout the abdomen to the level of distal small bowel approximately 6 cm upstream to the right lower quadrant enterostomy where  there is abrupt change in caliber and fecalization of stool contents probably representing chronic underlying obstruction from stricture or adhesion (series 3, image 53). Mild wall thickening of small bowel in the right hemiabdomen, surrounding edema in mesenteric fat, and small volume of ascites. Vascular/Lymphatic: Aortic atherosclerosis. Multiple enlarged lymph nodes are present in the right lower quadrant anterior to psoas muscle measuring up to 14 x 13 mm (series 3, image 45), stable from prior study. Reproductive: Grossly stable infiltrative mass associated with the prostate gland with extension into the right lateral pelvic wall, right laboratory findings, right seminal vesicle, and with loss of intervening rectal fat plane possibly representing rectal involvement measuring up to 6.6 x 4.5 cm in axial plane (series 3, image 73). Other: Large left lateral abdominal wall spigelian hernia with neck measuring 3.7 x 3.2 cm (ML by CC) is grossly stable. No edema of bowel loops within the herniation sac. Tiny right upper quadrant abdominal hernia containing side loop of bowel (series 3, image 43). Right lower quadrant ileostomy. Musculoskeletal: Multiple sclerotic pelvic masses in the pelvis are grossly stable. IMPRESSION: 1. Diffuse dilatation of small bowel throughout the abdomen to the level of distal small bowel approximately 6 cm upstream to the right lower quadrant enterostomy where there is abrupt change in caliber and fecalization of stool contents probably representing chronic underlying obstruction from stricture or adhesion. 2. Mild bowel wall thickening with mesenteric edema and small volume of ascites probably represents underlying infectious or inflammatory enteritis. 3. Large left-sided spigelian hernia containing bowel without inflammatory change. 4. Small right upper quadrant upper abdominal wall hernia containing side loop of small bowel. 5. Grossly stable prostate mass with local invasion as  above. Multiple sclerotic pelvic metastasis are also stable. 6. Stable lymphadenopathy in right lower quadrant. 7. Stable mild pancreatic ductal dilatation. No downstream mass or lesion identified. Electronically Signed   By: Kristine Garbe M.D.   On: 02/16/2018 23:47    Anti-infectives: Anti-infectives (From admission, onward)   None      Assessment/Plan: s/p * No surgery found * Advance diet. On review of the CT the obstruction seems to be a chronic area just proximal to the ostomy, rather than from the hernia. Will continue to follow. Patient emphasizes that his surgeon in [place] has told him he should not have any more surgery  LOS: 0 days    Clovis Riley 02/17/2018

## 2018-02-17 NOTE — Progress Notes (Signed)
Patient admitted to 6n1, alert and oriented, reports mild pain but nothing moderate in abdomen. VSS, IV fluids infusing. Patient has ostomy that he manages himself. Belongings at the bedside, oriented to room and staff, will continue to monitor.

## 2018-02-17 NOTE — Progress Notes (Addendum)
Patient seen and examined   He has had increased output into his ostomy  No nausea /vomitting Would like to eat Will advance diet to soft today Anticipate dc in am , requesting dc in am

## 2018-02-17 NOTE — H&P (Addendum)
History and Physical    DALESSANDRO BALDYGA CXK:[id number] DOB: 1956-02-15 DOA: 02/16/2018  PCP: Alycia Rossetti, MD  Patient coming from: Home.  Chief Complaint: Abdominal pain.  HPI: Daniel Howard is a 62 y.o. male with history of Crohn's disease and metastatic prostate cancer presents to the ER because of increasing abdominal pain.  Patient has been having pain around the right lower quadrant around the ileostomy area.  Pain was constant with no nausea vomiting.  Has been taking his medications.  ED Course: In the ER CT scan shows features concerning for small bowel obstruction and hernia and on-call general surgeon was consulted and patient was admitted for further management.  Review of Systems: As per HPI, rest all negative.   Past Medical History:  Diagnosis Date  . Abnormal finding of biliary tract    MRCP shows pancreatic/biliary tract dilation. EUS 2010 confirmed dilation but no chronic pancreaitis or mass. Vascular ectasia crimpoing distal CBD.   [name] Anxiety   . Cancer ([place])   . 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 Duodenal ulcer 2010   nsaids  . Low back pain   . Peristomal hernia   . Small bowel obstruction ([place]) 10/2017; 11/21/2017  . Spigelian hernia    bilateral    Past Surgical History:  Procedure Laterality Date  . APPENDECTOMY  1992   at time of exp laparotomy at which time he was noted to have fistulizing Crohn's rather than UC  . COLONOSCOPY [name] 08/23/2014   HFW:YOVZCHY proctoscopy with possible fistulous opening in thebase of rectal/anal stump.    . COLOSTOMY  1992   transverse loop colostomy secondary to a  stricture  . ESOPHAGOGASTRODUODENOSCOPY  05/2009   SLF: multiple antral erosions, large ulcer at ansatomosis (postsurgical changes at duodenal bulb and second portion of duodenum) BX c/x NSAIDS.  . EUS  10/04/2009   Dr. Estill Bakes dilated CBD and main pancreatic duct.  No pancreatic  . FLEXIBLE SIGMOIDOSCOPY  1988   Dr. Laural Golden- suggested rohn's disease but the biopsies were not collaborative.  [name] FLEXIBLE SIGMOIDOSCOPY [name] 09/08/2016   Procedure: FLEXIBLE SIGMOIDOSCOPY;  Surgeon: Wonda Horner, MD;  Location: [facility] ENDOSCOPY;  Service: Gastroenterology;  Laterality: [name];  . HEMICOLOECTOMY W/ ANASTOMOSIS  1993   R- Dr.DeMason   . HERNIA REPAIR  1996   incarcerated periostial hernia with additional surgery in 1999  . IR FLUORO GUIDE PORT INSERTION RIGHT  04/03/2017  . IR US GUIDE VASC ACCESS RIGHT  04/03/2017     reports that he quit smoking about 8 years ago. His smoking use included cigarettes. He has a 1.00 pack-year smoking history. he has never used smokeless tobacco. He reports that he does not drink alcohol or use drugs.  Allergies  Allergen Reactions  . Penicillins Hives    Has patient had a PCN reaction causing immediate rash, facial/tongue/throat swelling, SOB or lightheadedness with hypotension: Yes Has patient had a PCN reaction causing severe rash involving mucus membranes or skin necrosis: No Has patient had a PCN reaction that required hospitalization: No Has patient had a PCN reaction occurring within the last 10 years: No If all of the above answers are "NO", then may proceed  with Cephalosporin use.     Family History  Problem Relation Age of Onset  . Cancer Father        prostate   . Prostate cancer Father   . Colon cancer Father 28  . Hypertension Sister   . Cancer Sister   . Depression Sister   . Breast cancer Sister   . COPD Sister   . Aneurysm Brother        deceased, brain aneurysm    Prior to Admission medications   Medication Sig Start Date End Date  Taking? Authorizing Provider  budesonide (ENTOCORT EC) 3 MG 24 hr capsule Take 3 capsules (9 mg total) by mouth daily. 12/02/17  Yes [place], [name], MD  calcium carbonate (OS-CAL) 600 MG TABS tablet Take 1,200 mg by mouth daily with breakfast.   Yes [provider]  Ferrous Sulfate 28 MG TABS Take 1 tablet by mouth daily.   Yes [provider]  HYDROcodone-acetaminophen (NORCO/VICODIN) 5-325 MG tablet Take 1 tablet by mouth every 6 (six) hours as needed for moderate pain. 02/15/18  Yes [place], [name], MD  lidocaine-prilocaine (EMLA) cream Apply 1 application topically as needed (when accessing port).   Yes [provider]  mirtazapine (REMERON) 15 MG tablet TAKE 1 TABLET BY MOUTH AT BEDTIME Patient taking differently: TAKE 15 mg TABLET BY MOUTH AT BEDTIME 10/14/17  Yes [place], [name], MD  Multiple Vitamins-Minerals (CENTRUM SILVER ADULT 50+) TABS Take 1 tablet by mouth daily.   Yes [provider]    Physical Exam: Vitals:   02/16/18 2028 02/16/18 2030 02/17/18 0004 02/17/18 0056  BP: 122/69 138/87 112/81 109/63  Pulse: 90 91 (!) 104 86  Resp: 17  17 14   Temp:      TempSrc:      SpO2: 98% 99% 100% 99%  Weight:      Height:          Constitutional: Moderately built and nourished. Vitals:   02/16/18 2028 02/16/18 2030 02/17/18 0004 02/17/18 0056  BP: 122/69 138/87 112/81 109/63  Pulse: 90 91 (!) 104 86  Resp: 17  17 14   Temp:      TempSrc:      SpO2: 98% 99% 100% 99%  Weight:      Height:       Eyes: Anicteric no pallor. ENMT: No discharge from the ears eyes nose or mouth. Neck: No mass palpated no neck rigidity. Respiratory: No rhonchi or crepitations. Cardiovascular: S1-S2 heard no murmurs appreciated. Abdomen: Soft nontender ileostomy bag is full. Musculoskeletal: No edema.  No joint effusion. Skin: No rash.  Skin appears warm. Neurologic: Alert awake oriented to time place and person.  Moves all extremities. Psychiatric:  Appears normal.  Normal affect.   Labs on Admission: I have personally reviewed following labs and imaging studies  CBC: Recent Labs  Lab 02/16/18 1523  WBC 9.2  HGB 11.5*  HCT 34.9*  MCV 94.8  PLT 276   Basic Metabolic Panel: Recent Labs  Lab 02/16/18 1523  NA 135  K 4.0  CL 98*  CO2 24  GLUCOSE 81  BUN 14  CREATININE 1.19  CALCIUM 9.0   GFR: Estimated Creatinine Clearance: 56.4 mL/min (by C-G formula based on SCr of 1.19 mg/dL). Liver Function Tests: Recent Labs  Lab 02/16/18 1523  AST 23  ALT 13*  ALKPHOS 60  BILITOT 1.2  PROT 7.8  ALBUMIN 3.2*   Recent Labs  Lab 02/16/18 1523  LIPASE 19  No results for input(s): AMMONIA in the last 168 hours. Coagulation Profile: No results for input(s): INR, PROTIME in the last 168 hours. Cardiac Enzymes: No results for input(s): CKTOTAL, CKMB, CKMBINDEX, TROPONINI in the last 168 hours. BNP (last 3 results) No results for input(s): PROBNP in the last 8760 hours. HbA1C: No results for input(s): HGBA1C in the last 72 hours. CBG: No results for input(s): GLUCAP in the last 168 hours. Lipid Profile: No results for input(s): CHOL, HDL, LDLCALC, TRIG, CHOLHDL, LDLDIRECT in the last 72 hours. Thyroid Function Tests: No results for input(s): TSH, T4TOTAL, FREET4, T3FREE, THYROIDAB in the last 72 hours. Anemia Panel: No results for input(s): VITAMINB12, FOLATE, FERRITIN, TIBC, IRON, RETICCTPCT in the last 72 hours. Urine analysis:    Component Value Date/Time   COLORURINE YELLOW 02/16/2018 1935   APPEARANCEUR CLEAR 02/16/2018 1935   LABSPEC 1.013 02/16/2018 1935   PHURINE 5.0 02/16/2018 1935   GLUCOSEU NEGATIVE 02/16/2018 [place] NEGATIVE 02/16/2018 [place] NEGATIVE 02/16/2018 1935   KETONESUR 20 (A) 02/16/2018 1935   PROTEINUR NEGATIVE 02/16/2018 1935   UROBILINOGEN 0.2 10/10/2014 1037   NITRITE NEGATIVE 02/16/2018 1935   LEUKOCYTESUR NEGATIVE 02/16/2018 1935   Sepsis  Labs: @LABRCNTIP (procalcitonin:4,lacticidven:4) )No results found for this or any previous visit (from the past 240 hour(s)).   Radiological Exams on Admission: Dg Abdomen 1 View  Result Date: 02/16/2018 CLINICAL DATA:  Abdominal pain and distention for 2 days. EXAM: ABDOMEN - 1 VIEW COMPARISON:  Single-view of the abdomen 11/23/2017. CT abdomen and pelvis 11/21/2017. FINDINGS: A dilated loop of small bowel in the mid abdomen and left upper quadrant measures 5.1 cm in diameter. There is some gas and stool in the colon. Small locules of gas in the periphery of the left mid abdomen are presumably due to gas in stool but the appearance is atypical. The patient is status post prior hernia repair. Sclerotic bone lesions consistent with history of metastatic prostate carcinoma are identified as seen on the prior exams. IMPRESSION: Abnormal bowel-gas pattern highly suspicious for obstruction. Locules of gas in left mid abdomen are presumably due to gas in stool but could represent pneumatosis. CT abdomen and pelvis with oral and IV contrast is recommended for further evaluation. Electronically Signed   By: Inge Rise M.D.   On: 02/16/2018 17:31   Ct Abdomen Pelvis W Contrast  Result Date: 02/16/2018 CLINICAL DATA:  62 y/o M; central abdominal pain with distention for the last 2 days. Right-sided colostomy bag. History of Crohn's disease. EXAM: CT ABDOMEN AND PELVIS WITH CONTRAST TECHNIQUE: Multidetector CT imaging of the abdomen and pelvis was performed using the standard protocol following bolus administration of intravenous contrast. CONTRAST:  140m ISOVUE-300 IOPAMIDOL (ISOVUE-300) INJECTION 61% COMPARISON:  11/21/2017 CT abdomen and pelvis. FINDINGS: Lower chest: No acute abnormality. Hepatobiliary: No focal liver abnormality is seen. No gallstones, gallbladder wall thickening, or biliary dilatation. Pancreas: No inflammatory change or mass lesion identified. Stable mild dilatation of the main  pancreatic duct. Spleen: Normal in size without focal abnormality. Adrenals/Urinary Tract: Adrenal glands are unremarkable. Kidneys are normal, without renal calculi, focal lesion, or hydronephrosis. Layering dependent densities within the bladder, probably small stones. Mild bladder wall thickening. Stomach/Bowel: Diffuse dilatation of small bowel throughout the abdomen to the level of distal small bowel approximately 6 cm upstream to the right lower quadrant enterostomy where there is abrupt change in caliber and fecalization of stool contents probably representing chronic underlying obstruction from stricture or adhesion (series 3,  image 53). Mild wall thickening of small bowel in the right hemiabdomen, surrounding edema in mesenteric fat, and small volume of ascites. Vascular/Lymphatic: Aortic atherosclerosis. Multiple enlarged lymph nodes are present in the right lower quadrant anterior to psoas muscle measuring up to 14 x 13 mm (series 3, image 45), stable from prior study. Reproductive: Grossly stable infiltrative mass associated with the prostate gland with extension into the right lateral pelvic wall, right laboratory findings, right seminal vesicle, and with loss of intervening rectal fat plane possibly representing rectal involvement measuring up to 6.6 x 4.5 cm in axial plane (series 3, image 73). Other: Large left lateral abdominal wall spigelian hernia with neck measuring 3.7 x 3.2 cm (ML by CC) is grossly stable. No edema of bowel loops within the herniation sac. Tiny right upper quadrant abdominal hernia containing side loop of bowel (series 3, image 43). Right lower quadrant ileostomy. Musculoskeletal: Multiple sclerotic pelvic masses in the pelvis are grossly stable. IMPRESSION: 1. Diffuse dilatation of small bowel throughout the abdomen to the level of distal small bowel approximately 6 cm upstream to the right lower quadrant enterostomy where there is abrupt change in caliber and fecalization of  stool contents probably representing chronic underlying obstruction from stricture or adhesion. 2. Mild bowel wall thickening with mesenteric edema and small volume of ascites probably represents underlying infectious or inflammatory enteritis. 3. Large left-sided spigelian hernia containing bowel without inflammatory change. 4. Small right upper quadrant upper abdominal wall hernia containing side loop of small bowel. 5. Grossly stable prostate mass with local invasion as above. Multiple sclerotic pelvic metastasis are also stable. 6. Stable lymphadenopathy in right lower quadrant. 7. Stable mild pancreatic ductal dilatation. No downstream mass or lesion identified. Electronically Signed   By: Kristine Garbe M.D.   On: 02/16/2018 23:47     Assessment/Plan Principal Problem:   SBO (small bowel obstruction) (HCC) Active Problems:   Regional enteritis/Crohn's    1. Small bowel obstruction with history of Crohn's with CT scan showing hernia-appears to have improved.  Patient's ileostomy bag is full at this time.  We will closely observe.  We will start liquid diet and patient's Entocort.  Will await surgery opinion. 2. History of prostate cancer being followed by urologist. 3. Normocytic normochromic anemia likely from Crohn's follow CBC.  Appears to be chronic.   DVT prophylaxis: SCDs. Code Status: Full code. Family Communication: Discussed with patient. Disposition Plan: Home. Consults called: General surgery. Admission status: Inpatient.   Rise Patience MD [facility] Pager [phone].  If 7PM-7AM, please contact night-coverage [email or web address] Password [email or web address]  02/17/2018, 1:03 AM

## 2018-02-17 NOTE — ED Notes (Signed)
Attempted to call report; nurse in another room. Will call me back.

## 2018-02-18 DIAGNOSIS — K56609 Unspecified intestinal obstruction, unspecified as to partial versus complete obstruction: Secondary | ICD-10-CM | POA: Diagnosis not present

## 2018-02-18 DIAGNOSIS — K5669 Other partial intestinal obstruction: Secondary | ICD-10-CM | POA: Diagnosis not present

## 2018-02-18 DIAGNOSIS — K5000 Crohn's disease of small intestine without complications: Secondary | ICD-10-CM | POA: Diagnosis not present

## 2018-02-18 DIAGNOSIS — K56600 Partial intestinal obstruction, unspecified as to cause: Secondary | ICD-10-CM | POA: Diagnosis not present

## 2018-02-18 LAB — GLUCOSE, CAPILLARY
GLUCOSE-CAPILLARY: 146 mg/dL — AB (ref 65–99)
Glucose-Capillary: 117 mg/dL — ABNORMAL HIGH (ref 65–99)

## 2018-02-18 MED ORDER — POLYETHYLENE GLYCOL 3350 17 G PO PACK: 17.0000 g | PACK | Freq: Every day | ORAL | 0 refills | Status: DC

## 2018-02-18 MED ORDER — DOCUSATE SODIUM 100 MG PO CAPS: 100.0000 mg | ORAL_CAPSULE | Freq: Two times a day (BID) | ORAL | 2 refills | Status: DC

## 2018-02-18 MED ORDER — HEPARIN SOD (PORK) LOCK FLUSH 100 UNIT/ML IV SOLN
500.0000 [IU] | INTRAVENOUS | Status: AC | PRN
  Administered 2018-02-18: 500 [IU]

## 2018-02-18 NOTE — Progress Notes (Signed)
Discharged home today. Porta cath deaccessed by IV team, flushings with heparin done by same. Discharged instructions, personal belongings given to patient. Advised to pick up called in prescription to pharmacy of choice

## 2018-02-18 NOTE — Consult Note (Signed)
   [facility] CM Inpatient Consult   02/18/2018  Daniel Howard 06-Apr-1956 [id number]   Patient evaluated for community based chronic disease management services with [place] [facility] as a benefit of patient's Medicare Insurance. Spoke with patient at bedside to explain [name] services. Patient states he is interested but he would like his wife to look over the information.  He states that he drives but a church member takes him if needed.  His primary care provider Dr. Vic Blackbird. This primary care office is listed to provide the transition of care follow up.  Patient denies issues with transportation, medications, or resources in his community.  Patient accepted a brochure, 24 hour nurse advise line magnet and contact information.  He was encouraged to share information with his wife and call back for needs. Patient verbalized understanding. No needs verbalized.   Of note, [facility] Care Management services does not replace or interfere with any services that are arranged by inpatient case management or social work.  For additional questions or referrals please contact:    Natividad Brood, RN BSN [facility] Liaison  [phone] business mobile phone Toll free office [phone]

## 2018-02-18 NOTE — Care Management CC44 (Signed)
Condition Code 44 Documentation Completed  Patient Details  Name: ALEXY HELDT MRN: [id number] Date of Birth: [birth year]   Condition Code 44 given:   yes Patient signature on Condition Code 44 notice:   explained to patient, patient refused to siign Documentation of 2 MD's agreement:   yes Code 44 added to claim:   yes    Marilu Favre, RN 02/18/2018, 11:14 AM

## 2018-02-18 NOTE — Care Management Note (Signed)
Case Management Note  Patient Details  Name: Daniel Howard MRN: [id number] Date of Birth: 02-Dec-1956  Subjective/Objective:                    Action/Plan:  Discussed home health with patient. Patient does not feel at this time he needs home health Expected Discharge Date:  02/18/18               Expected Discharge Plan:  Home/Self Care  In-House Referral:     Discharge planning Services  CM Consult  Post Acute Care Choice:  Home Health Choice offered to:  Patient  DME Arranged:  [name] DME Agency:  NA  HH Arranged:  Patient Refused [place] Agency:  NA  Status of Service:  Completed, signed off  If discussed at [place] of Stay Meetings, dates discussed:    Additional Comments:  Marilu Favre, RN 02/18/2018, 11:18 AM

## 2018-02-18 NOTE — Progress Notes (Signed)
[facility] Progress Note      Assessment/Plan Principal Problem:   SBO (small bowel obstruction) (HCC) Active Problems:   Regional enteritis/Crohn's  SBO - having bowel function and tolerating diet  FEN: reg diet Follow up: as needed  DISPO: pt could go home today from a surgical standpoint    LOS: 1 day    Subjective: CC: SBO  No pain. Tolerating diet. No nausea or vomiting. Stool in ostomy  Objective: Vital signs in last 24 hours: Temp:  [98.3 F (36.8 C)-98.7 F (37.1 C)] 98.3 F (36.8 C) (02/28 0512) Pulse Rate:  [69-104] 69 (02/28 0512) Resp:  [17-18] 18 (02/28 0512) BP: (105-121)/(65-86) 105/68 (02/28 0512) SpO2:  [90 %-100 %] 98 % (02/28 0512) Weight:  [144 lb 6.4 oz (65.5 kg)] 144 lb 6.4 oz (65.5 kg) (02/27 1753) Last BM Date: 02/17/18(Colostomy)  Intake/Output from previous day: 02/27 0701 - 02/28 0700 In: 2346.7 [P.O.:610; I.V.:1736.7] Out: 1350 [Urine:425; Stool:925] Intake/Output this shift: No intake/output data recorded.  PE: General appearance: alert and cooperative Resp: rate and effort normal GI: soft, nontender, nondistended. Pink and patent stoma RLQ with stool and gas in bag. Midline scar with central bud of granulation tissue which he states will spit suture material from time to time. Large partially reducible LLQ hernia nontender Neurologic: Grossly normal Incision/Wound: suture granuloma midline, multiple healed incisions   Anti-infectives: Anti-infectives (From admission, onward)   None      Lab Results:  Recent Labs    02/16/18 1523 02/17/18 0323  WBC 9.2 5.6  HGB 11.5* 10.5*  HCT 34.9* 31.3*  PLT 317 282   BMET Recent Labs    02/16/18 1523 02/17/18 0323  NA 135 134*  K 4.0 4.1  CL 98* 102  CO2 24 20*  GLUCOSE 81 100*  BUN 14 14  CREATININE 1.19 1.20  CALCIUM 9.0 8.6*   PT/INR No results for input(s): LABPROT, INR in the last 72 hours. CMP     Component Value Date/Time   NA 134  (L) 02/17/2018 0323   NA 137 11/20/2017 0931   K 4.1 02/17/2018 0323   K 3.3 (L) 11/20/2017 0931   CL 102 02/17/2018 0323   CO2 20 (L) 02/17/2018 0323   CO2 27 11/20/2017 0931   GLUCOSE 100 (H) 02/17/2018 0323   GLUCOSE 99 11/20/2017 0931   BUN 14 02/17/2018 0323   BUN 16.6 11/20/2017 0931   CREATININE 1.20 02/17/2018 0323   CREATININE 1.04 01/13/2018 1145   CREATININE 0.99 12/02/2017 1249   CREATININE 1.2 11/20/2017 0931   CALCIUM 8.6 (L) 02/17/2018 0323   CALCIUM 8.9 11/20/2017 0931   PROT 7.8 02/16/2018 1523   PROT 7.5 11/20/2017 0931   ALBUMIN 3.2 (L) 02/16/2018 1523   ALBUMIN 3.1 (L) 11/20/2017 0931   AST 23 02/16/2018 1523   AST 17 01/13/2018 1145   AST 38 (H) 11/20/2017 0931   ALT 13 (L) 02/16/2018 1523   ALT 17 01/13/2018 1145   ALT 45 11/20/2017 0931   ALKPHOS 60 02/16/2018 1523   ALKPHOS 62 11/20/2017 0931   BILITOT 1.2 02/16/2018 1523   BILITOT 0.4 01/13/2018 1145   BILITOT 0.66 11/20/2017 0931   GFRNONAA >60 02/17/2018 0323   GFRNONAA >60 01/13/2018 1145   GFRNONAA 52 (L) 02/11/2017 1640   GFRAA >60 02/17/2018 0323   GFRAA >60 01/13/2018 1145   GFRAA 60 02/11/2017 1640   Lipase     Component Value Date/Time   LIPASE 19 02/16/2018 1523  Studies/Results: Dg Abdomen 1 View  Result Date: 02/16/2018 CLINICAL DATA:  Abdominal pain and distention for 2 days. EXAM: ABDOMEN - 1 VIEW COMPARISON:  Single-view of the abdomen 11/23/2017. CT abdomen and pelvis 11/21/2017. FINDINGS: A dilated loop of small bowel in the mid abdomen and left upper quadrant measures 5.1 cm in diameter. There is some gas and stool in the colon. Small locules of gas in the periphery of the left mid abdomen are presumably due to gas in stool but the appearance is atypical. The patient is status post prior hernia repair. Sclerotic bone lesions consistent with history of metastatic prostate carcinoma are identified as seen on the prior exams. IMPRESSION: Abnormal bowel-gas pattern highly  suspicious for obstruction. Locules of gas in left mid abdomen are presumably due to gas in stool but could represent pneumatosis. CT abdomen and pelvis with oral and IV contrast is recommended for further evaluation. Electronically Signed   By: Inge Rise M.D.   On: 02/16/2018 17:31   Ct Abdomen Pelvis W Contrast  Result Date: 02/16/2018 CLINICAL DATA:  62 y/o M; central abdominal pain with distention for the last 2 days. Right-sided colostomy bag. History of Crohn's disease. EXAM: CT ABDOMEN AND PELVIS WITH CONTRAST TECHNIQUE: Multidetector CT imaging of the abdomen and pelvis was performed using the standard protocol following bolus administration of intravenous contrast. CONTRAST:  160m ISOVUE-300 IOPAMIDOL (ISOVUE-300) INJECTION 61% COMPARISON:  11/21/2017 CT abdomen and pelvis. FINDINGS: Lower chest: No acute abnormality. Hepatobiliary: No focal liver abnormality is seen. No gallstones, gallbladder wall thickening, or biliary dilatation. Pancreas: No inflammatory change or mass lesion identified. Stable mild dilatation of the main pancreatic duct. Spleen: Normal in size without focal abnormality. Adrenals/Urinary Tract: Adrenal glands are unremarkable. Kidneys are normal, without renal calculi, focal lesion, or hydronephrosis. Layering dependent densities within the bladder, probably small stones. Mild bladder wall thickening. Stomach/Bowel: Diffuse dilatation of small bowel throughout the abdomen to the level of distal small bowel approximately 6 cm upstream to the right lower quadrant enterostomy where there is abrupt change in caliber and fecalization of stool contents probably representing chronic underlying obstruction from stricture or adhesion (series 3, image 53). Mild wall thickening of small bowel in the right hemiabdomen, surrounding edema in mesenteric fat, and small volume of ascites. Vascular/Lymphatic: Aortic atherosclerosis. Multiple enlarged lymph nodes are present in the right  lower quadrant anterior to psoas muscle measuring up to 14 x 13 mm (series 3, image 45), stable from prior study. Reproductive: Grossly stable infiltrative mass associated with the prostate gland with extension into the right lateral pelvic wall, right laboratory findings, right seminal vesicle, and with loss of intervening rectal fat plane possibly representing rectal involvement measuring up to 6.6 x 4.5 cm in axial plane (series 3, image 73). Other: Large left lateral abdominal wall spigelian hernia with neck measuring 3.7 x 3.2 cm (ML by CC) is grossly stable. No edema of bowel loops within the herniation sac. Tiny right upper quadrant abdominal hernia containing side loop of bowel (series 3, image 43). Right lower quadrant ileostomy. Musculoskeletal: Multiple sclerotic pelvic masses in the pelvis are grossly stable. IMPRESSION: 1. Diffuse dilatation of small bowel throughout the abdomen to the level of distal small bowel approximately 6 cm upstream to the right lower quadrant enterostomy where there is abrupt change in caliber and fecalization of stool contents probably representing chronic underlying obstruction from stricture or adhesion. 2. Mild bowel wall thickening with mesenteric edema and small volume of ascites probably represents underlying infectious  or inflammatory enteritis. 3. Large left-sided spigelian hernia containing bowel without inflammatory change. 4. Small right upper quadrant upper abdominal wall hernia containing side loop of small bowel. 5. Grossly stable prostate mass with local invasion as above. Multiple sclerotic pelvic metastasis are also stable. 6. Stable lymphadenopathy in right lower quadrant. 7. Stable mild pancreatic ductal dilatation. No downstream mass or lesion identified. Electronically Signed   By: Kristine Garbe M.D.   On: 02/16/2018 23:47      Kalman Drape , [facility] 02/18/2018, 9:18 AM Pager: [phone] Consults:  [phone] Mon-Fri 7:00 am-4:30 pm Sat-Sun 7:00 am-11:30 am

## 2018-02-18 NOTE — Discharge Summary (Signed)
Physician Discharge Summary  Daniel Howard MRN: [id number] DOB/AGE: 60-Sep-1957 60 y.o.  PCP: Alycia Rossetti, MD   Admit date: 02/16/2018 Discharge date: 02/18/2018  Discharge Diagnoses:    Principal Problem:   SBO (small bowel obstruction) [facility]) Active Problems:   Regional enteritis/Crohn's    Follow-up recommendations Follow-up with PCP in 3-5 days , including all  additional recommended appointments as below Follow-up CBC, CMP in 3-5 days       Allergies as of 02/18/2018      Reactions   Penicillins Hives   Has patient had a PCN reaction causing immediate rash, facial/tongue/throat swelling, SOB or lightheadedness with hypotension: Yes Has patient had a PCN reaction causing severe rash involving mucus membranes or skin necrosis: No Has patient had a PCN reaction that required hospitalization: No Has patient had a PCN reaction occurring within the last 10 years: No If all of the above answers are "NO", then may proceed with Cephalosporin use.      Medication List    TAKE these medications   budesonide 3 MG 24 hr capsule Commonly known as:  ENTOCORT EC Take 3 capsules (9 mg total) by mouth daily.   calcium carbonate 600 MG Tabs tablet Commonly known as:  OS-CAL Take 1,200 mg by mouth daily with breakfast.   CENTRUM SILVER ADULT 50+ Tabs Take 1 tablet by mouth daily.   docusate sodium 100 MG capsule Commonly known as:  COLACE Take 1 capsule (100 mg total) by mouth 2 (two) times daily.   Ferrous Sulfate 28 MG Tabs Take 1 tablet by mouth daily.   HYDROcodone-acetaminophen 5-325 MG tablet Commonly known as:  NORCO/VICODIN Take 1 tablet by mouth every 6 (six) hours as needed for moderate pain.   lidocaine-prilocaine cream Commonly known as:  EMLA Apply 1 application topically as needed (when accessing port).   mirtazapine 15 MG tablet Commonly known as:  REMERON TAKE 1 TABLET BY MOUTH AT BEDTIME What changed:    how much to take  how to take  this  when to take this   polyethylene glycol packet Commonly known as:  MIRALAX / GLYCOLAX Take 17 g by mouth daily.        Discharge Condition:  Stable   Discharge Instructions Get Medicines reviewed and adjusted: Please take all your medications with you for your next visit with your Primary MD  Please request your Primary MD to go over all hospital tests and procedure/radiological results at the follow up, please ask your Primary MD to get all Hospital records sent to his/her office.  If you experience worsening of your admission symptoms, develop shortness of breath, life threatening emergency, suicidal or homicidal thoughts you must seek medical attention immediately by calling 911 or calling your MD immediately if symptoms less severe.  You must read complete instructions/literature along with all the possible adverse reactions/side effects for all the Medicines you take and that have been prescribed to you. Take any new Medicines after you have completely understood and accpet all the possible adverse reactions/side effects.   Do not drive when taking Pain medications.   Do not take more than prescribed Pain, Sleep and Anxiety Medications  Special Instructions: If you have smoked or chewed Tobacco in the last 2 yrs please stop smoking, stop any regular Alcohol and or any Recreational drug use.  Wear Seat belts while driving.  Please note  You were cared for by a hospitalist during your hospital stay. Once you are discharged, your primary care  physician will handle any further medical issues. Please note that NO REFILLS for any discharge medications will be authorized once you are discharged, as it is imperative that you return to your primary care physician (or establish a relationship with a primary care physician if you do not have one) for your aftercare needs so that they can reassess your need for medications and monitor your lab values.     Allergies  Allergen  Reactions  . Penicillins Hives    Has patient had a PCN reaction causing immediate rash, facial/tongue/throat swelling, SOB or lightheadedness with hypotension: Yes Has patient had a PCN reaction causing severe rash involving mucus membranes or skin necrosis: No Has patient had a PCN reaction that required hospitalization: No Has patient had a PCN reaction occurring within the last 10 years: No If all of the above answers are "NO", then may proceed with Cephalosporin use.       Disposition: 01-Home or Self Care   Consults: Gen. surgery    Significant Diagnostic Studies:  Dg Abdomen 1 View  Result Date: 02/16/2018 CLINICAL DATA:  Abdominal pain and distention for 2 days. EXAM: ABDOMEN - 1 VIEW COMPARISON:  Single-view of the abdomen 11/23/2017. CT abdomen and pelvis 11/21/2017. FINDINGS: A dilated loop of small bowel in the mid abdomen and left upper quadrant measures 5.1 cm in diameter. There is some gas and stool in the colon. Small locules of gas in the periphery of the left mid abdomen are presumably due to gas in stool but the appearance is atypical. The patient is status post prior hernia repair. Sclerotic bone lesions consistent with history of metastatic prostate carcinoma are identified as seen on the prior exams. IMPRESSION: Abnormal bowel-gas pattern highly suspicious for obstruction. Locules of gas in left mid abdomen are presumably due to gas in stool but could represent pneumatosis. CT abdomen and pelvis with oral and IV contrast is recommended for further evaluation. Electronically Signed   By: Inge Rise M.D.   On: 02/16/2018 17:31   Ct Abdomen Pelvis W Contrast  Result Date: 02/16/2018 CLINICAL DATA:  62 y/o M; central abdominal pain with distention for the last 2 days. Right-sided colostomy bag. History of Crohn's disease. EXAM: CT ABDOMEN AND PELVIS WITH CONTRAST TECHNIQUE: Multidetector CT imaging of the abdomen and pelvis was performed using the standard protocol  following bolus administration of intravenous contrast. CONTRAST:  126m ISOVUE-300 IOPAMIDOL (ISOVUE-300) INJECTION 61% COMPARISON:  11/21/2017 CT abdomen and pelvis. FINDINGS: Lower chest: No acute abnormality. Hepatobiliary: No focal liver abnormality is seen. No gallstones, gallbladder wall thickening, or biliary dilatation. Pancreas: No inflammatory change or mass lesion identified. Stable mild dilatation of the main pancreatic duct. Spleen: Normal in size without focal abnormality. Adrenals/Urinary Tract: Adrenal glands are unremarkable. Kidneys are normal, without renal calculi, focal lesion, or hydronephrosis. Layering dependent densities within the bladder, probably small stones. Mild bladder wall thickening. Stomach/Bowel: Diffuse dilatation of small bowel throughout the abdomen to the level of distal small bowel approximately 6 cm upstream to the right lower quadrant enterostomy where there is abrupt change in caliber and fecalization of stool contents probably representing chronic underlying obstruction from stricture or adhesion (series 3, image 53). Mild wall thickening of small bowel in the right hemiabdomen, surrounding edema in mesenteric fat, and small volume of ascites. Vascular/Lymphatic: Aortic atherosclerosis. Multiple enlarged lymph nodes are present in the right lower quadrant anterior to psoas muscle measuring up to 14 x 13 mm (series 3, image 45), stable from prior study.  Reproductive: Grossly stable infiltrative mass associated with the prostate gland with extension into the right lateral pelvic wall, right laboratory findings, right seminal vesicle, and with loss of intervening rectal fat plane possibly representing rectal involvement measuring up to 6.6 x 4.5 cm in axial plane (series 3, image 73). Other: Large left lateral abdominal wall spigelian hernia with neck measuring 3.7 x 3.2 cm (ML by CC) is grossly stable. No edema of bowel loops within the herniation sac. Tiny right upper  quadrant abdominal hernia containing side loop of bowel (series 3, image 43). Right lower quadrant ileostomy. Musculoskeletal: Multiple sclerotic pelvic masses in the pelvis are grossly stable. IMPRESSION: 1. Diffuse dilatation of small bowel throughout the abdomen to the level of distal small bowel approximately 6 cm upstream to the right lower quadrant enterostomy where there is abrupt change in caliber and fecalization of stool contents probably representing chronic underlying obstruction from stricture or adhesion. 2. Mild bowel wall thickening with mesenteric edema and small volume of ascites probably represents underlying infectious or inflammatory enteritis. 3. Large left-sided spigelian hernia containing bowel without inflammatory change. 4. Small right upper quadrant upper abdominal wall hernia containing side loop of small bowel. 5. Grossly stable prostate mass with local invasion as above. Multiple sclerotic pelvic metastasis are also stable. 6. Stable lymphadenopathy in right lower quadrant. 7. Stable mild pancreatic ductal dilatation. No downstream mass or lesion identified. Electronically Signed   By: Kristine Garbe M.D.   On: 02/16/2018 23:47        Filed Weights   02/16/18 2025 02/17/18 1753  Weight: 66.2 kg (146 lb) 65.5 kg (144 lb 6.4 oz)     Microbiology: No results found for this or any previous visit (from the past 240 hour(s)).     Blood Culture    Component Value Date/Time   SDES STOOL 07/09/2010 1426   SPECREQUEST IMMUNE:NORM 07/09/2010 1426   CULT  07/09/2010 1426    NO SALMONELLA, SHIGELLA, CAMPYLOBACTER, OR YERSINIA ISOLATED   REPTSTATUS 07/13/2010 FINAL 07/09/2010 1426      Labs: Results for orders placed or performed during the hospital encounter of 02/16/18 (from the past 48 hour(s))  Lipase, blood     Status: None   Collection Time: 02/16/18  3:23 PM  Result Value Ref Range   Lipase 19 11 - 51 U/L    Comment: Performed at [facility], [place] [address] [place], [place] [address]  Comprehensive metabolic panel     Status: Abnormal   Collection Time: 02/16/18  3:23 PM  Result Value Ref Range   Sodium 135 135 - 145 mmol/L   Potassium 4.0 3.5 - 5.1 mmol/L   Chloride 98 (L) 101 - 111 mmol/L   CO2 24 22 - 32 mmol/L   Glucose, Bld 81 65 - 99 mg/dL   BUN 14 6 - 20 mg/dL   Creatinine, Ser 1.19 0.61 - 1.24 mg/dL   Calcium 9.0 8.9 - 10.3 mg/dL   Total Protein 7.8 6.5 - 8.1 g/dL   Albumin 3.2 (L) 3.5 - 5.0 g/dL   AST 23 15 - 41 U/L   ALT 13 (L) 17 - 63 U/L   Alkaline Phosphatase 60 38 - 126 U/L   Total Bilirubin 1.2 0.3 - 1.2 mg/dL   GFR calc non Af Amer >60 >60 mL/min   GFR calc Af Amer >60 >60 mL/min    Comment: (NOTE) The eGFR has been calculated using the CKD EPI equation. This calculation has not been  validated in all clinical situations. eGFR's persistently <60 mL/min signify possible Chronic Kidney Disease.    Anion gap 13 5 - 15    Comment: Performed at [facility] [address], [place], [place] [address]  CBC     Status: Abnormal   Collection Time: 02/16/18  3:23 PM  Result Value Ref Range   WBC 9.2 4.0 - 10.5 K/uL   RBC 3.68 (L) 4.22 - 5.81 MIL/uL   Hemoglobin 11.5 (L) 13.0 - 17.0 g/dL   HCT 34.9 (L) 39.0 - 52.0 %   MCV 94.8 78.0 - 100.0 fL   MCH 31.3 26.0 - 34.0 pg   MCHC 33.0 30.0 - 36.0 g/dL   RDW 12.6 11.5 - 15.5 %   Platelets 317 150 - 400 K/uL    Comment: Performed at [facility], [place] [address], [place], [place] [address]  Urinalysis, Routine w reflex microscopic     Status: Abnormal   Collection Time: 02/16/18  7:35 PM  Result Value Ref Range   Color, Urine YELLOW YELLOW   APPearance CLEAR CLEAR   Specific Gravity, Urine 1.013 1.005 - 1.030   pH 5.0 5.0 - 8.0   Glucose, UA NEGATIVE NEGATIVE mg/dL   Hgb urine dipstick NEGATIVE NEGATIVE   Bilirubin Urine NEGATIVE NEGATIVE   Ketones, ur 20 (A) NEGATIVE mg/dL   Protein, ur NEGATIVE NEGATIVE mg/dL   Nitrite NEGATIVE NEGATIVE    Leukocytes, UA NEGATIVE NEGATIVE    Comment: Performed at [facility] [address], [place], [place] [address]  Basic metabolic panel     Status: Abnormal   Collection Time: 02/17/18  3:23 AM  Result Value Ref Range   Sodium 134 (L) 135 - 145 mmol/L   Potassium 4.1 3.5 - 5.1 mmol/L   Chloride 102 101 - 111 mmol/L   CO2 20 (L) 22 - 32 mmol/L   Glucose, Bld 100 (H) 65 - 99 mg/dL   BUN 14 6 - 20 mg/dL   Creatinine, Ser 1.20 0.61 - 1.24 mg/dL   Calcium 8.6 (L) 8.9 - 10.3 mg/dL   GFR calc non Af Amer >60 >60 mL/min   GFR calc Af Amer >60 >60 mL/min    Comment: (NOTE) The eGFR has been calculated using the CKD EPI equation. This calculation has not been validated in all clinical situations. eGFR's persistently <60 mL/min signify possible Chronic Kidney Disease.    Anion gap 12 5 - 15    Comment: Performed at [facility] [address], [place], [place] [address]  CBC     Status: Abnormal   Collection Time: 02/17/18  3:23 AM  Result Value Ref Range   WBC 5.6 4.0 - 10.5 K/uL   RBC 3.33 (L) 4.22 - 5.81 MIL/uL   Hemoglobin 10.5 (L) 13.0 - 17.0 g/dL   HCT 31.3 (L) 39.0 - 52.0 %   MCV 94.0 78.0 - 100.0 fL   MCH 31.5 26.0 - 34.0 pg   MCHC 33.5 30.0 - 36.0 g/dL   RDW 12.6 11.5 - 15.5 %   Platelets 282 150 - 400 K/uL    Comment: Performed at [facility], [place] [address] [place], [place] [address]  CBG monitoring, ED     Status: Abnormal   Collection Time: 02/17/18  5:37 PM  Result Value Ref Range   Glucose-Capillary 122 (H) 65 - 99 mg/dL  Glucose, capillary     Status: Abnormal   Collection Time: 02/18/18  2:02 AM  Result Value Ref Range   Glucose-Capillary 117 (H) 65 - 99 mg/dL  Glucose, capillary     Status: Abnormal   Collection Time: 02/18/18  9:55 AM  Result Value Ref Range   Glucose-Capillary 146 (H) 65 - 99 mg/dL     Lipid Panel     Component Value Date/Time   CHOL 176 01/12/2018 1109   TRIG 92 01/12/2018 1109   HDL 110 01/12/2018 1109    CHOLHDL 1.6 01/12/2018 1109   VLDL 19 10/29/2015 0954   LDLCALC 46 10/29/2015 0954     No results found for: HGBA1C   Lab Results  Component Value Date   LDLCALC 46 10/29/2015   CREATININE 1.20 02/17/2018     HPI :  Daniel Howard is a 62 y.o. male with history of Crohn's disease and metastatic prostate cancer presents to the ER because of increasing abdominal pain.  Patient has been having pain around the right lower quadrant around the ileostomy area.  Pain was constant with no nausea vomiting.  Has been taking his medications.  ED Course: In the ER CT scan shows features concerning for small bowel obstruction and hernia and on-call general surgeon was consulted and patient was admitted for further management.    HOSPITAL COURSE:   1. Small bowel obstruction with history of Crohn's with CT scan showing hernia-appears to have improved.  Patient's ileostomy bag is full at this time. We will initially placed on liquid diet and patient's Entocort.  Advance to soft diet , ok to dc from a surgery standpoint  2. History of prostate cancer being followed by urologist. 3. Normocytic normochromic anemia likely from Crohn's follow CBC.  Appears to be chronic.    Discharge Exam:  Blood pressure 105/68, pulse 69, temperature 98.3 F (36.8 C), temperature source Oral, resp. rate 18, height 5' 4"  (1.626 m), weight 65.5 kg (144 lb 6.4 oz), SpO2 98 %. Cardiovascular: S1-S2 heard no murmurs appreciated. Abdomen: Soft nontender ileostomy bag is full. Musculoskeletal: No edema.  No joint effusion. Skin: No rash.  Skin appears warm. Neurologic: Alert awake oriented to time place and person.  Moves all extremities. Psychiatric: Appears normal.  Normal affect.       Follow-up Information    Twin, [name], MD. Call.   Specialty:  Family Medicine Why:  Hospital follow-up in 3-5 days, follow-up CBC, BMP Contact information: [place]  [phone]           Signed: Reyne Dumas 02/18/2018, 10:26 AM        Time spent >1 hour

## 2018-02-25 DIAGNOSIS — K5090 Crohn's disease, unspecified, without complications: Secondary | ICD-10-CM | POA: Diagnosis not present

## 2018-02-25 DIAGNOSIS — Z933 Colostomy status: Secondary | ICD-10-CM | POA: Diagnosis not present

## 2018-03-04 DIAGNOSIS — C61 Malignant neoplasm of prostate: Secondary | ICD-10-CM | POA: Diagnosis not present

## 2018-03-08 DIAGNOSIS — C61 Malignant neoplasm of prostate: Secondary | ICD-10-CM | POA: Diagnosis not present

## 2018-03-16 ENCOUNTER — Encounter (HOSPITAL_COMMUNITY): Payer: Self-pay

## 2018-03-16 ENCOUNTER — Inpatient Hospital Stay (HOSPITAL_COMMUNITY)
Admission: EM | Admit: 2018-03-16 | Discharge: 2018-03-19 | DRG: 389 | Disposition: A | Discharge status: Home / Self Care | Payer: Medicare Other | Attending: Internal Medicine | Admitting: Internal Medicine

## 2018-03-16 ENCOUNTER — Other Ambulatory Visit: Payer: Self-pay

## 2018-03-16 ENCOUNTER — Emergency Department (HOSPITAL_COMMUNITY): Payer: Medicare Other

## 2018-03-16 DIAGNOSIS — C7951 Secondary malignant neoplasm of bone: Secondary | ICD-10-CM | POA: Diagnosis present

## 2018-03-16 DIAGNOSIS — Z8042 Family history of malignant neoplasm of prostate: Secondary | ICD-10-CM

## 2018-03-16 DIAGNOSIS — Z818 Family history of other mental and behavioral disorders: Secondary | ICD-10-CM

## 2018-03-16 DIAGNOSIS — Z800 Family history of malignant neoplasm of digestive organs: Secondary | ICD-10-CM

## 2018-03-16 DIAGNOSIS — F419 Anxiety disorder, unspecified: Secondary | ICD-10-CM | POA: Diagnosis present

## 2018-03-16 DIAGNOSIS — R197 Diarrhea, unspecified: Secondary | ICD-10-CM

## 2018-03-16 DIAGNOSIS — D649 Anemia, unspecified: Secondary | ICD-10-CM | POA: Diagnosis present

## 2018-03-16 DIAGNOSIS — Z8249 Family history of ischemic heart disease and other diseases of the circulatory system: Secondary | ICD-10-CM

## 2018-03-16 DIAGNOSIS — Z79891 Long term (current) use of opiate analgesic: Secondary | ICD-10-CM | POA: Diagnosis not present

## 2018-03-16 DIAGNOSIS — Z880 Allergy status to penicillin: Secondary | ICD-10-CM | POA: Diagnosis not present

## 2018-03-16 DIAGNOSIS — Z79899 Other long term (current) drug therapy: Secondary | ICD-10-CM

## 2018-03-16 DIAGNOSIS — K50819 Crohn's disease of both small and large intestine with unspecified complications: Secondary | ICD-10-CM | POA: Diagnosis present

## 2018-03-16 DIAGNOSIS — Z8711 Personal history of peptic ulcer disease: Secondary | ICD-10-CM

## 2018-03-16 DIAGNOSIS — K5651 Intestinal adhesions [bands], with partial obstruction: Principal | ICD-10-CM | POA: Diagnosis present

## 2018-03-16 DIAGNOSIS — K50912 Crohn's disease, unspecified, with intestinal obstruction: Secondary | ICD-10-CM | POA: Diagnosis not present

## 2018-03-16 DIAGNOSIS — K56609 Unspecified intestinal obstruction, unspecified as to partial versus complete obstruction: Secondary | ICD-10-CM | POA: Diagnosis not present

## 2018-03-16 DIAGNOSIS — Z87891 Personal history of nicotine dependence: Secondary | ICD-10-CM | POA: Diagnosis not present

## 2018-03-16 DIAGNOSIS — K439 Ventral hernia without obstruction or gangrene: Secondary | ICD-10-CM | POA: Diagnosis not present

## 2018-03-16 DIAGNOSIS — K5000 Crohn's disease of small intestine without complications: Secondary | ICD-10-CM | POA: Diagnosis not present

## 2018-03-16 DIAGNOSIS — Z825 Family history of asthma and other chronic lower respiratory diseases: Secondary | ICD-10-CM | POA: Diagnosis not present

## 2018-03-16 DIAGNOSIS — K56699 Other intestinal obstruction unspecified as to partial versus complete obstruction: Secondary | ICD-10-CM | POA: Diagnosis not present

## 2018-03-16 DIAGNOSIS — C61 Malignant neoplasm of prostate: Secondary | ICD-10-CM | POA: Diagnosis not present

## 2018-03-16 DIAGNOSIS — Z803 Family history of malignant neoplasm of breast: Secondary | ICD-10-CM | POA: Diagnosis not present

## 2018-03-16 DIAGNOSIS — R109 Unspecified abdominal pain: Secondary | ICD-10-CM | POA: Diagnosis not present

## 2018-03-16 DIAGNOSIS — Z933 Colostomy status: Secondary | ICD-10-CM

## 2018-03-16 DIAGNOSIS — K5090 Crohn's disease, unspecified, without complications: Secondary | ICD-10-CM | POA: Diagnosis present

## 2018-03-16 LAB — COMPREHENSIVE METABOLIC PANEL
ALT: 17 U/L (ref 17–63)
AST: 24 U/L (ref 15–41)
Albumin: 3.3 g/dL — ABNORMAL LOW (ref 3.5–5.0)
Alkaline Phosphatase: 53 U/L (ref 38–126)
Anion gap: 8 (ref 5–15)
BUN: 14 mg/dL (ref 6–20)
CHLORIDE: 104 mmol/L (ref 101–111)
CO2: 26 mmol/L (ref 22–32)
Calcium: 9.2 mg/dL (ref 8.9–10.3)
Creatinine, Ser: 1.23 mg/dL (ref 0.61–1.24)
GFR calc Af Amer: >60 mL/min (ref >60)
Glucose, Bld: 103 mg/dL — ABNORMAL HIGH (ref 65–99)
Potassium: 4.2 mmol/L (ref 3.5–5.1)
Sodium: 138 mmol/L (ref 135–145)
Total Bilirubin: 1.2 mg/dL (ref 0.3–1.2)
Total Protein: 7.6 g/dL (ref 6.5–8.1)

## 2018-03-16 LAB — CBC
HCT: 36 % — ABNORMAL LOW (ref 39.0–52.0)
Hemoglobin: 11.8 g/dL — ABNORMAL LOW (ref 13.0–17.0)
MCH: 32 pg (ref 26.0–34.0)
MCHC: 32.8 g/dL (ref 30.0–36.0)
MCV: 97.6 fL (ref 78.0–100.0)
PLATELETS: 306 10*3/uL (ref 150–400)
RBC: 3.69 MIL/uL — ABNORMAL LOW (ref 4.22–5.81)
RDW: 13.8 % (ref 11.5–15.5)
WBC: 8.4 10*3/uL (ref 4.0–10.5)

## 2018-03-16 LAB — LIPASE, BLOOD: LIPASE: 22 U/L (ref 11–51)

## 2018-03-16 LAB — SEDIMENTATION RATE: Sed Rate: 49 mm/hr — ABNORMAL HIGH (ref 0–16)

## 2018-03-16 LAB — C-REACTIVE PROTEIN: CRP: 2.6 mg/dL — ABNORMAL HIGH (ref <1.0)

## 2018-03-16 MED ORDER — BUDESONIDE 3 MG PO CPEP
9.0000 mg | ORAL_CAPSULE | Freq: Every day | ORAL | Status: DC
  Administered 2018-03-17 – 2018-03-19 (×3): 9 mg via ORAL
  Filled 2018-03-16 (×3): qty 3

## 2018-03-16 MED ORDER — DICYCLOMINE HCL 10 MG PO CAPS
10.0000 mg | ORAL_CAPSULE | Freq: Once | ORAL | Status: AC
  Administered 2018-03-16: 10 mg via ORAL
  Filled 2018-03-16: qty 1

## 2018-03-16 MED ORDER — MORPHINE SULFATE (PF) 4 MG/ML IV SOLN
4.0000 mg | Freq: Once | INTRAVENOUS | Status: AC
  Administered 2018-03-16: 4 mg via INTRAVENOUS
  Filled 2018-03-16: qty 1

## 2018-03-16 MED ORDER — ACETAMINOPHEN 325 MG PO TABS: 650.0000 mg | ORAL_TABLET | Freq: Four times a day (QID) | ORAL | Status: DC | PRN

## 2018-03-16 MED ORDER — MORPHINE SULFATE (PF) 4 MG/ML IV SOLN
2.0000 mg | INTRAVENOUS | Status: DC | PRN
  Administered 2018-03-17 (×3): 4 mg via INTRAVENOUS
  Administered 2018-03-19: 2 mg via INTRAVENOUS
  Filled 2018-03-16 (×4): qty 1

## 2018-03-16 MED ORDER — ACETAMINOPHEN 650 MG RE SUPP: 650.0000 mg | Freq: Four times a day (QID) | RECTAL | Status: DC | PRN

## 2018-03-16 MED ORDER — LACTATED RINGERS IV BOLUS
1000.0000 mL | Freq: Once | INTRAVENOUS | Status: AC
  Administered 2018-03-16: 1000 mL via INTRAVENOUS

## 2018-03-16 MED ORDER — ONDANSETRON HCL 4 MG PO TABS: 4.0000 mg | ORAL_TABLET | Freq: Four times a day (QID) | ORAL | Status: DC | PRN

## 2018-03-16 MED ORDER — ONDANSETRON HCL 4 MG/2ML IJ SOLN: 4.0000 mg | Freq: Four times a day (QID) | INTRAMUSCULAR | Status: DC | PRN

## 2018-03-16 MED ORDER — SODIUM CHLORIDE 0.9 % IV SOLN
INTRAVENOUS | Status: AC
  Administered 2018-03-17 (×2): via INTRAVENOUS

## 2018-03-16 MED ORDER — IOPAMIDOL (ISOVUE-300) INJECTION 61%
INTRAVENOUS | Status: AC
  Administered 2018-03-16: 100 mL
  Filled 2018-03-16: qty 100

## 2018-03-16 MED ORDER — ONDANSETRON HCL 4 MG/2ML IJ SOLN
4.0000 mg | Freq: Once | INTRAMUSCULAR | Status: AC
Start: 2018-03-16 — End: 2018-03-16
  Administered 2018-03-16: 4 mg via INTRAVENOUS
  Filled 2018-03-16: qty 2

## 2018-03-16 NOTE — Consult Note (Signed)
Surgical Consultation Requesting provider: Dr. Myna Hidalgo  CC: abdominal pain  HPI: Very nice 62yo man with h/o Crohn's and multiple abdominal surgeries as well as metastatic prostate cancer who presents to ER with abdominal cramping and diarrhea as well as intermittent nausea. Similar presentation about 1 month ago more obstructive- see Dr. Amie Portland consult note from 02/17/18. He was admitted then for a couple days and was able to discharge home. Currently he is not having pain or nausea. Has some liquid stool in the ostomy bag.  Of note, his surgeon in [place] who has told him he should never have any more surgery...  Allergies  Allergen Reactions  . Penicillins Hives    Has patient had a PCN reaction causing immediate rash, facial/tongue/throat swelling, SOB or lightheadedness with hypotension: Yes Has patient had a PCN reaction causing severe rash involving mucus membranes or skin necrosis: No Has patient had a PCN reaction that required hospitalization: No Has patient had a PCN reaction occurring within the last 10 years: No If all of the above answers are "NO", then may proceed with Cephalosporin use.     Past Medical History:  Diagnosis Date  . Abnormal finding of biliary tract    MRCP shows pancreatic/biliary tract dilation. EUS 2010 confirmed dilation but no chronic pancreaitis or mass. Vascular ectasia crimpoing distal CBD.   [name] Anxiety   . 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 Duodenal ulcer 2010   nsaids  . History of blood transfusion 1992   "related to colon OR"  . Peristomal hernia   . Prostate cancer ([place]) 2018  . Small bowel  obstruction ([place]) 10/2017; 11/21/2017; 02/16/2018  . Spigelian hernia    bilateral    Past Surgical History:  Procedure Laterality Date  . APPENDECTOMY  1992   at time of exp laparotomy at which time he was noted to have fistulizing Crohn's rather than UC  . COLON SURGERY    . COLONOSCOPY [name] 08/23/2014   [id number] proctoscopy with possible fistulous opening in thebase of rectal/anal stump.    . COLOSTOMY  1992   transverse loop colostomy secondary to a stricture  . ESOPHAGOGASTRODUODENOSCOPY  05/2009   SLF: multiple antral erosions, large ulcer at ansatomosis (postsurgical changes at duodenal bulb and second portion of duodenum) BX c/x NSAIDS.  . EUS  10/04/2009   Dr. Estill Bakes dilated CBD and main pancreatic duct.  No pancreatic  . EXPLORATORY LAPAROTOMY  1992  . FLEXIBLE SIGMOIDOSCOPY  1988   Dr. Laural Golden- suggested rohn's disease but the biopsies were not collaborative.  [name] FLEXIBLE SIGMOIDOSCOPY [name] 09/08/2016   Procedure: FLEXIBLE SIGMOIDOSCOPY;  Surgeon: Wonda Horner, MD;  Location: [facility] ENDOSCOPY;  Service: Gastroenterology;  Laterality: [name];  . HEMICOLOECTOMY W/ ANASTOMOSIS  1993   R- Dr.DeMason   . HERNIA REPAIR  1996   incarcerated periostial hernia with additional surgery in 1999  . IR FLUORO GUIDE PORT INSERTION RIGHT  04/03/2017  . IR US GUIDE VASC ACCESS RIGHT  04/03/2017    Family History  Problem Relation Age of Onset  . Cancer Father        prostate   . Prostate cancer Father   . Colon cancer Father 70  .  Hypertension Sister   . Cancer Sister   . Depression Sister   . Breast cancer Sister   . COPD Sister   . Aneurysm Brother        deceased, brain aneurysm    Social History   Socioeconomic History  . Marital status: Married    Spouse name: Not on file  . Number of children: 40  . Years of education: Not on file  . Highest education level: Not on file  Occupational History  . Occupation: disabled    Fish farm manager: UNEMPLOYED  Social Needs  . Financial  resource strain: Not on file  . Food insecurity:    Worry: Not on file    Inability: Not on file  . Transportation needs:    Medical: Not on file    Non-medical: Not on file  Tobacco Use  . Smoking status: Former Smoker    Packs/day: 0.75    Years: 32.00    Pack years: 24.00    Types: Cigarettes    Last attempt to quit: 12/21/2009    Years since quitting: 8.2  . Smokeless tobacco: Never Used  Substance and Sexual Activity  . Alcohol use: No    Alcohol/week: 0.0 oz    Comment: Former drinker  . Drug use: No  . Sexual activity: Never    Partners: Female  Lifestyle  . Physical activity:    Days per week: Not on file    Minutes per session: Not on file  . Stress: Not on file  Relationships  . Social connections:    Talks on phone: Not on file    Gets together: Not on file    Attends religious service: Not on file    Active member of club or organization: Not on file    Attends meetings of clubs or organizations: Not on file    Relationship status: Not on file  Other Topics Concern  . Not on file  Social History Narrative  . Not on file    Current Facility-Administered Medications on File Prior to Encounter  Medication Dose Route Frequency Provider Last Rate Last Dose  . sodium chloride flush (NS) 0.9 % injection 10 mL  10 mL Intravenous PRN [name], MD   10 mL at 11/20/17 3875   Current Outpatient Medications on File Prior to Encounter  Medication Sig Dispense Refill  . budesonide (ENTOCORT EC) 3 MG 24 hr capsule Take 3 capsules (9 mg total) by mouth daily. 90 capsule 0  . calcium carbonate (OS-CAL) 600 MG TABS tablet Take 1,200 mg by mouth daily with breakfast.    . Ferrous Sulfate 28 MG TABS Take 1 tablet by mouth daily.    [name] HYDROcodone-acetaminophen (NORCO/VICODIN) 5-325 MG tablet Take 1 tablet by mouth every 6 (six) hours as needed for moderate pain. 60 tablet 0  . lidocaine-prilocaine (EMLA) cream Apply 1 application topically as needed (when accessing  port).    . mirtazapine (REMERON) 15 MG tablet TAKE 1 TABLET BY MOUTH AT BEDTIME (Patient taking differently: TAKE 15 mg TABLET BY MOUTH AT BEDTIME) 90 tablet 1  . Multiple Vitamins-Minerals (CENTRUM SILVER ADULT 50+) TABS Take 1 tablet by mouth daily.    [name] docusate sodium (COLACE) 100 MG capsule Take 1 capsule (100 mg total) by mouth 2 (two) times daily. (Patient not taking: Reported on 03/16/2018) 60 capsule 2    Review of Systems: a complete, 10pt review of systems was completed with pertinent positives and negatives as documented in the HPI  Physical Exam: Vitals:   03/16/18 2000 03/16/18 2030  BP: 133/84 128/83  Pulse: 84 72  Resp:    Temp:    SpO2: 100% 98%   Gen: A&Ox3, no distress  Head: normocephalic, atraumatic Eyes: extraocular motions intact, anicteric.  Neck: supple without mass or thyromegaly Chest: unlabored respirations, symmetrical air entry, clear bilaterally   Cardiovascular: RRR with palpable distal pulses, no pedal edema Abdomen: soft, nontender. Partially reducible but large hernia with some distention in left lower abdomen (prior colostomy site). Multiple surgical scars. RLQ ileostomy with 4-5cm prolapse, pink and patent with liquid stool in bag. Extremities: warm, without edema, no deformities  Neuro: grossly intact Psych: appropriate mood and affect, normal insight  Skin: warm and dry   CBC Latest Ref Rng & Units 03/16/2018 02/17/2018 02/16/2018  WBC 4.0 - 10.5 K/uL 8.4 5.6 9.2  Hemoglobin 13.0 - 17.0 g/dL 11.8(L) 10.5(L) 11.5(L)  Hematocrit 39.0 - 52.0 % 36.0(L) 31.3(L) 34.9(L)  Platelets 150 - 400 K/uL 306 282 317    CMP Latest Ref Rng & Units 03/16/2018 02/17/2018 02/16/2018  Glucose 65 - 99 mg/dL 103(H) 100(H) 81  BUN 6 - 20 mg/dL 14 14 14   Creatinine 0.61 - 1.24 mg/dL 1.23 1.20 1.19  Sodium 135 - 145 mmol/L 138 134(L) 135  Potassium 3.5 - 5.1 mmol/L 4.2 4.1 4.0  Chloride 101 - 111 mmol/L 104 102 98(L)  CO2 22 - 32 mmol/L 26 20(L) 24  Calcium 8.9 -  10.3 mg/dL 9.2 8.6(L) 9.0  Total Protein 6.5 - 8.1 g/dL 7.6 - 7.8  Total Bilirubin 0.3 - 1.2 mg/dL 1.2 - 1.2  Alkaline Phos 38 - 126 U/L 53 - 60  AST 15 - 41 U/L 24 - 23  ALT 17 - 63 U/L 17 - 13(L)    Lab Results  Component Value Date   INR 1.02 04/03/2017   INR 1.02 03/16/2017   INR 1.04 05/23/2013    Imaging: Ct Abdomen Pelvis W Contrast  Result Date: 03/16/2018 CLINICAL DATA:  62 year old male with abdominal pain. Concern for gastroenteritis or colitis. History of Crohn's disease and hemicolectomy and colostomy. EXAM: CT ABDOMEN AND PELVIS WITH CONTRAST TECHNIQUE: Multidetector CT imaging of the abdomen and pelvis was performed using the standard protocol following bolus administration of intravenous contrast. CONTRAST:  115m ISOVUE-300 IOPAMIDOL (ISOVUE-300) INJECTION 61% COMPARISON:  Abdominal CT dated 02/16/2018 FINDINGS: Lower chest: Bibasilar linear atelectasis/scarring. The visualized lung bases are otherwise clear. No intra-abdominal free air or free fluid. Hepatobiliary: No focal liver abnormality is seen. No gallstones, gallbladder wall thickening, or biliary dilatation. Pancreas: Mild diffuse dilatation of the main pancreatic duct similar to prior CT. No mass or pancreatic atrophy. No evidence of active inflammatory changes. Spleen: A 12 mm enhancing focus in the inferior pole of the spleen is not well characterized but likely represents a hemangioma. Adrenals/Urinary Tract: The adrenal glands are unremarkable. There is no hydronephrosis on either side. There is symmetric enhancement and excretion of contrast by both kidneys. Layering calcific density within the bladder likely passed stones or bladder calculi. Stomach/Bowel: There is postsurgical changes of colectomy with a right lower quadrant ileostomy. There is a focal area of tethering and abutment of adjacent loops of small bowel to the anterior pelvic wall and rectus sheath (series 3, image 57) compatible with adhesions.  Additional areas of adhesions noted superiorly (series 3 image 40). There is a focal area of transition in the pelvis (series 3, image 56) with dilatation of the small bowel loops proximally  compatible with obstruction. Additional transition point may be present secondary to adhesions. There is mild thickening and inflammatory changes of the small bowel loops in the lower abdomen and pelvis in the region of the adhesion, most likely reactive. A 4 cm defect in the left anterior abdominal wall with herniation of loops of small bowel appears similar to prior CT. No definite evidence of obstruction at this site. Vascular/Lymphatic: The abdominal aorta and IVC appear unremarkable. There is a retroaortic left renal vein anatomy. No portal venous gas. Multiple enlarged lymph nodes in the right lower quadrant measure up to 14 mm in short axis not significantly changed since the prior CT. Reproductive: Lobulated soft tissue mass extending from the prostate gland posteriorly to the rectum and pelvic sidewall similar to prior CT. Other: Bilateral gluteal nodular lesions measure up to 3.3 x 2.0 cm on the right most consistent with enlarged and abnormal lymph nodes. Musculoskeletal: Sclerotic lesions primarily involving the pelvic bone as well as right femur similar to prior CT consistent with metastatic disease. IMPRESSION: 1. Small-bowel obstruction with transition zone in the right hemipelvis secondary to adhesions. There is mild associated inflammatory changes and thickening of the small bowel loops, likely reactive. 2. Additional nonacute findings as described above and similar to prior CT including left spigelian hernia, prostate mass, right lower quadrant and bilateral gluteal adenopathy, and osseous metastatic disease. Electronically Signed   By: Anner Crete M.D.   On: 03/16/2018 21:48    A/P: 62yo man with h/o crohns and metastatic prostate cancer with recurrent partial obstruction. Fortunately this has resolved  with medical management several times and hopefully this will be the case again. NPO, IVF, monitor ostomy function and abdominal exam. NPO if emesis. Surgery will continue to follow.    Romana Juniper, MD [facility] Surgery, [place] Pager [phone]

## 2018-03-16 NOTE — ED Notes (Signed)
Pt has port.

## 2018-03-16 NOTE — H&P (Signed)
History and Physical    Daniel Howard WJX:[id number] DOB: Aug 19, 1956 DOA: 03/16/2018  PCP: Alycia Rossetti, MD   Patient coming from: Home   Chief Complaint: Abdominal pain, nausea, diarrhea   HPI: Daniel Howard is a 62 y.o. male with medical history significant for prostate cancer with bony metastases followed by urology, Crohn's disease on steroids, now presenting to the emergency department for evaluation of abdominal pain and diarrhea.  Patient was admitted to the hospital 1 month ago with SBO, improved rapidly with conservative management and remained in his usual state until a return in abdominal pain with nausea over the past 1-2 days.  Reports severe pain that is waxing and waning, primarily in the lower quadrants and mid abdomen, associated with nausea but no vomiting.  He denies fevers or chills.  Has had liquid stool in his ileostomy bag.  Reports experiencing similar symptoms last month that led to his admission.  ED Course: Upon arrival to the ED, patient is found to be afebrile, saturating well on room air, and with vitals otherwise normal.  Chemistry panel is unremarkable and CBC is notable for stable normocytic anemia with hemoglobin of 11.8.  CRP is elevated to 2.6 and ESR is elevated to 49.  CT of the abdomen and pelvis is concerning for SBO with transition in the right hemipelvis secondary to adhesions, as well as mild inflammatory changes about the small bowel that are likely reactive.  Also noted on the CT is chronic findings including osseous metastatic disease.  Patient was given a liter of lactated Ringer's, 4 mg IV morphine, Zofran, and Bentyl in the ED.  Surgery was consulted by the ED physician, will evaluate the patient, but recommends a medical admission.  Daniel Howard remains hemodynamically stable, in no apparent respiratory distress, and will be admitted to the medical-surgical unit for ongoing evaluation and management of recurrent SBO.  Review of Systems:  All  other systems reviewed and apart from HPI, are negative.  Past Medical History:  Diagnosis Date  . Abnormal finding of biliary tract    MRCP shows pancreatic/biliary tract dilation. EUS 2010 confirmed dilation but no chronic pancreaitis or mass. Vascular ectasia crimpoing distal CBD.   [name] Anxiety   . 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 Duodenal ulcer 2010   nsaids  . History of blood transfusion 1992   "related to colon OR"  . Peristomal hernia   . Prostate cancer ([place]) 2018  . Small bowel obstruction ([place]) 10/2017; 11/21/2017; 02/16/2018  . Spigelian hernia    bilateral    Past Surgical History:  Procedure Laterality Date  . APPENDECTOMY  1992   at time of exp laparotomy at which time he was noted to have fistulizing Crohn's rather than UC  . COLON SURGERY    . COLONOSCOPY [name] 08/23/2014   [id number] proctoscopy with possible fistulous opening in thebase of rectal/anal stump.    . COLOSTOMY  1992   transverse loop colostomy secondary to a stricture  . ESOPHAGOGASTRODUODENOSCOPY  05/2009   SLF: multiple antral erosions, large ulcer at ansatomosis (postsurgical changes at duodenal bulb and second portion of duodenum) BX c/x NSAIDS.  . EUS  10/04/2009   Dr.  Jacobs-Mildly dilated CBD and main pancreatic duct.  No pancreatic  . EXPLORATORY LAPAROTOMY  1992  . FLEXIBLE SIGMOIDOSCOPY  1988   Dr. Laural Golden- suggested rohn's disease but the biopsies were not collaborative.  [name] FLEXIBLE SIGMOIDOSCOPY [name] 09/08/2016   Procedure: FLEXIBLE SIGMOIDOSCOPY;  Surgeon: Wonda Horner, MD;  Location: [facility] ENDOSCOPY;  Service: Gastroenterology;  Laterality: [name];  .  HEMICOLOECTOMY W/ ANASTOMOSIS  1993   R- Dr.DeMason   . HERNIA REPAIR  1996   incarcerated periostial hernia with additional surgery in 1999  . IR FLUORO GUIDE PORT INSERTION RIGHT  04/03/2017  . IR US GUIDE VASC ACCESS RIGHT  04/03/2017     reports that he quit smoking about 8 years ago. His smoking use included cigarettes. He has a 24.00 pack-year smoking history. He has never used smokeless tobacco. He reports that he does not drink alcohol or use drugs.  Allergies  Allergen Reactions  . Penicillins Hives    Has patient had a PCN reaction causing immediate rash, facial/tongue/throat swelling, SOB or lightheadedness with hypotension: Yes Has patient had a PCN reaction causing severe rash involving mucus membranes or skin necrosis: No Has patient had a PCN reaction that required hospitalization: No Has patient had a PCN reaction occurring within the last 10 years: No If all of the above answers are "NO", then may proceed with Cephalosporin use.     Family History  Problem Relation Age of Onset  . Cancer Father        prostate   . Prostate cancer Father   . Colon cancer Father 74  . Hypertension Sister   . Cancer Sister   . Depression Sister   . Breast cancer Sister   . COPD Sister   . Aneurysm Brother        deceased, brain aneurysm     Prior to Admission medications   Medication Sig Start Date End Date Taking? Authorizing Provider  budesonide (ENTOCORT EC) 3 MG 24 hr capsule Take 3 capsules (9 mg total) by mouth daily. 12/02/17  Yes [place], [name], MD  calcium carbonate (OS-CAL) 600 MG TABS tablet Take 1,200 mg by mouth daily with breakfast.   Yes [provider]  Ferrous Sulfate 28 MG TABS Take 1 tablet by mouth daily.   Yes [provider]  HYDROcodone-acetaminophen (NORCO/VICODIN) 5-325 MG tablet Take 1 tablet by mouth every 6 (six) hours as needed for moderate pain. 02/15/18  Yes [place], [name], MD  lidocaine-prilocaine (EMLA) cream Apply 1  application topically as needed (when accessing port).   Yes [provider]  mirtazapine (REMERON) 15 MG tablet TAKE 1 TABLET BY MOUTH AT BEDTIME Patient taking differently: TAKE 15 mg TABLET BY MOUTH AT BEDTIME 10/14/17  Yes Don[place], [name], MD  Multiple Vitamins-Minerals (CENTRUM SILVER ADULT 50+) TABS Take 1 tablet by mouth daily.   Yes [provider]  docusate sodium (COLACE) 100 MG capsule Take 1 capsule (100 mg total) by mouth 2 (two) times daily. Patient not taking: Reported on 03/16/2018 02/18/18 02/18/19  Reyne Dumas, MD    Physical Exam: Vitals:   03/16/18 1900 03/16/18 1930 03/16/18 2000 03/16/18 2030  BP: 133/82 127/88 133/84 128/83  Pulse: 74 75 84 72  Resp:      Temp:      TempSrc:      SpO2:  98% 100% 98%      Constitutional: NAD, calm, appears uncomfortable  Eyes: PERTLA, lids and conjunctivae normal ENMT: Mucous membranes are moist. Posterior  pharynx clear of any exudate or lesions.   Neck: normal, supple, no masses, no thyromegaly Respiratory: clear to auscultation bilaterally, no wheezing, no crackles. Normal respiratory effort. No accessory muscle use.  Cardiovascular: S1 & S2 heard, regular rate and rhythm. No extremity edema.   Abdomen: No distension, soft, tender in lower quadrants and periumbilically. Tan liquid stool in ileostomy bag. Bowel sounds are active.  Musculoskeletal: no clubbing / cyanosis. No joint deformity upper and lower extremities.  Skin: no significant rashes, lesions, ulcers. Warm, dry, well-perfused. Neurologic: CN 2-12 grossly intact. Sensation intact. Strength 5/5 in all 4 limbs.  Psychiatric: Alert and oriented x 3. Calm, cooperative.     Labs on Admission: I have personally reviewed following labs and imaging studies  CBC: Recent Labs  Lab 03/16/18 1117  WBC 8.4  HGB 11.8*  HCT 36.0*  MCV 97.6  PLT 092   Basic Metabolic Panel: Recent Labs  Lab 03/16/18 1117  NA 138  K 4.2  CL 104  CO2 26    GLUCOSE 103*  BUN 14  CREATININE 1.23  CALCIUM 9.2   GFR: CrCl cannot be calculated (Unknown ideal weight.). Liver Function Tests: Recent Labs  Lab 03/16/18 1117  AST 24  ALT 17  ALKPHOS 53  BILITOT 1.2  PROT 7.6  ALBUMIN 3.3*   Recent Labs  Lab 03/16/18 1117  LIPASE 22   No results for input(s): AMMONIA in the last 168 hours. Coagulation Profile: No results for input(s): INR, PROTIME in the last 168 hours. Cardiac Enzymes: No results for input(s): CKTOTAL, CKMB, CKMBINDEX, TROPONINI in the last 168 hours. BNP (last 3 results) No results for input(s): PROBNP in the last 8760 hours. HbA1C: No results for input(s): HGBA1C in the last 72 hours. CBG: No results for input(s): GLUCAP in the last 168 hours. Lipid Profile: No results for input(s): CHOL, HDL, LDLCALC, TRIG, CHOLHDL, LDLDIRECT in the last 72 hours. Thyroid Function Tests: No results for input(s): TSH, T4TOTAL, FREET4, T3FREE, THYROIDAB in the last 72 hours. Anemia Panel: No results for input(s): VITAMINB12, FOLATE, FERRITIN, TIBC, IRON, RETICCTPCT in the last 72 hours. Urine analysis:    Component Value Date/Time   COLORURINE YELLOW 02/16/2018 1935   APPEARANCEUR CLEAR 02/16/2018 1935   LABSPEC 1.013 02/16/2018 1935   PHURINE 5.0 02/16/2018 1935   GLUCOSEU NEGATIVE 02/16/2018 [place] NEGATIVE 02/16/2018 [place] NEGATIVE 02/16/2018 1935   KETONESUR 20 (A) 02/16/2018 1935   PROTEINUR NEGATIVE 02/16/2018 1935   UROBILINOGEN 0.2 10/10/2014 1037   NITRITE NEGATIVE 02/16/2018 1935   LEUKOCYTESUR NEGATIVE 02/16/2018 1935   Sepsis Labs: @LABRCNTIP (procalcitonin:4,lacticidven:4) )No results found for this or any previous visit (from the past 240 hour(s)).   Radiological Exams on Admission: Ct Abdomen Pelvis W Contrast  Result Date: 03/16/2018 CLINICAL DATA:  62 year old male with abdominal pain. Concern for gastroenteritis or colitis. History of Crohn's disease and hemicolectomy and  colostomy. EXAM: CT ABDOMEN AND PELVIS WITH CONTRAST TECHNIQUE: Multidetector CT imaging of the abdomen and pelvis was performed using the standard protocol following bolus administration of intravenous contrast. CONTRAST:  148m ISOVUE-300 IOPAMIDOL (ISOVUE-300) INJECTION 61% COMPARISON:  Abdominal CT dated 02/16/2018 FINDINGS: Lower chest: Bibasilar linear atelectasis/scarring. The visualized lung bases are otherwise clear. No intra-abdominal free air or free fluid. Hepatobiliary: No focal liver abnormality is seen. No gallstones, gallbladder wall thickening, or biliary dilatation. Pancreas: Mild diffuse dilatation of the main pancreatic duct similar to prior CT. No mass or pancreatic atrophy. No evidence of active inflammatory  changes. Spleen: A 12 mm enhancing focus in the inferior pole of the spleen is not well characterized but likely represents a hemangioma. Adrenals/Urinary Tract: The adrenal glands are unremarkable. There is no hydronephrosis on either side. There is symmetric enhancement and excretion of contrast by both kidneys. Layering calcific density within the bladder likely passed stones or bladder calculi. Stomach/Bowel: There is postsurgical changes of colectomy with a right lower quadrant ileostomy. There is a focal area of tethering and abutment of adjacent loops of small bowel to the anterior pelvic wall and rectus sheath (series 3, image 57) compatible with adhesions. Additional areas of adhesions noted superiorly (series 3 image 40). There is a focal area of transition in the pelvis (series 3, image 56) with dilatation of the small bowel loops proximally compatible with obstruction. Additional transition point may be present secondary to adhesions. There is mild thickening and inflammatory changes of the small bowel loops in the lower abdomen and pelvis in the region of the adhesion, most likely reactive. A 4 cm defect in the left anterior abdominal wall with herniation of loops of small  bowel appears similar to prior CT. No definite evidence of obstruction at this site. Vascular/Lymphatic: The abdominal aorta and IVC appear unremarkable. There is a retroaortic left renal vein anatomy. No portal venous gas. Multiple enlarged lymph nodes in the right lower quadrant measure up to 14 mm in short axis not significantly changed since the prior CT. Reproductive: Lobulated soft tissue mass extending from the prostate gland posteriorly to the rectum and pelvic sidewall similar to prior CT. Other: Bilateral gluteal nodular lesions measure up to 3.3 x 2.0 cm on the right most consistent with enlarged and abnormal lymph nodes. Musculoskeletal: Sclerotic lesions primarily involving the pelvic bone as well as right femur similar to prior CT consistent with metastatic disease. IMPRESSION: 1. Small-bowel obstruction with transition zone in the right hemipelvis secondary to adhesions. There is mild associated inflammatory changes and thickening of the small bowel loops, likely reactive. 2. Additional nonacute findings as described above and similar to prior CT including left spigelian hernia, prostate mass, right lower quadrant and bilateral gluteal adenopathy, and osseous metastatic disease. Electronically Signed   By: Anner Crete M.D.   On: 03/16/2018 21:48    EKG: Not performed.   Assessment/Plan   1. SBO  - Presents with abdominal pain, nausea, and watery stool in ileostomy bag  - CT findings concerning for SBO secondary to adhesions  - Treated in ED with 1 liter LR, morphine, and Zofran  - Surgery is consulting and much appreciated  - Continue conservative management with bowel-rest, IVF hydration, analgesia and antiemetics; serial exams    2. Chron's disease  - Managed with Entocort  - CT abd/pelvis with SBO, no abscess or fistula noted    3. Prostate cancer  - Follows with urology  - CT abd/pelvis with stable metastatic disease  - Continue urology follow-up    DVT prophylaxis:  SCD's  Code Status: Full  Family Communication: Discussed with patient Consults called: Surgery Admission status: Inpatient    Vianne Bulls, MD [facility] Pager [phone]  If 7PM-7AM, please contact night-coverage [email or web address] Password [facility]  03/16/2018, 11:17 PM

## 2018-03-16 NOTE — ED Triage Notes (Signed)
Pt coming from home with complaint of abdominal cramping and diarrhea. Pt has hx of Crohns disease. Pt states belching as well.

## 2018-03-17 ENCOUNTER — Inpatient Hospital Stay (HOSPITAL_COMMUNITY): Payer: Medicare Other

## 2018-03-17 LAB — BASIC METABOLIC PANEL
Anion gap: 8 (ref 5–15)
BUN: 11 mg/dL (ref 6–20)
CALCIUM: 8.6 mg/dL — AB (ref 8.9–10.3)
CO2: 24 mmol/L (ref 22–32)
CREATININE: 1.14 mg/dL (ref 0.61–1.24)
Chloride: 106 mmol/L (ref 101–111)
GFR calc Af Amer: >60 mL/min (ref >60)
GFR calc non Af Amer: >60 mL/min (ref >60)
GLUCOSE: 74 mg/dL (ref 65–99)
Potassium: 3.7 mmol/L (ref 3.5–5.1)
Sodium: 138 mmol/L (ref 135–145)

## 2018-03-17 LAB — CBC
HCT: 31.8 % — ABNORMAL LOW (ref 39.0–52.0)
HEMOGLOBIN: 10.1 g/dL — AB (ref 13.0–17.0)
MCH: 30.7 pg (ref 26.0–34.0)
MCHC: 31.8 g/dL (ref 30.0–36.0)
MCV: 96.7 fL (ref 78.0–100.0)
PLATELETS: 264 10*3/uL (ref 150–400)
RBC: 3.29 MIL/uL — ABNORMAL LOW (ref 4.22–5.81)
RDW: 13.4 % (ref 11.5–15.5)
WBC: 5.6 10*3/uL (ref 4.0–10.5)

## 2018-03-17 LAB — GLUCOSE, CAPILLARY
GLUCOSE-CAPILLARY: 35 mg/dL — AB (ref 65–99)
Glucose-Capillary: 93 mg/dL (ref 65–99)

## 2018-03-17 MED ORDER — KCL IN DEXTROSE-NACL 20-5-0.45 MEQ/L-%-% IV SOLN
INTRAVENOUS | Status: DC
  Administered 2018-03-17 – 2018-03-19 (×4): via INTRAVENOUS
  Filled 2018-03-17 (×4): qty 1000

## 2018-03-17 MED ORDER — SODIUM CHLORIDE 0.9% FLUSH
10.0000 mL | Freq: Two times a day (BID) | INTRAVENOUS | Status: DC
  Administered 2018-03-18 – 2018-03-19 (×2): 10 mL

## 2018-03-17 MED ORDER — SODIUM CHLORIDE 0.9% FLUSH
10.0000 mL | INTRAVENOUS | Status: DC | PRN
  Administered 2018-03-17: 20 mL
  Filled 2018-03-17: qty 40

## 2018-03-17 MED ORDER — DEXTROSE 50 % IV SOLN
INTRAVENOUS | Status: AC
  Administered 2018-03-17: 25 mL
  Filled 2018-03-17: qty 50

## 2018-03-17 NOTE — Progress Notes (Signed)
CRITICAL VALUE ALERT  Critical Value: CBG 35  Date & Time Notied: 03/17/2018 10:30  Provider Notified: Dr. Jonnie Finner  Orders Received/Actions taken: Activated Hypoglycemia order, Pt given D50 - 38ms at 10:30 AM

## 2018-03-17 NOTE — Progress Notes (Signed)
Pt. transported from ER via stretcher to bed 6N-24; alert and oriented x4; oriented to room and call button.

## 2018-03-17 NOTE — ED Provider Notes (Signed)
[facility]  SURGICAL Provider Note   CSN: [id number] Arrival date & time: 03/16/18  1035     History   Chief Complaint Chief Complaint  Patient presents with  . Abdominal Pain  . Diarrhea    HPI HRISTOPHER MISSILDINE is a 62 y.o. male.  69-year-old male with extensive past medical history including Crohn's disease, multiple abdominal surgeries, colostomy, prostate cancer who presents with abdominal pain and diarrhea.  Patient states that he began having intermittent crampy central abdominal pain associated with nonbloody diarrhea yesterday.  He states that the cramping occurs just before bowel movements and is not associated with any nausea or vomiting.  No fevers or urinary symptoms.  He denies any sick contacts, fevers, or recent travel.  He has been compliant with his medications.  The history is provided by the patient.  Abdominal Pain   Associated symptoms include diarrhea.  Diarrhea   Associated symptoms include abdominal pain.    Past Medical History:  Diagnosis Date  . Abnormal finding of biliary tract    MRCP shows pancreatic/biliary tract dilation. EUS 2010 confirmed dilation but no chronic pancreaitis or mass. Vascular ectasia crimpoing distal CBD.   [name] Anxiety   . 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 Duodenal ulcer 2010   nsaids  . History of blood transfusion 1992   "related to colon OR"  . Peristomal hernia   . Prostate cancer ([place]) 2018  . Small bowel obstruction ([place]) 10/2017; 11/21/2017; 02/16/2018  . Spigelian hernia    bilateral    Patient Active Problem List   Diagnosis Date Noted  .  Nausea with vomiting 11/10/2017  . Spigelian hernia 09/22/2017  . Abdominal pain of multiple sites 09/22/2017  . Goals of care, counseling/discussion 04/02/2017  . Prostate cancer ([place]) 04/02/2017  . Elevated PSA 03/04/2017  . Osteopenia 11/03/2016  . Pelvic mass in male 09/04/2016  . 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 Exacerbation of Crohn's disease ([place]) 09/04/2016  . Protein-calorie malnutrition, severe ([place]) 07/27/2014  . SBO (small bowel obstruction) ([place]) 07/26/2014  . Loss of weight 06/09/2014  . Crohn's disease of both small and large intestine with complication ([place]) 82/50/0370  . Boils 02/11/2013  . Ventral hernia 12/11/2009  . Anemia 12/03/2009  . Regional enteritis/Crohn's 01/25/2007    Past Surgical History:  Procedure Laterality Date  . APPENDECTOMY  1992   at time of exp laparotomy at which time he was noted to have fistulizing Crohn's rather than UC  . COLON SURGERY    . COLONOSCOPY [name] 08/23/2014   [id number] proctoscopy with possible fistulous opening in thebase of rectal/anal stump.    . COLOSTOMY  1992   transverse loop colostomy secondary to a stricture  . ESOPHAGOGASTRODUODENOSCOPY  05/2009   SLF: multiple antral erosions, large ulcer at ansatomosis (postsurgical changes at duodenal bulb and second portion of duodenum) BX c/x NSAIDS.  . EUS  10/04/2009   Dr. Estill Bakes dilated CBD and main pancreatic duct.  No pancreatic  . EXPLORATORY LAPAROTOMY  1992  . FLEXIBLE SIGMOIDOSCOPY  1988   Dr. Laural Golden- suggested rohn's disease but the biopsies were not collaborative.  [name] FLEXIBLE SIGMOIDOSCOPY [name] 09/08/2016   Procedure: FLEXIBLE  SIGMOIDOSCOPY;  Surgeon: Wonda Horner, MD;  Location: [facility] ENDOSCOPY;  Service: Gastroenterology;  Laterality: [name];  . HEMICOLOECTOMY W/ ANASTOMOSIS  1993   R- Dr.DeMason   . HERNIA REPAIR  1996   incarcerated periostial hernia with additional surgery in 1999  . IR FLUORO GUIDE PORT INSERTION RIGHT  04/03/2017  . IR US GUIDE VASC ACCESS  RIGHT  04/03/2017        Home Medications    Prior to Admission medications   Medication Sig Start Date End Date Taking? Authorizing Provider  budesonide (ENTOCORT EC) 3 MG 24 hr capsule Take 3 capsules (9 mg total) by mouth daily. 12/02/17  Yes [place], [name], MD  calcium carbonate (OS-CAL) 600 MG TABS tablet Take 1,200 mg by mouth daily with breakfast.   Yes [provider]  Ferrous Sulfate 28 MG TABS Take 1 tablet by mouth daily.   Yes [provider]  HYDROcodone-acetaminophen (NORCO/VICODIN) 5-325 MG tablet Take 1 tablet by mouth every 6 (six) hours as needed for moderate pain. 02/15/18  Yes [place], [name], MD  lidocaine-prilocaine (EMLA) cream Apply 1 application topically as needed (when accessing port).   Yes [provider]  mirtazapine (REMERON) 15 MG tablet TAKE 1 TABLET BY MOUTH AT BEDTIME Patient taking differently: TAKE 15 mg TABLET BY MOUTH AT BEDTIME 10/14/17  Yes [place], [name], MD  Multiple Vitamins-Minerals (CENTRUM SILVER ADULT 50+) TABS Take 1 tablet by mouth daily.   Yes [provider]  docusate sodium (COLACE) 100 MG capsule Take 1 capsule (100 mg total) by mouth 2 (two) times daily. Patient not taking: Reported on 03/16/2018 02/18/18 02/18/19  Reyne Dumas, MD    Family History Family History  Problem Relation Age of Onset  . Cancer Father        prostate   . Prostate cancer Father   . Colon cancer Father 37  . Hypertension Sister   . Cancer Sister   . Depression Sister   . Breast cancer Sister   . COPD Sister   . Aneurysm Brother        deceased, brain aneurysm    Social History Social History   Tobacco Use  . Smoking status: Former Smoker    Packs/day: 0.75    Years: 32.00    Pack years: 24.00    Types: Cigarettes    Last attempt to quit: 12/21/2009    Years since quitting: 8.2  . Smokeless tobacco: Never Used  Substance Use Topics  . Alcohol use: No    Alcohol/week: 0.0 oz    Comment: Former  drinker  . Drug use: No     Allergies   Penicillins   Review of Systems Review of Systems  Gastrointestinal: Positive for abdominal pain and diarrhea.   All other systems reviewed and are negative except that which was mentioned in HPI   Physical Exam Updated Vital Signs BP 126/76 (BP Location: Right Arm)   Pulse 78   Temp 98.7 F (37.1 C) (Oral)   Resp 17   SpO2 98%   Physical Exam  Constitutional: He is oriented to person, place, and time. He appears well-developed and well-nourished. No distress.  HENT:  Head: Normocephalic and atraumatic.  Eyes: Conjunctivae are normal.  Neck: Neck supple.  Cardiovascular: Normal rate, regular rhythm and normal heart sounds.  No murmur heard. Pulmonary/Chest: Effort normal and breath sounds normal.  Abdominal: Soft. Bowel sounds are normal. There is tenderness (central). There is no rebound and no guarding.  Mild abdominal distension with central tenderness; R sided ostomy with liquid brown stool; healed large central abdominal surgical scar  Musculoskeletal: He exhibits no edema.  Neurological: He is alert and oriented to person, place, and time.  Fluent speech  Skin: Skin is warm and dry.  Psychiatric: He has a normal mood and affect. Judgment normal.  Nursing note and vitals reviewed.    ED Treatments / Results  Labs (all labs ordered are listed, but only abnormal results are displayed) Labs Reviewed  COMPREHENSIVE METABOLIC PANEL - Abnormal; Notable for the following components:      Result Value   Glucose, Bld 103 (*)    Albumin 3.3 (*)    All other components within normal limits  CBC - Abnormal; Notable for the following components:   RBC 3.69 (*)    Hemoglobin 11.8 (*)    HCT 36.0 (*)    All other components within normal limits  SEDIMENTATION RATE - Abnormal; Notable for the following components:   Sed Rate 49 (*)    All other components within normal limits  C-REACTIVE PROTEIN - Abnormal; Notable for the  following components:   CRP 2.6 (*)    All other components within normal limits  LIPASE, BLOOD  BASIC METABOLIC PANEL  CBC    EKG None  Radiology Ct Abdomen Pelvis W Contrast  Result Date: 03/16/2018 CLINICAL DATA:  62 year old male with abdominal pain. Concern for gastroenteritis or colitis. History of Crohn's disease and hemicolectomy and colostomy. EXAM: CT ABDOMEN AND PELVIS WITH CONTRAST TECHNIQUE: Multidetector CT imaging of the abdomen and pelvis was performed using the standard protocol following bolus administration of intravenous contrast. CONTRAST:  152m ISOVUE-300 IOPAMIDOL (ISOVUE-300) INJECTION 61% COMPARISON:  Abdominal CT dated 02/16/2018 FINDINGS: Lower chest: Bibasilar linear atelectasis/scarring. The visualized lung bases are otherwise clear. No intra-abdominal free air or free fluid. Hepatobiliary: No focal liver abnormality is seen. No gallstones, gallbladder wall thickening, or biliary dilatation. Pancreas: Mild diffuse dilatation of the main pancreatic duct similar to prior CT. No mass or pancreatic atrophy. No evidence of active inflammatory changes. Spleen: A 12 mm enhancing focus in the inferior pole of the spleen is not well characterized but likely represents a hemangioma. Adrenals/Urinary Tract: The adrenal glands are unremarkable. There is no hydronephrosis on either side. There is symmetric enhancement and excretion of contrast by both kidneys. Layering calcific density within the bladder likely passed stones or bladder calculi. Stomach/Bowel: There is postsurgical changes of colectomy with a right lower quadrant ileostomy. There is a focal area of tethering and abutment of adjacent loops of small bowel to the anterior pelvic wall and rectus sheath (series 3, image 57) compatible with adhesions. Additional areas of adhesions noted superiorly (series 3 image 40). There is a focal area of transition in the pelvis (series 3, image 56) with dilatation of the small bowel  loops proximally compatible with obstruction. Additional transition point may be present secondary to adhesions. There is mild thickening and inflammatory changes of the small bowel loops in the lower abdomen and pelvis in the region of the adhesion, most likely reactive. A 4 cm defect in the left anterior abdominal wall with herniation of loops of small bowel appears similar to prior CT. No definite evidence of obstruction at this site. Vascular/Lymphatic: The abdominal aorta and IVC appear unremarkable. There is a retroaortic left renal vein anatomy. No portal venous gas. Multiple enlarged lymph nodes in the right lower quadrant measure up to 14 mm in short axis not significantly  changed since the prior CT. Reproductive: Lobulated soft tissue mass extending from the prostate gland posteriorly to the rectum and pelvic sidewall similar to prior CT. Other: Bilateral gluteal nodular lesions measure up to 3.3 x 2.0 cm on the right most consistent with enlarged and abnormal lymph nodes. Musculoskeletal: Sclerotic lesions primarily involving the pelvic bone as well as right femur similar to prior CT consistent with metastatic disease. IMPRESSION: 1. Small-bowel obstruction with transition zone in the right hemipelvis secondary to adhesions. There is mild associated inflammatory changes and thickening of the small bowel loops, likely reactive. 2. Additional nonacute findings as described above and similar to prior CT including left spigelian hernia, prostate mass, right lower quadrant and bilateral gluteal adenopathy, and osseous metastatic disease. Electronically Signed   By: Anner Crete M.D.   On: 03/16/2018 21:48    Procedures Procedures (including critical care time)  Medications Ordered in ED Medications  budesonide (ENTOCORT EC) 24 hr capsule 9 mg (has no administration in time range)  0.9 %  sodium chloride infusion ( Intravenous New Bag/Given 03/17/18 0048)  acetaminophen (TYLENOL) tablet 650 mg (has  no administration in time range)    Or  acetaminophen (TYLENOL) suppository 650 mg (has no administration in time range)  ondansetron (ZOFRAN) tablet 4 mg (has no administration in time range)    Or  ondansetron (ZOFRAN) injection 4 mg (has no administration in time range)  morphine 4 MG/ML injection 2-4 mg (4 mg Intravenous Given 03/17/18 0048)  lactated ringers bolus 1,000 mL (0 mLs Intravenous Stopped 03/16/18 2153)  dicyclomine (BENTYL) capsule 10 mg (10 mg Oral Given 03/16/18 1958)  morphine 4 MG/ML injection 4 mg (4 mg Intravenous Given 03/16/18 1958)  ondansetron (ZOFRAN) injection 4 mg (4 mg Intravenous Given 03/16/18 1958)  iopamidol (ISOVUE-300) 61 % injection (100 mLs  Contrast Given 03/16/18 2102)     Initial Impression / Assessment and Plan / ED Course  I have reviewed the triage vital signs and the nursing notes.  Pertinent labs & imaging results that were available during my care of the patient were reviewed by me and considered in my medical decision making (see chart for details).     Pt non-toxic on exam w/ reassuring VS, afebrile.  Central abdominal tenderness with mild distention but no rebound or guarding and no peritonitis.  CMP and CBC reassuring.  Inflammatory markers are elevated.  Obtain CT because of his extensive bowel obstruction history.  CT shows SBO secondary to adhesions with some inflammatory changes.  Discussed with general surgery, Dr. Windle Guard, who has recommended medical treatment as patient is very high risk for complications with any surgical intervention. She agrees with plan to hold off on NGT for now.  Discussed admission with [facility], Dr. Myna Hidalgo.   Final Clinical Impressions(s) / ED Diagnoses   Final diagnoses:  None    ED Discharge Orders    None       Calandra Madura, Wenda Overland, MD 03/17/18 [phone]

## 2018-03-17 NOTE — Progress Notes (Signed)
PCP:  Vic Blackbird, MD GI:  Manus Rudd      CC: Abdominal cramping and diarrhea  Subjective: Patient continues to have issues with abdominal cramping and diarrhea.  He is putting out a milky brown colored liquid through his right-sided colostomy.  Objective: Vital signs in last 24 hours: Temp:  [98.7 F (37.1 C)-98.9 F (37.2 C)] 98.9 F (37.2 C) (03/27 0300) Pulse Rate:  [72-99] 72 (03/27 0300) Resp:  [15-18] 17 (03/27 0300) BP: (106-137)/(56-88) 107/56 (03/27 0300) SpO2:  [95 %-100 %] 98 % (03/27 0300) Weight:  [67.8 kg (149 lb 7.6 oz)] 67.8 kg (149 lb 7.6 oz) (03/27 0300)   1000 IV Afebrile, VSS Labs OK; creatinine 1.14 WBC 5.6 H/H 10.1/31.8 SED rate 49 CT 3/26:  postsurgical changes of colectomy with a right lower quadrant ileostomy. There is a focal area of tethering and abutment of adjacent loops of small bowel to the anterior pelvic wall and rectus sheath (series 3, image 57) compatible with adhesions. Additional areas of adhesions noted superiorly (series 3 image 40). There is a focal area of transition in the pelvis (series 3, image 56) with dilatation of the small bowel loops proximally compatible with obstruction. Additional transition point may be present secondary to adhesions. There is mild thickening and inflammatory changes of the small bowel loops in the lower abdomen and pelvis in the region of the adhesion, most likely reactive. A 4 cm defect in the left anterior abdominal wall with herniation of loops of small bowel appears similar to prior CT. No definite evidence of obstruction at this site.  Intake/Output from previous day: 03/26 0701 - 03/27 0700 In: 1000 [IV Piggyback:1000] Out: -  Intake/Output this shift: No intake/output data recorded.  General appearance: alert, cooperative and no distress Resp: clear to auscultation bilaterally GI: Abdomen slightly distended.  He has multiple hernias.  Dense midline surgical scar, prior colostomy scar on  the left, large volume of light brown-white milky colored diarrhea coming from his colostomy.  Lab Results:  Recent Labs    03/16/18 1117 03/17/18 0411  WBC 8.4 5.6  HGB 11.8* 10.1*  HCT 36.0* 31.8*  PLT 306 264    BMET Recent Labs    03/16/18 1117 03/17/18 0411  NA 138 138  K 4.2 3.7  CL 104 106  CO2 26 24  GLUCOSE 103* 74  BUN 14 11  CREATININE 1.23 1.14  CALCIUM 9.2 8.6*   PT/INR No results for input(s): LABPROT, INR in the last 72 hours.  Recent Labs  Lab 03/16/18 1117  AST 24  ALT 17  ALKPHOS 53  BILITOT 1.2  PROT 7.6  ALBUMIN 3.3*     Lipase     Component Value Date/Time   LIPASE 22 03/16/2018 1117     Prior to Admission medications   Medication Sig Start Date End Date Taking? Authorizing Provider  budesonide (ENTOCORT EC) 3 MG 24 hr capsule Take 3 capsules (9 mg total) by mouth daily. 12/02/17  Yes [place], [name], MD  calcium carbonate (OS-CAL) 600 MG TABS tablet Take 1,200 mg by mouth daily with breakfast.   Yes [provider]  Ferrous Sulfate 28 MG TABS Take 1 tablet by mouth daily.   Yes [provider]  HYDROcodone-acetaminophen (NORCO/VICODIN) 5-325 MG tablet Take 1 tablet by mouth every 6 (six) hours as needed for moderate pain. 02/15/18  Yes [place], [name], MD  lidocaine-prilocaine (EMLA) cream Apply 1 application topically as needed (when accessing port).   Yes  [provider]  mirtazapine (REMERON) 15 MG tablet TAKE 1 TABLET BY MOUTH AT BEDTIME Patient taking differently: TAKE 15 mg TABLET BY MOUTH AT BEDTIME 10/14/17  Yes [place], [name], MD  Multiple Vitamins-Minerals (CENTRUM SILVER ADULT 50+) TABS Take 1 tablet by mouth daily.   Yes [provider]  docusate sodium (COLACE) 100 MG capsule Take 1 capsule (100 mg total) by mouth 2 (two) times daily. Patient not taking: Reported on 03/16/2018 02/18/18 02/18/19  Reyne Dumas, MD    Medications: . budesonide  9 mg Oral Daily  . sodium chloride  flush  10-40 mL Intracatheter Q12H   . sodium chloride 110 mL/hr at 03/17/18 0048   Anti-infectives (From admission, onward)   None        Assessment/Plan Metastatic prostate cancer Mild anemia   Partial small bowel obstruction/history of Crohn's since age 62 Multiple prior surgeries/partial colectomies/ileostomy right-sided colostomy -Dr.  Anthony Sar  FEN: IV fluids/NPO ID:  None DVT:  SCD Foley: None Follow up:  TBD  Plan: Continue IV fluid hydration.  Film is pending.  Mobilize.  He is on Entocort EC at home for his Crohn's. Will check on continuing it here.  LOS: 1 day    Jamilah Jean 03/17/2018 [phone]

## 2018-03-17 NOTE — Progress Notes (Signed)
[facility] Progress Note  Subjective: doing better today, wants to try food and /or drink, nausea better  Vitals:   03/16/18 2230 03/17/18 0021 03/17/18 0300 03/17/18 1321  BP: 113/63 126/76 (!) 107/56 110/69  Pulse: 79 78 72 73  Resp:  17 17 17   Temp:  98.7 F (37.1 C) 98.9 F (37.2 C) 98.4 F (36.9 C)  TempSrc:  Oral Oral Oral  SpO2: 96% 98% 98% 99%  Weight:   67.8 kg (149 lb 7.6 oz)   Height:   5' 2"  (1.575 m)     Inpatient medications: . budesonide  9 mg Oral Daily  . sodium chloride flush  10-40 mL Intracatheter Q12H   . dextrose 5 % and 0.45 % NaCl with KCl 20 mEq/L 75 mL/hr at 03/17/18 1326   acetaminophen **OR** acetaminophen, morphine injection, ondansetron **OR** ondansetron (ZOFRAN) IV, sodium chloride flush  Exam:  Constitutional: NAD, calm, and pleasant Eyes: PERTLA  Neck: normal, supple, no masses, no thyromegaly Respiratory: clear to auscultation bilaterally, no wheezing, no crackles Cardiovascular: S1 & S2 heard, regular rate and rhythm. No extremity edema.   Abdomen: No distension, soft, minimally tender in lower quadrants and periumbilically. Tan liquid stool in ileostomy bag. Bowel sounds are active.  Musculoskeletal: no clubbing / cyanosis. No joint deformity upper and lower extremities.  Skin: no significant rashes, lesions, ulcers. Warm, dry, well-perfused. Neurologic: CN 2-12 grossly intact. Sensation intact. Strength 5/5 in all 4 limbs.  Psychiatric: Alert and oriented x 3. Calm, cooperative.      Brief Summary:  62 y.o. male w/ PMH sig for prostate Ca w/ bony mets (f/b urology), Crohn's dz long-standing (on budesonide) w/ ileostomy bag and multiople prior surgeries, presented to ED with c/o abd pain and diarrhea.  Has been admitted 1 mos ago with dx of SBO which improved rapidly w/ conservative Rx.  Pain has been waxing/ waning, nausea+, pain is bilat Lower quadrants.  No fever , chills or bloody stool.   ED Course: Upon arrival to the  ED, pt was afebrile, normal RA sat, VS stable/ normal.  Chem panel ok and WBC showed Hb 11.8, normal WBC. CRP ^ at 2.6, ESRD 49. CT abd/ pelvis was concerning for SBO with transition in the right hemipelvis secondary to adhesions, as well as mild inflammatory changes about the small bowel that are likely reactive.  Also noted on the CT is chronic findings including osseous metastatic disease.  Patient rec'd lactated Ringer's, 4 mg IV morphine, Zofran, and Bentyl in the ED.  Surgery was consulted by the ED physician.  Pt was admitted to the medical-surgical unit for ongoing evaluation and management of recurrent SBO.   Impression/ Plan:  1. SBO : gen surgery consulted, recommended conservative care w bowel-rest , hydration , analgesia, antiemetics.  Plain film abd xrays showed sig improvement today compared w CT findings from yest.  - doing better today, passing flatus, wants to eat, nausea resolved; will start on clears, advance as tol - appreciate gen surg assistance   2. Chron's disease  - Managed with Entocort  - CT abd/pelvis with SBO, no abscess or fistula noted    3. Prostate cancer  - Follows with urology  - CT abd/pelvis with stable metastatic disease  - Continue urology follow-up    DVT prophylaxis: SCD's  Code Status: Full  Family Communication: Discussed with patient Consults called: Surgery Admission status: Inpatient  Dispo: possible dc 24 hrs if continues to improve  Kelly Splinter MD [facility]  Group pgr [phone] 03/17/2018, 3:57 PM   Consults: - gen surgery 3/26  Recent Labs  Lab 03/16/18 1117 03/17/18 0411  NA 138 138  K 4.2 3.7  CL 104 106  CO2 26 24  GLUCOSE 103* 74  BUN 14 11  CREATININE 1.23 1.14  CALCIUM 9.2 8.6*   Recent Labs  Lab 03/16/18 1117  AST 24  ALT 17  ALKPHOS 53  BILITOT 1.2  PROT 7.6  ALBUMIN 3.3*   Recent Labs  Lab 03/16/18 1117 03/17/18 0411  WBC 8.4 5.6  HGB 11.8* 10.1*  HCT 36.0* 31.8*  MCV 97.6 96.7   PLT 306 264   Iron/TIBC/Ferritin/ %Sat    Component Value Date/Time   IRON 65 03/28/2014 1130   TIBC 357 01/07/2010 1814   FERRITIN 222 03/28/2014 1130   IRONPCTSAT 10 (L) 01/07/2010 1814

## 2018-03-17 NOTE — Plan of Care (Signed)
  Problem: Education: Goal: Knowledge of General Education information will improve Outcome: Progressing Note:  POC and orders reviewed with pt.

## 2018-03-18 ENCOUNTER — Inpatient Hospital Stay (HOSPITAL_COMMUNITY): Payer: Medicare Other

## 2018-03-18 DIAGNOSIS — K5000 Crohn's disease of small intestine without complications: Secondary | ICD-10-CM

## 2018-03-18 LAB — GLUCOSE, CAPILLARY: Glucose-Capillary: 89 mg/dL (ref 65–99)

## 2018-03-18 LAB — CBC
HCT: 31 % — ABNORMAL LOW (ref 39.0–52.0)
Hemoglobin: 10.3 g/dL — ABNORMAL LOW (ref 13.0–17.0)
MCH: 31.6 pg (ref 26.0–34.0)
MCHC: 33.2 g/dL (ref 30.0–36.0)
MCV: 95.1 fL (ref 78.0–100.0)
PLATELETS: 259 10*3/uL (ref 150–400)
RBC: 3.26 MIL/uL — AB (ref 4.22–5.81)
RDW: 13.1 % (ref 11.5–15.5)
WBC: 4.5 10*3/uL (ref 4.0–10.5)

## 2018-03-18 LAB — BASIC METABOLIC PANEL
Anion gap: 8 (ref 5–15)
BUN: 6 mg/dL (ref 6–20)
CO2: 24 mmol/L (ref 22–32)
Calcium: 8.6 mg/dL — ABNORMAL LOW (ref 8.9–10.3)
Chloride: 106 mmol/L (ref 101–111)
Creatinine, Ser: 1.09 mg/dL (ref 0.61–1.24)
GFR calc Af Amer: >60 mL/min (ref >60)
GLUCOSE: 99 mg/dL (ref 65–99)
POTASSIUM: 3.9 mmol/L (ref 3.5–5.1)
Sodium: 138 mmol/L (ref 135–145)

## 2018-03-18 MED ORDER — ENOXAPARIN SODIUM 40 MG/0.4ML ~~LOC~~ SOLN
40.0000 mg | SUBCUTANEOUS | Status: DC
  Filled 2018-03-18: qty 0.4

## 2018-03-18 NOTE — Progress Notes (Signed)
PROGRESS NOTE    Daniel Howard  PXT:[id number] DOB: June 11, 1956 DOA: 03/16/2018 PCP: Alycia Rossetti, MD  Brief Narrative:Daniel Howard is a 62 y.o. male with medical history significant for prostate cancer with bony metastases followed by urology, Crohn's disease on steroids, now presenting to the emergency department for evaluation of abdominal pain and diarrhea CT of the abdomen and pelvis is concerning for SBO with transition in the right hemipelvis secondary to adhesions, as well as mild inflammatory changes about the small bowel that are likely reactive. -improving with conservative management  Assessment & Plan:   Principal Problem:   SBO (small bowel obstruction) (HCC) -Likely secondary to adhesions, versus Crohn's - general surgery following -Improving with conservative management, bowel rest hydration -Tolerating full liquids, to be advanced to soft diet this evening   Crohn's disease -On Entocort at baseline -CT did not show any abscess or fistula -Management as above, follow-up with GI    Metastatic prostate cancer -Continue follow-up with urology  andohn's disease of both small and large intestine with complication (HCC)   Prostate cancer ([place])   Mild chronic anemia -slightly worsened by dilution -Monitor  DVT prophylaxis:and Lovenox Code Status: full code Family Communication:per family at bedside Disposition Plan: , likely tomorrow pending diet advancement  Consultants:   General surgery   Procedures:   Antimicrobials:    Subjective: -feels better, having colostomy output -Was able to tolerate some of his liquids  Objective: Vitals:   03/17/18 0300 03/17/18 1321 03/17/18 2219 03/18/18 0539  BP: (!) 107/56 110/69 123/75 111/71  Pulse: 72 73 81 64  Resp: 17 17 17 17   Temp: 98.9 F (37.2 C) 98.4 F (36.9 C) 98.9 F (37.2 C) 98.1 F (36.7 C)  TempSrc: Oral Oral Oral Oral  SpO2: 98% 99% 99% 99%  Weight: 67.8 kg (149 lb 7.6 oz)       Height: 5' 2"  (1.575 m)       Intake/Output Summary (Last 24 hours) at 03/18/2018 1252 Last data filed at 03/18/2018 0700 Gross per 24 hour  Intake 2259.5 ml  Output 650 ml  Net 1609.5 ml   Filed Weights   03/17/18 0300  Weight: 67.8 kg (149 lb 7.6 oz)    Examination:  General exam: middle-aged male, sitting comfortably in bed, no distress Respiratory system: Clear to auscultation Cardiovascular system: S1 & S2 heard, RRR Gastrointestinal system: Abdomen is nondistended, soft and nontender.Normal bowel sounds heard, colostomy noted with brownish tinged liquid Central nervous system: Alert and oriented. No focal neurological deficits. Extremities: no edema Skin: No rashes, lesions or ulcers Psychiatry: Judgement and insight appear normal. Mood & affect appropriate.     Data Reviewed:   CBC: Recent Labs  Lab 03/16/18 1117 03/17/18 0411 03/18/18 0350  WBC 8.4 5.6 4.5  HGB 11.8* 10.1* 10.3*  HCT 36.0* 31.8* 31.0*  MCV 97.6 96.7 95.1  PLT 306 264 627   Basic Metabolic Panel: Recent Labs  Lab 03/16/18 1117 03/17/18 0411 03/18/18 0350  NA 138 138 138  K 4.2 3.7 3.9  CL 104 106 106  CO2 26 24 24   GLUCOSE 103* 74 99  BUN 14 11 6   CREATININE 1.23 1.14 1.09  CALCIUM 9.2 8.6* 8.6*   GFR: Estimated Creatinine Clearance: 61.1 mL/min (by C-G formula based on SCr of 1.09 mg/dL). Liver Function Tests: Recent Labs  Lab 03/16/18 1117  AST 24  ALT 17  ALKPHOS 53  BILITOT 1.2  PROT 7.6  ALBUMIN 3.3*   Recent  Labs  Lab 03/16/18 1117  LIPASE 22   No results for input(s): AMMONIA in the last 168 hours. Coagulation Profile: No results for input(s): INR, PROTIME in the last 168 hours. Cardiac Enzymes: No results for input(s): CKTOTAL, CKMB, CKMBINDEX, TROPONINI in the last 168 hours. BNP (last 3 results) No results for input(s): PROBNP in the last 8760 hours. HbA1C: No results for input(s): HGBA1C in the last 72 hours. CBG: Recent Labs  Lab 03/17/18 1022  03/17/18 1041 03/18/18 0747  GLUCAP 35* 93 89   Lipid Profile: No results for input(s): CHOL, HDL, LDLCALC, TRIG, CHOLHDL, LDLDIRECT in the last 72 hours. Thyroid Function Tests: No results for input(s): TSH, T4TOTAL, FREET4, T3FREE, THYROIDAB in the last 72 hours. Anemia Panel: No results for input(s): VITAMINB12, FOLATE, FERRITIN, TIBC, IRON, RETICCTPCT in the last 72 hours. Urine analysis:    Component Value Date/Time   COLORURINE YELLOW 02/16/2018 1935   APPEARANCEUR CLEAR 02/16/2018 1935   LABSPEC 1.013 02/16/2018 1935   PHURINE 5.0 02/16/2018 1935   GLUCOSEU NEGATIVE 02/16/2018 [place] NEGATIVE 02/16/2018 [place] NEGATIVE 02/16/2018 1935   KETONESUR 20 (A) 02/16/2018 1935   PROTEINUR NEGATIVE 02/16/2018 1935   UROBILINOGEN 0.2 10/10/2014 1037   NITRITE NEGATIVE 02/16/2018 1935   LEUKOCYTESUR NEGATIVE 02/16/2018 1935   Sepsis Labs: @LABRCNTIP (procalcitonin:4,lacticidven:4)  )No results found for this or any previous visit (from the past 240 hour(s)).       Radiology Studies: Ct Abdomen Pelvis W Contrast  Result Date: 03/16/2018 CLINICAL DATA:  62 year old male with abdominal pain. Concern for gastroenteritis or colitis. History of Crohn's disease and hemicolectomy and colostomy. EXAM: CT ABDOMEN AND PELVIS WITH CONTRAST TECHNIQUE: Multidetector CT imaging of the abdomen and pelvis was performed using the standard protocol following bolus administration of intravenous contrast. CONTRAST:  154m ISOVUE-300 IOPAMIDOL (ISOVUE-300) INJECTION 61% COMPARISON:  Abdominal CT dated 02/16/2018 FINDINGS: Lower chest: Bibasilar linear atelectasis/scarring. The visualized lung bases are otherwise clear. No intra-abdominal free air or free fluid. Hepatobiliary: No focal liver abnormality is seen. No gallstones, gallbladder wall thickening, or biliary dilatation. Pancreas: Mild diffuse dilatation of the main pancreatic duct similar to prior CT. No mass or pancreatic  atrophy. No evidence of active inflammatory changes. Spleen: A 12 mm enhancing focus in the inferior pole of the spleen is not well characterized but likely represents a hemangioma. Adrenals/Urinary Tract: The adrenal glands are unremarkable. There is no hydronephrosis on either side. There is symmetric enhancement and excretion of contrast by both kidneys. Layering calcific density within the bladder likely passed stones or bladder calculi. Stomach/Bowel: There is postsurgical changes of colectomy with a right lower quadrant ileostomy. There is a focal area of tethering and abutment of adjacent loops of small bowel to the anterior pelvic wall and rectus sheath (series 3, image 57) compatible with adhesions. Additional areas of adhesions noted superiorly (series 3 image 40). There is a focal area of transition in the pelvis (series 3, image 56) with dilatation of the small bowel loops proximally compatible with obstruction. Additional transition point may be present secondary to adhesions. There is mild thickening and inflammatory changes of the small bowel loops in the lower abdomen and pelvis in the region of the adhesion, most likely reactive. A 4 cm defect in the left anterior abdominal wall with herniation of loops of small bowel appears similar to prior CT. No definite evidence of obstruction at this site. Vascular/Lymphatic: The abdominal aorta and IVC appear unremarkable. There is a retroaortic left  renal vein anatomy. No portal venous gas. Multiple enlarged lymph nodes in the right lower quadrant measure up to 14 mm in short axis not significantly changed since the prior CT. Reproductive: Lobulated soft tissue mass extending from the prostate gland posteriorly to the rectum and pelvic sidewall similar to prior CT. Other: Bilateral gluteal nodular lesions measure up to 3.3 x 2.0 cm on the right most consistent with enlarged and abnormal lymph nodes. Musculoskeletal: Sclerotic lesions primarily involving the  pelvic bone as well as right femur similar to prior CT consistent with metastatic disease. IMPRESSION: 1. Small-bowel obstruction with transition zone in the right hemipelvis secondary to adhesions. There is mild associated inflammatory changes and thickening of the small bowel loops, likely reactive. 2. Additional nonacute findings as described above and similar to prior CT including left spigelian hernia, prostate mass, right lower quadrant and bilateral gluteal adenopathy, and osseous metastatic disease. Electronically Signed   By: Anner Crete M.D.   On: 03/16/2018 21:48   Dg Abd 2 Views  Result Date: 03/17/2018 CLINICAL DATA:  Mid abdominal cramping and diarrhea. History of Crohn's disease with previous colostomy creation. EXAM: ABDOMEN - 2 VIEW COMPARISON:  Abdominal and pelvic CT scan of March 16, 2018 FINDINGS: There remain loops of mildly distended gas and fluid-filled small bowel within the mid and lower abdomen. There is no significant colonic gas. There is a hernia in the inferior left flank with air and fluid levels noted within bowel. There are no abnormal soft tissue calcifications. The bony structures exhibit no acute abnormality. There is contrast in the urinary bladder from the previous CT scan. IMPRESSION: Findings consistent with a persistent mid to distal small bowel obstruction. The degree of gaseous distention of the small bowel has decreased somewhat since yesterday's CT scan. Electronically Signed   By: David  Martinique M.D.   On: 03/17/2018 10:06   Dg Abd Portable 1v  Result Date: 03/18/2018 CLINICAL DATA:  Small bowel obstruction. EXAM: PORTABLE ABDOMEN - 1 VIEW COMPARISON:  03/17/2018 radiographs, 03/16/2018 CT and prior studies FINDINGS: No significant change in bowel gas pattern with a few scattered mildly distended gas-filled small bowel loops again noted. No other significant abnormalities are identified. IMPRESSION: Unchanged appearance of bowel gas pattern with mildly  distended gas-filled small bowel loops again noted which may represent persistent small-bowel obstruction. Electronically Signed   By: Margarette Canada M.D.   On: 03/18/2018 08:33        Scheduled Meds: . budesonide  9 mg Oral Daily  . sodium chloride flush  10-40 mL Intracatheter Q12H   Continuous Infusions: . dextrose 5 % and 0.45 % NaCl with KCl 20 mEq/L 75 mL/hr at 03/18/18 0136     LOS: 2 days    Time spent: 31mn    PDomenic Polite MD [facility] Page via [email or web address], password [email or web address] After 7PM please contact night-coverage  03/18/2018, 12:52 PM

## 2018-03-18 NOTE — Progress Notes (Addendum)
    CC:  Abdominal cramping and diarrhea    Subjective: He feels better this AM ostomy bag is empty but he has been emptying it.  Less distended, no cramping this AM.  + BS.    Objective: Vital signs in last 24 hours: Temp:  [98.1 F (36.7 C)-98.9 F (37.2 C)] 98.1 F (36.7 C) (03/28 0539) Pulse Rate:  [64-81] 64 (03/28 0539) Resp:  [17] 17 (03/28 0539) BP: (110-123)/(69-75) 111/71 (03/28 0539) SpO2:  [99 %] 99 % (03/28 0539) Last BM Date: 03/17/18 180 PO 2259 IV 500 urine  Ostomy output not recorded Afebrile, VSS Labs OK Film not read yet, air fluid level is gone, still some gas in SB, continues to improve  Intake/Output from previous day: 03/27 0701 - 03/28 0700 In: 2259.5 [P.O.:180; I.V.:2079.5] Out: 500 [Urine:500] Intake/Output this shift: No intake/output data recorded.  General appearance: alert, cooperative and no distress Resp: clear to auscultation bilaterally GI: soft, less distended, few BS, ostomy working.    Lab Results:  Recent Labs    03/17/18 0411 03/18/18 0350  WBC 5.6 4.5  HGB 10.1* 10.3*  HCT 31.8* 31.0*  PLT 264 259    BMET Recent Labs    03/17/18 0411 03/18/18 0350  NA 138 138  K 3.7 3.9  CL 106 106  CO2 24 24  GLUCOSE 74 99  BUN 11 6  CREATININE 1.14 1.09  CALCIUM 8.6* 8.6*   PT/INR No results for input(s): LABPROT, INR in the last 72 hours.  Recent Labs  Lab 03/16/18 1117  AST 24  ALT 17  ALKPHOS 53  BILITOT 1.2  PROT 7.6  ALBUMIN 3.3*     Lipase     Component Value Date/Time   LIPASE 22 03/16/2018 1117     Medications: . budesonide  9 mg Oral Daily  . sodium chloride flush  10-40 mL Intracatheter Q12H   . dextrose 5 % and 0.45 % NaCl with KCl 20 mEq/L 75 mL/hr at 03/18/18 0136    Assessment/Plan Metastatic prostate cancer Mild anemia   Partial small bowel obstruction/history of Crohn's since age 3 Multiple prior surgeries/partial colectomies/ileostomy right-sided colostomy -Dr.   Anthony Sar  FEN: IV fluids/NPO =>> full liquids now, and advance later to a soft diet if he does well ID:  None DVT:  SCD -  Foley: None Follow up:  TBD  Plan:  Full liquids today and if OK soft diet at supper.          LOS: 2 days    Gabrianna Fassnacht 03/18/2018 [id number]

## 2018-03-19 DIAGNOSIS — C61 Malignant neoplasm of prostate: Secondary | ICD-10-CM

## 2018-03-19 DIAGNOSIS — K56609 Unspecified intestinal obstruction, unspecified as to partial versus complete obstruction: Secondary | ICD-10-CM

## 2018-03-19 DIAGNOSIS — K50819 Crohn's disease of both small and large intestine with unspecified complications: Secondary | ICD-10-CM

## 2018-03-19 DIAGNOSIS — R197 Diarrhea, unspecified: Secondary | ICD-10-CM

## 2018-03-19 LAB — GLUCOSE, CAPILLARY: Glucose-Capillary: 91 mg/dL (ref 65–99)

## 2018-03-19 MED ORDER — HEPARIN SOD (PORK) LOCK FLUSH 100 UNIT/ML IV SOLN
500.0000 [IU] | INTRAVENOUS | Status: AC | PRN
  Administered 2018-03-19: 500 [IU]

## 2018-03-19 NOTE — Progress Notes (Signed)
    QV:[id number] cramping and diarrhea    Subjective: He looks good, tolerating soft diet.  No further cramping.  Ostomy is working well.  Objective: Vital signs in last 24 hours: Temp:  [98.4 F (36.9 C)-98.6 F (37 C)] 98.4 F (36.9 C) (03/29 0557) Pulse Rate:  [63-73] 63 (03/29 0557) Resp:  [16-17] 17 (03/29 0557) BP: (111-120)/(68-70) 111/70 (03/29 0557) SpO2:  [100 %] 100 % (03/29 0557) Last BM Date: 03/17/18 420 p.o. 200 urine recorded No stool recorded Afebrile vital signs are stable No labs No film Intake/Output from previous day: 03/28 0701 - 03/29 0700 In: 420 [P.O.:420] Out: 200 [Urine:200] Intake/Output this shift: No intake/output data recorded.  General appearance: alert, cooperative and no distress Resp: clear to auscultation bilaterally GI: Soft ostomy is working well, no further cramping no tenderness.  All of the hernias are soft and nontender.  Lab Results:  Recent Labs    03/17/18 0411 03/18/18 0350  WBC 5.6 4.5  HGB 10.1* 10.3*  HCT 31.8* 31.0*  PLT 264 259    BMET Recent Labs    03/17/18 0411 03/18/18 0350  NA 138 138  K 3.7 3.9  CL 106 106  CO2 24 24  GLUCOSE 74 99  BUN 11 6  CREATININE 1.14 1.09  CALCIUM 8.6* 8.6*   PT/INR No results for input(s): LABPROT, INR in the last 72 hours.  Recent Labs  Lab 03/16/18 1117  AST 24  ALT 17  ALKPHOS 53  BILITOT 1.2  PROT 7.6  ALBUMIN 3.3*     Lipase     Component Value Date/Time   LIPASE 22 03/16/2018 1117     Medications: . budesonide  9 mg Oral Daily  . enoxaparin (LOVENOX) injection  40 mg Subcutaneous Q24H  . sodium chloride flush  10-40 mL Intracatheter Q12H    Assessment/Plan Metastatic prostate cancer Mild anemia  Partial small bowel obstruction/history of Crohn's since age 54 Multiple prior surgeries/partial colectomies/ileostomy right-sided colostomy-Dr.DeMason  FEN: IV fluids/NPO =>> full liquids now, and advance later to a soft diet if he  does well ID: None DVT: SCD -  Foley:None Follow up: TBD  Plan: I think he can go home on a soft diet for another week or 2, and follow-up with his primary physicians at home.  We will see again as needed.        LOS: 3 days    Terrah Decoster 03/19/2018 [phone]

## 2018-03-19 NOTE — Discharge Instructions (Signed)
Follow with Alycia Rossetti, MD in 5-7 days  Please get a complete blood count and chemistry panel checked by your Primary MD at your next visit, and again as instructed by your Primary MD. Please get your medications reviewed and adjusted by your Primary MD.  Please request your Primary MD to go over all Hospital Tests and Procedure/Radiological results at the follow up, please get all Hospital records sent to your Prim MD by signing hospital release before you go home.  If you had Pneumonia of Lung problems at the Hospital: Please get a 2 view Chest X ray done in 6-8 weeks after hospital discharge or sooner if instructed by your Primary MD.  If you have Congestive Heart Failure: Please call your Cardiologist or Primary MD anytime you have any of the following symptoms:  1) 3 pound weight gain in 24 hours or 5 pounds in 1 week  2) shortness of breath, with or without a dry hacking cough  3) swelling in the hands, feet or stomach  4) if you have to sleep on extra pillows at night in order to breathe  Follow cardiac low salt diet and 1.5 lit/day fluid restriction.  If you have diabetes Accuchecks 4 times/day, Once in AM empty stomach and then before each meal. Log in all results and show them to your primary doctor at your next visit. If any glucose reading is under 80 or above 300 call your primary MD immediately.  If you have Seizure/Convulsions/Epilepsy: Please do not drive, operate heavy machinery, participate in activities at heights or participate in high speed sports until you have seen by Primary MD or a Neurologist and advised to do so again.  If you had Gastrointestinal Bleeding: Please ask your Primary MD to check a complete blood count within one week of discharge or at your next visit. Your endoscopic/colonoscopic biopsies that are pending at the time of discharge, will also need to followed by your Primary MD.  Get Medicines reviewed and adjusted. Please take all your  medications with you for your next visit with your Primary MD  Please request your Primary MD to go over all hospital tests and procedure/radiological results at the follow up, please ask your Primary MD to get all Hospital records sent to his/her office.  If you experience worsening of your admission symptoms, develop shortness of breath, life threatening emergency, suicidal or homicidal thoughts you must seek medical attention immediately by calling 911 or calling your MD immediately  if symptoms less severe.  You must read complete instructions/literature along with all the possible adverse reactions/side effects for all the Medicines you take and that have been prescribed to you. Take any new Medicines after you have completely understood and accpet all the possible adverse reactions/side effects.   Do not drive or operate heavy machinery when taking Pain medications.   Do not take more than prescribed Pain, Sleep and Anxiety Medications  Special Instructions: If you have smoked or chewed Tobacco  in the last 2 yrs please stop smoking, stop any regular Alcohol  and or any Recreational drug use.  Wear Seat belts while driving.  Please note You were cared for by a hospitalist during your hospital stay. If you have any questions about your discharge medications or the care you received while you were in the hospital after you are discharged, you can call the unit and asked to speak with the hospitalist on call if the hospitalist that took care of you is not available.  Once you are discharged, your primary care physician will handle any further medical issues. Please note that NO REFILLS for any discharge medications will be authorized once you are discharged, as it is imperative that you return to your primary care physician (or establish a relationship with a primary care physician if you do not have one) for your aftercare needs so that they can reassess your need for medications and monitor your  lab values.  You can reach the hospitalist office at phone [phone] or fax [phone]   If you do not have a primary care physician, you can call [phone] for a physician referral.  Activity: As tolerated with Full fall precautions use walker/cane & assistance as needed  Diet: soft  Disposition Home

## 2018-03-19 NOTE — Discharge Summary (Signed)
Physician Discharge Summary  Daniel Howard SPQ:[id number] DOB: [birth year] DOA: 03/16/2018  PCP: Daniel Rossetti, MD  Admit date: 03/16/2018 Discharge date: 03/19/2018  Admitted From: Home Disposition: Home  Recommendations for Outpatient Follow-up:  1. Follow up with PCP in 1-2 weeks 2. Please obtain BMP/CBC in one week  Home Health: none Equipment/Devices: none  Discharge Condition: stable CODE STATUS: Full code Diet recommendation: Soft diet  HPI: Per Dr. Myna Howard, Daniel Howard is a 62 y.o. male with medical history significant for prostate cancer with bony metastases followed by urology, Crohn's disease on steroids, now presenting to the emergency department for evaluation of abdominal pain and diarrhea.  Patient was admitted to the hospital 1 month ago with SBO, improved rapidly with conservative management and remained in his usual state until a return in abdominal pain with nausea over the past 1-2 days.  Reports severe pain that is waxing and waning, primarily in the lower quadrants and mid abdomen, associated with nausea but no vomiting.  He denies fevers or chills.  Has had liquid stool in his ileostomy bag.  Reports experiencing similar symptoms last month that led to his admission.    Hospital Course: Small bowel obstruction -hospital with abdominal complaints, imaging revealed small bowel obstruction, this was likely secondary to adhesions versus Crohn's disease.  General surgery was consulted and have followed patient while hospitalized.  He was treated conservatively with IV fluids, n.p.o., he improved, looks like his small bowel obstruction is resolved, his diet was advanced and he is now able to tolerate a regular diet and has been having bowel movements and his ostomy bag.  He will be discharged home in stable condition with outpatient follow-up Crohn's disease -continue Entocort, CT scan without any abscess/fistula, follow-up with GI as an outpatient Metastatic  prostate cancer -follow-up with urology as an outpatient Chronic anemia -hemoglobin stable, monitor as an outpatient  Discharge Diagnoses:  Principal Problem:   SBO (small bowel obstruction) ([place]) Active Problems:   Regional enteritis/Crohn's   Crohn's disease of both small and large intestine with complication ([place])   Prostate cancer ([place])     Discharge Instructions   Allergies as of 03/19/2018      Reactions   Penicillins Hives   Has patient had a PCN reaction causing immediate rash, facial/tongue/throat swelling, SOB or lightheadedness with hypotension: Yes Has patient had a PCN reaction causing severe rash involving mucus membranes or skin necrosis: No Has patient had a PCN reaction that required hospitalization: No Has patient had a PCN reaction occurring within the last 10 years: No If all of the above answers are "NO", then may proceed with Cephalosporin use.      Medication List    STOP taking these medications   docusate sodium 100 MG capsule Commonly known as:  COLACE     TAKE these medications   budesonide 3 MG 24 hr capsule Commonly known as:  ENTOCORT EC Take 3 capsules (9 mg total) by mouth daily.   calcium carbonate 600 MG Tabs tablet Commonly known as:  OS-CAL Take 1,200 mg by mouth daily with breakfast.   CENTRUM SILVER ADULT 50+ Tabs Take 1 tablet by mouth daily.   Ferrous Sulfate 28 MG Tabs Take 1 tablet by mouth daily.   HYDROcodone-acetaminophen 5-325 MG tablet Commonly known as:  NORCO/VICODIN Take 1 tablet by mouth every 6 (six) hours as needed for moderate pain.   lidocaine-prilocaine cream Commonly known as:  EMLA Apply 1 application topically as needed (when  accessing port).   mirtazapine 15 MG tablet Commonly known as:  REMERON TAKE 1 TABLET BY MOUTH AT BEDTIME What changed:    how much to take  how to take this  when to take this      Follow-up Information    [place], [name], MD. Schedule an appointment as soon as  possible for a visit in 1 week(s).   Specialty:  Family Medicine Contact information: [address] [place] [address] [phone]           Consultations:  General surgery   Procedures/Studies:  Ct Abdomen Pelvis W Contrast  Result Date: 03/16/2018 CLINICAL DATA:  62 year old male with abdominal pain. Concern for gastroenteritis or colitis. History of Crohn's disease and hemicolectomy and colostomy. EXAM: CT ABDOMEN AND PELVIS WITH CONTRAST TECHNIQUE: Multidetector CT imaging of the abdomen and pelvis was performed using the standard protocol following bolus administration of intravenous contrast. CONTRAST:  165m ISOVUE-300 IOPAMIDOL (ISOVUE-300) INJECTION 61% COMPARISON:  Abdominal CT dated 02/16/2018 FINDINGS: Lower chest: Bibasilar linear atelectasis/scarring. The visualized lung bases are otherwise clear. No intra-abdominal free air or free fluid. Hepatobiliary: No focal liver abnormality is seen. No gallstones, gallbladder wall thickening, or biliary dilatation. Pancreas: Mild diffuse dilatation of the main pancreatic duct similar to prior CT. No mass or pancreatic atrophy. No evidence of active inflammatory changes. Spleen: A 12 mm enhancing focus in the inferior pole of the spleen is not well characterized but likely represents a hemangioma. Adrenals/Urinary Tract: The adrenal glands are unremarkable. There is no hydronephrosis on either side. There is symmetric enhancement and excretion of contrast by both kidneys. Layering calcific density within the bladder likely passed stones or bladder calculi. Stomach/Bowel: There is postsurgical changes of colectomy with a right lower quadrant ileostomy. There is a focal area of tethering and abutment of adjacent loops of small bowel to the anterior pelvic wall and rectus sheath (series 3, image 57) compatible with adhesions. Additional areas of adhesions noted superiorly (series 3 image 40). There is a focal area of transition in the  pelvis (series 3, image 56) with dilatation of the small bowel loops proximally compatible with obstruction. Additional transition point may be present secondary to adhesions. There is mild thickening and inflammatory changes of the small bowel loops in the lower abdomen and pelvis in the region of the adhesion, most likely reactive. A 4 cm defect in the left anterior abdominal wall with herniation of loops of small bowel appears similar to prior CT. No definite evidence of obstruction at this site. Vascular/Lymphatic: The abdominal aorta and IVC appear unremarkable. There is a retroaortic left renal vein anatomy. No portal venous gas. Multiple enlarged lymph nodes in the right lower quadrant measure up to 14 mm in short axis not significantly changed since the prior CT. Reproductive: Lobulated soft tissue mass extending from the prostate gland posteriorly to the rectum and pelvic sidewall similar to prior CT. Other: Bilateral gluteal nodular lesions measure up to 3.3 x 2.0 cm on the right most consistent with enlarged and abnormal lymph nodes. Musculoskeletal: Sclerotic lesions primarily involving the pelvic bone as well as right femur similar to prior CT consistent with metastatic disease. IMPRESSION: 1. Small-bowel obstruction with transition zone in the right hemipelvis secondary to adhesions. There is mild associated inflammatory changes and thickening of the small bowel loops, likely reactive. 2. Additional nonacute findings as described above and similar to prior CT including left spigelian hernia, prostate mass, right lower quadrant and bilateral gluteal  adenopathy, and osseous metastatic disease. Electronically Signed   By: Anner Crete M.D.   On: 03/16/2018 21:48   Dg Abd 2 Views  Result Date: 03/17/2018 CLINICAL DATA:  Mid abdominal cramping and diarrhea. History of Crohn's disease with previous colostomy creation. EXAM: ABDOMEN - 2 VIEW COMPARISON:  Abdominal and pelvic CT scan of March 16, 2018  FINDINGS: There remain loops of mildly distended gas and fluid-filled small bowel within the mid and lower abdomen. There is no significant colonic gas. There is a hernia in the inferior left flank with air and fluid levels noted within bowel. There are no abnormal soft tissue calcifications. The bony structures exhibit no acute abnormality. There is contrast in the urinary bladder from the previous CT scan. IMPRESSION: Findings consistent with a persistent mid to distal small bowel obstruction. The degree of gaseous distention of the small bowel has decreased somewhat since yesterday's CT scan. Electronically Signed   By: David  [place] M.D.   On: 03/17/2018 10:06   Dg Abd Portable 1v  Result Date: 03/18/2018 CLINICAL DATA:  Small bowel obstruction. EXAM: PORTABLE ABDOMEN - 1 VIEW COMPARISON:  03/17/2018 radiographs, 03/16/2018 CT and prior studies FINDINGS: No significant change in bowel gas pattern with a few scattered mildly distended gas-filled small bowel loops again noted. No other significant abnormalities are identified. IMPRESSION: Unchanged appearance of bowel gas pattern with mildly distended gas-filled small bowel loops again noted which may represent persistent small-bowel obstruction. Electronically Signed   By: Margarette Canada M.D.   On: 03/18/2018 08:33     Subjective: - no chest pain, shortness of breath, no abdominal pain, nausea or vomiting.   Discharge Exam: Vitals:   03/18/18 2103 03/19/18 0557  BP: 117/68 111/70  Pulse: 73 63  Resp: 17 17  Temp: 98.6 F (37 C) 98.4 F (36.9 C)  SpO2: 100% 100%    General: Pt is alert, awake, not in acute distress Cardiovascular: RRR, S1/S2 +, no rubs, no gallops Respiratory: CTA bilaterally, no wheezing, no rhonchi Abdominal: Soft, NT, ND, bowel sounds +, ostomy bag in place with brown material    The results of significant diagnostics from this hospitalization (including imaging, microbiology, ancillary and laboratory) are listed  below for reference.     Microbiology: No results found for this or any previous visit (from the past 240 hour(s)).   Labs: BNP (last 3 results) No results for input(s): BNP in the last 8760 hours. Basic Metabolic Panel: Recent Labs  Lab 03/16/18 1117 03/17/18 0411 03/18/18 0350  NA 138 138 138  K 4.2 3.7 3.9  CL 104 106 106  CO2 26 24 24   GLUCOSE 103* 74 99  BUN 14 11 6   CREATININE 1.23 1.14 1.09  CALCIUM 9.2 8.6* 8.6*   Liver Function Tests: Recent Labs  Lab 03/16/18 1117  AST 24  ALT 17  ALKPHOS 53  BILITOT 1.2  PROT 7.6  ALBUMIN 3.3*   Recent Labs  Lab 03/16/18 1117  LIPASE 22   No results for input(s): AMMONIA in the last 168 hours. CBC: Recent Labs  Lab 03/16/18 1117 03/17/18 0411 03/18/18 0350  WBC 8.4 5.6 4.5  HGB 11.8* 10.1* 10.3*  HCT 36.0* 31.8* 31.0*  MCV 97.6 96.7 95.1  PLT 306 264 259   Cardiac Enzymes: No results for input(s): CKTOTAL, CKMB, CKMBINDEX, TROPONINI in the last 168 hours. BNP: Invalid input(s): POCBNP CBG: Recent Labs  Lab 03/17/18 1022 03/17/18 1041 03/18/18 0747 03/19/18 0801  GLUCAP 35* 93 89 91  D-Dimer No results for input(s): DDIMER in the last 72 hours. Hgb A1c No results for input(s): HGBA1C in the last 72 hours. Lipid Profile No results for input(s): CHOL, HDL, LDLCALC, TRIG, CHOLHDL, LDLDIRECT in the last 72 hours. Thyroid function studies No results for input(s): TSH, T4TOTAL, T3FREE, THYROIDAB in the last 72 hours.  Invalid input(s): FREET3 Anemia work up No results for input(s): VITAMINB12, FOLATE, FERRITIN, TIBC, IRON, RETICCTPCT in the last 72 hours. Urinalysis    Component Value Date/Time   COLORURINE YELLOW 02/16/2018 1935   APPEARANCEUR CLEAR 02/16/2018 1935   LABSPEC 1.013 02/16/2018 1935   PHURINE 5.0 02/16/2018 1935   GLUCOSEU NEGATIVE 02/16/2018 1935   HGBUR NEGATIVE 02/16/2018 [place] NEGATIVE 02/16/2018 1935   KETONESUR 20 (A) 02/16/2018 1935   PROTEINUR NEGATIVE  02/16/2018 1935   UROBILINOGEN 0.2 10/10/2014 1037   NITRITE NEGATIVE 02/16/2018 1935   LEUKOCYTESUR NEGATIVE 02/16/2018 1935   Sepsis Labs Invalid input(s): PROCALCITONIN,  WBC,  LACTICIDVEN   Time coordinating discharge: 40 minutes  SIGNED:  Marzetta Board, MD  [facility] 03/19/2018, 12:42 PM Pager [phone]  If 7PM-7AM, please contact night-coverage [email or web address] Password [email or web address]

## 2018-03-19 NOTE — Care Management Important Message (Signed)
Important Message  Patient Details  Name: Daniel Howard MRN: [id number] Date of Birth: 1956-10-25   Medicare Important Message Given:  Yes    [name] 03/19/2018, 1:53 PM

## 2018-03-30 ENCOUNTER — Other Ambulatory Visit: Payer: Self-pay | Admitting: Family Medicine

## 2018-03-30 MED ORDER — HYDROCODONE-ACETAMINOPHEN 5-325 MG PO TABS: 1.0000 | ORAL_TABLET | Freq: Four times a day (QID) | ORAL | 0 refills | Status: DC | PRN

## 2018-03-30 NOTE — Telephone Encounter (Signed)
Refill on hydrocodone to [facility].

## 2018-03-30 NOTE — Telephone Encounter (Signed)
Ok to refill??  Last office visit 01/12/2018.  Last refill 02/15/2018.

## 2018-03-30 NOTE — Addendum Note (Signed)
Addended by: Sheral Flow on: 03/30/2018 04:38 PM   Modules accepted: Orders

## 2018-04-03 ENCOUNTER — Ambulatory Visit (HOSPITAL_COMMUNITY)
Admission: EM | Admit: 2018-04-03 | Discharge: 2018-04-03 | Disposition: A | Discharge status: Home / Self Care | Payer: Medicare Other | Attending: Family Medicine | Admitting: Family Medicine

## 2018-04-03 ENCOUNTER — Encounter (HOSPITAL_COMMUNITY): Payer: Self-pay

## 2018-04-03 ENCOUNTER — Other Ambulatory Visit: Payer: Self-pay

## 2018-04-03 DIAGNOSIS — L299 Pruritus, unspecified: Secondary | ICD-10-CM

## 2018-04-03 MED ORDER — TRIAMCINOLONE ACETONIDE 0.5 % EX OINT: 1.0000 "application " | TOPICAL_OINTMENT | Freq: Two times a day (BID) | CUTANEOUS | 0 refills | Status: DC

## 2018-04-03 NOTE — ED Triage Notes (Signed)
Pt presents with complaints of itching to the right side of his face x 2 weeks.

## 2018-04-03 NOTE — ED Provider Notes (Signed)
[facility]    CSN: [id number] Arrival date & time: 04/03/18  1812     History   Chief Complaint No chief complaint on file.   HPI Daniel Howard is a 62 y.o. male.   62 yo male here for right side facial itching over the scar where he had I&D a few months ago. He also has itching over his port scar.      Past Medical History:  Diagnosis Date  . Abnormal finding of biliary tract    MRCP shows pancreatic/biliary tract dilation. EUS 2010 confirmed dilation but no chronic pancreaitis or mass. Vascular ectasia crimpoing distal CBD.   [name] Anxiety   . 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 Duodenal ulcer 2010   nsaids  . History of blood transfusion 1992   "related to colon OR"  . Peristomal hernia   . Prostate cancer ([place]) 2018  . Small bowel obstruction ([place]) 10/2017; 11/21/2017; 02/16/2018  . Spigelian hernia    bilateral    Patient Active Problem List   Diagnosis Date Noted  . Nausea with vomiting 11/10/2017  . Spigelian hernia 09/22/2017  . Abdominal pain of multiple sites 09/22/2017  . Goals of care, counseling/discussion 04/02/2017  . Prostate cancer ([place]) 04/02/2017  . Elevated PSA 03/04/2017  . Osteopenia 11/03/2016  . Pelvic mass in male 09/04/2016  . 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 Exacerbation of Crohn's disease ([place]) 09/04/2016  . Protein-calorie malnutrition, severe ([place]) 07/27/2014  . SBO (small bowel obstruction) ([place]) 07/26/2014  . Loss of weight 06/09/2014  . Crohn's disease of both small and large intestine with complication ([place]) 25/36/6440  . Boils 02/11/2013  . Ventral hernia 12/11/2009  .  Anemia 12/03/2009  . Regional enteritis/Crohn's 01/25/2007    Past Surgical History:  Procedure Laterality Date  . APPENDECTOMY  1992   at time of exp laparotomy at which time he was noted to have fistulizing Crohn's rather than UC  . COLON SURGERY    . COLONOSCOPY [name] 08/23/2014   [id number] proctoscopy with possible fistulous opening in thebase of rectal/anal stump.    . COLOSTOMY  1992   transverse loop colostomy secondary to a stricture  . ESOPHAGOGASTRODUODENOSCOPY  05/2009   SLF: multiple antral erosions, large ulcer at ansatomosis (postsurgical changes at duodenal bulb and second portion of duodenum) BX c/x NSAIDS.  . EUS  10/04/2009   Dr. Estill Bakes dilated CBD and main pancreatic duct.  No pancreatic  . EXPLORATORY LAPAROTOMY  1992  . FLEXIBLE SIGMOIDOSCOPY  1988   Dr. Laural Golden- suggested rohn's disease but the biopsies were not collaborative.  [name] FLEXIBLE SIGMOIDOSCOPY [name] 09/08/2016   Procedure: FLEXIBLE SIGMOIDOSCOPY;  Surgeon: Wonda Horner, MD;  Location: [facility] ENDOSCOPY;  Service: Gastroenterology;  Laterality: [name];  . HEMICOLOECTOMY W/ ANASTOMOSIS  1993   R- Dr.DeMason   . HERNIA REPAIR  1996   incarcerated periostial hernia with additional surgery in 1999  . IR FLUORO GUIDE PORT INSERTION RIGHT  04/03/2017  . IR US GUIDE VASC ACCESS RIGHT  04/03/2017       Home Medications    Prior to Admission medications   Medication Sig Start Date End Date Taking? Authorizing Provider  budesonide (ENTOCORT  EC) 3 MG 24 hr capsule Take 3 capsules (9 mg total) by mouth daily. 12/02/17  Yes [place], [name], MD  calcium carbonate (OS-CAL) 600 MG TABS tablet Take 1,200 mg by mouth daily with breakfast.   Yes [provider]  Ferrous Sulfate 28 MG TABS Take 1 tablet by mouth daily.   Yes [provider]  HYDROcodone-acetaminophen (NORCO/VICODIN) 5-325 MG tablet Take 1 tablet by mouth every 6 (six) hours as needed for moderate pain. 03/30/18  Yes [place], [name], MD    Multiple Vitamins-Minerals (CENTRUM SILVER ADULT 50+) TABS Take 1 tablet by mouth daily.   Yes [provider]  lidocaine-prilocaine (EMLA) cream Apply 1 application topically as needed (when accessing port).    [provider]  mirtazapine (REMERON) 15 MG tablet TAKE 1 TABLET BY MOUTH AT BEDTIME Patient taking differently: TAKE 15 mg TABLET BY MOUTH AT BEDTIME 10/14/17   Alycia Rossetti, MD    Family History Family History  Problem Relation Age of Onset  . Cancer Father        prostate   . Prostate cancer Father   . Colon cancer Father 62  . Hypertension Sister   . Cancer Sister   . Depression Sister   . Breast cancer Sister   . COPD Sister   . Aneurysm Brother        deceased, brain aneurysm    Social History Social History   Tobacco Use  . Smoking status: Former Smoker    Packs/day: 0.75    Years: 32.00    Pack years: 24.00    Types: Cigarettes    Last attempt to quit: 12/21/2009    Years since quitting: 8.2  . Smokeless tobacco: Never Used  Substance Use Topics  . Alcohol use: No    Alcohol/week: 0.0 oz    Comment: Former drinker  . Drug use: No     Allergies   Penicillins   Review of Systems Review of Systems  Constitutional: Negative for activity change and appetite change.  HENT: Negative for congestion and ear discharge.   Eyes: Negative for discharge and itching.  Respiratory: Negative for apnea and chest tightness.   Cardiovascular: Negative for chest pain and leg swelling.  Gastrointestinal: Negative for abdominal distention and abdominal pain.  Endocrine: Negative for cold intolerance and heat intolerance.  Genitourinary: Negative for difficulty urinating and dysuria.  Musculoskeletal: Negative for arthralgias and back pain.  Neurological: Negative for dizziness and headaches.  Hematological: Negative for adenopathy. Does not bruise/bleed easily.     Physical Exam Triage Vital Signs ED Triage Vitals  Enc Vitals Group      BP 04/03/18 1847 111/76     Pulse Rate 04/03/18 1844 97     Resp 04/03/18 1844 18     Temp 04/03/18 1844 98.1 F (36.7 C)     Temp src --      SpO2 04/03/18 1844 98 %     Weight 04/03/18 1845 149 lb (67.6 kg)     Height --      Head Circumference --      Peak Flow --      Pain Score 04/03/18 1845 0     Pain Loc --      Pain Edu? --      Excl. in [place]? --    No data found.  Updated Vital Signs BP 111/76   Pulse 97   Temp 98.1 F (36.7 C)   Resp 18   Wt  149 lb (67.6 kg)   SpO2 98%   BMI 27.25 kg/m   Visual Acuity Right Eye Distance:   Left Eye Distance:   Bilateral Distance:    Right Eye Near:   Left Eye Near:    Bilateral Near:     Physical Exam  CONSTITUTIONAL: Well-developed, well-nourished male in no acute distress.  EYES: EOM intact, conjunctivae normal, no scleral icterus HEAD: Normocephalic, atraumatic ENT: External right and left ear normal, oropharynx is clear and moist. CARDIOVASCULAR: No cyanosis or edema. 2+ distal pulses.  RESPIRATORY:Effort and breath sounds normal, no problems with respiration noted. MUSCULOSKELETAL: Normal range of motion. No tenderness. SKIN: Skin is warm and dry. No rash noted. Not diaphoretic. No erythema. No pallor. [place]: Alert and oriented to person, place, and time. Normal reflexes, muscle tone, coordination. No cranial nerve deficit noted. PSYCHIATRIC: Normal mood and affect. Normal behavior. Normal judgment and thought content. HEM/LYMPH/IMMUNOLOGIC: Neck supple, no masses.  Normal thyroid.   UC Treatments / Results  Labs (all labs ordered are listed, but only abnormal results are displayed) Labs Reviewed - No data to display  EKG None Radiology No results found.  Procedures Procedures (including critical care time)  Medications Ordered in UC Medications - No data to display   Initial Impression / Assessment and Plan / UC Course  I have reviewed the triage vital signs and the nursing  notes.  Pertinent labs & imaging results that were available during my care of the patient were reviewed by me and considered in my medical decision making (see chart for details).     1. Skin itching- most likely related to scar. Prescribe topical steroid.  Final Clinical Impressions(s) / UC Diagnoses   Final diagnoses:  None    ED Discharge Orders    None       Controlled Substance Prescriptions [place] Controlled Substance Registry consulted? Not Applicable   Dannielle Huh, DO 04/03/18 1921

## 2018-04-05 DIAGNOSIS — Z933 Colostomy status: Secondary | ICD-10-CM | POA: Diagnosis not present

## 2018-04-05 DIAGNOSIS — K5090 Crohn's disease, unspecified, without complications: Secondary | ICD-10-CM | POA: Diagnosis not present

## 2018-04-06 ENCOUNTER — Inpatient Hospital Stay: Payer: Medicare Other | Attending: Oncology | Admitting: Medical

## 2018-04-06 VITALS — BP 122/66 | HR 64 | Temp 98.2°F | Resp 18 | Ht 62.0 in | Wt 149.4 lb

## 2018-04-06 DIAGNOSIS — K5090 Crohn's disease, unspecified, without complications: Secondary | ICD-10-CM | POA: Insufficient documentation

## 2018-04-06 DIAGNOSIS — Z79899 Other long term (current) drug therapy: Secondary | ICD-10-CM | POA: Diagnosis not present

## 2018-04-06 DIAGNOSIS — R590 Localized enlarged lymph nodes: Secondary | ICD-10-CM

## 2018-04-06 DIAGNOSIS — C7951 Secondary malignant neoplasm of bone: Secondary | ICD-10-CM | POA: Insufficient documentation

## 2018-04-06 DIAGNOSIS — C61 Malignant neoplasm of prostate: Secondary | ICD-10-CM | POA: Diagnosis not present

## 2018-04-06 DIAGNOSIS — M542 Cervicalgia: Secondary | ICD-10-CM | POA: Diagnosis not present

## 2018-04-06 DIAGNOSIS — C778 Secondary and unspecified malignant neoplasm of lymph nodes of multiple regions: Secondary | ICD-10-CM | POA: Diagnosis present

## 2018-04-06 DIAGNOSIS — M62838 Other muscle spasm: Secondary | ICD-10-CM | POA: Diagnosis not present

## 2018-04-06 DIAGNOSIS — Z452 Encounter for adjustment and management of vascular access device: Secondary | ICD-10-CM | POA: Diagnosis present

## 2018-04-06 DIAGNOSIS — Z87891 Personal history of nicotine dependence: Secondary | ICD-10-CM | POA: Diagnosis not present

## 2018-04-06 MED ORDER — CYCLOBENZAPRINE HCL 5 MG PO TABS: 5.0000 mg | ORAL_TABLET | Freq: Three times a day (TID) | ORAL | 0 refills | Status: DC | PRN

## 2018-04-06 NOTE — Patient Instructions (Signed)
Implanted Port Home Guide An implanted port is a type of central line that is placed under the skin. Central lines are used to provide IV access when treatment or nutrition needs to be given through a person's veins. Implanted ports are used for long-term IV access. An implanted port may be placed because:  You need IV medicine that would be irritating to the small veins in your hands or arms.  You need long-term IV medicines, such as antibiotics.  You need IV nutrition for a long period.  You need frequent blood draws for lab tests.  You need dialysis.  Implanted ports are usually placed in the chest area, but they can also be placed in the upper arm, the abdomen, or the leg. An implanted port has two main parts:  Reservoir. The reservoir is round and will appear as a small, raised area under your skin. The reservoir is the part where a needle is inserted to give medicines or draw blood.  Catheter. The catheter is a thin, flexible tube that extends from the reservoir. The catheter is placed into a large vein. Medicine that is inserted into the reservoir goes into the catheter and then into the vein.  How will I care for my incision site? Do not get the incision site wet. Bathe or shower as directed by your health care provider. How is my port accessed? Special steps must be taken to access the port:  Before the port is accessed, a numbing cream can be placed on the skin. This helps numb the skin over the port site.  Your health care provider uses a sterile technique to access the port. ? Your health care provider must put on a mask and sterile gloves. ? The skin over your port is cleaned carefully with an antiseptic and allowed to dry. ? The port is gently pinched between sterile gloves, and a needle is inserted into the port.  Only "non-coring" port needles should be used to access the port. Once the port is accessed, a blood return should be checked. This helps ensure that the port  is in the vein and is not clogged.  If your port needs to remain accessed for a constant infusion, a clear (transparent) bandage will be placed over the needle site. The bandage and needle will need to be changed every week, or as directed by your health care provider.  Keep the bandage covering the needle clean and dry. Do not get it wet. Follow your health care provider's instructions on how to take a shower or bath while the port is accessed.  If your port does not need to stay accessed, no bandage is needed over the port.  What is flushing? Flushing helps keep the port from getting clogged. Follow your health care provider's instructions on how and when to flush the port. Ports are usually flushed with saline solution or a medicine called heparin. The need for flushing will depend on how the port is used.  If the port is used for intermittent medicines or blood draws, the port will need to be flushed: ? After medicines have been given. ? After blood has been drawn. ? As part of routine maintenance.  If a constant infusion is running, the port may not need to be flushed.  How long will my port stay implanted? The port can stay in for as long as your health care provider thinks it is needed. When it is time for the port to come out, surgery will be   done to remove it. The procedure is similar to the one performed when the port was put in. When should I seek immediate medical care? When you have an implanted port, you should seek immediate medical care if:  You notice a bad smell coming from the incision site.  You have swelling, redness, or drainage at the incision site.  You have more swelling or pain at the port site or the surrounding area.  You have a fever that is not controlled with medicine.  This information is not intended to replace advice given to you by your health care provider. Make sure you discuss any questions you have with your health care provider. Document  Released: 12/08/2005 Document Revised: 05/15/2016 Document Reviewed: 08/15/2013 Elsevier Interactive Patient Education  2017 [employer]vier Inc.  

## 2018-04-07 ENCOUNTER — Other Ambulatory Visit: Payer: Self-pay | Admitting: Family Medicine

## 2018-04-08 ENCOUNTER — Other Ambulatory Visit: Payer: Self-pay | Admitting: *Deleted

## 2018-04-08 DIAGNOSIS — R634 Abnormal weight loss: Secondary | ICD-10-CM

## 2018-04-08 DIAGNOSIS — K50819 Crohn's disease of both small and large intestine with unspecified complications: Secondary | ICD-10-CM

## 2018-04-08 DIAGNOSIS — R1084 Generalized abdominal pain: Secondary | ICD-10-CM

## 2018-04-08 MED ORDER — BUDESONIDE 3 MG PO CPEP: 9.0000 mg | ORAL_CAPSULE | Freq: Every day | ORAL | 3 refills | Status: DC

## 2018-04-08 NOTE — Progress Notes (Signed)
[facility] Progress Note   Daniel Howard [id number] [birth year] 62 y.o.  Daniel Howard is managed by Dr. Alen Blew  Actively treated with chemotherapy: no  Current Therapy: androgen deprivation therapy   Assessment: Plan:    Muscle spasm - Plan: cyclobenzaprine (FLEXERIL) 5 MG tablet   Muscle spasms of the superior right SCM: Patient was given prescription for Flexeril 5 mg p.o. 3 times daily as needed for muscle spasms.  He was additionally instructed to use heat to the area, 20 minutes on and 20 minutes off.  Please see After Visit Summary for patient specific instructions.  Future Appointments  Date Time Provider [place]  04/14/2018  9:45 AM [facility] LAB 1 [facility] None  04/14/2018 10:00 AM [facility] FLUSH NURSE [facility] None  04/14/2018 10:30 AM [name], MD [facility] None  07/13/2018 10:15 AM [place], [name], MD [facility] None    No orders of the defined types were placed in this encounter.      Subjective:   Patient ID:  Daniel Howard is a 62 y.o. (DOB August 04, 1956) male.  Chief Complaint:  Chief Complaint  Patient presents with  . Neck Pain    HPI Daniel Howard is a 62 year old male with a history of a metastatic prostate cancer with diffuse pelvic adenopathy and bony metastatic disease.  He is currently treated with androgen deprivation therapy through his urologist.  He presents the office today with a report of recent right neck pain.  He denies any trauma or changes in activity.  Medications: I have reviewed the patient's current medications.  Allergies:  Allergies  Allergen Reactions  . Penicillins Hives    Has patient had a PCN reaction causing immediate rash, facial/tongue/throat swelling, SOB or lightheadedness with hypotension: Yes Has patient had a PCN reaction causing severe rash involving mucus membranes or skin necrosis: No Has patient had a PCN reaction that required hospitalization: No Has  patient had a PCN reaction occurring within the last 10 years: No If all of the above answers are "NO", then may proceed with Cephalosporin use.     Past Medical History:  Diagnosis Date  . Abnormal finding of biliary tract    MRCP shows pancreatic/biliary tract dilation. EUS 2010 confirmed dilation but no chronic pancreaitis or mass. Vascular ectasia crimpoing distal CBD.   [name] Anxiety   . 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 Duodenal ulcer 2010   nsaids  . History of blood transfusion 1992   "related to colon OR"  . Peristomal hernia   . Prostate cancer ([place]) 2018  . Small bowel obstruction ([place]) 10/2017; 11/21/2017; 02/16/2018  . Spigelian hernia    bilateral    Past Surgical History:  Procedure Laterality Date  . APPENDECTOMY  1992   at time of exp laparotomy at which time he was noted to have fistulizing Crohn's rather than UC  . COLON SURGERY    . COLONOSCOPY [name] 08/23/2014   OBS:JGGEZMO proctoscopy with possible fistulous opening in thebase of rectal/anal stump.    . COLOSTOMY  1992   transverse loop colostomy secondary to a stricture  . ESOPHAGOGASTRODUODENOSCOPY  05/2009   SLF: multiple antral erosions, large ulcer at  ansatomosis (postsurgical changes at duodenal bulb and second portion of duodenum) BX c/x NSAIDS.  . EUS  10/04/2009   Dr. Estill Bakes dilated CBD and main pancreatic duct.  No pancreatic  . EXPLORATORY LAPAROTOMY  1992  . FLEXIBLE SIGMOIDOSCOPY  1988   Dr. Laural Golden- suggested rohn's disease but the biopsies were not collaborative.  [name] FLEXIBLE SIGMOIDOSCOPY [name] 09/08/2016   Procedure: FLEXIBLE SIGMOIDOSCOPY;  Surgeon:  Wonda Horner, MD;  [facility] ENDOSCOPY;  Service: Gastroenterology;  Laterality: [name];  . HEMICOLOECTOMY W/ ANASTOMOSIS  1993   R- Dr.DeMason   . HERNIA REPAIR  1996   incarcerated periostial hernia with additional surgery in 1999  . IR FLUORO GUIDE PORT INSERTION RIGHT  04/03/2017  . IR US GUIDE VASC ACCESS RIGHT  04/03/2017    Family History  Problem Relation Age of Onset  . Cancer Father        prostate   . Prostate cancer Father   . Colon cancer Father 74  . Hypertension Sister   . Cancer Sister   . Depression Sister   . Breast cancer Sister   . COPD Sister   . Aneurysm Brother        deceased, brain aneurysm    Social History   Socioeconomic History  . Marital status: Married    Spouse name: Not on file  . Number of children: 51  . Years of education: Not on file  . Highest education level: Not on file  Occupational History  . Occupation: disabled    Fish farm manager: UNEMPLOYED  Social Needs  . Financial resource strain: Not on file  . Food insecurity:    Worry: Not on file    Inability: Not on file  . Transportation needs:    Medical: Not on file    Non-medical: Not on file  Tobacco Use  . Smoking status: Former Smoker    Packs/day: 0.75    Years: 32.00    Pack years: 24.00    Types: Cigarettes    Last attempt to quit: 12/21/2009    Years since quitting: 8.3  . Smokeless tobacco: Never Used  Substance and Sexual Activity  . Alcohol use: No    Alcohol/week: 0.0 oz    Comment: Former drinker  . Drug use: No  . Sexual activity: Never    Partners: Female  Lifestyle  . Physical activity:    Days per week: Not on file    Minutes per session: Not on file  . Stress: Not on file  Relationships  . Social connections:    Talks on phone: Not on file    Gets together: Not on file    Attends religious service: Not on file    Active member of club or organization: Not on file    Attends meetings of clubs or organizations: Not on file    Relationship status: Not  on file  . Intimate partner violence:    Fear of current or ex partner: Not on file    Emotionally abused: Not on file    Physically abused: Not on file    Forced sexual activity: Not on file  Other Topics Concern  . Not on file  Social History Narrative  . Not on file    Past Medical History, Surgical history, Social history, and Family history were reviewed and updated as appropriate.   Please see review of systems for further details on the patient's review from today.   Review of Systems:  Review of Systems  Constitutional: Negative for chills, diaphoresis and fever.  Respiratory: Negative for cough, choking, chest tightness, shortness of breath and wheezing.   Cardiovascular: Negative for chest pain, palpitations and leg swelling.  Musculoskeletal: Positive for neck pain. Negative for back pain, joint swelling, myalgias and neck stiffness.  Neurological: Negative for weakness and headaches.    Objective:   Physical Exam:  BP 122/66 (BP Location: Left Arm, Patient Position: Sitting)   Pulse 64   Temp 98.2 F (36.8 C) (Oral)   Resp 18   Ht 5' 2"  (1.575 m)   Wt 149 lb 6.4 oz (67.8 kg)   SpO2 99%   BMI 27.33 kg/m  ECOG: 0  Physical Exam  Constitutional: No distress.  HENT:  Head: Normocephalic and atraumatic.  Cardiovascular: Normal rate, regular rhythm and normal heart sounds. Exam reveals no gallop and no friction rub.  No murmur heard. Pulmonary/Chest: Effort normal and breath sounds normal. No respiratory distress. He has no wheezes. He has no rales.  Musculoskeletal: He exhibits tenderness.       Back:  Neurological: He is alert. He has normal strength. He displays no atrophy and no tremor. He exhibits normal muscle tone.  Skin: Skin is warm and dry. No rash noted. He is not diaphoretic. No erythema.    Lab Review:     Component Value Date/Time   NA 138 03/18/2018 0350   NA 137 11/20/2017 0931   K 3.9 03/18/2018 0350   K 3.3 (L) 11/20/2017 0931   CL  106 03/18/2018 0350   CO2 24 03/18/2018 0350   CO2 27 11/20/2017 0931   GLUCOSE 99 03/18/2018 0350   GLUCOSE 99 11/20/2017 0931   BUN 6 03/18/2018 0350   BUN 16.6 11/20/2017 0931   CREATININE 1.09 03/18/2018 0350   CREATININE 1.04 01/13/2018 1145   CREATININE 0.99 12/02/2017 1249   CREATININE 1.2 11/20/2017 0931   CALCIUM 8.6 (L) 03/18/2018 0350   CALCIUM 8.9 11/20/2017 0931   PROT 7.6 03/16/2018 1117   PROT 7.5 11/20/2017 0931   ALBUMIN 3.3 (L) 03/16/2018 1117   ALBUMIN 3.1 (L) 11/20/2017 0931   AST 24 03/16/2018 1117   AST 17 01/13/2018 1145   AST 38 (H) 11/20/2017 0931   ALT 17 03/16/2018 1117   ALT 17 01/13/2018 1145   ALT 45 11/20/2017 0931   ALKPHOS 53 03/16/2018 1117   ALKPHOS 62 11/20/2017 0931   BILITOT 1.2 03/16/2018 1117   BILITOT 0.4 01/13/2018 1145   BILITOT 0.66 11/20/2017 0931   GFRNONAA >60 03/18/2018 0350   GFRNONAA >60 01/13/2018 1145   GFRNONAA 52 (L) 02/11/2017 1640   GFRAA >60 03/18/2018 0350   GFRAA >60 01/13/2018 1145   GFRAA 60 02/11/2017 1640       Component Value Date/Time   WBC 4.5 03/18/2018 0350   RBC 3.26 (L) 03/18/2018 0350   HGB 10.3 (L) 03/18/2018 0350   HGB 10.6 (L) 11/20/2017 0931   HCT 31.0 (L) 03/18/2018 0350   HCT 32.5 (L) 11/20/2017 0931   PLT 259 03/18/2018 0350   PLT 274 01/13/2018 1145   PLT 377 11/20/2017 0931   MCV 95.1 03/18/2018 0350   MCV 91.0 11/20/2017 0931   MCH 31.6 03/18/2018 0350   MCHC 33.2 03/18/2018 0350   RDW 13.1 03/18/2018 0350   RDW 15.3 (H) 11/20/2017 0931   LYMPHSABS 2.4 01/13/2018 1145   LYMPHSABS 3.5 (H) 11/20/2017 0931   MONOABS 0.5 01/13/2018 1145   MONOABS 0.9 11/20/2017 0931   EOSABS 0.4  01/13/2018 1145   EOSABS 0.1 11/20/2017 0931   BASOSABS 0.0 01/13/2018 1145   BASOSABS 0.0 11/20/2017 0931   -------------------------------  Imaging from last 24 hours (if applicable):  Radiology interpretation: Ct Abdomen Pelvis W Contrast  Result Date: 03/16/2018 CLINICAL DATA:  62 year old male  with abdominal pain. Concern for gastroenteritis or colitis. History of Crohn's disease and hemicolectomy and colostomy. EXAM: CT ABDOMEN AND PELVIS WITH CONTRAST TECHNIQUE: Multidetector CT imaging of the abdomen and pelvis was performed using the standard protocol following bolus administration of intravenous contrast. CONTRAST:  136m ISOVUE-300 IOPAMIDOL (ISOVUE-300) INJECTION 61% COMPARISON:  Abdominal CT dated 02/16/2018 FINDINGS: Lower chest: Bibasilar linear atelectasis/scarring. The visualized lung bases are otherwise clear. No intra-abdominal free air or free fluid. Hepatobiliary: No focal liver abnormality is seen. No gallstones, gallbladder wall thickening, or biliary dilatation. Pancreas: Mild diffuse dilatation of the main pancreatic duct similar to prior CT. No mass or pancreatic atrophy. No evidence of active inflammatory changes. Spleen: A 12 mm enhancing focus in the inferior pole of the spleen is not well characterized but likely represents a hemangioma. Adrenals/Urinary Tract: The adrenal glands are unremarkable. There is no hydronephrosis on either side. There is symmetric enhancement and excretion of contrast by both kidneys. Layering calcific density within the bladder likely passed stones or bladder calculi. Stomach/Bowel: There is postsurgical changes of colectomy with a right lower quadrant ileostomy. There is a focal area of tethering and abutment of adjacent loops of small bowel to the anterior pelvic wall and rectus sheath (series 3, image 57) compatible with adhesions. Additional areas of adhesions noted superiorly (series 3 image 40). There is a focal area of transition in the pelvis (series 3, image 56) with dilatation of the small bowel loops proximally compatible with obstruction. Additional transition point may be present secondary to adhesions. There is mild thickening and inflammatory changes of the small bowel loops in the lower abdomen and pelvis in the region of the adhesion,  most likely reactive. A 4 cm defect in the left anterior abdominal wall with herniation of loops of small bowel appears similar to prior CT. No definite evidence of obstruction at this site. Vascular/Lymphatic: The abdominal aorta and IVC appear unremarkable. There is a retroaortic left renal vein anatomy. No portal venous gas. Multiple enlarged lymph nodes in the right lower quadrant measure up to 14 mm in short axis not significantly changed since the prior CT. Reproductive: Lobulated soft tissue mass extending from the prostate gland posteriorly to the rectum and pelvic sidewall similar to prior CT. Other: Bilateral gluteal nodular lesions measure up to 3.3 x 2.0 cm on the right most consistent with enlarged and abnormal lymph nodes. Musculoskeletal: Sclerotic lesions primarily involving the pelvic bone as well as right femur similar to prior CT consistent with metastatic disease. IMPRESSION: 1. Small-bowel obstruction with transition zone in the right hemipelvis secondary to adhesions. There is mild associated inflammatory changes and thickening of the small bowel loops, likely reactive. 2. Additional nonacute findings as described above and similar to prior CT including left spigelian hernia, prostate mass, right lower quadrant and bilateral gluteal adenopathy, and osseous metastatic disease. Electronically Signed   By: AAnner CreteM.D.   On: 03/16/2018 21:48   Dg Abd 2 Views  Result Date: 03/17/2018 CLINICAL DATA:  Mid abdominal cramping and diarrhea. History of Crohn's disease with previous colostomy creation. EXAM: ABDOMEN - 2 VIEW COMPARISON:  Abdominal and pelvic CT scan of March 16, 2018 FINDINGS: There remain loops of mildly distended  gas and fluid-filled small bowel within the mid and lower abdomen. There is no significant colonic gas. There is a hernia in the inferior left flank with air and fluid levels noted within bowel. There are no abnormal soft tissue calcifications. The bony structures  exhibit no acute abnormality. There is contrast in the urinary bladder from the previous CT scan. IMPRESSION: Findings consistent with a persistent mid to distal small bowel obstruction. The degree of gaseous distention of the small bowel has decreased somewhat since yesterday's CT scan. Electronically Signed   By: David  [place] M.D.   On: 03/17/2018 10:06   Dg Abd Portable 1v  Result Date: 03/18/2018 CLINICAL DATA:  Small bowel obstruction. EXAM: PORTABLE ABDOMEN - 1 VIEW COMPARISON:  03/17/2018 radiographs, 03/16/2018 CT and prior studies FINDINGS: No significant change in bowel gas pattern with a few scattered mildly distended gas-filled small bowel loops again noted. No other significant abnormalities are identified. IMPRESSION: Unchanged appearance of bowel gas pattern with mildly distended gas-filled small bowel loops again noted which may represent persistent small-bowel obstruction. Electronically Signed   By: Margarette Canada M.D.   On: 03/18/2018 08:33

## 2018-04-14 ENCOUNTER — Inpatient Hospital Stay: Payer: Medicare Other

## 2018-04-14 ENCOUNTER — Inpatient Hospital Stay (HOSPITAL_BASED_OUTPATIENT_CLINIC_OR_DEPARTMENT_OTHER): Payer: Medicare Other | Admitting: Oncology

## 2018-04-14 ENCOUNTER — Telehealth: Payer: Self-pay | Admitting: Oncology

## 2018-04-14 VITALS — BP 117/74 | HR 65 | Temp 98.2°F | Resp 19 | Ht 62.0 in | Wt 149.5 lb

## 2018-04-14 DIAGNOSIS — K5090 Crohn's disease, unspecified, without complications: Secondary | ICD-10-CM

## 2018-04-14 DIAGNOSIS — C61 Malignant neoplasm of prostate: Secondary | ICD-10-CM | POA: Diagnosis not present

## 2018-04-14 DIAGNOSIS — Z87891 Personal history of nicotine dependence: Secondary | ICD-10-CM | POA: Diagnosis not present

## 2018-04-14 DIAGNOSIS — R590 Localized enlarged lymph nodes: Secondary | ICD-10-CM | POA: Diagnosis not present

## 2018-04-14 DIAGNOSIS — Z79899 Other long term (current) drug therapy: Secondary | ICD-10-CM

## 2018-04-14 DIAGNOSIS — M542 Cervicalgia: Secondary | ICD-10-CM | POA: Diagnosis not present

## 2018-04-14 DIAGNOSIS — C7951 Secondary malignant neoplasm of bone: Secondary | ICD-10-CM | POA: Diagnosis not present

## 2018-04-14 DIAGNOSIS — M62838 Other muscle spasm: Secondary | ICD-10-CM | POA: Diagnosis not present

## 2018-04-14 LAB — CBC WITH DIFFERENTIAL (CANCER CENTER ONLY)
BASOS PCT: 1 %
Basophils Absolute: 0.1 10*3/uL (ref 0.0–0.1)
EOS ABS: 0.3 10*3/uL (ref 0.0–0.5)
EOS PCT: 4 %
HCT: 31.7 % — ABNORMAL LOW (ref 38.4–49.9)
HEMOGLOBIN: 10.7 g/dL — AB (ref 13.0–17.1)
Lymphocytes Relative: 30 %
Lymphs Abs: 1.9 10*3/uL (ref 0.9–3.3)
MCH: 32.6 pg (ref 27.2–33.4)
MCHC: 33.7 g/dL (ref 32.0–36.0)
MCV: 96.7 fL (ref 79.3–98.0)
MONOS PCT: 7 %
Monocytes Absolute: 0.4 10*3/uL (ref 0.1–0.9)
NEUTROS PCT: 58 %
Neutro Abs: 3.5 10*3/uL (ref 1.5–6.5)
PLATELETS: 277 10*3/uL (ref 140–400)
RBC: 3.28 MIL/uL — ABNORMAL LOW (ref 4.20–5.82)
RDW: 14.3 % (ref 11.0–14.6)
WBC Count: 6.1 10*3/uL (ref 4.0–10.3)

## 2018-04-14 LAB — CMP (CANCER CENTER ONLY)
ALBUMIN: 3.1 g/dL — AB (ref 3.5–5.0)
ALT: 11 U/L (ref 0–55)
AST: 16 U/L (ref 5–34)
Alkaline Phosphatase: 56 U/L (ref 40–150)
Anion gap: 11 (ref 3–11)
BUN: 13 mg/dL (ref 7–26)
CHLORIDE: 106 mmol/L (ref 98–109)
CO2: 24 mmol/L (ref 22–29)
Calcium: 9.1 mg/dL (ref 8.4–10.4)
Creatinine: 1.1 mg/dL (ref 0.70–1.30)
GFR, Estimated: >60 mL/min (ref >60)
GLUCOSE: 100 mg/dL (ref 70–140)
POTASSIUM: 3.8 mmol/L (ref 3.5–5.1)
SODIUM: 141 mmol/L (ref 136–145)
Total Bilirubin: 0.4 mg/dL (ref 0.2–1.2)
Total Protein: 7 g/dL (ref 6.4–8.3)

## 2018-04-14 MED ORDER — SODIUM CHLORIDE 0.9% FLUSH
10.0000 mL | INTRAVENOUS | Status: DC | PRN
  Administered 2018-04-14: 10 mL
  Filled 2018-04-14: qty 10

## 2018-04-14 MED ORDER — HEPARIN SOD (PORK) LOCK FLUSH 100 UNIT/ML IV SOLN
500.0000 [IU] | Freq: Once | INTRAVENOUS | Status: DC | PRN
  Filled 2018-04-14: qty 5

## 2018-04-14 NOTE — Telephone Encounter (Signed)
Scheduled appt per 4/24 los - Gave patient aVS and calender per los.

## 2018-04-14 NOTE — Patient Instructions (Signed)

## 2018-04-14 NOTE — Progress Notes (Signed)
Hematology and Oncology Follow Up Visit  Daniel Howard [id number] 10-01-1956 62 y.o. 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 His PSA was 31.3 at the time of diagnosis with unknown Gleason score.  Prior Therapy:   He is status post biopsy on 03/16/2017 of a pelvic mass which confirmed the presence of prostate cancer.  Taxotere chemotherapy at 75 mg/m started on 04/24/2017. He S/P 6 cycles  of therapy completed in August 2018.  Current therapy:  Androgen deprivation therapy ongoing at [facility].    Interim History: [name] returns today for a follow-up visit.  He reports feeling reasonably well without any recent issues.  He was hospitalized in March 2019 for small bowel obstruction of the symptoms have resolved at this time.  Is able to eat well and maintain his weight.  He denies any abdominal pain or distention.  He denied bone pain or pathological fractures.  His performance status and activity level remain unchanged.  He does not report any headaches, blurry vision, syncope or seizures. He does not report any fevers, chills, sweats or weight loss. He does not report any chest pain, palpitation, orthopnea or leg edema. He does not report any cough, wheezing or hemoptysis. He does not report any nausea, vomiting or abdominal pain. He does not report any frequency urgency or hesitancy. He is not report any skeletal complaints of arthralgias or myalgias.  He does not report any skin rashes or lesions.  He denies any anxiety or depression.  Remaining review of systems is negative  Medications: I have reviewed the patient's current medications.  Current Outpatient Medications  Medication Sig Dispense Refill  . budesonide (ENTOCORT EC) 3 MG 24 hr capsule Take 3 capsules (9 mg total) by mouth daily. 90 capsule 3  .  calcium carbonate (OS-CAL) 600 MG TABS tablet Take 1,200 mg by mouth daily with breakfast.    . cyclobenzaprine (FLEXERIL) 5 MG tablet Take 1 tablet (5 mg total) by mouth 3 (three) times daily as needed for muscle spasms. 30 tablet 0  . Ferrous Sulfate 28 MG TABS Take 1 tablet by mouth daily.    [name] HYDROcodone-acetaminophen (NORCO/VICODIN) 5-325 MG tablet Take 1 tablet by mouth every 6 (six) hours as needed for moderate pain. 60 tablet 0  . lidocaine-prilocaine (EMLA) cream Apply 1 application topically as needed (when accessing port).    . mirtazapine (REMERON) 15 MG tablet TAKE 1 TABLET BY MOUTH AT BEDTIME 90 tablet 1  . Multiple Vitamins-Minerals (CENTRUM SILVER ADULT 50+) TABS Take 1 tablet by mouth daily.    [name] triamcinolone ointment (KENALOG) 0.5 % Apply 1 application topically 2 (two) times daily. 30 g 0   No current facility-administered medications for this visit.    Facility-Administered Medications Ordered in Other Visits  Medication Dose Route Frequency Provider Last Rate Last Dose  . sodium chloride flush (NS) 0.9 % injection 10 mL  10 mL Intravenous PRN [name], MD   10 mL at 11/20/17 3300     Allergies:  Allergies  Allergen Reactions  . Penicillins Hives    Has patient had a PCN reaction causing immediate rash, facial/tongue/throat swelling, SOB or lightheadedness with hypotension: Yes Has patient had a PCN reaction causing severe rash involving mucus membranes or skin necrosis: No Has patient had a PCN reaction that required hospitalization: No Has patient had a  PCN reaction occurring within the last 10 years: No If all of the above answers are "NO", then may proceed with Cephalosporin use.     Past Medical History, Surgical history, Social history, and Family History is unchanged after reviewing it today.  Physical Exam: Blood pressure 117/74, pulse 65, temperature 98.2 F (36.8 C), temperature source Oral, resp. rate 19, height 5' 2"  (1.575 m), weight 149 lb 8  oz (67.8 kg), SpO2 100 %. ECOG: 1 General appearance: Comfortable appearing gentleman without distress. Head: Atraumatic without abnormalities. Oral mucosa without any ulcers or thrush. Eyes: Pupils are equal and round reactive to light. Lymph nodes: No lymphadenopathy palpated cervical, supraclavicular, and axillary nodes  Heart: Regular rate and rhythm without any murmurs or gallops. Lung:chest normal lung fields without any wheezes or dullness to percussion. Abdomin: Soft, nontender without any rebound or guarding. Skeletal: No clubbing or cyanosis. Neurological: No motor or sensory deficits.   Lab Results: Lab Results  Component Value Date   WBC 6.1 04/14/2018   HGB 10.7 (L) 04/14/2018   HCT 31.7 (L) 04/14/2018   MCV 96.7 04/14/2018   PLT 277 04/14/2018     Chemistry      Component Value Date/Time   NA 141 04/14/2018 1010   NA 137 11/20/2017 0931   K 3.8 04/14/2018 1010   K 3.3 (L) 11/20/2017 0931   CL 106 04/14/2018 1010   CO2 24 04/14/2018 1010   CO2 27 11/20/2017 0931   BUN 13 04/14/2018 1010   BUN 16.6 11/20/2017 0931   CREATININE 1.10 04/14/2018 1010   CREATININE 0.99 12/02/2017 1249   CREATININE 1.2 11/20/2017 0931      Component Value Date/Time   CALCIUM 9.1 04/14/2018 1010   CALCIUM 8.9 11/20/2017 0931   ALKPHOS 56 04/14/2018 1010   ALKPHOS 62 11/20/2017 0931   AST 16 04/14/2018 1010   AST 38 (H) 11/20/2017 0931   ALT 11 04/14/2018 1010   ALT 45 11/20/2017 0931   BILITOT 0.4 04/14/2018 1010   BILITOT 0.66 11/20/2017 0931      [name], Daniel Howard (MRN [id number]) as of 04/14/2018 11:34  Ref. Range 11/20/2017 09:31 01/13/2018 11:45  Prostate Specific Ag, Serum Latest Ref Range: 0.0 - 4.0 ng/mL 0.8 0.3    EXAM: CT ABDOMEN AND PELVIS WITH CONTRAST  TECHNIQUE: Multidetector CT imaging of the abdomen and pelvis was performed using the standard protocol following bolus administration of intravenous contrast.  CONTRAST:  127m ISOVUE-300  IOPAMIDOL (ISOVUE-300) INJECTION 61%  COMPARISON:  Abdominal CT dated 02/16/2018  FINDINGS: Lower chest: Bibasilar linear atelectasis/scarring. The visualized lung bases are otherwise clear.  No intra-abdominal free air or free fluid.  Hepatobiliary: No focal liver abnormality is seen. No gallstones, gallbladder wall thickening, or biliary dilatation.  Pancreas: Mild diffuse dilatation of the main pancreatic duct similar to prior CT. No mass or pancreatic atrophy. No evidence of active inflammatory changes.  Spleen: A 12 mm enhancing focus in the inferior pole of the spleen is not well characterized but likely represents a hemangioma.  Adrenals/Urinary Tract: The adrenal glands are unremarkable. There is no hydronephrosis on either side. There is symmetric enhancement and excretion of contrast by both kidneys. Layering calcific density within the bladder likely passed stones or bladder calculi.  Stomach/Bowel: There is postsurgical changes of colectomy with a right lower quadrant ileostomy. There is a focal area of tethering and abutment of adjacent loops of small bowel to the anterior pelvic wall and rectus sheath (series 3, image  57) compatible with adhesions. Additional areas of adhesions noted superiorly (series 3 image 40). There is a focal area of transition in the pelvis (series 3, image 56) with dilatation of the small bowel loops proximally compatible with obstruction. Additional transition point may be present secondary to adhesions. There is mild thickening and inflammatory changes of the small bowel loops in the lower abdomen and pelvis in the region of the adhesion, most likely reactive. A 4 cm defect in the left anterior abdominal wall with herniation of loops of small bowel appears similar to prior CT. No definite evidence of obstruction at this site.  Vascular/Lymphatic: The abdominal aorta and IVC appear unremarkable. There is a retroaortic left  renal vein anatomy. No portal venous gas. Multiple enlarged lymph nodes in the right lower quadrant measure up to 14 mm in short axis not significantly changed since the prior CT.  Reproductive: Lobulated soft tissue mass extending from the prostate gland posteriorly to the rectum and pelvic sidewall similar to prior CT.  Other: Bilateral gluteal nodular lesions measure up to 3.3 x 2.0 cm on the right most consistent with enlarged and abnormal lymph nodes.  Musculoskeletal: Sclerotic lesions primarily involving the pelvic bone as well as right femur similar to prior CT consistent with metastatic disease.  IMPRESSION: 1. Small-bowel obstruction with transition zone in the right hemipelvis secondary to adhesions. There is mild associated inflammatory changes and thickening of the small bowel loops, likely reactive. 2. Additional nonacute findings as described above and similar to prior CT including left spigelian hernia, prostate mass, right lower quadrant and bilateral gluteal adenopathy, and osseous metastatic disease.   Impression and Plan:   62 year old gentleman with the following issues:  1.  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     He is S/P Taxotere chemotherapy at 75 mg/m every 3 weeks for a total of 6 cycles completed in August 2018 with a PSA nadir of 0.3.  His CT scan obtained on March 16, 2018 was personally reviewed and continues to show stable disease.  His PSA in January 2019 remains at 0.3.  The natural course of this disease was reviewed today as well as different treatment options.  At this time given his disease is under excellent control and will defer adding additional therapy.  Zytiga or [name] would be an option if his PSA starts to rise again.   2. IV access: Port-A-Cath for 6 weeks.  3.  Crohn's disease: No exacerbation noted recently.   4. Bone directed therapy: He will be a candidate  for Xgeva in the future after obtaining dental clearance.  5. Androgen deprivation therapy: I urged him to continue to receive that indefinitely and he does so at [facility].  6. Follow-up: Will be in 3 months.  15 minutes were spent face-to-face with the patient today.  More than 50% were spent on counseling, education and answering questions regarding his future plan of care.    Zola Button, MD 4/24/201911:31 AM

## 2018-04-15 ENCOUNTER — Telehealth: Payer: Self-pay | Admitting: *Deleted

## 2018-04-15 LAB — PROSTATE-SPECIFIC AG, SERUM (LABCORP): Prostate Specific Ag, Serum: 0.2 ng/mL (ref 0.0–4.0)

## 2018-04-15 NOTE — Telephone Encounter (Signed)
-----   Message from [name], MD sent at 04/15/2018  8:21 AM EDT ----- Please let him know his PSA still low

## 2018-04-15 NOTE — Telephone Encounter (Signed)
As noted below by Dr. Alen Blew, I informed wife of his PSA level. She verbalized understanding.

## 2018-04-16 ENCOUNTER — Inpatient Hospital Stay (HOSPITAL_COMMUNITY)
Admission: EM | Admit: 2018-04-16 | Discharge: 2018-04-19 | DRG: 385 | Disposition: A | Discharge status: Home / Self Care | Payer: Medicare Other | Attending: Internal Medicine | Admitting: Internal Medicine

## 2018-04-16 ENCOUNTER — Other Ambulatory Visit: Payer: Self-pay

## 2018-04-16 ENCOUNTER — Encounter (HOSPITAL_COMMUNITY): Payer: Self-pay | Admitting: *Deleted

## 2018-04-16 DIAGNOSIS — D638 Anemia in other chronic diseases classified elsewhere: Secondary | ICD-10-CM | POA: Diagnosis present

## 2018-04-16 DIAGNOSIS — D649 Anemia, unspecified: Secondary | ICD-10-CM

## 2018-04-16 DIAGNOSIS — F419 Anxiety disorder, unspecified: Secondary | ICD-10-CM | POA: Diagnosis present

## 2018-04-16 DIAGNOSIS — E43 Unspecified severe protein-calorie malnutrition: Secondary | ICD-10-CM | POA: Diagnosis present

## 2018-04-16 DIAGNOSIS — K50812 Crohn's disease of both small and large intestine with intestinal obstruction: Secondary | ICD-10-CM | POA: Diagnosis not present

## 2018-04-16 DIAGNOSIS — Z87891 Personal history of nicotine dependence: Secondary | ICD-10-CM

## 2018-04-16 DIAGNOSIS — K56609 Unspecified intestinal obstruction, unspecified as to partial versus complete obstruction: Secondary | ICD-10-CM | POA: Diagnosis not present

## 2018-04-16 DIAGNOSIS — Z8711 Personal history of peptic ulcer disease: Secondary | ICD-10-CM | POA: Diagnosis not present

## 2018-04-16 DIAGNOSIS — Z95828 Presence of other vascular implants and grafts: Secondary | ICD-10-CM | POA: Diagnosis not present

## 2018-04-16 DIAGNOSIS — Z79899 Other long term (current) drug therapy: Secondary | ICD-10-CM

## 2018-04-16 DIAGNOSIS — R1110 Vomiting, unspecified: Secondary | ICD-10-CM | POA: Diagnosis not present

## 2018-04-16 DIAGNOSIS — K439 Ventral hernia without obstruction or gangrene: Secondary | ICD-10-CM | POA: Diagnosis present

## 2018-04-16 DIAGNOSIS — Z9049 Acquired absence of other specified parts of digestive tract: Secondary | ICD-10-CM

## 2018-04-16 DIAGNOSIS — D630 Anemia in neoplastic disease: Secondary | ICD-10-CM | POA: Diagnosis not present

## 2018-04-16 DIAGNOSIS — K5090 Crohn's disease, unspecified, without complications: Secondary | ICD-10-CM | POA: Diagnosis not present

## 2018-04-16 DIAGNOSIS — K50819 Crohn's disease of both small and large intestine with unspecified complications: Secondary | ICD-10-CM | POA: Diagnosis present

## 2018-04-16 DIAGNOSIS — Z7952 Long term (current) use of systemic steroids: Secondary | ICD-10-CM

## 2018-04-16 DIAGNOSIS — Z880 Allergy status to penicillin: Secondary | ICD-10-CM | POA: Diagnosis not present

## 2018-04-16 DIAGNOSIS — C7951 Secondary malignant neoplasm of bone: Secondary | ICD-10-CM | POA: Diagnosis present

## 2018-04-16 DIAGNOSIS — K50012 Crohn's disease of small intestine with intestinal obstruction: Secondary | ICD-10-CM | POA: Diagnosis not present

## 2018-04-16 DIAGNOSIS — R110 Nausea: Secondary | ICD-10-CM

## 2018-04-16 DIAGNOSIS — C61 Malignant neoplasm of prostate: Secondary | ICD-10-CM | POA: Diagnosis not present

## 2018-04-16 DIAGNOSIS — Z933 Colostomy status: Secondary | ICD-10-CM

## 2018-04-16 DIAGNOSIS — R109 Unspecified abdominal pain: Secondary | ICD-10-CM | POA: Diagnosis not present

## 2018-04-16 DIAGNOSIS — Z6825 Body mass index (BMI) 25.0-25.9, adult: Secondary | ICD-10-CM

## 2018-04-16 DIAGNOSIS — D530 Protein deficiency anemia: Secondary | ICD-10-CM | POA: Diagnosis not present

## 2018-04-16 DIAGNOSIS — R197 Diarrhea, unspecified: Secondary | ICD-10-CM | POA: Diagnosis not present

## 2018-04-16 LAB — URINALYSIS, ROUTINE W REFLEX MICROSCOPIC
Bilirubin Urine: NEGATIVE
GLUCOSE, UA: NEGATIVE mg/dL
HGB URINE DIPSTICK: NEGATIVE
Ketones, ur: NEGATIVE mg/dL
Leukocytes, UA: NEGATIVE
Nitrite: NEGATIVE
PROTEIN: NEGATIVE mg/dL
Specific Gravity, Urine: 1.025 (ref 1.005–1.030)
pH: 5 (ref 5.0–8.0)

## 2018-04-16 LAB — CBC
HCT: 35.2 % — ABNORMAL LOW (ref 39.0–52.0)
Hemoglobin: 11.6 g/dL — ABNORMAL LOW (ref 13.0–17.0)
MCH: 32 pg (ref 26.0–34.0)
MCHC: 33 g/dL (ref 30.0–36.0)
MCV: 97 fL (ref 78.0–100.0)
PLATELETS: 300 10*3/uL (ref 150–400)
RBC: 3.63 MIL/uL — AB (ref 4.22–5.81)
RDW: 13.9 % (ref 11.5–15.5)
WBC: 8.9 10*3/uL (ref 4.0–10.5)

## 2018-04-16 LAB — COMPREHENSIVE METABOLIC PANEL
ALT: 14 U/L — AB (ref 17–63)
AST: 23 U/L (ref 15–41)
Albumin: 3.4 g/dL — ABNORMAL LOW (ref 3.5–5.0)
Alkaline Phosphatase: 58 U/L (ref 38–126)
Anion gap: 9 (ref 5–15)
BUN: 15 mg/dL (ref 6–20)
CALCIUM: 9.2 mg/dL (ref 8.9–10.3)
CHLORIDE: 102 mmol/L (ref 101–111)
CO2: 26 mmol/L (ref 22–32)
CREATININE: 1.22 mg/dL (ref 0.61–1.24)
Glucose, Bld: 95 mg/dL (ref 65–99)
Potassium: 4.3 mmol/L (ref 3.5–5.1)
Sodium: 137 mmol/L (ref 135–145)
Total Bilirubin: 1 mg/dL (ref 0.3–1.2)
Total Protein: 7.5 g/dL (ref 6.5–8.1)

## 2018-04-16 LAB — LIPASE, BLOOD: Lipase: 31 U/L (ref 11–51)

## 2018-04-16 NOTE — ED Triage Notes (Signed)
The pt is c/o abd pain with n v and diarrhea for 3-4 days no pain at present

## 2018-04-16 NOTE — ED Notes (Signed)
Pt called for triage x3 no answer

## 2018-04-17 ENCOUNTER — Emergency Department (HOSPITAL_COMMUNITY): Payer: Medicare Other

## 2018-04-17 DIAGNOSIS — D630 Anemia in neoplastic disease: Secondary | ICD-10-CM | POA: Diagnosis present

## 2018-04-17 DIAGNOSIS — R110 Nausea: Secondary | ICD-10-CM | POA: Diagnosis not present

## 2018-04-17 DIAGNOSIS — Z95828 Presence of other vascular implants and grafts: Secondary | ICD-10-CM | POA: Diagnosis not present

## 2018-04-17 DIAGNOSIS — K50812 Crohn's disease of both small and large intestine with intestinal obstruction: Principal | ICD-10-CM

## 2018-04-17 DIAGNOSIS — Z933 Colostomy status: Secondary | ICD-10-CM | POA: Diagnosis not present

## 2018-04-17 DIAGNOSIS — Z880 Allergy status to penicillin: Secondary | ICD-10-CM | POA: Diagnosis not present

## 2018-04-17 DIAGNOSIS — R197 Diarrhea, unspecified: Secondary | ICD-10-CM | POA: Diagnosis not present

## 2018-04-17 DIAGNOSIS — K439 Ventral hernia without obstruction or gangrene: Secondary | ICD-10-CM | POA: Diagnosis present

## 2018-04-17 DIAGNOSIS — C7951 Secondary malignant neoplasm of bone: Secondary | ICD-10-CM | POA: Diagnosis present

## 2018-04-17 DIAGNOSIS — R1110 Vomiting, unspecified: Secondary | ICD-10-CM | POA: Diagnosis not present

## 2018-04-17 DIAGNOSIS — D649 Anemia, unspecified: Secondary | ICD-10-CM | POA: Diagnosis not present

## 2018-04-17 DIAGNOSIS — Z9049 Acquired absence of other specified parts of digestive tract: Secondary | ICD-10-CM | POA: Diagnosis not present

## 2018-04-17 DIAGNOSIS — E43 Unspecified severe protein-calorie malnutrition: Secondary | ICD-10-CM | POA: Diagnosis present

## 2018-04-17 DIAGNOSIS — K50012 Crohn's disease of small intestine with intestinal obstruction: Secondary | ICD-10-CM | POA: Diagnosis not present

## 2018-04-17 DIAGNOSIS — R109 Unspecified abdominal pain: Secondary | ICD-10-CM | POA: Diagnosis not present

## 2018-04-17 DIAGNOSIS — K50819 Crohn's disease of both small and large intestine with unspecified complications: Secondary | ICD-10-CM | POA: Diagnosis not present

## 2018-04-17 DIAGNOSIS — K5090 Crohn's disease, unspecified, without complications: Secondary | ICD-10-CM | POA: Diagnosis not present

## 2018-04-17 DIAGNOSIS — K5669 Other partial intestinal obstruction: Secondary | ICD-10-CM | POA: Diagnosis not present

## 2018-04-17 DIAGNOSIS — F419 Anxiety disorder, unspecified: Secondary | ICD-10-CM | POA: Diagnosis present

## 2018-04-17 DIAGNOSIS — Z87891 Personal history of nicotine dependence: Secondary | ICD-10-CM | POA: Diagnosis not present

## 2018-04-17 DIAGNOSIS — Z6825 Body mass index (BMI) 25.0-25.9, adult: Secondary | ICD-10-CM | POA: Diagnosis not present

## 2018-04-17 DIAGNOSIS — D638 Anemia in other chronic diseases classified elsewhere: Secondary | ICD-10-CM | POA: Diagnosis present

## 2018-04-17 DIAGNOSIS — C61 Malignant neoplasm of prostate: Secondary | ICD-10-CM | POA: Diagnosis present

## 2018-04-17 DIAGNOSIS — Z8711 Personal history of peptic ulcer disease: Secondary | ICD-10-CM | POA: Diagnosis not present

## 2018-04-17 DIAGNOSIS — D530 Protein deficiency anemia: Secondary | ICD-10-CM | POA: Diagnosis not present

## 2018-04-17 DIAGNOSIS — Z7952 Long term (current) use of systemic steroids: Secondary | ICD-10-CM | POA: Diagnosis not present

## 2018-04-17 DIAGNOSIS — K56609 Unspecified intestinal obstruction, unspecified as to partial versus complete obstruction: Secondary | ICD-10-CM | POA: Diagnosis not present

## 2018-04-17 DIAGNOSIS — Z79899 Other long term (current) drug therapy: Secondary | ICD-10-CM | POA: Diagnosis not present

## 2018-04-17 MED ORDER — ONDANSETRON HCL 4 MG PO TABS: 4.0000 mg | ORAL_TABLET | Freq: Four times a day (QID) | ORAL | Status: DC | PRN

## 2018-04-17 MED ORDER — ONDANSETRON HCL 4 MG/2ML IJ SOLN: 4.0000 mg | Freq: Four times a day (QID) | INTRAMUSCULAR | Status: DC | PRN

## 2018-04-17 MED ORDER — SODIUM CHLORIDE 0.9 % IV SOLN
INTRAVENOUS | Status: DC
  Administered 2018-04-17: 03:00:00 via INTRAVENOUS

## 2018-04-17 MED ORDER — MORPHINE SULFATE (PF) 4 MG/ML IV SOLN
4.0000 mg | Freq: Once | INTRAVENOUS | Status: AC
  Administered 2018-04-17: 4 mg via INTRAVENOUS
  Filled 2018-04-17: qty 1

## 2018-04-17 MED ORDER — IOPAMIDOL (ISOVUE-300) INJECTION 61%
INTRAVENOUS | Status: AC
  Filled 2018-04-17: qty 30

## 2018-04-17 MED ORDER — ACETAMINOPHEN 325 MG PO TABS: 650.0000 mg | ORAL_TABLET | Freq: Four times a day (QID) | ORAL | Status: DC | PRN

## 2018-04-17 MED ORDER — ACETAMINOPHEN 650 MG RE SUPP: 650.0000 mg | Freq: Four times a day (QID) | RECTAL | Status: DC | PRN

## 2018-04-17 MED ORDER — IOPAMIDOL (ISOVUE-300) INJECTION 61%
INTRAVENOUS | Status: AC
  Filled 2018-04-17: qty 100

## 2018-04-17 MED ORDER — METHYLPREDNISOLONE SODIUM SUCC 40 MG IJ SOLR
15.0000 mg | Freq: Three times a day (TID) | INTRAMUSCULAR | Status: DC
  Administered 2018-04-17 – 2018-04-19 (×6): 15.2 mg via INTRAVENOUS
  Filled 2018-04-17 (×6): qty 1

## 2018-04-17 MED ORDER — SENNOSIDES-DOCUSATE SODIUM 8.6-50 MG PO TABS: 1.0000 | ORAL_TABLET | Freq: Every evening | ORAL | Status: DC | PRN

## 2018-04-17 MED ORDER — IOPAMIDOL (ISOVUE-300) INJECTION 61%
100.0000 mL | Freq: Once | INTRAVENOUS | Status: AC | PRN
  Administered 2018-04-17: 100 mL via INTRAVENOUS

## 2018-04-17 MED ORDER — MORPHINE SULFATE (PF) 4 MG/ML IV SOLN: 1.0000 mg | INTRAVENOUS | Status: DC | PRN

## 2018-04-17 MED ORDER — SODIUM CHLORIDE 0.9 % IV SOLN
INTRAVENOUS | Status: DC
  Administered 2018-04-17 – 2018-04-18 (×3): via INTRAVENOUS

## 2018-04-17 MED ORDER — BISACODYL 10 MG RE SUPP: 10.0000 mg | Freq: Every day | RECTAL | Status: DC | PRN

## 2018-04-17 NOTE — Consult Note (Addendum)
Consultation  Referring Provider: Dr. Maudie Mercury     Primary Care Physician:  Alycia Rossetti, MD Primary Gastroenterologist: Dr. Gala Romney       Reason for Consultation: Crohn's disease, small bowel obstruction             HPI:   Daniel Howard is a 62 y.o. 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 Gala Romney.      Recent admission 03/16/2018-03/19/2018 for abdominal pain and diarrhea.  Previous to this patient been admitted a month ago with small bowel obstruction which improved rapidly with conservative management and remain in his usual state until he returned.  During admission he was treated conservatively with IV fluids, n.p.o. and improved, his small bowel obstruction completely resolved, diet advanced and he was sent home with Entocort (9 mg daily).    Today, presents to the ED with his wife and family friend, explains that he was feeling fairly well until yesterday morning when he was eating some candy and started to feel unwell, throughout the day he remained with some vague abdominal pain and cramping which worsened after around 4 PM when he had some baked chicken.  Also with loose stool from his colostomy increasing over the past 24 hours.  Rates cramping as a 10/10 when it would occur.  Currently not in any abdominal pain.  He does describe being thirsty and wants to "suck on some ice chips".    Was taking Entocort 6 mg daily at home as well as his Humira.  Wife describes they follow with Dr. Gala Romney, did see [facility] 3 years ago who suggested further surgery but "Dr. Gala Romney explained that he can have no further surgery due to further stricturing", per patient's wife.    Denies fever, chills or blood in his stool.  ED course: CT abdomen pelvis with similar high-grade small bowel obstruction with transition point in the region of thickened  small bowel loops just proximal to right lower quadrant ileostomy, most consistent with active Crohn's disease.  No evidence of abscess abscess or bowel perforation.  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  Previous GI history: -He is status post remote ileocecal colectomy, permanent ostomy, surveillance of remaining colon has been problematic due to rectal stricture, had been evaluated at [facility] in the past and a complete colectomy recommended.  Over the years has been treated with Imuran, azathioprine and Lialda, started Humira in early 2017 and initially did well. -02/02/2018 office visit Dr. Gala Romney: Discussed that he be maintained on Entocort more or less chronically as other available therapies were not appropriate, he had run out of Entocort a couple of weeks before, ostomy was functioning well, no abdominal pain, nausea or vomiting, was given repeat Entocort course at 6 mg daily -09/08/2016 flex sig Dr. Sherwood Gambler stricture on digital rectal exam, surgical rectal pouch with no mass or tumor.  Pouch was friable.  Past Medical History:  Diagnosis Date  . Abnormal finding of biliary tract    MRCP shows pancreatic/biliary tract dilation. EUS 2010 confirmed dilation but no chronic pancreaitis or mass. Vascular ectasia crimpoing distal CBD.   [name] Anxiety   . Crohn's 1982  initially treated for UC first 9-10 years but at time of exploratory laparotomy with incidental appendectomy in 1992 he was noted to have multiple fistulas involving rectosigmoid colon with sigmoid stricture.s/p transverse loop colostomy secondary to stricture 1992., followed by end-transverse ostomy, followed by right hemicolectomy, followed  by takedown & ileostomy  .  Duodenal ulcer 2010   nsaids  . History of blood transfusion 1992   "related to colon OR"  . Peristomal hernia   . Prostate cancer ([place]) 2018  . Small bowel obstruction ([place]) 10/2017; 11/21/2017; 02/16/2018  . Spigelian hernia    bilateral    Past Surgical History:  Procedure Laterality Date  . APPENDECTOMY  1992   at time of exp laparotomy at which time he was noted to have fistulizing Crohn's rather than UC  . COLON SURGERY    . COLONOSCOPY [name] 08/23/2014   [id number]:[id number] proctoscopy with possible fistulous opening in thebase of rectal/anal stump.    . COLOSTOMY  1992   transverse loop colostomy secondary to a stricture  . ESOPHAGOGASTRODUODENOSCOPY  05/2009   SLF: multiple antral erosions, large ulcer at ansatomosis (postsurgical changes at duodenal bulb and second portion of duodenum) BX c/x NSAIDS.  . EUS  10/04/2009   Dr. Estill Bakes dilated CBD and main pancreatic duct.  No pancreatic  . EXPLORATORY LAPAROTOMY  1992  . FLEXIBLE SIGMOIDOSCOPY  1988   Dr. Laural Golden- suggested rohn's disease but the biopsies were not collaborative.  [name] FLEXIBLE SIGMOIDOSCOPY [name] 09/08/2016   Procedure: FLEXIBLE SIGMOIDOSCOPY;  Surgeon: Wonda Horner, MD;  Location: [facility] ENDOSCOPY;  Service: Gastroenterology;  Laterality: [name];  . HEMICOLOECTOMY W/ ANASTOMOSIS  1993   R- Dr.DeMason   . HERNIA REPAIR  1996   incarcerated periostial hernia with additional surgery in 1999  . IR FLUORO GUIDE PORT INSERTION RIGHT  04/03/2017  . IR US GUIDE VASC ACCESS RIGHT  04/03/2017    Family History  Problem Relation Age of Onset  . Cancer Father        prostate   . Prostate cancer Father   . Colon cancer Father 66  . Hypertension Sister   . Cancer Sister   . Depression Sister   . Breast cancer Sister   . COPD Sister   . Aneurysm Brother        deceased, brain aneurysm    Social History   Tobacco Use  . Smoking status: Former Smoker    Packs/day: 0.75    Years: 32.00    Pack years: 24.00    Types:  Cigarettes    Last attempt to quit: 12/21/2009    Years since quitting: 8.3  . Smokeless tobacco: Never Used  Substance Use Topics  . Alcohol use: No    Alcohol/week: 0.0 oz    Comment: Former drinker  . Drug use: No    Prior to Admission medications   Medication Sig Start Date End Date Taking? Authorizing Provider  Adalimumab (HUMIRA) 40 MG/0.8ML PSKT Inject 0.8 mLs into the skin every 30 (thirty) days.   Yes [provider]  budesonide (ENTOCORT EC) 3 MG 24 hr capsule Take 3 capsules (9 mg total) by mouth daily. 04/08/18  Yes Annitta Needs, NP  calcium carbonate (OS-CAL) 600 MG TABS tablet Take 1,200 mg by mouth daily with breakfast.   Yes [provider]  cyclobenzaprine (FLEXERIL) 5 MG tablet Take 1 tablet (5 mg total) by mouth 3 (three) times daily as needed for muscle spasms. 04/06/18  Yes Harle Stanford., PA-C  Ferrous Sulfate 28 MG TABS Take 1 tablet by mouth daily.   Yes [provider]  HYDROcodone-acetaminophen (NORCO/VICODIN) 5-325 MG tablet Take 1 tablet by mouth every 6 (six) hours as needed for moderate pain. 03/30/18  Yes Courtland, [name], MD  lidocaine-prilocaine (EMLA) cream Apply 1 application topically as needed (when accessing port).   Yes [provider]  mirtazapine (REMERON) 15 MG tablet TAKE 1 TABLET BY MOUTH AT BEDTIME 04/07/18  Yes [place], [name], MD  Multiple Vitamins-Minerals (CENTRUM SILVER ADULT 50+) TABS Take 1 tablet by mouth daily.   Yes [provider]  triamcinolone ointment (KENALOG) 0.5 % Apply 1 application topically 2 (two) times daily. Patient taking differently: Apply 1 application topically 2 (two) times daily as needed (rash).  04/03/18  Yes Moss, Amber, DO    Current Facility-Administered Medications  Medication Dose Route Frequency Provider Last Rate Last Dose  . 0.9 %  sodium chloride infusion   Intravenous Continuous Rondel Jumbo, PA-C      . acetaminophen (TYLENOL) tablet 650 mg  650 mg Oral Q6H  PRN Rondel Jumbo, PA-C       Or  . acetaminophen (TYLENOL) suppository 650 mg  650 mg Rectal Q6H PRN Rondel Jumbo, PA-C      . bisacodyl (DULCOLAX) suppository 10 mg  10 mg Rectal Daily PRN Sharene Butters E, PA-C      . iopamidol (ISOVUE-300) 61 % injection           . iopamidol (ISOVUE-300) 61 % injection           . morphine 4 MG/ML injection 1 mg  1 mg Intravenous Q4H PRN Rondel Jumbo, PA-C      . ondansetron (ZOFRAN) tablet 4 mg  4 mg Oral Q6H PRN Rondel Jumbo, PA-C       Or  . ondansetron (ZOFRAN) injection 4 mg  4 mg Intravenous Q6H PRN Rondel Jumbo, PA-C      . senna-docusate (Senokot-S) tablet 1 tablet  1 tablet Oral QHS PRN Rondel Jumbo, PA-C       Current Outpatient Medications  Medication Sig Dispense Refill  . Adalimumab (HUMIRA) 40 MG/0.8ML PSKT Inject 0.8 mLs into the skin every 30 (thirty) days.    . budesonide (ENTOCORT EC) 3 MG 24 hr capsule Take 3 capsules (9 mg total) by mouth daily. 90 capsule 3  . calcium carbonate (OS-CAL) 600 MG TABS tablet Take 1,200 mg by mouth daily with breakfast.    . cyclobenzaprine (FLEXERIL) 5 MG tablet Take 1 tablet (5 mg total) by mouth 3 (three) times daily as needed for muscle spasms. 30 tablet 0  . Ferrous Sulfate 28 MG TABS Take 1 tablet by mouth daily.    [name] HYDROcodone-acetaminophen (NORCO/VICODIN) 5-325 MG tablet Take 1 tablet by mouth every 6 (six) hours as needed for moderate pain. 60 tablet 0  . lidocaine-prilocaine (EMLA) cream Apply 1 application topically as needed (when accessing port).    . mirtazapine (REMERON) 15 MG tablet TAKE 1 TABLET BY MOUTH AT BEDTIME 90 tablet 1  . Multiple Vitamins-Minerals (CENTRUM SILVER ADULT 50+) TABS Take 1 tablet by mouth daily.    [name] triamcinolone ointment (KENALOG) 0.5 % Apply 1 application topically 2 (two) times daily. (Patient taking differently: Apply 1 application topically 2 (two) times daily as needed (rash). ) 30 g 0   Facility-Administered Medications Ordered in Other  Encounters  Medication Dose Route Frequency Provider Last Rate Last Dose  .  sodium chloride flush (NS) 0.9 % injection 10 mL  10 mL Intravenous PRN [name], MD   10 mL at 11/20/17 2505    Allergies as of 04/16/2018 - Review Complete 04/16/2018  Allergen Reaction Noted  . Penicillins Hives 01/25/2007     Review of Systems:    Constitutional: No weight loss, fever or chills Skin: No rash  Cardiovascular: No chest pain   Respiratory: No SOB  Gastrointestinal: See HPI and otherwise negative Genitourinary: No dysuria  Neurological: No headache Musculoskeletal: No new muscle or joint pain Hematologic: No bleeding  Psychiatric: No history of depression or anxiety   Physical Exam:  Vital signs in last 24 hours: Temp:  [98.2 F (36.8 C)-99.4 F (37.4 C)] 98.2 F (36.8 C) (04/27 0118) Pulse Rate:  [64-88] 74 (04/27 0900) Resp:  [15-20] 16 (04/27 0245) BP: (100-128)/(68-85) 110/72 (04/27 0900) SpO2:  [96 %-100 %] 96 % (04/27 0900) Weight:  [141 lb (64 kg)] 141 lb (64 kg) (04/26 2155)   General:   Pleasant AA male appears to be in NAD, Well developed, Well nourished, alert and cooperative Head:  Normocephalic and atraumatic. Eyes:   PEERL, EOMI. No icterus. Conjunctiva pink. Ears:  Normal auditory acuity. Neck:  Supple Throat: Oral cavity and pharynx without inflammation, swelling or lesion.  Lungs: Respirations even and unlabored. Lungs clear to auscultation bilaterally.   No wheezes, crackles, or rhonchi.  Heart: Normal S1, S2. No MRG. Regular rate and rhythm. No peripheral edema, cyanosis or pallor.  Abdomen:  Soft, nondistended, mild generalized TTP.  Multiple surgical scars, ileostomy present in right lower quadrant draining liquid stool, decreased bowel sounds  Rectal:  Not performed.  Msk:  Symmetrical without gross deformities. Peripheral pulses intact.  Extremities:  Without edema, no deformity or joint abnormality.  Neurologic:  Alert and  oriented x4;  grossly  normal neurologically. Skin:   Dry and intact without significant lesions or rashes. Psychiatric: Demonstrates good judgement and reason without abnormal affect or behaviors.   LAB RESULTS: Recent Labs    04/14/18 1010 04/16/18 2210  WBC 6.1 8.9  HGB 10.7* 11.6*  HCT 31.7* 35.2*  PLT 277 300   BMET Recent Labs    04/14/18 1010 04/16/18 2210  NA 141 137  K 3.8 4.3  CL 106 102  CO2 24 26  GLUCOSE 100 95  BUN 13 15  CREATININE 1.10 1.22  CALCIUM 9.1 9.2   LFT Recent Labs    04/16/18 2210  PROT 7.5  ALBUMIN 3.4*  AST 23  ALT 14*  ALKPHOS 58  BILITOT 1.0   STUDIES: Ct Abdomen Pelvis W Contrast  Result Date: 04/17/2018 CLINICAL DATA:  Abdominal pain, nausea with vomiting, and diarrhea for 4 days. Small bowel obstruction. Crohn's disease. Prostate carcinoma. EXAM: CT ABDOMEN AND PELVIS WITH CONTRAST TECHNIQUE: Multidetector CT imaging of the abdomen and pelvis was performed using the standard protocol following bolus administration of intravenous contrast. CONTRAST:  131m ISOVUE-300 IOPAMIDOL (ISOVUE-300) INJECTION 61% COMPARISON:  03/16/2018 FINDINGS: Lower Chest: No acute findings. Hepatobiliary:  No hepatic masses identified. Pancreas:  No mass or inflammatory changes. Spleen: Within normal limits in size and appearance. Adrenals/Urinary Tract: No masses identified. No evidence of hydronephrosis. Stomach/Bowel: Minimal increase in diffuse small bowel dilatation is seen. Transition point is most likely in the right anterior abdomen. There is mild to moderate wall thickening of small bowel loops seen in this region, with adjacent mesenteric soft tissue stranding, most consistent with active Crohn's disease. No abscess  is identified. There is also a large left abdominal wall spigelian hernia which contains multiple small bowel loops which show no evidence of dilatation or wall thickening. Vascular/Lymphatic: Mild right lower quadrant mesenteric lymphadenopathy with lymph nodes  measuring up to 1 cm, not significantly changed. Lymphadenopathy is seen in the right posterior gluteal region in the region of the ischial tuberosity which is also stable, largest lymph node measuring 2.0 cm on image 71/3. No retroperitoneal lymphadenopathy identified. No abdominal aortic aneurysm. Reproductive: A lobulated soft tissue mass with internal calcifications is seen in the right pelvic floor soft tissues, which involves the base of the bladder and region of the prostate. This measures 5.4 x 3.9 cm on image 69/3, without significant change compared to prior study. Other:  None. Musculoskeletal: Sclerotic bone metastases throughout the pelvis show no significant change. IMPRESSION: Similar high grade small bowel obstruction, with transition point in region of thickened small bowel loops just proximal to right lower quadrant ileostomy, most consistent with active Crohn's disease. No evidence of abscess or bowel perforation. 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 Stable mild lymphadenopathy in the right lower quadrant mesentery and right posterior gluteal soft tissues. Stable right pelvic floor soft tissue mass involving region of prostate gland and bladder base. Electronically Signed   By: Earle Gell M.D.   On: 04/17/2018 07:45    Impression / Plan:   Impression: 1.  Small bowel obstruction: Per imaging above, similar presentation with 2 previous admissions over the past few months, resolved with conservative care in the past, on Humira and Entocort 6 mg daily at home, some question per ED physician regarding scarring versus small bowel obstruction patient is still passing liquid stool and has only minimal abdominal pain on exam, no nausea or vomiting 2.  Metastatic prostate cancer: Currently undergoing androgen deprivation therapy  Plan: 1.  Patient will remain n.p.o. for now. 2.  Will discuss further with Dr. Hilarie Fredrickson, please await further  recommendations later today from him  Thank you for your kind consultation, we will continue to follow.  Daniel Howard  04/17/2018, 9:21 AM Pager #: [phone]

## 2018-04-17 NOTE — ED Provider Notes (Signed)
[facility] EMERGENCY DEPARTMENT Provider Note   CSN: [id number] Arrival date & time: 04/16/18  2040     History   Chief Complaint Chief Complaint  Patient presents with  . Abdominal Pain    HPI Daniel Howard is a 62 y.o. male.  The history is provided by the patient.  He has history of Crohn's disease, duodenal ulcer, prostate cancer, and recurrent small bowel obstructions and comes in with diarrhea for the last 2 days.  He has a colostomy, and stool has been more watery than normal.  There is also been some abdominal cramping.  He rated cramping at 10/10.  He denies nausea or vomiting.  Denies fever or chills.  Nothing makes pain better, nothing makes it worse.  This is similar to how his last 2 bowel obstructions have presented.  Past Medical History:  Diagnosis Date  . Abnormal finding of biliary tract    MRCP shows pancreatic/biliary tract dilation. EUS 2010 confirmed dilation but no chronic pancreaitis or mass. Vascular ectasia crimpoing distal CBD.   [name] Anxiety   . 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 Duodenal ulcer 2010   nsaids  . History of blood transfusion 1992   "related to colon OR"  . Peristomal hernia   . Prostate cancer ([place]) 2018  . Small bowel obstruction ([place]) 10/2017; 11/21/2017; 02/16/2018  . Spigelian hernia    bilateral    Patient Active Problem List   Diagnosis Date Noted  . Nausea with vomiting 11/10/2017  . Spigelian hernia 09/22/2017  . Abdominal pain of multiple sites 09/22/2017  . Goals of care, counseling/discussion 04/02/2017  . Prostate cancer ([place]) 04/02/2017  . Elevated PSA  03/04/2017  . Osteopenia 11/03/2016  . Pelvic mass in male 09/04/2016  . 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 Exacerbation of Crohn's disease ([place]) 09/04/2016  . Protein-calorie malnutrition, severe ([place]) 07/27/2014  . SBO (small bowel obstruction) ([place]) 07/26/2014  . Loss of weight 06/09/2014  . Crohn's disease of both small and large intestine with complication ([place]) 35/70/1779  . Boils 02/11/2013  . Ventral hernia 12/11/2009  . Anemia 12/03/2009  . Regional enteritis/Crohn's 01/25/2007    Past Surgical History:  Procedure Laterality Date  . APPENDECTOMY  1992   at time of exp laparotomy at which time he was noted to have fistulizing Crohn's rather than UC  . COLON SURGERY    . COLONOSCOPY [name] 08/23/2014   [id number] proctoscopy with possible fistulous opening in thebase of rectal/anal stump.    . COLOSTOMY  1992   transverse loop colostomy secondary to a stricture  . ESOPHAGOGASTRODUODENOSCOPY  05/2009   SLF: multiple antral erosions, large ulcer at ansatomosis (postsurgical changes at duodenal bulb and second portion of duodenum) BX c/x NSAIDS.  . EUS  10/04/2009   Dr. Estill Bakes dilated CBD and main pancreatic duct.  No pancreatic  . EXPLORATORY LAPAROTOMY  1992  . FLEXIBLE SIGMOIDOSCOPY  1988   Dr. Laural Golden- suggested rohn's disease but the biopsies were not collaborative.  [name] FLEXIBLE SIGMOIDOSCOPY [name] 09/08/2016   Procedure: FLEXIBLE SIGMOIDOSCOPY;  Surgeon: Wonda Horner, MD;  Location: [facility] ENDOSCOPY;  Service: Gastroenterology;  Laterality: [name];  . [place]  R- Dr.DeMason   . HERNIA REPAIR  1996   incarcerated periostial hernia with additional surgery in 1999  . IR FLUORO GUIDE PORT INSERTION RIGHT  04/03/2017  . IR US GUIDE VASC ACCESS RIGHT  04/03/2017        Home Medications    Prior to Admission medications   Medication Sig Start Date End Date Taking? Authorizing Provider  budesonide (ENTOCORT EC) 3 MG 24 hr capsule Take 3 capsules (9 mg  total) by mouth daily. 04/08/18   Annitta Needs, NP  calcium carbonate (OS-CAL) 600 MG TABS tablet Take 1,200 mg by mouth daily with breakfast.    [provider]  cyclobenzaprine (FLEXERIL) 5 MG tablet Take 1 tablet (5 mg total) by mouth 3 (three) times daily as needed for muscle spasms. 04/06/18   Tanner, Lyndon Code., PA-C  Ferrous Sulfate 28 MG TABS Take 1 tablet by mouth daily.    [provider]  HYDROcodone-acetaminophen (NORCO/VICODIN) 5-325 MG tablet Take 1 tablet by mouth every 6 (six) hours as needed for moderate pain. 03/30/18   Alycia Rossetti, MD  lidocaine-prilocaine (EMLA) cream Apply 1 application topically as needed (when accessing port).    [provider]  mirtazapine (REMERON) 15 MG tablet TAKE 1 TABLET BY MOUTH AT BEDTIME 04/07/18   [address], [name], MD  Multiple Vitamins-Minerals (CENTRUM SILVER ADULT 50+) TABS Take 1 tablet by mouth daily.    [provider]  triamcinolone ointment (KENALOG) 0.5 % Apply 1 application topically 2 (two) times daily. 04/03/18   Dannielle Huh, DO    Family History Family History  Problem Relation Age of Onset  . Cancer Father        prostate   . Prostate cancer Father   . Colon cancer Father 48  . Hypertension Sister   . Cancer Sister   . Depression Sister   . Breast cancer Sister   . COPD Sister   . Aneurysm Brother        deceased, brain aneurysm    Social History Social History   Tobacco Use  . Smoking status: Former Smoker    Packs/day: 0.75    Years: 32.00    Pack years: 24.00    Types: Cigarettes    Last attempt to quit: 12/21/2009    Years since quitting: 8.3  . Smokeless tobacco: Never Used  Substance Use Topics  . Alcohol use: No    Alcohol/week: 0.0 oz    Comment: Former drinker  . Drug use: No     Allergies   Penicillins   Review of Systems Review of Systems  All other systems reviewed and are negative.    Physical Exam Updated Vital Signs BP 112/79   Pulse 72   Temp  98.2 F (36.8 C) (Oral)   Resp 16   Ht 5' 2"  (1.575 m)   Wt 64 kg (141 lb)   SpO2 98%   BMI 25.79 kg/m   Physical Exam  Nursing note and vitals reviewed.  62 year old male, resting comfortably and in no acute distress. Vital signs are normal. Oxygen saturation is 98%, which is normal. Head is normocephalic and atraumatic. PERRLA, EOMI. Oropharynx is clear. Neck is nontender and supple without adenopathy or JVD. Back is nontender and there is no CVA tenderness. Lungs are clear without rales, wheezes, or rhonchi. Chest is nontender. Heart has regular rate and rhythm without murmur. Abdomen: Multiple surgical scars present which are well-healed.  Colostomy present in the right lower  quadrant draining liquid stool.  Abdomen is soft without significant tenderness.  Peristalsis is slightly hypoactive with a suggestion of some high-pitched bowel sounds. Extremities have no cyanosis or edema, full range of motion is present. Skin is warm and dry without rash. Neurologic: Mental status is normal, cranial nerves are intact, there are no motor or sensory deficits.  ED Treatments / Results  Labs (all labs ordered are listed, but only abnormal results are displayed) Labs Reviewed  COMPREHENSIVE METABOLIC PANEL - Abnormal; Notable for the following components:      Result Value   Albumin 3.4 (*)    ALT 14 (*)    All other components within normal limits  CBC - Abnormal; Notable for the following components:   RBC 3.63 (*)    Hemoglobin 11.6 (*)    HCT 35.2 (*)    All other components within normal limits  LIPASE, BLOOD  URINALYSIS, ROUTINE W REFLEX MICROSCOPIC   Radiology Ct Abdomen Pelvis W Contrast  Result Date: 04/17/2018 CLINICAL DATA:  Abdominal pain, nausea with vomiting, and diarrhea for 4 days. Small bowel obstruction. Crohn's disease. Prostate carcinoma. EXAM: CT ABDOMEN AND PELVIS WITH CONTRAST TECHNIQUE: Multidetector CT imaging of the abdomen and pelvis was performed using  the standard protocol following bolus administration of intravenous contrast. CONTRAST:  138m ISOVUE-300 IOPAMIDOL (ISOVUE-300) INJECTION 61% COMPARISON:  03/16/2018 FINDINGS: Lower Chest: No acute findings. Hepatobiliary:  No hepatic masses identified. Pancreas:  No mass or inflammatory changes. Spleen: Within normal limits in size and appearance. Adrenals/Urinary Tract: No masses identified. No evidence of hydronephrosis. Stomach/Bowel: Minimal increase in diffuse small bowel dilatation is seen. Transition point is most likely in the right anterior abdomen. There is mild to moderate wall thickening of small bowel loops seen in this region, with adjacent mesenteric soft tissue stranding, most consistent with active Crohn's disease. No abscess is identified. There is also a large left abdominal wall spigelian hernia which contains multiple small bowel loops which show no evidence of dilatation or wall thickening. Vascular/Lymphatic: Mild right lower quadrant mesenteric lymphadenopathy with lymph nodes measuring up to 1 cm, not significantly changed. Lymphadenopathy is seen in the right posterior gluteal region in the region of the ischial tuberosity which is also stable, largest lymph node measuring 2.0 cm on image 71/3. No retroperitoneal lymphadenopathy identified. No abdominal aortic aneurysm. Reproductive: A lobulated soft tissue mass with internal calcifications is seen in the right pelvic floor soft tissues, which involves the base of the bladder and region of the prostate. This measures 5.4 x 3.9 cm on image 69/3, without significant change compared to prior study. Other:  None. Musculoskeletal: Sclerotic bone metastases throughout the pelvis show no significant change. IMPRESSION: Similar high grade small bowel obstruction, with transition point in region of thickened small bowel loops just proximal to right lower quadrant ileostomy, most consistent with active Crohn's disease. No evidence of abscess or  bowel perforation. 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 Stable mild lymphadenopathy in the right lower quadrant mesentery and right posterior gluteal soft tissues. Stable right pelvic floor soft tissue mass involving region of prostate gland and bladder base. Electronically Signed   By: JEarle GellM.D.   On: 04/17/2018 07:45    Procedures Procedures   Medications Ordered in ED Medications  0.9 %  sodium chloride infusion ( Intravenous New Bag/Given 04/17/18 0324)  iopamidol (ISOVUE-300) 61 % injection (has no administration in time range)  iopamidol (ISOVUE-300) 61 % injection (has no  administration in time range)  morphine 4 MG/ML injection 4 mg (has no administration in time range)  iopamidol (ISOVUE-300) 61 % injection 100 mL (100 mLs Intravenous Contrast Given 04/17/18 0338)     Initial Impression / Assessment and Plan / ED Course  I have reviewed the triage vital signs and the nursing notes.  Pertinent labs & imaging results that were available during my care of the patient were reviewed by me and considered in my medical decision making (see chart for details).  Diarrhea in patient with recurrent small bowel obstructions.  Old records are reviewed, and he had been admitted on March 26 and February 26 with small bowel obstructions with presentation very similar to today.  Laboratory work-up is unremarkable.  However, given similarity of presentations, he will be sent for CT of abdomen and pelvis to rule out recurrent small bowel obstruction.  CT once again shows high-grade small bowel obstruction.  This is similar in appearance to prior to episodes.  Patient's clinical picture does not really seem consistent with high-grade small bowel obstruction as he is continuing to pass stool and is not vomiting and abdominal pain is not a prominent part of his presentation.  I am wondering if the findings on CT scan actually are representing  chronic scar tissue and not acute obstructions.  This will need discussion with radiology, gastroenterology, general surgery.  In the meantime, will admit for presumed small bowel obstruction.  Case is discussed with Sherrilyn Rist of [facility], who agrees to admit the patient.  Final Clinical Impressions(s) / ED Diagnoses   Final diagnoses:  Small bowel obstruction (HCC)  Normochromic normocytic anemia  Crohn's disease without complication, unspecified gastrointestinal tract location [facility])    ED Discharge Orders    None       Delora Fuel, MD 13/24/40 2244

## 2018-04-17 NOTE — H&P (Addendum)
History and Physical    Daniel Howard QPY:[id number] DOB: 1956-03-14 DOA: 04/16/2018   PCP: Alycia Rossetti, MD   Patient coming from:  Home    Chief Complaint: Abdominal pain, nausea  HPI: Daniel Howard is a 62 y.o. male with medical history significant for prostate cancer with bony metastasis followed by urology, history of Crohn's disease, on Humira, now presenting to the emergency department for evaluation of abdominal pain and diarrhea, no vomiting.  Of note, the patient has been admitted twice within the last 2 months with similar symptoms, one on February 26, and another one on March 26.  At the times, the patient improved rapidly with conservative management, and remaining in his usual state date until return with the above symptoms over the last 1 or 2 days.  He reports the pain to be waxing and waning, severe, primarily in the right lower quadrant, and mid abdomen, associated with nausea but no vomiting.  He denies any fever or chills.  The patient has liquid stool in his ileostomy bag.  No bleeding issues are reported.  He denies any chest pain or shortness of breath, or cough.  He denies any dysuria or gross hematuria.  He denies any lower extremity swelling or calf pain.  ED Course:  BP 110/76   Pulse 66   Temp 98.2 F (36.8 C) (Oral)   Resp 16   Ht 5' 2"  (1.575 m)   Wt 64 kg (141 lb)   SpO2 99%   BMI 25.79 kg/m   Hemoglobin 11.6, white count normal Lipase is 31 Urinalysis negative Received generous IV fluids pain medicine Surgery consult is pending, as well as GI consultation    Review of Systems:  As per HPI otherwise all other systems reviewed and are negative  Past Medical History:  Diagnosis Date  . Abnormal finding of biliary tract    MRCP shows pancreatic/biliary tract dilation. EUS 2010 confirmed dilation but no chronic pancreaitis or mass. Vascular ectasia crimpoing distal CBD.   [name] Anxiety   . 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 Duodenal ulcer 2010   nsaids  . History of blood transfusion 1992   "related to colon OR"  . Peristomal hernia   . Prostate cancer ([place]) 2018  . Small bowel obstruction ([place]) 10/2017; 11/21/2017; 02/16/2018  . Spigelian hernia    bilateral    Past Surgical History:  Procedure Laterality Date  . APPENDECTOMY  1992   at time of exp laparotomy at which time he was noted to have fistulizing Crohn's rather than UC  . COLON SURGERY    . COLONOSCOPY [name] 08/23/2014   TIW:PYKDXIP proctoscopy with possible fistulous opening in thebase of rectal/anal stump.    . COLOSTOMY  1992   transverse loop colostomy secondary to a stricture  . ESOPHAGOGASTRODUODENOSCOPY  05/2009   SLF: multiple antral erosions, large ulcer at ansatomosis (postsurgical changes at duodenal bulb and second portion of duodenum) BX c/x NSAIDS.  . EUS  10/04/2009   Dr. Estill Bakes dilated CBD and main pancreatic duct.  No pancreatic  . EXPLORATORY LAPAROTOMY  1992  . FLEXIBLE SIGMOIDOSCOPY  1988   Dr. Laural Golden- suggested rohn's disease but the biopsies  were not collaborative.  [name] FLEXIBLE SIGMOIDOSCOPY [name] 09/08/2016   Procedure: FLEXIBLE SIGMOIDOSCOPY;  Surgeon: Wonda Horner, MD;  Location: [facility] ENDOSCOPY;  Service: Gastroenterology;  Laterality: [name];  . HEMICOLOECTOMY W/ ANASTOMOSIS  1993   R- Dr.DeMason   . HERNIA REPAIR  1996   incarcerated periostial hernia with additional surgery in 1999  . IR FLUORO GUIDE PORT INSERTION RIGHT  04/03/2017  . IR US GUIDE VASC ACCESS RIGHT  04/03/2017    Social History Social History   Socioeconomic History  . Marital status: Married    Spouse name: Not on file  .  Number of children: 4  . Years of education: Not on file  . Highest education level: Not on file  Occupational History  . Occupation: disabled    Fish farm manager: UNEMPLOYED  Social Needs  . Financial resource strain: Not on file  . Food insecurity:    Worry: Not on file    Inability: Not on file  . Transportation needs:    Medical: Not on file    Non-medical: Not on file  Tobacco Use  . Smoking status: Former Smoker    Packs/day: 0.75    Years: 32.00    Pack years: 24.00    Types: Cigarettes    Last attempt to quit: 12/21/2009    Years since quitting: 8.3  . Smokeless tobacco: Never Used  Substance and Sexual Activity  . Alcohol use: No    Alcohol/week: 0.0 oz    Comment: Former drinker  . Drug use: No  . Sexual activity: Never    Partners: Female  Lifestyle  . Physical activity:    Days per week: Not on file    Minutes per session: Not on file  . Stress: Not on file  Relationships  . Social connections:    Talks on phone: Not on file    Gets together: Not on file    Attends religious service: Not on file    Active member of club or organization: Not on file    Attends meetings of clubs or organizations: Not on file    Relationship status: Not on file  . Intimate partner violence:    Fear of current or ex partner: Not on file    Emotionally abused: Not on file    Physically abused: Not on file    Forced sexual activity: Not on file  Other Topics Concern  . Not on file  Social History Narrative  . Not on file     Allergies  Allergen Reactions  . Penicillins Hives    Has patient had a PCN reaction causing immediate rash, facial/tongue/throat swelling, SOB or lightheadedness with hypotension: Yes Has patient had a PCN reaction causing severe rash involving mucus membranes or skin necrosis: No Has patient had a PCN reaction that required hospitalization: No Has patient had a PCN reaction occurring within the last 10 years: No If all of the above answers are "NO",  then may proceed with Cephalosporin use.     Family History  Problem Relation Age of Onset  . Cancer Father        prostate   . Prostate cancer Father   . Colon cancer Father 45  . Hypertension Sister   . Cancer Sister   . Depression Sister   . Breast cancer Sister   . COPD Sister   . Aneurysm Brother        deceased, brain aneurysm      Prior to Admission medications  Medication Sig Start Date End Date Taking? Authorizing Provider  Adalimumab (HUMIRA) 40 MG/0.8ML PSKT Inject 0.8 mLs into the skin every 30 (thirty) days.   Yes [provider]  budesonide (ENTOCORT EC) 3 MG 24 hr capsule Take 3 capsules (9 mg total) by mouth daily. 04/08/18  Yes Annitta Needs, NP  calcium carbonate (OS-CAL) 600 MG TABS tablet Take 1,200 mg by mouth daily with breakfast.   Yes [provider]  cyclobenzaprine (FLEXERIL) 5 MG tablet Take 1 tablet (5 mg total) by mouth 3 (three) times daily as needed for muscle spasms. 04/06/18  Yes Tanner, Lyndon Code., PA-C  Ferrous Sulfate 28 MG TABS Take 1 tablet by mouth daily.   Yes [provider]  HYDROcodone-acetaminophen (NORCO/VICODIN) 5-325 MG tablet Take 1 tablet by mouth every 6 (six) hours as needed for moderate pain. 03/30/18  Yes Minto, [name], MD  lidocaine-prilocaine (EMLA) cream Apply 1 application topically as needed (when accessing port).   Yes [provider]  mirtazapine (REMERON) 15 MG tablet TAKE 1 TABLET BY MOUTH AT BEDTIME 04/07/18  Yes [place], [name], MD  Multiple Vitamins-Minerals (CENTRUM SILVER ADULT 50+) TABS Take 1 tablet by mouth daily.   Yes [provider]  triamcinolone ointment (KENALOG) 0.5 % Apply 1 application topically 2 (two) times daily. Patient taking differently: Apply 1 application topically 2 (two) times daily as needed (rash).  04/03/18  Yes Dannielle Huh, DO    Physical Exam:  Vitals:   04/17/18 0500 04/17/18 0530 04/17/18 0600 04/17/18 0630  BP: 128/83 100/69 124/80 110/76    Pulse: 85 64 68 66  Resp:      Temp:      TempSrc:      SpO2: 100% 98% 98% 99%  Weight:      Height:       Constitutional: NAD, calm, cachectic, appears uncomfortable eyes: PERRL, lids and conjunctivae normal ENMT: Mucous membranes are moist, without exudate or lesions  Neck: normal, supple, no masses, no thyromegaly Respiratory: clear to auscultation bilaterally, no wheezing, no crackles. Normal respiratory effort  Cardiovascular: Regular rate and rhythm, 1 out of 6 murmur, rubs or gallops. No extremity edema. 2+ pedal pulses. No carotid bruits.  Abdomen: Soft, tenderness in the lower quadrants, periumbilically, rigth ostomy bag with some loose stool, no bloody seen.  Bowel sounds are hypoactive.  No hepatosplenomegaly.  Multiple surgical scars are present, well-healed.  Musculoskeletal: no clubbing / cyanosis. Moves all extremities Skin: no jaundice, No lesions.  Neurologic: Sensation intact  Strength equal in all extremities Psychiatric:   Alert and oriented x 3. Normal mood.     Labs on Admission: I have personally reviewed following labs and imaging studies  CBC: Recent Labs  Lab 04/14/18 1010 04/16/18 2210  WBC 6.1 8.9  NEUTROABS 3.5  --   HGB 10.7* 11.6*  HCT 31.7* 35.2*  MCV 96.7 97.0  PLT 277 779    Basic Metabolic Panel: Recent Labs  Lab 04/14/18 1010 04/16/18 2210  NA 141 137  K 3.8 4.3  CL 106 102  CO2 24 26  GLUCOSE 100 95  BUN 13 15  CREATININE 1.10 1.22  CALCIUM 9.1 9.2    GFR: Estimated Creatinine Clearance: 49.7 mL/min (by C-G formula based on SCr of 1.22 mg/dL).  Liver Function Tests: Recent Labs  Lab 04/14/18 1010 04/16/18 2210  AST 16 23  ALT 11 14*  ALKPHOS 56 58  BILITOT 0.4 1.0  PROT 7.0 7.5  ALBUMIN 3.1* 3.4*  Recent Labs  Lab 04/16/18 2210  LIPASE 31   No results for input(s): AMMONIA in the last 168 hours.  Coagulation Profile: No results for input(s): INR, PROTIME in the last 168 hours.  Cardiac Enzymes: No  results for input(s): CKTOTAL, CKMB, CKMBINDEX, TROPONINI in the last 168 hours.  BNP (last 3 results) No results for input(s): PROBNP in the last 8760 hours.  HbA1C: No results for input(s): HGBA1C in the last 72 hours.  CBG: No results for input(s): GLUCAP in the last 168 hours.  Lipid Profile: No results for input(s): CHOL, HDL, LDLCALC, TRIG, CHOLHDL, LDLDIRECT in the last 72 hours.  Thyroid Function Tests: No results for input(s): TSH, T4TOTAL, FREET4, T3FREE, THYROIDAB in the last 72 hours.  Anemia Panel: No results for input(s): VITAMINB12, FOLATE, FERRITIN, TIBC, IRON, RETICCTPCT in the last 72 hours.  Urine analysis:    Component Value Date/Time   COLORURINE YELLOW 04/16/2018 2208   APPEARANCEUR CLEAR 04/16/2018 2208   LABSPEC 1.025 04/16/2018 2208   PHURINE 5.0 04/16/2018 2208   GLUCOSEU NEGATIVE 04/16/2018 2208   HGBUR NEGATIVE 04/16/2018 2208   BILIRUBINUR NEGATIVE 04/16/2018 2208   KETONESUR NEGATIVE 04/16/2018 2208   PROTEINUR NEGATIVE 04/16/2018 2208   UROBILINOGEN 0.2 10/10/2014 1037   NITRITE NEGATIVE 04/16/2018 2208   LEUKOCYTESUR NEGATIVE 04/16/2018 2208    Sepsis Labs: @LABRCNTIP (procalcitonin:4,lacticidven:4) )No results found for this or any previous visit (from the past 240 hour(s)).   Radiological Exams on Admission: Ct Abdomen Pelvis W Contrast  Result Date: 04/17/2018 CLINICAL DATA:  Abdominal pain, nausea with vomiting, and diarrhea for 4 days. Small bowel obstruction. Crohn's disease. Prostate carcinoma. EXAM: CT ABDOMEN AND PELVIS WITH CONTRAST TECHNIQUE: Multidetector CT imaging of the abdomen and pelvis was performed using the standard protocol following bolus administration of intravenous contrast. CONTRAST:  126m ISOVUE-300 IOPAMIDOL (ISOVUE-300) INJECTION 61% COMPARISON:  03/16/2018 FINDINGS: Lower Chest: No acute findings. Hepatobiliary:  No hepatic masses identified. Pancreas:  No mass or inflammatory changes. Spleen: Within normal  limits in size and appearance. Adrenals/Urinary Tract: No masses identified. No evidence of hydronephrosis. Stomach/Bowel: Minimal increase in diffuse small bowel dilatation is seen. Transition point is most likely in the right anterior abdomen. There is mild to moderate wall thickening of small bowel loops seen in this region, with adjacent mesenteric soft tissue stranding, most consistent with active Crohn's disease. No abscess is identified. There is also a large left abdominal wall spigelian hernia which contains multiple small bowel loops which show no evidence of dilatation or wall thickening. Vascular/Lymphatic: Mild right lower quadrant mesenteric lymphadenopathy with lymph nodes measuring up to 1 cm, not significantly changed. Lymphadenopathy is seen in the right posterior gluteal region in the region of the ischial tuberosity which is also stable, largest lymph node measuring 2.0 cm on image 71/3. No retroperitoneal lymphadenopathy identified. No abdominal aortic aneurysm. Reproductive: A lobulated soft tissue mass with internal calcifications is seen in the right pelvic floor soft tissues, which involves the base of the bladder and region of the prostate. This measures 5.4 x 3.9 cm on image 69/3, without significant change compared to prior study. Other:  None. Musculoskeletal: Sclerotic bone metastases throughout the pelvis show no significant change. IMPRESSION: Similar high grade small bowel obstruction, with transition point in region of thickened small bowel loops just proximal to right lower quadrant ileostomy, most consistent with active Crohn's disease. No evidence of abscess or bowel perforation. 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 Stable mild  lymphadenopathy in the right lower quadrant mesentery and right posterior gluteal soft tissues. Stable right pelvic floor soft tissue mass involving region of prostate gland and bladder base.  Electronically Signed   By: Earle Gell M.D.   On: 04/17/2018 07:45    EKG: Independently reviewed.  Assessment/Plan Principal Problem:   SBO (small bowel obstruction) (HCC) Active Problems:   Nausea without vomiting   Anemia   Crohn's disease of both small and large intestine with complication (HCC)   Protein-calorie malnutrition, severe (HCC)   Prostate cancer (HCC)   Recurrent small bowel obstruction.  Patient has a history of recurrent small bowel obstructions, with 2 admissions over the last 2 months, one on February 26, and the other one on March 16, 2018, again presenting with same symptoms.  Films show again dilated small bowel, abdominal pain and intractable nausea without vomiting.  Because of potential for adhesions, surgical consultation and GI consultations have been obtained.  He received IV fluids, morphine and Zofran, with some relief of his symptoms.  Lipase is 31 Admit to MedSurg inpatient, Follow on surgery and GI consultations, appreciate their evaluation. N.p.o. for now. Continue IV antiemetics, pain medication, and IV fluids  History of Crohn's disease, the patient is managed with Entocort CT of the abdomen and pelvis show SBO, with no abscess or fistula noted, however due to his history, GI is to see, with further recommendations.  History of prostate cancer, Patient was on docetaxel every 21 days from 04/24/2017 to 08/08/2017, now followed by urology.  PSA since 202,019 is at 0.3 currently on androgen deprivation therapy CT of the abdomen and pelvis with stable metastatic disease Follow urology as an outpatient  Anemia of chronic disease Hemoglobin on admission 11.6, stable Repeat CBC in am  No transfusion is indicated at this time  Deconditioning Ambulate with assistance Incentive spirometry PT/OT    DVT prophylaxis: SCD Code Status:    Full Family Communication:  Discussed with patient Disposition Plan: Expect patient to be discharged to home after  condition improves Consults called:    General surgery and GI Admission status: MedSurg inpatient   Sharene Butters, PA-C [facility]   Amion text  [id number]  Pt was seen and evaluated with NP, agree with assessment and plan as outline above,  Jani Gravel M.D. 04/17/2018, 8:34 AM

## 2018-04-17 NOTE — Progress Notes (Signed)
PT Cancellation Note  Patient Details Name: Daniel Howard MRN: [id number] DOB: May 16, 1956   Cancelled Treatment:    Reason Eval/Treat Not Completed: Patient at procedure or test/unavailable. Pt in the process of being transported to the floor. PT will continue to f/u with pt as available.    [place] 04/17/2018, 1:51 PM

## 2018-04-17 NOTE — Consult Note (Signed)
[facility] Surgery Consult Note  Daniel Howard 1956-03-19  [id number].    Requesting MD: Maudie Mercury Chief Complaint/Reason for Consult: SBO HPI:  Patient is a 62 year old male with history of Crohn's disease who presented to [facility] with abdominal pain and nausea. Well known to our service, we have consulted on 2/27 and 3/26 for identical presentation, typically resolves within 1-2 days without any intervention but bowel rest.     ROS: Otherwise neg  Family History  Problem Relation Age of Onset  . Cancer Father        prostate   . Prostate cancer Father   . Colon cancer Father 3  . Hypertension Sister   . Cancer Sister   . Depression Sister   . Breast cancer Sister   . COPD Sister   . Aneurysm Brother        deceased, brain aneurysm    Past Medical History:  Diagnosis Date  . Abnormal finding of biliary tract    MRCP shows pancreatic/biliary tract dilation. EUS 2010 confirmed dilation but no chronic pancreaitis or mass. Vascular ectasia crimpoing distal CBD.   [name] Anxiety   . 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 Duodenal ulcer 2010   nsaids  . History of blood transfusion 1992   "related to colon OR"  . Peristomal hernia   . Prostate cancer ([place]) 2018  . Small bowel obstruction ([place]) 10/2017; 11/21/2017; 02/16/2018  . Spigelian hernia    bilateral    Past Surgical History:  Procedure Laterality Date  . APPENDECTOMY  1992   at time of exp laparotomy at which time he was noted to have fistulizing Crohn's rather than UC  . COLON SURGERY    . COLONOSCOPY [name] 08/23/2014   CBU:LAGTXMI proctoscopy with possible fistulous opening in  thebase of rectal/anal stump.    . COLOSTOMY  1992   transverse loop colostomy secondary to a stricture  . ESOPHAGOGASTRODUODENOSCOPY  05/2009   SLF: multiple antral erosions, large ulcer at ansatomosis (postsurgical changes at duodenal bulb and second portion of duodenum) BX c/x NSAIDS.  . EUS  10/04/2009   Dr. Estill Bakes dilated CBD and main pancreatic duct.  No pancreatic  . EXPLORATORY LAPAROTOMY  1992  . FLEXIBLE SIGMOIDOSCOPY  1988   Dr. Laural Golden- suggested rohn's disease but the biopsies were not collaborative.  [name] FLEXIBLE SIGMOIDOSCOPY [name] 09/08/2016   Procedure: FLEXIBLE SIGMOIDOSCOPY;  Surgeon: Wonda Horner, MD;  Location: [facility] ENDOSCOPY;  Service: Gastroenterology;  Laterality: [name];  . HEMICOLOECTOMY W/ ANASTOMOSIS  1993   R- Dr.DeMason   . HERNIA REPAIR  1996   incarcerated periostial hernia with additional surgery in 1999  . IR FLUORO GUIDE PORT INSERTION RIGHT  04/03/2017  . IR US GUIDE VASC ACCESS RIGHT  04/03/2017    Social History:  reports that he quit smoking about 8 years ago. His smoking use included cigarettes. He has a 24.00 pack-year smoking history. He has never used smokeless tobacco. He reports that he does not drink alcohol or use drugs.  Allergies:  Allergies  Allergen Reactions  . Penicillins Hives    Has patient had a PCN reaction causing immediate rash, facial/tongue/throat swelling, SOB or lightheadedness with hypotension:  Yes Has patient had a PCN reaction causing severe rash involving mucus membranes or skin necrosis: No Has patient had a PCN reaction that required hospitalization: No Has patient had a PCN reaction occurring within the last 10 years: No If all of the above answers are "NO", then may proceed with Cephalosporin use.      (Not in a hospital admission)  Blood pressure 110/72, pulse 74, temperature 98.2 F (36.8 C), temperature source Oral, resp. rate 16, height 5' 2"  (1.575 m), weight 64 kg (141 lb), SpO2 96 %. Physical Exam: Abd  soft, midly distended, partially reducible left stoma site hernia. Ostomy in RLQ pink with stool on bag. Multiple scars.   Results for orders placed or performed during the hospital encounter of 04/16/18 (from the past 48 hour(s))  Urinalysis, Routine w reflex microscopic     Status: None   Collection Time: 04/16/18 10:08 PM  Result Value Ref Range   Color, Urine YELLOW YELLOW   APPearance CLEAR CLEAR   Specific Gravity, Urine 1.025 1.005 - 1.030   pH 5.0 5.0 - 8.0   Glucose, UA NEGATIVE NEGATIVE mg/dL   Hgb urine dipstick NEGATIVE NEGATIVE   Bilirubin Urine NEGATIVE NEGATIVE   Ketones, ur NEGATIVE NEGATIVE mg/dL   Protein, ur NEGATIVE NEGATIVE mg/dL   Nitrite NEGATIVE NEGATIVE   Leukocytes, UA NEGATIVE NEGATIVE    Comment: Performed at [facility] [address] [place], [place] [address]  Lipase, blood     Status: None   Collection Time: 04/16/18 10:10 PM  Result Value Ref Range   Lipase 31 11 - 51 U/L    Comment: Performed at [facility] [address] [place], [place] [address]  Comprehensive metabolic panel     Status: Abnormal   Collection Time: 04/16/18 10:10 PM  Result Value Ref Range   Sodium 137 135 - 145 mmol/L   Potassium 4.3 3.5 - 5.1 mmol/L   Chloride 102 101 - 111 mmol/L   CO2 26 22 - 32 mmol/L   Glucose, Bld 95 65 - 99 mg/dL   BUN 15 6 - 20 mg/dL   Creatinine, Ser 1.22 0.61 - 1.24 mg/dL   Calcium 9.2 8.9 - 10.3 mg/dL   Total Protein 7.5 6.5 - 8.1 g/dL   Albumin 3.4 (L) 3.5 - 5.0 g/dL   AST 23 15 - 41 U/L   ALT 14 (L) 17 - 63 U/L   Alkaline Phosphatase 58 38 - 126 U/L   Total Bilirubin 1.0 0.3 - 1.2 mg/dL   GFR calc non Af Amer >60 >60 mL/min   GFR calc Af Amer >60 >60 mL/min    Comment: (NOTE) The eGFR has been calculated using the CKD EPI equation. This calculation has not been validated in all clinical situations. eGFR's persistently <60 mL/min signify possible Chronic Kidney Disease.    Anion gap 9 5 - 15    Comment: Performed at  [facility] [address] [place], [place] [address]  CBC     Status: Abnormal   Collection Time: 04/16/18 10:10 PM  Result Value Ref Range   WBC 8.9 4.0 - 10.5 K/uL   RBC 3.63 (L) 4.22 - 5.81 MIL/uL   Hemoglobin 11.6 (L) 13.0 - 17.0 g/dL   HCT 35.2 (L) 39.0 - 52.0 %   MCV 97.0 78.0 - 100.0 fL   MCH 32.0 26.0 - 34.0 pg   MCHC 33.0 30.0 - 36.0 g/dL   RDW 13.9 11.5 -  15.5 %   Platelets 300 150 - 400 K/uL    Comment: Performed at [facility], [place] [address] [place], [place] [address]   Ct Abdomen Pelvis W Contrast  Result Date: 04/17/2018 CLINICAL DATA:  Abdominal pain, nausea with vomiting, and diarrhea for 4 days. Small bowel obstruction. Crohn's disease. Prostate carcinoma. EXAM: CT ABDOMEN AND PELVIS WITH CONTRAST TECHNIQUE: Multidetector CT imaging of the abdomen and pelvis was performed using the standard protocol following bolus administration of intravenous contrast. CONTRAST:  165m ISOVUE-300 IOPAMIDOL (ISOVUE-300) INJECTION 61% COMPARISON:  03/16/2018 FINDINGS: Lower Chest: No acute findings. Hepatobiliary:  No hepatic masses identified. Pancreas:  No mass or inflammatory changes. Spleen: Within normal limits in size and appearance. Adrenals/Urinary Tract: No masses identified. No evidence of hydronephrosis. Stomach/Bowel: Minimal increase in diffuse small bowel dilatation is seen. Transition point is most likely in the right anterior abdomen. There is mild to moderate wall thickening of small bowel loops seen in this region, with adjacent mesenteric soft tissue stranding, most consistent with active Crohn's disease. No abscess is identified. There is also a large left abdominal wall spigelian hernia which contains multiple small bowel loops which show no evidence of dilatation or wall thickening. Vascular/Lymphatic: Mild right lower quadrant mesenteric lymphadenopathy with lymph nodes measuring up to 1 cm, not significantly changed. Lymphadenopathy is seen in the right  posterior gluteal region in the region of the ischial tuberosity which is also stable, largest lymph node measuring 2.0 cm on image 71/3. No retroperitoneal lymphadenopathy identified. No abdominal aortic aneurysm. Reproductive: A lobulated soft tissue mass with internal calcifications is seen in the right pelvic floor soft tissues, which involves the base of the bladder and region of the prostate. This measures 5.4 x 3.9 cm on image 69/3, without significant change compared to prior study. Other:  None. Musculoskeletal: Sclerotic bone metastases throughout the pelvis show no significant change. IMPRESSION: Similar high grade small bowel obstruction, with transition point in region of thickened small bowel loops just proximal to right lower quadrant ileostomy, most consistent with active Crohn's disease. No evidence of abscess or bowel perforation. 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 Stable mild lymphadenopathy in the right lower quadrant mesentery and right posterior gluteal soft tissues. Stable right pelvic floor soft tissue mass involving region of prostate gland and bladder base. Electronically Signed   By: JEarle GellM.D.   On: 04/17/2018 07:45      Assessment/Plan Recurrent partial SBO 1. Admit to medicine 2.  NPO, bowel rest, IVF, pain control, antiemetics 3.  SCD's and lovenox for DVT proph 4.  Ambulate and IS  [facility] 04/17/2018, 9:40 AM

## 2018-04-17 NOTE — ED Notes (Signed)
Heart Healthy/ Carb Modified diet ordered for Lunch.

## 2018-04-17 NOTE — ED Notes (Signed)
Pt reports his stool was more runnier then it usually is. Pt reports he has a colostomy. Pt does report eating more greasy foods then he normally does.

## 2018-04-17 NOTE — ED Notes (Addendum)
Patient transported to CT 

## 2018-04-18 DIAGNOSIS — K50012 Crohn's disease of small intestine with intestinal obstruction: Secondary | ICD-10-CM

## 2018-04-18 LAB — CBC
HCT: 33.3 % — ABNORMAL LOW (ref 39.0–52.0)
HEMOGLOBIN: 10.8 g/dL — AB (ref 13.0–17.0)
MCH: 31.1 pg (ref 26.0–34.0)
MCHC: 32.4 g/dL (ref 30.0–36.0)
MCV: 96 fL (ref 78.0–100.0)
Platelets: 293 10*3/uL (ref 150–400)
RBC: 3.47 MIL/uL — AB (ref 4.22–5.81)
RDW: 13.3 % (ref 11.5–15.5)
WBC: 7.1 10*3/uL (ref 4.0–10.5)

## 2018-04-18 LAB — BASIC METABOLIC PANEL
ANION GAP: 7 (ref 5–15)
BUN: 9 mg/dL (ref 6–20)
CALCIUM: 8.6 mg/dL — AB (ref 8.9–10.3)
CHLORIDE: 106 mmol/L (ref 101–111)
CO2: 25 mmol/L (ref 22–32)
Creatinine, Ser: 1.06 mg/dL (ref 0.61–1.24)
GFR calc non Af Amer: >60 mL/min (ref >60)
Glucose, Bld: 93 mg/dL (ref 65–99)
POTASSIUM: 4 mmol/L (ref 3.5–5.1)
Sodium: 138 mmol/L (ref 135–145)

## 2018-04-18 MED ORDER — MIRTAZAPINE 15 MG PO TABS
15.0000 mg | ORAL_TABLET | Freq: Every day | ORAL | Status: DC
  Administered 2018-04-18: 15 mg via ORAL
  Filled 2018-04-18: qty 1

## 2018-04-18 NOTE — Progress Notes (Signed)
Subjective: Pain has resolved.  He states she is emptied his ileostomy several times.  He is ready to eat.  Objective: Vital signs in last 24 hours: Temp:  [97.5 F (36.4 C)-98.2 F (36.8 C)] 98.2 F (36.8 C) (04/28 0508) Pulse Rate:  [58-75] 72 (04/28 0508) Resp:  [15-18] 17 (04/28 0508) BP: (100-121)/(58-81) 107/81 (04/28 0508) SpO2:  [95 %-100 %] 99 % (04/28 0508) Weight:  [64 kg (141 lb 1.5 oz)] 64 kg (141 lb 1.5 oz) (04/27 1348) Last BM Date: 04/17/18  Intake/Output from previous day: 04/27 0701 - 04/28 0700 In: 1565.8 [P.O.:220; I.V.:1345.8] Out: 450 [Urine:450] Intake/Output this shift: No intake/output data recorded.   EXAM: General appearance: Alert.  No distress.  Upbeat attitude.  Oriented. Resp: clear to auscultation bilaterally GI: Multiple scars.  Soft chronic hernia left abdomen.  Midline wound intact.  Right-sided ileostomy with a little bit of prolapse and stool and air in bag.  Does not seem distended or tender. Extremities: No edema, tenderness, capillary refill good.  Lab Results:  Recent Labs    04/16/18 2210  WBC 8.9  HGB 11.6*  HCT 35.2*  PLT 300   BMET Recent Labs    04/16/18 2210  NA 137  K 4.3  CL 102  CO2 26  GLUCOSE 95  BUN 15  CREATININE 1.22  CALCIUM 9.2   PT/INR No results for input(s): LABPROT, INR in the last 72 hours. ABG No results for input(s): PHART, HCO3 in the last 72 hours.  Invalid input(s): PCO2, PO2  Studies/Results: Ct Abdomen Pelvis W Contrast  Result Date: 04/17/2018 CLINICAL DATA:  Abdominal pain, nausea with vomiting, and diarrhea for 4 days. Small bowel obstruction. Crohn's disease. Prostate carcinoma. EXAM: CT ABDOMEN AND PELVIS WITH CONTRAST TECHNIQUE: Multidetector CT imaging of the abdomen and pelvis was performed using the standard protocol following bolus administration of intravenous contrast. CONTRAST:  137m ISOVUE-300 IOPAMIDOL (ISOVUE-300) INJECTION 61% COMPARISON:  03/16/2018 FINDINGS: Lower  Chest: No acute findings. Hepatobiliary:  No hepatic masses identified. Pancreas:  No mass or inflammatory changes. Spleen: Within normal limits in size and appearance. Adrenals/Urinary Tract: No masses identified. No evidence of hydronephrosis. Stomach/Bowel: Minimal increase in diffuse small bowel dilatation is seen. Transition point is most likely in the right anterior abdomen. There is mild to moderate wall thickening of small bowel loops seen in this region, with adjacent mesenteric soft tissue stranding, most consistent with active Crohn's disease. No abscess is identified. There is also a large left abdominal wall spigelian hernia which contains multiple small bowel loops which show no evidence of dilatation or wall thickening. Vascular/Lymphatic: Mild right lower quadrant mesenteric lymphadenopathy with lymph nodes measuring up to 1 cm, not significantly changed. Lymphadenopathy is seen in the right posterior gluteal region in the region of the ischial tuberosity which is also stable, largest lymph node measuring 2.0 cm on image 71/3. No retroperitoneal lymphadenopathy identified. No abdominal aortic aneurysm. Reproductive: A lobulated soft tissue mass with internal calcifications is seen in the right pelvic floor soft tissues, which involves the base of the bladder and region of the prostate. This measures 5.4 x 3.9 cm on image 69/3, without significant change compared to prior study. Other:  None. Musculoskeletal: Sclerotic bone metastases throughout the pelvis show no significant change. IMPRESSION: Similar high grade small bowel obstruction, with transition point in region of thickened small bowel loops just proximal to right lower quadrant ileostomy, most consistent with active Crohn's disease. No evidence of abscess or bowel perforation. Persistent large  left abdominal wall spigelian hernia, without evidence of dilated bowel loops. Stable mild lymphadenopathy in the right lower quadrant mesentery and  right posterior gluteal soft tissues. Stable right pelvic floor soft tissue mass involving region of prostate gland and bladder base. Electronically Signed   By: Earle Gell M.D.   On: 04/17/2018 07:45    Anti-infectives: Anti-infectives (From admission, onward)   None      Assessment/Plan:   Recurrent partial SBO.  1 of many admissions for this.  Resolving.  Allow full liquids.  Manage medically.  No acute indication for surgical intervention.  Avoid surgical intervention unless life-threatening. Functioning ileostomy History numerous surgeries for Crohn's disease Left abdominal wall hernia at old ostomy site.  This is not a site of obstruction History of prostate cancer Chronic anemia Protein calorie malnutrition, moderately severe   LOS: 1 day    Adin Hector 04/18/2018

## 2018-04-18 NOTE — Progress Notes (Signed)
PT Discharge Note  Patient Details Name: Daniel Howard MRN: [id number] DOB: 1956-01-21   Cancelled Treatment:    Reason Eval/Treat Not Completed: PT screened, no needs identified, will sign off. Pt walking halls unassisted with no difficulties and denies any worries or concerns about function at home.     Kearney Hard [facility] 04/18/2018, 12:38 PM

## 2018-04-18 NOTE — Progress Notes (Signed)
OT Cancellation Note  Patient Details Name: Daniel Howard MRN: [id number] DOB: 05-29-1956   Cancelled Treatment:    Reason Eval/Treat Not Completed: OT screened, no needs identified, will sign off. Pt with no OT needs at this time. Able to complete ADL at independent level in hospital setting. Will sign off.   Norman Herrlich, MS OTR/L  Pager: [phone]   Norman Herrlich 04/18/2018, 1:43 PM

## 2018-04-18 NOTE — Progress Notes (Signed)
Progress Note   Subjective  Chief Complaint: Crohn's disease, small bowel obstruction  Today, explains he feels much better, "way improved", describes his stool is becoming more solid, he has had no further cramping and no nausea or vomiting, doing well with his full liquid diet.  Denies any new complaints.   Objective   Vital signs in last 24 hours: Temp:  [97.5 F (36.4 C)-98.2 F (36.8 C)] 98.2 F (36.8 C) (04/28 0508) Pulse Rate:  [58-72] 72 (04/28 0508) Resp:  [15-18] 17 (04/28 0508) BP: (100-121)/(58-81) 107/81 (04/28 0508) SpO2:  [95 %-100 %] 99 % (04/28 0508) Weight:  [141 lb 1.5 oz (64 kg)] 141 lb 1.5 oz (64 kg) (04/27 1348) Last BM Date: 04/18/18 General:    AA male in NAD Heart:  Regular rate and rhythm; no murmurs Lungs: Respirations even and unlabored, lungs CTA bilaterally Abdomen:  Soft, nontender and nondistended. Normal bowel sounds. +ileostomy with minimal yellow appearing semi-solid stool Extremities:  Without edema. Neurologic:  Alert and oriented,  grossly normal neurologically. Psych:  Cooperative. Normal mood and affect.  Intake/Output from previous day: 04/27 0701 - 04/28 0700 In: 1565.8 [P.O.:220; I.V.:1345.8] Out: 450 [Urine:450]  Lab Results: Recent Labs    04/16/18 2210 04/18/18 0711  WBC 8.9 7.1  HGB 11.6* 10.8*  HCT 35.2* 33.3*  PLT 300 293   BMET Recent Labs    04/16/18 2210 04/18/18 0711  NA 137 138  K 4.3 4.0  CL 102 106  CO2 26 25  GLUCOSE 95 93  BUN 15 9  CREATININE 1.22 1.06  CALCIUM 9.2 8.6*   LFT Recent Labs    04/16/18 2210  PROT 7.5  ALBUMIN 3.4*  AST 23  ALT 14*  ALKPHOS 58  BILITOT 1.0   Studies/Results: Ct Abdomen Pelvis W Contrast  Result Date: 04/17/2018 CLINICAL DATA:  Abdominal pain, nausea with vomiting, and diarrhea for 4 days. Small bowel obstruction. Crohn's disease. Prostate carcinoma. EXAM: CT ABDOMEN AND PELVIS WITH CONTRAST TECHNIQUE: Multidetector CT imaging of the abdomen and pelvis  was performed using the standard protocol following bolus administration of intravenous contrast. CONTRAST:  121m ISOVUE-300 IOPAMIDOL (ISOVUE-300) INJECTION 61% COMPARISON:  03/16/2018 FINDINGS: Lower Chest: No acute findings. Hepatobiliary:  No hepatic masses identified. Pancreas:  No mass or inflammatory changes. Spleen: Within normal limits in size and appearance. Adrenals/Urinary Tract: No masses identified. No evidence of hydronephrosis. Stomach/Bowel: Minimal increase in diffuse small bowel dilatation is seen. Transition point is most likely in the right anterior abdomen. There is mild to moderate wall thickening of small bowel loops seen in this region, with adjacent mesenteric soft tissue stranding, most consistent with active Crohn's disease. No abscess is identified. There is also a large left abdominal wall spigelian hernia which contains multiple small bowel loops which show no evidence of dilatation or wall thickening. Vascular/Lymphatic: Mild right lower quadrant mesenteric lymphadenopathy with lymph nodes measuring up to 1 cm, not significantly changed. Lymphadenopathy is seen in the right posterior gluteal region in the region of the ischial tuberosity which is also stable, largest lymph node measuring 2.0 cm on image 71/3. No retroperitoneal lymphadenopathy identified. No abdominal aortic aneurysm. Reproductive: A lobulated soft tissue mass with internal calcifications is seen in the right pelvic floor soft tissues, which involves the base of the bladder and region of the prostate. This measures 5.4 x 3.9 cm on image 69/3, without significant change compared to prior study. Other:  None. Musculoskeletal: Sclerotic bone metastases throughout the pelvis show  no significant change. IMPRESSION: Similar high grade small bowel obstruction, with transition point in region of thickened small bowel loops just proximal to right lower quadrant ileostomy, most consistent with active Crohn's disease. No  evidence of abscess or bowel perforation. 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 Stable mild lymphadenopathy in the right lower quadrant mesentery and right posterior gluteal soft tissues. Stable right pelvic floor soft tissue mass involving region of prostate gland and bladder base. Electronically Signed   By: Earle Gell M.D.   On: 04/17/2018 07:45    Assessment / Plan:    Assessment: 1.  Small bowel obstruction related to Crohn's disease: Long-standing ileocolonic Crohn's disease complicated by prior stricture, fistula formation and bowel obstruction status post permanent end ileostomy, followed by Dr. Lilia Pro maintained on Humira most recently Entocort decrease from 9 mg to 6 mg in the last several weeks, previously admitted late March with abdominal pain and diarrhea prior to this with a similar obstructive type picture, currently on Humira 40 mg every 14 days, last dose 04/14/2018, CT as above showing similar high-grade small bowel obstruction with transition point in the region of active Crohn's with thickening of small bowel loops just proximal to his right lower quadrant ileostomy, persistent large abdominal wall hernia,  Improving Day 2 of IV Solu-Medrol 2.  Metastatic prostate cancer  Plan: 1.  Continue IV Solu-Medrol for now 2.  Okay with increase to full liquids today as tolerated 3.  Okay with decreasing IV fluids to 75 cc/h 4.  Labs pending for Humira antibodies 5.  Please await further recommendations from Dr. Hilarie Fredrickson later today  Thank you for your kind consultation, we will continue to follow.    LOS: 1 day   Levin Erp  04/18/2018, 10:23 AM  Pager # [phone]

## 2018-04-18 NOTE — Progress Notes (Signed)
PROGRESS NOTE    Daniel Howard  KDT:[id number] DOB: 02/26/1956 DOA: 04/16/2018 PCP: Daniel Rossetti, MD      Brief Narrative:  Daniel Howard is a 62 y.o. M with prostate cancer with bony metastasis, Crohn's disease on Humira and extensive history of small bowel obstruction, fistula, and colostomy who presents with acute abdominal pain and vomiting.  CT in the emergency room shows high-grade obstruction, likely from Crohn's disease.   Assessment & Plan:  Crohn's disease Small bowel obstruction Advanced to liquids this morning by Gen Surg, then soft diet per GI.  Tells me he has already had a [place] steak, mashed potatoes, roll, and pudding without difficulty, seems to be tolerating soft diet without symptoms. -Stop IV fluids -Continue soft diet tonight, if asymptomatic tomorrow, advance as tolerated and likely home -Hold budesonide -Continue Solu-Medrol -Prednisone taper for 8 weeks at discharge per GI note  Chronic anemia Stable, from chronic disease.  Other medications -Restart home mirtazapine  Prostate cancer Patient was on docetaxel every 21 days from 04/24/2017 to 08/08/2017, now followed by urology.  PSA since 202,019 is at 0.3 currently on androgen deprivation therapy. CT of the abdomen and pelvis with stable metastatic disease.       DVT prophylaxis: SCDs Code Status: FULL Family Communication: Wife at bedside MDM and disposition Plan: The below labs and imaging reports were reviewed and summarized above.  The patient's status is clinically stable, improving.  He was initially admitted for SBO in the setting of Crohn's disease.    He is advancing diet well today, and ambulating independently, taking care of all ADLS.  If tolerates diet well tomorrow, likely home with 8 week steroid taper, follow up with Primary GI Dr. Gala Howard and resumption of Humira.  Will need outpaitientn follow up of Humira antibodies.   Consultants:   GI  Gen Surg  Procedures:    None  Antimicrobials:   None    Subjective: Feeling quite good.  Would like to go outside.  No more abdominal pain, no pain with eating, no vomiting, out put from ostomy normal, nonbloody.  No fever, no abdominal distension.  Objective: Vitals:   04/17/18 1348 04/17/18 2129 04/18/18 0508 04/18/18 1331  BP: 121/79 120/79 107/81 118/71  Pulse: 70 68 72 70  Resp: 15 18 17 12   Temp: (!) 97.5 F (36.4 C) 98.2 F (36.8 C) 98.2 F (36.8 C)   TempSrc: Oral Oral Oral Oral  SpO2: 100% 100% 99% 100%  Weight: 64 kg (141 lb 1.5 oz)     Height: 5' 4"  (1.626 m)      No intake or output data in the 24 hours ending 04/18/18 1703 Filed Weights   04/16/18 2155 04/17/18 1348  Weight: 64 kg (141 lb) 64 kg (141 lb 1.5 oz)    Examination: General appearance: Adult male, alert and in no acute distress.  Ambulating in hall. HEENT: Anicteric, conjunctiva pink, lids and lashes normal. No nasal deformity, discharge, epistaxis.  Lips moist, dentition poor.  Oral mucosa moist without lesions.   Skin: Warm and dry.   No suspicious rashes or lesions. Cardiac: RRR, nl S1-S2, no murmurs appreciated.  Capillary refill is brisk.  JVP normale.  No LE edema.  Radial pulses 2+ and symmetric. Respiratory: Normal respiratory rate and rhythm.  CTAB without rales or wheezes. Abdomen: Abdomen soft.  No TTP. Many old scars.  Ostomy with yellowish soft stool. MSK: No deformities or effusions. Neuro: Awake and alert.  EOMI, moves all extremities.  Speech fluent.    Psych: Sensorium intact and responding to questions, attention normal. Affect normal.  Judgment and insight appear normal.    Data Reviewed: I have personally reviewed following labs and imaging studies:  CBC: Recent Labs  Lab 04/14/18 1010 04/16/18 2210 04/18/18 0711  WBC 6.1 8.9 7.1  NEUTROABS 3.5  --   --   HGB 10.7* 11.6* 10.8*  HCT 31.7* 35.2* 33.3*  MCV 96.7 97.0 96.0  PLT 277 300 250   Basic Metabolic Panel: Recent Labs  Lab  04/14/18 1010 04/16/18 2210 04/18/18 0711  NA 141 137 138  K 3.8 4.3 4.0  CL 106 102 106  CO2 24 26 25   GLUCOSE 100 95 93  BUN 13 15 9   CREATININE 1.10 1.22 1.06  CALCIUM 9.1 9.2 8.6*   GFR: Estimated Creatinine Clearance: 62.1 mL/min (by C-G formula based on SCr of 1.06 mg/dL). Liver Function Tests: Recent Labs  Lab 04/14/18 1010 04/16/18 2210  AST 16 23  ALT 11 14*  ALKPHOS 56 58  BILITOT 0.4 1.0  PROT 7.0 7.5  ALBUMIN 3.1* 3.4*   Recent Labs  Lab 04/16/18 2210  LIPASE 31   No results for input(s): AMMONIA in the last 168 hours. Coagulation Profile: No results for input(s): INR, PROTIME in the last 168 hours. Cardiac Enzymes: No results for input(s): CKTOTAL, CKMB, CKMBINDEX, TROPONINI in the last 168 hours. BNP (last 3 results) No results for input(s): PROBNP in the last 8760 hours. HbA1C: No results for input(s): HGBA1C in the last 72 hours. CBG: No results for input(s): GLUCAP in the last 168 hours. Lipid Profile: No results for input(s): CHOL, HDL, LDLCALC, TRIG, CHOLHDL, LDLDIRECT in the last 72 hours. Thyroid Function Tests: No results for input(s): TSH, T4TOTAL, FREET4, T3FREE, THYROIDAB in the last 72 hours. Anemia Panel: No results for input(s): VITAMINB12, FOLATE, FERRITIN, TIBC, IRON, RETICCTPCT in the last 72 hours. Urine analysis:    Component Value Date/Time   COLORURINE YELLOW 04/16/2018 [place] 04/16/2018 2208   LABSPEC 1.025 04/16/2018 2208   PHURINE 5.0 04/16/2018 2208   GLUCOSEU NEGATIVE 04/16/2018 2208   HGBUR NEGATIVE 04/16/2018 2208   BILIRUBINUR NEGATIVE 04/16/2018 2208   KETONESUR NEGATIVE 04/16/2018 2208   PROTEINUR NEGATIVE 04/16/2018 2208   UROBILINOGEN 0.2 10/10/2014 1037   NITRITE NEGATIVE 04/16/2018 2208   LEUKOCYTESUR NEGATIVE 04/16/2018 2208   Sepsis Labs: @LABRCNTIP (procalcitonin:4,lacticacidven:4)  )No results found for this or any previous visit (from the past 240 hour(s)).       Radiology  Studies: Ct Abdomen Pelvis W Contrast  Result Date: 04/17/2018 CLINICAL DATA:  Abdominal pain, nausea with vomiting, and diarrhea for 4 days. Small bowel obstruction. Crohn's disease. Prostate carcinoma. EXAM: CT ABDOMEN AND PELVIS WITH CONTRAST TECHNIQUE: Multidetector CT imaging of the abdomen and pelvis was performed using the standard protocol following bolus administration of intravenous contrast. CONTRAST:  121m ISOVUE-300 IOPAMIDOL (ISOVUE-300) INJECTION 61% COMPARISON:  03/16/2018 FINDINGS: Lower Chest: No acute findings. Hepatobiliary:  No hepatic masses identified. Pancreas:  No mass or inflammatory changes. Spleen: Within normal limits in size and appearance. Adrenals/Urinary Tract: No masses identified. No evidence of hydronephrosis. Stomach/Bowel: Minimal increase in diffuse small bowel dilatation is seen. Transition point is most likely in the right anterior abdomen. There is mild to moderate wall thickening of small bowel loops seen in this region, with adjacent mesenteric soft tissue stranding, most consistent with active Crohn's disease. No abscess is identified. There is also a large left abdominal wall  spigelian hernia which contains multiple small bowel loops which show no evidence of dilatation or wall thickening. Vascular/Lymphatic: Mild right lower quadrant mesenteric lymphadenopathy with lymph nodes measuring up to 1 cm, not significantly changed. Lymphadenopathy is seen in the right posterior gluteal region in the region of the ischial tuberosity which is also stable, largest lymph node measuring 2.0 cm on image 71/3. No retroperitoneal lymphadenopathy identified. No abdominal aortic aneurysm. Reproductive: A lobulated soft tissue mass with internal calcifications is seen in the right pelvic floor soft tissues, which involves the base of the bladder and region of the prostate. This measures 5.4 x 3.9 cm on image 69/3, without significant change compared to prior study. Other:  None.  Musculoskeletal: Sclerotic bone metastases throughout the pelvis show no significant change. IMPRESSION: Similar high grade small bowel obstruction, with transition point in region of thickened small bowel loops just proximal to right lower quadrant ileostomy, most consistent with active Crohn's disease. No evidence of abscess or bowel perforation. 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 Stable mild lymphadenopathy in the right lower quadrant mesentery and right posterior gluteal soft tissues. Stable right pelvic floor soft tissue mass involving region of prostate gland and bladder base. Electronically Signed   By: Earle Gell M.D.   On: 04/17/2018 07:45        Scheduled Meds: . methylPREDNISolone (SOLU-MEDROL) injection  15.2 mg Intravenous TID   Continuous Infusions:   LOS: 1 day    Time spent: 25 minutes    Edwin Dada, MD [facility] 04/18/2018, 3:00 PM     Pager [phone] --- please page though AMION:  [email or web address] Password [email or web address] If 7PM-7AM, please contact night-coverage

## 2018-04-19 DIAGNOSIS — R110 Nausea: Secondary | ICD-10-CM

## 2018-04-19 DIAGNOSIS — K5090 Crohn's disease, unspecified, without complications: Secondary | ICD-10-CM

## 2018-04-19 DIAGNOSIS — D649 Anemia, unspecified: Secondary | ICD-10-CM

## 2018-04-19 DIAGNOSIS — K50819 Crohn's disease of both small and large intestine with unspecified complications: Secondary | ICD-10-CM

## 2018-04-19 DIAGNOSIS — C61 Malignant neoplasm of prostate: Secondary | ICD-10-CM

## 2018-04-19 DIAGNOSIS — K56609 Unspecified intestinal obstruction, unspecified as to partial versus complete obstruction: Secondary | ICD-10-CM

## 2018-04-19 DIAGNOSIS — E43 Unspecified severe protein-calorie malnutrition: Secondary | ICD-10-CM

## 2018-04-19 DIAGNOSIS — D530 Protein deficiency anemia: Secondary | ICD-10-CM

## 2018-04-19 MED ORDER — ADALIMUMAB 40 MG/0.8ML ~~LOC~~ PSKT: 0.8000 mL | PREFILLED_SYRINGE | SUBCUTANEOUS | 0 refills | Status: DC

## 2018-04-19 MED ORDER — PREDNISONE 20 MG PO TABS: ORAL_TABLET | ORAL | 0 refills | Status: AC

## 2018-04-19 NOTE — Progress Notes (Signed)
1200 dc instructions given to patient, all questions answered. Pt discharged with friend to home.

## 2018-04-19 NOTE — Discharge Instructions (Signed)
Small Bowel Obstruction A small bowel obstruction is a blockage in the small bowel. The small bowel, which is also called the small intestine, is a long, slender tube that connects the stomach to the colon. When a person eats and drinks, food and fluids go from the stomach to the small bowel. This is where most of the nutrients in the food and fluids are absorbed. A small bowel obstruction will prevent food and fluids from passing through the small bowel as they normally do during digestion. The small bowel can become partially or completely blocked. This can cause symptoms such as abdominal pain, vomiting, and bloating. If this condition is not treated, it can be dangerous because the small bowel could rupture. What are the causes? Common causes of this condition include:  Scar tissue from previous surgery or radiation treatment.  Recent surgery. This may cause the movements of the bowel to slow down and cause food to block the intestine.  Hernias.  Inflammatory bowel disease (colitis).  Twisting of the bowel (volvulus).  Tumors.  A foreign body.  Slipping of a part of the bowel into another part (intussusception).  What are the signs or symptoms? Symptoms of this condition include:  Abdominal pain. This may be dull cramps or sharp pain. It may occur in one area, or it may be present in the entire abdomen. Pain can range from mild to severe, depending on the degree of obstruction.  Nausea and vomiting. Vomit may be greenish or a yellow bile color.  Abdominal bloating.  Constipation.  Lack of passing gas.  Frequent belching.  Diarrhea. This may occur if the obstruction is partial and runny stool is able to leak around the obstruction.  How is this diagnosed? This condition may be diagnosed based on a physical exam, medical history, and X-rays of the abdomen. You may also have other tests, such as a CT scan of the abdomen and pelvis. How is this treated? Treatment for this  condition depends on the cause and severity of the problem. Treatment options may include:  Bed rest along with fluids and pain medicines that are given through an IV tube inserted into one of your veins. Sometimes, this is all that is needed for the obstruction to improve.  Following a simple diet. In some cases, a clear liquid diet may be required for several days. This allows the bowel to rest.  Placement of a small tube (nasogastric tube) into the stomach. When the bowel is blocked, it usually swells up like a balloon that is filled with air and fluids. The air and fluids may be removed by suction through the nasogastric tube. This can help with pain, discomfort, and nausea. It can also help the obstruction to clear up faster.  Surgery. This may be required if other treatments do not work. Bowel obstruction from a hernia may require early surgery and can be an emergency procedure. Surgery may also be required for scar tissue that causes frequent or severe obstructions.  Follow these instructions at home:  Get plenty of rest.  Follow instructions from your health care provider about eating restrictions. You may need to avoid solid foods and consume only clear liquids until your condition improves.  Take over-the-counter and prescription medicines only as told by your health care provider.  Keep all follow-up visits as told by your health care provider. This is important. Contact a health care provider if:  You have a fever.  You have chills. Get help right away if:  You have increased pain or cramping.  You vomit blood.  You have uncontrolled vomiting or nausea.  You cannot drink fluids because of vomiting or pain.  You develop confusion.  You begin feeling very dry or thirsty (dehydrated).  You have severe bloating.  You feel extremely weak or you faint. This information is not intended to replace advice given to you by your health care provider. Make sure you discuss any  questions you have with your health care provider. Document Released: 02/24/2006 Document Revised: 08/04/2016 Document Reviewed: 02/01/2015 Elsevier Interactive Patient Education  2018 [place] Bowel Obstruction A small bowel obstruction means that something is blocking the small bowel. The small bowel is also called the small intestine. It is the long tube that connects the stomach to the colon. An obstruction will stop food and fluids from passing through the small bowel. Treatment depends on what is causing the problem and how bad the problem is. Follow these instructions at home:  Get a lot of rest.  Follow your diet as told by your doctor. You may need to: ? Only drink clear liquids until you start to get better. ? Avoid solid foods as told by your doctor.  Take over-the-counter and prescription medicines only as told by your doctor.  Keep all follow-up visits as told by your doctor. This is important. Contact a doctor if:  You have a fever.  You have chills. Get help right away if:  You have pain or cramps that get worse.  You throw up (vomit) blood.  You have a feeling of being sick to your stomach (nausea) that does not go away.  You cannot stop throwing up.  You cannot drink fluids.  You feel confused.  You feel dry or thirsty (dehydrated).  Your belly gets more bloated.  You feel weak or you pass out (faint). This information is not intended to replace advice given to you by your health care provider. Make sure you discuss any questions you have with your health care provider. Document Released: 01/15/2005 Document Revised: 08/04/2016 Document Reviewed: 02/01/2015 Elsevier Interactive Patient Education  [employer].

## 2018-04-19 NOTE — Final Consult Note (Signed)
Consultant Final Sign-Off Note    Assessment/Final recommendations  Daniel Howard is a 62 y.o. male followed by me for SBO   Wound care (if applicable):    Diet at discharge: soft diet    Activity at discharge: per primary team   Follow-up appointment:  None needed   Pending results:  Unresulted Labs (From admission, onward)   Start     Ordered   04/17/18 1233  Miscellaneous [facility] test (send-out)  Once,   R    Comments:  TEST: [id number]  CPT: 62952; 84132   Question:  Test name / description:  Answer:  adalimumab level and antibody   04/17/18 1234       Medication recommendations:   Other recommendations:    Thank you for allowing [place] to participate in the care of your patient!  Please consult [place] again if you have further needs for your patient.  Kalman Drape 04/19/2018 10:50 AM    Subjective   CC: SBO  Pt denies abdominal pain. He is tolerating a soft diet. He is having ostomy output. No new complaints  Objective  Vital signs in last 24 hours: Temp:  [97.7 F (36.5 C)-98.8 F (37.1 C)] 97.7 F (36.5 C) (04/29 0438) Pulse Rate:  [58-73] 58 (04/29 0438) Resp:  [12] 12 (04/29 0438) BP: (95-123)/(65-78) 95/65 (04/29 0438) SpO2:  [99 %-100 %] 99 % (04/29 0438) Weight:  [67.8 kg (149 lb 7.6 oz)] 67.8 kg (149 lb 7.6 oz) (04/29 0438)  PE: General: well appearing, NAD Lungs: rate and effort normal Abdomen: Good BS. Multiple scars.  Soft chronic hernia left abdomen.  Right-sided ileostomy with stool and air in bag.  Not distended or tender.   Pertinent labs and Studies: Recent Labs    04/16/18 2210 04/18/18 0711  WBC 8.9 7.1  HGB 11.6* 10.8*  HCT 35.2* 33.3*   BMET Recent Labs    04/16/18 2210 04/18/18 0711  NA 137 138  K 4.3 4.0  CL 102 106  CO2 26 25  GLUCOSE 95 93  BUN 15 9  CREATININE 1.22 1.06  CALCIUM 9.2 8.6*   No results for input(s): LABURIN in the last 72 hours. Results for orders placed or performed in visit on 12/02/17   WOUND CULTURE     Status: Abnormal   Collection Time: 12/02/17  4:19 PM  Result Value Ref Range Status   MICRO NUMBER: [id number]  Final   SPECIMEN QUALITY: ADEQUATE  Final   SOURCE: RIGHT CHEEK  Final   STATUS: FINAL  Final   GRAM STAIN:   Final    No white blood cells seen No epithelial cells seen Rare Gram positive cocci in clusters   ISOLATE 1: methicillin resistant Staphylococcus aureus (A)  Final      Susceptibility   Methicillin resistant staphylococcus aureus - AEROBIC CULT, GRAM STAIN POSITIVE 1    VANCOMYCIN <=0.5 Sensitive     CIPROFLOXACIN <=0.5 Sensitive     CLINDAMYCIN <=0.25 Sensitive     LEVOFLOXACIN 0.25 Sensitive     ERYTHROMYCIN >=8 Resistant     GENTAMICIN <=0.5 Sensitive     OXACILLIN* NR Resistant      * Oxacillin-resistant staphylococci are resistant toall currently available beta-lactam antimicrobialagents including penicillins, beta lactam/beta-lactamase inhibitor combinations, and cephems withstaphylococcal indications, including Cefazolin.    TETRACYCLINE <=1 Sensitive     TRIMETH/SULFA* <=10 Sensitive      * Oxacillin-resistant staphylococci are resistant toall currently available beta-lactam antimicrobialagents including penicillins, beta lactam/beta-lactamase inhibitor  combinations, and cephems withstaphylococcal indications, including Cefazolin.Legend:S = Susceptible  I = IntermediateR = Resistant  NS = Not susceptible* = Not tested  NR = Not reported**NN = See antimicrobic comments    Imaging: No results found.

## 2018-04-19 NOTE — Discharge Summary (Signed)
Discharge Summary  Daniel Howard EXN:[id number] DOB: 09-04-1956  PCP: Daniel Rossetti, MD  Admit date: 04/16/2018 Discharge date: 04/19/2018  Time spent: 25 minutes  Recommendations for Outpatient Follow-up:  1. Follow up with GI 2. Follow up with PCP 3. Take your medications as prescribed  Discharge Diagnoses:  Active Hospital Problems   Diagnosis Date Noted  . SBO (small bowel obstruction) ([place]) 07/26/2014  . Nausea without vomiting 11/10/2017  . Prostate cancer ([place]) 04/02/2017  . Protein-calorie malnutrition, severe ([place]) 07/27/2014  . Crohn's disease of both small and large intestine with complication ([place]) 49/67/5916  . Anemia 12/03/2009    Resolved Hospital Problems  No resolved problems to display.    Discharge Condition: Stable  Diet recommendation: Resume previous diet  Vitals:   04/18/18 2145 04/19/18 0438  BP: 123/78 95/65  Pulse: 73 (!) 58  Resp: 12 12  Temp: 98.8 F (37.1 C) 97.7 F (36.5 C)  SpO2: 100% 99%    History of present illness:  Mr. Daniel Howard is a 62 y.o. M with prostate cancer with bony metastasis, Crohn's disease on Humira and extensive history of small bowel obstruction, fistula, and colostomy who presents with acute abdominal pain and vomiting.  CT in the emergency room shows high-grade obstruction, likely from Crohn's disease.  04/19/18: Patient seen and examined at his bedside.  Has no new complaints.  Denies any abdominal pain or cramping.  Positive flatus and bowel movement in his colostomy bag.  On the day of discharge the patient was hemodynamically stable.  He will need to follow-up with GI post hospitalization and take his medications as prescribed.  GI recommendations as followed below. I discharge I recommend we switch budesonide to prednisone given that this flare occurred on 6 mg of budesonide. --Plan prednisone 40 mg x 7 days, decrease by 5 mg every 7 days until off (8-week taper) --Continue Humira 40 mg every 14 days;  follow-up adalimumab level and antibody (test was ordered yesterday but not drawn, I spoke to the lab and they will draw this today.  This will be a send out and will need to be followed after discharge) --Close follow-up with his outpatient gastroenterologist Dr. Breck Coons Course:  Principal Problem:   SBO (small bowel obstruction) (HCC) Active Problems:   Anemia   Crohn's disease of both small and large intestine with complication (HCC)   Protein-calorie malnutrition, severe (HCC)   Prostate cancer (HCC)   Nausea without vomiting  Small bowel obstruction in the setting of Crohn's disease, now resolved Tolerating soft diet Positive flatus and stools in colostomy bag GI followed and signed off Recommendations as stated above  History of prostate cancer Previously on Docetaxel Follow-up outpatient  Anemia of chronic disease Hemoglobin is stable at 10.6 based on hemoglobin 11 Follow-up with PCP outpatient  Procedures:  None  Consultations:  GI  General surgery  Discharge Exam: BP 95/65 (BP Location: Right Arm)   Pulse (!) 58   Temp 97.7 F (36.5 C) (Oral)   Resp 12   Ht 5' 4"  (1.626 m)   Wt 67.8 kg (149 lb 7.6 oz)   SpO2 99%   BMI 25.66 kg/m   General: 62 year old African-American male well-developed well-nourished no acute distress. Cardiovascular: Regular rate and rhythm with no rubs or gallops.  No JVD or thyromegaly present. Respiratory: Clear to auscultation with no wheezes or rales.  Good inspiratory effort.  Discharge Instructions You were cared for by a hospitalist during your hospital stay. If  you have any questions about your discharge medications or the care you received while you were in the hospital after you are discharged, you can call the unit and asked to speak with the hospitalist on call if the hospitalist that took care of you is not available. Once you are discharged, your primary care physician will handle any further medical issues.  Please note that NO REFILLS for any discharge medications will be authorized once you are discharged, as it is imperative that you return to your primary care physician (or establish a relationship with a primary care physician if you do not have one) for your aftercare needs so that they can reassess your need for medications and monitor your lab values.   Allergies as of 04/19/2018      Reactions   Penicillins Hives   Has patient had a PCN reaction causing immediate rash, facial/tongue/throat swelling, SOB or lightheadedness with hypotension: Yes Has patient had a PCN reaction causing severe rash involving mucus membranes or skin necrosis: No Has patient had a PCN reaction that required hospitalization: No Has patient had a PCN reaction occurring within the last 10 years: No If all of the above answers are "NO", then may proceed with Cephalosporin use.      Medication List    STOP taking these medications   budesonide 3 MG 24 hr capsule Commonly known as:  ENTOCORT EC   HYDROcodone-acetaminophen 5-325 MG tablet Commonly known as:  NORCO/VICODIN     TAKE these medications   Adalimumab 40 MG/0.8ML Pskt Commonly known as:  HUMIRA Inject 0.8 mLs (40 mg total) into the skin every 14 (fourteen) days. What changed:  when to take this   calcium carbonate 600 MG Tabs tablet Commonly known as:  OS-CAL Take 1,200 mg by mouth daily with breakfast.   CENTRUM SILVER ADULT 50+ Tabs Take 1 tablet by mouth daily.   cyclobenzaprine 5 MG tablet Commonly known as:  FLEXERIL Take 1 tablet (5 mg total) by mouth 3 (three) times daily as needed for muscle spasms.   Ferrous Sulfate 28 MG Tabs Take 1 tablet by mouth daily.   lidocaine-prilocaine cream Commonly known as:  EMLA Apply 1 application topically as needed (when accessing port).   mirtazapine 15 MG tablet Commonly known as:  REMERON TAKE 1 TABLET BY MOUTH AT BEDTIME   predniSONE 20 MG tablet Commonly known as:  DELTASONE Take 2  tablets (40 mg total) by mouth every morning for 7 days, THEN 2 tablets (40 mg total) every morning for 7 days, THEN 1.5 tablets (30 mg total) every morning for 7 days, THEN 1.5 tablets (30 mg total) every morning for 7 days, THEN 1 tablet (20 mg total) every morning for 7 days, THEN 1 tablet (20 mg total) every morning for 7 days, THEN 0.5 tablets (10 mg total) every morning for 7 days, THEN 0.5 tablets (10 mg total) every morning for 7 days. Take 40 mg x 7 days Then, 35 mg x 7 days Then 30 mg x 7 days Then 25 mg x 7 days Then 20 mg x 7 days Then 15 mg x 7 days Then 10 mg x 7 days Then 5 mg x 7 days. Start taking on:  04/19/2018   triamcinolone ointment 0.5 % Commonly known as:  KENALOG Apply 1 application topically 2 (two) times daily. What changed:    when to take this  reasons to take this      Allergies  Allergen Reactions  . Penicillins  Hives    Has patient had a PCN reaction causing immediate rash, facial/tongue/throat swelling, SOB or lightheadedness with hypotension: Yes Has patient had a PCN reaction causing severe rash involving mucus membranes or skin necrosis: No Has patient had a PCN reaction that required hospitalization: No Has patient had a PCN reaction occurring within the last 10 years: No If all of the above answers are "NO", then may proceed with Cephalosporin use.    Follow-up Information    Butler, [name], MD Follow up in 3 day(s).   Specialty:  Family Medicine Contact information: [address] [place] [address] [phone]        Daneil Dolin, MD Follow up in 3 day(s).   Specialty:  Gastroenterology Contact information: [address] [place] [address] [phone]            The results of significant diagnostics from this hospitalization (including imaging, microbiology, ancillary and laboratory) are listed below for reference.    Significant Diagnostic Studies: Ct Abdomen Pelvis W Contrast  Result Date:  04/17/2018 CLINICAL DATA:  Abdominal pain, nausea with vomiting, and diarrhea for 4 days. Small bowel obstruction. Crohn's disease. Prostate carcinoma. EXAM: CT ABDOMEN AND PELVIS WITH CONTRAST TECHNIQUE: Multidetector CT imaging of the abdomen and pelvis was performed using the standard protocol following bolus administration of intravenous contrast. CONTRAST:  158m ISOVUE-300 IOPAMIDOL (ISOVUE-300) INJECTION 61% COMPARISON:  03/16/2018 FINDINGS: Lower Chest: No acute findings. Hepatobiliary:  No hepatic masses identified. Pancreas:  No mass or inflammatory changes. Spleen: Within normal limits in size and appearance. Adrenals/Urinary Tract: No masses identified. No evidence of hydronephrosis. Stomach/Bowel: Minimal increase in diffuse small bowel dilatation is seen. Transition point is most likely in the right anterior abdomen. There is mild to moderate wall thickening of small bowel loops seen in this region, with adjacent mesenteric soft tissue stranding, most consistent with active Crohn's disease. No abscess is identified. There is also a large left abdominal wall spigelian hernia which contains multiple small bowel loops which show no evidence of dilatation or wall thickening. Vascular/Lymphatic: Mild right lower quadrant mesenteric lymphadenopathy with lymph nodes measuring up to 1 cm, not significantly changed. Lymphadenopathy is seen in the right posterior gluteal region in the region of the ischial tuberosity which is also stable, largest lymph node measuring 2.0 cm on image 71/3. No retroperitoneal lymphadenopathy identified. No abdominal aortic aneurysm. Reproductive: A lobulated soft tissue mass with internal calcifications is seen in the right pelvic floor soft tissues, which involves the base of the bladder and region of the prostate. This measures 5.4 x 3.9 cm on image 69/3, without significant change compared to prior study. Other:  None. Musculoskeletal: Sclerotic bone metastases throughout the  pelvis show no significant change. IMPRESSION: Similar high grade small bowel obstruction, with transition point in region of thickened small bowel loops just proximal to right lower quadrant ileostomy, most consistent with active Crohn's disease. No evidence of abscess or bowel perforation. 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 Stable mild lymphadenopathy in the right lower quadrant mesentery and right posterior gluteal soft tissues. Stable right pelvic floor soft tissue mass involving region of prostate gland and bladder base. Electronically Signed   By: JEarle GellM.D.   On: 04/17/2018 07:45    Microbiology: No results found for this or any previous visit (from the past 240 hour(s)).   Labs: Basic Metabolic Panel: Recent Labs  Lab 04/14/18 1010 04/16/18 2210 04/18/18 09150  NA 141 137 138  K 3.8 4.3 4.0  CL 106 102 106  CO2 24 26 25   GLUCOSE 100 95 93  BUN 13 15 9   CREATININE 1.10 1.22 1.06  CALCIUM 9.1 9.2 8.6*   Liver Function Tests: Recent Labs  Lab 04/14/18 1010 04/16/18 2210  AST 16 23  ALT 11 14*  ALKPHOS 56 58  BILITOT 0.4 1.0  PROT 7.0 7.5  ALBUMIN 3.1* 3.4*   Recent Labs  Lab 04/16/18 2210  LIPASE 31   No results for input(s): AMMONIA in the last 168 hours. CBC: Recent Labs  Lab 04/14/18 1010 04/16/18 2210 04/18/18 0711  WBC 6.1 8.9 7.1  NEUTROABS 3.5  --   --   HGB 10.7* 11.6* 10.8*  HCT 31.7* 35.2* 33.3*  MCV 96.7 97.0 96.0  PLT 277 300 293   Cardiac Enzymes: No results for input(s): CKTOTAL, CKMB, CKMBINDEX, TROPONINI in the last 168 hours. BNP: BNP (last 3 results) No results for input(s): BNP in the last 8760 hours.  ProBNP (last 3 results) No results for input(s): PROBNP in the last 8760 hours.  CBG: No results for input(s): GLUCAP in the last 168 hours.     Signed:  Kayleen Memos, MD [facility] 04/19/2018, 10:34 AM

## 2018-04-23 LAB — MISC LABCORP TEST (SEND OUT): Labcorp test code: 504575

## 2018-05-05 DIAGNOSIS — Z933 Colostomy status: Secondary | ICD-10-CM | POA: Diagnosis not present

## 2018-05-05 DIAGNOSIS — K5090 Crohn's disease, unspecified, without complications: Secondary | ICD-10-CM | POA: Diagnosis not present

## 2018-05-10 ENCOUNTER — Telehealth: Payer: Self-pay | Admitting: *Deleted

## 2018-05-10 NOTE — Telephone Encounter (Signed)
Received call from [place], [facility].  Reports that patient was recently discharged from hospital for small bowel obstruction. States that she has been trying to contact patient but has been unsuccessful. Inquired as to if patient has had F/U. No appointment noted.   Call placed to patient to schedule F/U. [facility].

## 2018-05-11 DIAGNOSIS — C7951 Secondary malignant neoplasm of bone: Secondary | ICD-10-CM | POA: Diagnosis not present

## 2018-05-11 DIAGNOSIS — C61 Malignant neoplasm of prostate: Secondary | ICD-10-CM | POA: Diagnosis not present

## 2018-05-12 NOTE — Telephone Encounter (Signed)
Call placed to patient. LMTRC.  

## 2018-05-13 NOTE — Telephone Encounter (Signed)
Multiple calls placed to patient with no answer and no return call.   Message to be closed.  

## 2018-05-18 ENCOUNTER — Encounter: Payer: Self-pay | Admitting: Family Medicine

## 2018-05-18 ENCOUNTER — Ambulatory Visit (INDEPENDENT_AMBULATORY_CARE_PROVIDER_SITE_OTHER): Payer: Medicare Other | Admitting: Family Medicine

## 2018-05-18 ENCOUNTER — Other Ambulatory Visit: Payer: Self-pay

## 2018-05-18 VITALS — BP 117/82 | HR 78 | Temp 98.1°F | Resp 14 | Ht 62.0 in | Wt 153.0 lb

## 2018-05-18 DIAGNOSIS — K50819 Crohn's disease of both small and large intestine with unspecified complications: Secondary | ICD-10-CM | POA: Diagnosis not present

## 2018-05-18 DIAGNOSIS — C61 Malignant neoplasm of prostate: Secondary | ICD-10-CM

## 2018-05-18 MED ORDER — HYDROCODONE-ACETAMINOPHEN 5-325 MG PO TABS: 1.0000 | ORAL_TABLET | Freq: Four times a day (QID) | ORAL | 0 refills | Status: DC | PRN

## 2018-05-18 NOTE — Assessment & Plan Note (Signed)
Recurrent SBO past few months F/u July with GI Continue current meds Hold remeron with steroids ,he is maintaining weight

## 2018-05-18 NOTE — Assessment & Plan Note (Signed)
revewied oncology note Metastatic cancer On androgen deprivation norco refilled

## 2018-05-18 NOTE — Progress Notes (Signed)
   Subjective:    Patient ID: Daniel Howard, male    DOB: 1956-03-04, 62 y.o.   MRN: [id number]  Patient presents for Hospital F/U (SBO)  Pt here for hospital follow-up, he was admitted back April 26th with SBO. He has prostate cancer with bony metasisis and crohns disease with colostomy. He was treated with IVF, bowel rest His GI changed him from budesinide to prednisone regimen, continued on Humira  Takes 1 tablet BID and 1/2 tablet at night    Not taking reermon as steroids made him eat more  Told by [facility] GI[facility]Surgery- that operating again is only result    Stage 4 Prostate cancer- getting shots urologist and lab visit   Takes 1 shot every month, other shot every 4 months Xgeva    Due for refill on pain meds  Norco 5-325 #60   Review Of Systems:  GEN- denies fatigue, fever, weight loss,weakness, recent illness HEENT- denies eye drainage, change in vision, nasal discharge, CVS- denies chest pain, palpitations RESP- denies SOB, cough, wheeze ABD- denies N/V, change in stools, abd pain GU- denies dysuria, hematuria, dribbling, incontinence MSK- denies joint pain, muscle aches, injury Neuro- denies headache, dizziness, syncope, seizure activity       Objective:    BP 117/82   Pulse 78   Temp 98.1 F (36.7 C) (Oral)   Resp 14   Ht 5' 2"  (1.575 m)   Wt 153 lb (69.4 kg)   SpO2 98%   BMI 27.98 kg/m  GEN- NAD, alert and oriented x3 HEENT- PERRL, EOMI, non injected sclera, pink conjunctiva, MMM, oropharynx clear Neck- Supple, no thyromegaly CVS- RRR, no murmur RESP-CTAB ABD-NABS,soft,NT,ND, colostomy in tact EXT- No edema Pulses- Radial  2+        Assessment & Plan:      Problem List Items Addressed This Visit      Unprioritized   Prostate cancer ([place])    revewied oncology note Metastatic cancer On androgen deprivation norco refilled      Crohn's disease of both small and large intestine with complication ([place]) - Primary    Recurrent SBO past few  months F/u July with GI Continue current meds Hold remeron with steroids ,he is maintaining weight          Note: This dictation was prepared with Dragon dictation along with smaller Company secretary. Any transcriptional errors that result from this process are unintentional.

## 2018-05-18 NOTE — Patient Instructions (Addendum)
Cancel appt for July, MOVE To August

## 2018-05-26 ENCOUNTER — Inpatient Hospital Stay: Payer: Medicare Other | Attending: Oncology

## 2018-05-26 DIAGNOSIS — C61 Malignant neoplasm of prostate: Secondary | ICD-10-CM | POA: Insufficient documentation

## 2018-05-26 DIAGNOSIS — Z452 Encounter for adjustment and management of vascular access device: Secondary | ICD-10-CM | POA: Diagnosis not present

## 2018-05-26 MED ORDER — SODIUM CHLORIDE 0.9% FLUSH
10.0000 mL | INTRAVENOUS | Status: DC | PRN
  Administered 2018-05-26: 10 mL
  Filled 2018-05-26: qty 10

## 2018-05-26 MED ORDER — HEPARIN SOD (PORK) LOCK FLUSH 100 UNIT/ML IV SOLN
500.0000 [IU] | Freq: Once | INTRAVENOUS | Status: AC | PRN
Start: 2018-05-26 — End: 2018-05-26
  Administered 2018-05-26: 500 [IU]
  Filled 2018-05-26: qty 5

## 2018-05-27 ENCOUNTER — Encounter: Payer: Self-pay | Admitting: Nurse Practitioner

## 2018-06-03 DIAGNOSIS — Z933 Colostomy status: Secondary | ICD-10-CM | POA: Diagnosis not present

## 2018-06-03 DIAGNOSIS — K5090 Crohn's disease, unspecified, without complications: Secondary | ICD-10-CM | POA: Diagnosis not present

## 2018-06-11 DIAGNOSIS — C7951 Secondary malignant neoplasm of bone: Secondary | ICD-10-CM | POA: Diagnosis not present

## 2018-06-11 DIAGNOSIS — C61 Malignant neoplasm of prostate: Secondary | ICD-10-CM | POA: Diagnosis not present

## 2018-06-14 ENCOUNTER — Emergency Department (HOSPITAL_COMMUNITY)
Admission: EM | Admit: 2018-06-14 | Discharge: 2018-06-15 | Disposition: A | Discharge status: Home / Self Care | Payer: Medicare Other | Attending: Emergency Medicine | Admitting: Emergency Medicine

## 2018-06-14 ENCOUNTER — Emergency Department (HOSPITAL_COMMUNITY): Payer: Medicare Other

## 2018-06-14 ENCOUNTER — Encounter (HOSPITAL_COMMUNITY): Payer: Self-pay | Admitting: Emergency Medicine

## 2018-06-14 DIAGNOSIS — Z87891 Personal history of nicotine dependence: Secondary | ICD-10-CM | POA: Insufficient documentation

## 2018-06-14 DIAGNOSIS — R51 Headache: Secondary | ICD-10-CM | POA: Diagnosis not present

## 2018-06-14 DIAGNOSIS — G44209 Tension-type headache, unspecified, not intractable: Secondary | ICD-10-CM

## 2018-06-14 DIAGNOSIS — Z79899 Other long term (current) drug therapy: Secondary | ICD-10-CM | POA: Insufficient documentation

## 2018-06-14 LAB — CBC
HEMATOCRIT: 35.5 % — AB (ref 39.0–52.0)
HEMOGLOBIN: 11.5 g/dL — AB (ref 13.0–17.0)
MCH: 32.6 pg (ref 26.0–34.0)
MCHC: 32.4 g/dL (ref 30.0–36.0)
MCV: 100.6 fL — AB (ref 78.0–100.0)
Platelets: 278 10*3/uL (ref 150–400)
RBC: 3.53 MIL/uL — AB (ref 4.22–5.81)
RDW: 13 % (ref 11.5–15.5)
WBC: 8.1 10*3/uL (ref 4.0–10.5)

## 2018-06-14 LAB — COMPREHENSIVE METABOLIC PANEL
ALBUMIN: 2.9 g/dL — AB (ref 3.5–5.0)
ALK PHOS: 51 U/L (ref 38–126)
ALT: 21 U/L (ref 17–63)
AST: 30 U/L (ref 15–41)
Anion gap: 9 (ref 5–15)
BUN: 13 mg/dL (ref 6–20)
CALCIUM: 9.1 mg/dL (ref 8.9–10.3)
CO2: 26 mmol/L (ref 22–32)
Chloride: 104 mmol/L (ref 101–111)
Creatinine, Ser: 1.27 mg/dL — ABNORMAL HIGH (ref 0.61–1.24)
GFR calc Af Amer: >60 mL/min (ref >60)
GFR calc non Af Amer: 60 mL/min — ABNORMAL LOW (ref >60)
GLUCOSE: 97 mg/dL (ref 65–99)
POTASSIUM: 3.4 mmol/L — AB (ref 3.5–5.1)
SODIUM: 139 mmol/L (ref 135–145)
Total Bilirubin: 1 mg/dL (ref 0.3–1.2)
Total Protein: 6.7 g/dL (ref 6.5–8.1)

## 2018-06-14 LAB — DIFFERENTIAL
ABS IMMATURE GRANULOCYTES: 0.1 10*3/uL (ref 0.0–0.1)
BASOS ABS: 0 10*3/uL (ref 0.0–0.1)
Basophils Relative: 1 %
EOS PCT: 1 %
Eosinophils Absolute: 0.1 10*3/uL (ref 0.0–0.7)
IMMATURE GRANULOCYTES: 1 %
LYMPHS PCT: 29 %
Lymphs Abs: 2.3 10*3/uL (ref 0.7–4.0)
Monocytes Absolute: 1 10*3/uL (ref 0.1–1.0)
Monocytes Relative: 12 %
Neutro Abs: 4.6 10*3/uL (ref 1.7–7.7)
Neutrophils Relative %: 56 %

## 2018-06-14 NOTE — ED Triage Notes (Signed)
Pt states he has had 3-4 days of pressure to his eyes/front of head. Pt also had an episode where he lost the grip of the steering wheel to the right hand. No drifts noted. Denies N/V. Pt also has pain to his neck.

## 2018-06-14 NOTE — ED Provider Notes (Signed)
Patient placed in Quick Look pathway, seen and evaluated   Chief Complaint: headache, pressure, right hand weakness  HPI:   TAGG EUSTICE is a 62 y.o. male who present to the ED with head pressure that feels like a tight band around his head that started 3 or 4 days ago. Patient also reports that yesterday while driving his right arm got weak and it dropped off the steering wheel.   ROS: Neuro: headache, weakness right arm yesterday but none today.   Physical Exam:  BP 119/77 (BP Location: Right Arm)   Pulse 96   Temp 98.9 F (37.2 C) (Oral)   Resp 16   SpO2 98%    Gen: No distress  Neuro: Awake and Alert, no pronator drift, grips are equal, radial pulses 2+, adequate circulation  Skin: Warm and dry    Initiation of care has begun. The patient has been counseled on the process, plan, and necessity for staying for the completion/evaluation, and the remainder of the medical screening examination    Ashley Murrain, NP 06/14/18 [place], [place], DO 06/14/18 2319

## 2018-06-15 MED ORDER — ACETAMINOPHEN 500 MG PO TABS: 1000.0000 mg | ORAL_TABLET | Freq: Once | ORAL | Status: DC

## 2018-06-15 NOTE — ED Provider Notes (Signed)
[facility] EMERGENCY DEPARTMENT Provider Note  CSN: [id number] Arrival date & time: 06/14/18 1947  Chief Complaint(s) Headache  HPI Daniel Howard is a 62 y.o. male   The history is provided by the patient.  Headache   This is a new problem. Episode onset: 4 days. Episode frequency: intermittent. Progression since onset: fluctuating. The headache is associated with nothing. The pain is located in the bilateral and temporal region. The quality of the pain is described as dull. The pain is mild. The pain does not radiate. Pertinent negatives include no fever, no malaise/fatigue, no chest pressure, no near-syncope, no palpitations, no shortness of breath, no nausea and no vomiting. He has tried nothing for the symptoms.   Patient reported that while driving yesterday he felt his right arm drop off the steering well but quickly was able to lift the back up.  He denied any overt weakness.  States that this lasted for less than 1 second.  Past Medical History Past Medical History:  Diagnosis Date  . Abnormal finding of biliary tract    MRCP shows pancreatic/biliary tract dilation. EUS 2010 confirmed dilation but no chronic pancreaitis or mass. Vascular ectasia crimpoing distal CBD.   [name] Anxiety   . 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 Duodenal ulcer 2010   nsaids  . History of blood transfusion 1992   "related to colon OR"  . Peristomal hernia   . Prostate cancer ([place]) 2018  . Small bowel obstruction ([place]) 10/2017; 11/21/2017; 02/16/2018  . Spigelian hernia    bilateral   Patient Active Problem List   Diagnosis Date Noted  .  Nausea without vomiting 11/10/2017  . Spigelian hernia 09/22/2017  . Abdominal pain of multiple sites 09/22/2017  . Goals of care, counseling/discussion 04/02/2017  . Prostate cancer ([place]) 04/02/2017  . Elevated PSA 03/04/2017  . Osteopenia 11/03/2016  . Pelvic mass in male 09/04/2016  . 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 Exacerbation of Crohn's disease ([place]) 09/04/2016  . Protein-calorie malnutrition, severe ([place]) 07/27/2014  . SBO (small bowel obstruction) ([place]) 07/26/2014  . Loss of weight 06/09/2014  . Crohn's disease of both small and large intestine with complication ([place]) 97/94/8016  . Boils 02/11/2013  . Ventral hernia 12/11/2009  . Anemia 12/03/2009  . Regional enteritis/Crohn's 01/25/2007   Home Medication(s) Prior to Admission medications   Medication Sig Start Date End Date Taking? Authorizing Provider  Adalimumab (HUMIRA) 40 MG/0.8ML PSKT Inject 0.8 mLs (40 mg total) into the skin every 14 (fourteen) days. 04/19/18   Kayleen Memos, DO  calcium carbonate (OS-CAL) 600 MG TABS tablet Take 1,200 mg by mouth daily with breakfast.    [provider]  Ferrous Sulfate 28 MG TABS Take 1 tablet by mouth daily.    [provider]  HYDROcodone-acetaminophen (NORCO) 5-325 MG tablet Take 1 tablet by mouth every 6 (six) hours as needed for moderate pain. 05/18/18   Alycia Rossetti, MD  lidocaine-prilocaine (EMLA) cream Apply 1 application topically as needed (when accessing port).    [provider]  Multiple Vitamins-Minerals (CENTRUM SILVER ADULT 50+) TABS Take 1 tablet by mouth daily.    [provider]  triamcinolone ointment (KENALOG)  0.5 % Apply 1 application topically 2 (two) times daily. Patient taking differently: Apply 1 application topically 2 (two) times daily as needed (rash).  04/03/18   Dannielle Huh, DO                                                                                                                                    Past  Surgical History Past Surgical History:  Procedure Laterality Date  . APPENDECTOMY  1992   at time of exp laparotomy at which time he was noted to have fistulizing Crohn's rather than UC  . COLON SURGERY    . COLONOSCOPY [name] 08/23/2014   MPN:[id number] proctoscopy with possible fistulous opening in thebase of rectal/anal stump.    . COLOSTOMY  1992   transverse loop colostomy secondary to a stricture  . ESOPHAGOGASTRODUODENOSCOPY  05/2009   SLF: multiple antral erosions, large ulcer at ansatomosis (postsurgical changes at duodenal bulb and second portion of duodenum) BX c/x NSAIDS.  . EUS  10/04/2009   Dr. Estill Bakes dilated CBD and main pancreatic duct.  No pancreatic  . EXPLORATORY LAPAROTOMY  1992  . FLEXIBLE SIGMOIDOSCOPY  1988   Dr. Laural Golden- suggested rohn's disease but the biopsies were not collaborative.  [name] FLEXIBLE SIGMOIDOSCOPY [name] 09/08/2016   Procedure: FLEXIBLE SIGMOIDOSCOPY;  Surgeon: Wonda Horner, MD;  Location: [facility] ENDOSCOPY;  Service: Gastroenterology;  Laterality: [name];  . HEMICOLOECTOMY W/ ANASTOMOSIS  1993   R- Dr.DeMason   . HERNIA REPAIR  1996   incarcerated periostial hernia with additional surgery in 1999  . IR FLUORO GUIDE PORT INSERTION RIGHT  04/03/2017  . IR US GUIDE VASC ACCESS RIGHT  04/03/2017   Family History Family History  Problem Relation Age of Onset  . Cancer Father        prostate   . Prostate cancer Father   . Colon cancer Father 52  . Hypertension Sister   . Cancer Sister   . Depression Sister   . Breast cancer Sister   . COPD Sister   . Aneurysm Brother        deceased, brain aneurysm    Social History Social History   Tobacco Use  . Smoking status: Former Smoker    Packs/day: 0.75    Years: 32.00    Pack years: 24.00    Types: Cigarettes    Last attempt to quit: 12/21/2009    Years since quitting: 8.4  . Smokeless tobacco: Never Used  Substance Use Topics  . Alcohol use: No    Alcohol/week: 0.0 oz    Comment: Former drinker  .  Drug use: No   Allergies Penicillins  Review of Systems Review of Systems  Constitutional: Negative for fever and malaise/fatigue.  Respiratory: Negative for shortness of breath.   Cardiovascular: Negative for palpitations and near-syncope.  Gastrointestinal: Negative for nausea and vomiting.  Neurological: Positive for headaches.   All other systems are reviewed and  are negative for acute change except as noted in the HPI  Physical Exam Vital Signs  I have reviewed the triage vital signs BP 114/80 (BP Location: Right Arm)   Pulse (!) 103   Temp 97.8 F (36.6 C) (Oral)   Resp 14   SpO2 98%   Physical Exam  Constitutional: He is oriented to person, place, and time. He appears well-developed and well-nourished. No distress.  HENT:  Head: Normocephalic and atraumatic.  Right Ear: External ear normal.  Left Ear: External ear normal.  Nose: Nose normal.  Mouth/Throat: Mucous membranes are normal. No trismus in the jaw.  Eyes: Conjunctivae and EOM are normal. No scleral icterus.  Neck: Normal range of motion and phonation normal.  Cardiovascular: Normal rate and regular rhythm.  Pulmonary/Chest: Effort normal. No stridor. No respiratory distress.  Abdominal: He exhibits no distension.  Musculoskeletal: Normal range of motion. He exhibits no edema.  Neurological: He is alert and oriented to person, place, and time.  Mental Status:  Alert and oriented to person, place, and time.  Attention and concentration normal.  Speech clear.  Recent memory is intact  Cranial Nerves:  II Visual Fields: Intact to confrontation. Visual fields intact. III, IV, VI: Pupils equal and reactive to light and near. Full eye movement without nystagmus  V Facial Sensation: Normal. No weakness of masticatory muscles  VII: No facial weakness or asymmetry  VIII Auditory Acuity: Grossly normal  IX/X: The uvula is midline; the palate elevates symmetrically  XI: Normal sternocleidomastoid and trapezius  strength  XII: The tongue is midline. No atrophy or fasciculations.   Motor System: Muscle Strength: 5/5 and symmetric in the upper and lower extremities. No pronation or drift.  Muscle Tone: Tone and muscle bulk are normal in the upper and lower extremities.   Reflexes: DTRs: 1+ and symmetrical in all four extremities. No Clonus Coordination: Intact finger-to-nose, heel-to-shin. No tremor.  Sensation: Intact to light touch, and pinprick. Negative Romberg test.  Gait: Routine gait normal.   Skin: He is not diaphoretic.  Psychiatric: He has a normal mood and affect. His behavior is normal.  Vitals reviewed.   ED Results and Treatments Labs (all labs ordered are listed, but only abnormal results are displayed) Labs Reviewed  CBC - Abnormal; Notable for the following components:      Result Value   RBC 3.53 (*)    Hemoglobin 11.5 (*)    HCT 35.5 (*)    MCV 100.6 (*)    All other components within normal limits  COMPREHENSIVE METABOLIC PANEL - Abnormal; Notable for the following components:   Potassium 3.4 (*)    Creatinine, Ser 1.27 (*)    Albumin 2.9 (*)    GFR calc non Af Amer 60 (*)    All other components within normal limits  DIFFERENTIAL  EKG  EKG Interpretation  Date/Time:    Ventricular Rate:    PR Interval:    QRS Duration:   QT Interval:    QTC Calculation:   R Axis:     Text Interpretation:        Radiology Ct Head Wo Contrast  Result Date: 06/14/2018 CLINICAL DATA:  62 year old male with frontal headache. EXAM: CT HEAD WITHOUT CONTRAST TECHNIQUE: Contiguous axial images were obtained from the base of the skull through the vertex without intravenous contrast. COMPARISON:  None. FINDINGS: Brain: The ventricles and sulci are appropriate size for patient's age. The gray-white matter discrimination is preserved. There is no acute intracranial  hemorrhage. No mass effect or midline shift. No extra-axial fluid collection. Vascular: No hyperdense vessel or unexpected calcification. Skull: Normal. Negative for fracture or focal lesion. Sinuses/Orbits: There is mild diffuse mucoperiosteal thickening of paranasal sinuses. No air-fluid levels. Right maxillary sinus retention cyst or polyp. Mastoid air cells are clear. Other: None IMPRESSION: No acute intracranial pathology. Electronically Signed   By: Anner Crete M.D.   On: 06/14/2018 22:18   Pertinent labs & imaging results that were available during my care of the patient were reviewed by me and considered in my medical decision making (see chart for details).  Medications Ordered in ED Medications  acetaminophen (TYLENOL) tablet 1,000 mg (has no administration in time range)                                                                                                                                    Procedures Procedures  (including critical care time)  Medical Decision Making / ED Course I have reviewed the nursing notes for this encounter and the patient's prior records (if available in EHR or on provided paperwork).    Non focal neuro exam. No recent head trauma. No fever. Doubt meningitis. Doubt intracranial bleed. Doubt IIH.   Patient reported brief episode of right arm heaviness yesterday, but does not appear to be consistent with CVA or TIA.  CT head with mild sinusitis. Otherwise unremarkable.  Labs grossly reassuring.  The patient appears reasonably screened and/or stabilized for discharge and I doubt any other medical condition or other [facility] requiring further screening, evaluation, or treatment in the ED at this time prior to discharge.  The patient is safe for discharge with strict return precautions.    Final Clinical Impression(s) / ED Diagnoses Final diagnoses:  Tension headache   Disposition: Discharge  Condition: Good  I have discussed the results, Dx  and Tx plan with the patient who expressed understanding and agree(s) with the plan. Discharge instructions discussed at great length. The patient was given strict return precautions who verbalized understanding of the instructions. No further questions at time of discharge.    ED Discharge Orders    None       Follow Up: [name], [name], MD [address] [place]  [place] [address] [phone]  Schedule an appointment as soon as possible for a visit  As needed     This chart was dictated using voice recognition software.  Despite best efforts to proofread,  errors can occur which can change the documentation meaning.   Fatima Blank, MD 06/15/18 [phone]

## 2018-06-26 ENCOUNTER — Emergency Department (HOSPITAL_COMMUNITY)
Admission: EM | Admit: 2018-06-26 | Discharge: 2018-06-27 | Discharge status: Against Medical Advice | Payer: Medicare Other | Attending: Emergency Medicine | Admitting: Emergency Medicine

## 2018-06-26 ENCOUNTER — Emergency Department (HOSPITAL_COMMUNITY): Payer: Medicare Other

## 2018-06-26 ENCOUNTER — Encounter (HOSPITAL_COMMUNITY): Payer: Self-pay | Admitting: Emergency Medicine

## 2018-06-26 ENCOUNTER — Other Ambulatory Visit: Payer: Self-pay

## 2018-06-26 DIAGNOSIS — Z87891 Personal history of nicotine dependence: Secondary | ICD-10-CM | POA: Diagnosis not present

## 2018-06-26 DIAGNOSIS — Z79899 Other long term (current) drug therapy: Secondary | ICD-10-CM | POA: Insufficient documentation

## 2018-06-26 DIAGNOSIS — R0609 Other forms of dyspnea: Secondary | ICD-10-CM | POA: Diagnosis not present

## 2018-06-26 DIAGNOSIS — R0602 Shortness of breath: Secondary | ICD-10-CM | POA: Diagnosis not present

## 2018-06-26 DIAGNOSIS — R0600 Dyspnea, unspecified: Secondary | ICD-10-CM | POA: Diagnosis not present

## 2018-06-26 NOTE — ED Triage Notes (Signed)
Pt states he has shortness of breath with exertion. This has been going on a while but his wife wanted him to be evaluated.

## 2018-06-27 DIAGNOSIS — R0609 Other forms of dyspnea: Secondary | ICD-10-CM | POA: Diagnosis not present

## 2018-06-27 LAB — CBC WITH DIFFERENTIAL/PLATELET
ABS IMMATURE GRANULOCYTES: 0.1 10*3/uL (ref 0.0–0.1)
BASOS ABS: 0.1 10*3/uL (ref 0.0–0.1)
Basophils Relative: 1 %
Eosinophils Absolute: 0.2 10*3/uL (ref 0.0–0.7)
Eosinophils Relative: 2 %
HCT: 34.6 % — ABNORMAL LOW (ref 39.0–52.0)
Hemoglobin: 11 g/dL — ABNORMAL LOW (ref 13.0–17.0)
IMMATURE GRANULOCYTES: 1 %
Lymphocytes Relative: 37 %
Lymphs Abs: 3.5 10*3/uL (ref 0.7–4.0)
MCH: 31.8 pg (ref 26.0–34.0)
MCHC: 31.8 g/dL (ref 30.0–36.0)
MCV: 100 fL (ref 78.0–100.0)
MONOS PCT: 11 %
Monocytes Absolute: 1.1 10*3/uL — ABNORMAL HIGH (ref 0.1–1.0)
NEUTROS ABS: 4.6 10*3/uL (ref 1.7–7.7)
NEUTROS PCT: 48 %
PLATELETS: 340 10*3/uL (ref 150–400)
RBC: 3.46 MIL/uL — AB (ref 4.22–5.81)
RDW: 13.1 % (ref 11.5–15.5)
WBC: 9.5 10*3/uL (ref 4.0–10.5)

## 2018-06-27 LAB — COMPREHENSIVE METABOLIC PANEL
ALBUMIN: 3 g/dL — AB (ref 3.5–5.0)
ALT: 15 U/L (ref 0–44)
ANION GAP: 11 (ref 5–15)
AST: 28 U/L (ref 15–41)
Alkaline Phosphatase: 45 U/L (ref 38–126)
BILIRUBIN TOTAL: 0.8 mg/dL (ref 0.3–1.2)
BUN: 14 mg/dL (ref 6–20)
CO2: 21 mmol/L — ABNORMAL LOW (ref 22–32)
Calcium: 8.7 mg/dL — ABNORMAL LOW (ref 8.9–10.3)
Chloride: 110 mmol/L (ref 98–111)
Creatinine, Ser: 1.13 mg/dL (ref 0.61–1.24)
GFR calc non Af Amer: >60 mL/min (ref >60)
GLUCOSE: 81 mg/dL (ref 70–99)
POTASSIUM: 3.7 mmol/L (ref 3.5–5.1)
SODIUM: 142 mmol/L (ref 135–145)
TOTAL PROTEIN: 6.7 g/dL (ref 6.5–8.1)

## 2018-06-27 LAB — BRAIN NATRIURETIC PEPTIDE: B Natriuretic Peptide: 24.5 pg/mL (ref 0.0–100.0)

## 2018-06-27 LAB — TROPONIN I

## 2018-06-27 MED ORDER — IOPAMIDOL (ISOVUE-370) INJECTION 76%
INTRAVENOUS | Status: AC
  Filled 2018-06-27: qty 100

## 2018-06-27 MED ORDER — GI COCKTAIL ~~LOC~~: 30.0000 mL | Freq: Once | ORAL | Status: DC

## 2018-06-27 NOTE — ED Notes (Signed)
Ambulated in hall while checking O2 sat.  Noted to be 98% throughout walk.  Denied having any sob.  Breathing even and nonlabored.

## 2018-06-27 NOTE — ED Provider Notes (Addendum)
[facility] EMERGENCY DEPARTMENT Provider Note   CSN: [id number] Arrival date & time: 06/26/18  2308     History   Chief Complaint Chief Complaint  Patient presents with  . Shortness of Breath    HPI Daniel Howard is a 62 y.o. male.  The patient is here at the request of his wife for evaluation of exertional dyspnea that he states has been on and off for a year. No chest pain, LE edema, fever, cough, nausea, vomiting or diaphoresis. Last episode of SOB with walking was last night. He has no other complaints.   The history is provided by the patient. No language interpreter was used.    Past Medical History:  Diagnosis Date  . Abnormal finding of biliary tract    MRCP shows pancreatic/biliary tract dilation. EUS 2010 confirmed dilation but no chronic pancreaitis or mass. Vascular ectasia crimpoing distal CBD.   [name] Anxiety   . 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 Duodenal ulcer 2010   nsaids  . History of blood transfusion 1992   "related to colon OR"  . Peristomal hernia   . Prostate cancer ([place]) 2018  . Small bowel obstruction ([place]) 10/2017; 11/21/2017; 02/16/2018  . Spigelian hernia    bilateral    Patient Active Problem List   Diagnosis Date Noted  . Nausea without vomiting 11/10/2017  . Spigelian hernia 09/22/2017  . Abdominal pain of multiple sites 09/22/2017  . Goals of care, counseling/discussion 04/02/2017  . Prostate cancer ([place]) 04/02/2017  . Elevated PSA 03/04/2017  . Osteopenia 11/03/2016  . Pelvic mass in male 09/04/2016  . 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 Exacerbation of Crohn's disease  ([place]) 09/04/2016  . Protein-calorie malnutrition, severe ([place]) 07/27/2014  . SBO (small bowel obstruction) ([place]) 07/26/2014  . Loss of weight 06/09/2014  . Crohn's disease of both small and large intestine with complication ([place]) 23/76/2831  . Boils 02/11/2013  . Ventral hernia 12/11/2009  . Anemia 12/03/2009  . Regional enteritis/Crohn's 01/25/2007    Past Surgical History:  Procedure Laterality Date  . APPENDECTOMY  1992   at time of exp laparotomy at which time he was noted to have fistulizing Crohn's rather than UC  . COLON SURGERY    . COLONOSCOPY [name] 08/23/2014   DVV:[id number] proctoscopy with possible fistulous opening in thebase of rectal/anal stump.    . COLOSTOMY  1992   transverse loop colostomy secondary to a stricture  . ESOPHAGOGASTRODUODENOSCOPY  05/2009   SLF: multiple antral erosions, large ulcer at ansatomosis (postsurgical changes at duodenal bulb and second portion of duodenum) BX c/x NSAIDS.  . EUS  10/04/2009   Dr. Estill Bakes dilated CBD and main pancreatic duct.  No pancreatic  . EXPLORATORY LAPAROTOMY  1992  . FLEXIBLE SIGMOIDOSCOPY  1988   Dr. Laural Golden- suggested rohn's disease but the biopsies were not collaborative.  [name] FLEXIBLE SIGMOIDOSCOPY [name] 09/08/2016   Procedure: FLEXIBLE SIGMOIDOSCOPY;  Surgeon: Wonda Horner, MD;  Location: [facility] ENDOSCOPY;  Service: Gastroenterology;  Laterality: [name];  . HEMICOLOECTOMY W/ ANASTOMOSIS  1993   R- Dr.DeMason   . HERNIA REPAIR  1996   incarcerated periostial hernia with additional surgery in 1999  . IR FLUORO  GUIDE PORT INSERTION RIGHT  04/03/2017  . IR US GUIDE VASC ACCESS RIGHT  04/03/2017        Home Medications    Prior to Admission medications   Medication Sig Start Date End Date Taking? Authorizing Provider  Adalimumab (HUMIRA) 40 MG/0.8ML PSKT Inject 0.8 mLs (40 mg total) into the skin every 14 (fourteen) days. 04/19/18   Kayleen Memos, DO  calcium carbonate (OS-CAL) 600 MG TABS tablet Take 1,200 mg by mouth  daily with breakfast.    [provider]  Ferrous Sulfate 28 MG TABS Take 1 tablet by mouth daily.    [provider]  HYDROcodone-acetaminophen (NORCO) 5-325 MG tablet Take 1 tablet by mouth every 6 (six) hours as needed for moderate pain. 05/18/18   Alycia Rossetti, MD  lidocaine-prilocaine (EMLA) cream Apply 1 application topically as needed (when accessing port).    [provider]  Multiple Vitamins-Minerals (CENTRUM SILVER ADULT 50+) TABS Take 1 tablet by mouth daily.    [provider]  triamcinolone ointment (KENALOG) 0.5 % Apply 1 application topically 2 (two) times daily. Patient taking differently: Apply 1 application topically 2 (two) times daily as needed (rash).  04/03/18   Dannielle Huh, DO    Family History Family History  Problem Relation Age of Onset  . Cancer Father        prostate   . Prostate cancer Father   . Colon cancer Father 84  . Hypertension Sister   . Cancer Sister   . Depression Sister   . Breast cancer Sister   . COPD Sister   . Aneurysm Brother        deceased, brain aneurysm    Social History Social History   Tobacco Use  . Smoking status: Former Smoker    Packs/day: 0.75    Years: 32.00    Pack years: 24.00    Types: Cigarettes    Last attempt to quit: 12/21/2009    Years since quitting: 8.5  . Smokeless tobacco: Never Used  Substance Use Topics  . Alcohol use: No    Alcohol/week: 0.0 oz    Comment: Former drinker  . Drug use: No     Allergies   Penicillins   Review of Systems Review of Systems  Constitutional: Negative for chills and fever.  HENT: Negative.   Respiratory: Positive for shortness of breath.   Cardiovascular: Negative.  Negative for chest pain and leg swelling.  Gastrointestinal: Negative.   Musculoskeletal: Negative.   Skin: Negative.   Neurological: Negative.      Physical Exam Updated Vital Signs BP 98/62   Pulse 84   Temp 98.2 F (36.8 C) (Oral)   SpO2 96%    Physical Exam  Constitutional: He is oriented to person, place, and time. He appears well-developed and well-nourished.  HENT:  Head: Normocephalic.  Neck: Normal range of motion. Neck supple.  Cardiovascular: Normal rate and regular rhythm.  Pulmonary/Chest: Effort normal and breath sounds normal. He has no wheezes. He has no rhonchi. He has no rales.  Abdominal: Soft. Bowel sounds are normal. There is no tenderness. There is no rebound and no guarding.  Musculoskeletal: Normal range of motion.       Right lower leg: He exhibits no edema.       Left lower leg: He exhibits no edema.  Neurological: He is alert and oriented to person, place, and time.  Skin: Skin is warm and dry. No rash noted.  Psychiatric: He has a  normal mood and affect.     ED Treatments / Results  Labs (all labs ordered are listed, but only abnormal results are displayed) Labs Reviewed  CBC WITH DIFFERENTIAL/PLATELET - Abnormal; Notable for the following components:      Result Value   RBC 3.46 (*)    Hemoglobin 11.0 (*)    HCT 34.6 (*)    Monocytes Absolute 1.1 (*)    All other components within normal limits  COMPREHENSIVE METABOLIC PANEL  BRAIN NATRIURETIC PEPTIDE  TROPONIN I   Results for orders placed or performed during the hospital encounter of 06/26/18  CBC with Differential  Result Value Ref Range   WBC 9.5 4.0 - 10.5 K/uL   RBC 3.46 (L) 4.22 - 5.81 MIL/uL   Hemoglobin 11.0 (L) 13.0 - 17.0 g/dL   HCT 34.6 (L) 39.0 - 52.0 %   MCV 100.0 78.0 - 100.0 fL   MCH 31.8 26.0 - 34.0 pg   MCHC 31.8 30.0 - 36.0 g/dL   RDW 13.1 11.5 - 15.5 %   Platelets 340 150 - 400 K/uL   Neutrophils Relative % 48 %   Neutro Abs 4.6 1.7 - 7.7 K/uL   Lymphocytes Relative 37 %   Lymphs Abs 3.5 0.7 - 4.0 K/uL   Monocytes Relative 11 %   Monocytes Absolute 1.1 (H) 0.1 - 1.0 K/uL   Eosinophils Relative 2 %   Eosinophils Absolute 0.2 0.0 - 0.7 K/uL   Basophils Relative 1 %   Basophils Absolute 0.1 0.0 - 0.1 K/uL    Immature Granulocytes 1 %   Abs Immature Granulocytes 0.1 0.0 - 0.1 K/uL  Comprehensive metabolic panel  Result Value Ref Range   Sodium 142 135 - 145 mmol/L   Potassium 3.7 3.5 - 5.1 mmol/L   Chloride 110 98 - 111 mmol/L   CO2 21 (L) 22 - 32 mmol/L   Glucose, Bld 81 70 - 99 mg/dL   BUN 14 6 - 20 mg/dL   Creatinine, Ser 1.13 0.61 - 1.24 mg/dL   Calcium 8.7 (L) 8.9 - 10.3 mg/dL   Total Protein 6.7 6.5 - 8.1 g/dL   Albumin 3.0 (L) 3.5 - 5.0 g/dL   AST 28 15 - 41 U/L   ALT 15 0 - 44 U/L   Alkaline Phosphatase 45 38 - 126 U/L   Total Bilirubin 0.8 0.3 - 1.2 mg/dL   GFR calc non Af Amer >60 >60 mL/min   GFR calc Af Amer >60 >60 mL/min   Anion gap 11 5 - 15  Troponin I  Result Value Ref Range   Troponin I <0.03 <0.03 ng/mL     EKG EKG Interpretation  Date/Time:  Sunday June 27 2018 05:26:01 EDT Ventricular Rate:  79 PR Interval:    QRS Duration: 72 QT Interval:  388 QTC Calculation: 445 R Axis:   39 Text Interpretation:  Sinus rhythm Left ventricular hypertrophy Anterior Q waves, possibly due to LVH No significant change was found Confirmed by Ezequiel Essex [phone]) on 06/27/2018 5:31:35 AM   Radiology Dg Chest 2 View  Result Date: 06/26/2018 CLINICAL DATA:  Short of breath EXAM: CHEST - 2 VIEW COMPARISON:  12/18/2017, 12/11/2017, FINDINGS: Right-sided central venous port tip over the distal SVC. Hyperinflation with scarring at the bilateral lung bases and chronic bronchitic changes. Normal cardiomediastinal silhouette. No pneumothorax. IMPRESSION: No active cardiopulmonary disease. Similar appearance of hyperinflation, chronic bronchitic changes and scarring in the bases. Electronically Signed   By: Madie Reno.D.  On: 06/26/2018 23:48    Procedures Procedures (including critical care time)  Medications Ordered in ED Medications - No data to display   Initial Impression / Assessment and Plan / ED Course  I have reviewed the triage vital signs and the nursing  notes.  Pertinent labs & imaging results that were available during my care of the patient were reviewed by me and considered in my medical decision making (see chart for details).     Patient presents with SOB with exertion x 1 year. No CP, fever, cough. He reports the symptoms are no worse now than over the last year.   Labs are nonconcerning. CXR without edema or infiltrates. EKG without acute change. He ambulates here without SOB and maintains 98% O2 saturation.   Patient is seen by Dr. Wyvonnia Dusky who feels he would benefit from a CT r/o PE given risk factors. If normal, he can be discharged home with recommendation for outpatient stress testing.   The patient refuses CTA to evaluate for PE. He can be discharged home AMA to follow up with PCP for further evaluation.  Final Clinical Impressions(s) / ED Diagnoses   Final diagnoses:  None   1. Chronic DOE  ED Discharge Orders    None       Charlann Lange, PA-C 06/27/18 [id number]    Charlann Lange, PA-C 06/27/18 0650    Charlann Lange, PA-C 06/27/18 Merryl Hacker    Ezequiel Essex, MD 06/27/18 0855    Charlann Lange, PA-C 07/09/18 2409    Ezequiel Essex, MD 07/09/18 [phone]

## 2018-06-27 NOTE — ED Notes (Signed)
Refusing CT scan.  States we have to get to church so we are trying to get out of here.  Provider notified.

## 2018-06-27 NOTE — ED Provider Notes (Signed)
Medical screening examination/treatment/procedure(s) were conducted as a shared visit with non-physician practitioner(s) and myself.  I personally evaluated the patient during the encounter.  Attestation: Medical screening examination/treatment/procedure(s) were conducted as a shared visit with non-physician practitioner(s) and myself.  I personally evaluated the patient during the encounter.  Hx metastatic prostrate cancer and Crohn's with intermittent dyspnea on exertion for 3 months. Occurs after walking short distances at times but can walk longer distances at other times without a problem. No CP, cough, fever, leg swelling. Came tonight because family insisted. Never had a cardiac workup. No hx CHF.  Lungs clear. EKG nsr. Labs reassuring.   Advised patient should have CTPE given his history of cancer with dyspnea. Consider CAD as well but would benefit from outpatient stress test.  Patient anxious to leave to go to church. Appears to have capacity to refuse treatment. Understands that CAD or PE can be life threatening. He elects to level AMA.  EKG: EKG Interpretation  Date/Time:  Sunday June 27 2018 05:26:01 EDT Ventricular Rate:  79 PR Interval:    QRS Duration: 72 QT Interval:  388 QTC Calculation: 445 R Axis:   39 Text Interpretation:  Sinus rhythm Left ventricular hypertrophy Anterior Q waves, possibly due to LVH No significant change was found Confirmed by Ezequiel Essex [phone]) on 06/27/2018 5:31:35 AM    EKG Interpretation  Date/Time:  Sunday June 27 2018 05:26:01 EDT Ventricular Rate:  79 PR Interval:    QRS Duration: 72 QT Interval:  388 QTC Calculation: 445 R Axis:   39 Text Interpretation:  Sinus rhythm Left ventricular hypertrophy Anterior Q waves, possibly due to LVH No significant change was found Confirmed by Ezequiel Essex [phone]) on 06/27/2018 5:31:35 AM    Ezequiel Essex, MD 06/27/18 [phone]

## 2018-06-28 ENCOUNTER — Ambulatory Visit: Payer: Medicare Other | Admitting: Nurse Practitioner

## 2018-06-29 ENCOUNTER — Other Ambulatory Visit: Payer: Self-pay | Admitting: Family Medicine

## 2018-06-29 MED ORDER — HYDROCODONE-ACETAMINOPHEN 5-325 MG PO TABS: 1.0000 | ORAL_TABLET | Freq: Four times a day (QID) | ORAL | 0 refills | Status: DC | PRN

## 2018-06-29 NOTE — Telephone Encounter (Signed)
Refill on hydrocodone to [facility].

## 2018-06-29 NOTE — Telephone Encounter (Signed)
Ok to refill??  Last office visit/ refill 05/18/2018.

## 2018-07-01 ENCOUNTER — Ambulatory Visit (INDEPENDENT_AMBULATORY_CARE_PROVIDER_SITE_OTHER): Payer: Medicare Other | Admitting: Nurse Practitioner

## 2018-07-01 ENCOUNTER — Encounter: Payer: Self-pay | Admitting: Nurse Practitioner

## 2018-07-01 VITALS — BP 110/66 | HR 85 | Temp 97.7°F | Ht 64.5 in | Wt 150.8 lb

## 2018-07-01 DIAGNOSIS — K50912 Crohn's disease, unspecified, with intestinal obstruction: Secondary | ICD-10-CM | POA: Diagnosis not present

## 2018-07-01 DIAGNOSIS — K56609 Unspecified intestinal obstruction, unspecified as to partial versus complete obstruction: Secondary | ICD-10-CM

## 2018-07-01 MED ORDER — ADALIMUMAB 40 MG/0.8ML ~~LOC~~ PSKT: 40.0000 mg | PREFILLED_SYRINGE | SUBCUTANEOUS | 3 refills | Status: DC

## 2018-07-01 NOTE — Assessment & Plan Note (Signed)
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  He has had multiple small bowel obstructions in the past 6 months.  His [facility] labs showed non-therapeutic drug level, no antibodies noted.  Based on recommendations by Zacarias Pontes, GI I agree that his Humira likely needs to be increased.  I have sent in a new prescription for Humira 40 mg once a week, he was previously on once every 2 weeks.  He verbalized understanding of the change.  Continue Entocort.  Return for follow-up in 3 months.

## 2018-07-01 NOTE — Assessment & Plan Note (Signed)
The patient was averaging once a month admission for small bowel obstructions with evidence on CT.  At his last discharge he was put on a prednisone taper.  He has not been readmitted to the hospital in about 2 to 3 months.  His SBO's were likely due to Crohn's disease.  At his last hospitalization [facility] labs were drawn which showed decreased drug level and no antibodies.  We will further manage his Crohn's disease as per below.  Discussed ER precautions related to small bowel obstruction and he verbalized understanding.  Follow-up in 3 months.

## 2018-07-01 NOTE — Progress Notes (Signed)
Referring Provider: Alycia Rossetti, MD Primary Care Physician:  Alycia Rossetti, MD Primary GI:  Dr. Gala Romney  Chief Complaint  Patient presents with  . Crohn's Disease    recently had diarrhea    HPI:   Daniel Howard is a 62 y.o. male who presents for follow-up on Crohn's disease.  The patient was last seen in our office 02/02/2018 for Crohn's disease.  History of complicated ileocolonic Crohn's disease, metastatic prostate cancer undergoing androgen deprivation therapy.  He has been maintained on Entocort chronically.  He ran out of his Entocort prior to his previous visit and did not call for refills.  Ostomy functioning well, no abdominal pain, nausea, vomiting at that time.  He had gained 7 pounds and feels well.  He developed MRSA and a suture granuloma site in the abdominal wall and ended up with it on his face which has been effectively treated.  Recommended repeat Entocort at 6 mg daily with 3 refills, avoid NSAIDs, follow-up in 4 months.  The patient presented to the emergency department at [facility] on 02/16/2018.  CT showed features concerning for small bowel obstruction and the patient was admitted for further management.  Findings included small bowel obstruction with history of Crohn's and CT showing hernia that appears to have improved.  Ileostomy bag is full at that time and initially placed on liquid diet and Entocort.  His diet was advanced and he was discharged in satisfactory state.  He was discharged on 02/18/2018.  He was again admitted 03/16/2018 through 03/19/2018 for small bowel obstruction.  He presented to the emergency department with complaints of abdominal pain and imaging revealed another small bowel obstruction likely secondary to adhesions versus Crohn's disease.  He was treated conservatively and his obstruction resolved.  His diet was advanced and he was discharged in satisfactory condition.  Recommended continue Entocort.  He was again admitted to the  hospital from 04/16/2018 through 04/19/2018 for small bowel obstruction.  Noted extensive history of small bowel obstructions, fistula, colostomy who presented with acute abdominal pain and vomiting.  CT in the emergency room showed high-grade obstruction likely from Crohn's disease.  Based on GI recommendations he was discharged on prednisone rather than the desonide as his obstruction occurred while taking 6 mg of budesonide.  They planned a prednisone course of 40 mg a day for 7 days, decreased by 5 mg every 7 days until off (8-week taper).  Continue Humira every 14 days.  Follow-up on adalimumab level and antibody test which was drawn during hospitalization.  [facility] labs came back indicating lower Humira level than expected given that he was on medication.  No antibodies noted.  Recommend discussed with primary GI about possibility of increasing dosing schedule.  Today he states he's doing well overall. Finished steroids. On Humira every other week. He is aware of [facility] results. Denies abdominal pain, N/V, hematochezia, melena, fever, chills, unintentional weight loss. Ostomy functioning well. Denies chest pain, dyspnea, dizziness, lightheadedness, syncope, near syncope. Denies any other upper or lower GI symptoms.  Past Medical History:  Diagnosis Date  . Abnormal finding of biliary tract    MRCP shows pancreatic/biliary tract dilation. EUS 2010 confirmed dilation but no chronic pancreaitis or mass. Vascular ectasia crimpoing distal CBD.   [name] Anxiety   . 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 Duodenal ulcer 2010   nsaids  . History of blood  transfusion 1992   "related to colon OR"  . Peristomal hernia   . Prostate cancer ([place]) 2018  . Small bowel obstruction ([place]) 10/2017; 11/21/2017; 02/16/2018  . Spigelian hernia    bilateral    Past Surgical History:  Procedure Laterality Date  . APPENDECTOMY  1992   at time of exp laparotomy at which time he was noted to have fistulizing Crohn's rather than UC  . COLON SURGERY    . COLONOSCOPY [name] 08/23/2014   [id number] proctoscopy with possible fistulous opening in thebase of rectal/anal stump.    . COLOSTOMY  1992   transverse loop colostomy secondary to a stricture  . ESOPHAGOGASTRODUODENOSCOPY  05/2009   SLF: multiple antral erosions, large ulcer at ansatomosis (postsurgical changes at duodenal bulb and second portion of duodenum) BX c/x NSAIDS.  . EUS  10/04/2009   Dr. Estill Bakes dilated CBD and main pancreatic duct.  No pancreatic  . EXPLORATORY LAPAROTOMY  1992  . FLEXIBLE SIGMOIDOSCOPY  1988   Dr. Laural Golden- suggested rohn's disease but the biopsies were not collaborative.  [name] FLEXIBLE SIGMOIDOSCOPY [name] 09/08/2016   Procedure: FLEXIBLE SIGMOIDOSCOPY;  Surgeon: Wonda Horner, MD;  Location: [facility] ENDOSCOPY;  Service: Gastroenterology;  Laterality: [name];  . HEMICOLOECTOMY W/ ANASTOMOSIS  1993   R- Dr.DeMason   . HERNIA REPAIR  1996   incarcerated periostial hernia with additional surgery in 1999  . IR FLUORO GUIDE PORT INSERTION RIGHT  04/03/2017  . IR US GUIDE VASC ACCESS RIGHT  04/03/2017    Current Outpatient Medications  Medication Sig Dispense Refill  . Adalimumab (HUMIRA) 40 MG/0.8ML PSKT Inject 0.8 mLs (40 mg total) into the skin every 14 (fourteen) days. 4 each 0  . budesonide (ENTOCORT EC) 3 MG 24 hr capsule Take 6 mg by mouth daily.    . calcium carbonate (OS-CAL) 600 MG TABS tablet Take 1,200 mg by mouth daily with breakfast.    . Ferrous Sulfate 28 MG TABS Take 1 tablet by mouth daily.    [name] HYDROcodone-acetaminophen (NORCO) 5-325 MG tablet Take 1 tablet by mouth every  6 (six) hours as needed for moderate pain. 60 tablet 0  . lidocaine-prilocaine (EMLA) cream Apply 1 application topically as needed (when accessing port).    . Multiple Vitamins-Minerals (CENTRUM SILVER ADULT 50+) TABS Take 1 tablet by mouth daily.    [name] triamcinolone ointment (KENALOG) 0.5 % Apply 1 application topically 2 (two) times daily. (Patient taking differently: Apply 1 application topically 2 (two) times daily as needed (rash). ) 30 g 0   No current facility-administered medications for this visit.    Facility-Administered Medications Ordered in Other Visits  Medication Dose Route Frequency Provider Last Rate Last Dose  . sodium chloride flush (NS) 0.9 % injection 10 mL  10 mL Intravenous PRN [name], MD   10 mL at 11/20/17 0981    Allergies as of 07/01/2018 - Review Complete 07/01/2018  Allergen Reaction Noted  . Penicillins Hives 01/25/2007    Family History  Problem Relation Age of Onset  . Cancer Father        prostate   . Prostate cancer Father   . Colon cancer Father 77  . Hypertension Sister   . Cancer Sister   . Depression Sister   . Breast  cancer Sister   . COPD Sister   . Aneurysm Brother        deceased, brain aneurysm    Social History   Socioeconomic History  . Marital status: Married    Spouse name: Not on file  . Number of children: 39  . Years of education: Not on file  . Highest education level: Not on file  Occupational History  . Occupation: disabled    Fish farm manager: UNEMPLOYED  Social Needs  . Financial resource strain: Not on file  . Food insecurity:    Worry: Not on file    Inability: Not on file  . Transportation needs:    Medical: Not on file    Non-medical: Not on file  Tobacco Use  . Smoking status: Former Smoker    Packs/day: 0.75    Years: 32.00    Pack years: 24.00    Types: Cigarettes    Last attempt to quit: 12/21/2009    Years since quitting: 8.5  . Smokeless tobacco: Never Used  Substance and Sexual Activity  .  Alcohol use: No    Alcohol/week: 0.0 oz    Comment: Former drinker  . Drug use: No  . Sexual activity: Never    Partners: Female  Lifestyle  . Physical activity:    Days per week: Not on file    Minutes per session: Not on file  . Stress: Not on file  Relationships  . Social connections:    Talks on phone: Not on file    Gets together: Not on file    Attends religious service: Not on file    Active member of club or organization: Not on file    Attends meetings of clubs or organizations: Not on file    Relationship status: Not on file  Other Topics Concern  . Not on file  Social History Narrative  . Not on file    Review of Systems: General: Negative for anorexia, weight loss, fever, chills, fatigue, weakness. Eyes: Negative for vision changes.  ENT: Negative for hoarseness, difficulty swallowing , nasal congestion. CV: Negative for chest pain, angina, palpitations, dyspnea on exertion, peripheral edema.  Respiratory: Negative for dyspnea at rest, dyspnea on exertion, cough, sputum, wheezing.  GI: See history of present illness. GU:  Negative for dysuria, hematuria, urinary incontinence, urinary frequency, nocturnal urination.  MS: Negative for joint pain, low back pain.  Derm: Negative for rash or itching.  Neuro: Negative for weakness, abnormal sensation, seizure, frequent headaches, memory loss, confusion.  Psych: Negative for anxiety, depression, suicidal ideation, hallucinations.  Endo: Negative for unusual weight change.  Heme: Negative for bruising or bleeding. Allergy: Negative for rash or hives.   Physical Exam: BP 110/66   Pulse 85   Temp 97.7 F (36.5 C) (Oral)   Ht 5' 4.5" (1.638 m)   Wt 150 lb 12.8 oz (68.4 kg)   BMI 25.49 kg/m  General:   Alert and oriented. Pleasant and cooperative. Well-nourished and well-developed.  Head:  Normocephalic and atraumatic. Eyes:  Without icterus, sclera clear and conjunctiva pink.  Ears:  Normal auditory  acuity. Mouth:  No deformity or lesions, oral mucosa pink.  Throat/Neck:  Supple, without mass or thyromegaly. Cardiovascular:  S1, S2 present without murmurs appreciated. Normal pulses noted. Extremities without clubbing or edema. Respiratory:  Clear to auscultation bilaterally. No wheezes, rales, or rhonchi. No distress.  Gastrointestinal:  +BS, soft, non-tender and non-distended. No HSM noted. No guarding or rebound. No masses appreciated.  Rectal:  Deferred  Musculoskalatal:  Symmetrical without gross deformities. Normal posture. Skin:  Intact without significant lesions or rashes. Neurologic:  Alert and oriented x4;  grossly normal neurologically. Psych:  Alert and cooperative. Normal mood and affect. Heme/Lymph/Immune: No significant cervical adenopathy. No excessive bruising noted.    07/01/2018 12:28 PM   Disclaimer: This note was dictated with voice recognition software. Similar sounding words can inadvertently be transcribed and may not be corrected upon review.

## 2018-07-01 NOTE — Patient Instructions (Signed)
1. I sent a new prescription to your specialty pharmacy to increase Humira to every 7 days (previously you were taking it every 14 days). 2. Continue Entocort. 3. Return for follow-up in 3 months. 4. As we discussed, if you have symptoms of a bowel obstruction proceed to the emergency department. 5. Call us if you have any questions or concerns.  At [facility] we value your feedback. You may receive a survey about your visit today. Please share your experience as we strive to create trusting relationships with our patients to provide genuine, compassionate, quality care.  It was great to meet you both today!  I hope you have a wonderful summer!!

## 2018-07-01 NOTE — Progress Notes (Signed)
CC'ED TO PCP 

## 2018-07-02 ENCOUNTER — Inpatient Hospital Stay (HOSPITAL_COMMUNITY)
Admission: EM | Admit: 2018-07-02 | Discharge: 2018-07-06 | DRG: 389 | Disposition: A | Discharge status: Home / Self Care | Payer: Medicare Other | Attending: Internal Medicine | Admitting: Internal Medicine

## 2018-07-02 DIAGNOSIS — M8580 Other specified disorders of bone density and structure, unspecified site: Secondary | ICD-10-CM | POA: Diagnosis present

## 2018-07-02 DIAGNOSIS — Z9221 Personal history of antineoplastic chemotherapy: Secondary | ICD-10-CM

## 2018-07-02 DIAGNOSIS — Z800 Family history of malignant neoplasm of digestive organs: Secondary | ICD-10-CM | POA: Diagnosis not present

## 2018-07-02 DIAGNOSIS — Z87891 Personal history of nicotine dependence: Secondary | ICD-10-CM | POA: Diagnosis not present

## 2018-07-02 DIAGNOSIS — K56609 Unspecified intestinal obstruction, unspecified as to partial versus complete obstruction: Secondary | ICD-10-CM | POA: Diagnosis not present

## 2018-07-02 DIAGNOSIS — Z825 Family history of asthma and other chronic lower respiratory diseases: Secondary | ICD-10-CM | POA: Diagnosis not present

## 2018-07-02 DIAGNOSIS — Z933 Colostomy status: Secondary | ICD-10-CM

## 2018-07-02 DIAGNOSIS — Z8546 Personal history of malignant neoplasm of prostate: Secondary | ICD-10-CM

## 2018-07-02 DIAGNOSIS — K5090 Crohn's disease, unspecified, without complications: Secondary | ICD-10-CM | POA: Diagnosis not present

## 2018-07-02 DIAGNOSIS — Z79899 Other long term (current) drug therapy: Secondary | ICD-10-CM | POA: Diagnosis not present

## 2018-07-02 DIAGNOSIS — D509 Iron deficiency anemia, unspecified: Secondary | ICD-10-CM | POA: Diagnosis not present

## 2018-07-02 DIAGNOSIS — R1084 Generalized abdominal pain: Secondary | ICD-10-CM | POA: Diagnosis not present

## 2018-07-02 DIAGNOSIS — K50819 Crohn's disease of both small and large intestine with unspecified complications: Secondary | ICD-10-CM | POA: Diagnosis not present

## 2018-07-02 DIAGNOSIS — Z803 Family history of malignant neoplasm of breast: Secondary | ICD-10-CM

## 2018-07-02 DIAGNOSIS — K432 Incisional hernia without obstruction or gangrene: Secondary | ICD-10-CM | POA: Diagnosis present

## 2018-07-02 DIAGNOSIS — Z8042 Family history of malignant neoplasm of prostate: Secondary | ICD-10-CM

## 2018-07-02 DIAGNOSIS — C7951 Secondary malignant neoplasm of bone: Secondary | ICD-10-CM | POA: Diagnosis present

## 2018-07-02 DIAGNOSIS — C61 Malignant neoplasm of prostate: Secondary | ICD-10-CM | POA: Diagnosis present

## 2018-07-02 DIAGNOSIS — Z8249 Family history of ischemic heart disease and other diseases of the circulatory system: Secondary | ICD-10-CM | POA: Diagnosis not present

## 2018-07-02 DIAGNOSIS — Z880 Allergy status to penicillin: Secondary | ICD-10-CM | POA: Diagnosis not present

## 2018-07-02 DIAGNOSIS — Z9049 Acquired absence of other specified parts of digestive tract: Secondary | ICD-10-CM | POA: Diagnosis not present

## 2018-07-02 DIAGNOSIS — K565 Intestinal adhesions [bands], unspecified as to partial versus complete obstruction: Secondary | ICD-10-CM | POA: Diagnosis not present

## 2018-07-02 DIAGNOSIS — Z818 Family history of other mental and behavioral disorders: Secondary | ICD-10-CM

## 2018-07-03 ENCOUNTER — Encounter (HOSPITAL_COMMUNITY): Payer: Self-pay | Admitting: Emergency Medicine

## 2018-07-03 ENCOUNTER — Emergency Department (HOSPITAL_COMMUNITY): Payer: Medicare Other

## 2018-07-03 ENCOUNTER — Other Ambulatory Visit: Payer: Self-pay

## 2018-07-03 DIAGNOSIS — D509 Iron deficiency anemia, unspecified: Secondary | ICD-10-CM | POA: Diagnosis not present

## 2018-07-03 DIAGNOSIS — R1084 Generalized abdominal pain: Secondary | ICD-10-CM | POA: Diagnosis present

## 2018-07-03 DIAGNOSIS — C61 Malignant neoplasm of prostate: Secondary | ICD-10-CM

## 2018-07-03 DIAGNOSIS — Z79899 Other long term (current) drug therapy: Secondary | ICD-10-CM | POA: Diagnosis not present

## 2018-07-03 DIAGNOSIS — Z933 Colostomy status: Secondary | ICD-10-CM | POA: Diagnosis not present

## 2018-07-03 DIAGNOSIS — Z825 Family history of asthma and other chronic lower respiratory diseases: Secondary | ICD-10-CM | POA: Diagnosis not present

## 2018-07-03 DIAGNOSIS — Z8546 Personal history of malignant neoplasm of prostate: Secondary | ICD-10-CM | POA: Diagnosis not present

## 2018-07-03 DIAGNOSIS — Z8249 Family history of ischemic heart disease and other diseases of the circulatory system: Secondary | ICD-10-CM | POA: Diagnosis not present

## 2018-07-03 DIAGNOSIS — K56609 Unspecified intestinal obstruction, unspecified as to partial versus complete obstruction: Secondary | ICD-10-CM

## 2018-07-03 DIAGNOSIS — M8580 Other specified disorders of bone density and structure, unspecified site: Secondary | ICD-10-CM | POA: Diagnosis present

## 2018-07-03 DIAGNOSIS — Z818 Family history of other mental and behavioral disorders: Secondary | ICD-10-CM | POA: Diagnosis not present

## 2018-07-03 DIAGNOSIS — Z8042 Family history of malignant neoplasm of prostate: Secondary | ICD-10-CM | POA: Diagnosis not present

## 2018-07-03 DIAGNOSIS — Z800 Family history of malignant neoplasm of digestive organs: Secondary | ICD-10-CM | POA: Diagnosis not present

## 2018-07-03 DIAGNOSIS — K565 Intestinal adhesions [bands], unspecified as to partial versus complete obstruction: Secondary | ICD-10-CM | POA: Diagnosis not present

## 2018-07-03 DIAGNOSIS — K5669 Other partial intestinal obstruction: Secondary | ICD-10-CM | POA: Diagnosis not present

## 2018-07-03 DIAGNOSIS — Z9221 Personal history of antineoplastic chemotherapy: Secondary | ICD-10-CM | POA: Diagnosis not present

## 2018-07-03 DIAGNOSIS — C7951 Secondary malignant neoplasm of bone: Secondary | ICD-10-CM | POA: Diagnosis not present

## 2018-07-03 DIAGNOSIS — K50819 Crohn's disease of both small and large intestine with unspecified complications: Secondary | ICD-10-CM | POA: Diagnosis not present

## 2018-07-03 DIAGNOSIS — Z803 Family history of malignant neoplasm of breast: Secondary | ICD-10-CM | POA: Diagnosis not present

## 2018-07-03 DIAGNOSIS — K432 Incisional hernia without obstruction or gangrene: Secondary | ICD-10-CM | POA: Diagnosis not present

## 2018-07-03 DIAGNOSIS — Z880 Allergy status to penicillin: Secondary | ICD-10-CM | POA: Diagnosis not present

## 2018-07-03 DIAGNOSIS — Z87891 Personal history of nicotine dependence: Secondary | ICD-10-CM | POA: Diagnosis not present

## 2018-07-03 DIAGNOSIS — Z9049 Acquired absence of other specified parts of digestive tract: Secondary | ICD-10-CM | POA: Diagnosis not present

## 2018-07-03 LAB — COMPREHENSIVE METABOLIC PANEL
ALT: 20 U/L (ref 0–44)
ANION GAP: 8 (ref 5–15)
AST: 46 U/L — ABNORMAL HIGH (ref 15–41)
Albumin: 3.3 g/dL — ABNORMAL LOW (ref 3.5–5.0)
Alkaline Phosphatase: 50 U/L (ref 38–126)
BUN: 10 mg/dL (ref 6–20)
CHLORIDE: 107 mmol/L (ref 98–111)
CO2: 25 mmol/L (ref 22–32)
Calcium: 9.1 mg/dL (ref 8.9–10.3)
Creatinine, Ser: 1.24 mg/dL (ref 0.61–1.24)
GFR calc non Af Amer: >60 mL/min (ref >60)
Glucose, Bld: 86 mg/dL (ref 70–99)
POTASSIUM: 3.8 mmol/L (ref 3.5–5.1)
SODIUM: 140 mmol/L (ref 135–145)
Total Bilirubin: 0.9 mg/dL (ref 0.3–1.2)
Total Protein: 7.2 g/dL (ref 6.5–8.1)

## 2018-07-03 LAB — URINALYSIS, ROUTINE W REFLEX MICROSCOPIC
BILIRUBIN URINE: NEGATIVE
Glucose, UA: NEGATIVE mg/dL
Hgb urine dipstick: NEGATIVE
KETONES UR: 5 mg/dL — AB
LEUKOCYTES UA: NEGATIVE
NITRITE: NEGATIVE
Protein, ur: NEGATIVE mg/dL
Specific Gravity, Urine: 1.024 (ref 1.005–1.030)
pH: 5 (ref 5.0–8.0)

## 2018-07-03 LAB — CBC
HEMATOCRIT: 35.2 % — AB (ref 39.0–52.0)
Hemoglobin: 11.2 g/dL — ABNORMAL LOW (ref 13.0–17.0)
MCH: 31.9 pg (ref 26.0–34.0)
MCHC: 31.8 g/dL (ref 30.0–36.0)
MCV: 100.3 fL — ABNORMAL HIGH (ref 78.0–100.0)
Platelets: 341 10*3/uL (ref 150–400)
RBC: 3.51 MIL/uL — ABNORMAL LOW (ref 4.22–5.81)
RDW: 13.1 % (ref 11.5–15.5)
WBC: 8.7 10*3/uL (ref 4.0–10.5)

## 2018-07-03 LAB — TYPE AND SCREEN
ABO/RH(D): A POS
Antibody Screen: NEGATIVE

## 2018-07-03 LAB — APTT: aPTT: 35 seconds (ref 24–36)

## 2018-07-03 LAB — LIPASE, BLOOD: LIPASE: 34 U/L (ref 11–51)

## 2018-07-03 LAB — ABO/RH: ABO/RH(D): A POS

## 2018-07-03 LAB — PROTIME-INR
INR: 1.09
PROTHROMBIN TIME: 14 s (ref 11.4–15.2)

## 2018-07-03 MED ORDER — ONDANSETRON HCL 4 MG/2ML IJ SOLN: 4.0000 mg | Freq: Three times a day (TID) | INTRAMUSCULAR | Status: DC | PRN

## 2018-07-03 MED ORDER — MORPHINE SULFATE (PF) 4 MG/ML IV SOLN
2.0000 mg | INTRAVENOUS | Status: DC | PRN
  Filled 2018-07-03: qty 1

## 2018-07-03 MED ORDER — POLYETHYLENE GLYCOL 3350 17 G PO PACK: 17.0000 g | PACK | Freq: Every day | ORAL | Status: DC | PRN

## 2018-07-03 MED ORDER — ACETAMINOPHEN 325 MG PO TABS: 650.0000 mg | ORAL_TABLET | Freq: Four times a day (QID) | ORAL | Status: DC | PRN

## 2018-07-03 MED ORDER — MORPHINE SULFATE (PF) 4 MG/ML IV SOLN
4.0000 mg | Freq: Once | INTRAVENOUS | Status: AC
  Administered 2018-07-03: 4 mg via INTRAVENOUS
  Filled 2018-07-03: qty 1

## 2018-07-03 MED ORDER — IOHEXOL 300 MG/ML  SOLN
100.0000 mL | Freq: Once | INTRAMUSCULAR | Status: AC | PRN
  Administered 2018-07-03: 100 mL via INTRAVENOUS

## 2018-07-03 MED ORDER — SODIUM CHLORIDE 0.9 % IV BOLUS
1000.0000 mL | Freq: Once | INTRAVENOUS | Status: AC
  Administered 2018-07-03: 1000 mL via INTRAVENOUS

## 2018-07-03 MED ORDER — TRIAMCINOLONE ACETONIDE 0.5 % EX OINT: 1.0000 "application " | TOPICAL_OINTMENT | Freq: Two times a day (BID) | CUTANEOUS | Status: DC | PRN

## 2018-07-03 MED ORDER — HYDROCODONE-ACETAMINOPHEN 5-325 MG PO TABS: 1.0000 | ORAL_TABLET | Freq: Four times a day (QID) | ORAL | Status: DC | PRN

## 2018-07-03 MED ORDER — SODIUM CHLORIDE 0.9 % IV SOLN
INTRAVENOUS | Status: DC
Start: 2018-07-03 — End: 2018-07-05
  Administered 2018-07-03 – 2018-07-05 (×6): via INTRAVENOUS

## 2018-07-03 MED ORDER — ACETAMINOPHEN 650 MG RE SUPP: 650.0000 mg | Freq: Four times a day (QID) | RECTAL | Status: DC | PRN

## 2018-07-03 MED ORDER — ONDANSETRON HCL 4 MG/2ML IJ SOLN
4.0000 mg | Freq: Once | INTRAMUSCULAR | Status: AC
  Administered 2018-07-03: 4 mg via INTRAVENOUS
  Filled 2018-07-03: qty 2

## 2018-07-03 MED ORDER — ZOLPIDEM TARTRATE 5 MG PO TABS: 5.0000 mg | ORAL_TABLET | Freq: Every evening | ORAL | Status: DC | PRN

## 2018-07-03 MED ORDER — LIDOCAINE-PRILOCAINE 2.5-2.5 % EX CREA: 1.0000 "application " | TOPICAL_CREAM | CUTANEOUS | Status: DC | PRN

## 2018-07-03 MED ORDER — BUDESONIDE 3 MG PO CPEP
6.0000 mg | ORAL_CAPSULE | Freq: Every day | ORAL | Status: DC
  Administered 2018-07-03 – 2018-07-06 (×4): 6 mg via ORAL
  Filled 2018-07-03 (×4): qty 2

## 2018-07-03 NOTE — Consult Note (Signed)
General Surgery [facility]  Reason for Consult: small bowel obstruction, incisional hernia  Referring Physician: Dr. Lavella Hammock ER physician  Daniel Howard is an 62 y.o. male.  HPI: patient is a 62 yo BM well known to our surgical service.  Patient with hx of Crohn's with operative intervention in [place] by Dr. Burke Keels.  Now with colostomy and large ventral incisional hernia at site of previous ostomy LLQ abdominal wall.  Hx of recurrent small bowel obstructions which have resolved with bowel rest and conservative management, last in April 2019.  Patient also has metastatic prostate cancer.  General surgery asked to evaluate and follow for SBO.  Past Medical History:  Diagnosis Date  . Abnormal finding of biliary tract    MRCP shows pancreatic/biliary tract dilation. EUS 2010 confirmed dilation but no chronic pancreaitis or mass. Vascular ectasia crimpoing distal CBD.   [name] Anxiety   . 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 Duodenal ulcer 2010   nsaids  . History of blood transfusion 1992   "related to colon OR"  . Peristomal hernia   . Prostate cancer ([place]) 2018  . Small bowel obstruction ([place]) 10/2017; 11/21/2017; 02/16/2018  . Spigelian hernia    bilateral    Past Surgical History:  Procedure Laterality Date  . APPENDECTOMY  1992   at time of exp laparotomy at which time he was noted to have fistulizing Crohn's rather than UC  . COLON SURGERY    . COLONOSCOPY [name] 08/23/2014   TDV:VOHYWVP proctoscopy with possible fistulous opening in thebase of rectal/anal stump.    . COLOSTOMY  1992   transverse loop colostomy  secondary to a stricture  . ESOPHAGOGASTRODUODENOSCOPY  05/2009   SLF: multiple antral erosions, large ulcer at ansatomosis (postsurgical changes at duodenal bulb and second portion of duodenum) BX c/x NSAIDS.  . EUS  10/04/2009   Dr. Estill Bakes dilated CBD and main pancreatic duct.  No pancreatic  . EXPLORATORY LAPAROTOMY  1992  . FLEXIBLE SIGMOIDOSCOPY  1988   Dr. Laural Golden- suggested rohn's disease but the biopsies were not collaborative.  [name] FLEXIBLE SIGMOIDOSCOPY [name] 09/08/2016   Procedure: FLEXIBLE SIGMOIDOSCOPY;  Surgeon: Wonda Horner, MD;  Location: [facility] ENDOSCOPY;  Service: Gastroenterology;  Laterality: [name];  . HEMICOLOECTOMY W/ ANASTOMOSIS  1993   R- Dr.DeMason   . HERNIA REPAIR  1996   incarcerated periostial hernia with additional surgery in 1999  . IR FLUORO GUIDE PORT INSERTION RIGHT  04/03/2017  . IR US GUIDE VASC ACCESS RIGHT  04/03/2017    Family History  Problem Relation Age of Onset  . Cancer Father        prostate   . Prostate cancer Father   . Colon cancer Father 41  . Hypertension Sister   . Cancer Sister   . Depression Sister   . Breast cancer Sister   . COPD Sister   . Aneurysm Brother        deceased, brain aneurysm    Social History:  reports that he quit smoking about 8 years ago. His smoking use included cigarettes. He has a 24.00 pack-year smoking history. He has never used smokeless tobacco. He reports that he  does not drink alcohol or use drugs.  Allergies:  Allergies  Allergen Reactions  . Penicillins Hives    Has patient had a PCN reaction causing immediate rash, facial/tongue/throat swelling, SOB or lightheadedness with hypotension: Yes Has patient had a PCN reaction causing severe rash involving mucus membranes or skin necrosis: No Has patient had a PCN reaction that required hospitalization: No Has patient had a PCN reaction occurring within the last 10 years: No If all of the above answers are "NO", then may proceed with Cephalosporin use.      Medications: I have reviewed the patient's current medications.  Results for orders placed or performed during the hospital encounter of 07/02/18 (from the past 48 hour(s))  Urinalysis, Routine w reflex microscopic     Status: Abnormal   Collection Time: 07/03/18 12:04 AM  Result Value Ref Range   Color, Urine YELLOW YELLOW   APPearance CLEAR CLEAR   Specific Gravity, Urine 1.024 1.005 - 1.030   pH 5.0 5.0 - 8.0   Glucose, UA NEGATIVE NEGATIVE mg/dL   Hgb urine dipstick NEGATIVE NEGATIVE   Bilirubin Urine NEGATIVE NEGATIVE   Ketones, ur 5 (A) NEGATIVE mg/dL   Protein, ur NEGATIVE NEGATIVE mg/dL   Nitrite NEGATIVE NEGATIVE   Leukocytes, UA NEGATIVE NEGATIVE    Comment: Performed at [facility] [address], [place], [place] [address]  Lipase, blood     Status: None   Collection Time: 07/03/18 12:12 AM  Result Value Ref Range   Lipase 34 11 - 51 U/L    Comment: Performed at [facility] [address], [place], [place] [address]  Comprehensive metabolic panel     Status: Abnormal   Collection Time: 07/03/18 12:12 AM  Result Value Ref Range   Sodium 140 135 - 145 mmol/L   Potassium 3.8 3.5 - 5.1 mmol/L   Chloride 107 98 - 111 mmol/L    Comment: Please note change in reference range.   CO2 25 22 - 32 mmol/L   Glucose, Bld 86 70 - 99 mg/dL    Comment: Please note change in reference range.   BUN 10 6 - 20 mg/dL    Comment: Please note change in reference range.   Creatinine, Ser 1.24 0.61 - 1.24 mg/dL   Calcium 9.1 8.9 - 10.3 mg/dL   Total Protein 7.2 6.5 - 8.1 g/dL   Albumin 3.3 (L) 3.5 - 5.0 g/dL   AST 46 (H) 15 - 41 U/L   ALT 20 0 - 44 U/L    Comment: Please note change in reference range.   Alkaline Phosphatase 50 38 - 126 U/L   Total Bilirubin 0.9 0.3 - 1.2 mg/dL   GFR calc non Af Amer >60 >60 mL/min   GFR calc Af Amer >60 >60 mL/min    Comment: (NOTE) The eGFR has been calculated using the CKD EPI equation. This calculation has not been  validated in all clinical situations. eGFR's persistently <60 mL/min signify possible Chronic Kidney Disease.    Anion gap 8 5 - 15    Comment: Performed at [facility] [address] [place], [place] [address]  CBC     Status: Abnormal   Collection Time: 07/03/18 12:12 AM  Result Value Ref Range   WBC 8.7 4.0 - 10.5 K/uL   RBC 3.51 (L) 4.22 - 5.81 MIL/uL   Hemoglobin 11.2 (L) 13.0 - 17.0 g/dL   HCT 35.2 (L) 39.0 - 52.0 %  MCV 100.3 (H) 78.0 - 100.0 fL   MCH 31.9 26.0 - 34.0 pg   MCHC 31.8 30.0 - 36.0 g/dL   RDW 13.1 11.5 - 15.5 %   Platelets 341 150 - 400 K/uL    Comment: Performed at [facility], [place] [address], [place], [place] [address]  Protime-INR     Status: None   Collection Time: 07/03/18  4:36 AM  Result Value Ref Range   Prothrombin Time 14.0 11.4 - 15.2 seconds   INR 1.09     Comment: Performed at [facility] [address], [place], [place] [address]  APTT     Status: None   Collection Time: 07/03/18  4:36 AM  Result Value Ref Range   aPTT 35 24 - 36 seconds    Comment: Performed at [facility] [address] [place], [place] [address]  Type and screen [place]     Status: None   Collection Time: 07/03/18  4:45 AM  Result Value Ref Range   ABO/RH(D) A POS    Antibody Screen NEG    Sample Expiration      07/06/2018 Performed at [facility], [place] [address], [place], [place] [address]   ABO/Rh     Status: None (Preliminary result)   Collection Time: 07/03/18  4:45 AM  Result Value Ref Range   ABO/RH(D)      A POS Performed at [facility] [address] [place], [place] [address]     Ct Abdomen Pelvis W Contrast  Result Date: 07/03/2018 CLINICAL DATA:  62 year old male with right lower quadrant abdominal pain. History of Crohn's disease. EXAM: CT ABDOMEN AND PELVIS WITH CONTRAST TECHNIQUE: Multidetector CT imaging of the abdomen and pelvis was performed using the standard protocol  following bolus administration of intravenous contrast. CONTRAST:  125m OMNIPAQUE IOHEXOL 300 MG/ML  SOLN COMPARISON:  CT of the abdomen pelvis dated 04/17/2018 FINDINGS: Lower chest: Minimal bibasilar dependent atelectasis/scarring. No intra-abdominal free air or free fluid. Hepatobiliary: No focal liver abnormality is seen. No gallstones, gallbladder wall thickening, or biliary dilatation. Pancreas: The pancreas is unremarkable. Mild prominence of the main pancreatic duct similar to prior CT. No gland atrophy. Spleen: Hyperenhancing 1 cm inferior pole lesion, indeterminate, possibly a hemangioma. Adrenals/Urinary Tract: The adrenal glands, kidneys, and visualized ureters appear unremarkable. Multiple small stones noted layering along the posterior bladder wall. Stomach/Bowel: There is postsurgical changes of the bowel with a right lower quadrant ileostomy. There is a left lateral ventral hernia containing multiple loops of small bowel as well as a long segment of the colon. There is narrowing of the bowel loops at the hernia neck with dilatation of the loops within the hernia sac consistent with a degree of obstruction. There is adhesion of loops of small bowel to the anterior pelvic wall adjacent to the right lower quadrant ileostomy. There is mild dilatation of a loop of bowel in the pelvis measuring 4.3 cm in diameter secondary to adhesions and a degree of obstruction. Vascular/Lymphatic: The abdominal aorta and IVC appear unremarkable. No portal venous gas. Top-normal right lower quadrant lymph nodes, likely reactive. Reproductive: Similar appearance of the lobulated soft tissue lesion with areas of calcification in the right pelvic floor. 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 No acute osseous pathology. IMPRESSION: 1. Small-bowel obstruction with transition at the left lateral ventral hernia defect. Probable additional area of small-bowel obstruction in  the  right hemipelvis adjacent to the ileostomy secondary to adhesion. 2. Top-normal right lower quadrant lymph nodes. 3. 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 Electronically Signed   By: Anner Crete M.D.   On: 07/03/2018 03:46    Review of Systems  Constitutional: Negative.   HENT: Negative.   Eyes: Negative.   Respiratory: Negative.   Cardiovascular: Negative.   Gastrointestinal: Positive for abdominal pain and constipation.  Genitourinary: Negative.   Musculoskeletal: Negative.   Skin: Negative.   Neurological: Negative.   Endo/Heme/Allergies: Negative.   Psychiatric/Behavioral: Negative.    Blood pressure 122/82, pulse 67, temperature 98 F (36.7 C), temperature source Oral, resp. rate 16, SpO2 95 %. Physical Exam  Constitutional: He is oriented to person, place, and time. He appears well-developed and well-nourished. No distress.  HENT:  Head: Normocephalic and atraumatic.  Right Ear: External ear normal.  Left Ear: External ear normal.  Mouth/Throat: No oropharyngeal exudate.  Eyes: Pupils are equal, round, and reactive to light. Conjunctivae are normal. No scleral icterus.  Neck: Normal range of motion. Neck supple. No tracheal deviation present. No thyromegaly present.  Cardiovascular: Normal rate, regular rhythm and normal heart sounds.  No murmur heard. Respiratory: Effort normal and breath sounds normal. No respiratory distress. He has no wheezes.  GI: Soft. Bowel sounds are normal. He exhibits no distension. There is tenderness (mild tenderness at hernia LLQ). There is no rebound and no guarding.  Complex abdominal wounds well healed; viable stoma RLQ with minimal output in bag.  Large incisional hernia at previous ostomy site LLQ, not reducible, mildly tender to manipulation.  Musculoskeletal: Normal range of motion. He exhibits no edema, tenderness  or deformity.  Lymphadenopathy:    He has no cervical adenopathy.  Neurological: He is alert and oriented to person, place, and time.  Skin: Skin is warm and dry. He is not diaphoretic.  Psychiatric: He has a normal mood and affect. His behavior is normal.    Assessment/Plan:  Recurrent small bowel obstruction likely secondary to adhesions and incisional hernia, chronic Hx of prostate cancer Hx of Crohn's disease Presence of colostomy   Agree with bowel rest, IV hydration  Encourage OOB, ambulation  AXR ordered for AM 7/14  Will follow - patient has resolved similar episodes in a few days with medical management  Will likely require operative repair of incisional hernia at some point if recurrent obstruction    - ? Return to primary surgeon for care?  [name], [facility] Office: [place] 07/03/2018, 9:34 AM

## 2018-07-03 NOTE — ED Notes (Signed)
Admitting at bedside 

## 2018-07-03 NOTE — ED Notes (Signed)
RN went into room to check on pt.  His demenor has changed, he answers w/ one word responses, has tears running down his face.  RN inquired about the change, his response was "I'm OK."

## 2018-07-03 NOTE — ED Triage Notes (Signed)
Reports RLQ pain that radiates to R flank that started tonight and decreased output in colostomy.

## 2018-07-03 NOTE — ED Notes (Signed)
Admitting MD at bedside.

## 2018-07-03 NOTE — ED Notes (Signed)
Attempted report x1. 

## 2018-07-03 NOTE — H&P (Signed)
History and Physical    Daniel Howard:[id number] DOB: [birth year] DOA: 07/02/2018  Referring MD/NP/PA:   PCP: Alycia Rossetti, MD   Patient coming from:  The patient is coming from home.  At baseline, pt is independent for most of ADL.    Chief Complaint: abdominal pain  HPI: Daniel Howard is a 62 y.o. male with medical history significant of prostate cancer with bony metastases, Crohn's disease on steroids and Humira, multiple abdominal surgeries, s/p of colostomy, small bowel obstruction, duodenal ulcer, iron deficiency anemia, who presents with abdominal pain.  Patient states that he does not have abdominal pai yesterday, which is located in the right lower quadrant around colostomyn area.  The pain is a sharp, initially 8 out of 10 severity, currently chest pain-free, radiating to the right flank area.  No nausea, vomiting or diarrhea.  He has decreased colostomy output.  No fever or chills.  Patient denies chest pain, shortness breath, cough, symptoms of UTI or unilateral weakness. Pt had multiple admission due to small bowel obstruction, which improved with conservative management, most recent admission was in April.  ED Course: pt was found to have WBC 8.7, negative urinalysis, creatinine 1.24, GFR> 60, temperature normal, no tachycardia, no tachypnea, oxygen saturation 96% on room air. CT abdomen/pelvis showed small-bowel obstruction with transition at the left lateral ventral hernia defect. Patient is admitted to [facility] bed as inpatient.  General surgeon, Dr. Georgette Dover was consulted.  Review of Systems:   General: no fevers, chills, no body weight gain, has poor appetite, has fatigue HEENT: no blurry vision, hearing changes or sore throat Respiratory: no dyspnea, coughing, wheezing CV: no chest pain, no palpitations GI: no nausea, vomiting, has abdominal pain, no diarrhea, constipation GU: no dysuria, burning on urination, increased urinary frequency, hematuria  Ext: no leg  edema Neuro: no unilateral weakness, numbness, or tingling, no vision change or hearing loss Skin: no rash, no skin tear. MSK: No muscle spasm, no deformity, no limitation of range of movement in spin Heme: No easy bruising.  Travel history: No recent long distant travel.  Allergy:  Allergies  Allergen Reactions  . Penicillins Hives    Has patient had a PCN reaction causing immediate rash, facial/tongue/throat swelling, SOB or lightheadedness with hypotension: Yes Has patient had a PCN reaction causing severe rash involving mucus membranes or skin necrosis: No Has patient had a PCN reaction that required hospitalization: No Has patient had a PCN reaction occurring within the last 10 years: No If all of the above answers are "NO", then may proceed with Cephalosporin use.     Past Medical History:  Diagnosis Date  . Abnormal finding of biliary tract    MRCP shows pancreatic/biliary tract dilation. EUS 2010 confirmed dilation but no chronic pancreaitis or mass. Vascular ectasia crimpoing distal CBD.   [name] Anxiety   . 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 Duodenal ulcer 2010   nsaids  . History of blood transfusion 1992   "related to colon OR"  . Peristomal hernia   . Prostate cancer ([place]) 2018  . Small bowel obstruction ([place]) 10/2017; 11/21/2017; 02/16/2018  . Spigelian hernia    bilateral    Past Surgical  History:  Procedure Laterality Date  . APPENDECTOMY  1992   at time of exp laparotomy at which time he was noted to have fistulizing Crohn's rather than UC  . COLON SURGERY    . COLONOSCOPY [name] 08/23/2014   [id number] proctoscopy with  possible fistulous opening in thebase of rectal/anal stump.    . COLOSTOMY  1992   transverse loop colostomy secondary to a stricture  . ESOPHAGOGASTRODUODENOSCOPY  05/2009   SLF: multiple antral erosions, large ulcer at ansatomosis (postsurgical changes at duodenal bulb and second portion of duodenum) BX c/x NSAIDS.  . EUS  10/04/2009   Dr. Estill Bakes dilated CBD and main pancreatic duct.  No pancreatic  . EXPLORATORY LAPAROTOMY  1992  . FLEXIBLE SIGMOIDOSCOPY  1988   Dr. Laural Golden- suggested rohn's disease but the biopsies were not collaborative.  [name] FLEXIBLE SIGMOIDOSCOPY [name] 09/08/2016   Procedure: FLEXIBLE SIGMOIDOSCOPY;  Surgeon: Wonda Horner, MD;  Location: [facility] ENDOSCOPY;  Service: Gastroenterology;  Laterality: [name];  . HEMICOLOECTOMY W/ ANASTOMOSIS  1993   R- Dr.DeMason   . HERNIA REPAIR  1996   incarcerated periostial hernia with additional surgery in 1999  . IR FLUORO GUIDE PORT INSERTION RIGHT  04/03/2017  . IR US GUIDE VASC ACCESS RIGHT  04/03/2017    Social History:  reports that he quit smoking about 8 years ago. His smoking use included cigarettes. He has a 24.00 pack-year smoking history. He has never used smokeless tobacco. He reports that he does not drink alcohol or use drugs.  Family History:  Family History  Problem Relation Age of Onset  . Cancer Father        prostate   . Prostate cancer Father   . Colon cancer Father 37  . Hypertension Sister   . Cancer Sister   . Depression Sister   . Breast cancer Sister   . COPD Sister   . Aneurysm Brother        deceased, brain aneurysm     Prior to Admission medications   Medication Sig Start Date End Date Taking? Authorizing Provider  Adalimumab (HUMIRA) 40 MG/0.8ML PSKT Inject 0.8 mLs (40 mg total) into the skin every 7 (seven) days. Patient taking differently: Inject 40 mg into the skin every 14 (fourteen) days.  07/01/18  Yes Carlis Stable, NP  budesonide (ENTOCORT EC) 3 MG 24 hr capsule Take 6 mg by mouth daily.    Yes [provider]  calcium carbonate (OS-CAL) 600 MG TABS tablet Take 1,200 mg by mouth daily with breakfast.   Yes [provider]  Ferrous Sulfate 28 MG TABS Take 1 tablet by mouth daily.   Yes [provider]  HYDROcodone-acetaminophen (NORCO) 5-325 MG tablet Take 1 tablet by mouth every 6 (six) hours as needed for moderate pain. 06/29/18  Yes [place], [name], MD  lidocaine-prilocaine (EMLA) cream Apply 1 application topically as needed (when accessing port).   Yes [provider]  Multiple Vitamins-Minerals (CENTRUM SILVER ADULT 50+) TABS Take 1 tablet by mouth daily.   Yes [provider]  triamcinolone ointment (KENALOG) 0.5 % Apply 1 application topically 2 (two) times daily. Patient taking differently: Apply 1 application topically 2 (two) times daily as needed (rash).  04/03/18  Yes Dannielle Huh, DO    Physical Exam: Vitals:   07/03/18 0130 07/03/18 0145 07/03/18 0215 07/03/18 0230  BP: 117/71 119/77 127/77 121/82  Pulse: 75 79 89 88  Resp: 15 13 (!) 27 13  Temp:  TempSrc:      SpO2: 99% 98% 98% 98%   General: Not in acute distress HEENT:       Eyes: PERRL, EOMI, no scleral icterus.       ENT: No discharge from the ears and nose, no pharynx injection, no tonsillar enlargement.        Neck: No JVD, no bruit, no mass felt. Heme: No neck lymph node enlargement. Cardiac: S1/S2, RRR, No murmurs, No gallops or rubs. Respiratory: No rales, wheezing, rhonchi or rubs. GI: Soft, nondistended, has tenderness in RLQ around the area, no rebound pain, no organomegaly, BS present. GU: No hematuria Ext: No pitting leg edema bilaterally. 2+DP/PT pulse bilaterally. Musculoskeletal: No joint deformities, No joint redness or warmth, no limitation of ROM in spin. Skin: No rashes.  Neuro: Alert, oriented X3, cranial nerves II-XII grossly intact, moves all extremities normally.  Psych: Patient is not psychotic, no suicidal or hemocidal  ideation.  Labs on Admission: I have personally reviewed following labs and imaging studies  CBC: Recent Labs  Lab 06/27/18 0532 07/03/18 0012  WBC 9.5 8.7  NEUTROABS 4.6  --   HGB 11.0* 11.2*  HCT 34.6* 35.2*  MCV 100.0 100.3*  PLT 340 884   Basic Metabolic Panel: Recent Labs  Lab 06/27/18 0532 07/03/18 0012  NA 142 140  K 3.7 3.8  CL 110 107  CO2 21* 25  GLUCOSE 81 86  BUN 14 10  CREATININE 1.13 1.24  CALCIUM 8.7* 9.1   GFR: Estimated Creatinine Clearance: 54.1 mL/min (by C-G formula based on SCr of 1.24 mg/dL). Liver Function Tests: Recent Labs  Lab 06/27/18 0532 07/03/18 0012  AST 28 46*  ALT 15 20  ALKPHOS 45 50  BILITOT 0.8 0.9  PROT 6.7 7.2  ALBUMIN 3.0* 3.3*   Recent Labs  Lab 07/03/18 0012  LIPASE 34   No results for input(s): AMMONIA in the last 168 hours. Coagulation Profile: No results for input(s): INR, PROTIME in the last 168 hours. Cardiac Enzymes: Recent Labs  Lab 06/27/18 0532  TROPONINI <0.03   BNP (last 3 results) No results for input(s): PROBNP in the last 8760 hours. HbA1C: No results for input(s): HGBA1C in the last 72 hours. CBG: No results for input(s): GLUCAP in the last 168 hours. Lipid Profile: No results for input(s): CHOL, HDL, LDLCALC, TRIG, CHOLHDL, LDLDIRECT in the last 72 hours. Thyroid Function Tests: No results for input(s): TSH, T4TOTAL, FREET4, T3FREE, THYROIDAB in the last 72 hours. Anemia Panel: No results for input(s): VITAMINB12, FOLATE, FERRITIN, TIBC, IRON, RETICCTPCT in the last 72 hours. Urine analysis:    Component Value Date/Time   COLORURINE YELLOW 07/03/2018 0004   APPEARANCEUR CLEAR 07/03/2018 0004   LABSPEC 1.024 07/03/2018 0004   PHURINE 5.0 07/03/2018 0004   GLUCOSEU NEGATIVE 07/03/2018 0004   HGBUR NEGATIVE 07/03/2018 0004   BILIRUBINUR NEGATIVE 07/03/2018 0004   KETONESUR 5 (A) 07/03/2018 0004   PROTEINUR NEGATIVE 07/03/2018 0004   UROBILINOGEN 0.2 10/10/2014 1037   NITRITE  NEGATIVE 07/03/2018 0004   LEUKOCYTESUR NEGATIVE 07/03/2018 0004   Sepsis Labs: @LABRCNTIP (procalcitonin:4,lacticidven:4) )No results found for this or any previous visit (from the past 240 hour(s)).   Radiological Exams on Admission: Ct Abdomen Pelvis W Contrast  Result Date: 07/03/2018 CLINICAL DATA:  62 year old male with right lower quadrant abdominal pain. History of Crohn's disease. EXAM: CT ABDOMEN AND PELVIS WITH CONTRAST TECHNIQUE: Multidetector CT imaging of the abdomen and pelvis was performed using the standard protocol following bolus administration of  intravenous contrast. CONTRAST:  12m OMNIPAQUE IOHEXOL 300 MG/ML  SOLN COMPARISON:  CT of the abdomen pelvis dated 04/17/2018 FINDINGS: Lower chest: Minimal bibasilar dependent atelectasis/scarring. No intra-abdominal free air or free fluid. Hepatobiliary: No focal liver abnormality is seen. No gallstones, gallbladder wall thickening, or biliary dilatation. Pancreas: The pancreas is unremarkable. Mild prominence of the main pancreatic duct similar to prior CT. No gland atrophy. Spleen: Hyperenhancing 1 cm inferior pole lesion, indeterminate, possibly a hemangioma. Adrenals/Urinary Tract: The adrenal glands, kidneys, and visualized ureters appear unremarkable. Multiple small stones noted layering along the posterior bladder wall. Stomach/Bowel: There is postsurgical changes of the bowel with a right lower quadrant ileostomy. There is a left lateral ventral hernia containing multiple loops of small bowel as well as a long segment of the colon. There is narrowing of the bowel loops at the hernia neck with dilatation of the loops within the hernia sac consistent with a degree of obstruction. There is adhesion of loops of small bowel to the anterior pelvic wall adjacent to the right lower quadrant ileostomy. There is mild dilatation of a loop of bowel in the pelvis measuring 4.3 cm in diameter secondary to adhesions and a degree of obstruction.  Vascular/Lymphatic: The abdominal aorta and IVC appear unremarkable. No portal venous gas. Top-normal right lower quadrant lymph nodes, likely reactive. Reproductive: Similar appearance of the lobulated soft tissue lesion with areas of calcification in the right pelvic floor. 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 No acute osseous pathology. IMPRESSION: 1. Small-bowel obstruction with transition at the left lateral ventral hernia defect. Probable additional area of small-bowel obstruction in the right hemipelvis adjacent to the ileostomy secondary to adhesion. 2. Top-normal right lower quadrant lymph nodes. 3. 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 Electronically Signed   By: AAnner CreteM.D.   On: 07/03/2018 03:46     EKG:  Not done in ED, will get one.   Assessment/Plan Principal Problem:   SBO (small bowel obstruction) (HCC) Active Problems:   Crohn's disease of both small and large intestine with complication (HCC)   Prostate cancer ([place]   Iron deficiency anemia   SBO (small bowel obstruction) ([place]: pt does not have fever or leukocytosis, clinically not septic.  Hemodynamically stable.  General surgeon, Dr. TGeorgette Doverwas consulted. Per EDP, Dr. TGeorgette Doverwould like to get GI consult. Pt states that his pain has resolved now.  No nausea, vomiting.  -Admit to med surg bed as inpt -NPO   -prn NG tube (not started yet) -morphine prn pain -Prn Zofran prn nausea   -IVF: 1L NS, then 125 cc/h -INR/PTT/type & screen -hold oral med except for Norco and Entocort -Follow-up general surgeons recommendation -please call GI in AM  Crohn's disease of both small and large intestine with complication ([facility]: [facility] He is taking Entocort 6 mg daily and Humira injection every other week.  Last  dose was on last Wednesday. Pt states that GI is planing to increase humira injection frequency to weekly injection. -Continue Entocort -Please consult GI in the morning  Prostate cancer ([facility]: s/p of chemotherapy. Pt is [facility] up with [facility], and get androgen deprivation treatment, last dose of injection was last month.  Patient is also following up with oncology, Dr. SAlen Blew -f/u with urology and oncology  Iron  deficiency anemia: Hgb stable. 11.2. -hold iron supplement until bowel obstruction resolves  DVT ppx: SCD Code Status: Full code Family Communication: Yes, patient's wife   at bed side Disposition Plan:  Anticipate discharge back to previous home environment Consults called:  Dr. Georgette Dover Admission status:  medical floor/inpt  Date of Service 07/03/2018    Ivor Costa [facility] Pager [phone]  If 7PM-7AM, please contact night-coverage [email or web address] Password [facility] 07/03/2018, 4:52 AM

## 2018-07-03 NOTE — ED Provider Notes (Signed)
[facility] EMERGENCY DEPARTMENT Provider Note   CSN: [id number] Arrival date & time: 07/02/18  2336     History   Chief Complaint Chief Complaint  Patient presents with  . Abdominal Pain    HPI Daniel Howard is a 62 y.o. male.  Patient is a 62 year old male with past medical history of Crohn's disease with multiple surgeries and colostomy.  He also has history of recurrent bowel obstructions.  He presents today for evaluation of generalized abdominal pain and decreased stool in the ostomy bag.  This started earlier this afternoon and is worsening.  He denies any vomiting, fevers, or chills.  He denies any bloody ostomy outs.  He denies any urinary complaints.  He states this feels similar to when he had a prior bowel obstruction.  The history is provided by the patient.  Abdominal Pain   This is a recurrent problem. Episode onset: This afternoon. The problem occurs constantly. The problem has been gradually worsening. The pain is associated with an unknown factor. The pain is located in the generalized abdominal region. The quality of the pain is cramping. The pain is moderate. Pertinent negatives include fever, hematochezia, melena, dysuria and frequency. Nothing aggravates the symptoms. Nothing relieves the symptoms.    Past Medical History:  Diagnosis Date  . Abnormal finding of biliary tract    MRCP shows pancreatic/biliary tract dilation. EUS 2010 confirmed dilation but no chronic pancreaitis or mass. Vascular ectasia crimpoing distal CBD.   [name] Anxiety   . 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 Duodenal ulcer 2010   nsaids  . History of blood transfusion 1992   "related to colon OR"  . Peristomal hernia   . Prostate cancer ([place]) 2018  . Small bowel obstruction ([place]) 10/2017; 11/21/2017; 02/16/2018  . Spigelian hernia    bilateral    Patient Active Problem List   Diagnosis Date Noted  . Nausea without vomiting 11/10/2017  . Spigelian hernia 09/22/2017  . Abdominal pain of multiple sites 09/22/2017  . Goals of care, counseling/discussion 04/02/2017  . Prostate cancer ([place]) 04/02/2017  . Elevated PSA 03/04/2017  . Osteopenia 11/03/2016  . Pelvic mass in male 09/04/2016  . 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 Exacerbation of Crohn's disease ([place]) 09/04/2016  . Protein-calorie malnutrition, severe ([place]) 07/27/2014  . SBO (small bowel obstruction) ([place]) 07/26/2014  . Loss of weight 06/09/2014  . Crohn's disease of both small and large intestine with complication ([place]) 96/29/5284  . Boils 02/11/2013  . Ventral hernia 12/11/2009  . Anemia 12/03/2009  . Regional enteritis/Crohn's 01/25/2007    Past Surgical History:  Procedure Laterality Date  . APPENDECTOMY  1992   at time of exp laparotomy at which time he was noted to have fistulizing Crohn's rather than UC  . COLON SURGERY    . COLONOSCOPY [name] 08/23/2014   [id number] proctoscopy with possible fistulous opening in thebase of rectal/anal stump.    . COLOSTOMY  1992   transverse loop colostomy secondary to a stricture  . ESOPHAGOGASTRODUODENOSCOPY  05/2009   SLF: multiple antral erosions, large ulcer at ansatomosis (postsurgical changes at duodenal bulb and second portion of duodenum) BX c/x NSAIDS.  . EUS  10/04/2009  Dr. Estill Bakes dilated CBD and main pancreatic duct.  No pancreatic  . EXPLORATORY LAPAROTOMY  1992  . FLEXIBLE SIGMOIDOSCOPY  1988   Dr. Laural Golden- suggested rohn's disease but the biopsies were not collaborative.  [name] FLEXIBLE SIGMOIDOSCOPY [name] 09/08/2016   Procedure: FLEXIBLE SIGMOIDOSCOPY;  Surgeon: Wonda Horner, MD;  Location:  [facility] ENDOSCOPY;  Service: Gastroenterology;  Laterality: [name];  . HEMICOLOECTOMY W/ ANASTOMOSIS  1993   R- Dr.DeMason   . HERNIA REPAIR  1996   incarcerated periostial hernia with additional surgery in 1999  . IR FLUORO GUIDE PORT INSERTION RIGHT  04/03/2017  . IR US GUIDE VASC ACCESS RIGHT  04/03/2017        Home Medications    Prior to Admission medications   Medication Sig Start Date End Date Taking? Authorizing Provider  Adalimumab (HUMIRA) 40 MG/0.8ML PSKT Inject 0.8 mLs (40 mg total) into the skin every 7 (seven) days. 07/01/18   Carlis Stable, NP  budesonide (ENTOCORT EC) 3 MG 24 hr capsule Take 6 mg by mouth daily.    [provider]  calcium carbonate (OS-CAL) 600 MG TABS tablet Take 1,200 mg by mouth daily with breakfast.    [provider]  Ferrous Sulfate 28 MG TABS Take 1 tablet by mouth daily.    [provider]  HYDROcodone-acetaminophen (NORCO) 5-325 MG tablet Take 1 tablet by mouth every 6 (six) hours as needed for moderate pain. 06/29/18   Alycia Rossetti, MD  lidocaine-prilocaine (EMLA) cream Apply 1 application topically as needed (when accessing port).    [provider]  Multiple Vitamins-Minerals (CENTRUM SILVER ADULT 50+) TABS Take 1 tablet by mouth daily.    [provider]  triamcinolone ointment (KENALOG) 0.5 % Apply 1 application topically 2 (two) times daily. Patient taking differently: Apply 1 application topically 2 (two) times daily as needed (rash).  04/03/18   Dannielle Huh, DO    Family History Family History  Problem Relation Age of Onset  . Cancer Father        prostate   . Prostate cancer Father   . Colon cancer Father 71  . Hypertension Sister   . Cancer Sister   . Depression Sister   . Breast cancer Sister   . COPD Sister   . Aneurysm Brother        deceased, brain aneurysm    Social History Social History   Tobacco Use  . Smoking status: Former Smoker    Packs/day: 0.75    Years: 32.00     Pack years: 24.00    Types: Cigarettes    Last attempt to quit: 12/21/2009    Years since quitting: 8.5  . Smokeless tobacco: Never Used  Substance Use Topics  . Alcohol use: No    Alcohol/week: 0.0 oz    Comment: Former drinker  . Drug use: No     Allergies   Penicillins   Review of Systems Review of Systems  Constitutional: Negative for fever.  Gastrointestinal: Positive for abdominal pain. Negative for hematochezia and melena.  Genitourinary: Negative for dysuria and frequency.  All other systems reviewed and are negative.    Physical Exam Updated Vital Signs BP (!) 131/94 (BP Location: Right Arm)   Pulse 84   Temp 98.7 F (37.1 C) (Oral)   Resp 18   SpO2 96%   Physical Exam  Constitutional: He is oriented to person, place, and time. He appears well-developed and well-nourished. No distress.  HENT:  Head: Normocephalic and  atraumatic.  Mouth/Throat: Oropharynx is clear and moist.  Neck: Normal range of motion. Neck supple.  Cardiovascular: Normal rate and regular rhythm. Exam reveals no friction rub.  No murmur heard. Pulmonary/Chest: Effort normal and breath sounds normal. No respiratory distress. He has no wheezes. He has no rales.  Abdominal: Soft. Bowel sounds are normal. He exhibits no distension. There is generalized tenderness. There is no rigidity, no rebound and no guarding.  There is generalized abdominal tenderness with no peritoneal signs.  He has multiple scars present on the abdomen.  The colostomy site appears clean and intact.  There is minimal, liquid, brown drainage in the bag.  Musculoskeletal: Normal range of motion. He exhibits no edema.  Neurological: He is alert and oriented to person, place, and time. Coordination normal.  Skin: Skin is warm and dry. He is not diaphoretic.  Nursing note and vitals reviewed.    ED Treatments / Results  Labs (all labs ordered are listed, but only abnormal results are displayed) Labs Reviewed    URINALYSIS, ROUTINE W REFLEX MICROSCOPIC - Abnormal; Notable for the following components:      Result Value   Ketones, ur 5 (*)    All other components within normal limits  LIPASE, BLOOD  COMPREHENSIVE METABOLIC PANEL  CBC    EKG None  Radiology No results found.  Procedures Procedures (including critical care time)  Medications Ordered in ED Medications - No data to display   Initial Impression / Assessment and Plan / ED Course  I have reviewed the triage vital signs and the nursing notes.  Pertinent labs & imaging results that were available during my care of the patient were reviewed by me and considered in my medical decision making (see chart for details).  CT scan shows a small bowel obstruction.  This finding was discussed with Dr. Georgette Dover who would like the patient admitted to the hospitalist service for GI consultation to assess the status of his Crohn's disease and whether or not this could be related to a flareup.  Final Clinical Impressions(s) / ED Diagnoses   Final diagnoses:  None    ED Discharge Orders    None       [name], MD 07/03/18 2323

## 2018-07-03 NOTE — Progress Notes (Signed)
Patient seen and examined, admitted earlier this morning Dr.Niu -this is a 62 year old male with history of Crohn's disease, multiple abdominal surgeries, followed by gastroenterology in [place], now with ileostomy, large ventral hernia, readmitted early this morning with recurrent SBO. -Symptoms appear to be improving now, no nausea and vomiting, continue nothing by mouth IV fluids, antiemetics and supportive care -General surgery consulted, no symptoms of active Crohn's flare -Ambulate -Follow-up KUB in a.m., -Hopefully will improve with conservative management again  [name], MD

## 2018-07-04 ENCOUNTER — Inpatient Hospital Stay (HOSPITAL_COMMUNITY): Payer: Medicare Other

## 2018-07-04 DIAGNOSIS — K50819 Crohn's disease of both small and large intestine with unspecified complications: Secondary | ICD-10-CM

## 2018-07-04 DIAGNOSIS — K432 Incisional hernia without obstruction or gangrene: Secondary | ICD-10-CM

## 2018-07-04 LAB — BASIC METABOLIC PANEL
Anion gap: 11 (ref 5–15)
BUN: 7 mg/dL (ref 6–20)
CALCIUM: 7.9 mg/dL — AB (ref 8.9–10.3)
CHLORIDE: 109 mmol/L (ref 98–111)
CO2: 19 mmol/L — ABNORMAL LOW (ref 22–32)
Creatinine, Ser: 1.15 mg/dL (ref 0.61–1.24)
Glucose, Bld: 48 mg/dL — ABNORMAL LOW (ref 70–99)
Potassium: 3.8 mmol/L (ref 3.5–5.1)
Sodium: 139 mmol/L (ref 135–145)

## 2018-07-04 LAB — CBC
HCT: 35.1 % — ABNORMAL LOW (ref 39.0–52.0)
Hemoglobin: 11.5 g/dL — ABNORMAL LOW (ref 13.0–17.0)
MCH: 32.5 pg (ref 26.0–34.0)
MCHC: 32.8 g/dL (ref 30.0–36.0)
MCV: 99.2 fL (ref 78.0–100.0)
PLATELETS: 322 10*3/uL (ref 150–400)
RBC: 3.54 MIL/uL — ABNORMAL LOW (ref 4.22–5.81)
RDW: 12.7 % (ref 11.5–15.5)
WBC: 12.4 10*3/uL — ABNORMAL HIGH (ref 4.0–10.5)

## 2018-07-04 NOTE — Progress Notes (Signed)
Assessment & Plan:  Recurrent small bowel obstruction likely secondary to adhesions and incisional hernia, chronic  Increasing output from ostomy this AM - will try clear liquid diet  Ambulate in halls  AXR with few air/fluid levels - will monitor  Hx of prostate cancer Hx of Crohn's disease Presence of ostomy        Earnstine Regal, MD, [facility]       Office: [phone]   Chief Complaint: Small bowel obstruction, incisional hernia  Subjective: Patient anxiously awaiting me this AM, wants clear liquid diet.  Increased output from ostomy.  Denies pain, nausea, emesis.  Objective: Vital signs in last 24 hours: Temp:  [98.4 F (36.9 C)-98.8 F (37.1 C)] 98.4 F (36.9 C) (07/14 0445) Pulse Rate:  [61-78] 78 (07/14 0445) Resp:  [14-16] 16 (07/14 0445) BP: (114-121)/(69-76) 114/69 (07/14 0445) SpO2:  [98 %-100 %] 98 % (07/14 0445) Last BM Date: 07/04/18  Intake/Output from previous day: 07/13 0701 - 07/14 0700 In: 2155 [I.V.:2155] Out: 1650 [Urine:1650] Intake/Output this shift: No intake/output data recorded.  Physical Exam: HEENT - sclerae clear, mucous membranes moist Neck - soft Chest - clear bilaterally Cor - RRR Abdomen - softer, hernia less distended, softer; BS present; small output in bag; stoma viabl Ext - no edema, non-tender Neuro - alert & oriented, no focal deficits  Lab Results:  Recent Labs    07/03/18 0012 07/04/18 0426  WBC 8.7 12.4*  HGB 11.2* 11.5*  HCT 35.2* 35.1*  PLT 341 322   BMET Recent Labs    07/03/18 0012 07/04/18 0426  NA 140 139  K 3.8 3.8  CL 107 109  CO2 25 19*  GLUCOSE 86 48*  BUN 10 7  CREATININE 1.24 1.15  CALCIUM 9.1 7.9*   PT/INR Recent Labs    07/03/18 0436  LABPROT 14.0  INR 1.09   Comprehensive Metabolic Panel:    Component Value Date/Time   NA 139 07/04/2018 0426   NA 140 07/03/2018 0012   NA 137 11/20/2017 0931   NA 136 09/21/2017 0921   K 3.8  07/04/2018 0426   K 3.8 07/03/2018 0012   K 3.3 (L) 11/20/2017 0931   K 4.1 09/21/2017 0921   CL 109 07/04/2018 0426   CL 107 07/03/2018 0012   CO2 19 (L) 07/04/2018 0426   CO2 25 07/03/2018 0012   CO2 27 11/20/2017 0931   CO2 26 09/21/2017 0921   BUN 7 07/04/2018 0426   BUN 10 07/03/2018 0012   BUN 16.6 11/20/2017 0931   BUN 15.1 09/21/2017 0921   CREATININE 1.15 07/04/2018 0426   CREATININE 1.24 07/03/2018 0012   CREATININE 1.10 04/14/2018 1010   CREATININE 1.04 01/13/2018 1145   CREATININE 0.99 12/02/2017 1249   CREATININE 1.2 11/20/2017 0931   CREATININE 1.3 09/21/2017 0921   GLUCOSE 48 (L) 07/04/2018 0426   GLUCOSE 86 07/03/2018 0012   GLUCOSE 99 11/20/2017 0931   GLUCOSE 86 09/21/2017 0921   CALCIUM 7.9 (L) 07/04/2018 0426   CALCIUM 9.1 07/03/2018 0012   CALCIUM 8.9 11/20/2017 0931   CALCIUM 9.7 09/21/2017 0921   AST 46 (H) 07/03/2018 0012   AST 28 06/27/2018 0532   AST 16 04/14/2018 1010   AST 17 01/13/2018 1145   AST 38 (H) 11/20/2017 0931   AST 20 09/21/2017 0921   ALT 20 07/03/2018 0012   ALT 15 06/27/2018 0532   ALT 11  04/14/2018 1010   ALT 17 01/13/2018 1145   ALT 45 11/20/2017 0931   ALT 9 09/21/2017 0921   ALKPHOS 50 07/03/2018 0012   ALKPHOS 45 06/27/2018 0532   ALKPHOS 62 11/20/2017 0931   ALKPHOS 81 09/21/2017 0921   BILITOT 0.9 07/03/2018 0012   BILITOT 0.8 06/27/2018 0532   BILITOT 0.4 04/14/2018 1010   BILITOT 0.4 01/13/2018 1145   BILITOT 0.66 11/20/2017 0931   BILITOT 0.59 09/21/2017 0921   PROT 7.2 07/03/2018 0012   PROT 6.7 06/27/2018 0532   PROT 7.5 11/20/2017 0931   PROT 7.9 09/21/2017 0921   ALBUMIN 3.3 (L) 07/03/2018 0012   ALBUMIN 3.0 (L) 06/27/2018 0532   ALBUMIN 3.1 (L) 11/20/2017 0931   ALBUMIN 3.3 (L) 09/21/2017 0921    Studies/Results: Ct Abdomen Pelvis W Contrast  Result Date: 07/03/2018 CLINICAL DATA:  62 year old male with right lower quadrant abdominal pain. History of Crohn's disease. EXAM: CT ABDOMEN AND PELVIS  WITH CONTRAST TECHNIQUE: Multidetector CT imaging of the abdomen and pelvis was performed using the standard protocol following bolus administration of intravenous contrast. CONTRAST:  148m OMNIPAQUE IOHEXOL 300 MG/ML  SOLN COMPARISON:  CT of the abdomen pelvis dated 04/17/2018 FINDINGS: Lower chest: Minimal bibasilar dependent atelectasis/scarring. No intra-abdominal free air or free fluid. Hepatobiliary: No focal liver abnormality is seen. No gallstones, gallbladder wall thickening, or biliary dilatation. Pancreas: The pancreas is unremarkable. Mild prominence of the main pancreatic duct similar to prior CT. No gland atrophy. Spleen: Hyperenhancing 1 cm inferior pole lesion, indeterminate, possibly a hemangioma. Adrenals/Urinary Tract: The adrenal glands, kidneys, and visualized ureters appear unremarkable. Multiple small stones noted layering along the posterior bladder wall. Stomach/Bowel: There is postsurgical changes of the bowel with a right lower quadrant ileostomy. There is a left lateral ventral hernia containing multiple loops of small bowel as well as a long segment of the colon. There is narrowing of the bowel loops at the hernia neck with dilatation of the loops within the hernia sac consistent with a degree of obstruction. There is adhesion of loops of small bowel to the anterior pelvic wall adjacent to the right lower quadrant ileostomy. There is mild dilatation of a loop of bowel in the pelvis measuring 4.3 cm in diameter secondary to adhesions and a degree of obstruction. Vascular/Lymphatic: The abdominal aorta and IVC appear unremarkable. No portal venous gas. Top-normal right lower quadrant lymph nodes, likely reactive. Reproductive: Similar appearance of the lobulated soft tissue lesion with areas of calcification in the right pelvic floor. 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 No acute osseous pathology. IMPRESSION: 1. Small-bowel obstruction  with transition at the left lateral ventral hernia defect. Probable additional area of small-bowel obstruction in the right hemipelvis adjacent to the ileostomy secondary to adhesion. 2. Top-normal right lower quadrant lymph nodes. 3. 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 Electronically Signed   By: AAnner CreteM.D.   On: 07/03/2018 03:46   Dg Abd 2 Views  Result Date: 07/04/2018 CLINICAL DATA:  Small-bowel obstruction EXAM: ABDOMEN - 2 VIEW COMPARISON:  CT abdomen pelvis 07/03/2018 FINDINGS: Large abdominal hernia left containing multiple bowel loops. Mildly dilated small bowel loops especially in the right abdomen and mid abdomen suggesting partial small bowel obstruction. No free air on the upright view. Air-fluid levels in small bowel on the upright view. IMPRESSION: Mildly  dilated small bowel loops with air-fluid levels suggestive of small-bowel obstruction. Large ventral hernia on the left containing bowel loops. Electronically Signed   By: Franchot Gallo M.D.   On: 07/04/2018 10:06      Blaine Guiffre M 07/04/2018  Patient ID: Daniel Howard, male   DOB: November 05, 1956, 62 y.o.   MRN: [id number]

## 2018-07-04 NOTE — Progress Notes (Signed)
PROGRESS NOTE    Daniel Howard  JHE:[id number] DOB: 20-Jul-1956 DOA: 07/02/2018 PCP: Alycia Rossetti, MD  Brief Narrative: 62 year old male with history of Crohn's disease, multiple abdominal surgeries, followed by gastroenterology in [place], now with ileostomy, large ventral hernia, readmitted early 7/12 morning with recurrent SBO  Assessment & Plan:   RecurrentSBO (small bowel obstruction) ([place]): -due to multiple abdominal surgeries - adhesions and chronic incisional hernia -Improving with supportive care -Appreciated CCS input -Clears today, continue IV fluids and supportive care, ambulation -Needs surgery down the road for this  Crohn's disease of both small and large intestine with complication [facility]): pt was recently seen by GI in [facility] 07/01/18. He is taking Entocort 6 mg daily and Humira injection every other week. -resume Entocort tomorrow  Prostate cancer [facility]): s/p of chemotherapy -[facility], on [facility] deprivation treatment, last dose of injection was last month, and oncology, Dr. Alen Blew.  Iron deficiency anemia: Hgb stable. 11.2. -hold iron supplement until bowel obstruction resolves  DVT ppx: SCD Code Status: Full code Family Communication: wife   at bed side Disposition Plan:  home in 1-2 days  Consultants:   CCS   Procedures:   Antimicrobials:    Subjective: -feels better, had multiple liquid stools and has ileostomy  Objective: Vitals:   07/03/18 0534 07/03/18 1559 07/03/18 2040 07/04/18 0445  BP: 122/82 114/76 121/74 114/69  Pulse: 67 61 77 78  Resp: 16  14 16   Temp: 98 F (36.7 C) 98.8 F (37.1 C) 98.7 F (37.1 C) 98.4 F (36.9 C)  TempSrc: Oral Oral Oral Oral  SpO2: 95% 100% 98% 98%    Intake/Output Summary (Last 24 hours) at 07/04/2018 1119 Last data filed at 07/04/2018 0700 Gross per 24 hour  Intake 2155 ml  Output 1500 ml  Net 655 ml   There were no vitals filed for this  visit.  Examination:  General exam: Appears calm and comfortable  Respiratory system: Clear to auscultation.  Cardiovascular system: S1 & S2 heard, RRR Gastrointestinal system: Abdomen is nondistended, soft and nontender, multiple large scars noted, incisional hernia Central nervous system: Alert and oriented. No focal neurological deficits. Extremities: Symmetric 5 x 5 power. Skin: No rashes, lesions or ulcers Psychiatry: Judgement and insight appear normal. Mood & affect appropriate.     Data Reviewed:   CBC: Recent Labs  Lab 07/03/18 0012 07/04/18 0426  WBC 8.7 12.4*  HGB 11.2* 11.5*  HCT 35.2* 35.1*  MCV 100.3* 99.2  PLT 341 185   Basic Metabolic Panel: Recent Labs  Lab 07/03/18 0012 07/04/18 0426  NA 140 139  K 3.8 3.8  CL 107 109  CO2 25 19*  GLUCOSE 86 48*  BUN 10 7  CREATININE 1.24 1.15  CALCIUM 9.1 7.9*   GFR: Estimated Creatinine Clearance: 58.4 mL/min (by C-G formula based on SCr of 1.15 mg/dL). Liver Function Tests: Recent Labs  Lab 07/03/18 0012  AST 46*  ALT 20  ALKPHOS 50  BILITOT 0.9  PROT 7.2  ALBUMIN 3.3*   Recent Labs  Lab 07/03/18 0012  LIPASE 34   No results for input(s): AMMONIA in the last 168 hours. Coagulation Profile: Recent Labs  Lab 07/03/18 0436  INR 1.09   Cardiac Enzymes: No results for input(s): CKTOTAL, CKMB, CKMBINDEX, TROPONINI in the last 168 hours. BNP (last 3 results) No results for input(s): PROBNP in the last 8760 hours. HbA1C: No results for input(s): HGBA1C in the last 72 hours. CBG: No results for input(s):  GLUCAP in the last 168 hours. Lipid Profile: No results for input(s): CHOL, HDL, LDLCALC, TRIG, CHOLHDL, LDLDIRECT in the last 72 hours. Thyroid Function Tests: No results for input(s): TSH, T4TOTAL, FREET4, T3FREE, THYROIDAB in the last 72 hours. Anemia Panel: No results for input(s): VITAMINB12, FOLATE, FERRITIN, TIBC, IRON, RETICCTPCT in the last 72 hours. Urine analysis:    Component  Value Date/Time   COLORURINE YELLOW 07/03/2018 0004   APPEARANCEUR CLEAR 07/03/2018 0004   LABSPEC 1.024 07/03/2018 0004   PHURINE 5.0 07/03/2018 0004   GLUCOSEU NEGATIVE 07/03/2018 0004   HGBUR NEGATIVE 07/03/2018 0004   BILIRUBINUR NEGATIVE 07/03/2018 0004   KETONESUR 5 (A) 07/03/2018 0004   PROTEINUR NEGATIVE 07/03/2018 0004   UROBILINOGEN 0.2 10/10/2014 1037   NITRITE NEGATIVE 07/03/2018 0004   LEUKOCYTESUR NEGATIVE 07/03/2018 0004   Sepsis Labs: @LABRCNTIP (procalcitonin:4,lacticidven:4)  )No results found for this or any previous visit (from the past 240 hour(s)).       Radiology Studies: Ct Abdomen Pelvis W Contrast  Result Date: 07/03/2018 CLINICAL DATA:  62 year old male with right lower quadrant abdominal pain. History of Crohn's disease. EXAM: CT ABDOMEN AND PELVIS WITH CONTRAST TECHNIQUE: Multidetector CT imaging of the abdomen and pelvis was performed using the standard protocol following bolus administration of intravenous contrast. CONTRAST:  13m OMNIPAQUE IOHEXOL 300 MG/ML  SOLN COMPARISON:  CT of the abdomen pelvis dated 04/17/2018 FINDINGS: Lower chest: Minimal bibasilar dependent atelectasis/scarring. No intra-abdominal free air or free fluid. Hepatobiliary: No focal liver abnormality is seen. No gallstones, gallbladder wall thickening, or biliary dilatation. Pancreas: The pancreas is unremarkable. Mild prominence of the main pancreatic duct similar to prior CT. No gland atrophy. Spleen: Hyperenhancing 1 cm inferior pole lesion, indeterminate, possibly a hemangioma. Adrenals/Urinary Tract: The adrenal glands, kidneys, and visualized ureters appear unremarkable. Multiple small stones noted layering along the posterior bladder wall. Stomach/Bowel: There is postsurgical changes of the bowel with a right lower quadrant ileostomy. There is a left lateral ventral hernia containing multiple loops of small bowel as well as a long segment of the colon. There is narrowing of the  bowel loops at the hernia neck with dilatation of the loops within the hernia sac consistent with a degree of obstruction. There is adhesion of loops of small bowel to the anterior pelvic wall adjacent to the right lower quadrant ileostomy. There is mild dilatation of a loop of bowel in the pelvis measuring 4.3 cm in diameter secondary to adhesions and a degree of obstruction. Vascular/Lymphatic: The abdominal aorta and IVC appear unremarkable. No portal venous gas. Top-normal right lower quadrant lymph nodes, likely reactive. Reproductive: Similar appearance of the lobulated soft tissue lesion with areas of calcification in the right pelvic floor. 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 No acute osseous pathology. IMPRESSION: 1. Small-bowel obstruction with transition at the left lateral ventral hernia defect. Probable additional area of small-bowel obstruction in the right hemipelvis adjacent to the ileostomy secondary to adhesion. 2. Top-normal right lower quadrant lymph nodes. 3. 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 Electronically Signed   By: AAnner CreteM.D.   On: 07/03/2018 03:46   Dg Abd 2 Views  Result Date: 07/04/2018 CLINICAL DATA:  Small-bowel obstruction EXAM: ABDOMEN - 2 VIEW COMPARISON:  CT abdomen pelvis 07/03/2018 FINDINGS: Large abdominal hernia left containing multiple bowel loops. Mildly dilated small bowel loops especially  in the right abdomen and mid abdomen suggesting partial small bowel obstruction. No free air on the upright view. Air-fluid levels in small bowel on the upright view. IMPRESSION: Mildly dilated small bowel loops with air-fluid levels suggestive of small-bowel obstruction. Large ventral hernia on the left containing bowel loops. Electronically Signed   By: Franchot Gallo M.D.   On: 07/04/2018 10:06         Scheduled Meds: . budesonide  6 mg Oral Daily   Continuous Infusions: . sodium chloride 75 mL/hr at 07/04/18 0700     LOS: 1 day    Time spent: 74mn    PDomenic Polite MD [facility] Page via [email or web address], password [email or web address] After 7PM please contact night-coverage  07/04/2018, 11:19 AM

## 2018-07-05 ENCOUNTER — Telehealth: Payer: Self-pay

## 2018-07-05 ENCOUNTER — Other Ambulatory Visit: Payer: Self-pay

## 2018-07-05 LAB — BASIC METABOLIC PANEL
Anion gap: 6 (ref 5–15)
CHLORIDE: 112 mmol/L — AB (ref 98–111)
CO2: 24 mmol/L (ref 22–32)
Calcium: 7.8 mg/dL — ABNORMAL LOW (ref 8.9–10.3)
Creatinine, Ser: 1.05 mg/dL (ref 0.61–1.24)
GFR calc Af Amer: >60 mL/min (ref >60)
GFR calc non Af Amer: >60 mL/min (ref >60)
GLUCOSE: 98 mg/dL (ref 70–99)
POTASSIUM: 4.5 mmol/L (ref 3.5–5.1)
Sodium: 142 mmol/L (ref 135–145)

## 2018-07-05 LAB — CBC
HEMATOCRIT: 35.5 % — AB (ref 39.0–52.0)
HEMOGLOBIN: 11.5 g/dL — AB (ref 13.0–17.0)
MCH: 31.6 pg (ref 26.0–34.0)
MCHC: 32.4 g/dL (ref 30.0–36.0)
MCV: 97.5 fL (ref 78.0–100.0)
Platelets: 336 10*3/uL (ref 150–400)
RBC: 3.64 MIL/uL — AB (ref 4.22–5.81)
RDW: 12.7 % (ref 11.5–15.5)
WBC: 5.8 10*3/uL (ref 4.0–10.5)

## 2018-07-05 MED ORDER — SODIUM CHLORIDE 0.9 % IV SOLN: INTRAVENOUS | Status: AC

## 2018-07-05 NOTE — Progress Notes (Signed)
PROGRESS NOTE    Daniel Howard  RXV:[id number] DOB: 08/19/1956 DOA: 07/02/2018 PCP: Alycia Rossetti, MD  Brief Narrative: 62 year old male with history of Crohn's disease, multiple abdominal surgeries, followed by gastroenterology in [place], now with ileostomy, large ventral hernia, readmitted early 7/12 morning with recurrent SBO  Assessment & Plan:   RecurrentSBO (small bowel obstruction) ([place]): -due to multiple abdominal surgeries - adhesions and chronic incisional hernia -Improving with supportive care -Appreciated CCS input -advance to full liq then to soft diet later today, stop IVF in few hours -Needs surgery down the road for this  Crohn's disease of both small and large intestine with complication [facility]): pt was recently seen by GI in [facility] 07/01/18. He is taking Entocort 6 mg daily and Humira injection every other week. -resume Entocort today  Prostate cancer [facility]): s/p of chemotherapy -[facility], on androgen deprivation treatment, last dose of injection was last month, and oncology, Dr. Alen Blew.  Iron deficiency anemia: Hgb stable. 11.2. -hold iron supplement until bowel obstruction resolves  DVT ppx: SCD Code Status: Full code Family Communication: wife   at bed side Disposition Plan:  home tomorrow  Consultants:   CCS   Procedures:   Antimicrobials:    Subjective: -feels better, had multiple liquid stools in his ileostomy  Objective: Vitals:   07/04/18 1333 07/04/18 2053 07/05/18 0540 07/05/18 0823  BP: 110/72 118/79 105/72   Pulse: 66 77 (!) 58   Resp:  18    Temp: 98.6 F (37 C) 98.9 F (37.2 C) 98.5 F (36.9 C)   TempSrc: Oral Oral Oral   SpO2: 100% 99% 97%   Weight:    68.4 kg (150 lb 12.7 oz)  Height:    5' 4.5" (1.638 m)    Intake/Output Summary (Last 24 hours) at 07/05/2018 1101 Last data filed at 07/05/2018 0944 Gross per 24 hour  Intake 2172.5 ml  Output 600 ml  Net 1572.5 ml   Filed Weights   07/05/18 0823  Weight: 68.4 kg (150 lb 12.7 oz)    Examination:  Gen: Awake, Alert, Oriented X 3,  HEENT: PERRLA, Neck supple, no JVD Lungs: Good air movement bilaterally, CTAB CVS: RRR,No Gallops,Rubs or new Murmurs Abdomen is nondistended, soft and nontender, multiple large scars noted, incisional hernia Extremities: No edema Skin: no new rashes Psychiatry: Judgement and insight appear normal. Mood & affect appropriate.     Data Reviewed:   CBC: Recent Labs  Lab 07/03/18 0012 07/04/18 0426 07/05/18 0509  WBC 8.7 12.4* 5.8  HGB 11.2* 11.5* 11.5*  HCT 35.2* 35.1* 35.5*  MCV 100.3* 99.2 97.5  PLT 341 322 509   Basic Metabolic Panel: Recent Labs  Lab 07/03/18 0012 07/04/18 0426 07/05/18 0509  NA 140 139 142  K 3.8 3.8 4.5  CL 107 109 112*  CO2 25 19* 24  GLUCOSE 86 48* 98  BUN 10 7 <5*  CREATININE 1.24 1.15 1.05  CALCIUM 9.1 7.9* 7.8*   GFR: Estimated Creatinine Clearance: 63.9 mL/min (by C-G formula based on SCr of 1.05 mg/dL). Liver Function Tests: Recent Labs  Lab 07/03/18 0012  AST 46*  ALT 20  ALKPHOS 50  BILITOT 0.9  PROT 7.2  ALBUMIN 3.3*   Recent Labs  Lab 07/03/18 0012  LIPASE 34   No results for input(s): AMMONIA in the last 168 hours. Coagulation Profile: Recent Labs  Lab 07/03/18 0436  INR 1.09   Cardiac Enzymes: No results for input(s): CKTOTAL, CKMB, CKMBINDEX, TROPONINI in  the last 168 hours. BNP (last 3 results) No results for input(s): PROBNP in the last 8760 hours. HbA1C: No results for input(s): HGBA1C in the last 72 hours. CBG: No results for input(s): GLUCAP in the last 168 hours. Lipid Profile: No results for input(s): CHOL, HDL, LDLCALC, TRIG, CHOLHDL, LDLDIRECT in the last 72 hours. Thyroid Function Tests: No results for input(s): TSH, T4TOTAL, FREET4, T3FREE, THYROIDAB in the last 72 hours. Anemia Panel: No results for input(s): VITAMINB12, FOLATE, FERRITIN, TIBC, IRON, RETICCTPCT in the last 72 hours. Urine  analysis:    Component Value Date/Time   COLORURINE YELLOW 07/03/2018 0004   APPEARANCEUR CLEAR 07/03/2018 0004   LABSPEC 1.024 07/03/2018 0004   PHURINE 5.0 07/03/2018 0004   GLUCOSEU NEGATIVE 07/03/2018 0004   HGBUR NEGATIVE 07/03/2018 0004   BILIRUBINUR NEGATIVE 07/03/2018 0004   KETONESUR 5 (A) 07/03/2018 0004   PROTEINUR NEGATIVE 07/03/2018 0004   UROBILINOGEN 0.2 10/10/2014 1037   NITRITE NEGATIVE 07/03/2018 0004   LEUKOCYTESUR NEGATIVE 07/03/2018 0004   Sepsis Labs: @LABRCNTIP (procalcitonin:4,lacticidven:4)  )No results found for this or any previous visit (from the past 240 hour(s)).       Radiology Studies: Dg Abd 2 Views  Result Date: 07/04/2018 CLINICAL DATA:  Small-bowel obstruction EXAM: ABDOMEN - 2 VIEW COMPARISON:  CT abdomen pelvis 07/03/2018 FINDINGS: Large abdominal hernia left containing multiple bowel loops. Mildly dilated small bowel loops especially in the right abdomen and mid abdomen suggesting partial small bowel obstruction. No free air on the upright view. Air-fluid levels in small bowel on the upright view. IMPRESSION: Mildly dilated small bowel loops with air-fluid levels suggestive of small-bowel obstruction. Large ventral hernia on the left containing bowel loops. Electronically Signed   By: Franchot Gallo M.D.   On: 07/04/2018 10:06        Scheduled Meds: . budesonide  6 mg Oral Daily   Continuous Infusions: . sodium chloride       LOS: 2 days    Time spent: 47mn    PDomenic Polite MD [facility] Page via [email or web address], password [email or web address] After 7PM please contact night-coverage  07/05/2018, 11:01 AM

## 2018-07-05 NOTE — Progress Notes (Signed)
CC: Recurrent SBO  Subjective: He is doing well no complaints of pain.  He has good stool output through his ostomy.  It soft liquidy stool.  He notes this is what his stool is like sometimes.  Is usually thicker when he is on a regular diet.  Objective: Vital signs in last 24 hours: Temp:  [98.5 F (36.9 C)-98.9 F (37.2 C)] 98.5 F (36.9 C) (07/15 0540) Pulse Rate:  [58-77] 58 (07/15 0540) Resp:  [18] 18 (07/14 2053) BP: (105-118)/(72-79) 105/72 (07/15 0540) SpO2:  [97 %-100 %] 97 % (07/15 0540) Weight:  [68.4 kg (150 lb 12.7 oz)] 68.4 kg (150 lb 12.7 oz) (07/15 0823) Last BM Date: 07/05/18 240 p.o. recorded 1692 IV 600 urine recorded No BM recorded.  Afebrile vital signs are stable Labs are stable.   Film 7/14: Shows dilated small bowel loops with air-fluid levels/small bowel obstruction; large ventral hernia on the left containing loops of bowel.  Intake/Output from previous day: 07/14 0701 - 07/15 0700 In: 1932.5 [P.O.:240; I.V.:1692.5] Out: 600 [Urine:600] Intake/Output this shift: Total I/O In: 240 [P.O.:240] Out: -   General appearance: alert, cooperative and no distress Resp: clear to auscultation bilaterally GI: soft, non-tender; bowel sounds normal; no masses,  no organomegaly and He does not appear distended this a.m.  Abdomen is soft.  He has good stool output from his ostomy currently.  Large significant scarring from his midline incisions and prior surgeries.  Lab Results:  Recent Labs    07/04/18 0426 07/05/18 0509  WBC 12.4* 5.8  HGB 11.5* 11.5*  HCT 35.1* 35.5*  PLT 322 336    BMET Recent Labs    07/04/18 0426 07/05/18 0509  NA 139 142  K 3.8 4.5  CL 109 112*  CO2 19* 24  GLUCOSE 48* 98  BUN 7 <5*  CREATININE 1.15 1.05  CALCIUM 7.9* 7.8*   PT/INR Recent Labs    07/03/18 0436  LABPROT 14.0  INR 1.09    Recent Labs  Lab 07/03/18 0012  AST 46*  ALT 20  ALKPHOS 50  BILITOT 0.9  PROT 7.2  ALBUMIN 3.3*     Lipase      Component Value Date/Time   LIPASE 34 07/03/2018 0012   Prior to Admission medications   Medication Sig Start Date End Date Taking? Authorizing Provider  Adalimumab (HUMIRA) 40 MG/0.8ML PSKT Inject 0.8 mLs (40 mg total) into the skin every 7 (seven) days. Patient taking differently: Inject 40 mg into the skin every 14 (fourteen) days.  07/01/18  Yes Carlis Stable, NP  budesonide (ENTOCORT EC) 3 MG 24 hr capsule Take 6 mg by mouth daily.   Yes [provider]  calcium carbonate (OS-CAL) 600 MG TABS tablet Take 1,200 mg by mouth daily with breakfast.   Yes [provider]  Ferrous Sulfate 28 MG TABS Take 1 tablet by mouth daily.   Yes [provider]  HYDROcodone-acetaminophen (NORCO) 5-325 MG tablet Take 1 tablet by mouth every 6 (six) hours as needed for moderate pain. 06/29/18  Yes [place], [name], MD  lidocaine-prilocaine (EMLA) cream Apply 1 application topically as needed (when accessing port).   Yes [provider]  Multiple Vitamins-Minerals (CENTRUM SILVER ADULT 50+) TABS Take 1 tablet by mouth daily.   Yes [provider]  triamcinolone ointment (KENALOG) 0.5 % Apply 1 application topically 2 (two) times daily. Patient taking differently: Apply 1 application topically 2 (two) times daily as needed (rash).  04/03/18  Yes [place], [employer], DO      Medications: . budesonide  6 mg Oral Daily   . sodium chloride 75 mL/hr at 07/05/18 0540    Assessment/Plan Prostate cancer  Recurrent small bowel obstruction History of Crohn's disease - on Humira Prior colon resection;  - colostomy/large ventral hernia/prior ostomy site revision - Dr. Burke Keels, [place], [place]  FEN: IV fluids/full liquids ID: None DVT: SCDs Follow-up: TBD  Plan: Dr. Broadus John has recommended he go to a soft diet for supper we will continue with this plan.  If he does well he can be discharged home and follow-up with his primary care and Dr. Anthony Sar in [place].     LOS: 2 days    Cantrell Larouche 07/05/2018 [phone]

## 2018-07-05 NOTE — Telephone Encounter (Signed)
Discussed with pharmacist. Madaline Brilliant to go back to pens. Increase dose to every 7 days as per previous OV note.

## 2018-07-05 NOTE — Telephone Encounter (Signed)
T/C from [facility], stating they received the prescription for the pt's Humira. The prescription received for the Humira was for prefilled syringes. Pt has been getting the pens. Randall Hiss, please advise. Call back number for pharmacy is [phone].

## 2018-07-05 NOTE — Telephone Encounter (Signed)
I spoke to Walden Field, NP and he said to call [facility] and let them know it is Ok to do the pens as long as pt receives same dose and frequency. I called and spoke to pharmacist, Wille Glaser. He is aware and will make the change.

## 2018-07-06 NOTE — Progress Notes (Signed)
Pt is discharged to go home.  Discharge instructions given.

## 2018-07-06 NOTE — Discharge Summary (Signed)
Physician Discharge Summary  Daniel Howard PFX:[id number] DOB: [birth year] DOA: 07/02/2018  PCP: Alycia Rossetti, MD  Admit date: 07/02/2018 Discharge date: 07/06/2018  Time spent: 35 minutes  Recommendations for Outpatient Follow-up:  1. Gastroenterology Dr.Rourk in 2 weeks 2. General surgeon Dr.DeMadon in 1 month   Discharge Diagnoses:  Principal Problem:   Recurrent SBO (small bowel obstruction) ([place]) Active Problems:   Crohn's disease of both small and large intestine with complication (HCC)   Prostate cancer ([place])   Incisional hernia   Iron deficiency anemia   Discharge Condition: stable  Diet recommendation: soft diet, advance as tolerated  Filed Weights   07/05/18 0823  Weight: 68.4 kg (150 lb 12.7 oz)    History of present illness:  62 year old male with history of Crohn's disease, multiple abdominal surgeries, followed by gastroenterology in [place], now with ileostomy, large ventral hernia, readmitted early 7/12 morning with recurrent SBO  Hospital Course:   RecurrentSBO (small bowel obstruction) ([place]): -due to multiple abdominal surgeries - adhesions and chronic incisional hernia -general surgery consulted -Improved with conservative management, bowel rest IV fluids, antiemetics -Tolerating soft diet now, having multiple soft stools in his ileostomy -advised to follow-up with Dr.Marc DeMason in [place]  Crohn's disease of both small and large intestine with complication ([place]): pt was recently seen by GI in [facility] 07/01/18. He is takingEntocort 6 mg daily andHumira injectionevery other week. -resume Entocort -follow-up with gastroenterology Dr.Rourk  Prostate cancer [facility]): s/p of chemotherapy -[facility], on androgen deprivation treatment, last dose of injection was last month, and oncology,Dr. Alen Blew.  Iron deficiency anemia: Hgb stable. 11.2. -holdiron supplementfor now -resume when GI symptoms much better, could be  contributing to chronic intermittent nausea    Discharge Exam: Vitals:   07/05/18 2020 07/06/18 0450  BP: 129/81 123/83  Pulse: 82 (!) 59  Resp: 16 17  Temp: 98.5 F (36.9 C) 98.4 F (36.9 C)  SpO2: 99% 99%    General: AAOx3 Cardiovascular: S1S2/RRR Respiratory: CTAB  Discharge Instructions   Discharge Instructions    Discharge instructions   Complete by:  As directed    Soft diet, advance as tolerated   Increase activity slowly   Complete by:  As directed      Allergies as of 07/06/2018      Reactions   Penicillins Hives   Has patient had a PCN reaction causing immediate rash, facial/tongue/throat swelling, SOB or lightheadedness with hypotension: Yes Has patient had a PCN reaction causing severe rash involving mucus membranes or skin necrosis: No Has patient had a PCN reaction that required hospitalization: No Has patient had a PCN reaction occurring within the last 10 years: No If all of the above answers are "NO", then may proceed with Cephalosporin use.      Medication List    STOP taking these medications   Ferrous Sulfate 28 MG Tabs     TAKE these medications   Adalimumab 40 MG/0.8ML Pskt Commonly known as:  HUMIRA Inject 0.8 mLs (40 mg total) into the skin every 7 (seven) days. What changed:  when to take this   budesonide 3 MG 24 hr capsule Commonly known as:  ENTOCORT EC Take 6 mg by mouth daily.   calcium carbonate 600 MG Tabs tablet Commonly known as:  OS-CAL Take 1,200 mg by mouth daily with breakfast.   CENTRUM SILVER ADULT 50+ Tabs Take 1 tablet by mouth daily.   HYDROcodone-acetaminophen 5-325 MG tablet Commonly known as:  NORCO Take 1 tablet  by mouth every 6 (six) hours as needed for moderate pain.   lidocaine-prilocaine cream Commonly known as:  EMLA Apply 1 application topically as needed (when accessing port).   triamcinolone ointment 0.5 % Commonly known as:  KENALOG Apply 1 application topically 2 (two) times daily. What  changed:    when to take this  reasons to take this      Allergies  Allergen Reactions  . Penicillins Hives    Has patient had a PCN reaction causing immediate rash, facial/tongue/throat swelling, SOB or lightheadedness with hypotension: Yes Has patient had a PCN reaction causing severe rash involving mucus membranes or skin necrosis: No Has patient had a PCN reaction that required hospitalization: No Has patient had a PCN reaction occurring within the last 10 years: No If all of the above answers are "NO", then may proceed with Cephalosporin use.    Follow-up Information    Rourk, Cristopher Estimable, MD. Schedule an appointment as soon as possible for a visit in 2 week(s).   Specialty:  Gastroenterology Contact information: [address] [place] [address] [phone]            The results of significant diagnostics from this hospitalization (including imaging, microbiology, ancillary and laboratory) are listed below for reference.    Significant Diagnostic Studies: Dg Chest 2 View  Result Date: 06/26/2018 CLINICAL DATA:  Short of breath EXAM: CHEST - 2 VIEW COMPARISON:  12/18/2017, 12/11/2017, FINDINGS: Right-sided central venous port tip over the distal SVC. Hyperinflation with scarring at the bilateral lung bases and chronic bronchitic changes. Normal cardiomediastinal silhouette. No pneumothorax. IMPRESSION: No active cardiopulmonary disease. Similar appearance of hyperinflation, chronic bronchitic changes and scarring in the bases. Electronically Signed   By: Donavan Foil M.D.   On: 06/26/2018 23:48   Ct Head Wo Contrast  Result Date: 06/14/2018 CLINICAL DATA:  62 year old male with frontal headache. EXAM: CT HEAD WITHOUT CONTRAST TECHNIQUE: Contiguous axial images were obtained from the base of the skull through the vertex without intravenous contrast. COMPARISON:  None. FINDINGS: Brain: The ventricles and sulci are appropriate size for patient's age. The gray-white  matter discrimination is preserved. There is no acute intracranial hemorrhage. No mass effect or midline shift. No extra-axial fluid collection. Vascular: No hyperdense vessel or unexpected calcification. Skull: Normal. Negative for fracture or focal lesion. Sinuses/Orbits: There is mild diffuse mucoperiosteal thickening of paranasal sinuses. No air-fluid levels. Right maxillary sinus retention cyst or polyp. Mastoid air cells are clear. Other: None IMPRESSION: No acute intracranial pathology. Electronically Signed   By: Anner Crete M.D.   On: 06/14/2018 22:18   Ct Abdomen Pelvis W Contrast  Result Date: 07/03/2018 CLINICAL DATA:  61 year old male with right lower quadrant abdominal pain. History of Crohn's disease. EXAM: CT ABDOMEN AND PELVIS WITH CONTRAST TECHNIQUE: Multidetector CT imaging of the abdomen and pelvis was performed using the standard protocol following bolus administration of intravenous contrast. CONTRAST:  134m OMNIPAQUE IOHEXOL 300 MG/ML  SOLN COMPARISON:  CT of the abdomen pelvis dated 04/17/2018 FINDINGS: Lower chest: Minimal bibasilar dependent atelectasis/scarring. No intra-abdominal free air or free fluid. Hepatobiliary: No focal liver abnormality is seen. No gallstones, gallbladder wall thickening, or biliary dilatation. Pancreas: The pancreas is unremarkable. Mild prominence of the main pancreatic duct similar to prior CT. No gland atrophy. Spleen: Hyperenhancing 1 cm inferior pole lesion, indeterminate, possibly a hemangioma. Adrenals/Urinary Tract: The adrenal glands, kidneys, and visualized ureters appear unremarkable. Multiple small stones noted layering along the posterior bladder wall. Stomach/Bowel: There is  postsurgical changes of the bowel with a right lower quadrant ileostomy. There is a left lateral ventral hernia containing multiple loops of small bowel as well as a long segment of the colon. There is narrowing of the bowel loops at the hernia neck with dilatation of  the loops within the hernia sac consistent with a degree of obstruction. There is adhesion of loops of small bowel to the anterior pelvic wall adjacent to the right lower quadrant ileostomy. There is mild dilatation of a loop of bowel in the pelvis measuring 4.3 cm in diameter secondary to adhesions and a degree of obstruction. Vascular/Lymphatic: The abdominal aorta and IVC appear unremarkable. No portal venous gas. Top-normal right lower quadrant lymph nodes, likely reactive. Reproductive: Similar appearance of the lobulated soft tissue lesion with areas of calcification in the right pelvic floor. 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 No acute osseous pathology. IMPRESSION: 1. Small-bowel obstruction with transition at the left lateral ventral hernia defect. Probable additional area of small-bowel obstruction in the right hemipelvis adjacent to the ileostomy secondary to adhesion. 2. Top-normal right lower quadrant lymph nodes. 3. 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 Electronically Signed   By: Anner Crete M.D.   On: 07/03/2018 03:46   Dg Abd 2 Views  Result Date: 07/04/2018 CLINICAL DATA:  Small-bowel obstruction EXAM: ABDOMEN - 2 VIEW COMPARISON:  CT abdomen pelvis 07/03/2018 FINDINGS: Large abdominal hernia left containing multiple bowel loops. Mildly dilated small bowel loops especially in the right abdomen and mid abdomen suggesting partial small bowel obstruction. No free air on the upright view. Air-fluid levels in small bowel on the upright view. IMPRESSION: Mildly dilated small bowel loops with air-fluid levels suggestive of small-bowel obstruction. Large ventral hernia on the left containing bowel loops. Electronically Signed   By: Franchot Gallo M.D.   On: 07/04/2018 10:06    Microbiology: No results found for this or  any previous visit (from the past 240 hour(s)).   Labs: Basic Metabolic Panel: Recent Labs  Lab 07/03/18 0012 07/04/18 0426 07/05/18 0509  NA 140 139 142  K 3.8 3.8 4.5  CL 107 109 112*  CO2 25 19* 24  GLUCOSE 86 48* 98  BUN 10 7 <5*  CREATININE 1.24 1.15 1.05  CALCIUM 9.1 7.9* 7.8*   Liver Function Tests: Recent Labs  Lab 07/03/18 0012  AST 46*  ALT 20  ALKPHOS 50  BILITOT 0.9  PROT 7.2  ALBUMIN 3.3*   Recent Labs  Lab 07/03/18 0012  LIPASE 34   No results for input(s): AMMONIA in the last 168 hours. CBC: Recent Labs  Lab 07/03/18 0012 07/04/18 0426 07/05/18 0509  WBC 8.7 12.4* 5.8  HGB 11.2* 11.5* 11.5*  HCT 35.2* 35.1* 35.5*  MCV 100.3* 99.2 97.5  PLT 341 322 336   Cardiac Enzymes: No results for input(s): CKTOTAL, CKMB, CKMBINDEX, TROPONINI in the last 168 hours. BNP: BNP (last 3 results) Recent Labs    06/27/18 0532  BNP 24.5    ProBNP (last 3 results) No results for input(s): PROBNP in the last 8760 hours.  CBG: No results for input(s): GLUCAP in the last 168 hours.     Signed:  [name] MD.  [facility] 07/06/2018, 1:51 PM

## 2018-07-06 NOTE — Care Management Important Message (Signed)
Important Message  Patient Details  Name: Daniel Howard MRN: [id number] Date of Birth: 01-24-1956   Medicare Important Message Given:  Yes    [name] 07/06/2018, 12:38 PM

## 2018-07-06 NOTE — Progress Notes (Signed)
    CC: Recurrent small bowel obstruction  Subjective: He feels good, tolerating soft diet ostomy is working well.  Distention has improved.  Objective: Vital signs in last 24 hours: Temp:  [98.4 F (36.9 C)-98.5 F (36.9 C)] 98.4 F (36.9 C) (07/16 0450) Pulse Rate:  [59-86] 59 (07/16 0450) Resp:  [16-17] 17 (07/16 0450) BP: (123-129)/(77-83) 123/83 (07/16 0450) SpO2:  [99 %] 99 % (07/16 0450) Last BM Date: 07/05/18 1164 p.o. 290 IV 350 urine BM x1 Afebrile vital signs are stable Labs show the potassium is 4.5, creatinine 1.05, WBC CBC is normal 5.8, hemoglobin hematocrit are stable. Intake/Output from previous day: 07/15 0701 - 07/16 0700 In: 3582 [P.O.:1164; I.V.:290] Out: 350 [Urine:350] Intake/Output this shift: Total I/O In: 300 [P.O.:300] Out: -   General appearance: alert, cooperative and no distress GI: Soft, minimally distended.  Tolerating a soft diet with good colostomy output.  Lab Results:  Recent Labs    07/04/18 0426 07/05/18 0509  WBC 12.4* 5.8  HGB 11.5* 11.5*  HCT 35.1* 35.5*  PLT 322 336    BMET Recent Labs    07/04/18 0426 07/05/18 0509  NA 139 142  K 3.8 4.5  CL 109 112*  CO2 19* 24  GLUCOSE 48* 98  BUN 7 <5*  CREATININE 1.15 1.05  CALCIUM 7.9* 7.8*   PT/INR No results for input(s): LABPROT, INR in the last 72 hours.  Recent Labs  Lab 07/03/18 0012  AST 46*  ALT 20  ALKPHOS 50  BILITOT 0.9  PROT 7.2  ALBUMIN 3.3*     Lipase     Component Value Date/Time   LIPASE 34 07/03/2018 0012     Medications: . budesonide  6 mg Oral Daily     Assessment/Plan Prostate cancer  Recurrent small bowel obstruction History of Crohn's disease - on Humira Prior colon resection;  - colostomy/large ventral hernia/prior ostomy site revision - Dr. Burke Keels, [place], [place]  FEN: soft diet ID: None DVT: SCDs Follow-up: Dr Anthony Sar PRN/GI   Plan: From our standpoint he can be discharged home on a soft diet,  follow-up with GI and Dr. Anthony Sar as needed.       LOS: 3 days    Zamirah Denny 07/06/2018 [phone]

## 2018-07-12 DIAGNOSIS — K5000 Crohn's disease of small intestine without complications: Secondary | ICD-10-CM | POA: Diagnosis not present

## 2018-07-12 DIAGNOSIS — C778 Secondary and unspecified malignant neoplasm of lymph nodes of multiple regions: Secondary | ICD-10-CM | POA: Diagnosis not present

## 2018-07-12 DIAGNOSIS — C7951 Secondary malignant neoplasm of bone: Secondary | ICD-10-CM | POA: Diagnosis not present

## 2018-07-13 ENCOUNTER — Inpatient Hospital Stay: Payer: Medicare Other

## 2018-07-13 ENCOUNTER — Ambulatory Visit: Payer: Medicare Other | Admitting: Family Medicine

## 2018-07-13 ENCOUNTER — Inpatient Hospital Stay: Payer: Medicare Other | Attending: Oncology

## 2018-07-13 DIAGNOSIS — C7951 Secondary malignant neoplasm of bone: Secondary | ICD-10-CM | POA: Insufficient documentation

## 2018-07-13 DIAGNOSIS — C61 Malignant neoplasm of prostate: Secondary | ICD-10-CM | POA: Diagnosis not present

## 2018-07-13 DIAGNOSIS — K5090 Crohn's disease, unspecified, without complications: Secondary | ICD-10-CM | POA: Diagnosis not present

## 2018-07-13 DIAGNOSIS — Z9221 Personal history of antineoplastic chemotherapy: Secondary | ICD-10-CM | POA: Insufficient documentation

## 2018-07-13 DIAGNOSIS — Z452 Encounter for adjustment and management of vascular access device: Secondary | ICD-10-CM | POA: Diagnosis not present

## 2018-07-13 DIAGNOSIS — Z95828 Presence of other vascular implants and grafts: Secondary | ICD-10-CM

## 2018-07-13 LAB — CMP (CANCER CENTER ONLY)
ALK PHOS: 47 U/L (ref 38–126)
ALT: 11 U/L (ref 0–44)
AST: 18 U/L (ref 15–41)
Albumin: 3.3 g/dL — ABNORMAL LOW (ref 3.5–5.0)
Anion gap: 9 (ref 5–15)
BUN: 12 mg/dL (ref 6–20)
CALCIUM: 9.3 mg/dL (ref 8.9–10.3)
CHLORIDE: 106 mmol/L (ref 98–111)
CO2: 27 mmol/L (ref 22–32)
Creatinine: 1.01 mg/dL (ref 0.61–1.24)
GFR, Estimated: >60 mL/min (ref >60)
Glucose, Bld: 95 mg/dL (ref 70–99)
Potassium: 3.9 mmol/L (ref 3.5–5.1)
SODIUM: 142 mmol/L (ref 135–145)
Total Bilirubin: 0.6 mg/dL (ref 0.3–1.2)
Total Protein: 7.3 g/dL (ref 6.5–8.1)

## 2018-07-13 LAB — CBC WITH DIFFERENTIAL (CANCER CENTER ONLY)
BASOS PCT: 1 %
Basophils Absolute: 0.1 10*3/uL (ref 0.0–0.1)
Eosinophils Absolute: 0.2 10*3/uL (ref 0.0–0.5)
Eosinophils Relative: 3 %
HEMATOCRIT: 33.4 % — AB (ref 38.4–49.9)
Hemoglobin: 11 g/dL — ABNORMAL LOW (ref 13.0–17.1)
Lymphocytes Relative: 28 %
Lymphs Abs: 2.1 10*3/uL (ref 0.9–3.3)
MCH: 31.4 pg (ref 27.2–33.4)
MCHC: 33 g/dL (ref 32.0–36.0)
MCV: 95.3 fL (ref 79.3–98.0)
MONOS PCT: 6 %
Monocytes Absolute: 0.5 10*3/uL (ref 0.1–0.9)
NEUTROS ABS: 4.7 10*3/uL (ref 1.5–6.5)
NEUTROS PCT: 62 %
Platelet Count: 292 10*3/uL (ref 140–400)
RBC: 3.5 MIL/uL — AB (ref 4.20–5.82)
RDW: 13.9 % (ref 11.0–14.6)
WBC Count: 7.5 10*3/uL (ref 4.0–10.3)

## 2018-07-13 MED ORDER — HEPARIN SOD (PORK) LOCK FLUSH 100 UNIT/ML IV SOLN
500.0000 [IU] | Freq: Once | INTRAVENOUS | Status: AC
  Administered 2018-07-13: 500 [IU] via INTRAVENOUS
  Filled 2018-07-13: qty 5

## 2018-07-13 MED ORDER — SODIUM CHLORIDE 0.9% FLUSH
10.0000 mL | INTRAVENOUS | Status: DC | PRN
  Administered 2018-07-13: 10 mL via INTRAVENOUS
  Filled 2018-07-13: qty 10

## 2018-07-13 NOTE — Patient Instructions (Signed)
Implanted Port Home Guide An implanted port is a type of central line that is placed under the skin. Central lines are used to provide IV access when treatment or nutrition needs to be given through a person's veins. Implanted ports are used for long-term IV access. An implanted port may be placed because:  You need IV medicine that would be irritating to the small veins in your hands or arms.  You need long-term IV medicines, such as antibiotics.  You need IV nutrition for a long period.  You need frequent blood draws for lab tests.  You need dialysis.  Implanted ports are usually placed in the chest area, but they can also be placed in the upper arm, the abdomen, or the leg. An implanted port has two main parts:  Reservoir. The reservoir is round and will appear as a small, raised area under your skin. The reservoir is the part where a needle is inserted to give medicines or draw blood.  Catheter. The catheter is a thin, flexible tube that extends from the reservoir. The catheter is placed into a large vein. Medicine that is inserted into the reservoir goes into the catheter and then into the vein.  How will I care for my incision site? Do not get the incision site wet. Bathe or shower as directed by your health care provider. How is my port accessed? Special steps must be taken to access the port:  Before the port is accessed, a numbing cream can be placed on the skin. This helps numb the skin over the port site.  Your health care provider uses a sterile technique to access the port. ? Your health care provider must put on a mask and sterile gloves. ? The skin over your port is cleaned carefully with an antiseptic and allowed to dry. ? The port is gently pinched between sterile gloves, and a needle is inserted into the port.  Only "non-coring" port needles should be used to access the port. Once the port is accessed, a blood return should be checked. This helps ensure that the port  is in the vein and is not clogged.  If your port needs to remain accessed for a constant infusion, a clear (transparent) bandage will be placed over the needle site. The bandage and needle will need to be changed every week, or as directed by your health care provider.  Keep the bandage covering the needle clean and dry. Do not get it wet. Follow your health care provider's instructions on how to take a shower or bath while the port is accessed.  If your port does not need to stay accessed, no bandage is needed over the port.  What is flushing? Flushing helps keep the port from getting clogged. Follow your health care provider's instructions on how and when to flush the port. Ports are usually flushed with saline solution or a medicine called heparin. The need for flushing will depend on how the port is used.  If the port is used for intermittent medicines or blood draws, the port will need to be flushed: ? After medicines have been given. ? After blood has been drawn. ? As part of routine maintenance.  If a constant infusion is running, the port may not need to be flushed.  How long will my port stay implanted? The port can stay in for as long as your health care provider thinks it is needed. When it is time for the port to come out, surgery will be   done to remove it. The procedure is similar to the one performed when the port was put in. When should I seek immediate medical care? When you have an implanted port, you should seek immediate medical care if:  You notice a bad smell coming from the incision site.  You have swelling, redness, or drainage at the incision site.  You have more swelling or pain at the port site or the surrounding area.  You have a fever that is not controlled with medicine.  This information is not intended to replace advice given to you by your health care provider. Make sure you discuss any questions you have with your health care provider. Document  Released: 12/08/2005 Document Revised: 05/15/2016 Document Reviewed: 08/15/2013 Elsevier Interactive Patient Education  2017 [employer]vier Inc.  

## 2018-07-14 LAB — PROSTATE-SPECIFIC AG, SERUM (LABCORP): Prostate Specific Ag, Serum: 0.5 ng/mL (ref 0.0–4.0)

## 2018-07-15 ENCOUNTER — Inpatient Hospital Stay (HOSPITAL_BASED_OUTPATIENT_CLINIC_OR_DEPARTMENT_OTHER): Payer: Medicare Other | Admitting: Oncology

## 2018-07-15 ENCOUNTER — Telehealth: Payer: Self-pay | Admitting: Oncology

## 2018-07-15 VITALS — BP 105/78 | HR 82 | Temp 98.5°F | Resp 18 | Ht 64.5 in | Wt 152.8 lb

## 2018-07-15 DIAGNOSIS — C7951 Secondary malignant neoplasm of bone: Secondary | ICD-10-CM | POA: Diagnosis not present

## 2018-07-15 DIAGNOSIS — Z9221 Personal history of antineoplastic chemotherapy: Secondary | ICD-10-CM | POA: Diagnosis not present

## 2018-07-15 DIAGNOSIS — C61 Malignant neoplasm of prostate: Secondary | ICD-10-CM | POA: Diagnosis not present

## 2018-07-15 DIAGNOSIS — Z452 Encounter for adjustment and management of vascular access device: Secondary | ICD-10-CM | POA: Diagnosis not present

## 2018-07-15 DIAGNOSIS — K5090 Crohn's disease, unspecified, without complications: Secondary | ICD-10-CM

## 2018-07-15 NOTE — Telephone Encounter (Signed)
Scheduled appt per 7/25 los - gave patient AVS and calender per los.

## 2018-07-15 NOTE — Progress Notes (Signed)
Hematology and Oncology Follow Up Visit  Daniel Howard [id number] 22-Apr-1956 62 y.o. 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  Prior Therapy:   He is status post biopsy on 03/16/2017 of a pelvic mass which confirmed the presence of prostate cancer.  Taxotere chemotherapy at 75 mg/m started on 04/24/2017. He S/P 6 cycles of therapy completed in August 2018.  Current therapy:  Androgen deprivation therapy ongoing at [facility].    Interim History: Daniel Howard is here for a follow-up visit.  Since the last visit, he was hospitalized earlier this month with small bowel obstruction that has resolved and was discharged on 07/06/2018.  During his hospitalization, he had a CT scan of the abdomen and pelvis which did not show any evidence of malignancy or any malignant cause for his small bowel obstruction.  This is discharge, he has been doing reasonably well and has recovered at this time.  He denies any abdominal pain, diarrhea or distention.  His appetite and performance status remains excellent.  He denies any bone pain or pathological fractures.   He does not report any headaches, blurry vision, syncope or seizures.  He denies any alteration in mental status or confusion.  He does not report any fevers, chills, sweats or weight loss. He does not report any chest pain, palpitation, orthopnea or leg edema. He does not report any cough, wheezing or hemoptysis. He does not report any nausea, vomiting.  He denies any changes in bowel habits.  He does not report any frequency urgency or hesitancy. He is not report any bone pain or pathological fractures.  He does not report any skin rashes or lesions.  He denies any ranges in his mood.  He denies any bleeding or clotting tendencies.  Remaining review of systems is  negative  Medications: I have reviewed the patient's current medications.  Current Outpatient Medications  Medication Sig Dispense Refill  . Adalimumab (HUMIRA) 40 MG/0.8ML PSKT Inject 0.8 mLs (40 mg total) into the skin every 7 (seven) days. (Patient taking differently: Inject 40 mg into the skin every 14 (fourteen) days. ) 4 each 3  . budesonide (ENTOCORT EC) 3 MG 24 hr capsule Take 6 mg by mouth daily.    . calcium carbonate (OS-CAL) 600 MG TABS tablet Take 1,200 mg by mouth daily with breakfast.    . HYDROcodone-acetaminophen (NORCO) 5-325 MG tablet Take 1 tablet by mouth every 6 (six) hours as needed for moderate pain. 60 tablet 0  . lidocaine-prilocaine (EMLA) cream Apply 1 application topically as needed (when accessing port).    . Multiple Vitamins-Minerals (CENTRUM SILVER ADULT 50+) TABS Take 1 tablet by mouth daily.    [name] triamcinolone ointment (KENALOG) 0.5 % Apply 1 application topically 2 (two) times daily. (Patient taking differently: Apply 1 application topically 2 (two) times daily as needed (rash). ) 30 g 0   No current facility-administered medications for this visit.    Facility-Administered Medications Ordered in Other Visits  Medication Dose Route Frequency Provider Last Rate Last Dose  . sodium chloride flush (NS) 0.9 % injection 10 mL  10 mL Intravenous PRN [name], MD   10 mL at 11/20/17 9833     Allergies:  Allergies  Allergen Reactions  . Penicillins Hives    Has patient had a PCN reaction causing immediate rash, facial/tongue/throat swelling, SOB  or lightheadedness with hypotension: Yes Has patient had a PCN reaction causing severe rash involving mucus membranes or skin necrosis: No Has patient had a PCN reaction that required hospitalization: No Has patient had a PCN reaction occurring within the last 10 years: No If all of the above answers are "NO", then may proceed with Cephalosporin use.     Past Medical History, Surgical history, Social  history, and Family History remained the same on review today.  Physical Exam: Blood pressure 105/78, pulse 82, temperature 98.5 F (36.9 C), temperature source Oral, resp. rate 18, height 5' 4.5" (1.638 m), weight 152 lb 12.8 oz (69.3 kg), SpO2 100 %.   ECOG: 1 General appearance: Alert, awake gentleman without distress. Head: Cephalic without normalities. Oropharynx: Without any thrush or ulcers. Eyes: Sclera anicteric. Lymph nodes: No cervical, supraclavicular, inguinal or axillary lymphadenopathy.    Heart: Regular rate and rhythm.  S1 and S2 without any leg edema. Lung: Clear to auscultation without any rhonchi, wheezes or dullness to percussion. Abdomin: Soft, with good bowel sounds.  No shifting dullness or ascites.  Scar tissue is noted on exam. Skeletal: No joint deformity or effusion. Neurological: No deficits noted motor, sensory or deep tendon reflexes.   Lab Results: Lab Results  Component Value Date   WBC 7.5 07/13/2018   HGB 11.0 (L) 07/13/2018   HCT 33.4 (L) 07/13/2018   MCV 95.3 07/13/2018   PLT 292 07/13/2018     Chemistry      Component Value Date/Time   NA 142 07/13/2018 1216   NA 137 11/20/2017 0931   K 3.9 07/13/2018 1216   K 3.3 (L) 11/20/2017 0931   CL 106 07/13/2018 1216   CO2 27 07/13/2018 1216   CO2 27 11/20/2017 0931   BUN 12 07/13/2018 1216   BUN 16.6 11/20/2017 0931   CREATININE 1.01 07/13/2018 1216   CREATININE 0.99 12/02/2017 1249   CREATININE 1.2 11/20/2017 0931      Component Value Date/Time   CALCIUM 9.3 07/13/2018 1216   CALCIUM 8.9 11/20/2017 0931   ALKPHOS 47 07/13/2018 1216   ALKPHOS 62 11/20/2017 0931   AST 18 07/13/2018 1216   AST 38 (H) 11/20/2017 0931   ALT 11 07/13/2018 1216   ALT 45 11/20/2017 0931   BILITOT 0.6 07/13/2018 1216   BILITOT 0.66 11/20/2017 0931      [name] (MRN [id number]) as of 07/15/2018 09:22  Ref. Range 04/14/2018 10:10 07/13/2018 12:16  Prostate Specific Ag, Serum Latest Ref  Range: 0.0 - 4.0 ng/mL 0.2 0.5     EXAM: CT ABDOMEN AND PELVIS WITH CONTRAST  TECHNIQUE: Multidetector CT imaging of the abdomen and pelvis was performed using the standard protocol following bolus administration of intravenous contrast.  CONTRAST:  171m OMNIPAQUE IOHEXOL 300 MG/ML  SOLN  COMPARISON:  CT of the abdomen pelvis dated 04/17/2018  FINDINGS: Lower chest: Minimal bibasilar dependent atelectasis/scarring.  No intra-abdominal free air or free fluid.  Hepatobiliary: No focal liver abnormality is seen. No gallstones, gallbladder wall thickening, or biliary dilatation.  Pancreas: The pancreas is unremarkable. Mild prominence of the main pancreatic duct similar to prior CT. No gland atrophy.  Spleen: Hyperenhancing 1 cm inferior pole lesion, indeterminate, possibly a hemangioma.  Adrenals/Urinary Tract: The adrenal glands, kidneys, and visualized ureters appear unremarkable. Multiple small stones noted layering along the posterior bladder wall.  Stomach/Bowel: There is postsurgical changes of the bowel with a right lower quadrant ileostomy. There is a left lateral ventral hernia  containing multiple loops of small bowel as well as a long segment of the colon. There is narrowing of the bowel loops at the hernia neck with dilatation of the loops within the hernia sac consistent with a degree of obstruction. There is adhesion of loops of small bowel to the anterior pelvic wall adjacent to the right lower quadrant ileostomy. There is mild dilatation of a loop of bowel in the pelvis measuring 4.3 cm in diameter secondary to adhesions and a degree of obstruction.  Vascular/Lymphatic: The abdominal aorta and IVC appear unremarkable. No portal venous gas. Top-normal right lower quadrant lymph nodes, likely reactive.  Reproductive: Similar appearance of the lobulated soft tissue lesion with areas of calcification in the right pelvic floor.  Other:  None  Musculoskeletal: Sclerotic bone metastasis in the pelvis similar to prior CT. No acute osseous pathology.  IMPRESSION: 1. Small-bowel obstruction with transition at the left lateral ventral hernia defect. Probable additional area of small-bowel obstruction in the right hemipelvis adjacent to the ileostomy secondary to adhesion. 2. Top-normal right lower quadrant lymph nodes. 3. 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     Impression and Plan:   62 year old man with:  1.  Castration-sensitive prostate cancer with metastatic disease including lymphadenopathy and limited bone disease.  He has completed Taxotere chemotherapy in August 2018 with a PSA nadir of 0.3.  The natural course of this disease was reviewed today as well as treatment options.  His CT scan obtained on 07/03/2018 was personally reviewed and showed no evidence of recurrent disease.  His PSA showed a slight increase but remains under control.  The plan is to continue with active surveillance at this time and treated with second line hormone therapy such as Zytiga or Xtandi if his PSA starts to rise.  He is agreeable with this plan and will continue monitoring his PSA closely.   2. IV access: Port-A-Cath mains in place and will be flushed every 6 weeks.  3.  Crohn's disease: No recent exacerbations noted.  He did have an episode of small bowel obstruction that has resolved.  4. Bone directed therapy: He has limited bony metastasis and currently receiving [facility] at [facility].  5. Androgen deprivation therapy: He continues to receive that under the care of [facility] and I recommended continuing this indefinitely.  6. Follow-up: Will be in 3 months or sooner if needed to.   15 minutes were spent face-to-face with the patient today.  More than 50% were spent on counseling,  education and discussing the natural course of this disease and future treatment options.    Zola Button, MD 7/25/20199:42 AM

## 2018-08-09 ENCOUNTER — Other Ambulatory Visit: Payer: Self-pay | Admitting: Family Medicine

## 2018-08-09 MED ORDER — HYDROCODONE-ACETAMINOPHEN 5-325 MG PO TABS: 1.0000 | ORAL_TABLET | Freq: Four times a day (QID) | ORAL | 0 refills | Status: DC | PRN

## 2018-08-09 NOTE — Telephone Encounter (Signed)
Ok to refill??  Last office visit 05/18/2018.  Last refill 06/29/2018.

## 2018-08-09 NOTE — Telephone Encounter (Signed)
Refill on hydrocodone to [facility].

## 2018-08-11 ENCOUNTER — Encounter: Payer: Self-pay | Admitting: Internal Medicine

## 2018-08-11 DIAGNOSIS — C7951 Secondary malignant neoplasm of bone: Secondary | ICD-10-CM | POA: Diagnosis not present

## 2018-08-16 ENCOUNTER — Ambulatory Visit (INDEPENDENT_AMBULATORY_CARE_PROVIDER_SITE_OTHER): Payer: Medicare Other | Admitting: Family Medicine

## 2018-08-16 ENCOUNTER — Other Ambulatory Visit: Payer: Self-pay

## 2018-08-16 ENCOUNTER — Encounter: Payer: Self-pay | Admitting: Family Medicine

## 2018-08-16 VITALS — BP 102/62 | HR 84 | Temp 98.1°F | Resp 16 | Ht 64.5 in | Wt 149.0 lb

## 2018-08-16 DIAGNOSIS — Z933 Colostomy status: Secondary | ICD-10-CM | POA: Diagnosis not present

## 2018-08-16 DIAGNOSIS — D530 Protein deficiency anemia: Secondary | ICD-10-CM | POA: Diagnosis not present

## 2018-08-16 DIAGNOSIS — K50819 Crohn's disease of both small and large intestine with unspecified complications: Secondary | ICD-10-CM

## 2018-08-16 DIAGNOSIS — K439 Ventral hernia without obstruction or gangrene: Secondary | ICD-10-CM | POA: Diagnosis not present

## 2018-08-16 NOTE — Patient Instructions (Signed)
F/U 6 months for Physical

## 2018-08-16 NOTE — Assessment & Plan Note (Signed)
Per GI

## 2018-08-16 NOTE — Progress Notes (Signed)
   Subjective:    Patient ID: Daniel Howard, male    DOB: [birth year], 62 y.o.   MRN: [id number]  Patient presents for Follow-up (is fasting)  Pt here to f/u chronic medical problems  He has recurrent SBO  Due to scar tissue and hernia.   Takes pain medicine as prescribed   Crohns now on once a week on humira  Had recent labs on 7/23 Still gets diarrhea in ostomy bag, occ takes immodium  PSA- still androgen deprvation from [facility] and [facility] for the boney metastisis   Recently has been getting SOB, seen at hospital , EKG unremarkable few PVC, CXR showed some scarring and bronchitis changes , no further SOB  Weight has been between 149-153lbs, appetite overall is good     Review Of Systems:  GEN- denies fatigue, fever, weight loss,weakness, recent illness HEENT- denies eye drainage, change in vision, nasal discharge, CVS- denies chest pain, palpitations RESP- denies SOB, cough, wheeze ABD- denies N/V, change in stools, abd pain GU- denies dysuria, hematuria, dribbling, incontinence MSK- denies joint pain, muscle aches, injury Neuro- denies headache, dizziness, syncope, seizure activity       Objective:    BP 102/62   Pulse 84   Temp 98.1 F (36.7 C) (Oral)   Resp 16   Ht 5' 4.5" (1.638 m)   Wt 149 lb (67.6 kg)   SpO2 98%   BMI 25.18 kg/m  GEN- NAD, alert and oriented x3 HEENT- PERRL, EOMI, non injected sclera, pink conjunctiva, MMM, oropharynx clear CVS- RRR, no murmur RESP-CTAB ABD-NABS,soft, NT, ostomy in tact- loose stool in bag, stoma pink, hernia to side of midline incision EXT- No edema Pulses- Radial 2+        Assessment & Plan:      Problem List Items Addressed This Visit      Unprioritized   Anemia    Hemoglobin is stable reviewed his labs at bedside with him and his wife      Colostomy status ([place])    Per GI      Crohn's disease of both small and large intestine with complication ([place]) - Primary    Primary gastroenterology I  did review the recent labs as well as his recent office visits.  He is continued on his current infusions He is on chronic pain medication which we will continue. He is maintaining his weight in the setting of these recent exacerbations or hospitalizations.      Ventral hernia      Note: This dictation was prepared with Dragon dictation along with smaller phrase technology. Any transcriptional errors that result from this process are unintentional.

## 2018-08-16 NOTE — Assessment & Plan Note (Signed)
Hemoglobin is stable reviewed his labs at bedside with him and his wife

## 2018-08-16 NOTE — Assessment & Plan Note (Addendum)
Primary gastroenterology I did review the recent labs as well as his recent office visits.  He is continued on his current infusions He is on chronic pain medication which we will continue. He is maintaining his weight in the setting of these recent exacerbations or hospitalizations.

## 2018-08-20 ENCOUNTER — Other Ambulatory Visit (HOSPITAL_COMMUNITY): Payer: Self-pay | Admitting: Urology

## 2018-08-20 DIAGNOSIS — C61 Malignant neoplasm of prostate: Secondary | ICD-10-CM

## 2018-08-26 ENCOUNTER — Inpatient Hospital Stay: Payer: Medicare Other | Attending: Oncology

## 2018-08-26 DIAGNOSIS — C61 Malignant neoplasm of prostate: Secondary | ICD-10-CM | POA: Diagnosis present

## 2018-08-26 DIAGNOSIS — Z452 Encounter for adjustment and management of vascular access device: Secondary | ICD-10-CM | POA: Insufficient documentation

## 2018-08-26 MED ORDER — HEPARIN SOD (PORK) LOCK FLUSH 100 UNIT/ML IV SOLN
500.0000 [IU] | Freq: Once | INTRAVENOUS | Status: AC | PRN
  Administered 2018-08-26: 500 [IU]
  Filled 2018-08-26: qty 5

## 2018-08-26 MED ORDER — SODIUM CHLORIDE 0.9% FLUSH
10.0000 mL | INTRAVENOUS | Status: DC | PRN
  Administered 2018-08-26: 10 mL
  Filled 2018-08-26: qty 10

## 2018-08-30 ENCOUNTER — Encounter (HOSPITAL_COMMUNITY): Payer: Medicare Other

## 2018-08-30 ENCOUNTER — Other Ambulatory Visit (HOSPITAL_COMMUNITY): Payer: Medicare Other

## 2018-09-01 ENCOUNTER — Encounter (HOSPITAL_COMMUNITY)
Admission: RE | Admit: 2018-09-01 | Discharge: 2018-09-01 | Disposition: A | Discharge status: Home / Self Care | Payer: Medicare Other | Source: Ambulatory Visit | Attending: Urology | Admitting: Urology

## 2018-09-01 ENCOUNTER — Other Ambulatory Visit: Payer: Self-pay | Admitting: Nurse Practitioner

## 2018-09-01 DIAGNOSIS — C61 Malignant neoplasm of prostate: Secondary | ICD-10-CM | POA: Insufficient documentation

## 2018-09-01 DIAGNOSIS — M8580 Other specified disorders of bone density and structure, unspecified site: Secondary | ICD-10-CM | POA: Diagnosis not present

## 2018-09-01 MED ORDER — TECHNETIUM TC 99M MEDRONATE IV KIT
20.0000 | PACK | Freq: Once | INTRAVENOUS | Status: AC | PRN
  Administered 2018-09-01: 19.4 via INTRAVENOUS

## 2018-09-03 DIAGNOSIS — K5090 Crohn's disease, unspecified, without complications: Secondary | ICD-10-CM | POA: Diagnosis not present

## 2018-09-03 DIAGNOSIS — Z933 Colostomy status: Secondary | ICD-10-CM | POA: Diagnosis not present

## 2018-09-09 DIAGNOSIS — C7951 Secondary malignant neoplasm of bone: Secondary | ICD-10-CM | POA: Diagnosis not present

## 2018-09-14 ENCOUNTER — Other Ambulatory Visit: Payer: Self-pay | Admitting: *Deleted

## 2018-09-14 NOTE — Telephone Encounter (Signed)
Received call from patient.   Requested refill on Hydrocodone/APAP.  Ok to refill??  Last office visit 08/16/2018.  Last refill 08/09/2018.

## 2018-09-15 MED ORDER — HYDROCODONE-ACETAMINOPHEN 5-325 MG PO TABS: 1.0000 | ORAL_TABLET | Freq: Four times a day (QID) | ORAL | 0 refills | Status: DC | PRN

## 2018-09-21 ENCOUNTER — Telehealth: Payer: Self-pay

## 2018-09-21 NOTE — Telephone Encounter (Signed)
Received paperwork from [facility] [phone]). Information received said medical clarification request for medication was needed. When I called [employer], they said they've been trying to reach the pt to schedule delivery of medication.   Spoke with Mrs. Garland and she's going to reach out to the company again to schedule medication delivery.  If spouse has any problems, she will call back.

## 2018-09-26 ENCOUNTER — Other Ambulatory Visit: Payer: Self-pay | Admitting: Family Medicine

## 2018-09-30 ENCOUNTER — Emergency Department (HOSPITAL_COMMUNITY): Payer: Medicare Other

## 2018-09-30 ENCOUNTER — Encounter (HOSPITAL_COMMUNITY): Payer: Self-pay | Admitting: *Deleted

## 2018-09-30 ENCOUNTER — Other Ambulatory Visit: Payer: Self-pay

## 2018-09-30 ENCOUNTER — Observation Stay (HOSPITAL_COMMUNITY): Payer: Medicare Other

## 2018-09-30 ENCOUNTER — Observation Stay (HOSPITAL_COMMUNITY)
Admission: EM | Admit: 2018-09-30 | Discharge: 2018-10-02 | Disposition: A | Discharge status: Home / Self Care | Payer: Medicare Other | Attending: Internal Medicine | Admitting: Internal Medicine

## 2018-09-30 DIAGNOSIS — Z79899 Other long term (current) drug therapy: Secondary | ICD-10-CM | POA: Insufficient documentation

## 2018-09-30 DIAGNOSIS — F329 Major depressive disorder, single episode, unspecified: Secondary | ICD-10-CM | POA: Insufficient documentation

## 2018-09-30 DIAGNOSIS — C7951 Secondary malignant neoplasm of bone: Secondary | ICD-10-CM | POA: Insufficient documentation

## 2018-09-30 DIAGNOSIS — N210 Calculus in bladder: Secondary | ICD-10-CM | POA: Insufficient documentation

## 2018-09-30 DIAGNOSIS — Z4682 Encounter for fitting and adjustment of non-vascular catheter: Secondary | ICD-10-CM | POA: Diagnosis not present

## 2018-09-30 DIAGNOSIS — K5090 Crohn's disease, unspecified, without complications: Secondary | ICD-10-CM | POA: Diagnosis not present

## 2018-09-30 DIAGNOSIS — Z4659 Encounter for fitting and adjustment of other gastrointestinal appliance and device: Secondary | ICD-10-CM

## 2018-09-30 DIAGNOSIS — Z4689 Encounter for fitting and adjustment of other specified devices: Secondary | ICD-10-CM

## 2018-09-30 DIAGNOSIS — M8580 Other specified disorders of bone density and structure, unspecified site: Secondary | ICD-10-CM | POA: Insufficient documentation

## 2018-09-30 DIAGNOSIS — K8020 Calculus of gallbladder without cholecystitis without obstruction: Secondary | ICD-10-CM | POA: Diagnosis not present

## 2018-09-30 DIAGNOSIS — K56609 Unspecified intestinal obstruction, unspecified as to partial versus complete obstruction: Secondary | ICD-10-CM | POA: Diagnosis present

## 2018-09-30 DIAGNOSIS — R1084 Generalized abdominal pain: Secondary | ICD-10-CM | POA: Diagnosis not present

## 2018-09-30 DIAGNOSIS — E869 Volume depletion, unspecified: Secondary | ICD-10-CM | POA: Diagnosis not present

## 2018-09-30 DIAGNOSIS — D509 Iron deficiency anemia, unspecified: Secondary | ICD-10-CM | POA: Diagnosis not present

## 2018-09-30 DIAGNOSIS — Z87891 Personal history of nicotine dependence: Secondary | ICD-10-CM | POA: Diagnosis not present

## 2018-09-30 DIAGNOSIS — C61 Malignant neoplasm of prostate: Secondary | ICD-10-CM | POA: Diagnosis not present

## 2018-09-30 DIAGNOSIS — K8689 Other specified diseases of pancreas: Secondary | ICD-10-CM | POA: Diagnosis not present

## 2018-09-30 DIAGNOSIS — R197 Diarrhea, unspecified: Secondary | ICD-10-CM | POA: Diagnosis not present

## 2018-09-30 DIAGNOSIS — K565 Intestinal adhesions [bands], unspecified as to partial versus complete obstruction: Secondary | ICD-10-CM | POA: Diagnosis not present

## 2018-09-30 DIAGNOSIS — Z880 Allergy status to penicillin: Secondary | ICD-10-CM | POA: Insufficient documentation

## 2018-09-30 DIAGNOSIS — K4090 Unilateral inguinal hernia, without obstruction or gangrene, not specified as recurrent: Secondary | ICD-10-CM | POA: Diagnosis not present

## 2018-09-30 DIAGNOSIS — Z8249 Family history of ischemic heart disease and other diseases of the circulatory system: Secondary | ICD-10-CM | POA: Diagnosis not present

## 2018-09-30 DIAGNOSIS — Z9049 Acquired absence of other specified parts of digestive tract: Secondary | ICD-10-CM | POA: Diagnosis not present

## 2018-09-30 DIAGNOSIS — K436 Other and unspecified ventral hernia with obstruction, without gangrene: Principal | ICD-10-CM | POA: Insufficient documentation

## 2018-09-30 DIAGNOSIS — K5669 Other partial intestinal obstruction: Secondary | ICD-10-CM | POA: Diagnosis not present

## 2018-09-30 DIAGNOSIS — K432 Incisional hernia without obstruction or gangrene: Secondary | ICD-10-CM | POA: Insufficient documentation

## 2018-09-30 DIAGNOSIS — Z933 Colostomy status: Secondary | ICD-10-CM | POA: Diagnosis not present

## 2018-09-30 LAB — CBC
HEMATOCRIT: 36 % — AB (ref 39.0–52.0)
HEMOGLOBIN: 11.4 g/dL — AB (ref 13.0–17.0)
MCH: 30.4 pg (ref 26.0–34.0)
MCHC: 31.7 g/dL (ref 30.0–36.0)
MCV: 96 fL (ref 80.0–100.0)
NRBC: 0 % (ref 0.0–0.2)
Platelets: 341 10*3/uL (ref 150–400)
RBC: 3.75 MIL/uL — AB (ref 4.22–5.81)
RDW: 13.3 % (ref 11.5–15.5)
WBC: 7.8 10*3/uL (ref 4.0–10.5)

## 2018-09-30 LAB — COMPREHENSIVE METABOLIC PANEL
ALK PHOS: 38 U/L (ref 38–126)
ALT: 12 U/L (ref 0–44)
AST: 24 U/L (ref 15–41)
Albumin: 3.2 g/dL — ABNORMAL LOW (ref 3.5–5.0)
Anion gap: 8 (ref 5–15)
BUN: 14 mg/dL (ref 6–20)
CO2: 24 mmol/L (ref 22–32)
Calcium: 8.5 mg/dL — ABNORMAL LOW (ref 8.9–10.3)
Chloride: 105 mmol/L (ref 98–111)
Creatinine, Ser: 1.15 mg/dL (ref 0.61–1.24)
GLUCOSE: 99 mg/dL (ref 70–99)
Potassium: 3.8 mmol/L (ref 3.5–5.1)
SODIUM: 137 mmol/L (ref 135–145)
Total Bilirubin: 0.9 mg/dL (ref 0.3–1.2)
Total Protein: 7.1 g/dL (ref 6.5–8.1)

## 2018-09-30 LAB — URINALYSIS, ROUTINE W REFLEX MICROSCOPIC
Bilirubin Urine: NEGATIVE
GLUCOSE, UA: NEGATIVE mg/dL
HGB URINE DIPSTICK: NEGATIVE
Ketones, ur: NEGATIVE mg/dL
LEUKOCYTES UA: NEGATIVE
Nitrite: NEGATIVE
PH: 5 (ref 5.0–8.0)
Protein, ur: NEGATIVE mg/dL
SPECIFIC GRAVITY, URINE: 1.027 (ref 1.005–1.030)

## 2018-09-30 LAB — LIPASE, BLOOD: Lipase: 25 U/L (ref 11–51)

## 2018-09-30 LAB — PHOSPHORUS: Phosphorus: 2.9 mg/dL (ref 2.5–4.6)

## 2018-09-30 LAB — MAGNESIUM: Magnesium: 1.9 mg/dL (ref 1.7–2.4)

## 2018-09-30 MED ORDER — IOPAMIDOL (ISOVUE-370) INJECTION 76%
INTRAVENOUS | Status: AC
  Filled 2018-09-30: qty 100

## 2018-09-30 MED ORDER — CALCIUM CARBONATE 1500 (600 CA) MG PO TABS
1200.0000 mg | ORAL_TABLET | Freq: Every day | ORAL | Status: DC
  Filled 2018-09-30: qty 1

## 2018-09-30 MED ORDER — MORPHINE SULFATE (PF) 4 MG/ML IV SOLN
4.0000 mg | Freq: Once | INTRAVENOUS | Status: AC
  Administered 2018-09-30: 4 mg via INTRAVENOUS
  Filled 2018-09-30: qty 1

## 2018-09-30 MED ORDER — MENTHOL 3 MG MT LOZG
1.0000 | LOZENGE | OROMUCOSAL | Status: DC | PRN
  Filled 2018-09-30: qty 9

## 2018-09-30 MED ORDER — LIDOCAINE-PRILOCAINE 2.5-2.5 % EX CREA: 1.0000 "application " | TOPICAL_CREAM | CUTANEOUS | Status: DC | PRN

## 2018-09-30 MED ORDER — CALCIUM CARBONATE 1250 (500 CA) MG PO TABS
1.0000 | ORAL_TABLET | Freq: Every day | ORAL | Status: DC
  Administered 2018-10-01 – 2018-10-02 (×2): 500 mg via ORAL
  Filled 2018-09-30 (×2): qty 1

## 2018-09-30 MED ORDER — MORPHINE SULFATE (PF) 4 MG/ML IV SOLN
4.0000 mg | INTRAVENOUS | Status: DC | PRN
  Administered 2018-09-30: 4 mg via INTRAVENOUS
  Filled 2018-09-30: qty 1

## 2018-09-30 MED ORDER — ADALIMUMAB 40 MG/0.8ML ~~LOC~~ AJKT: 1.0000 | AUTO-INJECTOR | SUBCUTANEOUS | Status: DC

## 2018-09-30 MED ORDER — BUDESONIDE 3 MG PO CPEP
6.0000 mg | ORAL_CAPSULE | Freq: Every day | ORAL | Status: DC
  Administered 2018-09-30 – 2018-10-02 (×3): 6 mg via ORAL
  Filled 2018-09-30 (×4): qty 2

## 2018-09-30 MED ORDER — DEXTROSE-NACL 5-0.45 % IV SOLN
INTRAVENOUS | Status: DC
  Administered 2018-09-30 – 2018-10-01 (×2): via INTRAVENOUS

## 2018-09-30 MED ORDER — HYDROCODONE-ACETAMINOPHEN 5-325 MG PO TABS
1.0000 | ORAL_TABLET | Freq: Four times a day (QID) | ORAL | Status: DC | PRN
  Filled 2018-09-30: qty 1

## 2018-09-30 MED ORDER — ADULT MULTIVITAMIN W/MINERALS CH
1.0000 | ORAL_TABLET | Freq: Every day | ORAL | Status: DC
  Administered 2018-09-30 – 2018-10-02 (×3): 1 via ORAL
  Filled 2018-09-30 (×4): qty 1

## 2018-09-30 MED ORDER — ENOXAPARIN SODIUM 40 MG/0.4ML ~~LOC~~ SOLN
40.0000 mg | SUBCUTANEOUS | Status: DC
  Administered 2018-09-30 – 2018-10-01 (×2): 40 mg via SUBCUTANEOUS
  Filled 2018-09-30 (×2): qty 0.4

## 2018-09-30 MED ORDER — FERROUS SULFATE 325 (65 FE) MG PO TABS
325.0000 mg | ORAL_TABLET | Freq: Every day | ORAL | Status: DC
  Administered 2018-10-01 – 2018-10-02 (×2): 325 mg via ORAL
  Filled 2018-09-30 (×2): qty 1

## 2018-09-30 MED ORDER — MIRTAZAPINE 15 MG PO TABS
15.0000 mg | ORAL_TABLET | Freq: Every day | ORAL | Status: DC
  Administered 2018-09-30 – 2018-10-01 (×2): 15 mg via ORAL
  Filled 2018-09-30 (×2): qty 1

## 2018-09-30 MED ORDER — IOHEXOL 300 MG/ML  SOLN
100.0000 mL | Freq: Once | INTRAMUSCULAR | Status: AC
  Administered 2018-09-30: 80 mL via INTRAVENOUS

## 2018-09-30 MED ORDER — SODIUM CHLORIDE 0.9 % IV BOLUS
1000.0000 mL | Freq: Once | INTRAVENOUS | Status: AC
  Administered 2018-09-30: 1000 mL via INTRAVENOUS

## 2018-09-30 MED ORDER — ONDANSETRON HCL 4 MG/2ML IJ SOLN
4.0000 mg | Freq: Once | INTRAMUSCULAR | Status: AC
  Administered 2018-09-30: 4 mg via INTRAVENOUS
  Filled 2018-09-30: qty 2

## 2018-09-30 MED ORDER — PHENOL 1.4 % MT LIQD
1.0000 | OROMUCOSAL | Status: DC | PRN
  Administered 2018-09-30: 1 via OROMUCOSAL
  Filled 2018-09-30: qty 177

## 2018-09-30 NOTE — ED Notes (Signed)
X-ray notified pt ready for exam post-NG placement

## 2018-09-30 NOTE — ED Notes (Signed)
Pt notified of need for NG tube. RN awaiting throat spray from pharmacy. Pt stating he will allow RN to attempt. Per surgical team, if unsuccessful RN may place order for DG NG placement.

## 2018-09-30 NOTE — H&P (Signed)
History and Physical  TREMON SAINVIL LTJ:[id number] DOB: [birth year] DOA: 09/30/2018  Referring physician: ER physician PCP: Alycia Rossetti, MD  Outpatient Specialists:    Patient coming from: Home  Chief Complaint: Abdominal cramps for 2 days  HPI: Patient is a 62 year old male with past medical history significant for small bowel obstruction, Crohn's disease, duodenal ulcer and prostate cancer.  Patient is a very poor historian.  Patient only reported 2-day history of abdominal cramps, mainly around the central abdominal area.  However, collateral information revealed that patient has also had nausea and vomiting.  CT scan of the abdomen and pelvis is worrisome for spigelian hernia, possible active Crohn's disease, possible small bowel obstruction and progressive pancreatic ductal dilatation, cholelithiasis, bladder calculi and findings that could be suggestive of metastatic disease of the prostate.  Hospitalist team has been asked to admit patient for further assessment and management.    ED Course: Patient was managed supportively in the ER patient.  Patient has been kept n.p.o.  Surgical team has been consulted to assist with patient's management.  Pertinent labs: Chemistry reveals sodium of 137, potassium of 3.8, chloride 105, CO2 24, BUN of 14 and creatinine of 1.15 with blood sugar of 99.  CBC reveals WBC of 7.8, hemoglobin 11.4, hematocrit 36, MCV of 96 with platelet count of 341.  UA reveals a specific gravity 1.027.  CT scan of abdomen and pelvis with contrast revealed "Osseous metastases of the pelvis.  Large LEFT spigelian hernia containing multiple small bowel loops including a dilated small bowel loop, question source of observed small-bowel obstruction versus related to adhesions in the pelvis.  Prior RIGHT hemicolectomy.   Bowel wall thickening of RIGHT lower quadrant and RIGHT mid abdomen small bowel loops, could reflect active Crohn's disease.  Progressive pancreatic ductal  dilatation since 07/03/2018, duct now 6 mm diameter, though no definite obstructing mass or stone is evident; consider follow-up MR imaging.  Nonspecific enlarged LEFT para-aortic node new since previous study, could reflect metastatic disease in this patient with a history of prostate cancer.  Stable abnormal soft tissue at the RIGHT inferior pelvis question related to prostate cancer versus prior therapy.  Cholelithiasis.  Bladder calculi.  EKG: Independently reviewed.  Imaging: independently reviewed.   Review of Systems:  Negative for fever, visual changes, sore throat, rash, new muscle aches, chest pain, SOB, dysuria, bleeding.  Past Medical History:  Diagnosis Date  . Abnormal finding of biliary tract    MRCP shows pancreatic/biliary tract dilation. EUS 2010 confirmed dilation but no chronic pancreaitis or mass. Vascular ectasia crimpoing distal CBD.   [name] Anxiety   . 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 Duodenal ulcer 2010   nsaids  . History of blood transfusion 1992   "related to colon OR"  . Peristomal hernia   . Prostate cancer ([place]) 2018  . Small bowel obstruction ([place]) 10/2017; 11/21/2017; 02/16/2018  . Spigelian hernia    bilateral    Past Surgical History:  Procedure Laterality Date  . APPENDECTOMY  1992   at time of exp laparotomy at which time he was noted to have fistulizing Crohn's rather than UC  . COLON SURGERY    . COLONOSCOPY [name]  08/23/2014   XVQ:[id number] proctoscopy with possible fistulous opening in thebase of rectal/anal stump.    . COLOSTOMY  1992   transverse loop colostomy secondary to a stricture  .  ESOPHAGOGASTRODUODENOSCOPY  05/2009   SLF: multiple antral erosions, large ulcer at ansatomosis (postsurgical changes at duodenal bulb and second portion of duodenum) BX c/x NSAIDS.  . EUS  10/04/2009   Dr. Estill Bakes dilated CBD and main pancreatic duct.  No pancreatic  . EXPLORATORY LAPAROTOMY  1992  . FLEXIBLE SIGMOIDOSCOPY  1988   Dr. Laural Golden- suggested rohn's disease but the biopsies were not collaborative.  [name] FLEXIBLE SIGMOIDOSCOPY [name] 09/08/2016   Procedure: FLEXIBLE SIGMOIDOSCOPY;  Surgeon: Wonda Horner, MD;  Location: [facility] ENDOSCOPY;  Service: Gastroenterology;  Laterality: [name];  . HEMICOLOECTOMY W/ ANASTOMOSIS  1993   R- Dr.DeMason   . HERNIA REPAIR  1996   incarcerated periostial hernia with additional surgery in 1999  . IR FLUORO GUIDE PORT INSERTION RIGHT  04/03/2017  . IR US GUIDE VASC ACCESS RIGHT  04/03/2017     reports that he quit smoking about 8 years ago. His smoking use included cigarettes. He has a 24.00 pack-year smoking history. He has never used smokeless tobacco. He reports that he does not drink alcohol or use drugs.  Allergies  Allergen Reactions  . Penicillins Hives    Has patient had a PCN reaction causing immediate rash, facial/tongue/throat swelling, SOB or lightheadedness with hypotension: Yes Has patient had a PCN reaction causing severe rash involving mucus membranes or skin necrosis: No Has patient had a PCN reaction that required hospitalization: No Has patient had a PCN reaction occurring within the last 10 years: No If all of the above answers are "NO", then may proceed with Cephalosporin use.     Family History  Problem Relation Age of Onset  . Cancer Father        prostate   . Prostate cancer Father   . Colon cancer Father 30  . Hypertension Sister   . Cancer Sister   . Depression Sister   . Breast cancer Sister   . COPD Sister   . Aneurysm Brother        deceased, brain aneurysm     Prior to Admission medications   Medication Sig  Start Date End Date Taking? Authorizing Provider  Adalimumab (HUMIRA PEN) 40 MG/0.8ML PNKT Inject 1 Syringe into the skin once a week. 09/01/18  Yes Annitta Needs, NP  budesonide (ENTOCORT EC) 3 MG 24 hr capsule Take 6 mg by mouth daily.   Yes [provider]  calcium carbonate (OS-CAL) 600 MG TABS tablet Take 1,200 mg by mouth daily with breakfast.   Yes [provider]  ferrous sulfate 325 (65 FE) MG tablet Take 325 mg by mouth daily with breakfast.   Yes [provider]  HYDROcodone-acetaminophen (NORCO) 5-325 MG tablet Take 1 tablet by mouth every 6 (six) hours as needed for moderate pain. 09/15/18  Yes Susy Frizzle, MD  lidocaine-prilocaine (EMLA) cream Apply 1 application topically as needed (when accessing port).   Yes [provider]  mirtazapine (REMERON) 15 MG tablet TAKE 1 TABLET BY MOUTH AT BEDTIME Patient taking differently: Take 15 mg by mouth at bedtime.  09/27/18  Yes [place], [name], MD  Multiple Vitamins-Minerals (CENTRUM SILVER ADULT 50+) TABS Take 1 tablet by mouth daily.   Yes [provider]  Adalimumab (HUMIRA) 40 MG/0.8ML PSKT Inject 0.8 mLs (40 mg total) into the skin every 7 (  seven) days. Patient not taking: Reported on 09/30/2018 07/01/18   Carlis Stable, NP  triamcinolone ointment (KENALOG) 0.5 % Apply 1 application topically 2 (two) times daily. Patient not taking: Reported on 09/30/2018 04/03/18   Dannielle Huh, DO    Physical Exam: Vitals:   09/30/18 0815 09/30/18 0830 09/30/18 0845 09/30/18 1051  BP: 102/63 113/83 113/79 107/73  Pulse: 68 65 68 74  Resp:   16 14  Temp:      TempSrc:      SpO2: 99% 100% 99% 99%  Weight:      Height:        Constitutional:  . Appears calm and comfortable Eyes:  [name] Mild pallor. No jaundice.  Dry buccal mucosa  ENMT:  . external ears, nose appear normal Neck:  . Neck is supple. No JVD Respiratory:  . CTA bilaterally, no w/r/r.  . Respiratory effort normal. No retractions or  accessory muscle use Cardiovascular:  . S1S2 . No LE extremity edema   Abdomen:  . Abdomen is flat, soft and non tender.  Midline scar from prior surgery most likely and right lower quadrant colostomy bag.  Organs are difficult to assess. Neurologic:  . Awake and alert. . Moves all limbs.  Wt Readings from Last 3 Encounters:  09/30/18 66.2 kg  08/16/18 67.6 kg  07/15/18 69.3 kg    I have personally reviewed following labs and imaging studies  Labs on Admission:  CBC: Recent Labs  Lab 09/30/18 0521  WBC 7.8  HGB 11.4*  HCT 36.0*  MCV 96.0  PLT 165   Basic Metabolic Panel: Recent Labs  Lab 09/30/18 0521  NA 137  K 3.8  CL 105  CO2 24  GLUCOSE 99  BUN 14  CREATININE 1.15  CALCIUM 8.5*   Liver Function Tests: Recent Labs  Lab 09/30/18 0521  AST 24  ALT 12  ALKPHOS 38  BILITOT 0.9  PROT 7.1  ALBUMIN 3.2*   Recent Labs  Lab 09/30/18 0521  LIPASE 25   No results for input(s): AMMONIA in the last 168 hours. Coagulation Profile: No results for input(s): INR, PROTIME in the last 168 hours. Cardiac Enzymes: No results for input(s): CKTOTAL, CKMB, CKMBINDEX, TROPONINI in the last 168 hours. BNP (last 3 results) No results for input(s): PROBNP in the last 8760 hours. HbA1C: No results for input(s): HGBA1C in the last 72 hours. CBG: No results for input(s): GLUCAP in the last 168 hours. Lipid Profile: No results for input(s): CHOL, HDL, LDLCALC, TRIG, CHOLHDL, LDLDIRECT in the last 72 hours. Thyroid Function Tests: No results for input(s): TSH, T4TOTAL, FREET4, T3FREE, THYROIDAB in the last 72 hours. Anemia Panel: No results for input(s): VITAMINB12, FOLATE, FERRITIN, TIBC, IRON, RETICCTPCT in the last 72 hours. Urine analysis:    Component Value Date/Time   COLORURINE YELLOW 09/30/2018 [place] 09/30/2018 0517   LABSPEC 1.027 09/30/2018 0517   PHURINE 5.0 09/30/2018 [place] 09/30/2018 0517   HGBUR NEGATIVE  09/30/2018 0517   BILIRUBINUR NEGATIVE 09/30/2018 0517   KETONESUR NEGATIVE 09/30/2018 0517   PROTEINUR NEGATIVE 09/30/2018 0517   UROBILINOGEN 0.2 10/10/2014 1037   NITRITE NEGATIVE 09/30/2018 0517   LEUKOCYTESUR NEGATIVE 09/30/2018 0517   Sepsis Labs: @LABRCNTIP (procalcitonin:4,lacticidven:4) )No results found for this or any previous visit (from the past 240 hour(s)).    Radiological Exams on Admission: Ct Abdomen Pelvis W Contrast  Result Date: 09/30/2018 CLINICAL DATA:  Generalized abdominal pain with diarrhea for 3 days,  history appendectomy, colectomy, irritable bowel syndrome, small-bowel obstruction, prostate cancer, duodenal ulcer, Crohn's disease, spigelian hernia EXAM: CT ABDOMEN AND PELVIS WITH CONTRAST TECHNIQUE: Multidetector CT imaging of the abdomen and pelvis was performed using the standard protocol following bolus administration of intravenous contrast. Sagittal and coronal MPR images reconstructed from axial data set. CONTRAST:  17m OMNIPAQUE IOHEXOL 300 MG/ML SOLN IV. No oral contrast. COMPARISON:  07/03/2018 FINDINGS: Lower chest: Dependent bibasilar atelectasis Hepatobiliary: Tiny dependent gallstones in gallbladder. Liver unremarkable. No biliary dilatation. Pancreas: Pancreatic ductal dilatation common duct up to 6 mm diameter, previously 3-4 mm. No pancreatic mass is definitely visualized. Distal CBD and distal pancreatic duct at the pancreatic head showed no definite evidence of mass or calcification. Spleen: Normal appearance Adrenals/Urinary Tract: Adrenal glands normal appearance. Kidneys and ureters normal appearance. Dependent calculi within urinary bladder., Stomach/Bowel: Extension of multiple small bowel loops into a large LEFT spigelian hernia. One of the small bowel loops within the hernia is dilated, question point of obstruction. Prior RIGHT colonic resection. Decompressed colon. Ostomy in the RIGHT lower quadrant. Dilated small bowel loops in mid  abdomen/pelvis up to 3.5 cm diameter in the LEFT pelvis and 5.0 cm diameter in central pelvis. Component of small bowel obstruction is present likely due to adhesions. Associated mild wall thickening of small bowel loops in the RIGHT abdomen, with mild surrounding infiltrative changes suggesting an inflammatory process, likely due to Crohn's disease. Vascular/Lymphatic: Multiple normal sized mesenteric lymph nodes in RIGHT mid abdomen. Scattered LEFT para-aortic nodes, including a 14 mm LEFT para-aortic node image 30. Retroaortic LEFT renal vein. Remaining vascular structures grossly patent. Reproductive: Small prostate gland with abnormal soft tissue extending from prostate and seminal vesicles into the RIGHT hemipelvis, could represent tumor or sequela of prior surgery, lobulated appearance, overall measuring 6.4 x 3.1 cm, little changed. A Other: Small LEFT inguinal hernia containing fat. LEFT spigelian hernia containing bowel as previously noted. No free air or free fluid. Musculoskeletal: Sclerotic foci within pelvis question metastatic lesions from prostate cancer. Ankylosis of BILATERAL SI joints. IMPRESSION: Osseous metastases of the pelvis. Large LEFT spigelian hernia containing multiple small bowel loops including a dilated small bowel loop, question source of observed small-bowel obstruction versus related to adhesions in the pelvis. Prior RIGHT hemicolectomy. Bowel wall thickening of RIGHT lower quadrant and RIGHT mid abdomen small bowel loops, could reflect active Crohn's disease. Progressive pancreatic ductal dilatation since 07/03/2018, duct now 6 mm diameter, though no definite obstructing mass or stone is evident; consider follow-up MR imaging. Nonspecific enlarged LEFT para-aortic node new since previous study, could reflect metastatic disease in this patient with a history of prostate cancer. Stable abnormal soft tissue at the RIGHT inferior pelvis question related to prostate cancer versus prior  therapy. Cholelithiasis Bladder calculi. Electronically Signed   By: MLavonia DanaM.D.   On: 09/30/2018 09:59    EKG: Independently reviewed.   Active Problems:   * No active hospital problems. *   Assessment/Plan Possible small bowel obstruction. Possible Crohn's exacerbation. Volume depletion. Spigelian hernia  Admit patient for further assessment and management Adequate hydration N.p.o. Continue management for Crohn's disease Will defer management of the spigelian hernia and possible small bowel obstruction to the surgical team.  Further management will depend on hospital course.  DVT prophylaxis: Subcutaneous Lovenox Code Status: Full Family Communication:  Disposition Plan: Home eventually Consults called: Surgical team called by ER Admission status: Observation  Time spent: 60 minutes  SDana Allan MD  [facility] Pager #: [phone]  [phone] 7PM-7AM contact night coverage as above  09/30/2018, 11:21 AM

## 2018-09-30 NOTE — ED Notes (Signed)
Attempted to give report 

## 2018-09-30 NOTE — ED Provider Notes (Signed)
[facility] EMERGENCY DEPARTMENT Provider Note   CSN: [id number] Arrival date & time: 09/30/18  0451     History   Chief Complaint Chief Complaint  Patient presents with  . Abdominal Pain    HPI Daniel Howard is a 62 y.o. male hx of Crohn's disease with recurrent SBO s/p R colostomy, here presenting with abdominal pain, nausea vomiting and diarrhea.  Patient states that symptoms have been going on for the last 3 days.  Patient has been having looser stool in his colostomy bag.  Also several episodes of nausea vomiting.  So has diffuse abdominal cramps.  No associated fevers or urinary symptoms. Patient follows up with [name] and is on humira.   The history is provided by the patient.    Past Medical History:  Diagnosis Date  . Abnormal finding of biliary tract    MRCP shows pancreatic/biliary tract dilation. EUS 2010 confirmed dilation but no chronic pancreaitis or mass. Vascular ectasia crimpoing distal CBD.   [name] Anxiety   . 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 Duodenal ulcer 2010   nsaids  . History of blood transfusion 1992   "related to colon OR"  . Peristomal hernia   . Prostate cancer ([place]) 2018  . Small bowel obstruction ([place]) 10/2017; 11/21/2017; 02/16/2018  . Spigelian hernia    bilateral    Patient Active Problem List   Diagnosis Date Noted  . Iron deficiency anemia 07/03/2018  . 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 Nausea without vomiting 11/10/2017  . Incisional hernia 09/22/2017  . Abdominal pain of multiple sites 09/22/2017  . Goals of care, counseling/discussion 04/02/2017  .  Prostate cancer ([place]) 04/02/2017  . Elevated PSA 03/04/2017  . Osteopenia 11/03/2016  . Pelvic mass in male 09/04/2016  . 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 Exacerbation of Crohn's disease ([place]) 09/04/2016  . Protein-calorie malnutrition, severe ([place]) 07/27/2014  . Loss of weight 06/09/2014  . Crohn's disease of both small and large intestine with complication ([place]) [id number]/[id number]  . Boils 02/11/2013  . Ventral hernia 12/11/2009  . Anemia 12/03/2009  . Regional enteritis/Crohn's 01/25/2007    Past Surgical History:  Procedure Laterality Date  . APPENDECTOMY  1992   at time of exp laparotomy at which time he was noted to have fistulizing Crohn's rather than UC  . COLON SURGERY    . COLONOSCOPY [name] 08/23/2014   GOT:LXBWIOM proctoscopy with possible fistulous opening in thebase of rectal/anal stump.    . COLOSTOMY  1992   transverse loop colostomy secondary to a stricture  . ESOPHAGOGASTRODUODENOSCOPY  05/2009   SLF: multiple antral erosions, large ulcer at ansatomosis (postsurgical changes at duodenal bulb and second portion of duodenum) BX c/x NSAIDS.  . EUS  10/04/2009   Dr. Estill Bakes dilated CBD and main pancreatic duct.  No pancreatic  . EXPLORATORY LAPAROTOMY  1992  . FLEXIBLE SIGMOIDOSCOPY  1988   Dr. Laural Golden- suggested rohn's disease but the biopsies were not collaborative.  [name] FLEXIBLE SIGMOIDOSCOPY [name] 09/08/2016   Procedure: FLEXIBLE SIGMOIDOSCOPY;  Surgeon: Wonda Horner, MD;  Location: [facility] ENDOSCOPY;  Service: Gastroenterology;  Laterality: [name];  . HEMICOLOECTOMY W/ ANASTOMOSIS  1993   R- Dr.DeMason   .  HERNIA REPAIR  1996   incarcerated periostial hernia with additional surgery in 1999  . IR FLUORO GUIDE PORT INSERTION RIGHT  04/03/2017  . IR US GUIDE VASC ACCESS RIGHT  04/03/2017        Home Medications    Prior to Admission medications   Medication Sig Start Date End Date Taking? Authorizing Provider  Adalimumab (HUMIRA PEN) 40 MG/0.8ML PNKT Inject 1 Syringe into the  skin once a week. 09/01/18  Yes Annitta Needs, NP  budesonide (ENTOCORT EC) 3 MG 24 hr capsule Take 6 mg by mouth daily.   Yes [provider]  calcium carbonate (OS-CAL) 600 MG TABS tablet Take 1,200 mg by mouth daily with breakfast.   Yes [provider]  ferrous sulfate 325 (65 FE) MG tablet Take 325 mg by mouth daily with breakfast.   Yes [provider]  HYDROcodone-acetaminophen (NORCO) 5-325 MG tablet Take 1 tablet by mouth every 6 (six) hours as needed for moderate pain. 09/15/18  Yes Susy Frizzle, MD  lidocaine-prilocaine (EMLA) cream Apply 1 application topically as needed (when accessing port).   Yes [provider]  mirtazapine (REMERON) 15 MG tablet TAKE 1 TABLET BY MOUTH AT BEDTIME Patient taking differently: Take 15 mg by mouth at bedtime.  09/27/18  Yes , [name], MD  Multiple Vitamins-Minerals (CENTRUM SILVER ADULT 50+) TABS Take 1 tablet by mouth daily.   Yes [provider]  Adalimumab (HUMIRA) 40 MG/0.8ML PSKT Inject 0.8 mLs (40 mg total) into the skin every 7 (seven) days. Patient not taking: Reported on 09/30/2018 07/01/18   Carlis Stable, NP  triamcinolone ointment (KENALOG) 0.5 % Apply 1 application topically 2 (two) times daily. Patient not taking: Reported on 09/30/2018 04/03/18   Dannielle Huh, DO    Family History Family History  Problem Relation Age of Onset  . Cancer Father        prostate   . Prostate cancer Father   . Colon cancer Father 92  . Hypertension Sister   . Cancer Sister   . Depression Sister   . Breast cancer Sister   . COPD Sister   . Aneurysm Brother        deceased, brain aneurysm    Social History Social History   Tobacco Use  . Smoking status: Former Smoker    Packs/day: 0.75    Years: 32.00    Pack years: 24.00    Types: Cigarettes    Last attempt to quit: 12/21/2009    Years since quitting: 8.7  . Smokeless tobacco: Never Used  Substance Use Topics  . Alcohol use: No     Alcohol/week: 0.0 standard drinks    Comment: Former drinker  . Drug use: No     Allergies   Penicillins   Review of Systems Review of Systems  Gastrointestinal: Positive for abdominal pain, diarrhea and vomiting.  All other systems reviewed and are negative.    Physical Exam Updated Vital Signs BP 107/73 (BP Location: Right Arm)   Pulse 74   Temp 98.8 F (37.1 C) (Oral)   Resp 14   Ht 5' 4.5" (1.638 m)   Wt 66.2 kg   SpO2 99%   BMI 24.67 kg/m   Physical Exam  Constitutional: He is oriented to person, place, and time.  Uncomfortable, dehydrated   HENT:  Head: Normocephalic.  MM dry   Eyes: Pupils are equal, round, and reactive to light. EOM are normal.  Cardiovascular: Normal rate and regular  rhythm.  Pulmonary/Chest: Effort normal and breath sounds normal.  Abdominal:  Complex surgical scars healing well. + periumbilical tenderness, colostomy with loose stools   Neurological: He is alert and oriented to person, place, and time.  Skin: Skin is warm. Capillary refill takes less than 2 seconds.  Psychiatric: He has a normal mood and affect. His behavior is normal.  Nursing note and vitals reviewed.    ED Treatments / Results  Labs (all labs ordered are listed, but only abnormal results are displayed) Labs Reviewed  COMPREHENSIVE METABOLIC PANEL - Abnormal; Notable for the following components:      Result Value   Calcium 8.5 (*)    Albumin 3.2 (*)    All other components within normal limits  CBC - Abnormal; Notable for the following components:   RBC 3.75 (*)    Hemoglobin 11.4 (*)    HCT 36.0 (*)    All other components within normal limits  LIPASE, BLOOD  URINALYSIS, ROUTINE W REFLEX MICROSCOPIC    EKG None  Radiology Ct Abdomen Pelvis W Contrast  Result Date: 09/30/2018 CLINICAL DATA:  Generalized abdominal pain with diarrhea for 3 days, history appendectomy, colectomy, irritable bowel syndrome, small-bowel obstruction, prostate cancer,  duodenal ulcer, Crohn's disease, spigelian hernia EXAM: CT ABDOMEN AND PELVIS WITH CONTRAST TECHNIQUE: Multidetector CT imaging of the abdomen and pelvis was performed using the standard protocol following bolus administration of intravenous contrast. Sagittal and coronal MPR images reconstructed from axial data set. CONTRAST:  40m OMNIPAQUE IOHEXOL 300 MG/ML SOLN IV. No oral contrast. COMPARISON:  07/03/2018 FINDINGS: Lower chest: Dependent bibasilar atelectasis Hepatobiliary: Tiny dependent gallstones in gallbladder. Liver unremarkable. No biliary dilatation. Pancreas: Pancreatic ductal dilatation common duct up to 6 mm diameter, previously 3-4 mm. No pancreatic mass is definitely visualized. Distal CBD and distal pancreatic duct at the pancreatic head showed no definite evidence of mass or calcification. Spleen: Normal appearance Adrenals/Urinary Tract: Adrenal glands normal appearance. Kidneys and ureters normal appearance. Dependent calculi within urinary bladder., Stomach/Bowel: Extension of multiple small bowel loops into a large LEFT spigelian hernia. One of the small bowel loops within the hernia is dilated, question point of obstruction. Prior RIGHT colonic resection. Decompressed colon. Ostomy in the RIGHT lower quadrant. Dilated small bowel loops in mid abdomen/pelvis up to 3.5 cm diameter in the LEFT pelvis and 5.0 cm diameter in central pelvis. Component of small bowel obstruction is present likely due to adhesions. Associated mild wall thickening of small bowel loops in the RIGHT abdomen, with mild surrounding infiltrative changes suggesting an inflammatory process, likely due to Crohn's disease. Vascular/Lymphatic: Multiple normal sized mesenteric lymph nodes in RIGHT mid abdomen. Scattered LEFT para-aortic nodes, including a 14 mm LEFT para-aortic node image 30. Retroaortic LEFT renal vein. Remaining vascular structures grossly patent. Reproductive: Small prostate gland with abnormal soft tissue  extending from prostate and seminal vesicles into the RIGHT hemipelvis, could represent tumor or sequela of prior surgery, lobulated appearance, overall measuring 6.4 x 3.1 cm, little changed. A Other: Small LEFT inguinal hernia containing fat. LEFT spigelian hernia containing bowel as previously noted. No free air or free fluid. Musculoskeletal: Sclerotic foci within pelvis question metastatic lesions from prostate cancer. Ankylosis of BILATERAL SI joints. IMPRESSION: Osseous metastases of the pelvis. Large LEFT spigelian hernia containing multiple small bowel loops including a dilated small bowel loop, question source of observed small-bowel obstruction versus related to adhesions in the pelvis. Prior RIGHT hemicolectomy. Bowel wall thickening of RIGHT lower quadrant and RIGHT mid abdomen small  bowel loops, could reflect active Crohn's disease. Progressive pancreatic ductal dilatation since 07/03/2018, duct now 6 mm diameter, though no definite obstructing mass or stone is evident; consider follow-up MR imaging. Nonspecific enlarged LEFT para-aortic node new since previous study, could reflect metastatic disease in this patient with a history of prostate cancer. Stable abnormal soft tissue at the RIGHT inferior pelvis question related to prostate cancer versus prior therapy. Cholelithiasis Bladder calculi. Electronically Signed   By: Lavonia Dana M.D.   On: 09/30/2018 09:59    Procedures Procedures (including critical care time)  Medications Ordered in ED Medications  sodium chloride 0.9 % bolus 1,000 mL (0 mLs Intravenous Stopped 09/30/18 1049)  ondansetron (ZOFRAN) injection 4 mg (4 mg Intravenous Given 09/30/18 0820)  morphine 4 MG/ML injection 4 mg (4 mg Intravenous Given 09/30/18 0820)  iohexol (OMNIPAQUE) 300 MG/ML solution 100 mL (80 mLs Intravenous Contrast Given 09/30/18 0902)     Initial Impression / Assessment and Plan / ED Course  I have reviewed the triage vital signs and the nursing  notes.  Pertinent labs & imaging results that were available during my care of the patient were reviewed by me and considered in my medical decision making (see chart for details).    AMOL DOMANSKI is a 62 y.o. male here with abdominal pain, vomiting, diarrhea. Consider gastro vs partial SBO vs fistula formation. Will get labs, UA, CT ab/pel. Will hydrate and reassess.   11:03 AM CT showed multiple hernias and partial SBO. I consulted Janett Billow from surgery to see patient. Ordered NG tube. Hospitalist to admit for SBO.   Final Clinical Impressions(s) / ED Diagnoses   Final diagnoses:  None    ED Discharge Orders    None       Drenda Freeze, MD 09/30/18 1103

## 2018-09-30 NOTE — Consult Note (Signed)
[facility] Consult Note  Daniel Howard 1956-09-30  [id number].    Requesting MD: Darl Householder Chief Complaint/Reason for Consult: SBO/hernia HPI:  Patient is a 62 year old male who presents with abdominal pain, nausea, vomiting and diarrhea. This started 3 days ago. Patient describes pain as a cramping feeling along old midline scar, intermittent. Also reports nausea without vomiting and what he feels like have been looser stools from ileostomy. He denies other symptoms. He has a history of Crohn's disease and is followed by [facility], currently on humira weekly. Last Crohn's flare was around 3-4 months ago. He has had multiple abdominal surgeries in the past secondary to Crohn's stricture, all done by Dr. Anthony Sar in [place], [place]. Patient is also currently receiving chemotherapy for metastatic prostate cancer. He is allergic to penicillins. He denies drug, alcohol or tobacco use.   Patient expressed that he really wishes to avoid surgery if possible because he was told in the past that he really should not have any more surgery unless it is an emergency.   ROS: Review of Systems  Constitutional: Negative for chills and fever.  Respiratory: Negative for shortness of breath and wheezing.   Cardiovascular: Negative for chest pain.  Gastrointestinal: Positive for abdominal pain, diarrhea and nausea. Negative for blood in stool, melena and vomiting.  Genitourinary: Negative for dysuria, frequency and urgency.  All other systems reviewed and are negative.   Family History  Problem Relation Age of Onset  . Cancer Father        prostate   . Prostate cancer Father   . Colon cancer Father 68  . Hypertension Sister   . Cancer Sister   . Depression Sister   . Breast cancer Sister   . COPD Sister   . Aneurysm Brother        deceased, brain aneurysm    Past Medical History:  Diagnosis Date  . Abnormal finding of biliary tract    MRCP shows pancreatic/biliary tract dilation. EUS 2010  confirmed dilation but no chronic pancreaitis or mass. Vascular ectasia crimpoing distal CBD.   [name] Anxiety   . 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 Duodenal ulcer 2010   nsaids  . History of blood transfusion 1992   "related to colon OR"  . Peristomal hernia   . Prostate cancer ([place]) 2018  . Small bowel obstruction ([place]) 10/2017; 11/21/2017; 02/16/2018  . Spigelian hernia    bilateral    Past Surgical History:  Procedure Laterality Date  . APPENDECTOMY  1992   at time of exp laparotomy at which time he was noted to have fistulizing Crohn's rather than UC  . COLON SURGERY    . COLONOSCOPY [name] 08/23/2014   NPY:[id number] proctoscopy with possible fistulous opening in thebase of rectal/anal stump.    . COLOSTOMY  1992   transverse loop colostomy secondary to a stricture  . ESOPHAGOGASTRODUODENOSCOPY  05/2009   SLF: multiple antral erosions, large ulcer at ansatomosis (postsurgical changes at duodenal bulb and second portion of duodenum) BX c/x NSAIDS.  . EUS  10/04/2009   Dr. Estill Bakes dilated CBD and main pancreatic duct.  No pancreatic  . EXPLORATORY LAPAROTOMY  1992  . FLEXIBLE SIGMOIDOSCOPY  1988   Dr. Laural Golden- suggested rohn's disease but the biopsies were not collaborative.  [name] FLEXIBLE SIGMOIDOSCOPY [name] 09/08/2016   Procedure: FLEXIBLE SIGMOIDOSCOPY;  Surgeon: Wonda Horner, MD;  Location: [facility] ENDOSCOPY;  Service: Gastroenterology;  Laterality: [name];  . HEMICOLOECTOMY W/ ANASTOMOSIS  1993   R- Dr.DeMason   . HERNIA REPAIR  1996   incarcerated periostial hernia with additional surgery in 1999  . IR FLUORO GUIDE PORT INSERTION RIGHT  04/03/2017   . IR US GUIDE VASC ACCESS RIGHT  04/03/2017    Social History:  reports that he quit smoking about 8 years ago. His smoking use included cigarettes. He has a 24.00 pack-year smoking history. He has never used smokeless tobacco. He reports that he does not drink alcohol or use drugs.  Allergies:  Allergies  Allergen Reactions  . Penicillins Hives    Has patient had a PCN reaction causing immediate rash, facial/tongue/throat swelling, SOB or lightheadedness with hypotension: Yes Has patient had a PCN reaction causing severe rash involving mucus membranes or skin necrosis: No Has patient had a PCN reaction that required hospitalization: No Has patient had a PCN reaction occurring within the last 10 years: No If all of the above answers are "NO", then may proceed with Cephalosporin use.      (Not in a hospital admission)  Blood pressure 107/73, pulse 74, temperature 98.8 F (37.1 C), temperature source Oral, resp. rate 14, height 5' 4.5" (1.638 m), weight 66.2 kg, SpO2 99 %. Physical Exam: Physical Exam  Constitutional: He is oriented to person, place, and time. He appears well-developed and well-nourished. He is cooperative.  Non-toxic appearance. No distress.  HENT:  Head: Normocephalic and atraumatic.  Right Ear: External ear normal.  Left Ear: External ear normal.  Nose: Nose normal.  Mouth/Throat: Oropharynx is clear and moist and mucous membranes are normal. Abnormal dentition.  Eyes: Pupils are equal, round, and reactive to light. Conjunctivae, EOM and lids are normal.  Neck: Normal range of motion and phonation normal. Neck supple. No tracheal deviation present.  Cardiovascular: Normal rate and regular rhythm.  Pulses:      Radial pulses are 2+ on the right side, and 2+ on the left side.       Dorsalis pedis pulses are 2+ on the right side, and 2+ on the left side.  Pulmonary/Chest: Effort normal and breath sounds normal.  Abdominal: Soft. Bowel sounds are normal. He  exhibits no distension. There is no tenderness. There is no rigidity, no rebound and no guarding. A hernia (left incisional, large, not reducible but does not seem strangulated/incarcerated) is present.  Multiple surgical scars present; RLQ ileostomy with pink prolapsed appearing stoma and liquid stool present  Musculoskeletal:  ROM grossly intact in bilateral upper and lower extremities  Neurological: He is alert and oriented to person, place, and time. He has normal strength. No sensory deficit.  Skin: Skin is warm, dry and intact.  Psychiatric: He has a normal mood and affect. His speech is normal and behavior is normal.    Results for orders placed or performed during the hospital encounter of 09/30/18 (from the past 48 hour(s))  Urinalysis, Routine w reflex microscopic     Status: None   Collection Time: 09/30/18  5:17 AM  Result Value Ref Range   Color, Urine YELLOW YELLOW   APPearance CLEAR CLEAR   Specific Gravity, Urine 1.027 1.005 - 1.030   pH 5.0 5.0 - 8.0   Glucose, UA NEGATIVE NEGATIVE mg/dL  Hgb urine dipstick NEGATIVE NEGATIVE   Bilirubin Urine NEGATIVE NEGATIVE   Ketones, ur NEGATIVE NEGATIVE mg/dL   Protein, ur NEGATIVE NEGATIVE mg/dL   Nitrite NEGATIVE NEGATIVE   Leukocytes, UA NEGATIVE NEGATIVE    Comment: Performed at [facility] [address] [place], [place] [address]  Lipase, blood     Status: None   Collection Time: 09/30/18  5:21 AM  Result Value Ref Range   Lipase 25 11 - 51 U/L    Comment: Performed at [facility] [address] [place], [place] [address]  Comprehensive metabolic panel     Status: Abnormal   Collection Time: 09/30/18  5:21 AM  Result Value Ref Range   Sodium 137 135 - 145 mmol/L   Potassium 3.8 3.5 - 5.1 mmol/L   Chloride 105 98 - 111 mmol/L   CO2 24 22 - 32 mmol/L   Glucose, Bld 99 70 - 99 mg/dL   BUN 14 6 - 20 mg/dL   Creatinine, Ser 1.15 0.61 - 1.24 mg/dL   Calcium 8.5 (L) 8.9 - 10.3 mg/dL   Total Protein 7.1  6.5 - 8.1 g/dL   Albumin 3.2 (L) 3.5 - 5.0 g/dL   AST 24 15 - 41 U/L   ALT 12 0 - 44 U/L   Alkaline Phosphatase 38 38 - 126 U/L   Total Bilirubin 0.9 0.3 - 1.2 mg/dL   GFR calc non Af Amer >60 >60 mL/min   GFR calc Af Amer >60 >60 mL/min    Comment: (NOTE) The eGFR has been calculated using the CKD EPI equation. This calculation has not been validated in all clinical situations. eGFR's persistently <60 mL/min signify possible Chronic Kidney Disease.    Anion gap 8 5 - 15    Comment: Performed at [facility] [address] [place], [place] [address]  CBC     Status: Abnormal   Collection Time: 09/30/18  5:21 AM  Result Value Ref Range   WBC 7.8 4.0 - 10.5 K/uL   RBC 3.75 (L) 4.22 - 5.81 MIL/uL   Hemoglobin 11.4 (L) 13.0 - 17.0 g/dL   HCT 36.0 (L) 39.0 - 52.0 %   MCV 96.0 80.0 - 100.0 fL   MCH 30.4 26.0 - 34.0 pg   MCHC 31.7 30.0 - 36.0 g/dL   RDW 13.3 11.5 - 15.5 %   Platelets 341 150 - 400 K/uL   nRBC 0.0 0.0 - 0.2 %    Comment: Performed at [facility], [place] [address] [place],  [address]   Ct Abdomen Pelvis W Contrast  Result Date: 09/30/2018 CLINICAL DATA:  Generalized abdominal pain with diarrhea for 3 days, history appendectomy, colectomy, irritable bowel syndrome, small-bowel obstruction, prostate cancer, duodenal ulcer, Crohn's disease, spigelian hernia EXAM: CT ABDOMEN AND PELVIS WITH CONTRAST TECHNIQUE: Multidetector CT imaging of the abdomen and pelvis was performed using the standard protocol following bolus administration of intravenous contrast. Sagittal and coronal MPR images reconstructed from axial data set. CONTRAST:  77m OMNIPAQUE IOHEXOL 300 MG/ML SOLN IV. No oral contrast. COMPARISON:  07/03/2018 FINDINGS: Lower chest: Dependent bibasilar atelectasis Hepatobiliary: Tiny dependent gallstones in gallbladder. Liver unremarkable. No biliary dilatation. Pancreas: Pancreatic ductal dilatation common duct up to 6 mm diameter, previously 3-4 mm.  No pancreatic mass is definitely visualized. Distal CBD and distal pancreatic duct at the pancreatic head showed no definite evidence of mass or calcification. Spleen: Normal appearance Adrenals/Urinary Tract: Adrenal glands normal appearance. Kidneys  and ureters normal appearance. Dependent calculi within urinary bladder., Stomach/Bowel: Extension of multiple small bowel loops into a large LEFT spigelian hernia. One of the small bowel loops within the hernia is dilated, question point of obstruction. Prior RIGHT colonic resection. Decompressed colon. Ostomy in the RIGHT lower quadrant. Dilated small bowel loops in mid abdomen/pelvis up to 3.5 cm diameter in the LEFT pelvis and 5.0 cm diameter in central pelvis. Component of small bowel obstruction is present likely due to adhesions. Associated mild wall thickening of small bowel loops in the RIGHT abdomen, with mild surrounding infiltrative changes suggesting an inflammatory process, likely due to Crohn's disease. Vascular/Lymphatic: Multiple normal sized mesenteric lymph nodes in RIGHT mid abdomen. Scattered LEFT para-aortic nodes, including a 14 mm LEFT para-aortic node image 30. Retroaortic LEFT renal vein. Remaining vascular structures grossly patent. Reproductive: Small prostate gland with abnormal soft tissue extending from prostate and seminal vesicles into the RIGHT hemipelvis, could represent tumor or sequela of prior surgery, lobulated appearance, overall measuring 6.4 x 3.1 cm, little changed. A Other: Small LEFT inguinal hernia containing fat. LEFT spigelian hernia containing bowel as previously noted. No free air or free fluid. Musculoskeletal: Sclerotic foci within pelvis question metastatic lesions from prostate cancer. Ankylosis of BILATERAL SI joints. IMPRESSION: Osseous metastases of the pelvis. Large LEFT spigelian hernia containing multiple small bowel loops including a dilated small bowel loop, question source of observed small-bowel  obstruction versus related to adhesions in the pelvis. Prior RIGHT hemicolectomy. Bowel wall thickening of RIGHT lower quadrant and RIGHT mid abdomen small bowel loops, could reflect active Crohn's disease. Progressive pancreatic ductal dilatation since 07/03/2018, duct now 6 mm diameter, though no definite obstructing mass or stone is evident; consider follow-up MR imaging. Nonspecific enlarged LEFT para-aortic node new since previous study, could reflect metastatic disease in this patient with a history of prostate cancer. Stable abnormal soft tissue at the RIGHT inferior pelvis question related to prostate cancer versus prior therapy. Cholelithiasis Bladder calculi. Electronically Signed   By: Lavonia Dana M.D.   On: 09/30/2018 09:59      Assessment/Plan Crohn's disease - on humira weekly Metastatic prostate cancer - on current chemotherapy  SBO secondary to chronic incisional hernia - patient hoping to avoid surgery at this point - will give a trial of non-operative management, but will likely need hernia repair at some point in the future (this could be done as an outpatient if he resolves sbo) - NGT decompression, bowel rest, IVF - check abdominal film in the AM, and monitor for signs of peritonitis - agree with medical admission for trial of non-operative management of sbo, we will follow as well    Brigid Re , [facility] Surgery 09/30/2018, 11:15 AM Pager: [phone] Consults: [phone] Mon-Fri 7:00 am-4:30 pm Sat-Sun 7:00 am-11:30 am

## 2018-09-30 NOTE — ED Triage Notes (Signed)
The pt is c/o abd pain with nausea vomiting and ddiRRHEA FOR 3 DAYS  HX OF CROHNS DISEASE

## 2018-09-30 NOTE — ED Notes (Signed)
DG Abd portable clicked off in accident

## 2018-10-01 ENCOUNTER — Observation Stay (HOSPITAL_COMMUNITY): Payer: Medicare Other

## 2018-10-01 DIAGNOSIS — K5669 Other partial intestinal obstruction: Secondary | ICD-10-CM | POA: Diagnosis not present

## 2018-10-01 DIAGNOSIS — K436 Other and unspecified ventral hernia with obstruction, without gangrene: Secondary | ICD-10-CM | POA: Diagnosis not present

## 2018-10-01 DIAGNOSIS — K56609 Unspecified intestinal obstruction, unspecified as to partial versus complete obstruction: Secondary | ICD-10-CM | POA: Diagnosis not present

## 2018-10-01 DIAGNOSIS — D509 Iron deficiency anemia, unspecified: Secondary | ICD-10-CM | POA: Diagnosis not present

## 2018-10-01 DIAGNOSIS — K432 Incisional hernia without obstruction or gangrene: Secondary | ICD-10-CM | POA: Diagnosis not present

## 2018-10-01 DIAGNOSIS — C61 Malignant neoplasm of prostate: Secondary | ICD-10-CM

## 2018-10-01 DIAGNOSIS — K50919 Crohn's disease, unspecified, with unspecified complications: Secondary | ICD-10-CM | POA: Diagnosis not present

## 2018-10-01 LAB — COMPREHENSIVE METABOLIC PANEL
ALT: 12 U/L (ref 0–44)
AST: 20 U/L (ref 15–41)
Albumin: 2.8 g/dL — ABNORMAL LOW (ref 3.5–5.0)
Alkaline Phosphatase: 42 U/L (ref 38–126)
Anion gap: 4 — ABNORMAL LOW (ref 5–15)
BUN: 8 mg/dL (ref 6–20)
CO2: 26 mmol/L (ref 22–32)
Calcium: 7.8 mg/dL — ABNORMAL LOW (ref 8.9–10.3)
Chloride: 109 mmol/L (ref 98–111)
Creatinine, Ser: 1.11 mg/dL (ref 0.61–1.24)
GFR calc Af Amer: >60 mL/min (ref >60)
GFR calc non Af Amer: >60 mL/min (ref >60)
Glucose, Bld: 92 mg/dL (ref 70–99)
Potassium: 3.7 mmol/L (ref 3.5–5.1)
Sodium: 139 mmol/L (ref 135–145)
Total Bilirubin: 0.7 mg/dL (ref 0.3–1.2)
Total Protein: 6.1 g/dL — ABNORMAL LOW (ref 6.5–8.1)

## 2018-10-01 LAB — CBC
HCT: 34.3 % — ABNORMAL LOW (ref 39.0–52.0)
Hemoglobin: 11 g/dL — ABNORMAL LOW (ref 13.0–17.0)
MCH: 30.5 pg (ref 26.0–34.0)
MCHC: 32.1 g/dL (ref 30.0–36.0)
MCV: 95 fL (ref 80.0–100.0)
Platelets: 283 10*3/uL (ref 150–400)
RBC: 3.61 MIL/uL — ABNORMAL LOW (ref 4.22–5.81)
RDW: 13.2 % (ref 11.5–15.5)
WBC: 5.6 10*3/uL (ref 4.0–10.5)
nRBC: 0 % (ref 0.0–0.2)

## 2018-10-01 MED ORDER — ORAL CARE MOUTH RINSE
15.0000 mL | Freq: Two times a day (BID) | OROMUCOSAL | Status: DC
  Administered 2018-10-01 (×2): 15 mL via OROMUCOSAL

## 2018-10-01 NOTE — Plan of Care (Signed)
  Problem: Education: Goal: Knowledge of General Education information will improve Description Including pain rating scale, medication(s)/side effects and non-pharmacologic comfort measures Outcome: Progressing   Problem: Nutrition: Goal: Adequate nutrition will be maintained Outcome: Progressing   Problem: Coping: Goal: Level of anxiety will decrease Outcome: Progressing   Problem: Elimination: Goal: Will not experience complications related to bowel motility Outcome: Progressing   Problem: Pain Managment: Goal: General experience of comfort will improve Outcome: Progressing

## 2018-10-01 NOTE — Progress Notes (Signed)
PROGRESS NOTE    Daniel Howard  UXN:[id number] DOB: 1956/01/28 DOA: 09/30/2018 PCP: Alycia Rossetti, MD    Brief Narrative:  62 year old male who presented with abdominal pain.  He does have significant past medical history for Crohn's disease, duodenal ulcers, prostate cancer and prior history of small bowel obstruction.  Patient reported 2 days of abdominal pain, colicky in nature, mainly in the central abdomen, associated with nausea and vomiting.  Her initial physical examination blood pressure 102/63, heart rate 68, respirate 16, oxygen saturation 99%, lungs clear to auscultation bilaterally, heart S1-S2 present rhythmic, abdomen was soft and nontender, no lower extremity edema.  Sodium 137, potassium 3.8, chloride 105, bicarb 24, glucose 99, BUN 14, creatinine 1.15, AST 24, ALT 12, white count 7.8, hemoglobin 9.4, hematocrit 36.0, platelets 341, this is negative for infection.  CT of the abdomen showed large left spigelian hernia containing multiple small bowel loops, consistent with small bowel obstruction.  Multiple osseous metastatic lesions.  Progressive pancreatic duct dilatation.  Cholelithiasis and bladder calculi.  Patient was admitted with working diagnosis of small bowel obstruction.  Assessment & Plan:   Active Problems:   SBO (small bowel obstruction) ([place])   1. Small bowel obstruction. Clinically has improved, positive stool in colostomy bag, NG tube will be removed and advance diet to clears per surgery recommendations. Will discontinue IV fluids. Continue pain control with as needed IV morphine and oral hydrocodone with acetaminophen.    2. Crohn's disease. Patient is sp bowel resection and colostomy bag. No significant abdominal pain, no diarrhea. No clinical signs of flare.   3. Prostate cancer. Positive metastasis to bony structures, will need outpatient follow up. Outpatient records personally reviewed, he follows with Dr. Alen Blew, last visit 07/15/18, currently  under active surveillance.   4. Iron deficiency. Continue Iron supplements.   5. Depression. Continue mirtazapine.   DVT prophylaxis: enoxaparin   Code Status:  full Family Communication:  No family at the bedside  Disposition Plan/ discharge barriers: possible dc in am if tolerates clears today.    Consultants:   Surgery   Procedures:     Antimicrobials:       Subjective: Patient is feeling better, but not back to baseline, continue to have abdominal pain, no nausea or vomiting. NG tube  In place. No dyspnea or chest pain.   Objective: Vitals:   09/30/18 1457 09/30/18 1731 09/30/18 2158 10/01/18 0438  BP: 110/78 105/61 102/61 101/70  Pulse: 65 68 67 66  Resp: 18 20 16 18   Temp: 98.5 F (36.9 C) 98.4 F (36.9 C) 98.9 F (37.2 C) 98.4 F (36.9 C)  TempSrc: Oral Oral Oral Oral  SpO2: 100% 100% 97% 98%  Weight: 81.3 kg     Height: 5' 4"  (1.626 m)       Intake/Output Summary (Last 24 hours) at 10/01/2018 0909 Last data filed at 10/01/2018 0844 Gross per 24 hour  Intake 849.31 ml  Output 700 ml  Net 149.31 ml   Filed Weights   09/30/18 0501 09/30/18 1457  Weight: 66.2 kg 81.3 kg    Examination:   General: deconditioned  Neurology: Awake and alert, non focal  E ENT: no pallor, no icterus, oral mucosa moist/ NG tube in place.  Cardiovascular: No JVD. S1-S2 present, rhythmic, no gallops, rubs, or murmurs. No lower extremity edema. Pulmonary: positive breath sounds bilaterally, adequate air movement, no wheezing, rhonchi or rales. Gastrointestinal. Abdomen mild distended, colostomy bag in place, (soft to liquid brown stool), abdominal hernia  on the left, tender to deep palpation, decreased bowel sounds. No organomegaly, no rebound or guarding Skin. No rashes Musculoskeletal: no joint deformities     Data Reviewed: I have personally reviewed following labs and imaging studies  CBC: Recent Labs  Lab 09/30/18 0521 10/01/18 0234  WBC 7.8 5.6  HGB  11.4* 11.0*  HCT 36.0* 34.3*  MCV 96.0 95.0  PLT 341 371   Basic Metabolic Panel: Recent Labs  Lab 09/30/18 0521 09/30/18 1507 10/01/18 0234  NA 137  --  139  K 3.8  --  3.7  CL 105  --  109  CO2 24  --  26  GLUCOSE 99  --  92  BUN 14  --  8  CREATININE 1.15  --  1.11  CALCIUM 8.5*  --  7.8*  MG  --  1.9  --   PHOS  --  2.9  --    GFR: Estimated Creatinine Clearance: 68.1 mL/min (by C-G formula based on SCr of 1.11 mg/dL). Liver Function Tests: Recent Labs  Lab 09/30/18 0521 10/01/18 0234  AST 24 20  ALT 12 12  ALKPHOS 38 42  BILITOT 0.9 0.7  PROT 7.1 6.1*  ALBUMIN 3.2* 2.8*   Recent Labs  Lab 09/30/18 0521  LIPASE 25   No results for input(s): AMMONIA in the last 168 hours. Coagulation Profile: No results for input(s): INR, PROTIME in the last 168 hours. Cardiac Enzymes: No results for input(s): CKTOTAL, CKMB, CKMBINDEX, TROPONINI in the last 168 hours. BNP (last 3 results) No results for input(s): PROBNP in the last 8760 hours. HbA1C: No results for input(s): HGBA1C in the last 72 hours. CBG: No results for input(s): GLUCAP in the last 168 hours. Lipid Profile: No results for input(s): CHOL, HDL, LDLCALC, TRIG, CHOLHDL, LDLDIRECT in the last 72 hours. Thyroid Function Tests: No results for input(s): TSH, T4TOTAL, FREET4, T3FREE, THYROIDAB in the last 72 hours. Anemia Panel: No results for input(s): VITAMINB12, FOLATE, FERRITIN, TIBC, IRON, RETICCTPCT in the last 72 hours.    Radiology Studies: I have reviewed all of the imaging during this hospital visit personally     Scheduled Meds: . budesonide  6 mg Oral Daily  . calcium carbonate  1 tablet Oral Q breakfast  . enoxaparin (LOVENOX) injection  40 mg Subcutaneous Q24H  . ferrous sulfate  325 mg Oral Q breakfast  . mouth rinse  15 mL Mouth Rinse BID  . mirtazapine  15 mg Oral QHS  . multivitamin with minerals  1 tablet Oral Daily   Continuous Infusions: . dextrose 5 % and 0.45% NaCl 75  mL/hr at 10/01/18 0505     LOS: 0 days        Mauricio Gerome Apley, MD [facility] Pager [phone]

## 2018-10-01 NOTE — Progress Notes (Signed)
Subjective/Chief Complaint: Having bowel function via stoma, no abd pain, hernia at baseline   Objective: Vital signs in last 24 hours: Temp:  [98.4 F (36.9 C)-98.9 F (37.2 C)] 98.4 F (36.9 C) (10/11 0438) Pulse Rate:  [65-74] 66 (10/11 0438) Resp:  [14-20] 18 (10/11 0438) BP: (101-112)/(61-78) 101/70 (10/11 0438) SpO2:  [97 %-100 %] 98 % (10/11 0438) Weight:  [81.3 kg] 81.3 kg (10/10 1457) Last BM Date: 09/30/18  Intake/Output from previous day: 10/10 0701 - 10/11 0700 In: 849.3 [P.O.:50; I.V.:799.3] Out: 700 [Urine:550; Emesis/NG output:150] Intake/Output this shift: No intake/output data recorded.  GI: soft hernia nontender stool and air in bag bs present  Lab Results:  Recent Labs    09/30/18 0521 10/01/18 0234  WBC 7.8 5.6  HGB 11.4* 11.0*  HCT 36.0* 34.3*  PLT 341 283   BMET Recent Labs    09/30/18 0521 10/01/18 0234  NA 137 139  K 3.8 3.7  CL 105 109  CO2 24 26  GLUCOSE 99 92  BUN 14 8  CREATININE 1.15 1.11  CALCIUM 8.5* 7.8*   PT/INR No results for input(s): LABPROT, INR in the last 72 hours. ABG No results for input(s): PHART, HCO3 in the last 72 hours.  Invalid input(s): PCO2, PO2  Studies/Results: Dg Abd 1 View  Result Date: 09/30/2018 CLINICAL DATA:  NG tube placement. EXAM: ABDOMEN - 1 VIEW COMPARISON:  CT, 09/30/2018 at 9:08 a.m. FINDINGS: Distended small bowel is noted in the central upper abdomen. Nasal/orogastric tube passes below the diaphragm, tip in the proximal stomach. Side hole lies at the level of the distal esophagus just above the GE junction. Bowel is evident in the left mid to lower abdomen consistent with the bowel containing hernia noted on the current CT. Hernia mesh is noted in the right lower quadrant. IMPRESSION: Nasal/orogastric tube tip lies in the proximal stomach. Side hole lies just above the GE junction. Consider further insertion, 5-10 cm, to allow the side hole to into the stomach. Electronically Signed    By: Lajean Manes M.D.   On: 09/30/2018 15:24   Ct Abdomen Pelvis W Contrast  Result Date: 09/30/2018 CLINICAL DATA:  Generalized abdominal pain with diarrhea for 3 days, history appendectomy, colectomy, irritable bowel syndrome, small-bowel obstruction, prostate cancer, duodenal ulcer, Crohn's disease, spigelian hernia EXAM: CT ABDOMEN AND PELVIS WITH CONTRAST TECHNIQUE: Multidetector CT imaging of the abdomen and pelvis was performed using the standard protocol following bolus administration of intravenous contrast. Sagittal and coronal MPR images reconstructed from axial data set. CONTRAST:  6m OMNIPAQUE IOHEXOL 300 MG/ML SOLN IV. No oral contrast. COMPARISON:  07/03/2018 FINDINGS: Lower chest: Dependent bibasilar atelectasis Hepatobiliary: Tiny dependent gallstones in gallbladder. Liver unremarkable. No biliary dilatation. Pancreas: Pancreatic ductal dilatation common duct up to 6 mm diameter, previously 3-4 mm. No pancreatic mass is definitely visualized. Distal CBD and distal pancreatic duct at the pancreatic head showed no definite evidence of mass or calcification. Spleen: Normal appearance Adrenals/Urinary Tract: Adrenal glands normal appearance. Kidneys and ureters normal appearance. Dependent calculi within urinary bladder., Stomach/Bowel: Extension of multiple small bowel loops into a large LEFT spigelian hernia. One of the small bowel loops within the hernia is dilated, question point of obstruction. Prior RIGHT colonic resection. Decompressed colon. Ostomy in the RIGHT lower quadrant. Dilated small bowel loops in mid abdomen/pelvis up to 3.5 cm diameter in the LEFT pelvis and 5.0 cm diameter in central pelvis. Component of small bowel obstruction is present likely due to adhesions. Associated  mild wall thickening of small bowel loops in the RIGHT abdomen, with mild surrounding infiltrative changes suggesting an inflammatory process, likely due to Crohn's disease. Vascular/Lymphatic: Multiple  normal sized mesenteric lymph nodes in RIGHT mid abdomen. Scattered LEFT para-aortic nodes, including a 14 mm LEFT para-aortic node image 30. Retroaortic LEFT renal vein. Remaining vascular structures grossly patent. Reproductive: Small prostate gland with abnormal soft tissue extending from prostate and seminal vesicles into the RIGHT hemipelvis, could represent tumor or sequela of prior surgery, lobulated appearance, overall measuring 6.4 x 3.1 cm, little changed. A Other: Small LEFT inguinal hernia containing fat. LEFT spigelian hernia containing bowel as previously noted. No free air or free fluid. Musculoskeletal: Sclerotic foci within pelvis question metastatic lesions from prostate cancer. Ankylosis of BILATERAL SI joints. IMPRESSION: Osseous metastases of the pelvis. Large LEFT spigelian hernia containing multiple small bowel loops including a dilated small bowel loop, question source of observed small-bowel obstruction versus related to adhesions in the pelvis. Prior RIGHT hemicolectomy. Bowel wall thickening of RIGHT lower quadrant and RIGHT mid abdomen small bowel loops, could reflect active Crohn's disease. Progressive pancreatic ductal dilatation since 07/03/2018, duct now 6 mm diameter, though no definite obstructing mass or stone is evident; consider follow-up MR imaging. Nonspecific enlarged LEFT para-aortic node new since previous study, could reflect metastatic disease in this patient with a history of prostate cancer. Stable abnormal soft tissue at the RIGHT inferior pelvis question related to prostate cancer versus prior therapy. Cholelithiasis Bladder calculi. Electronically Signed   By: Lavonia Dana M.D.   On: 09/30/2018 09:59    Anti-infectives: Anti-infectives (From admission, onward)   None      Assessment/Plan: SBO -this has resolved quickly, dc ng and have clears today -can be seen as outpatient at some point if desires repair  Rolm Bookbinder 10/01/2018

## 2018-10-02 DIAGNOSIS — K56609 Unspecified intestinal obstruction, unspecified as to partial versus complete obstruction: Secondary | ICD-10-CM | POA: Diagnosis not present

## 2018-10-02 DIAGNOSIS — K436 Other and unspecified ventral hernia with obstruction, without gangrene: Secondary | ICD-10-CM | POA: Diagnosis not present

## 2018-10-02 DIAGNOSIS — D509 Iron deficiency anemia, unspecified: Secondary | ICD-10-CM | POA: Diagnosis not present

## 2018-10-02 DIAGNOSIS — K50919 Crohn's disease, unspecified, with unspecified complications: Secondary | ICD-10-CM | POA: Diagnosis not present

## 2018-10-02 DIAGNOSIS — C61 Malignant neoplasm of prostate: Secondary | ICD-10-CM | POA: Diagnosis not present

## 2018-10-02 DIAGNOSIS — K5669 Other partial intestinal obstruction: Secondary | ICD-10-CM | POA: Diagnosis not present

## 2018-10-02 DIAGNOSIS — K432 Incisional hernia without obstruction or gangrene: Secondary | ICD-10-CM | POA: Diagnosis not present

## 2018-10-02 LAB — BASIC METABOLIC PANEL
ANION GAP: 5 (ref 5–15)
BUN: 6 mg/dL (ref 6–20)
CO2: 22 mmol/L (ref 22–32)
Calcium: 8.2 mg/dL — ABNORMAL LOW (ref 8.9–10.3)
Chloride: 110 mmol/L (ref 98–111)
Creatinine, Ser: 1.15 mg/dL (ref 0.61–1.24)
GLUCOSE: 99 mg/dL (ref 70–99)
POTASSIUM: 4.8 mmol/L (ref 3.5–5.1)
Sodium: 137 mmol/L (ref 135–145)

## 2018-10-02 NOTE — Progress Notes (Signed)
Pt discharged home with family

## 2018-10-02 NOTE — Discharge Summary (Signed)
Physician Discharge Summary  Daniel Howard UQJ:[id number] DOB: February 27, 1956 DOA: 09/30/2018  PCP: Alycia Rossetti, MD  Admit date: 09/30/2018 Discharge date: 10/02/2018  Admitted From: Home  Disposition:  Home   Recommendations for Outpatient Follow-up and new medication changes:  1. Follow up with Dr. [name] in 7 days.  2. Follow with Dr. Alen Blew as scheduled   Home Health: No  Equipment/Devices: No   Discharge Condition: stable  CODE STATUS: full  Diet recommendation: Regular   Brief/Interim Summary: 62 year old male who presented with abdominal pain.  He does have the significant past medical history for Crohn's disease, duodenal ulcers, metastatic prostate cancer and prior history of small bowel obstruction.  Patient reported 2 days of abdominal pain, colicky in nature, mainly in the central abdomen, associated with nausea and vomiting.  On his initial physical examination blood pressure 102/63, heart rate 68, respiratory rate 16, oxygen saturation 99%, lungs clear to auscultation bilaterally, heart S1-S2 present rhythmic, abdomen was soft and nontender, positive abdominal hernia on the left, ostomy bag in place, no lower extremity edema.  Sodium 137, potassium 3.8, chloride 105, bicarb 24, glucose 99, BUN 14, creatinine 1.15, AST 24, ALT 12, white count 7.8, hemoglobin 9.4, hematocrit 36.0, platelets 341, UA negative for infection.  CT of the abdomen showed large left spigelian hernia containing multiple small bowel loops, consistent with small bowel obstruction.  Multiple osseous metastatic lesions.  Progressive pancreatic duct dilatation.  Cholelithiasis and bladder calculi.  Patient was admitted with working diagnosis of small bowel obstruction.  1.  Small bowel obstruction related to spigelian hernia (ventral hernia)/ and or adhesions.  Patient was admitted to the medical ward, he was kept nothing by mouth, a nasogastric tube was placed and connected to low intermittent suction,  for decompression.  He received IV fluids and IV analgesics.  With conservative medical therapy his symptoms improved, his nasogastric tube was removed and he was allowed to eat clear liquid diet with no major complications.  His diet was advanced progressively with good toleration.  Patient was seen by surgery, with recommendations for outpatient follow-up if patient desires hernia repair.   2.  Crohn's disease.  Patient status post colectomy, no signs of acute flare, follow-up as an outpatient.  3.  Prostate cancer.  Positive bony metastasis, patient will continue follow-up with Dr. Alen Blew as an outpatient.  4.  Iron deficiency.  Continue iron supplementation.  5.  Depression.  Continue mirtazapine.  Discharge Diagnoses:  Active Problems:   SBO (small bowel obstruction) (HCC)    Discharge Instructions   Allergies as of 10/02/2018      Reactions   Penicillins Hives   Has patient had a PCN reaction causing immediate rash, facial/tongue/throat swelling, SOB or lightheadedness with hypotension: Yes Has patient had a PCN reaction causing severe rash involving mucus membranes or skin necrosis: No Has patient had a PCN reaction that required hospitalization: No Has patient had a PCN reaction occurring within the last 10 years: No If all of the above answers are "NO", then may proceed with Cephalosporin use.      Medication List    STOP taking these medications   triamcinolone ointment 0.5 % Commonly known as:  KENALOG     TAKE these medications   Adalimumab 40 MG/0.8ML Pnkt Inject 1 Syringe into the skin once a week. What changed:  Another medication with the same name was removed. Continue taking this medication, and follow the directions you see here.   budesonide 3 MG 24  hr capsule Commonly known as:  ENTOCORT EC Take 6 mg by mouth daily.   calcium carbonate 600 MG Tabs tablet Commonly known as:  OS-CAL Take 1,200 mg by mouth daily with breakfast.   CENTRUM SILVER ADULT  50+ Tabs Take 1 tablet by mouth daily.   ferrous sulfate 325 (65 FE) MG tablet Take 325 mg by mouth daily with breakfast.   HYDROcodone-acetaminophen 5-325 MG tablet Commonly known as:  NORCO/VICODIN Take 1 tablet by mouth every 6 (six) hours as needed for moderate pain.   lidocaine-prilocaine cream Commonly known as:  EMLA Apply 1 application topically as needed (when accessing port).   mirtazapine 15 MG tablet Commonly known as:  REMERON TAKE 1 TABLET BY MOUTH AT BEDTIME       Allergies  Allergen Reactions  . Penicillins Hives    Has patient had a PCN reaction causing immediate rash, facial/tongue/throat swelling, SOB or lightheadedness with hypotension: Yes Has patient had a PCN reaction causing severe rash involving mucus membranes or skin necrosis: No Has patient had a PCN reaction that required hospitalization: No Has patient had a PCN reaction occurring within the last 10 years: No If all of the above answers are "NO", then may proceed with Cephalosporin use.     Consultations:  Surgery    Procedures/Studies: Dg Abd 1 View  Result Date: 09/30/2018 CLINICAL DATA:  NG tube placement. EXAM: ABDOMEN - 1 VIEW COMPARISON:  CT, 09/30/2018 at 9:08 a.m. FINDINGS: Distended small bowel is noted in the central upper abdomen. Nasal/orogastric tube passes below the diaphragm, tip in the proximal stomach. Side hole lies at the level of the distal esophagus just above the GE junction. Bowel is evident in the left mid to lower abdomen consistent with the bowel containing hernia noted on the current CT. Hernia mesh is noted in the right lower quadrant. IMPRESSION: Nasal/orogastric tube tip lies in the proximal stomach. Side hole lies just above the GE junction. Consider further insertion, 5-10 cm, to allow the side hole to into the stomach. Electronically Signed   By: Lajean Manes M.D.   On: 09/30/2018 15:24   Ct Abdomen Pelvis W Contrast  Result Date: 09/30/2018 CLINICAL DATA:   Generalized abdominal pain with diarrhea for 3 days, history appendectomy, colectomy, irritable bowel syndrome, small-bowel obstruction, prostate cancer, duodenal ulcer, Crohn's disease, spigelian hernia EXAM: CT ABDOMEN AND PELVIS WITH CONTRAST TECHNIQUE: Multidetector CT imaging of the abdomen and pelvis was performed using the standard protocol following bolus administration of intravenous contrast. Sagittal and coronal MPR images reconstructed from axial data set. CONTRAST:  15m OMNIPAQUE IOHEXOL 300 MG/ML SOLN IV. No oral contrast. COMPARISON:  07/03/2018 FINDINGS: Lower chest: Dependent bibasilar atelectasis Hepatobiliary: Tiny dependent gallstones in gallbladder. Liver unremarkable. No biliary dilatation. Pancreas: Pancreatic ductal dilatation common duct up to 6 mm diameter, previously 3-4 mm. No pancreatic mass is definitely visualized. Distal CBD and distal pancreatic duct at the pancreatic head showed no definite evidence of mass or calcification. Spleen: Normal appearance Adrenals/Urinary Tract: Adrenal glands normal appearance. Kidneys and ureters normal appearance. Dependent calculi within urinary bladder., Stomach/Bowel: Extension of multiple small bowel loops into a large LEFT spigelian hernia. One of the small bowel loops within the hernia is dilated, question point of obstruction. Prior RIGHT colonic resection. Decompressed colon. Ostomy in the RIGHT lower quadrant. Dilated small bowel loops in mid abdomen/pelvis up to 3.5 cm diameter in the LEFT pelvis and 5.0 cm diameter in central pelvis. Component of small bowel obstruction is present likely  due to adhesions. Associated mild wall thickening of small bowel loops in the RIGHT abdomen, with mild surrounding infiltrative changes suggesting an inflammatory process, likely due to Crohn's disease. Vascular/Lymphatic: Multiple normal sized mesenteric lymph nodes in RIGHT mid abdomen. Scattered LEFT para-aortic nodes, including a 14 mm LEFT para-aortic  node image 30. Retroaortic LEFT renal vein. Remaining vascular structures grossly patent. Reproductive: Small prostate gland with abnormal soft tissue extending from prostate and seminal vesicles into the RIGHT hemipelvis, could represent tumor or sequela of prior surgery, lobulated appearance, overall measuring 6.4 x 3.1 cm, little changed. A Other: Small LEFT inguinal hernia containing fat. LEFT spigelian hernia containing bowel as previously noted. No free air or free fluid. Musculoskeletal: Sclerotic foci within pelvis question metastatic lesions from prostate cancer. Ankylosis of BILATERAL SI joints. IMPRESSION: Osseous metastases of the pelvis. Large LEFT spigelian hernia containing multiple small bowel loops including a dilated small bowel loop, question source of observed small-bowel obstruction versus related to adhesions in the pelvis. Prior RIGHT hemicolectomy. Bowel wall thickening of RIGHT lower quadrant and RIGHT mid abdomen small bowel loops, could reflect active Crohn's disease. Progressive pancreatic ductal dilatation since 07/03/2018, duct now 6 mm diameter, though no definite obstructing mass or stone is evident; consider follow-up MR imaging. Nonspecific enlarged LEFT para-aortic node new since previous study, could reflect metastatic disease in this patient with a history of prostate cancer. Stable abnormal soft tissue at the RIGHT inferior pelvis question related to prostate cancer versus prior therapy. Cholelithiasis Bladder calculi. Electronically Signed   By: Lavonia Dana M.D.   On: 09/30/2018 09:59   Dg Abd Portable 1v  Result Date: 10/01/2018 CLINICAL DATA:  Small bowel obstruction.hx bowel resection-ostomy on right-hernia on left EXAM: PORTABLE ABDOMEN - 1 VIEW COMPARISON:  09/30/2018 FINDINGS: Nasogastric tube is in place. There has been decompression of many of the dilated small bowel loops. Bowel gas is identified within the known LEFT LATERAL abdominal hernia. No evidence for free  intraperitoneal air. IMPRESSION: Interval decompression of small bowel loops.  LEFT LATERAL hernia. Electronically Signed   By: Nolon Nations M.D.   On: 10/01/2018 10:14       Subjective: Patient is feeling well, positive normal stool in the ostomy bag. No nausea or vomiting, no chest pain or dyspnea, has been ambulating.   Discharge Exam: Vitals:   10/01/18 2223 10/02/18 0505  BP: 112/77 104/68  Pulse: 60 (!) 56  Resp: 16 16  Temp: 98.2 F (36.8 C) 98.1 F (36.7 C)  SpO2: 100% 100%   Vitals:   10/01/18 0438 10/01/18 1324 10/01/18 2223 10/02/18 0505  BP: 101/70 108/75 112/77 104/68  Pulse: 66 64 60 (!) 56  Resp: 18 17 16 16   Temp: 98.4 F (36.9 C) 98.4 F (36.9 C) 98.2 F (36.8 C) 98.1 F (36.7 C)  TempSrc: Oral Oral Oral Oral  SpO2: 98% 100% 100% 100%  Weight:      Height:        General: Not in pain or dyspnea  Neurology: Awake and alert, non focal  E ENT: no pallor, no icterus, oral mucosa moist Cardiovascular: No JVD. S1-S2 present, rhythmic, no gallops, rubs, or murmurs. No lower extremity edema. Pulmonary: vesicular breath sounds bilaterally, adequate air movement, no wheezing, rhonchi or rales. Gastrointestinal. Abdomen with no organomegaly, non tender, no rebound or guarding. Left sided hernia, with ostomy bag in place, green liquid content.  Skin. No rashes Musculoskeletal: no joint deformities   The results of significant diagnostics from this  hospitalization (including imaging, microbiology, ancillary and laboratory) are listed below for reference.     Microbiology: No results found for this or any previous visit (from the past 240 hour(s)).   Labs: BNP (last 3 results) Recent Labs    06/27/18 0532  BNP 38.7   Basic Metabolic Panel: Recent Labs  Lab 09/30/18 0521 09/30/18 1507 10/01/18 0234 10/02/18 0129  NA 137  --  139 137  K 3.8  --  3.7 4.8  CL 105  --  109 110  CO2 24  --  26 22  GLUCOSE 99  --  92 99  BUN 14  --  8 6   CREATININE 1.15  --  1.11 1.15  CALCIUM 8.5*  --  7.8* 8.2*  MG  --  1.9  --   --   PHOS  --  2.9  --   --    Liver Function Tests: Recent Labs  Lab 09/30/18 0521 10/01/18 0234  AST 24 20  ALT 12 12  ALKPHOS 38 42  BILITOT 0.9 0.7  PROT 7.1 6.1*  ALBUMIN 3.2* 2.8*   Recent Labs  Lab 09/30/18 0521  LIPASE 25   No results for input(s): AMMONIA in the last 168 hours. CBC: Recent Labs  Lab 09/30/18 0521 10/01/18 0234  WBC 7.8 5.6  HGB 11.4* 11.0*  HCT 36.0* 34.3*  MCV 96.0 95.0  PLT 341 283   Cardiac Enzymes: No results for input(s): CKTOTAL, CKMB, CKMBINDEX, TROPONINI in the last 168 hours. BNP: Invalid input(s): POCBNP CBG: No results for input(s): GLUCAP in the last 168 hours. D-Dimer No results for input(s): DDIMER in the last 72 hours. Hgb A1c No results for input(s): HGBA1C in the last 72 hours. Lipid Profile No results for input(s): CHOL, HDL, LDLCALC, TRIG, CHOLHDL, LDLDIRECT in the last 72 hours. Thyroid function studies No results for input(s): TSH, T4TOTAL, T3FREE, THYROIDAB in the last 72 hours.  Invalid input(s): FREET3 Anemia work up No results for input(s): VITAMINB12, FOLATE, FERRITIN, TIBC, IRON, RETICCTPCT in the last 72 hours. Urinalysis    Component Value Date/Time   COLORURINE YELLOW 09/30/2018 [place] 09/30/2018 0517   LABSPEC 1.027 09/30/2018 0517   PHURINE 5.0 09/30/2018 [place] 09/30/2018 0517   HGBUR NEGATIVE 09/30/2018 [place] 09/30/2018 [place] 09/30/2018 0517   PROTEINUR NEGATIVE 09/30/2018 0517   UROBILINOGEN 0.2 10/10/2014 1037   NITRITE NEGATIVE 09/30/2018 0517   LEUKOCYTESUR NEGATIVE 09/30/2018 0517   Sepsis Labs Invalid input(s): PROCALCITONIN,  WBC,  LACTICIDVEN Microbiology No results found for this or any previous visit (from the past 240 hour(s)).   Time coordinating discharge: 45 minutes  SIGNED:   Tawni Millers,  MD  [facility] 10/02/2018, 8:06 AM Pager [phone]  If 7PM-7AM, please contact night-coverage [email or web address] Password [email or web address]

## 2018-10-02 NOTE — Progress Notes (Signed)
Patient ID: Daniel Howard, male   DOB: 10/01/1956, 62 y.o.   MRN: [id number] [facility] Surgery Progress Note:   * No surgery found *  Subjective: Mental status is clear;  Talking on the phone Objective: Vital signs in last 24 hours: Temp:  [98.1 F (36.7 C)-98.4 F (36.9 C)] 98.1 F (36.7 C) (10/12 0505) Pulse Rate:  [56-64] 56 (10/12 0505) Resp:  [16-17] 16 (10/12 0505) BP: (104-112)/(68-77) 104/68 (10/12 0505) SpO2:  [100 %] 100 % (10/12 0505)  Intake/Output from previous day: 10/11 0701 - 10/12 0700 In: 1390 [P.O.:1390] Out: 750 [Urine:750] Intake/Output this shift: No intake/output data recorded.  Physical Exam: Work of breathing is not labored;  No complaints  Lab Results:  Results for orders placed or performed during the hospital encounter of 09/30/18 (from the past 48 hour(s))  Magnesium     Status: None   Collection Time: 09/30/18  3:07 PM  Result Value Ref Range   Magnesium 1.9 1.7 - 2.4 mg/dL    Comment: Performed at [facility], [place] [address] [place], [place] [address]  Phosphorus     Status: None   Collection Time: 09/30/18  3:07 PM  Result Value Ref Range   Phosphorus 2.9 2.5 - 4.6 mg/dL    Comment: Performed at [facility] [place] [address], [place], [place] [address]  CBC     Status: Abnormal   Collection Time: 10/01/18  2:34 AM  Result Value Ref Range   WBC 5.6 4.0 - 10.5 K/uL   RBC 3.61 (L) 4.22 - 5.81 MIL/uL   Hemoglobin 11.0 (L) 13.0 - 17.0 g/dL   HCT 34.3 (L) 39.0 - 52.0 %   MCV 95.0 80.0 - 100.0 fL   MCH 30.5 26.0 - 34.0 pg   MCHC 32.1 30.0 - 36.0 g/dL   RDW 13.2 11.5 - 15.5 %   Platelets 283 150 - 400 K/uL   nRBC 0.0 0.0 - 0.2 %    Comment: Performed at [facility], [place] [address] [place], [place] [address]  Comprehensive metabolic panel     Status: Abnormal   Collection Time: 10/01/18  2:34 AM  Result Value Ref Range   Sodium 139 135 - 145 mmol/L   Potassium 3.7 3.5 - 5.1 mmol/L   Chloride 109 98 - 111 mmol/L    CO2 26 22 - 32 mmol/L   Glucose, Bld 92 70 - 99 mg/dL   BUN 8 6 - 20 mg/dL   Creatinine, Ser 1.11 0.61 - 1.24 mg/dL   Calcium 7.8 (L) 8.9 - 10.3 mg/dL   Total Protein 6.1 (L) 6.5 - 8.1 g/dL   Albumin 2.8 (L) 3.5 - 5.0 g/dL   AST 20 15 - 41 U/L   ALT 12 0 - 44 U/L   Alkaline Phosphatase 42 38 - 126 U/L   Total Bilirubin 0.7 0.3 - 1.2 mg/dL   GFR calc non Af Amer >60 >60 mL/min   GFR calc Af Amer >60 >60 mL/min    Comment: (NOTE) The eGFR has been calculated using the CKD EPI equation. This calculation has not been validated in all clinical situations. eGFR's persistently <60 mL/min signify possible Chronic Kidney Disease.    Anion gap 4 (L) 5 - 15    Comment: Performed at [facility] [address] [place], [place] [address]  Basic metabolic panel     Status: Abnormal   Collection Time: 10/02/18  1:29 AM  Result  Value Ref Range   Sodium 137 135 - 145 mmol/L   Potassium 4.8 3.5 - 5.1 mmol/L    Comment: SPECIMEN HEMOLYZED. HEMOLYSIS MAY AFFECT INTEGRITY OF RESULTS.   Chloride 110 98 - 111 mmol/L   CO2 22 22 - 32 mmol/L   Glucose, Bld 99 70 - 99 mg/dL   BUN 6 6 - 20 mg/dL   Creatinine, Ser 1.15 0.61 - 1.24 mg/dL   Calcium 8.2 (L) 8.9 - 10.3 mg/dL   GFR calc non Af Amer >60 >60 mL/min   GFR calc Af Amer >60 >60 mL/min    Comment: (NOTE) The eGFR has been calculated using the CKD EPI equation. This calculation has not been validated in all clinical situations. eGFR's persistently <60 mL/min signify possible Chronic Kidney Disease.    Anion gap 5 5 - 15    Comment: Performed at [facility] [address], [place], [place] [address]    Radiology/Results: Dg Abd 1 View  Result Date: 09/30/2018 CLINICAL DATA:  NG tube placement. EXAM: ABDOMEN - 1 VIEW COMPARISON:  CT, 09/30/2018 at 9:08 a.m. FINDINGS: Distended small bowel is noted in the central upper abdomen. Nasal/orogastric tube passes below the diaphragm, tip in the proximal stomach. Side hole lies at  the level of the distal esophagus just above the GE junction. Bowel is evident in the left mid to lower abdomen consistent with the bowel containing hernia noted on the current CT. Hernia mesh is noted in the right lower quadrant. IMPRESSION: Nasal/orogastric tube tip lies in the proximal stomach. Side hole lies just above the GE junction. Consider further insertion, 5-10 cm, to allow the side hole to into the stomach. Electronically Signed   By: Lajean Manes M.D.   On: 09/30/2018 15:24   Ct Abdomen Pelvis W Contrast  Result Date: 09/30/2018 CLINICAL DATA:  Generalized abdominal pain with diarrhea for 3 days, history appendectomy, colectomy, irritable bowel syndrome, small-bowel obstruction, prostate cancer, duodenal ulcer, Crohn's disease, spigelian hernia EXAM: CT ABDOMEN AND PELVIS WITH CONTRAST TECHNIQUE: Multidetector CT imaging of the abdomen and pelvis was performed using the standard protocol following bolus administration of intravenous contrast. Sagittal and coronal MPR images reconstructed from axial data set. CONTRAST:  104m OMNIPAQUE IOHEXOL 300 MG/ML SOLN IV. No oral contrast. COMPARISON:  07/03/2018 FINDINGS: Lower chest: Dependent bibasilar atelectasis Hepatobiliary: Tiny dependent gallstones in gallbladder. Liver unremarkable. No biliary dilatation. Pancreas: Pancreatic ductal dilatation common duct up to 6 mm diameter, previously 3-4 mm. No pancreatic mass is definitely visualized. Distal CBD and distal pancreatic duct at the pancreatic head showed no definite evidence of mass or calcification. Spleen: Normal appearance Adrenals/Urinary Tract: Adrenal glands normal appearance. Kidneys and ureters normal appearance. Dependent calculi within urinary bladder., Stomach/Bowel: Extension of multiple small bowel loops into a large LEFT spigelian hernia. One of the small bowel loops within the hernia is dilated, question point of obstruction. Prior RIGHT colonic resection. Decompressed colon. Ostomy  in the RIGHT lower quadrant. Dilated small bowel loops in mid abdomen/pelvis up to 3.5 cm diameter in the LEFT pelvis and 5.0 cm diameter in central pelvis. Component of small bowel obstruction is present likely due to adhesions. Associated mild wall thickening of small bowel loops in the RIGHT abdomen, with mild surrounding infiltrative changes suggesting an inflammatory process, likely due to Crohn's disease. Vascular/Lymphatic: Multiple normal sized mesenteric lymph nodes in RIGHT mid abdomen. Scattered LEFT para-aortic nodes, including a 14 mm LEFT para-aortic node image 30. Retroaortic LEFT renal vein. Remaining vascular  structures grossly patent. Reproductive: Small prostate gland with abnormal soft tissue extending from prostate and seminal vesicles into the RIGHT hemipelvis, could represent tumor or sequela of prior surgery, lobulated appearance, overall measuring 6.4 x 3.1 cm, little changed. A Other: Small LEFT inguinal hernia containing fat. LEFT spigelian hernia containing bowel as previously noted. No free air or free fluid. Musculoskeletal: Sclerotic foci within pelvis question metastatic lesions from prostate cancer. Ankylosis of BILATERAL SI joints. IMPRESSION: Osseous metastases of the pelvis. Large LEFT spigelian hernia containing multiple small bowel loops including a dilated small bowel loop, question source of observed small-bowel obstruction versus related to adhesions in the pelvis. Prior RIGHT hemicolectomy. Bowel wall thickening of RIGHT lower quadrant and RIGHT mid abdomen small bowel loops, could reflect active Crohn's disease. Progressive pancreatic ductal dilatation since 07/03/2018, duct now 6 mm diameter, though no definite obstructing mass or stone is evident; consider follow-up MR imaging. Nonspecific enlarged LEFT para-aortic node new since previous study, could reflect metastatic disease in this patient with a history of prostate cancer. Stable abnormal soft tissue at the RIGHT  inferior pelvis question related to prostate cancer versus prior therapy. Cholelithiasis Bladder calculi. Electronically Signed   By: Lavonia Dana M.D.   On: 09/30/2018 09:59   Dg Abd Portable 1v  Result Date: 10/01/2018 CLINICAL DATA:  Small bowel obstruction.hx bowel resection-ostomy on right-hernia on left EXAM: PORTABLE ABDOMEN - 1 VIEW COMPARISON:  09/30/2018 FINDINGS: Nasogastric tube is in place. There has been decompression of many of the dilated small bowel loops. Bowel gas is identified within the known LEFT LATERAL abdominal hernia. No evidence for free intraperitoneal air. IMPRESSION: Interval decompression of small bowel loops.  LEFT LATERAL hernia. Electronically Signed   By: Nolon Nations M.D.   On: 10/01/2018 10:14    Anti-infectives: Anti-infectives (From admission, onward)   None      Assessment/Plan: Problem List: Patient Active Problem List   Diagnosis Date Noted  . SBO (small bowel obstruction) ([place]) 09/30/2018  . Iron deficiency anemia 07/03/2018  . 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 Nausea without vomiting 11/10/2017  . Incisional hernia 09/22/2017  . Abdominal pain of multiple sites 09/22/2017  . Goals of care, counseling/discussion 04/02/2017  . Prostate cancer ([place]) 04/02/2017  . Elevated PSA 03/04/2017  . Osteopenia 11/03/2016  . Pelvic mass in male 09/04/2016  . 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 Exacerbation of Crohn's disease ([place]) 09/04/2016  . Protein-calorie malnutrition, severe ([place]) 07/27/2014  . Loss of weight 06/09/2014  . Crohn's disease of both small and large intestine with complication ([place]) 50/02/7047  . Boils 02/11/2013  . Ventral hernia 12/11/2009  . Anemia 12/03/2009  . Regional enteritis/Crohn's 01/25/2007    About to eat his tray and go home.  CCS will see PRN.   * No surgery found *    LOS: 0 days   Matt B. Hassell [place], MD, [facility] [phone] beeper [phone]  10/02/2018 9:05 AM

## 2018-10-05 ENCOUNTER — Ambulatory Visit: Payer: Medicare Other | Admitting: Internal Medicine

## 2018-10-08 ENCOUNTER — Ambulatory Visit: Payer: Medicare Other | Admitting: Internal Medicine

## 2018-10-08 DIAGNOSIS — C7951 Secondary malignant neoplasm of bone: Secondary | ICD-10-CM | POA: Diagnosis not present

## 2018-10-21 ENCOUNTER — Inpatient Hospital Stay: Payer: Medicare Other

## 2018-10-21 ENCOUNTER — Inpatient Hospital Stay: Payer: Medicare Other | Attending: Oncology

## 2018-10-21 DIAGNOSIS — Z452 Encounter for adjustment and management of vascular access device: Secondary | ICD-10-CM | POA: Insufficient documentation

## 2018-10-21 DIAGNOSIS — C61 Malignant neoplasm of prostate: Secondary | ICD-10-CM

## 2018-10-21 LAB — CMP (CANCER CENTER ONLY)
ALK PHOS: 49 U/L (ref 38–126)
ALT: 31 U/L (ref 0–44)
ANION GAP: 10 (ref 5–15)
AST: 21 U/L (ref 15–41)
Albumin: 2.9 g/dL — ABNORMAL LOW (ref 3.5–5.0)
BUN: 14 mg/dL (ref 6–20)
CALCIUM: 9 mg/dL (ref 8.9–10.3)
CO2: 27 mmol/L (ref 22–32)
CREATININE: 1.02 mg/dL (ref 0.61–1.24)
Chloride: 107 mmol/L (ref 98–111)
Glucose, Bld: 94 mg/dL (ref 70–99)
Potassium: 3.2 mmol/L — ABNORMAL LOW (ref 3.5–5.1)
SODIUM: 144 mmol/L (ref 135–145)
Total Bilirubin: 0.5 mg/dL (ref 0.3–1.2)
Total Protein: 7 g/dL (ref 6.5–8.1)

## 2018-10-21 LAB — CBC WITH DIFFERENTIAL (CANCER CENTER ONLY)
ABS IMMATURE GRANULOCYTES: 0.03 10*3/uL (ref 0.00–0.07)
BASOS PCT: 0 %
Basophils Absolute: 0 10*3/uL (ref 0.0–0.1)
EOS PCT: 2 %
Eosinophils Absolute: 0.2 10*3/uL (ref 0.0–0.5)
HCT: 31.5 % — ABNORMAL LOW (ref 39.0–52.0)
HEMOGLOBIN: 10.1 g/dL — AB (ref 13.0–17.0)
Immature Granulocytes: 0 %
Lymphocytes Relative: 32 %
Lymphs Abs: 3.2 10*3/uL (ref 0.7–4.0)
MCH: 31.3 pg (ref 26.0–34.0)
MCHC: 32.1 g/dL (ref 30.0–36.0)
MCV: 97.5 fL (ref 80.0–100.0)
MONO ABS: 0.6 10*3/uL (ref 0.1–1.0)
MONOS PCT: 6 %
NEUTROS ABS: 6.1 10*3/uL (ref 1.7–7.7)
Neutrophils Relative %: 60 %
PLATELETS: 282 10*3/uL (ref 150–400)
RBC: 3.23 MIL/uL — AB (ref 4.22–5.81)
RDW: 14.5 % (ref 11.5–15.5)
WBC: 10.1 10*3/uL (ref 4.0–10.5)
nRBC: 0 % (ref 0.0–0.2)

## 2018-10-21 MED ORDER — SODIUM CHLORIDE 0.9% FLUSH
10.0000 mL | INTRAVENOUS | Status: DC | PRN
  Administered 2018-10-21: 10 mL
  Filled 2018-10-21: qty 10

## 2018-10-21 MED ORDER — HEPARIN SOD (PORK) LOCK FLUSH 100 UNIT/ML IV SOLN
500.0000 [IU] | Freq: Once | INTRAVENOUS | Status: DC | PRN
  Filled 2018-10-21: qty 5

## 2018-10-22 ENCOUNTER — Telehealth: Payer: Self-pay | Admitting: Pharmacist

## 2018-10-22 ENCOUNTER — Inpatient Hospital Stay: Payer: Medicare Other | Attending: Oncology | Admitting: Oncology

## 2018-10-22 ENCOUNTER — Telehealth: Payer: Self-pay | Admitting: Oncology

## 2018-10-22 ENCOUNTER — Telehealth: Payer: Self-pay

## 2018-10-22 VITALS — BP 124/80 | HR 82 | Temp 98.1°F | Resp 18 | Ht 64.0 in | Wt 146.2 lb

## 2018-10-22 DIAGNOSIS — C7951 Secondary malignant neoplasm of bone: Secondary | ICD-10-CM | POA: Insufficient documentation

## 2018-10-22 DIAGNOSIS — I10 Essential (primary) hypertension: Secondary | ICD-10-CM | POA: Insufficient documentation

## 2018-10-22 DIAGNOSIS — C61 Malignant neoplasm of prostate: Secondary | ICD-10-CM

## 2018-10-22 DIAGNOSIS — Z79899 Other long term (current) drug therapy: Secondary | ICD-10-CM | POA: Diagnosis not present

## 2018-10-22 DIAGNOSIS — K56609 Unspecified intestinal obstruction, unspecified as to partial versus complete obstruction: Secondary | ICD-10-CM | POA: Diagnosis not present

## 2018-10-22 LAB — PROSTATE-SPECIFIC AG, SERUM (LABCORP): PROSTATE SPECIFIC AG, SERUM: 6.4 ng/mL — AB (ref 0.0–4.0)

## 2018-10-22 MED ORDER — ENZALUTAMIDE 40 MG PO CAPS: 160.0000 mg | ORAL_CAPSULE | Freq: Every day | ORAL | 0 refills | Status: DC

## 2018-10-22 MED FILL — XTANDI 40 MG CAPSULE: 40 | 30 days supply | Qty: 120 | Fill #0

## 2018-10-22 NOTE — Telephone Encounter (Signed)
Oral Oncology Pharmacist Encounter  Received new prescription for Xtandi (enzalutamide) for the treatment of metastatic, castration resistant prostate cancer in conjunction with androgen deprivation therapy, planned duration until disease progression or unacceptable toxicity.  Labs from 10/21/2018 assessed, okay for treatment. BPs in epic reviewed, readings WNL, will continue to be monitored  Current medication list in Epic reviewed, DDIs with [name] identified:  Xtandi and Remeron and Norco: Category D interactions: [name] is an inducer of CYP 3 A4, leading to possible increased metabolism and decrease systemic exposure to both patient's Norco and Remeron.  Patient will be counseled about decrease in efficacy of both agents.  No change to current therapy is indicated at this time.  Prescription has been e-scribed to the [facility] for benefits analysis and approval.  Oral Oncology Clinic will continue to follow for insurance authorization, copayment issues, initial counseling and start date.  Johny Drilling, PharmD, BCPS, BCOP  10/22/2018 9:47 AM Oral Oncology Clinic [phone]

## 2018-10-22 NOTE — Telephone Encounter (Signed)
Oral Oncology Patient Advocate Encounter  Confirmed with [place] that [name] was picked up on 10/22/18 with a $0 copay.   [place] Patient [place] Phone [phone] Fax [phone]

## 2018-10-22 NOTE — Telephone Encounter (Signed)
Appts scheduled avs/calendar printed per 11/1 los

## 2018-10-22 NOTE — Telephone Encounter (Signed)
Oral Oncology Patient Advocate Encounter  Prior Authorization for Daniel Howard has been approved.    PA# [id number] Effective dates: 10/22/18 through 12/22/19  Oral Oncology Clinic will continue to follow.   [place] Patient Daniel Howard Phone [phone] Fax [phone]

## 2018-10-22 NOTE — Telephone Encounter (Signed)
Oral Chemotherapy Pharmacist Encounter   I spoke with patient, and family members, in scheduling area for overview of: Xtandi (enzalutamide) for the treatment of metastatic, castration resistant prostate cancer in conjunction with androgen deprivation therapy, planned duration until disease progression or unacceptable toxicity.  Counseled patient on administration, dosing, side effects, monitoring, drug-food interactions, safe handling, storage, and disposal.  Patient will take Xtandi 75m capsules, 4 capsules (1624m by mouth once daily without regard to food.  Xtandi start date: TBD, pending medication acquisition  Adverse effects include but are not limited to: peripheral edema, GI upset, hypertension, hot flashes, fatigue, falls/fractures, and arthralgias.   Patient instructed about small risk of seizures with Xtandi treatment.  Reviewed with patient importance of keeping a medication schedule and plan for any missed doses.  Mr. WiCountermanoiced understanding and appreciation.   All questions answered. Medication reconciliation performed and medication/allergy list updated.  Discussed with patient medication interactions associated with concurrent use of Xtandi with Norco and Remeron. Patient will let usKoreanow if he is experiencing a decrease in efficacy in either of these 2 medications.  We will follow-up with the patient about insurance and pharmacy once insurance authorization is obtained and copayment is known.  Patient knows to call the office with questions or concerns.  Oral Oncology Clinic will continue to follow.  JeJohny DrillingPharmD, BCPS, BCOP  10/22/2018   9:51 AM Oral Oncology Clinic [phone]

## 2018-10-22 NOTE — Progress Notes (Signed)
Hematology and Oncology Follow Up Visit  Daniel Howard [id number] 07/30/1956 62 y.o. 10/22/2018 9:23 AM Daniel Howard, Daniel Howard, [name], Daniel Nunnery, MD   Principle Diagnosis: 75-year man with advanced prostate cancer diagnosed in March 2018.  He was found to have castration-sensitive disease with PSA of 31, pelvic adenopathy and bone disease.  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  Prior Therapy:   He is status post biopsy on 03/16/2017 of a pelvic mass which confirmed the presence of prostate cancer.  Taxotere chemotherapy at 75 mg/m started on 04/24/2017. He S/P 6 cycles of therapy completed in August 2018.  Current therapy:  Androgen deprivation therapy ongoing at [facility].  Under consideration to start salvage therapy.  Interim History: Daniel Howard returns today for a repeat evaluation.  Since the last visit, he was hospitalized briefly for a small bowel obstruction between October 10 and October 02, 2018.  He had presented with abdominal pain and imaging studies did not show any acute pathology but did show slight enlarging pelvic adenopathy.  Since his discharge, he reports his abdominal pain has resolved and has resumed most activities of daily living.  He is eating well and maintaining his weight.  He denies any bone pain or pathological fractures.  His performance status and quality of life is unchanged.   He does not report any headaches, blurry vision, syncope or seizures.  He denies any confusion or lethargy.[name]  He does not report any fevers, chills, sweats or weight loss. He does not report any chest pain, palpitation, orthopnea or leg edema. He does not report any cough, wheezing or hemoptysis. He does not report any diarrhea.  No hematochezia or melena.  He does not report any frequency urgency or hesitancy. He is not report any joint deformity or effusion.  He does not report any skin rashes or lesions.  He denies any anxiety or depression.  He  denies any ecchymosis or petechiae.  Remaining review of systems is negative  Medications: I have reviewed the patient's current medications.  Current Outpatient Medications  Medication Sig Dispense Refill  . Adalimumab (HUMIRA PEN) 40 MG/0.8ML PNKT Inject 1 Syringe into the skin once a week. 4 each 3  . budesonide (ENTOCORT EC) 3 MG 24 hr capsule Take 6 mg by mouth daily.    . calcium carbonate (OS-CAL) 600 MG TABS tablet Take 1,200 mg by mouth daily with breakfast.    . ferrous sulfate 325 (65 FE) MG tablet Take 325 mg by mouth daily with breakfast.    . HYDROcodone-acetaminophen (NORCO) 5-325 MG tablet Take 1 tablet by mouth every 6 (six) hours as needed for moderate pain. 60 tablet 0  . lidocaine-prilocaine (EMLA) cream Apply 1 application topically as needed (when accessing port).    . mirtazapine (REMERON) 15 MG tablet TAKE 1 TABLET BY MOUTH AT BEDTIME (Patient taking differently: Take 15 mg by mouth at bedtime. ) 90 tablet 1  . Multiple Vitamins-Minerals (CENTRUM SILVER ADULT 50+) TABS Take 1 tablet by mouth daily.     No current facility-administered medications for this visit.    Facility-Administered Medications Ordered in Other Visits  Medication Dose Route Frequency Provider Last Rate Last Dose  . sodium chloride flush (NS) 0.9 % injection 10 mL  10 mL Intravenous PRN Daniel Portela, MD   10 mL at 11/20/17 6256     Allergies:  Allergies  Allergen Reactions  . Penicillins Hives    Has patient had a PCN reaction causing  immediate rash, facial/tongue/throat swelling, SOB or lightheadedness with hypotension: Yes Has patient had a PCN reaction causing severe rash involving mucus membranes or skin necrosis: No Has patient had a PCN reaction that required hospitalization: No Has patient had a PCN reaction occurring within the last 10 years: No If all of the above answers are "NO", then may proceed with Cephalosporin use.     Past Medical History, Surgical history, Social  history, and Family History remained the same on review today.  Physical Exam:   ECOG: 1   General appearance: Comfortable appearing without any discomfort Head: Normocephalic without any trauma Oropharynx: Mucous membranes are moist and pink without any thrush or ulcers. Eyes: Pupils are equal and round reactive to light. Lymph nodes: No cervical, supraclavicular, inguinal or axillary lymphadenopathy.   Heart:regular rate and rhythm.  S1 and S2 without leg edema. Lung: Clear without any rhonchi or wheezes.  No dullness to percussion. Abdomin: Soft, nontender, nondistended with good bowel sounds.  No hepatosplenomegaly.  Colostomy in place without any erythema or induration. Musculoskeletal: No joint deformity or effusion.  Full range of motion noted. Neurological: No deficits noted on motor, sensory and deep tendon reflex exam. Skin: No petechial rash or dryness.  Appeared moist.     Lab Results: Lab Results  Component Value Date   WBC 10.1 10/21/2018   HGB 10.1 (L) 10/21/2018   HCT 31.5 (L) 10/21/2018   MCV 97.5 10/21/2018   PLT 282 10/21/2018     Chemistry      Component Value Date/Time   NA 144 10/21/2018 0927   NA 137 11/20/2017 0931   K 3.2 (L) 10/21/2018 0927   K 3.3 (L) 11/20/2017 0931   CL 107 10/21/2018 0927   CO2 27 10/21/2018 0927   CO2 27 11/20/2017 0931   BUN 14 10/21/2018 0927   BUN 16.6 11/20/2017 0931   CREATININE 1.02 10/21/2018 0927   CREATININE 0.99 12/02/2017 1249   CREATININE 1.2 11/20/2017 0931      Component Value Date/Time   CALCIUM 9.0 10/21/2018 0927   CALCIUM 8.9 11/20/2017 0931   ALKPHOS 49 10/21/2018 0927   ALKPHOS 62 11/20/2017 0931   AST 21 10/21/2018 0927   AST 38 (H) 11/20/2017 0931   ALT 31 10/21/2018 0927   ALT 45 11/20/2017 0931   BILITOT 0.5 10/21/2018 0927   BILITOT 0.66 11/20/2017 0931      [name] (MRN [id number]) as of 10/22/2018 08:21  Ref. Range 07/13/2018 12:16 10/21/2018 09:27  Prostate  Specific Ag, Serum Latest Ref Range: 0.0 - 4.0 ng/mL 0.5 6.4 (H)   EXAM: CT ABDOMEN AND PELVIS WITH CONTRAST  TECHNIQUE: Multidetector CT imaging of the abdomen and pelvis was performed using the standard protocol following bolus administration of intravenous contrast. Sagittal and coronal MPR images reconstructed from axial data set.  CONTRAST:  80m OMNIPAQUE IOHEXOL 300 MG/ML SOLN IV. No oral contrast.  COMPARISON:  07/03/2018  FINDINGS: Lower chest: Dependent bibasilar atelectasis  Hepatobiliary: Tiny dependent gallstones in gallbladder. Liver unremarkable. No biliary dilatation.  Pancreas: Pancreatic ductal dilatation common duct up to 6 mm diameter, previously 3-4 mm. No pancreatic mass is definitely visualized. Distal CBD and distal pancreatic duct at the pancreatic head showed no definite evidence of mass or calcification.  Spleen: Normal appearance  Adrenals/Urinary Tract: Adrenal glands normal appearance. Kidneys and ureters normal appearance. Dependent calculi within urinary bladder.,  Stomach/Bowel: Extension of multiple small bowel loops into a large LEFT spigelian hernia. One of  the small bowel loops within the hernia is dilated, question point of obstruction. Prior RIGHT colonic resection. Decompressed colon. Ostomy in the RIGHT lower quadrant. Dilated small bowel loops in mid abdomen/pelvis up to 3.5 cm diameter in the LEFT pelvis and 5.0 cm diameter in central pelvis. Component of small bowel obstruction is present likely due to adhesions. Associated mild wall thickening of small bowel loops in the RIGHT abdomen, with mild surrounding infiltrative changes suggesting an inflammatory process, likely due to Crohn's disease.  Vascular/Lymphatic: Multiple normal sized mesenteric lymph nodes in RIGHT mid abdomen. Scattered LEFT para-aortic nodes, including a 14 mm LEFT para-aortic node image 30. Retroaortic LEFT renal vein. Remaining vascular  structures grossly patent.  Reproductive: Small prostate gland with abnormal soft tissue extending from prostate and seminal vesicles into the RIGHT hemipelvis, could represent tumor or sequela of prior surgery, lobulated appearance, overall measuring 6.4 x 3.1 cm, little changed. A  Other: Small LEFT inguinal hernia containing fat. LEFT spigelian hernia containing bowel as previously noted. No free air or free fluid.  Musculoskeletal: Sclerotic foci within pelvis question metastatic lesions from prostate cancer. Ankylosis of BILATERAL SI joints.  IMPRESSION: Osseous metastases of the pelvis.  Large LEFT spigelian hernia containing multiple small bowel loops including a dilated small bowel loop, question source of observed small-bowel obstruction versus related to adhesions in the pelvis.  Prior RIGHT hemicolectomy.  Bowel wall thickening of RIGHT lower quadrant and RIGHT mid abdomen small bowel loops, could reflect active Crohn's disease.  Progressive pancreatic ductal dilatation since 07/03/2018, duct now 6 mm diameter, though no definite obstructing mass or stone is evident; consider follow-up MR imaging.  Nonspecific enlarged LEFT para-aortic node new since previous study, could reflect metastatic disease in this patient with a history of prostate cancer.  Stable abnormal soft tissue at the RIGHT inferior pelvis question related to prostate cancer versus prior therapy.  Cholelithiasis  Bladder calculi.  Impression and Plan:   62 year old man with:  1.  Advanced prostate cancer that is evolving into castration-resistant disease with pelvic adenopathy and bone disease.   His disease status was updated today including review of his CT scan obtained on 09/30/2018 as well as his PSA.  All these indicate that he has developed castration resistant disease with a rise in his PSA and radiographic progression.  These findings were discussed today in detail and  treatment options were reviewed.  These options would include retreatment with systemic chemotherapy utilizing Matthias Hughs, [place] along other options.  After discussion today, he have opted to proceed with oral hormonal therapy.  Risks and benefits of both [place] were reviewed today.  Complications include fatigue, tiredness, hypertension and rarely seizures associated with Xtandi.  I have elected to proceed with [facility] pending his drug interaction review.  Fabio Asa will be also an option if there is a significant drug interaction.  He is agreeable to proceed at this time.   2. IV access: Port-A-Cath will be flushed every 6 weeks.  No issues reported at this time.  3.  Crohn's disease: Recent small bowel obstruction noted but has resolved at this time.  4. Bone directed therapy: He continues to receive Xgeva in the care of [facility].  No issues reported at this time and I recommended continuing this indefinitely.  5. Androgen deprivation therapy: No issues reported with androgen deprivation.  He continues to receive that under care of at [facility].  6. Follow-up: Will be in 6 weeks to follow his progress.  25 minutes were spent face-to-face with the patient today.  More than 50% were spent on discussing his disease status, reviewing imaging studies and discussing treatment options and complications related to these therapies.    Zola Button, MD 11/1/20199:23 AM

## 2018-10-22 NOTE — Telephone Encounter (Signed)
Oral Oncology Patient Advocate Encounter  Received notification from [place] that prior authorization for [name] is required.  PA submitted on CoverMyMeds Key [id number] Status is pending  Oral Oncology Clinic will continue to follow.  [place] Patient [place] Phone [phone] Fax [phone]

## 2018-10-22 NOTE — Telephone Encounter (Signed)
Oral Oncology Patient Advocate Encounter  The [name] Prior Authorization was approved, a test claim revealed a $0 copay. I called the patient and spoke to his wife, we arranged pick up from [facility] today.   I will update this encounter when the medicine has been picked up from [facility].  [place] Patient [place] Phone [phone] Fax [phone]

## 2018-10-26 ENCOUNTER — Other Ambulatory Visit: Payer: Self-pay

## 2018-10-26 ENCOUNTER — Emergency Department (HOSPITAL_COMMUNITY): Payer: Medicare Other

## 2018-10-26 ENCOUNTER — Emergency Department (HOSPITAL_COMMUNITY)
Admission: EM | Admit: 2018-10-26 | Discharge: 2018-10-26 | Disposition: A | Discharge status: Home / Self Care | Payer: Medicare Other | Attending: Emergency Medicine | Admitting: Emergency Medicine

## 2018-10-26 ENCOUNTER — Encounter (HOSPITAL_COMMUNITY): Payer: Self-pay

## 2018-10-26 DIAGNOSIS — R0602 Shortness of breath: Secondary | ICD-10-CM | POA: Diagnosis not present

## 2018-10-26 DIAGNOSIS — Z79899 Other long term (current) drug therapy: Secondary | ICD-10-CM | POA: Diagnosis not present

## 2018-10-26 DIAGNOSIS — Z87891 Personal history of nicotine dependence: Secondary | ICD-10-CM | POA: Insufficient documentation

## 2018-10-26 DIAGNOSIS — R000 Tachycardia, unspecified: Secondary | ICD-10-CM | POA: Diagnosis not present

## 2018-10-26 LAB — BASIC METABOLIC PANEL
Anion gap: 7 (ref 5–15)
BUN: 10 mg/dL (ref 6–20)
CALCIUM: 8.6 mg/dL — AB (ref 8.9–10.3)
CO2: 23 mmol/L (ref 22–32)
CREATININE: 1.12 mg/dL (ref 0.61–1.24)
Chloride: 110 mmol/L (ref 98–111)
GFR calc Af Amer: >60 mL/min (ref >60)
GLUCOSE: 136 mg/dL — AB (ref 70–99)
Potassium: 3.5 mmol/L (ref 3.5–5.1)
Sodium: 140 mmol/L (ref 135–145)

## 2018-10-26 LAB — CBC
HCT: 35 % — ABNORMAL LOW (ref 39.0–52.0)
HEMOGLOBIN: 10.8 g/dL — AB (ref 13.0–17.0)
MCH: 30.9 pg (ref 26.0–34.0)
MCHC: 30.9 g/dL (ref 30.0–36.0)
MCV: 100 fL (ref 80.0–100.0)
PLATELETS: 296 10*3/uL (ref 150–400)
RBC: 3.5 MIL/uL — ABNORMAL LOW (ref 4.22–5.81)
RDW: 14.5 % (ref 11.5–15.5)
WBC: 9.1 10*3/uL (ref 4.0–10.5)
nRBC: 0 % (ref 0.0–0.2)

## 2018-10-26 LAB — I-STAT TROPONIN, ED: TROPONIN I, POC: 0 ng/mL (ref 0.00–0.08)

## 2018-10-26 NOTE — Discharge Instructions (Addendum)
Return for fever, productive cough, return of your shortness of breath, or if you have chest pain or pass out.

## 2018-10-26 NOTE — ED Provider Notes (Signed)
[place] EMERGENCY DEPARTMENT Provider Note   CSN: [id number] Arrival date & time: 10/26/18  1506     History   Chief Complaint Chief Complaint  Patient presents with  . Shortness of Breath    HPI Daniel Howard is a 62 y.o. male.  Patient with past medical history of prostate cancer with metastases to his bones, presents to the emergency department with a chief complaint of shortness of breath.  He states that when he is taking his medications today, he took them with a soda.  He states that his family members got on his case because of doing this.  He states that they got into an argument.  States that shortly thereafter he came short of breath.  He denies having had any chest pain or productive cough.  He states that he has no symptoms now.  States that his symptoms lasted for approximately 30 minutes.  The history is provided by the patient. No language interpreter was used.    Past Medical History:  Diagnosis Date  . Abnormal finding of biliary tract    MRCP shows pancreatic/biliary tract dilation. EUS 2010 confirmed dilation but no chronic pancreaitis or mass. Vascular ectasia crimpoing distal CBD.   [name] Anxiety   . 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 Duodenal ulcer 2010   nsaids  . History of blood transfusion 1992   "related to colon OR"  . Peristomal hernia   . Prostate cancer ([place]) 2018  . Small bowel obstruction ([place]) 10/2017; 11/21/2017; 02/16/2018  . Spigelian hernia    bilateral    Patient Active Problem List   Diagnosis Date Noted  . SBO (small bowel obstruction) ([place]) 09/30/2018  . Iron  deficiency anemia 07/03/2018  . 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 Nausea without vomiting 11/10/2017  . Incisional hernia 09/22/2017  . Abdominal pain of multiple sites 09/22/2017  . Goals of care, counseling/discussion 04/02/2017  . Prostate cancer ([place]) 04/02/2017  . Elevated PSA 03/04/2017  . Osteopenia 11/03/2016  . Pelvic mass in male 09/04/2016  . 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 Exacerbation of Crohn's disease ([place]) 09/04/2016  . Protein-calorie malnutrition, severe ([place]) 07/27/2014  . Loss of weight 06/09/2014  . Crohn's disease of both small and large intestine with complication ([place]) 15/17/6160  . Boils 02/11/2013  . Ventral hernia 12/11/2009  . Anemia 12/03/2009  . Regional enteritis/Crohn's 01/25/2007    Past Surgical History:  Procedure Laterality Date  . APPENDECTOMY  1992   at time of exp laparotomy at which time he was noted to have fistulizing Crohn's rather than UC  . COLON SURGERY    . COLONOSCOPY [name] 08/23/2014   VPX:TGGYIRS proctoscopy with possible fistulous opening in thebase of rectal/anal stump.    . COLOSTOMY  1992   transverse loop colostomy secondary to a stricture  . ESOPHAGOGASTRODUODENOSCOPY  05/2009   SLF: multiple antral erosions, large ulcer at ansatomosis (postsurgical changes at duodenal bulb and second portion of duodenum) BX c/x NSAIDS.  . EUS  10/04/2009   Dr. Estill Bakes dilated CBD and main pancreatic duct.  No pancreatic  . EXPLORATORY LAPAROTOMY  1992  . FLEXIBLE SIGMOIDOSCOPY  1988   Dr. Laural Golden- suggested  rohn's disease but the biopsies were not collaborative.  [name] FLEXIBLE SIGMOIDOSCOPY [name] 09/08/2016   Procedure: FLEXIBLE SIGMOIDOSCOPY;  Surgeon: Wonda Horner, MD;  Location: [facility] ENDOSCOPY;  Service: Gastroenterology;  Laterality: [name];  . HEMICOLOECTOMY W/ ANASTOMOSIS  1993   R- Dr.DeMason   . HERNIA REPAIR  1996   incarcerated periostial hernia with additional surgery in 1999  . IR FLUORO GUIDE PORT INSERTION RIGHT  04/03/2017  .  IR US GUIDE VASC ACCESS RIGHT  04/03/2017        Home Medications    Prior to Admission medications   Medication Sig Start Date End Date Taking? Authorizing Provider  Adalimumab (HUMIRA PEN) 40 MG/0.8ML PNKT Inject 1 Syringe into the skin once a week. 09/01/18  Yes Annitta Needs, NP  budesonide (ENTOCORT EC) 3 MG 24 hr capsule Take 6 mg by mouth daily.   Yes [provider]  calcium carbonate (OS-CAL) 600 MG TABS tablet Take 1,200 mg by mouth daily with breakfast.   Yes [provider]  enzalutamide (XTANDI) 40 MG capsule Take 4 capsules (160 mg total) by mouth daily. 10/22/18  Yes [name], MD  ferrous sulfate 325 (65 FE) MG tablet Take 325 mg by mouth daily with breakfast.   Yes [provider]  HYDROcodone-acetaminophen (NORCO) 5-325 MG tablet Take 1 tablet by mouth every 6 (six) hours as needed for moderate pain. 09/15/18  Yes Susy Frizzle, MD  lidocaine-prilocaine (EMLA) cream Apply 1 application topically as needed (when accessing port).   Yes [provider]  mirtazapine (REMERON) 15 MG tablet TAKE 1 TABLET BY MOUTH AT BEDTIME Patient taking differently: Take 15 mg by mouth at bedtime.  09/27/18  Yes [place], [name], MD  Multiple Vitamins-Minerals (CENTRUM SILVER ADULT 50+) TABS Take 1 tablet by mouth daily.   Yes [provider]    Family History Family History  Problem Relation Age of Onset  . Cancer Father        prostate   . Prostate cancer Father   . Colon cancer Father 68  . Hypertension Sister   . Cancer Sister   . Depression Sister   . Breast cancer Sister   . COPD Sister   . Aneurysm Brother        deceased, brain aneurysm    Social History Social History   Tobacco Use  . Smoking status: Former Smoker    Packs/day: 0.75    Years: 32.00    Pack years: 24.00    Types: Cigarettes    Last attempt to quit: 12/21/2009    Years since quitting: 8.8  . Smokeless tobacco: Never Used  Substance Use Topics  .  Alcohol use: No    Alcohol/week: 0.0 standard drinks    Comment: Former drinker  . Drug use: No     Allergies   Penicillins   Review of Systems Review of Systems  All other systems reviewed and are negative.    Physical Exam Updated Vital Signs BP 125/73   Pulse 77   Temp 98.2 F (36.8 C) (Oral)   Resp (!) 27   Ht 5' 4"  (1.626 m)   Wt 63.5 kg   SpO2 98%   BMI 24.03 kg/m   Physical Exam  Constitutional: He is oriented to person, place, and time. He appears well-developed and well-nourished.  HENT:  Head: Normocephalic and atraumatic.  Eyes: Pupils are equal, round, and reactive to light. Conjunctivae and EOM are normal. Right eye exhibits no  discharge. Left eye exhibits no discharge. No scleral icterus.  Neck: Normal range of motion. Neck supple. No JVD present.  Cardiovascular: Normal rate, regular rhythm and normal heart sounds. Exam reveals no gallop and no friction rub.  No murmur heard. Pulmonary/Chest: Effort normal and breath sounds normal. No respiratory distress. He has no wheezes. He has no rales. He exhibits no tenderness.  Abdominal: Soft. He exhibits no distension and no mass. There is no tenderness. There is no rebound and no guarding.  Musculoskeletal: Normal range of motion. He exhibits no edema or tenderness.  Neurological: He is alert and oriented to person, place, and time.  Skin: Skin is warm and dry.  Psychiatric: He has a normal mood and affect. His behavior is normal. Judgment and thought content normal.  Nursing note and vitals reviewed.    ED Treatments / Results  Labs (all labs ordered are listed, but only abnormal results are displayed) Labs Reviewed  BASIC METABOLIC PANEL - Abnormal; Notable for the following components:      Result Value   Glucose, Bld 136 (*)    Calcium 8.6 (*)    All other components within normal limits  CBC - Abnormal; Notable for the following components:   RBC 3.50 (*)    Hemoglobin 10.8 (*)    HCT 35.0 (*)     All other components within normal limits  I-STAT TROPONIN, ED    EKG EKG Interpretation  Date/Time:  Tuesday October 26 2018 15:12:36 EST Ventricular Rate:  105 PR Interval:  120 QRS Duration: 70 QT Interval:  328 QTC Calculation: 433 R Axis:   52 Text Interpretation:  Sinus tachycardia Possible Left atrial enlargement Borderline ECG Since last tracing rate faster Confirmed by Isla Pence [phone]) on 10/26/2018 9:55:01 PM   Radiology Dg Chest 2 View  Result Date: 10/26/2018 CLINICAL DATA:  Shortness of breath, dizziness and headache for 1 day. EXAM: CHEST - 2 VIEW COMPARISON:  PA and lateral chest 06/28/2018 and 12/11/2017. FINDINGS: Port-A-Cath is unchanged. Lungs are clear. Heart size is normal. There is no pneumothorax or pleural effusion. No acute or focal bony abnormality. IMPRESSION: No acute disease. Electronically Signed   By: Inge Rise M.D.   On: 10/26/2018 16:33    Procedures Procedures (including critical care time)  Medications Ordered in ED Medications - No data to display   Initial Impression / Assessment and Plan / ED Course  I have reviewed the triage vital signs and the nursing notes.  Pertinent labs & imaging results that were available during my care of the patient were reviewed by me and considered in my medical decision making (see chart for details).     Patient with shortness of breath following an argument with his family members.  Troponin is negative.  EKG shows no ischemic changes.  Patient is not hypoxic.  He has no complaints at this time.  He has been in the emergency department waiting room and treatment area for 8 hours.  He has had no return of symptoms.  Discussed with Dr. Christy Gentles, who agrees with DC.  Doubt PE, ACS, or infectious process.  Final Clinical Impressions(s) / ED Diagnoses   Final diagnoses:  SOB (shortness of breath)    ED Discharge Orders    None       Montine Circle, PA-C 10/26/18 2337      Ripley Fraise, MD 10/27/18 [phone]

## 2018-10-26 NOTE — ED Notes (Signed)
Pt. Asked about wait time. Stated he is going to have to leave in a few and that he will wait a little longer.

## 2018-10-26 NOTE — ED Provider Notes (Signed)
Patient placed in Quick Look pathway, seen and evaluated   Chief Complaint: shortness of breath  HPI: Daniel Howard is a 62 y.o. male who presents to the ED with c/o SOB, headache and dizziness. Patient denies n/v numbness or tingling or trouble walking. Patient states that he got real upset and not sure if the symptoms were caused by that or not.   ROS: Resp: shortness of breath  Neuro: dizziness  Physical Exam:  BP 126/83 (BP Location: Right Arm)   Pulse 99   Temp 99.7 F (37.6 C) (Oral)   Resp 18   Ht 5' 4"  (1.626 m)   Wt 63.5 kg   SpO2 99%   BMI 24.03 kg/m    Gen: No distress  Neuro: Awake and Alert  Skin: Warm and dry  Lungs without wheezing or rales  Heart: ? PVC  Initiation of care has begun. The patient has been counseled on the process, plan, and necessity for staying for the completion/evaluation, and the remainder of the medical screening examination    Ashley Murrain, NP 10/26/18 [place], Julie, MD 10/26/18 1652

## 2018-10-26 NOTE — ED Triage Notes (Signed)
Pt c/o SOB, HA, and dizziness for "about a day". Denies numbness tingling or trouble walking.

## 2018-10-27 ENCOUNTER — Other Ambulatory Visit: Payer: Self-pay | Admitting: Family Medicine

## 2018-10-27 NOTE — Telephone Encounter (Signed)
Ok to refill??  Last office visit 08/16/2018.  Last refill 09/15/2018.

## 2018-10-27 NOTE — Telephone Encounter (Signed)
Patient is calling to get refill on his hydrocodone  [facility]

## 2018-10-28 MED ORDER — HYDROCODONE-ACETAMINOPHEN 5-325 MG PO TABS: 1.0000 | ORAL_TABLET | Freq: Four times a day (QID) | ORAL | 0 refills | Status: DC | PRN

## 2018-11-01 ENCOUNTER — Observation Stay (HOSPITAL_COMMUNITY)
Admission: EM | Admit: 2018-11-01 | Discharge: 2018-11-03 | Disposition: A | Discharge status: Home / Self Care | Payer: Medicare Other | Attending: Internal Medicine | Admitting: Internal Medicine

## 2018-11-01 ENCOUNTER — Encounter (HOSPITAL_COMMUNITY): Payer: Self-pay | Admitting: Emergency Medicine

## 2018-11-01 ENCOUNTER — Emergency Department (HOSPITAL_COMMUNITY): Payer: Medicare Other

## 2018-11-01 DIAGNOSIS — R109 Unspecified abdominal pain: Secondary | ICD-10-CM | POA: Diagnosis present

## 2018-11-01 DIAGNOSIS — Z87891 Personal history of nicotine dependence: Secondary | ICD-10-CM | POA: Diagnosis not present

## 2018-11-01 DIAGNOSIS — C61 Malignant neoplasm of prostate: Secondary | ICD-10-CM | POA: Diagnosis present

## 2018-11-01 DIAGNOSIS — K56600 Partial intestinal obstruction, unspecified as to cause: Secondary | ICD-10-CM | POA: Diagnosis not present

## 2018-11-01 DIAGNOSIS — Z79899 Other long term (current) drug therapy: Secondary | ICD-10-CM | POA: Insufficient documentation

## 2018-11-01 DIAGNOSIS — Z8546 Personal history of malignant neoplasm of prostate: Secondary | ICD-10-CM | POA: Diagnosis not present

## 2018-11-01 DIAGNOSIS — R188 Other ascites: Secondary | ICD-10-CM | POA: Diagnosis not present

## 2018-11-01 DIAGNOSIS — R197 Diarrhea, unspecified: Secondary | ICD-10-CM | POA: Diagnosis not present

## 2018-11-01 DIAGNOSIS — K50912 Crohn's disease, unspecified, with intestinal obstruction: Secondary | ICD-10-CM

## 2018-11-01 DIAGNOSIS — D509 Iron deficiency anemia, unspecified: Secondary | ICD-10-CM | POA: Insufficient documentation

## 2018-11-01 DIAGNOSIS — C7951 Secondary malignant neoplasm of bone: Secondary | ICD-10-CM

## 2018-11-01 DIAGNOSIS — K430 Incisional hernia with obstruction, without gangrene: Secondary | ICD-10-CM | POA: Diagnosis not present

## 2018-11-01 DIAGNOSIS — K50819 Crohn's disease of both small and large intestine with unspecified complications: Secondary | ICD-10-CM | POA: Diagnosis not present

## 2018-11-01 DIAGNOSIS — K56609 Unspecified intestinal obstruction, unspecified as to partial versus complete obstruction: Secondary | ICD-10-CM | POA: Diagnosis not present

## 2018-11-01 DIAGNOSIS — K5090 Crohn's disease, unspecified, without complications: Secondary | ICD-10-CM | POA: Diagnosis not present

## 2018-11-01 DIAGNOSIS — Z933 Colostomy status: Secondary | ICD-10-CM

## 2018-11-01 DIAGNOSIS — R1084 Generalized abdominal pain: Secondary | ICD-10-CM | POA: Diagnosis not present

## 2018-11-01 LAB — COMPREHENSIVE METABOLIC PANEL
ALBUMIN: 3.3 g/dL — AB (ref 3.5–5.0)
ALK PHOS: 45 U/L (ref 38–126)
ALT: 17 U/L (ref 0–44)
AST: 27 U/L (ref 15–41)
Anion gap: 7 (ref 5–15)
BUN: 10 mg/dL (ref 6–20)
CO2: 23 mmol/L (ref 22–32)
CREATININE: 1.01 mg/dL (ref 0.61–1.24)
Calcium: 8.3 mg/dL — ABNORMAL LOW (ref 8.9–10.3)
Chloride: 107 mmol/L (ref 98–111)
GFR calc Af Amer: >60 mL/min (ref >60)
GFR calc non Af Amer: >60 mL/min (ref >60)
GLUCOSE: 85 mg/dL (ref 70–99)
Potassium: 3.9 mmol/L (ref 3.5–5.1)
SODIUM: 137 mmol/L (ref 135–145)
Total Bilirubin: 0.9 mg/dL (ref 0.3–1.2)
Total Protein: 7.4 g/dL (ref 6.5–8.1)

## 2018-11-01 LAB — CBC WITH DIFFERENTIAL/PLATELET
ABS IMMATURE GRANULOCYTES: 0.01 10*3/uL (ref 0.00–0.07)
BASOS ABS: 0 10*3/uL (ref 0.0–0.1)
Basophils Relative: 0 %
Eosinophils Absolute: 0.1 10*3/uL (ref 0.0–0.5)
Eosinophils Relative: 2 %
HCT: 39.5 % (ref 39.0–52.0)
HEMOGLOBIN: 12.2 g/dL — AB (ref 13.0–17.0)
IMMATURE GRANULOCYTES: 0 %
LYMPHS PCT: 35 %
Lymphs Abs: 2.6 10*3/uL (ref 0.7–4.0)
MCH: 30.9 pg (ref 26.0–34.0)
MCHC: 30.9 g/dL (ref 30.0–36.0)
MCV: 100 fL (ref 80.0–100.0)
Monocytes Absolute: 0.9 10*3/uL (ref 0.1–1.0)
Monocytes Relative: 11 %
NEUTROS ABS: 4 10*3/uL (ref 1.7–7.7)
NRBC: 0 % (ref 0.0–0.2)
Neutrophils Relative %: 52 %
Platelets: 268 10*3/uL (ref 150–400)
RBC: 3.95 MIL/uL — AB (ref 4.22–5.81)
RDW: 14.8 % (ref 11.5–15.5)
WBC: 7.6 10*3/uL (ref 4.0–10.5)

## 2018-11-01 LAB — URINALYSIS, ROUTINE W REFLEX MICROSCOPIC
Bilirubin Urine: NEGATIVE
GLUCOSE, UA: NEGATIVE mg/dL
HGB URINE DIPSTICK: NEGATIVE
KETONES UR: NEGATIVE mg/dL
LEUKOCYTES UA: NEGATIVE
Nitrite: NEGATIVE
PROTEIN: NEGATIVE mg/dL
Specific Gravity, Urine: 1.025 (ref 1.005–1.030)
pH: 5 (ref 5.0–8.0)

## 2018-11-01 LAB — LIPASE, BLOOD: LIPASE: 24 U/L (ref 11–51)

## 2018-11-01 LAB — SEDIMENTATION RATE: SED RATE: 40 mm/h — AB (ref 0–16)

## 2018-11-01 LAB — C-REACTIVE PROTEIN: CRP: 2 mg/dL — AB (ref <1.0)

## 2018-11-01 MED ORDER — ENOXAPARIN SODIUM 40 MG/0.4ML ~~LOC~~ SOLN
40.0000 mg | SUBCUTANEOUS | Status: DC
  Administered 2018-11-01: 40 mg via SUBCUTANEOUS
  Filled 2018-11-01 (×2): qty 0.4

## 2018-11-01 MED ORDER — FERROUS SULFATE 325 (65 FE) MG PO TABS
325.0000 mg | ORAL_TABLET | Freq: Every day | ORAL | Status: DC
  Administered 2018-11-02 – 2018-11-03 (×2): 325 mg via ORAL
  Filled 2018-11-01 (×2): qty 1

## 2018-11-01 MED ORDER — IOHEXOL 300 MG/ML  SOLN
100.0000 mL | Freq: Once | INTRAMUSCULAR | Status: AC | PRN
  Administered 2018-11-01: 100 mL via INTRAVENOUS

## 2018-11-01 MED ORDER — CALCIUM CARBONATE 1250 (500 CA) MG PO TABS
1.0000 | ORAL_TABLET | Freq: Every day | ORAL | Status: DC
  Administered 2018-11-02 – 2018-11-03 (×2): 500 mg via ORAL
  Filled 2018-11-01 (×2): qty 1

## 2018-11-01 MED ORDER — MIRTAZAPINE 15 MG PO TABS
15.0000 mg | ORAL_TABLET | Freq: Every day | ORAL | Status: DC
  Administered 2018-11-01 – 2018-11-02 (×2): 15 mg via ORAL
  Filled 2018-11-01 (×2): qty 1

## 2018-11-01 MED ORDER — ONDANSETRON HCL 4 MG/2ML IJ SOLN: 4.0000 mg | Freq: Four times a day (QID) | INTRAMUSCULAR | Status: DC | PRN

## 2018-11-01 MED ORDER — BUDESONIDE 3 MG PO CPEP
6.0000 mg | ORAL_CAPSULE | Freq: Every day | ORAL | Status: DC
  Administered 2018-11-01 – 2018-11-03 (×3): 6 mg via ORAL
  Filled 2018-11-01 (×3): qty 2

## 2018-11-01 MED ORDER — FENTANYL CITRATE (PF) 100 MCG/2ML IJ SOLN: 25.0000 ug | INTRAMUSCULAR | Status: DC | PRN

## 2018-11-01 MED ORDER — HYDROCODONE-ACETAMINOPHEN 5-325 MG PO TABS: 1.0000 | ORAL_TABLET | Freq: Four times a day (QID) | ORAL | Status: DC | PRN

## 2018-11-01 MED ORDER — ENZALUTAMIDE 40 MG PO CAPS
160.0000 mg | ORAL_CAPSULE | Freq: Every day | ORAL | Status: DC
  Administered 2018-11-03: 160 mg via ORAL
  Filled 2018-11-01 (×2): qty 4

## 2018-11-01 MED ORDER — MORPHINE SULFATE (PF) 4 MG/ML IV SOLN
4.0000 mg | INTRAVENOUS | Status: DC | PRN
  Administered 2018-11-01 – 2018-11-02 (×2): 4 mg via INTRAVENOUS
  Filled 2018-11-01 (×2): qty 1

## 2018-11-01 MED ORDER — ACETAMINOPHEN 650 MG RE SUPP: 650.0000 mg | Freq: Four times a day (QID) | RECTAL | Status: DC | PRN

## 2018-11-01 MED ORDER — SODIUM CHLORIDE 0.9 % IV BOLUS
1000.0000 mL | Freq: Once | INTRAVENOUS | Status: AC
  Administered 2018-11-01: 1000 mL via INTRAVENOUS

## 2018-11-01 MED ORDER — CALCIUM CARBONATE 600 MG PO TABS: 1200.0000 mg | ORAL_TABLET | Freq: Every day | ORAL | Status: DC

## 2018-11-01 MED ORDER — ADULT MULTIVITAMIN W/MINERALS CH
1.0000 | ORAL_TABLET | Freq: Every day | ORAL | Status: DC
  Administered 2018-11-01 – 2018-11-03 (×3): 1 via ORAL
  Filled 2018-11-01 (×3): qty 1

## 2018-11-01 MED ORDER — FENTANYL CITRATE (PF) 100 MCG/2ML IJ SOLN
25.0000 ug | Freq: Once | INTRAMUSCULAR | Status: AC
  Administered 2018-11-01: 25 ug via INTRAVENOUS
  Filled 2018-11-01: qty 2

## 2018-11-01 MED ORDER — SODIUM CHLORIDE 0.9 % IV SOLN
INTRAVENOUS | Status: DC
  Administered 2018-11-01 – 2018-11-02 (×3): via INTRAVENOUS

## 2018-11-01 MED ORDER — ACETAMINOPHEN 325 MG PO TABS: 650.0000 mg | ORAL_TABLET | Freq: Four times a day (QID) | ORAL | Status: DC | PRN

## 2018-11-01 MED ORDER — ONDANSETRON HCL 4 MG PO TABS: 4.0000 mg | ORAL_TABLET | Freq: Four times a day (QID) | ORAL | Status: DC | PRN

## 2018-11-01 MED ORDER — CENTRUM SILVER ADULT 50+ PO TABS: 1.0000 | ORAL_TABLET | Freq: Every day | ORAL | Status: DC

## 2018-11-01 NOTE — ED Triage Notes (Signed)
Pt to ER for evaluation of lower abdominal pain onset yesterday. Significant abdominal surgical history. Has ostomy. Reports diarrhea. History of multiple obstructions,

## 2018-11-01 NOTE — Consult Note (Signed)
Reason for Consult:abdominal pain Referring Physician: Janeece Fitting PA-C  Daniel Howard is an 62 y.o. male.  HPI: Patient is a 62 year old male who presents with abdominal pain that started after church yesterday. No n/v. Increased ostomy output.  No f/c. Took pain pill at home but didn't help. Came to ED today bc of persistent abd pain. "same thing as last time".  Multiple admissions this year to hospital for similar c/o . Patient describes pain as a cramping feeling along old midline scar, intermittent.  He denies other symptoms. He has a history of Crohn's disease and is followed by [facility], currently on humira weekly. Last Crohn's flare was around 3-4 months ago. He has had multiple abdominal surgeries in the past secondary to Crohn's stricture, all done by Dr. Anthony Sar in [place], [place]. Patient is also currently receiving chemotherapy for metastatic prostate cancer. He is allergic to penicillins. He denies drug, alcohol or tobacco use.   Past Medical History:  Diagnosis Date  . Abnormal finding of biliary tract    MRCP shows pancreatic/biliary tract dilation. EUS 2010 confirmed dilation but no chronic pancreaitis or mass. Vascular ectasia crimpoing distal CBD.   [name] Anxiety   . 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 Duodenal ulcer 2010   nsaids  . History of blood transfusion 1992   "related to colon OR"  . Peristomal hernia   . Prostate cancer ([place]) 2018  . Small bowel obstruction ([place]) 10/2017; 11/21/2017; 02/16/2018  . Spigelian hernia    bilateral    Past Surgical History:  Procedure Laterality Date  . APPENDECTOMY  1992   at time of exp  laparotomy at which time he was noted to have fistulizing Crohn's rather than UC  . COLON SURGERY    . COLONOSCOPY [name] 08/23/2014   NWG:NFAOZHY proctoscopy with possible fistulous opening in thebase of rectal/anal stump.    . COLOSTOMY  1992   transverse loop colostomy secondary to a stricture  . ESOPHAGOGASTRODUODENOSCOPY  05/2009   SLF: multiple antral erosions, large ulcer at ansatomosis (postsurgical changes at duodenal bulb and second portion of duodenum) BX c/x NSAIDS.  . EUS  10/04/2009   Dr. Estill Bakes dilated CBD and main pancreatic duct.  No pancreatic  . EXPLORATORY LAPAROTOMY  1992  . FLEXIBLE SIGMOIDOSCOPY  1988   Dr. Laural Golden- suggested rohn's disease but the biopsies were not collaborative.  [name] FLEXIBLE SIGMOIDOSCOPY [name] 09/08/2016   Procedure: FLEXIBLE SIGMOIDOSCOPY;  Surgeon: Wonda Horner, MD;  Location: [facility] ENDOSCOPY;  Service: Gastroenterology;  Laterality: [name];  . HEMICOLOECTOMY W/ ANASTOMOSIS  1993   R- Dr.DeMason   . HERNIA REPAIR  1996   incarcerated periostial hernia with additional surgery in 1999  . IR FLUORO GUIDE PORT INSERTION RIGHT  04/03/2017  . IR US GUIDE VASC ACCESS RIGHT  04/03/2017    Family History  Problem Relation Age of Onset  . Cancer Father        prostate   . Prostate cancer Father   . Colon cancer Father 102  . Hypertension Sister   . Cancer Sister   . Depression Sister   . Breast cancer Sister   . COPD Sister   . Aneurysm  Brother        deceased, brain aneurysm    Social History:  reports that he quit smoking about 8 years ago. His smoking use included cigarettes. He has a 24.00 pack-year smoking history. He has never used smokeless tobacco. He reports that he does not drink alcohol or use drugs.  Allergies:  Allergies  Allergen Reactions  . Penicillins Hives    Has patient had a PCN reaction causing immediate rash, facial/tongue/throat swelling, SOB or lightheadedness with hypotension: Yes Has patient had a PCN reaction causing  severe rash involving mucus membranes or skin necrosis: No Has patient had a PCN reaction that required hospitalization: No Has patient had a PCN reaction occurring within the last 10 years: No If all of the above answers are "NO", then may proceed with Cephalosporin use.     Medications: I have reviewed the patient's current medications.  Results for orders placed or performed during the hospital encounter of 11/01/18 (from the past 48 hour(s))  CBC with Differential     Status: Abnormal   Collection Time: 11/01/18 12:45 PM  Result Value Ref Range   WBC 7.6 4.0 - 10.5 K/uL   RBC 3.95 (L) 4.22 - 5.81 MIL/uL   Hemoglobin 12.2 (L) 13.0 - 17.0 g/dL   HCT 39.5 39.0 - 52.0 %   MCV 100.0 80.0 - 100.0 fL   MCH 30.9 26.0 - 34.0 pg   MCHC 30.9 30.0 - 36.0 g/dL   RDW 14.8 11.5 - 15.5 %   Platelets 268 150 - 400 K/uL    Comment: REPEATED TO VERIFY   nRBC 0.0 0.0 - 0.2 %   Neutrophils Relative % 52 %   Neutro Abs 4.0 1.7 - 7.7 K/uL   Lymphocytes Relative 35 %   Lymphs Abs 2.6 0.7 - 4.0 K/uL   Monocytes Relative 11 %   Monocytes Absolute 0.9 0.1 - 1.0 K/uL   Eosinophils Relative 2 %   Eosinophils Absolute 0.1 0.0 - 0.5 K/uL   Basophils Relative 0 %   Basophils Absolute 0.0 0.0 - 0.1 K/uL   Immature Granulocytes 0 %   Abs Immature Granulocytes 0.01 0.00 - 0.07 K/uL    Comment: Performed at [facility], [address], [place], [place] [address]  Comprehensive metabolic panel     Status: Abnormal   Collection Time: 11/01/18 12:45 PM  Result Value Ref Range   Sodium 137 135 - 145 mmol/L   Potassium 3.9 3.5 - 5.1 mmol/L   Chloride 107 98 - 111 mmol/L   CO2 23 22 - 32 mmol/L   Glucose, Bld 85 70 - 99 mg/dL   BUN 10 6 - 20 mg/dL   Creatinine, Ser 1.01 0.61 - 1.24 mg/dL   Calcium 8.3 (L) 8.9 - 10.3 mg/dL   Total Protein 7.4 6.5 - 8.1 g/dL   Albumin 3.3 (L) 3.5 - 5.0 g/dL   AST 27 15 - 41 U/L   ALT 17 0 - 44 U/L   Alkaline Phosphatase 45 38 - 126 U/L   Total Bilirubin 0.9 0.3 -  1.2 mg/dL   GFR calc non Af Amer >60 >60 mL/min   GFR calc Af Amer >60 >60 mL/min    Comment: (NOTE) The eGFR has been calculated using the CKD EPI equation. This calculation has not been validated in all clinical situations. eGFR's persistently <60 mL/min signify possible Chronic Kidney Disease.    Anion gap 7 5 - 15    Comment: Performed at  [facility], [place] [address], [place], [place] [address]  Lipase, blood     Status: None   Collection Time: 11/01/18 12:45 PM  Result Value Ref Range   Lipase 24 11 - 51 U/L    Comment: Performed at [facility], [place] [address], [place], [place] [address]  Urinalysis, Routine w reflex microscopic     Status: None   Collection Time: 11/01/18  1:03 PM  Result Value Ref Range   Color, Urine YELLOW YELLOW   APPearance CLEAR CLEAR   Specific Gravity, Urine 1.025 1.005 - 1.030   pH 5.0 5.0 - 8.0   Glucose, UA NEGATIVE NEGATIVE mg/dL   Hgb urine dipstick NEGATIVE NEGATIVE   Bilirubin Urine NEGATIVE NEGATIVE   Ketones, ur NEGATIVE NEGATIVE mg/dL   Protein, ur NEGATIVE NEGATIVE mg/dL   Nitrite NEGATIVE NEGATIVE   Leukocytes, UA NEGATIVE NEGATIVE    Comment: Performed at [facility] [address] [place], [place] [address]    Ct Abdomen Pelvis W Contrast  Result Date: 11/01/2018 CLINICAL DATA:  61 year old male with a history of midline abdominal pain for 1 day. Diarrhea. History of prior obstructions. History of Crohn disease. EXAM: CT ABDOMEN AND PELVIS WITH CONTRAST TECHNIQUE: Multidetector CT imaging of the abdomen and pelvis was performed using the standard protocol following bolus administration of intravenous contrast. CONTRAST:  122m OMNIPAQUE IOHEXOL 300 MG/ML  SOLN COMPARISON:  CT 09/30/2018 FINDINGS: Lower chest: No acute abnormality. Hepatobiliary: Unremarkable appearance of liver. Unremarkable gallbladder. Pancreas: Unremarkable pancreas parenchyma. Redemonstration of pancreatic duct dilation with no radiopaque  stones. Spleen: Unremarkable spleen Adrenals/Urinary Tract: Unremarkable adrenal glands. Unremarkable appearance of the kidneys with no hydronephrosis. No nephrolithiasis. Urinary bladder unchanged from the prior. High density material layered in the right aspect of the urinary bladder, contralateral from the prior CT likely representing mobile stones. Stomach/Bowel: Small hiatal hernia.  Unremarkable stomach. Similar configuration of small bowel to the comparison CT scan with multiple dilated small bowel loops and associated air-fluid levels. There are hyperenhancing and decompressed small bowel loops in the low abdomen, just superior to the ostomy. Associated increased vasculature of the mesenteric arterial system/venous system, best appreciated on the coronal reformatted images and compatible with a comb sign. There are at least 2 sites of persistent narrowing of small bowel when compared to the CT dated 09/30/2018, suggesting chronic strictures and may be related to the appearance of obstructed bowel. Stoma of the right lower quadrant. Similar appearance of the hernia sac of the left spigelian region containing multiple loops of small bowel. None of these are significantly dilated. Vascular/Lymphatic: No significant vascular calcifications. Multiple lymph nodes of the retroperitoneum are again visualized, most prominent on the left. Multiple enlarged lymph nodes of the right lower quadrant associated with the ileal colic mesentery. Reproductive: Mass of the right aspect of the pelvis, unchanged from prior measuring 2.7 cm x 5.5 cm on the current CT, relatively unchanged from prior. This is inseparable with the residual rectum in prostate. And is inseparable from the bladder base. Other: Surgical changes of the midline abdomen. Musculoskeletal: No acute displaced fracture. Redemonstration of sclerotic lesions of the bilateral iliac bones, pubic bone, bilateral ischial bones, right proximal femur. IMPRESSION:  Multiple dilated small bowel loops with air-fluid levels of the abdomen, suggesting at least partial small bowel obstruction. The etiology is favored to be a combination of both acute and chronic changes of inflammatory bowel disease in this patient with given history of Crohn's disease. There  are at least 2 focal short-segment regions of narrowing of small bowel in the right lower quadrant adjacent to the ostomy, which may represent chronic strictures, given these are relatively similar when compared to the recent prior CT. Hyperenhancing bowel of the right lower quadrant with a vascular "comb sign" suggesting active Crohn's disease. Redemonstration of left spigelian hernia with multiple small bowel loops. Redemonstration of lymphadenopathy of the ileal colic mesentery and of retroperitoneal region. Redemonstration of pelvic sclerotic metastases and soft tissue in the right pelvis inseparable from prostate, rectum, and bladder base in this patient with given history of prostate carcinoma. Redemonstration of pancreatic ductal dilatation without associated inflammation. Electronically Signed   By: Corrie Mckusick D.O.   On: 11/01/2018 18:36    Review of Systems  Constitutional: Negative for chills and fever.  Respiratory: Negative for shortness of breath.   Cardiovascular: Negative for chest pain.  Gastrointestinal: Positive for abdominal pain. Negative for constipation, nausea and vomiting.  All other systems reviewed and are negative.  Blood pressure 131/83, pulse 81, temperature 98.7 F (37.1 C), temperature source Oral, resp. rate 18, SpO2 100 %. Physical Exam  Vitals reviewed. Constitutional: He is oriented to person, place, and time. He appears well-developed and well-nourished. No distress.  Male older than stated age; nontoxic appearance.   HENT:  Head: Normocephalic and atraumatic.  Right Ear: External ear normal.  Left Ear: External ear normal.  Eyes: Conjunctivae are normal. No scleral  icterus.  Neck: Normal range of motion. Neck supple. No tracheal deviation present. No thyromegaly present.  Cardiovascular: Normal rate and normal heart sounds.  Respiratory: Effort normal and breath sounds normal. No stridor. No respiratory distress. He has no wheezes.  GI: Soft. He exhibits no distension. There is tenderness. There is no rigidity, no rebound and no guarding. A hernia is present. Hernia confirmed positive in the ventral area.    Multiple old surgical scars; prolapsed ileostomy in RLQ with large amount of liquid in ostomy bag. Incisional hernia with large amount of herniated SB to left of midline - soft, nt; some TTP mainly in upper midline  Musculoskeletal: He exhibits no edema or tenderness.  Lymphadenopathy:    He has no cervical adenopathy.  Neurological: He is alert and oriented to person, place, and time. He exhibits normal muscle tone.  Skin: Skin is warm and dry. No rash noted. He is not diaphoretic. No erythema. No pallor.  Psychiatric: He has a normal mood and affect. His behavior is normal. Judgment and thought content normal.    Assessment/Plan: Abdominal pain Crohn's disease on biologic therapy Met prostate cancer on chemo Multiple prior admissions for SBO Chronic incarcerated Incisional hernia  Similar presentation as previous times.  No surgical abdomen - soft, mild TTP. No fever. No wbc. No signs of strangulation on CT ?active flare on CT  Doesn't appear this psbo is due to incisional hernia Adhesions vs chronic stricture?  Admit to medicine service Bowel rest Will hold off on NG tube since no n/v and having ostomy output Consider GI consult to see if needs txt for possible active flare?  Leighton Ruff. Redmond Pulling, MD, FACS General, Bariatric, & Minimally Invasive Surgery [facility]   Jomari Bartnik 11/01/2018, 8:04 PM

## 2018-11-01 NOTE — ED Notes (Signed)
Surgery at bedside.

## 2018-11-01 NOTE — ED Provider Notes (Signed)
[facility] EMERGENCY DEPARTMENT Provider Note   CSN: [id number] Arrival date & time: 11/01/18  1231     History   Chief Complaint Chief Complaint  Patient presents with  . Abdominal Pain    HPI Daniel Howard is a 62 y.o. male.  62 y.o male with a PMH of Prostate CA, Small bowel obstruction, Chrons, duodenal ulcer presents to the ED with a chief complaint of abdominal pain since last night. Patient reports his pain began after church. He describes his pain as intermittent cramp feeling located on the lower portion of his abdomen. He tried taking hydrocodone but state no relieve in symptoms. He also reports several episodes of diarrhea x 4. Patient does have a colostomy bag in place and noted increase output in this. He denies any alleviating or aggrevating symptoms associated, fever, chest pain, shortness of breath or urinary complaints.      Past Medical History:  Diagnosis Date  . Abnormal finding of biliary tract    MRCP shows pancreatic/biliary tract dilation. EUS 2010 confirmed dilation but no chronic pancreaitis or mass. Vascular ectasia crimpoing distal CBD.   [name] Anxiety   . 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 Duodenal ulcer 2010   nsaids  . History of blood transfusion 1992   "related to colon OR"  . Peristomal hernia   . Prostate cancer ([place]) 2018  . Small bowel obstruction ([place]) 10/2017; 11/21/2017; 02/16/2018  . Spigelian hernia    bilateral    Patient Active Problem List   Diagnosis Date Noted  . SBO (small bowel obstruction) ([place]) 09/30/2018  . Iron deficiency anemia 07/03/2018  . 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 Nausea without vomiting 11/10/2017  . Incisional hernia 09/22/2017  . Abdominal pain of multiple sites 09/22/2017  . Goals of care, counseling/discussion 04/02/2017  . Prostate cancer ([place]) 04/02/2017  . Elevated PSA 03/04/2017  . Osteopenia 11/03/2016  . Pelvic mass in male 09/04/2016  . 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 Exacerbation of Crohn's disease ([place]) 09/04/2016  . Protein-calorie malnutrition, severe ([place]) 07/27/2014  . Loss of weight 06/09/2014  . Crohn's disease of both small and large intestine with complication ([place]) 57/26/2035  . Boils 02/11/2013  . Ventral hernia 12/11/2009  . Anemia 12/03/2009  . Regional enteritis/Crohn's 01/25/2007    Past Surgical History:  Procedure Laterality Date  . APPENDECTOMY  1992   at time of exp laparotomy at which time he was noted to have fistulizing Crohn's rather than UC  . COLON SURGERY    . COLONOSCOPY [name] 08/23/2014   D[facility]:CBULAGT proctoscopy with possible fistulous opening in thebase of rectal/anal stump.    . COLOSTOMY  1992   transverse loop colostomy secondary to a stricture  . ESOPHAGOGASTRODUODENOSCOPY  05/2009   SLF: multiple antral erosions, large ulcer at ansatomosis (postsurgical changes at duodenal bulb and second portion of duodenum) BX c/x NSAIDS.  . EUS  10/04/2009   Dr. Estill Bakes dilated CBD and main pancreatic duct.  No pancreatic  . EXPLORATORY LAPAROTOMY  1992  . FLEXIBLE SIGMOIDOSCOPY  1988   Dr. Laural Golden- suggested rohn's disease but the biopsies were not collaborative.  [name] FLEXIBLE SIGMOIDOSCOPY [name] 09/08/2016   Procedure:  FLEXIBLE SIGMOIDOSCOPY;  Surgeon: Wonda Horner, MD;  Location: [facility] ENDOSCOPY;  Service: Gastroenterology;  Laterality: [name];  . HEMICOLOECTOMY W/ ANASTOMOSIS  1993   R- Dr.DeMason   . HERNIA REPAIR  1996   incarcerated periostial hernia with additional surgery in 1999  . IR FLUORO GUIDE PORT INSERTION RIGHT  04/03/2017  . IR US GUIDE VASC ACCESS RIGHT  04/03/2017          Home Medications    Prior to Admission medications   Medication Sig Start Date End Date Taking? Authorizing Provider  Adalimumab (HUMIRA PEN) 40 MG/0.8ML PNKT Inject 1 Syringe into the skin once a week. Patient taking differently: Inject 1 Syringe into the skin every Wednesday.  09/01/18   Annitta Needs, NP  budesonide (ENTOCORT EC) 3 MG 24 hr capsule Take 6 mg by mouth daily.    [provider]  calcium carbonate (OS-CAL) 600 MG TABS tablet Take 1,200 mg by mouth daily with breakfast.    [provider]  enzalutamide (XTANDI) 40 MG capsule Take 4 capsules (160 mg total) by mouth daily. 10/22/18   [name], MD  ferrous sulfate 325 (65 FE) MG tablet Take 325 mg by mouth daily with breakfast.    [provider]  HYDROcodone-acetaminophen (NORCO) 5-325 MG tablet Take 1 tablet by mouth every 6 (six) hours as needed for moderate pain. 10/28/18   Susy Frizzle, MD  lidocaine-prilocaine (EMLA) cream Apply 1 application topically as needed (when accessing port).    [provider]  mirtazapine (REMERON) 15 MG tablet TAKE 1 TABLET BY MOUTH AT BEDTIME Patient taking differently: Take 15 mg by mouth at bedtime.  09/27/18   Reynolds, [name], MD  Multiple Vitamins-Minerals (CENTRUM SILVER ADULT 50+) TABS Take 1 tablet by mouth daily.    [provider]    Family History Family History  Problem Relation Age of Onset  . Cancer Father        prostate   . Prostate cancer Father   . Colon cancer Father 2  . Hypertension Sister   . Cancer Sister   . Depression Sister   . Breast cancer Sister   . COPD Sister   . Aneurysm Brother        deceased, brain aneurysm    Social History Social History   Tobacco Use  . Smoking status: Former Smoker    Packs/day: 0.75    Years: 32.00    Pack years: 24.00    Types: Cigarettes    Last attempt to quit: 12/21/2009    Years since quitting: 8.8  . Smokeless tobacco: Never Used  Substance Use  Topics  . Alcohol use: No    Alcohol/week: 0.0 standard drinks    Comment: Former drinker  . Drug use: No     Allergies   Penicillins   Review of Systems Review of Systems  Constitutional: Positive for fever.  HENT: Negative for sore throat.   Respiratory: Negative for shortness of breath.   Cardiovascular: Negative for chest pain.  Gastrointestinal: Positive for abdominal pain and diarrhea. Negative for blood in stool, constipation, nausea and vomiting.  Genitourinary: Negative for flank pain.  Musculoskeletal: Negative for back pain.  Skin: Negative for pallor and wound.  Neurological: Negative for light-headedness and headaches.     Physical Exam Updated Vital Signs BP 131/83   Pulse 81   Temp 98.7 F (37.1 C) (Oral)   Resp 18   SpO2 100%   Physical Exam  Constitutional: He is oriented to person, place, and time. He appears well-developed and well-nourished.  HENT:  Head: Normocephalic and atraumatic.  Mouth/Throat: Oropharynx is clear and moist.  Eyes: Pupils are equal, round, and reactive to light. No scleral icterus.  Neck: Normal range of motion.  Cardiovascular: Normal heart sounds.  Pulmonary/Chest: Effort normal and breath sounds normal. He has no wheezes. He exhibits no tenderness.  Abdominal: Soft. Bowel sounds are normal. He exhibits no distension. There is no tenderness. There is no rigidity, no rebound, no guarding, no CVA tenderness, no tenderness at McBurney's point and negative Murphy's sign.  Musculoskeletal: He exhibits no tenderness or deformity.  Neurological: He is alert and oriented to person, place, and time.  Skin: Skin is warm and dry.  Nursing note and vitals reviewed.    ED Treatments / Results  Labs (all labs ordered are listed, but only abnormal results are displayed) Labs Reviewed  CBC WITH DIFFERENTIAL/PLATELET - Abnormal; Notable for the following components:      Result Value   RBC 3.95 (*)    Hemoglobin 12.2 (*)    All  other components within normal limits  COMPREHENSIVE METABOLIC PANEL - Abnormal; Notable for the following components:   Calcium 8.3 (*)    Albumin 3.3 (*)    All other components within normal limits  LIPASE, BLOOD  URINALYSIS, ROUTINE W REFLEX MICROSCOPIC    EKG None  Radiology Ct Abdomen Pelvis W Contrast  Result Date: 11/01/2018 CLINICAL DATA:  62 year old male with a history of midline abdominal pain for 1 day. Diarrhea. History of prior obstructions. History of Crohn disease. EXAM: CT ABDOMEN AND PELVIS WITH CONTRAST TECHNIQUE: Multidetector CT imaging of the abdomen and pelvis was performed using the standard protocol following bolus administration of intravenous contrast. CONTRAST:  121m OMNIPAQUE IOHEXOL 300 MG/ML  SOLN COMPARISON:  CT 09/30/2018 FINDINGS: Lower chest: No acute abnormality. Hepatobiliary: Unremarkable appearance of liver. Unremarkable gallbladder. Pancreas: Unremarkable pancreas parenchyma. Redemonstration of pancreatic duct dilation with no radiopaque stones. Spleen: Unremarkable spleen Adrenals/Urinary Tract: Unremarkable adrenal glands. Unremarkable appearance of the kidneys with no hydronephrosis. No nephrolithiasis. Urinary bladder unchanged from the prior. High density material layered in the right aspect of the urinary bladder, contralateral from the prior CT likely representing mobile stones. Stomach/Bowel: Small hiatal hernia.  Unremarkable stomach. Similar configuration of small bowel to the comparison CT scan with multiple dilated small bowel loops and associated air-fluid levels. There are hyperenhancing and decompressed small bowel loops in the low abdomen, just superior to the ostomy. Associated increased vasculature of the mesenteric arterial system/venous system, best appreciated on the coronal reformatted images and compatible with a comb sign. There are at least 2 sites of persistent narrowing of small bowel when compared to the CT dated 09/30/2018,  suggesting chronic strictures and may be related to the appearance of obstructed bowel. Stoma of the right lower quadrant. Similar appearance of the hernia sac of the left spigelian region containing multiple loops of small bowel. None of these are significantly dilated. Vascular/Lymphatic: No significant vascular calcifications. Multiple lymph nodes of the retroperitoneum are again visualized, most prominent on the left. Multiple enlarged lymph nodes of the right lower quadrant associated with the ileal colic mesentery. Reproductive: Mass of the right aspect of the pelvis, unchanged from prior measuring 2.7 cm x 5.5 cm on the current CT, relatively unchanged from prior. This is inseparable with the residual rectum in prostate. And is inseparable from the bladder base. Other: Surgical changes of the midline abdomen.  Musculoskeletal: No acute displaced fracture. Redemonstration of sclerotic lesions of the bilateral iliac bones, pubic bone, bilateral ischial bones, right proximal femur. IMPRESSION: Multiple dilated small bowel loops with air-fluid levels of the abdomen, suggesting at least partial small bowel obstruction. The etiology is favored to be a combination of both acute and chronic changes of inflammatory bowel disease in this patient with given history of Crohn's disease. There are at least 2 focal short-segment regions of narrowing of small bowel in the right lower quadrant adjacent to the ostomy, which may represent chronic strictures, given these are relatively similar when compared to the recent prior CT. Hyperenhancing bowel of the right lower quadrant with a vascular "comb sign" suggesting active Crohn's disease. Redemonstration of left spigelian hernia with multiple small bowel loops. Redemonstration of lymphadenopathy of the ileal colic mesentery and of retroperitoneal region. Redemonstration of pelvic sclerotic metastases and soft tissue in the right pelvis inseparable from prostate, rectum, and  bladder base in this patient with given history of prostate carcinoma. Redemonstration of pancreatic ductal dilatation without associated inflammation. Electronically Signed   By: Corrie Mckusick D.O.   On: 11/01/2018 18:36    Procedures Procedures (including critical care time)  Medications Ordered in ED Medications  sodium chloride 0.9 % bolus 1,000 mL (0 mLs Intravenous Stopped 11/01/18 1723)  fentaNYL (SUBLIMAZE) injection 25 mcg (25 mcg Intravenous Given 11/01/18 1610)  iohexol (OMNIPAQUE) 300 MG/ML solution 100 mL (100 mLs Intravenous Contrast Given 11/01/18 1753)  fentaNYL (SUBLIMAZE) injection 25 mcg (25 mcg Intravenous Given 11/01/18 1904)     Initial Impression / Assessment and Plan / ED Course  I have reviewed the triage vital signs and the nursing notes.  Pertinent labs & imaging results that were available during my care of the patient were reviewed by me and considered in my medical decision making (see chart for details).    Patient presents with recurrent abdominal pain which began yesterday after church. Patient has a colostomy bag in place, and is a recurrent pain for him.  Seen showed no leukocytosis, hemoglobin is consistent with his previous visits.  CMP showed no electrolyte abnormality his creatinine is stable.  Due to patient's previous history of small bowel obstructions will CT to rule out any obstruction along with provide him with some fluids and pain medication to relieve his pain.  CT abdomen and pelvis showed:  Multiple dilated small bowel loops with air-fluid levels of the  abdomen, suggesting at least partial small bowel obstruction. The  etiology is favored to be a combination of both acute and chronic  changes of inflammatory bowel disease in this patient with given  history of Crohn's disease.    There are at least 2 focal short-segment regions of narrowing of  small bowel in the right lower quadrant adjacent to the ostomy,  which may represent  chronic strictures, given these are relatively  similar when compared to the recent prior CT.    Hyperenhancing bowel of the right lower quadrant with a vascular  "comb sign" suggesting active Crohn's disease.    Redemonstration of left spigelian hernia with multiple small bowel  loops.    Redemonstration of lymphadenopathy of the ileal colic mesentery and  of retroperitoneal region.    Redemonstration of pelvic sclerotic metastases and soft tissue in  the right pelvis inseparable from prostate, rectum, and bladder base  in this patient with given history of prostate carcinoma.    Redemonstration of pancreatic ductal dilatation without associated  inflammation.  6:57 PM Patient is followed by Dr. Dorisann Frames in [place]. Will place call for general surgery recommendations.   7:16 PM  Spoke to Dr. Redmond Pulling who will see patient in the ED, he further recommended a GI consult which can be done inpatient.  Will place call to hospitalist for admission.  7:40 PM Spoke to hospitalist who will admit patient at this time.   Final Clinical Impressions(s) / ED Diagnoses   Final diagnoses:  Partial small bowel obstruction [facility])    ED Discharge Orders    None       Janeece Fitting, PA-C 11/01/18 1941    Sherwood Gambler, MD 11/03/18 [phone]

## 2018-11-01 NOTE — ED Notes (Signed)
Admitting at bedside 

## 2018-11-01 NOTE — H&P (Signed)
History and Physical    MATTIX IMHOF QIW:[id number] DOB: 1956-11-14 DOA: 11/01/2018  PCP: Alycia Rossetti, MD  Patient coming from: Home  I have personally briefly reviewed patient's old medical records in [place]  Chief Complaint: Abd pain  HPI: Daniel Howard is a 62 y.o. male with medical history significant of Prostate CA, recurrent and castration resistant now, Crohn's s/p colostomy for Crohn's, recurrent SBOs.  Patient most recently admitted 7/12 and 10/10 for SBOs.  Patient currently on budesonide and Humira for Crohn's.  Recently started on Xtandi earlier this month for the recurrent prostate CA.  Patient presents to the ED with c/o abdominal pain since last night.  Pain began after church.  Pain is intermittent cramp feeling located in lower abdomen.  Norco provides no relief.  He also reports several episodes of diarrhea x 4. Patient does have a colostomy bag in place and noted increase output in this. He denies any alleviating or aggrevating symptoms associated, fever, chest pain, shortness of breath or urinary complaints.   ED Course: CT shows PSBO with possible active Chron's.  Gen surg consulted who recd admission and IP GI consult.   Review of Systems: As per HPI otherwise 10 point review of systems negative.   Past Medical History:  Diagnosis Date  . Abnormal finding of biliary tract    MRCP shows pancreatic/biliary tract dilation. EUS 2010 confirmed dilation but no chronic pancreaitis or mass. Vascular ectasia crimpoing distal CBD.   [name] Anxiety   . 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 Duodenal ulcer 2010   nsaids  . History of blood transfusion 1992   "related to colon OR"  . Peristomal hernia   . Prostate cancer ([place]) 2018  . Small bowel obstruction ([place]) 10/2017; 11/21/2017; 02/16/2018  . Spigelian hernia    bilateral    Past Surgical History:  Procedure Laterality Date  . APPENDECTOMY  1992   at time of exp laparotomy at which time he was noted to have fistulizing Crohn's rather than UC  . COLON SURGERY    . COLONOSCOPY [name] 08/23/2014   ERD:EYCXKGY proctoscopy with possible fistulous opening in thebase of rectal/anal stump.    . COLOSTOMY  1992   transverse loop colostomy secondary to a stricture  . ESOPHAGOGASTRODUODENOSCOPY  05/2009   SLF: multiple antral erosions, large ulcer at ansatomosis (postsurgical changes at duodenal bulb and second portion of duodenum) BX c/x NSAIDS.  . EUS  10/04/2009   Dr. Estill Bakes dilated CBD and main pancreatic duct.  No pancreatic  . EXPLORATORY LAPAROTOMY  1992  . FLEXIBLE SIGMOIDOSCOPY  1988   Dr. Laural Golden- suggested rohn's disease but the biopsies were not collaborative.  [name] FLEXIBLE SIGMOIDOSCOPY [name] 09/08/2016   Procedure: FLEXIBLE SIGMOIDOSCOPY;  Surgeon: Wonda Horner, MD;  Location: [facility] ENDOSCOPY;  Service: Gastroenterology;  Laterality: [name];  . HEMICOLOECTOMY W/ ANASTOMOSIS  1993   R- Dr.DeMason   . HERNIA REPAIR  1996   incarcerated periostial hernia with additional surgery in 1999  . IR FLUORO GUIDE PORT INSERTION RIGHT  04/03/2017  . IR US GUIDE VASC ACCESS RIGHT  04/03/2017     reports that he quit smoking about 8 years ago.  His smoking use included cigarettes. He has a 24.00 pack-year smoking history. He has never used smokeless tobacco. He reports that he does not drink alcohol or use drugs.  Allergies  Allergen Reactions  . Penicillins Hives    Has patient had a PCN reaction causing immediate rash, facial/tongue/throat swelling, SOB or lightheadedness with hypotension: Yes Has patient had a PCN reaction causing severe rash involving  mucus membranes or skin necrosis: No Has patient had a PCN reaction that required hospitalization: No Has patient had a PCN reaction occurring within the last 10 years: No If all of the above answers are "NO", then may proceed with Cephalosporin use.     Family History  Problem Relation Age of Onset  . Cancer Father        prostate   . Prostate cancer Father   . Colon cancer Father 18  . Hypertension Sister   . Cancer Sister   . Depression Sister   . Breast cancer Sister   . COPD Sister   . Aneurysm Brother        deceased, brain aneurysm     Prior to Admission medications   Medication Sig Start Date End Date Taking? Authorizing Provider  Adalimumab (HUMIRA PEN) 40 MG/0.8ML PNKT Inject 1 Syringe into the skin once a week. Patient taking differently: Inject 1 Syringe into the skin every Wednesday.  09/01/18   Annitta Needs, NP  budesonide (ENTOCORT EC) 3 MG 24 hr capsule Take 6 mg by mouth daily.    [provider]  calcium carbonate (OS-CAL) 600 MG TABS tablet Take 1,200 mg by mouth daily with breakfast.    [provider]  enzalutamide (XTANDI) 40 MG capsule Take 4 capsules (160 mg total) by mouth daily. 10/22/18   [name], MD  ferrous sulfate 325 (65 FE) MG tablet Take 325 mg by mouth daily with breakfast.    [provider]  HYDROcodone-acetaminophen (NORCO) 5-325 MG tablet Take 1 tablet by mouth every 6 (six) hours as needed for moderate pain. 10/28/18   Susy Frizzle, MD  lidocaine-prilocaine (EMLA) cream Apply 1 application topically as needed (when accessing port).    [provider]  mirtazapine (REMERON) 15 MG tablet TAKE 1 TABLET BY MOUTH AT BEDTIME Patient taking differently: Take 15 mg by mouth at bedtime.  09/27/18   [place], [name], MD  Multiple Vitamins-Minerals (CENTRUM SILVER ADULT 50+) TABS Take 1 tablet by mouth daily.    [provider]    Physical Exam: Vitals:   11/01/18 1300 11/01/18 1315 11/01/18  1330 11/01/18 1722  BP: 111/78 113/75 118/78 131/83  Pulse: 81 79 85 81  Resp:    18  Temp:      TempSrc:      SpO2: 97% 96% 97% 100%    Constitutional: NAD, calm, comfortable Eyes: PERRL, lids and conjunctivae normal ENMT: Mucous membranes are moist. Posterior pharynx clear of any exudate or lesions.Normal dentition.  Neck: normal, supple, no masses, no thyromegaly Respiratory: clear to auscultation bilaterally, no wheezing, no crackles. Normal respiratory effort. No accessory muscle use.  Cardiovascular: Regular rate and rhythm, no murmurs / rubs / gallops. No extremity edema. 2+ pedal pulses. No carotid bruits.  Abdomen: no tenderness, no masses palpated. No hepatosplenomegaly. Bowel sounds positive.  Musculoskeletal: no clubbing / cyanosis. No joint deformity upper and lower extremities. Good ROM, no contractures. Normal muscle tone.  Skin: no rashes, lesions, ulcers. No induration Neurologic: CN 2-12 grossly intact. Sensation intact, DTR  normal. Strength 5/5 in all 4.  Psychiatric: Normal judgment and insight. Alert and oriented x 3. Normal mood.    Labs on Admission: I have personally reviewed following labs and imaging studies  CBC: Recent Labs  Lab 10/26/18 1523 11/01/18 1245  WBC 9.1 7.6  NEUTROABS  --  4.0  HGB 10.8* 12.2*  HCT 35.0* 39.5  MCV 100.0 100.0  PLT 296 063   Basic Metabolic Panel: Recent Labs  Lab 10/26/18 1523 11/01/18 1245  NA 140 137  K 3.5 3.9  CL 110 107  CO2 23 23  GLUCOSE 136* 85  BUN 10 10  CREATININE 1.12 1.01  CALCIUM 8.6* 8.3*   GFR: Estimated Creatinine Clearance: 65.1 mL/min (by C-G formula based on SCr of 1.01 mg/dL). Liver Function Tests: Recent Labs  Lab 11/01/18 1245  AST 27  ALT 17  ALKPHOS 45  BILITOT 0.9  PROT 7.4  ALBUMIN 3.3*   Recent Labs  Lab 11/01/18 1245  LIPASE 24   No results for input(s): AMMONIA in the last 168 hours. Coagulation Profile: No results for input(s): INR, PROTIME in the last 168  hours. Cardiac Enzymes: No results for input(s): CKTOTAL, CKMB, CKMBINDEX, TROPONINI in the last 168 hours. BNP (last 3 results) No results for input(s): PROBNP in the last 8760 hours. HbA1C: No results for input(s): HGBA1C in the last 72 hours. CBG: No results for input(s): GLUCAP in the last 168 hours. Lipid Profile: No results for input(s): CHOL, HDL, LDLCALC, TRIG, CHOLHDL, LDLDIRECT in the last 72 hours. Thyroid Function Tests: No results for input(s): TSH, T4TOTAL, FREET4, T3FREE, THYROIDAB in the last 72 hours. Anemia Panel: No results for input(s): VITAMINB12, FOLATE, FERRITIN, TIBC, IRON, RETICCTPCT in the last 72 hours. Urine analysis:    Component Value Date/Time   COLORURINE YELLOW 11/01/2018 [place] 11/01/2018 1303   LABSPEC 1.025 11/01/2018 1303   PHURINE 5.0 11/01/2018 1303   GLUCOSEU NEGATIVE 11/01/2018 1303   HGBUR NEGATIVE 11/01/2018 1303   BILIRUBINUR NEGATIVE 11/01/2018 1303   KETONESUR NEGATIVE 11/01/2018 1303   PROTEINUR NEGATIVE 11/01/2018 1303   UROBILINOGEN 0.2 10/10/2014 1037   NITRITE NEGATIVE 11/01/2018 1303   LEUKOCYTESUR NEGATIVE 11/01/2018 1303    Radiological Exams on Admission: Ct Abdomen Pelvis W Contrast  Result Date: 11/01/2018 CLINICAL DATA:  62 year old male with a history of midline abdominal pain for 1 day. Diarrhea. History of prior obstructions. History of Crohn disease. EXAM: CT ABDOMEN AND PELVIS WITH CONTRAST TECHNIQUE: Multidetector CT imaging of the abdomen and pelvis was performed using the standard protocol following bolus administration of intravenous contrast. CONTRAST:  158m OMNIPAQUE IOHEXOL 300 MG/ML  SOLN COMPARISON:  CT 09/30/2018 FINDINGS: Lower chest: No acute abnormality. Hepatobiliary: Unremarkable appearance of liver. Unremarkable gallbladder. Pancreas: Unremarkable pancreas parenchyma. Redemonstration of pancreatic duct dilation with no radiopaque stones. Spleen: Unremarkable spleen Adrenals/Urinary  Tract: Unremarkable adrenal glands. Unremarkable appearance of the kidneys with no hydronephrosis. No nephrolithiasis. Urinary bladder unchanged from the prior. High density material layered in the right aspect of the urinary bladder, contralateral from the prior CT likely representing mobile stones. Stomach/Bowel: Small hiatal hernia.  Unremarkable stomach. Similar configuration of small bowel to the comparison CT scan with multiple dilated small bowel loops and associated air-fluid levels. There are hyperenhancing and decompressed small bowel loops in the low abdomen, just superior to the ostomy. Associated increased vasculature of the mesenteric arterial system/venous system, best appreciated on the coronal reformatted images and compatible with a comb sign. There are at  least 2 sites of persistent narrowing of small bowel when compared to the CT dated 09/30/2018, suggesting chronic strictures and may be related to the appearance of obstructed bowel. Stoma of the right lower quadrant. Similar appearance of the hernia sac of the left spigelian region containing multiple loops of small bowel. None of these are significantly dilated. Vascular/Lymphatic: No significant vascular calcifications. Multiple lymph nodes of the retroperitoneum are again visualized, most prominent on the left. Multiple enlarged lymph nodes of the right lower quadrant associated with the ileal colic mesentery. Reproductive: Mass of the right aspect of the pelvis, unchanged from prior measuring 2.7 cm x 5.5 cm on the current CT, relatively unchanged from prior. This is inseparable with the residual rectum in prostate. And is inseparable from the bladder base. Other: Surgical changes of the midline abdomen. Musculoskeletal: No acute displaced fracture. Redemonstration of sclerotic lesions of the bilateral iliac bones, pubic bone, bilateral ischial bones, right proximal femur. IMPRESSION: Multiple dilated small bowel loops with air-fluid levels  of the abdomen, suggesting at least partial small bowel obstruction. The etiology is favored to be a combination of both acute and chronic changes of inflammatory bowel disease in this patient with given history of Crohn's disease. There are at least 2 focal short-segment regions of narrowing of small bowel in the right lower quadrant adjacent to the ostomy, which may represent chronic strictures, given these are relatively similar when compared to the recent prior CT. Hyperenhancing bowel of the right lower quadrant with a vascular "comb sign" suggesting active Crohn's disease. Redemonstration of left spigelian hernia with multiple small bowel loops. Redemonstration of lymphadenopathy of the ileal colic mesentery and of retroperitoneal region. Redemonstration of pelvic sclerotic metastases and soft tissue in the right pelvis inseparable from prostate, rectum, and bladder base in this patient with given history of prostate carcinoma. Redemonstration of pancreatic ductal dilatation without associated inflammation. Electronically Signed   By: Corrie Mckusick D.O.   On: 11/01/2018 18:36    EKG: Independently reviewed.  Assessment/Plan Principal Problem:   Partial small bowel obstruction (HCC) Active Problems:   Crohn's disease of both small and large intestine with complication (HCC)   Exacerbation of Crohn's disease ([place])   Prostate cancer ([place])   Colostomy status ([place])    1. PSBO - 1. NPO except meds 2. IVF: NS at 75 cc/hr 3. Surg recd GI consult IP - presumably not impressed by spigelian hernia or thinking that hernia is responsible for SBO/PSBO. 4. Fentanyl PRN pain 5. Intake and output 2. Chron's with possible exacerbation - 1. GI consult in AM, will hold off on ordering further immunosuppressives for the moment on patient who is already on budesonide and a biologic. 3. Prostate CA - Continue PO antiandrogen therapy  DVT prophylaxis: Lovenox Code Status: Full Family Communication: No  family in room Disposition Plan: Home eventually Consults called: Gen surg, needs GI consult in AM Admission status: Place in obs - likely will need IP conversion tomorrow.  Etta Quill DO [facility] Pager [phone] Only works nights!  If 7AM-7PM, please contact the primary day team physician taking care of patient  [email or web address] Password [facility]  11/01/2018, 7:58 PM

## 2018-11-02 ENCOUNTER — Other Ambulatory Visit: Payer: Self-pay

## 2018-11-02 DIAGNOSIS — R1084 Generalized abdominal pain: Secondary | ICD-10-CM | POA: Diagnosis not present

## 2018-11-02 DIAGNOSIS — C61 Malignant neoplasm of prostate: Secondary | ICD-10-CM | POA: Diagnosis not present

## 2018-11-02 DIAGNOSIS — K50819 Crohn's disease of both small and large intestine with unspecified complications: Secondary | ICD-10-CM | POA: Diagnosis not present

## 2018-11-02 DIAGNOSIS — K56600 Partial intestinal obstruction, unspecified as to cause: Secondary | ICD-10-CM | POA: Diagnosis not present

## 2018-11-02 LAB — BASIC METABOLIC PANEL
Anion gap: 5 (ref 5–15)
BUN: 6 mg/dL (ref 6–20)
CALCIUM: 7.6 mg/dL — AB (ref 8.9–10.3)
CO2: 25 mmol/L (ref 22–32)
Chloride: 107 mmol/L (ref 98–111)
Creatinine, Ser: 1.04 mg/dL (ref 0.61–1.24)
GFR calc Af Amer: >60 mL/min (ref >60)
GLUCOSE: 82 mg/dL (ref 70–99)
Potassium: 3.6 mmol/L (ref 3.5–5.1)
Sodium: 137 mmol/L (ref 135–145)

## 2018-11-02 LAB — CBC
HCT: 33.6 % — ABNORMAL LOW (ref 39.0–52.0)
Hemoglobin: 10.3 g/dL — ABNORMAL LOW (ref 13.0–17.0)
MCH: 30.3 pg (ref 26.0–34.0)
MCHC: 30.7 g/dL (ref 30.0–36.0)
MCV: 98.8 fL (ref 80.0–100.0)
NRBC: 0 % (ref 0.0–0.2)
PLATELETS: 292 10*3/uL (ref 150–400)
RBC: 3.4 MIL/uL — ABNORMAL LOW (ref 4.22–5.81)
RDW: 14.7 % (ref 11.5–15.5)
WBC: 5.3 10*3/uL (ref 4.0–10.5)

## 2018-11-02 NOTE — Progress Notes (Signed)
 Subjective/Chief Complaint: Pain improved. No nausea or emesis. Continues to have ostomy output.    Objective: Vital signs in last 24 hours: Temp:  [98.3 F (36.8 C)-98.7 F (37.1 C)] 98.3 F (36.8 C) (11/11 2044) Pulse Rate:  [79-85] 79 (11/11 2044) Resp:  [16-18] 16 (11/11 2044) BP: (106-131)/(72-83) 112/72 (11/11 2044) SpO2:  [96 %-100 %] 100 % (11/11 2044) Last BM Date: 11/01/18  Intake/Output from previous day: 11/11 0701 - 11/12 0700 In: 742.5 [I.V.:742.5] Out: 750 [Urine:750] Intake/Output this shift: No intake/output data recorded.  General appearance: alert and cooperative Resp: unlabored respirations GI: soft, nontender, nondistended. RLQ ileostomy with stable chronic 5cm prolapse. No tenderness over chronically incarcerated LLQ hernia. Multiple surgical scars.  Extremities: extremities normal, atraumatic, no cyanosis or edema Skin: Skin color, texture, turgor normal. No rashes or lesions Neurologic: Grossly normal  Lab Results:  Recent Labs    11/01/18 1245 11/02/18 0115  WBC 7.6 5.3  HGB 12.2* 10.3*  HCT 39.5 33.6*  PLT 268 292   BMET Recent Labs    11/01/18 1245 11/02/18 0115  NA 137 137  K 3.9 3.6  CL 107 107  CO2 23 25  GLUCOSE 85 82  BUN 10 6  CREATININE 1.01 1.04  CALCIUM 8.3* 7.6*   PT/INR No results for input(s): LABPROT, INR in the last 72 hours. ABG No results for input(s): PHART, HCO3 in the last 72 hours.  Invalid input(s): PCO2, PO2  Studies/Results: Ct Abdomen Pelvis W Contrast  Result Date: 11/01/2018 CLINICAL DATA:  62-year-old male with a history of midline abdominal pain for 1 day. Diarrhea. History of prior obstructions. History of Crohn disease. EXAM: CT ABDOMEN AND PELVIS WITH CONTRAST TECHNIQUE: Multidetector CT imaging of the abdomen and pelvis was performed using the standard protocol following bolus administration of intravenous contrast. CONTRAST:  100mL OMNIPAQUE IOHEXOL 300 MG/ML  SOLN COMPARISON:  CT  09/30/2018 FINDINGS: Lower chest: No acute abnormality. Hepatobiliary: Unremarkable appearance of liver. Unremarkable gallbladder. Pancreas: Unremarkable pancreas parenchyma. Redemonstration of pancreatic duct dilation with no radiopaque stones. Spleen: Unremarkable spleen Adrenals/Urinary Tract: Unremarkable adrenal glands. Unremarkable appearance of the kidneys with no hydronephrosis. No nephrolithiasis. Urinary bladder unchanged from the prior. High density material layered in the right aspect of the urinary bladder, contralateral from the prior CT likely representing mobile stones. Stomach/Bowel: Small hiatal hernia.  Unremarkable stomach. Similar configuration of small bowel to the comparison CT scan with multiple dilated small bowel loops and associated air-fluid levels. There are hyperenhancing and decompressed small bowel loops in the low abdomen, just superior to the ostomy. Associated increased vasculature of the mesenteric arterial system/venous system, best appreciated on the coronal reformatted images and compatible with a comb sign. There are at least 2 sites of persistent narrowing of small bowel when compared to the CT dated 09/30/2018, suggesting chronic strictures and may be related to the appearance of obstructed bowel. Stoma of the right lower quadrant. Similar appearance of the hernia sac of the left spigelian region containing multiple loops of small bowel. None of these are significantly dilated. Vascular/Lymphatic: No significant vascular calcifications. Multiple lymph nodes of the retroperitoneum are again visualized, most prominent on the left. Multiple enlarged lymph nodes of the right lower quadrant associated with the ileal colic mesentery. Reproductive: Mass of the right aspect of the pelvis, unchanged from prior measuring 2.7 cm x 5.5 cm on the current CT, relatively unchanged from prior. This is inseparable with the residual rectum in prostate. And is inseparable from the bladder    base. Other: Surgical changes of the midline abdomen. Musculoskeletal: No acute displaced fracture. Redemonstration of sclerotic lesions of the bilateral iliac bones, pubic bone, bilateral ischial bones, right proximal femur. IMPRESSION: Multiple dilated small bowel loops with air-fluid levels of the abdomen, suggesting at least partial small bowel obstruction. The etiology is favored to be a combination of both acute and chronic changes of inflammatory bowel disease in this patient with given history of Crohn's disease. There are at least 2 focal short-segment regions of narrowing of small bowel in the right lower quadrant adjacent to the ostomy, which may represent chronic strictures, given these are relatively similar when compared to the recent prior CT. Hyperenhancing bowel of the right lower quadrant with a vascular "comb sign" suggesting active Crohn's disease. Redemonstration of left spigelian hernia with multiple small bowel loops. Redemonstration of lymphadenopathy of the ileal colic mesentery and of retroperitoneal region. Redemonstration of pelvic sclerotic metastases and soft tissue in the right pelvis inseparable from prostate, rectum, and bladder base in this patient with given history of prostate carcinoma. Redemonstration of pancreatic ductal dilatation without associated inflammation. Electronically Signed   By: Jaime  Wagner D.O.   On: 11/01/2018 18:36    Anti-infectives: Anti-infectives (From admission, onward)   None      Assessment/Plan: Abdominal pain Crohn's disease on biologic therapy Met prostate cancer on chemo Multiple prior admissions for SBO Chronic incarcerated Incisional hernia  Similar presentation as previous times.  No surgical abdomen - soft, mild TTP. No fever. No wbc. No signs of strangulation on CT ?active flare on CT  Doesn't appear this psbo is due to incisional hernia Adhesions vs chronic stricture? Clinically he is not obstructed.  Consider GI  consult to see if needs txt for possible active flare? OK to try clears from surgery standpoint    LOS: 0 days     A  11/02/2018 

## 2018-11-02 NOTE — Progress Notes (Signed)
Progress Note    Daniel Howard  MNO:[id number] DOB: 1956-02-10  DOA: 11/01/2018 PCP: Alycia Rossetti, MD    Brief Narrative:     Medical records reviewed and are as summarized below:  Daniel Howard is an 62 y.o. male with medical history significant of Prostate CA, recurrent and castration resistant now, Crohn's s/p colostomy for Crohn's, recurrent SBOs.  Assessment/Plan:   Principal Problem:   Partial small bowel obstruction (HCC) Active Problems:   Crohn's disease of both small and large intestine with complication (HCC)   Exacerbation of Crohn's disease ([place])   Prostate cancer ([place])   Colostomy status ([place])  pSBO -resolved? -general surgery consult appreciated -start clears-- advance as tolerated -PRN pain control -ambulation -output in colostomy  Chron's  -spoke with Dr. Tami Lin with GI -can follow up outpatient with has GI doc in [place]-- would get appointment prior to discharge -if patient worsens/does not tolerate food can call back  Prostate CA -continue home meds  Anemia- unknown kind -seems to be at baseline   Family Communication/Anticipated D/C date and plan/Code Status   DVT prophylaxis: Lovenox ordered. Code Status: Full Code.  Family Communication: none in room Disposition Plan: outpatient GI follow up pending tolerating orals   Medical Consultants:    GI (phone)  General surgery  Subjective:   Hungry, feels better  Objective:    Vitals:   11/01/18 1315 11/01/18 1330 11/01/18 1722 11/01/18 2044  BP: 113/75 118/78 131/83 112/72  Pulse: 79 85 81 79  Resp:   18 16  Temp:    98.3 F (36.8 C)  TempSrc:    Oral  SpO2: 96% 97% 100% 100%    Intake/Output Summary (Last 24 hours) at 11/02/2018 1102 Last data filed at 11/02/2018 0910 Gross per 24 hour  Intake 742.5 ml  Output 750 ml  Net -7.5 ml   There were no vitals filed for this visit.  Exam: In bed, NAD No wheezing, no increased work of breathing Watery  output in ileostomy bag, numerous scars on abdomen, NT No LE edema Pleasant and cooperative  Data Reviewed:   I have personally reviewed following labs and imaging studies:  Labs: Labs show the following:   Basic Metabolic Panel: Recent Labs  Lab 10/26/18 1523 11/01/18 1245 11/02/18 0115  NA 140 137 137  K 3.5 3.9 3.6  CL 110 107 107  CO2 23 23 25   GLUCOSE 136* 85 82  BUN 10 10 6   CREATININE 1.12 1.01 1.04  CALCIUM 8.6* 8.3* 7.6*   GFR Estimated Creatinine Clearance: 63.2 mL/min (by C-G formula based on SCr of 1.04 mg/dL). Liver Function Tests: Recent Labs  Lab 11/01/18 1245  AST 27  ALT 17  ALKPHOS 45  BILITOT 0.9  PROT 7.4  ALBUMIN 3.3*   Recent Labs  Lab 11/01/18 1245  LIPASE 24   No results for input(s): AMMONIA in the last 168 hours. Coagulation profile No results for input(s): INR, PROTIME in the last 168 hours.  CBC: Recent Labs  Lab 10/26/18 1523 11/01/18 1245 11/02/18 0115  WBC 9.1 7.6 5.3  NEUTROABS  --  4.0  --   HGB 10.8* 12.2* 10.3*  HCT 35.0* 39.5 33.6*  MCV 100.0 100.0 98.8  PLT 296 268 292   Cardiac Enzymes: No results for input(s): CKTOTAL, CKMB, CKMBINDEX, TROPONINI in the last 168 hours. BNP (last 3 results) No results for input(s): PROBNP in the last 8760 hours. CBG: No results for input(s): GLUCAP in the  last 168 hours. D-Dimer: No results for input(s): DDIMER in the last 72 hours. Hgb A1c: No results for input(s): HGBA1C in the last 72 hours. Lipid Profile: No results for input(s): CHOL, HDL, LDLCALC, TRIG, CHOLHDL, LDLDIRECT in the last 72 hours. Thyroid function studies: No results for input(s): TSH, T4TOTAL, T3FREE, THYROIDAB in the last 72 hours.  Invalid input(s): FREET3 Anemia work up: No results for input(s): VITAMINB12, FOLATE, FERRITIN, TIBC, IRON, RETICCTPCT in the last 72 hours. Sepsis Labs: Recent Labs  Lab 10/26/18 1523 11/01/18 1245 11/02/18 0115  WBC 9.1 7.6 5.3    Microbiology No results  found for this or any previous visit (from the past 240 hour(s)).  Procedures and diagnostic studies:  Ct Abdomen Pelvis W Contrast  Result Date: 11/01/2018 CLINICAL DATA:  62 year old male with a history of midline abdominal pain for 1 day. Diarrhea. History of prior obstructions. History of Crohn disease. EXAM: CT ABDOMEN AND PELVIS WITH CONTRAST TECHNIQUE: Multidetector CT imaging of the abdomen and pelvis was performed using the standard protocol following bolus administration of intravenous contrast. CONTRAST:  158m OMNIPAQUE IOHEXOL 300 MG/ML  SOLN COMPARISON:  CT 09/30/2018 FINDINGS: Lower chest: No acute abnormality. Hepatobiliary: Unremarkable appearance of liver. Unremarkable gallbladder. Pancreas: Unremarkable pancreas parenchyma. Redemonstration of pancreatic duct dilation with no radiopaque stones. Spleen: Unremarkable spleen Adrenals/Urinary Tract: Unremarkable adrenal glands. Unremarkable appearance of the kidneys with no hydronephrosis. No nephrolithiasis. Urinary bladder unchanged from the prior. High density material layered in the right aspect of the urinary bladder, contralateral from the prior CT likely representing mobile stones. Stomach/Bowel: Small hiatal hernia.  Unremarkable stomach. Similar configuration of small bowel to the comparison CT scan with multiple dilated small bowel loops and associated air-fluid levels. There are hyperenhancing and decompressed small bowel loops in the low abdomen, just superior to the ostomy. Associated increased vasculature of the mesenteric arterial system/venous system, best appreciated on the coronal reformatted images and compatible with a comb sign. There are at least 2 sites of persistent narrowing of small bowel when compared to the CT dated 09/30/2018, suggesting chronic strictures and may be related to the appearance of obstructed bowel. Stoma of the right lower quadrant. Similar appearance of the hernia sac of the left spigelian region  containing multiple loops of small bowel. None of these are significantly dilated. Vascular/Lymphatic: No significant vascular calcifications. Multiple lymph nodes of the retroperitoneum are again visualized, most prominent on the left. Multiple enlarged lymph nodes of the right lower quadrant associated with the ileal colic mesentery. Reproductive: Mass of the right aspect of the pelvis, unchanged from prior measuring 2.7 cm x 5.5 cm on the current CT, relatively unchanged from prior. This is inseparable with the residual rectum in prostate. And is inseparable from the bladder base. Other: Surgical changes of the midline abdomen. Musculoskeletal: No acute displaced fracture. Redemonstration of sclerotic lesions of the bilateral iliac bones, pubic bone, bilateral ischial bones, right proximal femur. IMPRESSION: Multiple dilated small bowel loops with air-fluid levels of the abdomen, suggesting at least partial small bowel obstruction. The etiology is favored to be a combination of both acute and chronic changes of inflammatory bowel disease in this patient with given history of Crohn's disease. There are at least 2 focal short-segment regions of narrowing of small bowel in the right lower quadrant adjacent to the ostomy, which may represent chronic strictures, given these are relatively similar when compared to the recent prior CT. Hyperenhancing bowel of the right lower quadrant with a vascular "comb sign" suggesting active  Crohn's disease. Redemonstration of left spigelian hernia with multiple small bowel loops. Redemonstration of lymphadenopathy of the ileal colic mesentery and of retroperitoneal region. Redemonstration of pelvic sclerotic metastases and soft tissue in the right pelvis inseparable from prostate, rectum, and bladder base in this patient with given history of prostate carcinoma. Redemonstration of pancreatic ductal dilatation without associated inflammation. Electronically Signed   By: Corrie Mckusick D.O.   On: 11/01/2018 18:36    Medications:   . budesonide  6 mg Oral Daily  . calcium carbonate  1 tablet Oral Q breakfast  . enoxaparin (LOVENOX) injection  40 mg Subcutaneous Q24H  . enzalutamide  160 mg Oral Daily  . ferrous sulfate  325 mg Oral Q breakfast  . mirtazapine  15 mg Oral QHS  . multivitamin with minerals  1 tablet Oral Daily   Continuous Infusions: . sodium chloride 75 mL/hr at 11/02/18 0956     LOS: 0 days   Geradine Girt  [facility]   *Please refer to [email or web address], password [email or web address] to get updated schedule on who will round on this patient, as hospitalists switch teams weekly. If 7PM-7AM, please contact night-coverage at [email or web address], password [email or web address] for any overnight needs.  11/02/2018, 11:02 AM

## 2018-11-03 DIAGNOSIS — K56600 Partial intestinal obstruction, unspecified as to cause: Secondary | ICD-10-CM | POA: Diagnosis not present

## 2018-11-03 DIAGNOSIS — K50819 Crohn's disease of both small and large intestine with unspecified complications: Secondary | ICD-10-CM | POA: Diagnosis not present

## 2018-11-03 DIAGNOSIS — C61 Malignant neoplasm of prostate: Secondary | ICD-10-CM | POA: Diagnosis not present

## 2018-11-03 DIAGNOSIS — R1084 Generalized abdominal pain: Secondary | ICD-10-CM | POA: Diagnosis not present

## 2018-11-03 LAB — BASIC METABOLIC PANEL
Anion gap: 4 — ABNORMAL LOW (ref 5–15)
CHLORIDE: 111 mmol/L (ref 98–111)
CO2: 23 mmol/L (ref 22–32)
Calcium: 7.8 mg/dL — ABNORMAL LOW (ref 8.9–10.3)
Creatinine, Ser: 1.09 mg/dL (ref 0.61–1.24)
GFR calc Af Amer: >60 mL/min (ref >60)
GFR calc non Af Amer: >60 mL/min (ref >60)
Glucose, Bld: 90 mg/dL (ref 70–99)
POTASSIUM: 3.7 mmol/L (ref 3.5–5.1)
Sodium: 138 mmol/L (ref 135–145)

## 2018-11-03 LAB — CBC
HCT: 32.6 % — ABNORMAL LOW (ref 39.0–52.0)
Hemoglobin: 10.1 g/dL — ABNORMAL LOW (ref 13.0–17.0)
MCH: 30.2 pg (ref 26.0–34.0)
MCHC: 31 g/dL (ref 30.0–36.0)
MCV: 97.6 fL (ref 80.0–100.0)
NRBC: 0 % (ref 0.0–0.2)
Platelets: 290 10*3/uL (ref 150–400)
RBC: 3.34 MIL/uL — AB (ref 4.22–5.81)
RDW: 14.6 % (ref 11.5–15.5)
WBC: 4.6 10*3/uL (ref 4.0–10.5)

## 2018-11-03 NOTE — Progress Notes (Signed)
    CC: Abdominal pain  Subjective: Patient is up in the chair.  No nausea or vomiting.  Tolerating clear liquids well.  Good output from his ostomy.  Objective: Vital signs in last 24 hours: Temp:  [97.6 F (36.4 C)-98.7 F (37.1 C)] 98.2 F (36.8 C) (11/13 0548) Pulse Rate:  [63-81] 63 (11/13 0548) Resp:  [16-18] 16 (11/13 0548) BP: (108-128)/(76-84) 108/76 (11/13 0548) SpO2:  [98 %-100 %] 100 % (11/13 0548) Last BM Date: 11/01/18 1137 p.o. 600 urine Stool x6 Afebrile vital signs are stable Labs stable   Intake/Output from previous day: 11/12 0701 - 11/13 0700 In: 1137 [P.O.:1137] Out: 600 [Urine:600] Intake/Output this shift: No intake/output data recorded.  General appearance: alert, cooperative and no distress GI: He has a lot of scars abdomen with ongoing incarcerated hernia.  No pain.  Good ostomy output.  Bag is full of stool and gas.  Lab Results:  Recent Labs    11/02/18 0115 11/03/18 0113  WBC 5.3 4.6  HGB 10.3* 10.1*  HCT 33.6* 32.6*  PLT 292 290    BMET Recent Labs    11/02/18 0115 11/03/18 0113  NA 137 138  K 3.6 3.7  CL 107 111  CO2 25 23  GLUCOSE 82 90  BUN 6 <5*  CREATININE 1.04 1.09  CALCIUM 7.6* 7.8*   PT/INR No results for input(s): LABPROT, INR in the last 72 hours.  Recent Labs  Lab 11/01/18 1245  AST 27  ALT 17  ALKPHOS 45  BILITOT 0.9  PROT 7.4  ALBUMIN 3.3*     Lipase     Component Value Date/Time   LIPASE 24 11/01/2018 1245     Medications: . budesonide  6 mg Oral Daily  . calcium carbonate  1 tablet Oral Q breakfast  . enoxaparin (LOVENOX) injection  40 mg Subcutaneous Q24H  . enzalutamide  160 mg Oral Daily  . ferrous sulfate  325 mg Oral Q breakfast  . mirtazapine  15 mg Oral QHS  . multivitamin with minerals  1 tablet Oral Daily   . sodium chloride 75 mL/hr at 11/02/18 2253   Anti-infectives (From admission, onward)   None     Assessment/Plan Crohn's disease on biologic therapy Met  prostate cancer on chemo  Abdominal pain Multiple prior admissions for SBO Chronic incarcerated Incisional hernia  FEN: IV fluids/clear liquids ID: None DVT: Lovenox Follow-up: TBD  Plan: Advance diet as tolerated.  Follow-up with GI for his Crohn's disease.  LOS: 0 days    Ezma Rehm 11/03/2018 [id number]

## 2018-11-03 NOTE — Discharge Summary (Signed)
Physician Discharge Summary  Daniel Howard GYB:[id number] DOB: 02/12/1956 DOA: 11/01/2018  PCP: Alycia Rossetti, MD  Admit date: 11/01/2018 Discharge date: 11/03/2018  Time spent: 30 minutes  Recommendations for Outpatient Follow-up:  1. Follow up with Dr Gala Romney 11/19 as scheduled 2. Soft diet to be advanced slowly   Discharge Diagnoses:  Principal Problem:   Partial small bowel obstruction (HCC) Active Problems:   Crohn's disease of both small and large intestine with complication (HCC)   Exacerbation of Crohn's disease ([place])   Prostate cancer ([place])   Colostomy status ([place])   Discharge Condition: stab;e  Diet recommendation: soft diet to be advanced slowly  There were no vitals filed for this visit.  History of present illness:  Daniel Howard is an 62 y.o. male with medical history significant ofProstate CA, recurrent and castration resistant now, Crohn's s/p colostomy for Crohn's, recurrent SBOs admitted for partial SBO  Hospital Course:  PSBO. Evaluated by general surgery who opined no surgical abdomen and wondered of possible chron's flare. Also questioned adhesions vs chronic stricture. Provided bowel rest and symptoms resolved. At discharge tolerating full/soft diet, no abdominal pain.  Chron's  Has follow up appointment Dr Gala Romney primary GI 11/09/2018  Prostate CA -continue home meds  Anemia- unknown kind -seems to be at baseline  Procedures:    Consultations:  Dr Redmond Pulling general surgery  Discharge Exam: Vitals:   11/02/18 2107 11/03/18 0548  BP: 117/79 108/76  Pulse: 78 63  Resp: 16 16  Temp: 98.7 F (37.1 C) 98.2 F (36.8 C)  SpO2: 98% 100%    General: lying in bed watching TV. Somewhat thin but in no acute distress Cardiovascular: rrr no MGR no LE edema Respiratory: normal effort BS clear bilaterally no wheeze Abdomen: soft +BS ostomy pink stoma with brown stool  Discharge Instructions   Discharge Instructions    Call MD for:   persistant nausea and vomiting   Complete by:  As directed    Call MD for:  severe uncontrolled pain   Complete by:  As directed    Call MD for:  temperature >100.4   Complete by:  As directed    Diet - low sodium heart healthy   Complete by:  As directed    Discharge instructions   Complete by:  As directed    Continue soft diet. Advance diet slowly as tolerated Follow up with primary GI regarding Chron's disease   Increase activity slowly   Complete by:  As directed      Allergies as of 11/03/2018      Reactions   Penicillins Hives   Has patient had a PCN reaction causing immediate rash, facial/tongue/throat swelling, SOB or lightheadedness with hypotension: Yes Has patient had a PCN reaction causing severe rash involving mucus membranes or skin necrosis: No Has patient had a PCN reaction that required hospitalization: No Has patient had a PCN reaction occurring within the last 10 years: No If all of the above answers are "NO", then may proceed with Cephalosporin use.      Medication List    TAKE these medications   Adalimumab 40 MG/0.8ML Pnkt Inject 1 Syringe into the skin once a week. What changed:  when to take this   budesonide 3 MG 24 hr capsule Commonly known as:  ENTOCORT EC Take 6 mg by mouth daily.   calcium carbonate 600 MG Tabs tablet Commonly known as:  OS-CAL Take 1,200 mg by mouth daily with breakfast.   CENTRUM SILVER  ADULT 50+ Tabs Take 1 tablet by mouth daily.   enzalutamide 40 MG capsule Commonly known as:  XTANDI Take 4 capsules (160 mg total) by mouth daily.   ferrous sulfate 325 (65 FE) MG tablet Take 325 mg by mouth daily with breakfast.   HYDROcodone-acetaminophen 5-325 MG tablet Commonly known as:  NORCO/VICODIN Take 1 tablet by mouth every 6 (six) hours as needed for moderate pain.   lidocaine-prilocaine cream Commonly known as:  EMLA Apply 1 application topically as needed (when accessing port).   mirtazapine 15 MG  tablet Commonly known as:  REMERON TAKE 1 TABLET BY MOUTH AT BEDTIME      Allergies  Allergen Reactions  . Penicillins Hives    Has patient had a PCN reaction causing immediate rash, facial/tongue/throat swelling, SOB or lightheadedness with hypotension: Yes Has patient had a PCN reaction causing severe rash involving mucus membranes or skin necrosis: No Has patient had a PCN reaction that required hospitalization: No Has patient had a PCN reaction occurring within the last 10 years: No If all of the above answers are "NO", then may proceed with Cephalosporin use.       The results of significant diagnostics from this hospitalization (including imaging, microbiology, ancillary and laboratory) are listed below for reference.    Significant Diagnostic Studies: Dg Chest 2 View  Result Date: 10/26/2018 CLINICAL DATA:  Shortness of breath, dizziness and headache for 1 day. EXAM: CHEST - 2 VIEW COMPARISON:  PA and lateral chest 06/28/2018 and 12/11/2017. FINDINGS: Port-A-Cath is unchanged. Lungs are clear. Heart size is normal. There is no pneumothorax or pleural effusion. No acute or focal bony abnormality. IMPRESSION: No acute disease. Electronically Signed   By: Inge Rise M.D.   On: 10/26/2018 16:33   Ct Abdomen Pelvis W Contrast  Result Date: 11/01/2018 CLINICAL DATA:  62 year old male with a history of midline abdominal pain for 1 day. Diarrhea. History of prior obstructions. History of Crohn disease. EXAM: CT ABDOMEN AND PELVIS WITH CONTRAST TECHNIQUE: Multidetector CT imaging of the abdomen and pelvis was performed using the standard protocol following bolus administration of intravenous contrast. CONTRAST:  126m OMNIPAQUE IOHEXOL 300 MG/ML  SOLN COMPARISON:  CT 09/30/2018 FINDINGS: Lower chest: No acute abnormality. Hepatobiliary: Unremarkable appearance of liver. Unremarkable gallbladder. Pancreas: Unremarkable pancreas parenchyma. Redemonstration of pancreatic duct dilation  with no radiopaque stones. Spleen: Unremarkable spleen Adrenals/Urinary Tract: Unremarkable adrenal glands. Unremarkable appearance of the kidneys with no hydronephrosis. No nephrolithiasis. Urinary bladder unchanged from the prior. High density material layered in the right aspect of the urinary bladder, contralateral from the prior CT likely representing mobile stones. Stomach/Bowel: Small hiatal hernia.  Unremarkable stomach. Similar configuration of small bowel to the comparison CT scan with multiple dilated small bowel loops and associated air-fluid levels. There are hyperenhancing and decompressed small bowel loops in the low abdomen, just superior to the ostomy. Associated increased vasculature of the mesenteric arterial system/venous system, best appreciated on the coronal reformatted images and compatible with a comb sign. There are at least 2 sites of persistent narrowing of small bowel when compared to the CT dated 09/30/2018, suggesting chronic strictures and may be related to the appearance of obstructed bowel. Stoma of the right lower quadrant. Similar appearance of the hernia sac of the left spigelian region containing multiple loops of small bowel. None of these are significantly dilated. Vascular/Lymphatic: No significant vascular calcifications. Multiple lymph nodes of the retroperitoneum are again visualized, most prominent on the left. Multiple enlarged lymph nodes of  the right lower quadrant associated with the ileal colic mesentery. Reproductive: Mass of the right aspect of the pelvis, unchanged from prior measuring 2.7 cm x 5.5 cm on the current CT, relatively unchanged from prior. This is inseparable with the residual rectum in prostate. And is inseparable from the bladder base. Other: Surgical changes of the midline abdomen. Musculoskeletal: No acute displaced fracture. Redemonstration of sclerotic lesions of the bilateral iliac bones, pubic bone, bilateral ischial bones, right proximal  femur. IMPRESSION: Multiple dilated small bowel loops with air-fluid levels of the abdomen, suggesting at least partial small bowel obstruction. The etiology is favored to be a combination of both acute and chronic changes of inflammatory bowel disease in this patient with given history of Crohn's disease. There are at least 2 focal short-segment regions of narrowing of small bowel in the right lower quadrant adjacent to the ostomy, which may represent chronic strictures, given these are relatively similar when compared to the recent prior CT. Hyperenhancing bowel of the right lower quadrant with a vascular "comb sign" suggesting active Crohn's disease. Redemonstration of left spigelian hernia with multiple small bowel loops. Redemonstration of lymphadenopathy of the ileal colic mesentery and of retroperitoneal region. Redemonstration of pelvic sclerotic metastases and soft tissue in the right pelvis inseparable from prostate, rectum, and bladder base in this patient with given history of prostate carcinoma. Redemonstration of pancreatic ductal dilatation without associated inflammation. Electronically Signed   By: Corrie Mckusick D.O.   On: 11/01/2018 18:36    Microbiology: No results found for this or any previous visit (from the past 240 hour(s)).   Labs: Basic Metabolic Panel: Recent Labs  Lab 11/01/18 1245 11/02/18 0115 11/03/18 0113  NA 137 137 138  K 3.9 3.6 3.7  CL 107 107 111  CO2 23 25 23   GLUCOSE 85 82 90  BUN 10 6 <5*  CREATININE 1.01 1.04 1.09  CALCIUM 8.3* 7.6* 7.8*   Liver Function Tests: Recent Labs  Lab 11/01/18 1245  AST 27  ALT 17  ALKPHOS 45  BILITOT 0.9  PROT 7.4  ALBUMIN 3.3*   Recent Labs  Lab 11/01/18 1245  LIPASE 24   No results for input(s): AMMONIA in the last 168 hours. CBC: Recent Labs  Lab 11/01/18 1245 11/02/18 0115 11/03/18 0113  WBC 7.6 5.3 4.6  NEUTROABS 4.0  --   --   HGB 12.2* 10.3* 10.1*  HCT 39.5 33.6* 32.6*  MCV 100.0 98.8 97.6   PLT 268 292 290   Cardiac Enzymes: No results for input(s): CKTOTAL, CKMB, CKMBINDEX, TROPONINI in the last 168 hours. BNP: BNP (last 3 results) Recent Labs    06/27/18 0532  BNP 24.5    ProBNP (last 3 results) No results for input(s): PROBNP in the last 8760 hours.  CBG: No results for input(s): GLUCAP in the last 168 hours.     SignedRadene Gunning NP [facility] 11/03/2018, 1:08 PM

## 2018-11-04 DIAGNOSIS — Z933 Colostomy status: Secondary | ICD-10-CM | POA: Diagnosis not present

## 2018-11-04 DIAGNOSIS — K5090 Crohn's disease, unspecified, without complications: Secondary | ICD-10-CM | POA: Diagnosis not present

## 2018-11-09 ENCOUNTER — Encounter: Payer: Self-pay | Admitting: Internal Medicine

## 2018-11-09 ENCOUNTER — Ambulatory Visit (INDEPENDENT_AMBULATORY_CARE_PROVIDER_SITE_OTHER): Payer: Medicare Other | Admitting: Internal Medicine

## 2018-11-09 ENCOUNTER — Encounter: Payer: Self-pay | Admitting: *Deleted

## 2018-11-09 ENCOUNTER — Other Ambulatory Visit: Payer: Self-pay | Admitting: *Deleted

## 2018-11-09 ENCOUNTER — Telehealth: Payer: Self-pay | Admitting: *Deleted

## 2018-11-09 VITALS — BP 133/79 | HR 88 | Temp 97.3°F | Ht 64.5 in | Wt 146.4 lb

## 2018-11-09 DIAGNOSIS — K50812 Crohn's disease of both small and large intestine with intestinal obstruction: Secondary | ICD-10-CM | POA: Diagnosis not present

## 2018-11-09 DIAGNOSIS — K50819 Crohn's disease of both small and large intestine with unspecified complications: Secondary | ICD-10-CM | POA: Diagnosis not present

## 2018-11-09 DIAGNOSIS — K56609 Unspecified intestinal obstruction, unspecified as to partial versus complete obstruction: Secondary | ICD-10-CM

## 2018-11-09 NOTE — Telephone Encounter (Signed)
[place] website and no PA is required for CTE.

## 2018-11-09 NOTE — Progress Notes (Signed)
Primary Care Physician:  Alycia Rossetti, MD Primary Gastroenterologist:  Dr. Gala Romney  Pre-Procedure History & Physical: HPI:  Daniel Howard is a 62 y.o. male here for follow-up of complicated Crohn's disease  -  status post multiple surgeries including partial colectomy. He has had multiple admissions in the past year -  about 6;  with 3 in the past 4 months in [place] with partial small bowel obstructions which of resolved spontaneously.  He did see Dr. Redmond Pulling  - one of the surgeons down there in his last admission.  CT demonstrated significant small bowel in the spigelian hernia and also 2 areas of stricture near his ostomy.  Unfortunately, again indicated significant disease related to metastatic prostate cancer;   CT implied active Crohn's disease.  Current regimen includes Humira and Entocort 6 mg daily.  He has been compliant on his medical regimen.  The clinical picture is further complicated by metastatic prostate cancer.  Recently is done okay no nausea or vomiting.  No unusual abdominal pain.  Ostomy appears to be functioning well.  His weight is down only 4 pounds since July of this year.  Past Medical History:  Diagnosis Date  . Abnormal finding of biliary tract    MRCP shows pancreatic/biliary tract dilation. EUS 2010 confirmed dilation but no chronic pancreaitis or mass. Vascular ectasia crimpoing distal CBD.   [name] Anxiety   . 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 Duodenal ulcer 2010   nsaids  . History of blood transfusion 1992   "related to colon OR"  . Peristomal hernia   . Prostate cancer ([place]) 2018  . Small bowel  obstruction ([place]) 10/2017; 11/21/2017; 02/16/2018  . Spigelian hernia    bilateral    Past Surgical History:  Procedure Laterality Date  . APPENDECTOMY  1992   at time of exp laparotomy at which time he was noted to have fistulizing Crohn's rather than UC  . COLON SURGERY    . COLONOSCOPY [name] 08/23/2014   [id number] proctoscopy with possible fistulous opening in thebase of rectal/anal stump.    . COLOSTOMY  1992   transverse loop colostomy secondary to a stricture  . ESOPHAGOGASTRODUODENOSCOPY  05/2009   SLF: multiple antral erosions, large ulcer at ansatomosis (postsurgical changes at duodenal bulb and second portion of duodenum) BX c/x NSAIDS.  . EUS  10/04/2009   Dr. Estill Bakes dilated CBD and main pancreatic duct.  No pancreatic  . EXPLORATORY LAPAROTOMY  1992  . FLEXIBLE SIGMOIDOSCOPY  1988   Dr. Laural Golden- suggested rohn's disease but the biopsies were not collaborative.  [name] FLEXIBLE SIGMOIDOSCOPY [name] 09/08/2016   Procedure: FLEXIBLE SIGMOIDOSCOPY;  Surgeon: Wonda Horner, MD;  Location[facility] ENDOSCOPY;  Service: Gastroenterology;  Laterality: [name];  . HEMICOLOECTOMY W/ ANASTOMOSIS  1993   R- Dr.DeMason   . HERNIA REPAIR  1996   incarcerated periostial hernia with additional surgery in 1999  . IR FLUORO GUIDE PORT INSERTION RIGHT  04/03/2017  . IR US GUIDE VASC ACCESS RIGHT  04/03/2017    Prior to Admission medications   Medication Sig Start Date End Date Taking? Authorizing Provider  Adalimumab (HUMIRA PEN) 40 MG/0.8ML PNKT Inject 1 Syringe into the skin  once a week. Patient taking differently: Inject 1 Syringe into the skin every Wednesday.  09/01/18  Yes Annitta Needs, NP  budesonide (ENTOCORT EC) 3 MG 24 hr capsule Take 6 mg by mouth daily.   Yes [provider]  calcium carbonate (OS-CAL) 600 MG TABS tablet Take 1,200 mg by mouth daily with breakfast.   Yes [provider]  enzalutamide (XTANDI) 40 MG capsule Take 4 capsules (160 mg total) by mouth daily. 10/22/18   Yes [name], MD  ferrous sulfate 325 (65 FE) MG tablet Take 325 mg by mouth daily with breakfast.   Yes [provider]  HYDROcodone-acetaminophen (NORCO) 5-325 MG tablet Take 1 tablet by mouth every 6 (six) hours as needed for moderate pain. 10/28/18  Yes Susy Frizzle, MD  lidocaine-prilocaine (EMLA) cream Apply 1 application topically as needed (when accessing port).   Yes [provider]  mirtazapine (REMERON) 15 MG tablet TAKE 1 TABLET BY MOUTH AT BEDTIME Patient taking differently: Take 15 mg by mouth at bedtime.  09/27/18  Yes [place], [name], MD  Multiple Vitamins-Minerals (CENTRUM SILVER ADULT 50+) TABS Take 1 tablet by mouth daily.   Yes [provider]    Allergies as of 11/09/2018 - Review Complete 11/09/2018  Allergen Reaction Noted  . Penicillins Hives 01/25/2007    Family History  Problem Relation Age of Onset  . Cancer Father        prostate   . Prostate cancer Father   . Colon cancer Father 66  . Hypertension Sister   . Cancer Sister   . Depression Sister   . Breast cancer Sister   . COPD Sister   . Aneurysm Brother        deceased, brain aneurysm    Social History   Socioeconomic History  . Marital status: Married    Spouse name: Not on file  . Number of children: 90  . Years of education: Not on file  . Highest education level: Not on file  Occupational History  . Occupation: disabled    Fish farm manager: UNEMPLOYED  Social Needs  . Financial resource strain: Not on file  . Food insecurity:    Worry: Not on file    Inability: Not on file  . Transportation needs:    Medical: Not on file    Non-medical: Not on file  Tobacco Use  . Smoking status: Former Smoker    Packs/day: 0.75    Years: 32.00    Pack years: 24.00    Types: Cigarettes    Last attempt to quit: 12/21/2009    Years since quitting: 8.8  . Smokeless tobacco: Never Used  Substance and Sexual Activity  . Alcohol use: No    Alcohol/week: 0.0 standard  drinks    Comment: Former drinker  . Drug use: No  . Sexual activity: Not Currently    Partners: Female  Lifestyle  . Physical activity:    Days per week: Not on file    Minutes per session: Not on file  . Stress: Not on file  Relationships  . Social connections:    Talks on phone: Not on file    Gets together: Not on file    Attends religious service: Not on file    Active member of club or organization: Not on file    Attends meetings of clubs or organizations: Not on file    Relationship status: Not on file  . Intimate partner violence:    Fear  of current or ex partner: Not on file    Emotionally abused: Not on file    Physically abused: Not on file    Forced sexual activity: Not on file  Other Topics Concern  . Not on file  Social History Narrative  . Not on file    Review of Systems: See HPI, otherwise negative ROS  Physical Exam: BP 133/79   Pulse 88   Temp (!) 97.3 F (36.3 C) (Oral)   Ht 5' 4.5" (1.638 m)   Wt 146 lb 6.4 oz (66.4 kg)   BMI 24.74 kg/m  General:   Alert,   pleasant and cooperative in NAD -accompanied by spouse. Neck:  Supple; no masses or thyromegaly. No significant cervical adenopathy. Lungs:  Clear throughout to auscultation.   No wheezes, crackles, or rhonchi. No acute distress. Heart:  Regular rate and rhythm; no murmurs, clicks, rubs,  or gallops. Abdomen: Non-distended, multiple keloid scars, normal bowel sounds.  Ostomy looks good right lower quadrant.  Tissue pink.  Lower quadrant abdominal defect consistent with hernia.  No bowel within the sac.  Nontender. Pulses:  Normal pulses noted. Extremities:  Without clubbing or edema.  Impression/Plan: Difficult, challenging clinical scenario of Crohn's disease with recurrent small bowel obstruction becoming more frequent.  Suggestion of stricturing in 2 places near his ostomy.  He appears to have progression of disease in spite of being on a biologic agent.  I wonder if Humira it remains  efficacious for him.  In particular, I wonder about adequate drug levels and the possibility of antibodies developing.  Coexisting metastatic prostate cancer.  Recommendations: We will go ahead and check Humira antibody and drug levels.  We will get that blood drawn today or first thing tomorrow morning his next dose of Humira is tomorrow.  We will continue him on Entocort 6 mg daily.  Admonished him to stay on a low residue diet have given him information..   We will go ahead and proceed with a CTE to try to get a better look of his anatomy and assess for a Crohn's activity radiographically. Will Make further recommendations in the very near future.        Notice: This dictation was prepared with Dragon dictation along with smaller phrase technology. Any transcriptional errors that result from this process are unintentional and may not be corrected upon review.

## 2018-11-09 NOTE — Patient Instructions (Signed)
Information on a low residue diet  4-5 smaller meals daily  For now, Continue Humira and Entocort at current doses  Will do blood levels for Humira and check for antibodies  CTE to evaluate bowel stricturing seen on recent CT  Further recommendations to follow      Thank you for entrusting me with your care. I appreciate the opportunity to create valuable relationships with patients and family members. You may receive a questionnaire in the mail regarding your visit here today.  It would be appreciated if you would take the time to return it.  If you were not completely satisfied with your experience, I would love to discuss any concerns with you.   Bridgette Habermann, M.D. [facility]

## 2018-11-10 ENCOUNTER — Inpatient Hospital Stay (HOSPITAL_COMMUNITY)
Admission: EM | Admit: 2018-11-10 | Discharge: 2018-11-16 | DRG: 389 | Disposition: A | Discharge status: Home / Self Care | Payer: Medicare Other | Attending: Internal Medicine | Admitting: Internal Medicine

## 2018-11-10 ENCOUNTER — Encounter (HOSPITAL_COMMUNITY): Payer: Self-pay

## 2018-11-10 ENCOUNTER — Emergency Department (HOSPITAL_COMMUNITY): Payer: Medicare Other

## 2018-11-10 ENCOUNTER — Observation Stay (HOSPITAL_COMMUNITY): Payer: Medicare Other

## 2018-11-10 ENCOUNTER — Other Ambulatory Visit: Payer: Self-pay

## 2018-11-10 DIAGNOSIS — Z560 Unemployment, unspecified: Secondary | ICD-10-CM

## 2018-11-10 DIAGNOSIS — K5090 Crohn's disease, unspecified, without complications: Secondary | ICD-10-CM | POA: Diagnosis present

## 2018-11-10 DIAGNOSIS — R1084 Generalized abdominal pain: Secondary | ICD-10-CM

## 2018-11-10 DIAGNOSIS — Z79899 Other long term (current) drug therapy: Secondary | ICD-10-CM

## 2018-11-10 DIAGNOSIS — Z87891 Personal history of nicotine dependence: Secondary | ICD-10-CM | POA: Diagnosis not present

## 2018-11-10 DIAGNOSIS — D638 Anemia in other chronic diseases classified elsewhere: Secondary | ICD-10-CM | POA: Diagnosis present

## 2018-11-10 DIAGNOSIS — Z9221 Personal history of antineoplastic chemotherapy: Secondary | ICD-10-CM

## 2018-11-10 DIAGNOSIS — C7951 Secondary malignant neoplasm of bone: Secondary | ICD-10-CM | POA: Diagnosis not present

## 2018-11-10 DIAGNOSIS — Z9049 Acquired absence of other specified parts of digestive tract: Secondary | ICD-10-CM | POA: Diagnosis not present

## 2018-11-10 DIAGNOSIS — Z8042 Family history of malignant neoplasm of prostate: Secondary | ICD-10-CM

## 2018-11-10 DIAGNOSIS — K56609 Unspecified intestinal obstruction, unspecified as to partial versus complete obstruction: Secondary | ICD-10-CM

## 2018-11-10 DIAGNOSIS — Z933 Colostomy status: Secondary | ICD-10-CM

## 2018-11-10 DIAGNOSIS — K50819 Crohn's disease of both small and large intestine with unspecified complications: Secondary | ICD-10-CM | POA: Diagnosis present

## 2018-11-10 DIAGNOSIS — Z825 Family history of asthma and other chronic lower respiratory diseases: Secondary | ICD-10-CM

## 2018-11-10 DIAGNOSIS — Z932 Ileostomy status: Secondary | ICD-10-CM | POA: Diagnosis not present

## 2018-11-10 DIAGNOSIS — C61 Malignant neoplasm of prostate: Secondary | ICD-10-CM | POA: Diagnosis not present

## 2018-11-10 DIAGNOSIS — K56600 Partial intestinal obstruction, unspecified as to cause: Secondary | ICD-10-CM | POA: Diagnosis not present

## 2018-11-10 DIAGNOSIS — Z8249 Family history of ischemic heart disease and other diseases of the circulatory system: Secondary | ICD-10-CM

## 2018-11-10 DIAGNOSIS — Z880 Allergy status to penicillin: Secondary | ICD-10-CM | POA: Diagnosis not present

## 2018-11-10 DIAGNOSIS — K6389 Other specified diseases of intestine: Secondary | ICD-10-CM | POA: Diagnosis not present

## 2018-11-10 DIAGNOSIS — F419 Anxiety disorder, unspecified: Secondary | ICD-10-CM | POA: Diagnosis present

## 2018-11-10 HISTORY — DX: Unspecified intestinal obstruction, unspecified as to partial versus complete obstruction: K56.609

## 2018-11-10 LAB — CBC WITH DIFFERENTIAL/PLATELET
ABS IMMATURE GRANULOCYTES: 0.02 10*3/uL (ref 0.00–0.07)
Basophils Absolute: 0 10*3/uL (ref 0.0–0.1)
Basophils Relative: 1 %
Eosinophils Absolute: 0.1 10*3/uL (ref 0.0–0.5)
Eosinophils Relative: 1 %
HCT: 37.1 % — ABNORMAL LOW (ref 39.0–52.0)
Hemoglobin: 11.5 g/dL — ABNORMAL LOW (ref 13.0–17.0)
Immature Granulocytes: 0 %
LYMPHS ABS: 2.9 10*3/uL (ref 0.7–4.0)
Lymphocytes Relative: 40 %
MCH: 30.4 pg (ref 26.0–34.0)
MCHC: 31 g/dL (ref 30.0–36.0)
MCV: 98.1 fL (ref 80.0–100.0)
MONO ABS: 0.7 10*3/uL (ref 0.1–1.0)
MONOS PCT: 10 %
NEUTROS ABS: 3.5 10*3/uL (ref 1.7–7.7)
Neutrophils Relative %: 48 %
Platelets: 391 10*3/uL (ref 150–400)
RBC: 3.78 MIL/uL — ABNORMAL LOW (ref 4.22–5.81)
RDW: 13.4 % (ref 11.5–15.5)
WBC: 7.2 10*3/uL (ref 4.0–10.5)
nRBC: 0 % (ref 0.0–0.2)

## 2018-11-10 LAB — COMPREHENSIVE METABOLIC PANEL
ALT: 16 U/L (ref 0–44)
AST: 20 U/L (ref 15–41)
Albumin: 3.1 g/dL — ABNORMAL LOW (ref 3.5–5.0)
Alkaline Phosphatase: 50 U/L (ref 38–126)
Anion gap: 7 (ref 5–15)
BUN: 9 mg/dL (ref 6–20)
CO2: 25 mmol/L (ref 22–32)
CREATININE: 1.14 mg/dL (ref 0.61–1.24)
Calcium: 8.5 mg/dL — ABNORMAL LOW (ref 8.9–10.3)
Chloride: 104 mmol/L (ref 98–111)
Glucose, Bld: 95 mg/dL (ref 70–99)
Potassium: 3.7 mmol/L (ref 3.5–5.1)
Sodium: 136 mmol/L (ref 135–145)
Total Bilirubin: 0.8 mg/dL (ref 0.3–1.2)
Total Protein: 7 g/dL (ref 6.5–8.1)

## 2018-11-10 LAB — LIPASE, BLOOD: LIPASE: 25 U/L (ref 11–51)

## 2018-11-10 LAB — I-STAT CG4 LACTIC ACID, ED: Lactic Acid, Venous: 1 mmol/L (ref 0.5–1.9)

## 2018-11-10 MED ORDER — ADULT MULTIVITAMIN W/MINERALS CH
ORAL_TABLET | Freq: Every day | ORAL | Status: DC
  Administered 2018-11-11 – 2018-11-13 (×2): 1 via ORAL
  Filled 2018-11-10 (×3): qty 1

## 2018-11-10 MED ORDER — MORPHINE SULFATE (PF) 4 MG/ML IV SOLN
4.0000 mg | Freq: Once | INTRAVENOUS | Status: AC
  Administered 2018-11-10: 4 mg via INTRAVENOUS
  Filled 2018-11-10: qty 1

## 2018-11-10 MED ORDER — ENZALUTAMIDE 40 MG PO CAPS: 160.0000 mg | ORAL_CAPSULE | Freq: Every day | ORAL | Status: DC

## 2018-11-10 MED ORDER — MIRTAZAPINE 15 MG PO TABS
15.0000 mg | ORAL_TABLET | Freq: Every day | ORAL | Status: DC
  Administered 2018-11-10 – 2018-11-15 (×6): 15 mg via ORAL
  Filled 2018-11-10 (×6): qty 1

## 2018-11-10 MED ORDER — HYDROCODONE-ACETAMINOPHEN 5-325 MG PO TABS
1.0000 | ORAL_TABLET | Freq: Four times a day (QID) | ORAL | Status: DC | PRN
  Administered 2018-11-10 – 2018-11-12 (×3): 1 via ORAL
  Filled 2018-11-10 (×4): qty 1

## 2018-11-10 MED ORDER — SODIUM CHLORIDE 0.9 % IV BOLUS
1000.0000 mL | Freq: Once | INTRAVENOUS | Status: AC
  Administered 2018-11-10: 1000 mL via INTRAVENOUS

## 2018-11-10 MED ORDER — ADALIMUMAB 40 MG/0.8ML ~~LOC~~ AJKT: 1.0000 | AUTO-INJECTOR | SUBCUTANEOUS | Status: DC

## 2018-11-10 MED ORDER — IOHEXOL 300 MG/ML  SOLN
100.0000 mL | Freq: Once | INTRAMUSCULAR | Status: AC | PRN
  Administered 2018-11-10: 100 mL via INTRAVENOUS

## 2018-11-10 MED ORDER — BUDESONIDE 3 MG PO CPEP
6.0000 mg | ORAL_CAPSULE | Freq: Every day | ORAL | Status: DC
  Administered 2018-11-10 – 2018-11-16 (×6): 6 mg via ORAL
  Filled 2018-11-10 (×7): qty 2

## 2018-11-10 MED ORDER — FERROUS SULFATE 325 (65 FE) MG PO TABS
325.0000 mg | ORAL_TABLET | Freq: Every day | ORAL | Status: DC
  Administered 2018-11-11 – 2018-11-13 (×2): 325 mg via ORAL
  Filled 2018-11-10 (×3): qty 1

## 2018-11-10 MED ORDER — ENOXAPARIN SODIUM 40 MG/0.4ML ~~LOC~~ SOLN
40.0000 mg | SUBCUTANEOUS | Status: DC
  Administered 2018-11-12 – 2018-11-15 (×4): 40 mg via SUBCUTANEOUS
  Filled 2018-11-10 (×4): qty 0.4

## 2018-11-10 MED ORDER — SODIUM CHLORIDE 0.9 % IV SOLN
INTRAVENOUS | Status: DC
  Administered 2018-11-10 – 2018-11-16 (×13): via INTRAVENOUS

## 2018-11-10 MED ORDER — CALCIUM CARBONATE 1250 (500 CA) MG PO TABS
1.0000 | ORAL_TABLET | Freq: Every day | ORAL | Status: DC
  Administered 2018-11-11 – 2018-11-13 (×2): 500 mg via ORAL
  Filled 2018-11-10 (×3): qty 1

## 2018-11-10 MED ORDER — ENZALUTAMIDE 40 MG PO CAPS
160.0000 mg | ORAL_CAPSULE | Freq: Every day | ORAL | Status: DC
  Administered 2018-11-11 – 2018-11-16 (×5): 160 mg via ORAL
  Filled 2018-11-10 (×5): qty 4

## 2018-11-10 MED ORDER — MORPHINE SULFATE (PF) 4 MG/ML IV SOLN
4.0000 mg | INTRAVENOUS | Status: DC | PRN
  Administered 2018-11-10 – 2018-11-12 (×7): 4 mg via INTRAVENOUS
  Filled 2018-11-10 (×8): qty 1

## 2018-11-10 NOTE — ED Notes (Signed)
ED Provider at bedside. 

## 2018-11-10 NOTE — Progress Notes (Signed)
[facility] aware of Mr Steiner being re-admitted. Repeat DG Abd in AM and we will follow up on the patient tomorrow.   Obie Dredge, PA-C [facility] Pager: [phone]

## 2018-11-10 NOTE — Progress Notes (Signed)
Called xray to ask about portable. They said it is not on there work list but they could see it somewhere in the chart. They suggested I release the order, but I do not have any orders to release.

## 2018-11-10 NOTE — ED Triage Notes (Addendum)
Pt to ER for evaluation of centralized abdominal pain that started yesterday evening. Pt has ileostomy. Hx of same, pt endorses feels similar to when he had a small bowel obstruction. Pt denies N/V. Pt saw GI yesterday. Pt was told to come back to ER if having similar pain. Hx crohns.

## 2018-11-10 NOTE — ED Notes (Signed)
Pt returned from CT °

## 2018-11-10 NOTE — H&P (Signed)
History and Physical    Daniel Howard DDU:[id number] DOB: 25-Mar-1956 DOA: 11/10/2018  PCP: Alycia Rossetti, MD Consultants:  None Patient coming from: home- lives with  Chief Complaint: abdominal pain  HPI: Daniel Howard is a 62 y.o. male with medical history significant for prostate cancer, recurrent and castration-resistant now, Crohn's disease status post colostomy, recurrent small bowel obstructions status post multiple abdominal surgeries and recent admission for partial small bowel obstruction last week who presents with worsening abdominal pain and decreased flatus.  At his presentation to the hospital last week he had complained of abdominal pain, diarrhea and increased ostomy output.  CT at that time showed partial small bowel obstruction with possible active Crohn's disease.  He was evaluated by general surgery who felt that he did not have a surgical abdomen and thought he had a possible Crohn's flare.  They also questioned adhesions versus chronic stricture.  Gastroenterology was consulted who felt the patient could follow-up as an outpatient with Dr. Gala Romney in [place].  After bowel rest in the hospital symptoms resolved and he was tolerating a full soft diet discharge with no abdominal pain.  He had a follow-up appointment with Dr. Gala Romney yesterday and the plan was to get CTE to evaluate possible bowel stricture seen on recent CT. however, since his appointment, his abdominal pain has returned which he reports is a 10 out of 10 at its worst.  He is also had increased ostomy output and increased bloating and dyspepsia since last night.  He denies any bloody ostomy output.  He has had no fevers or chills.  Prior to admission last week he had been admitted July 12 and October 10 for small bowel obstructions.  He is currently on Entocort and Humira for his Crohn's disease.  He was started on Xtandi earlier in November for recurrent prostate cancer.   ED Course: He was afebrile with  stable vital signs.  CT was obtained which showed continued partial small bowel obstruction with no evidence of perforation or abscess.  Labs were relatively unremarkable.  Given IV morphine with partial relief.  Plan had been to discharge patient with pain medication but patient reported he was still having severe pain.  Patient was concerned about going home with his recurrent obstruction.  He was given additional dose of IV morphine and hospitalist service was called for admission observation.  Review of Systems: As per HPI; otherwise review of systems reviewed and negative.   Ambulatory Status:  Ambulates without assistance  Past Medical History:  Diagnosis Date  . Abnormal finding of biliary tract    MRCP shows pancreatic/biliary tract dilation. EUS 2010 confirmed dilation but no chronic pancreaitis or mass. Vascular ectasia crimpoing distal CBD.   [name] Anxiety   . 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 Duodenal ulcer 2010   nsaids  . History of blood transfusion 1992   "related to colon OR"  . Peristomal hernia   . Prostate cancer ([place]) 2018  . Small bowel obstruction ([place]) 10/2017; 11/21/2017; 02/16/2018  . Spigelian hernia    bilateral    Past Surgical History:  Procedure Laterality Date  . APPENDECTOMY  1992   at time of exp laparotomy  at which time he was noted to have fistulizing Crohn's rather than UC  . COLON SURGERY    . COLONOSCOPY [name] 08/23/2014   OIN:[facility] proctoscopy with possible fistulous opening in thebase of rectal/anal stump.    . COLOSTOMY  1992   transverse loop colostomy secondary to a stricture  .  ESOPHAGOGASTRODUODENOSCOPY  05/2009   SLF: multiple antral erosions, large ulcer at ansatomosis (postsurgical changes at duodenal bulb and second portion of duodenum) BX c/x NSAIDS.  . EUS  10/04/2009   Dr. Estill Bakes dilated CBD and main pancreatic duct.  No pancreatic  . EXPLORATORY LAPAROTOMY  1992  . FLEXIBLE SIGMOIDOSCOPY  1988   Dr. Laural Golden- suggested rohn's disease but the biopsies were not collaborative.  [name] FLEXIBLE SIGMOIDOSCOPY [name] 09/08/2016   Procedure: FLEXIBLE SIGMOIDOSCOPY;  Surgeon: Wonda Horner, MD;  Location: [facility] ENDOSCOPY;  Service: Gastroenterology;  Laterality: [name];  . HEMICOLOECTOMY W/ ANASTOMOSIS  1993   R- Dr.DeMason   . HERNIA REPAIR  1996   incarcerated periostial hernia with additional surgery in 1999  . IR FLUORO GUIDE PORT INSERTION RIGHT  04/03/2017  . IR US GUIDE VASC ACCESS RIGHT  04/03/2017    Social History   Socioeconomic History  . Marital status: Married    Spouse name: Not on file  . Number of children: 23  . Years of education: Not on file  . Highest education level: Not on file  Occupational History  . Occupation: disabled    Fish farm manager: UNEMPLOYED  Social Needs  . Financial resource strain: Not on file  . Food insecurity:    Worry: Not on file    Inability: Not on file  . Transportation needs:    Medical: Not on file    Non-medical: Not on file  Tobacco Use  . Smoking status: Former Smoker    Packs/day: 0.75    Years: 32.00    Pack years: 24.00    Types: Cigarettes    Last attempt to quit: 12/21/2009    Years since quitting: 8.8  . Smokeless tobacco: Never Used  Substance and Sexual Activity  . Alcohol use: No    Alcohol/week: 0.0 standard drinks    Comment: Former drinker  . Drug use: No  . Sexual activity: Not Currently    Partners: Female  Lifestyle  . Physical activity:    Days per week: Not on file    Minutes per session: Not on file  . Stress: Not on file  Relationships  . Social connections:    Talks on phone: Not  on file    Gets together: Not on file    Attends religious service: Not on file    Active member of club or organization: Not on file    Attends meetings of clubs or organizations: Not on file    Relationship status: Not on file  . Intimate partner violence:    Fear of current or ex partner: Not on file    Emotionally abused: Not on file    Physically abused: Not on file    Forced sexual activity: Not on file  Other Topics Concern  . Not on file  Social History Narrative  . Not on file    Allergies  Allergen Reactions  . Penicillins Hives    Has patient had a PCN reaction causing immediate rash, facial/tongue/throat swelling, SOB or lightheadedness with hypotension: Yes Has patient had a PCN reaction causing severe rash involving mucus membranes or skin necrosis: No Has patient had a PCN  reaction that required hospitalization: No Has patient had a PCN reaction occurring within the last 10 years: No If all of the above answers are "NO", then may proceed with Cephalosporin use.     Family History  Problem Relation Age of Onset  . Cancer Father        prostate   . Prostate cancer Father   . Colon cancer Father 74  . Hypertension Sister   . Cancer Sister   . Depression Sister   . Breast cancer Sister   . COPD Sister   . Aneurysm Brother        deceased, brain aneurysm    Prior to Admission medications   Medication Sig Start Date End Date Taking? Authorizing Provider  Adalimumab (HUMIRA PEN) 40 MG/0.8ML PNKT Inject 1 Syringe into the skin once a week. Patient taking differently: Inject 1 Syringe into the skin every Wednesday.  09/01/18  Yes Annitta Needs, NP  budesonide (ENTOCORT EC) 3 MG 24 hr capsule Take 6 mg by mouth daily.   Yes [provider]  calcium carbonate (OS-CAL) 600 MG TABS tablet Take 1,200 mg by mouth daily with breakfast.   Yes [provider]  enzalutamide (XTANDI) 40 MG capsule Take 4 capsules (160 mg total) by mouth daily. 10/22/18  Yes  [name], MD  ferrous sulfate 325 (65 FE) MG tablet Take 325 mg by mouth daily with breakfast.   Yes [provider]  lidocaine-prilocaine (EMLA) cream Apply 1 application topically as needed (when accessing port).   Yes [provider]  mirtazapine (REMERON) 15 MG tablet TAKE 1 TABLET BY MOUTH AT BEDTIME Patient taking differently: Take 15 mg by mouth at bedtime.  09/27/18  Yes [place], [name], MD  Multiple Vitamins-Minerals (CENTRUM SILVER ADULT 50+) TABS Take 1 tablet by mouth daily.   Yes [provider]  HYDROcodone-acetaminophen (NORCO) 5-325 MG tablet Take 1 tablet by mouth every 6 (six) hours as needed for moderate pain. Patient not taking: Reported on 11/10/2018 10/28/18   Susy Frizzle, MD    Physical Exam: Vitals:   11/10/18 1230 11/10/18 1245 11/10/18 1315 11/10/18 1407  BP:    133/84  Pulse: 80 80 81 83  Resp: 14 16 15 16   Temp:      TempSrc:      SpO2: 99% 100% 99% 99%  Weight:      Height:         . General: Appears calm and comfortable and is in NAD . Eyes:  PERRL, EOMI, normal lids, iris . ENT:  grossly normal hearing, lips & tongue, mmm . Neck:  supple, no lymphadenopathy . Cardiovascular:  nL S1, S2, normal rate, reg rhythm, no murmur. [name] Respiratory:   CTA bilaterally with no wheezes/rales/rhonchi.  Normal respiratory effort. . Abdomen:  soft, NT, ND, NABS.  Ostomy is in place with soft brown stool in the bag.  Patient localizes pain to underneath midline scar.  Tender to palpation in this area.  No rebound or guarding. . Back:   grossly normal alignment . Skin:  no rash or lesions seen on limited exam . Musculoskeletal:  grossly normal tone BUE/BLE, good ROM, no bony abnormality or obvious joint deformity . Lower extremities:  No LE edema.  Limited foot exam with no ulcerations.  2+ distal pulses. [name] Psychiatric:  grossly normal mood and affect, speech is hesitant and hard to understand at times, AOx3 . Neurologic:  CN 2-12  grossly intact, moves all extremities  in coordinated fashion, sensation intact, Patellar DTRs 2+ and symmetric    Radiological Exams on Admission: Ct Abdomen Pelvis W Contrast  Result Date: 11/10/2018 CLINICAL DATA:  Abdominal pain with decrease flatus. History of bowel obstruction and ostomy. History of Crohn's disease and prostate cancer. EXAM: CT ABDOMEN AND PELVIS WITH CONTRAST TECHNIQUE: Multidetector CT imaging of the abdomen and pelvis was performed using the standard protocol following bolus administration of intravenous contrast. CONTRAST:  161m OMNIPAQUE IOHEXOL 300 MG/ML  SOLN COMPARISON:  Radiographs 10/01/2018.  CT 09/30/2018 and 11/01/2018. FINDINGS: Lower chest: Mild dependent atelectasis at both lung bases. No significant pleural or pericardial effusion. Hepatobiliary: The liver is normal in density without focal abnormality. No evidence of gallstones, gallbladder wall thickening or biliary dilatation. Pancreas: Stable chronic pancreatic ductal dilatation. No evidence of pancreatic mass or surrounding inflammation. Spleen: There are stable hypervascular lesions anteriorly in the spleen, measuring 10 mm on image 13 and 11 mm on image 22/3. These are probably incidental hemangiomas. The spleen is normal in size. Adrenals/Urinary Tract: Both adrenal glands appear normal. Stable probable small cyst anteriorly in the mid left kidney. The kidneys otherwise appear normal without evidence of urinary tract calculus or hydronephrosis. Small bladder calculi are again noted. No significant bladder wall thickening. Stomach/Bowel: The stomach is decompressed. Again demonstrated are multiple moderately dilated loops of small bowel within the mid abdomen. The patient is status post partial small bowel and proximal colon resection. The distal small bowel appears decompressed proximal to a right lower quadrant ileostomy and show slightly less hyperenhancement compared with the most recent study. A large left  spigelian hernia containing multiple loops of small bowel is again noted, unchanged. The splenic flexure of the colon and proximal descending colon extend into this hernia. The distal colon is decompressed. Vascular/Lymphatic: Multiple enlarged retroperitoneal and mesenteric lymph nodes are again noted, similar to recent prior studies. There is a 14 mm left periaortic node on image 31/3. There is a right mesenteric node measuring 11 mm on image 44/3. No acute vascular findings. There is a retroaortic left renal vein. Reproductive: Right perirectal mass inseparable from the prostate gland is similar to the recent study, measuring approximately 6.6 x 2.7 cm on image 71/3. Other: No ascites, free air or peritoneal nodularity identified. Stable small right-sided lumbar hernia containing only fat. Musculoskeletal: Multiple sclerotic lesions are again noted within the pelvis consistent with osseous metastatic disease. There is a most prominent within the right iliac bone (2.2 cm on image 55/3) and in the right ischium. There is also a prominent lesion in the proximal right femur which appears stable. No lytic lesion or pathologic fracture identified. IMPRESSION: 1. Little change is seen from the most recent study of 9 days ago. There is chronic moderate small bowel dilatation status post ileostomy, distal small bowel and proximal colon resection. Findings remain consistent with a low-grade partial bowel obstruction. Small bowel mucosal hyperenhancement is slightly improved from the recent study. 2. 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 3. No evidence of intra-abdominal abscess. 4. Stable lymphadenopathy, likely reactive. 5. Stable right pelvic mass, likely treated prostate cancer. Multifocal osseous metastatic disease. Electronically Signed   By: WRichardean SaleM.D.   On: 11/10/2018 14:17    Labs on Admission: I have personally reviewed the available labs and imaging studies at the  time of the admission.  Pertinent labs:  Sodium 136 potassium 3.7 chloride 104 CO2 25 glucose 95 BUN 9 creatinine 1.14 which is slightly up  from baseline of 1-1.1, calcium 8.5 albumin 3.1 LFTs within normal limits Lactic acid 1.00 W BC 7.2 hemoglobin 11.5 platelets 391  EKG: None   Assessment/Plan Principal Problem:   Partial small bowel obstruction (HCC) Active Problems:   Crohn's disease of both small and large intestine with complication ([place])   Prostate cancer ([place])   Colostomy status ([place])   1. PSBO - 1. NPO except meds 2. IVF: NS at 100 cc/hr 3. Surgery consulted and will see pt in morning; repeat DG Abd in a.m. 4. Morphine PRN pain; pt states he needs increase in his home norco dose 5. Strict intake and output 2. Chron's with possible exacerbation - 1. Plan had been for pt to get CTE; he had regular CT with contrast today; would ascertain if GI wants CTE as well 2. Have paged [facility] twice; have not been called back. Would f/u in morning (vs calling Dr. Gala Romney) 3. Prostate CA - Continue PO antiandrogen therapy    DVT prophylaxis: lovenox Code Status:  Full - confirmed with patient/family Family Communication: none  Disposition Plan:  Home once clinically improved Consults called: Surgery, GI Admission status: It is my clinical opinion that referral for OBSERVATION is reasonable and necessary in this patient based on the above information provided. The aforementioned taken together are felt to place the patient at high risk for further clinical deterioration. However it is anticipated that the patient may be medically stable for discharge from the hospital within 24 to 48 hours.     Janora Norlander MD [facility]  If note is complete, please contact covering daytime or nighttime physician. [email or web address] Password [email or web address]  11/10/2018, 3:13 PM

## 2018-11-10 NOTE — ED Provider Notes (Signed)
[facility] EMERGENCY DEPARTMENT Provider Note   CSN: [id number] Arrival date & time: 11/10/18  1115     History   Chief Complaint Chief Complaint  Patient presents with  . Abdominal Pain    HPI Daniel Howard is a 62 y.o. male.  The history is provided by the patient and medical records.  Abdominal Pain   This is a recurrent problem. The current episode started yesterday. The problem occurs constantly. The problem has not changed since onset.The pain is associated with an unknown factor. The pain is located in the generalized abdominal region. The quality of the pain is aching and sharp. The pain is at a severity of 6/10. The pain is moderate. Associated symptoms include belching. Pertinent negatives include fever, diarrhea, flatus (decreased), nausea, vomiting, constipation, frequency and headaches. The symptoms are aggravated by palpation and eating. Nothing relieves the symptoms. Past workup includes surgery. His past medical history is significant for Crohn's disease.    Past Medical History:  Diagnosis Date  . Abnormal finding of biliary tract    MRCP shows pancreatic/biliary tract dilation. EUS 2010 confirmed dilation but no chronic pancreaitis or mass. Vascular ectasia crimpoing distal CBD.   [name] Anxiety   . 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 Duodenal ulcer 2010   nsaids  . History of blood transfusion 1992   "related to colon OR"  . Peristomal hernia   . Prostate cancer ([place]) 2018  . Small bowel obstruction ([place]) 10/2017; 11/21/2017; 02/16/2018  . Spigelian hernia    bilateral    Patient Active Problem  List   Diagnosis Date Noted  . Partial small bowel obstruction ([place]) 11/01/2018  . SBO (small bowel obstruction) ([place]) 09/30/2018  . Iron deficiency anemia 07/03/2018  . 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 Nausea without vomiting 11/10/2017  . Incisional hernia 09/22/2017  . Abdominal pain of multiple sites 09/22/2017  . Goals of care, counseling/discussion 04/02/2017  . Prostate cancer ([place]) 04/02/2017  . Elevated PSA 03/04/2017  . Osteopenia 11/03/2016  . Pelvic mass in male 09/04/2016  . 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 Exacerbation of Crohn's disease ([place]) 09/04/2016  . Protein-calorie malnutrition, severe ([place]) 07/27/2014  . Loss of weight 06/09/2014  . Crohn's disease of both small and large intestine with complication ([place]) 12/14/8249  . Boils 02/11/2013  . Ventral hernia 12/11/2009  . Anemia 12/03/2009  . Regional enteritis/Crohn's 01/25/2007    Past Surgical History:  Procedure Laterality Date  . APPENDECTOMY  1992   at time of exp laparotomy at which time he was noted to have fistulizing Crohn's rather than UC  . COLON SURGERY    . COLONOSCOPY [name] 08/23/2014   IBB:CWUGQBV proctoscopy with possible fistulous opening in thebase of rectal/anal stump.    . COLOSTOMY  1992   transverse loop colostomy secondary to a stricture  . ESOPHAGOGASTRODUODENOSCOPY  05/2009   SLF: multiple antral erosions, large ulcer at ansatomosis (postsurgical changes at duodenal bulb and second portion of duodenum) BX c/x NSAIDS.  . EUS  10/04/2009   Dr. Estill Bakes dilated CBD and main pancreatic duct.  No pancreatic  . EXPLORATORY LAPAROTOMY  1992  . FLEXIBLE SIGMOIDOSCOPY  1988   Dr.  Rehman- suggested rohn's disease but the biopsies were not collaborative.  [name] FLEXIBLE SIGMOIDOSCOPY [name] 09/08/2016   Procedure: FLEXIBLE SIGMOIDOSCOPY;  Surgeon: Wonda Horner, MD;  Location: [facility] ENDOSCOPY;  Service: Gastroenterology;  Laterality: [name];  . HEMICOLOECTOMY W/ ANASTOMOSIS  1993   R- Dr.DeMason   .  HERNIA REPAIR  1996   incarcerated periostial hernia with additional surgery in 1999  . IR FLUORO GUIDE PORT INSERTION RIGHT  04/03/2017  . IR US GUIDE VASC ACCESS RIGHT  04/03/2017        Home Medications    Prior to Admission medications   Medication Sig Start Date End Date Taking? Authorizing Provider  Adalimumab (HUMIRA PEN) 40 MG/0.8ML PNKT Inject 1 Syringe into the skin once a week. Patient taking differently: Inject 1 Syringe into the skin every Wednesday.  09/01/18   Annitta Needs, NP  budesonide (ENTOCORT EC) 3 MG 24 hr capsule Take 6 mg by mouth daily.    [provider]  calcium carbonate (OS-CAL) 600 MG TABS tablet Take 1,200 mg by mouth daily with breakfast.    [provider]  enzalutamide (XTANDI) 40 MG capsule Take 4 capsules (160 mg total) by mouth daily. 10/22/18   [name], MD  ferrous sulfate 325 (65 FE) MG tablet Take 325 mg by mouth daily with breakfast.    [provider]  HYDROcodone-acetaminophen (NORCO) 5-325 MG tablet Take 1 tablet by mouth every 6 (six) hours as needed for moderate pain. 10/28/18   Susy Frizzle, MD  lidocaine-prilocaine (EMLA) cream Apply 1 application topically as needed (when accessing port).    [provider]  mirtazapine (REMERON) 15 MG tablet TAKE 1 TABLET BY MOUTH AT BEDTIME Patient taking differently: Take 15 mg by mouth at bedtime.  09/27/18   Wiota, [name], MD  Multiple Vitamins-Minerals (CENTRUM SILVER ADULT 50+) TABS Take 1 tablet by mouth daily.    [provider]    Family History Family History  Problem Relation Age of Onset  . Cancer Father        prostate   . Prostate cancer Father   . Colon cancer Father 57  . Hypertension Sister   . Cancer Sister   . Depression Sister   . Breast cancer Sister   . COPD Sister   . Aneurysm Brother        deceased, brain aneurysm    Social History Social History   Tobacco Use  . Smoking status: Former Smoker    Packs/day:  0.75    Years: 32.00    Pack years: 24.00    Types: Cigarettes    Last attempt to quit: 12/21/2009    Years since quitting: 8.8  . Smokeless tobacco: Never Used  Substance Use Topics  . Alcohol use: No    Alcohol/week: 0.0 standard drinks    Comment: Former drinker  . Drug use: No     Allergies   Penicillins   Review of Systems Review of Systems  Constitutional: Negative for chills, diaphoresis, fatigue and fever.  HENT: Negative for congestion and rhinorrhea.   Eyes: Negative for visual disturbance.  Respiratory: Negative for cough, chest tightness, shortness of breath and wheezing.   Cardiovascular: Negative for chest pain and palpitations.  Gastrointestinal: Positive for abdominal pain. Negative for constipation, diarrhea, flatus (decreased), nausea and vomiting.  Genitourinary: Negative for flank pain and frequency.  Musculoskeletal: Negative for back pain, neck pain and neck stiffness.  Skin: Negative for rash and wound.  Neurological:  Negative for light-headedness and headaches.  Psychiatric/Behavioral: Negative for agitation.  All other systems reviewed and are negative.    Physical Exam Updated Vital Signs BP 122/86 (BP Location: Right Arm)   Pulse 86   Temp 97.6 F (36.4 C) (Oral)   Resp 16   Ht 5' 4.5" (1.638 m)   Wt 64.9 kg   SpO2 100%   BMI 24.17 kg/m   Physical Exam  Constitutional: He is oriented to person, place, and time. He appears well-developed and well-nourished.  Non-toxic appearance. He does not appear ill. No distress.  HENT:  Head: Normocephalic.  Mouth/Throat: Oropharynx is clear and moist. No oropharyngeal exudate.  Eyes: Pupils are equal, round, and reactive to light. Conjunctivae and EOM are normal.  Neck: Normal range of motion.  Cardiovascular: Normal rate and intact distal pulses.  No murmur heard. Pulmonary/Chest: Breath sounds normal. No stridor. No respiratory distress. He has no wheezes. He has no rales. He exhibits no  tenderness.  Abdominal: Bowel sounds are normal. He exhibits no distension. There is generalized tenderness. There is no rigidity, no rebound, no guarding and no CVA tenderness.    Musculoskeletal: He exhibits no edema or tenderness.  Neurological: He is alert and oriented to person, place, and time. No sensory deficit. He exhibits normal muscle tone.  Skin: Skin is warm. Capillary refill takes less than 2 seconds. He is not diaphoretic. No erythema. No pallor.  Nursing note and vitals reviewed.    ED Treatments / Results  Labs (all labs ordered are listed, but only abnormal results are displayed) Labs Reviewed  CBC WITH DIFFERENTIAL/PLATELET - Abnormal; Notable for the following components:      Result Value   RBC 3.78 (*)    Hemoglobin 11.5 (*)    HCT 37.1 (*)    All other components within normal limits  COMPREHENSIVE METABOLIC PANEL - Abnormal; Notable for the following components:   Calcium 8.5 (*)    Albumin 3.1 (*)    All other components within normal limits  LIPASE, BLOOD  I-STAT CG4 LACTIC ACID, ED  I-STAT CG4 LACTIC ACID, ED    EKG None  Radiology Ct Abdomen Pelvis W Contrast  Result Date: 11/10/2018 CLINICAL DATA:  Abdominal pain with decrease flatus. History of bowel obstruction and ostomy. History of Crohn's disease and prostate cancer. EXAM: CT ABDOMEN AND PELVIS WITH CONTRAST TECHNIQUE: Multidetector CT imaging of the abdomen and pelvis was performed using the standard protocol following bolus administration of intravenous contrast. CONTRAST:  153m OMNIPAQUE IOHEXOL 300 MG/ML  SOLN COMPARISON:  Radiographs 10/01/2018.  CT 09/30/2018 and 11/01/2018. FINDINGS: Lower chest: Mild dependent atelectasis at both lung bases. No significant pleural or pericardial effusion. Hepatobiliary: The liver is normal in density without focal abnormality. No evidence of gallstones, gallbladder wall thickening or biliary dilatation. Pancreas: Stable chronic pancreatic ductal  dilatation. No evidence of pancreatic mass or surrounding inflammation. Spleen: There are stable hypervascular lesions anteriorly in the spleen, measuring 10 mm on image 13 and 11 mm on image 22/3. These are probably incidental hemangiomas. The spleen is normal in size. Adrenals/Urinary Tract: Both adrenal glands appear normal. Stable probable small cyst anteriorly in the mid left kidney. The kidneys otherwise appear normal without evidence of urinary tract calculus or hydronephrosis. Small bladder calculi are again noted. No significant bladder wall thickening. Stomach/Bowel: The stomach is decompressed. Again demonstrated are multiple moderately dilated loops of small bowel within the mid abdomen. The patient is status post partial small bowel and proximal colon  resection. The distal small bowel appears decompressed proximal to a right lower quadrant ileostomy and show slightly less hyperenhancement compared with the most recent study. A large left spigelian hernia containing multiple loops of small bowel is again noted, unchanged. The splenic flexure of the colon and proximal descending colon extend into this hernia. The distal colon is decompressed. Vascular/Lymphatic: Multiple enlarged retroperitoneal and mesenteric lymph nodes are again noted, similar to recent prior studies. There is a 14 mm left periaortic node on image 31/3. There is a right mesenteric node measuring 11 mm on image 44/3. No acute vascular findings. There is a retroaortic left renal vein. Reproductive: Right perirectal mass inseparable from the prostate gland is similar to the recent study, measuring approximately 6.6 x 2.7 cm on image 71/3. Other: No ascites, free air or peritoneal nodularity identified. Stable small right-sided lumbar hernia containing only fat. Musculoskeletal: Multiple sclerotic lesions are again noted within the pelvis consistent with osseous metastatic disease. There is a most prominent within the right iliac bone (2.2  cm on image 55/3) and in the right ischium. There is also a prominent lesion in the proximal right femur which appears stable. No lytic lesion or pathologic fracture identified. IMPRESSION: 1. Little change is seen from the most recent study of 9 days ago. There is chronic moderate small bowel dilatation status post ileostomy, distal small bowel and proximal colon resection. Findings remain consistent with a low-grade partial bowel obstruction. Small bowel mucosal hyperenhancement is slightly improved from the recent study. 2. 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 3. No evidence of intra-abdominal abscess. 4. Stable lymphadenopathy, likely reactive. 5. Stable right pelvic mass, likely treated prostate cancer. Multifocal osseous metastatic disease. Electronically Signed   By: Richardean Sale M.D.   On: 11/10/2018 14:17    Procedures Procedures (including critical care time)  Medications Ordered in ED Medications  Adalimumab PNKT 1 Syringe (has no administration in time range)  enzalutamide (XTANDI) capsule 160 mg (has no administration in time range)  HYDROcodone-acetaminophen (NORCO/VICODIN) 5-325 MG per tablet 1 tablet (has no administration in time range)  mirtazapine (REMERON) tablet 15 mg (has no administration in time range)  budesonide (ENTOCORT EC) 24 hr capsule 6 mg (has no administration in time range)  ferrous sulfate tablet 325 mg (has no administration in time range)  calcium carbonate (OS-CAL) tablet 1,200 mg (has no administration in time range)  CENTRUM SILVER ADULT 50+ TABS 1 tablet (has no administration in time range)  enoxaparin (LOVENOX) injection 40 mg (has no administration in time range)  0.9 %  sodium chloride infusion (has no administration in time range)  morphine 4 MG/ML injection 4 mg (4 mg Intravenous Given 11/10/18 1205)  sodium chloride 0.9 % bolus 1,000 mL (0 mLs Intravenous Stopped 11/10/18 1351)  iohexol (OMNIPAQUE) 300 MG/ML  solution 100 mL (100 mLs Intravenous Contrast Given 11/10/18 1338)  morphine 4 MG/ML injection 4 mg (4 mg Intravenous Given 11/10/18 1515)     Initial Impression / Assessment and Plan / ED Course  I have reviewed the triage vital signs and the nursing notes.  Pertinent labs & imaging results that were available during my care of the patient were reviewed by me and considered in my medical decision making (see chart for details).     Daniel Howard is a 62 y.o. male with a past medical history significant for prostate cancer, Crohn's disease, small bowel obstruction status post multiple abdominal surgeries and recent partial small bowel obstruction  last week who presents with recurrent abdominal pain and decreased flatus.  Patient reports that he was admitted last week for partial small bowel obstruction and was doing better.  He reports he was discharged home however he started having abdominal pain in the middle of his abdomen last night that feels similar to prior bowel obstruction.  Patient was told to come back if he had worsening pain.  Patient says that last night he started having onset of the discomfort.he reports he had some minimal nausea but no significant vomiting.  He reports he has not been eating or drinking today due to the discomfort.  On exam, abdomen is tender.  Patient has a palpable hernia on the left abdomen.  Multiple scar seen on the abdomen.  Ostomy in place with some stool in the bag.  Lungs were clear and chest was nontender.  CT scan was obtained showing continued partial small bowel obstruction.  No evidence of perforation or abscess seen.  Laboratory testing similar to prior.  Next  Patient says that he is still having severe pain, more pain medicine will be given.  Patient still is concerned about going home with his recurrent obstruction.    Hospitalist and will be called for readmission as he was just discharged several days ago from their service.   Final  Clinical Impressions(s) / ED Diagnoses   Final diagnoses:  Generalized abdominal pain  Partial small bowel obstruction [facility])    ED Discharge Orders    None       Clinical Impression: 1. Generalized abdominal pain   2. Partial small bowel obstruction ([place])   3. SBO (small bowel obstruction) ([place])     Disposition: Admit  This note was prepared with assistance of Dragon voice recognition software. Occasional wrong-word or sound-a-like substitutions may have occurred due to the inherent limitations of voice recognition software.     Tegeler, Gwenyth Allegra, MD 11/10/18 [phone]

## 2018-11-10 NOTE — Progress Notes (Signed)
Per Ovid Curd, pharmd, I took 22 Xtandi and the completed home med form to the pharm for verification and dispensing for pt since this drug is not available from our pharmacy.

## 2018-11-11 ENCOUNTER — Observation Stay (HOSPITAL_COMMUNITY): Payer: Medicare Other

## 2018-11-11 ENCOUNTER — Encounter (HOSPITAL_COMMUNITY): Payer: Self-pay | Admitting: Radiology

## 2018-11-11 ENCOUNTER — Other Ambulatory Visit: Payer: Self-pay | Admitting: Oncology

## 2018-11-11 ENCOUNTER — Inpatient Hospital Stay (HOSPITAL_COMMUNITY): Payer: Medicare Other

## 2018-11-11 DIAGNOSIS — C61 Malignant neoplasm of prostate: Secondary | ICD-10-CM

## 2018-11-11 DIAGNOSIS — Z87891 Personal history of nicotine dependence: Secondary | ICD-10-CM | POA: Diagnosis not present

## 2018-11-11 DIAGNOSIS — K56600 Partial intestinal obstruction, unspecified as to cause: Secondary | ICD-10-CM | POA: Diagnosis not present

## 2018-11-11 DIAGNOSIS — K56609 Unspecified intestinal obstruction, unspecified as to partial versus complete obstruction: Secondary | ICD-10-CM | POA: Diagnosis not present

## 2018-11-11 DIAGNOSIS — Z4682 Encounter for fitting and adjustment of non-vascular catheter: Secondary | ICD-10-CM | POA: Diagnosis not present

## 2018-11-11 DIAGNOSIS — Z9049 Acquired absence of other specified parts of digestive tract: Secondary | ICD-10-CM | POA: Diagnosis not present

## 2018-11-11 DIAGNOSIS — K50819 Crohn's disease of both small and large intestine with unspecified complications: Secondary | ICD-10-CM

## 2018-11-11 DIAGNOSIS — D638 Anemia in other chronic diseases classified elsewhere: Secondary | ICD-10-CM | POA: Diagnosis present

## 2018-11-11 DIAGNOSIS — Z932 Ileostomy status: Secondary | ICD-10-CM | POA: Diagnosis not present

## 2018-11-11 DIAGNOSIS — K5090 Crohn's disease, unspecified, without complications: Secondary | ICD-10-CM | POA: Diagnosis present

## 2018-11-11 DIAGNOSIS — Z79899 Other long term (current) drug therapy: Secondary | ICD-10-CM | POA: Diagnosis not present

## 2018-11-11 DIAGNOSIS — Z880 Allergy status to penicillin: Secondary | ICD-10-CM | POA: Diagnosis not present

## 2018-11-11 DIAGNOSIS — K6389 Other specified diseases of intestine: Secondary | ICD-10-CM | POA: Diagnosis not present

## 2018-11-11 DIAGNOSIS — Z825 Family history of asthma and other chronic lower respiratory diseases: Secondary | ICD-10-CM | POA: Diagnosis not present

## 2018-11-11 DIAGNOSIS — Z9221 Personal history of antineoplastic chemotherapy: Secondary | ICD-10-CM | POA: Diagnosis not present

## 2018-11-11 DIAGNOSIS — Z560 Unemployment, unspecified: Secondary | ICD-10-CM | POA: Diagnosis not present

## 2018-11-11 DIAGNOSIS — Z8042 Family history of malignant neoplasm of prostate: Secondary | ICD-10-CM | POA: Diagnosis not present

## 2018-11-11 DIAGNOSIS — F419 Anxiety disorder, unspecified: Secondary | ICD-10-CM | POA: Diagnosis present

## 2018-11-11 DIAGNOSIS — R1084 Generalized abdominal pain: Secondary | ICD-10-CM | POA: Diagnosis not present

## 2018-11-11 DIAGNOSIS — Z8249 Family history of ischemic heart disease and other diseases of the circulatory system: Secondary | ICD-10-CM | POA: Diagnosis not present

## 2018-11-11 LAB — CBC
HCT: 35.8 % — ABNORMAL LOW (ref 39.0–52.0)
Hemoglobin: 11.4 g/dL — ABNORMAL LOW (ref 13.0–17.0)
MCH: 31.1 pg (ref 26.0–34.0)
MCHC: 31.8 g/dL (ref 30.0–36.0)
MCV: 97.8 fL (ref 80.0–100.0)
Platelets: 333 10*3/uL (ref 150–400)
RBC: 3.66 MIL/uL — ABNORMAL LOW (ref 4.22–5.81)
RDW: 13.4 % (ref 11.5–15.5)
WBC: 8.2 10*3/uL (ref 4.0–10.5)
nRBC: 0 % (ref 0.0–0.2)

## 2018-11-11 LAB — BASIC METABOLIC PANEL
Anion gap: 8 (ref 5–15)
BUN: 9 mg/dL (ref 6–20)
CO2: 21 mmol/L — ABNORMAL LOW (ref 22–32)
Calcium: 7.7 mg/dL — ABNORMAL LOW (ref 8.9–10.3)
Chloride: 106 mmol/L (ref 98–111)
Creatinine, Ser: 1.16 mg/dL (ref 0.61–1.24)
GFR calc Af Amer: >60 mL/min (ref >60)
GFR calc non Af Amer: >60 mL/min (ref >60)
Glucose, Bld: 71 mg/dL (ref 70–99)
Potassium: 3.9 mmol/L (ref 3.5–5.1)
Sodium: 135 mmol/L (ref 135–145)

## 2018-11-11 MED ORDER — ONDANSETRON HCL 4 MG/2ML IJ SOLN
4.0000 mg | Freq: Four times a day (QID) | INTRAMUSCULAR | Status: DC | PRN
  Administered 2018-11-11 – 2018-11-14 (×4): 4 mg via INTRAVENOUS
  Filled 2018-11-11 (×4): qty 2

## 2018-11-11 MED ORDER — BARIUM SULFATE 0.1 % PO SUSP
ORAL | Status: AC
  Filled 2018-11-11: qty 3

## 2018-11-11 MED ORDER — IOHEXOL 300 MG/ML  SOLN
100.0000 mL | Freq: Once | INTRAMUSCULAR | Status: AC | PRN
  Administered 2018-11-11: 100 mL via INTRAVENOUS

## 2018-11-11 NOTE — Progress Notes (Signed)
Progress Note    Daniel Howard  WKM:[id number] DOB: 09-19-1956  DOA: 11/10/2018 PCP: Alycia Rossetti, MD    Brief Narrative:     Medical records reviewed and are as summarized below:  Daniel Howard is an 62 y.o. male with medical history significant for prostate cancer, recurrent and castration-resistant now, Crohn's disease status post colostomy, recurrentsmall bowel obstructions status post multiple abdominal surgeries and recent admission for partial small bowel obstruction last week who presents with worsening abdominal pain and decreased flatus.  At his presentation to the hospital last week he had complained of abdominal pain, diarrhea and increased ostomy output.  CT at that time showed partial small bowel obstruction with possible active Crohn's disease.  He was evaluated by general surgery who felt that he did not have a surgical abdomen and thought he had a possible Crohn's flare.  They also questioned adhesions versus chronic stricture.  Gastroenterology was consulted who felt the patient could follow-up as an outpatient with Dr. Gala Romney in [place].  After bowel rest in the hospital symptoms resolved and he was tolerating a full soft diet discharge with no abdominal pain.  He had a follow-up appointment with Dr. Gala Romney yesterday and the plan was to get CTE to evaluate possible bowel stricture seen on recent CT. however, since his appointment, his abdominal pain has returned which he reports is a 10 out of 10 at its worst.  He is also had increased ostomy output and increased bloating and dyspepsia since last night.  He denies any bloody ostomy output.  He has had no fevers or chills.  Prior to admission last week he had been admitted July 12 and October 10 for small bowel obstructions.  He is currently on Entocort and Humira for his Crohn's disease.  He was started on Xtandi earlier in November for recurrent prostate cancer.  Assessment/Plan:   Principal Problem:   Partial  small bowel obstruction (HCC) Active Problems:   Crohn's disease of both small and large intestine with complication (HCC)   Prostate cancer ([name])   Colostomy status ([place])  PSBO -  -x ray with some improvement  -advance diet to clears-- discussed with Brooke from General Surgery  -continue IVF: NS at 100 cc/hr  -pain is such that he is still requiring IV morphine (may need increased in home  norco upon discharge)  -ambulate patient  Chron's with possible exacerbation - -discussed with Dr. Gala Romney-- will get CT enterography while inpatient.  Patient has not yet gotten the requested labs of: humira drug level as well as antibody-- unable to order in hospital so will need to do outpatient prior to next humira dose -concern is that patient may need a different biologic  Prostate CA - Continue PO antiandrogen therapy  As patient needs continued IV pain medication as well as IVF as he is not able to eat/drink enough to stay hydrated, will change to inpatient for further work up/monitoring.    Family Communication/Anticipated D/C date and plan/Code Status   DVT prophylaxis: Lovenox ordered. Code Status: Full Code.  Family Communication: family at bedside Disposition Plan:    Medical Consultants:    GI (phone)  General surgery     Subjective:   C/o crampy abdominal pain  Objective:    Vitals:   11/10/18 1655 11/10/18 2228 11/11/18 0700 11/11/18 1200  BP: 134/85 125/76 125/76 136/78  Pulse: 80 88 88 86  Resp: 16  20 20   Temp: 98.4 F (36.9 C) 98.1  F (36.7 C) 99.1 F (37.3 C) 98.2 F (36.8 C)  TempSrc: Oral Oral Oral Oral  SpO2: 100% 98% 97% 98%  Weight:      Height:        Intake/Output Summary (Last 24 hours) at 11/11/2018 1627 Last data filed at 11/11/2018 0737 Gross per 24 hour  Intake 995.86 ml  Output 200 ml  Net 795.86 ml   Filed Weights   11/10/18 1120  Weight: 64.9 kg    Exam: In bed, uncomfortable appearing +BS, ileostomy pink and some  tenderness to palpation, many scars on abdomen No LE Edema, skin appears dry Alert and cooperative  Data Reviewed:   I have personally reviewed following labs and imaging studies:  Labs: Labs show the following:   Basic Metabolic Panel: Recent Labs  Lab 11/10/18 1200 11/11/18 0605  NA 136 135  K 3.7 3.9  CL 104 106  CO2 25 21*  GLUCOSE 95 71  BUN 9 9  CREATININE 1.14 1.16  CALCIUM 8.5* 7.7*   GFR Estimated Creatinine Clearance: 57.9 mL/min (by C-G formula based on SCr of 1.16 mg/dL). Liver Function Tests: Recent Labs  Lab 11/10/18 1200  AST 20  ALT 16  ALKPHOS 50  BILITOT 0.8  PROT 7.0  ALBUMIN 3.1*   Recent Labs  Lab 11/10/18 1200  LIPASE 25   No results for input(s): AMMONIA in the last 168 hours. Coagulation profile No results for input(s): INR, PROTIME in the last 168 hours.  CBC: Recent Labs  Lab 11/10/18 1200 11/11/18 0605  WBC 7.2 8.2  NEUTROABS 3.5  --   HGB 11.5* 11.4*  HCT 37.1* 35.8*  MCV 98.1 97.8  PLT 391 333   Cardiac Enzymes: No results for input(s): CKTOTAL, CKMB, CKMBINDEX, TROPONINI in the last 168 hours. BNP (last 3 results) No results for input(s): PROBNP in the last 8760 hours. CBG: No results for input(s): GLUCAP in the last 168 hours. D-Dimer: No results for input(s): DDIMER in the last 72 hours. Hgb A1c: No results for input(s): HGBA1C in the last 72 hours. Lipid Profile: No results for input(s): CHOL, HDL, LDLCALC, TRIG, CHOLHDL, LDLDIRECT in the last 72 hours. Thyroid function studies: No results for input(s): TSH, T4TOTAL, T3FREE, THYROIDAB in the last 72 hours.  Invalid input(s): FREET3 Anemia work up: No results for input(s): VITAMINB12, FOLATE, FERRITIN, TIBC, IRON, RETICCTPCT in the last 72 hours. Sepsis Labs: Recent Labs  Lab 11/10/18 1200 11/10/18 1212 11/11/18 0605  WBC 7.2  --  8.2  LATICACIDVEN  --  1.00  --     Microbiology No results found for this or any previous visit (from the past 240  hour(s)).  Procedures and diagnostic studies:  Ct Abdomen Pelvis W Contrast  Result Date: 11/10/2018 CLINICAL DATA:  Abdominal pain with decrease flatus. History of bowel obstruction and ostomy. History of Crohn's disease and prostate cancer. EXAM: CT ABDOMEN AND PELVIS WITH CONTRAST TECHNIQUE: Multidetector CT imaging of the abdomen and pelvis was performed using the standard protocol following bolus administration of intravenous contrast. CONTRAST:  153m OMNIPAQUE IOHEXOL 300 MG/ML  SOLN COMPARISON:  Radiographs 10/01/2018.  CT 09/30/2018 and 11/01/2018. FINDINGS: Lower chest: Mild dependent atelectasis at both lung bases. No significant pleural or pericardial effusion. Hepatobiliary: The liver is normal in density without focal abnormality. No evidence of gallstones, gallbladder wall thickening or biliary dilatation. Pancreas: Stable chronic pancreatic ductal dilatation. No evidence of pancreatic mass or surrounding inflammation. Spleen: There are stable hypervascular lesions anteriorly in the spleen,  measuring 10 mm on image 13 and 11 mm on image 22/3. These are probably incidental hemangiomas. The spleen is normal in size. Adrenals/Urinary Tract: Both adrenal glands appear normal. Stable probable small cyst anteriorly in the mid left kidney. The kidneys otherwise appear normal without evidence of urinary tract calculus or hydronephrosis. Small bladder calculi are again noted. No significant bladder wall thickening. Stomach/Bowel: The stomach is decompressed. Again demonstrated are multiple moderately dilated loops of small bowel within the mid abdomen. The patient is status post partial small bowel and proximal colon resection. The distal small bowel appears decompressed proximal to a right lower quadrant ileostomy and show slightly less hyperenhancement compared with the most recent study. A large left spigelian hernia containing multiple loops of small bowel is again noted, unchanged. The splenic  flexure of the colon and proximal descending colon extend into this hernia. The distal colon is decompressed. Vascular/Lymphatic: Multiple enlarged retroperitoneal and mesenteric lymph nodes are again noted, similar to recent prior studies. There is a 14 mm left periaortic node on image 31/3. There is a right mesenteric node measuring 11 mm on image 44/3. No acute vascular findings. There is a retroaortic left renal vein. Reproductive: Right perirectal mass inseparable from the prostate gland is similar to the recent study, measuring approximately 6.6 x 2.7 cm on image 71/3. Other: No ascites, free air or peritoneal nodularity identified. Stable small right-sided lumbar hernia containing only fat. Musculoskeletal: Multiple sclerotic lesions are again noted within the pelvis consistent with osseous metastatic disease. There is a most prominent within the right iliac bone (2.2 cm on image 55/3) and in the right ischium. There is also a prominent lesion in the proximal right femur which appears stable. No lytic lesion or pathologic fracture identified. IMPRESSION: 1. Little change is seen from the most recent study of 9 days ago. There is chronic moderate small bowel dilatation status post ileostomy, distal small bowel and proximal colon resection. Findings remain consistent with a low-grade partial bowel obstruction. Small bowel mucosal hyperenhancement is slightly improved from the recent study. 2. 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 3. No evidence of intra-abdominal abscess. 4. Stable lymphadenopathy, likely reactive. 5. Stable right pelvic mass, likely treated prostate cancer. Multifocal osseous metastatic disease. Electronically Signed   By: Richardean Sale M.D.   On: 11/10/2018 14:17   Dg Abd Portable 1v  Result Date: 11/11/2018 CLINICAL DATA:  Follow-up small bowel obstruction. EXAM: PORTABLE ABDOMEN - 1 VIEW COMPARISON:  11/10/2018 FINDINGS: Single view shows mild  persistent small bowel dilatation consistent with persistent but probably improved partial small bowel obstruction. No worsening based on this 1 projection. IMPRESSION: Improved radiographic appearance. Mildly prominent small bowel loops, apparently improved since yesterday. Findings consistent with improved partial small bowel obstruction. Electronically Signed   By: Nelson Chimes M.D.   On: 11/11/2018 09:21   Dg Abd Portable 1v  Result Date: 11/10/2018 CLINICAL DATA:  Abdominal pain.  History of small-bowel obstruction. EXAM: PORTABLE ABDOMEN - 1 VIEW COMPARISON:  CT abdomen and pelvis November 10, 2018 at 1344 hours FINDINGS: A few loops of gas distended small bowel in LEFT lower quadrant. Contrast in the urinary collecting system, no hydronephrosis. RIGHT lower abdominal herniorrhaphy. No intra-abdominal mass effect or pathologic calcifications. Soft tissue planes and included osseous structures are non suspicious. IMPRESSION: Non-specific bowel gas pattern. Contrast in the urinary collecting system. Electronically Signed   By: Elon Alas M.D.   On: 11/10/2018 22:24    Medications:   .  Adalimumab  1 Syringe Subcutaneous Q Wed  . budesonide  6 mg Oral Daily  . calcium carbonate  1 tablet Oral Q breakfast  . enoxaparin (LOVENOX) injection  40 mg Subcutaneous Q24H  . enzalutamide  160 mg Oral Daily  . ferrous sulfate  325 mg Oral Q breakfast  . mirtazapine  15 mg Oral QHS  . multivitamin with minerals   Oral Daily   Continuous Infusions: . sodium chloride 100 mL/hr at 11/11/18 1238     LOS: 0 days   Geradine Girt  [facility]   *Please refer to [email or web address], password [email or web address] to get updated schedule on who will round on this patient, as hospitalists switch teams weekly. If 7PM-7AM, please contact night-coverage at [email or web address], password [email or web address] for any overnight needs.  11/11/2018, 4:27 PM

## 2018-11-11 NOTE — Progress Notes (Signed)
Received patient from [facility]. Patient alert and oriented. Family at bedside. Patient oriented to bed controls and call bell. Report will be given to night shift RN.

## 2018-11-11 NOTE — Progress Notes (Signed)
[facility] Progress Note     Subjective: CC-  Patient with PMH Crohn's and prior h/o multiple abdominal surgeries and recurrent SBOs, who was re-admitted last night with increased abdominal pain. Main complaint this morning is that hydrocodone does not help his pain. Denies any n/v. States that his ostomy is putting out stool. States that he is hungry. WBC 8.2, afebrile  Objective: Vital signs in last 24 hours: Temp:  [97.6 F (36.4 C)-99.1 F (37.3 C)] 99.1 F (37.3 C) (11/21 0700) Pulse Rate:  [80-88] 88 (11/21 0700) Resp:  [14-20] 20 (11/21 0700) BP: (102-136)/(68-92) 125/76 (11/21 0700) SpO2:  [95 %-100 %] 97 % (11/21 0700) Weight:  [64.9 kg] 64.9 kg (11/20 1120) Last BM Date: 11/11/18  Intake/Output from previous day: 11/20 0701 - 11/21 0700 In: 1995.9 [I.V.:995.9; IV Piggyback:1000] Out: 100 [Urine:100] Intake/Output this shift: Total I/O In: -  Out: 200 [Urine:200]  PE: Gen:  Alert, NAD HEENT: EOM's intact, pupils equal and round Pulm:  effort normal Abd: soft, nontender, nondistended. +BS. RLQ ileostomy with liquid brown stool in pouch, no tenderness over chronically incarcerated LLQ hernia. Multiple surgical scars. Skin: no rashes noted, warm and dry  Lab Results:  Recent Labs    11/10/18 1200 11/11/18 0605  WBC 7.2 8.2  HGB 11.5* 11.4*  HCT 37.1* 35.8*  PLT 391 333   BMET Recent Labs    11/10/18 1200 11/11/18 0605  NA 136 135  K 3.7 3.9  CL 104 106  CO2 25 21*  GLUCOSE 95 71  BUN 9 9  CREATININE 1.14 1.16  CALCIUM 8.5* 7.7*   PT/INR No results for input(s): LABPROT, INR in the last 72 hours. CMP     Component Value Date/Time   NA 135 11/11/2018 0605   NA 137 11/20/2017 0931   K 3.9 11/11/2018 0605   K 3.3 (L) 11/20/2017 0931   CL 106 11/11/2018 0605   CO2 21 (L) 11/11/2018 0605   CO2 27 11/20/2017 0931   GLUCOSE 71 11/11/2018 0605   GLUCOSE 99 11/20/2017 0931   BUN 9 11/11/2018 0605   BUN 16.6 11/20/2017 0931   CREATININE 1.16 11/11/2018 0605   CREATININE 1.02 10/21/2018 0927   CREATININE 0.99 12/02/2017 1249   CREATININE 1.2 11/20/2017 0931   CALCIUM 7.7 (L) 11/11/2018 0605   CALCIUM 8.9 11/20/2017 0931   PROT 7.0 11/10/2018 1200   PROT 7.5 11/20/2017 0931   ALBUMIN 3.1 (L) 11/10/2018 1200   ALBUMIN 3.1 (L) 11/20/2017 0931   AST 20 11/10/2018 1200   AST 21 10/21/2018 0927   AST 38 (H) 11/20/2017 0931   ALT 16 11/10/2018 1200   ALT 31 10/21/2018 0927   ALT 45 11/20/2017 0931   ALKPHOS 50 11/10/2018 1200   ALKPHOS 62 11/20/2017 0931   BILITOT 0.8 11/10/2018 1200   BILITOT 0.5 10/21/2018 0927   BILITOT 0.66 11/20/2017 0931   GFRNONAA >60 11/11/2018 0605   GFRNONAA >60 10/21/2018 0927   GFRNONAA 52 (L) 02/11/2017 1640   GFRAA >60 11/11/2018 0605   GFRAA >60 10/21/2018 0927   GFRAA 60 02/11/2017 1640   Lipase     Component Value Date/Time   LIPASE 25 11/10/2018 1200       Studies/Results: Ct Abdomen Pelvis W Contrast  Result Date: 11/10/2018 CLINICAL DATA:  Abdominal pain with decrease flatus. History of bowel obstruction and ostomy. History of Crohn's disease and prostate cancer. EXAM: CT ABDOMEN AND PELVIS WITH CONTRAST TECHNIQUE: Multidetector CT imaging of the abdomen  and pelvis was performed using the standard protocol following bolus administration of intravenous contrast. CONTRAST:  183m OMNIPAQUE IOHEXOL 300 MG/ML  SOLN COMPARISON:  Radiographs 10/01/2018.  CT 09/30/2018 and 11/01/2018. FINDINGS: Lower chest: Mild dependent atelectasis at both lung bases. No significant pleural or pericardial effusion. Hepatobiliary: The liver is normal in density without focal abnormality. No evidence of gallstones, gallbladder wall thickening or biliary dilatation. Pancreas: Stable chronic pancreatic ductal dilatation. No evidence of pancreatic mass or surrounding inflammation. Spleen: There are stable hypervascular lesions anteriorly in the spleen, measuring 10 mm on image 13 and 11 mm on  image 22/3. These are probably incidental hemangiomas. The spleen is normal in size. Adrenals/Urinary Tract: Both adrenal glands appear normal. Stable probable small cyst anteriorly in the mid left kidney. The kidneys otherwise appear normal without evidence of urinary tract calculus or hydronephrosis. Small bladder calculi are again noted. No significant bladder wall thickening. Stomach/Bowel: The stomach is decompressed. Again demonstrated are multiple moderately dilated loops of small bowel within the mid abdomen. The patient is status post partial small bowel and proximal colon resection. The distal small bowel appears decompressed proximal to a right lower quadrant ileostomy and show slightly less hyperenhancement compared with the most recent study. A large left spigelian hernia containing multiple loops of small bowel is again noted, unchanged. The splenic flexure of the colon and proximal descending colon extend into this hernia. The distal colon is decompressed. Vascular/Lymphatic: Multiple enlarged retroperitoneal and mesenteric lymph nodes are again noted, similar to recent prior studies. There is a 14 mm left periaortic node on image 31/3. There is a right mesenteric node measuring 11 mm on image 44/3. No acute vascular findings. There is a retroaortic left renal vein. Reproductive: Right perirectal mass inseparable from the prostate gland is similar to the recent study, measuring approximately 6.6 x 2.7 cm on image 71/3. Other: No ascites, free air or peritoneal nodularity identified. Stable small right-sided lumbar hernia containing only fat. Musculoskeletal: Multiple sclerotic lesions are again noted within the pelvis consistent with osseous metastatic disease. There is a most prominent within the right iliac bone (2.2 cm on image 55/3) and in the right ischium. There is also a prominent lesion in the proximal right femur which appears stable. No lytic lesion or pathologic fracture identified.  IMPRESSION: 1. Little change is seen from the most recent study of 9 days ago. There is chronic moderate small bowel dilatation status post ileostomy, distal small bowel and proximal colon resection. Findings remain consistent with a low-grade partial bowel obstruction. Small bowel mucosal hyperenhancement is slightly improved from the recent study. 2. 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 3. No evidence of intra-abdominal abscess. 4. Stable lymphadenopathy, likely reactive. 5. Stable right pelvic mass, likely treated prostate cancer. Multifocal osseous metastatic disease. Electronically Signed   By: WRichardean SaleM.D.   On: 11/10/2018 14:17   Dg Abd Portable 1v  Result Date: 11/10/2018 CLINICAL DATA:  Abdominal pain.  History of small-bowel obstruction. EXAM: PORTABLE ABDOMEN - 1 VIEW COMPARISON:  CT abdomen and pelvis November 10, 2018 at 1344 hours FINDINGS: A few loops of gas distended small bowel in LEFT lower quadrant. Contrast in the urinary collecting system, no hydronephrosis. RIGHT lower abdominal herniorrhaphy. No intra-abdominal mass effect or pathologic calcifications. Soft tissue planes and included osseous structures are non suspicious. IMPRESSION: Non-specific bowel gas pattern. Contrast in the urinary collecting system. Electronically Signed   By: CElon AlasM.D.   On: 11/10/2018 22:24  Anti-infectives: Anti-infectives (From admission, onward)   None       Assessment/Plan Abdominal pain Crohn's disease on biologic therapy Met prostate cancer on chemo Multiple prior admissions for SBO Chronic incarcerated Incisional hernia  pSBO - recurrent. Adhesions vs chronic stricture? - CT scan 11/20 very similar to prior CT with chronic moderate small bowel dilatation status post ileostomy, distal small bowel and proximal colon resection; partial bowel obstruction; stable chronic left spigelian hernia  ID - none FEN - IVF, NPO VTE - SCDs,  lovenox Foley - none  Plan: Patient has had no n/v and he is having some bowel function. Check abdominal xray today. Keep NPO for now. Recommend GI consult.    LOS: 0 days    Wellington [place] , [facility] 11/11/2018, 7:55 AM Pager: [phone] Mon 7:00 am -11:30 AM Tues-Fri 7:00 am-4:30 pm Sat-Sun 7:00 am-11:30 am

## 2018-11-11 NOTE — Plan of Care (Signed)
  Problem: Safety: Goal: Ability to remain free from injury will improve Outcome: Progressing   Problem: Skin Integrity: Goal: Risk for impaired skin integrity will decrease Outcome: Progressing   

## 2018-11-12 ENCOUNTER — Inpatient Hospital Stay (HOSPITAL_COMMUNITY): Payer: Medicare Other

## 2018-11-12 LAB — BASIC METABOLIC PANEL
ANION GAP: 8 (ref 5–15)
BUN: 6 mg/dL (ref 6–20)
CHLORIDE: 106 mmol/L (ref 98–111)
CO2: 23 mmol/L (ref 22–32)
Calcium: 7.9 mg/dL — ABNORMAL LOW (ref 8.9–10.3)
Creatinine, Ser: 1.01 mg/dL (ref 0.61–1.24)
Glucose, Bld: 117 mg/dL — ABNORMAL HIGH (ref 70–99)
Potassium: 3.8 mmol/L (ref 3.5–5.1)
SODIUM: 137 mmol/L (ref 135–145)

## 2018-11-12 LAB — CBC
HCT: 34 % — ABNORMAL LOW (ref 39.0–52.0)
HEMOGLOBIN: 11.1 g/dL — AB (ref 13.0–17.0)
MCH: 31.2 pg (ref 26.0–34.0)
MCHC: 32.6 g/dL (ref 30.0–36.0)
MCV: 95.5 fL (ref 80.0–100.0)
NRBC: 0 % (ref 0.0–0.2)
PLATELETS: 378 10*3/uL (ref 150–400)
RBC: 3.56 MIL/uL — AB (ref 4.22–5.81)
RDW: 13.3 % (ref 11.5–15.5)
WBC: 6.1 10*3/uL (ref 4.0–10.5)

## 2018-11-12 MED ORDER — MORPHINE SULFATE (PF) 4 MG/ML IV SOLN
4.0000 mg | Freq: Once | INTRAVENOUS | Status: AC
  Administered 2018-11-12: 4 mg via INTRAVENOUS

## 2018-11-12 MED ORDER — MORPHINE SULFATE (PF) 4 MG/ML IV SOLN
4.0000 mg | INTRAVENOUS | Status: DC | PRN
  Administered 2018-11-12 – 2018-11-14 (×11): 4 mg via INTRAVENOUS
  Filled 2018-11-12 (×11): qty 1

## 2018-11-12 NOTE — Progress Notes (Signed)
PROGRESS NOTE    NACHMEN MANSEL  YHC:[id number] DOB: 1956/04/03 DOA: 11/10/2018 PCP: Alycia Rossetti, MD    Brief Narrative:  Daniel Howard is an 62 y.o. male with medical history significant forprostate cancer,recurrent and castration-resistant now, Crohn's diseasestatus post colostomy,recurrentsmall bowel obstructionsstatus post multiple abdominal surgeries and recentadmission forpartial small bowel obstruction last week who presents with worseningabdominal pain and decreased flatus.At his presentation to the hospital last week he had complained of abdominal pain, diarrhea and increased ostomy output. CT at that time showed partial small bowel obstruction with possible active Crohn's disease. He was evaluated by general surgery who felt that he did not have a surgical abdomen and thought he had a possible Crohn's flare. They also questionedadhesions versus chronic stricture. Gastroenterology was consulted who felt the patient could follow-up as an outpatient with Dr. Gala Romney in [place]. After bowel rest in the hospital symptoms resolved and he was tolerating a full soft diet discharge with no abdominal pain. He had a follow-up appointment withDr. Gala Romney yesterday and the plan was to get CTE to evaluate possiblebowel strictureseen on recent CT. however, since his appointment, his abdominal pain has returned which he reports is a 10 out of 10 at its worst.  Assessment & Plan:   Principal Problem:   Partial small bowel obstruction ([place]) Active Problems:   Crohn's disease of both small and large intestine with complication ([place])   Prostate cancer ([employer])   Colostomy status ([place])   SBO (small bowel obstruction) ([place])  Partial SBO: Possibly from adhesions.  Surgery consulted and NG tube placed.  Pain control, IV anti emetics. IV hydration.    Crohn's disease: Follow up with GI as outpatient.    Prostate cancer: Resume anti androgen therapy when able to take orally.        DVT prophylaxis: lovenox.  Code Status:  Full code Family Communication: family at bedside.  Disposition Plan: (PENDING clinical improvement.    Consultants:   Surgery.    Procedures: (NG tube placement.    Antimicrobials: none    Subjective: No chest pain. Still nauseated and in discomfort from abd pain and Ng tube.   Objective: Vitals:   11/11/18 1859 11/11/18 2035 11/12/18 0540 11/12/18 1323  BP: 134/84 130/80 135/83 (!) 147/90  Pulse: 88 87 76 87  Resp: 16 16 16 17   Temp: 99.5 F (37.5 C) 98.4 F (36.9 C) 97.7 F (36.5 C) 98.1 F (36.7 C)  TempSrc: Oral Oral Oral Oral  SpO2: 99% 97% 100% 99%  Weight:      Height:        Intake/Output Summary (Last 24 hours) at 11/12/2018 1742 Last data filed at 11/12/2018 0317 Gross per 24 hour  Intake 2129.91 ml  Output 600 ml  Net 1529.91 ml   Filed Weights   11/10/18 1120  Weight: 64.9 kg    Examination:  General exam: mild distress from abdominal pain and distention. NG tube in place and on suction.  Respiratory system: Clear to auscultation. Respiratory effort normal. Cardiovascular system: S1 & S2 heard, RRR. No JVD,  . No pedal edema. Gastrointestinal system: Abdomen is distended, tender but soft.  Central nervous system: Alert and oriented able to move all extremities.  Extremities: Symmetric 5 x 5 power. Skin: No rashes, lesions or ulcers      Data Reviewed: I have personally reviewed following labs and imaging studies  CBC: Recent Labs  Lab 11/10/18 1200 11/11/18 0605 11/12/18 0245  WBC 7.2 8.2 6.1  NEUTROABS  3.5  --   --   HGB 11.5* 11.4* 11.1*  HCT 37.1* 35.8* 34.0*  MCV 98.1 97.8 95.5  PLT 391 333 389   Basic Metabolic Panel: Recent Labs  Lab 11/10/18 1200 11/11/18 0605 11/12/18 0245  NA 136 135 137  K 3.7 3.9 3.8  CL 104 106 106  CO2 25 21* 23  GLUCOSE 95 71 117*  BUN 9 9 6   CREATININE 1.14 1.16 1.01  CALCIUM 8.5* 7.7* 7.9*   GFR: Estimated Creatinine Clearance:  66.4 mL/min (by C-G formula based on SCr of 1.01 mg/dL). Liver Function Tests: Recent Labs  Lab 11/10/18 1200  AST 20  ALT 16  ALKPHOS 50  BILITOT 0.8  PROT 7.0  ALBUMIN 3.1*   Recent Labs  Lab 11/10/18 1200  LIPASE 25   No results for input(s): AMMONIA in the last 168 hours. Coagulation Profile: No results for input(s): INR, PROTIME in the last 168 hours. Cardiac Enzymes: No results for input(s): CKTOTAL, CKMB, CKMBINDEX, TROPONINI in the last 168 hours. BNP (last 3 results) No results for input(s): PROBNP in the last 8760 hours. HbA1C: No results for input(s): HGBA1C in the last 72 hours. CBG: No results for input(s): GLUCAP in the last 168 hours. Lipid Profile: No results for input(s): CHOL, HDL, LDLCALC, TRIG, CHOLHDL, LDLDIRECT in the last 72 hours. Thyroid Function Tests: No results for input(s): TSH, T4TOTAL, FREET4, T3FREE, THYROIDAB in the last 72 hours. Anemia Panel: No results for input(s): VITAMINB12, FOLATE, FERRITIN, TIBC, IRON, RETICCTPCT in the last 72 hours. Sepsis Labs: Recent Labs  Lab 11/10/18 1212  LATICACIDVEN 1.00    No results found for this or any previous visit (from the past 240 hour(s)).       Radiology Studies: Dg Abd 1 View  Result Date: 11/12/2018 CLINICAL DATA:  Enteric tube placement, small-bowel obstruction EXAM: ABDOMEN - 1 VIEW COMPARISON:  CT abdomen/pelvis and abdominal radiograph from earlier today FINDINGS: Enteric tube terminates in proximal stomach. Moderately dilated small bowel loops throughout the abdomen, worsened from earlier today. No evidence of pneumatosis or pneumoperitoneum. Hernia repair mesh is noted in the right lower quadrant. No radiopaque nephrolithiasis. Excreted contrast is seen in the bladder. IMPRESSION: Enteric tube terminates in the proximal stomach. Moderately dilated small bowel loops throughout the abdomen, worsened compared to radiograph from earlier today, compatible with distal small bowel  obstruction. Electronically Signed   By: Ilona Sorrel M.D.   On: 11/12/2018 11:06   Ct Entero Abd/pelvis W Contast  Result Date: 11/12/2018 CLINICAL DATA:  Exacerbation of Crohn's disease. EXAM: CT ABDOMEN AND PELVIS WITH CONTRAST (ENTEROGRAPHY) TECHNIQUE: Multidetector CT of the abdomen and pelvis during bolus administration of intravenous contrast. Negative oral contrast was given. CONTRAST:  115m OMNIPAQUE IOHEXOL 300 MG/ML  SOLN COMPARISON:  11/10/2018 FINDINGS: Lower chest: Atelectasis in both lung bases. The esophagus is dilated and fluid-filled. This may be due to reflux or dysmotility. No esophageal wall thickening. Hepatobiliary: No masses or other significant abnormality. Pancreas: No mass, inflammatory changes, or other significant abnormality. Mildly dilated pancreatic duct, unchanged since prior study. Spleen: Spleen size is normal. Hyperenhancing focus in the inferior spleen measuring 10 mm diameter. Probable hemangioma. Adrenals/Urinary Tract: No adrenal gland nodules. Kidneys are symmetrical. Nephrograms are homogeneous. No hydronephrosis or hydroureter. Small stones layering in the bladder. No bladder wall thickening. No change. Stomach/Bowel: Stomach and small bowel are distended with fluid. Distal small bowel are decompressed. Some right lower quadrant small bowel loops demonstrate wall thickening and enhancement compatible  with history of Crohn disease. Previous subtotal colectomy with right lower quadrant ileostomy. Left lower quadrant spigelian abdominal wall hernia containing multiple small bowel loops and splenic flexure of colon. This may represent a point of obstruction. Rectosigmoid colon is decompressed. Vascular/Lymphatic: Normal caliber abdominal aorta. Moderate lymphadenopathy demonstrated in the upper abdomen, retroperitoneum, mesentery, and right lower quadrant. Lymph nodes are similar in appearance to previous study and are likely reactive. Reproductive: Asymmetric prostate or  right perirectal mass lesion with calcifications, extending to the base of the bladder and the right pelvic sidewall, measuring about 4 x 5.6 cm diameter. Similar appearance to previous study. This may represent a prostate mass. Other: No free air or free fluid in the abdomen. Right lumbar triangle hernia containing fat. Musculoskeletal: Degenerative changes in the spine. No destructive bone lesions. There are well-defined sclerotic lesions in the pelvis, right acetabulum, and right proximal femur possibly representing metastasis such as would be expected with prostate cancer. Correlation with PSA is recommended. IMPRESSION: 1. Esophagus and stomach are more distended today. Otherwise, no significant change since yesterday's study. 2. Diffuse dilatation of small bowel proximally with distal decompression. Left spigelian hernia containing small bowel and colon. This may form a point of obstruction. Inflammatory mucosal thickening intermittently in the small bowel consistent with history of Crohn disease. Multiple strictured segments are suggested. 3. Abdominal lymphadenopathy likely reactive related to history of Crohn disease. 4. No abscess or fistula demonstrated. 5. Asymmetric right perirectal/prostate mass. 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 6. Right lower quadrant ileostomy. 7. Right lumbar triangle hernia containing fat. Electronically Signed   By: Lucienne Capers M.D.   On: 11/12/2018 00:37   Dg Abd Portable 1v  Result Date: 11/11/2018 CLINICAL DATA:  Follow-up small bowel obstruction. EXAM: PORTABLE ABDOMEN - 1 VIEW COMPARISON:  11/10/2018 FINDINGS: Single view shows mild persistent small bowel dilatation consistent with persistent but probably improved partial small bowel obstruction. No worsening based on this 1 projection. IMPRESSION: Improved radiographic appearance. Mildly prominent small bowel loops, apparently improved since yesterday. Findings consistent with improved partial small bowel  obstruction. Electronically Signed   By: Nelson Chimes M.D.   On: 11/11/2018 09:21   Dg Abd Portable 1v  Result Date: 11/10/2018 CLINICAL DATA:  Abdominal pain.  History of small-bowel obstruction. EXAM: PORTABLE ABDOMEN - 1 VIEW COMPARISON:  CT abdomen and pelvis November 10, 2018 at 1344 hours FINDINGS: A few loops of gas distended small bowel in LEFT lower quadrant. Contrast in the urinary collecting system, no hydronephrosis. RIGHT lower abdominal herniorrhaphy. No intra-abdominal mass effect or pathologic calcifications. Soft tissue planes and included osseous structures are non suspicious. IMPRESSION: Non-specific bowel gas pattern. Contrast in the urinary collecting system. Electronically Signed   By: Elon Alas M.D.   On: 11/10/2018 22:24        Scheduled Meds: . Adalimumab  1 Syringe Subcutaneous Q Wed  . budesonide  6 mg Oral Daily  . calcium carbonate  1 tablet Oral Q breakfast  . enoxaparin (LOVENOX) injection  40 mg Subcutaneous Q24H  . enzalutamide  160 mg Oral Daily  . ferrous sulfate  325 mg Oral Q breakfast  . mirtazapine  15 mg Oral QHS  . multivitamin with minerals   Oral Daily   Continuous Infusions: . sodium chloride 100 mL/hr at 11/12/18 1108     LOS: 1 day    Time spent: 35 minutes    Hosie Poisson, MD [facility] Pager [phone]  If 7PM-7AM, please contact night-coverage [email or web address] Password [email or web address]  11/12/2018, 5:42 PM

## 2018-11-12 NOTE — Progress Notes (Signed)
[facility] Progress Note     Subjective: CC:  C/o abdominal discomfort and belching. Also c/o nausea and "spitting up"  Objective: Vital signs in last 24 hours: Temp:  [97.7 F (36.5 C)-99.5 F (37.5 C)] 97.7 F (36.5 C) (11/22 0540) Pulse Rate:  [76-91] 76 (11/22 0540) Resp:  [16-20] 16 (11/22 0540) BP: (130-136)/(78-84) 135/83 (11/22 0540) SpO2:  [97 %-100 %] 100 % (11/22 0540) Last BM Date: 11/11/18  Intake/Output from previous day: 11/21 0701 - 11/22 0700 In: 2129.9 [I.V.:2129.9] Out: 800 [Urine:800] Intake/Output this shift: No intake/output data recorded.  PE: Gen:  Alert, appears uncomfortable, non-toxic Card:  Regular rate and rhythm, pedal pulses 2+ BL Pulm:  Normal effort, clear to auscultation bilaterally Abd: firm and distended, mild ttp, large left spigelian hernia, hypoactive BS Skin: warm and dry, no rashes  Psych: A&Ox3   Lab Results:  Recent Labs    11/11/18 0605 11/12/18 0245  WBC 8.2 6.1  HGB 11.4* 11.1*  HCT 35.8* 34.0*  PLT 333 378   BMET Recent Labs    11/11/18 0605 11/12/18 0245  NA 135 137  K 3.9 3.8  CL 106 106  CO2 21* 23  GLUCOSE 71 117*  BUN 9 6  CREATININE 1.16 1.01  CALCIUM 7.7* 7.9*   PT/INR No results for input(s): LABPROT, INR in the last 72 hours. CMP     Component Value Date/Time   NA 137 11/12/2018 0245   NA 137 11/20/2017 0931   K 3.8 11/12/2018 0245   K 3.3 (L) 11/20/2017 0931   CL 106 11/12/2018 0245   CO2 23 11/12/2018 0245   CO2 27 11/20/2017 0931   GLUCOSE 117 (H) 11/12/2018 0245   GLUCOSE 99 11/20/2017 0931   BUN 6 11/12/2018 0245   BUN 16.6 11/20/2017 0931   CREATININE 1.01 11/12/2018 0245   CREATININE 1.02 10/21/2018 0927   CREATININE 0.99 12/02/2017 1249   CREATININE 1.2 11/20/2017 0931   CALCIUM 7.9 (L) 11/12/2018 0245   CALCIUM 8.9 11/20/2017 0931   PROT 7.0 11/10/2018 1200   PROT 7.5 11/20/2017 0931   ALBUMIN 3.1 (L) 11/10/2018 1200   ALBUMIN 3.1 (L) 11/20/2017 0931   AST 20 11/10/2018 1200   AST 21 10/21/2018 0927   AST 38 (H) 11/20/2017 0931   ALT 16 11/10/2018 1200   ALT 31 10/21/2018 0927   ALT 45 11/20/2017 0931   ALKPHOS 50 11/10/2018 1200   ALKPHOS 62 11/20/2017 0931   BILITOT 0.8 11/10/2018 1200   BILITOT 0.5 10/21/2018 0927   BILITOT 0.66 11/20/2017 0931   GFRNONAA >60 11/12/2018 0245   GFRNONAA >60 10/21/2018 0927   GFRNONAA 52 (L) 02/11/2017 1640   GFRAA >60 11/12/2018 0245   GFRAA >60 10/21/2018 0927   GFRAA 60 02/11/2017 1640   Lipase     Component Value Date/Time   LIPASE 25 11/10/2018 1200       Studies/Results: Ct Abdomen Pelvis W Contrast  Result Date: 11/10/2018 CLINICAL DATA:  Abdominal pain with decrease flatus. History of bowel obstruction and ostomy. History of Crohn's disease and prostate cancer. EXAM: CT ABDOMEN AND PELVIS WITH CONTRAST TECHNIQUE: Multidetector CT imaging of the abdomen and pelvis was performed using the standard protocol following bolus administration of intravenous contrast. CONTRAST:  170m OMNIPAQUE IOHEXOL 300 MG/ML  SOLN COMPARISON:  Radiographs 10/01/2018.  CT 09/30/2018 and 11/01/2018. FINDINGS: Lower chest: Mild dependent atelectasis at both lung bases. No significant pleural or pericardial effusion. Hepatobiliary: The liver is normal in density  without focal abnormality. No evidence of gallstones, gallbladder wall thickening or biliary dilatation. Pancreas: Stable chronic pancreatic ductal dilatation. No evidence of pancreatic mass or surrounding inflammation. Spleen: There are stable hypervascular lesions anteriorly in the spleen, measuring 10 mm on image 13 and 11 mm on image 22/3. These are probably incidental hemangiomas. The spleen is normal in size. Adrenals/Urinary Tract: Both adrenal glands appear normal. Stable probable small cyst anteriorly in the mid left kidney. The kidneys otherwise appear normal without evidence of urinary tract calculus or hydronephrosis. Small bladder calculi are  again noted. No significant bladder wall thickening. Stomach/Bowel: The stomach is decompressed. Again demonstrated are multiple moderately dilated loops of small bowel within the mid abdomen. The patient is status post partial small bowel and proximal colon resection. The distal small bowel appears decompressed proximal to a right lower quadrant ileostomy and show slightly less hyperenhancement compared with the most recent study. A large left spigelian hernia containing multiple loops of small bowel is again noted, unchanged. The splenic flexure of the colon and proximal descending colon extend into this hernia. The distal colon is decompressed. Vascular/Lymphatic: Multiple enlarged retroperitoneal and mesenteric lymph nodes are again noted, similar to recent prior studies. There is a 14 mm left periaortic node on image 31/3. There is a right mesenteric node measuring 11 mm on image 44/3. No acute vascular findings. There is a retroaortic left renal vein. Reproductive: Right perirectal mass inseparable from the prostate gland is similar to the recent study, measuring approximately 6.6 x 2.7 cm on image 71/3. Other: No ascites, free air or peritoneal nodularity identified. Stable small right-sided lumbar hernia containing only fat. Musculoskeletal: Multiple sclerotic lesions are again noted within the pelvis consistent with osseous metastatic disease. There is a most prominent within the right iliac bone (2.2 cm on image 55/3) and in the right ischium. There is also a prominent lesion in the proximal right femur which appears stable. No lytic lesion or pathologic fracture identified. IMPRESSION: 1. Little change is seen from the most recent study of 9 days ago. There is chronic moderate small bowel dilatation status post ileostomy, distal small bowel and proximal colon resection. Findings remain consistent with a low-grade partial bowel obstruction. Small bowel mucosal hyperenhancement is slightly improved from the  recent study. 2. 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 3. No evidence of intra-abdominal abscess. 4. Stable lymphadenopathy, likely reactive. 5. Stable right pelvic mass, likely treated prostate cancer. Multifocal osseous metastatic disease. Electronically Signed   By: Richardean Sale M.D.   On: 11/10/2018 14:17   Ct Entero Abd/pelvis W Contast  Result Date: 11/12/2018 CLINICAL DATA:  Exacerbation of Crohn's disease. EXAM: CT ABDOMEN AND PELVIS WITH CONTRAST (ENTEROGRAPHY) TECHNIQUE: Multidetector CT of the abdomen and pelvis during bolus administration of intravenous contrast. Negative oral contrast was given. CONTRAST:  155m OMNIPAQUE IOHEXOL 300 MG/ML  SOLN COMPARISON:  11/10/2018 FINDINGS: Lower chest: Atelectasis in both lung bases. The esophagus is dilated and fluid-filled. This may be due to reflux or dysmotility. No esophageal wall thickening. Hepatobiliary: No masses or other significant abnormality. Pancreas: No mass, inflammatory changes, or other significant abnormality. Mildly dilated pancreatic duct, unchanged since prior study. Spleen: Spleen size is normal. Hyperenhancing focus in the inferior spleen measuring 10 mm diameter. Probable hemangioma. Adrenals/Urinary Tract: No adrenal gland nodules. Kidneys are symmetrical. Nephrograms are homogeneous. No hydronephrosis or hydroureter. Small stones layering in the bladder. No bladder wall thickening. No change. Stomach/Bowel: Stomach and small bowel are distended with  fluid. Distal small bowel are decompressed. Some right lower quadrant small bowel loops demonstrate wall thickening and enhancement compatible with history of Crohn disease. Previous subtotal colectomy with right lower quadrant ileostomy. Left lower quadrant spigelian abdominal wall hernia containing multiple small bowel loops and splenic flexure of colon. This may represent a point of obstruction. Rectosigmoid colon is decompressed.  Vascular/Lymphatic: Normal caliber abdominal aorta. Moderate lymphadenopathy demonstrated in the upper abdomen, retroperitoneum, mesentery, and right lower quadrant. Lymph nodes are similar in appearance to previous study and are likely reactive. Reproductive: Asymmetric prostate or right perirectal mass lesion with calcifications, extending to the base of the bladder and the right pelvic sidewall, measuring about 4 x 5.6 cm diameter. Similar appearance to previous study. This may represent a prostate mass. Other: No free air or free fluid in the abdomen. Right lumbar triangle hernia containing fat. Musculoskeletal: Degenerative changes in the spine. No destructive bone lesions. There are well-defined sclerotic lesions in the pelvis, right acetabulum, and right proximal femur possibly representing metastasis such as would be expected with prostate cancer. Correlation with PSA is recommended. IMPRESSION: 1. Esophagus and stomach are more distended today. Otherwise, no significant change since yesterday's study. 2. Diffuse dilatation of small bowel proximally with distal decompression. Left spigelian hernia containing small bowel and colon. This may form a point of obstruction. Inflammatory mucosal thickening intermittently in the small bowel consistent with history of Crohn disease. Multiple strictured segments are suggested. 3. Abdominal lymphadenopathy likely reactive related to history of Crohn disease. 4. No abscess or fistula demonstrated. 5. Asymmetric right perirectal/prostate mass. 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 6. Right lower quadrant ileostomy. 7. Right lumbar triangle hernia containing fat. Electronically Signed   By: Lucienne Capers M.D.   On: 11/12/2018 00:37   Dg Abd Portable 1v  Result Date: 11/11/2018 CLINICAL DATA:  Follow-up small bowel obstruction. EXAM: PORTABLE ABDOMEN - 1 VIEW COMPARISON:  11/10/2018 FINDINGS: Single view shows mild persistent small bowel dilatation consistent  with persistent but probably improved partial small bowel obstruction. No worsening based on this 1 projection. IMPRESSION: Improved radiographic appearance. Mildly prominent small bowel loops, apparently improved since yesterday. Findings consistent with improved partial small bowel obstruction. Electronically Signed   By: Nelson Chimes M.D.   On: 11/11/2018 09:21   Dg Abd Portable 1v  Result Date: 11/10/2018 CLINICAL DATA:  Abdominal pain.  History of small-bowel obstruction. EXAM: PORTABLE ABDOMEN - 1 VIEW COMPARISON:  CT abdomen and pelvis November 10, 2018 at 1344 hours FINDINGS: A few loops of gas distended small bowel in LEFT lower quadrant. Contrast in the urinary collecting system, no hydronephrosis. RIGHT lower abdominal herniorrhaphy. No intra-abdominal mass effect or pathologic calcifications. Soft tissue planes and included osseous structures are non suspicious. IMPRESSION: Non-specific bowel gas pattern. Contrast in the urinary collecting system. Electronically Signed   By: Elon Alas M.D.   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 Adhesions vs chronic stricture vs left spigelian hernia  - CT scan 11/20 very similar to prior CT with chronic moderate small bowel dilatation status post ileostomy, distal small bowel and proximal colon resection; partial bowel obstruction; stable chronic left spigelian hernia  - CT entero 11/21 shows transition zone, possibly at the level of the left spigelia hernia, "Multiple strictured segments are suggested".  -  increased distention clinically and radiographically - place NG tube to LIWS  - OOB  ID - none FEN - IVF, NPO, NG tube to LIWS VTE - SCDs, lovenox Foley - none  Plan: Place NG tube,  repeat DG abd in AM.    LOS: 1 day    Obie Dredge, [facility] Pager: [phone]

## 2018-11-13 ENCOUNTER — Inpatient Hospital Stay (HOSPITAL_COMMUNITY): Payer: Medicare Other

## 2018-11-13 NOTE — Progress Notes (Signed)
Daniel Howard  HOZ:[id number] DOB: 1956-08-23 DOA: 11/10/2018 PCP: Alycia Rossetti, MD    Brief Narrative:  Daniel Howard is an 62 y.o. male with medical history significant forprostate cancer,recurrent and castration-resistant now, Crohn's diseasestatus post colostomy,recurrentsmall bowel obstructionsstatus post multiple abdominal surgeries and recentadmission forpartial small bowel obstruction last week who presents with worseningabdominal pain and decreased flatus.At his presentation to the hospital last week he had complained of abdominal pain, diarrhea and increased ostomy output. CT at that time showed partial small bowel obstruction with possible active Crohn's disease. He was evaluated by general surgery who felt that he did not have a surgical abdomen and thought he had a possible Crohn's flare. They also questionedadhesions versus chronic stricture. Gastroenterology was consulted who felt the patient could follow-up as an outpatient with Dr. Gala Romney in [employer]. After bowel rest in the hospital symptoms resolved and he was tolerating a full soft diet discharge with no abdominal pain. He had a follow-up appointment withDr. Gala Romney yesterday and the plan was to get CTE to evaluate possiblebowel strictureseen on recent CT. however, since his appointment, his abdominal pain has returned which he reports is a 10 out of 10 at its worst.   Patient was readmitted for partial SBO and an NG tube placed and surgery consulted.   Assessment & Plan:   Principal Problem:   Partial small bowel obstruction (HCC) Active Problems:   Crohn's disease of both small and large intestine with complication (HCC)   Prostate cancer ([place])   Colostomy status ([place])   SBO (small bowel obstruction) (HCC)  Recurrent partial SBO: Possibly from adhesions versus chronic stricture versus left spigelian hernia.  Surgery consulted and NG tube placed.  Pain control, IV anti  emetics. IV hydration.  On exam today patient's abdominal distention appears to have improved and he reports nausea has improved further management as per surgery   Crohn's disease: Follow up with GI as outpatient.    Prostate cancer: Resume anti androgen therapy when able to take orally.    Anemia of chronic disease  hemoglobin stable around 11    DVT prophylaxis: lovenox.  Code Status:  Full code Family Communication: family at bedside.  Disposition Plan: Ending clinical improvement and surgery recommendations   Consultants:   Surgery.    Procedures: NG tube placement.    Antimicrobials: none    Subjective: Abdominal distention and nausea has improved.  Is any vomiting.  Objective: Vitals:   11/12/18 1323 11/12/18 2202 11/13/18 0417 11/13/18 1400  BP: (!) 147/90 135/75 (!) 152/86 (!) 143/99  Pulse: 87 88 95 (!) 101  Resp: 17 16 14 18   Temp: 98.1 F (36.7 C) 99 F (37.2 C) 99.6 F (37.6 C) 99.1 F (37.3 C)  TempSrc: Oral Oral Oral Oral  SpO2: 99% 98% 97% 100%  Weight:      Height:        Intake/Output Summary (Last 24 hours) at 11/13/2018 1555 Last data filed at 11/13/2018 0900 Gross per 24 hour  Intake 2715.72 ml  Output 1400 ml  Net 1315.72 ml   Filed Weights   11/10/18 1120  Weight: 64.9 kg    Examination:  General exam: He appears comfortable this morning, NG tube in place and on suction.  Respiratory system: Clear to auscultation. Respiratory effort normal.  No wheezing or rhonchi Cardiovascular system: S1 & S2 heard, RRR. No JVD,  . No pedal edema. Gastrointestinal system: Abdomen is mildly distended, soft, generalized tenderness present  Central nervous system: Alert and oriented able to move all extremities.  Extremities: Symmetric 5 x 5 power. Skin: No rashes, lesions or ulcers      Data Reviewed: I have personally reviewed following labs and imaging studies  CBC: Recent Labs  Lab 11/10/18 1200 11/11/18 0605 11/12/18 0245    WBC 7.2 8.2 6.1  NEUTROABS 3.5  --   --   HGB 11.5* 11.4* 11.1*  HCT 37.1* 35.8* 34.0*  MCV 98.1 97.8 95.5  PLT 391 333 867   Basic Metabolic Panel: Recent Labs  Lab 11/10/18 1200 11/11/18 0605 11/12/18 0245  NA 136 135 137  K 3.7 3.9 3.8  CL 104 106 106  CO2 25 21* 23  GLUCOSE 95 71 117*  BUN 9 9 6   CREATININE 1.14 1.16 1.01  CALCIUM 8.5* 7.7* 7.9*   GFR: Estimated Creatinine Clearance: 66.4 mL/min (by C-G formula based on SCr of 1.01 mg/dL). Liver Function Tests: Recent Labs  Lab 11/10/18 1200  AST 20  ALT 16  ALKPHOS 50  BILITOT 0.8  PROT 7.0  ALBUMIN 3.1*   Recent Labs  Lab 11/10/18 1200  LIPASE 25   No results for input(s): AMMONIA in the last 168 hours. Coagulation Profile: No results for input(s): INR, PROTIME in the last 168 hours. Cardiac Enzymes: No results for input(s): CKTOTAL, CKMB, CKMBINDEX, TROPONINI in the last 168 hours. BNP (last 3 results) No results for input(s): PROBNP in the last 8760 hours. HbA1C: No results for input(s): HGBA1C in the last 72 hours. CBG: No results for input(s): GLUCAP in the last 168 hours. Lipid Profile: No results for input(s): CHOL, HDL, LDLCALC, TRIG, CHOLHDL, LDLDIRECT in the last 72 hours. Thyroid Function Tests: No results for input(s): TSH, T4TOTAL, FREET4, T3FREE, THYROIDAB in the last 72 hours. Anemia Panel: No results for input(s): VITAMINB12, FOLATE, FERRITIN, TIBC, IRON, RETICCTPCT in the last 72 hours. Sepsis Labs: Recent Labs  Lab 11/10/18 1212  LATICACIDVEN 1.00    No results found for this or any previous visit (from the past 240 hour(s)).       Radiology Studies: Dg Abd 1 View  Result Date: 11/12/2018 CLINICAL DATA:  Enteric tube placement, small-bowel obstruction EXAM: ABDOMEN - 1 VIEW COMPARISON:  CT abdomen/pelvis and abdominal radiograph from earlier today FINDINGS: Enteric tube terminates in proximal stomach. Moderately dilated small bowel loops throughout the abdomen,  worsened from earlier today. No evidence of pneumatosis or pneumoperitoneum. Hernia repair mesh is noted in the right lower quadrant. No radiopaque nephrolithiasis. Excreted contrast is seen in the bladder. IMPRESSION: Enteric tube terminates in the proximal stomach. Moderately dilated small bowel loops throughout the abdomen, worsened compared to radiograph from earlier today, compatible with distal small bowel obstruction. Electronically Signed   By: Ilona Sorrel M.D.   On: 11/12/2018 11:06   Ct Entero Abd/pelvis W Contast  Result Date: 11/12/2018 CLINICAL DATA:  Exacerbation of Crohn's disease. EXAM: CT ABDOMEN AND PELVIS WITH CONTRAST (ENTEROGRAPHY) TECHNIQUE: Multidetector CT of the abdomen and pelvis during bolus administration of intravenous contrast. Negative oral contrast was given. CONTRAST:  166m OMNIPAQUE IOHEXOL 300 MG/ML  SOLN COMPARISON:  11/10/2018 FINDINGS: Lower chest: Atelectasis in both lung bases. The esophagus is dilated and fluid-filled. This may be due to reflux or dysmotility. No esophageal wall thickening. Hepatobiliary: No masses or other significant abnormality. Pancreas: No mass, inflammatory changes, or other significant abnormality. Mildly dilated pancreatic duct, unchanged since prior study. Spleen: Spleen size is normal. Hyperenhancing focus in the inferior spleen measuring  10 mm diameter. Probable hemangioma. Adrenals/Urinary Tract: No adrenal gland nodules. Kidneys are symmetrical. Nephrograms are homogeneous. No hydronephrosis or hydroureter. Small stones layering in the bladder. No bladder wall thickening. No change. Stomach/Bowel: Stomach and small bowel are distended with fluid. Distal small bowel are decompressed. Some right lower quadrant small bowel loops demonstrate wall thickening and enhancement compatible with history of Crohn disease. Previous subtotal colectomy with right lower quadrant ileostomy. Left lower quadrant spigelian abdominal wall hernia containing  multiple small bowel loops and splenic flexure of colon. This may represent a point of obstruction. Rectosigmoid colon is decompressed. Vascular/Lymphatic: Normal caliber abdominal aorta. Moderate lymphadenopathy demonstrated in the upper abdomen, retroperitoneum, mesentery, and right lower quadrant. Lymph nodes are similar in appearance to previous study and are likely reactive. Reproductive: Asymmetric prostate or right perirectal mass lesion with calcifications, extending to the base of the bladder and the right pelvic sidewall, measuring about 4 x 5.6 cm diameter. Similar appearance to previous study. This may represent a prostate mass. Other: No free air or free fluid in the abdomen. Right lumbar triangle hernia containing fat. Musculoskeletal: Degenerative changes in the spine. No destructive bone lesions. There are well-defined sclerotic lesions in the pelvis, right acetabulum, and right proximal femur possibly representing metastasis such as would be expected with prostate cancer. Correlation with PSA is recommended. IMPRESSION: 1. Esophagus and stomach are more distended today. Otherwise, no significant change since yesterday's study. 2. Diffuse dilatation of small bowel proximally with distal decompression. Left spigelian hernia containing small bowel and colon. This may form a point of obstruction. Inflammatory mucosal thickening intermittently in the small bowel consistent with history of Crohn disease. Multiple strictured segments are suggested. 3. Abdominal lymphadenopathy likely reactive related to history of Crohn disease. 4. No abscess or fistula demonstrated. 5. Asymmetric right perirectal/prostate mass. 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 6. Right lower quadrant ileostomy. 7. Right lumbar triangle hernia containing fat. Electronically Signed   By: Lucienne Capers M.D.   On: 11/12/2018 00:37   Dg Abd Portable 1v  Result Date: 11/13/2018 CLINICAL DATA:  Followup small bowel obstruction.  EXAM: PORTABLE ABDOMEN - 1 VIEW COMPARISON:  11/12/2018 radiograph, 11/11/2018 CT FINDINGS: NG tube with tip overlying the proximal-mid stomach again noted. Distended small bowel loops have slightly decreased in caliber. No other significant changes identified. IMPRESSION: Decreased distension of small bowel loops without other significant change. Electronically Signed   By: Margarette Canada M.D.   On: 11/13/2018 08:09        Scheduled Meds: . Adalimumab  1 Syringe Subcutaneous Q Wed  . budesonide  6 mg Oral Daily  . enoxaparin (LOVENOX) injection  40 mg Subcutaneous Q24H  . enzalutamide  160 mg Oral Daily  . mirtazapine  15 mg Oral QHS   Continuous Infusions: . sodium chloride 100 mL/hr at 11/13/18 0502     LOS: 2 days    Time spent: 28 minutes    Hosie Poisson, MD [facility] Pager [phone]  If 7PM-7AM, please contact night-coverage [email or web address] Password [facility] 11/13/2018, 3:55 PM

## 2018-11-13 NOTE — Progress Notes (Signed)
Assessment & Plan: HD#4 - small bowel obstruction, recurrent  Adhesions vs chronic stricture vs left spigelian hernia  Crohn's disease on biologic therapy  NPO, NG place last PM  Will review AXR  Met prostate cancer on chemo Multiple prior admissions for SBO Chronic incarcerated Incisional hernia         Daniel Gemma, MD       [facility]       Office: [phone]   Chief Complaint: Recurrent small bowel obstruction, Crohn's disease  Subjective: Patient in bed, more comfortable since large volume out of NG tube overnight.  Small liquid succus in ostomy bag.  Objective: Vital signs in last 24 hours: Temp:  [98.1 F (36.7 C)-99.6 F (37.6 C)] 99.6 F (37.6 C) (11/23 0417) Pulse Rate:  [87-95] 95 (11/23 0417) Resp:  [14-17] 14 (11/23 0417) BP: (135-152)/(75-90) 152/86 (11/23 0417) SpO2:  [97 %-99 %] 97 % (11/23 0417) Last BM Date: 11/11/18  Intake/Output from previous day: 11/22 0701 - 11/23 0700 In: 2715.7 [P.O.:240; I.V.:2465.7; NG/GT:10] Out: 1400 [Urine:200; Emesis/NG output:1200] Intake/Output this shift: No intake/output data recorded.  Physical Exam: HEENT - sclerae clear, mucous membranes moist Neck - soft Chest - clear bilaterally Cor - RRR Abdomen - mild distension; complex surgical wounds/scars; ostomy RLQ with small thin succus Ext - no edema, non-tender Neuro - alert & oriented, no focal deficits  Lab Results:  Recent Labs    11/11/18 0605 11/12/18 0245  WBC 8.2 6.1  HGB 11.4* 11.1*  HCT 35.8* 34.0*  PLT 333 378   BMET Recent Labs    11/11/18 0605 11/12/18 0245  NA 135 137  K 3.9 3.8  CL 106 106  CO2 21* 23  GLUCOSE 71 117*  BUN 9 6  CREATININE 1.16 1.01  CALCIUM 7.7* 7.9*   PT/INR No results for input(s): LABPROT, INR in the last 72 hours. Comprehensive Metabolic Panel:    Component Value Date/Time   NA 137 11/12/2018 0245   NA 135 11/11/2018 0605   NA 137 11/20/2017 0931   NA 136 09/21/2017 0921    K 3.8 11/12/2018 0245   K 3.9 11/11/2018 0605   K 3.3 (L) 11/20/2017 0931   K 4.1 09/21/2017 0921   CL 106 11/12/2018 0245   CL 106 11/11/2018 0605   CO2 23 11/12/2018 0245   CO2 21 (L) 11/11/2018 0605   CO2 27 11/20/2017 0931   CO2 26 09/21/2017 0921   BUN 6 11/12/2018 0245   BUN 9 11/11/2018 0605   BUN 16.6 11/20/2017 0931   BUN 15.1 09/21/2017 0921   CREATININE 1.01 11/12/2018 0245   CREATININE 1.16 11/11/2018 0605   CREATININE 1.02 10/21/2018 0927   CREATININE 1.01 07/13/2018 1216   CREATININE 0.99 12/02/2017 1249   CREATININE 1.2 11/20/2017 0931   CREATININE 1.3 09/21/2017 0921   GLUCOSE 117 (H) 11/12/2018 0245   GLUCOSE 71 11/11/2018 0605   GLUCOSE 99 11/20/2017 0931   GLUCOSE 86 09/21/2017 0921   CALCIUM 7.9 (L) 11/12/2018 0245   CALCIUM 7.7 (L) 11/11/2018 0605   CALCIUM 8.9 11/20/2017 0931   CALCIUM 9.7 09/21/2017 0921   AST 20 11/10/2018 1200   AST 27 11/01/2018 1245   AST 21 10/21/2018 0927   AST 18 07/13/2018 1216   AST 38 (H) 11/20/2017 0931   AST 20 09/21/2017 0921   ALT 16 11/10/2018 1200   ALT 17 11/01/2018 1245   ALT 31 10/21/2018 0927   ALT 11 07/13/2018  1216   ALT 45 11/20/2017 0931   ALT 9 09/21/2017 0921   ALKPHOS 50 11/10/2018 1200   ALKPHOS 45 11/01/2018 1245   ALKPHOS 62 11/20/2017 0931   ALKPHOS 81 09/21/2017 0921   BILITOT 0.8 11/10/2018 1200   BILITOT 0.9 11/01/2018 1245   BILITOT 0.5 10/21/2018 0927   BILITOT 0.6 07/13/2018 1216   BILITOT 0.66 11/20/2017 0931   BILITOT 0.59 09/21/2017 0921   PROT 7.0 11/10/2018 1200   PROT 7.4 11/01/2018 1245   PROT 7.5 11/20/2017 0931   PROT 7.9 09/21/2017 0921   ALBUMIN 3.1 (L) 11/10/2018 1200   ALBUMIN 3.3 (L) 11/01/2018 1245   ALBUMIN 3.1 (L) 11/20/2017 0931   ALBUMIN 3.3 (L) 09/21/2017 0921    Studies/Results: Dg Abd 1 View  Result Date: 11/12/2018 CLINICAL DATA:  Enteric tube placement, small-bowel obstruction EXAM: ABDOMEN - 1 VIEW COMPARISON:  CT abdomen/pelvis and abdominal  radiograph from earlier today FINDINGS: Enteric tube terminates in proximal stomach. Moderately dilated small bowel loops throughout the abdomen, worsened from earlier today. No evidence of pneumatosis or pneumoperitoneum. Hernia repair mesh is noted in the right lower quadrant. No radiopaque nephrolithiasis. Excreted contrast is seen in the bladder. IMPRESSION: Enteric tube terminates in the proximal stomach. Moderately dilated small bowel loops throughout the abdomen, worsened compared to radiograph from earlier today, compatible with distal small bowel obstruction. Electronically Signed   By: Ilona Sorrel M.D.   On: 11/12/2018 11:06   Ct Entero Abd/pelvis W Contast  Result Date: 11/12/2018 CLINICAL DATA:  Exacerbation of Crohn's disease. EXAM: CT ABDOMEN AND PELVIS WITH CONTRAST (ENTEROGRAPHY) TECHNIQUE: Multidetector CT of the abdomen and pelvis during bolus administration of intravenous contrast. Negative oral contrast was given. CONTRAST:  131m OMNIPAQUE IOHEXOL 300 MG/ML  SOLN COMPARISON:  11/10/2018 FINDINGS: Lower chest: Atelectasis in both lung bases. The esophagus is dilated and fluid-filled. This may be due to reflux or dysmotility. No esophageal wall thickening. Hepatobiliary: No masses or other significant abnormality. Pancreas: No mass, inflammatory changes, or other significant abnormality. Mildly dilated pancreatic duct, unchanged since prior study. Spleen: Spleen size is normal. Hyperenhancing focus in the inferior spleen measuring 10 mm diameter. Probable hemangioma. Adrenals/Urinary Tract: No adrenal gland nodules. Kidneys are symmetrical. Nephrograms are homogeneous. No hydronephrosis or hydroureter. Small stones layering in the bladder. No bladder wall thickening. No change. Stomach/Bowel: Stomach and small bowel are distended with fluid. Distal small bowel are decompressed. Some right lower quadrant small bowel loops demonstrate wall thickening and enhancement compatible with history of  Crohn disease. Previous subtotal colectomy with right lower quadrant ileostomy. Left lower quadrant spigelian abdominal wall hernia containing multiple small bowel loops and splenic flexure of colon. This may represent a point of obstruction. Rectosigmoid colon is decompressed. Vascular/Lymphatic: Normal caliber abdominal aorta. Moderate lymphadenopathy demonstrated in the upper abdomen, retroperitoneum, mesentery, and right lower quadrant. Lymph nodes are similar in appearance to previous study and are likely reactive. Reproductive: Asymmetric prostate or right perirectal mass lesion with calcifications, extending to the base of the bladder and the right pelvic sidewall, measuring about 4 x 5.6 cm diameter. Similar appearance to previous study. This may represent a prostate mass. Other: No free air or free fluid in the abdomen. Right lumbar triangle hernia containing fat. Musculoskeletal: Degenerative changes in the spine. No destructive bone lesions. There are well-defined sclerotic lesions in the pelvis, right acetabulum, and right proximal femur possibly representing metastasis such as would be expected with prostate cancer. Correlation with PSA is recommended. IMPRESSION: 1. Esophagus  and stomach are more distended today. Otherwise, no significant change since yesterday's study. 2. Diffuse dilatation of small bowel proximally with distal decompression. Left spigelian hernia containing small bowel and colon. This may form a point of obstruction. Inflammatory mucosal thickening intermittently in the small bowel consistent with history of Crohn disease. Multiple strictured segments are suggested. 3. Abdominal lymphadenopathy likely reactive related to history of Crohn disease. 4. No abscess or fistula demonstrated. 5. Asymmetric right perirectal/prostate mass. 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 6. Right lower quadrant ileostomy. 7. Right lumbar triangle hernia containing fat. Electronically Signed   By:  Lucienne Capers M.D.   On: 11/12/2018 00:37   Dg Abd Portable 1v  Result Date: 11/13/2018 CLINICAL DATA:  Followup small bowel obstruction. EXAM: PORTABLE ABDOMEN - 1 VIEW COMPARISON:  11/12/2018 radiograph, 11/11/2018 CT FINDINGS: NG tube with tip overlying the proximal-mid stomach again noted. Distended small bowel loops have slightly decreased in caliber. No other significant changes identified. IMPRESSION: Decreased distension of small bowel loops without other significant change. Electronically Signed   By: Margarette Canada M.D.   On: 11/13/2018 08:09      Lillan Mccreadie M 11/13/2018  Patient ID: Daniel Howard, male   DOB: 1956/01/18, 62 y.o.   MRN: [id number]

## 2018-11-14 ENCOUNTER — Inpatient Hospital Stay (HOSPITAL_COMMUNITY): Payer: Medicare Other

## 2018-11-14 MED ORDER — MENTHOL 3 MG MT LOZG
1.0000 | LOZENGE | OROMUCOSAL | Status: DC | PRN
  Filled 2018-11-14: qty 9

## 2018-11-14 MED ORDER — MORPHINE SULFATE (PF) 4 MG/ML IV SOLN
4.0000 mg | Freq: Once | INTRAVENOUS | Status: AC
  Administered 2018-11-14: 4 mg via INTRAVENOUS
  Filled 2018-11-14: qty 1

## 2018-11-14 MED ORDER — PHENOL 1.4 % MT LIQD
1.0000 | OROMUCOSAL | Status: DC | PRN
  Administered 2018-11-14: 1 via OROMUCOSAL
  Filled 2018-11-14: qty 177

## 2018-11-14 NOTE — Plan of Care (Signed)
  Problem: Education: Goal: Knowledge of General Education information will improve Description Including pain rating scale, medication(s)/side effects and non-pharmacologic comfort measures Outcome: Progressing   Problem: Health Behavior/Discharge Planning: Goal: Ability to manage health-related needs will improve Outcome: Progressing   Problem: Clinical Measurements: Goal: Will remain free from infection Outcome: Progressing   Problem: Activity: Goal: Risk for activity intolerance will decrease Outcome: Progressing   Problem: Nutrition: Goal: Adequate nutrition will be maintained Outcome: Progressing   Problem: Coping: Goal: Level of anxiety will decrease Outcome: Progressing   Problem: Elimination: Goal: Will not experience complications related to bowel motility Outcome: Progressing Goal: Will not experience complications related to urinary retention Outcome: Progressing   Problem: Pain Managment: Goal: General experience of comfort will improve Outcome: Progressing   Problem: Safety: Goal: Ability to remain free from injury will improve Outcome: Progressing   Problem: Skin Integrity: Goal: Risk for impaired skin integrity will decrease Outcome: Progressing  Forestine Chute, RN 11/14/2018

## 2018-11-14 NOTE — Progress Notes (Signed)
PROGRESS NOTE    Daniel Howard  ZDG:[id number] DOB: Sep 02, 1956 DOA: 11/10/2018 PCP: Daniel Rossetti, MD    Brief Narrative:  Daniel Howard is an 62 y.o. male with medical history significant forprostate cancer,recurrent and castration-resistant now, Crohn's diseasestatus post colostomy,recurrentsmall bowel obstructionsstatus post multiple abdominal surgeries and recentadmission forpartial small bowel obstruction last week who presents with worseningabdominal pain and decreased flatus.At his presentation to the hospital last week he had complained of abdominal pain, diarrhea and increased ostomy output. CT at that time showed partial small bowel obstruction with possible active Crohn's disease. He was evaluated by general surgery who felt that he did not have a surgical abdomen and thought he had a possible Crohn's flare. They also questionedadhesions versus chronic stricture. Gastroenterology was consulted who felt the patient could follow-up as an outpatient with Dr. Gala Howard in [place]. After bowel rest in the hospital symptoms resolved and he was tolerating a full soft diet discharge with no abdominal pain. He had a follow-up appointment withDr. Gala Howard yesterday and the plan was to get CTE to evaluate possiblebowel strictureseen on recent CT. however, since his appointment, his abdominal pain has returned which he reports is a 10 out of 10 at its worst.   Patient was readmitted for partial SBO and an NG tube placed and surgery consulted.   Assessment & Plan:   Principal Problem:   Partial small bowel obstruction (HCC) Active Problems:   Crohn's disease of both small and large intestine with complication (HCC)   Prostate cancer (Daniel Howard)   Colostomy status (Daniel Howard)   SBO (small bowel obstruction) (HCC)  Recurrent partial SBO: Possibly from adhesions versus chronic stricture versus left spigelian hernia.  Surgery consulted and NG tube placed.  Pain control, IV anti  emetics. IV hydration.  On exam today patient's abdominal distention , nausea, and abd pain has improved. Surgery recommended clamping the NG tube and starting on clears. He is able to tolerate the clears so far. abd film is unchanged.   Crohn's disease: Follow up with GI as outpatient.    Prostate cancer: Resume anti androgen therapy when able to take orally.    Anemia of chronic disease  hemoglobin stable around 11    DVT prophylaxis: lovenox.  Code Status:  Full code Family Communication: family at bedside.  Disposition Plan: Ending clinical improvement and surgery recommendations   Consultants:   Surgery.    Procedures: NG tube placement.    Antimicrobials: none    Subjective: Abdominal distention and nausea has improved.  Is any vomiting.  Objective: Vitals:   11/12/18 2202 11/13/18 0417 11/13/18 1400 11/14/18 0441  BP: 135/75 (!) 152/86 (!) 143/99 133/78  Pulse: 88 95 (!) 101 98  Resp: 16 14 18 18   Temp: 99 F (37.2 C) 99.6 F (37.6 C) 99.1 F (37.3 C) 99 F (37.2 C)  TempSrc: Oral Oral Oral Oral  SpO2: 98% 97% 100% 100%  Weight:      Height:        Intake/Output Summary (Last 24 hours) at 11/14/2018 0920 Last data filed at 11/14/2018 0448 Gross per 24 hour  Intake 2295.69 ml  Output 2475 ml  Net -179.31 ml   Filed Weights   11/10/18 1120  Weight: 64.9 kg    Examination:  General exam: He appears comfortable this morning, NG tube in place  And clamped Respiratory system: clear to auscultations, no wheezing or rhonchi.  Cardiovascular system: S1 & S2 heard, RRR. No JVD,  . No pedal edema.  Gastrointestinal system: Abdomen is mildly distended, soft, generalized tenderness present,bowel sounds hypoactive.  Central nervous system: Alert and oriented able to move all extremities.  Extremities: Symmetric 5 x 5 power. Skin: No rashes, lesions or ulcers      Data Reviewed: I have personally reviewed following labs and imaging  studies  CBC: Recent Labs  Lab 11/10/18 1200 11/11/18 0605 11/12/18 0245  WBC 7.2 8.2 6.1  NEUTROABS 3.5  --   --   HGB 11.5* 11.4* 11.1*  HCT 37.1* 35.8* 34.0*  MCV 98.1 97.8 95.5  PLT 391 333 383   Basic Metabolic Panel: Recent Labs  Lab 11/10/18 1200 11/11/18 0605 11/12/18 0245  NA 136 135 137  K 3.7 3.9 3.8  CL 104 106 106  CO2 25 21* 23  GLUCOSE 95 71 117*  BUN 9 9 6   CREATININE 1.14 1.16 1.01  CALCIUM 8.5* 7.7* 7.9*   GFR: Estimated Creatinine Clearance: 66.4 mL/min (by C-G formula based on SCr of 1.01 mg/dL). Liver Function Tests: Recent Labs  Lab 11/10/18 1200  AST 20  ALT 16  ALKPHOS 50  BILITOT 0.8  PROT 7.0  ALBUMIN 3.1*   Recent Labs  Lab 11/10/18 1200  LIPASE 25   No results for input(s): AMMONIA in the last 168 hours. Coagulation Profile: No results for input(s): INR, PROTIME in the last 168 hours. Cardiac Enzymes: No results for input(s): CKTOTAL, CKMB, CKMBINDEX, TROPONINI in the last 168 hours. BNP (last 3 results) No results for input(s): PROBNP in the last 8760 hours. HbA1C: No results for input(s): HGBA1C in the last 72 hours. CBG: No results for input(s): GLUCAP in the last 168 hours. Lipid Profile: No results for input(s): CHOL, HDL, LDLCALC, TRIG, CHOLHDL, LDLDIRECT in the last 72 hours. Thyroid Function Tests: No results for input(s): TSH, T4TOTAL, FREET4, T3FREE, THYROIDAB in the last 72 hours. Anemia Panel: No results for input(s): VITAMINB12, FOLATE, FERRITIN, TIBC, IRON, RETICCTPCT in the last 72 hours. Sepsis Labs: Recent Labs  Lab 11/10/18 1212  LATICACIDVEN 1.00    No results found for this or any previous visit (from the past 240 hour(s)).       Radiology Studies: Dg Abd 1 View  Result Date: 11/12/2018 CLINICAL DATA:  Enteric tube placement, small-bowel obstruction EXAM: ABDOMEN - 1 VIEW COMPARISON:  CT abdomen/pelvis and abdominal radiograph from earlier today FINDINGS: Enteric tube terminates in  proximal stomach. Moderately dilated small bowel loops throughout the abdomen, worsened from earlier today. No evidence of pneumatosis or pneumoperitoneum. Hernia repair mesh is noted in the right lower quadrant. No radiopaque nephrolithiasis. Excreted contrast is seen in the bladder. IMPRESSION: Enteric tube terminates in the proximal stomach. Moderately dilated small bowel loops throughout the abdomen, worsened compared to radiograph from earlier today, compatible with distal small bowel obstruction. Electronically Signed   By: Ilona Sorrel M.D.   On: 11/12/2018 11:06   Dg Abd 2 Views  Result Date: 11/14/2018 CLINICAL DATA:  Follow-up small bowel obstruction. EXAM: ABDOMEN - 2 VIEW COMPARISON:  11/13/2018 and prior exams FINDINGS: An NG tube with tip overlying the mid stomach is again noted. Dilated fluid and gas-filled loops of small bowel are again noted and not significantly changed. There is no evidence of pneumoperitoneum. IMPRESSION: No significant change in distended small bowel loops from 11/13/2018. Electronically Signed   By: Margarette Canada M.D.   On: 11/14/2018 09:17   Dg Abd Portable 1v  Result Date: 11/13/2018 CLINICAL DATA:  Followup small bowel obstruction. EXAM: PORTABLE ABDOMEN -  1 VIEW COMPARISON:  11/12/2018 radiograph, 11/11/2018 CT FINDINGS: NG tube with tip overlying the proximal-mid stomach again noted. Distended small bowel loops have slightly decreased in caliber. No other significant changes identified. IMPRESSION: Decreased distension of small bowel loops without other significant change. Electronically Signed   By: Margarette Canada M.D.   On: 11/13/2018 08:09        Scheduled Meds: . Adalimumab  1 Syringe Subcutaneous Q Wed  . budesonide  6 mg Oral Daily  . enoxaparin (LOVENOX) injection  40 mg Subcutaneous Q24H  . enzalutamide  160 mg Oral Daily  . mirtazapine  15 mg Oral QHS   Continuous Infusions: . sodium chloride 100 mL/hr at 11/14/18 0448     LOS: 3 days     Time spent: 28 minutes    Hosie Poisson, MD [facility] Pager [phone]  If 7PM-7AM, please contact night-coverage [email or web address] Password [email or web address] 11/14/2018, 9:20 AM

## 2018-11-14 NOTE — Progress Notes (Signed)
Assessment & Plan: HD#5 - small bowel obstruction, recurrent             Adhesions vs chronic stricturevs left spigelian hernia             Crohn's disease on biologic therapy             Ileostomy function much improved - clamp NG tube, trial clear liquid diet  Encouraged OOB, ambulation  Met prostate cancer on chemo Multiple prior admissions for SBO Chronic incarcerated Incisional hernia        Daniel Gemma, MD       [facility]       Office: [phone]   Chief Complaint: SBO, Crohn's  Subjective: Patient in bed, irritated by NG tube.  Ileostomy with large volume output overnight.  Objective: Vital signs in last 24 hours: Temp:  [99 F (37.2 C)-99.1 F (37.3 C)] 99 F (37.2 C) (11/24 0441) Pulse Rate:  [98-101] 98 (11/24 0441) Resp:  [18] 18 (11/24 0441) BP: (133-143)/(78-99) 133/78 (11/24 0441) SpO2:  [100 %] 100 % (11/24 0441) Last BM Date: 11/11/18  Intake/Output from previous day: 11/23 0701 - 11/24 0700 In: 2295.7 [I.V.:2275.7; NG/GT:20] Out: 9983 [Urine:375; Emesis/NG output:1050; Stool:1050] Intake/Output this shift: No intake/output data recorded.  Physical Exam: HEENT - sclerae clear, mucous membranes moist Neck - soft Chest - clear bilaterally Cor - RRR Abdomen - softer, minimally tender; ileostomy with large liquid succus in bag Ext - no edema, non-tender Neuro - alert & oriented, no focal deficits  Lab Results:  Recent Labs    11/12/18 0245  WBC 6.1  HGB 11.1*  HCT 34.0*  PLT 378   BMET Recent Labs    11/12/18 0245  NA 137  K 3.8  CL 106  CO2 23  GLUCOSE 117*  BUN 6  CREATININE 1.01  CALCIUM 7.9*   PT/INR No results for input(s): LABPROT, INR in the last 72 hours. Comprehensive Metabolic Panel:    Component Value Date/Time   NA 137 11/12/2018 0245   NA 135 11/11/2018 0605   NA 137 11/20/2017 0931   NA 136 09/21/2017 0921   K 3.8 11/12/2018 0245   K 3.9 11/11/2018 0605   K 3.3 (L) 11/20/2017  0931   K 4.1 09/21/2017 0921   CL 106 11/12/2018 0245   CL 106 11/11/2018 0605   CO2 23 11/12/2018 0245   CO2 21 (L) 11/11/2018 0605   CO2 27 11/20/2017 0931   CO2 26 09/21/2017 0921   BUN 6 11/12/2018 0245   BUN 9 11/11/2018 0605   BUN 16.6 11/20/2017 0931   BUN 15.1 09/21/2017 0921   CREATININE 1.01 11/12/2018 0245   CREATININE 1.16 11/11/2018 0605   CREATININE 1.02 10/21/2018 0927   CREATININE 1.01 07/13/2018 1216   CREATININE 0.99 12/02/2017 1249   CREATININE 1.2 11/20/2017 0931   CREATININE 1.3 09/21/2017 0921   GLUCOSE 117 (H) 11/12/2018 0245   GLUCOSE 71 11/11/2018 0605   GLUCOSE 99 11/20/2017 0931   GLUCOSE 86 09/21/2017 0921   CALCIUM 7.9 (L) 11/12/2018 0245   CALCIUM 7.7 (L) 11/11/2018 0605   CALCIUM 8.9 11/20/2017 0931   CALCIUM 9.7 09/21/2017 0921   AST 20 11/10/2018 1200   AST 27 11/01/2018 1245   AST 21 10/21/2018 0927   AST 18 07/13/2018 1216   AST 38 (H) 11/20/2017 0931   AST 20 09/21/2017 0921   ALT 16 11/10/2018 1200   ALT 17 11/01/2018 1245  ALT 31 10/21/2018 0927   ALT 11 07/13/2018 1216   ALT 45 11/20/2017 0931   ALT 9 09/21/2017 0921   ALKPHOS 50 11/10/2018 1200   ALKPHOS 45 11/01/2018 1245   ALKPHOS 62 11/20/2017 0931   ALKPHOS 81 09/21/2017 0921   BILITOT 0.8 11/10/2018 1200   BILITOT 0.9 11/01/2018 1245   BILITOT 0.5 10/21/2018 0927   BILITOT 0.6 07/13/2018 1216   BILITOT 0.66 11/20/2017 0931   BILITOT 0.59 09/21/2017 0921   PROT 7.0 11/10/2018 1200   PROT 7.4 11/01/2018 1245   PROT 7.5 11/20/2017 0931   PROT 7.9 09/21/2017 0921   ALBUMIN 3.1 (L) 11/10/2018 1200   ALBUMIN 3.3 (L) 11/01/2018 1245   ALBUMIN 3.1 (L) 11/20/2017 0931   ALBUMIN 3.3 (L) 09/21/2017 0921    Studies/Results: Dg Abd 1 View  Result Date: 11/12/2018 CLINICAL DATA:  Enteric tube placement, small-bowel obstruction EXAM: ABDOMEN - 1 VIEW COMPARISON:  CT abdomen/pelvis and abdominal radiograph from earlier today FINDINGS: Enteric tube terminates in proximal  stomach. Moderately dilated small bowel loops throughout the abdomen, worsened from earlier today. No evidence of pneumatosis or pneumoperitoneum. Hernia repair mesh is noted in the right lower quadrant. No radiopaque nephrolithiasis. Excreted contrast is seen in the bladder. IMPRESSION: Enteric tube terminates in the proximal stomach. Moderately dilated small bowel loops throughout the abdomen, worsened compared to radiograph from earlier today, compatible with distal small bowel obstruction. Electronically Signed   By: Ilona Sorrel M.D.   On: 11/12/2018 11:06   Dg Abd Portable 1v  Result Date: 11/13/2018 CLINICAL DATA:  Followup small bowel obstruction. EXAM: PORTABLE ABDOMEN - 1 VIEW COMPARISON:  11/12/2018 radiograph, 11/11/2018 CT FINDINGS: NG tube with tip overlying the proximal-mid stomach again noted. Distended small bowel loops have slightly decreased in caliber. No other significant changes identified. IMPRESSION: Decreased distension of small bowel loops without other significant change. Electronically Signed   By: Margarette Canada M.D.   On: 11/13/2018 08:09      [name] 11/14/2018  Patient ID: Daniel Howard, male   DOB: 07-Jul-1956, 62 y.o.   MRN: [id number]

## 2018-11-15 LAB — COMPREHENSIVE METABOLIC PANEL
ALBUMIN: 2.7 g/dL — AB (ref 3.5–5.0)
ALK PHOS: 58 U/L (ref 38–126)
ALT: 16 U/L (ref 0–44)
ANION GAP: 9 (ref 5–15)
AST: 21 U/L (ref 15–41)
BILIRUBIN TOTAL: 1.3 mg/dL — AB (ref 0.3–1.2)
BUN: 5 mg/dL — ABNORMAL LOW (ref 6–20)
CALCIUM: 7.4 mg/dL — AB (ref 8.9–10.3)
CO2: 23 mmol/L (ref 22–32)
Chloride: 106 mmol/L (ref 98–111)
Creatinine, Ser: 0.99 mg/dL (ref 0.61–1.24)
GFR calc Af Amer: >60 mL/min (ref >60)
GFR calc non Af Amer: >60 mL/min (ref >60)
GLUCOSE: 85 mg/dL (ref 70–99)
POTASSIUM: 3.7 mmol/L (ref 3.5–5.1)
Sodium: 138 mmol/L (ref 135–145)
TOTAL PROTEIN: 6.7 g/dL (ref 6.5–8.1)

## 2018-11-15 LAB — MAGNESIUM: MAGNESIUM: 2.1 mg/dL (ref 1.7–2.4)

## 2018-11-15 NOTE — Progress Notes (Signed)
PROGRESS NOTE    Daniel Howard  XBJ:[id number] DOB: [birth year] DOA: 11/10/2018 PCP: Alycia Rossetti, MD    Brief Narrative:  Daniel Howard is an 62 y.o. male with medical history significant forprostate cancer,recurrent and castration-resistant now, Crohn's diseasestatus post colostomy,recurrentsmall bowel obstructionsstatus post multiple abdominal surgeries and recentadmission forpartial small bowel obstruction last week who presents with worseningabdominal pain and decreased flatus.At his presentation to the hospital last week he had complained of abdominal pain, diarrhea and increased ostomy output. CT at that time showed partial small bowel obstruction with possible active Crohn's disease. He was evaluated by general surgery who felt that he did not have a surgical abdomen and thought he had a possible Crohn's flare. They also questionedadhesions versus chronic stricture. Gastroenterology was consulted who felt the patient could follow-up as an outpatient with Dr. Gala Romney in [place]. After bowel rest in the hospital symptoms resolved and he was tolerating a full soft diet discharge with no abdominal pain. He had a follow-up appointment withDr. Gala Romney yesterday and the plan was to get CTE to evaluate possiblebowel strictureseen on recent CT. however, since his appointment, his abdominal pain has returned which he reports is a 10 out of 10 at its worst.   Patient was readmitted for partial SBO and an NG tube placed and surgery consulted.   Assessment & Plan:   Principal Problem:   Partial small bowel obstruction (HCC) Active Problems:   Crohn's disease of both small and large intestine with complication (HCC)   Prostate cancer ([place])   Colostomy status ([place])   SBO (small bowel obstruction) (HCC)  Recurrent partial SBO:  adhesions versus chronic stricture versus left spigelian hernia.  Patient with prior multiple surgeries.  Ostomy status.  NG tube in place.   Denies nausea, vomiting or abdominal pain this morning.  No tenderness to palpation on exam.  Abundant grey liquid stool in ostomy bag without blood. -Surgery following-recommended discontinuing NG tube and advancing diet.  Crohn's disease: Followed by GI. -Continue home budesonide and immunomodulators.  Prostate cancer: Stable -resume anti androgen therapy when able to take orally.   Anemia of chronic disease  hemoglobin stable around 11  DVT prophylaxis: lovenox.  Code Status:  Full code Family Communication: family at bedside.  Disposition Plan: Ending clinical improvement and surgery recommendations.  Anticipate discharge in the next 24 to 48 hours.  Consultants:   Surgery.    Antimicrobials: none    Subjective: No major events overnight.  No complaints this morning.  Asking for when he can eat.  Denies nausea or abdominal pain.  Objective: Vitals:   11/13/18 1400 11/14/18 0441 11/14/18 1340 11/15/18 0538  BP: (!) 143/99 133/78 (!) 143/90 130/73  Pulse: (!) 101 98 (!) 102 87  Resp: 18 18 15 16   Temp: 99.1 F (37.3 C) 99 F (37.2 C) 98.2 F (36.8 C) 98.7 F (37.1 C)  TempSrc: Oral Oral Oral Oral  SpO2: 100% 100% 97% 98%  Weight:      Height:        Intake/Output Summary (Last 24 hours) at 11/15/2018 1259 Last data filed at 11/15/2018 0951 Gross per 24 hour  Intake 120 ml  Output 750 ml  Net -630 ml   Filed Weights   11/10/18 1120  Weight: 64.9 kg    Examination: GENERAL: appears well, no ditress HEENT: PERRLA. NG tube in place.  Hearing grossly intact. NECK: Supple.  No JVD LUNGS:  No IWOB, good air movement, CTAB HEART:  RRR.  Normal rate.  Heart sounds normal. ABD:  soft, NT with hyperactive bowel sounds.  Multiple abdominal scar from prior surgery.  Ostomy bag with abundant grayish stool.  No tenderness to palpation. NEURO: Awake, alert and oriented appropriately.  No gross deficit. PSYCH: normal affect   Data Reviewed: I have personally  reviewed following labs and imaging studies  CBC: Recent Labs  Lab 11/10/18 1200 11/11/18 0605 11/12/18 0245  WBC 7.2 8.2 6.1  NEUTROABS 3.5  --   --   HGB 11.5* 11.4* 11.1*  HCT 37.1* 35.8* 34.0*  MCV 98.1 97.8 95.5  PLT 391 333 128   Basic Metabolic Panel: Recent Labs  Lab 11/10/18 1200 11/11/18 0605 11/12/18 0245 11/15/18 1142  NA 136 135 137 138  K 3.7 3.9 3.8 3.7  CL 104 106 106 106  CO2 25 21* 23 23  GLUCOSE 95 71 117* 85  BUN 9 9 6  5*  CREATININE 1.14 1.16 1.01 0.99  CALCIUM 8.5* 7.7* 7.9* 7.4*  MG  --   --   --  2.1   GFR: Estimated Creatinine Clearance: 67.8 mL/min (by C-G formula based on SCr of 0.99 mg/dL). Liver Function Tests: Recent Labs  Lab 11/10/18 1200 11/15/18 1142  AST 20 21  ALT 16 16  ALKPHOS 50 58  BILITOT 0.8 1.3*  PROT 7.0 6.7  ALBUMIN 3.1* 2.7*   Recent Labs  Lab 11/10/18 1200  LIPASE 25   No results for input(s): AMMONIA in the last 168 hours. Coagulation Profile: No results for input(s): INR, PROTIME in the last 168 hours. Cardiac Enzymes: No results for input(s): CKTOTAL, CKMB, CKMBINDEX, TROPONINI in the last 168 hours. BNP (last 3 results) No results for input(s): PROBNP in the last 8760 hours. HbA1C: No results for input(s): HGBA1C in the last 72 hours. CBG: No results for input(s): GLUCAP in the last 168 hours. Lipid Profile: No results for input(s): CHOL, HDL, LDLCALC, TRIG, CHOLHDL, LDLDIRECT in the last 72 hours. Thyroid Function Tests: No results for input(s): TSH, T4TOTAL, FREET4, T3FREE, THYROIDAB in the last 72 hours. Anemia Panel: No results for input(s): VITAMINB12, FOLATE, FERRITIN, TIBC, IRON, RETICCTPCT in the last 72 hours. Sepsis Labs: Recent Labs  Lab 11/10/18 1212  LATICACIDVEN 1.00    No results found for this or any previous visit (from the past 240 hour(s)).       Radiology Studies: Dg Abd 2 Views  Result Date: 11/14/2018 CLINICAL DATA:  Follow-up small bowel obstruction. EXAM:  ABDOMEN - 2 VIEW COMPARISON:  11/13/2018 and prior exams FINDINGS: An NG tube with tip overlying the mid stomach is again noted. Dilated fluid and gas-filled loops of small bowel are again noted and not significantly changed. There is no evidence of pneumoperitoneum. IMPRESSION: No significant change in distended small bowel loops from 11/13/2018. Electronically Signed   By: Margarette Canada M.D.   On: 11/14/2018 09:17    Scheduled Meds: . Adalimumab  1 Syringe Subcutaneous Q Wed  . budesonide  6 mg Oral Daily  . enoxaparin (LOVENOX) injection  40 mg Subcutaneous Q24H  . enzalutamide  160 mg Oral Daily  . mirtazapine  15 mg Oral QHS   Continuous Infusions: . sodium chloride 100 mL/hr at 11/15/18 1147     LOS: 4 days    [place] [facility] Pager [phone]  If 7PM-7AM, please contact night-coverage [email or web address] Password [email or web address] 11/15/2018, 1:07 PM

## 2018-11-15 NOTE — Progress Notes (Signed)
Pt. complained of severe pain and could not wait to get next dose of pain medication. Paged MD and received an order for a one time dose of 41m of morphine.

## 2018-11-15 NOTE — Progress Notes (Signed)
[facility] Progress Note      Assessment/Plan Principal Problem:   Partial small bowel obstruction (HCC) Active Problems:   Crohn's disease of both small and large intestine with complication (HCC)   Prostate cancer ([place])   Colostomy status ([place])   SBO (small bowel obstruction) (HCC)  Recurrent SBO - ileostomy working and pt had copious liquid stool and gas in bag this am - DC NGT and advance to FLD  FEN: FLD VTE: SCD's, lovenox ID: none Follow up:  TBD    LOS: 4 days    Subjective: CC: SBO  NGT is irritating. No issues overnight.   Objective: Vital signs in last 24 hours: Temp:  [98.2 F (36.8 C)-98.7 F (37.1 C)] 98.7 F (37.1 C) (11/25 0538) Pulse Rate:  [87-102] 87 (11/25 0538) Resp:  [15-16] 16 (11/25 0538) BP: (130-143)/(73-90) 130/73 (11/25 0538) SpO2:  [97 %-98 %] 98 % (11/25 0538) Last BM Date: 11/14/18  Intake/Output from previous day: 11/24 0701 - 11/25 0700 In: 120 [P.O.:120] Out: 500 [Urine:500] Intake/Output this shift: Total I/O In: -  Out: 250 [Urine:250]  PE: Gen:  Alert, NAD, pleasant, cooperative Pulm:  Rate and effort normal Abd: Soft, ND, +BS, ileostomy with large amt of liquid stool and gas in bag Skin: no rashes noted, warm and dry   Anti-infectives: Anti-infectives (From admission, onward)   None      Lab Results:  No results for input(s): WBC, HGB, HCT, PLT in the last 72 hours. BMET No results for input(s): NA, K, CL, CO2, GLUCOSE, BUN, CREATININE, CALCIUM in the last 72 hours. PT/INR No results for input(s): LABPROT, INR in the last 72 hours. CMP     Component Value Date/Time   NA 137 11/12/2018 0245   NA 137 11/20/2017 0931   K 3.8 11/12/2018 0245   K 3.3 (L) 11/20/2017 0931   CL 106 11/12/2018 0245   CO2 23 11/12/2018 0245   CO2 27 11/20/2017 0931   GLUCOSE 117 (H) 11/12/2018 0245   GLUCOSE 99 11/20/2017 0931   BUN 6 11/12/2018 0245   BUN 16.6 11/20/2017 0931   CREATININE 1.01  11/12/2018 0245   CREATININE 1.02 10/21/2018 0927   CREATININE 0.99 12/02/2017 1249   CREATININE 1.2 11/20/2017 0931   CALCIUM 7.9 (L) 11/12/2018 0245   CALCIUM 8.9 11/20/2017 0931   PROT 7.0 11/10/2018 1200   PROT 7.5 11/20/2017 0931   ALBUMIN 3.1 (L) 11/10/2018 1200   ALBUMIN 3.1 (L) 11/20/2017 0931   AST 20 11/10/2018 1200   AST 21 10/21/2018 0927   AST 38 (H) 11/20/2017 0931   ALT 16 11/10/2018 1200   ALT 31 10/21/2018 0927   ALT 45 11/20/2017 0931   ALKPHOS 50 11/10/2018 1200   ALKPHOS 62 11/20/2017 0931   BILITOT 0.8 11/10/2018 1200   BILITOT 0.5 10/21/2018 0927   BILITOT 0.66 11/20/2017 0931   GFRNONAA >60 11/12/2018 0245   GFRNONAA >60 10/21/2018 0927   GFRNONAA 52 (L) 02/11/2017 1640   GFRAA >60 11/12/2018 0245   GFRAA >60 10/21/2018 0927   GFRAA 60 02/11/2017 1640   Lipase     Component Value Date/Time   LIPASE 25 11/10/2018 1200    Studies/Results: Dg Abd 2 Views  Result Date: 11/14/2018 CLINICAL DATA:  Follow-up small bowel obstruction. EXAM: ABDOMEN - 2 VIEW COMPARISON:  11/13/2018 and prior exams FINDINGS: An NG tube with tip overlying the mid stomach is again noted. Dilated fluid and gas-filled loops of small bowel are again  noted and not significantly changed. There is no evidence of pneumoperitoneum. IMPRESSION: No significant change in distended small bowel loops from 11/13/2018. Electronically Signed   By: Margarette Canada M.D.   On: 11/14/2018 09:17      Kalman Drape , [facility] 11/15/2018, 10:54 AM  Pager: [phone] Mon-Wed, Friday 7:00am-4:30pm Thurs 7am-11:30am  Consults: [phone]

## 2018-11-16 NOTE — Progress Notes (Addendum)
[facility]/Trauma Progress Note      Assessment/Plan Principal Problem:   Partial small bowel obstruction (HCC) Active Problems:   Crohn's disease of both small and large intestine with complication (HCC)   Prostate cancer ([place])   Colostomy status (HCC)   SBO (small bowel obstruction) (HCC)  Recurrent SBO - ileostomy working   FEN: advance to soft diet VTE: SCD's, lovenox ID: none Follow up: none needed  Dispo: pt okay for discharge from a surgical standpoint. We will sign off. Please page [place] with any further needs.    LOS: 5 days    Subjective: CC: SBO  No issues overnight. Pt is tolerating diet and having ostomy output. He is emptying his own bag. No cc's recorded. Instructed nurse to make sure we are measuring his output. Pt denies nausea or vomiting.  Objective: Vital signs in last 24 hours: Temp:  [98.4 F (36.9 C)-98.9 F (37.2 C)] 98.4 F (36.9 C) (11/26 0656) Pulse Rate:  [80-86] 80 (11/26 0656) Resp:  [16-18] 16 (11/26 0656) BP: (120-139)/(70-86) 120/70 (11/26 0656) SpO2:  [99 %] 99 % (11/26 0656) Last BM Date: 11/15/18  Intake/Output from previous day: 11/25 0701 - 11/26 0700 In: 1823.3 [P.O.:960; I.V.:863.3] Out: 1250 [Urine:1250] Intake/Output this shift: Total I/O In: -  Out: 100 [Stool:100]  PE: Gen:  Alert, NAD, pleasant, cooperative Pulm:  Rate and effort normal Abd: Soft, ND, +BS, ileostomy with small amount of liquid stool and gas in bag Skin: no rashes noted, warm and dry  Anti-infectives: Anti-infectives (From admission, onward)   None      Lab Results:  No results for input(s): WBC, HGB, HCT, PLT in the last 72 hours. BMET Recent Labs    11/15/18 1142  NA 138  K 3.7  CL 106  CO2 23  GLUCOSE 85  BUN 5*  CREATININE 0.99  CALCIUM 7.4*   PT/INR No results for input(s): LABPROT, INR in the last 72 hours. CMP     Component Value Date/Time   NA 138 11/15/2018 1142   NA 137 11/20/2017 0931   K 3.7  11/15/2018 1142   K 3.3 (L) 11/20/2017 0931   CL 106 11/15/2018 1142   CO2 23 11/15/2018 1142   CO2 27 11/20/2017 0931   GLUCOSE 85 11/15/2018 1142   GLUCOSE 99 11/20/2017 0931   BUN 5 (L) 11/15/2018 1142   BUN 16.6 11/20/2017 0931   CREATININE 0.99 11/15/2018 1142   CREATININE 1.02 10/21/2018 0927   CREATININE 0.99 12/02/2017 1249   CREATININE 1.2 11/20/2017 0931   CALCIUM 7.4 (L) 11/15/2018 1142   CALCIUM 8.9 11/20/2017 0931   PROT 6.7 11/15/2018 1142   PROT 7.5 11/20/2017 0931   ALBUMIN 2.7 (L) 11/15/2018 1142   ALBUMIN 3.1 (L) 11/20/2017 0931   AST 21 11/15/2018 1142   AST 21 10/21/2018 0927   AST 38 (H) 11/20/2017 0931   ALT 16 11/15/2018 1142   ALT 31 10/21/2018 0927   ALT 45 11/20/2017 0931   ALKPHOS 58 11/15/2018 1142   ALKPHOS 62 11/20/2017 0931   BILITOT 1.3 (H) 11/15/2018 1142   BILITOT 0.5 10/21/2018 0927   BILITOT 0.66 11/20/2017 0931   GFRNONAA >60 11/15/2018 1142   GFRNONAA >60 10/21/2018 0927   GFRNONAA 52 (L) 02/11/2017 1640   GFRAA >60 11/15/2018 1142   GFRAA >60 10/21/2018 0927   GFRAA 60 02/11/2017 1640   Lipase     Component Value Date/Time   LIPASE 25 11/10/2018 1200  Studies/Results: No results found.    Kalman Drape , [facility] 11/16/2018, 9:29 AM  Pager: [phone] Mon-Wed, Friday 7:00am-4:30pm Thurs 7am-11:30am  Consults: [phone]

## 2018-11-16 NOTE — Progress Notes (Signed)
PROGRESS NOTE    Daniel Howard  CBJ:[id number] DOB: 1956-02-14 DOA: 11/10/2018 PCP: Alycia Rossetti, MD    Brief Narrative:  Daniel Howard is an 62 y.o. male with medical history significant forprostate cancer,recurrent and castration-resistant now, Crohn's diseasestatus post colostomy,recurrentsmall bowel obstructionsstatus post multiple abdominal surgeries and recentadmission forpartial small bowel obstruction last week who presents with worseningabdominal pain and decreased flatus.At his presentation to the hospital last week he had complained of abdominal pain, diarrhea and increased ostomy output. CT at that time showed partial small bowel obstruction with possible active Crohn's disease. He was evaluated by general surgery who felt that he did not have a surgical abdomen and thought he had a possible Crohn's flare. They also questionedadhesions versus chronic stricture. Gastroenterology was consulted who felt the patient could follow-up as an outpatient with Dr. Gala Romney in [place]. After bowel rest in the hospital symptoms resolved and he was tolerating a full soft diet discharge with no abdominal pain. He had a follow-up appointment withDr. Gala Romney yesterday and the plan was to get CTE to evaluate possiblebowel strictureseen on recent CT. however, since his appointment, his abdominal pain has returned which he reports is a 10 out of 10 at its worst.   Patient was readmitted for partial SBO and an NG tube placed and surgery consulted.   Assessment & Plan:   Recurrent partial SBO:  adhesions versus chronic stricture -H/o prior multiple surgeries and Crohns dz with Ostomy status -Treated with bowel rest, NG decompression -Followed by general surgery -Also sees GI Dr. Gala Romney for Crohn's disease, on treatment with budesonide and Remicade, CT enterography planned in a few weeks as outpatient -Ambulate, advanced to soft diet today -DC home later today if tolerates  diet  Crohn's disease: Followed by GI. -Continue home budesonide and immunomodulators.  Prostate cancer: Stable -resume anti androgen therapy   Anemia of chronic disease  hemoglobin stable   DVT prophylaxis: lovenox.  Code Status:  Full code Family Communication: family at bedside.  Disposition Plan: Home later today or in a.m.  Consultants:   Surgery.    Antimicrobials: none    Subjective: -No events overnight, does not feel ready to go home yet, wants to see if he can tolerate soft diet Objective: Vitals:   11/15/18 0538 11/15/18 1353 11/15/18 2156 11/16/18 0656  BP: 130/73 135/86 139/85 120/70  Pulse: 87 86 81 80  Resp: 16 18 16 16   Temp: 98.7 F (37.1 C) 98.9 F (37.2 C) 98.6 F (37 C) 98.4 F (36.9 C)  TempSrc: Oral Oral Oral   SpO2: 98% 99% 99% 99%  Weight:      Height:        Intake/Output Summary (Last 24 hours) at 11/16/2018 1433 Last data filed at 11/16/2018 0910 Gross per 24 hour  Intake 983.33 ml  Output 1275 ml  Net -291.67 ml   Filed Weights   11/10/18 1120  Weight: 64.9 kg    Examination: Gen: Awake, Alert, Oriented X 3, no distress HEENT: PERRLA, Neck supple, no JVD Lungs: Good air movement bilaterally, CTAB CVS: RRR,No Gallops,Rubs or new Murmurs Abd: soft, NT with + bowel sounds.  Multiple abdominal scars from prior surgery.  Ostomy bag with brown/green stool Extremities: No edema   Data Reviewed: I have personally reviewed following labs and imaging studies  CBC: Recent Labs  Lab 11/10/18 1200 11/11/18 0605 11/12/18 0245  WBC 7.2 8.2 6.1  NEUTROABS 3.5  --   --   HGB 11.5* 11.4* 11.1*  HCT 37.1* 35.8* 34.0*  MCV 98.1 97.8 95.5  PLT 391 333 614   Basic Metabolic Panel: Recent Labs  Lab 11/10/18 1200 11/11/18 0605 11/12/18 0245 11/15/18 1142  NA 136 135 137 138  K 3.7 3.9 3.8 3.7  CL 104 106 106 106  CO2 25 21* 23 23  GLUCOSE 95 71 117* 85  BUN 9 9 6  5*  CREATININE 1.14 1.16 1.01 0.99  CALCIUM 8.5* 7.7* 7.9*  7.4*  MG  --   --   --  2.1   GFR: Estimated Creatinine Clearance: 67.8 mL/min (by C-G formula based on SCr of 0.99 mg/dL). Liver Function Tests: Recent Labs  Lab 11/10/18 1200 11/15/18 1142  AST 20 21  ALT 16 16  ALKPHOS 50 58  BILITOT 0.8 1.3*  PROT 7.0 6.7  ALBUMIN 3.1* 2.7*   Recent Labs  Lab 11/10/18 1200  LIPASE 25   No results for input(s): AMMONIA in the last 168 hours. Coagulation Profile: No results for input(s): INR, PROTIME in the last 168 hours. Cardiac Enzymes: No results for input(s): CKTOTAL, CKMB, CKMBINDEX, TROPONINI in the last 168 hours. BNP (last 3 results) No results for input(s): PROBNP in the last 8760 hours. HbA1C: No results for input(s): HGBA1C in the last 72 hours. CBG: No results for input(s): GLUCAP in the last 168 hours. Lipid Profile: No results for input(s): CHOL, HDL, LDLCALC, TRIG, CHOLHDL, LDLDIRECT in the last 72 hours. Thyroid Function Tests: No results for input(s): TSH, T4TOTAL, FREET4, T3FREE, THYROIDAB in the last 72 hours. Anemia Panel: No results for input(s): VITAMINB12, FOLATE, FERRITIN, TIBC, IRON, RETICCTPCT in the last 72 hours. Sepsis Labs: Recent Labs  Lab 11/10/18 1212  LATICACIDVEN 1.00    No results found for this or any previous visit (from the past 240 hour(s)).       Radiology Studies: No results found.  Scheduled Meds: . Adalimumab  1 Syringe Subcutaneous Q Wed  . budesonide  6 mg Oral Daily  . enoxaparin (LOVENOX) injection  40 mg Subcutaneous Q24H  . enzalutamide  160 mg Oral Daily  . mirtazapine  15 mg Oral QHS   Continuous Infusions: . sodium chloride 100 mL/hr at 11/16/18 0715     LOS: 5 days    [name], MD [facility] Page via [email or web address]  If 7PM-7AM, please contact night-coverage [email or web address] Password [facility] 11/16/2018, 2:33 PM

## 2018-11-16 NOTE — Care Management Important Message (Signed)
Important Message  Patient Details  Name: Daniel Howard MRN: [id number] Date of Birth: 05-27-1956   Medicare Important Message Given:  Yes    Abrielle Finck P Marg Macmaster 11/16/2018, 4:12 PM

## 2018-11-16 NOTE — Progress Notes (Signed)
Discharged home today. Personal belongings,discharged instructions,given to patient. Verbalized understanding of instructions.

## 2018-11-17 NOTE — Discharge Summary (Signed)
Physician Discharge Summary  SUDAIS BANGHART VZC:[id number] DOB: Dec 04, 1956 DOA: 11/10/2018  PCP: Alycia Rossetti, MD  Admit date: 11/10/2018 Discharge date: 11/16/2018  Time spent: 35 minutes  Recommendations for Outpatient Follow-up:  1. Gastroenterology Dr. Milton Ferguson in 10 days for CT enterography 2. PCP in 1 week   Discharge Diagnoses:  Principal Problem:   Partial small bowel obstruction ([place]) Active Problems:   Crohn's disease of both small and large intestine with complication (H[facility])   Prostate cancer ([place])   Colostomy status ([place])   SBO (small bowel obstruction) ([place])   Discharge Condition: Stable  Diet recommendation: Soft diet, small portion sizes  Filed Weights   11/10/18 1120  Weight: 64.9 kg    History of present illness: Daniel Weekes [name] an 62 y.o.malewith medical history significant forprostate cancer,recurrent and castration-resistant now, Crohn's diseasestatus post colostomy,recurrentsmall bowel obstructionsstatus post multiple abdominal surgeries and recentadmission forpartial small bowel obstruction last week who presents with worseningabdominal pain and decreased flatus.At his presentation to the hospital last week he had complained of abdominal pain, diarrhea and increased ostomy output. CT at that time showed partial small bowel obstruction with possible active Crohn's disease. He was evaluated by general surgery who felt that he did not have a surgical abdomen and thought he had a possible Crohn's flare. They also questionedadhesions versus chronic stricture. Gastroenterology was consulted who felt the patient could follow-up as an outpatient with Dr. Gala Romney in [place]. After bowel rest in the hospital symptoms resolved and he was tolerating a full soft diet discharge with no abdominal pain. He had a follow-up appointment withDr. Gala Romney yesterday and the plan was to get CTE to evaluate possiblebowel strictureseen on recent CT.  however, since his appointment, his abdominal pain has returned which he reports is a 10 out of 10 at its worst.  Patient was readmitted for partial SBO   Hospital Course:   Recurrent partial SBO:  adhesions versus chronic stricture -H/o prior multiple surgeries and Crohns dz with Ostomy status -Treated with bowel rest, NG decompression -Followed by general surgery -Also sees GI Dr. Gala Romney for Crohn's disease, on treatment with budesonide and Remicade, CT enterography planned in a few weeks as outpatient -Ambulate, advanced to soft diet today -Able to tolerate soft diet earlier today, discharged home in a stable condition -Advised to keep his upcoming appointment with Dr. Gala Romney for CT enterography  Crohn's disease: Followed by GI. -Continue home budesonide and immunomodulators.  Prostate cancer: Stable -resumed anti androgen therapy   Anemia of chronic disease  hemoglobin stable    Procedures:    Consultations:  General surgery  Discharge Exam: Vitals:   11/16/18 0656 11/16/18 1539  BP: 120/70 133/80  Pulse: 80 99  Resp: 16   Temp: 98.4 F (36.9 C) 98.8 F (37.1 C)  SpO2: 99% 100%    General: AAOx3 Cardiovascular: S1S2/RRR Respiratory: CTAB  Discharge Instructions   Discharge Instructions    Discharge instructions   Complete by:  As directed    Soft diet, small portion sizes, advance as tolerated   Increase activity slowly   Complete by:  As directed      Allergies as of 11/16/2018      Reactions   Penicillins Hives   Has patient had a PCN reaction causing immediate rash, facial/tongue/throat swelling, SOB or lightheadedness with hypotension: Yes Has patient had a PCN reaction causing severe rash involving mucus membranes or skin necrosis: No Has patient had a PCN reaction that required hospitalization: No Has patient  had a PCN reaction occurring within the last 10 years: No If all of the above answers are "NO", then may proceed with Cephalosporin  use.      Medication List    STOP taking these medications   HYDROcodone-acetaminophen 5-325 MG tablet Commonly known as:  NORCO/VICODIN     TAKE these medications   Adalimumab 40 MG/0.8ML Pnkt Inject 1 Syringe into the skin once a week. What changed:  when to take this   budesonide 3 MG 24 hr capsule Commonly known as:  ENTOCORT EC Take 6 mg by mouth daily.   calcium carbonate 600 MG Tabs tablet Commonly known as:  OS-CAL Take 1,200 mg by mouth daily with breakfast.   CENTRUM SILVER ADULT 50+ Tabs Take 1 tablet by mouth daily.   ferrous sulfate 325 (65 FE) MG tablet Take 325 mg by mouth daily with breakfast.   lidocaine-prilocaine cream Commonly known as:  EMLA Apply 1 application topically as needed (when accessing port).   mirtazapine 15 MG tablet Commonly known as:  REMERON TAKE 1 TABLET BY MOUTH AT BEDTIME   XTANDI 40 MG capsule Generic drug:  enzalutamide TAKE 4 CAPSULES (160 MG TOTAL) BY MOUTH DAILY.      Allergies  Allergen Reactions  . Penicillins Hives    Has patient had a PCN reaction causing immediate rash, facial/tongue/throat swelling, SOB or lightheadedness with hypotension: Yes Has patient had a PCN reaction causing severe rash involving mucus membranes or skin necrosis: No Has patient had a PCN reaction that required hospitalization: No Has patient had a PCN reaction occurring within the last 10 years: No If all of the above answers are "NO", then may proceed with Cephalosporin use.       The results of significant diagnostics from this hospitalization (including imaging, microbiology, ancillary and laboratory) are listed below for reference.    Significant Diagnostic Studies: Dg Chest 2 View  Result Date: 10/26/2018 CLINICAL DATA:  Shortness of breath, dizziness and headache for 1 day. EXAM: CHEST - 2 VIEW COMPARISON:  PA and lateral chest 06/28/2018 and 12/11/2017. FINDINGS: Port-A-Cath is unchanged. Lungs are clear. Heart size is  normal. There is no pneumothorax or pleural effusion. No acute or focal bony abnormality. IMPRESSION: No acute disease. Electronically Signed   By: Inge Rise M.D.   On: 10/26/2018 16:33   Dg Abd 1 View  Result Date: 11/12/2018 CLINICAL DATA:  Enteric tube placement, small-bowel obstruction EXAM: ABDOMEN - 1 VIEW COMPARISON:  CT abdomen/pelvis and abdominal radiograph from earlier today FINDINGS: Enteric tube terminates in proximal stomach. Moderately dilated small bowel loops throughout the abdomen, worsened from earlier today. No evidence of pneumatosis or pneumoperitoneum. Hernia repair mesh is noted in the right lower quadrant. No radiopaque nephrolithiasis. Excreted contrast is seen in the bladder. IMPRESSION: Enteric tube terminates in the proximal stomach. Moderately dilated small bowel loops throughout the abdomen, worsened compared to radiograph from earlier today, compatible with distal small bowel obstruction. Electronically Signed   By: Ilona Sorrel M.D.   On: 11/12/2018 11:06   Ct Abdomen Pelvis W Contrast  Result Date: 11/10/2018 CLINICAL DATA:  Abdominal pain with decrease flatus. History of bowel obstruction and ostomy. History of Crohn's disease and prostate cancer. EXAM: CT ABDOMEN AND PELVIS WITH CONTRAST TECHNIQUE: Multidetector CT imaging of the abdomen and pelvis was performed using the standard protocol following bolus administration of intravenous contrast. CONTRAST:  165m OMNIPAQUE IOHEXOL 300 MG/ML  SOLN COMPARISON:  Radiographs 10/01/2018.  CT 09/30/2018 and 11/01/2018. FINDINGS:  Lower chest: Mild dependent atelectasis at both lung bases. No significant pleural or pericardial effusion. Hepatobiliary: The liver is normal in density without focal abnormality. No evidence of gallstones, gallbladder wall thickening or biliary dilatation. Pancreas: Stable chronic pancreatic ductal dilatation. No evidence of pancreatic mass or surrounding inflammation. Spleen: There are stable  hypervascular lesions anteriorly in the spleen, measuring 10 mm on image 13 and 11 mm on image 22/3. These are probably incidental hemangiomas. The spleen is normal in size. Adrenals/Urinary Tract: Both adrenal glands appear normal. Stable probable small cyst anteriorly in the mid left kidney. The kidneys otherwise appear normal without evidence of urinary tract calculus or hydronephrosis. Small bladder calculi are again noted. No significant bladder wall thickening. Stomach/Bowel: The stomach is decompressed. Again demonstrated are multiple moderately dilated loops of small bowel within the mid abdomen. The patient is status post partial small bowel and proximal colon resection. The distal small bowel appears decompressed proximal to a right lower quadrant ileostomy and show slightly less hyperenhancement compared with the most recent study. A large left spigelian hernia containing multiple loops of small bowel is again noted, unchanged. The splenic flexure of the colon and proximal descending colon extend into this hernia. The distal colon is decompressed. Vascular/Lymphatic: Multiple enlarged retroperitoneal and mesenteric lymph nodes are again noted, similar to recent prior studies. There is a 14 mm left periaortic node on image 31/3. There is a right mesenteric node measuring 11 mm on image 44/3. No acute vascular findings. There is a retroaortic left renal vein. Reproductive: Right perirectal mass inseparable from the prostate gland is similar to the recent study, measuring approximately 6.6 x 2.7 cm on image 71/3. Other: No ascites, free air or peritoneal nodularity identified. Stable small right-sided lumbar hernia containing only fat. Musculoskeletal: Multiple sclerotic lesions are again noted within the pelvis consistent with osseous metastatic disease. There is a most prominent within the right iliac bone (2.2 cm on image 55/3) and in the right ischium. There is also a prominent lesion in the proximal  right femur which appears stable. No lytic lesion or pathologic fracture identified. IMPRESSION: 1. Little change is seen from the most recent study of 9 days ago. There is chronic moderate small bowel dilatation status post ileostomy, distal small bowel and proximal colon resection. Findings remain consistent with a low-grade partial bowel obstruction. Small bowel mucosal hyperenhancement is slightly improved from the recent study. 2. 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 3. No evidence of intra-abdominal abscess. 4. Stable lymphadenopathy, likely reactive. 5. Stable right pelvic mass, likely treated prostate cancer. Multifocal osseous metastatic disease. Electronically Signed   By: Richardean Sale M.D.   On: 11/10/2018 14:17   Ct Abdomen Pelvis W Contrast  Result Date: 11/01/2018 CLINICAL DATA:  62 year old male with a history of midline abdominal pain for 1 day. Diarrhea. History of prior obstructions. History of Crohn disease. EXAM: CT ABDOMEN AND PELVIS WITH CONTRAST TECHNIQUE: Multidetector CT imaging of the abdomen and pelvis was performed using the standard protocol following bolus administration of intravenous contrast. CONTRAST:  147m OMNIPAQUE IOHEXOL 300 MG/ML  SOLN COMPARISON:  CT 09/30/2018 FINDINGS: Lower chest: No acute abnormality. Hepatobiliary: Unremarkable appearance of liver. Unremarkable gallbladder. Pancreas: Unremarkable pancreas parenchyma. Redemonstration of pancreatic duct dilation with no radiopaque stones. Spleen: Unremarkable spleen Adrenals/Urinary Tract: Unremarkable adrenal glands. Unremarkable appearance of the kidneys with no hydronephrosis. No nephrolithiasis. Urinary bladder unchanged from the prior. High density material layered in the right aspect of the urinary bladder,  contralateral from the prior CT likely representing mobile stones. Stomach/Bowel: Small hiatal hernia.  Unremarkable stomach. Similar configuration of small bowel to the  comparison CT scan with multiple dilated small bowel loops and associated air-fluid levels. There are hyperenhancing and decompressed small bowel loops in the low abdomen, just superior to the ostomy. Associated increased vasculature of the mesenteric arterial system/venous system, best appreciated on the coronal reformatted images and compatible with a comb sign. There are at least 2 sites of persistent narrowing of small bowel when compared to the CT dated 09/30/2018, suggesting chronic strictures and may be related to the appearance of obstructed bowel. Stoma of the right lower quadrant. Similar appearance of the hernia sac of the left spigelian region containing multiple loops of small bowel. None of these are significantly dilated. Vascular/Lymphatic: No significant vascular calcifications. Multiple lymph nodes of the retroperitoneum are again visualized, most prominent on the left. Multiple enlarged lymph nodes of the right lower quadrant associated with the ileal colic mesentery. Reproductive: Mass of the right aspect of the pelvis, unchanged from prior measuring 2.7 cm x 5.5 cm on the current CT, relatively unchanged from prior. This is inseparable with the residual rectum in prostate. And is inseparable from the bladder base. Other: Surgical changes of the midline abdomen. Musculoskeletal: No acute displaced fracture. Redemonstration of sclerotic lesions of the bilateral iliac bones, pubic bone, bilateral ischial bones, right proximal femur. IMPRESSION: Multiple dilated small bowel loops with air-fluid levels of the abdomen, suggesting at least partial small bowel obstruction. The etiology is favored to be a combination of both acute and chronic changes of inflammatory bowel disease in this patient with given history of Crohn's disease. There are at least 2 focal short-segment regions of narrowing of small bowel in the right lower quadrant adjacent to the ostomy, which may represent chronic strictures,  given these are relatively similar when compared to the recent prior CT. Hyperenhancing bowel of the right lower quadrant with a vascular "comb sign" suggesting active Crohn's disease. Redemonstration of left spigelian hernia with multiple small bowel loops. Redemonstration of lymphadenopathy of the ileal colic mesentery and of retroperitoneal region. Redemonstration of pelvic sclerotic metastases and soft tissue in the right pelvis inseparable from prostate, rectum, and bladder base in this patient with given history of prostate carcinoma. Redemonstration of pancreatic ductal dilatation without associated inflammation. Electronically Signed   By: Corrie Mckusick D.O.   On: 11/01/2018 18:36   Ct Entero Abd/pelvis W Contast  Result Date: 11/12/2018 CLINICAL DATA:  Exacerbation of Crohn's disease. EXAM: CT ABDOMEN AND PELVIS WITH CONTRAST (ENTEROGRAPHY) TECHNIQUE: Multidetector CT of the abdomen and pelvis during bolus administration of intravenous contrast. Negative oral contrast was given. CONTRAST:  137m OMNIPAQUE IOHEXOL 300 MG/ML  SOLN COMPARISON:  11/10/2018 FINDINGS: Lower chest: Atelectasis in both lung bases. The esophagus is dilated and fluid-filled. This may be due to reflux or dysmotility. No esophageal wall thickening. Hepatobiliary: No masses or other significant abnormality. Pancreas: No mass, inflammatory changes, or other significant abnormality. Mildly dilated pancreatic duct, unchanged since prior study. Spleen: Spleen size is normal. Hyperenhancing focus in the inferior spleen measuring 10 mm diameter. Probable hemangioma. Adrenals/Urinary Tract: No adrenal gland nodules. Kidneys are symmetrical. Nephrograms are homogeneous. No hydronephrosis or hydroureter. Small stones layering in the bladder. No bladder wall thickening. No change. Stomach/Bowel: Stomach and small bowel are distended with fluid. Distal small bowel are decompressed. Some right lower quadrant small bowel loops demonstrate wall  thickening and enhancement compatible with history of Crohn  disease. Previous subtotal colectomy with right lower quadrant ileostomy. Left lower quadrant spigelian abdominal wall hernia containing multiple small bowel loops and splenic flexure of colon. This may represent a point of obstruction. Rectosigmoid colon is decompressed. Vascular/Lymphatic: Normal caliber abdominal aorta. Moderate lymphadenopathy demonstrated in the upper abdomen, retroperitoneum, mesentery, and right lower quadrant. Lymph nodes are similar in appearance to previous study and are likely reactive. Reproductive: Asymmetric prostate or right perirectal mass lesion with calcifications, extending to the base of the bladder and the right pelvic sidewall, measuring about 4 x 5.6 cm diameter. Similar appearance to previous study. This may represent a prostate mass. Other: No free air or free fluid in the abdomen. Right lumbar triangle hernia containing fat. Musculoskeletal: Degenerative changes in the spine. No destructive bone lesions. There are well-defined sclerotic lesions in the pelvis, right acetabulum, and right proximal femur possibly representing metastasis such as would be expected with prostate cancer. Correlation with PSA is recommended. IMPRESSION: 1. Esophagus and stomach are more distended today. Otherwise, no significant change since yesterday's study. 2. Diffuse dilatation of small bowel proximally with distal decompression. Left spigelian hernia containing small bowel and colon. This may form a point of obstruction. Inflammatory mucosal thickening intermittently in the small bowel consistent with history of Crohn disease. Multiple strictured segments are suggested. 3. Abdominal lymphadenopathy likely reactive related to history of Crohn disease. 4. No abscess or fistula demonstrated. 5. Asymmetric right perirectal/prostate mass. 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 6. Right lower quadrant ileostomy. 7. Right lumbar triangle  hernia containing fat. Electronically Signed   By: Lucienne Capers M.D.   On: 11/12/2018 00:37   Dg Abd 2 Views  Result Date: 11/14/2018 CLINICAL DATA:  Follow-up small bowel obstruction. EXAM: ABDOMEN - 2 VIEW COMPARISON:  11/13/2018 and prior exams FINDINGS: An NG tube with tip overlying the mid stomach is again noted. Dilated fluid and gas-filled loops of small bowel are again noted and not significantly changed. There is no evidence of pneumoperitoneum. IMPRESSION: No significant change in distended small bowel loops from 11/13/2018. Electronically Signed   By: Margarette Canada M.D.   On: 11/14/2018 09:17   Dg Abd Portable 1v  Result Date: 11/13/2018 CLINICAL DATA:  Followup small bowel obstruction. EXAM: PORTABLE ABDOMEN - 1 VIEW COMPARISON:  11/12/2018 radiograph, 11/11/2018 CT FINDINGS: NG tube with tip overlying the proximal-mid stomach again noted. Distended small bowel loops have slightly decreased in caliber. No other significant changes identified. IMPRESSION: Decreased distension of small bowel loops without other significant change. Electronically Signed   By: Margarette Canada M.D.   On: 11/13/2018 08:09   Dg Abd Portable 1v  Result Date: 11/11/2018 CLINICAL DATA:  Follow-up small bowel obstruction. EXAM: PORTABLE ABDOMEN - 1 VIEW COMPARISON:  11/10/2018 FINDINGS: Single view shows mild persistent small bowel dilatation consistent with persistent but probably improved partial small bowel obstruction. No worsening based on this 1 projection. IMPRESSION: Improved radiographic appearance. Mildly prominent small bowel loops, apparently improved since yesterday. Findings consistent with improved partial small bowel obstruction. Electronically Signed   By: Nelson Chimes M.D.   On: 11/11/2018 09:21   Dg Abd Portable 1v  Result Date: 11/10/2018 CLINICAL DATA:  Abdominal pain.  History of small-bowel obstruction. EXAM: PORTABLE ABDOMEN - 1 VIEW COMPARISON:  CT abdomen and pelvis November 10, 2018 at  1344 hours FINDINGS: A few loops of gas distended small bowel in LEFT lower quadrant. Contrast in the urinary collecting system, no hydronephrosis. RIGHT lower abdominal herniorrhaphy. No intra-abdominal mass effect or  pathologic calcifications. Soft tissue planes and included osseous structures are non suspicious. IMPRESSION: Non-specific bowel gas pattern. Contrast in the urinary collecting system. Electronically Signed   By: Elon Alas M.D.   On: 11/10/2018 22:24    Microbiology: No results found for this or any previous visit (from the past 240 hour(s)).   Labs: Basic Metabolic Panel: Recent Labs  Lab 11/11/18 0605 11/12/18 0245 11/15/18 1142  NA 135 137 138  K 3.9 3.8 3.7  CL 106 106 106  CO2 21* 23 23  GLUCOSE 71 117* 85  BUN 9 6 5*  CREATININE 1.16 1.01 0.99  CALCIUM 7.7* 7.9* 7.4*  MG  --   --  2.1   Liver Function Tests: Recent Labs  Lab 11/15/18 1142  AST 21  ALT 16  ALKPHOS 58  BILITOT 1.3*  PROT 6.7  ALBUMIN 2.7*   No results for input(s): LIPASE, AMYLASE in the last 168 hours. No results for input(s): AMMONIA in the last 168 hours. CBC: Recent Labs  Lab 11/11/18 0605 11/12/18 0245  WBC 8.2 6.1  HGB 11.4* 11.1*  HCT 35.8* 34.0*  MCV 97.8 95.5  PLT 333 378   Cardiac Enzymes: No results for input(s): CKTOTAL, CKMB, CKMBINDEX, TROPONINI in the last 168 hours. BNP: BNP (last 3 results) Recent Labs    06/27/18 0532  BNP 24.5    ProBNP (last 3 results) No results for input(s): PROBNP in the last 8760 hours.  CBG: No results for input(s): GLUCAP in the last 168 hours.     Signed:  [name] MD.  [facility] 11/17/2018, 3:08 PM

## 2018-11-19 MED FILL — XTANDI 40 MG CAPSULE: 40 | 30 days supply | Qty: 120 | Fill #0

## 2018-11-24 ENCOUNTER — Telehealth: Payer: Self-pay

## 2018-11-24 NOTE — Telephone Encounter (Signed)
PA for Humira was submitted through [email or web address]. Waiting on an approval or denial.

## 2018-11-24 NOTE — Telephone Encounter (Signed)
PA was approved for medication. When approval letter is faxed, it will be scanned in chart.

## 2018-12-01 ENCOUNTER — Inpatient Hospital Stay: Payer: Medicare Other | Attending: Oncology

## 2018-12-01 ENCOUNTER — Inpatient Hospital Stay: Payer: Medicare Other

## 2018-12-01 DIAGNOSIS — C61 Malignant neoplasm of prostate: Secondary | ICD-10-CM | POA: Diagnosis not present

## 2018-12-01 DIAGNOSIS — K5090 Crohn's disease, unspecified, without complications: Secondary | ICD-10-CM | POA: Insufficient documentation

## 2018-12-01 DIAGNOSIS — C7951 Secondary malignant neoplasm of bone: Secondary | ICD-10-CM | POA: Diagnosis not present

## 2018-12-01 DIAGNOSIS — Z79899 Other long term (current) drug therapy: Secondary | ICD-10-CM | POA: Insufficient documentation

## 2018-12-01 LAB — CBC WITH DIFFERENTIAL (CANCER CENTER ONLY)
ABS IMMATURE GRANULOCYTES: 0.02 10*3/uL (ref 0.00–0.07)
BASOS ABS: 0.1 10*3/uL (ref 0.0–0.1)
Basophils Relative: 1 %
Eosinophils Absolute: 0.2 10*3/uL (ref 0.0–0.5)
Eosinophils Relative: 3 %
HEMATOCRIT: 33.4 % — AB (ref 39.0–52.0)
Hemoglobin: 11 g/dL — ABNORMAL LOW (ref 13.0–17.0)
IMMATURE GRANULOCYTES: 0 %
LYMPHS ABS: 3.4 10*3/uL (ref 0.7–4.0)
Lymphocytes Relative: 46 %
MCH: 31.8 pg (ref 26.0–34.0)
MCHC: 32.9 g/dL (ref 30.0–36.0)
MCV: 96.5 fL (ref 80.0–100.0)
MONOS PCT: 6 %
Monocytes Absolute: 0.4 10*3/uL (ref 0.1–1.0)
NRBC: 0 % (ref 0.0–0.2)
Neutro Abs: 3.2 10*3/uL (ref 1.7–7.7)
Neutrophils Relative %: 44 %
PLATELETS: 395 10*3/uL (ref 150–400)
RBC: 3.46 MIL/uL — ABNORMAL LOW (ref 4.22–5.81)
RDW: 13.5 % (ref 11.5–15.5)
WBC Count: 7.2 10*3/uL (ref 4.0–10.5)

## 2018-12-01 LAB — CMP (CANCER CENTER ONLY)
ALBUMIN: 3.4 g/dL — AB (ref 3.5–5.0)
ALK PHOS: 50 U/L (ref 38–126)
ALT: 6 U/L (ref 0–44)
AST: 16 U/L (ref 15–41)
Anion gap: 10 (ref 5–15)
BUN: 13 mg/dL (ref 6–20)
CO2: 28 mmol/L (ref 22–32)
Calcium: 8.9 mg/dL (ref 8.9–10.3)
Chloride: 104 mmol/L (ref 98–111)
Creatinine: 1.02 mg/dL (ref 0.61–1.24)
GFR, Est AFR Am: >60 mL/min (ref >60)
GFR, Estimated: >60 mL/min (ref >60)
Glucose, Bld: 104 mg/dL — ABNORMAL HIGH (ref 70–99)
POTASSIUM: 3.2 mmol/L — AB (ref 3.5–5.1)
SODIUM: 142 mmol/L (ref 135–145)
Total Bilirubin: 0.7 mg/dL (ref 0.3–1.2)
Total Protein: 7.6 g/dL (ref 6.5–8.1)

## 2018-12-01 MED ORDER — HEPARIN SOD (PORK) LOCK FLUSH 100 UNIT/ML IV SOLN
500.0000 [IU] | Freq: Once | INTRAVENOUS | Status: AC | PRN
  Administered 2018-12-01: 500 [IU]
  Filled 2018-12-01: qty 5

## 2018-12-01 MED ORDER — SODIUM CHLORIDE 0.9% FLUSH
10.0000 mL | INTRAVENOUS | Status: DC | PRN
  Administered 2018-12-01: 10 mL
  Filled 2018-12-01: qty 10

## 2018-12-01 NOTE — Patient Instructions (Signed)
Implanted Port Home Guide An implanted port is a type of central line that is placed under the skin. Central lines are used to provide IV access when treatment or nutrition needs to be given through a person's veins. Implanted ports are used for long-term IV access. An implanted port may be placed because:  You need IV medicine that would be irritating to the small veins in your hands or arms.  You need long-term IV medicines, such as antibiotics.  You need IV nutrition for a long period.  You need frequent blood draws for lab tests.  You need dialysis.  Implanted ports are usually placed in the chest area, but they can also be placed in the upper arm, the abdomen, or the leg. An implanted port has two main parts:  Reservoir. The reservoir is round and will appear as a small, raised area under your skin. The reservoir is the part where a needle is inserted to give medicines or draw blood.  Catheter. The catheter is a thin, flexible tube that extends from the reservoir. The catheter is placed into a large vein. Medicine that is inserted into the reservoir goes into the catheter and then into the vein.  How will I care for my incision site? Do not get the incision site wet. Bathe or shower as directed by your health care provider. How is my port accessed? Special steps must be taken to access the port:  Before the port is accessed, a numbing cream can be placed on the skin. This helps numb the skin over the port site.  Your health care provider uses a sterile technique to access the port. ? Your health care provider must put on a mask and sterile gloves. ? The skin over your port is cleaned carefully with an antiseptic and allowed to dry. ? The port is gently pinched between sterile gloves, and a needle is inserted into the port.  Only "non-coring" port needles should be used to access the port. Once the port is accessed, a blood return should be checked. This helps ensure that the port  is in the vein and is not clogged.  If your port needs to remain accessed for a constant infusion, a clear (transparent) bandage will be placed over the needle site. The bandage and needle will need to be changed every week, or as directed by your health care provider.  Keep the bandage covering the needle clean and dry. Do not get it wet. Follow your health care provider's instructions on how to take a shower or bath while the port is accessed.  If your port does not need to stay accessed, no bandage is needed over the port.  What is flushing? Flushing helps keep the port from getting clogged. Follow your health care provider's instructions on how and when to flush the port. Ports are usually flushed with saline solution or a medicine called heparin. The need for flushing will depend on how the port is used.  If the port is used for intermittent medicines or blood draws, the port will need to be flushed: ? After medicines have been given. ? After blood has been drawn. ? As part of routine maintenance.  If a constant infusion is running, the port may not need to be flushed.  How long will my port stay implanted? The port can stay in for as long as your health care provider thinks it is needed. When it is time for the port to come out, surgery will be   done to remove it. The procedure is similar to the one performed when the port was put in. When should I seek immediate medical care? When you have an implanted port, you should seek immediate medical care if:  You notice a bad smell coming from the incision site.  You have swelling, redness, or drainage at the incision site.  You have more swelling or pain at the port site or the surrounding area.  You have a fever that is not controlled with medicine.  This information is not intended to replace advice given to you by your health care provider. Make sure you discuss any questions you have with your health care provider. Document  Released: 12/08/2005 Document Revised: 05/15/2016 Document Reviewed: 08/15/2013 Elsevier Interactive Patient Education  2017 [employer]vier Inc.  

## 2018-12-02 ENCOUNTER — Ambulatory Visit (HOSPITAL_COMMUNITY)
Admission: RE | Admit: 2018-12-02 | Discharge: 2018-12-02 | Disposition: A | Discharge status: Home / Self Care | Payer: Medicare Other | Source: Ambulatory Visit | Attending: Internal Medicine | Admitting: Internal Medicine

## 2018-12-02 ENCOUNTER — Encounter (HOSPITAL_COMMUNITY): Payer: Self-pay | Admitting: Emergency Medicine

## 2018-12-02 ENCOUNTER — Observation Stay (HOSPITAL_COMMUNITY)
Admission: EM | Admit: 2018-12-02 | Discharge: 2018-12-03 | Disposition: A | Discharge status: Home / Self Care | Payer: Medicare Other | Attending: Internal Medicine | Admitting: Internal Medicine

## 2018-12-02 DIAGNOSIS — R112 Nausea with vomiting, unspecified: Secondary | ICD-10-CM | POA: Diagnosis not present

## 2018-12-02 DIAGNOSIS — K50819 Crohn's disease of both small and large intestine with unspecified complications: Secondary | ICD-10-CM | POA: Diagnosis not present

## 2018-12-02 DIAGNOSIS — Z932 Ileostomy status: Secondary | ICD-10-CM | POA: Diagnosis not present

## 2018-12-02 DIAGNOSIS — Z8546 Personal history of malignant neoplasm of prostate: Secondary | ICD-10-CM | POA: Diagnosis not present

## 2018-12-02 DIAGNOSIS — N210 Calculus in bladder: Secondary | ICD-10-CM | POA: Diagnosis not present

## 2018-12-02 DIAGNOSIS — Z880 Allergy status to penicillin: Secondary | ICD-10-CM | POA: Diagnosis not present

## 2018-12-02 DIAGNOSIS — K56609 Unspecified intestinal obstruction, unspecified as to partial versus complete obstruction: Secondary | ICD-10-CM | POA: Insufficient documentation

## 2018-12-02 DIAGNOSIS — C61 Malignant neoplasm of prostate: Secondary | ICD-10-CM | POA: Diagnosis present

## 2018-12-02 DIAGNOSIS — Z79899 Other long term (current) drug therapy: Secondary | ICD-10-CM | POA: Diagnosis not present

## 2018-12-02 DIAGNOSIS — C7951 Secondary malignant neoplasm of bone: Secondary | ICD-10-CM

## 2018-12-02 DIAGNOSIS — R1084 Generalized abdominal pain: Secondary | ICD-10-CM | POA: Diagnosis not present

## 2018-12-02 DIAGNOSIS — K56600 Partial intestinal obstruction, unspecified as to cause: Secondary | ICD-10-CM | POA: Insufficient documentation

## 2018-12-02 DIAGNOSIS — R110 Nausea: Secondary | ICD-10-CM | POA: Diagnosis present

## 2018-12-02 LAB — URINALYSIS, ROUTINE W REFLEX MICROSCOPIC
BACTERIA UA: NONE SEEN
Bilirubin Urine: NEGATIVE
Glucose, UA: NEGATIVE mg/dL
KETONES UR: NEGATIVE mg/dL
LEUKOCYTES UA: NEGATIVE
Nitrite: NEGATIVE
PH: 5 (ref 5.0–8.0)
PROTEIN: NEGATIVE mg/dL
Specific Gravity, Urine: >1.046 — ABNORMAL HIGH (ref 1.005–1.030)

## 2018-12-02 LAB — CBC
HCT: 33.6 % — ABNORMAL LOW (ref 39.0–52.0)
HEMOGLOBIN: 10.8 g/dL — AB (ref 13.0–17.0)
MCH: 32 pg (ref 26.0–34.0)
MCHC: 32.1 g/dL (ref 30.0–36.0)
MCV: 99.4 fL (ref 80.0–100.0)
NRBC: 0 % (ref 0.0–0.2)
Platelets: 374 10*3/uL (ref 150–400)
RBC: 3.38 MIL/uL — AB (ref 4.22–5.81)
RDW: 13.3 % (ref 11.5–15.5)
WBC: 13.1 10*3/uL — ABNORMAL HIGH (ref 4.0–10.5)

## 2018-12-02 LAB — COMPREHENSIVE METABOLIC PANEL
ALBUMIN: 3.4 g/dL — AB (ref 3.5–5.0)
ALT: 13 U/L (ref 0–44)
ANION GAP: 8 (ref 5–15)
AST: 31 U/L (ref 15–41)
Alkaline Phosphatase: 39 U/L (ref 38–126)
BUN: 12 mg/dL (ref 6–20)
CO2: 27 mmol/L (ref 22–32)
CREATININE: 0.93 mg/dL (ref 0.61–1.24)
Calcium: 8.4 mg/dL — ABNORMAL LOW (ref 8.9–10.3)
Chloride: 99 mmol/L (ref 98–111)
GFR calc Af Amer: >60 mL/min (ref >60)
GFR calc non Af Amer: >60 mL/min (ref >60)
Glucose, Bld: 109 mg/dL — ABNORMAL HIGH (ref 70–99)
POTASSIUM: 3.8 mmol/L (ref 3.5–5.1)
SODIUM: 134 mmol/L — AB (ref 135–145)
Total Bilirubin: 1.2 mg/dL (ref 0.3–1.2)
Total Protein: 6.9 g/dL (ref 6.5–8.1)

## 2018-12-02 LAB — LIPASE, BLOOD: Lipase: 31 U/L (ref 11–51)

## 2018-12-02 MED ORDER — SODIUM CHLORIDE 0.9 % IV SOLN
INTRAVENOUS | Status: AC
  Administered 2018-12-03: 02:00:00 via INTRAVENOUS

## 2018-12-02 MED ORDER — SODIUM CHLORIDE 0.9 % IV BOLUS
1000.0000 mL | Freq: Once | INTRAVENOUS | Status: AC
  Administered 2018-12-02: 1000 mL via INTRAVENOUS

## 2018-12-02 MED ORDER — MORPHINE SULFATE (PF) 4 MG/ML IV SOLN
6.0000 mg | Freq: Once | INTRAVENOUS | Status: AC
  Administered 2018-12-02: 6 mg via INTRAVENOUS
  Filled 2018-12-02: qty 2

## 2018-12-02 MED ORDER — IOPAMIDOL (ISOVUE-300) INJECTION 61%
125.0000 mL | Freq: Once | INTRAVENOUS | Status: AC | PRN
  Administered 2018-12-02: 125 mL via INTRAVENOUS

## 2018-12-02 MED ORDER — MORPHINE SULFATE (PF) 4 MG/ML IV SOLN
4.0000 mg | Freq: Once | INTRAVENOUS | Status: AC
  Administered 2018-12-02: 4 mg via INTRAVENOUS
  Filled 2018-12-02: qty 1

## 2018-12-02 MED ORDER — ONDANSETRON HCL 4 MG/2ML IJ SOLN
4.0000 mg | Freq: Once | INTRAMUSCULAR | Status: AC
  Administered 2018-12-02: 4 mg via INTRAVENOUS
  Filled 2018-12-02: qty 2

## 2018-12-02 NOTE — ED Provider Notes (Signed)
[facility] DEPT Provider Note   CSN: [id number] Arrival date & time: 12/02/18  1847     History   Chief Complaint Chief Complaint  Patient presents with  . Emesis  . Abdominal Pain    HPI Daniel Howard is a 62 y.o. male.  62 year old male presents with several days of diffuse abdominal pain similar to his prior history of bowel obstruction.  Started having nonbilious emesis today.  Denies any fever or change to output to his stoma.  No urinary symptoms.  Saw his GI doctor yesterday who ordered an abdominal CT today which showed possible obstruction and patient sent here.     Past Medical History:  Diagnosis Date  . Abnormal finding of biliary tract    MRCP shows pancreatic/biliary tract dilation. EUS 2010 confirmed dilation but no chronic pancreaitis or mass. Vascular ectasia crimpoing distal CBD.   [name] Anxiety   . 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 Duodenal ulcer 2010   nsaids  . History of blood transfusion 1992   "related to colon OR"  . Peristomal hernia   . Prostate cancer ([place]) 2018  . SBO (small bowel obstruction) ([place]) 11/10/2018  . Small bowel obstruction ([place]) 10/2017; 11/21/2017; 02/16/2018  . Spigelian hernia    bilateral    Patient Active Problem List   Diagnosis Date Noted  . Partial small bowel obstruction ([place]) 11/01/2018  . SBO (small bowel obstruction) ([place]) 09/30/2018  . Iron deficiency anemia 07/03/2018  . 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 Nausea without vomiting 11/10/2017  . Incisional hernia 09/22/2017  . Abdominal pain of multiple sites 09/22/2017  . Goals of  care, counseling/discussion 04/02/2017  . Prostate cancer ([place]) 04/02/2017  . Elevated PSA 03/04/2017  . Osteopenia 11/03/2016  . Pelvic mass in male 09/04/2016  . 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 Exacerbation of Crohn's disease ([place]) 09/04/2016  . Protein-calorie malnutrition, severe ([place]) 07/27/2014  . Loss of weight 06/09/2014  . Crohn's disease of both small and large intestine with complication ([place]) 41/28/7867  . Boils 02/11/2013  . Ventral hernia 12/11/2009  . Anemia 12/03/2009  . Regional enteritis/Crohn's 01/25/2007    Past Surgical History:  Procedure Laterality Date  . APPENDECTOMY  1992   at time of exp laparotomy at which time he was noted to have fistulizing Crohn's rather than UC  . COLON SURGERY    . COLONOSCOPY [name] 08/23/2014   EHM:CNOBSJG proctoscopy with possible fistulous opening in thebase of rectal/anal stump.    . COLOSTOMY  1992   transverse loop colostomy secondary to a stricture  . ESOPHAGOGASTRODUODENOSCOPY  05/2009   SLF: multiple antral erosions, large ulcer at ansatomosis (postsurgical changes at duodenal bulb and second portion of duodenum) BX c/x NSAIDS.  . EUS  10/04/2009   Dr. Estill Bakes dilated CBD and main pancreatic duct.  No pancreatic  . EXPLORATORY LAPAROTOMY  1992  . FLEXIBLE SIGMOIDOSCOPY  1988   Dr. Laural Golden- suggested rohn's disease but the biopsies were not collaborative.  [name] FLEXIBLE SIGMOIDOSCOPY [name] 09/08/2016   Procedure: FLEXIBLE SIGMOIDOSCOPY;  Surgeon: Wonda Horner, MD;  Location: [facility] ENDOSCOPY;  Service: Gastroenterology;  Laterality: [name];  . [place]  R- Dr.DeMason   . HERNIA REPAIR  1996   incarcerated periostial hernia with additional surgery in 1999  . IR FLUORO GUIDE PORT INSERTION RIGHT  04/03/2017  . IR US GUIDE VASC ACCESS RIGHT  04/03/2017        Home Medications    Prior to Admission medications   Medication Sig Start Date End Date Taking? Authorizing Provider  Adalimumab (HUMIRA PEN)  40 MG/0.8ML PNKT Inject 1 Syringe into the skin once a week. Patient taking differently: Inject 1 Syringe into the skin every Wednesday.  09/01/18   Annitta Needs, NP  budesonide (ENTOCORT EC) 3 MG 24 hr capsule Take 6 mg by mouth daily.    [provider]  calcium carbonate (OS-CAL) 600 MG TABS tablet Take 1,200 mg by mouth daily with breakfast.    [provider]  ferrous sulfate 325 (65 FE) MG tablet Take 325 mg by mouth daily with breakfast.    [provider]  lidocaine-prilocaine (EMLA) cream Apply 1 application topically as needed (when accessing port).    [provider]  mirtazapine (REMERON) 15 MG tablet TAKE 1 TABLET BY MOUTH AT BEDTIME Patient taking differently: Take 15 mg by mouth at bedtime.  09/27/18   Cedro, [name], MD  Multiple Vitamins-Minerals (CENTRUM SILVER ADULT 50+) TABS Take 1 tablet by mouth daily.    [provider]  XTANDI 40 MG capsule TAKE 4 CAPSULES (160 MG TOTAL) BY MOUTH DAILY. 11/11/18   [name], MD    Family History Family History  Problem Relation Age of Onset  . Cancer Father        prostate   . Prostate cancer Father   . Colon cancer Father 2  . Hypertension Sister   . Cancer Sister   . Depression Sister   . Breast cancer Sister   . COPD Sister   . Aneurysm Brother        deceased, brain aneurysm    Social History Social History   Tobacco Use  . Smoking status: Former Smoker    Packs/day: 0.75    Years: 32.00    Pack years: 24.00    Types: Cigarettes    Last attempt to quit: 12/21/2009    Years since quitting: 8.9  . Smokeless tobacco: Never Used  Substance Use Topics  . Alcohol use: No    Alcohol/week: 0.0 standard drinks    Comment: Former drinker  . Drug use: No     Allergies   Penicillins   Review of Systems Review of Systems  All other systems reviewed and are negative.    Physical Exam Updated Vital Signs BP 125/79   Pulse 65   Temp (!) 97.5 F (36.4 C)  (Oral)   Resp 18   SpO2 98%   Physical Exam Vitals signs and nursing note reviewed.  Constitutional:      General: He is not in acute distress.    Appearance: He is cachectic. He is not toxic-appearing.  HENT:     Head: Normocephalic and atraumatic.  Eyes:     General: Lids are normal.     Conjunctiva/sclera: Conjunctivae normal.     Pupils: Pupils are equal, round, and reactive to light.  Neck:     Musculoskeletal: Normal range of motion and neck supple.     Thyroid: No thyroid mass.     Trachea: No tracheal deviation.  Cardiovascular:     Rate and Rhythm: Normal rate and regular rhythm.  Heart sounds: Normal heart sounds. No murmur. No gallop.   Pulmonary:     Effort: Pulmonary effort is normal. No respiratory distress.     Breath sounds: Normal breath sounds. No stridor. No decreased breath sounds, wheezing, rhonchi or rales.  Abdominal:     General: Bowel sounds are normal. There is no distension.     Palpations: Abdomen is soft.     Tenderness: There is no abdominal tenderness. There is no rebound.    Musculoskeletal: Normal range of motion.        General: No tenderness.  Skin:    General: Skin is warm and dry.     Findings: No abrasion or rash.  Neurological:     Mental Status: He is alert and oriented to person, place, and time.     GCS: GCS eye subscore is 4. GCS verbal subscore is 5. GCS motor subscore is 6.     Cranial Nerves: No cranial nerve deficit.     Sensory: No sensory deficit.  Psychiatric:        Attention and Perception: He is inattentive.        Mood and Affect: Affect is blunt.        Speech: Speech normal.        Behavior: Behavior is withdrawn.      ED Treatments / Results  Labs (all labs ordered are listed, but only abnormal results are displayed) Labs Reviewed  URINALYSIS, ROUTINE W REFLEX MICROSCOPIC  CBC  COMPREHENSIVE METABOLIC PANEL  LIPASE, BLOOD    EKG None  Radiology Ct Entero Abd/pelvis W Contast  Result Date:  12/02/2018 CLINICAL DATA:  Chronic abdominal pain. Recent small bowel obstructions with spontaneous resolution. Prior Spigelian hernias, prior hemicolectomy remotely. History of Crohn's disease. History of prostate cancer. EXAM: CT ABDOMEN AND PELVIS WITH CONTRAST (ENTEROGRAPHY) TECHNIQUE: Multidetector CT of the abdomen and pelvis during bolus administration of intravenous contrast. Negative oral contrast was given. CONTRAST:  158m ISOVUE-300 IOPAMIDOL (ISOVUE-300) INJECTION 61% COMPARISON:  11/11/2018 FINDINGS: Lower chest:  Unremarkable Hepatobiliary: Unremarkable Pancreas: Dilated dorsal pancreatic duct at 5 mm, chronic. A specific cause is not identified. Spleen: 10 mm enhancing nodule the upper lateral spleen, stable. 12 by 10 mm enhancing nodule in the lower spleen on image 30/2, stable. Both of the splenic lesions demonstrate mild delayed enhancement. These lesions have slowly enlarged since 2011, and may well represent hemangiomas. Adrenals/Urinary Tract: The adrenal glands and kidneys appear normal. Suspected bladder calculus 0.7 by 0.3 cm on image 72/2. This sits near the left UVJ. Stomach/Bowel: Complex appearance of left abdominal peristomal hernia. There appear to be some nondilated loops of small bowel and also some dilated loops of small bowel within this peristomal hernia, with 1 loop measuring 3.7 cm in diameter on image 41/2. There is also a more inferiorly located dilated loop with mild mucosal enhancement measuring 4.6 cm in diameter on image 58/2, which may well be attaching to a loop of bowel which extends down into the pelvis to the anus. In turn this loop of bowel has soft tissue density along its right margin possibly from scarring or some form of mass, measuring about 6.0 by 4.0 cm on image 72/5, previously measured about the same. Although I am not entirely certain, that same loop may be flattened and extending along the margin of the peristomal hernia towards the cutaneous surface in  what may be an area of scarring or a true stoma. There is edema and stranding in the associated herniated  omental adipose tissues which is increased from 11/11/2018. There is abnormal mucosal enhancement in the right abdominal loop extending to the right-sided stoma, for example on images 17 through 27 of series 7, which may represent active Crohn's disease. There is also a small loop of bowel in the right Spigelian hernia on image 33/3, without clear signs of direct obstruction related to this upper right abdominal hernia. Regarding the intra-abdominal loops of small bowel, there appear to be focal areas of accentuated mucosal enhancement, such as along a 3.7 cm collapsed loop in the right lower quadrant on image 20/7, suggesting skip lesions and active Crohn's disease. Some intra-abdominal loops are dilated up to 5.5 cm in the right upper quadrant on image 34/7. Vascular/Lymphatic: Retroperitoneal adenopathy noted with a 1.5 cm left periaortic lymph node on image 33/2 (formerly 1.4 cm) other adjacent prominent lymph nodes. Suspected venous varices adjacent to both sciatic nerves outside of the pelvis. Reproductive: The prostate gland, if still present, is inseparable from the right perirectal mass, and indistinct. Other: Unremarkable Musculoskeletal: Right lumbar hernia containing adipose tissue, image 33/2. Peristomal and right Spigelian hernias as described above. Sclerotic lesions in the right intertrochanteric femur and bony pelvis are chronic could be a manifestation of osseous metastatic disease. Fused sacroiliac joints. IMPRESSION: 1. The patient has multiple hernias and a confusing configuration of the bowel. There appear to be multiple loops of bowel in a large left peristomal hernia. I am uncertain whether there is actually a functional stoma on the left side or not; there is a band of density that extends to the cutaneous surface resembling a stoma with a collapsed loop of bowel, but this could also  simply be from scarring, correlate with any functional stoma on the left. This appears to be connected to a dilated loop of bowel which in turn extends into the abdomen, down into the pelvis, and to the anus, where there is a large but stable right perirectal mass confluent with any residual prostate tissue, not appreciably changed from prior. This seems like a counterintuitive set of connections, and it may be that the flattened density believed to represent extending to a left-sided stoma might simply be from scarring. There are 2 loops of dilated bowel in the left-sided peristomal hernia, along with some bowel wall thickening suggesting inflammation which could be related to the patient's Crohn's disease or bowel ischemia. No extraluminal gas or pneumatosis. 2. In the right abdomen there is a well-defined stoma, with loops of small bowel extending to it with accentuated mucosal enhancement favoring skip lesions of Crohn's disease. In addition, there is a small Spigelian hernia on the right containing a small knuckle of small bowel, without definite obstruction. There are dilated loops of intra-small bowel up to about 5.5 cm with bowel wall thickening, and the cause for the inflammation could be Crohn's enteritis but is not entirely specific given the overall complex appearance. 3. Compared to the prior exam, there is some mild increase in edema in the left-sided hernia. 4. Stable retroperitoneal adenopathy, cause uncertain. 5. Chronic dilation of the dorsal pancreatic duct. A stricture in the vicinity of the ampulla is not excluded. I do not see an obvious mass on CT. 6. Right lumbar hernia contains adipose tissue only. 7. Sclerotic lesions in the bony pelvis and right proximal femur are stable and probably from metastatic prostate cancer. 8. Enhancing splenic nodules, chronically stable probably from hemangiomas. 9. 7 by 3 mm bladder calculus. Electronically Signed   By: Van Clines  M.D.   On: 12/02/2018  15:32    Procedures Procedures (including critical care time)  Medications Ordered in ED Medications  sodium chloride 0.9 % bolus 1,000 mL (has no administration in time range)  0.9 %  sodium chloride infusion (has no administration in time range)  ondansetron (ZOFRAN) injection 4 mg (has no administration in time range)  morphine 4 MG/ML injection 6 mg (has no administration in time range)     Initial Impression / Assessment and Plan / ED Course  I have reviewed the triage vital signs and the nursing notes.  Pertinent labs & imaging results that were available during my care of the patient were reviewed by me and considered in my medical decision making (see chart for details).     Patient given IV fluids and pain meds.  Review the old chart shows that patient admitted for similar symptoms 3 weeks ago.  Patient continues to have pain and likely could be suffering from enteritis versus early obstruction.  Will admit to the hospitalist service  Final Clinical Impressions(s) / ED Diagnoses   Final diagnoses:  None    ED Discharge Orders    None       Lacretia Leigh, MD 12/02/18 2357

## 2018-12-02 NOTE — ED Triage Notes (Signed)
Pt having abd pains and vomiting that started today. Family states that has hx bowel blockage.

## 2018-12-03 ENCOUNTER — Inpatient Hospital Stay (HOSPITAL_BASED_OUTPATIENT_CLINIC_OR_DEPARTMENT_OTHER): Payer: Medicare Other | Admitting: Oncology

## 2018-12-03 ENCOUNTER — Other Ambulatory Visit: Payer: Self-pay

## 2018-12-03 ENCOUNTER — Telehealth: Payer: Self-pay | Admitting: Oncology

## 2018-12-03 VITALS — BP 118/77 | HR 79 | Temp 98.2°F | Resp 18 | Ht 64.5 in | Wt 142.9 lb

## 2018-12-03 DIAGNOSIS — K5090 Crohn's disease, unspecified, without complications: Secondary | ICD-10-CM | POA: Diagnosis not present

## 2018-12-03 DIAGNOSIS — K50819 Crohn's disease of both small and large intestine with unspecified complications: Secondary | ICD-10-CM | POA: Diagnosis not present

## 2018-12-03 DIAGNOSIS — Z79899 Other long term (current) drug therapy: Secondary | ICD-10-CM | POA: Diagnosis not present

## 2018-12-03 DIAGNOSIS — C7951 Secondary malignant neoplasm of bone: Secondary | ICD-10-CM | POA: Diagnosis not present

## 2018-12-03 DIAGNOSIS — C61 Malignant neoplasm of prostate: Secondary | ICD-10-CM

## 2018-12-03 DIAGNOSIS — K56609 Unspecified intestinal obstruction, unspecified as to partial versus complete obstruction: Secondary | ICD-10-CM | POA: Insufficient documentation

## 2018-12-03 LAB — BASIC METABOLIC PANEL
Anion gap: 7 (ref 5–15)
BUN: 12 mg/dL (ref 6–20)
CALCIUM: 7.9 mg/dL — AB (ref 8.9–10.3)
CO2: 26 mmol/L (ref 22–32)
CREATININE: 0.96 mg/dL (ref 0.61–1.24)
Chloride: 104 mmol/L (ref 98–111)
GFR calc Af Amer: >60 mL/min (ref >60)
GFR calc non Af Amer: >60 mL/min (ref >60)
Glucose, Bld: 89 mg/dL (ref 70–99)
Potassium: 3.4 mmol/L — ABNORMAL LOW (ref 3.5–5.1)
Sodium: 137 mmol/L (ref 135–145)

## 2018-12-03 LAB — CBC
HCT: 30.9 % — ABNORMAL LOW (ref 39.0–52.0)
Hemoglobin: 10 g/dL — ABNORMAL LOW (ref 13.0–17.0)
MCH: 31.8 pg (ref 26.0–34.0)
MCHC: 32.4 g/dL (ref 30.0–36.0)
MCV: 98.4 fL (ref 80.0–100.0)
Platelets: 346 10*3/uL (ref 150–400)
RBC: 3.14 MIL/uL — ABNORMAL LOW (ref 4.22–5.81)
RDW: 13.7 % (ref 11.5–15.5)
WBC: 5.8 10*3/uL (ref 4.0–10.5)
nRBC: 0 % (ref 0.0–0.2)

## 2018-12-03 MED ORDER — ONDANSETRON HCL 4 MG PO TABS: 4.0000 mg | ORAL_TABLET | Freq: Four times a day (QID) | ORAL | Status: DC | PRN

## 2018-12-03 MED ORDER — ENOXAPARIN SODIUM 40 MG/0.4ML ~~LOC~~ SOLN
40.0000 mg | SUBCUTANEOUS | Status: DC
  Filled 2018-12-03: qty 0.4

## 2018-12-03 MED ORDER — ADULT MULTIVITAMIN W/MINERALS CH
1.0000 | ORAL_TABLET | Freq: Every day | ORAL | Status: DC
  Administered 2018-12-03: 1 via ORAL
  Filled 2018-12-03: qty 1

## 2018-12-03 MED ORDER — ENZALUTAMIDE 40 MG PO CAPS: 160.0000 mg | ORAL_CAPSULE | Freq: Every day | ORAL | Status: DC

## 2018-12-03 MED ORDER — HYDROCODONE-ACETAMINOPHEN 5-325 MG PO TABS: 1.0000 | ORAL_TABLET | Freq: Four times a day (QID) | ORAL | Status: DC | PRN

## 2018-12-03 MED ORDER — BUDESONIDE 3 MG PO CPEP
6.0000 mg | ORAL_CAPSULE | Freq: Every day | ORAL | Status: DC
  Administered 2018-12-03: 6 mg via ORAL
  Filled 2018-12-03: qty 2

## 2018-12-03 MED ORDER — ACETAMINOPHEN 650 MG RE SUPP: 650.0000 mg | Freq: Four times a day (QID) | RECTAL | Status: DC | PRN

## 2018-12-03 MED ORDER — CALCIUM CARBONATE 1250 (500 CA) MG PO TABS
1250.0000 mg | ORAL_TABLET | Freq: Every day | ORAL | Status: DC
  Administered 2018-12-03: 1250 mg via ORAL
  Filled 2018-12-03: qty 1

## 2018-12-03 MED ORDER — ONDANSETRON HCL 4 MG/2ML IJ SOLN: 4.0000 mg | Freq: Four times a day (QID) | INTRAMUSCULAR | Status: DC | PRN

## 2018-12-03 MED ORDER — MIRTAZAPINE 30 MG PO TABS: 15.0000 mg | ORAL_TABLET | Freq: Every day | ORAL | Status: DC

## 2018-12-03 MED ORDER — BUDESONIDE 3 MG PO CPEP: 9.0000 mg | ORAL_CAPSULE | Freq: Every day | ORAL | 0 refills | Status: DC

## 2018-12-03 MED ORDER — POTASSIUM CHLORIDE CRYS ER 20 MEQ PO TBCR
40.0000 meq | EXTENDED_RELEASE_TABLET | Freq: Once | ORAL | Status: AC
  Administered 2018-12-03: 40 meq via ORAL
  Filled 2018-12-03: qty 2

## 2018-12-03 MED ORDER — CENTRUM SILVER ADULT 50+ PO TABS: 1.0000 | ORAL_TABLET | Freq: Every day | ORAL | Status: DC

## 2018-12-03 MED ORDER — FERROUS SULFATE 325 (65 FE) MG PO TABS
325.0000 mg | ORAL_TABLET | Freq: Every day | ORAL | Status: DC
  Administered 2018-12-03: 325 mg via ORAL
  Filled 2018-12-03: qty 1

## 2018-12-03 MED ORDER — ACETAMINOPHEN 325 MG PO TABS: 650.0000 mg | ORAL_TABLET | Freq: Four times a day (QID) | ORAL | Status: DC | PRN

## 2018-12-03 NOTE — ED Notes (Signed)
This RN informed by Lynelle Smoke in [facility] that this pt will not be admitted upstairs, pt can be D/C'd from ED if CBC and CMP are good, and pt tolerates soft food diet.

## 2018-12-03 NOTE — Progress Notes (Signed)
Hematology and Oncology Follow Up Visit  Daniel Howard [id number] March 29, 1956 62 y.o. 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    Prior Therapy:   He is status post biopsy on 03/16/2017 of a pelvic mass which confirmed the presence of prostate cancer.  Taxotere chemotherapy at 75 mg/m started on 04/24/2017. He S/P 6 cycles of therapy completed in August 2018.  Current therapy:  Androgen deprivation therapy ongoing at [facility].  Xtandi 160 mg daily started in November 2019.  Interim History: Daniel Howard presents today for a follow-up.  Since last visit, he was hospitalized on multiple occasions between November and December 2019 with a recurrent small bowel obstruction as well as nausea and vomiting.  He was seen in the emergency department last night and was discharged earlier this morning.  During his evaluation he had a CT scan of the abdomen and pelvis which did not show any acute pathology.  Since his discharge today, he feels well without any complaints.  He has tolerated Xtandi without any complications.  He denies excessive fatigue, tiredness.  He is no longer reporting any nausea or vomiting at this time.  He did report abdominal distention which has resolved at this time.   He does not report any headaches, blurry vision, syncope or seizures.  He denies any alteration in mental status or syncope.  He does not report any fevers, chills, sweats or weight loss. He does not report any chest pain, palpitation, orthopnea or leg edema. He does not report any cough, wheezing or hemoptysis.  He denies any satiety, hematochezia or melena..  He does not report any frequency urgency or hesitancy. He is not report any arthralgias or myalgias.  He does not report any skin rashes or lesions.  He denies any mood changes.  He  denies any bleeding or clotting tendency.  Remaining review of systems is negative  Medications: I have reviewed the patient's current medications.  No current facility-administered medications for this visit.    Current Outpatient Medications  Medication Sig Dispense Refill  . Adalimumab (HUMIRA PEN) 40 MG/0.8ML PNKT Inject 1 Syringe into the skin once a week. (Patient taking differently: Inject 1 Syringe into the skin every Wednesday. ) 4 each 3  . budesonide (ENTOCORT EC) 3 MG 24 hr capsule Take 3 capsules (9 mg total) by mouth daily. 90 capsule 0  . calcium carbonate (OS-CAL) 600 MG TABS tablet Take 1,200 mg by mouth daily with breakfast.    . ferrous sulfate 325 (65 FE) MG tablet Take 325 mg by mouth daily with breakfast.    . HYDROcodone-acetaminophen (NORCO/VICODIN) 5-325 MG tablet Take 1 tablet by mouth every 6 (six) hours as needed for moderate pain.    [name] lidocaine-prilocaine (EMLA) cream Apply 1 application topically as needed (when accessing port).    . mirtazapine (REMERON) 15 MG tablet TAKE 1 TABLET BY MOUTH AT BEDTIME (Patient taking differently: Take 15 mg by mouth at bedtime. ) 90 tablet 1  . Multiple Vitamins-Minerals (CENTRUM SILVER ADULT 50+) TABS Take 1 tablet by mouth daily.    Daniel Howard 40 MG capsule TAKE 4 CAPSULES (160 MG TOTAL) BY MOUTH DAILY. (Patient taking differently: Take 160 mg by mouth daily. ) 120 capsule 0   Facility-Administered Medications Ordered in Other Visits  Medication Dose Route Frequency Provider Last Rate Last Dose  . acetaminophen (TYLENOL) tablet  650 mg  650 mg Oral Q6H PRN Lenore Cordia, MD       Or  . acetaminophen (TYLENOL) suppository 650 mg  650 mg Rectal Q6H PRN Zada Finders R, MD      . budesonide (ENTOCORT EC) 24 hr capsule 6 mg  6 mg Oral Daily Zada Finders R, MD   6 mg at 12/03/18 0935  . calcium carbonate (OS-CAL - dosed in mg of elemental calcium) tablet 1,250 mg  1,250 mg Oral Q breakfast Zada Finders R, MD   1,250 mg at 12/03/18  0935  . enoxaparin (LOVENOX) injection 40 mg  40 mg Subcutaneous Q24H Zada Finders R, MD      . enzalutamide Daniel Howard) capsule 160 mg  160 mg Oral Daily Zada Finders R, MD      . ferrous sulfate tablet 325 mg  325 mg Oral Q breakfast Lenore Cordia, MD   325 mg at 12/03/18 0935  . HYDROcodone-acetaminophen (NORCO/VICODIN) 5-325 MG per tablet 1 tablet  1 tablet Oral Q6H PRN Zada Finders R, MD      . mirtazapine (REMERON) tablet 15 mg  15 mg Oral QHS Zada Finders R, MD      . multivitamin with minerals tablet 1 tablet  1 tablet Oral Daily Bonnielee Haff, MD   1 tablet at 12/03/18 0935  . ondansetron (ZOFRAN) tablet 4 mg  4 mg Oral Q6H PRN Lenore Cordia, MD       Or  . ondansetron (ZOFRAN) injection 4 mg  4 mg Intravenous Q6H PRN Zada Finders R, MD      . sodium chloride flush (NS) 0.9 % injection 10 mL  10 mL Intravenous PRN [name], MD   10 mL at 11/20/17 5361     Allergies:  Allergies  Allergen Reactions  . Penicillins Hives    Has patient had a PCN reaction causing immediate rash, facial/tongue/throat swelling, SOB or lightheadedness with hypotension: Yes Has patient had a PCN reaction causing severe rash involving mucus membranes or skin necrosis: No Has patient had a PCN reaction that required hospitalization: No Has patient had a PCN reaction occurring within the last 10 years: No If all of the above answers are "NO", then may proceed with Cephalosporin use.     Past Medical History, Surgical history, Social history, and Family History remained the same on review today.  Physical Exam:  Blood pressure 118/77, pulse 79, temperature 98.2 F (36.8 C), temperature source Oral, resp. rate 18, height 5' 4.5" (1.638 m), weight 142 lb 14.4 oz (64.8 kg), SpO2 100 %.   ECOG: 1   General appearance: Alert, awake without any distress. Head: Atraumatic without abnormalities Oropharynx: Without any thrush or ulcers. Eyes: No scleral icterus. Lymph nodes: No  lymphadenopathy noted in the cervical, supraclavicular, or axillary nodes Heart:regular rate and rhythm, without any murmurs or gallops.   Lung: Clear to auscultation without any rhonchi, wheezes or dullness to percussion. Abdomin:  Slightly distended with good bowel sounds.  No rebound or guarding.  No shifting dullness or ascites. Musculoskeletal: No clubbing or cyanosis. Neurological: No motor or sensory deficits. Skin: No rashes or lesions.    Lab Results: Lab Results  Component Value Date   WBC 5.8 12/03/2018   HGB 10.0 (L) 12/03/2018   HCT 30.9 (L) 12/03/2018   MCV 98.4 12/03/2018   PLT 346 12/03/2018     Chemistry      Component Value Date/Time   NA 137 12/03/2018 0944  NA 137 11/20/2017 0931   K 3.4 (L) 12/03/2018 0944   K 3.3 (L) 11/20/2017 0931   CL 104 12/03/2018 0944   CO2 26 12/03/2018 0944   CO2 27 11/20/2017 0931   BUN 12 12/03/2018 0944   BUN 16.6 11/20/2017 0931   CREATININE 0.96 12/03/2018 0944   CREATININE 1.02 12/01/2018 0920   CREATININE 0.99 12/02/2017 1249   CREATININE 1.2 11/20/2017 0931      Component Value Date/Time   CALCIUM 7.9 (L) 12/03/2018 0944   CALCIUM 8.9 11/20/2017 0931   ALKPHOS 39 12/02/2018 1954   ALKPHOS 62 11/20/2017 0931   AST 31 12/02/2018 1954   AST 16 12/01/2018 0920   AST 38 (H) 11/20/2017 0931   ALT 13 12/02/2018 1954   ALT 6 12/01/2018 0920   ALT 45 11/20/2017 0931   BILITOT 1.2 12/02/2018 1954   BILITOT 0.7 12/01/2018 0920   BILITOT 0.66 11/20/2017 0931       Impression and Plan:   62 year old man with:  1.  Castration resistant prostate cancer with disease to the bone and lymphadenopathy.  He remains on Xtandi at this time without any complications.  Risks and benefits of continuing this therapy long-term was reviewed and is agreeable to continue.  We will continue to monitor his PSA periodically.  His laboratory data from 12/02/2018 were reviewed and showed no acute pathology.   2. IV access:  Port-A-Cath will remain in place and flushed periodically.  3.  Crohn's disease: No recent exacerbation noted.  4. Bone directed therapy: I recommended continuing this injection indefinitely.  He is currently receiving it under the care of [facility].  5. Androgen deprivation therapy: He continues to receive that under the care of [facility].  He has tolerated it well without any complaints  6. Follow-up: Will be in 7 weeks to follow his progress.  15 minutes were spent face-to-face with the patient today.  More than 50% were spent on reviewing the natural course of his disease, treatment options and long-term complications related to his therapy.   Zola Button, MD 12/13/20191:14 PM

## 2018-12-03 NOTE — Discharge Summary (Signed)
[facility]  Physician Discharge Summary   Patient ID: Daniel Howard MRN: [id number] DOB/AGE: 28-Jan-1956 62 y.o.  Admit date: 12/02/2018 Discharge date: 12/03/2018  PCP: Alycia Rossetti, MD  DISCHARGE DIAGNOSES:  Nausea and vomiting, resolved History of Crohn's disease History of metastatic prostate cancer  RECOMMENDATIONS FOR OUTPATIENT FOLLOW UP: 1. Outpatient follow-up with his gastroenterologist in [place], Dr. Gala Romney   DISCHARGE CONDITION: fair  Diet recommendation: Low residue diet, small portions  INITIAL HISTORY: 62 y.o. male with medical history significant for Complicated Crohn's disease s/p multiple surgeries including partial colectomy with ostomy in place, recurrent partial small bowel obstructions, spigelian hernia, and metastatic prostate cancer who presented to the ED after undergoing CT enterography (findings as documented below) ordered by his GI physician (Dr. Gala Romney) and reportedly sent to the ED afterwards.  Patient had a recent admission from 11/20-11/26/19 for recurrent partial small bowel obstruction treated with bowel rest and nasogastric decompression.  He has been on Humira and budesonide for his Crohn's disease and Xtandi for his prostate cancer.  He says he has been doing fairly well since admission as he has been sticking to small portions of soft food diet.  He says he had some mild abdominal pain earlier this morning which has resolved.  He says after drinking a large amount of negative oral contrast for his enterography he developed frequent semisoft stools without overt diarrhea or hematochezia/melena.  He had some nausea without emesis.  He has not eaten anything since this morning.  He reports some chills without fevers.  He has occasional lightheadedness when standing up too quickly, however he generally tries to take his time when getting up.  He denies any syncope or fall.  He denies any chest pain, dyspnea, dysuria, or  swelling.   ED Course:  Initial vitals in the ED showed BP 103/92, pulse 65, RR 18, temp 97.62F, SPO2 100% on room air.  Labs are notable for WBC 13.1, hemoglobin 10.8, lipase 31.  Chemistry panel was largely unremarkable.  Urinalysis was negative for UTI.  He was given 1 L normal saline bolus and started on maintenance fluids afterwards.  He was also given IV morphine and IV Zofran with relief of pain and nausea.  The hospital service was consulted to admit for potential partial small bowel obstruction.   Consultations:  Phone discussion with Dr. Gala Romney  Procedures:  None    HOSPITAL COURSE:   Crohn's disease with complication of recurrent partial small bowel obstruction s/p multiple surgeries including partial colectomy with ostomy in place: Very complicated Crohn's history with CT enterography findings as above. Crohn's is treated with Humira and budesonide, follows with GI Dr. Gala Romney.  Patient developed the symptoms after he took oral contrast for a CT scan.  He was given antiemetics with a rapid improvement.  He was observed overnight.  He feels much better this morning.  He has ambulated.  He has tolerated his breakfast and lunch.  His abnormal CT findings discussed with his gastroenterologist, Dr. Gala Romney.  A lot of his findings are due to his complicated anatomy from previous surgeries and history of Crohn's disease.  His examination does not suggest active Crohn's disease at this time.  Dr. Gala Romney recommends 9 mg of budesonide orally once daily.  Patient should continue with his Humira.  Dr. Gala Romney will see arrange for the patient to be seen in his office and will consider referral to [facility] for surgical consultation.   Metastatic prostate cancer: Follows with Dr.  Shadad. Continue Xtandi.  Overall stable.  Okay for discharge home today.     PERTINENT LABS:  The results of significant diagnostics from this hospitalization (including imaging, microbiology,  ancillary and laboratory) are listed below for reference.     Labs: Basic Metabolic Panel: Recent Labs  Lab 12/01/18 0920 12/02/18 1954 12/03/18 0944  NA 142 134* 137  K 3.2* 3.8 3.4*  CL 104 99 104  CO2 28 27 26   GLUCOSE 104* 109* 89  BUN 13 12 12   CREATININE 1.02 0.93 0.96  CALCIUM 8.9 8.4* 7.9*   Liver Function Tests: Recent Labs  Lab 12/01/18 0920 12/02/18 1954  AST 16 31  ALT 6 13  ALKPHOS 50 39  BILITOT 0.7 1.2  PROT 7.6 6.9  ALBUMIN 3.4* 3.4*   Recent Labs  Lab 12/02/18 1954  LIPASE 31   CBC: Recent Labs  Lab 12/01/18 0920 12/02/18 1950 12/03/18 0944  WBC 7.2 13.1* 5.8  NEUTROABS 3.2  --   --   HGB 11.0* 10.8* 10.0*  HCT 33.4* 33.6* 30.9*  MCV 96.5 99.4 98.4  PLT 395 374 346    IMAGING STUDIES Dg Abd 1 View  Result Date: 11/12/2018 CLINICAL DATA:  Enteric tube placement, small-bowel obstruction EXAM: ABDOMEN - 1 VIEW COMPARISON:  CT abdomen/pelvis and abdominal radiograph from earlier today FINDINGS: Enteric tube terminates in proximal stomach. Moderately dilated small bowel loops throughout the abdomen, worsened from earlier today. No evidence of pneumatosis or pneumoperitoneum. Hernia repair mesh is noted in the right lower quadrant. No radiopaque nephrolithiasis. Excreted contrast is seen in the bladder. IMPRESSION: Enteric tube terminates in the proximal stomach. Moderately dilated small bowel loops throughout the abdomen, worsened compared to radiograph from earlier today, compatible with distal small bowel obstruction. Electronically Signed   By: Ilona Sorrel M.D.   On: 11/12/2018 11:06   Ct Abdomen Pelvis W Contrast  Result Date: 11/10/2018 CLINICAL DATA:  Abdominal pain with decrease flatus. History of bowel obstruction and ostomy. History of Crohn's disease and prostate cancer. EXAM: CT ABDOMEN AND PELVIS WITH CONTRAST TECHNIQUE: Multidetector CT imaging of the abdomen and pelvis was performed using the standard protocol following bolus  administration of intravenous contrast. CONTRAST:  175m OMNIPAQUE IOHEXOL 300 MG/ML  SOLN COMPARISON:  Radiographs 10/01/2018.  CT 09/30/2018 and 11/01/2018. FINDINGS: Lower chest: Mild dependent atelectasis at both lung bases. No significant pleural or pericardial effusion. Hepatobiliary: The liver is normal in density without focal abnormality. No evidence of gallstones, gallbladder wall thickening or biliary dilatation. Pancreas: Stable chronic pancreatic ductal dilatation. No evidence of pancreatic mass or surrounding inflammation. Spleen: There are stable hypervascular lesions anteriorly in the spleen, measuring 10 mm on image 13 and 11 mm on image 22/3. These are probably incidental hemangiomas. The spleen is normal in size. Adrenals/Urinary Tract: Both adrenal glands appear normal. Stable probable small cyst anteriorly in the mid left kidney. The kidneys otherwise appear normal without evidence of urinary tract calculus or hydronephrosis. Small bladder calculi are again noted. No significant bladder wall thickening. Stomach/Bowel: The stomach is decompressed. Again demonstrated are multiple moderately dilated loops of small bowel within the mid abdomen. The patient is status post partial small bowel and proximal colon resection. The distal small bowel appears decompressed proximal to a right lower quadrant ileostomy and show slightly less hyperenhancement compared with the most recent study. A large left spigelian hernia containing multiple loops of small bowel is again noted, unchanged. The splenic flexure of the colon and proximal descending colon extend  into this hernia. The distal colon is decompressed. Vascular/Lymphatic: Multiple enlarged retroperitoneal and mesenteric lymph nodes are again noted, similar to recent prior studies. There is a 14 mm left periaortic node on image 31/3. There is a right mesenteric node measuring 11 mm on image 44/3. No acute vascular findings. There is a retroaortic left  renal vein. Reproductive: Right perirectal mass inseparable from the prostate gland is similar to the recent study, measuring approximately 6.6 x 2.7 cm on image 71/3. Other: No ascites, free air or peritoneal nodularity identified. Stable small right-sided lumbar hernia containing only fat. Musculoskeletal: Multiple sclerotic lesions are again noted within the pelvis consistent with osseous metastatic disease. There is a most prominent within the right iliac bone (2.2 cm on image 55/3) and in the right ischium. There is also a prominent lesion in the proximal right femur which appears stable. No lytic lesion or pathologic fracture identified. IMPRESSION: 1. Little change is seen from the most recent study of 9 days ago. There is chronic moderate small bowel dilatation status post ileostomy, distal small bowel and proximal colon resection. Findings remain consistent with a low-grade partial bowel obstruction. Small bowel mucosal hyperenhancement is slightly improved from the recent study. 2. 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 3. No evidence of intra-abdominal abscess. 4. Stable lymphadenopathy, likely reactive. 5. Stable right pelvic mass, likely treated prostate cancer. Multifocal osseous metastatic disease. Electronically Signed   By: Richardean Sale M.D.   On: 11/10/2018 14:17   Ct Entero Abd/pelvis W Contast  Result Date: 12/02/2018 CLINICAL DATA:  Chronic abdominal pain. Recent small bowel obstructions with spontaneous resolution. Prior Spigelian hernias, prior hemicolectomy remotely. History of Crohn's disease. History of prostate cancer. EXAM: CT ABDOMEN AND PELVIS WITH CONTRAST (ENTEROGRAPHY) TECHNIQUE: Multidetector CT of the abdomen and pelvis during bolus administration of intravenous contrast. Negative oral contrast was given. CONTRAST:  145m ISOVUE-300 IOPAMIDOL (ISOVUE-300) INJECTION 61% COMPARISON:  11/11/2018 FINDINGS: Lower chest:  Unremarkable  Hepatobiliary: Unremarkable Pancreas: Dilated dorsal pancreatic duct at 5 mm, chronic. A specific cause is not identified. Spleen: 10 mm enhancing nodule the upper lateral spleen, stable. 12 by 10 mm enhancing nodule in the lower spleen on image 30/2, stable. Both of the splenic lesions demonstrate mild delayed enhancement. These lesions have slowly enlarged since 2011, and may well represent hemangiomas. Adrenals/Urinary Tract: The adrenal glands and kidneys appear normal. Suspected bladder calculus 0.7 by 0.3 cm on image 72/2. This sits near the left UVJ. Stomach/Bowel: Complex appearance of left abdominal peristomal hernia. There appear to be some nondilated loops of small bowel and also some dilated loops of small bowel within this peristomal hernia, with 1 loop measuring 3.7 cm in diameter on image 41/2. There is also a more inferiorly located dilated loop with mild mucosal enhancement measuring 4.6 cm in diameter on image 58/2, which may well be attaching to a loop of bowel which extends down into the pelvis to the anus. In turn this loop of bowel has soft tissue density along its right margin possibly from scarring or some form of mass, measuring about 6.0 by 4.0 cm on image 72/5, previously measured about the same. Although I am not entirely certain, that same loop may be flattened and extending along the margin of the peristomal hernia towards the cutaneous surface in what may be an area of scarring or a true stoma. There is edema and stranding in the associated herniated omental adipose tissues which is increased from 11/11/2018. There is abnormal  mucosal enhancement in the right abdominal loop extending to the right-sided stoma, for example on images 17 through 27 of series 7, which may represent active Crohn's disease. There is also a small loop of bowel in the right Spigelian hernia on image 33/3, without clear signs of direct obstruction related to this upper right abdominal hernia. Regarding the  intra-abdominal loops of small bowel, there appear to be focal areas of accentuated mucosal enhancement, such as along a 3.7 cm collapsed loop in the right lower quadrant on image 20/7, suggesting skip lesions and active Crohn's disease. Some intra-abdominal loops are dilated up to 5.5 cm in the right upper quadrant on image 34/7. Vascular/Lymphatic: Retroperitoneal adenopathy noted with a 1.5 cm left periaortic lymph node on image 33/2 (formerly 1.4 cm) other adjacent prominent lymph nodes. Suspected venous varices adjacent to both sciatic nerves outside of the pelvis. Reproductive: The prostate gland, if still present, is inseparable from the right perirectal mass, and indistinct. Other: Unremarkable Musculoskeletal: Right lumbar hernia containing adipose tissue, image 33/2. Peristomal and right Spigelian hernias as described above. Sclerotic lesions in the right intertrochanteric femur and bony pelvis are chronic could be a manifestation of osseous metastatic disease. Fused sacroiliac joints. IMPRESSION: 1. The patient has multiple hernias and a confusing configuration of the bowel. There appear to be multiple loops of bowel in a large left peristomal hernia. I am uncertain whether there is actually a functional stoma on the left side or not; there is a band of density that extends to the cutaneous surface resembling a stoma with a collapsed loop of bowel, but this could also simply be from scarring, correlate with any functional stoma on the left. This appears to be connected to a dilated loop of bowel which in turn extends into the abdomen, down into the pelvis, and to the anus, where there is a large but stable right perirectal mass confluent with any residual prostate tissue, not appreciably changed from prior. This seems like a counterintuitive set of connections, and it may be that the flattened density believed to represent extending to a left-sided stoma might simply be from scarring. There are 2 loops of  dilated bowel in the left-sided peristomal hernia, along with some bowel wall thickening suggesting inflammation which could be related to the patient's Crohn's disease or bowel ischemia. No extraluminal gas or pneumatosis. 2. In the right abdomen there is a well-defined stoma, with loops of small bowel extending to it with accentuated mucosal enhancement favoring skip lesions of Crohn's disease. In addition, there is a small Spigelian hernia on the right containing a small knuckle of small bowel, without definite obstruction. There are dilated loops of intra-small bowel up to about 5.5 cm with bowel wall thickening, and the cause for the inflammation could be Crohn's enteritis but is not entirely specific given the overall complex appearance. 3. Compared to the prior exam, there is some mild increase in edema in the left-sided hernia. 4. Stable retroperitoneal adenopathy, cause uncertain. 5. Chronic dilation of the dorsal pancreatic duct. A stricture in the vicinity of the ampulla is not excluded. I do not see an obvious mass on CT. 6. Right lumbar hernia contains adipose tissue only. 7. Sclerotic lesions in the bony pelvis and right proximal femur are stable and probably from metastatic prostate cancer. 8. Enhancing splenic nodules, chronically stable probably from hemangiomas. 9. 7 by 3 mm bladder calculus. Electronically Signed   By: Van Clines M.D.   On: 12/02/2018 15:32   Ct Entero  Abd/pelvis W Contast  Result Date: 11/12/2018 CLINICAL DATA:  Exacerbation of Crohn's disease. EXAM: CT ABDOMEN AND PELVIS WITH CONTRAST (ENTEROGRAPHY) TECHNIQUE: Multidetector CT of the abdomen and pelvis during bolus administration of intravenous contrast. Negative oral contrast was given. CONTRAST:  130m OMNIPAQUE IOHEXOL 300 MG/ML  SOLN COMPARISON:  11/10/2018 FINDINGS: Lower chest: Atelectasis in both lung bases. The esophagus is dilated and fluid-filled. This may be due to reflux or dysmotility. No esophageal  wall thickening. Hepatobiliary: No masses or other significant abnormality. Pancreas: No mass, inflammatory changes, or other significant abnormality. Mildly dilated pancreatic duct, unchanged since prior study. Spleen: Spleen size is normal. Hyperenhancing focus in the inferior spleen measuring 10 mm diameter. Probable hemangioma. Adrenals/Urinary Tract: No adrenal gland nodules. Kidneys are symmetrical. Nephrograms are homogeneous. No hydronephrosis or hydroureter. Small stones layering in the bladder. No bladder wall thickening. No change. Stomach/Bowel: Stomach and small bowel are distended with fluid. Distal small bowel are decompressed. Some right lower quadrant small bowel loops demonstrate wall thickening and enhancement compatible with history of Crohn disease. Previous subtotal colectomy with right lower quadrant ileostomy. Left lower quadrant spigelian abdominal wall hernia containing multiple small bowel loops and splenic flexure of colon. This may represent a point of obstruction. Rectosigmoid colon is decompressed. Vascular/Lymphatic: Normal caliber abdominal aorta. Moderate lymphadenopathy demonstrated in the upper abdomen, retroperitoneum, mesentery, and right lower quadrant. Lymph nodes are similar in appearance to previous study and are likely reactive. Reproductive: Asymmetric prostate or right perirectal mass lesion with calcifications, extending to the base of the bladder and the right pelvic sidewall, measuring about 4 x 5.6 cm diameter. Similar appearance to previous study. This may represent a prostate mass. Other: No free air or free fluid in the abdomen. Right lumbar triangle hernia containing fat. Musculoskeletal: Degenerative changes in the spine. No destructive bone lesions. There are well-defined sclerotic lesions in the pelvis, right acetabulum, and right proximal femur possibly representing metastasis such as would be expected with prostate cancer. Correlation with PSA is recommended.  IMPRESSION: 1. Esophagus and stomach are more distended today. Otherwise, no significant change since yesterday's study. 2. Diffuse dilatation of small bowel proximally with distal decompression. Left spigelian hernia containing small bowel and colon. This may form a point of obstruction. Inflammatory mucosal thickening intermittently in the small bowel consistent with history of Crohn disease. Multiple strictured segments are suggested. 3. Abdominal lymphadenopathy likely reactive related to history of Crohn disease. 4. No abscess or fistula demonstrated. 5. Asymmetric right perirectal/prostate mass. 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 6. Right lower quadrant ileostomy. 7. Right lumbar triangle hernia containing fat. Electronically Signed   By: WLucienne CapersM.D.   On: 11/12/2018 00:37   Dg Abd 2 Views  Result Date: 11/14/2018 CLINICAL DATA:  Follow-up small bowel obstruction. EXAM: ABDOMEN - 2 VIEW COMPARISON:  11/13/2018 and prior exams FINDINGS: An NG tube with tip overlying the mid stomach is again noted. Dilated fluid and gas-filled loops of small bowel are again noted and not significantly changed. There is no evidence of pneumoperitoneum. IMPRESSION: No significant change in distended small bowel loops from 11/13/2018. Electronically Signed   By: JMargarette CanadaM.D.   On: 11/14/2018 09:17   Dg Abd Portable 1v  Result Date: 11/13/2018 CLINICAL DATA:  Followup small bowel obstruction. EXAM: PORTABLE ABDOMEN - 1 VIEW COMPARISON:  11/12/2018 radiograph, 11/11/2018 CT FINDINGS: NG tube with tip overlying the proximal-mid stomach again noted. Distended small bowel loops have slightly decreased in caliber. No other significant changes identified. IMPRESSION:  Decreased distension of small bowel loops without other significant change. Electronically Signed   By: Margarette Canada M.D.   On: 11/13/2018 08:09   Dg Abd Portable 1v  Result Date: 11/11/2018 CLINICAL DATA:  Follow-up small bowel obstruction.  EXAM: PORTABLE ABDOMEN - 1 VIEW COMPARISON:  11/10/2018 FINDINGS: Single view shows mild persistent small bowel dilatation consistent with persistent but probably improved partial small bowel obstruction. No worsening based on this 1 projection. IMPRESSION: Improved radiographic appearance. Mildly prominent small bowel loops, apparently improved since yesterday. Findings consistent with improved partial small bowel obstruction. Electronically Signed   By: Nelson Chimes M.D.   On: 11/11/2018 09:21   Dg Abd Portable 1v  Result Date: 11/10/2018 CLINICAL DATA:  Abdominal pain.  History of small-bowel obstruction. EXAM: PORTABLE ABDOMEN - 1 VIEW COMPARISON:  CT abdomen and pelvis November 10, 2018 at 1344 hours FINDINGS: A few loops of gas distended small bowel in LEFT lower quadrant. Contrast in the urinary collecting system, no hydronephrosis. RIGHT lower abdominal herniorrhaphy. No intra-abdominal mass effect or pathologic calcifications. Soft tissue planes and included osseous structures are non suspicious. IMPRESSION: Non-specific bowel gas pattern. Contrast in the urinary collecting system. Electronically Signed   By: Elon Alas M.D.   On: 11/10/2018 22:24    DISCHARGE EXAMINATION: Vitals:   12/03/18 1130 12/03/18 1145 12/03/18 1200 12/03/18 1249  BP:   106/72 (!) 99/56  Pulse: 76 (!) 104 91 79  Resp:    16  Temp:    98.7 F (37.1 C)  TempSrc:    Oral  SpO2: 99% 100% 96% 98%   General appearance: alert, cooperative, appears stated age and no distress Resp: clear to auscultation bilaterally Cardio: regular rate and rhythm, S1, S2 normal, no murmur, click, rub or gallop GI: Abdomen is soft.  Ostomy is noted with stable findings per patient.  Abdomen is soft.  Nontender.  Bowel sounds present.  scars from previous surgeries.  DISPOSITION: Home  Discharge Instructions    Call MD for:  difficulty breathing, headache or visual disturbances   Complete by:  As directed    Call MD for:   extreme fatigue   Complete by:  As directed    Call MD for:  persistant dizziness or light-headedness   Complete by:  As directed    Call MD for:  persistant nausea and vomiting   Complete by:  As directed    Call MD for:  severe uncontrolled pain   Complete by:  As directed    Call MD for:  temperature >100.4   Complete by:  As directed    Discharge instructions   Complete by:  As directed    Please be sure to call your gastroenterologist office to schedule follow-up.  He might consider referring you to [facility] for further evaluation.  Seek attention immediately if your symptoms recur.  Eat a low residue diet, small portions.  You were cared for by a hospitalist during your hospital stay. If you have any questions about your discharge medications or the care you received while you were in the hospital after you are discharged, you can call the unit and asked to speak with the hospitalist on call if the hospitalist that took care of you is not available. Once you are discharged, your primary care physician will handle any further medical issues. Please note that NO REFILLS for any discharge medications will be authorized once you are discharged, as it is imperative that you return to your primary care  physician (or establish a relationship with a primary care physician if you do not have one) for your aftercare needs so that they can reassess your need for medications and monitor your lab values. If you do not have a primary care physician, you can call [phone] for a physician referral.   Increase activity slowly   Complete by:  As directed         Allergies as of 12/03/2018      Reactions   Penicillins Hives   Has patient had a PCN reaction causing immediate rash, facial/tongue/throat swelling, SOB or lightheadedness with hypotension: Yes Has patient had a PCN reaction causing severe rash involving mucus membranes or skin necrosis: No Has patient had a PCN reaction that required  hospitalization: No Has patient had a PCN reaction occurring within the last 10 years: No If all of the above answers are "NO", then may proceed with Cephalosporin use.      Medication List    TAKE these medications   Adalimumab 40 MG/0.8ML Pnkt Commonly known as:  HUMIRA PEN Inject 1 Syringe into the skin once a week. What changed:  when to take this   budesonide 3 MG 24 hr capsule Commonly known as:  ENTOCORT EC Take 3 capsules (9 mg total) by mouth daily. What changed:  how much to take   calcium carbonate 600 MG Tabs tablet Commonly known as:  OS-CAL Take 1,200 mg by mouth daily with breakfast.   CENTRUM SILVER ADULT 50+ Tabs Take 1 tablet by mouth daily.   ferrous sulfate 325 (65 FE) MG tablet Take 325 mg by mouth daily with breakfast.   HYDROcodone-acetaminophen 5-325 MG tablet Commonly known as:  NORCO/VICODIN Take 1 tablet by mouth every 6 (six) hours as needed for moderate pain.   lidocaine-prilocaine cream Commonly known as:  EMLA Apply 1 application topically as needed (when accessing port).   mirtazapine 15 MG tablet Commonly known as:  REMERON TAKE 1 TABLET BY MOUTH AT BEDTIME   XTANDI 40 MG capsule Generic drug:  enzalutamide TAKE 4 CAPSULES (160 MG TOTAL) BY MOUTH DAILY. What changed:  See the new instructions.        Follow-up Information    Rourk, Cristopher Estimable, MD Follow up.   Specialty:  Gastroenterology Why:  Call his office to schedule appointment Contact information: [address] [place] [address] [phone]           TOTAL DISCHARGE TIME: 35 minutes  Bonnielee Haff  [facility] Pager [phone]  12/03/2018, 2:18 PM

## 2018-12-03 NOTE — Discharge Instructions (Signed)
Crohn Disease Crohn disease is a long-lasting (chronic) disease that affects your gastrointestinal (GI) tract. It often causes irritation and swelling (inflammation) in your small intestine and the beginning of your large intestine. However, it can affect any part of your GI tract. Crohn disease is part of a group of illnesses that are known as inflammatory bowel disease (IBD). Crohn disease may start slowly and get worse over time. Symptoms may come and go. They may also disappear for months or even years at a time (remission). What are the causes? The exact cause of Crohn disease is not known. It may be a response that causes your body's defense system (immune system) to mistakenly attack healthy cells and tissues (autoimmune response). Your genes and your environment may also play a role. What increases the risk? You may be at greater risk for Crohn disease if you:  Have other family members with Crohn disease or another IBD.  Use any tobacco products, including cigarettes, chewing tobacco, or electronic cigarettes.  Are in your 16s.  Have [place] European ancestry.  What are the signs or symptoms? The main signs and symptoms of Crohn disease involve your GI tract. These include:  Diarrhea.  Rectal bleeding.  An urgent need to move your bowels.  The feeling that you are not finished having a bowel movement.  Abdominal pain or cramping.  Constipation.  General signs and symptoms of Crohn disease may also include:  Unexplained weight loss.  Fatigue.  Fever.  Nausea.  Loss of appetite.  Joint pain  Changes in vision.  Red bumps on your skin.  How is this diagnosed? Your health care provider may suspect Crohn disease based on your symptoms and your medical history. Your health care provider will do a physical exam. You may need to see a health care provider who specializes in diseases of the digestive tract (gastroenterologist). You may also have tests to help your  health care providers make a diagnosis. These may include:  Blood tests.  Stool sample tests.  Imaging tests, such as X-rays and CT scans.  Tests to examine the inside of your intestines using a long, flexible tube that has a light and a camera on the end (endoscopy or colonoscopy).  A procedure to take tissue samples from inside your bowel (biopsy) to be examined under a microscope.  How is this treated? There is no cure for Crohn disease. Treatment will focus on managing your symptoms. Crohn disease affects each person differently. Your treatment may include:  Resting your bowels. Drinking only clear liquids or getting nutrition through an IV for a period of time gives your bowels a chance to heal because they are not passing stools.  Medicines. These may be used alone or in combination (combination therapy). These may include antibiotic medicines. You may be given medicines that help to: ? Reduce inflammation. ? Control your immune system activity. ? Fight infections. ? Relieve cramps and prevent diarrhea. ? Control your pain.  Surgery. You may need surgery if: ? Medicines and other treatments are no longer working. ? You develop complications from severe Crohn disease. ? A section of your intestine becomes so damaged that it needs to be removed.  Follow these instructions at home:  Take medicines only as directed by your health care provider.  If you were prescribed an antibiotic medicine, finish it all even if you start to feel better.  Keep all follow-up visits as directed by your health care provider. This is important.  Talk with your  health care provider about changing your diet. This may help your symptoms. Your health care provide may recommend changes, such as: ? Drinking more fluids. ? Avoiding milk and other foods that contain lactose. ? Eating a low-fat diet. ? Avoiding high-fiber foods, such as popcorn and nuts. ? Avoiding carbonated beverages, such as  soda. ? Eating smaller meals more often rather than eating large meals. ? Keeping a food diary to identify foods that make your symptoms better or worse.  Do not use any tobacco products, including cigarettes, chewing tobacco, or electronic cigarettes. If you need help quitting, ask your health care provider.  Limit alcohol intake to no more than 1 drink per day for nonpregnant women and 2 drinks per day for men. One drink equals 12 ounces of beer, 5 ounces of wine, or 1 ounces of hard liquor.  Exercise daily or as directed by your health care provider. Contact a health care provider if:  You have diarrhea, abdominal cramps, and other gastrointestinal problems that are present almost all of the time.  Your symptoms do not improve with treatment.  You continue to lose weight.  You develop a rash or sores on your skin.  You develop eye problems.  You have a fever.  Your symptoms get worse.  You develop new symptoms. Get help right away if:  You have bloody diarrhea.  You develop severe abdominal pain.  You cannot pass stools. This information is not intended to replace advice given to you by your health care provider. Make sure you discuss any questions you have with your health care provider. Document Released: 09/17/2005 Document Revised: 04/17/2016 Document Reviewed: 07/26/2014 Elsevier Interactive Patient Education  2018 [employer].

## 2018-12-03 NOTE — ED Notes (Signed)
Pt tolerated his lunch well

## 2018-12-03 NOTE — ED Notes (Signed)
Pt given applesauce, tray ordered

## 2018-12-03 NOTE — Telephone Encounter (Signed)
Printed calendar and avs. °

## 2018-12-03 NOTE — ED Notes (Signed)
Pt given chicken noodle soup.  Pt tolerated applesauce well

## 2018-12-03 NOTE — H&P (Signed)
**Note Daniel Howard** History and Physical    [name] TTS:[id number] DOB: 1956-12-07 DOA: 12/02/2018  PCP: Alycia Rossetti, MD  Patient coming from: Home  I have personally briefly reviewed patient's old medical records in [place]  Chief Complaint: Nausea  HPI: Daniel Howard is a 62 y.o. male with medical history significant for Complicated Crohn's disease s/p multiple surgeries including partial colectomy with ostomy in place, recurrent partial small bowel obstructions, spigelian hernia, and metastatic prostate cancer who presents to the ED after undergoing CT enterography (findings as documented below) ordered by his GI physician (Dr. Gala Romney) and reportedly sent to the ED afterwards.  Patient had a recent admission from 11/20-11/26/19 for recurrent partial small bowel obstruction treated with bowel rest and nasogastric decompression.  He has been on Humira and budesonide for his Crohn's disease and Xtandi for his prostate cancer.  He says he has been doing fairly well since admission as he has been sticking to small portions of soft food diet.  He says he had some mild abdominal pain earlier this morning which has resolved.  He says after drinking a large amount of negative oral contrast for his enterography he developed frequent semisoft stools without overt diarrhea or hematochezia/melena.  He had some nausea without emesis.  He has not eaten anything since this morning.  He reports some chills without fevers.  He has occasional lightheadedness when standing up too quickly, however he generally tries to take his time when getting up.  He denies any syncope or fall.  He denies any chest pain, dyspnea, dysuria, or swelling.   ED Course:  Initial vitals in the ED showed BP 103/92, pulse 65, RR 18, temp 97.19F, SPO2 100% on room air.  Labs are notable for WBC 13.1, hemoglobin 10.8, lipase 31.  Chemistry panel was largely unremarkable.  Urinalysis was negative for UTI.  He was given 1 L normal  saline bolus and started on maintenance fluids afterwards.  He was also given IV morphine and IV Zofran with relief of pain and nausea.  The hospital service was consulted to admit for potential partial small bowel obstruction.   CT enterography 12/02/2018 radiology read: 1. The patient has multiple hernias and a confusing configuration of the bowel. There appear to be multiple loops of bowel in a large left peristomal hernia. I am uncertain whether there is actually a functional stoma on the left side or not; there is a band of density that extends to the cutaneous surface resembling a stoma with a collapsed loop of bowel, but this could also simply be from scarring, correlate with any functional stoma on the left. This appears to be connected to a dilated loop of bowel which in turn extends into the abdomen, down into the pelvis, and to the anus, where there is a large but stable right perirectal mass confluent with any residual prostate tissue, not appreciably changed from prior. This seems like a counterintuitive set of connections, and it may be that the flattened density believed to represent extending to a left-sided stoma might simply be from scarring. There are 2 loops of dilated bowel in the left-sided peristomal hernia, along with some bowel wall thickening suggesting inflammation which could be related to the patient's Crohn's disease or bowel ischemia. No extraluminal gas or pneumatosis. 2. In the right abdomen there is a well-defined stoma, with loops of small bowel extending to it with accentuated mucosal enhancement favoring skip lesions of Crohn's disease. In addition, there is a small Spigelian  hernia on the right containing a small knuckle of small bowel, without definite obstruction. There are dilated loops of intra-small bowel up to about 5.5 cm with bowel wall thickening, and the cause for the inflammation could be Crohn's enteritis but is not entirely specific  given the overall complex appearance. 3. Compared to the prior exam, there is some mild increase in edema in the left-sided hernia. 4. Stable retroperitoneal adenopathy, cause uncertain. 5. Chronic dilation of the dorsal pancreatic duct. A stricture in the vicinity of the ampulla is not excluded. I do not see an obvious mass on CT. 6. Right lumbar hernia contains adipose tissue only. 7. Sclerotic lesions in the bony pelvis and right proximal femur are stable and probably from metastatic prostate cancer. 8. Enhancing splenic nodules, chronically stable probably from hemangiomas. 9. 7 by 3 mm bladder calculus.   Review of Systems: As per HPI otherwise 10 point review of systems negative.    Past Medical History:  Diagnosis Date  . Abnormal finding of biliary tract    MRCP shows pancreatic/biliary tract dilation. EUS 2010 confirmed dilation but no chronic pancreaitis or mass. Vascular ectasia crimpoing distal CBD.   [name] Anxiety   . 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 Duodenal ulcer 2010   nsaids  . History of blood transfusion 1992   "related to colon OR"  . Peristomal hernia   . Prostate cancer ([place]) 2018  . SBO (small bowel obstruction) ([place]) 11/10/2018  . Small bowel obstruction ([place]) 10/2017; 11/21/2017; 02/16/2018  . Spigelian hernia    bilateral    Past Surgical History:  Procedure Laterality Date  . APPENDECTOMY  1992   at time of exp laparotomy at which time he was noted to have fistulizing Crohn's rather than UC  . COLON SURGERY    . COLONOSCOPY [name] 08/23/2014   [facility]:[id number] proctoscopy with possible fistulous opening in thebase  of rectal/anal stump.    . COLOSTOMY  1992   transverse loop colostomy secondary to a stricture  . ESOPHAGOGASTRODUODENOSCOPY  05/2009   SLF: multiple antral erosions, large ulcer at ansatomosis (postsurgical changes at duodenal bulb and second portion of duodenum) BX c/x NSAIDS.  . EUS  10/04/2009   Dr. Estill Bakes dilated CBD and main pancreatic duct.  No pancreatic  . EXPLORATORY LAPAROTOMY  1992  . FLEXIBLE SIGMOIDOSCOPY  1988   Dr. Laural Golden- suggested rohn's disease but the biopsies were not collaborative.  [name] FLEXIBLE SIGMOIDOSCOPY [name] 09/08/2016   Procedure: FLEXIBLE SIGMOIDOSCOPY;  Surgeon: Wonda Horner, MD;  Location[facility] ENDOSCOPY;  Service: Gastroenterology;  Laterality: [name];  . HEMICOLOECTOMY W/ ANASTOMOSIS  1993   R- Dr.DeMason   . HERNIA REPAIR  1996   incarcerated periostial hernia with additional surgery in 1999  . IR FLUORO GUIDE PORT INSERTION RIGHT  04/03/2017  . IR US GUIDE VASC ACCESS RIGHT  04/03/2017     reports that he quit smoking about 8 years ago. His smoking use included cigarettes. He has a 24.00 pack-year smoking history. He has never used smokeless tobacco. He reports that he does not drink alcohol or use drugs.  Allergies  Allergen Reactions  . Penicillins Hives    Has patient had a PCN  reaction causing immediate rash, facial/tongue/throat swelling, SOB or lightheadedness with hypotension: Yes Has patient had a PCN reaction causing severe rash involving mucus membranes or skin necrosis: No Has patient had a PCN reaction that required hospitalization: No Has patient had a PCN reaction occurring within the last 10 years: No If all of the above answers are "NO", then may proceed with Cephalosporin use.     Family History  Problem Relation Age of Onset  . Cancer Father        prostate   . Prostate cancer Father   . Colon cancer Father 32  . Hypertension Sister   . Cancer Sister   . Depression Sister   . Breast cancer Sister   . COPD Sister   .  Aneurysm Brother        deceased, brain aneurysm     Prior to Admission medications   Medication Sig Start Date End Date Taking? Authorizing Provider  Adalimumab (HUMIRA PEN) 40 MG/0.8ML PNKT Inject 1 Syringe into the skin once a week. Patient taking differently: Inject 1 Syringe into the skin every Wednesday.  09/01/18  Yes Annitta Needs, NP  budesonide (ENTOCORT EC) 3 MG 24 hr capsule Take 6 mg by mouth daily.   Yes [provider]  calcium carbonate (OS-CAL) 600 MG TABS tablet Take 1,200 mg by mouth daily with breakfast.   Yes [provider]  ferrous sulfate 325 (65 FE) MG tablet Take 325 mg by mouth daily with breakfast.   Yes [provider]  HYDROcodone-acetaminophen (NORCO/VICODIN) 5-325 MG tablet Take 1 tablet by mouth every 6 (six) hours as needed for moderate pain.   Yes [provider]  lidocaine-prilocaine (EMLA) cream Apply 1 application topically as needed (when accessing port).   Yes [provider]  mirtazapine (REMERON) 15 MG tablet TAKE 1 TABLET BY MOUTH AT BEDTIME Patient taking differently: Take 15 mg by mouth at bedtime.  09/27/18  Yes Glouster, [name], MD  Multiple Vitamins-Minerals (CENTRUM SILVER ADULT 50+) TABS Take 1 tablet by mouth daily.   Yes [provider]  XTANDI 40 MG capsule TAKE 4 CAPSULES (160 MG TOTAL) BY MOUTH DAILY. Patient taking differently: Take 160 mg by mouth daily.  11/11/18  Yes [name], MD    Physical Exam: Vitals:   12/02/18 2101 12/02/18 2129 12/02/18 2200 12/02/18 2222  BP:    112/72  Pulse: 82 84 84 92  Resp:    16  Temp:      TempSrc:      SpO2: 100% 100% 99% 99%    Constitutional: thin man resting in bed, NAD, calm, comfortable Eyes: PERRL, lids and conjunctivae normal ENMT: Mucous membranes are moist. Posterior pharynx clear of any exudate or lesions. Neck: normal, supple. Respiratory: clear to auscultation bilaterally, no wheezing, no crackles. Normal respiratory  effort. No accessory muscle use.  Cardiovascular: Regular rate and rhythm, no murmurs / rubs / gallops. No extremity edema. Abdomen: Ostomy in place right lower quadrant, numerous surgical scars, no tenderness to palpation. No hepatosplenomegaly. Bowel sounds positive.  Musculoskeletal: no clubbing / cyanosis. No joint deformity upper and lower extremities. Good ROM, no contractures. Normal muscle tone.  Skin: no rashes, lesions, ulcers. No induration Neurologic: CN 2-12 grossly intact. Sensation intact. Strength 5/5 in all 4.  Psychiatric: Normal judgment and insight. Alert and oriented x 3. Normal mood.     Labs on Admission: I have personally reviewed following labs and imaging studies  CBC: Recent Labs  Lab 12/01/18 0920 12/02/18 1950  WBC 7.2 13.1*  NEUTROABS 3.2  --   HGB 11.0* 10.8*  HCT 33.4* 33.6*  MCV 96.5 99.4  PLT 395 250   Basic Metabolic Panel: Recent Labs  Lab 12/01/18 0920 12/02/18 1954  NA 142 134*  K 3.2* 3.8  CL 104 99  CO2 28 27  GLUCOSE 104* 109*  BUN 13 12  CREATININE 1.02 0.93  CALCIUM 8.9 8.4*   GFR: CrCl cannot be calculated (Unknown ideal weight.). Liver Function Tests: Recent Labs  Lab 12/01/18 0920 12/02/18 1954  AST 16 31  ALT 6 13  ALKPHOS 50 39  BILITOT 0.7 1.2  PROT 7.6 6.9  ALBUMIN 3.4* 3.4*   Recent Labs  Lab 12/02/18 1954  LIPASE 31   No results for input(s): AMMONIA in the last 168 hours. Coagulation Profile: No results for input(s): INR, PROTIME in the last 168 hours. Cardiac Enzymes: No results for input(s): CKTOTAL, CKMB, CKMBINDEX, TROPONINI in the last 168 hours. BNP (last 3 results) No results for input(s): PROBNP in the last 8760 hours. HbA1C: No results for input(s): HGBA1C in the last 72 hours. CBG: No results for input(s): GLUCAP in the last 168 hours. Lipid Profile: No results for input(s): CHOL, HDL, LDLCALC, TRIG, CHOLHDL, LDLDIRECT in the last 72 hours. Thyroid Function Tests: No results for  input(s): TSH, T4TOTAL, FREET4, T3FREE, THYROIDAB in the last 72 hours. Anemia Panel: No results for input(s): VITAMINB12, FOLATE, FERRITIN, TIBC, IRON, RETICCTPCT in the last 72 hours. Urine analysis:    Component Value Date/Time   COLORURINE YELLOW 12/02/2018 2322   APPEARANCEUR CLEAR 12/02/2018 2322   LABSPEC >1.046 (H) 12/02/2018 2322   PHURINE 5.0 12/02/2018 2322   GLUCOSEU NEGATIVE 12/02/2018 2322   HGBUR SMALL (A) 12/02/2018 2322   BILIRUBINUR NEGATIVE 12/02/2018 2322   KETONESUR NEGATIVE 12/02/2018 2322   PROTEINUR NEGATIVE 12/02/2018 2322   UROBILINOGEN 0.2 10/10/2014 1037   NITRITE NEGATIVE 12/02/2018 2322   LEUKOCYTESUR NEGATIVE 12/02/2018 2322    Radiological Exams on Admission: Ct Entero Abd/pelvis W Contast  Result Date: 12/02/2018 CLINICAL DATA:  Chronic abdominal pain. Recent small bowel obstructions with spontaneous resolution. Prior Spigelian hernias, prior hemicolectomy remotely. History of Crohn's disease. History of prostate cancer. EXAM: CT ABDOMEN AND PELVIS WITH CONTRAST (ENTEROGRAPHY) TECHNIQUE: Multidetector CT of the abdomen and pelvis during bolus administration of intravenous contrast. Negative oral contrast was given. CONTRAST:  158m ISOVUE-300 IOPAMIDOL (ISOVUE-300) INJECTION 61% COMPARISON:  11/11/2018 FINDINGS: Lower chest:  Unremarkable Hepatobiliary: Unremarkable Pancreas: Dilated dorsal pancreatic duct at 5 mm, chronic. A specific cause is not identified. Spleen: 10 mm enhancing nodule the upper lateral spleen, stable. 12 by 10 mm enhancing nodule in the lower spleen on image 30/2, stable. Both of the splenic lesions demonstrate mild delayed enhancement. These lesions have slowly enlarged since 2011, and may well represent hemangiomas. Adrenals/Urinary Tract: The adrenal glands and kidneys appear normal. Suspected bladder calculus 0.7 by 0.3 cm on image 72/2. This sits near the left UVJ. Stomach/Bowel: Complex appearance of left abdominal peristomal  hernia. There appear to be some nondilated loops of small bowel and also some dilated loops of small bowel within this peristomal hernia, with 1 loop measuring 3.7 cm in diameter on image 41/2. There is also a more inferiorly located dilated loop with mild mucosal enhancement measuring 4.6 cm in diameter on image 58/2, which may well be attaching to a loop of bowel which extends down into the pelvis to the anus. In turn this  loop of bowel has soft tissue density along its right margin possibly from scarring or some form of mass, measuring about 6.0 by 4.0 cm on image 72/5, previously measured about the same. Although I am not entirely certain, that same loop may be flattened and extending along the margin of the peristomal hernia towards the cutaneous surface in what may be an area of scarring or a true stoma. There is edema and stranding in the associated herniated omental adipose tissues which is increased from 11/11/2018. There is abnormal mucosal enhancement in the right abdominal loop extending to the right-sided stoma, for example on images 17 through 27 of series 7, which may represent active Crohn's disease. There is also a small loop of bowel in the right Spigelian hernia on image 33/3, without clear signs of direct obstruction related to this upper right abdominal hernia. Regarding the intra-abdominal loops of small bowel, there appear to be focal areas of accentuated mucosal enhancement, such as along a 3.7 cm collapsed loop in the right lower quadrant on image 20/7, suggesting skip lesions and active Crohn's disease. Some intra-abdominal loops are dilated up to 5.5 cm in the right upper quadrant on image 34/7. Vascular/Lymphatic: Retroperitoneal adenopathy noted with a 1.5 cm left periaortic lymph node on image 33/2 (formerly 1.4 cm) other adjacent prominent lymph nodes. Suspected venous varices adjacent to both sciatic nerves outside of the pelvis. Reproductive: The prostate gland, if still present, is  inseparable from the right perirectal mass, and indistinct. Other: Unremarkable Musculoskeletal: Right lumbar hernia containing adipose tissue, image 33/2. Peristomal and right Spigelian hernias as described above. Sclerotic lesions in the right intertrochanteric femur and bony pelvis are chronic could be a manifestation of osseous metastatic disease. Fused sacroiliac joints. IMPRESSION: 1. The patient has multiple hernias and a confusing configuration of the bowel. There appear to be multiple loops of bowel in a large left peristomal hernia. I am uncertain whether there is actually a functional stoma on the left side or not; there is a band of density that extends to the cutaneous surface resembling a stoma with a collapsed loop of bowel, but this could also simply be from scarring, correlate with any functional stoma on the left. This appears to be connected to a dilated loop of bowel which in turn extends into the abdomen, down into the pelvis, and to the anus, where there is a large but stable right perirectal mass confluent with any residual prostate tissue, not appreciably changed from prior. This seems like a counterintuitive set of connections, and it may be that the flattened density believed to represent extending to a left-sided stoma might simply be from scarring. There are 2 loops of dilated bowel in the left-sided peristomal hernia, along with some bowel wall thickening suggesting inflammation which could be related to the patient's Crohn's disease or bowel ischemia. No extraluminal gas or pneumatosis. 2. In the right abdomen there is a well-defined stoma, with loops of small bowel extending to it with accentuated mucosal enhancement favoring skip lesions of Crohn's disease. In addition, there is a small Spigelian hernia on the right containing a small knuckle of small bowel, without definite obstruction. There are dilated loops of intra-small bowel up to about 5.5 cm with bowel wall thickening, and  the cause for the inflammation could be Crohn's enteritis but is not entirely specific given the overall complex appearance. 3. Compared to the prior exam, there is some mild increase in edema in the left-sided hernia. 4. Stable retroperitoneal adenopathy, cause  uncertain. 5. Chronic dilation of the dorsal pancreatic duct. A stricture in the vicinity of the ampulla is not excluded. I do not see an obvious mass on CT. 6. Right lumbar hernia contains adipose tissue only. 7. Sclerotic lesions in the bony pelvis and right proximal femur are stable and probably from metastatic prostate cancer. 8. Enhancing splenic nodules, chronically stable probably from hemangiomas. 9. 7 by 3 mm bladder calculus. Electronically Signed   By: Van Clines M.D.   On: 12/02/2018 15:32    EKG: Not obtained  Assessment/Plan Principal Problem:   Crohn's disease of both small and large intestine with complication ([place]) Active Problems:   Prostate cancer ([facility])   ALEXAVIER TSUTSUI is a 62 y.o. male with medical history significant for Complicated Crohn's disease s/p multiple surgeries including partial colectomy with ostomy in place, recurrent partial small bowel obstructions, spigelian hernia, and metastatic prostate cancer who presents to the ED after undergoing CT enterography (findings as documented above) ordered by his GI physician (Dr. Gala Romney) and reportedly sent to the ED afterwards.  Crohn's disease with complication of recurrent partial small bowel obstruction s/p multiple surgeries including partial colectomy with ostomy in place: Very complicated Crohn's history with CT enterography findings as above. Currently treated with Humira and budesonide, follows with GI Dr. Gala Romney.  Despite his history, my examination it appears very comfortable and his nausea/pain free after IV Zofran and IV morphine.  He is passing flatus during my exam and abdomen is nontender to palpation.  Ostomy is in place and appears appropriate.   Suspect some of his acute symptoms were related to intake of a large amount of oral contrast for CT procedure.  Will observe overnight continue supportive care. -N.p.o. overnight, advance diet in a.m. as tolerated -Continue IV fluids overnight -IV antiemetics as needed -Resume home Norco as needed -Resume home budesonide   Metastatic prostate cancer: Follows with Dr. Alen Blew,  -Continue [name].   DVT prophylaxis: Lovenox Code Status: Partial-DNI Family Communication: None present at bedside on admission Disposition Plan: Likely discharge to home pending oral intake tolerance Consults called: None Admission status: Observation   Zada Finders MD [facility] Pager [phone]  If 7PM-7AM, please contact night-coverage [email or web address] Password [email or web address]  12/03/2018, 1:05 AM

## 2018-12-06 DIAGNOSIS — R948 Abnormal results of function studies of other organs and systems: Secondary | ICD-10-CM | POA: Diagnosis not present

## 2018-12-06 DIAGNOSIS — Z192 Hormone resistant malignancy status: Secondary | ICD-10-CM | POA: Diagnosis not present

## 2018-12-06 DIAGNOSIS — C7951 Secondary malignant neoplasm of bone: Secondary | ICD-10-CM | POA: Diagnosis not present

## 2018-12-06 LAB — PROSTATE-SPECIFIC AG, SERUM (LABCORP): Prostate Specific Ag, Serum: 3.7 ng/mL (ref 0.0–4.0)

## 2018-12-07 ENCOUNTER — Encounter: Payer: Self-pay | Admitting: Gastroenterology

## 2018-12-07 ENCOUNTER — Telehealth: Payer: Self-pay | Admitting: *Deleted

## 2018-12-07 ENCOUNTER — Ambulatory Visit (INDEPENDENT_AMBULATORY_CARE_PROVIDER_SITE_OTHER): Payer: Medicare Other | Admitting: Gastroenterology

## 2018-12-07 VITALS — BP 114/76 | HR 83 | Temp 98.4°F | Ht 64.5 in | Wt 140.6 lb

## 2018-12-07 DIAGNOSIS — K5090 Crohn's disease, unspecified, without complications: Secondary | ICD-10-CM | POA: Diagnosis not present

## 2018-12-07 DIAGNOSIS — K50819 Crohn's disease of both small and large intestine with unspecified complications: Secondary | ICD-10-CM | POA: Diagnosis not present

## 2018-12-07 DIAGNOSIS — Z933 Colostomy status: Secondary | ICD-10-CM | POA: Diagnosis not present

## 2018-12-07 NOTE — Progress Notes (Signed)
Primary Care Physician: Alycia Rossetti, MD  Primary Gastroenterologist:  Garfield Cornea, MD   Chief Complaint  Patient presents with  . small bowel obstruction    doing ok now    HPI: Daniel Howard is a 62 y.o. male here for follow-up.  He has a history of complicated Crohn's disease status post multiple surgeries including partial colectomy.  He has had multiple admissions in the past year (at least 8) with partial small bowel obstructions which have resolved spontaneously.  Since seen last in our office he has been hospitalized again and discharged on 11/26, and kept overnight 12/12 as well.   He saw Dr. Redmond Pulling, 1 of the surgeons down in [place] during early 10/2018 admission.  CT demonstrated significant small bowel in the spigelian hernia and also 2 areas of stricture near the ostomy.  Unfortunately again indicates significant disease related to metastatic prostate cancer, active Crohn's disease by CT as well.  Regimen includes weekly Humira, Entocort 9 mg daily.  During last admission he had CTE 11/11/18. Diffuse dilatation of SM proximally with distal decompression.  Left split Gaylene hernia containing small bowel and colon,?  May form a point of obstruction.  Inflammatory mucosal thickening intermittently in the small bowel consistent with history of Crohn's.  Multiple strictured segments suggested.    Previously scheduled CTE abdomen pelvis was also performed on 12/02/2018 as an outpatient.  Complex appearance of left abdominal parastomal hernia.  Some nondilated loops of small bowel and also some dilated loops of small bowel within the parastomal hernia, one loop measuring 3.7 cm.  More inferiorly located dilated loop with mild mucosal enhancement measuring 4.6 cm which may well be attaching to a loop of bowel which extends down into the pelvis to the anus.  Entering this loop of bowel has soft tissue density along its right margin possibly from scarring or some form of  mass, measuring 6 x 4 cm, previously measured about the same.  Increased edema and stranding in the associated herniated omental adipose tissues is November 21.  Abnormal mucosal enhancement in the right abdominal oblique extending to the right side stoma may represent active.  There is also a small loop of bowel in the right subgaleal hernia without clear signs of obstruction.  Focal areas of extenuated mucosal enhancement, such as a long 3.7 cm collapsed loop of the right lower quadrant suggesting skip lesions and active Crohn's.  After CTE 12/02/18 he started having Nausea, dry heaves. No vomiting. Upper abdominal discomfort. He was kept overnight. He is currently feeling better. Eating well. Ostomy output good. Stools are loose. No melena, brbpr. Denies abd pain. He had Humira labs since last ov but I don't have results.      Current Outpatient Medications  Medication Sig Dispense Refill  . Adalimumab (HUMIRA PEN) 40 MG/0.8ML PNKT Inject 1 Syringe into the skin once a week. (Patient taking differently: Inject 1 Syringe into the skin every Wednesday. ) 4 each 3  . budesonide (ENTOCORT EC) 3 MG 24 hr capsule Take 3 capsules (9 mg total) by mouth daily. 90 capsule 0  . calcium carbonate (OS-CAL) 600 MG TABS tablet Take 1,200 mg by mouth daily with breakfast.    . ferrous sulfate 325 (65 FE) MG tablet Take 325 mg by mouth daily with breakfast.    . HYDROcodone-acetaminophen (NORCO/VICODIN) 5-325 MG tablet Take 1 tablet by mouth every 6 (six) hours as needed for moderate pain.    [name] lidocaine-prilocaine (EMLA)  cream Apply 1 application topically as needed (when accessing port).    . mirtazapine (REMERON) 15 MG tablet TAKE 1 TABLET BY MOUTH AT BEDTIME (Patient taking differently: Take 15 mg by mouth at bedtime. ) 90 tablet 1  . Multiple Vitamins-Minerals (CENTRUM SILVER ADULT 50+) TABS Take 1 tablet by mouth daily.    [name] 40 MG capsule TAKE 4 CAPSULES (160 MG TOTAL) BY MOUTH DAILY. (Patient taking  differently: Take 160 mg by mouth daily. ) 120 capsule 0   No current facility-administered medications for this visit.    Facility-Administered Medications Ordered in Other Visits  Medication Dose Route Frequency Provider Last Rate Last Dose  . sodium chloride flush (NS) 0.9 % injection 10 mL  10 mL Intravenous PRN [name], MD   10 mL at 11/20/17 1607    Allergies as of 12/07/2018 - Review Complete 12/07/2018  Allergen Reaction Noted  . Penicillins Hives 01/25/2007    ROS:  General: Negative for anorexia, weight loss, fever, chills, fatigue, weakness. ENT: Negative for hoarseness, difficulty swallowing , nasal congestion. CV: Negative for chest pain, angina, palpitations, dyspnea on exertion, peripheral edema.  Respiratory: Negative for dyspnea at rest, dyspnea on exertion, cough, sputum, wheezing.  GI: See history of present illness. GU:  Negative for dysuria, hematuria, urinary incontinence, urinary frequency, nocturnal urination.  Endo: Negative for unusual weight change.    Physical Examination:   BP 114/76   Pulse 83   Temp 98.4 F (36.9 C) (Oral)   Ht 5' 4.5" (1.638 m)   Wt 140 lb 9.6 oz (63.8 kg)   BMI 23.76 kg/m   General: Well-nourished, well-developed in no acute distress.  Eyes: No icterus. Mouth: Oropharyngeal mucosa moist and pink , no lesions erythema or exudate. Lungs: Clear to auscultation bilaterally.  Heart: Regular rate and rhythm, no murmurs rubs or gallops.  Abdomen: Bowel sounds are normal, nontender, nondistended, multiple keloid scars. rlq ostomy unremarkable, brown liquid stool present. Tissue pink. LLQ abd wall defect/hernia, no rebound or guarding.   Extremities: No lower extremity edema. No clubbing or deformities. Neuro: Alert and oriented x 4   Skin: Warm and dry, no jaundice.   Psych: Alert and cooperative, normal mood and affect.  Labs:  Lab Results  Component Value Date   CREATININE 0.96 12/03/2018   BUN 12 12/03/2018   NA  137 12/03/2018   K 3.4 (L) 12/03/2018   CL 104 12/03/2018   CO2 26 12/03/2018   Lab Results  Component Value Date   WBC 5.8 12/03/2018   HGB 10.0 (L) 12/03/2018   HCT 30.9 (L) 12/03/2018   MCV 98.4 12/03/2018   PLT 346 12/03/2018   Lab Results  Component Value Date   ALT 13 12/02/2018   AST 31 12/02/2018   ALKPHOS 39 12/02/2018   BILITOT 1.2 12/02/2018     Imaging Studies: Dg Abd 1 View  Result Date: 11/12/2018 CLINICAL DATA:  Enteric tube placement, small-bowel obstruction EXAM: ABDOMEN - 1 VIEW COMPARISON:  CT abdomen/pelvis and abdominal radiograph from earlier today FINDINGS: Enteric tube terminates in proximal stomach. Moderately dilated small bowel loops throughout the abdomen, worsened from earlier today. No evidence of pneumatosis or pneumoperitoneum. Hernia repair mesh is noted in the right lower quadrant. No radiopaque nephrolithiasis. Excreted contrast is seen in the bladder. IMPRESSION: Enteric tube terminates in the proximal stomach. Moderately dilated small bowel loops throughout the abdomen, worsened compared to radiograph from earlier today, compatible with distal small bowel obstruction. Electronically Signed  By: Ilona Sorrel M.D.   On: 11/12/2018 11:06   Ct Abdomen Pelvis W Contrast  Result Date: 11/10/2018 CLINICAL DATA:  Abdominal pain with decrease flatus. History of bowel obstruction and ostomy. History of Crohn's disease and prostate cancer. EXAM: CT ABDOMEN AND PELVIS WITH CONTRAST TECHNIQUE: Multidetector CT imaging of the abdomen and pelvis was performed using the standard protocol following bolus administration of intravenous contrast. CONTRAST:  152m OMNIPAQUE IOHEXOL 300 MG/ML  SOLN COMPARISON:  Radiographs 10/01/2018.  CT 09/30/2018 and 11/01/2018. FINDINGS: Lower chest: Mild dependent atelectasis at both lung bases. No significant pleural or pericardial effusion. Hepatobiliary: The liver is normal in density without focal abnormality. No evidence of  gallstones, gallbladder wall thickening or biliary dilatation. Pancreas: Stable chronic pancreatic ductal dilatation. No evidence of pancreatic mass or surrounding inflammation. Spleen: There are stable hypervascular lesions anteriorly in the spleen, measuring 10 mm on image 13 and 11 mm on image 22/3. These are probably incidental hemangiomas. The spleen is normal in size. Adrenals/Urinary Tract: Both adrenal glands appear normal. Stable probable small cyst anteriorly in the mid left kidney. The kidneys otherwise appear normal without evidence of urinary tract calculus or hydronephrosis. Small bladder calculi are again noted. No significant bladder wall thickening. Stomach/Bowel: The stomach is decompressed. Again demonstrated are multiple moderately dilated loops of small bowel within the mid abdomen. The patient is status post partial small bowel and proximal colon resection. The distal small bowel appears decompressed proximal to a right lower quadrant ileostomy and show slightly less hyperenhancement compared with the most recent study. A large left spigelian hernia containing multiple loops of small bowel is again noted, unchanged. The splenic flexure of the colon and proximal descending colon extend into this hernia. The distal colon is decompressed. Vascular/Lymphatic: Multiple enlarged retroperitoneal and mesenteric lymph nodes are again noted, similar to recent prior studies. There is a 14 mm left periaortic node on image 31/3. There is a right mesenteric node measuring 11 mm on image 44/3. No acute vascular findings. There is a retroaortic left renal vein. Reproductive: Right perirectal mass inseparable from the prostate gland is similar to the recent study, measuring approximately 6.6 x 2.7 cm on image 71/3. Other: No ascites, free air or peritoneal nodularity identified. Stable small right-sided lumbar hernia containing only fat. Musculoskeletal: Multiple sclerotic lesions are again noted within the  pelvis consistent with osseous metastatic disease. There is a most prominent within the right iliac bone (2.2 cm on image 55/3) and in the right ischium. There is also a prominent lesion in the proximal right femur which appears stable. No lytic lesion or pathologic fracture identified. IMPRESSION: 1. Little change is seen from the most recent study of 9 days ago. There is chronic moderate small bowel dilatation status post ileostomy, distal small bowel and proximal colon resection. Findings remain consistent with a low-grade partial bowel obstruction. Small bowel mucosal hyperenhancement is slightly improved from the recent study. 2. 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 3. No evidence of intra-abdominal abscess. 4. Stable lymphadenopathy, likely reactive. 5. Stable right pelvic mass, likely treated prostate cancer. Multifocal osseous metastatic disease. Electronically Signed   By: WRichardean SaleM.D.   On: 11/10/2018 14:17   Ct Entero Abd/pelvis W Contast  Result Date: 12/02/2018 CLINICAL DATA:  Chronic abdominal pain. Recent small bowel obstructions with spontaneous resolution. Prior Spigelian hernias, prior hemicolectomy remotely. History of Crohn's disease. History of prostate cancer. EXAM: CT ABDOMEN AND PELVIS WITH CONTRAST (ENTEROGRAPHY) TECHNIQUE: Multidetector CT  of the abdomen and pelvis during bolus administration of intravenous contrast. Negative oral contrast was given. CONTRAST:  133m ISOVUE-300 IOPAMIDOL (ISOVUE-300) INJECTION 61% COMPARISON:  11/11/2018 FINDINGS: Lower chest:  Unremarkable Hepatobiliary: Unremarkable Pancreas: Dilated dorsal pancreatic duct at 5 mm, chronic. A specific cause is not identified. Spleen: 10 mm enhancing nodule the upper lateral spleen, stable. 12 by 10 mm enhancing nodule in the lower spleen on image 30/2, stable. Both of the splenic lesions demonstrate mild delayed enhancement. These lesions have slowly enlarged since 2011, and  may well represent hemangiomas. Adrenals/Urinary Tract: The adrenal glands and kidneys appear normal. Suspected bladder calculus 0.7 by 0.3 cm on image 72/2. This sits near the left UVJ. Stomach/Bowel: Complex appearance of left abdominal peristomal hernia. There appear to be some nondilated loops of small bowel and also some dilated loops of small bowel within this peristomal hernia, with 1 loop measuring 3.7 cm in diameter on image 41/2. There is also a more inferiorly located dilated loop with mild mucosal enhancement measuring 4.6 cm in diameter on image 58/2, which may well be attaching to a loop of bowel which extends down into the pelvis to the anus. In turn this loop of bowel has soft tissue density along its right margin possibly from scarring or some form of mass, measuring about 6.0 by 4.0 cm on image 72/5, previously measured about the same. Although I am not entirely certain, that same loop may be flattened and extending along the margin of the peristomal hernia towards the cutaneous surface in what may be an area of scarring or a true stoma. There is edema and stranding in the associated herniated omental adipose tissues which is increased from 11/11/2018. There is abnormal mucosal enhancement in the right abdominal loop extending to the right-sided stoma, for example on images 17 through 27 of series 7, which may represent active Crohn's disease. There is also a small loop of bowel in the right Spigelian hernia on image 33/3, without clear signs of direct obstruction related to this upper right abdominal hernia. Regarding the intra-abdominal loops of small bowel, there appear to be focal areas of accentuated mucosal enhancement, such as along a 3.7 cm collapsed loop in the right lower quadrant on image 20/7, suggesting skip lesions and active Crohn's disease. Some intra-abdominal loops are dilated up to 5.5 cm in the right upper quadrant on image 34/7. Vascular/Lymphatic: Retroperitoneal adenopathy  noted with a 1.5 cm left periaortic lymph node on image 33/2 (formerly 1.4 cm) other adjacent prominent lymph nodes. Suspected venous varices adjacent to both sciatic nerves outside of the pelvis. Reproductive: The prostate gland, if still present, is inseparable from the right perirectal mass, and indistinct. Other: Unremarkable Musculoskeletal: Right lumbar hernia containing adipose tissue, image 33/2. Peristomal and right Spigelian hernias as described above. Sclerotic lesions in the right intertrochanteric femur and bony pelvis are chronic could be a manifestation of osseous metastatic disease. Fused sacroiliac joints. IMPRESSION: 1. The patient has multiple hernias and a confusing configuration of the bowel. There appear to be multiple loops of bowel in a large left peristomal hernia. I am uncertain whether there is actually a functional stoma on the left side or not; there is a band of density that extends to the cutaneous surface resembling a stoma with a collapsed loop of bowel, but this could also simply be from scarring, correlate with any functional stoma on the left. This appears to be connected to a dilated loop of bowel which in turn extends into the  abdomen, down into the pelvis, and to the anus, where there is a large but stable right perirectal mass confluent with any residual prostate tissue, not appreciably changed from prior. This seems like a counterintuitive set of connections, and it may be that the flattened density believed to represent extending to a left-sided stoma might simply be from scarring. There are 2 loops of dilated bowel in the left-sided peristomal hernia, along with some bowel wall thickening suggesting inflammation which could be related to the patient's Crohn's disease or bowel ischemia. No extraluminal gas or pneumatosis. 2. In the right abdomen there is a well-defined stoma, with loops of small bowel extending to it with accentuated mucosal enhancement favoring skip lesions  of Crohn's disease. In addition, there is a small Spigelian hernia on the right containing a small knuckle of small bowel, without definite obstruction. There are dilated loops of intra-small bowel up to about 5.5 cm with bowel wall thickening, and the cause for the inflammation could be Crohn's enteritis but is not entirely specific given the overall complex appearance. 3. Compared to the prior exam, there is some mild increase in edema in the left-sided hernia. 4. Stable retroperitoneal adenopathy, cause uncertain. 5. Chronic dilation of the dorsal pancreatic duct. A stricture in the vicinity of the ampulla is not excluded. I do not see an obvious mass on CT. 6. Right lumbar hernia contains adipose tissue only. 7. Sclerotic lesions in the bony pelvis and right proximal femur are stable and probably from metastatic prostate cancer. 8. Enhancing splenic nodules, chronically stable probably from hemangiomas. 9. 7 by 3 mm bladder calculus. Electronically Signed   By: Van Clines M.D.   On: 12/02/2018 15:32   Ct Entero Abd/pelvis W Contast  Result Date: 11/12/2018 CLINICAL DATA:  Exacerbation of Crohn's disease. EXAM: CT ABDOMEN AND PELVIS WITH CONTRAST (ENTEROGRAPHY) TECHNIQUE: Multidetector CT of the abdomen and pelvis during bolus administration of intravenous contrast. Negative oral contrast was given. CONTRAST:  164m OMNIPAQUE IOHEXOL 300 MG/ML  SOLN COMPARISON:  11/10/2018 FINDINGS: Lower chest: Atelectasis in both lung bases. The esophagus is dilated and fluid-filled. This may be due to reflux or dysmotility. No esophageal wall thickening. Hepatobiliary: No masses or other significant abnormality. Pancreas: No mass, inflammatory changes, or other significant abnormality. Mildly dilated pancreatic duct, unchanged since prior study. Spleen: Spleen size is normal. Hyperenhancing focus in the inferior spleen measuring 10 mm diameter. Probable hemangioma. Adrenals/Urinary Tract: No adrenal gland nodules.  Kidneys are symmetrical. Nephrograms are homogeneous. No hydronephrosis or hydroureter. Small stones layering in the bladder. No bladder wall thickening. No change. Stomach/Bowel: Stomach and small bowel are distended with fluid. Distal small bowel are decompressed. Some right lower quadrant small bowel loops demonstrate wall thickening and enhancement compatible with history of Crohn disease. Previous subtotal colectomy with right lower quadrant ileostomy. Left lower quadrant spigelian abdominal wall hernia containing multiple small bowel loops and splenic flexure of colon. This may represent a point of obstruction. Rectosigmoid colon is decompressed. Vascular/Lymphatic: Normal caliber abdominal aorta. Moderate lymphadenopathy demonstrated in the upper abdomen, retroperitoneum, mesentery, and right lower quadrant. Lymph nodes are similar in appearance to previous study and are likely reactive. Reproductive: Asymmetric prostate or right perirectal mass lesion with calcifications, extending to the base of the bladder and the right pelvic sidewall, measuring about 4 x 5.6 cm diameter. Similar appearance to previous study. This may represent a prostate mass. Other: No free air or free fluid in the abdomen. Right lumbar triangle hernia containing fat. Musculoskeletal: Degenerative  changes in the spine. No destructive bone lesions. There are well-defined sclerotic lesions in the pelvis, right acetabulum, and right proximal femur possibly representing metastasis such as would be expected with prostate cancer. Correlation with PSA is recommended. IMPRESSION: 1. Esophagus and stomach are more distended today. Otherwise, no significant change since yesterday's study. 2. Diffuse dilatation of small bowel proximally with distal decompression. Left spigelian hernia containing small bowel and colon. This may form a point of obstruction. Inflammatory mucosal thickening intermittently in the small bowel consistent with history of  Crohn disease. Multiple strictured segments are suggested. 3. Abdominal lymphadenopathy likely reactive related to history of Crohn disease. 4. No abscess or fistula demonstrated. 5. Asymmetric right perirectal/prostate mass. 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 6. Right lower quadrant ileostomy. 7. Right lumbar triangle hernia containing fat. Electronically Signed   By: Lucienne Capers M.D.   On: 11/12/2018 00:37   Dg Abd 2 Views  Result Date: 11/14/2018 CLINICAL DATA:  Follow-up small bowel obstruction. EXAM: ABDOMEN - 2 VIEW COMPARISON:  11/13/2018 and prior exams FINDINGS: An NG tube with tip overlying the mid stomach is again noted. Dilated fluid and gas-filled loops of small bowel are again noted and not significantly changed. There is no evidence of pneumoperitoneum. IMPRESSION: No significant change in distended small bowel loops from 11/13/2018. Electronically Signed   By: Margarette Canada M.D.   On: 11/14/2018 09:17   Dg Abd Portable 1v  Result Date: 11/13/2018 CLINICAL DATA:  Followup small bowel obstruction. EXAM: PORTABLE ABDOMEN - 1 VIEW COMPARISON:  11/12/2018 radiograph, 11/11/2018 CT FINDINGS: NG tube with tip overlying the proximal-mid stomach again noted. Distended small bowel loops have slightly decreased in caliber. No other significant changes identified. IMPRESSION: Decreased distension of small bowel loops without other significant change. Electronically Signed   By: Margarette Canada M.D.   On: 11/13/2018 08:09   Dg Abd Portable 1v  Result Date: 11/11/2018 CLINICAL DATA:  Follow-up small bowel obstruction. EXAM: PORTABLE ABDOMEN - 1 VIEW COMPARISON:  11/10/2018 FINDINGS: Single view shows mild persistent small bowel dilatation consistent with persistent but probably improved partial small bowel obstruction. No worsening based on this 1 projection. IMPRESSION: Improved radiographic appearance. Mildly prominent small bowel loops, apparently improved since yesterday. Findings  consistent with improved partial small bowel obstruction. Electronically Signed   By: Nelson Chimes M.D.   On: 11/11/2018 09:21   Dg Abd Portable 1v  Result Date: 11/10/2018 CLINICAL DATA:  Abdominal pain.  History of small-bowel obstruction. EXAM: PORTABLE ABDOMEN - 1 VIEW COMPARISON:  CT abdomen and pelvis November 10, 2018 at 1344 hours FINDINGS: A few loops of gas distended small bowel in LEFT lower quadrant. Contrast in the urinary collecting system, no hydronephrosis. RIGHT lower abdominal herniorrhaphy. No intra-abdominal mass effect or pathologic calcifications. Soft tissue planes and included osseous structures are non suspicious. IMPRESSION: Non-specific bowel gas pattern. Contrast in the urinary collecting system. Electronically Signed   By: Elon Alas M.D.   On: 11/10/2018 22:24   Impression/plan:  Very pleasant 62 year old gentleman with history of complicated Crohn's disease with recurrent small bowel obstructions with repeated hospitalizations over the past year, CT imaging suggesting stricturing and at least 2 places near the ostomy.  Repeated CTE x2 since last office visit here with complicated anatomy with multiple abdominal wall hernias, active Crohn's.  Matters are complicated by metastatic prostate cancer.  Humira labs were done, results not in epic.  We will have to find these results.  Back in April he had no  evidence of antibodies but his drug level was low.  He was switched weekly dosing.  I will address current CTE findings with Dr. Gala Romney (originally ordered by him) given complicated anatomy.   Further recommendations to follow.

## 2018-12-07 NOTE — Telephone Encounter (Signed)
Spoke with patient, gave results of last PSA

## 2018-12-07 NOTE — Telephone Encounter (Signed)
-----   Message from [name], MD sent at 12/07/2018  8:37 AM EST ----- Please let him know his PSA is down.

## 2018-12-07 NOTE — Patient Instructions (Signed)
I will try and find copy of your Humira labs and discuss current CT findings with Dr. Gala Romney. We will be in touch by the end of the week.

## 2018-12-07 NOTE — Progress Notes (Signed)
cc'ed to pcp °

## 2018-12-08 ENCOUNTER — Other Ambulatory Visit: Payer: Self-pay

## 2018-12-08 ENCOUNTER — Encounter: Payer: Self-pay | Admitting: Family Medicine

## 2018-12-08 ENCOUNTER — Ambulatory Visit (INDEPENDENT_AMBULATORY_CARE_PROVIDER_SITE_OTHER): Payer: Medicare Other | Admitting: Family Medicine

## 2018-12-08 VITALS — BP 128/64 | HR 80 | Temp 98.7°F | Resp 16 | Ht 64.5 in | Wt 143.0 lb

## 2018-12-08 DIAGNOSIS — C7951 Secondary malignant neoplasm of bone: Secondary | ICD-10-CM

## 2018-12-08 DIAGNOSIS — E43 Unspecified severe protein-calorie malnutrition: Secondary | ICD-10-CM

## 2018-12-08 DIAGNOSIS — K50819 Crohn's disease of both small and large intestine with unspecified complications: Secondary | ICD-10-CM | POA: Diagnosis not present

## 2018-12-08 DIAGNOSIS — C61 Malignant neoplasm of prostate: Secondary | ICD-10-CM | POA: Diagnosis not present

## 2018-12-08 DIAGNOSIS — Z23 Encounter for immunization: Secondary | ICD-10-CM | POA: Diagnosis not present

## 2018-12-08 MED ORDER — HYDROCODONE-ACETAMINOPHEN 10-325 MG PO TABS: 1.0000 | ORAL_TABLET | Freq: Three times a day (TID) | ORAL | 0 refills | Status: DC | PRN

## 2018-12-08 NOTE — Patient Instructions (Signed)
F/U 6 months  Flu shot given

## 2018-12-08 NOTE — Progress Notes (Signed)
Subjective:    Patient ID: Daniel [facility], male    DOB: 10/23/1956, 62 y.o.   MRN: [id number]  Patient presents for Hospital F/U (Bowel Obstruction- has been seen by GI)  Patient here for hospital follow-up.  He has been admitted twice over the past couple month secondary to complication with his Crohn's disease.  He has had recurrent bowel obstructions.  He has multiple hernias around his stoma and a very large one on the left side where he has had previous surgeries they think may be complicating his issues.  He also has underlying metastatic prostate cancer.  He is being managed by gastroenterology locally but they are contemplating sending him back to [facility] to see if any surgical intervention can help with his recurrent bowel obstructions.  I did review his GI note and his oncology note from the past couple of weeks.  He does state that his pain medication is not working as well.  He was told by his oncologist that it likely needs to be increased because of the new chemotherapy drug Xtandi they have him on. I did review his recent PSA that was normal his testosterone was also less than 10 he is on androgen deprivation therapy by his urologist. Appetite has improved.  His wife that he had lost about 10 pounds but is now back up to the 140s.  He is taking the mirtazapine most days.   Review Of Systems:  GEN- denies fatigue, fever, weight loss,weakness, recent illness HEENT- denies eye drainage, change in vision, nasal discharge, CVS- denies chest pain, palpitations RESP- denies SOB, cough, wheeze ABD- denies N/V, change in stools, +abd pain GU- denies dysuria, hematuria, dribbling, incontinence MSK- denies joint pain, muscle aches, injury Neuro- denies headache, dizziness, syncope, seizure activity       Objective:    BP 128/64   Pulse 80   Temp 98.7 F (37.1 C) (Oral)   Resp 16   Ht 5' 4.5" (1.638 m)   Wt 143 lb (64.9 kg)   SpO2 97%   BMI 24.17 kg/m  GEN- NAD, alert  and oriented x3 HEENT- PERRL, EOMI, non injected sclera, pink conjunctiva, MMM, oropharynx clear CVS- RRR, no murmur RESP-CTAB ABD-NABS,soft, NT, ostomy in tact- loose stool in bag, stoma pink, hernia to side of midline incision EXT- No edema Pulses- Radial 2+        Assessment & Plan:    Reviewed ER/hospital images and labs   Audit C/ Depression screen NEGATIVE Problem List Items Addressed This Visit      Unprioritized   Crohn's disease of both small and large intestine with complication (HCC) - Primary (Chronic)    Complex patient with regards to his Crohn's disease his previous surgeries now the recurrent bowel obstructions.  Will defer to gastroenterology.  I am writing his pain medication we will increase the hydrocodone to 10/325 mg and see how he does.  He also has underlying metastatic prostate cancer which is not curable but overall his levels have looked good from that standpoint.  He will follow-up with his specialist.  His appetite has improved he has gained back the weight that he is lost.  No change to the mirtazapine.      Prostate cancer metastatic to bone (HCC)   Protein-calorie malnutrition, severe ([place])    Other Visit Diagnoses    Need for immunization against influenza       Relevant Orders   Flu Vaccine QUAD 36+ mos IM (Completed)  Note: This dictation was prepared with Dragon dictation along with smaller phrase technology. Any transcriptional errors that result from this process are unintentional.

## 2018-12-08 NOTE — Assessment & Plan Note (Signed)
Complex patient with regards to his Crohn's disease his previous surgeries now the recurrent bowel obstructions.  Will defer to gastroenterology.  I am writing his pain medication we will increase the hydrocodone to 10/325 mg and see how he does.  He also has underlying metastatic prostate cancer which is not curable but overall his levels have looked good from that standpoint.  He will follow-up with his specialist.  His appetite has improved he has gained back the weight that he is lost.  No change to the mirtazapine.

## 2018-12-13 ENCOUNTER — Other Ambulatory Visit: Payer: Self-pay | Admitting: Oncology

## 2018-12-13 ENCOUNTER — Telehealth: Payer: Self-pay | Admitting: General Practice

## 2018-12-13 DIAGNOSIS — C61 Malignant neoplasm of prostate: Secondary | ICD-10-CM

## 2018-12-13 DIAGNOSIS — K56609 Unspecified intestinal obstruction, unspecified as to partial versus complete obstruction: Secondary | ICD-10-CM

## 2018-12-13 DIAGNOSIS — K50819 Crohn's disease of both small and large intestine with unspecified complications: Secondary | ICD-10-CM

## 2018-12-13 NOTE — Telephone Encounter (Signed)
Patient's wife called wanting to follow-up with Magda Paganini in regards to recommendations on a plan of care for her husband.  She stated Magda Paganini said she would get in touch with her by Friday.  She can be reached at [phone].

## 2018-12-13 NOTE — Telephone Encounter (Signed)
COPIED FROM STAFF MESSAGE REGARDING PATIENT.   From: Daneil Dolin, MD  Sent: 12/03/2018 12:56 PM EST  To: Claudina Lick, LPN, [place]   Patient admitted once again with partial SBO in [place]. Dr. Bonnielee Haff one the hospitalist called me to discuss. Going home today. He needs an urgent follow-up with [place]. He has had numerous SBO's in the recent last several months. Recent CT with complex findings difficult to interpret.  He needs to be seen by extender either Monday Tuesday or Wednesday, week of December 16. Need to review Humira drug/antibody levels. Should have already been drawn.   Likely needs to be referred back over to [facility] see one of the GI surgeons over there for recurrent SBO's. Any surgical intervention would be most complex and difficult.   Patient was discharged on Entocort 9 mg daily per my recommendations.       Spoke with wife. Awaiting further input from Dr. Gala Romney as far as to which surgeon at [facility] he is recommending. THEN WE WILL SEND REFERAL.   [facility] humira labs located. He has undetectable antibodies to Humira and his drug level is 8.9 on weekly dosing.

## 2018-12-16 MED FILL — XTANDI 40 MG CAPSULE: 40 | 30 days supply | Qty: 120 | Fill #0

## 2018-12-23 NOTE — Telephone Encounter (Signed)
Spoke with spouse. She is aware of LSL recommendations. Pt needs a 4-6 weeks ov around 10:00 or later.

## 2018-12-23 NOTE — Telephone Encounter (Signed)
LMTCB for pt 

## 2018-12-23 NOTE — Telephone Encounter (Signed)
Called [facility] and patient is scheduled with Dr. Morton Stall on 01/25/2019 at 9:30am in the [place] office. They will mail patient a packet with appt information and location.

## 2018-12-23 NOTE — Telephone Encounter (Signed)
Please arrange for patient to see Dr. Lenise Arena at [facility] for recurrent small bowel obstructions in the past several months, complex surgical history in the setting of Crohn's.   Make sure we send copies of all recent CT imaging 12/02/2018 CT entero of abdomen/pelvis 11/11/2018 CT entero of abdomen/pelvis November 10, 2018 CT abdomen pelvis with contrast November 01, 2018 CT abdomen pelvis with contrast September 30, 2018 CT abdomen pelvis with contrast July 03, 2018 CT abdomen pelvis with contrast   Patient needs to continue Humira weekly as before.  Continue entocort 59m daily for now.  Return ov with RMR only in 4-6 weeks.

## 2018-12-23 NOTE — Addendum Note (Signed)
Addended by: Inge Rise on: 12/23/2018 03:08 PM   Modules accepted: Orders

## 2018-12-23 NOTE — Telephone Encounter (Signed)
Lm on home and cell number. Waiting on a return call.

## 2018-12-23 NOTE — Telephone Encounter (Signed)
Spouse called back and is aware of appt details

## 2018-12-27 ENCOUNTER — Encounter: Payer: Self-pay | Admitting: Internal Medicine

## 2018-12-27 NOTE — Telephone Encounter (Signed)
SCHEDULED AND LETTER SENT  °

## 2018-12-29 ENCOUNTER — Other Ambulatory Visit: Payer: Self-pay

## 2018-12-29 ENCOUNTER — Encounter (HOSPITAL_COMMUNITY): Payer: Self-pay | Admitting: Emergency Medicine

## 2018-12-29 ENCOUNTER — Inpatient Hospital Stay (HOSPITAL_COMMUNITY)
Admission: EM | Admit: 2018-12-29 | Discharge: 2019-01-02 | DRG: 389 | Disposition: A | Discharge status: Home / Self Care | Payer: Medicare Other | Attending: Internal Medicine | Admitting: Internal Medicine

## 2018-12-29 ENCOUNTER — Emergency Department (HOSPITAL_COMMUNITY): Payer: Medicare Other

## 2018-12-29 DIAGNOSIS — Z7952 Long term (current) use of systemic steroids: Secondary | ICD-10-CM

## 2018-12-29 DIAGNOSIS — K56609 Unspecified intestinal obstruction, unspecified as to partial versus complete obstruction: Secondary | ICD-10-CM

## 2018-12-29 DIAGNOSIS — Z933 Colostomy status: Secondary | ICD-10-CM

## 2018-12-29 DIAGNOSIS — C61 Malignant neoplasm of prostate: Secondary | ICD-10-CM | POA: Diagnosis present

## 2018-12-29 DIAGNOSIS — Z880 Allergy status to penicillin: Secondary | ICD-10-CM | POA: Diagnosis not present

## 2018-12-29 DIAGNOSIS — K565 Intestinal adhesions [bands], unspecified as to partial versus complete obstruction: Secondary | ICD-10-CM | POA: Diagnosis present

## 2018-12-29 DIAGNOSIS — K50819 Crohn's disease of both small and large intestine with unspecified complications: Secondary | ICD-10-CM | POA: Diagnosis not present

## 2018-12-29 DIAGNOSIS — Z803 Family history of malignant neoplasm of breast: Secondary | ICD-10-CM

## 2018-12-29 DIAGNOSIS — C799 Secondary malignant neoplasm of unspecified site: Secondary | ICD-10-CM | POA: Diagnosis not present

## 2018-12-29 DIAGNOSIS — Z800 Family history of malignant neoplasm of digestive organs: Secondary | ICD-10-CM | POA: Diagnosis not present

## 2018-12-29 DIAGNOSIS — Z8042 Family history of malignant neoplasm of prostate: Secondary | ICD-10-CM

## 2018-12-29 DIAGNOSIS — Z87891 Personal history of nicotine dependence: Secondary | ICD-10-CM

## 2018-12-29 DIAGNOSIS — K56600 Partial intestinal obstruction, unspecified as to cause: Secondary | ICD-10-CM | POA: Diagnosis not present

## 2018-12-29 DIAGNOSIS — K5651 Intestinal adhesions [bands], with partial obstruction: Principal | ICD-10-CM | POA: Diagnosis present

## 2018-12-29 DIAGNOSIS — Z8249 Family history of ischemic heart disease and other diseases of the circulatory system: Secondary | ICD-10-CM

## 2018-12-29 DIAGNOSIS — Z79899 Other long term (current) drug therapy: Secondary | ICD-10-CM | POA: Diagnosis not present

## 2018-12-29 DIAGNOSIS — Z825 Family history of asthma and other chronic lower respiratory diseases: Secondary | ICD-10-CM | POA: Diagnosis not present

## 2018-12-29 DIAGNOSIS — Z8711 Personal history of peptic ulcer disease: Secondary | ICD-10-CM | POA: Diagnosis not present

## 2018-12-29 DIAGNOSIS — Z8546 Personal history of malignant neoplasm of prostate: Secondary | ICD-10-CM | POA: Diagnosis not present

## 2018-12-29 LAB — CBC
HCT: 41.5 % (ref 39.0–52.0)
Hemoglobin: 13.4 g/dL (ref 13.0–17.0)
MCH: 31.5 pg (ref 26.0–34.0)
MCHC: 32.3 g/dL (ref 30.0–36.0)
MCV: 97.6 fL (ref 80.0–100.0)
Platelets: 453 10*3/uL — ABNORMAL HIGH (ref 150–400)
RBC: 4.25 MIL/uL (ref 4.22–5.81)
RDW: 12.3 % (ref 11.5–15.5)
WBC: 7 10*3/uL (ref 4.0–10.5)
nRBC: 0 % (ref 0.0–0.2)

## 2018-12-29 LAB — COMPREHENSIVE METABOLIC PANEL
ALT: 18 U/L (ref 0–44)
AST: 26 U/L (ref 15–41)
Albumin: 3.4 g/dL — ABNORMAL LOW (ref 3.5–5.0)
Alkaline Phosphatase: 55 U/L (ref 38–126)
Anion gap: 9 (ref 5–15)
BUN: 9 mg/dL (ref 6–20)
CHLORIDE: 100 mmol/L (ref 98–111)
CO2: 28 mmol/L (ref 22–32)
Calcium: 9.3 mg/dL (ref 8.9–10.3)
Creatinine, Ser: 1.09 mg/dL (ref 0.61–1.24)
GFR calc Af Amer: >60 mL/min (ref >60)
GFR calc non Af Amer: >60 mL/min (ref >60)
Glucose, Bld: 119 mg/dL — ABNORMAL HIGH (ref 70–99)
Potassium: 4 mmol/L (ref 3.5–5.1)
Sodium: 137 mmol/L (ref 135–145)
Total Bilirubin: 0.8 mg/dL (ref 0.3–1.2)
Total Protein: 8 g/dL (ref 6.5–8.1)

## 2018-12-29 LAB — URINALYSIS, ROUTINE W REFLEX MICROSCOPIC
Bilirubin Urine: NEGATIVE
Glucose, UA: NEGATIVE mg/dL
HGB URINE DIPSTICK: NEGATIVE
Ketones, ur: NEGATIVE mg/dL
Leukocytes, UA: NEGATIVE
Nitrite: NEGATIVE
PH: 6 (ref 5.0–8.0)
Protein, ur: NEGATIVE mg/dL
Specific Gravity, Urine: >1.046 — ABNORMAL HIGH (ref 1.005–1.030)

## 2018-12-29 LAB — LIPASE, BLOOD: LIPASE: 22 U/L (ref 11–51)

## 2018-12-29 MED ORDER — HYDROMORPHONE HCL 1 MG/ML IJ SOLN
1.0000 mg | Freq: Once | INTRAMUSCULAR | Status: AC
  Administered 2018-12-29: 1 mg via INTRAVENOUS
  Filled 2018-12-29: qty 1

## 2018-12-29 MED ORDER — ONDANSETRON HCL 4 MG/2ML IJ SOLN
4.0000 mg | Freq: Four times a day (QID) | INTRAMUSCULAR | Status: DC | PRN
  Administered 2018-12-29 – 2018-12-30 (×2): 4 mg via INTRAVENOUS
  Filled 2018-12-29 (×2): qty 2

## 2018-12-29 MED ORDER — MIRTAZAPINE 15 MG PO TABS
15.0000 mg | ORAL_TABLET | Freq: Every day | ORAL | Status: DC
  Administered 2018-12-30 – 2019-01-01 (×4): 15 mg via ORAL
  Filled 2018-12-29 (×4): qty 1

## 2018-12-29 MED ORDER — HYDROMORPHONE HCL 1 MG/ML IJ SOLN: 1.0000 mg | Freq: Once | INTRAMUSCULAR | Status: DC

## 2018-12-29 MED ORDER — FERROUS SULFATE 325 (65 FE) MG PO TABS
325.0000 mg | ORAL_TABLET | Freq: Every day | ORAL | Status: DC
  Administered 2018-12-30 – 2019-01-02 (×3): 325 mg via ORAL
  Filled 2018-12-29 (×3): qty 1

## 2018-12-29 MED ORDER — ENOXAPARIN SODIUM 30 MG/0.3ML ~~LOC~~ SOLN
30.0000 mg | SUBCUTANEOUS | Status: DC
  Administered 2018-12-30: 30 mg via SUBCUTANEOUS
  Filled 2018-12-29: qty 0.3

## 2018-12-29 MED ORDER — ACETAMINOPHEN 650 MG RE SUPP: 650.0000 mg | Freq: Four times a day (QID) | RECTAL | Status: DC | PRN

## 2018-12-29 MED ORDER — ACETAMINOPHEN 325 MG PO TABS
650.0000 mg | ORAL_TABLET | Freq: Four times a day (QID) | ORAL | Status: DC | PRN
  Administered 2018-12-30 – 2019-01-02 (×2): 650 mg via ORAL
  Filled 2018-12-29 (×2): qty 2

## 2018-12-29 MED ORDER — ENZALUTAMIDE 40 MG PO CAPS
160.0000 mg | ORAL_CAPSULE | Freq: Every day | ORAL | Status: DC
  Administered 2018-12-30 – 2019-01-02 (×4): 160 mg via ORAL
  Filled 2018-12-29 (×6): qty 4

## 2018-12-29 MED ORDER — IOHEXOL 300 MG/ML  SOLN
100.0000 mL | Freq: Once | INTRAMUSCULAR | Status: AC | PRN
  Administered 2018-12-29: 100 mL via INTRAVENOUS

## 2018-12-29 MED ORDER — SODIUM CHLORIDE 0.9 % IV BOLUS
1000.0000 mL | Freq: Once | INTRAVENOUS | Status: AC
  Administered 2018-12-29: 1000 mL via INTRAVENOUS

## 2018-12-29 MED ORDER — ONDANSETRON HCL 4 MG PO TABS: 4.0000 mg | ORAL_TABLET | Freq: Four times a day (QID) | ORAL | Status: DC | PRN

## 2018-12-29 MED ORDER — MORPHINE SULFATE (PF) 2 MG/ML IV SOLN
2.0000 mg | INTRAVENOUS | Status: DC | PRN
  Administered 2018-12-29 – 2018-12-30 (×4): 2 mg via INTRAVENOUS
  Filled 2018-12-29 (×4): qty 1

## 2018-12-29 MED ORDER — ONDANSETRON HCL 4 MG/2ML IJ SOLN
4.0000 mg | Freq: Once | INTRAMUSCULAR | Status: AC
  Administered 2018-12-29: 4 mg via INTRAVENOUS
  Filled 2018-12-29: qty 2

## 2018-12-29 MED ORDER — HYDROCODONE-ACETAMINOPHEN 10-325 MG PO TABS
1.0000 | ORAL_TABLET | Freq: Three times a day (TID) | ORAL | Status: DC | PRN
  Administered 2019-01-02: 1 via ORAL
  Filled 2018-12-29 (×3): qty 1

## 2018-12-29 MED ORDER — MORPHINE SULFATE (PF) 4 MG/ML IV SOLN
4.0000 mg | Freq: Once | INTRAVENOUS | Status: AC
  Administered 2018-12-29: 4 mg via INTRAVENOUS
  Filled 2018-12-29: qty 1

## 2018-12-29 NOTE — H&P (Signed)
History and Physical    [name] DOB: 1956/09/06 DOA: 12/29/2018  PCP: Daniel Rossetti, MD  Patient coming from: Home.  I have personally briefly reviewed patient's old medical records in [place]  Chief Complaint: Abdominal pain.  HPI: [facility] is a 63 y.o. male with medical history significant of complicated Crohn's disease status post multiple surgeries including partial colectomy with ostomy in place, recurrent partial small bowel obstructions, especially in the hernia, chronic abdomen pain as well as metastatic prostate cancer.  Patient had multiple hospitalizations in the past.  He presents to the hospital with 1 day history of abdominal pain.  According to the patient, started last night, mostly periumbilical, dull aching, 6 out of 10 at its worse, eating makes it worse.  Sudden onset.  Relieved with morphine in the ER.  No fever or chills. Pertinent negative, no nausea or vomiting.  Appetite poor due to pain.  He has adequate output in his ostomy and was full of loose stool in the ER.  He is on chronic opiates.  Denies any other symptoms.  Has been followed by his GI surgery and been evaluated for hernia repairs. Denies any fever chills.  Denies any headache nausea vomiting.  Denies any chest pain or shortness of breath or wheezing.  Urine habit is normal.  Denies any focal weakness.  ED Course: On arrival to emergency room, patient is hemodynamically stable.  He is not in any distress.  His electrolytes are normal.  WBC count is normal.  A CT scan of the abdomen pelvis with contrast shows extensive previous surgical changes.  It does show a small bowel obstruction-like features with multiple dilated bowel loops, however clinical examination patient has plenty of stool on his ostomy and not actively nauseated vomiting.  Suspected partial small bowel obstruction, will need monitoring and treatment in the hospital.  Review of Systems: As per HPI otherwise  10 point review of systems negative.    Past Medical History:  Diagnosis Date  . Abnormal finding of biliary tract    MRCP shows pancreatic/biliary tract dilation. EUS 2010 confirmed dilation but no chronic pancreaitis or mass. Vascular ectasia crimpoing distal CBD.   [name] Anxiety   . 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 Duodenal ulcer 2010   nsaids  . History of blood transfusion 1992   "related to colon OR"  . Peristomal hernia   . Prostate cancer ([facility]) 2018  . SBO (small bowel obstruction) ([facility]) 11/10/2018  . Small bowel obstruction ([facility]) 10/2017; 11/21/2017; 02/16/2018  . Spigelian hernia    bilateral    Past Surgical History:  Procedure Laterality Date  . APPENDECTOMY  1992   at time of exp laparotomy at which time he was noted to have fistulizing Crohn's rather than UC  . COLON SURGERY    . COLONOSCOPY [name] 08/23/2014   [facility]:PRXYVOP proctoscopy with possible fistulous opening in thebase of rectal/anal stump.    . COLOSTOMY  1992   transverse loop colostomy secondary to a stricture  . ESOPHAGOGASTRODUODENOSCOPY  05/2009   SLF: multiple antral erosions, large ulcer at ansatomosis (postsurgical changes at duodenal bulb and second portion of duodenum) BX c/x NSAIDS.  [name]  EUS  10/04/2009   [facility] dilated CBD and main pancreatic duct.  No pancreatic  . EXPLORATORY LAPAROTOMY  1992  . FLEXIBLE SIGMOIDOSCOPY  1988   [facility]- suggested rohn's disease but the biopsies were not collaborative.  [name] FLEXIBLE SIGMOIDOSCOPY [name] 09/08/2016   Procedure: FLEXIBLE SIGMOIDOSCOPY;  Surgeon: Daniel Horner, MD;  Location: [facility];  Service:  Gastroenterology;  Laterality: [name];  . HEMICOLOECTOMY W/ ANASTOMOSIS  1993   R- [facility]   . HERNIA REPAIR  1996   incarcerated periostial hernia with additional surgery in 1999  . IR FLUORO GUIDE PORT INSERTION RIGHT  04/03/2017  . IR US GUIDE VASC ACCESS RIGHT  04/03/2017     reports that he quit smoking about 9 years ago. His smoking use included cigarettes. He has a 24.00 pack-year smoking history. He has never used smokeless tobacco. He reports that he does not drink alcohol or use drugs.  Allergies  Allergen Reactions  . Penicillins Hives    Has patient had a [facility] reaction causing immediate rash, facial/tongue/throat swelling, SOB or lightheadedness with hypotension: Yes Has patient had a [facility] reaction causing severe rash involving mucus membranes or skin necrosis: No Has patient had a Daniel Howard reaction that required hospitalization: No Has patient had a Daniel Howard reaction occurring within the last 10 years: No If all of the above answers are "NO", then may proceed with Cephalosporin use.     Family History  Problem Relation Age of Onset  . Cancer Father        prostate   . Prostate cancer Father   . Colon cancer Father 12  . Hypertension Sister   . Cancer Sister   . Depression Sister   . Breast cancer Sister   . COPD Sister   . Aneurysm Brother        deceased, brain aneurysm    Prior to Admission medications   Medication Sig Start Date End Date Taking? Authorizing Provider  Adalimumab (HUMIRA PEN) 40 MG/0.8ML PNKT Inject 1 Syringe into the skin once a week. Patient taking differently: Inject 1 Syringe into the skin every Wednesday.  09/01/18  Yes Daniel Needs, NP  budesonide (ENTOCORT EC) 3 MG 24 hr capsule Take 3 capsules (9 mg total) by mouth daily. Patient taking differently: Take 6 mg by mouth daily.  12/03/18  Yes Daniel Haff, MD  calcium carbonate (OS-CAL) 600 MG TABS tablet Take 1,200 mg by mouth daily with breakfast.   Yes [provider]  ferrous sulfate  325 (65 FE) MG tablet Take 325 mg by mouth daily with breakfast.   Yes [provider]  HYDROcodone-acetaminophen (NORCO) 10-325 MG tablet Take 1 tablet by mouth every 8 (eight) hours as needed. Patient taking differently: Take 1 tablet by mouth every 8 (eight) hours as needed for moderate pain.  12/08/18  Yes Flowella, [name], MD  lidocaine-prilocaine (EMLA) cream Apply 1 application topically as needed (when accessing port).   Yes [provider]  mirtazapine (REMERON) 15 MG tablet TAKE 1 TABLET BY MOUTH AT BEDTIME Patient taking differently: Take 15 mg by mouth at bedtime.  09/27/18  Yes Rafael Capo, [name], MD  Multiple Vitamins-Minerals (CENTRUM SILVER ADULT 50+) TABS Take 1 tablet by mouth daily.   Yes [provider]  XTANDI 40 MG capsule TAKE 4 CAPSULES (160 MG TOTAL) BY MOUTH DAILY. Patient taking differently: Take 160 mg by mouth daily.  12/14/18  Yes [name], MD    Physical Exam: Vitals:  12/29/18 1200 12/29/18 1230 12/29/18 1400 12/29/18 1600  BP: 113/82 110/69 112/74 124/76  Pulse: 79 80 82 77  Resp: 20     Temp:      TempSrc:      SpO2: 100% 99% 99% 100%    Constitutional: NAD, calm, comfortable Vitals:   12/29/18 1200 12/29/18 1230 12/29/18 1400 12/29/18 1600  BP: 113/82 110/69 112/74 124/76  Pulse: 79 80 82 77  Resp: 20     Temp:      TempSrc:      SpO2: 100% 99% 99% 100%   Eyes: PERRL, lids and conjunctivae normal ENMT: Mucous membranes are moist. Posterior pharynx clear of any exudate or lesions.Normal dentition.  Neck: normal, supple, no masses, no thyromegaly Respiratory: clear to auscultation bilaterally, no wheezing, no crackles. Normal respiratory effort. No accessory muscle use.  Cardiovascular: Regular rate and rhythm, no murmurs / rubs / gallops. No extremity edema. 2+ pedal pulses. No carotid bruits.  Abdomen: Extremity abdomen is soft, minimally tender around the umbilicus, surgical scars clean and dry.  No rigidity or  guarding.  No localized tenderness.  Right-sided ostomy with loose stool.  Bowel sounds are present. Musculoskeletal: no clubbing / cyanosis. No joint deformity upper and lower extremities. Good ROM, no contractures. Normal muscle tone.  Skin: no rashes, lesions, ulcers. No induration Neurologic: CN 2-12 grossly intact. Sensation intact, DTR normal. Strength 5/5 in all 4.  Psychiatric: Normal judgment and insight. Alert and oriented x 3. Normal mood.    Labs on Admission: I have personally reviewed following labs and imaging studies  CBC: Recent Labs  Lab 12/29/18 1121  WBC 7.0  HGB 13.4  HCT 41.5  MCV 97.6  PLT 591*   Basic Metabolic Panel: Recent Labs  Lab 12/29/18 1121  NA 137  K 4.0  CL 100  CO2 28  GLUCOSE 119*  BUN 9  CREATININE 1.09  CALCIUM 9.3   GFR: CrCl cannot be calculated (Unknown ideal weight.). Liver Function Tests: Recent Labs  Lab 12/29/18 1121  AST 26  ALT 18  ALKPHOS 55  BILITOT 0.8  PROT 8.0  ALBUMIN 3.4*   Recent Labs  Lab 12/29/18 1121  LIPASE 22   No results for input(s): AMMONIA in the last 168 hours. Coagulation Profile: No results for input(s): INR, PROTIME in the last 168 hours. Cardiac Enzymes: No results for input(s): CKTOTAL, CKMB, CKMBINDEX, TROPONINI in the last 168 hours. BNP (last 3 results) No results for input(s): PROBNP in the last 8760 hours. HbA1C: No results for input(s): HGBA1C in the last 72 hours. CBG: No results for input(s): GLUCAP in the last 168 hours. Lipid Profile: No results for input(s): CHOL, HDL, LDLCALC, TRIG, CHOLHDL, LDLDIRECT in the last 72 hours. Thyroid Function Tests: No results for input(s): TSH, T4TOTAL, FREET4, T3FREE, THYROIDAB in the last 72 hours. Anemia Panel: No results for input(s): VITAMINB12, FOLATE, FERRITIN, TIBC, IRON, RETICCTPCT in the last 72 hours. Urine analysis:    Component Value Date/Time   COLORURINE YELLOW 12/02/2018 2322   APPEARANCEUR CLEAR 12/02/2018 2322    LABSPEC >1.046 (H) 12/02/2018 2322   PHURINE 5.0 12/02/2018 2322   GLUCOSEU NEGATIVE 12/02/2018 2322   HGBUR SMALL (A) 12/02/2018 2322   BILIRUBINUR NEGATIVE 12/02/2018 2322   KETONESUR NEGATIVE 12/02/2018 2322   PROTEINUR NEGATIVE 12/02/2018 2322   UROBILINOGEN 0.2 10/10/2014 1037   NITRITE NEGATIVE 12/02/2018 2322   LEUKOCYTESUR NEGATIVE 12/02/2018 2322    Radiological Exams on Admission: Ct Abdomen Pelvis W Contrast  Result Date: 12/29/2018 CLINICAL DATA:  Acute abdominal pain. Crohn's disease. Prostate cancer. EXAM: CT ABDOMEN AND PELVIS WITH CONTRAST TECHNIQUE: Multidetector CT imaging of the abdomen and pelvis was performed using the standard protocol following bolus administration of intravenous contrast. CONTRAST:  175m OMNIPAQUE IOHEXOL 300 MG/ML  SOLN COMPARISON:  CT scan dated 12/02/2018 FINDINGS: Lower chest: No acute abnormality. Hepatobiliary: Liver parenchyma is normal. Gallbladder is slightly distended. Biliary tree is slightly distended chronically. No discrete stones or gallbladder wall thickening. Pancreas: Chronic dilatation of the pancreatic duct. However, the duct is more prominent than on the prior study of 12/02/2018, now measuring 6 mm in diameter. The duct is dilated to the level of the ampulla. No discrete masses of the pancreas. Spleen: Normal in size. Small enhancing lesions in the spleen consistent with benign hemangiomas as previously described. Adrenals/Urinary Tract: Adrenal glands are normal. Stable tiny cysts in each kidney. No hydronephrosis. Stable stone in the left posterior aspect of the bladder. Bladder is otherwise normal. Stomach/Bowel: There is increased distention of numerous small bowel loops but there are some nondistended bowel loops. The stomach is not distended. There is a large chronic left mid abdominal wall hernia at the site of an ostomy for the descending colon containing nondilated loops of small bowel and a dilated segment of the descending  colon. There is less distended small bowel in this hernia than on the prior study. There is a ostomy in the right mid abdomen which appears unchanged. Vascular/Lymphatic: There are several stable left periaortic lymph nodes as well as several small lymph nodes in the mesentery in the right mid abdomen. These are probably reactive in origin. Reproductive: Prostate is unremarkable. Other: No free air or free fluid. Musculoskeletal: No acute bone abnormality. Multiple sclerotic lesions in the bones consistent with metastatic prostate cancer, unchanged. IMPRESSION: 1. Increased distention of numerous small bowel loops with some nondistended small bowel loops. This could represent a partial or early complete small bowel obstruction. 2. Chronic dilatation of the pancreatic duct. The duct is more prominent than on the prior study of 12/02/2018. Electronically Signed   By: JLorriane ShireM.D.   On: 12/29/2018 14:55    EKG: Not performed today.  Assessment/Plan Principal Problem:   Partial small bowel obstruction (HCC) Active Problems:   Crohn's disease of both small and large intestine with complication (HCC)   [place]   Small bowel obstruction due to adhesions (HCC)  Partial small bowel obstruction: Patient has moderate abdominal pain, no evidence of active vomiting.  He has adequate stool in his ostomy.  Given extensive abdominal surgery history and risk of additions causing partial bowel obstruction, will agree with admission and treatment in the hospital. Admit, n.p.o. except ice chips, IV fluids, adequate pain medications and nausea medications.  Serial abdominal exam.  If no adequate improvement, will consult surgery.  Crohn's disease with frequent exacerbations: Recently finished steroid therapy.  He has been followed by GI.  He will continue outpatient follow-up.    DVT prophylaxis: Lovenox.  Code Status: Full code.   Family Communication: Wife at the bedside.   Disposition  Plan: Home.   Consults called: none  Admission status: Inpatient.    KBarb MerinoMD [facility] Pager [phone] If 7PM-7AM, please contact night-coverage [email or web address] Password TAnn Klein [facility] 12/29/2018, 4:51 PM

## 2018-12-29 NOTE — ED Notes (Signed)
Pt refusing Vicodin at this time "don't give me that, that does not work" Reports improved nausea. Resting, aware of holding status at this time.

## 2018-12-29 NOTE — ED Notes (Signed)
ED TO INPATIENT HANDOFF REPORT  Name/Age/Gender Daniel Howard 63 y.o. male  Code Status    Code Status Orders  (From admission, onward)         Start     Ordered   12/29/18 2138  Full code  Continuous     12/29/18 2137        Code Status History    Date Active Date Inactive Code Status Order ID Comments User Context   12/03/2018 0038 12/03/2018 1648 Partial Code [id number]  Lenore Cordia, MD ED   11/10/2018 1545 11/16/2018 1901 Full Code [id number]  Janora Norlander, MD ED   11/01/2018 1954 11/03/2018 2301 Full Code [id number]  Etta Quill, DO ED   09/30/2018 1443 10/02/2018 1412 Full Code [id number]  Bonnell Public, MD Inpatient   07/03/2018 0435 07/06/2018 1834 Full Code [id number]  Ivor Costa, MD ED   04/17/2018 0841 04/19/2018 1613 Full Code [id number]  Rondel Jumbo, PA-C ED   03/16/2018 2317 03/19/2018 1657 Full Code [id number]  Vianne Bulls, MD ED   02/17/2018 0103 02/18/2018 1539 Full Code [id number]  Rise Patience, MD ED   11/22/2017 0151 11/26/2017 1718 Full Code [id number]  Roxan Hockey, MD ED   11/14/2017 2348 11/16/2017 1953 Full Code [id number]  Florencia Reasons, MD Inpatient   09/04/2016 1541 09/09/2016 1620 Full Code [id number]  Samella Parr, NP Inpatient   07/26/2014 2147 07/28/2014 2238 Full Code [id number]  Thurnell Lose, MD Inpatient   02/06/2012 0021 02/08/2012 1442 Full Code [id number]  Pasty Arch, RN Inpatient      Home/SNF/Other Home  Chief Complaint Body Pain  Level of Care/Admitting Diagnosis ED Disposition    ED Disposition Condition [facility] Area: [facility] [[id number]]  Level of Care: Med-Surg [16]  Diagnosis: Small bowel obstruction due to adhesions [facility] [[id number]]  Admitting Physician: Barb Merino [[id number]]  Attending Physician: Barb Merino [[id number]]  Estimated length of stay: past midnight tomorrow  Certification:: I certify this patient will need inpatient services for at least 2  midnights  PT Class (Do Not Modify): Inpatient [101]  PT Acc Code (Do Not Modify): Private [1]       Medical History Past Medical History:  Diagnosis Date  . Abnormal finding of biliary tract    MRCP shows pancreatic/biliary tract dilation. EUS 2010 confirmed dilation but no chronic pancreaitis or mass. Vascular ectasia crimpoing distal CBD.   [name] Anxiety   . 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 Duodenal ulcer 2010   nsaids  . History of blood transfusion 1992   "related to colon OR"  . Peristomal hernia   . Prostate cancer ([place]) 2018  . SBO (small bowel obstruction) ([place]) 11/10/2018  . Small bowel obstruction ([place]) 10/2017; 11/21/2017; 02/16/2018  . Spigelian hernia    bilateral    Allergies Allergies  Allergen Reactions  . Penicillins Hives    Has patient had a PCN reaction causing immediate rash, facial/tongue/throat swelling, SOB or lightheadedness with hypotension: Yes Has patient had a PCN reaction causing severe rash involving mucus membranes or skin necrosis: No Has patient had a PCN reaction that required hospitalization: No Has  patient had a PCN reaction occurring within the last 10 years: No If all of the above answers are "NO", then may proceed with Cephalosporin use.     IV Location/Drains/Wounds Patient Lines/Drains/Airways Status   Active Line/Drains/Airways    Name:   Placement date:   Placement time:   Site:   Days:   Implanted Port Right Chest   -    -    Chest      Peripheral IV 12/29/18 Right Arm   12/29/18    1341    Arm   less than 1   Ileostomy RUQ   07/26/14    2348    RUQ   1617   Ileostomy Standard (end)  RUQ   01/23/91    1200    RUQ   10202          Labs/Imaging Results for orders placed or performed during the hospital encounter of 12/29/18 (from the past 48 hour(s))  Urinalysis, Routine w reflex microscopic     Status: Abnormal   Collection Time: 12/29/18 11:14 AM  Result Value Ref Range   Color, Urine YELLOW YELLOW   APPearance CLEAR CLEAR   Specific Gravity, Urine >1.046 (H) 1.005 - 1.030   pH 6.0 5.0 - 8.0   Glucose, UA NEGATIVE NEGATIVE mg/dL   Hgb urine dipstick NEGATIVE NEGATIVE   Bilirubin Urine NEGATIVE NEGATIVE   Ketones, ur NEGATIVE NEGATIVE mg/dL   Protein, ur NEGATIVE NEGATIVE mg/dL   Nitrite NEGATIVE NEGATIVE   Leukocytes, UA NEGATIVE NEGATIVE    Comment: Performed at [facility] [address] [place], [place] [address]  Lipase, blood     Status: None   Collection Time: 12/29/18 11:21 AM  Result Value Ref Range   Lipase 22 11 - 51 U/L    Comment: Performed at [facility] [address] [place], [place] [address]  Comprehensive metabolic panel     Status: Abnormal   Collection Time: 12/29/18 11:21 AM  Result Value Ref Range   Sodium 137 135 - 145 mmol/L   Potassium 4.0 3.5 - 5.1 mmol/L   Chloride 100 98 - 111 mmol/L   CO2 28 22 - 32 mmol/L   Glucose, Bld 119 (H) 70 - 99 mg/dL   BUN 9 6 - 20 mg/dL   Creatinine, Ser 1.09 0.61 - 1.24 mg/dL   Calcium 9.3 8.9 - 10.3 mg/dL   Total Protein 8.0 6.5 - 8.1 g/dL   Albumin 3.4 (L) 3.5 - 5.0 g/dL   AST 26 15 - 41 U/L   ALT 18 0 - 44 U/L   Alkaline Phosphatase 55 38 - 126 U/L   Total Bilirubin 0.8 0.3 - 1.2 mg/dL   GFR calc non Af Amer >60 >60 mL/min   GFR calc Af Amer >60 >60 mL/min   Anion gap 9 5 - 15    Comment: Performed at [facility] [address] [place], [place] [address]  CBC     Status: Abnormal   Collection Time: 12/29/18 11:21 AM  Result Value Ref Range   WBC 7.0 4.0 - 10.5 K/uL   RBC 4.25 4.22 - 5.81 MIL/uL   Hemoglobin 13.4 13.0 - 17.0 g/dL   HCT 41.5 39.0 - 52.0 %    MCV 97.6 80.0 - 100.0 fL   MCH 31.5 26.0 - 34.0 pg   MCHC 32.3 30.0 - 36.0 g/dL   RDW 12.3 11.5 - 15.5 %   Platelets 453 (  H) 150 - 400 K/uL   nRBC 0.0 0.0 - 0.2 %    Comment: Performed at [facility], [place] [address], [place], [place] [address]   Ct Abdomen Pelvis W Contrast  Result Date: 12/29/2018 CLINICAL DATA:  Acute abdominal pain. Crohn's disease. Prostate cancer. EXAM: CT ABDOMEN AND PELVIS WITH CONTRAST TECHNIQUE: Multidetector CT imaging of the abdomen and pelvis was performed using the standard protocol following bolus administration of intravenous contrast. CONTRAST:  155m OMNIPAQUE IOHEXOL 300 MG/ML  SOLN COMPARISON:  CT scan dated 12/02/2018 FINDINGS: Lower chest: No acute abnormality. Hepatobiliary: Liver parenchyma is normal. Gallbladder is slightly distended. Biliary tree is slightly distended chronically. No discrete stones or gallbladder wall thickening. Pancreas: Chronic dilatation of the pancreatic duct. However, the duct is more prominent than on the prior study of 12/02/2018, now measuring 6 mm in diameter. The duct is dilated to the level of the ampulla. No discrete masses of the pancreas. Spleen: Normal in size. Small enhancing lesions in the spleen consistent with benign hemangiomas as previously described. Adrenals/Urinary Tract: Adrenal glands are normal. Stable tiny cysts in each kidney. No hydronephrosis. Stable stone in the left posterior aspect of the bladder. Bladder is otherwise normal. Stomach/Bowel: There is increased distention of numerous small bowel loops but there are some nondistended bowel loops. The stomach is not distended. There is a large chronic left mid abdominal wall hernia at the site of an ostomy for the descending colon containing nondilated loops of small bowel and a dilated segment of the descending colon. There is less distended small bowel in this hernia than on the prior study. There is a ostomy in the right mid abdomen which appears unchanged.  Vascular/Lymphatic: There are several stable left periaortic lymph nodes as well as several small lymph nodes in the mesentery in the right mid abdomen. These are probably reactive in origin. Reproductive: Prostate is unremarkable. Other: No free air or free fluid. Musculoskeletal: No acute bone abnormality. Multiple sclerotic lesions in the bones consistent with metastatic prostate cancer, unchanged. IMPRESSION: 1. Increased distention of numerous small bowel loops with some nondistended small bowel loops. This could represent a partial or early complete small bowel obstruction. 2. Chronic dilatation of the pancreatic duct. The duct is more prominent than on the prior study of 12/02/2018. Electronically Signed   By: JLorriane ShireM.D.   On: 12/29/2018 14:55   None  Pending Labs Unresulted Labs (From admission, onward)    Start     Ordered   12/30/18 0500  CBC  Tomorrow morning,   R     12/29/18 2137   12/30/18 06378 Basic metabolic panel  Tomorrow morning,   R     12/29/18 2137   12/30/18 0500  Magnesium  Tomorrow morning,   R     12/29/18 2137   12/30/18 0500  Phosphorus  Tomorrow morning,   R     12/29/18 2137          Vitals/Pain Today's Vitals   12/29/18 1845 12/29/18 2051 12/29/18 2143 12/29/18 2143  BP: 119/82 115/76    Pulse: 68 84    Resp: 18 16    Temp:      TempSrc:      SpO2: 99% 98%    PainSc:   10-Worst pain ever 10-Worst pain ever    Isolation Precautions No active isolations  Medications Medications  HYDROcodone-acetaminophen (NORCO) 10-325 MG per tablet 1 tablet (has no administration in time range)  enzalutamide ([name]  capsule 160 mg (has no administration in time range)  mirtazapine (REMERON) tablet 15 mg (has no administration in time range)  ferrous sulfate tablet 325 mg (has no administration in time range)  enoxaparin (LOVENOX) injection 30 mg (has no administration in time range)  acetaminophen (TYLENOL) tablet 650 mg (has no administration in time  range)    Or  acetaminophen (TYLENOL) suppository 650 mg (has no administration in time range)  ondansetron (ZOFRAN) tablet 4 mg ( Oral See Alternative 12/29/18 1927)    Or  ondansetron (ZOFRAN) injection 4 mg (4 mg Intravenous Given 12/29/18 1927)  morphine 2 MG/ML injection 2 mg (2 mg Intravenous Given 12/29/18 2149)  HYDROmorphone (DILAUDID) injection 1 mg (1 mg Intravenous Given 12/29/18 1343)  sodium chloride 0.9 % bolus 1,000 mL (0 mLs Intravenous Stopped 12/29/18 1428)  ondansetron (ZOFRAN) injection 4 mg (4 mg Intravenous Given 12/29/18 1342)  iohexol (OMNIPAQUE) 300 MG/ML solution 100 mL (100 mLs Intravenous Contrast Given 12/29/18 1418)  morphine 4 MG/ML injection 4 mg (4 mg Intravenous Given 12/29/18 1725)    Mobility walks

## 2018-12-29 NOTE — ED Triage Notes (Signed)
Pt reports abdominal pain, states "I feel like I am having a blockage again." Hx of Crohns and multiple hernias. Denies n/v/f. A/O at triage.

## 2018-12-29 NOTE — Progress Notes (Signed)
Patient arrived to floor from ED. Alert and oriented x4. Skin intact. Pain level at 2 right now. Patient states he feels okay for now.

## 2018-12-29 NOTE — ED Notes (Signed)
Pt aware that a urine sample is needed and will provide one when able.

## 2018-12-29 NOTE — ED Provider Notes (Signed)
[facility] EMERGENCY DEPARTMENT Provider Note   CSN: [id number] Arrival date & time: 12/29/18  1059     History   Chief Complaint Chief Complaint  Patient presents with  . Abdominal Pain    HPI Daniel Howard is a 63 y.o. male.  HPI Daniel Howard is a 63 y.o. male with medical history significant for Complicated Crohn's disease s/p multiple surgeries including partial colectomy with ostomy in place, recurrent partial small bowel obstructions, spigelian hernia, and metastatic prostate cancer who presents the emergency department today with complaints of central abdominal pain and feeling like he is having a another small bowel obstruction.  He has had a history of partial small bowel obstructions and bowel obstructions.  He still had some output out of his ostomy today.  He reports nausea but denies vomiting.  Denies fevers and chills  Past Medical History:  Diagnosis Date  . Abnormal finding of biliary tract    MRCP shows pancreatic/biliary tract dilation. EUS 2010 confirmed dilation but no chronic pancreaitis or mass. Vascular ectasia crimpoing distal CBD.   [name] Anxiety   . 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 Duodenal ulcer 2010   nsaids  . History of blood transfusion 1992   "related to colon OR"  . Peristomal hernia   . Prostate cancer ([facility]) 2018  . SBO (small bowel obstruction) ([place]) 11/10/2018  . Small bowel obstruction ([place]) 10/2017; 11/21/2017; 02/16/2018  . Spigelian hernia    bilateral    Patient Active Problem List   Diagnosis Date Noted  . Small bowel obstruction ([place]) 12/03/2018  . Partial small  bowel obstruction ([place]) 11/01/2018  . SBO (small bowel obstruction) ([place]) 09/30/2018  . Iron deficiency anemia 07/03/2018  . 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 Nausea without vomiting 11/10/2017  . Incisional hernia 09/22/2017  . Abdominal pain of multiple sites 09/22/2017  . Goals of care, counseling/discussion 04/02/2017  . Prostate cancer metastatic to bone ([place]) 04/02/2017  . Elevated PSA 03/04/2017  . Osteopenia 11/03/2016  . Pelvic mass in male 09/04/2016  . 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 Exacerbation of Crohn's disease ([place]) 09/04/2016  . Protein-calorie malnutrition, severe ([place]) 07/27/2014  . Loss of weight 06/09/2014  . Crohn's disease of both small and large intestine with complication ([place]) 67/59/1638  . Boils 02/11/2013  . Ventral hernia 12/11/2009  . Anemia 12/03/2009  . Regional enteritis/Crohn's 01/25/2007    Past Surgical History:  Procedure Laterality Date  . APPENDECTOMY  1992   at time of exp laparotomy at which time he was noted to have fistulizing Crohn's rather than UC  . COLON SURGERY    . COLONOSCOPY [name] 08/23/2014   [id number] proctoscopy with possible fistulous opening in thebase of rectal/anal stump.    . COLOSTOMY  1992   transverse loop colostomy secondary to a stricture  . ESOPHAGOGASTRODUODENOSCOPY  05/2009   SLF: multiple antral erosions, large ulcer at ansatomosis (postsurgical changes at duodenal bulb and second portion of duodenum) BX c/x NSAIDS.  . EUS  10/04/2009   Dr. Estill Bakes dilated CBD and main pancreatic duct.  No pancreatic  . EXPLORATORY LAPAROTOMY  1992  . FLEXIBLE SIGMOIDOSCOPY  1988   Dr. Laural Golden-  suggested rohn's disease but the biopsies were not collaborative.  [name] FLEXIBLE SIGMOIDOSCOPY [name] 09/08/2016   Procedure: FLEXIBLE SIGMOIDOSCOPY;  Surgeon: Wonda Horner, MD;  Location: [facility] ENDOSCOPY;  Service: Gastroenterology;  Laterality: [name];  . HEMICOLOECTOMY W/ ANASTOMOSIS  1993   R- Dr.DeMason   . HERNIA REPAIR  1996    incarcerated periostial hernia with additional surgery in 1999  . IR FLUORO GUIDE PORT INSERTION RIGHT  04/03/2017  . IR US GUIDE VASC ACCESS RIGHT  04/03/2017        Home Medications    Prior to Admission medications   Medication Sig Start Date End Date Taking? Authorizing Provider  Adalimumab (HUMIRA PEN) 40 MG/0.8ML PNKT Inject 1 Syringe into the skin once a week. Patient taking differently: Inject 1 Syringe into the skin every Wednesday.  09/01/18  Yes Annitta Needs, NP  budesonide (ENTOCORT EC) 3 MG 24 hr capsule Take 3 capsules (9 mg total) by mouth daily. Patient taking differently: Take 6 mg by mouth daily.  12/03/18  Yes Bonnielee Haff, MD  calcium carbonate (OS-CAL) 600 MG TABS tablet Take 1,200 mg by mouth daily with breakfast.   Yes [provider]  ferrous sulfate 325 (65 FE) MG tablet Take 325 mg by mouth daily with breakfast.   Yes [provider]  HYDROcodone-acetaminophen (NORCO) 10-325 MG tablet Take 1 tablet by mouth every 8 (eight) hours as needed. Patient taking differently: Take 1 tablet by mouth every 8 (eight) hours as needed for moderate pain.  12/08/18  Yes [place], [name], MD  lidocaine-prilocaine (EMLA) cream Apply 1 application topically as needed (when accessing port).   Yes [provider]  mirtazapine (REMERON) 15 MG tablet TAKE 1 TABLET BY MOUTH AT BEDTIME Patient taking differently: Take 15 mg by mouth at bedtime.  09/27/18  Yes [place], [name], MD  Multiple Vitamins-Minerals (CENTRUM SILVER ADULT 50+) TABS Take 1 tablet by mouth daily.   Yes [provider]  XTANDI 40 MG capsule TAKE 4 CAPSULES (160 MG TOTAL) BY MOUTH DAILY. Patient taking differently: Take 160 mg by mouth daily.  12/14/18  Yes [name], MD    Family History Family History  Problem Relation Age of Onset  . Cancer Father        prostate   . Prostate cancer Father   . Colon cancer Father 76  . Hypertension Sister   . Cancer Sister   .  Depression Sister   . Breast cancer Sister   . COPD Sister   . Aneurysm Brother        deceased, brain aneurysm    Social History Social History   Tobacco Use  . Smoking status: Former Smoker    Packs/day: 0.75    Years: 32.00    Pack years: 24.00    Types: Cigarettes    Last attempt to quit: 12/21/2009    Years since quitting: 9.0  . Smokeless tobacco: Never Used  Substance Use Topics  . Alcohol use: No    Alcohol/week: 0.0 standard drinks    Comment: Former drinker  . Drug use: No     Allergies   Penicillins   Review of Systems Review of Systems  All other systems reviewed and are negative.    Physical Exam Updated Vital Signs BP 112/74   Pulse 82   Temp 97.8 F (36.6 C) (Oral)   Resp 20   SpO2 99%   Physical Exam Vitals signs and nursing note reviewed.  Constitutional:  Appearance: He is well-developed.  HENT:     Head: Normocephalic and atraumatic.  Neck:     Musculoskeletal: Normal range of motion.  Cardiovascular:     Rate and Rhythm: Normal rate and regular rhythm.     Heart sounds: Normal heart sounds.  Pulmonary:     Effort: Pulmonary effort is normal. No respiratory distress.     Breath sounds: Normal breath sounds.  Abdominal:     General: There is no distension.     Comments: Mild abdominal distention.  Mild generalized abdominal tenderness without peritoneal signs.  Light brown stool liquid stool in the ostomy  Musculoskeletal: Normal range of motion.  Skin:    General: Skin is warm and dry.  Neurological:     Mental Status: He is alert and oriented to person, place, and time.  Psychiatric:        Judgment: Judgment normal.      ED Treatments / Results  Labs (all labs ordered are listed, but only abnormal results are displayed) Labs Reviewed  COMPREHENSIVE METABOLIC PANEL - Abnormal; Notable for the following components:      Result Value   Glucose, Bld 119 (*)    Albumin 3.4 (*)    All other components within normal  limits  CBC - Abnormal; Notable for the following components:   Platelets 453 (*)    All other components within normal limits  LIPASE, BLOOD  URINALYSIS, ROUTINE W REFLEX MICROSCOPIC    EKG None  Radiology Ct Abdomen Pelvis W Contrast  Result Date: 12/29/2018 CLINICAL DATA:  Acute abdominal pain. Crohn's disease. Prostate cancer. EXAM: CT ABDOMEN AND PELVIS WITH CONTRAST TECHNIQUE: Multidetector CT imaging of the abdomen and pelvis was performed using the standard protocol following bolus administration of intravenous contrast. CONTRAST:  134m OMNIPAQUE IOHEXOL 300 MG/ML  SOLN COMPARISON:  CT scan dated 12/02/2018 FINDINGS: Lower chest: No acute abnormality. Hepatobiliary: Liver parenchyma is normal. Gallbladder is slightly distended. Biliary tree is slightly distended chronically. No discrete stones or gallbladder wall thickening. Pancreas: Chronic dilatation of the pancreatic duct. However, the duct is more prominent than on the prior study of 12/02/2018, now measuring 6 mm in diameter. The duct is dilated to the level of the ampulla. No discrete masses of the pancreas. Spleen: Normal in size. Small enhancing lesions in the spleen consistent with benign hemangiomas as previously described. Adrenals/Urinary Tract: Adrenal glands are normal. Stable tiny cysts in each kidney. No hydronephrosis. Stable stone in the left posterior aspect of the bladder. Bladder is otherwise normal. Stomach/Bowel: There is increased distention of numerous small bowel loops but there are some nondistended bowel loops. The stomach is not distended. There is a large chronic left mid abdominal wall hernia at the site of an ostomy for the descending colon containing nondilated loops of small bowel and a dilated segment of the descending colon. There is less distended small bowel in this hernia than on the prior study. There is a ostomy in the right mid abdomen which appears unchanged. Vascular/Lymphatic: There are several  stable left periaortic lymph nodes as well as several small lymph nodes in the mesentery in the right mid abdomen. These are probably reactive in origin. Reproductive: Prostate is unremarkable. Other: No free air or free fluid. Musculoskeletal: No acute bone abnormality. Multiple sclerotic lesions in the bones consistent with metastatic prostate cancer, unchanged. IMPRESSION: 1. Increased distention of numerous small bowel loops with some nondistended small bowel loops. This could represent a partial or early complete small bowel  obstruction. 2. Chronic dilatation of the pancreatic duct. The duct is more prominent than on the prior study of 12/02/2018. Electronically Signed   By: Lorriane Shire M.D.   On: 12/29/2018 14:55    Procedures Procedures (including critical care time)  Medications Ordered in ED Medications  HYDROmorphone (DILAUDID) injection 1 mg (1 mg Intravenous Given 12/29/18 1343)  sodium chloride 0.9 % bolus 1,000 mL (0 mLs Intravenous Stopped 12/29/18 1428)  ondansetron (ZOFRAN) injection 4 mg (4 mg Intravenous Given 12/29/18 1342)  iohexol (OMNIPAQUE) 300 MG/ML solution 100 mL (100 mLs Intravenous Contrast Given 12/29/18 1418)     Initial Impression / Assessment and Plan / ED Course  I have reviewed the triage vital signs and the nursing notes.  Pertinent labs & imaging results that were available during my care of the patient were reviewed by me and considered in my medical decision making (see chart for details).     CT scan concerning for partial small to near complete small bowel obstruction.  No vomiting in the ER.  Patient states that he has resolved before without an NG tube and he does not want an NG tube at this time.  He understands that this would be the normal standard of care.  Patient will be admitted for pain and symptom control and bowel rest with additional IV fluids.  Final Clinical Impressions(s) / ED Diagnoses   Final diagnoses:  Small bowel obstruction [facility])     ED Discharge Orders    None       Jola Schmidt, MD 12/29/18 [phone]

## 2018-12-29 NOTE — ED Notes (Signed)
Patient transported to CT 

## 2018-12-30 ENCOUNTER — Inpatient Hospital Stay (HOSPITAL_COMMUNITY): Payer: Medicare Other

## 2018-12-30 DIAGNOSIS — K50819 Crohn's disease of both small and large intestine with unspecified complications: Secondary | ICD-10-CM

## 2018-12-30 DIAGNOSIS — K565 Intestinal adhesions [bands], unspecified as to partial versus complete obstruction: Secondary | ICD-10-CM

## 2018-12-30 DIAGNOSIS — Z933 Colostomy status: Secondary | ICD-10-CM

## 2018-12-30 DIAGNOSIS — K56600 Partial intestinal obstruction, unspecified as to cause: Secondary | ICD-10-CM

## 2018-12-30 LAB — CBC
HCT: 36.3 % — ABNORMAL LOW (ref 39.0–52.0)
Hemoglobin: 11.5 g/dL — ABNORMAL LOW (ref 13.0–17.0)
MCH: 30.4 pg (ref 26.0–34.0)
MCHC: 31.7 g/dL (ref 30.0–36.0)
MCV: 96 fL (ref 80.0–100.0)
Platelets: 392 10*3/uL (ref 150–400)
RBC: 3.78 MIL/uL — ABNORMAL LOW (ref 4.22–5.81)
RDW: 12.1 % (ref 11.5–15.5)
WBC: 7.8 10*3/uL (ref 4.0–10.5)
nRBC: 0 % (ref 0.0–0.2)

## 2018-12-30 LAB — BASIC METABOLIC PANEL
Anion gap: 8 (ref 5–15)
BUN: 9 mg/dL (ref 6–20)
CO2: 27 mmol/L (ref 22–32)
Calcium: 8.1 mg/dL — ABNORMAL LOW (ref 8.9–10.3)
Chloride: 101 mmol/L (ref 98–111)
Creatinine, Ser: 1.18 mg/dL (ref 0.61–1.24)
GFR calc Af Amer: >60 mL/min (ref >60)
GFR calc non Af Amer: >60 mL/min (ref >60)
Glucose, Bld: 97 mg/dL (ref 70–99)
Potassium: 3.9 mmol/L (ref 3.5–5.1)
Sodium: 136 mmol/L (ref 135–145)

## 2018-12-30 LAB — PHOSPHORUS: Phosphorus: 2.8 mg/dL (ref 2.5–4.6)

## 2018-12-30 LAB — MAGNESIUM: Magnesium: 1.8 mg/dL (ref 1.7–2.4)

## 2018-12-30 MED ORDER — SODIUM CHLORIDE 0.9 % IV SOLN
INTRAVENOUS | Status: DC
  Administered 2018-12-30 – 2019-01-01 (×5): via INTRAVENOUS

## 2018-12-30 MED ORDER — DIATRIZOATE MEGLUMINE & SODIUM 66-10 % PO SOLN
90.0000 mL | Freq: Once | ORAL | Status: AC
  Administered 2018-12-30: 90 mL via NASOGASTRIC
  Filled 2018-12-30: qty 90

## 2018-12-30 MED ORDER — HYDROMORPHONE HCL 1 MG/ML IJ SOLN
1.0000 mg | INTRAMUSCULAR | Status: DC | PRN
  Administered 2018-12-30 – 2019-01-02 (×15): 1 mg via INTRAVENOUS
  Filled 2018-12-30 (×15): qty 1

## 2018-12-30 MED ORDER — ENOXAPARIN SODIUM 40 MG/0.4ML ~~LOC~~ SOLN
40.0000 mg | Freq: Every day | SUBCUTANEOUS | Status: DC
  Administered 2018-12-30 – 2019-01-01 (×3): 40 mg via SUBCUTANEOUS
  Filled 2018-12-30 (×3): qty 0.4

## 2018-12-30 NOTE — Progress Notes (Signed)
PROGRESS NOTE    Daniel Howard  FTD:[id number] DOB: 12/22/1956 DOA: 12/29/2018 PCP: Alycia Rossetti, MD   Brief Narrative:  HPI on 12/29/2018 by Dr. Barb Merino Daniel Howard is a 63 y.o. male with medical history significant of complicated Crohn's disease status post multiple surgeries including partial colectomy with ostomy in place, recurrent partial small bowel obstructions, especially in the hernia, chronic abdomen pain as well as metastatic prostate cancer.  Patient had multiple hospitalizations in the past.  He presents to the hospital with 1 day history of abdominal pain.  According to the patient, started last night, mostly periumbilical, dull aching, 6 out of 10 at its worse, eating makes it worse.  Sudden onset.  Relieved with morphine in the ER.  No fever or chills. Pertinent negative, no nausea or vomiting.  Appetite poor due to pain.  He has adequate output in his ostomy and was full of loose stool in the ER.  He is on chronic opiates.  Denies any other symptoms.  Has been followed by his GI surgery and been evaluated for hernia repairs. Denies any fever chills.  Denies any headache nausea vomiting.  Denies any chest pain or shortness of breath or wheezing.  Urine habit is normal.  Denies any focal weakness.  Interim history Admitted with abdominal pain secondary to partial small bowel obstruction.  Will order small bowel protocol and continue to monitor. Assessment & Plan   Abdominal pain secondary to partial small bowel obstruction -Patient has had a significant history of Crohn's disease including several abdominal surgeries -Currently no active nausea or vomiting and feels that his abdominal pain has improved.  Still has some abdominal pain in the right lower quadrant. -CT -Will obtain a small bowel protocol -For now, will continue conservative management with IV fluids, antiemetics, pain control  Crohn's disease -Patient has had frequent exacerbations and recently  completed steroid therapy -[facility]  Prostate cancer -Patient follows with Dr. Alen Blew, oncology -Cotninue Xtandi  DVT Prophylaxis  lovenox  Code Status: Full  Family Communication: None at bedside  Disposition Plan: Admitted. Pending further improvement in abdominal pain.  Suspect discharged to home when stable.  Consultants None  Procedures  None  Antibiotics   Anti-infectives (From admission, onward)   None      Subjective:   Daniel Howard seen and examined today.  Continues to have abdominal pain mainly in the right lower quadrant.  Denies current nausea or vomiting, chest pain, shortness of breath, diarrhea or constipation, dizziness or headache.  Objective:   Vitals:   12/29/18 2051 12/29/18 2241 12/30/18 0337 12/30/18 0538  BP: 115/76 128/84 128/84 106/72  Pulse: 84 80 80 83  Resp: 16 18 18 16   Temp:  98.2 F (36.8 C) 98.2 F (36.8 C) 98.1 F (36.7 C)  TempSrc:  Oral Oral Oral  SpO2: 98% 98%  99%  Weight:  61.5 kg 61.5 kg   Height:   5' 4.5" (1.638 m)     Intake/Output Summary (Last 24 hours) at 12/30/2018 1010 Last data filed at 12/30/2018 0537 Gross per 24 hour  Intake -  Output 200 ml  Net -200 ml   Filed Weights   12/29/18 2241 12/30/18 0337  Weight: 61.5 kg 61.5 kg    Exam  General: Well developed, chronically ill-appearing, NAD  HEENT: NCAT, mucous membranes moist.   Neck: Supple  Cardiovascular: S1 S2 auscultated, no murmur, RRR  Respiratory: Clear to auscultation bilaterally with equal  chest rise  Abdomen: Soft, RLQ TTP, ostomy, nondistended, +bowel sounds  Extremities: warm dry without cyanosis clubbing or edema  Neuro: AAOx3, nonfocal  Psych: Normal affect and demeanor with intact judgement and insight   Data Reviewed: I have personally reviewed following labs and imaging studies  CBC: Recent Labs  Lab 12/29/18 1121 12/30/18 0310  WBC 7.0 7.8  HGB 13.4 11.5*  HCT  41.5 36.3*  MCV 97.6 96.0  PLT 453* 161   Basic Metabolic Panel: Recent Labs  Lab 12/29/18 1121 12/30/18 0310  NA 137 136  K 4.0 3.9  CL 100 101  CO2 28 27  GLUCOSE 119* 97  BUN 9 9  CREATININE 1.09 1.18  CALCIUM 9.3 8.1*  MG  --  1.8  PHOS  --  2.8   GFR: Estimated Creatinine Clearance: 56.9 mL/min (by C-G formula based on SCr of 1.18 mg/dL). Liver Function Tests: Recent Labs  Lab 12/29/18 1121  AST 26  ALT 18  ALKPHOS 55  BILITOT 0.8  PROT 8.0  ALBUMIN 3.4*   Recent Labs  Lab 12/29/18 1121  LIPASE 22   No results for input(s): AMMONIA in the last 168 hours. Coagulation Profile: No results for input(s): INR, PROTIME in the last 168 hours. Cardiac Enzymes: No results for input(s): CKTOTAL, CKMB, CKMBINDEX, TROPONINI in the last 168 hours. BNP (last 3 results) No results for input(s): PROBNP in the last 8760 hours. HbA1C: No results for input(s): HGBA1C in the last 72 hours. CBG: No results for input(s): GLUCAP in the last 168 hours. Lipid Profile: No results for input(s): CHOL, HDL, LDLCALC, TRIG, CHOLHDL, LDLDIRECT in the last 72 hours. Thyroid Function Tests: No results for input(s): TSH, T4TOTAL, FREET4, T3FREE, THYROIDAB in the last 72 hours. Anemia Panel: No results for input(s): VITAMINB12, FOLATE, FERRITIN, TIBC, IRON, RETICCTPCT in the last 72 hours. Urine analysis:    Component Value Date/Time   COLORURINE YELLOW 12/29/2018 1114   APPEARANCEUR CLEAR 12/29/2018 1114   LABSPEC >1.046 (H) 12/29/2018 1114   PHURINE 6.0 12/29/2018 1114   GLUCOSEU NEGATIVE 12/29/2018 1114   HGBUR NEGATIVE 12/29/2018 1114   BILIRUBINUR NEGATIVE 12/29/2018 1114   KETONESUR NEGATIVE 12/29/2018 1114   PROTEINUR NEGATIVE 12/29/2018 1114   UROBILINOGEN 0.2 10/10/2014 1037   NITRITE NEGATIVE 12/29/2018 1114   LEUKOCYTESUR NEGATIVE 12/29/2018 1114   Sepsis Labs: @LABRCNTIP (procalcitonin:4,lacticidven:4)  )No results found for this or any previous visit (from the  past 240 hour(s)).    Radiology Studies: Ct Abdomen Pelvis W Contrast  Result Date: 12/29/2018 CLINICAL DATA:  Acute abdominal pain. Crohn's disease. Prostate cancer. EXAM: CT ABDOMEN AND PELVIS WITH CONTRAST TECHNIQUE: Multidetector CT imaging of the abdomen and pelvis was performed using the standard protocol following bolus administration of intravenous contrast. CONTRAST:  136m OMNIPAQUE IOHEXOL 300 MG/ML  SOLN COMPARISON:  CT scan dated 12/02/2018 FINDINGS: Lower chest: No acute abnormality. Hepatobiliary: Liver parenchyma is normal. Gallbladder is slightly distended. Biliary tree is slightly distended chronically. No discrete stones or gallbladder wall thickening. Pancreas: Chronic dilatation of the pancreatic duct. However, the duct is more prominent than on the prior study of 12/02/2018, now measuring 6 mm in diameter. The duct is dilated to the level of the ampulla. No discrete masses of the pancreas. Spleen: Normal in size. Small enhancing lesions in the spleen consistent with benign hemangiomas as previously described. Adrenals/Urinary Tract: Adrenal glands are normal. Stable tiny cysts in each kidney. No hydronephrosis. Stable stone in the left posterior aspect of the bladder. Bladder is  otherwise normal. Stomach/Bowel: There is increased distention of numerous small bowel loops but there are some nondistended bowel loops. The stomach is not distended. There is a large chronic left mid abdominal wall hernia at the site of an ostomy for the descending colon containing nondilated loops of small bowel and a dilated segment of the descending colon. There is less distended small bowel in this hernia than on the prior study. There is a ostomy in the right mid abdomen which appears unchanged. Vascular/Lymphatic: There are several stable left periaortic lymph nodes as well as several small lymph nodes in the mesentery in the right mid abdomen. These are probably reactive in origin. Reproductive: Prostate  is unremarkable. Other: No free air or free fluid. Musculoskeletal: No acute bone abnormality. Multiple sclerotic lesions in the bones consistent with metastatic prostate cancer, unchanged. IMPRESSION: 1. Increased distention of numerous small bowel loops with some nondistended small bowel loops. This could represent a partial or early complete small bowel obstruction. 2. Chronic dilatation of the pancreatic duct. The duct is more prominent than on the prior study of 12/02/2018. Electronically Signed   By: Lorriane Shire M.D.   On: 12/29/2018 14:55   Dg Abd Portable 1v-small Bowel Obstruction Protocol-initial, 8 Hr Delay  Result Date: 12/30/2018 CLINICAL DATA:  Small bowel obstruction. EXAM: PORTABLE ABDOMEN - 1 VIEW COMPARISON:  Radiographs of November 14, 2018. CT scan of December 29, 2018. FINDINGS: The bowel gas pattern is normal. Distal tip of nasogastric tube is seen in proximal stomach. Status post prior hernia repair in right lower quadrant. No radio-opaque calculi or other significant radiographic abnormality are seen. IMPRESSION: No evidence of bowel obstruction or ileus. Electronically Signed   By: Marijo Conception, M.D.   On: 12/30/2018 09:34     Scheduled Meds: . diatrizoate meglumine-sodium  90 mL Per NG tube Once  . enoxaparin (LOVENOX) injection  30 mg Subcutaneous Q24H  . enzalutamide  160 mg Oral Daily  . ferrous sulfate  325 mg Oral Q breakfast  . mirtazapine  15 mg Oral QHS   Continuous Infusions:   LOS: 1 day   Time Spent in minutes   30 minutes  Daniel Howard D.O. on 12/30/2018 at 10:10 AM  Between 7am to 7pm - Please see pager noted on [email or web address]  After 7pm go to [email or web address]  And look for the night coverage person covering for me after hours  [facility] Office  [phone]

## 2018-12-31 LAB — HEMOGLOBIN AND HEMATOCRIT, BLOOD
HCT: 37.7 % — ABNORMAL LOW (ref 39.0–52.0)
Hemoglobin: 12.3 g/dL — ABNORMAL LOW (ref 13.0–17.0)

## 2018-12-31 LAB — OCCULT BLOOD GASTRIC / DUODENUM (SPECIMEN CUP): Occult Blood, Gastric: POSITIVE — AB

## 2018-12-31 NOTE — Progress Notes (Signed)
PROGRESS NOTE    Daniel Howard  GDJ:[id number] DOB: [birth year] DOA: 12/29/2018 PCP: Alycia Rossetti, MD   Brief Narrative:  HPI on 12/29/2018 by Dr. Barb Merino Daniel Howard is a 63 y.o. male with medical history significant of complicated Crohn's disease status post multiple surgeries including partial colectomy with ostomy in place, recurrent partial small bowel obstructions, especially in the hernia, chronic abdomen pain as well as metastatic prostate cancer.  Patient had multiple hospitalizations in the past.  He presents to the hospital with 1 day history of abdominal pain.  According to the patient, started last night, mostly periumbilical, dull aching, 6 out of 10 at its worse, eating makes it worse.  Sudden onset.  Relieved with morphine in the ER.  No fever or chills. Pertinent negative, no nausea or vomiting.  Appetite poor due to pain.  He has adequate output in his ostomy and was full of loose stool in the ER.  He is on chronic opiates.  Denies any other symptoms.  Has been followed by his GI surgery and been evaluated for hernia repairs. Denies any fever chills.  Denies any headache nausea vomiting.  Denies any chest pain or shortness of breath or wheezing.  Urine habit is normal.  Denies any focal weakness.  Interim history Admitted with abdominal pain secondary to partial small bowel obstruction.  Will order small bowel protocol and continue to monitor. Assessment & Plan   Abdominal pain secondary to partial small bowel obstruction -Patient has had a significant history of Crohn's disease including several abdominal surgeries -Currently no active nausea or vomiting and feels that his abdominal pain has improved.  Still has some abdominal pain in the right lower quadrant. -CT and pelvis showed increased distention of numerous small bowel loops with some nondistended small bowel loops.  Could represent partial or early complete small bowel obstruction.  Dilatation of the  pancreatic duct. -Small bowel protocol however upon reviewing x-ray, no contrast seen -For now, will continue conservative management with IV fluids, antiemetics, pain control -Patient does have NG tube in place and feeling better -When able, will place him on clear liquid diet -Patient noted to have positive Gastroccult, suspect this is likely due to trauma with NG tube insertion (hemoglobin currently stable, 12.3)  Crohn's disease -Patient has had frequent exacerbations and recently completed steroid therapy -[facility]  Prostate cancer -Patient follows with Dr. Alen Blew, oncology -Cotninue Xtandi  DVT Prophylaxis  lovenox  Code Status: Full  Family Communication: None at bedside  Disposition Plan: Admitted. Pending further improvement in abdominal pain.  Suspect discharged to home when stable.  Consultants None  Procedures  None  Antibiotics   Anti-infectives (From admission, onward)   None      Subjective:   [name] seen and examined today.  Feels his abdominal pain has improved.  Denies current nausea or vomiting.  Not sure he wants to start drinking fluids at this time.  Denies current chest pain, shortness of breath, dizziness or headache.  Objective:   Vitals:   12/30/18 1314 12/30/18 2026 12/31/18 0418 12/31/18 1251  BP: 110/74 120/83 109/78 119/80  Pulse: 97 (!) 104 98 (!) 102  Resp:  16 16 17   Temp: 97.8 F (36.6 C) 98.9 F (37.2 C) 98.2 F (36.8 C) (!) 97.3 F (36.3 C)  TempSrc: Oral Oral Oral Oral  SpO2: 97% 100% 99% 100%  Weight:      Height:  Intake/Output Summary (Last 24 hours) at 12/31/2018 1422 Last data filed at 12/31/2018 1000 Gross per 24 hour  Intake 1324.51 ml  Output 850 ml  Net 474.51 ml   Filed Weights   12/29/18 2241 12/30/18 0337  Weight: 61.5 kg 61.5 kg   Exam  General: Well developed, chronically ill-appearing, NAD  HEENT: NCAT, mucous membranes moist. NG  tube in place  Neck: Supple  Cardiovascular: S1 S2 auscultated, RRR, no murmur  Respiratory: Clear to auscultation bilaterally with equal chest rise  Abdomen: Soft, mildly TTP, nondistended, + bowel sounds, +ostomy with loose stool  Extremities: warm dry without cyanosis clubbing or edema  Neuro: AAOx3, nonfocal  Psych: Normal affect and demeanor  Data Reviewed: I have personally reviewed following labs and imaging studies  CBC: Recent Labs  Lab 12/29/18 1121 12/30/18 0310 12/31/18 0859  WBC 7.0 7.8  --   HGB 13.4 11.5* 12.3*  HCT 41.5 36.3* 37.7*  MCV 97.6 96.0  --   PLT 453* 392  --    Basic Metabolic Panel: Recent Labs  Lab 12/29/18 1121 12/30/18 0310  NA 137 136  K 4.0 3.9  CL 100 101  CO2 28 27  GLUCOSE 119* 97  BUN 9 9  CREATININE 1.09 1.18  CALCIUM 9.3 8.1*  MG  --  1.8  PHOS  --  2.8   GFR: Estimated Creatinine Clearance: 56.9 mL/min (by C-G formula based on SCr of 1.18 mg/dL). Liver Function Tests: Recent Labs  Lab 12/29/18 1121  AST 26  ALT 18  ALKPHOS 55  BILITOT 0.8  PROT 8.0  ALBUMIN 3.4*   Recent Labs  Lab 12/29/18 1121  LIPASE 22   No results for input(s): AMMONIA in the last 168 hours. Coagulation Profile: No results for input(s): INR, PROTIME in the last 168 hours. Cardiac Enzymes: No results for input(s): CKTOTAL, CKMB, CKMBINDEX, TROPONINI in the last 168 hours. BNP (last 3 results) No results for input(s): PROBNP in the last 8760 hours. HbA1C: No results for input(s): HGBA1C in the last 72 hours. CBG: No results for input(s): GLUCAP in the last 168 hours. Lipid Profile: No results for input(s): CHOL, HDL, LDLCALC, TRIG, CHOLHDL, LDLDIRECT in the last 72 hours. Thyroid Function Tests: No results for input(s): TSH, T4TOTAL, FREET4, T3FREE, THYROIDAB in the last 72 hours. Anemia Panel: No results for input(s): VITAMINB12, FOLATE, FERRITIN, TIBC, IRON, RETICCTPCT in the last 72 hours. Urine analysis:    Component Value  Date/Time   COLORURINE YELLOW 12/29/2018 1114   APPEARANCEUR CLEAR 12/29/2018 1114   LABSPEC >1.046 (H) 12/29/2018 1114   PHURINE 6.0 12/29/2018 1114   GLUCOSEU NEGATIVE 12/29/2018 1114   HGBUR NEGATIVE 12/29/2018 1114   BILIRUBINUR NEGATIVE 12/29/2018 1114   KETONESUR NEGATIVE 12/29/2018 1114   PROTEINUR NEGATIVE 12/29/2018 1114   UROBILINOGEN 0.2 10/10/2014 1037   NITRITE NEGATIVE 12/29/2018 1114   LEUKOCYTESUR NEGATIVE 12/29/2018 1114   Sepsis Labs: @LABRCNTIP (procalcitonin:4,lacticidven:4)  )No results found for this or any previous visit (from the past 240 hour(s)).    Radiology Studies: Dg Abd 1 View  Result Date: 12/30/2018 CLINICAL DATA:  Small bowel obstruction protocol EXAM: ABDOMEN - 1 VIEW COMPARISON:  Is 12/30/2018 FINDINGS: Stable NG tube within the fundus of the stomach. Gas-filled loops of small bowel project over the central abdomen. There is also gas in the colon. There is no disproportionate dilatation of bowel. There is no obvious free intraperitoneal gas. Excreted contrast in the bladder is noted. IMPRESSION: Nonobstructive bowel gas pattern. Electronically Signed  By: Marybelle Killings M.D.   On: 12/30/2018 19:21   Ct Abdomen Pelvis W Contrast  Result Date: 12/29/2018 CLINICAL DATA:  Acute abdominal pain. Crohn's disease. Prostate cancer. EXAM: CT ABDOMEN AND PELVIS WITH CONTRAST TECHNIQUE: Multidetector CT imaging of the abdomen and pelvis was performed using the standard protocol following bolus administration of intravenous contrast. CONTRAST:  156m OMNIPAQUE IOHEXOL 300 MG/ML  SOLN COMPARISON:  CT scan dated 12/02/2018 FINDINGS: Lower chest: No acute abnormality. Hepatobiliary: Liver parenchyma is normal. Gallbladder is slightly distended. Biliary tree is slightly distended chronically. No discrete stones or gallbladder wall thickening. Pancreas: Chronic dilatation of the pancreatic duct. However, the duct is more prominent than on the prior study of 12/02/2018, now  measuring 6 mm in diameter. The duct is dilated to the level of the ampulla. No discrete masses of the pancreas. Spleen: Normal in size. Small enhancing lesions in the spleen consistent with benign hemangiomas as previously described. Adrenals/Urinary Tract: Adrenal glands are normal. Stable tiny cysts in each kidney. No hydronephrosis. Stable stone in the left posterior aspect of the bladder. Bladder is otherwise normal. Stomach/Bowel: There is increased distention of numerous small bowel loops but there are some nondistended bowel loops. The stomach is not distended. There is a large chronic left mid abdominal wall hernia at the site of an ostomy for the descending colon containing nondilated loops of small bowel and a dilated segment of the descending colon. There is less distended small bowel in this hernia than on the prior study. There is a ostomy in the right mid abdomen which appears unchanged. Vascular/Lymphatic: There are several stable left periaortic lymph nodes as well as several small lymph nodes in the mesentery in the right mid abdomen. These are probably reactive in origin. Reproductive: Prostate is unremarkable. Other: No free air or free fluid. Musculoskeletal: No acute bone abnormality. Multiple sclerotic lesions in the bones consistent with metastatic prostate cancer, unchanged. IMPRESSION: 1. Increased distention of numerous small bowel loops with some nondistended small bowel loops. This could represent a partial or early complete small bowel obstruction. 2. Chronic dilatation of the pancreatic duct. The duct is more prominent than on the prior study of 12/02/2018. Electronically Signed   By: JLorriane ShireM.D.   On: 12/29/2018 14:55   Dg Abd Portable 1v-small Bowel Obstruction Protocol-initial, 8 Hr Delay  Result Date: 12/30/2018 CLINICAL DATA:  Small bowel obstruction. EXAM: PORTABLE ABDOMEN - 1 VIEW COMPARISON:  Radiographs of November 14, 2018. CT scan of December 29, 2018. FINDINGS: The  bowel gas pattern is normal. Distal tip of nasogastric tube is seen in proximal stomach. Status post prior hernia repair in right lower quadrant. No radio-opaque calculi or other significant radiographic abnormality are seen. IMPRESSION: No evidence of bowel obstruction or ileus. Electronically Signed   By: JMarijo Conception M.D.   On: 12/30/2018 09:34     Scheduled Meds: . enoxaparin (LOVENOX) injection  40 mg Subcutaneous QHS  . enzalutamide  160 mg Oral Daily  . ferrous sulfate  325 mg Oral Q breakfast  . mirtazapine  15 mg Oral QHS   Continuous Infusions: . sodium chloride 75 mL/hr at 12/31/18 0323     LOS: 2 days   Time Spent in minutes   30 minutes  Thor Nannini D.O. on 12/31/2018 at 2:22 PM  Between 7am to 7pm - Please see pager noted on [email or web address]  After 7pm go to [email or web address]  And look for the night coverage person covering for me after hours  [place]  [phone]

## 2018-12-31 NOTE — Care Management Important Message (Signed)
Important Message  Patient Details  Name: Daniel Howard MRN: [id number] Date of Birth: 09-23-1956   Medicare Important Message Given:  Yes    [name] 12/31/2018, 3:51 PM

## 2018-12-31 NOTE — Progress Notes (Signed)
Pt c/o NGT making throat hurt, pt has had NGT clamped since 1015 this am, pt has had ice chips/sips water, no nausea, no vomiting, no abdominal distention, pt was nurse to call MD to request tube be removed.  Dr Ree Kida notified and order received to d/c NGT and start clear liquid diet.  NGT removed at this time without problem, pt wanted ice chips to eat at this time.

## 2018-12-31 NOTE — Consult Note (Signed)
   [facility] CM Inpatient Consult   12/31/2018  Daniel Howard August 02, 1956 [id number]    Patient screened for potential [facility] Care Management services due to unplanned readmission risk score of 37% (extreme) and multiple hospitalizations.  Chart reviewed. No current [facility] Care Management needs at this time. Will continue to follow along and engage for potential [facility] Care Management services if appropriate.  Discussed with inpatient RNCM.  Marthenia Rolling, MSN-Ed, RN,BSN [facility] Liaison [phone]

## 2019-01-01 MED ORDER — PANTOPRAZOLE SODIUM 40 MG IV SOLR
40.0000 mg | Freq: Two times a day (BID) | INTRAVENOUS | Status: DC
  Administered 2019-01-01 – 2019-01-02 (×3): 40 mg via INTRAVENOUS
  Filled 2019-01-01 (×3): qty 40

## 2019-01-01 NOTE — Progress Notes (Signed)
PROGRESS NOTE    Daniel Howard  TUU:[id number] DOB: 08-29-1956 DOA: 12/29/2018 PCP: Alycia Rossetti, MD   Brief Narrative:  HPI on 12/29/2018 by Dr. Barb Merino Daniel Howard is a 63 y.o. male with medical history significant of complicated Crohn's disease status post multiple surgeries including partial colectomy with ostomy in place, recurrent partial small bowel obstructions, especially in the hernia, chronic abdomen pain as well as metastatic prostate cancer.  Patient had multiple hospitalizations in the past.  He presents to the hospital with 1 day history of abdominal pain.  According to the patient, started last night, mostly periumbilical, dull aching, 6 out of 10 at its worse, eating makes it worse.  Sudden onset.  Relieved with morphine in the ER.  No fever or chills. Pertinent negative, no nausea or vomiting.  Appetite poor due to pain.  He has adequate output in his ostomy and was full of loose stool in the ER.  He is on chronic opiates.  Denies any other symptoms.  Has been followed by his GI surgery and been evaluated for hernia repairs. Denies any fever chills.  Denies any headache nausea vomiting.  Denies any chest pain or shortness of breath or wheezing.  Urine habit is normal.  Denies any focal weakness.  Interim history Admitted with abdominal pain secondary to partial small bowel obstruction.  Will order small bowel protocol and continue to monitor. Assessment & Plan   Abdominal pain secondary to partial small bowel obstruction -Patient has had a significant history of Crohn's disease including several abdominal surgeries -Currently no active nausea or vomiting and feels that his abdominal pain has improved.  Still has some abdominal pain in the right lower quadrant. -CT and pelvis showed increased distention of numerous small bowel loops with some nondistended small bowel loops.  Could represent partial or early complete small bowel obstruction.  Dilatation of the  pancreatic duct. -Small bowel protocol however upon reviewing x-ray, no contrast seen -For now, will continue conservative management with IV fluids, antiemetics, pain control -NG tube removed, no N/V -Patient noted to have positive Gastroccult, suspect this is likely due to trauma with NG tube insertion (hemoglobin currently stable, 12.3) -able to tolerate clears, will advance to full liquids -continue IVF until able to tolerate adequate intake orally  Crohn's disease -Patient has had frequent exacerbations and recently completed steroid therapy -[facility]  Prostate cancer -Patient follows with Dr. Alen Blew, oncology -Cotninue Xtandi  DVT Prophylaxis  lovenox  Code Status: Full  Family Communication: None at bedside  Disposition Plan: Admitted. Pending further improvement in abdominal pain.  Suspect discharged to home when stable.   Consultants None  Procedures  None  Antibiotics   Anti-infectives (From admission, onward)   None      Subjective:   Daniel Howard seen and examined today.  Feels abdominal pain is improved.  Denies any current nausea or vomiting.  Unsure if he wants to try full liquids today.  States he did okay with clear liquids and ice chips yesterday.  Currently denies any chest pain or shortness of breath, dizziness or headache.  Objective:   Vitals:   12/31/18 0418 12/31/18 1251 12/31/18 2030 01/01/19 0539  BP: 109/78 119/80 109/75 101/70  Pulse: 98 (!) 102 100 91  Resp: 16 17 18 16   Temp: 98.2 F (36.8 C) (!) 97.3 F (36.3 C) 98.3 F (36.8 C) 98.4 F (36.9 C)  TempSrc: Oral Oral  Oral  SpO2: 99%  100% 94% 99%  Weight:      Height:        Intake/Output Summary (Last 24 hours) at 01/01/2019 1247 Last data filed at 01/01/2019 0524 Gross per 24 hour  Intake 1974.67 ml  Output -  Net 1974.67 ml   Filed Weights   12/29/18 2241 12/30/18 0337  Weight: 61.5 kg 61.5 kg  Exam  General: Well  developed, chronically ill-appearing, NAD  HEENT: NCAT,mucous membranes moist.   Neck: Supple  Cardiovascular: S1 S2 auscultated, no rubs, murmurs or gallops. Regular rate and rhythm.  Respiratory: Clear to auscultation bilaterally with equal chest rise  Abdomen: Soft, nontender, nondistended, + bowel sounds, healed abdominal scarring, positive ostomy with loose stool  Extremities: warm dry without cyanosis clubbing or edema  Neuro: AAOx3, nonfocal  Skin: Without rashes exudates or nodules  Psych: Appropriate mood and affect  Data Reviewed: I have personally reviewed following labs and imaging studies  CBC: Recent Labs  Lab 12/29/18 1121 12/30/18 0310 12/31/18 0859  WBC 7.0 7.8  --   HGB 13.4 11.5* 12.3*  HCT 41.5 36.3* 37.7*  MCV 97.6 96.0  --   PLT 453* 392  --    Basic Metabolic Panel: Recent Labs  Lab 12/29/18 1121 12/30/18 0310  NA 137 136  K 4.0 3.9  CL 100 101  CO2 28 27  GLUCOSE 119* 97  BUN 9 9  CREATININE 1.09 1.18  CALCIUM 9.3 8.1*  MG  --  1.8  PHOS  --  2.8   GFR: Estimated Creatinine Clearance: 56.9 mL/min (by C-G formula based on SCr of 1.18 mg/dL). Liver Function Tests: Recent Labs  Lab 12/29/18 1121  AST 26  ALT 18  ALKPHOS 55  BILITOT 0.8  PROT 8.0  ALBUMIN 3.4*   Recent Labs  Lab 12/29/18 1121  LIPASE 22   No results for input(s): AMMONIA in the last 168 hours. Coagulation Profile: No results for input(s): INR, PROTIME in the last 168 hours. Cardiac Enzymes: No results for input(s): CKTOTAL, CKMB, CKMBINDEX, TROPONINI in the last 168 hours. BNP (last 3 results) No results for input(s): PROBNP in the last 8760 hours. HbA1C: No results for input(s): HGBA1C in the last 72 hours. CBG: No results for input(s): GLUCAP in the last 168 hours. Lipid Profile: No results for input(s): CHOL, HDL, LDLCALC, TRIG, CHOLHDL, LDLDIRECT in the last 72 hours. Thyroid Function Tests: No results for input(s): TSH, T4TOTAL, FREET4, T3FREE,  THYROIDAB in the last 72 hours. Anemia Panel: No results for input(s): VITAMINB12, FOLATE, FERRITIN, TIBC, IRON, RETICCTPCT in the last 72 hours. Urine analysis:    Component Value Date/Time   COLORURINE YELLOW 12/29/2018 1114   APPEARANCEUR CLEAR 12/29/2018 1114   LABSPEC >1.046 (H) 12/29/2018 1114   PHURINE 6.0 12/29/2018 1114   GLUCOSEU NEGATIVE 12/29/2018 1114   HGBUR NEGATIVE 12/29/2018 1114   BILIRUBINUR NEGATIVE 12/29/2018 1114   KETONESUR NEGATIVE 12/29/2018 1114   PROTEINUR NEGATIVE 12/29/2018 1114   UROBILINOGEN 0.2 10/10/2014 1037   NITRITE NEGATIVE 12/29/2018 1114   LEUKOCYTESUR NEGATIVE 12/29/2018 1114   Sepsis Labs: @LABRCNTIP (procalcitonin:4,lacticidven:4)  )No results found for this or any previous visit (from the past 240 hour(s)).    Radiology Studies: Dg Abd 1 View  Result Date: 12/30/2018 CLINICAL DATA:  Small bowel obstruction protocol EXAM: ABDOMEN - 1 VIEW COMPARISON:  Is 12/30/2018 FINDINGS: Stable NG tube within the fundus of the stomach. Gas-filled loops of small bowel project over the central abdomen. There is also gas in the colon.  There is no disproportionate dilatation of bowel. There is no obvious free intraperitoneal gas. Excreted contrast in the bladder is noted. IMPRESSION: Nonobstructive bowel gas pattern. Electronically Signed   By: Marybelle Killings M.D.   On: 12/30/2018 19:21     Scheduled Meds: . enoxaparin (LOVENOX) injection  40 mg Subcutaneous QHS  . enzalutamide  160 mg Oral Daily  . ferrous sulfate  325 mg Oral Q breakfast  . mirtazapine  15 mg Oral QHS  . pantoprazole (PROTONIX) IV  40 mg Intravenous Q12H   Continuous Infusions: . sodium chloride 75 mL/hr at 12/31/18 2023     LOS: 3 days   Time Spent in minutes   30 minutes  Pluma Diniz D.O. on 01/01/2019 at 12:47 PM  Between 7am to 7pm - Please see pager noted on [email or web address]  After 7pm go to [email or web address]  And look for the night coverage person covering for me after  hours  [facility] Office  [phone]

## 2019-01-02 MED ORDER — PANTOPRAZOLE SODIUM 40 MG PO TBEC: 40.0000 mg | DELAYED_RELEASE_TABLET | Freq: Two times a day (BID) | ORAL | Status: DC

## 2019-01-02 NOTE — Plan of Care (Signed)

## 2019-01-02 NOTE — Discharge Summary (Signed)
Physician Discharge Summary  Daniel Howard MSX:[id number] DOB: 1956/10/18 DOA: 12/29/2018  PCP: Alycia Rossetti, MD  Admit date: 12/29/2018 Discharge date: 01/02/2019  Time spent: 45 minutes  Recommendations for Outpatient Follow-up:  Patient will be discharged to home.  Patient will need to follow up with primary care provider within one week of discharge.  Follow up with gastroenterology. Patient should continue medications as prescribed.  Patient should follow a soft diet.   Discharge Diagnoses:  Principal Problem:   Partial small bowel obstruction (HCC) Active Problems:   Crohn's disease of both small and large intestine with complication ([place])   Colostomy status ([place])   Small bowel obstruction due to adhesions [facility])   Discharge Condition: Stable  Diet recommendation: soft  Filed Weights   12/29/18 2241 12/30/18 0337  Weight: 61.5 kg 61.5 kg    History of present illness:  on 12/29/2018 by Dr. Hale Bogus [name] a 63 y.o.malewith medical history significant ofcomplicated Crohn's disease status post multiple surgeries including partial colectomy with ostomy in place, recurrent partial small bowel obstructions, especially in the hernia, chronic abdomen pain as well as metastatic prostate cancer. Patient had multiple hospitalizations in the past. He presents to the hospital with 1 day history of abdominal pain. According to the patient, started last night, mostly periumbilical, dull aching, 6 out of 10 at its worse, eating makes it worse. Sudden onset. Relieved with morphine in the ER. No fever or chills. Pertinent negative, no nausea or vomiting. Appetite poor due to pain. He has adequate output in his ostomy and was full of loose stool in the ER. He is on chronic opiates. Denies any other symptoms. Has been followed by his GI surgery and been evaluated for hernia repairs. Denies any fever chills. Denies any headache nausea vomiting. Denies any chest  pain or shortness of breath or wheezing. Urine habit is normal. Denies any focal weakness.  Hospital Course:  Abdominal pain secondary to partial small bowel obstruction -Patient has had a significant history of Crohn's disease including several abdominal surgeries -Currently no active nausea or vomiting and feels that his abdominal pain has improved.  Still has some abdominal pain in the right lower quadrant. -CT and pelvis showed increased distention of numerous small bowel loops with some nondistended small bowel loops.  Could represent partial or early complete small bowel obstruction.  Dilatation of the pancreatic duct. -Small bowel protocol however upon reviewing x-ray, no contrast seen -For now, will continue conservative management with IV fluids, antiemetics, pain control -NG tube removed, no N/V -Patient noted to have positive Gastroccult, suspect this is likely due to trauma with NG tube insertion (hemoglobin currently stable, 12.3) -able to soft diet  Crohn's disease -Patient has had frequent exacerbations and recently completed steroid therapy -[facility]  Prostate cancer -Patient follows with Dr. Alen Blew, oncology -Cotninue Xtandi  Procedures: None  Consultations: None  Discharge Exam: Vitals:   01/01/19 2136 01/02/19 0500  BP: 119/85 104/74  Pulse: 92 78  Resp: 16 15  Temp: 98.2 F (36.8 C) 97.7 F (36.5 C)  SpO2: 99% 99%   Is he is feeling much better today.,  No further abdominal pain, nausea or vomiting.  Denies current chest pain, shortness of breath, dizziness or headache.  Would like to go home.   General: Well developed, chronically ill-appearing, NAD  HEENT: NCAT, mucous membranes moist.  Neck: Supple  Cardiovascular: S1 S2 auscultated, no murmur, RRR  Respiratory: Clear to auscultation  bilaterally with equal chest rise  Abdomen: Soft, nontender, nondistended, + bowel sounds, healed  abdominal scarring, +ostomy  Extremities: warm dry without cyanosis clubbing or edema  Neuro: AAOx3, nonfocal  Psych: Pleasant, appropriate mood and affect  Discharge Instructions Discharge Instructions    Discharge instructions   Complete by:  As directed    Patient will be discharged to home.  Patient will need to follow up with primary care provider within one week of discharge.  Follow up with gastroenterology. Patient should continue medications as prescribed.  Patient should follow a soft diet.     Allergies as of 01/02/2019      Reactions   Penicillins Hives   Has patient had a PCN reaction causing immediate rash, facial/tongue/throat swelling, SOB or lightheadedness with hypotension: Yes Has patient had a PCN reaction causing severe rash involving mucus membranes or skin necrosis: No Has patient had a PCN reaction that required hospitalization: No Has patient had a PCN reaction occurring within the last 10 years: No If all of the above answers are "NO", then may proceed with Cephalosporin use.      Medication List    TAKE these medications   Adalimumab 40 MG/0.8ML Pnkt Commonly known as:  HUMIRA PEN Inject 1 Syringe into the skin once a week. What changed:  when to take this   budesonide 3 MG 24 hr capsule Commonly known as:  ENTOCORT EC Take 3 capsules (9 mg total) by mouth daily. What changed:  how much to take   calcium carbonate 600 MG Tabs tablet Commonly known as:  OS-CAL Take 1,200 mg by mouth daily with breakfast.   CENTRUM SILVER ADULT 50+ Tabs Take 1 tablet by mouth daily.   ferrous sulfate 325 (65 FE) MG tablet Take 325 mg by mouth daily with breakfast.   HYDROcodone-acetaminophen 10-325 MG tablet Commonly known as:  NORCO Take 1 tablet by mouth every 8 (eight) hours as needed. What changed:  reasons to take this   lidocaine-prilocaine cream Commonly known as:  EMLA Apply 1 application topically as needed (when accessing port).   mirtazapine  15 MG tablet Commonly known as:  REMERON TAKE 1 TABLET BY MOUTH AT BEDTIME   XTANDI 40 MG capsule Generic drug:  enzalutamide TAKE 4 CAPSULES (160 MG TOTAL) BY MOUTH DAILY. What changed:  See the new instructions.      Allergies  Allergen Reactions  . Penicillins Hives    Has patient had a PCN reaction causing immediate rash, facial/tongue/throat swelling, SOB or lightheadedness with hypotension: Yes Has patient had a PCN reaction causing severe rash involving mucus membranes or skin necrosis: No Has patient had a PCN reaction that required hospitalization: No Has patient had a PCN reaction occurring within the last 10 years: No If all of the above answers are "NO", then may proceed with Cephalosporin use.    Follow-up Information    [place], [name], MD.   Specialty:  Family Medicine Contact information: [place] [address] [place] [address] [phone]            The results of significant diagnostics from this hospitalization (including imaging, microbiology, ancillary and laboratory) are listed below for reference.    Significant Diagnostic Studies: Dg Abd 1 View  Result Date: 12/30/2018 CLINICAL DATA:  Small bowel obstruction protocol EXAM: ABDOMEN - 1 VIEW COMPARISON:  Is 12/30/2018 FINDINGS: Stable NG tube within the fundus of the stomach. Gas-filled loops of small bowel project over the central abdomen. There is  also gas in the colon. There is no disproportionate dilatation of bowel. There is no obvious free intraperitoneal gas. Excreted contrast in the bladder is noted. IMPRESSION: Nonobstructive bowel gas pattern. Electronically Signed   By: Marybelle Killings M.D.   On: 12/30/2018 19:21   Ct Abdomen Pelvis W Contrast  Result Date: 12/29/2018 CLINICAL DATA:  Acute abdominal pain. Crohn's disease. Prostate cancer. EXAM: CT ABDOMEN AND PELVIS WITH CONTRAST TECHNIQUE: Multidetector CT imaging of the abdomen and pelvis was performed using the standard protocol  following bolus administration of intravenous contrast. CONTRAST:  169m OMNIPAQUE IOHEXOL 300 MG/ML  SOLN COMPARISON:  CT scan dated 12/02/2018 FINDINGS: Lower chest: No acute abnormality. Hepatobiliary: Liver parenchyma is normal. Gallbladder is slightly distended. Biliary tree is slightly distended chronically. No discrete stones or gallbladder wall thickening. Pancreas: Chronic dilatation of the pancreatic duct. However, the duct is more prominent than on the prior study of 12/02/2018, now measuring 6 mm in diameter. The duct is dilated to the level of the ampulla. No discrete masses of the pancreas. Spleen: Normal in size. Small enhancing lesions in the spleen consistent with benign hemangiomas as previously described. Adrenals/Urinary Tract: Adrenal glands are normal. Stable tiny cysts in each kidney. No hydronephrosis. Stable stone in the left posterior aspect of the bladder. Bladder is otherwise normal. Stomach/Bowel: There is increased distention of numerous small bowel loops but there are some nondistended bowel loops. The stomach is not distended. There is a large chronic left mid abdominal wall hernia at the site of an ostomy for the descending colon containing nondilated loops of small bowel and a dilated segment of the descending colon. There is less distended small bowel in this hernia than on the prior study. There is a ostomy in the right mid abdomen which appears unchanged. Vascular/Lymphatic: There are several stable left periaortic lymph nodes as well as several small lymph nodes in the mesentery in the right mid abdomen. These are probably reactive in origin. Reproductive: Prostate is unremarkable. Other: No free air or free fluid. Musculoskeletal: No acute bone abnormality. Multiple sclerotic lesions in the bones consistent with metastatic prostate cancer, unchanged. IMPRESSION: 1. Increased distention of numerous small bowel loops with some nondistended small bowel loops. This could represent  a partial or early complete small bowel obstruction. 2. Chronic dilatation of the pancreatic duct. The duct is more prominent than on the prior study of 12/02/2018. Electronically Signed   By: JLorriane ShireM.D.   On: 12/29/2018 14:55   Dg Abd Portable 1v-small Bowel Obstruction Protocol-initial, 8 Hr Delay  Result Date: 12/30/2018 CLINICAL DATA:  Small bowel obstruction. EXAM: PORTABLE ABDOMEN - 1 VIEW COMPARISON:  Radiographs of November 14, 2018. CT scan of December 29, 2018. FINDINGS: The bowel gas pattern is normal. Distal tip of nasogastric tube is seen in proximal stomach. Status post prior hernia repair in right lower quadrant. No radio-opaque calculi or other significant radiographic abnormality are seen. IMPRESSION: No evidence of bowel obstruction or ileus. Electronically Signed   By: JMarijo Conception M.D.   On: 12/30/2018 09:34    Microbiology: No results found for this or any previous visit (from the past 240 hour(s)).   Labs: Basic Metabolic Panel: Recent Labs  Lab 12/29/18 1121 12/30/18 0310  NA 137 136  K 4.0 3.9  CL 100 101  CO2 28 27  GLUCOSE 119* 97  BUN 9 9  CREATININE 1.09 1.18  CALCIUM 9.3 8.1*  MG  --  1.8  PHOS  --  2.8   Liver Function Tests: Recent Labs  Lab 12/29/18 1121  AST 26  ALT 18  ALKPHOS 55  BILITOT 0.8  PROT 8.0  ALBUMIN 3.4*   Recent Labs  Lab 12/29/18 1121  LIPASE 22   No results for input(s): AMMONIA in the last 168 hours. CBC: Recent Labs  Lab 12/29/18 1121 12/30/18 0310 12/31/18 0859  WBC 7.0 7.8  --   HGB 13.4 11.5* 12.3*  HCT 41.5 36.3* 37.7*  MCV 97.6 96.0  --   PLT 453* 392  --    Cardiac Enzymes: No results for input(s): CKTOTAL, CKMB, CKMBINDEX, TROPONINI in the last 168 hours. BNP: BNP (last 3 results) Recent Labs    06/27/18 0532  BNP 24.5    ProBNP (last 3 results) No results for input(s): PROBNP in the last 8760 hours.  CBG: No results for input(s): GLUCAP in the last 168  hours.     Signed:  Cristal Ford  [facility] 01/02/2019, 12:46 PM

## 2019-01-02 NOTE — Discharge Instructions (Signed)
Bowel Obstruction A bowel obstruction means that something is blocking the small or large bowel. The bowel is also called the intestine. It is the long tube that connects the stomach to the opening of the butt (anus). When something blocks the bowel, food and fluids cannot pass through like normal. This condition needs to be treated. Treatment depends on the cause of the problem and how bad the problem is. What are the causes? Common causes of this condition include:  Scar tissue (adhesions) from past surgery or from high-energy X-rays (radiation).  Recent surgery in the belly. This affects how food moves in the bowel.  Some diseases, such as: ? Irritation of the lining of the digestive tract (Crohn's disease). ? Irritation of small pouches in the bowel (diverticulitis).  Growths or tumors.  A bulging organ (hernia).  Twisting of the bowel (volvulus).  A foreign body.  Slipping of a part of the bowel into another part (intussusception). What are the signs or symptoms? Symptoms of this condition include:  Pain in the belly.  Feeling sick to your stomach (nauseous).  Throwing up (vomiting).  Bloating in the belly.  Being unable to pass gas.  Trouble pooping (constipation).  Watery poop (diarrhea).  A lot of belching. How is this diagnosed? This condition may be diagnosed based on:  A physical exam.  Medical history.  Imaging tests, such as X-ray or CT scan.  Blood tests.  Urine tests. How is this treated? Treatment for this condition may include:  Fluids and pain medicines that are given through an IV tube. Your doctor may tell you not to eat or drink if you feel sick to your stomach and are throwing up.  Eating a clear liquid diet for a few days.  Putting a small tube (nasogastric tube) into the stomach. This will help with pain, discomfort, and nausea by removing blocked air and fluids from the stomach.  Surgery. This may be needed if other treatments do  not work. Follow these instructions at home: Medicines  Take over-the-counter and prescription medicines only as told by your doctor.  If you were prescribed an antibiotic medicine, take it as told by your doctor. Do not stop taking the antibiotic even if you start to feel better. General instructions  Follow your diet as told by your doctor. You may need to: ? Only drink clear liquids until you start to get better. ? Avoid solid foods.  Return to your normal activities as told by your doctor. Ask your doctor what activities are safe for you.  Do not sit for a long time without moving. Get up to take short walks every 1-2 hours. This is important. Ask for help if you feel weak or unsteady.  Keep all follow-up visits as told by your doctor. This is important. How is this prevented? After having a bowel obstruction, you may be more likely to have another. You can do some things to stop it from happening again.  If you have a long-term (chronic) disease, contact your doctor if you see changes or problems.  Take steps to prevent or treat trouble pooping. Your doctor may ask that you: ? Drink enough fluid to keep your pee (urine) pale yellow. ? Take over-the-counter or prescription medicines. ? Eat foods that are high in fiber. These include beans, whole grains, and fresh fruits and vegetables. ? Limit foods that are high in fat and sugar. These include fried or sweet foods.  Stay active. Ask your doctor which exercises are  safe for you.  Avoid stress.  Eat three small meals and three small snacks each day.  Work with a Publishing rights manager (dietitian) to make a meal plan that works for you.  Do not use any products that contain nicotine or tobacco, such as cigarettes and e-cigarettes. If you need help quitting, ask your doctor. Contact a doctor if:  You have a fever.  You have chills. Get help right away if:  You have pain or cramps that get worse.  You throw up blood.  You are  sick to your stomach.  You cannot stop throwing up.  You cannot drink fluids.  You feel mixed up (confused).  You feel very thirsty (dehydrated).  Your belly gets more bloated.  You feel weak or you pass out (faint). Summary  A bowel obstruction means that something is blocking the small or large bowel.  Treatment may include IV fluids and pain medicine. You may also have a clear liquid diet, a small tube in your stomach, or surgery.  Drink clear liquids and avoid solid foods until you get better. This information is not intended to replace advice given to you by your health care provider. Make sure you discuss any questions you have with your health care provider. Document Released: 01/15/2005 Document Revised: 04/21/2018 Document Reviewed: 04/21/2018 Elsevier Interactive Patient Education  2019 [employer].

## 2019-01-04 ENCOUNTER — Other Ambulatory Visit: Payer: Self-pay | Admitting: Internal Medicine

## 2019-01-04 NOTE — Telephone Encounter (Signed)
Can we find out what dosage he is on? He should be still taking 9 mg.

## 2019-01-05 DIAGNOSIS — C7951 Secondary malignant neoplasm of bone: Secondary | ICD-10-CM | POA: Diagnosis not present

## 2019-01-08 ENCOUNTER — Emergency Department (HOSPITAL_COMMUNITY): Payer: Medicare Other

## 2019-01-08 ENCOUNTER — Encounter (HOSPITAL_COMMUNITY): Payer: Self-pay | Admitting: Radiology

## 2019-01-08 ENCOUNTER — Inpatient Hospital Stay (HOSPITAL_COMMUNITY): Payer: Medicare Other

## 2019-01-08 ENCOUNTER — Inpatient Hospital Stay (HOSPITAL_COMMUNITY)
Admission: EM | Admit: 2019-01-08 | Discharge: 2019-01-14 | DRG: 394 | Disposition: A | Discharge status: Home / Self Care | Payer: Medicare Other | Attending: Internal Medicine | Admitting: Internal Medicine

## 2019-01-08 DIAGNOSIS — Z4682 Encounter for fitting and adjustment of non-vascular catheter: Secondary | ICD-10-CM | POA: Diagnosis not present

## 2019-01-08 DIAGNOSIS — Z8042 Family history of malignant neoplasm of prostate: Secondary | ICD-10-CM

## 2019-01-08 DIAGNOSIS — C61 Malignant neoplasm of prostate: Secondary | ICD-10-CM | POA: Diagnosis present

## 2019-01-08 DIAGNOSIS — K50819 Crohn's disease of both small and large intestine with unspecified complications: Secondary | ICD-10-CM | POA: Diagnosis not present

## 2019-01-08 DIAGNOSIS — Z932 Ileostomy status: Secondary | ICD-10-CM | POA: Diagnosis not present

## 2019-01-08 DIAGNOSIS — Z8546 Personal history of malignant neoplasm of prostate: Secondary | ICD-10-CM

## 2019-01-08 DIAGNOSIS — E46 Unspecified protein-calorie malnutrition: Secondary | ICD-10-CM | POA: Diagnosis present

## 2019-01-08 DIAGNOSIS — Z87891 Personal history of nicotine dependence: Secondary | ICD-10-CM | POA: Diagnosis not present

## 2019-01-08 DIAGNOSIS — R64 Cachexia: Secondary | ICD-10-CM | POA: Diagnosis not present

## 2019-01-08 DIAGNOSIS — K50019 Crohn's disease of small intestine with unspecified complications: Secondary | ICD-10-CM | POA: Diagnosis not present

## 2019-01-08 DIAGNOSIS — Z9089 Acquired absence of other organs: Secondary | ICD-10-CM

## 2019-01-08 DIAGNOSIS — E43 Unspecified severe protein-calorie malnutrition: Secondary | ICD-10-CM | POA: Diagnosis not present

## 2019-01-08 DIAGNOSIS — Z79899 Other long term (current) drug therapy: Secondary | ICD-10-CM | POA: Diagnosis not present

## 2019-01-08 DIAGNOSIS — Z6821 Body mass index (BMI) 21.0-21.9, adult: Secondary | ICD-10-CM

## 2019-01-08 DIAGNOSIS — Z515 Encounter for palliative care: Secondary | ICD-10-CM | POA: Diagnosis present

## 2019-01-08 DIAGNOSIS — Z800 Family history of malignant neoplasm of digestive organs: Secondary | ICD-10-CM | POA: Diagnosis not present

## 2019-01-08 DIAGNOSIS — K56600 Partial intestinal obstruction, unspecified as to cause: Secondary | ICD-10-CM | POA: Diagnosis not present

## 2019-01-08 DIAGNOSIS — F419 Anxiety disorder, unspecified: Secondary | ICD-10-CM | POA: Diagnosis present

## 2019-01-08 DIAGNOSIS — C7951 Secondary malignant neoplasm of bone: Secondary | ICD-10-CM | POA: Diagnosis present

## 2019-01-08 DIAGNOSIS — Z803 Family history of malignant neoplasm of breast: Secondary | ICD-10-CM | POA: Diagnosis not present

## 2019-01-08 DIAGNOSIS — Z66 Do not resuscitate: Secondary | ICD-10-CM | POA: Diagnosis present

## 2019-01-08 DIAGNOSIS — Z933 Colostomy status: Secondary | ICD-10-CM

## 2019-01-08 DIAGNOSIS — Z7189 Other specified counseling: Secondary | ICD-10-CM | POA: Diagnosis not present

## 2019-01-08 DIAGNOSIS — K460 Unspecified abdominal hernia with obstruction, without gangrene: Principal | ICD-10-CM | POA: Diagnosis present

## 2019-01-08 DIAGNOSIS — K56609 Unspecified intestinal obstruction, unspecified as to partial versus complete obstruction: Secondary | ICD-10-CM | POA: Diagnosis not present

## 2019-01-08 DIAGNOSIS — Z79891 Long term (current) use of opiate analgesic: Secondary | ICD-10-CM

## 2019-01-08 DIAGNOSIS — Z8249 Family history of ischemic heart disease and other diseases of the circulatory system: Secondary | ICD-10-CM

## 2019-01-08 DIAGNOSIS — Z818 Family history of other mental and behavioral disorders: Secondary | ICD-10-CM

## 2019-01-08 DIAGNOSIS — Z825 Family history of asthma and other chronic lower respiratory diseases: Secondary | ICD-10-CM

## 2019-01-08 DIAGNOSIS — R1084 Generalized abdominal pain: Secondary | ICD-10-CM | POA: Diagnosis not present

## 2019-01-08 DIAGNOSIS — K50912 Crohn's disease, unspecified, with intestinal obstruction: Secondary | ICD-10-CM | POA: Diagnosis not present

## 2019-01-08 DIAGNOSIS — K6389 Other specified diseases of intestine: Secondary | ICD-10-CM | POA: Diagnosis not present

## 2019-01-08 LAB — COMPREHENSIVE METABOLIC PANEL
ALBUMIN: 3 g/dL — AB (ref 3.5–5.0)
ALT: 19 U/L (ref 0–44)
AST: 23 U/L (ref 15–41)
Alkaline Phosphatase: 50 U/L (ref 38–126)
Anion gap: 10 (ref 5–15)
BUN: 9 mg/dL (ref 6–20)
CO2: 28 mmol/L (ref 22–32)
Calcium: 8.1 mg/dL — ABNORMAL LOW (ref 8.9–10.3)
Chloride: 101 mmol/L (ref 98–111)
Creatinine, Ser: 1.01 mg/dL (ref 0.61–1.24)
GFR calc Af Amer: >60 mL/min (ref >60)
GFR calc non Af Amer: >60 mL/min (ref >60)
Glucose, Bld: 101 mg/dL — ABNORMAL HIGH (ref 70–99)
Potassium: 3.8 mmol/L (ref 3.5–5.1)
Sodium: 139 mmol/L (ref 135–145)
Total Bilirubin: 0.8 mg/dL (ref 0.3–1.2)
Total Protein: 7.1 g/dL (ref 6.5–8.1)

## 2019-01-08 LAB — LIPASE, BLOOD: Lipase: 28 U/L (ref 11–51)

## 2019-01-08 LAB — CBC WITH DIFFERENTIAL/PLATELET
Abs Immature Granulocytes: 0.04 10*3/uL (ref 0.00–0.07)
BASOS PCT: 0 %
Basophils Absolute: 0 10*3/uL (ref 0.0–0.1)
Eosinophils Absolute: 0.1 10*3/uL (ref 0.0–0.5)
Eosinophils Relative: 1 %
HCT: 35.3 % — ABNORMAL LOW (ref 39.0–52.0)
Hemoglobin: 11.5 g/dL — ABNORMAL LOW (ref 13.0–17.0)
Immature Granulocytes: 0 %
Lymphocytes Relative: 17 %
Lymphs Abs: 1.8 10*3/uL (ref 0.7–4.0)
MCH: 31.6 pg (ref 26.0–34.0)
MCHC: 32.6 g/dL (ref 30.0–36.0)
MCV: 97 fL (ref 80.0–100.0)
MONO ABS: 0.7 10*3/uL (ref 0.1–1.0)
Monocytes Relative: 7 %
Neutro Abs: 8.1 10*3/uL — ABNORMAL HIGH (ref 1.7–7.7)
Neutrophils Relative %: 75 %
Platelets: 411 10*3/uL — ABNORMAL HIGH (ref 150–400)
RBC: 3.64 MIL/uL — ABNORMAL LOW (ref 4.22–5.81)
RDW: 12.6 % (ref 11.5–15.5)
WBC: 10.7 10*3/uL — ABNORMAL HIGH (ref 4.0–10.5)
nRBC: 0 % (ref 0.0–0.2)

## 2019-01-08 MED ORDER — MORPHINE SULFATE (PF) 2 MG/ML IV SOLN
2.0000 mg | INTRAVENOUS | Status: DC | PRN
  Administered 2019-01-08 – 2019-01-09 (×4): 2 mg via INTRAVENOUS
  Filled 2019-01-08 (×4): qty 1

## 2019-01-08 MED ORDER — ONDANSETRON HCL 4 MG PO TABS: 4.0000 mg | ORAL_TABLET | Freq: Four times a day (QID) | ORAL | Status: DC | PRN

## 2019-01-08 MED ORDER — IOHEXOL 300 MG/ML  SOLN
100.0000 mL | Freq: Once | INTRAMUSCULAR | Status: AC | PRN
  Administered 2019-01-08: 100 mL via INTRAVENOUS

## 2019-01-08 MED ORDER — FAMOTIDINE IN NACL 20-0.9 MG/50ML-% IV SOLN
20.0000 mg | Freq: Two times a day (BID) | INTRAVENOUS | Status: DC
  Administered 2019-01-08 – 2019-01-11 (×6): 20 mg via INTRAVENOUS
  Filled 2019-01-08 (×6): qty 50

## 2019-01-08 MED ORDER — FENTANYL CITRATE (PF) 100 MCG/2ML IJ SOLN
50.0000 ug | Freq: Once | INTRAMUSCULAR | Status: AC
  Administered 2019-01-08: 50 ug via INTRAVENOUS
  Filled 2019-01-08: qty 2

## 2019-01-08 MED ORDER — BUDESONIDE 3 MG PO CPEP
6.0000 mg | ORAL_CAPSULE | Freq: Every day | ORAL | Status: DC
  Administered 2019-01-10 – 2019-01-14 (×5): 6 mg via ORAL
  Filled 2019-01-08 (×7): qty 2

## 2019-01-08 MED ORDER — KCL IN DEXTROSE-NACL 20-5-0.9 MEQ/L-%-% IV SOLN
INTRAVENOUS | Status: DC
  Administered 2019-01-08 (×2): via INTRAVENOUS
  Filled 2019-01-08 (×4): qty 1000

## 2019-01-08 MED ORDER — ONDANSETRON HCL 4 MG/2ML IJ SOLN
4.0000 mg | Freq: Once | INTRAMUSCULAR | Status: AC
  Administered 2019-01-08: 4 mg via INTRAVENOUS
  Filled 2019-01-08: qty 2

## 2019-01-08 MED ORDER — MORPHINE SULFATE (PF) 4 MG/ML IV SOLN
4.0000 mg | Freq: Once | INTRAVENOUS | Status: AC
  Administered 2019-01-08: 4 mg via INTRAVENOUS
  Filled 2019-01-08: qty 1

## 2019-01-08 MED ORDER — LIDOCAINE-PRILOCAINE 2.5-2.5 % EX CREA
1.0000 "application " | TOPICAL_CREAM | Freq: Every day | CUTANEOUS | Status: DC | PRN
  Filled 2019-01-08: qty 5

## 2019-01-08 MED ORDER — ADALIMUMAB 40 MG/0.8ML ~~LOC~~ AJKT
40.0000 mg | AUTO-INJECTOR | SUBCUTANEOUS | Status: DC
  Administered 2019-01-12: 40 mg via SUBCUTANEOUS
  Filled 2019-01-08: qty 1

## 2019-01-08 MED ORDER — ENOXAPARIN SODIUM 40 MG/0.4ML ~~LOC~~ SOLN
40.0000 mg | SUBCUTANEOUS | Status: DC
  Administered 2019-01-08 – 2019-01-13 (×6): 40 mg via SUBCUTANEOUS
  Filled 2019-01-08 (×6): qty 0.4

## 2019-01-08 MED ORDER — SODIUM CHLORIDE 0.9 % IV BOLUS
1000.0000 mL | Freq: Once | INTRAVENOUS | Status: AC
  Administered 2019-01-08: 1000 mL via INTRAVENOUS

## 2019-01-08 MED ORDER — ENZALUTAMIDE 40 MG PO CAPS: 160.0000 mg | ORAL_CAPSULE | Freq: Every day | ORAL | Status: DC

## 2019-01-08 MED ORDER — ONDANSETRON HCL 4 MG/2ML IJ SOLN
4.0000 mg | Freq: Four times a day (QID) | INTRAMUSCULAR | Status: DC | PRN
  Administered 2019-01-08 – 2019-01-09 (×3): 4 mg via INTRAVENOUS
  Filled 2019-01-08 (×3): qty 2

## 2019-01-08 MED ORDER — TRAZODONE HCL 50 MG PO TABS
50.0000 mg | ORAL_TABLET | Freq: Once | ORAL | Status: AC
  Administered 2019-01-09: 50 mg via ORAL
  Filled 2019-01-08: qty 1

## 2019-01-08 NOTE — ED Notes (Signed)
ED Provider at bedside. 

## 2019-01-08 NOTE — H&P (Signed)
History and Physical    Daniel Howard NMM:[id number] DOB: 1956-06-24 DOA: 01/08/2019  PCP: Alycia Rossetti, MD   Patient coming from: home  Chief Complaint: Abdominal pain  HPI: Daniel Howard is a 63 y.o. male with medical history significant for complicated Crohn's disease status post multiple surgeries including partial colectomy with colostomy in place, recurrent partial small bowel obstruction especially in the hernia, chronic abdominal pain as well as history of metastatic prostate cancer, protein calorie malnutrition who comes to the ER for evaluation of abdominal pain described as generalized crampy rib pain that started  Last night w associated nausea. He denies any associated vomiting, fever chills, cough, chest pain.  He has regular ostomy outputs. He describes this episode as similar to prior episodes.  ED Course: Vitals stable, labs fairly unremarkable as listed below, Ct ABD/PELVIS "Multiple dilated small bowel loops in the abdomen and pelvis likely secondary to an incarcerated dilated bowel loop in the left lower abdomen hernia. The hernia measures 16.5 x 7.6 x 17.6 cm and contain multiple bowel loops and mesenteric fat".  General surgery was consulted, who saw the patient in the ER and felt that CAT scan finding were not much changed from previous CT scan finding this month, and did not recommend any surgical intervention at this time and NG tube is being inserted patient is being admitted for a small bowel obstruction. He was recently admitted and discharged on 1/12 for similar presentation.   Review of Systems: All systems were reviewed and were negative except as mentioned in HPI above.   Past Medical History:  Diagnosis Date  . Abnormal finding of biliary tract    MRCP shows pancreatic/biliary tract dilation. EUS 2010 confirmed dilation but no chronic pancreaitis or mass. Vascular ectasia crimpoing distal CBD.   [name] Anxiety   . 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 Duodenal ulcer 2010   nsaids  . History of blood transfusion 1992   "related to colon OR"  . Peristomal hernia   . Prostate cancer ([place]) 2018  . SBO (small bowel obstruction) ([place]) 11/10/2018  . Small bowel obstruction ([place]) 10/2017; 11/21/2017; 02/16/2018  . Spigelian hernia    bilateral    Past Surgical History:  Procedure Laterality Date  . APPENDECTOMY  1992   at time of exp laparotomy at which time he was noted to have fistulizing Crohn's rather than UC  . COLON SURGERY    . COLONOSCOPY [name] 08/23/2014   RPR:[id number] proctoscopy with possible fistulous opening in thebase of rectal/anal stump.    . COLOSTOMY  1992   transverse loop colostomy secondary to a stricture  . ESOPHAGOGASTRODUODENOSCOPY  05/2009   SLF: multiple antral erosions, large ulcer at ansatomosis (postsurgical changes at duodenal bulb and second portion of duodenum) BX c/x NSAIDS.  . EUS  10/04/2009   Dr. Estill Bakes dilated CBD and main pancreatic duct.  No pancreatic  . EXPLORATORY LAPAROTOMY  1992  . FLEXIBLE SIGMOIDOSCOPY  1988   Dr. Laural Golden- suggested rohn's disease but the biopsies were not collaborative.  [name] FLEXIBLE SIGMOIDOSCOPY [name] 09/08/2016   Procedure: FLEXIBLE SIGMOIDOSCOPY;  Surgeon: Wonda Horner, MD;  Location[facility]COPY;  Service: Gastroenterology;  Laterality:  [name];  . HEMICOLOECTOMY W/ ANASTOMOSIS  1993   R- Dr.DeMason   . HERNIA REPAIR  1996   incarcerated periostial hernia with additional surgery in 1999  . IR FLUORO GUIDE PORT INSERTION RIGHT  04/03/2017  . IR US GUIDE VASC ACCESS RIGHT  04/03/2017     reports that he quit smoking about 9 years ago.  His smoking use included cigarettes. He has a 24.00 pack-year smoking history. He has never used smokeless tobacco. He reports that he does not drink alcohol or use drugs.  Allergies  Allergen Reactions  . Penicillins Hives    Has patient had a PCN reaction causing immediate rash, facial/tongue/throat swelling, SOB or lightheadedness with hypotension: Yes Has patient had a PCN reaction causing severe rash involving mucus membranes or skin necrosis: No Has patient had a PCN reaction that required hospitalization: No Has patient had a PCN reaction occurring within the last 10 years: No If all of the above answers are "NO", then may proceed with Cephalosporin use.     Family History  Problem Relation Age of Onset  . Cancer Father        prostate   . Prostate cancer Father   . Colon cancer Father 74  . Hypertension Sister   . Cancer Sister   . Depression Sister   . Breast cancer Sister   . COPD Sister   . Aneurysm Brother        deceased, brain aneurysm     Prior to Admission medications   Medication Sig Start Date End Date Taking? Authorizing Provider  Adalimumab (HUMIRA PEN) 40 MG/0.8ML PNKT Inject 1 Syringe into the skin once a week. Patient taking differently: Inject 40 mg into the skin every Wednesday.  09/01/18  Yes Annitta Needs, NP  budesonide (ENTOCORT EC) 3 MG 24 hr capsule Take 3 capsules (9 mg total) by mouth daily. Patient taking differently: Take 6 mg by mouth daily.  12/03/18  Yes Bonnielee Haff, MD  Calcium Carb-Cholecalciferol (CALCIUM 600-D PO) Take 2 tablets by mouth daily.   Yes [provider]  ferrous sulfate 325 (65 FE) MG tablet Take 325 mg by mouth daily with breakfast.   Yes [provider]  HYDROcodone-acetaminophen (NORCO) 10-325 MG tablet Take 1 tablet by mouth every 8 (eight) hours as needed. Patient taking differently: Take 1.5 tablets by mouth every 8 (eight) hours as needed (pain).  12/08/18  Yes Capron, [name], MD    lidocaine-prilocaine (EMLA) cream Apply 1 application topically daily as needed (when accessing port).    Yes [provider]  mirtazapine (REMERON) 15 MG tablet TAKE 1 TABLET BY MOUTH AT BEDTIME Patient taking differently: Take 15 mg by mouth at bedtime.  09/27/18  Yes , [name], MD  Multiple Vitamin (MULTIVITAMIN WITH MINERALS) TABS tablet Take 1 tablet by mouth daily. Centrum   Yes [provider]  SIMETHICONE PO Take 1-2 tablets by mouth daily as needed (gas/flatulence).   Yes [provider]  XTANDI 40 MG capsule TAKE 4 CAPSULES (160 MG TOTAL) BY MOUTH DAILY. Patient taking differently: Take 160 mg by mouth daily.  12/14/18  Yes [name], MD    Physical Exam: Vitals:   01/08/19 1230 01/08/19 1235 01/08/19 1400 01/08/19 1445  BP: 125/83 124/81 115/73 121/70  Pulse: 77  79 79  Resp:  14 14 16   Temp:  98.3 F (36.8 C)    TempSrc:  Oral    SpO2: 99% 97% 97% 99%  Weight:  57.6 kg      Constitutional: NAD, calm, comfortable Vitals:   01/08/19 1230 01/08/19 1235 01/08/19 1400 01/08/19 1445  BP: 125/83 124/81 115/73 121/70  Pulse: 77  79 79  Resp:  14 14 16   Temp:  98.3 F (36.8 C)    TempSrc:  Oral    SpO2: 99% 97% 97% 99%  Weight:  57.6 kg      General : Alert awake, chronically sick looking.   NGT+ Eyes: PERRL, lids and conjunctivae normal ENMT: Mucous membranes are moist. Posterior pharynx clear of any exudate or lesions. Normal dentition.  Neck: Normal, supple, no masses, no thyromegaly Respiratory: Bilaterally clear to auscultation, no wheezing or crackles.Normal respiratory effort.No accessory muscle use.  Cardiovascular: Regular rate and rhythm, no murmurs / rubs / gallops.2+ pedal pulses. No carotid bruits.  Abdomen: Soft, multiple scars +, BS +.  Colostomy + w stool in it Musculoskeletal: no clubbing/cyanosis. No joint deformity upper and lower extremities. Good ROM, no contractures. Normal muscle tone.  Skin: No rashes,  lesions, ulcers.No induration Neurologic: CN 2-12 grossly intact. Sensation intact, able to move upper and lower extremities with no focal weakness Psychiatric: Normal judgment and insight. Alert and oriented x 3. Normal mood.   Foley Catheter:  Labs on Admission: I have personally reviewed following labs and imaging studies  CBC: Recent Labs  Lab 01/08/19 1423  WBC 10.7*  NEUTROABS 8.1*  HGB 11.5*  HCT 35.3*  MCV 97.0  PLT 338*   Basic Metabolic Panel: Recent Labs  Lab 01/08/19 1423  NA 139  K 3.8  CL 101  CO2 28  GLUCOSE 101*  BUN 9  CREATININE 1.01  CALCIUM 8.1*   GFR: Estimated Creatinine Clearance: 63.4 mL/min (by C-G formula based on SCr of 1.01 mg/dL). Liver Function Tests: Recent Labs  Lab 01/08/19 1423  AST 23  ALT 19  ALKPHOS 50  BILITOT 0.8  PROT 7.1  ALBUMIN 3.0*   Recent Labs  Lab 01/08/19 1423  LIPASE 28   No results for input(s): AMMONIA in the last 168 hours. Coagulation Profile: No results for input(s): INR, PROTIME in the last 168 hours. Cardiac Enzymes: No results for input(s): CKTOTAL, CKMB, CKMBINDEX, TROPONINI in the last 168 hours. BNP (last 3 results) No results for input(s): PROBNP in the last 8760 hours. HbA1C: No results for input(s): HGBA1C in the last 72 hours. CBG: No results for input(s): GLUCAP in the last 168 hours. Lipid Profile: No results for input(s): CHOL, HDL, LDLCALC, TRIG, CHOLHDL, LDLDIRECT in the last 72 hours. Thyroid Function Tests: No results for input(s): TSH, T4TOTAL, FREET4, T3FREE, THYROIDAB in the last 72 hours. Anemia Panel: No results for input(s): VITAMINB12, FOLATE, FERRITIN, TIBC, IRON, RETICCTPCT in the last 72 hours. Urine analysis:    Component Value Date/Time   COLORURINE YELLOW 12/29/2018 1114   APPEARANCEUR CLEAR 12/29/2018 1114   LABSPEC >1.046 (H) 12/29/2018 1114   PHURINE 6.0 12/29/2018 [place] 12/29/2018 1114   [place] 12/29/2018 [place] 12/29/2018 1114   KETONESUR NEGATIVE 12/29/2018 1114   PROTEINUR NEGATIVE 12/29/2018 1114   UROBILINOGEN 0.2 10/10/2014 1037   NITRITE NEGATIVE 12/29/2018 1114   LEUKOCYTESUR NEGATIVE 12/29/2018 1114    Radiological Exams on Admission: Ct Abdomen Pelvis W Contrast  Result Date: 01/08/2019 CLINICAL DATA:  Diffuse abdomen pain since last night. EXAM: CT ABDOMEN AND PELVIS WITH CONTRAST TECHNIQUE: Multidetector CT imaging of the abdomen and pelvis was performed using the standard protocol  following bolus administration of intravenous contrast. CONTRAST:  125m OMNIPAQUE IOHEXOL 300 MG/ML  SOLN COMPARISON:  December 29, 2018 FINDINGS: Lower chest: Mild atelectasis of bilateral lung bases are noted. The heart size is normal. Hepatobiliary: No focal liver lesions identified. The gallbladder is not definitely seen. There is probable dilatation of common bowel duct measuring 8.6 mm. Pancreas: There is pancreatic ductal dilatation measuring 6 mm. No focal lesion is identified within the pancreas. Spleen: Normal in size without focal abnormality. Adrenals/Urinary Tract: The bilateral adrenal glands are normal. Bilateral kidney cysts are noted. There is no hydronephrosis bilaterally. Bladder is partially decompressed. The calcifications are identified in the posterior left bladder lumen. Stomach/Bowel: There are multiple dilated small bowel loops in the right upper quadrant, right mid abdomen, mid abdomen and lower abdomen likely secondary to an incarcerated dilated bowel loop in a left lower anterior abdominal hernia. The hernia measures 16.5 x 7.6 x 17.6 cm and contain multiple bowel loops and mesenteric fat. There is a small hiatal hernia. The stomach is otherwise normal. A right lower quadrant ostomy is identified. Vascular/Lymphatic: No significant vascular findings are present. No enlarged abdominal or pelvic lymph nodes. Reproductive: Prostate is unremarkable. Other: None. Musculoskeletal:   Degenerative joint changes of the spine are noted. IMPRESSION: Multiple dilated small bowel loops in the abdomen and pelvis likely secondary to an incarcerated dilated bowel loop in the left lower abdomen hernia. The hernia measures 16.5 x 7.6 x 17.6 cm and contain multiple bowel loops and mesenteric fat. A right lower quadrant ostomy is identified. These results will be called to the ordering clinician or representative by the Radiologist Assistant, and communication documented in the PACS or zVision Dashboard. Electronically Signed   By: WAbelardo DieselM.D.   On: 01/08/2019 16:13     Assessment/Plan  Partial small bowel obstruction in the setting of Crohn's disease with history of recurrent small bowel obstruction.  CT scan findings noted.  Agree with NG tube insertion, conservative management with n.p.o., IV fluids, pain control w iv opiates.  Surgery has seen the patient in the ER, do not anticipate surgical intervention given extensive scars and previous surgeries.   Crohn's disease of both small and large intestine with complication : colostomy in place. Cont plan as above.  He has had frequent exacerbation.  [facility]  [facility] Resume oral medication as tolerated.  Severity of Illness:  * I certify that at the point of admission it is my clinical judgment that the patient will require inpatient hospital care spanning beyond 2 midnights from the point of admission due to high intensity of service, high risk for further deterioration and high frequency of surveillance required.*    DVT prophylaxis: Subcu Lovenox Code Status: Full code Family Communication: Admission, patients condition and plan of care including tests being ordered have been discussed with the patient  who indicate understanding and agree with the plan and Code Status.  Consults called:  RAntonieta PertMD [facility]  01/08/2019, 5:55  PM

## 2019-01-08 NOTE — ED Provider Notes (Signed)
[facility] EMERGENCY DEPARTMENT Provider Note   CSN: [id number] Arrival date & time: 01/08/19  1220     History   Chief Complaint Chief Complaint  Patient presents with  . Abdominal Pain    HPI Daniel Howard is a 63 y.o. male.  Patient is a 63 year old male with past medical history of Crohn's disease with colostomy, prostate cancer undergoing treatment with oral chemotherapy.  He presents today for evaluation of abdominal pain.  He reports generalized crampy pain that started this morning.  He denies any vomiting and does state that his ostomy outs are normal.  He has had a history of bowel obstructions in the past and was admitted last week for this.  The history is provided by the patient.  Abdominal Pain  Pain location:  Generalized Pain quality: cramping   Pain radiates to:  Does not radiate Pain severity:  Moderate Onset quality:  Sudden Duration:  4 hours Timing:  Constant Progression:  Worsening Chronicity:  Recurrent Relieved by:  Nothing Worsened by:  Nothing Ineffective treatments:  None tried   Past Medical History:  Diagnosis Date  . Abnormal finding of biliary tract    MRCP shows pancreatic/biliary tract dilation. EUS 2010 confirmed dilation but no chronic pancreaitis or mass. Vascular ectasia crimpoing distal CBD.   [name] Anxiety   . 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 Duodenal ulcer 2010   nsaids  . History of blood transfusion 1992   "related to colon OR"  . Peristomal hernia   . Prostate cancer ([place]) 2018  . SBO (small bowel obstruction) ([place]) 11/10/2018  . Small bowel obstruction ([place])  10/2017; 11/21/2017; 02/16/2018  . Spigelian hernia    bilateral    Patient Active Problem List   Diagnosis Date Noted  . Small bowel obstruction due to adhesions ([place]) 12/29/2018  . Small bowel obstruction ([place]) 12/03/2018  . Partial small bowel obstruction ([place]) 11/01/2018  . SBO (small bowel obstruction) ([place]) 09/30/2018  . Iron deficiency anemia 07/03/2018  . 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 Nausea without vomiting 11/10/2017  . Incisional hernia 09/22/2017  . Abdominal pain of multiple sites 09/22/2017  . Goals of care, counseling/discussion 04/02/2017  . Prostate cancer metastatic to bone ([place]) 04/02/2017  . Elevated PSA 03/04/2017  . Osteopenia 11/03/2016  . Pelvic mass in male 09/04/2016  . 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 Exacerbation of Crohn's disease ([place]) 09/04/2016  . Protein-calorie malnutrition, severe ([place]) 07/27/2014  . Loss of weight 06/09/2014  . Crohn's disease of both small and large intestine with complication ([place]) 30/16/0109  . Boils 02/11/2013  . Ventral hernia 12/11/2009  . Anemia 12/03/2009  . Regional enteritis/Crohn's 01/25/2007    Past Surgical History:  Procedure Laterality Date  . APPENDECTOMY  1992   at time of exp laparotomy at which time he was noted to have fistulizing Crohn's rather than UC  . COLON SURGERY    . COLONOSCOPY [name] 08/23/2014   NAT:[id number] proctoscopy with possible fistulous opening in thebase of rectal/anal stump.    . COLOSTOMY  1992   transverse loop colostomy secondary to a stricture  . ESOPHAGOGASTRODUODENOSCOPY  05/2009   SLF: multiple antral erosions, large ulcer at  ansatomosis (postsurgical changes at duodenal bulb and second portion of duodenum) BX c/x NSAIDS.  . EUS  10/04/2009   Dr. Estill Bakes dilated CBD and main pancreatic duct.  No pancreatic  . EXPLORATORY LAPAROTOMY  1992  . FLEXIBLE SIGMOIDOSCOPY  1988   Dr. Laural Golden- suggested rohn's disease but the biopsies were not collaborative.  [name] FLEXIBLE  SIGMOIDOSCOPY [name] 09/08/2016   Procedure: FLEXIBLE SIGMOIDOSCOPY;  Surgeon: Wonda Horner, MD;  Location: [facility] ENDOSCOPY;  Service: Gastroenterology;  Laterality: [name];  . HEMICOLOECTOMY W/ ANASTOMOSIS  1993   R- Dr.DeMason   . HERNIA REPAIR  1996   incarcerated periostial hernia with additional surgery in 1999  . IR FLUORO GUIDE PORT INSERTION RIGHT  04/03/2017  . IR US GUIDE VASC ACCESS RIGHT  04/03/2017        Home Medications    Prior to Admission medications   Medication Sig Start Date End Date Taking? Authorizing Provider  Adalimumab (HUMIRA PEN) 40 MG/0.8ML PNKT Inject 1 Syringe into the skin once a week. Patient taking differently: Inject 1 Syringe into the skin every Wednesday.  09/01/18   Annitta Needs, NP  budesonide (ENTOCORT EC) 3 MG 24 hr capsule Take 3 capsules (9 mg total) by mouth daily. Patient taking differently: Take 6 mg by mouth daily.  12/03/18   Bonnielee Haff, MD  calcium carbonate (OS-CAL) 600 MG TABS tablet Take 1,200 mg by mouth daily with breakfast.    [provider]  ferrous sulfate 325 (65 FE) MG tablet Take 325 mg by mouth daily with breakfast.    [provider]  HYDROcodone-acetaminophen (NORCO) 10-325 MG tablet Take 1 tablet by mouth every 8 (eight) hours as needed. Patient taking differently: Take 1 tablet by mouth every 8 (eight) hours as needed for moderate pain.  12/08/18   Alycia Rossetti, MD  lidocaine-prilocaine (EMLA) cream Apply 1 application topically as needed (when accessing port).    [provider]  mirtazapine (REMERON) 15 MG tablet TAKE 1 TABLET BY MOUTH AT BEDTIME Patient taking differently: Take 15 mg by mouth at bedtime.  09/27/18   [place], [name], MD  Multiple Vitamins-Minerals (CENTRUM SILVER ADULT 50+) TABS Take 1 tablet by mouth daily.    [provider]  XTANDI 40 MG capsule TAKE 4 CAPSULES (160 MG TOTAL) BY MOUTH DAILY. Patient taking differently: Take 160 mg by mouth daily.  12/14/18    [name], MD    Family History Family History  Problem Relation Age of Onset  . Cancer Father        prostate   . Prostate cancer Father   . Colon cancer Father 61  . Hypertension Sister   . Cancer Sister   . Depression Sister   . Breast cancer Sister   . COPD Sister   . Aneurysm Brother        deceased, brain aneurysm    Social History Social History   Tobacco Use  . Smoking status: Former Smoker    Packs/day: 0.75    Years: 32.00    Pack years: 24.00    Types: Cigarettes    Last attempt to quit: 12/21/2009    Years since quitting: 9.0  . Smokeless tobacco: Never Used  Substance Use Topics  . Alcohol use: No    Alcohol/week: 0.0 standard drinks    Comment: Former drinker  . Drug use: No     Allergies   Penicillins   Review of Systems Review of Systems  Gastrointestinal: Positive  for abdominal pain.  All other systems reviewed and are negative.    Physical Exam Updated Vital Signs BP 124/81 (BP Location: Left Arm)   Pulse 77   Temp 98.3 F (36.8 C) (Oral)   Resp 14   Wt 57.6 kg   SpO2 97%   BMI 21.46 kg/m   Physical Exam Vitals signs and nursing note reviewed.  Constitutional:      General: He is not in acute distress.    Appearance: He is well-developed. He is not diaphoretic.  HENT:     Head: Normocephalic and atraumatic.  Neck:     Musculoskeletal: Normal range of motion and neck supple.  Cardiovascular:     Rate and Rhythm: Normal rate and regular rhythm.     Heart sounds: No murmur. No friction rub.  Pulmonary:     Effort: Pulmonary effort is normal. No respiratory distress.     Breath sounds: Normal breath sounds. No wheezing or rales.  Abdominal:     General: Bowel sounds are normal. There is no distension.     Palpations: Abdomen is soft.     Tenderness: There is generalized abdominal tenderness. There is no guarding or rebound.  Musculoskeletal: Normal range of motion.  Skin:    General: Skin is warm and dry.    Neurological:     Mental Status: He is alert and oriented to person, place, and time.     Coordination: Coordination normal.      ED Treatments / Results  Labs (all labs ordered are listed, but only abnormal results are displayed) Labs Reviewed  LIPASE, BLOOD  COMPREHENSIVE METABOLIC PANEL  CBC WITH DIFFERENTIAL/PLATELET    EKG None  Radiology No results found.  Procedures Procedures (including critical care time)  Medications Ordered in ED Medications  sodium chloride 0.9 % bolus 1,000 mL (has no administration in time range)  morphine 4 MG/ML injection 4 mg (has no administration in time range)  ondansetron (ZOFRAN) injection 4 mg (has no administration in time range)     Initial Impression / Assessment and Plan / ED Course  I have reviewed the triage vital signs and the nursing notes.  Pertinent labs & imaging results that were available during my care of the patient were reviewed by me and considered in my medical decision making (see chart for details).  Patient with history of Crohn's disease and metastatic prostate cancer.  He has a colostomy.  He presents with abdominal pain.  He was recently admitted for a small bowel obstruction and believes this may be recurring.  He is not having any vomiting and does state that his ostomy seems to be working.  There is no blood that he has noticed.  His work-up reveals a white cell count of 11,000 with unremarkable electrolytes.  Patient will undergo a CT scan to determine whether or not he has developed a recurrent bowel obstruction or possibly a flare of his Crohn's disease.  Care will be signed out to Dr. Laverta [place] at shift change.  He will obtain the results of the CT scan and determine the final disposition.  Final Clinical Impressions(s) / ED Diagnoses   Final diagnoses:  None    ED Discharge Orders    None       [name], MD 01/08/19 1517

## 2019-01-08 NOTE — ED Provider Notes (Signed)
Blood pressure 124/81, pulse 77, temperature 98.3 F (36.8 C), temperature source Oral, resp. rate 14, weight 57.6 kg, SpO2 97 %.  Assuming care from Dr. Stark Jock.  In short, Daniel Howard is a 63 y.o. male with a chief complaint of Abdominal Pain .  Refer to the original H&P for additional details.  The current plan of care is to follow up CT abdomen/pelvis and reassess.  04:40 PM CT resulted with high-grade obstruction likely in the hernia.  Reviewed this with the patient and discussed that we would be placing an NG tube.  Will give additional medication for pain.  No vomiting here.  I discussed the case with Dr. Dema Severin, with general surgery.  He will be down to evaluate the patient for admission.   Recommends medical mgmt with NG tube and IVF. No acute surgical intervention.   Discussed patient's case with Hospitalist to request admission. Patient and family (if present) updated with plan. Care transferred to Hospitalist service.  I reviewed all nursing notes, vitals, pertinent old records, EKGs, labs, imaging (as available).    Margette Fast, MD 01/08/19 2243

## 2019-01-08 NOTE — ED Triage Notes (Signed)
Pt arrived via [facility] from home c/o abd pain. Pt was seen recently for a bowel obstruction. Pt is alert and oriented x4 at this time. EMS v/s 132/84, 82hr, 18rr, 99%ra, 102 cbg.

## 2019-01-08 NOTE — ED Notes (Signed)
Patient transported to imaging.

## 2019-01-08 NOTE — H&P (Signed)
CC: Abdominal discomfort, consult by Dr. Laverta Baltimore in ED  HPI: Daniel Howard is an 63 y.o. male with hx of Crohn's disease well known to our service, stage IV prostate cancer whom presented to the ED today for abdominal discomfort that started last night. States he has had "this exact same issue"  At least 3-4 times in the past 4-5 months. He feels his current episode is the same as his previous ones. He was discharged from the hospital 2 weeks ago after being admitted to the medicine service and managed without surgery. He reports no n/v. He has an ileostomy and has been having output - has had to empty his bag once overnight and then twice today.   He has been admitted multiple times in the past for this same issue, managed nonoperatively with NPO/NG and subsequent resolution.  He has had multiple abdominal surgeries in the past (estimates 12) dating back to the early 1990s for Crohn's all done in [place] with Dr. Anthony Sar. He follows in [place] with Dr. Sydell Axon. He is maintained on Humira and budesonide. He reports his last Crohn's flare was in August/Sept 2019.   He is currently on chemotherapy for metastatic prostate cancer, diagnosed 02/2017 - noted to have a large pelvic mass at that time. He is being followed in oncology with Dr. Alen Blew and has developed castrate resistant disease.   Past Medical History:  Diagnosis Date  . Abnormal finding of biliary tract    MRCP shows pancreatic/biliary tract dilation. EUS 2010 confirmed dilation but no chronic pancreaitis or mass. Vascular ectasia crimpoing distal CBD.   [name] Anxiety   . 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 Duodenal ulcer 2010   nsaids  . History of blood transfusion 1992   "related to colon OR"  . Peristomal hernia   . Prostate cancer ([place]) 2018  . SBO (small bowel obstruction) ([place]) 11/10/2018  . Small bowel obstruction ([place]) 10/2017; 11/21/2017; 02/16/2018  . Spigelian hernia    bilateral    Past Surgical History:  Procedure Laterality Date  . APPENDECTOMY  1992   at time of exp laparotomy at which time he was noted to have fistulizing Crohn's rather than UC  . COLON SURGERY    . COLONOSCOPY [name] 08/23/2014   FOY:DXAJOIN proctoscopy with possible fistulous opening in thebase of rectal/anal stump.    . COLOSTOMY  1992   transverse loop colostomy secondary to a stricture  . ESOPHAGOGASTRODUODENOSCOPY  05/2009   SLF: multiple antral erosions, large ulcer at ansatomosis (postsurgical changes at duodenal bulb and second portion of duodenum) BX c/x NSAIDS.  . EUS  10/04/2009   Dr. Estill Bakes dilated CBD and main pancreatic duct.  No pancreatic  . EXPLORATORY LAPAROTOMY  1992  . FLEXIBLE SIGMOIDOSCOPY  1988   Dr. Laural Golden- suggested rohn's disease but the biopsies were not collaborative.  [name] FLEXIBLE SIGMOIDOSCOPY [name] 09/08/2016   Procedure: FLEXIBLE SIGMOIDOSCOPY;  Surgeon: Wonda Horner, MD;  Location: [facility] ENDOSCOPY;  Service: Gastroenterology;  Laterality: [name];  . HEMICOLOECTOMY W/ ANASTOMOSIS  1993   R- Dr.DeMason   . HERNIA REPAIR  1996   incarcerated periostial hernia with additional surgery in 1999  . IR FLUORO GUIDE  PORT INSERTION RIGHT  04/03/2017  . IR US GUIDE VASC ACCESS RIGHT  04/03/2017    Family History  Problem Relation Age of Onset  . Cancer Father        prostate   . Prostate cancer Father   . Colon cancer Father 70  . Hypertension Sister   . Cancer Sister   . Depression Sister   . Breast cancer Sister   . COPD Sister   . Aneurysm Brother        deceased, brain aneurysm    Social:  reports that he quit smoking about 9 years ago. His smoking use  included cigarettes. He has a 24.00 pack-year smoking history. He has never used smokeless tobacco. He reports that he does not drink alcohol or use drugs.  Allergies:  Allergies  Allergen Reactions  . Penicillins Hives    Has patient had a PCN reaction causing immediate rash, facial/tongue/throat swelling, SOB or lightheadedness with hypotension: Yes Has patient had a PCN reaction causing severe rash involving mucus membranes or skin necrosis: No Has patient had a PCN reaction that required hospitalization: No Has patient had a PCN reaction occurring within the last 10 years: No If all of the above answers are "NO", then may proceed with Cephalosporin use.     Medications: I have reviewed the patient's current medications.  Results for orders placed or performed during the hospital encounter of 01/08/19 (from the past 48 hour(s))  Lipase, blood     Status: None   Collection Time: 01/08/19  2:23 PM  Result Value Ref Range   Lipase 28 11 - 51 U/L    Comment: Performed at [facility], [place] [address] [place], [place] [address]  Comprehensive metabolic panel     Status: Abnormal   Collection Time: 01/08/19  2:23 PM  Result Value Ref Range   Sodium 139 135 - 145 mmol/L   Potassium 3.8 3.5 - 5.1 mmol/L   Chloride 101 98 - 111 mmol/L   CO2 28 22 - 32 mmol/L   Glucose, Bld 101 (H) 70 - 99 mg/dL   BUN 9 6 - 20 mg/dL   Creatinine, Ser 1.01 0.61 - 1.24 mg/dL   Calcium 8.1 (L) 8.9 - 10.3 mg/dL   Total Protein 7.1 6.5 - 8.1 g/dL   Albumin 3.0 (L) 3.5 - 5.0 g/dL   AST 23 15 - 41 U/L   ALT 19 0 - 44 U/L   Alkaline Phosphatase 50 38 - 126 U/L   Total Bilirubin 0.8 0.3 - 1.2 mg/dL   GFR calc non Af Amer >60 >60 mL/min   GFR calc Af Amer >60 >60 mL/min   Anion gap 10 5 - 15    Comment: Performed at [facility] [address] [place], [place] [address]  CBC with Differential     Status: Abnormal   Collection Time: 01/08/19  2:23 PM  Result Value Ref Range   WBC 10.7 (H)  4.0 - 10.5 K/uL   RBC 3.64 (L) 4.22 - 5.81 MIL/uL   Hemoglobin 11.5 (L) 13.0 - 17.0 g/dL   HCT 35.3 (L) 39.0 - 52.0 %   MCV 97.0 80.0 - 100.0 fL   MCH 31.6 26.0 - 34.0 pg   MCHC 32.6 30.0 - 36.0 g/dL   RDW 12.6 11.5 - 15.5 %   Platelets 411 (H) 150 - 400 K/uL   nRBC 0.0 0.0 - 0.2 %   Neutrophils Relative % 75 %  Neutro Abs 8.1 (H) 1.7 - 7.7 K/uL   Lymphocytes Relative 17 %   Lymphs Abs 1.8 0.7 - 4.0 K/uL   Monocytes Relative 7 %   Monocytes Absolute 0.7 0.1 - 1.0 K/uL   Eosinophils Relative 1 %   Eosinophils Absolute 0.1 0.0 - 0.5 K/uL   Basophils Relative 0 %   Basophils Absolute 0.0 0.0 - 0.1 K/uL   Immature Granulocytes 0 %   Abs Immature Granulocytes 0.04 0.00 - 0.07 K/uL    Comment: Performed at [facility] [address] [place], [place] [address]    Ct Abdomen Pelvis W Contrast  Result Date: 01/08/2019 CLINICAL DATA:  Diffuse abdomen pain since last night. EXAM: CT ABDOMEN AND PELVIS WITH CONTRAST TECHNIQUE: Multidetector CT imaging of the abdomen and pelvis was performed using the standard protocol following bolus administration of intravenous contrast. CONTRAST:  162m OMNIPAQUE IOHEXOL 300 MG/ML  SOLN COMPARISON:  December 29, 2018 FINDINGS: Lower chest: Mild atelectasis of bilateral lung bases are noted. The heart size is normal. Hepatobiliary: No focal liver lesions identified. The gallbladder is not definitely seen. There is probable dilatation of common bowel duct measuring 8.6 mm. Pancreas: There is pancreatic ductal dilatation measuring 6 mm. No focal lesion is identified within the pancreas. Spleen: Normal in size without focal abnormality. Adrenals/Urinary Tract: The bilateral adrenal glands are normal. Bilateral kidney cysts are noted. There is no hydronephrosis bilaterally. Bladder is partially decompressed. The calcifications are identified in the posterior left bladder lumen. Stomach/Bowel: There are multiple dilated small bowel loops in the right upper  quadrant, right mid abdomen, mid abdomen and lower abdomen likely secondary to an incarcerated dilated bowel loop in a left lower anterior abdominal hernia. The hernia measures 16.5 x 7.6 x 17.6 cm and contain multiple bowel loops and mesenteric fat. There is a small hiatal hernia. The stomach is otherwise normal. A right lower quadrant ostomy is identified. Vascular/Lymphatic: No significant vascular findings are present. No enlarged abdominal or pelvic lymph nodes. Reproductive: Prostate is unremarkable. Other: None. Musculoskeletal:  Degenerative joint changes of the spine are noted. IMPRESSION: Multiple dilated small bowel loops in the abdomen and pelvis likely secondary to an incarcerated dilated bowel loop in the left lower abdomen hernia. The hernia measures 16.5 x 7.6 x 17.6 cm and contain multiple bowel loops and mesenteric fat. A right lower quadrant ostomy is identified. These results will be called to the ordering clinician or representative by the Radiologist Assistant, and communication documented in the PACS or zVision Dashboard. Electronically Signed   By: WAbelardo DieselM.D.   On: 01/08/2019 16:13    ROS - all of the below systems have been reviewed with the patient and positives are indicated with bold text General: chills, fever or night sweats Eyes: blurry vision or double vision ENT: epistaxis or sore throat Allergy/Immunology: itchy/watery eyes or nasal congestion Hematologic/Lymphatic: bleeding problems, blood clots or swollen lymph nodes Endocrine: temperature intolerance or unexpected weight changes Breast: new or changing breast lumps or nipple discharge Resp: cough, shortness of breath, or wheezing CV: chest pain or dyspnea on exertion GI: as per HPI GU: dysuria, trouble voiding, or hematuria MSK: joint pain or joint stiffness Neuro: TIA or stroke symptoms Derm: pruritus and skin lesion changes Psych: anxiety and depression  PE Blood pressure 121/70, pulse 79,  temperature 98.3 F (36.8 C), temperature source Oral, resp. rate 16, weight 57.6 kg, SpO2 99 %. Constitutional: NAD; conversant; no deformities Eyes: Moist conjunctiva; no lid lag;  anicteric; PERRL Neck: Trachea midline; no thyromegaly Lungs: Normal respiratory effort; no tactile fremitus CV: RRR; no palpable thrills; no pitting edema GI: Abd soft, somewhat distended, nontender; no rebound; no guarding. LLQ hernia with no overlying skin changes; hernia is soft and with gentle pressure, the contents of the small intestine appear to be emptying and hernia reduces in size. MSK: Normal gait; no clubbing/cyanosis Psychiatric: Appropriate affect; alert and oriented x3 Lymphatic: No palpable cervical or axillary lymphadenopathy  Results for orders placed or performed during the hospital encounter of 01/08/19 (from the past 48 hour(s))  Lipase, blood     Status: None   Collection Time: 01/08/19  2:23 PM  Result Value Ref Range   Lipase 28 11 - 51 U/L    Comment: Performed at [facility], [place] [address], [place], [place] [address]  Comprehensive metabolic panel     Status: Abnormal   Collection Time: 01/08/19  2:23 PM  Result Value Ref Range   Sodium 139 135 - 145 mmol/L   Potassium 3.8 3.5 - 5.1 mmol/L   Chloride 101 98 - 111 mmol/L   CO2 28 22 - 32 mmol/L   Glucose, Bld 101 (H) 70 - 99 mg/dL   BUN 9 6 - 20 mg/dL   Creatinine, Ser 1.01 0.61 - 1.24 mg/dL   Calcium 8.1 (L) 8.9 - 10.3 mg/dL   Total Protein 7.1 6.5 - 8.1 g/dL   Albumin 3.0 (L) 3.5 - 5.0 g/dL   AST 23 15 - 41 U/L   ALT 19 0 - 44 U/L   Alkaline Phosphatase 50 38 - 126 U/L   Total Bilirubin 0.8 0.3 - 1.2 mg/dL   GFR calc non Af Amer >60 >60 mL/min   GFR calc Af Amer >60 >60 mL/min   Anion gap 10 5 - 15    Comment: Performed at [facility] [address], [place], [place] [address]  CBC with Differential     Status: Abnormal   Collection Time: 01/08/19  2:23 PM  Result Value Ref Range   WBC 10.7 (H) 4.0 -  10.5 K/uL   RBC 3.64 (L) 4.22 - 5.81 MIL/uL   Hemoglobin 11.5 (L) 13.0 - 17.0 g/dL   HCT 35.3 (L) 39.0 - 52.0 %   MCV 97.0 80.0 - 100.0 fL   MCH 31.6 26.0 - 34.0 pg   MCHC 32.6 30.0 - 36.0 g/dL   RDW 12.6 11.5 - 15.5 %   Platelets 411 (H) 150 - 400 K/uL   nRBC 0.0 0.0 - 0.2 %   Neutrophils Relative % 75 %   Neutro Abs 8.1 (H) 1.7 - 7.7 K/uL   Lymphocytes Relative 17 %   Lymphs Abs 1.8 0.7 - 4.0 K/uL   Monocytes Relative 7 %   Monocytes Absolute 0.7 0.1 - 1.0 K/uL   Eosinophils Relative 1 %   Eosinophils Absolute 0.1 0.0 - 0.5 K/uL   Basophils Relative 0 %   Basophils Absolute 0.0 0.0 - 0.1 K/uL   Immature Granulocytes 0 %   Abs Immature Granulocytes 0.04 0.00 - 0.07 K/uL    Comment: Performed at [facility], [place] [address], [place], [place] [address]    Ct Abdomen Pelvis W Contrast  Result Date: 01/08/2019 CLINICAL DATA:  Diffuse abdomen pain since last night. EXAM: CT ABDOMEN AND PELVIS WITH CONTRAST TECHNIQUE: Multidetector CT imaging of the abdomen and pelvis was performed using the standard protocol following bolus administration of intravenous contrast.  CONTRAST:  162m OMNIPAQUE IOHEXOL 300 MG/ML  SOLN COMPARISON:  December 29, 2018 FINDINGS: Lower chest: Mild atelectasis of bilateral lung bases are noted. The heart size is normal. Hepatobiliary: No focal liver lesions identified. The gallbladder is not definitely seen. There is probable dilatation of common bowel duct measuring 8.6 mm. Pancreas: There is pancreatic ductal dilatation measuring 6 mm. No focal lesion is identified within the pancreas. Spleen: Normal in size without focal abnormality. Adrenals/Urinary Tract: The bilateral adrenal glands are normal. Bilateral kidney cysts are noted. There is no hydronephrosis bilaterally. Bladder is partially decompressed. The calcifications are identified in the posterior left bladder lumen. Stomach/Bowel: There are multiple dilated small bowel loops in the right upper quadrant,  right mid abdomen, mid abdomen and lower abdomen likely secondary to an incarcerated dilated bowel loop in a left lower anterior abdominal hernia. The hernia measures 16.5 x 7.6 x 17.6 cm and contain multiple bowel loops and mesenteric fat. There is a small hiatal hernia. The stomach is otherwise normal. A right lower quadrant ostomy is identified. Vascular/Lymphatic: No significant vascular findings are present. No enlarged abdominal or pelvic lymph nodes. Reproductive: Prostate is unremarkable. Other: None. Musculoskeletal:  Degenerative joint changes of the spine are noted. IMPRESSION: Multiple dilated small bowel loops in the abdomen and pelvis likely secondary to an incarcerated dilated bowel loop in the left lower abdomen hernia. The hernia measures 16.5 x 7.6 x 17.6 cm and contain multiple bowel loops and mesenteric fat. A right lower quadrant ostomy is identified. These results will be called to the ordering clinician or representative by the Radiologist Assistant, and communication documented in the PACS or zVision Dashboard. Electronically Signed   By: WAbelardo DieselM.D.   On: 01/08/2019 16:13   A/P: Daniel DEGNANis an 63y.o. male with Crohn's disease, castrate resistant metastatic prostate cancer here with recurrent partial sbo and undefined transition point; multiple prior admissions for this in the past with subsequent resolution  -He is having active ostomy output -I discussed everything with him, his wife and friend today - he is well aware of his symptoms and feels nothing is different this go round compared to previous. He would like to avoid surgery given previous success with resolution without in past and I agree this is reasonable. -NPO, NGT, small bowel protocol -We will follow with you  CSharon Mt WDema Severin M.D. [facility]

## 2019-01-09 DIAGNOSIS — K50819 Crohn's disease of both small and large intestine with unspecified complications: Secondary | ICD-10-CM

## 2019-01-09 DIAGNOSIS — C7951 Secondary malignant neoplasm of bone: Secondary | ICD-10-CM

## 2019-01-09 DIAGNOSIS — K460 Unspecified abdominal hernia with obstruction, without gangrene: Secondary | ICD-10-CM

## 2019-01-09 DIAGNOSIS — K56600 Partial intestinal obstruction, unspecified as to cause: Secondary | ICD-10-CM

## 2019-01-09 DIAGNOSIS — Z515 Encounter for palliative care: Secondary | ICD-10-CM

## 2019-01-09 DIAGNOSIS — C61 Malignant neoplasm of prostate: Secondary | ICD-10-CM

## 2019-01-09 LAB — COMPREHENSIVE METABOLIC PANEL
ALBUMIN: 3 g/dL — AB (ref 3.5–5.0)
ALT: 20 U/L (ref 0–44)
AST: 25 U/L (ref 15–41)
Alkaline Phosphatase: 52 U/L (ref 38–126)
Anion gap: 11 (ref 5–15)
BUN: 9 mg/dL (ref 6–20)
CHLORIDE: 104 mmol/L (ref 98–111)
CO2: 25 mmol/L (ref 22–32)
Calcium: 7.8 mg/dL — ABNORMAL LOW (ref 8.9–10.3)
Creatinine, Ser: 0.98 mg/dL (ref 0.61–1.24)
GFR calc Af Amer: >60 mL/min (ref >60)
GFR calc non Af Amer: >60 mL/min (ref >60)
Glucose, Bld: 153 mg/dL — ABNORMAL HIGH (ref 70–99)
POTASSIUM: 3.5 mmol/L (ref 3.5–5.1)
Sodium: 140 mmol/L (ref 135–145)
Total Bilirubin: 0.7 mg/dL (ref 0.3–1.2)
Total Protein: 7.1 g/dL (ref 6.5–8.1)

## 2019-01-09 LAB — CBC
HEMATOCRIT: 37.1 % — AB (ref 39.0–52.0)
HEMOGLOBIN: 12.1 g/dL — AB (ref 13.0–17.0)
MCH: 30.6 pg (ref 26.0–34.0)
MCHC: 32.6 g/dL (ref 30.0–36.0)
MCV: 93.9 fL (ref 80.0–100.0)
Platelets: 423 10*3/uL — ABNORMAL HIGH (ref 150–400)
RBC: 3.95 MIL/uL — ABNORMAL LOW (ref 4.22–5.81)
RDW: 12.5 % (ref 11.5–15.5)
WBC: 13.1 10*3/uL — ABNORMAL HIGH (ref 4.0–10.5)
nRBC: 0 % (ref 0.0–0.2)

## 2019-01-09 MED ORDER — KCL IN DEXTROSE-NACL 40-5-0.45 MEQ/L-%-% IV SOLN
INTRAVENOUS | Status: DC
  Administered 2019-01-09 – 2019-01-14 (×9): via INTRAVENOUS
  Filled 2019-01-09 (×11): qty 1000

## 2019-01-09 MED ORDER — MORPHINE SULFATE (PF) 2 MG/ML IV SOLN
2.0000 mg | INTRAVENOUS | Status: DC | PRN
  Administered 2019-01-09 (×3): 2 mg via INTRAVENOUS
  Filled 2019-01-09 (×3): qty 1

## 2019-01-09 MED ORDER — KETOROLAC TROMETHAMINE 30 MG/ML IJ SOLN: 30.0000 mg | Freq: Four times a day (QID) | INTRAMUSCULAR | Status: DC | PRN

## 2019-01-09 MED ORDER — DEXAMETHASONE SODIUM PHOSPHATE 4 MG/ML IJ SOLN
4.0000 mg | Freq: Once | INTRAMUSCULAR | Status: AC
  Administered 2019-01-09: 4 mg via INTRAVENOUS
  Filled 2019-01-09: qty 1

## 2019-01-09 MED ORDER — KETOROLAC TROMETHAMINE 15 MG/ML IJ SOLN
15.0000 mg | Freq: Once | INTRAMUSCULAR | Status: AC
  Administered 2019-01-09: 15 mg via INTRAVENOUS
  Filled 2019-01-09: qty 1

## 2019-01-09 MED ORDER — POTASSIUM CHLORIDE 2 MEQ/ML IV SOLN
INTRAVENOUS | Status: DC
  Filled 2019-01-09: qty 1000

## 2019-01-09 MED ORDER — DEXAMETHASONE SODIUM PHOSPHATE 4 MG/ML IJ SOLN
2.0000 mg | INTRAMUSCULAR | Status: AC
  Administered 2019-01-09 – 2019-01-11 (×3): 2 mg via INTRAVENOUS
  Filled 2019-01-09 (×3): qty 1

## 2019-01-09 MED ORDER — HYDROMORPHONE HCL 1 MG/ML IJ SOLN
0.5000 mg | INTRAMUSCULAR | Status: DC | PRN
  Administered 2019-01-09 – 2019-01-10 (×4): 0.5 mg via INTRAVENOUS
  Filled 2019-01-09 (×4): qty 1

## 2019-01-09 MED ORDER — MAGNESIUM SULFATE 2 GM/50ML IV SOLN
2.0000 g | Freq: Once | INTRAVENOUS | Status: AC
  Administered 2019-01-09: 2 g via INTRAVENOUS
  Filled 2019-01-09: qty 50

## 2019-01-09 NOTE — Consult Note (Addendum)
Consultation Note Date: 01/09/2019   Patient Name: Daniel Howard  DOB: April 11, 1956  MRN: [id number]  Age / Sex: 63 y.o., male  PCP: Alycia Rossetti, MD Referring Physician: Aileen Fass, Tammi Klippel, MD  Reason for Consultation: Establishing goals of care, Non pain symptom management and Pain control  HPI/Patient Profile: 63 y.o. male  with past medical history of metastatic prostate cancer (active treatment), duodenal ulcer, protein calorie malnutrition, and Crohn Disease who was admitted on 01/08/2019 with recurrent SBO. Patient presented to the ED on 01/08/2019 with worsening abdominal pain without vomiting. He reports the pain is consistent with other episodes of SBO. CT showed "multiple dilated small bowel loops in the abdomen and pelvis likely secondary to incarcerated dilated bowel loop in the left lower abdomen hernia". This CT was relatively unchanged from previous CT this month per surgery. Surgery is following the patient and recommends medical management at this time. He was recently discharged 01/02/2019 for SBO.  Patient has had 5 admits in the past 6 months and 8 admits in the past 10 months for SBO.  Clinical Assessment and Goals of Care:  I have reviewed medical records including EPIC notes, labs and imaging, assessed the patient and then met at the bedside to discuss diagnosis prognosis, GOC, EOL wishes, disposition and options.  I introduced Palliative Medicine as specialized medical care for people living with serious illness. It focuses on providing relief from the symptoms and stress of a serious illness. The goal is to improve quality of life for both the patient and the family.  Upon arrival to the bedside, patient is sitting up on the side of the bed, leaning against the bed rail for support. He states this position is more comfortable than laying down. He reports he has crampy, sharp abdominal  pain below his umbilicus, and back pain.   We discussed a brief life review of the patient. 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 He is very close to his wife, [name], whom he lives with. He has 4 boys ages 8, 9, 16, and 13. He tells me that his wife is his Scientist, forensic, doctor, boss, and support system". He states that his wife takes very good care of him. He reports he tries to hide his pain, but that his wife can still tell he is in pain, which prompts her to bring him to the hospital.   His wife seems to be driving the majority of his care. He tells me he is a spiritual person as his wife is an Physiological scientist.   As far as functional and nutritional status he states he has lost nearly 25 lbs over the past few months. He states he eats, but not much because he has to be on a special diet due to his bowel problems. Mr. Bieler likes to stay busy but remarks that he needs to get his strength up.   We discussed his current illness and what it means in the larger context of his on-going  co-morbidities.  Natural disease trajectory and expectations at EOL were discussed. We discussed how the bowel obstruction with incarcerated hernia is recurrent and how he is not a surgical candidate. He says he understands why he is not a surgical candidate due to his crohn's disease and cancer. He states, "I just miss a few meals and get it right back", when I asked how he felt about his recurrent SBO.  I attempted to elicit values and goals of care important to the patient.  He is a spiritual person and states that, "God makes no mistakes".   The difference between aggressive medical intervention and comfort care was considered in light of the patient's goals of care. He would like to discuss this further with his wife present. He did confirm that he would want CPR performed if the situation arises.   Advanced directives, concepts specific to code status, artifical  feeding and hydration, and rehospitalization were considered and discussed. Patient states that he likes spending time with his family. It seems at this time he would like to continue receiving hospitalization for this recurrent bowel problem  Questions and concerns were addressed.      Primary Decision Maker:  PATIENT  Next of Kin: Wife, Jatana    SUMMARY OF RECOMMENDATIONS    Code Status/Advance Care Planning:  Full code/Full scope of treatment  Mr. Sonneborn would like to speak to his wife about code status before making any decision  Follow up meeting with patient and his wife to further discuss code status and goals of care   Symptom Management:   Decadron for bowel inflammation, especially in setting of Crohn's   Agree with Toradol; will add PRN x 3 days  D/c Morphine  Dilaudid 0.24m q2h PRN for pain  Additional Recommendations (Limitations, Scope, Preferences):  Full Scope Treatment  Palliative Prophylaxis:   Bowel Regimen and Frequent Pain Assessment  Psycho-social/Spiritual:   Desire for further Chaplaincy support: Yes  Prognosis:   <6 months given metastatic prostate cancer with recurrent bowel obstruction with incarcerated hernia. High risk for acute event with rehospitalization and death due to protein calorie malnutrition and co-morbidities  Discharge Planning: To Be Determined, further goals of care conversation to be held with wife       Primary Diagnoses: Present on Admission: . Crohn's disease of both small and large intestine with complication ([facility] . Partial small bowel obstruction ([facility] . SBO (small bowel obstruction) ([facility] . Prostate cancer metastatic to bone ([facility]   I have reviewed the medical record, interviewed the patient and family, and examined the patient. The following aspects are pertinent.  Past Medical History:  Diagnosis Date  . Abnormal finding of biliary tract    MRCP shows pancreatic/biliary tract dilation. EUS 2010  confirmed dilation but no chronic pancreaitis or mass. Vascular ectasia crimpoing distal CBD.   .Marland KitchenAnxiety   . 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 Duodenal ulcer 2010   nsaids  . History of blood transfusion 1992   "related to colon OR"  . Peristomal hernia   . Prostate cancer ([facility] 2018  . SBO (small bowel obstruction) ([facility] 11/10/2018  . Small bowel obstruction ([facility] 10/2017; 11/21/2017; 02/16/2018  . Spigelian hernia  bilateral   Social History   Socioeconomic History  . Marital status: Married    Spouse name: Not on file  . Number of children: 46  . Years of education: Not on file  . Highest education level: Not on file  Occupational History  . Occupation: disabled    Fish farm manager: UNEMPLOYED  Social Needs  . Financial resource strain: Not on file  . Food insecurity:    Worry: Not on file    Inability: Not on file  . Transportation needs:    Medical: Not on file    Non-medical: Not on file  Tobacco Use  . Smoking status: Former Smoker    Packs/day: 0.75    Years: 32.00    Pack years: 24.00    Types: Cigarettes    Last attempt to quit: 12/21/2009    Years since quitting: 9.0  . Smokeless tobacco: Never Used  Substance and Sexual Activity  . Alcohol use: No    Alcohol/week: 0.0 standard drinks    Comment: Former drinker  . Drug use: No  . Sexual activity: Not Currently    Partners: Female  Lifestyle  . Physical activity:    Days per week: Not on file    Minutes per session: Not on file  . Stress: Not on file  Relationships  . Social connections:    Talks on phone: Not on file    Gets together: Not on file    Attends  religious service: Not on file    Active member of club or organization: Not on file    Attends meetings of clubs or organizations: Not on file    Relationship status: Not on file  Other Topics Concern  . Not on file  Social History Narrative  . Not on file   Family History  Problem Relation Age of Onset  . Cancer Father        prostate   . Prostate cancer Father   . Colon cancer Father 67  . Hypertension Sister   . Cancer Sister   . Depression Sister   . Breast cancer Sister   . COPD Sister   . Aneurysm Brother        deceased, brain aneurysm   Scheduled Meds: . [START ON 01/12/2019] Adalimumab  40 mg Subcutaneous Q Wed  . budesonide  6 mg Oral Daily  . enoxaparin (LOVENOX) injection  40 mg Subcutaneous Q24H   Continuous Infusions: . dextrose 5 % and 0.45 % NaCl with KCl 40 mEq/L 75 mL/hr at 01/09/19 1038  . famotidine (PEPCID) IV 20 mg (01/09/19 0828)   PRN Meds:.lidocaine-prilocaine, morphine injection, ondansetron **OR** ondansetron (ZOFRAN) IV Allergies  Allergen Reactions  . Penicillins Hives    Has patient had a PCN reaction causing immediate rash, facial/tongue/throat swelling, SOB or lightheadedness with hypotension: Yes Has patient had a PCN reaction causing severe rash involving mucus membranes or skin necrosis: No Has patient had a PCN reaction that required hospitalization: No Has patient had a PCN reaction occurring within the last 10 years: No If all of the above answers are "NO", then may proceed with Cephalosporin use.    Review of Systems fatigue, weakness, weight loss, abdominal pain, back pain.  Physical Exam  General: cachetic, elderly male in no acute distress, A&O with clear speech, pleasant. Extremities: No edema noted bilaterally  Vital Signs: BP 123/75 (BP Location: Right Arm)   Pulse 91   Temp 98.8 F (37.1 C) (Oral)   Resp 18   Ht  5' 4.5" (1.638 m)   Wt 58.9 kg   SpO2 100%   BMI 21.94 kg/m  Pain Scale: 0-10 POSS *See Group  Information*: 1-Acceptable,Awake and alert Pain Score: Asleep   SpO2: SpO2: 100 % O2 Device:SpO2: 100 % O2 Flow Rate: .   IO: Intake/output summary:   Intake/Output Summary (Last 24 hours) at 01/09/2019 1447 Last data filed at 01/09/2019 1300 Gross per 24 hour  Intake 1826.81 ml  Output 700 ml  Net 1126.81 ml    LBM: Last BM Date: 01/09/19 Baseline Weight: Weight: 57.6 kg Most recent weight: Weight: 58.9 kg     Palliative Assessment/Data:40%     Time In: 3:50pm Time Out: 4:40pm Time Total: 50 minutes Greater than 50%  of this time was spent counseling and coordinating care related to the above assessment and plan.   Ferrel Logan, PA-S 01/09/2019   Signed by: Florentina Jenny, PA-C Palliative Medicine Pager: [phone]  Please contact Palliative Medicine Team phone at [phone] for questions and concerns.  For individual provider: See Shea Evans

## 2019-01-09 NOTE — Progress Notes (Addendum)
[facility] PROGRESS NOTE    Progress Note  Daniel Howard  ENI:[id number] DOB: [birth year] DOA: 01/08/2019 PCP: Alycia Rossetti, MD     Brief Narrative:   Daniel Howard is an 63 y.o. male past medical history significant for complicated Crohn's disease, status post multiple surgeries including partial colectomy with colostomy in place, recurrent partial small bowel obstruction, with history of metastatic prostate cancer and protein caloric malnutrition comes into the hospital for abdominal crampy abdominal pain that started the night prior to admission CT scan of the abdomen and pelvis was done that showed a small bowel obstruction with secondary incarcerated hernia.  Surgery was consulted and a CT scan were unchanged from previous month they recommended no surgical intervention and conservative management.  Assessment/Plan:   Partial small bowel obstruction (HCC)/incarcerated hernia in the setting of Crohn's disease: NG tube in place, the patient was placed n.p.o. on IV fluids. Replete electrolytes as needed we will try to keep magnesium above 2 potassium above 4. Appreciate surgery's assistance, further management per surgery. About 1100 cc positive, with 300 of urine, about 200 from NG tube. He is putting good amount through his colostomy ostomy. Question of metastatic diseases contributing to his abdominal pain.   Crohn's disease of both small and large intestine with complication ([place]) With a colostomy in place   History of Advance metastatic prostate cancer to the bone: Hold antineoplastic orally. 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 I will consult hospice and palliative care to address goals of care.  DVT prophylaxis: Lovenox Family Communication:none Disposition Plan/Barrier to D/C: unable to determine Code  Status:     Code Status Orders  (From admission, onward)         Start     Ordered   01/08/19 1837  Full code  Continuous     01/08/19 1836        Code Status History    Date Active Date Inactive Code Status Order ID Comments User Context   12/29/2018 2137 01/02/2019 1821 Full Code [id number]  Barb Merino, MD ED   12/03/2018 0038 12/03/2018 1648 Partial Code [id number]  Lenore Cordia, MD ED   11/10/2018 1545 11/16/2018 1901 Full Code [id number]  Janora Norlander, MD ED   11/01/2018 1954 11/03/2018 2301 Full Code [id number]  Etta Quill, DO ED   09/30/2018 1443 10/02/2018 1412 Full Code [id number]  Bonnell Public, MD Inpatient   07/03/2018 0435 07/06/2018 1834 Full Code [id number]  Ivor Costa, MD ED   04/17/2018 0841 04/19/2018 1613 Full Code [id number]  Rondel Jumbo, PA-C ED   03/16/2018 2317 03/19/2018 1657 Full Code [id number]  Vianne Bulls, MD ED   02/17/2018 0103 02/18/2018 1539 Full Code [id number]  Rise Patience, MD ED   11/22/2017 0151 11/26/2017 1718 Full Code [id number]  Roxan Hockey, MD ED   11/14/2017 2348 11/16/2017 1953 Full Code [id number]  Florencia Reasons, MD Inpatient   09/04/2016 1541 09/09/2016 1620 Full Code [id number]  Samella Parr, NP Inpatient   07/26/2014 2147 07/28/2014 2238 Full Code [id number]  Thurnell Lose, MD Inpatient   02/06/2012 0021 02/08/2012 1442 Full Code [id number]  Pasty Arch, RN Inpatient        IV Access:    Peripheral IV   Procedures and diagnostic studies:   Ct Abdomen Pelvis W Contrast  Result Date: 01/08/2019  CLINICAL DATA:  Diffuse abdomen pain since last night. EXAM: CT ABDOMEN AND PELVIS WITH CONTRAST TECHNIQUE: Multidetector CT imaging of the abdomen and pelvis was performed using the standard protocol following bolus administration of intravenous contrast. CONTRAST:  134m OMNIPAQUE IOHEXOL 300 MG/ML  SOLN COMPARISON:  December 29, 2018 FINDINGS: Lower chest: Mild atelectasis of bilateral lung bases are noted. The  heart size is normal. Hepatobiliary: No focal liver lesions identified. The gallbladder is not definitely seen. There is probable dilatation of common bowel duct measuring 8.6 mm. Pancreas: There is pancreatic ductal dilatation measuring 6 mm. No focal lesion is identified within the pancreas. Spleen: Normal in size without focal abnormality. Adrenals/Urinary Tract: The bilateral adrenal glands are normal. Bilateral kidney cysts are noted. There is no hydronephrosis bilaterally. Bladder is partially decompressed. The calcifications are identified in the posterior left bladder lumen. Stomach/Bowel: There are multiple dilated small bowel loops in the right upper quadrant, right mid abdomen, mid abdomen and lower abdomen likely secondary to an incarcerated dilated bowel loop in a left lower anterior abdominal hernia. The hernia measures 16.5 x 7.6 x 17.6 cm and contain multiple bowel loops and mesenteric fat. There is a small hiatal hernia. The stomach is otherwise normal. A right lower quadrant ostomy is identified. Vascular/Lymphatic: No significant vascular findings are present. No enlarged abdominal or pelvic lymph nodes. Reproductive: Prostate is unremarkable. Other: None. Musculoskeletal:  Degenerative joint changes of the spine are noted. IMPRESSION: Multiple dilated small bowel loops in the abdomen and pelvis likely secondary to an incarcerated dilated bowel loop in the left lower abdomen hernia. The hernia measures 16.5 x 7.6 x 17.6 cm and contain multiple bowel loops and mesenteric fat. A right lower quadrant ostomy is identified. These results will be called to the ordering clinician or representative by the Radiologist Assistant, and communication documented in the PACS or zVision Dashboard. Electronically Signed   By: WAbelardo DieselM.D.   On: 01/08/2019 16:13   Dg Abd Portable 1 View  Result Date: 01/08/2019 CLINICAL DATA:  Evaluate NG tube placement. EXAM: PORTABLE ABDOMEN - 1 VIEW COMPARISON:  CT  scan January 08, 2019 FINDINGS: A right lower quadrant ostomy is noted. Renal collecting systems are nondilated. The NG tube terminates in the left upper quadrant, within the stomach. IMPRESSION: The NG tube is in good position. Electronically Signed   By: DDorise BullionIII M.D   On: 01/08/2019 18:50     Medical Consultants:    None.  Anti-Infectives:     Subjective:    CDEZ STAUFFERrelates he continues to have abdominal pain.  Objective:    Vitals:   01/08/19 1845 01/08/19 2040 01/09/19 0500 01/09/19 0513  BP: 123/88 123/83  117/80  Pulse: 87 96  93  Resp: 16 18  18   Temp: 98.7 F (37.1 C) 99.6 F (37.6 C)  98.5 F (36.9 C)  TempSrc: Oral Oral  Oral  SpO2: 99% 98%  100%  Weight: 58.9 kg  58.9 kg   Height: 5' 4.5" (1.638 m)       Intake/Output Summary (Last 24 hours) at 01/09/2019 0803 Last data filed at 01/09/2019 0300 Gross per 24 hour  Intake 1626.81 ml  Output 500 ml  Net 1126.81 ml   Filed Weights   01/08/19 1235 01/08/19 1845 01/09/19 0500  Weight: 57.6 kg 58.9 kg 58.9 kg    Exam: General exam: Cachectic appearing disheveled Respiratory system: Good air movement and clear to auscultation. Cardiovascular system: Rate and rhythm with  positive S1-S2 no murmurs rubs gallops, no JVD Gastrointestinal system: Diminished distended, with multiple scars, with diffuse tenderness but no rebound or guarding Extremities: No pedal edema. Skin: No rashes, lesions or ulcers Psychiatry: Judgement and insight appear normal. Mood & affect appropriate.    Data Reviewed:    Labs: Basic Metabolic Panel: Recent Labs  Lab 01/08/19 1423 01/09/19 0418  NA 139 140  K 3.8 3.5  CL 101 104  CO2 28 25  GLUCOSE 101* 153*  BUN 9 9  CREATININE 1.01 0.98  CALCIUM 8.1* 7.8*   GFR Estimated Creatinine Clearance: 66.8 mL/min (by C-G formula based on SCr of 0.98 mg/dL). Liver Function Tests: Recent Labs  Lab 01/08/19 1423 01/09/19 0418  AST 23 25  ALT 19 20    ALKPHOS 50 52  BILITOT 0.8 0.7  PROT 7.1 7.1  ALBUMIN 3.0* 3.0*   Recent Labs  Lab 01/08/19 1423  LIPASE 28   No results for input(s): AMMONIA in the last 168 hours. Coagulation profile No results for input(s): INR, PROTIME in the last 168 hours.  CBC: Recent Labs  Lab 01/08/19 1423 01/09/19 0418  WBC 10.7* 13.1*  NEUTROABS 8.1*  --   HGB 11.5* 12.1*  HCT 35.3* 37.1*  MCV 97.0 93.9  PLT 411* 423*   Cardiac Enzymes: No results for input(s): CKTOTAL, CKMB, CKMBINDEX, TROPONINI in the last 168 hours. BNP (last 3 results) No results for input(s): PROBNP in the last 8760 hours. CBG: No results for input(s): GLUCAP in the last 168 hours. D-Dimer: No results for input(s): DDIMER in the last 72 hours. Hgb A1c: No results for input(s): HGBA1C in the last 72 hours. Lipid Profile: No results for input(s): CHOL, HDL, LDLCALC, TRIG, CHOLHDL, LDLDIRECT in the last 72 hours. Thyroid function studies: No results for input(s): TSH, T4TOTAL, T3FREE, THYROIDAB in the last 72 hours.  Invalid input(s): FREET3 Anemia work up: No results for input(s): VITAMINB12, FOLATE, FERRITIN, TIBC, IRON, RETICCTPCT in the last 72 hours. Sepsis Labs: Recent Labs  Lab 01/08/19 1423 01/09/19 0418  WBC 10.7* 13.1*   Microbiology No results found for this or any previous visit (from the past 240 hour(s)).   Medications:   . [START ON 01/12/2019] Adalimumab  40 mg Subcutaneous Q Wed  . budesonide  6 mg Oral Daily  . enoxaparin (LOVENOX) injection  40 mg Subcutaneous Q24H  . enzalutamide  160 mg Oral Daily   Continuous Infusions: . dextrose 5 % and 0.9 % NaCl with KCl 20 mEq/L 125 mL/hr at 01/09/19 0300  . famotidine (PEPCID) IV Stopped (01/08/19 2307)     LOS: 1 day   Charlynne Cousins  [facility]   *Please refer to [email or web address], password [email or web address] to get updated schedule on who will round on this patient, as hospitalists switch teams weekly. If 7PM-7AM, please contact  night-coverage at [email or web address], password [email or web address] for any overnight needs.  01/09/2019, 8:03 AM

## 2019-01-09 NOTE — Progress Notes (Signed)
Pt. complaining of pain unrelieved by morphine. Paged Blount NP for pain medication. Received an order for a one time dose of fentanyl. Administered to pt. Will continue to monitor.

## 2019-01-09 NOTE — Plan of Care (Signed)
  Problem: Education: Goal: Knowledge of General Education information will improve Description Including pain rating scale, medication(s)/side effects and non-pharmacologic comfort measures Outcome: Progressing   Problem: Health Behavior/Discharge Planning: Goal: Ability to manage health-related needs will improve Outcome: Progressing   Problem: Clinical Measurements: Goal: Ability to maintain clinical measurements within normal limits will improve Outcome: Progressing Goal: Will remain free from infection Outcome: Progressing   Problem: Activity: Goal: Risk for activity intolerance will decrease Outcome: Progressing   Problem: Coping: Goal: Level of anxiety will decrease Outcome: Progressing   Problem: Pain Managment: Goal: General experience of comfort will improve Outcome: Progressing   Problem: Safety: Goal: Ability to remain free from injury will improve Outcome: Progressing   Problem: Skin Integrity: Goal: Risk for impaired skin integrity will decrease Outcome: Progressing

## 2019-01-09 NOTE — Progress Notes (Signed)
Messaged Traid per [facility] asking for Kpad order to place on abdomen to allievate pain and discomfort.

## 2019-01-09 NOTE — Progress Notes (Signed)
Patient ID: Daniel Howard, male   DOB: 1956/10/06, 63 y.o.   MRN: [id number] [facility] Progress Note:   * No surgery found *  Subjective: Mental status is alert and in pain Objective: Vital signs in last 24 hours: Temp:  [98.3 F (36.8 C)-99.6 F (37.6 C)] 98.5 F (36.9 C) (01/19 0513) Pulse Rate:  [77-96] 93 (01/19 0513) Resp:  [14-18] 18 (01/19 0513) BP: (115-125)/(70-88) 117/80 (01/19 0513) SpO2:  [97 %-100 %] 100 % (01/19 0513) Weight:  [57.6 kg-58.9 kg] 58.9 kg (01/19 0500)  Intake/Output from previous day: 01/18 0701 - 01/19 0700 In: 1626.8 [I.V.:576.8; IV Piggyback:1050] Out: 500 [Urine:300; Emesis/NG output:200] Intake/Output this shift: Total I/O In: 200 [I.V.:200] Out: 200 [Urine:200]  Physical Exam: Work of breathing is not labored.  Ostomy putting out;  NG with brown output  Lab Results:  Results for orders placed or performed during the hospital encounter of 01/08/19 (from the past 48 hour(s))  Lipase, blood     Status: None   Collection Time: 01/08/19  2:23 PM  Result Value Ref Range   Lipase 28 11 - 51 U/L    Comment: Performed at [facility], [place] [address] [place], [place] [address]  Comprehensive metabolic panel     Status: Abnormal   Collection Time: 01/08/19  2:23 PM  Result Value Ref Range   Sodium 139 135 - 145 mmol/L   Potassium 3.8 3.5 - 5.1 mmol/L   Chloride 101 98 - 111 mmol/L   CO2 28 22 - 32 mmol/L   Glucose, Bld 101 (H) 70 - 99 mg/dL   BUN 9 6 - 20 mg/dL   Creatinine, Ser 1.01 0.61 - 1.24 mg/dL   Calcium 8.1 (L) 8.9 - 10.3 mg/dL   Total Protein 7.1 6.5 - 8.1 g/dL   Albumin 3.0 (L) 3.5 - 5.0 g/dL   AST 23 15 - 41 U/L   ALT 19 0 - 44 U/L   Alkaline Phosphatase 50 38 - 126 U/L   Total Bilirubin 0.8 0.3 - 1.2 mg/dL   GFR calc non Af Amer >60 >60 mL/min   GFR calc Af Amer >60 >60 mL/min   Anion gap 10 5 - 15    Comment: Performed at [facility] [address], [place], [place] [address]  CBC with  Differential     Status: Abnormal   Collection Time: 01/08/19  2:23 PM  Result Value Ref Range   WBC 10.7 (H) 4.0 - 10.5 K/uL   RBC 3.64 (L) 4.22 - 5.81 MIL/uL   Hemoglobin 11.5 (L) 13.0 - 17.0 g/dL   HCT 35.3 (L) 39.0 - 52.0 %   MCV 97.0 80.0 - 100.0 fL   MCH 31.6 26.0 - 34.0 pg   MCHC 32.6 30.0 - 36.0 g/dL   RDW 12.6 11.5 - 15.5 %   Platelets 411 (H) 150 - 400 K/uL   nRBC 0.0 0.0 - 0.2 %   Neutrophils Relative % 75 %   Neutro Abs 8.1 (H) 1.7 - 7.7 K/uL   Lymphocytes Relative 17 %   Lymphs Abs 1.8 0.7 - 4.0 K/uL   Monocytes Relative 7 %   Monocytes Absolute 0.7 0.1 - 1.0 K/uL   Eosinophils Relative 1 %   Eosinophils Absolute 0.1 0.0 - 0.5 K/uL   Basophils Relative 0 %   Basophils Absolute 0.0 0.0 - 0.1 K/uL   Immature Granulocytes 0 %   Abs Immature Granulocytes 0.04 0.00 - 0.07  K/uL    Comment: Performed at [facility], [place] [address] [place], [place] [address]  Comprehensive metabolic panel     Status: Abnormal   Collection Time: 01/09/19  4:18 AM  Result Value Ref Range   Sodium 140 135 - 145 mmol/L   Potassium 3.5 3.5 - 5.1 mmol/L   Chloride 104 98 - 111 mmol/L   CO2 25 22 - 32 mmol/L   Glucose, Bld 153 (H) 70 - 99 mg/dL   BUN 9 6 - 20 mg/dL   Creatinine, Ser 0.98 0.61 - 1.24 mg/dL   Calcium 7.8 (L) 8.9 - 10.3 mg/dL   Total Protein 7.1 6.5 - 8.1 g/dL   Albumin 3.0 (L) 3.5 - 5.0 g/dL   AST 25 15 - 41 U/L   ALT 20 0 - 44 U/L   Alkaline Phosphatase 52 38 - 126 U/L   Total Bilirubin 0.7 0.3 - 1.2 mg/dL   GFR calc non Af Amer >60 >60 mL/min   GFR calc Af Amer >60 >60 mL/min   Anion gap 11 5 - 15    Comment: Performed at [facility], [place] [address] [place], [place] [address]  CBC     Status: Abnormal   Collection Time: 01/09/19  4:18 AM  Result Value Ref Range   WBC 13.1 (H) 4.0 - 10.5 K/uL   RBC 3.95 (L) 4.22 - 5.81 MIL/uL   Hemoglobin 12.1 (L) 13.0 - 17.0 g/dL   HCT 37.1 (L) 39.0 - 52.0 %   MCV 93.9 80.0 - 100.0 fL   MCH 30.6 26.0 - 34.0 pg    MCHC 32.6 30.0 - 36.0 g/dL   RDW 12.5 11.5 - 15.5 %   Platelets 423 (H) 150 - 400 K/uL   nRBC 0.0 0.0 - 0.2 %    Comment: Performed at [facility], [place] [address], [place], [place] [address]    Radiology/Results: Ct Abdomen Pelvis W Contrast  Result Date: 01/08/2019 CLINICAL DATA:  Diffuse abdomen pain since last night. EXAM: CT ABDOMEN AND PELVIS WITH CONTRAST TECHNIQUE: Multidetector CT imaging of the abdomen and pelvis was performed using the standard protocol following bolus administration of intravenous contrast. CONTRAST:  126m OMNIPAQUE IOHEXOL 300 MG/ML  SOLN COMPARISON:  December 29, 2018 FINDINGS: Lower chest: Mild atelectasis of bilateral lung bases are noted. The heart size is normal. Hepatobiliary: No focal liver lesions identified. The gallbladder is not definitely seen. There is probable dilatation of common bowel duct measuring 8.6 mm. Pancreas: There is pancreatic ductal dilatation measuring 6 mm. No focal lesion is identified within the pancreas. Spleen: Normal in size without focal abnormality. Adrenals/Urinary Tract: The bilateral adrenal glands are normal. Bilateral kidney cysts are noted. There is no hydronephrosis bilaterally. Bladder is partially decompressed. The calcifications are identified in the posterior left bladder lumen. Stomach/Bowel: There are multiple dilated small bowel loops in the right upper quadrant, right mid abdomen, mid abdomen and lower abdomen likely secondary to an incarcerated dilated bowel loop in a left lower anterior abdominal hernia. The hernia measures 16.5 x 7.6 x 17.6 cm and contain multiple bowel loops and mesenteric fat. There is a small hiatal hernia. The stomach is otherwise normal. A right lower quadrant ostomy is identified. Vascular/Lymphatic: No significant vascular findings are present. No enlarged abdominal or pelvic lymph nodes. Reproductive: Prostate is unremarkable. Other: None. Musculoskeletal:  Degenerative joint changes of the  spine are noted. IMPRESSION: Multiple dilated small bowel loops in the abdomen and  pelvis likely secondary to an incarcerated dilated bowel loop in the left lower abdomen hernia. The hernia measures 16.5 x 7.6 x 17.6 cm and contain multiple bowel loops and mesenteric fat. A right lower quadrant ostomy is identified. These results will be called to the ordering clinician or representative by the Radiologist Assistant, and communication documented in the PACS or zVision Dashboard. Electronically Signed   By: Abelardo Diesel M.D.   On: 01/08/2019 16:13   Dg Abd Portable 1 View  Result Date: 01/08/2019 CLINICAL DATA:  Evaluate NG tube placement. EXAM: PORTABLE ABDOMEN - 1 VIEW COMPARISON:  CT scan January 08, 2019 FINDINGS: A right lower quadrant ostomy is noted. Renal collecting systems are nondilated. The NG tube terminates in the left upper quadrant, within the stomach. IMPRESSION: The NG tube is in good position. Electronically Signed   By: Dorise Bullion III M.D   On: 01/08/2019 18:50    Anti-infectives: Anti-infectives (From admission, onward)   None      Assessment/Plan: Problem List: Patient Active Problem List   Diagnosis Date Noted  . Incarcerated hernia 01/09/2019  . Small bowel obstruction due to adhesions ([place]) 12/29/2018  . Small bowel obstruction ([place]) 12/03/2018  . Partial small bowel obstruction ([place]) 11/01/2018  . SBO (small bowel obstruction) ([place]) 09/30/2018  . Iron deficiency anemia 07/03/2018  . 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 Nausea without vomiting 11/10/2017  . Incisional hernia 09/22/2017  . Abdominal pain of multiple sites 09/22/2017  . Goals of care, counseling/discussion 04/02/2017  . Prostate cancer metastatic to bone ([place]) 04/02/2017  . Elevated PSA 03/04/2017  . Osteopenia 11/03/2016  . Pelvic mass in male 09/04/2016  . 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 Exacerbation of Crohn's disease ([place]) 09/04/2016  . Protein-calorie malnutrition, severe ([place])  07/27/2014  . Loss of weight 06/09/2014  . Crohn's disease of both small and large intestine with complication ([place]) 97/98/9211  . Boils 02/11/2013  . Ventral hernia 12/11/2009  . Anemia 12/03/2009  . Regional enteritis/Crohn's 01/25/2007    Metastatic prostate cancer with pain issues.  I increased the amount and frequency of his morphine to try to ease his pain.  Suggest palliative care consult for this unfortunate man.   * No surgery found *    LOS: 1 day   Matt B. Hassell Done, MD, [facility] [phone] beeper [phone]  01/09/2019 8:34 AM

## 2019-01-10 ENCOUNTER — Encounter: Payer: Self-pay | Admitting: Internal Medicine

## 2019-01-10 ENCOUNTER — Encounter (HOSPITAL_COMMUNITY): Payer: Self-pay | Admitting: General Practice

## 2019-01-10 ENCOUNTER — Telehealth: Payer: Self-pay | Admitting: Gastroenterology

## 2019-01-10 ENCOUNTER — Other Ambulatory Visit: Payer: Self-pay

## 2019-01-10 ENCOUNTER — Other Ambulatory Visit: Payer: Self-pay | Admitting: Oncology

## 2019-01-10 DIAGNOSIS — Z7189 Other specified counseling: Secondary | ICD-10-CM

## 2019-01-10 DIAGNOSIS — C61 Malignant neoplasm of prostate: Secondary | ICD-10-CM

## 2019-01-10 LAB — COMPREHENSIVE METABOLIC PANEL
ALT: 18 U/L (ref 0–44)
AST: 22 U/L (ref 15–41)
Albumin: 3 g/dL — ABNORMAL LOW (ref 3.5–5.0)
Alkaline Phosphatase: 54 U/L (ref 38–126)
Anion gap: 12 (ref 5–15)
BUN: 14 mg/dL (ref 6–20)
CO2: 24 mmol/L (ref 22–32)
Calcium: 7.3 mg/dL — ABNORMAL LOW (ref 8.9–10.3)
Chloride: 103 mmol/L (ref 98–111)
Creatinine, Ser: 0.99 mg/dL (ref 0.61–1.24)
GFR calc Af Amer: >60 mL/min (ref >60)
GFR calc non Af Amer: >60 mL/min (ref >60)
Glucose, Bld: 119 mg/dL — ABNORMAL HIGH (ref 70–99)
Potassium: 4.3 mmol/L (ref 3.5–5.1)
Sodium: 139 mmol/L (ref 135–145)
Total Bilirubin: 1.1 mg/dL (ref 0.3–1.2)
Total Protein: 7.2 g/dL (ref 6.5–8.1)

## 2019-01-10 MED ORDER — HYDROMORPHONE HCL 1 MG/ML IJ SOLN
0.5000 mg | INTRAMUSCULAR | Status: DC | PRN
  Administered 2019-01-10 – 2019-01-11 (×5): 1 mg via INTRAVENOUS
  Filled 2019-01-10 (×6): qty 1

## 2019-01-10 MED ORDER — KETOROLAC TROMETHAMINE 15 MG/ML IJ SOLN
15.0000 mg | Freq: Four times a day (QID) | INTRAMUSCULAR | Status: DC
  Administered 2019-01-10 – 2019-01-11 (×3): 15 mg via INTRAVENOUS
  Filled 2019-01-10 (×3): qty 1

## 2019-01-10 MED ORDER — BOOST / RESOURCE BREEZE PO LIQD CUSTOM
1.0000 | Freq: Three times a day (TID) | ORAL | Status: DC
  Administered 2019-01-10 – 2019-01-14 (×12): 1 via ORAL

## 2019-01-10 MED ORDER — TRAZODONE HCL 50 MG PO TABS
50.0000 mg | ORAL_TABLET | Freq: Every evening | ORAL | Status: DC | PRN
  Administered 2019-01-10 – 2019-01-13 (×4): 50 mg via ORAL
  Filled 2019-01-10 (×4): qty 1

## 2019-01-10 NOTE — Progress Notes (Signed)
[facility] PROGRESS NOTE    Progress Note  Daniel Howard  JAS:[id number] DOB: December 27, 1955 DOA: 01/08/2019 PCP: Alycia Rossetti, MD     Brief Narrative:   Daniel Howard is an 63 y.o. male past medical history significant for complicated Crohn's disease, status post multiple surgeries including partial colectomy with colostomy in place, recurrent partial small bowel obstruction, with history of metastatic prostate cancer and protein caloric malnutrition comes into the hospital for abdominal crampy abdominal pain that started the night prior to admission CT scan of the abdomen and pelvis was done that showed a small bowel obstruction with secondary incarcerated hernia.  Surgery was consulted and a CT scan were unchanged from previous month they recommended no surgical intervention and conservative management.  Assessment/Plan:   Partial small bowel obstruction (HCC)/incarcerated hernia in the setting of Crohn's disease: NG tube in place, through NG tube. Replete electrolytes as needed we will try to keep magnesium above 2 potassium above 4. Appreciate surgery's assistance, further management per surgery. He is putting good amount through his colostomy ostomy. Now on a clear liquid diet.  Crohn's disease of both small and large intestine with complication ([place]) With a colostomy in place   History of Advance metastatic prostate cancer to the bone: Hold antineoplastic orally. 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 Appreciate hospice palliative care assistance.  Further discussion about his CODE STATUS and how to proceed this afternoon with palliative care.  DVT prophylaxis: Lovenox Family Communication:none Disposition Plan/Barrier to D/C: unable to determine Code Status:     Code Status Orders  (From admission, onward)           Start     Ordered   01/08/19 1837  Full code  Continuous     01/08/19 1836        Code Status History    Date Active Date Inactive Code Status Order ID Comments User Context   12/29/2018 2137 01/02/2019 1821 Full Code [id number]  Barb Merino, MD ED   12/03/2018 0038 12/03/2018 1648 Partial Code [id number]  Lenore Cordia, MD ED   11/10/2018 1545 11/16/2018 1901 Full Code [id number]  Janora Norlander, MD ED   11/01/2018 1954 11/03/2018 2301 Full Code [id number]  Etta Quill, DO ED   09/30/2018 1443 10/02/2018 1412 Full Code [id number]  Bonnell Public, MD Inpatient   07/03/2018 0435 07/06/2018 1834 Full Code [id number]  Ivor Costa, MD ED   04/17/2018 0841 04/19/2018 1613 Full Code [id number]  Rondel Jumbo, PA-C ED   03/16/2018 2317 03/19/2018 1657 Full Code [id number]  Vianne Bulls, MD ED   02/17/2018 0103 02/18/2018 1539 Full Code [id number]  Rise Patience, MD ED   11/22/2017 0151 11/26/2017 1718 Full Code [id number]  Roxan Hockey, MD ED   11/14/2017 2348 11/16/2017 1953 Full Code [id number]  Florencia Reasons, MD Inpatient   09/04/2016 1541 09/09/2016 1620 Full Code [id number]  Samella Parr, NP Inpatient   07/26/2014 2147 07/28/2014 2238 Full Code [id number]  Thurnell Lose, MD Inpatient   02/06/2012 0021 02/08/2012 1442 Full Code [id number]  Pasty Arch, RN Inpatient        IV Access:    Peripheral IV   Procedures and diagnostic studies:   Ct Abdomen Pelvis W Contrast  Result Date: 01/08/2019 CLINICAL DATA:  Diffuse abdomen pain since last night. EXAM: CT ABDOMEN  AND PELVIS WITH CONTRAST TECHNIQUE: Multidetector CT imaging of the abdomen and pelvis was performed using the standard protocol following bolus administration of intravenous contrast. CONTRAST:  163m OMNIPAQUE IOHEXOL 300 MG/ML  SOLN COMPARISON:  December 29, 2018 FINDINGS: Lower chest: Mild atelectasis of bilateral lung bases are noted. The heart size is normal. Hepatobiliary: No focal liver lesions  identified. The gallbladder is not definitely seen. There is probable dilatation of common bowel duct measuring 8.6 mm. Pancreas: There is pancreatic ductal dilatation measuring 6 mm. No focal lesion is identified within the pancreas. Spleen: Normal in size without focal abnormality. Adrenals/Urinary Tract: The bilateral adrenal glands are normal. Bilateral kidney cysts are noted. There is no hydronephrosis bilaterally. Bladder is partially decompressed. The calcifications are identified in the posterior left bladder lumen. Stomach/Bowel: There are multiple dilated small bowel loops in the right upper quadrant, right mid abdomen, mid abdomen and lower abdomen likely secondary to an incarcerated dilated bowel loop in a left lower anterior abdominal hernia. The hernia measures 16.5 x 7.6 x 17.6 cm and contain multiple bowel loops and mesenteric fat. There is a small hiatal hernia. The stomach is otherwise normal. A right lower quadrant ostomy is identified. Vascular/Lymphatic: No significant vascular findings are present. No enlarged abdominal or pelvic lymph nodes. Reproductive: Prostate is unremarkable. Other: None. Musculoskeletal:  Degenerative joint changes of the spine are noted. IMPRESSION: Multiple dilated small bowel loops in the abdomen and pelvis likely secondary to an incarcerated dilated bowel loop in the left lower abdomen hernia. The hernia measures 16.5 x 7.6 x 17.6 cm and contain multiple bowel loops and mesenteric fat. A right lower quadrant ostomy is identified. These results will be called to the ordering clinician or representative by the Radiologist Assistant, and communication documented in the PACS or zVision Dashboard. Electronically Signed   By: WAbelardo DieselM.D.   On: 01/08/2019 16:13   Dg Abd Portable 1 View  Result Date: 01/08/2019 CLINICAL DATA:  Evaluate NG tube placement. EXAM: PORTABLE ABDOMEN - 1 VIEW COMPARISON:  CT scan January 08, 2019 FINDINGS: A right lower quadrant ostomy  is noted. Renal collecting systems are nondilated. The NG tube terminates in the left upper quadrant, within the stomach. IMPRESSION: The NG tube is in good position. Electronically Signed   By: DDorise BullionIII M.D   On: 01/08/2019 18:50     Medical Consultants:    None.  Anti-Infectives:     Subjective:    CHERNAN TURNAGEhis abdominal pain is improved.  Objective:    Vitals:   01/09/19 0513 01/09/19 1416 01/09/19 2046 01/10/19 0540  BP: 117/80 123/75 117/81 110/68  Pulse: 93 91 98 93  Resp: 18 18 18 16   Temp: 98.5 F (36.9 C) 98.8 F (37.1 C) 98.5 F (36.9 C) 99.3 F (37.4 C)  TempSrc: Oral Oral Oral Oral  SpO2: 100% 100% 100% 98%  Weight:    58 kg  Height:        Intake/Output Summary (Last 24 hours) at 01/10/2019 1007 Last data filed at 01/10/2019 0543 Gross per 24 hour  Intake 1239.94 ml  Output 850 ml  Net 389.94 ml   Filed Weights   01/08/19 1845 01/09/19 0500 01/10/19 0540  Weight: 58.9 kg 58.9 kg 58 kg    Exam: General exam: Cachectic appearing disheveled Respiratory system: Good air movement and clear to auscultation. Cardiovascular system: Rate and rhythm with positive S1-S2 no murmurs rubs gallops, no JVD Gastrointestinal system: Diminished distended, with multiple scars,  with diffuse tenderness but no rebound or guarding, ostomy in place. Extremities: No pedal edema. Skin: No rashes, lesions or ulcers Psychiatry: Judgement and insight appear normal. Mood & affect appropriate.    Data Reviewed:    Labs: Basic Metabolic Panel: Recent Labs  Lab 01/08/19 1423 01/09/19 0418 01/10/19 0135  NA 139 140 139  K 3.8 3.5 4.3  CL 101 104 103  CO2 28 25 24   GLUCOSE 101* 153* 119*  BUN 9 9 14   CREATININE 1.01 0.98 0.99  CALCIUM 8.1* 7.8* 7.3*   GFR Estimated Creatinine Clearance: 65.1 mL/min (by C-G formula based on SCr of 0.99 mg/dL). Liver Function Tests: Recent Labs  Lab 01/08/19 1423 01/09/19 0418 01/10/19 0135  AST 23 25 22     ALT 19 20 18   ALKPHOS 50 52 54  BILITOT 0.8 0.7 1.1  PROT 7.1 7.1 7.2  ALBUMIN 3.0* 3.0* 3.0*   Recent Labs  Lab 01/08/19 1423  LIPASE 28   No results for input(s): AMMONIA in the last 168 hours. Coagulation profile No results for input(s): INR, PROTIME in the last 168 hours.  CBC: Recent Labs  Lab 01/08/19 1423 01/09/19 0418  WBC 10.7* 13.1*  NEUTROABS 8.1*  --   HGB 11.5* 12.1*  HCT 35.3* 37.1*  MCV 97.0 93.9  PLT 411* 423*   Cardiac Enzymes: No results for input(s): CKTOTAL, CKMB, CKMBINDEX, TROPONINI in the last 168 hours. BNP (last 3 results) No results for input(s): PROBNP in the last 8760 hours. CBG: No results for input(s): GLUCAP in the last 168 hours. D-Dimer: No results for input(s): DDIMER in the last 72 hours. Hgb A1c: No results for input(s): HGBA1C in the last 72 hours. Lipid Profile: No results for input(s): CHOL, HDL, LDLCALC, TRIG, CHOLHDL, LDLDIRECT in the last 72 hours. Thyroid function studies: No results for input(s): TSH, T4TOTAL, T3FREE, THYROIDAB in the last 72 hours.  Invalid input(s): FREET3 Anemia work up: No results for input(s): VITAMINB12, FOLATE, FERRITIN, TIBC, IRON, RETICCTPCT in the last 72 hours. Sepsis Labs: Recent Labs  Lab 01/08/19 1423 01/09/19 0418  WBC 10.7* 13.1*   Microbiology No results found for this or any previous visit (from the past 240 hour(s)).   Medications:   . [START ON 01/12/2019] Adalimumab  40 mg Subcutaneous Q Wed  . budesonide  6 mg Oral Daily  . dexamethasone  2 mg Intravenous Q24H  . enoxaparin (LOVENOX) injection  40 mg Subcutaneous Q24H  . feeding supplement  1 Container Oral TID BM   Continuous Infusions: . dextrose 5 % and 0.45 % NaCl with KCl 40 mEq/L 75 mL/hr at 01/10/19 0049  . famotidine (PEPCID) IV 20 mg (01/09/19 2330)     LOS: 2 days   Charlynne Cousins  [facility]   *Please refer to [email or web address], password [email or web address] to get updated schedule on who will round on this  patient, as hospitalists switch teams weekly. If 7PM-7AM, please contact night-coverage at [email or web address], password [email or web address] for any overnight needs.  01/10/2019, 10:07 AM

## 2019-01-10 NOTE — Telephone Encounter (Signed)
See telephone noted (12/13/18). I requested ov with RMR only but he is currently scheduled we me. Please change.

## 2019-01-10 NOTE — Progress Notes (Addendum)
Daily Progress Note   Patient Name: Daniel Howard       Date: 01/10/2019 DOB: 01/03/1956  Age: 63 y.o. MRN#: [id number] Attending Physician: Charlynne Cousins, MD Primary Care Physician: Alycia Rossetti, MD Admit Date: 01/08/2019  Reason for Consultation/Follow-up: Establishing goals of care  Subjective: Patient is resting in bed with eyes closed. Wife is at bedside. She states she believes God is sovereign over this. She states they have been married 17 years. He has a total of 7 children.   She states at home, he moves from the couch to sleep to the bed to sleep. If he walks further than around 25 ft. he is tired. We discussed his weight loss, and she states he is skin and bones; but he just tells her he will eat more, but the scale does not improve at weight checks.   She feels he is suffering, has a poor quality of life, and states he is in the hospital frequently.  She states if God is ready to take him, she would have peace with that. She feels he is continuing medical care for her and his children. We discussed the philosophy of hospice. She states his children will likely understand and agree with hospice philosophy except the youngest child.   As patient is drowsy, [place] conversation planned for 10:00 tomorrow morning.   He complains of abdominal pain. No doses of PRN Toradol for moderate pain administered since order. Order changed to 35m q6 hrs for 3 days scheduled. Dilaudid changed to 0.570mfor moderate pain and 1 mg for severe pain q 2 hrs PRN.     Length of Stay: 2  Current Medications: Scheduled Meds:  . [START ON 01/12/2019] Adalimumab  40 mg Subcutaneous Q Wed  . budesonide  6 mg Oral Daily  . dexamethasone  2 mg Intravenous Q24H  . enoxaparin (LOVENOX) injection  40  mg Subcutaneous Q24H  . feeding supplement  1 Container Oral TID BM    Continuous Infusions: . dextrose 5 % and 0.45 % NaCl with KCl 40 mEq/L 75 mL/hr at 01/10/19 0049  . famotidine (PEPCID) IV 20 mg (01/10/19 1119)    PRN Meds: HYDROmorphone (DILAUDID) injection, ketorolac, lidocaine-prilocaine, ondansetron **OR** ondansetron (ZOFRAN) IV  Physical Exam HENT:     Head:     Comments:  NGT in place.  Pulmonary:     Effort: Pulmonary effort is normal.  Skin:    General: Skin is warm and dry.             Vital Signs: BP 110/68 (BP Location: Right Arm)   Pulse 93   Temp 99.3 F (37.4 C) (Oral)   Resp 16   Ht 5' 4.5" (1.638 m)   Wt 58 kg   SpO2 98%   BMI 21.61 kg/m  SpO2: SpO2: 98 % O2 Device: O2 Device: Room Air O2 Flow Rate:    Intake/output summary:   Intake/Output Summary (Last 24 hours) at 01/10/2019 1344 Last data filed at 01/10/2019 0543 Gross per 24 hour  Intake 1239.94 ml  Output 850 ml  Net 389.94 ml   LBM: Last BM Date: 01/09/19 Baseline Weight: Weight: 57.6 kg Most recent weight: Weight: 58 kg       Palliative Assessment/Data: NPO    Flowsheet Rows     Most Recent Value  Intake Tab  Referral Department  Hospitalist  Unit at Time of Referral  Med/Surg Unit  Palliative Care Primary Diagnosis  Cancer  Date Notified  01/09/19  Palliative Care Type  New Palliative care  Reason for referral  End of Life Care Assistance  Date of Admission  01/08/19  Date first seen by Palliative Care  01/09/19  # of days Palliative referral response time  0 Day(s)  # of days IP prior to Palliative referral  1  Clinical Assessment  Psychosocial & Spiritual Assessment  Palliative Care Outcomes      Patient Active Problem List   Diagnosis Date Noted  . Incarcerated hernia 01/09/2019  . Palliative care encounter   . Small bowel obstruction due to adhesions ([place]) 12/29/2018  . Small bowel obstruction ([place]) 12/03/2018  . Partial small bowel obstruction ([place])  11/01/2018  . SBO (small bowel obstruction) ([place]) 09/30/2018  . Iron deficiency anemia 07/03/2018  . Colostomy status ([place]) 07/03/2018  . Nausea without vomiting 11/10/2017  . Incisional hernia 09/22/2017  . Abdominal pain of multiple sites 09/22/2017  . Goals of care, counseling/discussion 04/02/2017  . Prostate cancer metastatic to bone ([place]) 04/02/2017  . Elevated PSA 03/04/2017  . Osteopenia 11/03/2016  . Pelvic mass in male 09/04/2016  . 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 Exacerbation of Crohn's disease ([place]) 09/04/2016  . Protein-calorie malnutrition, severe ([place]) 07/27/2014  . Loss of weight 06/09/2014  . Crohn's disease of both small and large intestine with complication ([place]) 42/68/3419  . Boils 02/11/2013  . Ventral hernia 12/11/2009  . Anemia 12/03/2009  . Regional enteritis/Crohn's 01/25/2007    Palliative Care Assessment & Plan   Recommendations/Plan:  [place] conversation tomorrow at 10:00 .   Code Status:    Code Status Orders  (From admission, onward)         Start     Ordered   01/08/19 1837  Full code  Continuous     01/08/19 1836        Code Status History    Date Active Date Inactive Code Status Order ID Comments User Context   12/29/2018 2137 01/02/2019 1821 Full Code [id number]  Barb Merino, MD ED   12/03/2018 0038 12/03/2018 1648 Partial Code [id number]  Lenore Cordia, MD ED   11/10/2018 1545 11/16/2018 1901 Full Code [id number]  Janora Norlander, MD ED   11/01/2018 1954 11/03/2018 2301 Full Code [id number]  Etta Quill, DO ED   09/30/2018 1443 10/02/2018 1412 Full Code  [id number]  Bonnell Public, MD Inpatient   07/03/2018 0435 07/06/2018 1834 Full Code [id number]  Ivor Costa, MD ED   04/17/2018 0841 04/19/2018 1613 Full Code [id number]  Rondel Jumbo, PA-C ED   03/16/2018 2317 03/19/2018 1657 Full Code [id number]  Vianne Bulls, MD ED   02/17/2018 0103 02/18/2018 1539 Full Code [id number]  Rise Patience, MD ED   11/22/2017 0151 11/26/2017  1718 Full Code [id number]  Roxan Hockey, MD ED   11/14/2017 2348 11/16/2017 1953 Full Code [id number]  Florencia Reasons, MD Inpatient   09/04/2016 1541 09/09/2016 1620 Full Code [id number]  Samella Parr, NP Inpatient   07/26/2014 2147 07/28/2014 2238 Full Code [id number]  Thurnell Lose, MD Inpatient   02/06/2012 0021 02/08/2012 1442 Full Code [id number]  Pasty Arch, RN Inpatient       Prognosis: Poor  Discharge Planning:  To Be Determined   Thank you for allowing the Palliative Medicine Team to assist in the care of this patient.   Time In: 12:20 Time Out: 2:00 Total Time 1 hour 40 min Prolonged Time Billed  yes       Greater than 50%  of this time was spent counseling and coordinating care related to the above assessment and plan.  Asencion Gowda, NP  Please contact Palliative Medicine Team phone at [phone] for questions and concerns.

## 2019-01-10 NOTE — Progress Notes (Signed)
Subjective/Chief Complaint: Pt with no changes   Objective: Vital signs in last 24 hours: Temp:  [98.5 F (36.9 C)-99.3 F (37.4 C)] 99.3 F (37.4 C) (01/20 0540) Pulse Rate:  [91-98] 93 (01/20 0540) Resp:  [16-18] 16 (01/20 0540) BP: (110-123)/(68-81) 110/68 (01/20 0540) SpO2:  [98 %-100 %] 98 % (01/20 0540) Weight:  [58 kg] 58 kg (01/20 0540) Last BM Date: 01/09/19  Intake/Output from previous day: 01/19 0701 - 01/20 0700 In: 1439.9 [I.V.:1339.9; IV Piggyback:100] Out: 1050 [Urine:450; Emesis/NG output:200; Stool:400] Intake/Output this shift: No intake/output data recorded.  Constitutional: No acute distress, conversant, appears states age. Eyes: Anicteric sclerae, moist conjunctiva, no lid lag Lungs: Clear to auscultation bilaterally, normal respiratory effort CV: regular rate and rhythm, no murmurs, no peripheral edema, pedal pulses 2+ GI: Soft, no masses or hepatosplenomegaly, non-tender to palpation, ostomy patent, productive Skin: No rashes, palpation reveals normal turgor Psychiatric: appropriate judgment and insight, oriented to person, place, and time   Lab Results:  Recent Labs    01/08/19 1423 01/09/19 0418  WBC 10.7* 13.1*  HGB 11.5* 12.1*  HCT 35.3* 37.1*  PLT 411* 423*   BMET Recent Labs    01/09/19 0418 01/10/19 0135  NA 140 139  K 3.5 4.3  CL 104 103  CO2 25 24  GLUCOSE 153* 119*  BUN 9 14  CREATININE 0.98 0.99  CALCIUM 7.8* 7.3*   PT/INR No results for input(s): LABPROT, INR in the last 72 hours. ABG No results for input(s): PHART, HCO3 in the last 72 hours.  Invalid input(s): PCO2, PO2  Studies/Results: Ct Abdomen Pelvis W Contrast  Result Date: 01/08/2019 CLINICAL DATA:  Diffuse abdomen pain since last night. EXAM: CT ABDOMEN AND PELVIS WITH CONTRAST TECHNIQUE: Multidetector CT imaging of the abdomen and pelvis was performed using the standard protocol following bolus administration of intravenous contrast. CONTRAST:  148m  OMNIPAQUE IOHEXOL 300 MG/ML  SOLN COMPARISON:  December 29, 2018 FINDINGS: Lower chest: Mild atelectasis of bilateral lung bases are noted. The heart size is normal. Hepatobiliary: No focal liver lesions identified. The gallbladder is not definitely seen. There is probable dilatation of common bowel duct measuring 8.6 mm. Pancreas: There is pancreatic ductal dilatation measuring 6 mm. No focal lesion is identified within the pancreas. Spleen: Normal in size without focal abnormality. Adrenals/Urinary Tract: The bilateral adrenal glands are normal. Bilateral kidney cysts are noted. There is no hydronephrosis bilaterally. Bladder is partially decompressed. The calcifications are identified in the posterior left bladder lumen. Stomach/Bowel: There are multiple dilated small bowel loops in the right upper quadrant, right mid abdomen, mid abdomen and lower abdomen likely secondary to an incarcerated dilated bowel loop in a left lower anterior abdominal hernia. The hernia measures 16.5 x 7.6 x 17.6 cm and contain multiple bowel loops and mesenteric fat. There is a small hiatal hernia. The stomach is otherwise normal. A right lower quadrant ostomy is identified. Vascular/Lymphatic: No significant vascular findings are present. No enlarged abdominal or pelvic lymph nodes. Reproductive: Prostate is unremarkable. Other: None. Musculoskeletal:  Degenerative joint changes of the spine are noted. IMPRESSION: Multiple dilated small bowel loops in the abdomen and pelvis likely secondary to an incarcerated dilated bowel loop in the left lower abdomen hernia. The hernia measures 16.5 x 7.6 x 17.6 cm and contain multiple bowel loops and mesenteric fat. A right lower quadrant ostomy is identified. These results will be called to the ordering clinician or representative by the Radiologist Assistant, and communication documented in the PACS  or zVision Dashboard. Electronically Signed   By: Abelardo Diesel M.D.   On: 01/08/2019 16:13   Dg  Abd Portable 1 View  Result Date: 01/08/2019 CLINICAL DATA:  Evaluate NG tube placement. EXAM: PORTABLE ABDOMEN - 1 VIEW COMPARISON:  CT scan January 08, 2019 FINDINGS: A right lower quadrant ostomy is noted. Renal collecting systems are nondilated. The NG tube terminates in the left upper quadrant, within the stomach. IMPRESSION: The NG tube is in good position. Electronically Signed   By: Dorise Bullion III M.D   On: 01/08/2019 18:50    Anti-infectives: Anti-infectives (From admission, onward)   None      Assessment/Plan: [name] chrohn's dx, recurrent SBO H/o prostate ca   1. Clamp NGT, trial clears as ostomy patent and very productive 2. Mobilize as tol    LOS: 2 days    [name] 01/10/2019

## 2019-01-10 NOTE — Care Management Note (Signed)
Case Management Note  Patient Details  Name: Daniel Howard MRN: [id number] Date of Birth: 12-16-1956  Subjective/Objective:                    Action/Plan: HX  Crohn's disease, HX partial colectomy with colostomy. Will continue to follow for discharge needs   Expected Discharge Date:                  Expected Discharge Plan:  Home/Self Care  In-House Referral:     Discharge planning Services     Post Acute Care Choice:    Choice offered to:     DME Arranged:    DME Agency:     HH Arranged:    HH Agency:     Status of Service:  In process, will continue to follow  If discussed at Long Length of Stay Meetings, dates discussed:    Additional Comments:  Marilu Favre, RN 01/10/2019, 11:35 AM

## 2019-01-10 NOTE — Telephone Encounter (Signed)
Rescheduled to a RMR appointment and mailed letter.

## 2019-01-11 LAB — COMPREHENSIVE METABOLIC PANEL
ALT: 14 U/L (ref 0–44)
AST: 17 U/L (ref 15–41)
Albumin: 2.9 g/dL — ABNORMAL LOW (ref 3.5–5.0)
Alkaline Phosphatase: 49 U/L (ref 38–126)
Anion gap: 6 (ref 5–15)
BUN: 8 mg/dL (ref 6–20)
CALCIUM: 7.3 mg/dL — AB (ref 8.9–10.3)
CO2: 25 mmol/L (ref 22–32)
Chloride: 105 mmol/L (ref 98–111)
Creatinine, Ser: 1.12 mg/dL (ref 0.61–1.24)
GFR calc Af Amer: >60 mL/min (ref >60)
Glucose, Bld: 102 mg/dL — ABNORMAL HIGH (ref 70–99)
Potassium: 4.6 mmol/L (ref 3.5–5.1)
Sodium: 136 mmol/L (ref 135–145)
Total Bilirubin: 1.3 mg/dL — ABNORMAL HIGH (ref 0.3–1.2)
Total Protein: 6.8 g/dL (ref 6.5–8.1)

## 2019-01-11 MED ORDER — MIRTAZAPINE 15 MG PO TABS
15.0000 mg | ORAL_TABLET | Freq: Every day | ORAL | Status: DC
  Administered 2019-01-11 – 2019-01-13 (×3): 15 mg via ORAL
  Filled 2019-01-11 (×3): qty 1

## 2019-01-11 MED ORDER — KETOROLAC TROMETHAMINE 15 MG/ML IJ SOLN
15.0000 mg | Freq: Four times a day (QID) | INTRAMUSCULAR | Status: AC | PRN
  Administered 2019-01-11: 15 mg via INTRAVENOUS
  Filled 2019-01-11: qty 1

## 2019-01-11 MED ORDER — FAMOTIDINE 20 MG PO TABS
20.0000 mg | ORAL_TABLET | Freq: Two times a day (BID) | ORAL | Status: DC
  Administered 2019-01-11 – 2019-01-14 (×6): 20 mg via ORAL
  Filled 2019-01-11 (×6): qty 1

## 2019-01-11 MED ORDER — HYDROMORPHONE HCL 1 MG/ML IJ SOLN
0.5000 mg | INTRAMUSCULAR | Status: DC | PRN
  Administered 2019-01-11 – 2019-01-14 (×9): 0.5 mg via INTRAVENOUS
  Filled 2019-01-11 (×9): qty 1

## 2019-01-11 MED ORDER — OXYCODONE-ACETAMINOPHEN 5-325 MG PO TABS
1.0000 | ORAL_TABLET | ORAL | Status: DC | PRN
  Administered 2019-01-11 – 2019-01-14 (×5): 1 via ORAL
  Filled 2019-01-11 (×6): qty 1

## 2019-01-11 NOTE — Consult Note (Signed)
   [facility] CM Inpatient Consult   01/11/2019  Daniel Howard [birth year] [id number]      Patient screened for potential [facility] Care Management services due to unplanned readmission risk score of 30% (extreme) and multiple hospitalizations.   Chart reviewed and noted palliative care team following for ongoing discussions.  No identifiable [place] Care Management needs at this time.  Discussed with inpatient RNCM.  Marthenia Rolling, MSN-Ed, RN,BSN [facility] Liaison [phone]

## 2019-01-11 NOTE — Progress Notes (Signed)
[facility] Progress Note     Subjective: CC: pain  Patient still having some pain in abdomen but complaining more of back pain this AM. Denies nausea and tolerating CLD. Having output from ileostomy. Discussion planned with palliative medicine for this AM.   Objective: Vital signs in last 24 hours: Temp:  [97.9 F (36.6 C)-98.6 F (37 C)] 98.3 F (36.8 C) (01/21 0442) Pulse Rate:  [84-95] 84 (01/21 0442) Resp:  [14-16] 14 (01/21 0442) BP: (113-124)/(78-79) 115/79 (01/21 0442) SpO2:  [100 %] 100 % (01/21 0442) Last BM Date: 01/11/19  Intake/Output from previous day: 01/20 0701 - 01/21 0700 In: 1183.5 [P.O.:240; I.V.:843.5; IV Piggyback:100] Out: 200 [Urine:50; Emesis/NG output:50; Stool:100] Intake/Output this shift: No intake/output data recorded.  PE: Gen:  Alert, NAD Card:  Regular rate and rhythm Pulm:  Normal effort, clear to auscultation bilaterally Abd: Soft, non-tender, mildly distended, bowel sounds present, stoma is pink and productive, midline scar Skin: warm and dry, no rashes  Psych: A&Ox3   Lab Results:  Recent Labs    01/08/19 1423 01/09/19 0418  WBC 10.7* 13.1*  HGB 11.5* 12.1*  HCT 35.3* 37.1*  PLT 411* 423*   BMET Recent Labs    01/10/19 0135 01/11/19 0633  NA 139 136  K 4.3 4.6  CL 103 105  CO2 24 25  GLUCOSE 119* 102*  BUN 14 8  CREATININE 0.99 1.12  CALCIUM 7.3* 7.3*   PT/INR No results for input(s): LABPROT, INR in the last 72 hours. CMP     Component Value Date/Time   NA 136 01/11/2019 0633   NA 137 11/20/2017 0931   K 4.6 01/11/2019 0633   K 3.3 (L) 11/20/2017 0931   CL 105 01/11/2019 0633   CO2 25 01/11/2019 0633   CO2 27 11/20/2017 0931   GLUCOSE 102 (H) 01/11/2019 0633   GLUCOSE 99 11/20/2017 0931   BUN 8 01/11/2019 0633   BUN 16.6 11/20/2017 0931   CREATININE 1.12 01/11/2019 0633   CREATININE 1.02 12/01/2018 0920   CREATININE 0.99 12/02/2017 1249   CREATININE 1.2 11/20/2017 0931   CALCIUM 7.3 (L)  01/11/2019 0633   CALCIUM 8.9 11/20/2017 0931   PROT 6.8 01/11/2019 0633   PROT 7.5 11/20/2017 0931   ALBUMIN 2.9 (L) 01/11/2019 0633   ALBUMIN 3.1 (L) 11/20/2017 0931   AST 17 01/11/2019 0633   AST 16 12/01/2018 0920   AST 38 (H) 11/20/2017 0931   ALT 14 01/11/2019 0633   ALT 6 12/01/2018 0920   ALT 45 11/20/2017 0931   ALKPHOS 49 01/11/2019 0633   ALKPHOS 62 11/20/2017 0931   BILITOT 1.3 (H) 01/11/2019 0633   BILITOT 0.7 12/01/2018 0920   BILITOT 0.66 11/20/2017 0931   GFRNONAA >60 01/11/2019 0633   GFRNONAA >60 12/01/2018 0920   GFRNONAA 52 (L) 02/11/2017 1640   GFRAA >60 01/11/2019 0633   GFRAA >60 12/01/2018 0920   GFRAA 60 02/11/2017 1640   Lipase     Component Value Date/Time   LIPASE 28 01/08/2019 1423       Studies/Results: No results found.  Anti-infectives: Anti-infectives (From admission, onward)   None       Assessment/Plan Stage IV prostate cancer - per palliative medicine  Hx of Crohn's disease with multiple past abdominal surgeries and ileostomy in place PSBO - NGT removed yesterday and patient tolerating CLD - agree with advance to soft - no indications for acute surgical intervention  FEN: diet soft VTE: SCDs, lovenox ID: no  abx   LOS: 3 days    Brigid Re , [facility] Surgery 01/11/2019, 9:26 AM Pager: [phone] Consults: [phone] Mon-Fri 7:00 am-4:30 pm Sat-Sun 7:00 am-11:30 am

## 2019-01-11 NOTE — Progress Notes (Signed)
[facility] PROGRESS NOTE    Progress Note  Daniel Howard  VHQ:[id number] DOB: 01/19/1956 DOA: 01/08/2019 PCP: Alycia Rossetti, MD     Brief Narrative:   Daniel Howard is an 63 y.o. male past medical history significant for complicated Crohn's disease, status post multiple surgeries including partial colectomy with colostomy in place, recurrent partial small bowel obstruction, with history of metastatic prostate cancer and protein caloric malnutrition comes into the hospital for abdominal crampy abdominal pain that started the night prior to admission CT scan of the abdomen and pelvis was done that showed a small bowel obstruction with secondary incarcerated hernia.  Surgery was consulted and a CT scan were unchanged from previous month they recommended no surgical intervention and conservative management.  Assessment/Plan:   Partial small bowel obstruction (HCC)/incarcerated hernia in the setting of Crohn's disease: NG tube in place,  NG tube has been clamp. Replete electrolytes as needed we will try to keep magnesium above 2 potassium above 4. Appreciate surgery's assistance, further management per surgery. He is putting good amount through his colostomy ostomy. Advance to a soft diet.  Crohn's disease of both small and large intestine with complication ([place]) With a colostomy in place   History of Advance metastatic prostate cancer to the bone: Hold antineoplastic orally. Hospice and palliative care to meet with family today to see how to proceed.  DVT prophylaxis: Lovenox Family Communication:none Disposition Plan/Barrier to D/C: unable to determine Code Status:     Code Status Orders  (From admission, onward)         Start     Ordered   01/08/19 1837  Full code  Continuous     01/08/19 1836        Code Status History    Date Active Date Inactive Code Status Order ID Comments User Context   12/29/2018 2137 01/02/2019 1821 Full Code [id number]  Barb Merino, MD ED   12/03/2018 0038 12/03/2018 1648 Partial Code [id number]  Lenore Cordia, MD ED   11/10/2018 1545 11/16/2018 1901 Full Code [id number]  Janora Norlander, MD ED   11/01/2018 1954 11/03/2018 2301 Full Code [id number]  Etta Quill, DO ED   09/30/2018 1443 10/02/2018 1412 Full Code [id number]  Bonnell Public, MD Inpatient   07/03/2018 0435 07/06/2018 1834 Full Code [id number]  Ivor Costa, MD ED   04/17/2018 0841 04/19/2018 1613 Full Code [id number]  Rondel Jumbo, PA-C ED   03/16/2018 2317 03/19/2018 1657 Full Code [id number]  Vianne Bulls, MD ED   02/17/2018 0103 02/18/2018 1539 Full Code [id number]  Rise Patience, MD ED   11/22/2017 0151 11/26/2017 1718 Full Code [id number]  Roxan Hockey, MD ED   11/14/2017 2348 11/16/2017 1953 Full Code [id number]  Florencia Reasons, MD Inpatient   09/04/2016 1541 09/09/2016 1620 Full Code [id number]  Samella Parr, NP Inpatient   07/26/2014 2147 07/28/2014 2238 Full Code [id number]  Thurnell Lose, MD Inpatient   02/06/2012 0021 02/08/2012 1442 Full Code [id number]  Pasty Arch, RN Inpatient        IV Access:    Peripheral IV   Procedures and diagnostic studies:   No results found.   Medical Consultants:    None.  Anti-Infectives:     Subjective:    Daniel Howard no further abdominal pain, tolerating his diet he is hungry and wants his diet advanced.  Objective:    Vitals:   01/10/19 9381 01/10/19 1529 01/10/19 2138 01/11/19 0175  BP: 110/68 124/78 113/78 115/79  Pulse: 93 95 89 84  Resp: 16 16 14 14   Temp: 99.3 F (37.4 C) 98.6 F (37 C) 97.9 F (36.6 C) 98.3 F (36.8 C)  TempSrc: Oral Oral Oral Oral  SpO2: 98% 100% 100% 100%  Weight: 58 kg     Height:        Intake/Output Summary (Last 24 hours) at 01/11/2019 0852 Last data filed at 01/11/2019 0548 Gross per 24 hour  Intake 1183.51 ml  Output 200 ml  Net 983.51 ml   Filed Weights   01/08/19 1845 01/09/19 0500 01/10/19 0540  Weight: 58.9 kg 58.9  kg 58 kg    Exam: General exam: Cachectic appearing disheveled Respiratory system: Good air movement and clear to auscultation. Cardiovascular system: Rate and rhythm with positive S1-S2 no murmurs rubs gallops, no JVD Gastrointestinal system: Diminished distended, with multiple scars, with diffuse tenderness but no rebound or guarding, ostomy in place. Extremities: No pedal edema. Skin: No rashes, lesions or ulcers Psychiatry: Judgement and insight appear normal. Mood & affect appropriate.    Data Reviewed:    Labs: Basic Metabolic Panel: Recent Labs  Lab 01/08/19 1423 01/09/19 0418 01/10/19 0135 01/11/19 0633  NA 139 140 139 136  K 3.8 3.5 4.3 4.6  CL 101 104 103 105  CO2 28 25 24 25   GLUCOSE 101* 153* 119* 102*  BUN 9 9 14 8   CREATININE 1.01 0.98 0.99 1.12  CALCIUM 8.1* 7.8* 7.3* 7.3*   GFR Estimated Creatinine Clearance: 57.5 mL/min (by C-G formula based on SCr of 1.12 mg/dL). Liver Function Tests: Recent Labs  Lab 01/08/19 1423 01/09/19 0418 01/10/19 0135 01/11/19 0633  AST 23 25 22 17   ALT 19 20 18 14   ALKPHOS 50 52 54 49  BILITOT 0.8 0.7 1.1 1.3*  PROT 7.1 7.1 7.2 6.8  ALBUMIN 3.0* 3.0* 3.0* 2.9*   Recent Labs  Lab 01/08/19 1423  LIPASE 28   No results for input(s): AMMONIA in the last 168 hours. Coagulation profile No results for input(s): INR, PROTIME in the last 168 hours.  CBC: Recent Labs  Lab 01/08/19 1423 01/09/19 0418  WBC 10.7* 13.1*  NEUTROABS 8.1*  --   HGB 11.5* 12.1*  HCT 35.3* 37.1*  MCV 97.0 93.9  PLT 411* 423*   Cardiac Enzymes: No results for input(s): CKTOTAL, CKMB, CKMBINDEX, TROPONINI in the last 168 hours. BNP (last 3 results) No results for input(s): PROBNP in the last 8760 hours. CBG: No results for input(s): GLUCAP in the last 168 hours. D-Dimer: No results for input(s): DDIMER in the last 72 hours. Hgb A1c: No results for input(s): HGBA1C in the last 72 hours. Lipid Profile: No results for input(s): CHOL,  HDL, LDLCALC, TRIG, CHOLHDL, LDLDIRECT in the last 72 hours. Thyroid function studies: No results for input(s): TSH, T4TOTAL, T3FREE, THYROIDAB in the last 72 hours.  Invalid input(s): FREET3 Anemia work up: No results for input(s): VITAMINB12, FOLATE, FERRITIN, TIBC, IRON, RETICCTPCT in the last 72 hours. Sepsis Labs: Recent Labs  Lab 01/08/19 1423 01/09/19 0418  WBC 10.7* 13.1*   Microbiology No results found for this or any previous visit (from the past 240 hour(s)).   Medications:   . [START ON 01/12/2019] Adalimumab  40 mg Subcutaneous Q Wed  . budesonide  6 mg Oral Daily  . dexamethasone  2 mg Intravenous Q24H  . enoxaparin (LOVENOX) injection  40 mg Subcutaneous Q24H  . feeding supplement  1 Container Oral TID BM  .  ketorolac  15 mg Intravenous Q6H   Continuous Infusions: . dextrose 5 % and 0.45 % NaCl with KCl 40 mEq/L 75 mL/hr at 01/11/19 0548  . famotidine (PEPCID) IV 20 mg (01/10/19 2049)     LOS: 3 days   Charlynne Cousins  [facility]   *Please refer to [email or web address], password [email or web address] to get updated schedule on who will round on this patient, as hospitalists switch teams weekly. If 7PM-7AM, please contact night-coverage at [email or web address], password [email or web address] for any overnight needs.  01/11/2019, 8:52 AM

## 2019-01-11 NOTE — Progress Notes (Signed)
Nutrition Brief Note  Chart reviewed. Per palliative care notes, pt likely transitioning to comfort care. Surgery has been re-consulted for possible vent G-tube placement for comfort.  No further nutrition interventions warranted at this time.  Please re-consult as needed.   Megham Dwyer A. Jimmye Norman, RD, LDN, CDE Pager: [phone] After hours Pager: [phone]

## 2019-01-11 NOTE — Progress Notes (Addendum)
Daily Progress Note   Patient Name: Daniel Howard       Date: 01/11/2019 DOB: [birth year]  Age: 63 y.o. MRN#: [id number] Attending Physician: Charlynne Cousins, MD Primary Care Physician: Alycia Rossetti, MD Admit Date: 01/08/2019  Reason for Consultation/Follow-up: Establishing goals of care  Subjective: Patient is resting in bed. Wife and his siblings at bedside.    We discussed his diagnosis, prognosis, GOC, EOL wishes disposition and options.  A detailed discussion was had today regarding advanced directives.  Concepts specific to code status, artifical feeding and hydration, IV antibiotics and rehospitalization were discussed.  The difference between an aggressive medical intervention path and a comfort care path was discussed.  Values and goals of care important to patient and family were attempted to be elicited.  Discussed limitations of medical interventions to prolong quality of life for this patient at this time in this situation and discussed the concept of human mortality.  Natural trajectory and expectations at EOL were discussed.  Questions and concerns addressed.     After much discussion within the family, he states he would love to live longer, but understands it will not improve his quality of life. He is leaning toward focusing on comfort, but is afraid to go home and develop another SBO because of the pressure . At this time, he would like to treat the treatable only. Surgery in to see patient. We discussed his fears. He states he will reach out to see if IR can place a venting PEG for comfort.   Will return tomorrow to discuss options and palliative vs hospice discharge planning.     He states the medication works for a little while and then goes away. He now is no  longer NPO. Percocet started for moderate  Pain.  I completed a MOST form today and the signed original was placed in the chart. A photocopy was placed in the chart to be scanned into EMR. The patient outlined their wishes for the following treatment decisions:  Cardiopulmonary Resuscitation: Do Not Attempt Resuscitation (DNR/No CPR)  Medical Interventions: Limited Additional Interventions: Use medical treatment, IV fluids and cardiac monitoring as indicated, DO NOT USE intubation or mechanical ventilation. May consider use of less invasive airway support such as BiPAP or CPAP. Also provide comfort measures. Transfer to the hospital if  indicated. Avoid intensive care.   Antibiotics: Determine use of limitation of antibiotics when infection occurs  IV Fluids: IV fluids for a defined trial period  Feeding Tube: No feeding tube    Length of Stay: 3  Current Medications: Scheduled Meds:  . [START ON 01/12/2019] Adalimumab  40 mg Subcutaneous Q Wed  . budesonide  6 mg Oral Daily  . dexamethasone  2 mg Intravenous Q24H  . enoxaparin (LOVENOX) injection  40 mg Subcutaneous Q24H  . feeding supplement  1 Container Oral TID BM  . ketorolac  15 mg Intravenous Q6H    Continuous Infusions: . dextrose 5 % and 0.45 % NaCl with KCl 40 mEq/L 75 mL/hr at 01/11/19 1037  . famotidine (PEPCID) IV 20 mg (01/11/19 1042)    PRN Meds: HYDROmorphone (DILAUDID) injection, lidocaine-prilocaine, ondansetron **OR** ondansetron (ZOFRAN) IV, traZODone  Physical Exam Pulmonary:     Effort: Pulmonary effort is normal.  Skin:    General: Skin is warm and dry.             Vital Signs: BP 115/79 (BP Location: Left Arm)   Pulse 84   Temp 98.3 F (36.8 C) (Oral)   Resp 14   Ht 5' 4.5" (1.638 m)   Wt 58 kg   SpO2 100%   BMI 21.61 kg/m  SpO2: SpO2: 100 % O2 Device: O2 Device: Room Air O2 Flow Rate:    Intake/output summary:   Intake/Output Summary (Last 24 hours) at 01/11/2019 1221 Last data filed at  01/11/2019 0900 Gross per 24 hour  Intake 1423.51 ml  Output 100 ml  Net 1323.51 ml   LBM: Last BM Date: 01/11/19 Baseline Weight: Weight: 57.6 kg Most recent weight: Weight: 58 kg       Palliative Assessment/Data: 50%    Flowsheet Rows     Most Recent Value  Intake Tab  Referral Department  Hospitalist  Unit at Time of Referral  Med/Surg Unit  Palliative Care Primary Diagnosis  Cancer  Date Notified  01/09/19  Palliative Care Type  New Palliative care  Reason for referral  End of Life Care Assistance  Date of Admission  01/08/19  Date first seen by Palliative Care  01/09/19  # of days Palliative referral response time  0 Day(s)  # of days IP prior to Palliative referral  1  Clinical Assessment  Psychosocial & Spiritual Assessment  Palliative Care Outcomes      Patient Active Problem List   Diagnosis Date Noted  . Incarcerated hernia 01/09/2019  . Palliative care encounter   . Small bowel obstruction due to adhesions ([place]) 12/29/2018  . Small bowel obstruction ([place]) 12/03/2018  . Partial small bowel obstruction ([place]) 11/01/2018  . SBO (small bowel obstruction) ([place]) 09/30/2018  . Iron deficiency anemia 07/03/2018  . 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 Nausea without vomiting 11/10/2017  . Incisional hernia 09/22/2017  . Abdominal pain of multiple sites 09/22/2017  . Goals of care, counseling/discussion 04/02/2017  . Prostate cancer metastatic to bone ([place]) 04/02/2017  . Elevated PSA 03/04/2017  . Osteopenia 11/03/2016  . Pelvic mass in male 09/04/2016  . 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 Exacerbation of Crohn's disease ([place]) 09/04/2016  . Protein-calorie malnutrition, severe ([place]) 07/27/2014  . Loss of weight 06/09/2014  . Crohn's disease of both small and large intestine with complication ([place]) 26/94/8546  . Boils 02/11/2013  . Ventral hernia 12/11/2009  . Anemia 12/03/2009  . Regional enteritis/Crohn's 01/25/2007    Palliative Care Assessment &  Plan    Recommendations/Plan:  Surgery to find out if IR can place a venting PEG. Palliative vs hospice.   Percocet PRN added for moderate pain as patient is no longer NPO.  Toradol PRN for mild pain.  Dilaudid PRN for severe pain.    Code Status:    Code Status Orders  (From admission, onward)         Start     Ordered   01/08/19 1837  Full code  Continuous     01/08/19 1836        Code Status History    Date Active Date Inactive Code Status Order ID Comments User Context   12/29/2018 2137 01/02/2019 1821 Full Code [id number]  Barb Merino, MD ED   12/03/2018 0038 12/03/2018 1648 Partial Code [id number]  Lenore Cordia, MD ED   11/10/2018 1545 11/16/2018 1901 Full Code [id number]  Janora Norlander, MD ED   11/01/2018 1954 11/03/2018 2301 Full Code [id number]  Etta Quill, DO ED   09/30/2018 1443 10/02/2018 1412 Full Code [id number]  Bonnell Public, MD Inpatient   07/03/2018 0435 07/06/2018 1834 Full Code [id number]  Ivor Costa, MD ED   04/17/2018 0841 04/19/2018 1613 Full Code [id number]  Rondel Jumbo, PA-C ED   03/16/2018 2317 03/19/2018 1657 Full Code [id number]  Vianne Bulls, MD ED   02/17/2018 0103 02/18/2018 1539 Full Code [id number]  Rise Patience, MD ED   11/22/2017 0151 11/26/2017 1718 Full Code [id number]  Roxan Hockey, MD ED   11/14/2017 2348 11/16/2017 1953 Full Code [id number]  Florencia Reasons, MD Inpatient   09/04/2016 1541 09/09/2016 1620 Full Code [id number]  Samella Parr, NP Inpatient   07/26/2014 2147 07/28/2014 2238 Full Code [id number]  Thurnell Lose, MD Inpatient   02/06/2012 0021 02/08/2012 1442 Full Code [id number]  Pasty Arch, RN Inpatient       Prognosis: Poor  Discharge Planning:  To Be Determined   Thank you for allowing the Palliative Medicine Team to assist in the care of this patient.   Time In: 10:15 Time Out: 12:30 Total Time 2 hour 15 min Prolonged Time Billed  yes       Greater than 50%  of this time was spent counseling and  coordinating care related to the above assessment and plan.  Asencion Gowda, NP  Please contact Palliative Medicine Team phone at [phone] for questions and concerns.

## 2019-01-11 NOTE — Care Management Important Message (Signed)
Important Message  Patient Details  Name: Daniel Howard MRN: [id number] Date of Birth: June 03, 1956   Medicare Important Message Given:  Yes    [name] 01/11/2019, 3:37 PM

## 2019-01-12 NOTE — Progress Notes (Signed)
PROGRESS NOTE  Daniel Howard LEX:[id number] DOB: 1956-07-17 DOA: 01/08/2019 PCP: Alycia Rossetti, MD   LOS: 4 days   Brief Narrative / Interim history: Daniel Howard is an 63 y.o. male past medical history significant for complicated Crohn's disease, status post multiple surgeries including partial colectomy with colostomy in place, recurrent partial small bowel obstruction, with history of metastatic prostate cancer and protein caloric malnutrition comes into the hospital for abdominal crampy abdominal pain that started the night prior to admission CT scan of the abdomen and pelvis was done that showed a small bowel obstruction with secondary incarcerated hernia.  Surgery was consulted and a CT scan were unchanged from previous month they recommended no surgical intervention and conservative management.  Subjective: Doing well this morning, no abdominal pain, no nausea or vomiting.  Tolerating diet  Assessment & Plan: Principal Problem:   Partial small bowel obstruction (HCC) Active Problems:   Crohn's disease of both small and large intestine with complication (HCC)   Prostate cancer metastatic to bone (HCC)   Colostomy status (HCC)   SBO (small bowel obstruction) (HCC)   Incarcerated hernia   Palliative care encounter   Principal Problem Partial small bowel obstruction (HCC)/incarcerated hernia in the setting of Crohn's disease -Improving, no longer has NG tube, discussed with general surgery they will sign off. -Electrolytes have improved -Advance diet today, may be able to go home within 24 hours if tolerates diet well  Active Problems Crohn's disease of both small and large intestine with complication ([place]) -With a colostomy in place   History of Advance metastatic prostate cancer to the bone -Hold antineoplastic orally -Palliative care to meet with patient and family today    Scheduled Meds: . Adalimumab  40 mg Subcutaneous Q Wed  . budesonide  6 mg Oral Daily    . enoxaparin (LOVENOX) injection  40 mg Subcutaneous Q24H  . famotidine  20 mg Oral BID  . feeding supplement  1 Container Oral TID BM  . mirtazapine  15 mg Oral QHS   Continuous Infusions: . dextrose 5 % and 0.45 % NaCl with KCl 40 mEq/L 75 mL/hr at 01/12/19 0524   PRN Meds:.HYDROmorphone (DILAUDID) injection, ketorolac, lidocaine-prilocaine, ondansetron **OR** ondansetron (ZOFRAN) IV, oxyCODONE-acetaminophen, traZODone  DVT prophylaxis: Lovenox Code Status: DNR Family Communication: no family present Disposition Plan: TBD, home with hospice 11 day  Consultants:   General surgery   Palliative   Procedures:   None   Antimicrobials:  None    Objective: Vitals:   01/11/19 1350 01/11/19 2141 01/12/19 0503 01/12/19 1030  BP: 91/66 116/78 106/72   Pulse: 90 86 71 80  Resp: 17 14 12    Temp: 99.2 F (37.3 C) 98.9 F (37.2 C) 98.6 F (37 C)   TempSrc: Oral Oral Oral   SpO2: 100% 100% 100%   Weight:      Height:        Intake/Output Summary (Last 24 hours) at 01/12/2019 1209 Last data filed at 01/12/2019 1100 Gross per 24 hour  Intake 2449.16 ml  Output 200 ml  Net 2249.16 ml   Filed Weights   01/08/19 1845 01/09/19 0500 01/10/19 0540  Weight: 58.9 kg 58.9 kg 58 kg    Examination:  Constitutional: NAD Eyes: PERRL, lids and conjunctivae normal ENMT: Mucous membranes are moist.  Respiratory: clear to auscultation bilaterally, no wheezing, no crackles. Cardiovascular: Regular rate and rhythm, no murmurs / rubs / gallops. No LE edema. Abdomen: no tenderness.  Skin: no rashes Neurologic:  CN 2-12 grossly intact. Strength 5/5 in all 4.  Psychiatric: Normal judgment and insight. Alert and oriented x 3. Normal mood.    Data Reviewed: I have independently reviewed following labs and imaging studies   CBC: Recent Labs  Lab 01/08/19 1423 01/09/19 0418  WBC 10.7* 13.1*  NEUTROABS 8.1*  --   HGB 11.5* 12.1*  HCT 35.3* 37.1*  MCV 97.0 93.9  PLT 411* 423*    Basic Metabolic Panel: Recent Labs  Lab 01/08/19 1423 01/09/19 0418 01/10/19 0135 01/11/19 0633  NA 139 140 139 136  K 3.8 3.5 4.3 4.6  CL 101 104 103 105  CO2 28 25 24 25   GLUCOSE 101* 153* 119* 102*  BUN 9 9 14 8   CREATININE 1.01 0.98 0.99 1.12  CALCIUM 8.1* 7.8* 7.3* 7.3*   GFR: Estimated Creatinine Clearance: 57.5 mL/min (by C-G formula based on SCr of 1.12 mg/dL). Liver Function Tests: Recent Labs  Lab 01/08/19 1423 01/09/19 0418 01/10/19 0135 01/11/19 0633  AST 23 25 22 17   ALT 19 20 18 14   ALKPHOS 50 52 54 49  BILITOT 0.8 0.7 1.1 1.3*  PROT 7.1 7.1 7.2 6.8  ALBUMIN 3.0* 3.0* 3.0* 2.9*   Recent Labs  Lab 01/08/19 1423  LIPASE 28   No results for input(s): AMMONIA in the last 168 hours. Coagulation Profile: No results for input(s): INR, PROTIME in the last 168 hours. Cardiac Enzymes: No results for input(s): CKTOTAL, CKMB, CKMBINDEX, TROPONINI in the last 168 hours. BNP (last 3 results) No results for input(s): PROBNP in the last 8760 hours. HbA1C: No results for input(s): HGBA1C in the last 72 hours. CBG: No results for input(s): GLUCAP in the last 168 hours. Lipid Profile: No results for input(s): CHOL, HDL, LDLCALC, TRIG, CHOLHDL, LDLDIRECT in the last 72 hours. Thyroid Function Tests: No results for input(s): TSH, T4TOTAL, FREET4, T3FREE, THYROIDAB in the last 72 hours. Anemia Panel: No results for input(s): VITAMINB12, FOLATE, FERRITIN, TIBC, IRON, RETICCTPCT in the last 72 hours. Urine analysis:    Component Value Date/Time   COLORURINE YELLOW 12/29/2018 1114   APPEARANCEUR CLEAR 12/29/2018 1114   LABSPEC >1.046 (H) 12/29/2018 1114   PHURINE 6.0 12/29/2018 [place] 12/29/2018 1114   [place] 12/29/2018 [place] 12/29/2018 1114   KETONESUR NEGATIVE 12/29/2018 1114   PROTEINUR NEGATIVE 12/29/2018 1114   UROBILINOGEN 0.2 10/10/2014 1037   NITRITE NEGATIVE 12/29/2018 1114   LEUKOCYTESUR NEGATIVE  12/29/2018 1114   Sepsis Labs: Invalid input(s): PROCALCITONIN, LACTICIDVEN  No results found for this or any previous visit (from the past 240 hour(s)).    Radiology Studies: No results found.   Marzetta Board, MD, PhD [facility]  Contact via  [email or web address]  Helper P: [phone]  F: [phone]

## 2019-01-12 NOTE — Progress Notes (Signed)
Patient's family says they will bring his PNKT injections that is due today.

## 2019-01-12 NOTE — Care Management Note (Signed)
Case Management Note  Patient Details  Name: Daniel Howard MRN: [id number] Date of Birth: March 15, 1956  Subjective/Objective:                    Action/Plan:  Continuing to follow, possible  IR can place a venting PEG for comfort. Possible home with Palliative/ Hospice.   Expected Discharge Date:                  Expected Discharge Plan:     In-House Referral:     Discharge planning Services     Post Acute Care Choice:    Choice offered to:     DME Arranged:    DME Agency:     HH Arranged:    HH Agency:     Status of Service:  In process, will continue to follow  If discussed at Long Length of Stay Meetings, dates discussed:    Additional Comments:  Marilu Favre, RN 01/12/2019, 10:43 AM

## 2019-01-12 NOTE — Final Consult Note (Signed)
Consultant Final Sign-Off Note    Assessment/Final recommendations  Daniel Howard is a 63 y.o. male followed by me for PSBO. Patient currently tolerating soft diet and having stool output. Palliative care following for terminal illness. Do not recommend surgical G-tube or PEG given risk of any operation to greatly decrease quality of life. Could consult IR for venting G-tube if desired but given location of stomach on previous CT, not likely a good window for this.    Wound care (if applicable): stoma care per usual   Diet at discharge: FLD to soft diet as tolerated   Activity at discharge: per primary team   Follow-up appointment:  None   Pending results:  Unresulted Labs (From admission, onward)   None       Medication recommendations: None   Other recommendations: None    Thank you for allowing [place] to participate in the care of your patient!  Please consult [place] again if you have further needs for your patient.  [name] 01/12/2019 9:40 AM    Subjective   Patient reports mild gas pains this AM. Tolerating soft diet and having ileostomy output. Wants to focus on comfort and quality of life at this point.   Objective  Vital signs in last 24 hours: Temp:  [98.6 F (37 C)-99.2 F (37.3 C)] 98.6 F (37 C) (01/22 0503) Pulse Rate:  [71-90] 71 (01/22 0503) Resp:  [12-17] 12 (01/22 0503) BP: (91-116)/(66-78) 106/72 (01/22 0503) SpO2:  [100 %] 100 % (01/22 0503)  Gen:  Alert, NAD Card:  Regular rate and rhythm Pulm:  Normal effort, clear to auscultation bilaterally Abd: Soft, non-tender, mildly distended, bowel sounds present, stoma is pink and productive, midline scar Skin: warm and dry, no rashes  Psych: A&Ox3   Pertinent labs and Studies: No results for input(s): WBC, HGB, HCT in the last 72 hours. BMET Recent Labs    01/10/19 0135 01/11/19 0633  NA 139 136  K 4.3 4.6  CL 103 105  CO2 24 25  GLUCOSE 119* 102*  BUN 14 8  CREATININE 0.99 1.12   CALCIUM 7.3* 7.3*   No results for input(s): LABURIN in the last 72 hours. Results for orders placed or performed in visit on 12/02/17  WOUND CULTURE     Status: Abnormal   Collection Time: 12/02/17  4:19 PM  Result Value Ref Range Status   MICRO NUMBER: [id number]  Final   SPECIMEN QUALITY: ADEQUATE  Final   SOURCE: RIGHT CHEEK  Final   STATUS: FINAL  Final   GRAM STAIN:   Final    No white blood cells seen No epithelial cells seen Rare Gram positive cocci in clusters   ISOLATE 1: methicillin resistant Staphylococcus aureus (A)  Final      Susceptibility   Methicillin resistant staphylococcus aureus - AEROBIC CULT, GRAM STAIN POSITIVE 1    VANCOMYCIN <=0.5 Sensitive     CIPROFLOXACIN <=0.5 Sensitive     CLINDAMYCIN <=0.25 Sensitive     LEVOFLOXACIN 0.25 Sensitive     ERYTHROMYCIN >=8 Resistant     GENTAMICIN <=0.5 Sensitive     OXACILLIN* NR Resistant      * Oxacillin-resistant staphylococci are resistant toall currently available beta-lactam antimicrobialagents including penicillins, beta lactam/beta-lactamase inhibitor combinations, and cephems withstaphylococcal indications, including Cefazolin.    TETRACYCLINE <=1 Sensitive     TRIMETH/SULFA* <=10 Sensitive      * Oxacillin-resistant staphylococci are resistant toall currently available beta-lactam antimicrobialagents including penicillins, beta lactam/beta-lactamase inhibitor combinations,  and cephems withstaphylococcal indications, including Cefazolin.Legend:S = Susceptible  I = IntermediateR = Resistant  NS = Not susceptible* = Not tested  NR = Not reported**NN = See antimicrobic comments    Imaging: No results found.

## 2019-01-13 ENCOUNTER — Telehealth: Payer: Self-pay | Admitting: *Deleted

## 2019-01-13 DIAGNOSIS — K56609 Unspecified intestinal obstruction, unspecified as to partial versus complete obstruction: Secondary | ICD-10-CM

## 2019-01-13 MED ORDER — OXYCODONE-ACETAMINOPHEN 5-325 MG PO TABS: 1.0000 | ORAL_TABLET | ORAL | 0 refills | Status: DC | PRN

## 2019-01-13 MED ORDER — ENZALUTAMIDE 40 MG PO CAPS
160.0000 mg | ORAL_CAPSULE | Freq: Every day | ORAL | Status: DC
  Administered 2019-01-13 – 2019-01-14 (×2): 160 mg via ORAL
  Filled 2019-01-13 (×2): qty 4

## 2019-01-13 NOTE — Progress Notes (Addendum)
Daily Progress Note   Patient Name: Daniel Howard       Date: 01/13/2019 DOB: 1956/10/10  Age: 63 y.o. MRN#: [id number] Attending Physician: Caren Griffins, MD Primary Care Physician: Alycia Rossetti, MD Admit Date: 01/08/2019  Reason for Consultation/Follow-up: Establishing goals of care  Subjective: Patient is resting in bed. Wife at bedside.    We discussed his diagnosis, prognosis, GOC, EOL wishes disposition and options.  A detailed discussion was had today regarding advanced directives.  Concepts specific to code status, artifical feeding and hydration, IV antibiotics and rehospitalization were discussed.  The difference between an aggressive medical intervention path and a comfort care path was discussed.  Values and goals of care important to patient and family were attempted to be elicited.  Discussed limitations of medical interventions to prolong quality of life for this patient at this time in this situation and discussed the concept of human mortality.  Natural trajectory and expectations at EOL were discussed.  Questions and concerns addressed.     Unfortunately, Daniel Howard for a venting PEG. After discussion, he states he does not want to continue with suffering, and returning to the hospital. He states he would rather be at home with his family for what time he has left. He states he knows where he is going when he leaves this world, and he is at peace with this. His wife is very supportive of this decision.    New MOST form completed.   I completed a MOST form today and the signed original was placed in the chart. A photocopy was placed in the chart to be scanned into EMR. The patient outlined their wishes with wife present for the following treatment  decisions:  Cardiopulmonary Resuscitation: Do Not Attempt Resuscitation (DNR/No CPR)  Medical Interventions: Comfort Measures: Keep clean, warm, and dry. Use medication by any route, positioning, wound care, and other measures to relieve pain and suffering. Use oxygen, suction and manual treatment of airway obstruction as needed for comfort. Do not transfer to the hospital unless comfort needs cannot be met in current location.  Antibiotics: No antibiotics (use other measures to relieve symptoms)  IV Fluids: No IV fluids (provide other measures to ensure comfort)  Feeding Tube: No feeding tube      Length of Stay:  5  Current Medications: Scheduled Meds:  . Adalimumab  40 mg Subcutaneous Q Wed  . budesonide  6 mg Oral Daily  . enoxaparin (LOVENOX) injection  40 mg Subcutaneous Q24H  . famotidine  20 mg Oral BID  . feeding supplement  1 Container Oral TID BM  . mirtazapine  15 mg Oral QHS    Continuous Infusions: . dextrose 5 % and 0.45 % NaCl with KCl 40 mEq/L 75 mL/hr at 01/13/19 0623    PRN Meds: HYDROmorphone (DILAUDID) injection, ketorolac, lidocaine-prilocaine, ondansetron **OR** ondansetron (ZOFRAN) IV, oxyCODONE-acetaminophen, traZODone  Physical Exam Pulmonary:     Effort: Pulmonary effort is normal.  Skin:    General: Skin is warm and dry.             Vital Signs: BP 110/68 (BP Location: Right Arm)   Pulse 63   Temp 98.5 F (36.9 C) (Oral)   Resp 15   Ht 5' 4.5" (1.638 m)   Wt 58 kg   SpO2 100%   BMI 21.61 kg/m  SpO2: SpO2: 100 % O2 Device: O2 Device: Room Air O2 Flow Rate:    Intake/output summary:   Intake/Output Summary (Last 24 hours) at 01/13/2019 1126 Last data filed at 01/13/2019 5462 Gross per 24 hour  Intake 1986.66 ml  Output 200 ml  Net 1786.66 ml   LBM: Last BM Date: 01/12/19 Baseline Weight: Weight: 57.6 kg Most recent weight: Weight: 58 kg       Palliative Assessment/Data: 40%    Flowsheet Rows     Most Recent Value  Intake  Tab  Referral Department  Hospitalist  Unit at Time of Referral  Med/Surg Unit  Palliative Care Primary Diagnosis  Cancer  Date Notified  01/09/19  Palliative Care Type  New Palliative care  Reason for referral  End of Life Care Assistance  Date of Admission  01/08/19  Date first seen by Palliative Care  01/09/19  # of days Palliative referral response time  0 Day(s)  # of days IP prior to Palliative referral  1  Clinical Assessment  Psychosocial & Spiritual Assessment  Palliative Care Outcomes      Patient Active Problem List   Diagnosis Date Noted  . Incarcerated hernia 01/09/2019  . Palliative care encounter   . Small bowel obstruction due to adhesions ([place]) 12/29/2018  . Small bowel obstruction ([place]) 12/03/2018  . Partial small bowel obstruction ([place]) 11/01/2018  . SBO (small bowel obstruction) ([place]) 09/30/2018  . Iron deficiency anemia 07/03/2018  . Colostomy status ([place]) 07/03/2018  . Nausea without vomiting 11/10/2017  . Incisional hernia 09/22/2017  . Abdominal pain of multiple sites 09/22/2017  . Goals of care, counseling/discussion 04/02/2017  . Prostate cancer metastatic to bone ([place]) 04/02/2017  . Elevated PSA 03/04/2017  . Osteopenia 11/03/2016  . Pelvic mass in male 09/04/2016  . 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 Exacerbation of Crohn's disease ([place]) 09/04/2016  . Protein-calorie malnutrition, severe ([place]) 07/27/2014  . Loss of weight 06/09/2014  . Crohn's disease of both small and large intestine with complication ([place]) 70/35/0093  . Boils 02/11/2013  . Ventral hernia 12/11/2009  . Anemia 12/03/2009  . Regional enteritis/Crohn's 01/25/2007    Palliative Care Assessment & Plan   Recommendations/Plan: Plans for D/C with hospice at home.    Code Status:    Code Status Orders  (From admission, onward)         Start     Ordered   01/08/19 1837  Full code  Continuous     01/08/19 1836        Code Status History    Date Active Date Inactive  Code Status Order ID Comments User Context   12/29/2018 2137 01/02/2019 1821 Full Code [id number]  Barb Merino, MD ED   12/03/2018 0038 12/03/2018 1648 Partial Code [id number]  Lenore Cordia, MD ED   11/10/2018 1545 11/16/2018 1901 Full Code [id number]  Janora Norlander, MD ED   11/01/2018 1954 11/03/2018 2301 Full Code [id number]  Etta Quill, DO ED   09/30/2018 1443 10/02/2018 1412 Full Code [id number]  Bonnell Public, MD Inpatient   07/03/2018 0435 07/06/2018 1834 Full Code [id number]  Ivor Costa, MD ED   04/17/2018 0841 04/19/2018 1613 Full Code [id number]  Rondel Jumbo, PA-C ED   03/16/2018 2317 03/19/2018 1657 Full Code [id number]  Vianne Bulls, MD ED   02/17/2018 0103 02/18/2018 1539 Full Code [id number]  Rise Patience, MD ED   11/22/2017 0151 11/26/2017 1718 Full Code [id number]  Roxan Hockey, MD ED   11/14/2017 2348 11/16/2017 1953 Full Code [id number]  Florencia Reasons, MD Inpatient   09/04/2016 1541 09/09/2016 1620 Full Code [id number]  Samella Parr, NP Inpatient   07/26/2014 2147 07/28/2014 2238 Full Code [id number]  Thurnell Lose, MD Inpatient   02/06/2012 0021 02/08/2012 1442 Full Code [id number]  Pasty Arch, RN Inpatient       Prognosis: Poor. < 6 months. Frequent hospitalizations. Frequent partial bowel obstructions and no desire/not a Howard for operative management. Not a Howard for a venting PEG. Metastatic prostate CA.   Discharge Planning:  To Be Determined   Thank you for allowing the Palliative Medicine Team to assist in the care of this patient.   Time In: 10:00 Time Out: 11:30 Total Time 90 min Prolonged Time Billed  yes       Greater than 50%  of this time was spent counseling and coordinating care related to the above assessment and plan.  Asencion Gowda, NP  Please contact Palliative Medicine Team phone at [phone] for questions and concerns.

## 2019-01-13 NOTE — Care Management Note (Signed)
Case Management Note  Patient Details  Name: Daniel Howard MRN: [id number] Date of Birth: 03-11-1956  Subjective/Objective:                    Action/Plan:  Confirmed face sheet information with patient and wife at bedside.   Discussed home with hospice, patient and wife in agreement. Choice offered. They want [facility]. They are requesting hospice bed and 3 in 1 prior to discharge.   Offered PT[facility] transport home, at present patient wanting to go by car.   Referral given to [facility].  [facility] will order DME, once done will have estimated time of delivery.   Expected Discharge Date:                  Expected Discharge Plan:  Home w Hospice Care  In-House Referral:     Discharge planning Services  CM Consult  Post Acute Care Choice:    Choice offered to:  Patient, Spouse  DME Arranged:  3-N-1, Hospital bed DME Agency:  [place]:  [facility] Agency:  [facility]  Status of Service:  In process, will continue to follow  If discussed at Long Length of Stay Meetings, dates discussed:    Additional Comments:  Marilu Favre, RN 01/13/2019, 11:25 AM

## 2019-01-13 NOTE — Progress Notes (Addendum)
[facility] ([facility])    Notified by Nira Conn, case manager,  of family request for [facility] services at home after discharge.  Chart and patient information under review by [facility] physician at this time and eligibility is pending.    Spoke with patient and wife at the bedside, confirmed interest, answered questions, provided emotional support, and information regarding hospice services and philosophy.      Plan is to discharge once necessary DME has been delivered POV.     Please send signed and completed DNR home with the patient.   Patient will need prescriptions for discharge comfort medications (if necessary).   DME needs discussed, patient currently has the following equipment in the home:   none   DME that will be needed:  Bed, 3 in 1   [facility] Referral Center is aware of the above information.  Completed discharge summary should be faxed to [phone], once eligibility confirmed, and completed.     Please notify [facility] at [phone] (if after 5 call [phone]) when the patient is ready to leave the unit.    [facility] information given to wife and patient   Above information shared with Nira Conn, Case Manager. ? ? Venia Carbon, RN, [facility] Liaison (listed in [place]  **eligibility for hospice services at home has been approved @ 130pm

## 2019-01-13 NOTE — Progress Notes (Signed)
PROGRESS NOTE  Daniel Howard OQH:[id number] DOB: 22-Mar-1956 DOA: 01/08/2019 PCP: Alycia Rossetti, MD   LOS: 5 days   Brief Narrative / Interim history: Daniel Howard is an 63 y.o. male past medical history significant for complicated Crohn's disease, status post multiple surgeries including partial colectomy with colostomy in place, recurrent partial small bowel obstruction, with history of metastatic prostate cancer and protein caloric malnutrition comes into the hospital for abdominal crampy abdominal pain that started the night prior to admission CT scan of the abdomen and pelvis was done that showed a small bowel obstruction with secondary incarcerated hernia.  Surgery was consulted and a CT scan were unchanged from previous month they recommended no surgical intervention and conservative management.  Subjective: No complaints  Assessment & Plan: Principal Problem:   Partial small bowel obstruction (HCC) Active Problems:   Crohn's disease of both small and large intestine with complication (HCC)   Prostate cancer metastatic to bone (HCC)   Colostomy status (HCC)   SBO (small bowel obstruction) ([place])   Incarcerated hernia   Palliative care encounter   Principal Problem Partial small bowel obstruction (HCC)/incarcerated hernia in the setting of Crohn's disease -Improving, no longer has NG tube, discussed with general surgery they will sign off. -Electrolytes have improved -tolerating diet well, home with hospice once equipment set up  Active Problems Crohn's disease of both small and large intestine with complication ([place]) -With a colostomy in place   History of Advance metastatic prostate cancer to the bone -Hold antineoplastic orally -home with hospice in am    Scheduled Meds: . Adalimumab  40 mg Subcutaneous Q Wed  . budesonide  6 mg Oral Daily  . enoxaparin (LOVENOX) injection  40 mg Subcutaneous Q24H  . famotidine  20 mg Oral BID  . feeding supplement  1  Container Oral TID BM  . mirtazapine  15 mg Oral QHS   Continuous Infusions: . dextrose 5 % and 0.45 % NaCl with KCl 40 mEq/L 75 mL/hr at 01/13/19 0623   PRN Meds:.HYDROmorphone (DILAUDID) injection, ketorolac, lidocaine-prilocaine, ondansetron **OR** ondansetron (ZOFRAN) IV, oxyCODONE-acetaminophen, traZODone  DVT prophylaxis: Lovenox Code Status: DNR Family Communication: no family present Disposition Plan: TBD, home with hospice 11 day  Consultants:   General surgery   Palliative   Procedures:   None   Antimicrobials:  None    Objective: Vitals:   01/12/19 1030 01/12/19 1412 01/12/19 2137 01/13/19 0549  BP:  124/78 132/72 110/68  Pulse: 80 81 74 63  Resp:  19 18 15   Temp:  99.3 F (37.4 C) 98.6 F (37 C) 98.5 F (36.9 C)  TempSrc:  Oral Oral Oral  SpO2:  100% 100% 100%  Weight:      Height:        Intake/Output Summary (Last 24 hours) at 01/13/2019 1229 Last data filed at 01/13/2019 5465 Gross per 24 hour  Intake 1986.66 ml  Output 200 ml  Net 1786.66 ml   Filed Weights   01/08/19 1845 01/09/19 0500 01/10/19 0540  Weight: 58.9 kg 58.9 kg 58 kg    Examination:  Constitutional:  NAD Respiratory: CTA Cardiovascular: RRR  Data Reviewed: I have independently reviewed following labs and imaging studies   CBC: Recent Labs  Lab 01/08/19 1423 01/09/19 0418  WBC 10.7* 13.1*  NEUTROABS 8.1*  --   HGB 11.5* 12.1*  HCT 35.3* 37.1*  MCV 97.0 93.9  PLT 411* 681*   Basic Metabolic Panel: Recent Labs  Lab 01/08/19 1423 01/09/19  7225 01/10/19 0135 01/11/19 0633  NA 139 140 139 136  K 3.8 3.5 4.3 4.6  CL 101 104 103 105  CO2 28 25 24 25   GLUCOSE 101* 153* 119* 102*  BUN 9 9 14 8   CREATININE 1.01 0.98 0.99 1.12  CALCIUM 8.1* 7.8* 7.3* 7.3*   GFR: Estimated Creatinine Clearance: 57.5 mL/min (by C-G formula based on SCr of 1.12 mg/dL). Liver Function Tests: Recent Labs  Lab 01/08/19 1423 01/09/19 0418 01/10/19 0135 01/11/19 0633  AST 23  25 22 17   ALT 19 20 18 14   ALKPHOS 50 52 54 49  BILITOT 0.8 0.7 1.1 1.3*  PROT 7.1 7.1 7.2 6.8  ALBUMIN 3.0* 3.0* 3.0* 2.9*   Recent Labs  Lab 01/08/19 1423  LIPASE 28   No results for input(s): AMMONIA in the last 168 hours. Coagulation Profile: No results for input(s): INR, PROTIME in the last 168 hours. Cardiac Enzymes: No results for input(s): CKTOTAL, CKMB, CKMBINDEX, TROPONINI in the last 168 hours. BNP (last 3 results) No results for input(s): PROBNP in the last 8760 hours. HbA1C: No results for input(s): HGBA1C in the last 72 hours. CBG: No results for input(s): GLUCAP in the last 168 hours. Lipid Profile: No results for input(s): CHOL, HDL, LDLCALC, TRIG, CHOLHDL, LDLDIRECT in the last 72 hours. Thyroid Function Tests: No results for input(s): TSH, T4TOTAL, FREET4, T3FREE, THYROIDAB in the last 72 hours. Anemia Panel: No results for input(s): VITAMINB12, FOLATE, FERRITIN, TIBC, IRON, RETICCTPCT in the last 72 hours. Urine analysis:    Component Value Date/Time   COLORURINE YELLOW 12/29/2018 1114   APPEARANCEUR CLEAR 12/29/2018 1114   LABSPEC >1.046 (H) 12/29/2018 1114   PHURINE 6.0 12/29/2018 [place] 12/29/2018 1114   [place] 12/29/2018 [place] 12/29/2018 1114   KETONESUR NEGATIVE 12/29/2018 1114   PROTEINUR NEGATIVE 12/29/2018 1114   UROBILINOGEN 0.2 10/10/2014 1037   NITRITE NEGATIVE 12/29/2018 1114   LEUKOCYTESUR NEGATIVE 12/29/2018 1114   Sepsis Labs: Invalid input(s): PROCALCITONIN, LACTICIDVEN  No results found for this or any previous visit (from the past 240 hour(s)).    Radiology Studies: No results found.   Marzetta Board, MD, PhD [facility]  Contact via  [email or web address]  [place] P: [phone]  F: [phone]

## 2019-01-13 NOTE — Discharge Instructions (Signed)
Follow with Alycia Rossetti, MD as needed in 1-2 weeks Follow up with hospice services  Please get a complete blood count and chemistry panel checked by your Primary MD at your next visit, and again as instructed by your Primary MD. Please get your medications reviewed and adjusted by your Primary MD.  Please request your Primary MD to go over all Hospital Tests and Procedure/Radiological results at the follow up, please get all Hospital records sent to your Prim MD by signing hospital release before you go home.  If you had Pneumonia of Lung problems at the Hospital: Please get a 2 view Chest X ray done in 6-8 weeks after hospital discharge or sooner if instructed by your Primary MD.  If you have Congestive Heart Failure: Please call your Cardiologist or Primary MD anytime you have any of the following symptoms:  1) 3 pound weight gain in 24 hours or 5 pounds in 1 week  2) shortness of breath, with or without a dry hacking cough  3) swelling in the hands, feet or stomach  4) if you have to sleep on extra pillows at night in order to breathe  Follow cardiac low salt diet and 1.5 lit/day fluid restriction.  If you have diabetes Accuchecks 4 times/day, Once in AM empty stomach and then before each meal. Log in all results and show them to your primary doctor at your next visit. If any glucose reading is under 80 or above 300 call your primary MD immediately.  If you have Seizure/Convulsions/Epilepsy: Please do not drive, operate heavy machinery, participate in activities at heights or participate in high speed sports until you have seen by Primary MD or a Neurologist and advised to do so again.  If you had Gastrointestinal Bleeding: Please ask your Primary MD to check a complete blood count within one week of discharge or at your next visit. Your endoscopic/colonoscopic biopsies that are pending at the time of discharge, will also need to followed by your Primary MD.  Get Medicines  reviewed and adjusted. Please take all your medications with you for your next visit with your Primary MD  Please request your Primary MD to go over all hospital tests and procedure/radiological results at the follow up, please ask your Primary MD to get all Hospital records sent to his/her office.  If you experience worsening of your admission symptoms, develop shortness of breath, life threatening emergency, suicidal or homicidal thoughts you must seek medical attention immediately by calling 911 or calling your MD immediately  if symptoms less severe.  You must read complete instructions/literature along with all the possible adverse reactions/side effects for all the Medicines you take and that have been prescribed to you. Take any new Medicines after you have completely understood and accpet all the possible adverse reactions/side effects.   Do not drive or operate heavy machinery when taking Pain medications.   Do not take more than prescribed Pain, Sleep and Anxiety Medications  Special Instructions: If you have smoked or chewed Tobacco  in the last 2 yrs please stop smoking, stop any regular Alcohol  and or any Recreational drug use.  Wear Seat belts while driving.  Please note You were cared for by a hospitalist during your hospital stay. If you have any questions about your discharge medications or the care you received while you were in the hospital after you are discharged, you can call the unit and asked to speak with the hospitalist on call if the hospitalist that  took care of you is not available. Once you are discharged, your primary care physician will handle any further medical issues. Please note that NO REFILLS for any discharge medications will be authorized once you are discharged, as it is imperative that you return to your primary care physician (or establish a relationship with a primary care physician if you do not have one) for your aftercare needs so that they can  reassess your need for medications and monitor your lab values.  You can reach the hospitalist office at phone [phone] or fax [phone]   If you do not have a primary care physician, you can call [phone] for a physician referral.  Activity: As tolerated with Full fall precautions use walker/cane & assistance as needed  Diet: regular  Disposition Home

## 2019-01-13 NOTE — Telephone Encounter (Signed)
Received call from Amy with [facility] [phone]~ telephone.   Reports that patient is to be discharged home from hospital with Hospice and requested MD to remain PCP. VO given.   Admitting Dx: Prostate cancer.   MD to be made aware.

## 2019-01-14 NOTE — Care Management Note (Signed)
Case Management Note  Patient Details  Name: Daniel Howard MRN: [id number] Date of Birth: 30-Jun-1956  Subjective/Objective:                    Action/Plan:  Wife at bedside. All DME was delivered . Patient and wife ready for discharge. Offered ambulance transport home which was declined. Bedside nurse aware.  Expected Discharge Date:  01/14/19               Expected Discharge Plan:  Home w Hospice Care  In-House Referral:     Discharge planning Services  CM Consult  Post Acute Care Choice:    Choice offered to:  Patient, Spouse  DME Arranged:  3-N-1, Hospital bed DME Agency:  [place]:  [facility] Agency:  [facility] [place]  Status of Service:  Completed, signed off  If discussed at [place] of Stay Meetings, dates discussed:    Additional Comments:  Marilu Favre, RN 01/14/2019, 9:50 AM

## 2019-01-14 NOTE — Progress Notes (Signed)
Verbal order from [facility] MD to continue administering antineoplastic medication.

## 2019-01-14 NOTE — Care Management Important Message (Signed)
Important Message  Patient Details  Name: Daniel Howard MRN: [id number] Date of Birth: [birth year]   Medicare Important Message Given:  Yes    [name] 01/14/2019, 11:35 AM

## 2019-01-14 NOTE — Discharge Summary (Signed)
Physician Discharge Summary  Daniel Howard GEX:[id number] DOB: [birth year] DOA: 01/08/2019  PCP: Alycia Rossetti, MD  Admit date: 01/08/2019 Discharge date: 01/14/2019  Admitted From: home Disposition:  Home with hospice   Recommendations for Outpatient Follow-up:  1. Follow up with PCP in 1-2 weeks 2. Follow up with oncology as needed  Home Health: hospice  Equipment/Devices: hospital bed  Discharge Condition: stable CODE STATUS: DNR Diet recommendation: regular  HPI: Per admitting MD, Daniel Howard is a 63 y.o. male with medical history significant for complicated Crohn's disease status post multiple surgeries including partial colectomy with colostomy in place, recurrent partial small bowel obstruction especially in the hernia, chronic abdominal pain as well as history of metastatic prostate cancer, protein calorie malnutrition who comes to the ER for evaluation of abdominal pain described as generalized crampy rib pain that started  Last night w associated nausea. He denies any associated vomiting, fever chills, cough, chest pain.  He has regular ostomy outputs. He describes this episode as similar to prior episodes.  Hospital Course: Principal Problem Partial small bowel obstruction (HCC)/incarcerated hernia in the setting of Crohn's disease -patient was admitted to the hospital with small bowel obstruction.  General surgery was consulted and followed patient while hospitalized.  He was treated conservatively, he improved, able to tolerate a regular diet.  He is having regular bowel movements.  He will be discharged home in stable condition. Active Problems Crohn's disease of both small and large intestine with complication ([place]) -With a colostomy in place.  He is on Humira History ofAdvance metastatic prostate cancer to the bone -patient has advanced metastatic prostate cancer, followed by Dr. Alen Blew as an outpatient.  He is still on therapy with Xtandi.  Palliative care  followed patient while hospitalized.  He is now been set up with home hospice.  Most form was filled.  Discharge Diagnoses:  Principal Problem:   Partial small bowel obstruction (HCC) Active Problems:   Crohn's disease of both small and large intestine with complication (HCC)   Prostate cancer metastatic to bone [facility])   Colostomy status (HCC)   SBO (small bowel obstruction) ([place])   Incarcerated hernia   Palliative care encounter   Discharge Instructions  Allergies as of 01/14/2019      Reactions   Penicillins Hives   Has patient had a PCN reaction causing immediate rash, facial/tongue/throat swelling, SOB or lightheadedness with hypotension: Yes Has patient had a PCN reaction causing severe rash involving mucus membranes or skin necrosis: No Has patient had a PCN reaction that required hospitalization: No Has patient had a PCN reaction occurring within the last 10 years: No If all of the above answers are "NO", then may proceed with Cephalosporin use.      Medication List    STOP taking these medications   HYDROcodone-acetaminophen 10-325 MG tablet Commonly known as:  NORCO     TAKE these medications   Adalimumab 40 MG/0.8ML Pnkt Commonly known as:  HUMIRA PEN Inject 1 Syringe into the skin once a week. What changed:    how much to take  when to take this   budesonide 3 MG 24 hr capsule Commonly known as:  ENTOCORT EC Take 3 capsules (9 mg total) by mouth daily. What changed:  how much to take   CALCIUM 600-D PO Take 2 tablets by mouth daily.   ferrous sulfate 325 (65 FE) MG tablet Take 325 mg by mouth daily with breakfast.   lidocaine-prilocaine cream Commonly known as:  EMLA Apply 1 application topically daily as needed (when accessing port).   mirtazapine 15 MG tablet Commonly known as:  REMERON TAKE 1 TABLET BY MOUTH AT BEDTIME   multivitamin with minerals Tabs tablet Take 1 tablet by mouth daily. Centrum   oxyCODONE-acetaminophen 5-325 MG  tablet Commonly known as:  PERCOCET/ROXICET Take 1 tablet by mouth every 4 (four) hours as needed for moderate pain.   SIMETHICONE PO Take 1-2 tablets by mouth daily as needed (gas/flatulence).   XTANDI 40 MG capsule Generic drug:  enzalutamide TAKE 4 CAPSULES (160 MG TOTAL) BY MOUTH DAILY. What changed:  See the new instructions.      Follow-up Information    [place], [name], MD. Schedule an appointment as soon as possible for a visit in 2 week(s).   Specialty:  Family Medicine Contact information: [address] [place] [address] [phone]        [facility] Follow up.   Contact information: [place] [phone]          Consultations:  Palliative  General surgery   Procedures/Studies:  Dg Abd 1 View  Result Date: 12/30/2018 CLINICAL DATA:  Small bowel obstruction protocol EXAM: ABDOMEN - 1 VIEW COMPARISON:  Is 12/30/2018 FINDINGS: Stable NG tube within the fundus of the stomach. Gas-filled loops of small bowel project over the central abdomen. There is also gas in the colon. There is no disproportionate dilatation of bowel. There is no obvious free intraperitoneal gas. Excreted contrast in the bladder is noted. IMPRESSION: Nonobstructive bowel gas pattern. Electronically Signed   By: Marybelle Killings M.D.   On: 12/30/2018 19:21   Ct Abdomen Pelvis W Contrast  Result Date: 01/08/2019 CLINICAL DATA:  Diffuse abdomen pain since last night. EXAM: CT ABDOMEN AND PELVIS WITH CONTRAST TECHNIQUE: Multidetector CT imaging of the abdomen and pelvis was performed using the standard protocol following bolus administration of intravenous contrast. CONTRAST:  180m OMNIPAQUE IOHEXOL 300 MG/ML  SOLN COMPARISON:  December 29, 2018 FINDINGS: Lower chest: Mild atelectasis of bilateral lung bases are noted. The heart size is normal. Hepatobiliary: No focal liver lesions identified. The gallbladder is not  definitely seen. There is probable dilatation of common bowel duct measuring 8.6 mm. Pancreas: There is pancreatic ductal dilatation measuring 6 mm. No focal lesion is identified within the pancreas. Spleen: Normal in size without focal abnormality. Adrenals/Urinary Tract: The bilateral adrenal glands are normal. Bilateral kidney cysts are noted. There is no hydronephrosis bilaterally. Bladder is partially decompressed. The calcifications are identified in the posterior left bladder lumen. Stomach/Bowel: There are multiple dilated small bowel loops in the right upper quadrant, right mid abdomen, mid abdomen and lower abdomen likely secondary to an incarcerated dilated bowel loop in a left lower anterior abdominal hernia. The hernia measures 16.5 x 7.6 x 17.6 cm and contain multiple bowel loops and mesenteric fat. There is a small hiatal hernia. The stomach is otherwise normal. A right lower quadrant ostomy is identified. Vascular/Lymphatic: No significant vascular findings are present. No enlarged abdominal or pelvic lymph nodes. Reproductive: Prostate is unremarkable. Other: None. Musculoskeletal:  Degenerative joint changes of the spine are noted. IMPRESSION: Multiple dilated small bowel loops in the abdomen and pelvis likely secondary to an incarcerated dilated bowel loop in the left lower abdomen hernia. The hernia measures 16.5 x 7.6 x 17.6 cm and contain multiple bowel loops and mesenteric fat. A right lower quadrant ostomy is identified. These results  will be called to the ordering clinician or representative by the Radiologist Assistant, and communication documented in the PACS or zVision Dashboard. Electronically Signed   By: Abelardo Diesel M.D.   On: 01/08/2019 16:13   Ct Abdomen Pelvis W Contrast  Result Date: 12/29/2018 CLINICAL DATA:  Acute abdominal pain. Crohn's disease. Prostate cancer. EXAM: CT ABDOMEN AND PELVIS WITH CONTRAST TECHNIQUE: Multidetector CT imaging of the abdomen and pelvis was  performed using the standard protocol following bolus administration of intravenous contrast. CONTRAST:  183m OMNIPAQUE IOHEXOL 300 MG/ML  SOLN COMPARISON:  CT scan dated 12/02/2018 FINDINGS: Lower chest: No acute abnormality. Hepatobiliary: Liver parenchyma is normal. Gallbladder is slightly distended. Biliary tree is slightly distended chronically. No discrete stones or gallbladder wall thickening. Pancreas: Chronic dilatation of the pancreatic duct. However, the duct is more prominent than on the prior study of 12/02/2018, now measuring 6 mm in diameter. The duct is dilated to the level of the ampulla. No discrete masses of the pancreas. Spleen: Normal in size. Small enhancing lesions in the spleen consistent with benign hemangiomas as previously described. Adrenals/Urinary Tract: Adrenal glands are normal. Stable tiny cysts in each kidney. No hydronephrosis. Stable stone in the left posterior aspect of the bladder. Bladder is otherwise normal. Stomach/Bowel: There is increased distention of numerous small bowel loops but there are some nondistended bowel loops. The stomach is not distended. There is a large chronic left mid abdominal wall hernia at the site of an ostomy for the descending colon containing nondilated loops of small bowel and a dilated segment of the descending colon. There is less distended small bowel in this hernia than on the prior study. There is a ostomy in the right mid abdomen which appears unchanged. Vascular/Lymphatic: There are several stable left periaortic lymph nodes as well as several small lymph nodes in the mesentery in the right mid abdomen. These are probably reactive in origin. Reproductive: Prostate is unremarkable. Other: No free air or free fluid. Musculoskeletal: No acute bone abnormality. Multiple sclerotic lesions in the bones consistent with metastatic prostate cancer, unchanged. IMPRESSION: 1. Increased distention of numerous small bowel loops with some nondistended  small bowel loops. This could represent a partial or early complete small bowel obstruction. 2. Chronic dilatation of the pancreatic duct. The duct is more prominent than on the prior study of 12/02/2018. Electronically Signed   By: JLorriane ShireM.D.   On: 12/29/2018 14:55   Dg Abd Portable 1 View  Result Date: 01/08/2019 CLINICAL DATA:  Evaluate NG tube placement. EXAM: PORTABLE ABDOMEN - 1 VIEW COMPARISON:  CT scan January 08, 2019 FINDINGS: A right lower quadrant ostomy is noted. Renal collecting systems are nondilated. The NG tube terminates in the left upper quadrant, within the stomach. IMPRESSION: The NG tube is in good position. Electronically Signed   By: DDorise BullionIII M.D   On: 01/08/2019 18:50   Dg Abd Portable 1v-small Bowel Obstruction Protocol-initial, 8 Hr Delay  Result Date: 12/30/2018 CLINICAL DATA:  Small bowel obstruction. EXAM: PORTABLE ABDOMEN - 1 VIEW COMPARISON:  Radiographs of November 14, 2018. CT scan of December 29, 2018. FINDINGS: The bowel gas pattern is normal. Distal tip of nasogastric tube is seen in proximal stomach. Status post prior hernia repair in right lower quadrant. No radio-opaque calculi or other significant radiographic abnormality are seen. IMPRESSION: No evidence of bowel obstruction or ileus. Electronically Signed   By: JMarijo Conception M.D.   On: 12/30/2018 09:34     Subjective: -  no chest pain, shortness of breath, no abdominal pain, nausea or vomiting.   Discharge Exam: Vitals:   01/13/19 2101 01/14/19 0506  BP: 118/76 96/68  Pulse: 80 65  Resp: 16 16  Temp: 98.6 F (37 C) 98.5 F (36.9 C)  SpO2: 100% 100%    General: Pt is alert, awake, not in acute distress Cardiovascular: RRR, S1/S2 +, no rubs, no gallops   The results of significant diagnostics from this hospitalization (including imaging, microbiology, ancillary and laboratory) are listed below for reference.     Microbiology: No results found for this or any previous visit  (from the past 240 hour(s)).   Labs: BNP (last 3 results) Recent Labs    06/27/18 0532  BNP 38.9   Basic Metabolic Panel: Recent Labs  Lab 01/08/19 1423 01/09/19 0418 01/10/19 0135 01/11/19 0633  NA 139 140 139 136  K 3.8 3.5 4.3 4.6  CL 101 104 103 105  CO2 28 25 24 25   GLUCOSE 101* 153* 119* 102*  BUN 9 9 14 8   CREATININE 1.01 0.98 0.99 1.12  CALCIUM 8.1* 7.8* 7.3* 7.3*   Liver Function Tests: Recent Labs  Lab 01/08/19 1423 01/09/19 0418 01/10/19 0135 01/11/19 0633  AST 23 25 22 17   ALT 19 20 18 14   ALKPHOS 50 52 54 49  BILITOT 0.8 0.7 1.1 1.3*  PROT 7.1 7.1 7.2 6.8  ALBUMIN 3.0* 3.0* 3.0* 2.9*   Recent Labs  Lab 01/08/19 1423  LIPASE 28   No results for input(s): AMMONIA in the last 168 hours. CBC: Recent Labs  Lab 01/08/19 1423 01/09/19 0418  WBC 10.7* 13.1*  NEUTROABS 8.1*  --   HGB 11.5* 12.1*  HCT 35.3* 37.1*  MCV 97.0 93.9  PLT 411* 423*   Cardiac Enzymes: No results for input(s): CKTOTAL, CKMB, CKMBINDEX, TROPONINI in the last 168 hours. BNP: Invalid input(s): POCBNP CBG: No results for input(s): GLUCAP in the last 168 hours. D-Dimer No results for input(s): DDIMER in the last 72 hours. Hgb A1c No results for input(s): HGBA1C in the last 72 hours. Lipid Profile No results for input(s): CHOL, HDL, LDLCALC, TRIG, CHOLHDL, LDLDIRECT in the last 72 hours. Thyroid function studies No results for input(s): TSH, T4TOTAL, T3FREE, THYROIDAB in the last 72 hours.  Invalid input(s): FREET3 Anemia work up No results for input(s): VITAMINB12, FOLATE, FERRITIN, TIBC, IRON, RETICCTPCT in the last 72 hours. Urinalysis    Component Value Date/Time   COLORURINE YELLOW 12/29/2018 1114   APPEARANCEUR CLEAR 12/29/2018 1114   LABSPEC >1.046 (H) 12/29/2018 1114   PHURINE 6.0 12/29/2018 1114   GLUCOSEU NEGATIVE 12/29/2018 1114   [place] 12/29/2018 1114   BILIRUBINUR NEGATIVE 12/29/2018 1114   KETONESUR NEGATIVE 12/29/2018 1114   PROTEINUR  NEGATIVE 12/29/2018 1114   UROBILINOGEN 0.2 10/10/2014 1037   NITRITE NEGATIVE 12/29/2018 1114   LEUKOCYTESUR NEGATIVE 12/29/2018 1114   Sepsis Labs Invalid input(s): PROCALCITONIN,  WBC,  LACTICIDVEN   Time coordinating discharge: 25 minutes  SIGNED:  Marzetta Board, MD  [facility] 01/14/2019, 1:08 PM

## 2019-01-14 NOTE — Progress Notes (Signed)
Daniel Howard to be D/C'd  per MD order. Discussed with the patient and all questions fully answered.  VSS, Skin clean, dry and intact without evidence of skin break down, no evidence of skin tears noted.  IV catheter discontinued intact. Site without signs and symptoms of complications. Dressing and pressure applied.  An After Visit Summary was printed and given to the patient. Patient received prescription.  D/c education completed with patient/family including follow up instructions, medication list, d/c activities limitations if indicated, with other d/c instructions as indicated by MD - patient able to verbalize understanding, all questions fully answered.   Patient instructed to return to ED, call 911, or call MD for any changes in condition.   Patient to be escorted via [place], and D/C home via private auto.

## 2019-01-14 NOTE — Progress Notes (Signed)
Text paged Gherghe MD in regards to giving or holding antineoplastic medication. Per note hold medication, order to give medication still active. Waiting for response. Will continue to monitor.

## 2019-01-14 NOTE — Telephone Encounter (Signed)
Noted  

## 2019-01-18 ENCOUNTER — Other Ambulatory Visit: Payer: Medicare Other

## 2019-01-20 ENCOUNTER — Ambulatory Visit: Payer: Medicare Other | Admitting: Oncology

## 2019-01-20 ENCOUNTER — Telehealth: Payer: Self-pay | Admitting: *Deleted

## 2019-01-20 ENCOUNTER — Telehealth: Payer: Self-pay | Admitting: Oncology

## 2019-01-20 ENCOUNTER — Telehealth: Payer: Self-pay | Admitting: Internal Medicine

## 2019-01-20 MED FILL — XTANDI 40 MG CAPSULE: 40 | 30 days supply | Qty: 120 | Fill #0

## 2019-01-20 NOTE — Telephone Encounter (Signed)
Scheduled appt per 1/30 sch message - pt is aware

## 2019-01-20 NOTE — Telephone Encounter (Signed)
Received call from Dr. Antonieta Loveless, Hospice. [phone]~ telephone.   Reports that he has some questions regarding patient care. States that patient is being actively treated for prostate cancer, and oncology was not aware that patient was referred to Hospice.   States that he will be out of office on 01/21/2019, but will call back next wek to discuss with PCP.

## 2019-01-20 NOTE — Telephone Encounter (Signed)
Noted. Sorry to hear this.

## 2019-01-20 NOTE — Telephone Encounter (Signed)
Medication was sent in by [facility].

## 2019-01-20 NOTE — Telephone Encounter (Signed)
Noted. That is sad.

## 2019-01-20 NOTE — Telephone Encounter (Signed)
Pt's wife called to let us know that [facility] was coming to their home to see Yanky.

## 2019-01-21 NOTE — Telephone Encounter (Signed)
noted 

## 2019-01-23 ENCOUNTER — Other Ambulatory Visit: Payer: Self-pay | Admitting: Internal Medicine

## 2019-01-23 NOTE — Progress Notes (Signed)
Home visit:  I went to see Daniel Howard at his home along with my office manager, Sofie Rower on January 31.  We brought him some soup. Major recent setbacks are that of recurrent bowel obstruction in the setting of most complicated Crohn's disease and metastatic prostate cancer.  He is now on hospice.   His Crohn's regimen includes Humira and Entocort.  Recent Prometheus assay demonstrates good Humira blood levels and no antibodies which implies Humira should continue to be efficacious.  He is continuing on Entocort 9 mg daily.  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  He likely has a chronically compromised bowel lumen which ends up transiently obstructed from time to time.  Any contributing factor of metastatic prostate cancer is unknown at this time.  He is not a surgical candidate.  For now, I recommend he continue his Humira regimen along with Entocort 9 mg daily to maximize control of his inflammatory bowel disease.              He should remain on a low residue diet.  Tentatively, plan to see him back in the office in 3 months/PRN.

## 2019-01-24 NOTE — Progress Notes (Signed)
On recall for rmr follow up

## 2019-01-24 NOTE — Progress Notes (Signed)
Daniel Howard, please schedule the patient to come to the office in 3 months.

## 2019-01-28 ENCOUNTER — Other Ambulatory Visit: Payer: Self-pay | Admitting: Family Medicine

## 2019-01-28 ENCOUNTER — Other Ambulatory Visit: Payer: Self-pay | Admitting: *Deleted

## 2019-01-28 DIAGNOSIS — C7951 Secondary malignant neoplasm of bone: Principal | ICD-10-CM

## 2019-01-28 DIAGNOSIS — C61 Malignant neoplasm of prostate: Secondary | ICD-10-CM

## 2019-01-28 MED ORDER — OXYCODONE-ACETAMINOPHEN 5-325 MG PO TABS: 1.0000 | ORAL_TABLET | ORAL | 0 refills | Status: DC | PRN

## 2019-01-28 NOTE — Progress Notes (Signed)
Percocet refilled, pt on hospice

## 2019-01-28 NOTE — Telephone Encounter (Signed)
Received call from patient.   Requested refill on Oxycodone/APAP.   Ok to refill??  Last office visit 12/08/2018.  Last refill 01/13/2019 by Dr. Cruzita Lederer.

## 2019-02-01 ENCOUNTER — Telehealth: Payer: Self-pay | Admitting: Oncology

## 2019-02-01 ENCOUNTER — Inpatient Hospital Stay: Attending: Oncology | Admitting: Oncology

## 2019-02-01 ENCOUNTER — Inpatient Hospital Stay

## 2019-02-01 VITALS — BP 110/80 | HR 83 | Temp 98.4°F | Resp 17 | Ht 64.5 in | Wt 137.3 lb

## 2019-02-01 DIAGNOSIS — K5090 Crohn's disease, unspecified, without complications: Secondary | ICD-10-CM | POA: Insufficient documentation

## 2019-02-01 DIAGNOSIS — Z9221 Personal history of antineoplastic chemotherapy: Secondary | ICD-10-CM | POA: Diagnosis not present

## 2019-02-01 DIAGNOSIS — Z79899 Other long term (current) drug therapy: Secondary | ICD-10-CM | POA: Insufficient documentation

## 2019-02-01 DIAGNOSIS — C61 Malignant neoplasm of prostate: Secondary | ICD-10-CM | POA: Diagnosis not present

## 2019-02-01 DIAGNOSIS — C7951 Secondary malignant neoplasm of bone: Secondary | ICD-10-CM | POA: Insufficient documentation

## 2019-02-01 NOTE — Progress Notes (Signed)
Hematology and Oncology Follow Up Visit  KAHLI FITZGERALD [id number] [birth year] 63 y.o. 02/01/2019 11:35 AM [name], [name], [name], [name], MD   Principle Diagnosis: 23-year man with advanced prostate cancer with lymphadenopathy and bone disease diagnosed in March 2018.  He has documented castration-resistant at this time.  Prior Therapy:   He is status post biopsy on 03/16/2017 of a pelvic mass which confirmed the presence of prostate cancer.  Taxotere chemotherapy at 75 mg/m started on 04/24/2017. He S/P 6 cycles of therapy completed in August 2018.  Current therapy:  Androgen deprivation therapy ongoing at [facility].  Xtandi 160 mg daily started in November 2019.  Interim History: Mr. Evett is here for repeat evaluation.  Since last visit, he was hospitalized in January 2020 for small bowel obstruction and chronic abdominal pain.  He was discharged home with hospice care although he remained on Xtandi.  Since his discharge, he reports of feeling reasonably fair at this time.  His performance status has improved and his abdominal pain has been manageable.  He is ambulating without any major difficulties.  He denies any falls or syncope.  Continues to tolerate Xtandi without any worsening bone pain or back pain.  He denies any lower extremity edema or decline in his performance status.  Patient denied any alteration mental status, neuropathy, confusion or dizziness.  Denies any headaches or lethargy.  Denies any night sweats, weight loss or changes in appetite.  Denied orthopnea, dyspnea on exertion or chest discomfort.  Denies shortness of breath, difficulty breathing hemoptysis or cough.  Denies any abdominal distention, nausea, early satiety or dyspepsia.  Denies any hematuria, frequency, dysuria or nocturia.  Denies any skin irritation, dryness or rash.  Denies any ecchymosis or petechiae.  Denies any lymphadenopathy or clotting.  Denies any heat or cold intolerance.  Denies  any anxiety or depression.  Remaining review of system is negative.     Medications: I have reviewed the patient's current medications.  Current Outpatient Medications  Medication Sig Dispense Refill  . Adalimumab (HUMIRA PEN) 40 MG/0.8ML PNKT Inject 1 Syringe into the skin once a week. (Patient taking differently: Inject 40 mg into the skin every Wednesday. ) 4 each 3  . budesonide (ENTOCORT EC) 3 MG 24 hr capsule Take 3 capsules (9 mg total) by mouth daily. 90 capsule 1  . Calcium Carb-Cholecalciferol (CALCIUM 600-D PO) Take 2 tablets by mouth daily.    . ferrous sulfate 325 (65 FE) MG tablet Take 325 mg by mouth daily with breakfast.    . lidocaine-prilocaine (EMLA) cream Apply 1 application topically daily as needed (when accessing port).     . mirtazapine (REMERON) 15 MG tablet TAKE 1 TABLET BY MOUTH AT BEDTIME (Patient taking differently: Take 15 mg by mouth at bedtime. ) 90 tablet 1  . Multiple Vitamin (MULTIVITAMIN WITH MINERALS) TABS tablet Take 1 tablet by mouth daily. Centrum    . oxyCODONE-acetaminophen (PERCOCET/ROXICET) 5-325 MG tablet Take 1 tablet by mouth every 4 (four) hours as needed for moderate pain. 60 tablet 0  . SIMETHICONE PO Take 1-2 tablets by mouth daily as needed (gas/flatulence).    [name] 40 MG capsule TAKE 4 CAPSULES (160 MG TOTAL) BY MOUTH DAILY. 120 capsule 0   No current facility-administered medications for this visit.    Facility-Administered Medications Ordered in Other Visits  Medication Dose Route Frequency Provider Last Rate Last Dose  . sodium chloride flush (NS) 0.9 % injection 10 mL  10 mL Intravenous  PRN [name], MD   10 mL at 11/20/17 [id number]     Allergies:  Allergies  Allergen Reactions  . Penicillins Hives    Has patient had a PCN reaction causing immediate rash, facial/tongue/throat swelling, SOB or lightheadedness with hypotension: Yes Has patient had a PCN reaction causing severe rash involving mucus membranes or skin necrosis:  No Has patient had a PCN reaction that required hospitalization: No Has patient had a PCN reaction occurring within the last 10 years: No If all of the above answers are "NO", then may proceed with Cephalosporin use.     Past Medical History, Surgical history, Social history, and Family History remained the same on review today.  Physical Exam: Blood pressure 110/80, pulse 83, temperature 98.4 F (36.9 C), temperature source Oral, resp. rate 17, height 5' 4.5" (1.638 m), weight 137 lb 4.8 oz (62.3 kg), SpO2 100 %.    ECOG: 1   General appearance: Comfortable appearing without any discomfort Head: Normocephalic without any trauma Oropharynx: Mucous membranes are moist and pink without any thrush or ulcers. Eyes: Pupils are equal and round reactive to light. Lymph nodes: No cervical, supraclavicular, inguinal or axillary lymphadenopathy.   Heart:regular rate and rhythm.  S1 and S2 without leg edema. Lung: Clear without any rhonchi or wheezes.  No dullness to percussion. Abdomin: Soft, nontender, nondistended with good bowel sounds.  No hepatosplenomegaly. Musculoskeletal: No joint deformity or effusion.  Full range of motion noted. Neurological: No deficits noted on motor, sensory and deep tendon reflex exam. Skin: No petechial rash or dryness.  Appeared moist.  .    Lab Results: Lab Results  Component Value Date   WBC 13.1 (H) 01/09/2019   HGB 12.1 (L) 01/09/2019   HCT 37.1 (L) 01/09/2019   MCV 93.9 01/09/2019   PLT 423 (H) 01/09/2019     Chemistry      Component Value Date/Time   NA 136 01/11/2019 0633   NA 137 11/20/2017 0931   K 4.6 01/11/2019 0633   K 3.3 (L) 11/20/2017 0931   CL 105 01/11/2019 0633   CO2 25 01/11/2019 0633   CO2 27 11/20/2017 0931   BUN 8 01/11/2019 0633   BUN 16.6 11/20/2017 0931   CREATININE 1.12 01/11/2019 0633   CREATININE 1.02 12/01/2018 0920   CREATININE 0.99 12/02/2017 1249   CREATININE 1.2 11/20/2017 0931      Component Value  Date/Time   CALCIUM 7.3 (L) 01/11/2019 0633   CALCIUM 8.9 11/20/2017 0931   ALKPHOS 49 01/11/2019 0633   ALKPHOS 62 11/20/2017 0931   AST 17 01/11/2019 0633   AST 16 12/01/2018 0920   AST 38 (H) 11/20/2017 0931   ALT 14 01/11/2019 0633   ALT 6 12/01/2018 0920   ALT 45 11/20/2017 0931   BILITOT 1.3 (H) 01/11/2019 0633   BILITOT 0.7 12/01/2018 0920   BILITOT 0.66 11/20/2017 0931     Results for LYSLE, YERO (MRN [id number]) as of 02/01/2019 11:38  Ref. Range 07/13/2018 12:16 10/21/2018 09:27 12/01/2018 09:20  Prostate Specific Ag, Serum Latest Ref Range: 0.0 - 4.0 ng/mL 0.5 6.4 (H) 3.7    Impression and Plan:   63 year old man with:  1.  Advanced prostate cancer with disease to the bone and lymphadenopathy that is currently castration-resistant   He continues to be on Xtandi and has tolerated reasonably well with PSA decline in December 2019.  The natural course of this disease was reviewed today and treatment options were discussed.  These  options would include continuing Xtandi, switching to different therapy or discontinuation of therapy and proceeding with hospice.  Risks and benefits of all these approaches were reviewed today and he continues to desire aggressive therapy.  Given the fact that his PSA has declined on Xtandi I recommended continuing this at this time.  We will defer hospice services for the time being.  I recommend proceeding with palliative care services instead.   2. IV access: Port-A-Cath will continue be flushed periodically.  3.  Crohn's disease: Continues to follow with gastroenterology regarding this issue.  4. Bone directed therapy: Continues to receive Xgeva under the care of [facility].  I recommended continuing that.  5. Androgen deprivation therapy: I recommended continuing androgen deprivation therapy indefinitely.  6.  Prognosis and goals of care: His prostate cancer is incurable.  However this disease it can be palliated for a period of  time.  His performance status is adequate and he desires aggressive therapy which we will continue to prescribe at this time.  7. Follow-up: Will be in 3 months to follow his progress.  25 minutes were spent face-to-face with the patient today.  More than 50% of the time was dedicated to reviewing his disease status, laboratory data, treatment options and answering questions regarding future plan of care.   Zola Button, MD 2/11/202011:35 AM

## 2019-02-01 NOTE — Telephone Encounter (Signed)
Scheduled appt per 02/11 los.  Printed calendar and avs.

## 2019-02-02 ENCOUNTER — Telehealth: Payer: Self-pay | Admitting: Family Medicine

## 2019-02-02 ENCOUNTER — Telehealth: Payer: Self-pay | Admitting: Internal Medicine

## 2019-02-02 NOTE — Telephone Encounter (Signed)
Spoke with spouse, she asked if we could call [facility] to get pts medication shipped to them. Called [facility] and they confirmed the dosage that pt is taking. They should be refilling pts medication.

## 2019-02-02 NOTE — Telephone Encounter (Signed)
Noted, I reviewed oncology note  he needs palliative not hospice

## 2019-02-02 NOTE — Telephone Encounter (Signed)
Pt's wife has questions about patient's Humira and [facility]. Please call her back at [phone]

## 2019-02-02 NOTE — Telephone Encounter (Signed)
Hospice calling to let you know that patient "revoked" Hospice care

## 2019-02-03 ENCOUNTER — Ambulatory Visit: Payer: Medicare Other | Admitting: Family Medicine

## 2019-02-03 DIAGNOSIS — C7951 Secondary malignant neoplasm of bone: Secondary | ICD-10-CM | POA: Diagnosis not present

## 2019-02-03 DIAGNOSIS — C61 Malignant neoplasm of prostate: Secondary | ICD-10-CM | POA: Diagnosis not present

## 2019-02-04 ENCOUNTER — Ambulatory Visit (INDEPENDENT_AMBULATORY_CARE_PROVIDER_SITE_OTHER): Payer: Medicare Other | Admitting: Family Medicine

## 2019-02-04 ENCOUNTER — Encounter: Payer: Self-pay | Admitting: Family Medicine

## 2019-02-04 VITALS — BP 132/80 | HR 96 | Temp 98.9°F | Ht 64.5 in | Wt 137.0 lb

## 2019-02-04 DIAGNOSIS — C7951 Secondary malignant neoplasm of bone: Secondary | ICD-10-CM | POA: Diagnosis not present

## 2019-02-04 DIAGNOSIS — L089 Local infection of the skin and subcutaneous tissue, unspecified: Secondary | ICD-10-CM

## 2019-02-04 DIAGNOSIS — E43 Unspecified severe protein-calorie malnutrition: Secondary | ICD-10-CM

## 2019-02-04 DIAGNOSIS — K50819 Crohn's disease of both small and large intestine with unspecified complications: Secondary | ICD-10-CM | POA: Diagnosis not present

## 2019-02-04 DIAGNOSIS — C61 Malignant neoplasm of prostate: Secondary | ICD-10-CM | POA: Diagnosis not present

## 2019-02-04 MED ORDER — SULFAMETHOXAZOLE-TRIMETHOPRIM 800-160 MG PO TABS: 1.0000 | ORAL_TABLET | Freq: Two times a day (BID) | ORAL | 0 refills | Status: DC

## 2019-02-04 NOTE — Patient Instructions (Addendum)
Referral to palliative care We will call [facility] in [place] Take the antibiotics prescribed  Change f/u to end of April

## 2019-02-04 NOTE — Progress Notes (Signed)
Subjective:    Patient ID: Daniel Howard, male    DOB: 19-Apr-1956, 63 y.o.   MRN: [id number]  Patient presents for Drainage from Incision  Pt here due to drainage form his old incisions from abdominal surgery, he keeps getting little scabs in the same area, when he washes it bleeds , this has been going on for over a year, at one point I REMOVED an old stitch. The other day he noticed some pus. No current change in chronic abd pain,l has been hospitalized mutliple times for bowel obstructions, no further surgical intervention can be done, he has metastatic prostate cancer and Crohns. At last admission he was placed on hospice but after further evaluation and weigh in by his physicians- reviewed oncology note feels palliative care more suitable for him and he and his wife is in agreement with palliative care services   Needs notes to keep Hopsital bed, bedside commode and shower chair, helps with his pain, back - [facility] [place]   No fever, good output from colostomy, urinating normally, no vomiting, appetite has improved  Review Of Systems:  GEN- denies fatigue, fever, weight loss,weakness, recent illness HEENT- denies eye drainage, change in vision, nasal discharge, CVS- denies chest pain, palpitations RESP- denies SOB, cough, wheeze ABD- denies N/V, change in stools, chronic abd pain GU- denies dysuria, hematuria, dribbling, incontinence MSK- denies joint pain, muscle aches, injury Neuro- denies headache, dizziness, syncope, seizure activity       Objective:    BP 132/80   Pulse 96   Temp 98.9 F (37.2 C) (Oral)   Ht 5' 4.5" (1.638 m)   Wt 137 lb (62.1 kg)   SpO2 98%   BMI 23.15 kg/m  GEN- NAD, alert and oriented x3 HEENT- PERRL, EOMI, non injected sclera, pink conjunctiva, MMM, oropharynx clear CVS- RRR, no murmur RESP-CTAB ABD-NABS,soft, NT, ostomy in tact- loose stool in bag, stoma pink, hernia to side of midline incision, 2 scabbed areas, scab fell off easily mild  bleeding no odor, no pus,no erthythema surroudning, NT  EXT- No edema Pulses- Radial 2+        Assessment & Plan:      Problem List Items Addressed This Visit      Unprioritized   Crohn's disease of both small and large intestine with complication (HCC) (Chronic)    Reviewed last GI note , continue his current regimen tohelp keep flares at minimal Chronic pain on percocet prescribed by me       Prostate cancer metastatic to bone [facility])    Reviewed oncology note and discussed with family  agree with palliative care services, not hospice candidate at this time Will reach out and switch status He also needs some items at home such as hospital bed, bedside commode, shower chair to help keep him within his home. Hospital bed to help assist with positioning due to chronic abd pain and some back pain with his metastastic prostate cancer Bedside items and shower chair, to help with ADLS in setting of weakness      Relevant Medications   sulfamethoxazole-trimethoprim (BACTRIM DS,SEPTRA DS) 800-160 MG tablet   Protein-calorie malnutrition, severe ([place])    Other Visit Diagnoses    Skin infection    -  Primary   more bleeding from scabs that continue to get irritated, no current pus expressed, given bactrim in case this returns or signs of infection, he has had to be treated before and he and wife are aware of what to look  out for   Relevant Medications   sulfamethoxazole-trimethoprim (BACTRIM DS,SEPTRA DS) 800-160 MG tablet      Note: This dictation was prepared with Dragon dictation along with smaller phrase technology. Any transcriptional errors that result from this process are unintentional.

## 2019-02-05 ENCOUNTER — Other Ambulatory Visit: Payer: Self-pay | Admitting: Internal Medicine

## 2019-02-06 ENCOUNTER — Encounter: Payer: Self-pay | Admitting: Family Medicine

## 2019-02-06 NOTE — Assessment & Plan Note (Signed)
Reviewed last GI note , continue his current regimen tohelp keep flares at minimal Chronic pain on percocet prescribed by me

## 2019-02-06 NOTE — Assessment & Plan Note (Signed)
Reviewed oncology note and discussed with family  agree with palliative care services, not hospice candidate at this time Will reach out and switch status He also needs some items at home such as hospital bed, bedside commode, shower chair to help keep him within his home. Hospital bed to help assist with positioning due to chronic abd pain and some back pain with his metastastic prostate cancer Bedside items and shower chair, to help with ADLS in setting of weakness

## 2019-02-08 ENCOUNTER — Other Ambulatory Visit: Payer: Medicare Other | Admitting: Primary Care

## 2019-02-08 ENCOUNTER — Encounter: Payer: Self-pay | Admitting: Adult Health Nurse Practitioner

## 2019-02-08 ENCOUNTER — Other Ambulatory Visit: Payer: Medicare Other | Admitting: Adult Health Nurse Practitioner

## 2019-02-08 DIAGNOSIS — Z515 Encounter for palliative care: Secondary | ICD-10-CM

## 2019-02-08 NOTE — Progress Notes (Signed)
INITIAL PALLIATIVE CARE CONSULT VISIT   PATIENT NAME: Daniel Howard DOB: 09-12-1956 MRN: [id number]  PRIMARY CARE PROVIDER: Alycia Rossetti, MD  REFERRING PROVIDER: Alycia Rossetti, MD [address] [facility] [place], [place] [address]  RESPONSIBLE PARTY:  Acct ID - Guarantor Home Phone Work Phone Relationship Acct Type  [id number] JAHQUEZ, STEFFLER [phone]  Self P/F     [address], [place], [place] [address]    BILLABLE ICD-10: Z51.1 PRIMARY DECISION MAKER: wife, [name], and self PRIMARY CAREGIVER: Markeese Boyajian   ASSESSMENT and RECOMMENDATIONS:   1. Pain management.  Patient to record how often and how much pain medication he takes daily for a week.  Will titrate or consider basal pain management based on his recordings. Patient states that he does not take medication everyday.  Currently on Percocet 5/325 Q4 hours PRN for pain.  Wife states that he most often will not take the pain medication until it is a 7 or an 8.  Encouraged patient to take pain medication before it got severe and to record how much he takes to get an idea of how much he is actually taking.   2. Weight loss. Better pain management for better appetite. Wife does state that he does not eat when he is in pain. Patient recently lost about 25 pounds with recent hospitalizations.  States that his appetite is good and that he has gained some of his weight back since being back home.  Currently weighs 136 pounds.  3. SBO with frequent hospitalizations. Patient is aware of symptoms when he starts getting constipated.  States that he eats softer foods to help from getting constipated. States that he does not get as much stool in his colostomy bag. Patient has extensive history of Crohn's of small and large intestines and hernias requiring surgical interventions.  4. Advanced care directives.  Has DNR in place.  Left MOST form with patient and wife.  Discussed POA and living will.  Will reassess at next visit on Monday,  02/14/2019, at 10:30  I spent 60 minutes providing this consultation, from 9:30 to 10:30. More than 50% of the time in this consultation was spent coordinating communication.    HISTORY OF PRESENT ILLNESS:   Patient with history of advanced prostate cancer currently on Xtandi.  PSA is lowering with current dose of Xtandi.  No LUTS at this time and denies UTIs.  Has Percocet 5/325 for pain PRN and does not need to take it every day.   States that 2 helps better than one when he is in pain.  States that Hydrocodone 10 mg helps better. When in pain he lays down and does not eat. States his appetite is good with no difficulty swallowing.  Denies any recent falls. Most recent hospitalization was in January 2020 for SBO.     PHYSICAL EXAM:   GEN: thin male LUNGS: lung fields clear throughout, normal respiratory effort CARDIAC: regular rate and rhythm with no gallops, rubs, or murmurs heard; no edema noted ABD: Several surgical scars with recent incision midline with small open area with scant amount of purulent drainage noted.  Colostomy to mid left abdomen with bag and wafer in place with light brown feces noted. NEURO: Patient A&) x3, pleasant and cooperative with exam  LAST PERTINENT LABS 01/11/2019 albumin 2.9 and calcium 7.9    PSA: 7 months ago 0.5, 3 months ago 6.4, 2 months ago 3.5   CODE STATUS:   DNR  ADVANCED DIRECTIVES:  N  PPS: 60% HOSPICE ELIGIBILITY/DIAGNOSIS: Not at this time.  Patient still wants to aggressively pursue treatment for prostate cancer  PAST MEDICAL HISTORY:  Past Medical History:  Diagnosis Date  . Abnormal finding of biliary tract    MRCP shows pancreatic/biliary tract dilation. EUS 2010 confirmed dilation but no chronic pancreaitis or mass. Vascular ectasia crimpoing distal CBD.   [name] Anxiety   . 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 Duodenal ulcer 2010   nsaids  . History of blood transfusion 1992   "related to colon OR"  . Peristomal hernia   . Prostate cancer ([place]) 2018  . SBO (small bowel obstruction) ([place]) 11/10/2018  . Small bowel obstruction ([place]) 10/2017; 11/21/2017; 02/16/2018  . Spigelian hernia    bilateral    SOCIAL HX:  Social History   Tobacco Use  . Smoking status: Former Smoker    Packs/day: 0.75    Years: 32.00    Pack years: 24.00    Types: Cigarettes    Last attempt to quit: 12/21/2009    Years since quitting: 9.1  . Smokeless tobacco: Never Used  Substance Use Topics  . Alcohol use: No    Alcohol/week: 0.0 standard drinks    Comment: Former drinker    ALLERGIES:  Allergies  Allergen Reactions  . Penicillins Hives    Has patient had a PCN reaction causing immediate rash, facial/tongue/throat swelling, SOB or lightheadedness with hypotension: Yes Has patient had a PCN reaction causing severe rash involving mucus membranes or skin necrosis: No Has patient had a PCN reaction that required hospitalization: No Has patient had a PCN reaction occurring within the last 10 years: No If all of the above answers are "NO", then may proceed with Cephalosporin use.         Nishka Heide Jenetta Downer, NP

## 2019-02-08 NOTE — Progress Notes (Signed)
INITIAL PALLIATIVE CARE CONSULT VISIT   PATIENT NAME: Daniel Howard DOB: 02/10/1956 MRN: [id number]  PRIMARY CARE PROVIDER: Alycia Rossetti, MD  REFERRING PROVIDER: Alycia Rossetti, MD [address] [place], [place] [address]  RESPONSIBLE PARTY:  Acct ID - Guarantor Home Phone Work Phone Relationship Acct Type  [id number] KAYN, HAYMORE [phone]  Self P/F     [address], [place], [place] [address]    BILLABLE ICD-10: Z51.1 PRIMARY DECISION MAKER: wife, [name], and self PRIMARY CAREGIVER: Domanic Matusek   ASSESSMENT and RECOMMENDATIONS:   1. Pain management.  Patient to record how often and how much pain medication he takes daily for a week.  Will titrate or consider basal pain management based on his recordings. Patient states that he does not take medication everyday.  Currently on Percocet 5/325 Q4 hours PRN for pain.  Wife states that he most often will not take the pain medication until it is a 7 or an 8.  Encouraged patient to take pain medication before it got severe and to record how much he takes to get an idea of how much he is actually taking.   2. Weight loss. Better pain management for better appetite. Wife does state that he does not eat when he is in pain. Patient recently lost about 25 pounds with recent hospitalizations.  States that his appetite is good and that he has gained some of his weight back since being back home.  Currently weighs 136 pounds.  3. SBO with frequent hospitalizations. Patient is aware of symptoms when he starts getting constipated.  States that he eats softer foods to help from getting constipated. States that he does not get as much stool in his colostomy bag. Patient has extensive history of Crohn's of small and large intestines and hernias requiring surgical interventions.  4. Advanced care directives.  Has DNR in place.  Left MOST form with patient and wife.  Discussed POA and living will.  Will reassess at next visit on Monday,  02/14/2019, at 10:30  I spent 60 minutes providing this consultation, from 9:30 to 10:30. More than 50% of the time in this consultation was spent coordinating communication.    HISTORY OF PRESENT ILLNESS:   Patient with history of advanced prostate cancer currently on Xtandi.  PSA is lowering with current dose of Xtandi.  No LUTS at this time and denies UTIs.  Has Percocet 5/325 for pain PRN and does not need to take it every day.   States that 2 helps better than one when he is in pain.  States that Hydrocodone 10 mg helps better. When in pain he lays down and does not eat. States his appetite is good with no difficulty swallowing.  Denies any recent falls. Most recent hospitalization was in January 2020 for SBO.     PHYSICAL EXAM:   GEN: thin male LUNGS: lung fields clear throughout, normal respiratory effort CARDIAC: regular rate and rhythm with no gallops, rubs, or murmurs heard; no edema noted ABD: Several surgical scars with recent incision midline with small open area with scant amount of purulent drainage noted.  Colostomy to mid left abdomen with bag and wafer in place with light brown feces noted. NEURO: Patient A&) x3, pleasant and cooperative with exam  LAST PERTINENT LABS 01/11/2019 albumin 2.9 and calcium 7.9    PSA: 7 months ago 0.5, 3 months ago 6.4, 2 months ago 3.5   CODE STATUS:   DNR  ADVANCED DIRECTIVES:  N  PPS: 60% HOSPICE ELIGIBILITY/DIAGNOSIS: Not at this time.  Patient still wants to aggressively pursue treatment for prostate cancer  PAST MEDICAL HISTORY:  Past Medical History:  Diagnosis Date  . Abnormal finding of biliary tract    MRCP shows pancreatic/biliary tract dilation. EUS 2010 confirmed dilation but no chronic pancreaitis or mass. Vascular ectasia crimpoing distal CBD.   [name] Anxiety   . 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 Duodenal ulcer 2010   nsaids  . History of blood transfusion 1992   "related to colon OR"  . Peristomal hernia   . Prostate cancer ([facility]) 2018  . SBO (small bowel obstruction) ([facility]) 11/10/2018  . Small bowel obstruction ([facility]) 10/2017; 11/21/2017; 02/16/2018  . Spigelian hernia    bilateral    SOCIAL HX:  Social History   Tobacco Use  . Smoking status: Former Smoker    Packs/day: 0.75    Years: 32.00    Pack years: 24.00    Types: Cigarettes    Last attempt to quit: 12/21/2009    Years since quitting: 9.1  . Smokeless tobacco: Never Used  Substance Use Topics  . Alcohol use: No    Alcohol/week: 0.0 standard drinks    Comment: Former drinker    ALLERGIES:  Allergies  Allergen Reactions  . Penicillins Hives    Has patient had a PCN reaction causing immediate rash, facial/tongue/throat swelling, SOB or lightheadedness with hypotension: Yes Has patient had a PCN reaction causing severe rash involving mucus membranes or skin necrosis: No Has patient had a PCN reaction that required hospitalization: No Has patient had a PCN reaction occurring within the last 10 years: No If all of the above answers are "NO", then may proceed with Cephalosporin use.         Marizol Borror Jenetta Downer, NP

## 2019-02-10 ENCOUNTER — Other Ambulatory Visit: Payer: Self-pay | Admitting: Oncology

## 2019-02-10 DIAGNOSIS — C61 Malignant neoplasm of prostate: Secondary | ICD-10-CM

## 2019-02-14 ENCOUNTER — Other Ambulatory Visit: Payer: Medicare Other | Admitting: Adult Health Nurse Practitioner

## 2019-02-14 DIAGNOSIS — Z515 Encounter for palliative care: Secondary | ICD-10-CM

## 2019-02-14 NOTE — Progress Notes (Signed)
[facility] Consult Note Telephone: [phone]  Fax: [phone] 02/14/2019  PATIENT NAME: LASHON HILLIER DOB: 06-Aug-1956 MRN: [id number]  PRIMARY CARE PROVIDER:   Alycia Rossetti, MD  REFERRING PROVIDER:  Alycia Rossetti, MD [address] [facility] [address] [place], [place] [address]  RESPONSIBLE PARTY:    Acct ID - Guarantor Home Phone Work Phone Relationship Acct Type  [id number] MARICUS, TANZI [phone]  Self P/F     [place], [place], [place] [address]         RECOMMENDATIONS and PLAN:  1. Pain management. Increase Percocet to 10/325 Q 4 HRS PRN or change Percocet to Morphine Sulfate ER 10 MG Q12 hours PRN.  Patient states that he still does not need to take pain medication everyday but when he does need to take pain medication the Percocet 5/325 does not help and he ends up taking another one about an hour later with better pain relief.  Does state that when he was in the hospital he was given oral morphine which gave him the best pain relief. 2. Weight loss. Patient states that his appetite is really good which is confirmed by his wife.  Patient starting to regain weight  3. SBO with frequent hospitalizations. Patient denies any issues with constipation        4. Advanced care directives.  Has DNR in place. Patient has MOST form           in place dated 01/13/2019 with wishes of no antibiotics, IV fluids, or feeding           tube.  I spent 20 minutes providing this consultation,  From 10:45 to 11:05. More than 50% of the time in this consultation was spent coordinating communication.   HISTORY OF PRESENT ILLNESS:  HUBER MATHERS is a 63 y.o. year old male with multiple medical problems including prostate cancer, Crohn's . Palliative Care was asked to help address goals of care.   CODE STATUS: DNR  PPS: 60% HOSPICE ELIGIBILITY/DIAGNOSIS: TBD  PHYSICAL EXAM:   GEN: thin male LUNGS: lung fields clear throughout, normal  respiratory effort CARDIAC: regular rate and rhythm with no gallops, rubs, or murmurs heard; no edema noted ABD: Several surgical scars with recent incision midline with small open area with scant amount of purulent drainage noted.  Colostomy to mid left abdomen with bag and wafer in place with light brown feces noted. NEURO: Patient A&O x3, pleasant and cooperative with exam  PAST MEDICAL HISTORY:  Past Medical History:  Diagnosis Date  . Abnormal finding of biliary tract    MRCP shows pancreatic/biliary tract dilation. EUS 2010 confirmed dilation but no chronic pancreaitis or mass. Vascular ectasia crimpoing distal CBD.   [name] Anxiety   . 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 Duodenal ulcer 2010   nsaids  . History of blood transfusion 1992   "related to colon OR"  . Peristomal hernia   . Prostate cancer ([facility]) 2018  . SBO (small bowel obstruction) ([facility]) 11/10/2018  . Small bowel obstruction ([facility]) 10/2017; 11/21/2017; 02/16/2018  . Spigelian hernia    bilateral    SOCIAL HX:  Social History  Tobacco Use  . Smoking status: Former Smoker    Packs/day: 0.75    Years: 32.00    Pack years: 24.00    Types: Cigarettes    Last attempt to quit: 12/21/2009    Years since quitting: 9.1  . Smokeless tobacco: Never Used  Substance Use Topics  . Alcohol use: No    Alcohol/week: 0.0 standard drinks    Comment: Former drinker    ALLERGIES:  Allergies  Allergen Reactions  . Penicillins Hives    Has patient had a PCN reaction causing immediate rash, facial/tongue/throat swelling, SOB or lightheadedness with hypotension: Yes Has patient had a PCN  reaction causing severe rash involving mucus membranes or skin necrosis: No Has patient had a PCN reaction that required hospitalization: No Has patient had a PCN reaction occurring within the last 10 years: No If all of the above answers are "NO", then may proceed with Cephalosporin use.      PERTINENT MEDICATIONS:  Outpatient Encounter Medications as of 02/14/2019  Medication Sig  . Adalimumab (HUMIRA PEN) 40 MG/0.8ML PNKT Inject 1 Syringe into the skin once a week. (Patient taking differently: Inject 40 mg into the skin every Wednesday. )  . budesonide (ENTOCORT EC) 3 MG 24 hr capsule TAKE 3 CAPSULES BY MOUTH DAILY.  . Calcium Carb-Cholecalciferol (CALCIUM 600-D PO) Take 2 tablets by mouth daily.  . ferrous sulfate 325 (65 FE) MG tablet Take 325 mg by mouth daily with breakfast.  . lidocaine-prilocaine (EMLA) cream Apply 1 application topically daily as needed (when accessing port).   . mirtazapine (REMERON) 15 MG tablet TAKE 1 TABLET BY MOUTH AT BEDTIME (Patient taking differently: Take 15 mg by mouth at bedtime. )  . Multiple Vitamin (MULTIVITAMIN WITH MINERALS) TABS tablet Take 1 tablet by mouth daily. Centrum  . oxyCODONE-acetaminophen (PERCOCET/ROXICET) 5-325 MG tablet Take 1 tablet by mouth every 4 (four) hours as needed for moderate pain.  [name] SIMETHICONE PO Take 1-2 tablets by mouth daily as needed (gas/flatulence).  [name] sulfamethoxazole-trimethoprim (BACTRIM DS,SEPTRA DS) 800-160 MG tablet Take 1 tablet by mouth 2 (two) times daily.  [name] 40 MG capsule TAKE 4 CAPSULES (160 MG TOTAL) BY MOUTH DAILY.   Facility-Administered Encounter Medications as of 02/14/2019  Medication  . sodium chloride flush (NS) 0.9 % injection 10 mL      Fatima Fedie Jenetta Downer, NP

## 2019-02-16 ENCOUNTER — Ambulatory Visit (HOSPITAL_COMMUNITY)
Admission: EM | Admit: 2019-02-16 | Discharge: 2019-02-16 | Disposition: A | Discharge status: Home / Self Care | Payer: Medicare Other | Attending: Family Medicine | Admitting: Family Medicine

## 2019-02-16 ENCOUNTER — Encounter (HOSPITAL_COMMUNITY): Payer: Self-pay | Admitting: Emergency Medicine

## 2019-02-16 DIAGNOSIS — J111 Influenza due to unidentified influenza virus with other respiratory manifestations: Secondary | ICD-10-CM

## 2019-02-16 DIAGNOSIS — R69 Illness, unspecified: Secondary | ICD-10-CM | POA: Diagnosis not present

## 2019-02-16 MED ORDER — ACETAMINOPHEN 325 MG PO TABS
ORAL_TABLET | ORAL | Status: AC
  Filled 2019-02-16: qty 2

## 2019-02-16 MED ORDER — ACETAMINOPHEN 325 MG PO TABS
650.0000 mg | ORAL_TABLET | Freq: Once | ORAL | Status: AC
  Administered 2019-02-16: 650 mg via ORAL

## 2019-02-16 MED ORDER — OSELTAMIVIR PHOSPHATE 75 MG PO CAPS: 75.0000 mg | ORAL_CAPSULE | Freq: Two times a day (BID) | ORAL | 0 refills | Status: AC

## 2019-02-16 MED ORDER — ACETAMINOPHEN 325 MG RE SUPP
RECTAL | Status: AC
  Filled 2019-02-16: qty 2

## 2019-02-16 MED ORDER — BENZONATATE 200 MG PO CAPS: 200.0000 mg | ORAL_CAPSULE | Freq: Three times a day (TID) | ORAL | 0 refills | Status: AC | PRN

## 2019-02-16 MED ORDER — FLUTICASONE PROPIONATE 50 MCG/ACT NA SUSP: 1.0000 | Freq: Every day | NASAL | 0 refills | Status: DC

## 2019-02-16 NOTE — ED Triage Notes (Signed)
Pt sts fever and URI sx

## 2019-02-16 NOTE — Discharge Instructions (Signed)
Symptoms most likely flu, begin tamiflu twice daily. Follow up if symptoms not resolving or wrosening  1. Take a daily allergy pill/anti-histamine like Zyrtec, Claritin, or Store brand consistently for 2 weeks  2. For congestion you may try an oral decongestant like Mucinex or sudafed. You may also try intranasal flonase nasal spray or saline irrigations (neti pot, sinus cleanse)  3. For your sore throat you may try cepacol lozenges, salt water gargles, throat spray. Treatment of congestion may also help your sore throat.  4. For cough you may try Tessalon provided, or Delsym, Robitussen, Mucinex DM  5. Take Tylenol or Ibuprofen to help with pain/inflammation  6. Stay hydrated, drink plenty of fluids to keep throat coated and less irritated  Honey Tea For cough/sore throat try using a honey-based tea. Use 3 teaspoons of honey with juice squeezed from half lemon. Place shaved pieces of ginger into 1/2-1 cup of water and warm over stove top. Then mix the ingredients and repeat every 4 hours as needed.

## 2019-02-16 NOTE — ED Provider Notes (Signed)
[place]    CSN: [id number] Arrival date & time: 02/16/19  1017     History   Chief Complaint Chief Complaint  Patient presents with  . URI  . Fever    HPI BRITTON BERA is a 62 y.o. male history of Crohn's, duodenal ulcer, presenting today for evaluation of URI symptoms and fever.  Patient states that yesterday he developed fever, congestion, cough and sore throat.  Sore throat is been mild.  He is not taking medicines for symptoms.  He has had some mild body aches.  Denies any nausea vomiting or diarrhea.  Denies any exposure to flu.  Denies any recent travel or exposure to anyone with recent international travel.  HPI  Past Medical History:  Diagnosis Date  . Abnormal finding of biliary tract    MRCP shows pancreatic/biliary tract dilation. EUS 2010 confirmed dilation but no chronic pancreaitis or mass. Vascular ectasia crimpoing distal CBD.   [name] Anxiety   . 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 Duodenal ulcer 2010   nsaids  . History of blood transfusion 1992   "related to colon OR"  . Peristomal hernia   . Prostate cancer ([place]) 2018  . SBO (small bowel obstruction) ([place]) 11/10/2018  . Small bowel obstruction ([place]) 10/2017; 11/21/2017; 02/16/2018  . Spigelian hernia    bilateral    Patient Active Problem List   Diagnosis Date Noted  . Incarcerated hernia 01/09/2019  . Palliative care encounter   . Small bowel obstruction due to adhesions ([place]) 12/29/2018  . Small bowel obstruction ([place]) 12/03/2018  . Partial small bowel obstruction ([place]) 11/01/2018  . SBO (small bowel obstruction) ([place]) 09/30/2018  . Iron  deficiency anemia 07/03/2018  . 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 Nausea without vomiting 11/10/2017  . Incisional hernia 09/22/2017  . Abdominal pain of multiple sites 09/22/2017  . Goals of care, counseling/discussion 04/02/2017  . 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 Elevated PSA 03/04/2017  . Osteopenia 11/03/2016  . Pelvic mass in male 09/04/2016  . 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 Exacerbation of Crohn's disease ([place]) 09/04/2016  . Family history of colon cancer 10/12/2014  . Protein-calorie malnutrition, severe ([place]) 07/27/2014  . Loss of weight 06/09/2014  . Crohn's disease of both small and large intestine with complication ([place]) 03/00/9233  . Boils 02/11/2013  . Ventral hernia 12/11/2009  . Anemia 12/03/2009  . Regional enteritis/Crohn's 01/25/2007    Past Surgical History:  Procedure Laterality Date  . APPENDECTOMY  1992   at time of exp laparotomy at which time he was noted to have fistulizing Crohn's rather than UC  . COLON SURGERY    . COLONOSCOPY [name] 08/23/2014   AQT:MAUQJFH proctoscopy with possible fistulous opening in thebase of rectal/anal stump.    . COLOSTOMY  1992   transverse loop colostomy secondary to a stricture  . ESOPHAGOGASTRODUODENOSCOPY  05/2009   SLF: multiple antral erosions, large ulcer at ansatomosis (postsurgical changes at duodenal bulb and second portion of duodenum) BX c/x NSAIDS.  . EUS  10/04/2009   Dr. Estill Bakes dilated CBD and main pancreatic duct.  No pancreatic  . EXPLORATORY LAPAROTOMY  1992  . FLEXIBLE SIGMOIDOSCOPY  1988   Dr. Laural Golden- suggested rohn's disease but the biopsies were not collaborative.  [name] FLEXIBLE SIGMOIDOSCOPY [name] 09/08/2016   Procedure: FLEXIBLE SIGMOIDOSCOPY;  Surgeon: Wonda Horner, MD;  Location: [facility] ENDOSCOPY;  Service: Gastroenterology;  Laterality: [name];  . HEMICOLOECTOMY W/ ANASTOMOSIS  1993   R- Dr.DeMason   . HERNIA REPAIR  1996   incarcerated periostial hernia with additional  surgery in 1999  . IR FLUORO GUIDE PORT INSERTION RIGHT  04/03/2017  . IR US GUIDE VASC ACCESS RIGHT  04/03/2017       Home Medications    Prior to Admission medications   Medication Sig Start Date End Date Taking? Authorizing Provider  Adalimumab (HUMIRA PEN) 40 MG/0.8ML PNKT Inject 1 Syringe into the skin once a week. Patient taking differently: Inject 40 mg into the skin every Wednesday.  09/01/18   Annitta Needs, NP  benzonatate (TESSALON) 200 MG capsule Take 1 capsule (200 mg total) by mouth 3 (three) times daily as needed for up to 7 days for cough. 02/16/19 02/23/19  Allyanna Appleman C, PA-C  budesonide (ENTOCORT EC) 3 MG 24 hr capsule TAKE 3 CAPSULES BY MOUTH DAILY. 02/07/19   Carlis Stable, NP  Calcium Carb-Cholecalciferol (CALCIUM 600-D PO) Take 2 tablets by mouth daily.    [provider]  ferrous sulfate 325 (65 FE) MG tablet Take 325 mg by mouth daily with breakfast.    [provider]  fluticasone (FLONASE) 50 MCG/ACT nasal spray Place 1-2 sprays into both nostrils daily for 7 days. 02/16/19 02/23/19  Conner Neiss C, PA-C  lidocaine-prilocaine (EMLA) cream Apply 1 application topically daily as needed (when accessing port).     [provider]  mirtazapine (REMERON) 15 MG tablet TAKE 1 TABLET BY MOUTH AT BEDTIME Patient taking differently: Take 15 mg by mouth at bedtime.  09/27/18   Alycia Rossetti, MD  Multiple Vitamin (MULTIVITAMIN WITH MINERALS) TABS tablet Take 1 tablet by mouth daily. Centrum    [provider]  oseltamivir (TAMIFLU) 75 MG capsule Take 1 capsule (75 mg total) by mouth every 12 (twelve) hours for 5 days. 02/16/19 02/21/19  Mirtha Jain C, PA-C  oxyCODONE-acetaminophen (PERCOCET/ROXICET) 5-325 MG tablet Take 1 tablet by mouth every 4 (four) hours as needed for moderate pain. 01/28/19   Alycia Rossetti, MD  SIMETHICONE PO Take 1-2 tablets by mouth daily as needed (gas/flatulence).    [provider]    sulfamethoxazole-trimethoprim (BACTRIM DS,SEPTRA DS) 800-160 MG tablet Take 1 tablet by mouth 2 (two) times daily. 02/04/19   [place], [name], MD  XTANDI 40 MG capsule TAKE 4 CAPSULES (160 MG TOTAL) BY MOUTH DAILY. 02/10/19   [name], MD    Family History Family History  Problem Relation Age of Onset  . Cancer Father        prostate   . Prostate cancer Father   . Colon cancer Father 32  . Hypertension Sister   . Cancer Sister   . Depression Sister   . Breast cancer Sister   . COPD Sister   . Aneurysm Brother        deceased, brain aneurysm    Social History Social History   Tobacco Use  . Smoking status: Former Smoker    Packs/day: 0.75    Years: 32.00    Pack years: 24.00    Types: Cigarettes    Last attempt to quit: 12/21/2009    Years since quitting: 9.1  . Smokeless tobacco: Never  Used  Substance Use Topics  . Alcohol use: No    Alcohol/week: 0.0 standard drinks    Comment: Former drinker  . Drug use: No     Allergies   Penicillins   Review of Systems Review of Systems  Constitutional: Positive for chills, fatigue and fever. Negative for activity change and appetite change.  HENT: Positive for congestion, rhinorrhea and sore throat. Negative for ear pain, sinus pressure and trouble swallowing.   Eyes: Negative for discharge and redness.  Respiratory: Positive for cough. Negative for chest tightness and shortness of breath.   Cardiovascular: Negative for chest pain.  Gastrointestinal: Negative for abdominal pain, diarrhea, nausea and vomiting.  Musculoskeletal: Negative for myalgias.  Skin: Negative for rash.  Neurological: Negative for dizziness, light-headedness and headaches.     Physical Exam Triage Vital Signs ED Triage Vitals [02/16/19 1117]  Enc Vitals Group     BP 135/89     Pulse Rate 91     Resp 18     Temp (!) 101.8 F (38.8 C)     Temp Source Oral     SpO2 100 %     Weight      Height      Head Circumference      Peak  Flow      Pain Score 6     Pain Loc      Pain Edu?      Excl. in [place]?    No data found.  Updated Vital Signs BP 135/89 (BP Location: Left Arm)   Pulse 91   Temp (!) 101.8 F (38.8 C) (Oral)   Resp 18   SpO2 100%   Visual Acuity Right Eye Distance:   Left Eye Distance:   Bilateral Distance:    Right Eye Near:   Left Eye Near:    Bilateral Near:     Physical Exam Vitals signs and nursing note reviewed.  Constitutional:      Appearance: He is well-developed.  HENT:     Head: Normocephalic and atraumatic.     Ears:     Comments: Bilateral ears without tenderness to palpation of external auricle, tragus and mastoid, EAC's without erythema or swelling, TM's with good bony landmarks and cone of light.  Left TM slightly opaque.    Mouth/Throat:     Comments: Oral mucosa pink and moist, no tonsillar enlargement or exudate. Posterior pharynx patent and nonerythematous, no uvula deviation or swelling. Normal phonation. Eyes:     Conjunctiva/sclera: Conjunctivae normal.  Neck:     Musculoskeletal: Neck supple.  Cardiovascular:     Rate and Rhythm: Normal rate and regular rhythm.     Heart sounds: No murmur.  Pulmonary:     Effort: Pulmonary effort is normal. No respiratory distress.     Breath sounds: Normal breath sounds.     Comments: Breathing comfortably at rest, CTABL, no wheezing, rales or other adventitious sounds auscultated Abdominal:     Palpations: Abdomen is soft.     Tenderness: There is no abdominal tenderness.  Skin:    General: Skin is warm and dry.  Neurological:     Mental Status: He is alert.      UC Treatments / Results  Labs (all labs ordered are listed, but only abnormal results are displayed) Labs Reviewed - No data to display  EKG None  Radiology No results found.  Procedures Procedures (including critical care time)  Medications Ordered in UC Medications  acetaminophen (TYLENOL) tablet 650 mg (  650 mg Oral Given 02/16/19 1131)     Initial Impression / Assessment and Plan / UC Course  I have reviewed the triage vital signs and the nursing notes.  Pertinent labs & imaging results that were available during my care of the patient were reviewed by me and considered in my medical decision making (see chart for details).     Acute onset of URI symptoms with fever, lungs clear, exam nonfocal.  Most likely influenza-like illness.  Will treat with Tamiflu twice daily x5 days, as well as further symptomatic and supportive care.  Push fluids, rest.  Continue to monitor,Discussed strict return precautions. Patient verbalized understanding and is agreeable with plan.  Final Clinical Impressions(s) / UC Diagnoses   Final diagnoses:  Influenza-like illness     Discharge Instructions     Symptoms most likely flu, begin tamiflu twice daily. Follow up if symptoms not resolving or wrosening  1. Take a daily allergy pill/anti-histamine like Zyrtec, Claritin, or Store brand consistently for 2 weeks  2. For congestion you may try an oral decongestant like Mucinex or sudafed. You may also try intranasal flonase nasal spray or saline irrigations (neti pot, sinus cleanse)  3. For your sore throat you may try cepacol lozenges, salt water gargles, throat spray. Treatment of congestion may also help your sore throat.  4. For cough you may try Tessalon provided, or Delsym, Robitussen, Mucinex DM  5. Take Tylenol or Ibuprofen to help with pain/inflammation  6. Stay hydrated, drink plenty of fluids to keep throat coated and less irritated  Honey Tea For cough/sore throat try using a honey-based tea. Use 3 teaspoons of honey with juice squeezed from half lemon. Place shaved pieces of ginger into 1/2-1 cup of water and warm over stove top. Then mix the ingredients and repeat every 4 hours as needed.   ED Prescriptions    Medication Sig Dispense Auth. Provider   fluticasone (FLONASE) 50 MCG/ACT nasal spray Place 1-2 sprays into both  nostrils daily for 7 days. 1 g Ulysees Robarts C, PA-C   benzonatate (TESSALON) 200 MG capsule Take 1 capsule (200 mg total) by mouth 3 (three) times daily as needed for up to 7 days for cough. 28 capsule Shaye Elling C, PA-C   oseltamivir (TAMIFLU) 75 MG capsule Take 1 capsule (75 mg total) by mouth every 12 (twelve) hours for 5 days. 10 capsule Emileo Semel C, PA-C     Controlled Substance Prescriptions [place] Controlled Substance Registry consulted? Not Applicable   Janith Lima, [place] 02/16/19 1217

## 2019-02-18 ENCOUNTER — Encounter: Payer: Medicare Other | Admitting: Family Medicine

## 2019-02-20 ENCOUNTER — Other Ambulatory Visit: Payer: Self-pay

## 2019-02-20 ENCOUNTER — Emergency Department (HOSPITAL_COMMUNITY): Payer: Medicare Other

## 2019-02-20 ENCOUNTER — Emergency Department (HOSPITAL_COMMUNITY)
Admission: EM | Admit: 2019-02-20 | Discharge: 2019-02-21 | Disposition: A | Discharge status: Home / Self Care | Payer: Medicare Other | Attending: Emergency Medicine | Admitting: Emergency Medicine

## 2019-02-20 DIAGNOSIS — Z87891 Personal history of nicotine dependence: Secondary | ICD-10-CM | POA: Diagnosis not present

## 2019-02-20 DIAGNOSIS — Z79899 Other long term (current) drug therapy: Secondary | ICD-10-CM | POA: Diagnosis not present

## 2019-02-20 DIAGNOSIS — J40 Bronchitis, not specified as acute or chronic: Secondary | ICD-10-CM | POA: Diagnosis not present

## 2019-02-20 DIAGNOSIS — J209 Acute bronchitis, unspecified: Secondary | ICD-10-CM | POA: Diagnosis not present

## 2019-02-20 DIAGNOSIS — R05 Cough: Secondary | ICD-10-CM | POA: Diagnosis present

## 2019-02-20 DIAGNOSIS — D649 Anemia, unspecified: Secondary | ICD-10-CM | POA: Insufficient documentation

## 2019-02-20 DIAGNOSIS — R0602 Shortness of breath: Secondary | ICD-10-CM | POA: Diagnosis not present

## 2019-02-20 DIAGNOSIS — R079 Chest pain, unspecified: Secondary | ICD-10-CM | POA: Diagnosis not present

## 2019-02-20 DIAGNOSIS — R0989 Other specified symptoms and signs involving the circulatory and respiratory systems: Secondary | ICD-10-CM | POA: Diagnosis not present

## 2019-02-20 LAB — BASIC METABOLIC PANEL
Anion gap: 8 (ref 5–15)
BUN: 11 mg/dL (ref 8–23)
CO2: 26 mmol/L (ref 22–32)
Calcium: 9.1 mg/dL (ref 8.9–10.3)
Chloride: 101 mmol/L (ref 98–111)
Creatinine, Ser: 1.04 mg/dL (ref 0.61–1.24)
GFR calc Af Amer: >60 mL/min (ref >60)
GFR calc non Af Amer: >60 mL/min (ref >60)
GLUCOSE: 87 mg/dL (ref 70–99)
Potassium: 5.2 mmol/L — ABNORMAL HIGH (ref 3.5–5.1)
Sodium: 135 mmol/L (ref 135–145)

## 2019-02-20 LAB — I-STAT TROPONIN, ED: Troponin i, poc: 0 ng/mL (ref 0.00–0.08)

## 2019-02-20 LAB — CBC WITH DIFFERENTIAL/PLATELET
Band Neutrophils: 0 %
Basophils Absolute: 0 10*3/uL (ref 0.0–0.1)
Basophils Relative: 0 %
Blasts: 0 %
Eosinophils Absolute: 0.3 10*3/uL (ref 0.0–0.5)
Eosinophils Relative: 5 %
HCT: 30.7 % — ABNORMAL LOW (ref 39.0–52.0)
Hemoglobin: 9.7 g/dL — ABNORMAL LOW (ref 13.0–17.0)
Lymphocytes Relative: 54 %
Lymphs Abs: 3.7 10*3/uL (ref 0.7–4.0)
MCH: 30.2 pg (ref 26.0–34.0)
MCHC: 31.6 g/dL (ref 30.0–36.0)
MCV: 95.6 fL (ref 80.0–100.0)
MYELOCYTES: 0 %
Metamyelocytes Relative: 0 %
Monocytes Absolute: 0.5 10*3/uL (ref 0.1–1.0)
Monocytes Relative: 7 %
NRBC: 0 % (ref 0.0–0.2)
Neutro Abs: 2.3 10*3/uL (ref 1.7–7.7)
Neutrophils Relative %: 34 %
Other: 0 %
PROMYELOCYTES RELATIVE: 0 %
Platelets: 314 10*3/uL (ref 150–400)
RBC: 3.21 MIL/uL — ABNORMAL LOW (ref 4.22–5.81)
RDW: 13 % (ref 11.5–15.5)
WBC: 6.8 10*3/uL (ref 4.0–10.5)
nRBC: 0 /100 WBC

## 2019-02-20 MED ORDER — ALBUTEROL SULFATE (2.5 MG/3ML) 0.083% IN NEBU
5.0000 mg | INHALATION_SOLUTION | Freq: Once | RESPIRATORY_TRACT | Status: AC
  Administered 2019-02-20: 5 mg via RESPIRATORY_TRACT
  Filled 2019-02-20: qty 6

## 2019-02-20 MED ORDER — IOHEXOL 350 MG/ML SOLN
75.0000 mL | Freq: Once | INTRAVENOUS | Status: AC | PRN
  Administered 2019-02-20: 75 mL via INTRAVENOUS

## 2019-02-20 NOTE — ED Provider Notes (Signed)
[facility] EMERGENCY DEPARTMENT Provider Note   CSN: [id number] Arrival date & time: 02/20/19  2016    History   Chief Complaint No chief complaint on file.   HPI Daniel Howard is a 63 y.o. male.     Patient is a 63 year old male with a history of metastatic prostate cancer, Crohn's disease, peptic ulcer disease, colostomy placement who presents with cough and shortness of breath.  He states that he was treated for the flu about a week or so ago and has had worsening cough and chest congestion since then.  He denies any recent fevers.  He does have some shortness of breath with minimal movement.  No associated chest pain although he has some soreness around his port in his right upper chest.  No vomiting or diarrhea.  His wife is concerned that he has been losing weight.  He is also had some pain in his right leg.  He has varicose veins in his right thigh which have been sore and yesterday his thigh was swollen although it seems to have resolved today.     Past Medical History:  Diagnosis Date  . Abnormal finding of biliary tract    MRCP shows pancreatic/biliary tract dilation. EUS 2010 confirmed dilation but no chronic pancreaitis or mass. Vascular ectasia crimpoing distal CBD.   [name] Anxiety   . 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 Duodenal ulcer 2010   nsaids  . History of blood transfusion 1992   "related to colon OR"  . Peristomal hernia   . Prostate cancer ([place]) 2018  . SBO (small bowel obstruction) ([place]) 11/10/2018  . Small bowel obstruction ([place]) 10/2017; 11/21/2017; 02/16/2018  . Spigelian hernia    bilateral      Patient Active Problem List   Diagnosis Date Noted  . Incarcerated hernia 01/09/2019  . Palliative care encounter   . Small bowel obstruction due to adhesions ([place]) 12/29/2018  . Small bowel obstruction ([place]) 12/03/2018  . Partial small bowel obstruction ([place]) 11/01/2018  . SBO (small bowel obstruction) ([place]) 09/30/2018  . Iron deficiency anemia 07/03/2018  . 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 Nausea without vomiting 11/10/2017  . Incisional hernia 09/22/2017  . Abdominal pain of multiple sites 09/22/2017  . Goals of care, counseling/discussion 04/02/2017  . Prostate cancer metastatic to bone ([place]) 04/02/2017  . Elevated PSA 03/04/2017  . Osteopenia 11/03/2016  . Pelvic mass in male 09/04/2016  . 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 Exacerbation of Crohn's disease ([place]) 09/04/2016  . Family history of colon cancer 10/12/2014  . Protein-calorie malnutrition, severe ([place]) 07/27/2014  . Loss of weight 06/09/2014  . Crohn's disease of both small and large intestine with complication ([place]) 31/54/0086  . Boils 02/11/2013  . Ventral hernia 12/11/2009  . Anemia 12/03/2009  . Regional enteritis/Crohn's 01/25/2007    Past Surgical History:  Procedure Laterality Date  . APPENDECTOMY  1992   at time of exp laparotomy at which time he was noted to have fistulizing Crohn's rather than UC  . COLON SURGERY    . COLONOSCOPY [name] 08/23/2014   PYP:[id number] proctoscopy with possible fistulous opening in thebase of rectal/anal stump.    . COLOSTOMY  1992   transverse loop colostomy secondary to a stricture  . ESOPHAGOGASTRODUODENOSCOPY  05/2009   SLF: multiple antral erosions, large ulcer at ansatomosis (postsurgical changes at duodenal bulb and second portion of duodenum) BX c/x NSAIDS.  . EUS  10/04/2009   Dr. Estill Bakes dilated CBD and main pancreatic duct.  No pancreatic  . EXPLORATORY LAPAROTOMY  1992  . FLEXIBLE SIGMOIDOSCOPY  1988   Dr. Laural Golden- suggested rohn's disease but the  biopsies were not collaborative.  [name] FLEXIBLE SIGMOIDOSCOPY [name] 09/08/2016   Procedure: FLEXIBLE SIGMOIDOSCOPY;  Surgeon: Wonda Horner, MD;  Location: [facility] ENDOSCOPY;  Service: Gastroenterology;  Laterality: [name];  . HEMICOLOECTOMY W/ ANASTOMOSIS  1993   R- Dr.DeMason   . HERNIA REPAIR  1996   incarcerated periostial hernia with additional surgery in 1999  . IR FLUORO GUIDE PORT INSERTION RIGHT  04/03/2017  . IR US GUIDE VASC ACCESS RIGHT  04/03/2017        Home Medications    Prior to Admission medications   Medication Sig Start Date End Date Taking? Authorizing Provider  Adalimumab (HUMIRA PEN) 40 MG/0.8ML PNKT Inject 1 Syringe into the skin once a week. Patient taking differently: Inject 40 mg into the skin every Wednesday.  09/01/18   Annitta Needs, NP  benzonatate (TESSALON) 200 MG capsule Take 1 capsule (200 mg total) by mouth 3 (three) times daily as needed for up to 7 days for cough. 02/16/19 02/23/19  Wieters, Hallie C, PA-C  budesonide (ENTOCORT EC) 3 MG 24 hr capsule TAKE 3 CAPSULES BY MOUTH DAILY. 02/07/19   Carlis Stable, NP  Calcium Carb-Cholecalciferol (CALCIUM 600-D PO) Take 2 tablets by mouth daily.    [provider]  doxycycline (VIBRAMYCIN) 100 MG capsule Take 1 capsule (100 mg total) by mouth 2 (two) times daily. One po bid x 7 days 02/21/19   Malvin Johns, MD  ferrous sulfate 325 (65 FE) MG tablet Take 325 mg by mouth daily with breakfast.    [provider]  fluticasone (FLONASE) 50 MCG/ACT nasal spray Place 1-2 sprays into both nostrils daily for 7 days. 02/16/19 02/23/19  Wieters, Hallie C, PA-C  lidocaine-prilocaine (EMLA) cream Apply 1 application topically daily as needed (when accessing port).     [provider]  mirtazapine (REMERON) 15 MG tablet TAKE 1 TABLET BY MOUTH AT BEDTIME Patient taking differently: Take 15 mg by mouth at bedtime.  09/27/18   Alycia Rossetti, MD  Multiple Vitamin (MULTIVITAMIN WITH MINERALS) TABS tablet Take 1 tablet by  mouth daily. Centrum    [provider]  oseltamivir (TAMIFLU) 75 MG capsule Take 1 capsule (75 mg total) by mouth every 12 (twelve) hours for 5 days. 02/16/19 02/21/19  Wieters, Hallie C, PA-C  oxyCODONE-acetaminophen (PERCOCET/ROXICET) 5-325 MG tablet Take 1 tablet by mouth every 4 (four) hours as needed for moderate pain. 01/28/19   Alycia Rossetti, MD  SIMETHICONE PO Take 1-2 tablets by mouth daily as needed (gas/flatulence).    [provider]  sulfamethoxazole-trimethoprim (BACTRIM DS,SEPTRA DS) 800-160 MG tablet Take 1 tablet by mouth 2 (two) times daily. 02/04/19   Marcus Hook, [name], MD  XTANDI 40 MG capsule TAKE 4 CAPSULES (160 MG TOTAL) BY MOUTH DAILY. Patient taking differently: Take 160 mg by mouth daily.  02/10/19   [name], MD    Family History Family History  Problem Relation Age of Onset  . Cancer Father        prostate   . Prostate  cancer Father   . Colon cancer Father 52  . Hypertension Sister   . Cancer Sister   . Depression Sister   . Breast cancer Sister   . COPD Sister   . Aneurysm Brother        deceased, brain aneurysm    Social History Social History   Tobacco Use  . Smoking status: Former Smoker    Packs/day: 0.75    Years: 32.00    Pack years: 24.00    Types: Cigarettes    Last attempt to quit: 12/21/2009    Years since quitting: 9.1  . Smokeless tobacco: Never Used  Substance Use Topics  . Alcohol use: No    Alcohol/week: 0.0 standard drinks    Comment: Former drinker  . Drug use: No     Allergies   Penicillins   Review of Systems Review of Systems  Constitutional: Positive for fatigue and unexpected weight change. Negative for chills, diaphoresis and fever.  HENT: Negative for congestion, rhinorrhea and sneezing.   Eyes: Negative.   Respiratory: Positive for cough and shortness of breath. Negative for chest tightness.   Cardiovascular: Positive for leg swelling. Negative for chest pain.  Gastrointestinal:  Negative for abdominal pain, blood in stool, diarrhea, nausea and vomiting.  Genitourinary: Negative for difficulty urinating, flank pain, frequency and hematuria.  Musculoskeletal: Negative for arthralgias and back pain.  Skin: Negative for rash.  Neurological: Negative for dizziness, speech difficulty, weakness, numbness and headaches.     Physical Exam Updated Vital Signs BP 121/82   Pulse 62   Temp 98.5 F (36.9 C) (Oral)   Resp 17   SpO2 99%   Physical Exam Constitutional:      Appearance: He is well-developed.  HENT:     Head: Normocephalic and atraumatic.  Eyes:     Pupils: Pupils are equal, round, and reactive to light.  Neck:     Musculoskeletal: Normal range of motion and neck supple.  Cardiovascular:     Rate and Rhythm: Normal rate and regular rhythm.     Heart sounds: Normal heart sounds.  Pulmonary:     Effort: Pulmonary effort is normal. No respiratory distress.     Breath sounds: Wheezing and rhonchi present. No rales.  Chest:     Chest wall: No tenderness.  Abdominal:     General: Bowel sounds are normal.     Palpations: Abdomen is soft.     Tenderness: There is no abdominal tenderness. There is no guarding or rebound.  Musculoskeletal: Normal range of motion.     Comments: Patient has no obvious swelling in his right leg as compared to his left but his wife thinks it looks a little bit more swollen than normal.  He does have some varicose veins in his thigh which are nontender.  Pedal pulses are intact.  Lymphadenopathy:     Cervical: No cervical adenopathy.  Skin:    General: Skin is warm and dry.     Findings: No rash.  Neurological:     Mental Status: He is alert and oriented to person, place, and time.      ED Treatments / Results  Labs (all labs ordered are listed, but only abnormal results are displayed) Labs Reviewed  BASIC METABOLIC PANEL - Abnormal; Notable for the following components:      Result Value   Potassium 5.2 (*)    All  other components within normal limits  CBC WITH DIFFERENTIAL/PLATELET - Abnormal; Notable for the following components:  RBC 3.21 (*)    Hemoglobin 9.7 (*)    HCT 30.7 (*)    All other components within normal limits  CBC WITH DIFFERENTIAL/PLATELET  I-STAT TROPONIN, ED    EKG EKG Interpretation  Date/Time:  Sunday February 20 2019 20:34:09 EST Ventricular Rate:  60 PR Interval:    QRS Duration: 72 QT Interval:  422 QTC Calculation: 422 R Axis:   51 Text Interpretation:  Sinus rhythm Anteroseptal infarct, age indeterminate since last tracing no significant change Confirmed by Malvin Johns [phone]) on 02/20/2019 11:37:35 PM   Radiology Dg Chest 2 View  Result Date: 02/20/2019 CLINICAL DATA:  Congestion EXAM: CHEST - 2 VIEW COMPARISON:  10/26/2018 FINDINGS: Right-sided central venous port tip over the SVC. Hyperinflated lungs without acute opacity or pleural effusion. Stable cardiomediastinal silhouette. No pneumothorax. IMPRESSION: No active cardiopulmonary disease. Electronically Signed   By: Donavan Foil M.D.   On: 02/20/2019 22:10   Ct Angio Chest Pe W/cm &/or Wo Cm  Result Date: 02/20/2019 CLINICAL DATA:  PE suspected, high pretest prob. Chest pain. EXAM: CT ANGIOGRAPHY CHEST WITH CONTRAST TECHNIQUE: Multidetector CT imaging of the chest was performed using the standard protocol during bolus administration of intravenous contrast. Multiplanar CT image reconstructions and MIPs were obtained to evaluate the vascular anatomy. CONTRAST:  76m OMNIPAQUE IOHEXOL 350 MG/ML SOLN COMPARISON:  Radiograph earlier this day. No prior chest CT available. FINDINGS: Cardiovascular: There are no filling defects within the pulmonary arteries to suggest pulmonary embolus. Thoracic aorta is normal in caliber, suboptimal aortic opacification to assess for dissection. Heart is normal in size. No pericardial effusion. Right chest port with tip at the atrial caval junction. Mediastinum/Nodes: Multifocal  mediastinal and right hilar adenopathy. Largest lymph node/nodal conglomerate and in the highest mediastinum measures 2.5 cm. Additional anterior paratracheal node measures 16 mm short axis, with the 14 mm prevascular node and 12 mm right hilar node. Additional prominent mildly enlarged lymph nodes at additional stations. Suspect left supraclavicular adenopathy, partially obscured by streak artifact from dense IV contrast. Esophagus is decompressed. Lungs/Pleura: Moderate central bronchial thickening with areas of bronchial filling/mucous impaction greatest in the left lower lobe. No confluent airspace disease. 3 mm perifissural nodule in the right upper lobe, image 49 series 11. Minor apical emphysema. No evidence of pulmonary edema. No pleural fluid. Upper Abdomen: No acute findings. Musculoskeletal: Small sclerotic density in the left lateral first rib. No acute osseous abnormalities. Review of the MIP images confirms the above findings. IMPRESSION: 1. No pulmonary embolus. 2. Multifocal mediastinal and right hilar adenopathy, largest node or nodal conglomerate measuring 2.5 cm. Suspect left supraclavicular adenopathy, partially obscured. Findings are suspicious for metastatic disease, however nonspecific. Recommend correlation with any prior outside imaging if available as well as clinical history and thoracic consultation. In the absence of prior imaging, recommend either PET-CT or follow-up chest CT in 3 months. 3. Moderate central bronchial thickening with areas of bronchial mucous plugging. 4. Small sclerotic density in the left lateral first rib is nonspecific in the setting of prostate cancer. 5. Small perifissural nodule in the right upper lobe. No follow-up needed if patient is low-risk. Non-contrast chest CT can be considered in 12 months if patient is high-risk. This recommendation follows the consensus statement: Guidelines for Management of Incidental Pulmonary Nodules Detected on CT Images: From the  Fleischner Society 2017; Radiology 2017; 284:228-243. Emphysema (ICD10-J43.9). Electronically Signed   By: MKeith RakeM.D.   On: 02/20/2019 23:51    Procedures Procedures (including critical care  time)  Medications Ordered in ED Medications  albuterol (PROVENTIL HFA;VENTOLIN HFA) 108 (90 Base) MCG/ACT inhaler 1-2 puff (has no administration in time range)  albuterol (PROVENTIL) (2.5 MG/3ML) 0.083% nebulizer solution 5 mg (5 mg Nebulization Given 02/20/19 2250)  iohexol (OMNIPAQUE) 350 MG/ML injection 75 mL (75 mLs Intravenous Contrast Given 02/20/19 2301)     Initial Impression / Assessment and Plan / ED Course  I have reviewed the triage vital signs and the nursing notes.  Pertinent labs & imaging results that were available during my care of the patient were reviewed by me and considered in my medical decision making (see chart for details).        Patient is a 63 year old male who presents with cough and congestion.  His lungs sound rhonchorous and wheezy.  His breathing improved after nebulizer treatment.  He has no hypoxia or increased work of breathing.  No fever.  Chest x-ray shows no evidence of pneumonia.  He has a questionable right leg swelling and is fairly high risk for DVT.  Were unable to get a Doppler ultrasound tonight so I have ordered 1 for tomorrow.  I did do a CT scan given his shortness of breath which shows no evidence of PE.  There is some enlarged lymph nodes and possible metastatic disease which is consistent with patient's diagnosis.  He has some anemia which is slightly worse than his prior values.  Otherwise he is well-appearing.  He was discharged home in good condition.  He was encouraged to have close follow-up with his PCP.  Return precautions were given.  Final Clinical Impressions(s) / ED Diagnoses   Final diagnoses:  Anemia, unspecified type  Bronchitis    ED Discharge Orders         Ordered    doxycycline (VIBRAMYCIN) 100 MG capsule  2 times  daily     02/21/19 0022    VAS [place] LOWER EXTREMITY VENOUS (DVT)     02/21/19 0023           Malvin Johns, MD 02/21/19 0025

## 2019-02-20 NOTE — ED Notes (Signed)
Patient transported to CT 

## 2019-02-20 NOTE — ED Notes (Signed)
Lab called that blood specimen clotted.  Labs resent at 2127

## 2019-02-20 NOTE — ED Notes (Signed)
Patient transported to X-ray 

## 2019-02-20 NOTE — ED Triage Notes (Signed)
Pt here with chest congestion for the last week.  Also complaining of losing a lot of weight quickly.  Pt reports right sided leg swelling 3 days ago that has now resolved.  Reporting port pain to the right chest for months. No shortness of breath, N/V.

## 2019-02-21 ENCOUNTER — Ambulatory Visit (HOSPITAL_BASED_OUTPATIENT_CLINIC_OR_DEPARTMENT_OTHER)
Admission: RE | Admit: 2019-02-21 | Discharge: 2019-02-21 | Disposition: A | Discharge status: Home / Self Care | Payer: Medicare Other | Source: Ambulatory Visit | Attending: Emergency Medicine | Admitting: Emergency Medicine

## 2019-02-21 DIAGNOSIS — J209 Acute bronchitis, unspecified: Secondary | ICD-10-CM | POA: Diagnosis not present

## 2019-02-21 DIAGNOSIS — M7989 Other specified soft tissue disorders: Secondary | ICD-10-CM

## 2019-02-21 MED ORDER — DOXYCYCLINE HYCLATE 100 MG PO CAPS: 100.0000 mg | ORAL_CAPSULE | Freq: Two times a day (BID) | ORAL | 0 refills | Status: DC

## 2019-02-21 MED ORDER — ALBUTEROL SULFATE HFA 108 (90 BASE) MCG/ACT IN AERS
1.0000 | INHALATION_SPRAY | Freq: Once | RESPIRATORY_TRACT | Status: DC
  Filled 2019-02-21: qty 6.7

## 2019-02-21 NOTE — ED Notes (Signed)
Patient Alert and oriented to baseline. Stable and ambulatory to baseline. Patient verbalized understanding of the discharge instructions.  Patient belongings were taken by the patient.   

## 2019-02-21 NOTE — ED Notes (Signed)
ED Provider at bedside. 

## 2019-02-22 MED FILL — XTANDI 40 MG CAPSULE: 40 | 30 days supply | Qty: 120 | Fill #0

## 2019-03-03 ENCOUNTER — Ambulatory Visit: Payer: Medicare Other | Admitting: Gastroenterology

## 2019-03-09 DIAGNOSIS — Z933 Colostomy status: Secondary | ICD-10-CM | POA: Diagnosis not present

## 2019-03-09 DIAGNOSIS — K5090 Crohn's disease, unspecified, without complications: Secondary | ICD-10-CM | POA: Diagnosis not present

## 2019-03-11 ENCOUNTER — Ambulatory Visit: Payer: Medicare Other | Admitting: Internal Medicine

## 2019-03-11 ENCOUNTER — Other Ambulatory Visit: Payer: Self-pay | Admitting: Oncology

## 2019-03-11 DIAGNOSIS — C61 Malignant neoplasm of prostate: Secondary | ICD-10-CM

## 2019-03-15 ENCOUNTER — Inpatient Hospital Stay: Payer: Medicare Other | Attending: Oncology

## 2019-03-15 ENCOUNTER — Other Ambulatory Visit: Payer: Self-pay

## 2019-03-15 DIAGNOSIS — Z452 Encounter for adjustment and management of vascular access device: Secondary | ICD-10-CM | POA: Insufficient documentation

## 2019-03-15 DIAGNOSIS — C61 Malignant neoplasm of prostate: Secondary | ICD-10-CM | POA: Diagnosis present

## 2019-03-15 DIAGNOSIS — C7951 Secondary malignant neoplasm of bone: Secondary | ICD-10-CM | POA: Insufficient documentation

## 2019-03-15 MED ORDER — HEPARIN SOD (PORK) LOCK FLUSH 100 UNIT/ML IV SOLN
500.0000 [IU] | Freq: Once | INTRAVENOUS | Status: AC | PRN
  Administered 2019-03-15: 500 [IU]
  Filled 2019-03-15: qty 5

## 2019-03-15 MED ORDER — SODIUM CHLORIDE 0.9% FLUSH
10.0000 mL | INTRAVENOUS | Status: DC | PRN
  Administered 2019-03-15: 10 mL
  Filled 2019-03-15: qty 10

## 2019-03-17 ENCOUNTER — Emergency Department (HOSPITAL_COMMUNITY): Payer: Medicare Other

## 2019-03-17 ENCOUNTER — Encounter (HOSPITAL_COMMUNITY): Payer: Self-pay | Admitting: *Deleted

## 2019-03-17 ENCOUNTER — Emergency Department (HOSPITAL_COMMUNITY)
Admission: EM | Admit: 2019-03-17 | Discharge: 2019-03-17 | Disposition: A | Discharge status: Home / Self Care | Payer: Medicare Other | Attending: Emergency Medicine | Admitting: Emergency Medicine

## 2019-03-17 ENCOUNTER — Other Ambulatory Visit: Payer: Self-pay

## 2019-03-17 DIAGNOSIS — Z8546 Personal history of malignant neoplasm of prostate: Secondary | ICD-10-CM | POA: Diagnosis not present

## 2019-03-17 DIAGNOSIS — K56609 Unspecified intestinal obstruction, unspecified as to partial versus complete obstruction: Secondary | ICD-10-CM | POA: Diagnosis not present

## 2019-03-17 DIAGNOSIS — Z87891 Personal history of nicotine dependence: Secondary | ICD-10-CM | POA: Diagnosis not present

## 2019-03-17 DIAGNOSIS — K56600 Partial intestinal obstruction, unspecified as to cause: Secondary | ICD-10-CM | POA: Insufficient documentation

## 2019-03-17 DIAGNOSIS — Z79899 Other long term (current) drug therapy: Secondary | ICD-10-CM | POA: Diagnosis not present

## 2019-03-17 DIAGNOSIS — R1084 Generalized abdominal pain: Secondary | ICD-10-CM | POA: Diagnosis not present

## 2019-03-17 DIAGNOSIS — R109 Unspecified abdominal pain: Secondary | ICD-10-CM | POA: Diagnosis not present

## 2019-03-17 LAB — COMPREHENSIVE METABOLIC PANEL
ALK PHOS: 55 U/L (ref 38–126)
ALT: 9 U/L (ref 0–44)
AST: 20 U/L (ref 15–41)
Albumin: 2.9 g/dL — ABNORMAL LOW (ref 3.5–5.0)
Anion gap: 8 (ref 5–15)
BUN: 9 mg/dL (ref 8–23)
CO2: 26 mmol/L (ref 22–32)
Calcium: 9.1 mg/dL (ref 8.9–10.3)
Chloride: 102 mmol/L (ref 98–111)
Creatinine, Ser: 0.99 mg/dL (ref 0.61–1.24)
GFR calc Af Amer: >60 mL/min (ref >60)
GFR calc non Af Amer: >60 mL/min (ref >60)
Glucose, Bld: 97 mg/dL (ref 70–99)
Potassium: 3.9 mmol/L (ref 3.5–5.1)
Sodium: 136 mmol/L (ref 135–145)
Total Bilirubin: 0.6 mg/dL (ref 0.3–1.2)
Total Protein: 7.4 g/dL (ref 6.5–8.1)

## 2019-03-17 LAB — CBC
HCT: 32.1 % — ABNORMAL LOW (ref 39.0–52.0)
HEMOGLOBIN: 10.5 g/dL — AB (ref 13.0–17.0)
MCH: 31.4 pg (ref 26.0–34.0)
MCHC: 32.7 g/dL (ref 30.0–36.0)
MCV: 96.1 fL (ref 80.0–100.0)
Platelets: 362 10*3/uL (ref 150–400)
RBC: 3.34 MIL/uL — ABNORMAL LOW (ref 4.22–5.81)
RDW: 13.6 % (ref 11.5–15.5)
WBC: 9.1 10*3/uL (ref 4.0–10.5)
nRBC: 0 % (ref 0.0–0.2)

## 2019-03-17 MED ORDER — MORPHINE SULFATE (PF) 2 MG/ML IV SOLN
2.0000 mg | Freq: Once | INTRAVENOUS | Status: AC
  Administered 2019-03-17: 2 mg via INTRAVENOUS
  Filled 2019-03-17: qty 1

## 2019-03-17 MED ORDER — ONDANSETRON HCL 4 MG/2ML IJ SOLN
4.0000 mg | Freq: Once | INTRAMUSCULAR | Status: AC
  Administered 2019-03-17: 4 mg via INTRAVENOUS
  Filled 2019-03-17: qty 2

## 2019-03-17 MED ORDER — SODIUM CHLORIDE 0.9 % IV BOLUS
1000.0000 mL | Freq: Once | INTRAVENOUS | Status: AC
  Administered 2019-03-17: 1000 mL via INTRAVENOUS

## 2019-03-17 NOTE — Discharge Instructions (Addendum)
It was our pleasure to provide your ER care today - we hope that you feel better.  It appears you may have recurrent, partial small bowel obstructive symptoms - for next 1-2 days, clear liquid diet, then slowly advance diet as tolerated.   Follow up with primary care doctor in the next couple of days.   Return to ER if symptoms worse, new symptoms,  fevers, persistent/recurrent vomiting, worsening or severe abdominal pain, other concern.

## 2019-03-17 NOTE — ED Triage Notes (Signed)
PT reports after eating he has ABD  Pain . Pt currently has a colostomy.

## 2019-03-17 NOTE — ED Provider Notes (Signed)
[facility] EMERGENCY DEPARTMENT Provider Note   CSN: [id number] Arrival date & time: 03/17/19  1323    History   Chief Complaint Chief Complaint  Patient presents with  . Abdominal Pain    HPI Daniel Howard is a 63 y.o. male.     Patient c/o crampy abdominal pain in the past 3-4 days. Pain dull, cramping, diffuse, non radiating, without consistently exacerbating or alleviating factors. Hx crohn's disease, ileostomy, multiple prior abd surgeries, multiple sbo, chronic left large abd wall hernia. Indicates normal ostomy output. No vomiting or abd distension. No fever or chills. No dysuria or gu c/o.   The history is provided by the patient.  Abdominal Pain  Associated symptoms: no chest pain, no constipation, no dysuria, no fever, no shortness of breath, no sore throat and no vomiting     Past Medical History:  Diagnosis Date  . Abnormal finding of biliary tract    MRCP shows pancreatic/biliary tract dilation. EUS 2010 confirmed dilation but no chronic pancreaitis or mass. Vascular ectasia crimpoing distal CBD.   [name] Anxiety   . 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 Duodenal ulcer 2010   nsaids  . History of blood transfusion 1992   "related to colon OR"  . Peristomal hernia   . Prostate cancer ([place]) 2018  . SBO (small bowel obstruction) ([place]) 11/10/2018  . Small bowel obstruction ([place]) 10/2017; 11/21/2017; 02/16/2018  . Spigelian hernia    bilateral    Patient Active Problem List   Diagnosis Date Noted  . Incarcerated hernia 01/09/2019  . Palliative care encounter   . Small bowel obstruction due to adhesions  ([place]) 12/29/2018  . Small bowel obstruction ([place]) 12/03/2018  . Partial small bowel obstruction ([place]) 11/01/2018  . SBO (small bowel obstruction) ([place]) 09/30/2018  . Iron deficiency anemia 07/03/2018  . 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 Nausea without vomiting 11/10/2017  . Incisional hernia 09/22/2017  . Abdominal pain of multiple sites 09/22/2017  . Goals of care, counseling/discussion 04/02/2017  . Prostate cancer metastatic to bone ([place]) 04/02/2017  . Elevated PSA 03/04/2017  . Osteopenia 11/03/2016  . Pelvic mass in male 09/04/2016  . 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 Exacerbation of Crohn's disease ([place]) 09/04/2016  . Family history of colon cancer 10/12/2014  . Protein-calorie malnutrition, severe ([place]) 07/27/2014  . Loss of weight 06/09/2014  . Crohn's disease of both small and large intestine with complication ([place]) 35/57/3220  . Boils 02/11/2013  . Ventral hernia 12/11/2009  . Anemia 12/03/2009  . Regional enteritis/Crohn's 01/25/2007    Past Surgical History:  Procedure Laterality Date  . APPENDECTOMY  1992   at time of exp laparotomy at which time he was noted to have fistulizing Crohn's rather than UC  . COLON SURGERY    . COLONOSCOPY [name] 08/23/2014   URK:YHCWCBJ proctoscopy with possible fistulous opening in thebase of rectal/anal stump.    . COLOSTOMY  1992   transverse loop colostomy secondary to a stricture  . ESOPHAGOGASTRODUODENOSCOPY  05/2009   SLF: multiple antral erosions, large ulcer at ansatomosis (postsurgical changes at duodenal bulb and second portion of duodenum) BX c/x NSAIDS.  . EUS  10/04/2009   Dr.  Jacobs-Mildly dilated CBD and main pancreatic duct.  No pancreatic  . EXPLORATORY LAPAROTOMY  1992  . FLEXIBLE SIGMOIDOSCOPY  1988   Dr. Laural Golden- suggested rohn's disease but the biopsies were not collaborative.  [name] FLEXIBLE SIGMOIDOSCOPY [name] 09/08/2016   Procedure: FLEXIBLE SIGMOIDOSCOPY;  Surgeon: Wonda Horner, MD;  Location: [facility] ENDOSCOPY;  Service:  Gastroenterology;  Laterality: [name];  . HEMICOLOECTOMY W/ ANASTOMOSIS  1993   R- Dr.DeMason   . HERNIA REPAIR  1996   incarcerated periostial hernia with additional surgery in 1999  . IR FLUORO GUIDE PORT INSERTION RIGHT  04/03/2017  . IR US GUIDE VASC ACCESS RIGHT  04/03/2017        Home Medications    Prior to Admission medications   Medication Sig Start Date End Date Taking? Authorizing Provider  Adalimumab (HUMIRA PEN) 40 MG/0.8ML PNKT Inject 1 Syringe into the skin once a week. Patient taking differently: Inject 40 mg into the skin every Wednesday.  09/01/18   Annitta Needs, NP  budesonide (ENTOCORT EC) 3 MG 24 hr capsule TAKE 3 CAPSULES BY MOUTH DAILY. 02/07/19   Carlis Stable, NP  Calcium Carb-Cholecalciferol (CALCIUM 600-D PO) Take 2 tablets by mouth daily.    [provider]  doxycycline (VIBRAMYCIN) 100 MG capsule Take 1 capsule (100 mg total) by mouth 2 (two) times daily. One po bid x 7 days 02/21/19   Malvin Johns, MD  ferrous sulfate 325 (65 FE) MG tablet Take 325 mg by mouth daily with breakfast.    [provider]  fluticasone (FLONASE) 50 MCG/ACT nasal spray Place 1-2 sprays into both nostrils daily for 7 days. 02/16/19 02/23/19  Wieters, Hallie C, PA-C  lidocaine-prilocaine (EMLA) cream Apply 1 application topically daily as needed (when accessing port).     [provider]  mirtazapine (REMERON) 15 MG tablet TAKE 1 TABLET BY MOUTH AT BEDTIME Patient taking differently: Take 15 mg by mouth at bedtime.  09/27/18   Alycia Rossetti, MD  Multiple Vitamin (MULTIVITAMIN WITH MINERALS) TABS tablet Take 1 tablet by mouth daily. Centrum    [provider]  oxyCODONE-acetaminophen (PERCOCET/ROXICET) 5-325 MG tablet Take 1 tablet by mouth every 4 (four) hours as needed for moderate pain. 01/28/19   Alycia Rossetti, MD  SIMETHICONE PO Take 1-2 tablets by mouth daily as needed (gas/flatulence).    [provider]  sulfamethoxazole-trimethoprim  (BACTRIM DS,SEPTRA DS) 800-160 MG tablet Take 1 tablet by mouth 2 (two) times daily. 02/04/19   [place], [name], MD  XTANDI 40 MG capsule TAKE 4 CAPSULES (160 MG TOTAL) BY MOUTH DAILY. 03/11/19   [name], MD    Family History Family History  Problem Relation Age of Onset  . Cancer Father        prostate   . Prostate cancer Father   . Colon cancer Father 69  . Hypertension Sister   . Cancer Sister   . Depression Sister   . Breast cancer Sister   . COPD Sister   . Aneurysm Brother        deceased, brain aneurysm    Social History Social History   Tobacco Use  . Smoking status: Former Smoker    Packs/day: 0.75    Years: 32.00    Pack years: 24.00    Types: Cigarettes    Last attempt to quit: 12/21/2009    Years since quitting: 9.2  . Smokeless tobacco: Never Used  Substance Use Topics  . Alcohol use: No  Alcohol/week: 0.0 standard drinks    Comment: Former drinker  . Drug use: No     Allergies   Penicillins   Review of Systems Review of Systems  Constitutional: Negative for fever.  HENT: Negative for sore throat.   Eyes: Negative for redness.  Respiratory: Negative for shortness of breath.   Cardiovascular: Negative for chest pain.  Gastrointestinal: Positive for abdominal pain. Negative for constipation and vomiting.  Endocrine: Negative for polyuria.  Genitourinary: Negative for dysuria and flank pain.  Musculoskeletal: Negative for back pain and neck pain.  Skin: Negative for rash.  Neurological: Negative for headaches.  Hematological: Does not bruise/bleed easily.  Psychiatric/Behavioral: Negative for confusion.     Physical Exam Updated Vital Signs BP 126/82 (BP Location: Right Arm)   Pulse 77   Temp 98.7 F (37.1 C) (Oral)   Resp 16   Ht 1.638 m (5' 4.5")   Wt 56.2 kg   SpO2 99%   BMI 20.96 kg/m   Physical Exam Vitals signs and nursing note reviewed.  Constitutional:      Appearance: Normal appearance. He is well-developed.   HENT:     Nose: Nose normal.     Mouth/Throat:     Mouth: Mucous membranes are moist.     Pharynx: Oropharynx is clear.  Eyes:     General: No scleral icterus.    Conjunctiva/sclera: Conjunctivae normal.     Pupils: Pupils are equal, round, and reactive to light.  Neck:     Musculoskeletal: Normal range of motion and neck supple. No neck rigidity.     Trachea: No tracheal deviation.  Cardiovascular:     Rate and Rhythm: Normal rate and regular rhythm.     Pulses: Normal pulses.     Heart sounds: Normal heart sounds. No murmur. No friction rub. No gallop.   Pulmonary:     Effort: Pulmonary effort is normal. No accessory muscle usage or respiratory distress.     Breath sounds: Normal breath sounds.  Abdominal:     General: Bowel sounds are normal. There is no distension.     Palpations: Abdomen is soft.     Tenderness: There is no abdominal tenderness. There is no guarding.     Comments: Large soft left abd wall hernia. Ostomy pink, patent, functioning with stool coming out of ostomy into bag.   Genitourinary:    Comments: No cva tenderness. Musculoskeletal:        General: No swelling.  Skin:    General: Skin is warm and dry.     Findings: No rash.  Neurological:     Mental Status: He is alert.     Comments: Alert, speech clear.   Psychiatric:        Mood and Affect: Mood normal.      ED Treatments / Results  Labs (all labs ordered are listed, but only abnormal results are displayed) Results for orders placed or performed during the hospital encounter of 03/17/19  CBC  Result Value Ref Range   WBC 9.1 4.0 - 10.5 K/uL   RBC 3.34 (L) 4.22 - 5.81 MIL/uL   Hemoglobin 10.5 (L) 13.0 - 17.0 g/dL   HCT 32.1 (L) 39.0 - 52.0 %   MCV 96.1 80.0 - 100.0 fL   MCH 31.4 26.0 - 34.0 pg   MCHC 32.7 30.0 - 36.0 g/dL   RDW 13.6 11.5 - 15.5 %   Platelets 362 150 - 400 K/uL   nRBC 0.0 0.0 - 0.2 %  Comprehensive metabolic panel  Result Value Ref Range   Sodium 136 135 - 145 mmol/L    Potassium 3.9 3.5 - 5.1 mmol/L   Chloride 102 98 - 111 mmol/L   CO2 26 22 - 32 mmol/L   Glucose, Bld 97 70 - 99 mg/dL   BUN 9 8 - 23 mg/dL   Creatinine, Ser 0.99 0.61 - 1.24 mg/dL   Calcium 9.1 8.9 - 10.3 mg/dL   Total Protein 7.4 6.5 - 8.1 g/dL   Albumin 2.9 (L) 3.5 - 5.0 g/dL   AST 20 15 - 41 U/L   ALT 9 0 - 44 U/L   Alkaline Phosphatase 55 38 - 126 U/L   Total Bilirubin 0.6 0.3 - 1.2 mg/dL   GFR calc non Af Amer >60 >60 mL/min   GFR calc Af Amer >60 >60 mL/min   Anion gap 8 5 - 15   D EKG None  Radiology Dg Abd Acute 2+v W 1v Chest  Result Date: 03/17/2019 CLINICAL DATA:  Acute generalized abdominal pain, history of Crohn's disease. EXAM: DG ABDOMEN ACUTE W/ 1V CHEST COMPARISON:  Radiograph of January 08, 2019. FINDINGS: There is no evidence of free intraperitoneal air. Mildly dilated small bowel loops are noted with air-fluid levels concerning for distal small bowel obstruction. No colonic dilatation is noted. Phleboliths are noted in the pelvis. Heart size and mediastinal contours are within normal limits. Both lungs are clear. IMPRESSION: Mildly dilated small bowel loops are noted with air-fluid levels concerning for possible distal small bowel obstruction. No acute cardiopulmonary disease. Electronically Signed   By: Marijo Conception, M.D.   On: 03/17/2019 14:58    Procedures Procedures (including critical care time)  Medications Ordered in ED Medications - No data to display   Initial Impression / Assessment and Plan / ED Course  I have reviewed the triage vital signs and the nursing notes.  Pertinent labs & imaging results that were available during my care of the patient were reviewed by me and considered in my medical decision making (see chart for details).  Labs sent.  Iv ns bolus. Morphine iv. zofran iv.   Reviewed nursing notes and prior charts for additional history.   Imaging obtained.   xrays reviewed - aflevels.   Labs reviewed -  Wbc normal, chem  normal.  On review prior charts, imaging - pt with hx chronic, recurrent partial sbo syndrome.   During period of observation in ED, no vomiting, patient is tolerating po fluids well. No abd distension or tenderness on exam. Pt has ostomy output, c/w his normal.  Suspect mild/partial, recurrent sbo symptoms, but currently in no pain, no nausea, and tolerating po - pt currently appears stable for d/c.   rec pcp f/u.   Return precautions provided.     Final Clinical Impressions(s) / ED Diagnoses   Final diagnoses:  None    ED Discharge Orders    None       Lajean Saver, MD 03/17/19 1722

## 2019-03-17 NOTE — ED Notes (Signed)
Patient verbalizes understanding of discharge instructions . Opportunity for questions and answers were provided . Armband removed by staff ,Pt discharged from ED. W/C  offered at D/C  and Declined W/C at D/C and was escorted to lobby by RN.  

## 2019-03-17 NOTE — ED Triage Notes (Signed)
Pt given a soda to drink.

## 2019-03-17 NOTE — ED Triage Notes (Signed)
PT did not want his poet accessbecause he did not have his numbing cream .

## 2019-03-21 ENCOUNTER — Other Ambulatory Visit: Payer: Self-pay | Admitting: Family Medicine

## 2019-03-23 ENCOUNTER — Other Ambulatory Visit: Payer: Self-pay | Admitting: *Deleted

## 2019-03-23 DIAGNOSIS — C7951 Secondary malignant neoplasm of bone: Principal | ICD-10-CM

## 2019-03-23 DIAGNOSIS — C61 Malignant neoplasm of prostate: Secondary | ICD-10-CM

## 2019-03-23 MED ORDER — OXYCODONE-ACETAMINOPHEN 5-325 MG PO TABS: 1.0000 | ORAL_TABLET | ORAL | 0 refills | Status: DC | PRN

## 2019-03-23 MED FILL — XTANDI 40 MG CAPSULE: 40 | 30 days supply | Qty: 120 | Fill #0

## 2019-03-23 NOTE — Telephone Encounter (Signed)
Received call from patient.   Requested refill on Oxycodone/APAP.   Ok to refill??  Last office visit 02/04/2019.  Last refill 01/28/2019.

## 2019-03-24 ENCOUNTER — Other Ambulatory Visit: Payer: Self-pay

## 2019-03-24 ENCOUNTER — Other Ambulatory Visit: Payer: Medicare Other | Admitting: Adult Health Nurse Practitioner

## 2019-03-24 DIAGNOSIS — Z515 Encounter for palliative care: Secondary | ICD-10-CM

## 2019-03-24 NOTE — Progress Notes (Signed)
[facility] Consult Note Telephone: [phone]  Fax: [phone]  PATIENT NAME: Daniel Howard DOB: 1956/07/23 MRN: [id number]  PRIMARY CARE PROVIDER:   Alycia Rossetti, MD  REFERRING PROVIDER:  Alycia Rossetti, MD [address] [place], [place] [address]  RESPONSIBLE PARTY:   Self and wife, Daniel Howard: [phone]                           C: [phone]   Due to the COVID-19 crisis, this telephone evaluation and treatment contact was done via telephone and it was initiated and consent by this patient and or family.      RECOMMENDATIONS and PLAN:  1. Call was initiated by patient's wife who is concerned about patient's increased pain and not eating well.  Patient has had recent ER visit on 03/17/2019 for partial SBO.  Wife states that patient has had increased pain and has been taking 10 mg oxy at a time with little relief.  Has been taking the oxy PRN but has not gotten ahead of the pain.  Have suggested to go ahead and take 10 mg oxy tonight at bedtime and in the morning start giving 45m oxy Q 6 hrs on schedule for 3-4 days to see if this helps gives more consistent pain relief.  Did suggest to give an extra 5 mg oxy if no relief after an hour of taking the first 5 mg.    2.  Appetite. She also concerned about him not eating.  States that since his ER visit he has not been eating well and that he gets nauseous when he tries to eat. The SBO resolved on its own.  She does state that he has been having BMs with no constipation since the ER visit.  Have suggested that for the next 3 days to have him on a full liquid diet and if he is tolerating this well then he can progress to a soft diet.  Have scheduled a telehealth appointment for next Friday, 04/01/2019 at 10:00 am, to check to see how if these suggestions have helped with his pain and appetite.  Will discuss further options and concerns at that time.  Have advised the wife to  call before this appointment with any concerns or questions.  I spent 20 minutes providing this consultation,  from 3:15 to 3:35. More than 50% of the time in this consultation was spent coordinating communication.   HISTORY OF PRESENT ILLNESS:  CTAISEI BONNETTEis a 63y.o. year old male with multiple medical problems including prostate cancer, Crohn's . Palliative Care was asked to help address goals of care.   CODE STATUS: DNR  PPS: 60% HOSPICE ELIGIBILITY/DIAGNOSIS: TBD  PAST MEDICAL HISTORY:  Past Medical History:  Diagnosis Date  . Abnormal finding of biliary tract    MRCP shows pancreatic/biliary tract dilation. EUS 2010 confirmed dilation but no chronic pancreaitis or mass. Vascular ectasia crimpoing distal CBD.   .Marland KitchenAnxiety   . 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 Duodenal ulcer 2010   nsaids  . History of blood transfusion 1992   "  related to colon OR"  . Peristomal hernia   . Prostate cancer ([place]) 2018  . SBO (small bowel obstruction) ([place]) 11/10/2018  . Small bowel obstruction ([place]) 10/2017; 11/21/2017; 02/16/2018  . Spigelian hernia    bilateral    SOCIAL HX:  Social History   Tobacco Use  . Smoking status: Former Smoker    Packs/day: 0.75    Years: 32.00    Pack years: 24.00    Types: Cigarettes    Last attempt to quit: 12/21/2009    Years since quitting: 9.2  . Smokeless tobacco: Never Used  Substance Use Topics  . Alcohol use: No    Alcohol/week: 0.0 standard drinks    Comment: Former drinker    ALLERGIES:  Allergies  Allergen Reactions  . Penicillins Hives    Has patient had a PCN reaction causing immediate  rash, facial/tongue/throat swelling, SOB or lightheadedness with hypotension: Yes Has patient had a PCN reaction causing severe rash involving mucus membranes or skin necrosis: No Has patient had a PCN reaction that required hospitalization: No Has patient had a PCN reaction occurring within the last 10 years: No If all of the above answers are "NO", then may proceed with Cephalosporin use.      PERTINENT MEDICATIONS:  Outpatient Encounter Medications as of 03/24/2019  Medication Sig  . Adalimumab (HUMIRA PEN) 40 MG/0.8ML PNKT Inject 1 Syringe into the skin once a week. (Patient taking differently: Inject 40 mg into the skin every Wednesday. )  . budesonide (ENTOCORT EC) 3 MG 24 hr capsule TAKE 3 CAPSULES BY MOUTH DAILY. (Patient taking differently: Take 9 mg by mouth daily. )  . Calcium Carb-Cholecalciferol (CALCIUM 600-D PO) Take 2 tablets by mouth daily.  [name] doxycycline (VIBRAMYCIN) 100 MG capsule Take 1 capsule (100 mg total) by mouth 2 (two) times daily. One po bid x 7 days (Patient not taking: Reported on 03/17/2019)  . ferrous sulfate 325 (65 FE) MG tablet Take 325 mg by mouth daily with breakfast.  . lidocaine-prilocaine (EMLA) cream Apply 1 application topically daily as needed (when accessing port).   . mirtazapine (REMERON) 15 MG tablet TAKE 1 TABLET BY MOUTH AT BEDTIME  . Multiple Vitamin (MULTIVITAMIN WITH MINERALS) TABS tablet Take 1 tablet by mouth daily. Centrum  . oxyCODONE-acetaminophen (PERCOCET/ROXICET) 5-325 MG tablet Take 1 tablet by mouth every 4 (four) hours as needed for moderate pain.  [name] SIMETHICONE PO Take 1-2 tablets by mouth daily as needed (gas/flatulence).  [name] sulfamethoxazole-trimethoprim (BACTRIM DS,SEPTRA DS) 800-160 MG tablet Take 1 tablet by mouth 2 (two) times daily. (Patient not taking: Reported on 03/17/2019)  . XTANDI 40 MG capsule TAKE 4 CAPSULES (160 MG TOTAL) BY MOUTH DAILY. (Patient taking differently: Take 160 mg by mouth daily. )   Facility-Administered  Encounter Medications as of 03/24/2019  Medication  . sodium chloride flush (NS) 0.9 % injection 10 mL     Delante Karapetyan Jenetta Downer, NP

## 2019-03-24 NOTE — Patient Outreach (Signed)
[facility]) Care Management  03/24/2019  Daniel Howard April 03, 1956 [id number]   Telephone Screen  Referral Date: 03/24/2019 Referral Source: [facility]. Referral Reason: " 10 ED visits within past 6 months, wife states he is under palliative care, called palliative care prior to recent ED but never heard back from nurse" Insurance: [facility] Medicare   Outreach attempt # 1 to patient. Spoke with spouse-DPR on file. Souse voices that patient is resting at present. Spouse guarded and not wanting to discuss patient case at length. Discussed referral source and reason with spouse. She voices that patient is active with Palliative care. She spoke with staff today and has been given some recommendations to help manage patient's symptoms. She voices that she understands instructions and will follow them. She is aware to call palliative care for any issues or concerns. Spouse voices that she did call palliative care the day she took patient to the ED but did not hear back. She states she did not follow up with agency and does not feel the need at this time.  Spouse voices that patient is getting all the care he needs and does not need further services at this time.     Plan: RN CM will close case at this time.   Enzo Montgomery, RN,BSN,CCM [facility] Management Telephonic Care Management Coordinator Direct Phone: [phone] Toll Free: [phone] Fax: [phone]

## 2019-03-25 ENCOUNTER — Other Ambulatory Visit: Payer: Self-pay

## 2019-03-25 ENCOUNTER — Emergency Department (HOSPITAL_COMMUNITY): Payer: Medicare Other

## 2019-03-25 ENCOUNTER — Inpatient Hospital Stay (HOSPITAL_COMMUNITY)
Admission: EM | Admit: 2019-03-25 | Discharge: 2019-03-28 | DRG: 386 | Disposition: A | Discharge status: Home / Self Care | Payer: Medicare Other | Attending: Family Medicine | Admitting: Family Medicine

## 2019-03-25 DIAGNOSIS — Z8042 Family history of malignant neoplasm of prostate: Secondary | ICD-10-CM

## 2019-03-25 DIAGNOSIS — Z0189 Encounter for other specified special examinations: Secondary | ICD-10-CM

## 2019-03-25 DIAGNOSIS — Z87891 Personal history of nicotine dependence: Secondary | ICD-10-CM

## 2019-03-25 DIAGNOSIS — G4700 Insomnia, unspecified: Secondary | ICD-10-CM | POA: Diagnosis present

## 2019-03-25 DIAGNOSIS — K56609 Unspecified intestinal obstruction, unspecified as to partial versus complete obstruction: Secondary | ICD-10-CM | POA: Diagnosis not present

## 2019-03-25 DIAGNOSIS — C7951 Secondary malignant neoplasm of bone: Secondary | ICD-10-CM | POA: Diagnosis not present

## 2019-03-25 DIAGNOSIS — G8929 Other chronic pain: Secondary | ICD-10-CM | POA: Diagnosis not present

## 2019-03-25 DIAGNOSIS — K435 Parastomal hernia without obstruction or  gangrene: Secondary | ICD-10-CM | POA: Diagnosis present

## 2019-03-25 DIAGNOSIS — Z79899 Other long term (current) drug therapy: Secondary | ICD-10-CM

## 2019-03-25 DIAGNOSIS — Z932 Ileostomy status: Secondary | ICD-10-CM | POA: Diagnosis not present

## 2019-03-25 DIAGNOSIS — Z880 Allergy status to penicillin: Secondary | ICD-10-CM | POA: Diagnosis not present

## 2019-03-25 DIAGNOSIS — R109 Unspecified abdominal pain: Secondary | ICD-10-CM | POA: Diagnosis not present

## 2019-03-25 DIAGNOSIS — K50812 Crohn's disease of both small and large intestine with intestinal obstruction: Secondary | ICD-10-CM | POA: Diagnosis not present

## 2019-03-25 DIAGNOSIS — Z515 Encounter for palliative care: Secondary | ICD-10-CM | POA: Diagnosis present

## 2019-03-25 DIAGNOSIS — Z9221 Personal history of antineoplastic chemotherapy: Secondary | ICD-10-CM

## 2019-03-25 DIAGNOSIS — Z923 Personal history of irradiation: Secondary | ICD-10-CM

## 2019-03-25 DIAGNOSIS — Z800 Family history of malignant neoplasm of digestive organs: Secondary | ICD-10-CM

## 2019-03-25 DIAGNOSIS — Z7951 Long term (current) use of inhaled steroids: Secondary | ICD-10-CM

## 2019-03-25 DIAGNOSIS — E440 Moderate protein-calorie malnutrition: Secondary | ICD-10-CM | POA: Diagnosis not present

## 2019-03-25 DIAGNOSIS — Z66 Do not resuscitate: Secondary | ICD-10-CM | POA: Diagnosis not present

## 2019-03-25 DIAGNOSIS — R110 Nausea: Secondary | ICD-10-CM | POA: Diagnosis not present

## 2019-03-25 DIAGNOSIS — Z803 Family history of malignant neoplasm of breast: Secondary | ICD-10-CM | POA: Diagnosis not present

## 2019-03-25 DIAGNOSIS — Z9049 Acquired absence of other specified parts of digestive tract: Secondary | ICD-10-CM | POA: Diagnosis not present

## 2019-03-25 DIAGNOSIS — C61 Malignant neoplasm of prostate: Secondary | ICD-10-CM | POA: Diagnosis present

## 2019-03-25 LAB — CBC
HCT: 37.8 % — ABNORMAL LOW (ref 39.0–52.0)
Hemoglobin: 11.9 g/dL — ABNORMAL LOW (ref 13.0–17.0)
MCH: 30 pg (ref 26.0–34.0)
MCHC: 31.5 g/dL (ref 30.0–36.0)
MCV: 95.2 fL (ref 80.0–100.0)
Platelets: 450 10*3/uL — ABNORMAL HIGH (ref 150–400)
RBC: 3.97 MIL/uL — ABNORMAL LOW (ref 4.22–5.81)
RDW: 13.2 % (ref 11.5–15.5)
WBC: 10.7 10*3/uL — ABNORMAL HIGH (ref 4.0–10.5)
nRBC: 0 % (ref 0.0–0.2)

## 2019-03-25 LAB — URINALYSIS, ROUTINE W REFLEX MICROSCOPIC
Bilirubin Urine: NEGATIVE
Glucose, UA: NEGATIVE mg/dL
Hgb urine dipstick: NEGATIVE
Ketones, ur: 80 mg/dL — AB
Leukocytes,Ua: NEGATIVE
Nitrite: NEGATIVE
Protein, ur: NEGATIVE mg/dL
Specific Gravity, Urine: 1.028 (ref 1.005–1.030)
pH: 5 (ref 5.0–8.0)

## 2019-03-25 LAB — COMPREHENSIVE METABOLIC PANEL
ALT: 13 U/L (ref 0–44)
AST: 22 U/L (ref 15–41)
Albumin: 3.3 g/dL — ABNORMAL LOW (ref 3.5–5.0)
Alkaline Phosphatase: 83 U/L (ref 38–126)
Anion gap: 13 (ref 5–15)
BUN: 7 mg/dL — ABNORMAL LOW (ref 8–23)
CO2: 22 mmol/L (ref 22–32)
Calcium: 8.2 mg/dL — ABNORMAL LOW (ref 8.9–10.3)
Chloride: 100 mmol/L (ref 98–111)
Creatinine, Ser: 1.07 mg/dL (ref 0.61–1.24)
GFR calc Af Amer: >60 mL/min (ref >60)
GFR calc non Af Amer: >60 mL/min (ref >60)
Glucose, Bld: 84 mg/dL (ref 70–99)
Potassium: 4 mmol/L (ref 3.5–5.1)
Sodium: 135 mmol/L (ref 135–145)
Total Bilirubin: 0.8 mg/dL (ref 0.3–1.2)
Total Protein: 8.1 g/dL (ref 6.5–8.1)

## 2019-03-25 LAB — LIPASE, BLOOD: Lipase: 20 U/L (ref 11–51)

## 2019-03-25 MED ORDER — MORPHINE SULFATE (PF) 4 MG/ML IV SOLN
4.0000 mg | Freq: Once | INTRAVENOUS | Status: AC
  Administered 2019-03-25: 21:00:00 4 mg via INTRAVENOUS
  Filled 2019-03-25: qty 1

## 2019-03-25 MED ORDER — SODIUM CHLORIDE 0.9 % IV BOLUS
1000.0000 mL | Freq: Once | INTRAVENOUS | Status: AC
  Administered 2019-03-25: 21:00:00 1000 mL via INTRAVENOUS

## 2019-03-25 MED ORDER — IOHEXOL 300 MG/ML  SOLN
100.0000 mL | Freq: Once | INTRAMUSCULAR | Status: AC | PRN
  Administered 2019-03-25: 23:00:00 100 mL via INTRAVENOUS

## 2019-03-25 MED ORDER — ONDANSETRON HCL 4 MG/2ML IJ SOLN
4.0000 mg | Freq: Once | INTRAMUSCULAR | Status: AC
  Administered 2019-03-25: 21:00:00 4 mg via INTRAVENOUS
  Filled 2019-03-25: qty 2

## 2019-03-25 MED ORDER — SODIUM CHLORIDE 0.9% FLUSH
3.0000 mL | Freq: Once | INTRAVENOUS | Status: AC
  Administered 2019-03-25: 3 mL via INTRAVENOUS

## 2019-03-25 NOTE — ED Notes (Signed)
Patient transported to CT 

## 2019-03-25 NOTE — ED Provider Notes (Signed)
[facility] DEPARTMENT Provider Note   CSN: [id number] Arrival date & time: 03/25/19  [place]    History   Chief Complaint Chief Complaint  Patient presents with   Abdominal Pain    HPI Daniel Howard is a 63 y.o. male with history of Crohn's disease, prostate cancer, ileostomy, multiple abdominal surgeries, multiple small bowel obstructions, chronic left abdominal wall hernia presenting to emergency department today with chief complaint of abdominal pain 3 days.  Patient states the and is located in right lower quadrant near ileostomy site.  He describes the pain as cramping and dull.  The pain does not radiate.  He has taken oxycodone without relief.  He has tried to decrease p.o. intake over the last 3 days in order to help his pain however he states this did not help.  Patient's last intake was a chicken broth this morning.  Patient reports associated nausea without vomiting.  Patient states the ostomy output is thicker than usual, he thinks he is constipated.  Pt denies fever, shortness of breath, chest pain. History provided by pt.    Past Medical History:  Diagnosis Date   Abnormal finding of biliary tract    MRCP shows pancreatic/biliary tract dilation. EUS 2010 confirmed dilation but no chronic pancreaitis or mass. Vascular ectasia crimpoing distal CBD.    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.s/p transverse loop colostomy secondary to stricture 1992., followed by end-transverse ostomy, followed by right hemicolectomy, followed  by takedown & ileostomy   Duodenal ulcer 2010   nsaids   History of blood transfusion 1992   "related to colon OR"   Peristomal hernia    Prostate cancer ([place]) 2018   SBO (small bowel obstruction) ([place]) 11/10/2018    Small bowel obstruction ([place]) 10/2017; 11/21/2017; 02/16/2018   Spigelian hernia    bilateral    Patient Active Problem List   Diagnosis Date Noted   Incarcerated hernia 01/09/2019   Palliative care encounter    Small bowel obstruction due to adhesions ([place]) 12/29/2018   Small bowel obstruction ([place]) 12/03/2018   Partial small bowel obstruction ([place]) 11/01/2018   SBO (small bowel obstruction) ([place]) 09/30/2018   Iron deficiency anemia 07/03/2018   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   Nausea without vomiting 11/10/2017   Incisional hernia 09/22/2017   Abdominal pain of multiple sites 09/22/2017   Goals of care, counseling/discussion 04/02/2017   Prostate cancer metastatic to bone ([place]) 04/02/2017   Elevated PSA 03/04/2017   Osteopenia 11/03/2016   Pelvic mass in male 09/04/2016   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   Exacerbation of Crohn's disease ([place]) 09/04/2016   Family history of colon cancer 10/12/2014   Protein-calorie malnutrition, severe ([place]) 07/27/2014   Loss of weight 06/09/2014   Crohn's disease of both small and large intestine with complication ([place]) 71/69/6789   Boils 02/11/2013   Ventral hernia 12/11/2009   Anemia 12/03/2009   Regional enteritis/Crohn's 01/25/2007    Past Surgical History:  Procedure Laterality Date   APPENDECTOMY  1992   at time of exp laparotomy at which time he was noted to have fistulizing Crohn's rather than UC   COLON SURGERY     COLONOSCOPY [name] 08/23/2014   FYB:OFBPZWC proctoscopy with possible fistulous opening in thebase of rectal/anal stump.     COLOSTOMY  1992  transverse loop colostomy secondary to a stricture   ESOPHAGOGASTRODUODENOSCOPY  05/2009   SLF: multiple antral erosions, large ulcer at ansatomosis (postsurgical changes at duodenal bulb and second portion of duodenum) BX c/x NSAIDS.   EUS  10/04/2009   Dr. Estill Bakes dilated CBD and main pancreatic duct.  No pancreatic   EXPLORATORY  LAPAROTOMY  1992   FLEXIBLE SIGMOIDOSCOPY  1988   Dr. Laural Golden- suggested rohn's disease but the biopsies were not collaborative.   FLEXIBLE SIGMOIDOSCOPY [name] 09/08/2016   Procedure: FLEXIBLE SIGMOIDOSCOPY;  Surgeon: Wonda Horner, MD;  Location: [facility] ENDOSCOPY;  Service: Gastroenterology;  Laterality: [name];   [place]   incarcerated periostial hernia with additional surgery in [place] RIGHT  04/03/2017   IR US GUIDE VASC ACCESS RIGHT  04/03/2017        Home Medications    Prior to Admission medications   Medication Sig Start Date End Date Taking? Authorizing Provider  Adalimumab (HUMIRA PEN) 40 MG/0.8ML PNKT Inject 1 Syringe into the skin once a week. Patient taking differently: Inject 40 mg into the skin every Wednesday.  09/01/18  Yes Annitta Needs, NP  albuterol (PROVENTIL HFA) 108 (90 Base) MCG/ACT inhaler Inhale 2 puffs into the lungs every 6 (six) hours as needed for wheezing or shortness of breath.   Yes [provider]  budesonide (ENTOCORT EC) 3 MG 24 hr capsule TAKE 3 CAPSULES BY MOUTH DAILY. Patient taking differently: Take 9 mg by mouth daily.  02/07/19  Yes Carlis Stable, NP  Calcium Carb-Cholecalciferol (CALCIUM 600-D PO) Take 2 tablets by mouth daily.   Yes [provider]  ferrous sulfate 325 (65 FE) MG tablet Take 325 mg by mouth daily with breakfast.   Yes [provider]  lidocaine-prilocaine (EMLA) cream Apply 1 application topically daily as needed (when accessing port).    Yes [provider]  mirtazapine (REMERON) 15 MG tablet TAKE 1 TABLET BY MOUTH AT BEDTIME Patient taking differently: Take 15 mg by mouth at bedtime.  03/21/19  Yes Pisek, [name], MD  Multiple Vitamins-Minerals (CENTRUM SILVER 50+MEN) TABS Take 1 tablet by mouth daily with breakfast.   Yes [provider]  oxyCODONE-acetaminophen (PERCOCET/ROXICET) 5-325 MG  tablet Take 1 tablet by mouth every 4 (four) hours as needed for moderate pain. 03/23/19  Yes [place], [name], MD  SIMETHICONE PO Take 1-2 tablets by mouth as needed (gas/flatulence).    Yes [provider]  XTANDI 40 MG capsule TAKE 4 CAPSULES (160 MG TOTAL) BY MOUTH DAILY. Patient taking differently: Take 160 mg by mouth daily.  03/11/19  Yes [name], MD  doxycycline (VIBRAMYCIN) 100 MG capsule Take 1 capsule (100 mg total) by mouth 2 (two) times daily. One po bid x 7 days Patient not taking: Reported on 03/25/2019 02/21/19   Malvin Johns, MD  sulfamethoxazole-trimethoprim (BACTRIM DS,SEPTRA DS) 800-160 MG tablet Take 1 tablet by mouth 2 (two) times daily. Patient not taking: Reported on 03/25/2019 02/04/19   Alycia Rossetti, MD    Family History Family History  Problem Relation Age of Onset   Cancer Father        prostate    Prostate cancer Father    Colon cancer Father 66   Hypertension Sister    Cancer Sister    Depression Sister    Breast cancer Sister    COPD Sister  Aneurysm Brother        deceased, brain aneurysm    Social History Social History   Tobacco Use   Smoking status: Former Smoker    Packs/day: 0.75    Years: 32.00    Pack years: 24.00    Types: Cigarettes    Last attempt to quit: 12/21/2009    Years since quitting: 9.2   Smokeless tobacco: Never Used  Substance Use Topics   Alcohol use: No    Alcohol/week: 0.0 standard drinks    Comment: Former drinker   Drug use: No     Allergies   Penicillins   Review of Systems Review of Systems  Constitutional: Negative for chills and fever.  HENT: Negative for congestion, rhinorrhea, sinus pressure and sore throat.   Eyes: Negative for pain and redness.  Respiratory: Negative for cough, shortness of breath and wheezing.   Cardiovascular: Negative for chest pain and palpitations.  Gastrointestinal: Positive for abdominal pain, constipation and nausea. Negative for diarrhea  and vomiting.  Genitourinary: Negative for dysuria.  Musculoskeletal: Negative for arthralgias, back pain, myalgias and neck pain.  Skin: Negative for rash and wound.  Neurological: Negative for dizziness, syncope, weakness, numbness and headaches.  Psychiatric/Behavioral: Negative for confusion.     Physical Exam Updated Vital Signs BP 129/81    Pulse 83    Temp 98.5 F (36.9 C) (Oral)    Resp 18    Ht 5' 4.5" (1.638 m)    Wt 56.2 kg    SpO2 99%    BMI 20.96 kg/m   Physical Exam Vitals signs and nursing note reviewed.  Constitutional:      Appearance: He is well-developed. He is not toxic-appearing.     Comments: Patient appears uncomfortable secondary to pain.  He is in no acute distress.  HENT:     Head: Normocephalic and atraumatic.     Nose: Nose normal.     Mouth/Throat:     Mouth: Mucous membranes are dry.  Eyes:     General: No scleral icterus.       Right eye: No discharge.        Left eye: No discharge.     Conjunctiva/sclera: Conjunctivae normal.  Neck:     Musculoskeletal: Normal range of motion.  Cardiovascular:     Rate and Rhythm: Normal rate and regular rhythm.     Pulses: Normal pulses.     Heart sounds: Normal heart sounds.  Pulmonary:     Effort: Pulmonary effort is normal.     Breath sounds: Normal breath sounds.  Abdominal:     General: Bowel sounds are normal. There is no distension.     Palpations: Abdomen is soft.     Tenderness: There is abdominal tenderness. There is no right CVA tenderness, left CVA tenderness, guarding or rebound.     Comments: Ostomy pink, patent, functioning with stool coming out of ostomy into bag.  Stool appears thicker than normal for him.  Patient also has a large soft left abdominal wall hernia.   Musculoskeletal: Normal range of motion.  Skin:    General: Skin is warm and dry.  Neurological:     Mental Status: He is oriented to person, place, and time.     Comments: Fluent speech, no facial droop.  Psychiatric:         Behavior: Behavior normal.      ED Treatments / Results  Labs (all labs ordered are listed, but only abnormal results are displayed) Labs  Reviewed  COMPREHENSIVE METABOLIC PANEL - Abnormal; Notable for the following components:      Result Value   BUN 7 (*)    Calcium 8.2 (*)    Albumin 3.3 (*)    All other components within normal limits  CBC - Abnormal; Notable for the following components:   WBC 10.7 (*)    RBC 3.97 (*)    Hemoglobin 11.9 (*)    HCT 37.8 (*)    Platelets 450 (*)    All other components within normal limits  URINALYSIS, ROUTINE W REFLEX MICROSCOPIC - Abnormal; Notable for the following components:   Ketones, ur 80 (*)    All other components within normal limits  LIPASE, BLOOD    EKG None  Radiology Ct Abdomen Pelvis W Contrast  Result Date: 03/25/2019 CLINICAL DATA:  Generalized abdominal pain and constipation. EXAM: CT ABDOMEN AND PELVIS WITH CONTRAST TECHNIQUE: Multidetector CT imaging of the abdomen and pelvis was performed using the standard protocol following bolus administration of intravenous contrast. CONTRAST:  140m OMNIPAQUE IOHEXOL 300 MG/ML  SOLN COMPARISON:  CT scans from December 02, 2018, December 29, 2018, and January 08, 2019. FINDINGS: Lower chest: No acute abnormality. Hepatobiliary: Gallbladder distention remains, similar since December 29, 2018. The liver is normal. Portal vein is normal. There is prominence of the intra and extrahepatic biliary tree which is stable. Pancreas: Pancreatic duct dilatation remains measuring up to 5 mm, unchanged. The remainder of the pancreas is normal. Spleen: An enhancing nodule in the anterior spleen is stable and of doubtful significance. The spleen is otherwise normal. Adrenals/Urinary Tract: Adrenal glands are normal. No perinephric stranding, stones, or masses. No acute hydronephrosis identified. Probable stones in the left side of the bladder, unchanged. The bladder is otherwise normal. No ureteral  stones are noted. Stomach/Bowel: The bowel pattern is complicated. There are both dilated and nondilated loops of bowel in the abdomen. A dilated loop of small bowel in the right upper quadrant measures up to 6.3 cm today versus 5.5 cm December 02, 2018 6.5 cm December 29, 2018 and 6.5 cm January 08, 2019. A loop of bowel in the right side of the pelvis measures 5.7 cm today versus 4.3 cm previously. There is some fecal material within this loop of bowel which is just proximal to a right lower quadrant ostomy. A dilated loop of small bowel deep in the pelvis on series 3, image 65 is similar since December 29, 2018. Multiple nondilated loops of small bowel are seen in the left side of the abdomen. The patient has a left-sided ostomy. A peristomal hernia is identified. There is a loop of descending colon within the hernia which is fluid-filled and mildly prominent measuring 3.1 cm today versus 4 cm December 29, 2018. The loops of small bowel within the left-sided parastomal hernia are decompressed. There is soft tissue adjacent to the rectum which is similar since previous studies. This soft tissue appears to abut the prostate in the prostate appears to be deviated to the right, also a stable finding. There is mucosal enhancement associated with right abdominal loops of small bowel. Vascular/Lymphatic: The aorta and branching vessels are normal. Prominent and enlarged left periaortic nodes are stable. Reproductive: The prostate appears to be deviated to the right and abuts increased soft tissue in the right side of the pelvis which has been stable when compared to previous studies. Other: No abdominal wall hernia or abnormality. No abdominopelvic ascites. Musculoskeletal: Multiple sclerotic lesions in the bones, particularly the pelvis,  are stable, consistent with history of prostate cancer. The right proximal femur is involved. IMPRESSION: 1. The pattern of bowel is complicated. Multiple dilated and nondilated loops of  small bowel are identified throughout the abdomen and pelvis. The overall pattern is similar since December 02, 2018 but there has been some worsening in small bowel dilatation since that time. The right pelvic loops, just prior to the right lower quadrant ostomy are mildly more prominent/dilated in the interval. The findings are consistent with a small-bowel obstruction, likely partial but significant. I suspect multiple transition points. Overall, the amount of dilatation is mildly worsened since December of 2019 but the overall pattern of dilatation is similar since that time. The dilated loop of colon in the left lower quadrant parastomal hernia remains fluid-filled but is less dilated in the interval. 2. There is mucosal enhancement associated with loops of small bowel in the right side of the abdomen, likely representing active Crohn's disease. 3. Persistent gallbladder distention. Persistent prominence of the intra and extrahepatic bile ducts. The findings are similar in the interval. Recommend correlation with labs. 4. Persistent pancreatic duct dilatation. 5. No bony metastatic disease. 6. There appears to be abnormal soft tissue in the right side of the pelvis, abutting the right side of the prostate which is deviated to the right. This finding is stable. Electronically Signed   By: Dorise Bullion III M.D   On: 03/25/2019 23:25    Procedures Procedures (including critical care time)  Medications Ordered in ED Medications  sodium chloride flush (NS) 0.9 % injection 3 mL (3 mLs Intravenous Given 03/25/19 2113)  sodium chloride 0.9 % bolus 1,000 mL (0 mLs Intravenous Stopped 03/25/19 2305)  morphine 4 MG/ML injection 4 mg (4 mg Intravenous Given 03/25/19 2112)  ondansetron (ZOFRAN) injection 4 mg (4 mg Intravenous Given 03/25/19 2112)  iohexol (OMNIPAQUE) 300 MG/ML solution 100 mL (100 mLs Intravenous Contrast Given 03/25/19 2245)     Initial Impression / Assessment and Plan / ED Course  I have  reviewed the triage vital signs and the nursing notes.  Pertinent labs & imaging results that were available during my care of the patient were reviewed by me and considered in my medical decision making (see chart for details).     Patient has extensive abdominal surgical history, also has Crohn's disease.  I viewed EMR and prior charts for additional history.  Will initiate work-up with CBC, CMP, lipase, UA, CT abdomen pelvis.  Will give IV fluids, Zofran for nausea and morphine for pain, will reassess.  Ct abdomen shows small-bowel obstruction, partial but significant with possibly multiple transition points. CBC shows leukocytosis of 10.7, CMP overall unremarkable. Lipase within normal limits. UA with ketones suggesting dehydration, no signs of urinary tract infection.  On reexamination pt reports abdominal pain improved with morphine and is still tender to right upper and lower quadrants. PT reports nausea has resolved. Pt case discussed with Dr. Tyrone Nine who agrees with my plan.   Spoke with Dr. Barry Dienes with general surgery to consult for SBO, surgery will follow. Also spoke with Dr. Ouida Sills with hospitalist service who agrees to assume care of patient and bring into the hospital for further evaluation and management.     This note was prepared with assistance of Systems analyst. Occasional wrong-word or sound-a-like substitutions may have occurred due to the inherent limitations of voice recognition software.   Final Clinical Impressions(s) / ED Diagnoses   Final diagnoses:  Small bowel obstruction ([place])  ED Discharge Orders    None       Flint Melter 03/26/19 Frances Maywood, DO 03/26/19 [phone]

## 2019-03-25 NOTE — ED Triage Notes (Signed)
Pt arrives with c/o of generalized abd pain with constipation. Denies any n/v.

## 2019-03-26 ENCOUNTER — Inpatient Hospital Stay (HOSPITAL_COMMUNITY): Payer: Medicare Other

## 2019-03-26 ENCOUNTER — Encounter (HOSPITAL_COMMUNITY): Payer: Self-pay

## 2019-03-26 DIAGNOSIS — K50912 Crohn's disease, unspecified, with intestinal obstruction: Secondary | ICD-10-CM | POA: Diagnosis not present

## 2019-03-26 DIAGNOSIS — K651 Peritoneal abscess: Secondary | ICD-10-CM | POA: Diagnosis not present

## 2019-03-26 DIAGNOSIS — Z800 Family history of malignant neoplasm of digestive organs: Secondary | ICD-10-CM | POA: Diagnosis not present

## 2019-03-26 DIAGNOSIS — C61 Malignant neoplasm of prostate: Secondary | ICD-10-CM | POA: Diagnosis present

## 2019-03-26 DIAGNOSIS — K56609 Unspecified intestinal obstruction, unspecified as to partial versus complete obstruction: Secondary | ICD-10-CM | POA: Diagnosis not present

## 2019-03-26 DIAGNOSIS — Z9049 Acquired absence of other specified parts of digestive tract: Secondary | ICD-10-CM | POA: Diagnosis not present

## 2019-03-26 DIAGNOSIS — Z79899 Other long term (current) drug therapy: Secondary | ICD-10-CM | POA: Diagnosis not present

## 2019-03-26 DIAGNOSIS — R109 Unspecified abdominal pain: Secondary | ICD-10-CM | POA: Diagnosis not present

## 2019-03-26 DIAGNOSIS — G4700 Insomnia, unspecified: Secondary | ICD-10-CM | POA: Diagnosis present

## 2019-03-26 DIAGNOSIS — C7951 Secondary malignant neoplasm of bone: Secondary | ICD-10-CM | POA: Diagnosis present

## 2019-03-26 DIAGNOSIS — E440 Moderate protein-calorie malnutrition: Secondary | ICD-10-CM | POA: Diagnosis present

## 2019-03-26 DIAGNOSIS — K435 Parastomal hernia without obstruction or  gangrene: Secondary | ICD-10-CM | POA: Diagnosis present

## 2019-03-26 DIAGNOSIS — Z880 Allergy status to penicillin: Secondary | ICD-10-CM | POA: Diagnosis not present

## 2019-03-26 DIAGNOSIS — Z87891 Personal history of nicotine dependence: Secondary | ICD-10-CM | POA: Diagnosis not present

## 2019-03-26 DIAGNOSIS — K50812 Crohn's disease of both small and large intestine with intestinal obstruction: Secondary | ICD-10-CM | POA: Diagnosis present

## 2019-03-26 DIAGNOSIS — G8929 Other chronic pain: Secondary | ICD-10-CM | POA: Diagnosis present

## 2019-03-26 DIAGNOSIS — B9789 Other viral agents as the cause of diseases classified elsewhere: Secondary | ICD-10-CM | POA: Diagnosis not present

## 2019-03-26 DIAGNOSIS — Z803 Family history of malignant neoplasm of breast: Secondary | ICD-10-CM | POA: Diagnosis not present

## 2019-03-26 DIAGNOSIS — Z66 Do not resuscitate: Secondary | ICD-10-CM | POA: Diagnosis present

## 2019-03-26 DIAGNOSIS — Z9221 Personal history of antineoplastic chemotherapy: Secondary | ICD-10-CM | POA: Diagnosis not present

## 2019-03-26 DIAGNOSIS — Z8042 Family history of malignant neoplasm of prostate: Secondary | ICD-10-CM | POA: Diagnosis not present

## 2019-03-26 DIAGNOSIS — J069 Acute upper respiratory infection, unspecified: Secondary | ICD-10-CM | POA: Diagnosis not present

## 2019-03-26 DIAGNOSIS — Z923 Personal history of irradiation: Secondary | ICD-10-CM | POA: Diagnosis not present

## 2019-03-26 DIAGNOSIS — Z515 Encounter for palliative care: Secondary | ICD-10-CM | POA: Diagnosis present

## 2019-03-26 DIAGNOSIS — Z7951 Long term (current) use of inhaled steroids: Secondary | ICD-10-CM | POA: Diagnosis not present

## 2019-03-26 DIAGNOSIS — Z932 Ileostomy status: Secondary | ICD-10-CM | POA: Diagnosis not present

## 2019-03-26 LAB — CBC
HCT: 33.5 % — ABNORMAL LOW (ref 39.0–52.0)
Hemoglobin: 10.7 g/dL — ABNORMAL LOW (ref 13.0–17.0)
MCH: 30.1 pg (ref 26.0–34.0)
MCHC: 31.9 g/dL (ref 30.0–36.0)
MCV: 94.1 fL (ref 80.0–100.0)
Platelets: 383 10*3/uL (ref 150–400)
RBC: 3.56 MIL/uL — ABNORMAL LOW (ref 4.22–5.81)
RDW: 13.2 % (ref 11.5–15.5)
WBC: 6.7 10*3/uL (ref 4.0–10.5)
nRBC: 0 % (ref 0.0–0.2)

## 2019-03-26 LAB — BASIC METABOLIC PANEL
Anion gap: 8 (ref 5–15)
BUN: 6 mg/dL — ABNORMAL LOW (ref 8–23)
CO2: 22 mmol/L (ref 22–32)
Calcium: 7.3 mg/dL — ABNORMAL LOW (ref 8.9–10.3)
Chloride: 104 mmol/L (ref 98–111)
Creatinine, Ser: 1.02 mg/dL (ref 0.61–1.24)
GFR calc Af Amer: >60 mL/min (ref >60)
GFR calc non Af Amer: >60 mL/min (ref >60)
Glucose, Bld: 74 mg/dL (ref 70–99)
Potassium: 3.8 mmol/L (ref 3.5–5.1)
Sodium: 134 mmol/L — ABNORMAL LOW (ref 135–145)

## 2019-03-26 LAB — HIV ANTIBODY (ROUTINE TESTING W REFLEX): HIV Screen 4th Generation wRfx: NONREACTIVE

## 2019-03-26 MED ORDER — MELATONIN 3 MG PO TABS
3.0000 mg | ORAL_TABLET | Freq: Every evening | ORAL | Status: DC | PRN
  Administered 2019-03-27: 3 mg via ORAL
  Filled 2019-03-26 (×2): qty 1

## 2019-03-26 MED ORDER — ACETAMINOPHEN 650 MG RE SUPP: 650.0000 mg | Freq: Four times a day (QID) | RECTAL | Status: DC | PRN

## 2019-03-26 MED ORDER — ACETAMINOPHEN 325 MG PO TABS: 650.0000 mg | ORAL_TABLET | Freq: Four times a day (QID) | ORAL | Status: DC | PRN

## 2019-03-26 MED ORDER — BUDESONIDE 3 MG PO CPEP
9.0000 mg | ORAL_CAPSULE | Freq: Every day | ORAL | Status: DC
  Administered 2019-03-27 – 2019-03-28 (×2): 9 mg via ORAL
  Filled 2019-03-26 (×3): qty 3

## 2019-03-26 MED ORDER — NON FORMULARY: 3.0000 mg | Freq: Every evening | Status: DC | PRN

## 2019-03-26 MED ORDER — ONDANSETRON HCL 4 MG/2ML IJ SOLN
4.0000 mg | Freq: Four times a day (QID) | INTRAMUSCULAR | Status: DC | PRN
  Administered 2019-03-26 – 2019-03-27 (×2): 4 mg via INTRAVENOUS
  Filled 2019-03-26 (×2): qty 2

## 2019-03-26 MED ORDER — ENZALUTAMIDE 40 MG PO CAPS
160.0000 mg | ORAL_CAPSULE | Freq: Every day | ORAL | Status: DC
  Administered 2019-03-27 – 2019-03-28 (×2): 160 mg via ORAL
  Filled 2019-03-26 (×3): qty 4

## 2019-03-26 MED ORDER — DIATRIZOATE MEGLUMINE & SODIUM 66-10 % PO SOLN
90.0000 mL | Freq: Once | ORAL | Status: AC
  Administered 2019-03-26: 10:00:00 90 mL via NASOGASTRIC
  Filled 2019-03-26 (×2): qty 90

## 2019-03-26 MED ORDER — MIRTAZAPINE 15 MG PO TABS
15.0000 mg | ORAL_TABLET | Freq: Every day | ORAL | Status: DC
  Administered 2019-03-26 – 2019-03-27 (×2): 15 mg via ORAL
  Filled 2019-03-26 (×2): qty 1

## 2019-03-26 MED ORDER — MORPHINE SULFATE (PF) 4 MG/ML IV SOLN
4.0000 mg | INTRAVENOUS | Status: DC | PRN
  Administered 2019-03-26 (×2): 4 mg via INTRAVENOUS
  Filled 2019-03-26 (×2): qty 1

## 2019-03-26 MED ORDER — MORPHINE SULFATE (PF) 4 MG/ML IV SOLN
4.0000 mg | INTRAVENOUS | Status: DC | PRN
  Administered 2019-03-26 – 2019-03-27 (×8): 4 mg via INTRAVENOUS
  Filled 2019-03-26 (×8): qty 1

## 2019-03-26 MED ORDER — ENOXAPARIN SODIUM 40 MG/0.4ML ~~LOC~~ SOLN
40.0000 mg | SUBCUTANEOUS | Status: DC
  Administered 2019-03-26: 10:00:00 40 mg via SUBCUTANEOUS
  Filled 2019-03-26: qty 0.4

## 2019-03-26 MED ORDER — SODIUM CHLORIDE 0.9 % IV SOLN
INTRAVENOUS | Status: DC
  Administered 2019-03-26 – 2019-03-28 (×5): via INTRAVENOUS

## 2019-03-26 NOTE — Consult Note (Signed)
Reason for Consult: SBO Referring Physician: Dr. Tyrone Nine and Emeterio Reeve, PA-C  Daniel Howard is an 63 y.o. male.  HPI:  Patient is a 63 year old male with a complicated abdominal history whom I am asked to evaluate for small bowel obstruction.  He had a sudden worsening of abdominal pain.  Patient does have chronic pain secondary to Crohn's disease and metastatic prostate cancer.  He was admitted to the hospital in January for similar symptoms and was discharged to hospice.  He has improved and has been transferred to palliative care rather than hospice.  He has also been actively trying to decrease his narcotic intake as he has had issues with constipation.  This episode started approximately 3 days ago.  He switched back to just clear liquids and has not had any solid food for 3 days.  He has had minimal output in his ostomy bag for several days.  He tried having a bowel movement and which did not help the pain.  Decreasing his diet did not help the pain.  After trying to cut back on his narcotics, he did take some, and these did not relieve the pain.    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  There are several loops of small bowel that are enhancing consistent with active Crohn's disease.  Past Medical History:  Diagnosis Date  . Abnormal finding of biliary tract    MRCP shows pancreatic/biliary tract dilation. EUS 2010 confirmed dilation but no chronic pancreaitis or mass. Vascular ectasia crimpoing distal CBD.   [name] Anxiety   . Crohn's 1982   initially treated for UC first 9-10 years but at time of exploratory laparotomy with incidental appendectomy in 1992 he was noted to have multiple fistulas involving rectosigmoid colon with sigmoid stricture.s/p transverse loop colostomy secondary to stricture 1992., followed by end-transverse ostomy, followed by right hemicolectomy, followed  by  takedown & ileostomy  . Duodenal ulcer 2010   nsaids  . History of blood transfusion 1992   "related to colon OR"  . Peristomal hernia   . Prostate cancer ([place]) 2018  . SBO (small bowel obstruction) ([place]) 11/10/2018  . Small bowel obstruction ([place]) 10/2017; 11/21/2017; 02/16/2018  . Spigelian hernia    bilateral    Past Surgical History:  Procedure Laterality Date  . APPENDECTOMY  1992   at time of exp laparotomy at which time he was noted to have fistulizing Crohn's rather than UC  . COLON SURGERY    . COLONOSCOPY [name] 08/23/2014   YBO:FBPZWCH proctoscopy with possible fistulous opening in thebase of rectal/anal stump.    . COLOSTOMY  1992   transverse loop colostomy secondary to a stricture  . ESOPHAGOGASTRODUODENOSCOPY  05/2009   SLF: multiple antral erosions, large ulcer at ansatomosis (postsurgical changes at duodenal bulb and second portion of duodenum) BX c/x NSAIDS.  . EUS  10/04/2009   Dr. Estill Bakes dilated CBD and main pancreatic duct.  No pancreatic  . EXPLORATORY LAPAROTOMY  1992  . FLEXIBLE SIGMOIDOSCOPY  1988   Dr. Laural Golden- suggested rohn's disease but the biopsies were not collaborative.  [name] FLEXIBLE SIGMOIDOSCOPY [name] 09/08/2016   Procedure: FLEXIBLE SIGMOIDOSCOPY;  Surgeon: Wonda Horner, MD;  Location: [facility] ENDOSCOPY;  Service: Gastroenterology;  Laterality: [name];  . HEMICOLOECTOMY W/ ANASTOMOSIS  1993   R- Dr.DeMason   . HERNIA REPAIR  1996   incarcerated periostial hernia with additional surgery in 1999  .  IR FLUORO GUIDE PORT INSERTION RIGHT  04/03/2017  . IR US GUIDE VASC ACCESS RIGHT  04/03/2017    Family History  Problem Relation Age of Onset  . Cancer Father        prostate   . Prostate cancer Father   . Colon cancer Father 38  . Hypertension Sister   . Cancer Sister   . Depression Sister   . Breast cancer Sister   . COPD Sister   . Aneurysm Brother        deceased, brain aneurysm    Social History:  reports that he quit smoking about 9 years ago.  His smoking use included cigarettes. He has a 24.00 pack-year smoking history. He has never used smokeless tobacco. He reports that he does not drink alcohol or use drugs.  Allergies:  Allergies  Allergen Reactions  . Penicillins Hives    Has patient had a PCN reaction causing immediate rash, facial/tongue/throat swelling, SOB or lightheadedness with hypotension: Yes Has patient had a PCN reaction causing severe rash involving mucus membranes or skin necrosis: No Has patient had a PCN reaction that required hospitalization: No Has patient had a PCN reaction occurring within the last 10 years: No If all of the above answers are "NO", then may proceed with Cephalosporin use.     Medications:  Current Meds  Medication Sig  . Adalimumab (HUMIRA PEN) 40 MG/0.8ML PNKT Inject 1 Syringe into the skin once a week. (Patient taking differently: Inject 40 mg into the skin every Wednesday. )  . albuterol (PROVENTIL HFA) 108 (90 Base) MCG/ACT inhaler Inhale 2 puffs into the lungs every 6 (six) hours as needed for wheezing or shortness of breath.  . budesonide (ENTOCORT EC) 3 MG 24 hr capsule TAKE 3 CAPSULES BY MOUTH DAILY. (Patient taking differently: Take 9 mg by mouth daily. )  . Calcium Carb-Cholecalciferol (CALCIUM 600-D PO) Take 2 tablets by mouth daily.  . ferrous sulfate 325 (65 FE) MG tablet Take 325 mg by mouth daily with breakfast.  . lidocaine-prilocaine (EMLA) cream Apply 1 application topically daily as needed (when accessing port).   . mirtazapine (REMERON) 15 MG tablet TAKE 1 TABLET BY MOUTH AT BEDTIME (Patient taking differently: Take 15 mg by mouth at bedtime. )  . Multiple Vitamins-Minerals (CENTRUM SILVER 50+MEN) TABS Take 1 tablet by mouth daily with breakfast.  . oxyCODONE-acetaminophen (PERCOCET/ROXICET) 5-325 MG tablet Take 1 tablet by mouth every 4 (four) hours as needed for moderate pain.  [name] SIMETHICONE PO Take 1-2 tablets by mouth as needed (gas/flatulence).   [name] 40 MG  capsule TAKE 4 CAPSULES (160 MG TOTAL) BY MOUTH DAILY. (Patient taking differently: Take 160 mg by mouth daily. )     Results for orders placed or performed during the hospital encounter of 03/25/19 (from the past 48 hour(s))  Lipase, blood     Status: None   Collection Time: 03/25/19  6:33 PM  Result Value Ref Range   Lipase 20 11 - 51 U/L    Comment: Performed at [facility], [place] [address] [place], [place] [address]  Comprehensive metabolic panel     Status: Abnormal   Collection Time: 03/25/19  6:33 PM  Result Value Ref Range   Sodium 135 135 - 145 mmol/L   Potassium 4.0 3.5 - 5.1 mmol/L   Chloride 100 98 - 111 mmol/L   CO2 22 22 - 32 mmol/L   Glucose, Bld 84 70 - 99 mg/dL   BUN 7 (  L) 8 - 23 mg/dL   Creatinine, Ser 1.07 0.61 - 1.24 mg/dL   Calcium 8.2 (L) 8.9 - 10.3 mg/dL   Total Protein 8.1 6.5 - 8.1 g/dL   Albumin 3.3 (L) 3.5 - 5.0 g/dL   AST 22 15 - 41 U/L   ALT 13 0 - 44 U/L   Alkaline Phosphatase 83 38 - 126 U/L   Total Bilirubin 0.8 0.3 - 1.2 mg/dL   GFR calc non Af Amer >60 >60 mL/min   GFR calc Af Amer >60 >60 mL/min   Anion gap 13 5 - 15    Comment: Performed at [facility] [address], [place], [place] [address]  CBC     Status: Abnormal   Collection Time: 03/25/19  6:33 PM  Result Value Ref Range   WBC 10.7 (H) 4.0 - 10.5 K/uL   RBC 3.97 (L) 4.22 - 5.81 MIL/uL   Hemoglobin 11.9 (L) 13.0 - 17.0 g/dL   HCT 37.8 (L) 39.0 - 52.0 %   MCV 95.2 80.0 - 100.0 fL   MCH 30.0 26.0 - 34.0 pg   MCHC 31.5 30.0 - 36.0 g/dL   RDW 13.2 11.5 - 15.5 %   Platelets 450 (H) 150 - 400 K/uL   nRBC 0.0 0.0 - 0.2 %    Comment: Performed at [facility] [address] [place], [place] [address]  Urinalysis, Routine w reflex microscopic     Status: Abnormal   Collection Time: 03/25/19  7:40 PM  Result Value Ref Range   Color, Urine YELLOW YELLOW   APPearance CLEAR CLEAR   Specific Gravity, Urine 1.028 1.005 - 1.030   pH 5.0 5.0 - 8.0   Glucose, UA  NEGATIVE NEGATIVE mg/dL   Hgb urine dipstick NEGATIVE NEGATIVE   Bilirubin Urine NEGATIVE NEGATIVE   Ketones, ur 80 (A) NEGATIVE mg/dL   Protein, ur NEGATIVE NEGATIVE mg/dL   Nitrite NEGATIVE NEGATIVE   Leukocytes,Ua NEGATIVE NEGATIVE    Comment: Performed at [facility] [address] [place],  [address]    Ct Abdomen Pelvis W Contrast  Result Date: 03/25/2019 CLINICAL DATA:  Generalized abdominal pain and constipation. EXAM: CT ABDOMEN AND PELVIS WITH CONTRAST TECHNIQUE: Multidetector CT imaging of the abdomen and pelvis was performed using the standard protocol following bolus administration of intravenous contrast. CONTRAST:  170m OMNIPAQUE IOHEXOL 300 MG/ML  SOLN COMPARISON:  CT scans from December 02, 2018, December 29, 2018, and January 08, 2019. FINDINGS: Lower chest: No acute abnormality. Hepatobiliary: Gallbladder distention remains, similar since December 29, 2018. The liver is normal. Portal vein is normal. There is prominence of the intra and extrahepatic biliary tree which is stable. Pancreas: Pancreatic duct dilatation remains measuring up to 5 mm, unchanged. The remainder of the pancreas is normal. Spleen: An enhancing nodule in the anterior spleen is stable and of doubtful significance. The spleen is otherwise normal. Adrenals/Urinary Tract: Adrenal glands are normal. No perinephric stranding, stones, or masses. No acute hydronephrosis identified. Probable stones in the left side of the bladder, unchanged. The bladder is otherwise normal. No ureteral stones are noted. Stomach/Bowel: The bowel pattern is complicated. There are both dilated and nondilated loops of bowel in the abdomen. A dilated loop of small bowel in the right upper quadrant measures up to 6.3 cm today versus 5.5 cm December 02, 2018 6.5 cm December 29, 2018 and 6.5 cm January 08, 2019. A loop of bowel in the right side of the  pelvis measures 5.7 cm today versus 4.3 cm previously. There is some fecal material  within this loop of bowel which is just proximal to a right lower quadrant ostomy. A dilated loop of small bowel deep in the pelvis on series 3, image 65 is similar since December 29, 2018. Multiple nondilated loops of small bowel are seen in the left side of the abdomen. The patient has a left-sided ostomy. A peristomal hernia is identified. There is a loop of descending colon within the hernia which is fluid-filled and mildly prominent measuring 3.1 cm today versus 4 cm December 29, 2018. The loops of small bowel within the left-sided parastomal hernia are decompressed. There is soft tissue adjacent to the rectum which is similar since previous studies. This soft tissue appears to abut the prostate in the prostate appears to be deviated to the right, also a stable finding. There is mucosal enhancement associated with right abdominal loops of small bowel. Vascular/Lymphatic: The aorta and branching vessels are normal. Prominent and enlarged left periaortic nodes are stable. Reproductive: The prostate appears to be deviated to the right and abuts increased soft tissue in the right side of the pelvis which has been stable when compared to previous studies. Other: No abdominal wall hernia or abnormality. No abdominopelvic ascites. Musculoskeletal: Multiple sclerotic lesions in the bones, particularly the pelvis, are stable, consistent with history of prostate cancer. The right proximal femur is involved. IMPRESSION: 1. The pattern of bowel is complicated. Multiple dilated and nondilated loops of small bowel are identified throughout the abdomen and pelvis. The overall pattern is similar since December 02, 2018 but there has been some worsening in small bowel dilatation since that time. The right pelvic loops, just prior to the right lower quadrant ostomy are mildly more prominent/dilated in the interval. The findings are consistent with a small-bowel obstruction, likely partial but significant. I suspect multiple  transition points. Overall, the amount of dilatation is mildly worsened since December of 2019 but the overall pattern of dilatation is similar since that time. The dilated loop of colon in the left lower quadrant parastomal hernia remains fluid-filled but is less dilated in the interval. 2. There is mucosal enhancement associated with loops of small bowel in the right side of the abdomen, likely representing active Crohn's disease. 3. Persistent gallbladder distention. Persistent prominence of the intra and extrahepatic bile ducts. The findings are similar in the interval. Recommend correlation with labs. 4. Persistent pancreatic duct dilatation. 5. No bony metastatic disease. 6. There appears to be abnormal soft tissue in the right side of the pelvis, abutting the right side of the prostate which is deviated to the right. This finding is stable. Electronically Signed   By: Dorise Bullion III M.D   On: 03/25/2019 23:25    Review of Systems  Constitutional: Negative.   HENT: Negative.   Eyes: Negative.   Respiratory: Positive for cough.   Cardiovascular: Negative.   Gastrointestinal: Positive for abdominal pain and nausea.  Genitourinary:       Prostate cancer with mets to bone.    Musculoskeletal: Positive for back pain.  Skin: Negative.   Neurological: Negative.   Endo/Heme/Allergies: Negative.   Psychiatric/Behavioral: Negative.   All other systems reviewed and are negative.  Blood pressure 132/83, pulse 77, temperature 98.5 F (36.9 C), temperature source Oral, resp. rate 18, height 5' 4.5" (1.638 m), weight 55.3 kg, SpO2 100 %. Physical Exam  Constitutional: He is oriented to person, place, and time. He appears well-developed and  well-nourished. He appears distressed (looks uncomfortable).  HENT:  Head: Normocephalic and atraumatic.  Mouth/Throat: No oropharyngeal exudate.  Eyes: Pupils are equal, round, and reactive to light. Conjunctivae are normal. Right eye exhibits no discharge.  Left eye exhibits no discharge. No scleral icterus.  Neck: Neck supple. No tracheal deviation present. No thyromegaly present.  Cardiovascular: Normal rate, regular rhythm, normal heart sounds and intact distal pulses.  No murmur heard. Respiratory: Effort normal and breath sounds normal. No respiratory distress. He exhibits no tenderness.  GI: Soft. He exhibits no distension. There is no abdominal tenderness. There is no rebound and no guarding.  Hypoactive bowel sounds. Stool and gas in ostomy bag.  Musculoskeletal:        General: No tenderness, deformity or edema.  Neurological: He is alert and oriented to person, place, and time. Coordination normal.  Sleeping, but arousable.  Skin: Skin is warm and dry. No rash noted. He is not diaphoretic. No erythema. No pallor.  Psychiatric: He has a normal mood and affect. His behavior is normal. Judgment and thought content normal.    Assessment/Plan: Partial small bowel obstruction Crohn's disease on immunosuppression Parastomal hernia Metastatic prostate cancer- on palliative treatment, not immediate threat to life. Ostomy in place Moderate protein calorie malnutrition Chronic pain   NPO NGT Small bowel protocol.  This obstruction looks chronic, but he clearly has some progression of small bowel distention. Minimize pain medication if possible.       Stark Klein 03/26/2019, 2:47 AM

## 2019-03-26 NOTE — Progress Notes (Addendum)
Family Medicine Teaching Service Daily Progress Note Intern Pager: [phone]  Patient name: Daniel Howard Medical record number: [id number] Date of birth: 10/15/1956 Age: 63 y.o. Gender: male  Primary Care Provider: Alycia Rossetti, MD Consultants: general surgery Code Status: DNR  Pt Overview and Major Events to Date:  4/4- patient admitted, general surgery consulted, abdominal pain improving  Assessment and Plan:  Abdominal pain 2/2 acute on chronic SBO: Confirmed by abdominal CT scan, which was also notable for multiple intestinal fistulas and areas of inflammation likely consistent with acute Crohn's flare.  General surgery is also managing this patient.  SBO protocol is being followed, although NG tube has not been placed yet.  We are managing patient's pain with morphine 4 mg every 4 hours as needed and acetaminophen.  He received morphine twice overnight. -NG tube placed this morning -Monitor patient's pain and colostomy output -Continue normal saline at 100 mL/h -Wean narcotic pain medication as able -Continue SBO protocol with n.p.o. status and NG on suction -Appreciate general surgery recommendations  Crohn's disease: Story of Crohn's disease with colectomy and ostomy.  Home medications include adalimumab 40 mg q. Wednesday, budesonide 9 mg daily.  CT abdomen consistent with acute Crohn's flare. -Since patient's abdominal pain is improving, we will continue to observe before starting a parenteral medication for a Crohn's flare -Administer Humira on Wednesday  Stage IV prostate cancer: Bone metastases have been visualized, and patient is being managed with androgen suppressive therapy.  Was initially on hospice care but is now only on palliative care.  PSA has responded to anti-androgen therapy. -Continue Xtandi 160 mg daily when no longer n.p.o.  H/o anemia- home meds: iron 39m daily. Hgb 11.9 on admission, 10.7 on 4/4. - hold home iron - CBC daily  Goals of  care-patient had appointment with palliative 4/2- note in chart confirms DNR and has another appointment for 4/10 for further discussion of goals of care. He states that he does not want any interventions in the event of cardiac arrest because he does not want a broken rib. - contact wife in am for goals of care clarification [phone] Protein calorie malnutrition- albumin 3.3 on admission.  Consistent with poor p.o. intake in the past 4 days per patient with likely chronic nutritional deficiency due to Crohn's disease.  Patient has multivitamin on home med list -Could consider nutrition consult -Continue Remeron 15 mg daily as a possible appetite booster when patient is no longer n.p.o.  Hypocalcemia- 8.2 on admission, corrected to 8.8. patient has calcium supplement on home med list. - consider restarting supplement when clinically appropriate  Insomnia- home meds: remeron - continue home med  FEN/GI: NPO with suction PPx: lovenox  Disposition: continue on med-surg  Subjective:  Patient reports that his abdominal pain is somewhat better, and describes it is intermittent.  He says that it is about a 6/10 in severity currently.  He received morphine once overnight after receiving it in the emergency department as well, and he says that this was helpful.  He is tolerating the NG tube.  Objective: Temp:  [98.2 F (36.8 C)-98.5 F (36.9 C)] 98.2 F (36.8 C) (04/04 0609) Pulse Rate:  [75-96] 77 (04/04 0609) Resp:  [13-21] 16 (04/04 0609) BP: (112-145)/(62-84) 116/76 (04/04 0609) SpO2:  [97 %-100 %] 100 % (04/04 0609) Weight:  [55.3 kg-56.2 kg] 55.3 kg (04/04 0210) Physical Exam: General: Lying in bed comfortably, NG tube in place on suction, draining yellow fluid Cardiovascular: RRR, no MRG Respiratory:  CTAB, no wheezing, no increased work of breathing Abdomen: Mildly tender to palpation diffusely but mostly in the lower abdominal region.  Minimal bowel sounds.  Colostomy output  with soft brown stool, no blood visualized. Extremities: No pedal edema, moves all extremities spontaneously  Laboratory: Recent Labs  Lab 03/25/19 1833 03/26/19 0256  WBC 10.7* 6.7  HGB 11.9* 10.7*  HCT 37.8* 33.5*  PLT 450* 383   Recent Labs  Lab 03/25/19 1833 03/26/19 0256  NA 135 134*  K 4.0 3.8  CL 100 104  CO2 22 22  BUN 7* 6*  CREATININE 1.07 1.02  CALCIUM 8.2* 7.3*  PROT 8.1  --   BILITOT 0.8  --   ALKPHOS 83  --   ALT 13  --   AST 22  --   GLUCOSE 84 74    Imaging/Diagnostic Tests: Ct Abdomen Pelvis W Contrast  Result Date: 03/25/2019 CLINICAL DATA:  Generalized abdominal pain and constipation. EXAM: CT ABDOMEN AND PELVIS WITH CONTRAST TECHNIQUE: Multidetector CT imaging of the abdomen and pelvis was performed using the standard protocol following bolus administration of intravenous contrast. CONTRAST:  168m OMNIPAQUE IOHEXOL 300 MG/ML  SOLN COMPARISON:  CT scans from December 02, 2018, December 29, 2018, and January 08, 2019. FINDINGS: Lower chest: No acute abnormality. Hepatobiliary: Gallbladder distention remains, similar since December 29, 2018. The liver is normal. Portal vein is normal. There is prominence of the intra and extrahepatic biliary tree which is stable. Pancreas: Pancreatic duct dilatation remains measuring up to 5 mm, unchanged. The remainder of the pancreas is normal. Spleen: An enhancing nodule in the anterior spleen is stable and of doubtful significance. The spleen is otherwise normal. Adrenals/Urinary Tract: Adrenal glands are normal. No perinephric stranding, stones, or masses. No acute hydronephrosis identified. Probable stones in the left side of the bladder, unchanged. The bladder is otherwise normal. No ureteral stones are noted. Stomach/Bowel: The bowel pattern is complicated. There are both dilated and nondilated loops of bowel in the abdomen. A dilated loop of small bowel in the right upper quadrant measures up to 6.3 cm today versus 5.5 cm  December 02, 2018 6.5 cm December 29, 2018 and 6.5 cm January 08, 2019. A loop of bowel in the right side of the pelvis measures 5.7 cm today versus 4.3 cm previously. There is some fecal material within this loop of bowel which is just proximal to a right lower quadrant ostomy. A dilated loop of small bowel deep in the pelvis on series 3, image 65 is similar since December 29, 2018. Multiple nondilated loops of small bowel are seen in the left side of the abdomen. The patient has a left-sided ostomy. A peristomal hernia is identified. There is a loop of descending colon within the hernia which is fluid-filled and mildly prominent measuring 3.1 cm today versus 4 cm December 29, 2018. The loops of small bowel within the left-sided parastomal hernia are decompressed. There is soft tissue adjacent to the rectum which is similar since previous studies. This soft tissue appears to abut the prostate in the prostate appears to be deviated to the right, also a stable finding. There is mucosal enhancement associated with right abdominal loops of small bowel. Vascular/Lymphatic: The aorta and branching vessels are normal. Prominent and enlarged left periaortic nodes are stable. Reproductive: The prostate appears to be deviated to the right and abuts increased soft tissue in the right side of the pelvis which has been stable when compared to previous studies. Other: No abdominal  wall hernia or abnormality. No abdominopelvic ascites. Musculoskeletal: Multiple sclerotic lesions in the bones, particularly the pelvis, are stable, consistent with history of prostate cancer. The right proximal femur is involved. IMPRESSION: 1. The pattern of bowel is complicated. Multiple dilated and nondilated loops of small bowel are identified throughout the abdomen and pelvis. The overall pattern is similar since December 02, 2018 but there has been some worsening in small bowel dilatation since that time. The right pelvic loops, just prior to the  right lower quadrant ostomy are mildly more prominent/dilated in the interval. The findings are consistent with a small-bowel obstruction, likely partial but significant. I suspect multiple transition points. Overall, the amount of dilatation is mildly worsened since December of 2019 but the overall pattern of dilatation is similar since that time. The dilated loop of colon in the left lower quadrant parastomal hernia remains fluid-filled but is less dilated in the interval. 2. There is mucosal enhancement associated with loops of small bowel in the right side of the abdomen, likely representing active Crohn's disease. 3. Persistent gallbladder distention. Persistent prominence of the intra and extrahepatic bile ducts. The findings are similar in the interval. Recommend correlation with labs. 4. Persistent pancreatic duct dilatation. 5. No bony metastatic disease. 6. There appears to be abnormal soft tissue in the right side of the pelvis, abutting the right side of the prostate which is deviated to the right. This finding is stable. Electronically Signed   By: Dorise Bullion III M.D   On: 03/25/2019 23:25   Dg Abd Acute 2+v W 1v Chest  Result Date: 03/17/2019 CLINICAL DATA:  Acute generalized abdominal pain, history of Crohn's disease. EXAM: DG ABDOMEN ACUTE W/ 1V CHEST COMPARISON:  Radiograph of January 08, 2019. FINDINGS: There is no evidence of free intraperitoneal air. Mildly dilated small bowel loops are noted with air-fluid levels concerning for distal small bowel obstruction. No colonic dilatation is noted. Phleboliths are noted in the pelvis. Heart size and mediastinal contours are within normal limits. Both lungs are clear. IMPRESSION: Mildly dilated small bowel loops are noted with air-fluid levels concerning for possible distal small bowel obstruction. No acute cardiopulmonary disease. Electronically Signed   By: Marijo Conception, M.D.   On: 03/17/2019 14:58     Kathrene Alu, MD 03/26/2019,  7:41 AM PGY-2, [place] Intern pager: [phone], text pages welcome

## 2019-03-26 NOTE — ED Notes (Signed)
Per pt. Request spoke to wife on phone. Informed wife consult with unassigned medical admission and general surgery.

## 2019-03-26 NOTE — ED Notes (Signed)
ED TO INPATIENT HANDOFF REPORT  ED Nurse Name and Phone #:  Sol Blazing, RN [phone]  S Name/Age/Gender Daniel Howard 63 y.o. male Room/Bed: 025C/025C  Code Status   Code Status: Prior  Home/SNF/Other Home Patient oriented to: self, place, time and situation Is this baseline? Yes   Triage Complete: Triage complete  Chief Complaint Abd pain  Triage Note Pt arrives with c/o of generalized abd pain with constipation. Denies any n/v.     Allergies Allergies  Allergen Reactions  . Penicillins Hives    Has patient had a PCN reaction causing immediate rash, facial/tongue/throat swelling, SOB or lightheadedness with hypotension: Yes Has patient had a PCN reaction causing severe rash involving mucus membranes or skin necrosis: No Has patient had a PCN reaction that required hospitalization: No Has patient had a PCN reaction occurring within the last 10 years: No If all of the above answers are "NO", then may proceed with Cephalosporin use.     Level of Care/Admitting Diagnosis ED Disposition    ED Disposition Condition [facility]: [place] [[id number]]  Level of Care: Med-Surg [16]  Diagnosis: Small bowel obstruction [facility]) [[id number]]  Admitting Physician: Richarda Osmond [[id number]]  Attending Physician: Talbert Cage L [[id number]]  Estimated length of stay: past midnight tomorrow  Certification:: I certify this patient will need inpatient services for at least 2 midnights  PT Class (Do Not Modify): Inpatient [101]  PT Acc Code (Do Not Modify): Private [1]       B Medical/Surgery History Past Medical History:  Diagnosis Date  . Abnormal finding of biliary tract    MRCP shows pancreatic/biliary tract dilation. EUS 2010 confirmed dilation but no chronic pancreaitis or mass. Vascular ectasia crimpoing distal CBD.   [name] Anxiety   . 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 Duodenal ulcer 2010   nsaids  . History of blood transfusion 1992   "related to colon OR"  . Peristomal hernia   . Prostate cancer ([place]) 2018  . SBO (small bowel obstruction) ([place]) 11/10/2018  . Small bowel obstruction ([place]) 10/2017; 11/21/2017; 02/16/2018  . Spigelian hernia    bilateral   Past Surgical History:  Procedure Laterality Date  . APPENDECTOMY  1992   at time of exp laparotomy at which time he was noted to have fistulizing Crohn's rather than UC  . COLON SURGERY    . COLONOSCOPY [name] 08/23/2014   HUD:JSHFWYO proctoscopy with possible fistulous opening in thebase of rectal/anal stump.    . COLOSTOMY  1992   transverse loop colostomy secondary to a stricture  . ESOPHAGOGASTRODUODENOSCOPY  05/2009   SLF: multiple antral erosions, large ulcer at ansatomosis (postsurgical changes at duodenal bulb and second portion of duodenum) BX c/x NSAIDS.  . EUS  10/04/2009   Dr. Estill Bakes dilated CBD and main pancreatic duct.  No pancreatic  . EXPLORATORY LAPAROTOMY  1992  . FLEXIBLE SIGMOIDOSCOPY  1988   Dr. Laural Golden- suggested rohn's disease but the biopsies were not collaborative.  [name] FLEXIBLE SIGMOIDOSCOPY [name] 09/08/2016   Procedure: FLEXIBLE SIGMOIDOSCOPY;  Surgeon: Wonda Horner, MD;  Location: [facility] ENDOSCOPY;  Service: Gastroenterology;  Laterality: [name];  . HEMICOLOECTOMY  [place]   . HERNIA REPAIR  1996   incarcerated periostial hernia with additional surgery in 1999  . IR FLUORO GUIDE PORT INSERTION RIGHT  04/03/2017  . IR US GUIDE VASC ACCESS RIGHT  04/03/2017     A IV Location/Drains/Wounds Patient Lines/Drains/Airways Status   Active Line/Drains/Airways    Name:    Placement date:   Placement time:   Site:   Days:   Implanted Port Right Chest   -    -    Chest      Peripheral IV 03/17/19 Right Antecubital   03/17/19    1551    Antecubital   9   Peripheral IV 03/25/19 Right Antecubital   03/25/19    2110    Antecubital   1   Ileostomy RUQ   07/26/14    2348    RUQ   1704   Ileostomy Standard (end) RUQ   01/23/91    1200    RUQ   10289          Intake/Output Last 24 hours No intake or output data in the 24 hours ending 03/26/19 0117  Labs/Imaging Results for orders placed or performed during the hospital encounter of 03/25/19 (from the past 48 hour(s))  Lipase, blood     Status: None   Collection Time: 03/25/19  6:33 PM  Result Value Ref Range   Lipase 20 11 - 51 U/L    Comment: Performed at [facility], [place] [address] [place], [place] [address]  Comprehensive metabolic panel     Status: Abnormal   Collection Time: 03/25/19  6:33 PM  Result Value Ref Range   Sodium 135 135 - 145 mmol/L   Potassium 4.0 3.5 - 5.1 mmol/L   Chloride 100 98 - 111 mmol/L   CO2 22 22 - 32 mmol/L   Glucose, Bld 84 70 - 99 mg/dL   BUN 7 (L) 8 - 23 mg/dL   Creatinine, Ser 1.07 0.61 - 1.24 mg/dL   Calcium 8.2 (L) 8.9 - 10.3 mg/dL   Total Protein 8.1 6.5 - 8.1 g/dL   Albumin 3.3 (L) 3.5 - 5.0 g/dL   AST 22 15 - 41 U/L   ALT 13 0 - 44 U/L   Alkaline Phosphatase 83 38 - 126 U/L   Total Bilirubin 0.8 0.3 - 1.2 mg/dL   GFR calc non Af Amer >60 >60 mL/min   GFR calc Af Amer >60 >60 mL/min   Anion gap 13 5 - 15    Comment: Performed at [facility] [address], [place], [place] [address]  CBC     Status: Abnormal   Collection Time: 03/25/19  6:33 PM  Result Value Ref Range   WBC 10.7 (H) 4.0 - 10.5 K/uL   RBC 3.97 (L) 4.22 - 5.81 MIL/uL   Hemoglobin 11.9 (L) 13.0 - 17.0 g/dL   HCT 37.8 (L) 39.0 - 52.0 %   MCV 95.2 80.0 - 100.0 fL   MCH 30.0 26.0 - 34.0 pg   MCHC 31.5 30.0 - 36.0 g/dL   RDW 13.2 11.5 - 15.5 %   Platelets 450 (H) 150 - 400 K/uL    nRBC 0.0 0.0 - 0.2 %    Comment: Performed at [facility] [address] [place], [place] [address]  Urinalysis, Routine w reflex microscopic     Status: Abnormal   Collection Time: 03/25/19  7:40 PM  Result Value Ref Range   Color, Urine YELLOW YELLOW   APPearance CLEAR CLEAR   Specific Gravity, Urine 1.028 1.005 - 1.030   pH 5.0 5.0 - 8.0   Glucose, UA NEGATIVE NEGATIVE mg/dL   Hgb urine dipstick NEGATIVE NEGATIVE   Bilirubin Urine NEGATIVE NEGATIVE   Ketones, ur 80 (A) NEGATIVE mg/dL   Protein, ur NEGATIVE NEGATIVE mg/dL   Nitrite NEGATIVE NEGATIVE   Leukocytes,Ua NEGATIVE NEGATIVE    Comment: Performed at [facility] [address], [place], [place] [address]   Ct Abdomen Pelvis W Contrast  Result Date: 03/25/2019 CLINICAL DATA:  Generalized abdominal pain and constipation. EXAM: CT ABDOMEN AND PELVIS WITH CONTRAST TECHNIQUE: Multidetector CT imaging of the abdomen and pelvis was performed using the standard protocol following bolus administration of intravenous contrast. CONTRAST:  166m OMNIPAQUE IOHEXOL 300 MG/ML  SOLN COMPARISON:  CT scans from December 02, 2018, December 29, 2018, and January 08, 2019. FINDINGS: Lower chest: No acute abnormality. Hepatobiliary: Gallbladder distention remains, similar since December 29, 2018. The liver is normal. Portal vein is normal. There is prominence of the intra and extrahepatic biliary tree which is stable. Pancreas: Pancreatic duct dilatation remains measuring up to 5 mm, unchanged. The remainder of the pancreas is normal. Spleen: An enhancing nodule in the anterior spleen is stable and of doubtful significance. The spleen is otherwise normal. Adrenals/Urinary Tract: Adrenal glands are normal. No perinephric stranding, stones, or masses. No acute hydronephrosis identified. Probable stones in the left side of the bladder, unchanged. The bladder is otherwise normal. No ureteral stones are noted. Stomach/Bowel: The bowel pattern is  complicated. There are both dilated and nondilated loops of bowel in the abdomen. A dilated loop of small bowel in the right upper quadrant measures up to 6.3 cm today versus 5.5 cm December 02, 2018 6.5 cm December 29, 2018 and 6.5 cm January 08, 2019. A loop of bowel in the right side of the pelvis measures 5.7 cm today versus 4.3 cm previously. There is some fecal material within this loop of bowel which is just proximal to a right lower quadrant ostomy. A dilated loop of small bowel deep in the pelvis on series 3, image 65 is similar since December 29, 2018. Multiple nondilated loops of small bowel are seen in the left side of the abdomen. The patient has a left-sided ostomy. A peristomal hernia is identified. There is a loop of descending colon within the hernia which is fluid-filled and mildly prominent measuring 3.1 cm today versus 4 cm December 29, 2018. The loops of small bowel within the left-sided parastomal hernia are decompressed. There is soft tissue adjacent to the rectum which is similar since previous studies. This soft tissue appears to abut the prostate in the prostate appears to be deviated to the right, also a stable finding. There is mucosal enhancement associated with right abdominal loops of small bowel. Vascular/Lymphatic: The aorta and branching vessels are normal. Prominent and enlarged left periaortic nodes are stable. Reproductive: The prostate appears to be deviated to the right and abuts increased soft tissue in the right side of the pelvis which has been stable when compared to previous studies. Other: No abdominal wall hernia or abnormality. No abdominopelvic ascites. Musculoskeletal: Multiple sclerotic lesions in the bones, particularly the pelvis, are stable, consistent with history of prostate cancer. The right proximal femur is involved. IMPRESSION: 1. The pattern of bowel is complicated. Multiple dilated and nondilated loops of small bowel are identified throughout the abdomen  and  pelvis. The overall pattern is similar since December 02, 2018 but there has been some worsening in small bowel dilatation since that time. The right pelvic loops, just prior to the right lower quadrant ostomy are mildly more prominent/dilated in the interval. The findings are consistent with a small-bowel obstruction, likely partial but significant. I suspect multiple transition points. Overall, the amount of dilatation is mildly worsened since December of 2019 but the overall pattern of dilatation is similar since that time. The dilated loop of colon in the left lower quadrant parastomal hernia remains fluid-filled but is less dilated in the interval. 2. There is mucosal enhancement associated with loops of small bowel in the right side of the abdomen, likely representing active Crohn's disease. 3. Persistent gallbladder distention. Persistent prominence of the intra and extrahepatic bile ducts. The findings are similar in the interval. Recommend correlation with labs. 4. Persistent pancreatic duct dilatation. 5. No bony metastatic disease. 6. There appears to be abnormal soft tissue in the right side of the pelvis, abutting the right side of the prostate which is deviated to the right. This finding is stable. Electronically Signed   By: Dorise Bullion III M.D   On: 03/25/2019 23:25    Pending Labs Unresulted Labs (From admission, onward)    Start     Ordered   Signed and Held  HIV antibody (Routine Testing)  Once,   R     Signed and Held   Signed and Held  CBC  (enoxaparin (LOVENOX)    CrCl >/= 30 ml/min)  Once,   R    Comments:  Baseline for enoxaparin therapy IF NOT ALREADY DRAWN.  Notify MD if PLT < 100 K.    Signed and Held   Signed and Held  Creatinine, serum  (enoxaparin (LOVENOX)    CrCl >/= 30 ml/min)  Once,   R    Comments:  Baseline for enoxaparin therapy IF NOT ALREADY DRAWN.    Signed and Held   Signed and Held  Creatinine, serum  (enoxaparin (LOVENOX)    CrCl >/= 30 ml/min)  Weekly,    R    Comments:  while on enoxaparin therapy    Signed and Held   Signed and Held  Basic metabolic panel  Tomorrow morning,   R     Signed and Held   Signed and Held  CBC  Tomorrow morning,   R     Signed and Held          Vitals/Pain Today's Vitals   03/26/19 0015 03/26/19 0030 03/26/19 0045 03/26/19 0100  BP: 124/84 124/62 129/81 127/80  Pulse: 82 78 76 78  Resp: 15 13 15 13   Temp:      TempSrc:      SpO2: 98% 99% 99% 99%  Weight:      Height:      PainSc:        Isolation Precautions No active isolations  Medications Medications  morphine 4 MG/ML injection 4 mg (has no administration in time range)  sodium chloride flush (NS) 0.9 % injection 3 mL (3 mLs Intravenous Given 03/25/19 2113)  sodium chloride 0.9 % bolus 1,000 mL (0 mLs Intravenous Stopped 03/25/19 2305)  morphine 4 MG/ML injection 4 mg (4 mg Intravenous Given 03/25/19 2112)  ondansetron (ZOFRAN) injection 4 mg (4 mg Intravenous Given 03/25/19 2112)  iohexol (OMNIPAQUE) 300 MG/ML solution 100 mL (100 mLs Intravenous Contrast Given 03/25/19 2245)    Mobility walks with person assist  Low fall risk   Focused Assessments Pulmonary Assessment Handoff:  Lung sounds:   O2 Device: Room Air        R Recommendations: See Admitting Provider Note  Report given to:   Additional Notes:

## 2019-03-26 NOTE — Progress Notes (Signed)
FPTS Interim Progress Note  S: Went to assess patient after his nurse called to tell me that his abdominal pain has worsened.  He describes his pain is intermittent and "the same as the pain that I had when I came in to the emergency room last night."  When he has an episode of pain, he says that his abdomen feels hard.  He has been asking for pain medication more frequently and wanted pain medication when I walked in the room.    O: Vital signs are stable and within normal limits.  On exam, he appears uncomfortable and does have intermittent distention in his lower abdomen on palpation.  A/P: Intermittent distention could be due to his partial small bowel obstruction, although a concomitant Crohn's flare is possible as well.  Will continue SBO protocol and keep the NG tube in place.  We will increase morphine frequency to 4 mg every 2 hours PRN.  Will consider adding IV steroids for treatment of Crohn's flare if abdominal pain does not resolve with continued SBO protocol.  Amanda C. Shan Levans, MD PGY-2, [facility] 03/26/2019 3:40 PM

## 2019-03-26 NOTE — H&P (Addendum)
[facility] Admission History and Physical Service Pager: [phone]  Patient name: Daniel Howard Medical record number: [id number] Date of birth: 31-Mar-1956 Age: 63 y.o. Gender: male  Primary Care Provider: Alycia Rossetti, MD Consultants: gen surg Code Status: DNR  Chief Complaint: Abdominal pain  Assessment and Plan: Daniel Howard is a 63 y.o. male presenting with 4 days of generalized abdominal pain worse in the right lower quadrant and found to have worsening chronic partial small bowel obstruction on imaging. PMH is significant for multiple episodes of SBO 2/2 adhesions from multiple abdominal surgeries, prostate cancer, Crohn's disease  Abdominal pain, acute on chronic- h/o multiple abdominal surgeries and poorly controlled CD. Abdominal CT positive for multiple dilated small bowel sections consistent with partial small bowel obstruction worsened from CT in December.  Patient has had multiple hospitalization in the past few months with similar problem.  Also has persistent gallbladder distention and pancreatic duct dilation and stable soft tissue mass on right side of prostate. Urinalysis negative for infection, lipase 20. Patient has apparent severe discomfort in the right lower quadrant with palpation on exam, however, his vital signs are stable.  Denies any vomiting.  Reports some nausea.  Received morphine in ED with moderate improvement in pain. Etiology likely 2/2 abdominal adhesions given multiple prior surgeries.  Patient is status post colectomy with ostomy in place.  Abdominal pain could also be secondary to a flareup of his Crohn's disease.  - admit to med-surg, attending Dr. Erin Hearing - f/u gen surg recs - implement bowel rest with NPO  - consider NG suction (recommended by surgery per note) - treat pain  - morphine 71m q4hr PRN titrate down as needed  - tylenol 6590mq8hr PRN - zofran PRN -Normal saline 100 cc/h while NPO - vitals per  floor protocol - strict I/Os -Follow-up on BMP, CBC am  Stage IV metastatic prostate cancer-patient with recurrent prostate cancer (initial diagnosis August 2018) with bony mets status post chemoradiation and castration.  Currently on anti-androgen and followed by urology.  Patient is also seen by oncology and palliative care.  Seen by oncology recently (2/11) with recommendation to continue with anti-androgen medication given decrease in PSA.  Adherent with medication, including today. Takes Xtandi 16026maily - continue home med  Crohn's disease-patient with long history of Crohn's disease.  Status post colectomy with ostomy.  He is followed by GI in the outpatient setting.  Home meds: adalimumab 29m29mery Wednesday, budesonide 9mg 36mly. Patient did not take this medication this week. Inflammation on abdominal CT this admission consistent with flare.  - continue Humira if still here on Wednesday - continue budesonide  H/o anemia- home meds: iron 325mg 68my. Hgb 11.9 on admission - hold home iron - CBC am  Goals of care-patient had appointment with palliative 4/2- note in chart confirms DNR and has another appointment for 4/10 for further discussion of goals of care. He states that he does not want any interventions in the event of cardiac arrest because he does not want a broken rib. - contact wife in am for goals of care clarification [phone] calorie malnutrition- albumin 3.3 on admission.  Consistent with poor p.o. intake in the past 4 days per patient.  Appears to have normal appetite outside of episode of abdominal pain.  Patient has multivitamin on home med list -Could consider nutrition consult -Continue Remeron 15 mg daily as a possible appetite booster  Hypocalcemia- 8.2 on admission, corrected to 8.8.  patient has calcium supplement on home med list. - consider restarting supplement when clinically appropriate  Insomnia- home meds: remeron - continue home  med  FEN/GI: mIV NS 173m/hr, NPO Prophylaxis: lovenox  Disposition: med-surg  History of Present Illness:  Daniel RIVKINis a 63y.o. male presenting with SBO. Patient states that he began having increased abdominal pain and abnormal stools 4 and half days ago.  Prior to this time he had normal appetite and bowel movements.  He denies having any solid foods for 3 days and attempt to treat his small bowel obstruction at home as he has been through this process several times.  He has had sips of broth with medications. He presented to the ED because the pain was not tolerable.  He tried to take Oxycodone at home with little relief. He states that passing stool or gas does improve the pain. The pain is constant but is mild at baseline and then has frequent acute increase in severe pain when his bowels move.  He received morphine in the ED which he states moderately improved his pain. He describes his stool as normal color but much more watery than his normal baseline in colostomy bag.  He denies nausea or vomiting.  He endorses a productive cough x3 days with a yellowish-white sputum.  Denies fever, chills, shortness of breath, rhinorrhea, throat pain.  Review Of Systems: Per HPI with the following additions:  Review of Systems  Constitutional: Negative for chills and fever.  HENT: Negative for congestion and sore throat.   Respiratory: Positive for cough and sputum production. Negative for hemoptysis and shortness of breath.   Cardiovascular: Negative for chest pain.  Gastrointestinal: Positive for abdominal pain and diarrhea. Negative for blood in stool, constipation, nausea and vomiting.    Patient Active Problem List   Diagnosis Date Noted  . Incarcerated hernia 01/09/2019  . Palliative care encounter   . Small bowel obstruction due to adhesions ([facility] 12/29/2018  . Small bowel obstruction ([facility] 12/03/2018  . Partial small bowel obstruction ([facility] 11/01/2018  . SBO (small bowel  obstruction) ([facility] 09/30/2018  . Iron deficiency anemia 07/03/2018  . Colostomy status ([facility] 07/03/2018  . Nausea without vomiting 11/10/2017  . Incisional hernia 09/22/2017  . Abdominal pain of multiple sites 09/22/2017  . Goals of care, counseling/discussion 04/02/2017  . Prostate cancer metastatic to bone ([facility] 04/02/2017  . Elevated PSA 03/04/2017  . Osteopenia 11/03/2016  . Pelvic mass in male 09/04/2016  . 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 Exacerbation of Crohn's disease ([facility] 09/04/2016  . Family history of colon cancer 10/12/2014  . Protein-calorie malnutrition, severe ([facility] 07/27/2014  . Loss of weight 06/09/2014  . Crohn's disease of both small and large intestine with complication ([facility] [id number]/96/2836 . Boils 02/11/2013  . Ventral hernia 12/11/2009  . Anemia 12/03/2009  . Regional enteritis/Crohn's 01/25/2007    Past Medical History: Past Medical History:  Diagnosis Date  . Abnormal finding of biliary tract    MRCP shows pancreatic/biliary tract dilation. EUS 2010 confirmed dilation but no chronic pancreaitis or mass. Vascular ectasia crimpoing distal CBD.   .Marland KitchenAnxiety   . 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 Duodenal ulcer 2010  nsaids  . History of blood transfusion 1992   "related to colon OR"  . Peristomal hernia   . Prostate cancer ([place]) 2018  . SBO (small bowel obstruction) ([place]) 11/10/2018  . Small bowel obstruction ([place]) 10/2017; 11/21/2017; 02/16/2018  . Spigelian hernia    bilateral    Past Surgical History: Past Surgical History:  Procedure Laterality Date  . APPENDECTOMY  1992   at time of exp laparotomy at  which time he was noted to have fistulizing Crohn's rather than UC  . COLON SURGERY    . COLONOSCOPY [name] 08/23/2014   EXB:MWUXLKG proctoscopy with possible fistulous opening in thebase of rectal/anal stump.    . COLOSTOMY  1992   transverse loop colostomy secondary to a stricture  . ESOPHAGOGASTRODUODENOSCOPY  05/2009   SLF: multiple antral erosions, large ulcer at ansatomosis (postsurgical changes at duodenal bulb and second portion of duodenum) BX c/x NSAIDS.  . EUS  10/04/2009   Dr. Estill Bakes dilated CBD and main pancreatic duct.  No pancreatic  . EXPLORATORY LAPAROTOMY  1992  . FLEXIBLE SIGMOIDOSCOPY  1988   Dr. Laural Golden- suggested rohn's disease but the biopsies were not collaborative.  [name] FLEXIBLE SIGMOIDOSCOPY [name] 09/08/2016   Procedure: FLEXIBLE SIGMOIDOSCOPY;  Surgeon: Wonda Horner, MD;  [facility] ENDOSCOPY;  Service: Gastroenterology;  Laterality: [name];  . HEMICOLOECTOMY W/ ANASTOMOSIS  1993   R- Dr.DeMason   . HERNIA REPAIR  1996   incarcerated periostial hernia with additional surgery in 1999  . IR FLUORO GUIDE PORT INSERTION RIGHT  04/03/2017  . IR US GUIDE VASC ACCESS RIGHT  04/03/2017    Social History: Social History   Tobacco Use  . Smoking status: Former Smoker    Packs/day: 0.75    Years: 32.00    Pack years: 24.00    Types: Cigarettes    Last attempt to quit: 12/21/2009    Years since quitting: 9.2  . Smokeless tobacco: Never Used  Substance Use Topics  . Alcohol use: No    Alcohol/week: 0.0 standard drinks    Comment: Former drinker  . Drug use: No   Additional social history: Prior smoker, lives with wife who is a smoker Please also refer to relevant sections of EMR.  Family History: Family History  Problem Relation Age of Onset  . Cancer Father        prostate   . Prostate cancer Father   . Colon cancer Father 82  . Hypertension Sister   . Cancer Sister   . Depression Sister   . Breast cancer Sister   . COPD Sister   . Aneurysm Brother         deceased, brain aneurysm   Allergies and Medications: Allergies  Allergen Reactions  . Penicillins Hives    Has patient had a PCN reaction causing immediate rash, facial/tongue/throat swelling, SOB or lightheadedness with hypotension: Yes Has patient had a PCN reaction causing severe rash involving mucus membranes or skin necrosis: No Has patient had a PCN reaction that required hospitalization: No Has patient had a PCN reaction occurring within the last 10 years: No If all of the above answers are "NO", then may proceed with Cephalosporin use.    Current Facility-Administered Medications on File Prior to Encounter  Medication Dose Route Frequency Provider Last Rate Last Dose  . sodium chloride flush (NS) 0.9 % injection 10 mL  10 mL Intravenous PRN [name], MD   10 mL at 11/20/17 4010   Current Outpatient Medications on  File Prior to Encounter  Medication Sig Dispense Refill  . Adalimumab (HUMIRA PEN) 40 MG/0.8ML PNKT Inject 1 Syringe into the skin once a week. (Patient taking differently: Inject 40 mg into the skin every Wednesday. ) 4 each 3  . albuterol (PROVENTIL HFA) 108 (90 Base) MCG/ACT inhaler Inhale 2 puffs into the lungs every 6 (six) hours as needed for wheezing or shortness of breath.    . budesonide (ENTOCORT EC) 3 MG 24 hr capsule TAKE 3 CAPSULES BY MOUTH DAILY. (Patient taking differently: Take 9 mg by mouth daily. ) 90 capsule 2  . Calcium Carb-Cholecalciferol (CALCIUM 600-D PO) Take 2 tablets by mouth daily.    . ferrous sulfate 325 (65 FE) MG tablet Take 325 mg by mouth daily with breakfast.    . lidocaine-prilocaine (EMLA) cream Apply 1 application topically daily as needed (when accessing port).     . mirtazapine (REMERON) 15 MG tablet TAKE 1 TABLET BY MOUTH AT BEDTIME (Patient taking differently: Take 15 mg by mouth at bedtime. ) 90 tablet 1  . Multiple Vitamins-Minerals (CENTRUM SILVER 50+MEN) TABS Take 1 tablet by mouth daily with breakfast.    .  oxyCODONE-acetaminophen (PERCOCET/ROXICET) 5-325 MG tablet Take 1 tablet by mouth every 4 (four) hours as needed for moderate pain. 60 tablet 0  . SIMETHICONE PO Take 1-2 tablets by mouth as needed (gas/flatulence).     [name] 40 MG capsule TAKE 4 CAPSULES (160 MG TOTAL) BY MOUTH DAILY. (Patient taking differently: Take 160 mg by mouth daily. ) 120 capsule 0  . doxycycline (VIBRAMYCIN) 100 MG capsule Take 1 capsule (100 mg total) by mouth 2 (two) times daily. One po bid x 7 days (Patient not taking: Reported on 03/25/2019) 14 capsule 0  . sulfamethoxazole-trimethoprim (BACTRIM DS,SEPTRA DS) 800-160 MG tablet Take 1 tablet by mouth 2 (two) times daily. (Patient not taking: Reported on 03/25/2019) 14 tablet 0    Objective: BP 127/80   Pulse 78   Temp 98.5 F (36.9 C) (Oral)   Resp 13   Ht 5' 4.5" (1.638 m)   Wt 56.2 kg   SpO2 99%   BMI 20.96 kg/m  Exam: General: patient resting comfortably with intermittent episodes of apparent severe abdominal pain Eyes: negative injections or drainage ENTM: negative for erythema, edema, exudates. Positive cough Neck: soft, non tender Cardiovascular: RRR, no murmur appreciated, quick cap refill, strong pedal pulses bilaterally, no LE edema Respiratory: no increased WOB, no wheezing, mild expiratory rhonchi on right side Gastrointestinal: significant scare tissue on abdomen, no active drainage or signs of infection. Visible small bowel section exposed in colostomy bag appears red, tissue around ostomy site without calor or palor. Stool soft and pale green. Abdomen non-tender to soft palpation. Large hernia on left mid to lower quadrant. Active bowel sounds diffusely that could be heard standing next to patient without stethoscope.  MSK: thin frame Derm: significant scarring on abdomen, no signs of lesions or active infections Neuro: alert and oriented, able to follow commands and move all extremities appropriately Psych: normal mood and affect  Labs and  Imaging: CBC BMET  Recent Labs  Lab 03/25/19 1833  WBC 10.7*  HGB 11.9*  HCT 37.8*  PLT 450*   Recent Labs  Lab 03/25/19 1833  NA 135  K 4.0  CL 100  CO2 22  BUN 7*  CREATININE 1.07  GLUCOSE 84  CALCIUM 8.2*     Ct Abdomen Pelvis W Contrast  Result Date: 03/25/2019 CLINICAL DATA:  Generalized  abdominal pain and constipation. EXAM: CT ABDOMEN AND PELVIS WITH CONTRAST TECHNIQUE: Multidetector CT imaging of the abdomen and pelvis was performed using the standard protocol following bolus administration of intravenous contrast. CONTRAST:  119m OMNIPAQUE IOHEXOL 300 MG/ML  SOLN COMPARISON:  CT scans from December 02, 2018, December 29, 2018, and January 08, 2019. FINDINGS: Lower chest: No acute abnormality. Hepatobiliary: Gallbladder distention remains, similar since December 29, 2018. The liver is normal. Portal vein is normal. There is prominence of the intra and extrahepatic biliary tree which is stable. Pancreas: Pancreatic duct dilatation remains measuring up to 5 mm, unchanged. The remainder of the pancreas is normal. Spleen: An enhancing nodule in the anterior spleen is stable and of doubtful significance. The spleen is otherwise normal. Adrenals/Urinary Tract: Adrenal glands are normal. No perinephric stranding, stones, or masses. No acute hydronephrosis identified. Probable stones in the left side of the bladder, unchanged. The bladder is otherwise normal. No ureteral stones are noted. Stomach/Bowel: The bowel pattern is complicated. There are both dilated and nondilated loops of bowel in the abdomen. A dilated loop of small bowel in the right upper quadrant measures up to 6.3 cm today versus 5.5 cm December 02, 2018 6.5 cm December 29, 2018 and 6.5 cm January 08, 2019. A loop of bowel in the right side of the pelvis measures 5.7 cm today versus 4.3 cm previously. There is some fecal material within this loop of bowel which is just proximal to a right lower quadrant ostomy. A dilated loop of  small bowel deep in the pelvis on series 3, image 65 is similar since December 29, 2018. Multiple nondilated loops of small bowel are seen in the left side of the abdomen. The patient has a left-sided ostomy. A peristomal hernia is identified. There is a loop of descending colon within the hernia which is fluid-filled and mildly prominent measuring 3.1 cm today versus 4 cm December 29, 2018. The loops of small bowel within the left-sided parastomal hernia are decompressed. There is soft tissue adjacent to the rectum which is similar since previous studies. This soft tissue appears to abut the prostate in the prostate appears to be deviated to the right, also a stable finding. There is mucosal enhancement associated with right abdominal loops of small bowel. Vascular/Lymphatic: The aorta and branching vessels are normal. Prominent and enlarged left periaortic nodes are stable. Reproductive: The prostate appears to be deviated to the right and abuts increased soft tissue in the right side of the pelvis which has been stable when compared to previous studies. Other: No abdominal wall hernia or abnormality. No abdominopelvic ascites. Musculoskeletal: Multiple sclerotic lesions in the bones, particularly the pelvis, are stable, consistent with history of prostate cancer. The right proximal femur is involved. IMPRESSION: 1. The pattern of bowel is complicated. Multiple dilated and nondilated loops of small bowel are identified throughout the abdomen and pelvis. The overall pattern is similar since December 02, 2018 but there has been some worsening in small bowel dilatation since that time. The right pelvic loops, just prior to the right lower quadrant ostomy are mildly more prominent/dilated in the interval. The findings are consistent with a small-bowel obstruction, likely partial but significant. I suspect multiple transition points. Overall, the amount of dilatation is mildly worsened since December of 2019 but the  overall pattern of dilatation is similar since that time. The dilated loop of colon in the left lower quadrant parastomal hernia remains fluid-filled but is less dilated in the interval. 2. There is  mucosal enhancement associated with loops of small bowel in the right side of the abdomen, likely representing active Crohn's disease. 3. Persistent gallbladder distention. Persistent prominence of the intra and extrahepatic bile ducts. The findings are similar in the interval. Recommend correlation with labs. 4. Persistent pancreatic duct dilatation. 5. No bony metastatic disease. 6. There appears to be abnormal soft tissue in the right side of the pelvis, abutting the right side of the prostate which is deviated to the right. This finding is stable. Electronically Signed   By: Dorise Bullion III M.D   On: 03/25/2019 23:25   I have seen and evaluated the patient with Dr. Ouida Sills. I am in agreement with the note above in its revised form. My additions are in blue.  Marjie Skiff, MD Family Medicine, PGY-3  Richarda Osmond, DO 03/26/2019, 1:11 AM PGY-1, [place] [facility] pager: [phone], text pages welcome

## 2019-03-26 NOTE — Progress Notes (Signed)
Patient ID: Daniel Howard, male   DOB: 10/24/1956, 63 y.o.   MRN: [id number]       Subjective: Patient feels somewhat better than on admit.  No nausea.  Denies feeling bloated.  Objective: Vital signs in last 24 hours: Temp:  [98.2 F (36.8 C)-98.5 F (36.9 C)] 98.2 F (36.8 C) (04/04 0609) Pulse Rate:  [75-96] 77 (04/04 0609) Resp:  [13-21] 16 (04/04 0609) BP: (112-145)/(62-84) 116/76 (04/04 0609) SpO2:  [97 %-100 %] 100 % (04/04 0609) Weight:  [55.3 kg-56.2 kg] 55.3 kg (04/04 0210) Last BM Date: 03/25/19  Intake/Output from previous day: 04/03 0701 - 04/04 0700 In: 2.8 [I.V.:2.8] Out: -  Intake/Output this shift: Total I/O In: -  Out: 200 [Urine:200]  PE: Gen: NAD Heart: regular Lungs: CTAB Abd: soft, multiple chronic hernias all over abdomen.  Ileostomy with thick pudding like stool present in his bag, minimal abdominal pain, minimal distention.  Lab Results:  Recent Labs    03/25/19 1833 03/26/19 0256  WBC 10.7* 6.7  HGB 11.9* 10.7*  HCT 37.8* 33.5*  PLT 450* 383   BMET Recent Labs    03/25/19 1833 03/26/19 0256  NA 135 134*  K 4.0 3.8  CL 100 104  CO2 22 22  GLUCOSE 84 74  BUN 7* 6*  CREATININE 1.07 1.02  CALCIUM 8.2* 7.3*   PT/INR No results for input(s): LABPROT, INR in the last 72 hours. CMP     Component Value Date/Time   NA 134 (L) 03/26/2019 0256   NA 137 11/20/2017 0931   K 3.8 03/26/2019 0256   K 3.3 (L) 11/20/2017 0931   CL 104 03/26/2019 0256   CO2 22 03/26/2019 0256   CO2 27 11/20/2017 0931   GLUCOSE 74 03/26/2019 0256   GLUCOSE 99 11/20/2017 0931   BUN 6 (L) 03/26/2019 0256   BUN 16.6 11/20/2017 0931   CREATININE 1.02 03/26/2019 0256   CREATININE 1.02 12/01/2018 0920   CREATININE 0.99 12/02/2017 1249   CREATININE 1.2 11/20/2017 0931   CALCIUM 7.3 (L) 03/26/2019 0256   CALCIUM 8.9 11/20/2017 0931   PROT 8.1 03/25/2019 1833   PROT 7.5 11/20/2017 0931   ALBUMIN 3.3 (L) 03/25/2019 1833   ALBUMIN 3.1 (L) 11/20/2017 0931    AST 22 03/25/2019 1833   AST 16 12/01/2018 0920   AST 38 (H) 11/20/2017 0931   ALT 13 03/25/2019 1833   ALT 6 12/01/2018 0920   ALT 45 11/20/2017 0931   ALKPHOS 83 03/25/2019 1833   ALKPHOS 62 11/20/2017 0931   BILITOT 0.8 03/25/2019 1833   BILITOT 0.7 12/01/2018 0920   BILITOT 0.66 11/20/2017 0931   GFRNONAA >60 03/26/2019 0256   GFRNONAA >60 12/01/2018 0920   GFRNONAA 52 (L) 02/11/2017 1640   GFRAA >60 03/26/2019 0256   GFRAA >60 12/01/2018 0920   GFRAA 60 02/11/2017 1640   Lipase     Component Value Date/Time   LIPASE 20 03/25/2019 1833       Studies/Results: Ct Abdomen Pelvis W Contrast  Result Date: 03/25/2019 CLINICAL DATA:  Generalized abdominal pain and constipation. EXAM: CT ABDOMEN AND PELVIS WITH CONTRAST TECHNIQUE: Multidetector CT imaging of the abdomen and pelvis was performed using the standard protocol following bolus administration of intravenous contrast. CONTRAST:  144m OMNIPAQUE IOHEXOL 300 MG/ML  SOLN COMPARISON:  CT scans from December 02, 2018, December 29, 2018, and January 08, 2019. FINDINGS: Lower chest: No acute abnormality. Hepatobiliary: Gallbladder distention remains, similar since December 29, 2018. The liver  is normal. Portal vein is normal. There is prominence of the intra and extrahepatic biliary tree which is stable. Pancreas: Pancreatic duct dilatation remains measuring up to 5 mm, unchanged. The remainder of the pancreas is normal. Spleen: An enhancing nodule in the anterior spleen is stable and of doubtful significance. The spleen is otherwise normal. Adrenals/Urinary Tract: Adrenal glands are normal. No perinephric stranding, stones, or masses. No acute hydronephrosis identified. Probable stones in the left side of the bladder, unchanged. The bladder is otherwise normal. No ureteral stones are noted. Stomach/Bowel: The bowel pattern is complicated. There are both dilated and nondilated loops of bowel in the abdomen. A dilated loop of small bowel in  the right upper quadrant measures up to 6.3 cm today versus 5.5 cm December 02, 2018 6.5 cm December 29, 2018 and 6.5 cm January 08, 2019. A loop of bowel in the right side of the pelvis measures 5.7 cm today versus 4.3 cm previously. There is some fecal material within this loop of bowel which is just proximal to a right lower quadrant ostomy. A dilated loop of small bowel deep in the pelvis on series 3, image 65 is similar since December 29, 2018. Multiple nondilated loops of small bowel are seen in the left side of the abdomen. The patient has a left-sided ostomy. A peristomal hernia is identified. There is a loop of descending colon within the hernia which is fluid-filled and mildly prominent measuring 3.1 cm today versus 4 cm December 29, 2018. The loops of small bowel within the left-sided parastomal hernia are decompressed. There is soft tissue adjacent to the rectum which is similar since previous studies. This soft tissue appears to abut the prostate in the prostate appears to be deviated to the right, also a stable finding. There is mucosal enhancement associated with right abdominal loops of small bowel. Vascular/Lymphatic: The aorta and branching vessels are normal. Prominent and enlarged left periaortic nodes are stable. Reproductive: The prostate appears to be deviated to the right and abuts increased soft tissue in the right side of the pelvis which has been stable when compared to previous studies. Other: No abdominal wall hernia or abnormality. No abdominopelvic ascites. Musculoskeletal: Multiple sclerotic lesions in the bones, particularly the pelvis, are stable, consistent with history of prostate cancer. The right proximal femur is involved. IMPRESSION: 1. The pattern of bowel is complicated. Multiple dilated and nondilated loops of small bowel are identified throughout the abdomen and pelvis. The overall pattern is similar since December 02, 2018 but there has been some worsening in small bowel  dilatation since that time. The right pelvic loops, just prior to the right lower quadrant ostomy are mildly more prominent/dilated in the interval. The findings are consistent with a small-bowel obstruction, likely partial but significant. I suspect multiple transition points. Overall, the amount of dilatation is mildly worsened since December of 2019 but the overall pattern of dilatation is similar since that time. The dilated loop of colon in the left lower quadrant parastomal hernia remains fluid-filled but is less dilated in the interval. 2. There is mucosal enhancement associated with loops of small bowel in the right side of the abdomen, likely representing active Crohn's disease. 3. Persistent gallbladder distention. Persistent prominence of the intra and extrahepatic bile ducts. The findings are similar in the interval. Recommend correlation with labs. 4. Persistent pancreatic duct dilatation. 5. No bony metastatic disease. 6. There appears to be abnormal soft tissue in the right side of the pelvis, abutting the right side  of the prostate which is deviated to the right. This finding is stable. Electronically Signed   By: Dorise Bullion III M.D   On: 03/25/2019 23:25    Anti-infectives: Anti-infectives (From admission, onward)   None       Assessment/Plan Crohn's disease on immunosuppression Parastomal hernia Metastatic prostate cancer- on palliative treatment, not immediate threat to life. Ostomy in place Moderate protein calorie malnutrition Chronic pain   Partial small bowel obstruction -SBO protocol initiated despite no nausea or vomiting and output in his bag.  He does have some significantly dilated small bowel on his CT scan c/w probable chronic PSBO. -cont current care for now and will follow.  Would like to avoid surgical intervention if at all possible given surgical history, etc -possible active crohn's flare as well.  Cont meds, but may need GI to evaluate for need for  steroids etc if this is felt to be contributing to his partial obstructive symptoms.  FEN - NPO/NGT/IVFs VTE - Lovenox ID - none currently   LOS: 0 days    Henreitta Cea , [facility] 03/26/2019, 9:13 AM Pager: [phone]

## 2019-03-26 NOTE — Discharge Summary (Addendum)
[facility] Discharge Summary  Patient name: Daniel Howard Medical record number: [id number] Date of birth: 30-Apr-1956 Age: 63 y.o. Gender: male Date of Admission: 03/25/2019  Date of Discharge: 03/28/2019 Admitting Physician: Lind Covert, MD  Primary Care Provider: Alycia Rossetti, MD Consultants: General surgery  Indication for Hospitalization: partial acute on chronic SBO  Discharge Diagnoses/Problem List:  Chronic SBO Crohn's disease Stage 4 prostate cancer Anemia Hypocalcemia Insomnia  Disposition: home  Discharge Condition: stable, improved  Discharge Exam:  General: Lying in bed comfortably Cardiovascular: RRR, no m/r/g Respiratory: Clear to auscultation bilaterally, no increased work of breathing Abdomen: Ileostomy with soft brown stool, abdomen nontender Extremities: No pedal edema, moves all extremities spontaneously  Brief Hospital Course:  Daniel Howard was admitted on 03/26/19 4.abdominal pain that was found to be due to a partial small bowel obstruction in the setting of chronic SBO due to Crohn's disease.  General surgery was consulted, and the SBO protocol was followed.  An NG tube was placed and patient was made n.p.o.  Abdominal pain improved during the morning of 4/4.  Patient did not have nausea and had normal stool output, but SBO was diagnosed due to abdominal pain and CT abdomen showing a partial but significant SBO with increased dilatation of bowel loops compared to December 2019.  CT abdomen also showed evidence of possible Crohn's flare, but patient's pain improved without escalating treatment for Crohn's disease.  However, later on 4/4, patient's abdominal pain worsened and was accompanied by intermittent distension and vomiting.  The frequency of patient's morphine was increased.  NG tube was pulled on 4/5 after patient's abdominal pain and nausea improved overnight.  He was advanced to a clear liquid diet, which he  tolerated well.  On 4/6, his diet was advanced to a full liquids, and he continued to have no abdominal pain or nausea.  He was felt to be safe for discharge and was amenable to going home.  He was discharged on the afternoon of 4/6.  Issues for Follow Up:  1. Continue to ensure that patient is tolerating p.o. intake and does not have further symptoms of an SBO, since he is at risk for future obstruction.  Significant Procedures: None  Significant Labs and Imaging:  Recent Labs  Lab 03/26/19 0256 03/27/19 0314 03/28/19 0817  WBC 6.7 9.0 5.9  HGB 10.7* 11.4* 10.5*  HCT 33.5* 35.2* 31.8*  PLT 383 428* 362   Recent Labs  Lab 03/25/19 1833 03/26/19 0256 03/27/19 0314 03/28/19 0817  NA 135 134* 137 137  K 4.0 3.8 4.2 4.3  CL 100 104 108 109  CO2 22 22 17* 22  GLUCOSE 84 74 81 103*  BUN 7* 6* 6* <5*  CREATININE 1.07 1.02 1.13 0.85  CALCIUM 8.2* 7.3* 7.0* 6.9*  ALKPHOS 83  --   --   --   AST 22  --   --   --   ALT 13  --   --   --   ALBUMIN 3.3*  --   --   --     Ct Abdomen Pelvis W Contrast  Result Date: 03/25/2019 CLINICAL DATA:  Generalized abdominal pain and constipation. EXAM: CT ABDOMEN AND PELVIS WITH CONTRAST TECHNIQUE: Multidetector CT imaging of the abdomen and pelvis was performed using the standard protocol following bolus administration of intravenous contrast. CONTRAST:  11m OMNIPAQUE IOHEXOL 300 MG/ML  SOLN COMPARISON:  CT scans from December 02, 2018, December 29, 2018, and  January 08, 2019. FINDINGS: Lower chest: No acute abnormality. Hepatobiliary: Gallbladder distention remains, similar since December 29, 2018. The liver is normal. Portal vein is normal. There is prominence of the intra and extrahepatic biliary tree which is stable. Pancreas: Pancreatic duct dilatation remains measuring up to 5 mm, unchanged. The remainder of the pancreas is normal. Spleen: An enhancing nodule in the anterior spleen is stable and of doubtful significance. The spleen is otherwise  normal. Adrenals/Urinary Tract: Adrenal glands are normal. No perinephric stranding, stones, or masses. No acute hydronephrosis identified. Probable stones in the left side of the bladder, unchanged. The bladder is otherwise normal. No ureteral stones are noted. Stomach/Bowel: The bowel pattern is complicated. There are both dilated and nondilated loops of bowel in the abdomen. A dilated loop of small bowel in the right upper quadrant measures up to 6.3 cm today versus 5.5 cm December 02, 2018 6.5 cm December 29, 2018 and 6.5 cm January 08, 2019. A loop of bowel in the right side of the pelvis measures 5.7 cm today versus 4.3 cm previously. There is some fecal material within this loop of bowel which is just proximal to a right lower quadrant ostomy. A dilated loop of small bowel deep in the pelvis on series 3, image 65 is similar since December 29, 2018. Multiple nondilated loops of small bowel are seen in the left side of the abdomen. The patient has a left-sided ostomy. A peristomal hernia is identified. There is a loop of descending colon within the hernia which is fluid-filled and mildly prominent measuring 3.1 cm today versus 4 cm December 29, 2018. The loops of small bowel within the left-sided parastomal hernia are decompressed. There is soft tissue adjacent to the rectum which is similar since previous studies. This soft tissue appears to abut the prostate in the prostate appears to be deviated to the right, also a stable finding. There is mucosal enhancement associated with right abdominal loops of small bowel. Vascular/Lymphatic: The aorta and branching vessels are normal. Prominent and enlarged left periaortic nodes are stable. Reproductive: The prostate appears to be deviated to the right and abuts increased soft tissue in the right side of the pelvis which has been stable when compared to previous studies. Other: No abdominal wall hernia or abnormality. No abdominopelvic ascites. Musculoskeletal: Multiple  sclerotic lesions in the bones, particularly the pelvis, are stable, consistent with history of prostate cancer. The right proximal femur is involved. IMPRESSION: 1. The pattern of bowel is complicated. Multiple dilated and nondilated loops of small bowel are identified throughout the abdomen and pelvis. The overall pattern is similar since December 02, 2018 but there has been some worsening in small bowel dilatation since that time. The right pelvic loops, just prior to the right lower quadrant ostomy are mildly more prominent/dilated in the interval. The findings are consistent with a small-bowel obstruction, likely partial but significant. I suspect multiple transition points. Overall, the amount of dilatation is mildly worsened since December of 2019 but the overall pattern of dilatation is similar since that time. The dilated loop of colon in the left lower quadrant parastomal hernia remains fluid-filled but is less dilated in the interval. 2. There is mucosal enhancement associated with loops of small bowel in the right side of the abdomen, likely representing active Crohn's disease. 3. Persistent gallbladder distention. Persistent prominence of the intra and extrahepatic bile ducts. The findings are similar in the interval. Recommend correlation with labs. 4. Persistent pancreatic duct dilatation. 5. No bony metastatic  disease. 6. There appears to be abnormal soft tissue in the right side of the pelvis, abutting the right side of the prostate which is deviated to the right. This finding is stable. Electronically Signed   By: Dorise Bullion III M.D   On: 03/25/2019 23:25   Dg Abd Acute 2+v W 1v Chest  Result Date: 03/17/2019 CLINICAL DATA:  Acute generalized abdominal pain, history of Crohn's disease. EXAM: DG ABDOMEN ACUTE W/ 1V CHEST COMPARISON:  Radiograph of January 08, 2019. FINDINGS: There is no evidence of free intraperitoneal air. Mildly dilated small bowel loops are noted with air-fluid levels  concerning for distal small bowel obstruction. No colonic dilatation is noted. Phleboliths are noted in the pelvis. Heart size and mediastinal contours are within normal limits. Both lungs are clear. IMPRESSION: Mildly dilated small bowel loops are noted with air-fluid levels concerning for possible distal small bowel obstruction. No acute cardiopulmonary disease. Electronically Signed   By: Marijo Conception, M.D.   On: 03/17/2019 14:58   Dg Abd Portable 1v-small Bowel Obstruction Protocol-initial, 8 Hr Delay  Result Date: 03/26/2019 CLINICAL DATA:  Small bowel obstruction. EXAM: PORTABLE ABDOMEN - 1 VIEW COMPARISON:  March 26, 2019 FINDINGS: There is an NG tube terminating left upper quadrant, likely within the gastric body. There is high attenuation material adjacent to the NG tube consistent with injected or ingested contrast. Dilated contrast filled loops of small bowel are seen consistent with the patient's small-bowel obstruction. No other acute abnormalities. IMPRESSION: 1. Dilated contrast filled loops of small bowel consistent with continued small bowel obstruction. Electronically Signed   By: Dorise Bullion III M.D   On: 03/26/2019 19:41   Dg Abd Portable 1v-small Bowel Protocol-position Verification  Result Date: 03/26/2019 CLINICAL DATA:  Reason for exam: Encounter for imaging study to confirm nasogastric (NG) tube placement. Per pt no abdominal pain at the momentt. Pt has colostomy bag RLQ. Hx of crohn's disease, abnormal finding on biliary tract, spigelian hernia, duodenal ulcer, peristomal hernia, SOB. EXAM: PORTABLE ABDOMEN - 1 VIEW COMPARISON:  CT, 03/25/2019. FINDINGS: Nasogastric tube passes below the diaphragm, tip projecting in the distal stomach. Left mid to lower abdomen hernia containing loops of bowel is noted. There are no dilated air-filled loops of bowel. IMPRESSION: Well-positioned nasogastric tube. Electronically Signed   By: Lajean Manes M.D.   On: 03/26/2019 09:32      Results/Tests Pending at Time of Discharge: None  Discharge Medications:  Allergies as of 03/28/2019      Reactions   Penicillins Hives   Has patient had a PCN reaction causing immediate rash, facial/tongue/throat swelling, SOB or lightheadedness with hypotension: Yes Has patient had a PCN reaction causing severe rash involving mucus membranes or skin necrosis: No Has patient had a PCN reaction that required hospitalization: No Has patient had a PCN reaction occurring within the last 10 years: No If all of the above answers are "NO", then may proceed with Cephalosporin use.      Medication List    STOP taking these medications   doxycycline 100 MG capsule Commonly known as:  VIBRAMYCIN   sulfamethoxazole-trimethoprim 800-160 MG tablet Commonly known as:  BACTRIM DS,SEPTRA DS     TAKE these medications   Adalimumab 40 MG/0.8ML Pnkt Commonly known as:  Humira Pen Inject 1 Syringe into the skin once a week. What changed:    how much to take  when to take this   budesonide 3 MG 24 hr capsule Commonly known as:  ENTOCORT  EC TAKE 3 CAPSULES BY MOUTH DAILY.   CALCIUM 600-D PO Take 2 tablets by mouth daily.   Centrum Silver 50+Men Tabs Take 1 tablet by mouth daily with breakfast.   ferrous sulfate 325 (65 FE) MG tablet Take 325 mg by mouth daily with breakfast.   lidocaine-prilocaine cream Commonly known as:  EMLA Apply 1 application topically daily as needed (when accessing port).   mirtazapine 15 MG tablet Commonly known as:  REMERON TAKE 1 TABLET BY MOUTH AT BEDTIME   oxyCODONE-acetaminophen 5-325 MG tablet Commonly known as:  PERCOCET/ROXICET Take 1 tablet by mouth every 4 (four) hours as needed for moderate pain.   Proventil HFA 108 (90 Base) MCG/ACT inhaler Generic drug:  albuterol Inhale 2 puffs into the lungs every 6 (six) hours as needed for wheezing or shortness of breath.   SIMETHICONE PO Take 1-2 tablets by mouth as needed (gas/flatulence).    Xtandi 40 MG capsule Generic drug:  enzalutamide TAKE 4 CAPSULES (160 MG TOTAL) BY MOUTH DAILY. What changed:  See the new instructions.       Discharge Instructions: Please refer to Patient Instructions section of EMR for full details.  Patient was counseled important signs and symptoms that should prompt return to medical care, changes in medications, dietary instructions, activity restrictions, and follow up appointments.   Follow-Up Appointments:   Kathrene Alu, MD 03/28/2019, 5:40 PM PGY-2, [place]

## 2019-03-27 LAB — CBC
HCT: 35.2 % — ABNORMAL LOW (ref 39.0–52.0)
Hemoglobin: 11.4 g/dL — ABNORMAL LOW (ref 13.0–17.0)
MCH: 30.8 pg (ref 26.0–34.0)
MCHC: 32.4 g/dL (ref 30.0–36.0)
MCV: 95.1 fL (ref 80.0–100.0)
Platelets: 428 10*3/uL — ABNORMAL HIGH (ref 150–400)
RBC: 3.7 MIL/uL — ABNORMAL LOW (ref 4.22–5.81)
RDW: 13.5 % (ref 11.5–15.5)
WBC: 9 10*3/uL (ref 4.0–10.5)
nRBC: 0 % (ref 0.0–0.2)

## 2019-03-27 LAB — BASIC METABOLIC PANEL
Anion gap: 12 (ref 5–15)
BUN: 6 mg/dL — ABNORMAL LOW (ref 8–23)
CO2: 17 mmol/L — ABNORMAL LOW (ref 22–32)
Calcium: 7 mg/dL — ABNORMAL LOW (ref 8.9–10.3)
Chloride: 108 mmol/L (ref 98–111)
Creatinine, Ser: 1.13 mg/dL (ref 0.61–1.24)
GFR calc Af Amer: >60 mL/min (ref >60)
GFR calc non Af Amer: >60 mL/min (ref >60)
Glucose, Bld: 81 mg/dL (ref 70–99)
Potassium: 4.2 mmol/L (ref 3.5–5.1)
Sodium: 137 mmol/L (ref 135–145)

## 2019-03-27 NOTE — Progress Notes (Signed)
Patient ID: Daniel Howard, male   DOB: 1956-11-05, 63 y.o.   MRN: [id number]       Subjective: Feels much better.  No pain.  Emptied bag 4-5 times since admission.  Objective: Vital signs in last 24 hours: Temp:  [98.8 F (37.1 C)-99 F (37.2 C)] 98.8 F (37.1 C) (04/05 0520) Pulse Rate:  [81-97] 91 (04/05 0520) Resp:  [16-18] 16 (04/05 0520) BP: (122-134)/(75-85) 122/80 (04/05 0520) SpO2:  [95 %-100 %] 99 % (04/05 0520) Last BM Date: 03/25/19  Intake/Output from previous day: 04/04 0701 - 04/05 0700 In: 830 [I.V.:800; NG/GT:30] Out: 2000 [Urine:1450; Emesis/NG output:550] Intake/Output this shift: No intake/output data recorded.  PE: Abd: soft, NT, ND, +BS, liquid output in bag and completely full.  Stoma is viable and slightly prolapses.  Lab Results:  Recent Labs    03/26/19 0256 03/27/19 0314  WBC 6.7 9.0  HGB 10.7* 11.4*  HCT 33.5* 35.2*  PLT 383 428*   BMET Recent Labs    03/26/19 0256 03/27/19 0314  NA 134* 137  K 3.8 4.2  CL 104 108  CO2 22 17*  GLUCOSE 74 81  BUN 6* 6*  CREATININE 1.02 1.13  CALCIUM 7.3* 7.0*   PT/INR No results for input(s): LABPROT, INR in the last 72 hours. CMP     Component Value Date/Time   NA 137 03/27/2019 0314   NA 137 11/20/2017 0931   K 4.2 03/27/2019 0314   K 3.3 (L) 11/20/2017 0931   CL 108 03/27/2019 0314   CO2 17 (L) 03/27/2019 0314   CO2 27 11/20/2017 0931   GLUCOSE 81 03/27/2019 0314   GLUCOSE 99 11/20/2017 0931   BUN 6 (L) 03/27/2019 0314   BUN 16.6 11/20/2017 0931   CREATININE 1.13 03/27/2019 0314   CREATININE 1.02 12/01/2018 0920   CREATININE 0.99 12/02/2017 1249   CREATININE 1.2 11/20/2017 0931   CALCIUM 7.0 (L) 03/27/2019 0314   CALCIUM 8.9 11/20/2017 0931   PROT 8.1 03/25/2019 1833   PROT 7.5 11/20/2017 0931   ALBUMIN 3.3 (L) 03/25/2019 1833   ALBUMIN 3.1 (L) 11/20/2017 0931   AST 22 03/25/2019 1833   AST 16 12/01/2018 0920   AST 38 (H) 11/20/2017 0931   ALT 13 03/25/2019 1833   ALT 6  12/01/2018 0920   ALT 45 11/20/2017 0931   ALKPHOS 83 03/25/2019 1833   ALKPHOS 62 11/20/2017 0931   BILITOT 0.8 03/25/2019 1833   BILITOT 0.7 12/01/2018 0920   BILITOT 0.66 11/20/2017 0931   GFRNONAA >60 03/27/2019 0314   GFRNONAA >60 12/01/2018 0920   GFRNONAA 52 (L) 02/11/2017 1640   GFRAA >60 03/27/2019 0314   GFRAA >60 12/01/2018 0920   GFRAA 60 02/11/2017 1640   Lipase     Component Value Date/Time   LIPASE 20 03/25/2019 1833       Studies/Results: Ct Abdomen Pelvis W Contrast  Result Date: 03/25/2019 CLINICAL DATA:  Generalized abdominal pain and constipation. EXAM: CT ABDOMEN AND PELVIS WITH CONTRAST TECHNIQUE: Multidetector CT imaging of the abdomen and pelvis was performed using the standard protocol following bolus administration of intravenous contrast. CONTRAST:  1100m OMNIPAQUE IOHEXOL 300 MG/ML  SOLN COMPARISON:  CT scans from December 02, 2018, December 29, 2018, and January 08, 2019. FINDINGS: Lower chest: No acute abnormality. Hepatobiliary: Gallbladder distention remains, similar since December 29, 2018. The liver is normal. Portal vein is normal. There is prominence of the intra and extrahepatic biliary tree which is stable. Pancreas: Pancreatic duct  dilatation remains measuring up to 5 mm, unchanged. The remainder of the pancreas is normal. Spleen: An enhancing nodule in the anterior spleen is stable and of doubtful significance. The spleen is otherwise normal. Adrenals/Urinary Tract: Adrenal glands are normal. No perinephric stranding, stones, or masses. No acute hydronephrosis identified. Probable stones in the left side of the bladder, unchanged. The bladder is otherwise normal. No ureteral stones are noted. Stomach/Bowel: The bowel pattern is complicated. There are both dilated and nondilated loops of bowel in the abdomen. A dilated loop of small bowel in the right upper quadrant measures up to 6.3 cm today versus 5.5 cm December 02, 2018 6.5 cm December 29, 2018 and 6.5  cm January 08, 2019. A loop of bowel in the right side of the pelvis measures 5.7 cm today versus 4.3 cm previously. There is some fecal material within this loop of bowel which is just proximal to a right lower quadrant ostomy. A dilated loop of small bowel deep in the pelvis on series 3, image 65 is similar since December 29, 2018. Multiple nondilated loops of small bowel are seen in the left side of the abdomen. The patient has a left-sided ostomy. A peristomal hernia is identified. There is a loop of descending colon within the hernia which is fluid-filled and mildly prominent measuring 3.1 cm today versus 4 cm December 29, 2018. The loops of small bowel within the left-sided parastomal hernia are decompressed. There is soft tissue adjacent to the rectum which is similar since previous studies. This soft tissue appears to abut the prostate in the prostate appears to be deviated to the right, also a stable finding. There is mucosal enhancement associated with right abdominal loops of small bowel. Vascular/Lymphatic: The aorta and branching vessels are normal. Prominent and enlarged left periaortic nodes are stable. Reproductive: The prostate appears to be deviated to the right and abuts increased soft tissue in the right side of the pelvis which has been stable when compared to previous studies. Other: No abdominal wall hernia or abnormality. No abdominopelvic ascites. Musculoskeletal: Multiple sclerotic lesions in the bones, particularly the pelvis, are stable, consistent with history of prostate cancer. The right proximal femur is involved. IMPRESSION: 1. The pattern of bowel is complicated. Multiple dilated and nondilated loops of small bowel are identified throughout the abdomen and pelvis. The overall pattern is similar since December 02, 2018 but there has been some worsening in small bowel dilatation since that time. The right pelvic loops, just prior to the right lower quadrant ostomy are mildly more  prominent/dilated in the interval. The findings are consistent with a small-bowel obstruction, likely partial but significant. I suspect multiple transition points. Overall, the amount of dilatation is mildly worsened since December of 2019 but the overall pattern of dilatation is similar since that time. The dilated loop of colon in the left lower quadrant parastomal hernia remains fluid-filled but is less dilated in the interval. 2. There is mucosal enhancement associated with loops of small bowel in the right side of the abdomen, likely representing active Crohn's disease. 3. Persistent gallbladder distention. Persistent prominence of the intra and extrahepatic bile ducts. The findings are similar in the interval. Recommend correlation with labs. 4. Persistent pancreatic duct dilatation. 5. No bony metastatic disease. 6. There appears to be abnormal soft tissue in the right side of the pelvis, abutting the right side of the prostate which is deviated to the right. This finding is stable. Electronically Signed   By: Dorise Bullion III  M.D   On: 03/25/2019 23:25   Dg Abd Portable 1v-small Bowel Obstruction Protocol-initial, 8 Hr Delay  Result Date: 03/26/2019 CLINICAL DATA:  Small bowel obstruction. EXAM: PORTABLE ABDOMEN - 1 VIEW COMPARISON:  March 26, 2019 FINDINGS: There is an NG tube terminating left upper quadrant, likely within the gastric body. There is high attenuation material adjacent to the NG tube consistent with injected or ingested contrast. Dilated contrast filled loops of small bowel are seen consistent with the patient's small-bowel obstruction. No other acute abnormalities. IMPRESSION: 1. Dilated contrast filled loops of small bowel consistent with continued small bowel obstruction. Electronically Signed   By: Dorise Bullion III M.D   On: 03/26/2019 19:41   Dg Abd Portable 1v-small Bowel Protocol-position Verification  Result Date: 03/26/2019 CLINICAL DATA:  Reason for exam: Encounter for  imaging study to confirm nasogastric (NG) tube placement. Per pt no abdominal pain at the momentt. Pt has colostomy bag RLQ. Hx of crohn's disease, abnormal finding on biliary tract, spigelian hernia, duodenal ulcer, peristomal hernia, SOB. EXAM: PORTABLE ABDOMEN - 1 VIEW COMPARISON:  CT, 03/25/2019. FINDINGS: Nasogastric tube passes below the diaphragm, tip projecting in the distal stomach. Left mid to lower abdomen hernia containing loops of bowel is noted. There are no dilated air-filled loops of bowel. IMPRESSION: Well-positioned nasogastric tube. Electronically Signed   By: Lajean Manes M.D.   On: 03/26/2019 09:32    Anti-infectives: Anti-infectives (From admission, onward)   None       Assessment/Plan Crohn's disease on immunosuppression Parastomal hernia Metastatic prostate cancer- on palliative treatment, not immediate threat to life. Ostomy in place Moderate protein calorie malnutrition Chronic pain   Partial small bowel obstruction -SBO protocol initiated and shows contrast in his small bowel that is dilated; however he appears to have chronically dilated small bowel.  He has emptied his ileostomy 4-5 times since admission.  He is clearly not obstructed.   -DC NGT -clear liquids -mobilize and pulm toilet  FEN - CLD/IVFs VTE - Lovenox ID - none currently   LOS: 1 day    Henreitta Cea , [facility] 03/27/2019, 9:32 AM Pager: [phone]

## 2019-03-27 NOTE — Progress Notes (Signed)
Family Medicine Teaching Service Daily Progress Note Intern Pager: [phone]  Patient name: Daniel Howard Medical record number: [id number] Date of birth: Oct 18, 1956 Age: 63 y.o. Gender: male  Primary Care Provider: Alycia Rossetti, MD Consultants: general surgery Code Status: DNR  Pt Overview and Major Events to Date:  4/4- patient admitted, general surgery consulted, abdominal pain improving  Assessment and Plan:  Abdominal pain 2/2 acute on chronic SBO: Confirmed by abdominal CT scan. NG tube in place. Advanced to clear liquids by surgery today. Pain control with 4 mg morphine q2H, will need to be de-escalated as his symptoms improve. -NG tube placed this morning -Monitor patient's pain and colostomy output -Continue normal saline at 100 mL/h -Wean narcotic pain medication as able -Continue SBO protocol with n.p.o. status and NG on suction -Appreciate general surgery recommendations  Crohn's disease: Story of Crohn's disease with colectomy and ostomy.  Home medications include adalimumab 40 mg q. Wednesday, budesonide 9 mg daily.  CT abdomen consistent with acute Crohn's flare. -Since patient's abdominal pain is improving, we will continue to observe before starting a parenteral medication for a Crohn's flare -Administer Humira on Wednesday  Stage IV prostate cancer: Bone metastases have been visualized, and patient is being managed with androgen suppressive therapy.  Was initially on hospice care but is now only on palliative care.  PSA has responded to anti-androgen therapy. -Continue Xtandi 160 mg daily when no longer n.p.o.  H/o anemia- home meds: iron 384m daily. Hgb 11.9 on admission, 10.7 on 4/4. - hold home iron - CBC daily  Goals of care-patient had appointment with palliative 4/2- note in chart confirms DNR and has another appointment for 4/10 for further discussion of goals of care. He states that he does not want any interventions in the event of cardiac arrest  because he does not want a broken rib. - contact wife in am for goals of care clarification [phone] Protein calorie malnutrition- albumin 3.3 on admission.  Consistent with poor p.o. intake in the past 4 days per patient with likely chronic nutritional deficiency due to Crohn's disease.  Patient has multivitamin on home med list -Could consider nutrition consult -Continue Remeron 15 mg daily as a possible appetite booster when patient is no longer n.p.o.  Hypocalcemia- 8.2 on admission, corrected to 8.8. patient has calcium supplement on home med list. - consider restarting supplement when clinically appropriate  Insomnia- home meds: remeron - continue home med  FEN/GI: NPO with suction PPx: lovenox  Disposition: continue on med-surg  Subjective:  Abdominal pain has improved. NG tube draining. No acute events overnight. No vomiting. No BM.  Objective: Temp:  [98.8 F (37.1 C)-99 F (37.2 C)] 98.8 F (37.1 C) (04/05 0520) Pulse Rate:  [81-97] 91 (04/05 0520) Resp:  [16-18] 16 (04/05 0520) BP: (122-134)/(75-85) 122/80 (04/05 0520) SpO2:  [95 %-100 %] 99 % (04/05 0520) Physical Exam: General: Lying in bed comfortably, NG tube in place on suction Cardiovascular: RRR, no m/r/g Respiratory: CTA bil, no W/R/R Abdomen: very mildly diffusely tender. Colostomy output soft brown stool  Extremities: No pedal edema, moves all extremities spontaneously  Laboratory: Recent Labs  Lab 03/25/19 1833 03/26/19 0256 03/27/19 0314  WBC 10.7* 6.7 9.0  HGB 11.9* 10.7* 11.4*  HCT 37.8* 33.5* 35.2*  PLT 450* 383 428*   Recent Labs  Lab 03/25/19 1833 03/26/19 0256 03/27/19 0314  NA 135 134* 137  K 4.0 3.8 4.2  CL 100 104 108  CO2 22 22 17*  BUN 7* 6* 6*  CREATININE 1.07 1.02 1.13  CALCIUM 8.2* 7.3* 7.0*  PROT 8.1  --   --   BILITOT 0.8  --   --   ALKPHOS 83  --   --   ALT 13  --   --   AST 22  --   --   GLUCOSE 84 74 81    Imaging/Diagnostic Tests: Ct Abdomen Pelvis  W Contrast Result Date: 03/25/2019 FINDINGS: Lower chest: No acute abnormality  Hepatobiliary:  Gallbladder distention remains, similar since December 29, 2018. The liver is normal. Portal vein is normal. There is prominence of the intra and extrahepatic biliary tree which is stable.  Pancreas: Pancreatic duct dilatation remains measuring up to 5 mm, unchanged. The remainder of the pancreas is normal. Spleen: An enhancing nodule in the anterior spleen is stable and of doubtful significance. The spleen is otherwise normal.  Adrenals/Urinary Tract: Adrenal glands are normal. No perinephric stranding, stones, or masses. No acute hydronephrosis identified. Probable stones in the left side of the bladder, unchanged. The bladder is otherwise normal. No ureteral stones are noted.  Stomach/Bowel: The bowel pattern is complicated. There are both dilated and nondilated loops of bowel in the abdomen. A dilated loop of small bowel in the right upper quadrant measures up to 6.3 cm today versus 5.5 cm December 02, 2018 6.5 cm December 29, 2018 and 6.5 cm January 08, 2019. A loop of bowel in the right side of the pelvis measures 5.7 cm today versus 4.3 cm previously. There is some fecal material within this loop of bowel which is just proximal to a right lower quadrant ostomy. A dilated loop of small bowel deep in the pelvis on series 3, image 65 is similar since December 29, 2018. Multiple nondilated loops of small bowel are seen in the left side of the abdomen. The patient has a left-sided ostomy. A peristomal hernia is identified. There is a loop of descending colon within the hernia which is fluid-filled and mildly prominent measuring 3.1 cm today versus 4 cm December 29, 2018. The loops of small bowel within the left-sided parastomal hernia are decompressed. There is soft tissue adjacent to the rectum which is similar since previous studies. This soft tissue appears to abut the prostate in the prostate appears to be deviated to  the right, also a stable finding. There is mucosal enhancement associated with right abdominal loops of small bowel.  Vascular/Lymphatic: The aorta and branching vessels are normal. Prominent and enlarged left periaortic nodes are stable.  Reproductive: The prostate appears to be deviated to the right and abuts increased soft tissue in the right side of the pelvis which has been stable when compared to previous studies.  Other: No abdominal wall hernia or abnormality. No abdominopelvic ascites. Musculoskeletal: Multiple sclerotic lesions in the bones, particularly the pelvis, are stable, consistent with history of prostate cancer. The right proximal femur is involved.   IMPRESSION:  1. The pattern of bowel is complicated. Multiple dilated and nondilated loops of small bowel are identified throughout the abdomen and pelvis. The overall pattern is similar since December 02, 2018 but there has been some worsening in small bowel dilatation since that time. The right pelvic loops, just prior to the right lower quadrant ostomy are mildly more prominent/dilated in the interval. The findings are consistent with a small-bowel obstruction, likely partial but significant. I suspect multiple transition points. Overall, the amount of dilatation is mildly worsened since December of 2019 but the overall  pattern of dilatation is similar since that time. The dilated loop of colon in the left lower quadrant parastomal hernia remains fluid-filled but is less dilated in the interval.  2. There is mucosal enhancement associated with loops of small bowel in the right side of the abdomen, likely representing active Crohn's disease.  3. Persistent gallbladder distention. Persistent prominence of the intra and extrahepatic bile ducts. The findings are similar in the interval. Recommend correlation with labs.  4. Persistent pancreatic duct dilatation.  5. No bony metastatic disease. 6. There appears to be abnormal soft tissue in  the right side of the pelvis, abutting the right side of the prostate which is deviated to the right. This finding is stable.   Dg Abd Acute 2+v W 1v Chest 03/17/2019 FINDINGS: There is no evidence of free intraperitoneal air. Mildly dilated small bowel loops are noted with air-fluid levels concerning for distal small bowel obstruction. No colonic dilatation is noted. Phleboliths are noted in the pelvis. Heart size and mediastinal contours are within normal limits. Both lungs are clear.  IMPRESSION: Mildly dilated small bowel loops are noted with air-fluid levels concerning for possible distal small bowel obstruction. No acute cardiopulmonary disease.   Dg Abd Portable 1v-small Bowel Obstruction Protocol-initial, 8 Hr Delay Result Date: 03/26/2019 FINDINGS: There is an NG tube terminating left upper quadrant, likely within the gastric body. There is high attenuation material adjacent to the NG tube consistent with injected or ingested contrast. Dilated contrast filled loops of small bowel are seen consistent with the patient's small-bowel obstruction. No other acute abnormalities  IMPRESSION:  1. Dilated contrast filled loops of small bowel consistent with continued small bowel obstruction. 1   Dg Abd Portable 1v-small Bowel Protocol-position Verification Result Date: 03/26/2019  IMPRESSION: Well-positioned nasogastric tube.    Everrett Coombe, MD 03/27/2019, 10:44 AM PGY-2, [place] Intern pager: [phone], text pages welcome

## 2019-03-28 ENCOUNTER — Ambulatory Visit (HOSPITAL_COMMUNITY)
Admission: EM | Admit: 2019-03-28 | Discharge: 2019-03-28 | Disposition: A | Discharge status: Home / Self Care | Payer: Medicare Other | Attending: Family Medicine | Admitting: Family Medicine

## 2019-03-28 ENCOUNTER — Other Ambulatory Visit: Payer: Self-pay

## 2019-03-28 ENCOUNTER — Encounter (HOSPITAL_COMMUNITY): Payer: Self-pay

## 2019-03-28 DIAGNOSIS — B9789 Other viral agents as the cause of diseases classified elsewhere: Secondary | ICD-10-CM | POA: Diagnosis not present

## 2019-03-28 DIAGNOSIS — J069 Acute upper respiratory infection, unspecified: Secondary | ICD-10-CM | POA: Diagnosis not present

## 2019-03-28 LAB — BASIC METABOLIC PANEL
Anion gap: 6 (ref 5–15)
BUN: <5 mg/dL — ABNORMAL LOW (ref 8–23)
CO2: 22 mmol/L (ref 22–32)
Calcium: 6.9 mg/dL — ABNORMAL LOW (ref 8.9–10.3)
Chloride: 109 mmol/L (ref 98–111)
Creatinine, Ser: 0.85 mg/dL (ref 0.61–1.24)
GFR calc Af Amer: >60 mL/min (ref >60)
GFR calc non Af Amer: >60 mL/min (ref >60)
Glucose, Bld: 103 mg/dL — ABNORMAL HIGH (ref 70–99)
Potassium: 4.3 mmol/L (ref 3.5–5.1)
Sodium: 137 mmol/L (ref 135–145)

## 2019-03-28 LAB — CBC
HCT: 31.8 % — ABNORMAL LOW (ref 39.0–52.0)
Hemoglobin: 10.5 g/dL — ABNORMAL LOW (ref 13.0–17.0)
MCH: 31.1 pg (ref 26.0–34.0)
MCHC: 33 g/dL (ref 30.0–36.0)
MCV: 94.1 fL (ref 80.0–100.0)
Platelets: 362 10*3/uL (ref 150–400)
RBC: 3.38 MIL/uL — ABNORMAL LOW (ref 4.22–5.81)
RDW: 13.8 % (ref 11.5–15.5)
WBC: 5.9 10*3/uL (ref 4.0–10.5)
nRBC: 0 % (ref 0.0–0.2)

## 2019-03-28 MED ORDER — DM-GUAIFENESIN ER 30-600 MG PO TB12: 1.0000 | ORAL_TABLET | Freq: Two times a day (BID) | ORAL | 0 refills | Status: DC

## 2019-03-28 MED ORDER — BENZONATATE 200 MG PO CAPS: 200.0000 mg | ORAL_CAPSULE | Freq: Two times a day (BID) | ORAL | 0 refills | Status: DC | PRN

## 2019-03-28 MED ORDER — MORPHINE SULFATE (PF) 4 MG/ML IV SOLN: 4.0000 mg | INTRAVENOUS | Status: DC | PRN

## 2019-03-28 NOTE — Discharge Instructions (Signed)
Take Tessalon 2 times a day with Mucinex 1 pill 2 times a day.  This is for cough and congestion Drink plenty of fluids Use a humidifier if you have one Continue to stay home and isolate yourself from other people Call your primary care doctor at any time if you are worse instead of better

## 2019-03-28 NOTE — Progress Notes (Signed)
Family Medicine Teaching Service Daily Progress Note Intern Pager: [phone]  Patient name: Daniel Howard Medical record number: [id number] Date of birth: March 11, 1956 Age: 63 y.o. Gender: male  Primary Care Provider: Alycia Rossetti, MD Consultants: general surgery Code Status: DNR  Pt Overview and Major Events to Date:  4/4- patient admitted, general surgery consulted, abdominal pain improving initially then worsening with N/V 4/5-no further N/V, abdominal pain improved, tolerated clear liquid diet  Assessment and Plan:  Acute on chronic SBO, resolving: Confirmed by abdominal CT scan.  Tolerated clear liquid diet yesterday with improvement in pain and nausea and vomiting.  Continues to have good ileostomy output.  Last PRN morphine was at 1500 on 4/5. -Advance to full liquid diet and DC today if patient tolerates this per surgery -Stop normal saline at 100 mL/h -stop pain medication -Appreciate general surgery recommendations  Crohn's disease: Story of Crohn's disease with colectomy and ostomy.  Home medications include adalimumab 40 mg q. Wednesday, budesonide 9 mg daily.  CT abdomen consistent with acute Crohn's flare, although patient's clinical picture is more consistent with SBO. -Restart budesonide since patient can now take PO medications -Administer Humira on Wednesday  Stage IV prostate cancer: Bone metastases have been visualized, and patient is being managed with androgen suppressive therapy.  Was initially on hospice care but is now only on palliative care.  PSA has responded to anti-androgen therapy. -Continue Xtandi 160 mg daily   H/o anemia- home meds: iron 374m daily. Hgb 11.9 on admission, 10.7 on 4/4. - hold home iron - CBC daily  Goals of care-patient had appointment with palliative 4/2- note in chart confirms DNR and has another appointment for 4/10 for further discussion of goals of care. He states that he does not want any interventions in the event of  cardiac arrest because he does not want a broken rib.  Protein calorie malnutrition- albumin 3.3 on admission.  Consistent with poor p.o. intake in the past 4 days per patient with likely chronic nutritional deficiency due to Crohn's disease.  Patient has multivitamin on home med list -Could consider nutrition consult -Continue Remeron 15 mg daily as a possible appetite booster when patient is no longer n.p.o.  Hypocalcemia- 8.2 on admission, corrected to 8.8. patient has calcium supplement on home med list. - consider restarting supplement when clinically appropriate  Insomnia- home meds: remeron - continue home med  FEN/GI: full liquid diet PPx: lovenox  Disposition: continue on med-surg  Subjective:  Patient denies abdominal pain, nausea.  Emptying ostomy bag frequently.  Has tolerated clear liquids without problem.  Objective: Temp:  [97.9 F (36.6 C)-98.6 F (37 C)] 97.9 F (36.6 C) (04/06 0433) Pulse Rate:  [76-84] 76 (04/06 0433) Resp:  [16-18] 17 (04/06 0433) BP: (116-128)/(71-80) 116/71 (04/06 0433) SpO2:  [98 %-100 %] 100 % (04/06 0433) Physical Exam: General: Lying in bed comfortably Cardiovascular: RRR, no m/r/g Respiratory: Clear to auscultation bilaterally, no increased work of breathing Abdomen: Ileostomy with soft brown stool, abdomen nontender Extremities: No pedal edema, moves all extremities spontaneously  Laboratory: Recent Labs  Lab 03/26/19 0256 03/27/19 0314 03/28/19 0817  WBC 6.7 9.0 5.9  HGB 10.7* 11.4* 10.5*  HCT 33.5* 35.2* 31.8*  PLT 383 428* 362   Recent Labs  Lab 03/25/19 1833 03/26/19 0256 03/27/19 0314  NA 135 134* 137  K 4.0 3.8 4.2  CL 100 104 108  CO2 22 22 17*  BUN 7* 6* 6*  CREATININE 1.07 1.02 1.13  CALCIUM 8.2*  7.3* 7.0*  PROT 8.1  --   --   BILITOT 0.8  --   --   ALKPHOS 83  --   --   ALT 13  --   --   AST 22  --   --   GLUCOSE 84 74 81    Imaging/Diagnostic Tests: Ct Abdomen Pelvis W Contrast Result Date:  03/25/2019 FINDINGS: Lower chest: No acute abnormality  Hepatobiliary:  Gallbladder distention remains, similar since December 29, 2018. The liver is normal. Portal vein is normal. There is prominence of the intra and extrahepatic biliary tree which is stable.  Pancreas: Pancreatic duct dilatation remains measuring up to 5 mm, unchanged. The remainder of the pancreas is normal. Spleen: An enhancing nodule in the anterior spleen is stable and of doubtful significance. The spleen is otherwise normal.  Adrenals/Urinary Tract: Adrenal glands are normal. No perinephric stranding, stones, or masses. No acute hydronephrosis identified. Probable stones in the left side of the bladder, unchanged. The bladder is otherwise normal. No ureteral stones are noted.  Stomach/Bowel: The bowel pattern is complicated. There are both dilated and nondilated loops of bowel in the abdomen. A dilated loop of small bowel in the right upper quadrant measures up to 6.3 cm today versus 5.5 cm December 02, 2018 6.5 cm December 29, 2018 and 6.5 cm January 08, 2019. A loop of bowel in the right side of the pelvis measures 5.7 cm today versus 4.3 cm previously. There is some fecal material within this loop of bowel which is just proximal to a right lower quadrant ostomy. A dilated loop of small bowel deep in the pelvis on series 3, image 65 is similar since December 29, 2018. Multiple nondilated loops of small bowel are seen in the left side of the abdomen. The patient has a left-sided ostomy. A peristomal hernia is identified. There is a loop of descending colon within the hernia which is fluid-filled and mildly prominent measuring 3.1 cm today versus 4 cm December 29, 2018. The loops of small bowel within the left-sided parastomal hernia are decompressed. There is soft tissue adjacent to the rectum which is similar since previous studies. This soft tissue appears to abut the prostate in the prostate appears to be deviated to the right, also a stable  finding. There is mucosal enhancement associated with right abdominal loops of small bowel.  Vascular/Lymphatic: The aorta and branching vessels are normal. Prominent and enlarged left periaortic nodes are stable.  Reproductive: The prostate appears to be deviated to the right and abuts increased soft tissue in the right side of the pelvis which has been stable when compared to previous studies.  Other: No abdominal wall hernia or abnormality. No abdominopelvic ascites. Musculoskeletal: Multiple sclerotic lesions in the bones, particularly the pelvis, are stable, consistent with history of prostate cancer. The right proximal femur is involved.   IMPRESSION:  1. The pattern of bowel is complicated. Multiple dilated and nondilated loops of small bowel are identified throughout the abdomen and pelvis. The overall pattern is similar since December 02, 2018 but there has been some worsening in small bowel dilatation since that time. The right pelvic loops, just prior to the right lower quadrant ostomy are mildly more prominent/dilated in the interval. The findings are consistent with a small-bowel obstruction, likely partial but significant. I suspect multiple transition points. Overall, the amount of dilatation is mildly worsened since December of 2019 but the overall pattern of dilatation is similar since that time. The dilated loop of  colon in the left lower quadrant parastomal hernia remains fluid-filled but is less dilated in the interval.  2. There is mucosal enhancement associated with loops of small bowel in the right side of the abdomen, likely representing active Crohn's disease.  3. Persistent gallbladder distention. Persistent prominence of the intra and extrahepatic bile ducts. The findings are similar in the interval. Recommend correlation with labs.  4. Persistent pancreatic duct dilatation.  5. No bony metastatic disease. 6. There appears to be abnormal soft tissue in the right side of the  pelvis, abutting the right side of the prostate which is deviated to the right. This finding is stable.   Dg Abd Acute 2+v W 1v Chest 03/17/2019 FINDINGS: There is no evidence of free intraperitoneal air. Mildly dilated small bowel loops are noted with air-fluid levels concerning for distal small bowel obstruction. No colonic dilatation is noted. Phleboliths are noted in the pelvis. Heart size and mediastinal contours are within normal limits. Both lungs are clear.  IMPRESSION: Mildly dilated small bowel loops are noted with air-fluid levels concerning for possible distal small bowel obstruction. No acute cardiopulmonary disease.   Dg Abd Portable 1v-small Bowel Obstruction Protocol-initial, 8 Hr Delay Result Date: 03/26/2019 FINDINGS: There is an NG tube terminating left upper quadrant, likely within the gastric body. There is high attenuation material adjacent to the NG tube consistent with injected or ingested contrast. Dilated contrast filled loops of small bowel are seen consistent with the patient's small-bowel obstruction. No other acute abnormalities  IMPRESSION:  1. Dilated contrast filled loops of small bowel consistent with continued small bowel obstruction. 1   Dg Abd Portable 1v-small Bowel Protocol-position Verification Result Date: 03/26/2019  IMPRESSION: Well-positioned nasogastric tube.    Kathrene Alu, MD 03/28/2019, 8:56 AM PGY-2, [place] Intern pager: [phone], text pages welcome

## 2019-03-28 NOTE — Plan of Care (Signed)
  Problem: Pain Managment: Goal: General experience of comfort will improve Outcome: Progressing   Problem: Safety: Goal: Ability to remain free from injury will improve Outcome: Progressing   Problem: Skin Integrity: Goal: Risk for impaired skin integrity will decrease Outcome: Progressing   

## 2019-03-28 NOTE — ED Triage Notes (Signed)
Pt cc deep cough x 4 days and thick mucus yellowish.

## 2019-03-28 NOTE — Progress Notes (Signed)
Pt for DC to home, wife can help care for him.  DC instructions given and explained.  Meds to be obtained back from pharmacy.

## 2019-03-28 NOTE — ED Provider Notes (Signed)
[facility]    CSN: [id number] Arrival date & time: 03/28/19  1752     History   Chief Complaint Chief Complaint  Patient presents with  . Cough    HPI Daniel Howard is a 63 y.o. male.   HPI   Mr. Ericsson has multiple medical problems including a colostomy from multiple abdominal surgeries from severe Crohn's disease.  He is on Humira and chronic Entocort.  He states that his bowels are fairly stable right now but he was hospitalized l several days ago for bowel obstruction and is still on a soft diet.  States his weight is stable. He has metastatic prostate cancer to his bone.  It is not being treated anymore.  He does have oxycodone to take when he has pain. He is immune compromised and he is aware of this.  He states he has been trying to stay away from people because of the COVID-19 pandemic.  He has 4 boys at home who refused to be contained, and he states they do come in and out of the house.  He is good about wearing a mask when he leaves the house. Patient is here for cough.  He has been coughing for 4 days.  He states he has a harsh cough with a lot of sputum production.  He thinks it is "bronchitis".  No runny or stuffy nose.  No fever or chills.  No shortness of breath.  No underlying asthma or lung disease.  He is never used an inhaler.  Did not call his physician prior to coming in.  He is reminded to call and his physician will sometimes allow him to be treated without having to leave the home, which is safer for him at this time.  Past Medical History:  Diagnosis Date  . Abnormal finding of biliary tract    MRCP shows pancreatic/biliary tract dilation. EUS 2010 confirmed dilation but no chronic pancreaitis or mass. Vascular ectasia crimpoing distal CBD.   [name] Anxiety   . 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 Duodenal ulcer 2010   nsaids  . History of blood transfusion 1992   "related to colon OR"  . Peristomal hernia   . Prostate cancer ([place]) 2018  . SBO (small bowel obstruction) ([place]) 11/10/2018  . Small bowel obstruction ([place]) 10/2017; 11/21/2017; 02/16/2018  . Spigelian hernia    bilateral    Patient Active Problem List   Diagnosis Date Noted  . Incarcerated hernia 01/09/2019  . Palliative care encounter   . Small bowel obstruction due to adhesions ([place]) 12/29/2018  . Small bowel obstruction ([place]) 12/03/2018  . Partial small bowel obstruction ([place]) 11/01/2018  . SBO (small bowel obstruction) ([place]) 09/30/2018  . Iron deficiency anemia 07/03/2018  . 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 Nausea without vomiting 11/10/2017  . Incisional hernia 09/22/2017  . Abdominal pain of multiple sites 09/22/2017  . Goals of care, counseling/discussion 04/02/2017  . Prostate cancer metastatic to bone ([place]) 04/02/2017  . Elevated PSA 03/04/2017  . Osteopenia 11/03/2016  . Pelvic mass in male 09/04/2016  . 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 Exacerbation of Crohn's disease ([place]) 09/04/2016  . Family history of colon cancer  10/12/2014  . Protein-calorie malnutrition, severe ([place]) 07/27/2014  . Loss of weight 06/09/2014  . Crohn's disease of both small and large intestine with complication ([place]) 39/76/7341  . Boils 02/11/2013  . Ventral hernia 12/11/2009  . Anemia 12/03/2009  . Regional enteritis/Crohn's 01/25/2007    Past Surgical History:  Procedure Laterality Date  . APPENDECTOMY  1992   at time of exp laparotomy at which time he was noted to have fistulizing Crohn's rather than UC  . COLON SURGERY    . COLONOSCOPY [name] 08/23/2014   [id number] proctoscopy with possible fistulous opening in thebase of  rectal/anal stump.    . COLOSTOMY  1992   transverse loop colostomy secondary to a stricture  . ESOPHAGOGASTRODUODENOSCOPY  05/2009   SLF: multiple antral erosions, large ulcer at ansatomosis (postsurgical changes at duodenal bulb and second portion of duodenum) BX c/x NSAIDS.  . EUS  10/04/2009   Dr. Estill Bakes dilated CBD and main pancreatic duct.  No pancreatic  . EXPLORATORY LAPAROTOMY  1992  . FLEXIBLE SIGMOIDOSCOPY  1988   Dr. Laural Golden- suggested rohn's disease but the biopsies were not collaborative.  [name] FLEXIBLE SIGMOIDOSCOPY [name] 09/08/2016   Procedure: FLEXIBLE SIGMOIDOSCOPY;  Surgeon: Wonda Horner, MD;  Location: [facility] ENDOSCOPY;  Service: Gastroenterology;  Laterality: [name];  . HEMICOLOECTOMY W/ ANASTOMOSIS  1993   R- Dr.DeMason   . HERNIA REPAIR  1996   incarcerated periostial hernia with additional surgery in 1999  . IR FLUORO GUIDE PORT INSERTION RIGHT  04/03/2017  . IR US GUIDE VASC ACCESS RIGHT  04/03/2017       Home Medications    Prior to Admission medications   Medication Sig Start Date End Date Taking? Authorizing Provider  Adalimumab (HUMIRA PEN) 40 MG/0.8ML PNKT Inject 1 Syringe into the skin once a week. Patient taking differently: Inject 40 mg into the skin every Wednesday.  09/01/18   Annitta Needs, NP  albuterol (PROVENTIL HFA) 108 (90 Base) MCG/ACT inhaler Inhale 2 puffs into the lungs every 6 (six) hours as needed for wheezing or shortness of breath.    [provider]  benzonatate (TESSALON) 200 MG capsule Take 1 capsule (200 mg total) by mouth 2 (two) times daily as needed for cough. 03/28/19   Raylene Everts, MD  budesonide (ENTOCORT EC) 3 MG 24 hr capsule TAKE 3 CAPSULES BY MOUTH DAILY. Patient taking differently: Take 9 mg by mouth daily.  02/07/19   Carlis Stable, NP  Calcium Carb-Cholecalciferol (CALCIUM 600-D PO) Take 2 tablets by mouth daily.    [provider]  dextromethorphan-guaiFENesin (MUCINEX DM) 30-600 MG 12hr tablet Take 1  tablet by mouth 2 (two) times daily. 03/28/19   Raylene Everts, MD  ferrous sulfate 325 (65 FE) MG tablet Take 325 mg by mouth daily with breakfast.    [provider]  lidocaine-prilocaine (EMLA) cream Apply 1 application topically daily as needed (when accessing port).     [provider]  mirtazapine (REMERON) 15 MG tablet TAKE 1 TABLET BY MOUTH AT BEDTIME Patient taking differently: Take 15 mg by mouth at bedtime.  03/21/19   [place], [name], MD  Multiple Vitamins-Minerals (CENTRUM SILVER 50+MEN) TABS Take 1 tablet by mouth daily with breakfast.    [provider]  oxyCODONE-acetaminophen (PERCOCET/ROXICET) 5-325 MG tablet Take 1 tablet by mouth every 4 (four) hours as needed for moderate pain. 03/23/19   Alycia Rossetti, MD  SIMETHICONE PO Take 1-2 tablets by mouth as needed (gas/flatulence).  [provider]  XTANDI 40 MG capsule TAKE 4 CAPSULES (160 MG TOTAL) BY MOUTH DAILY. Patient taking differently: Take 160 mg by mouth daily.  03/11/19   [name], MD    Family History Family History  Problem Relation Age of Onset  . Cancer Father        prostate   . Prostate cancer Father   . Colon cancer Father 75  . Hypertension Sister   . Cancer Sister   . Depression Sister   . Breast cancer Sister   . COPD Sister   . Aneurysm Brother        deceased, brain aneurysm    Social History Social History   Tobacco Use  . Smoking status: Former Smoker    Packs/day: 0.75    Years: 32.00    Pack years: 24.00    Types: Cigarettes    Last attempt to quit: 12/21/2009    Years since quitting: 9.2  . Smokeless tobacco: Never Used  Substance Use Topics  . Alcohol use: No    Alcohol/week: 0.0 standard drinks    Comment: Former drinker  . Drug use: No     Allergies   Penicillins   Review of Systems Review of Systems  Constitutional: Positive for appetite change. Negative for chills, fatigue, fever and unexpected weight change.        Eating less because of recent small bowel obstruction  HENT: Negative for congestion, ear pain and sore throat.   Eyes: Negative for pain and visual disturbance.  Respiratory: Positive for cough. Negative for shortness of breath.   Cardiovascular: Negative for chest pain and palpitations.  Gastrointestinal: Negative for abdominal pain and vomiting.  Genitourinary: Negative for dysuria and hematuria.  Musculoskeletal: Negative for arthralgias and back pain.  Skin: Negative for color change and rash.  Neurological: Negative for seizures and syncope.  All other systems reviewed and are negative.    Physical Exam Triage Vital Signs ED Triage Vitals  Enc Vitals Group     BP 03/28/19 1806 117/75     Pulse Rate 03/28/19 1806 90     Resp 03/28/19 1806 18     Temp 03/28/19 1806 98.8 F (37.1 C)     Temp Source 03/28/19 1806 Oral     SpO2 03/28/19 1806 100 %     Weight 03/28/19 1808 123 lb (55.8 kg)   No data found.  Updated Vital Signs BP 117/75 (BP Location: Right Arm)   Pulse 90   Temp 98.8 F (37.1 C) (Oral)   Resp 18   Wt 55.8 kg   SpO2 100%   BMI 20.79 kg/m       Physical Exam Constitutional:      General: He is not in acute distress.    Appearance: He is well-developed and normal weight.  HENT:     Head: Normocephalic and atraumatic.     Right Ear: Tympanic membrane and ear canal normal.     Left Ear: Tympanic membrane and ear canal normal.     Nose: Nose normal.     Mouth/Throat:     Mouth: Mucous membranes are moist.     Pharynx: No posterior oropharyngeal erythema.  Eyes:     Conjunctiva/sclera: Conjunctivae normal.     Pupils: Pupils are equal, round, and reactive to light.  Neck:     Musculoskeletal: Normal range of motion.  Cardiovascular:     Rate and Rhythm: Normal rate and regular rhythm.     Heart  sounds: Normal heart sounds.  Pulmonary:     Effort: Pulmonary effort is normal. No respiratory distress.     Breath sounds: Normal breath sounds. No  wheezing or rhonchi.  Abdominal:     General: Abdomen is flat. There is no distension.     Palpations: Abdomen is soft.     Comments: Abdomen is soft.  Riddled with scars.  All are well-healed.  Colostomy is in the right lower quadrant with soft brown stool.  There is no tenderness.  Musculoskeletal: Normal range of motion.  Skin:    General: Skin is warm and dry.  Neurological:     Mental Status: He is alert.  Psychiatric:        Mood and Affect: Mood normal.        Behavior: Behavior normal.      UC Treatments / Results  Labs (all labs ordered are listed, but only abnormal results are displayed) Labs Reviewed - No data to display  EKG None  Radiology None  Procedures Procedures (including critical care time)  Medications Ordered in UC Medications - No data to display  Initial Impression / Assessment and Plan / UC Course  I have reviewed the triage vital signs and the nursing notes.  Pertinent labs & imaging results that were available during my care of the patient were reviewed by me and considered in my medical decision making (see chart for details).     I discussed with patient that his lung exam is normal.  He has a viral respiratory infection.  It is impossible for me to say with certainty whether this is the cold virus, influenza virus, or the COVID-19 virus.  He does not have any fever or severe symptoms, I feel it is safe and stable for him to go home.  He is reminded to isolate.  He is told to call his doctor for any questions or problems.  He is told to call 911 if he has any acute shortness of breath or worsening symptoms. Final Clinical Impressions(s) / UC Diagnoses   Final diagnoses:  Viral URI with cough     Discharge Instructions     Take Tessalon 2 times a day with Mucinex 1 pill 2 times a day.  This is for cough and congestion Drink plenty of fluids Use a humidifier if you have one Continue to stay home and isolate yourself from other people  Call your primary care doctor at any time if you are worse instead of better   ED Prescriptions    Medication Sig Dispense Auth. Provider   benzonatate (TESSALON) 200 MG capsule Take 1 capsule (200 mg total) by mouth 2 (two) times daily as needed for cough. 20 capsule Raylene Everts, MD   dextromethorphan-guaiFENesin [facility] DM) 30-600 MG 12hr tablet Take 1 tablet by mouth 2 (two) times daily. 20 tablet Raylene Everts, MD     Controlled Substance Prescriptions [place] Controlled Substance Registry consulted? Not Applicable   Raylene Everts, MD 03/28/19 [id number]

## 2019-03-28 NOTE — Consult Note (Signed)
   [facility] CM Inpatient Consult   03/28/2019  JASN XIA Jan 17, 1956 [id number]   Patient screened for extreme high risk score of [45%] for unplanned readmissions and 6 hospitalizations 3 ED visits in the past 6 months.  Chart reviewed for potential [place] Management community follow up needs.   Patient was hospitalized for small bowel obstruction. Primary Care Provider is Vic Blackbird, MD.  Transition of Care team memver notes review reveals patient is active with [facility] for palliative care. No current community needs noted for [facility] Management services for this member with [employer].   Please place a [facility] Care Management consult or for questions contact:   Natividad Brood, RN BSN [facility] Liaison  [phone] business mobile phone Toll free office [phone]

## 2019-03-28 NOTE — TOC Initial Note (Signed)
Transition of Care [facility]) - Initial/Assessment Note    Patient Details  Name: Daniel Howard MRN: [id number] Date of Birth: 1956/01/03  Transition of Care [facility]) CM/SW Contact:    Marilu Favre, RN Phone Number: 03/28/2019, 11:10 AM  Clinical Narrative:                 Patient from home with wife Daniel Howard and Palliative Care through [facility]. Called Erline Levine B with [facility] and left message of patient's admission.   Patient has PCP DR Vic Blackbird and transportation to appointments . Has prescription coverage .   Patient has hospital bed and 3 in 1 at home.   Will continue to follow.   Expected Discharge Plan: Home/Self Care(Palliative Care through [facility] ) Barriers to Discharge: Continued Medical Work up   Patient Goals and CMS Choice Patient states their goals for this hospitalization and ongoing recovery are:: To go home  CMS Medicare.gov Compare Post Acute Care list provided to:: Patient Choice offered to / list presented to : Patient  Expected Discharge Plan and Services Expected Discharge Plan: Home/Self Care(Palliative Care through [facility] )   Discharge Planning Services: CM Consult   Living arrangements for the past 2 months: Single Family Home                 DME Arranged: [name] DME Agency: NA      Prior Living Arrangements/Services Living arrangements for the past 2 months: Single Family Home Lives with:: Spouse Patient language and need for interpreter reviewed:: No Do you feel safe going back to the place where you live?: Yes      Need for Family Participation in Patient Care: Yes (Comment) Care giver support system in place?: Yes (comment) Current home services: (Has hospital bed, 3 in 1 , and Palliative Care through [facility] ) Criminal Activity/Legal Involvement Pertinent to Current Situation/Hospitalization: No - Comment as needed  Activities of Daily Living Home Assistive Devices/Equipment: Ostomy supplies ADL Screening  (condition at time of admission) Patient's cognitive ability adequate to safely complete daily activities?: Yes Is the patient deaf or have difficulty hearing?: No Does the patient have difficulty seeing, even when wearing glasses/contacts?: No Does the patient have difficulty concentrating, remembering, or making decisions?: No Patient able to express need for assistance with ADLs?: Yes Does the patient have difficulty dressing or bathing?: No Independently performs ADLs?: Yes (appropriate for developmental age) Does the patient have difficulty walking or climbing stairs?: No Weakness of Legs: None Weakness of Arms/Hands: None  Permission Sought/Granted Permission sought to share information with : Case Manager, Family Supports Permission granted to share information with : Yes, Verbal Permission Granted  Share Information with NAME: Daniel Howard wife   Permission granted to share info w AGENCY: [place]         Emotional Assessment Appearance:: Appears stated age Attitude/Demeanor/Rapport: Engaged Affect (typically observed): Accepting Orientation: : Oriented to Self, Oriented to Situation, Oriented to Place, Oriented to  Time   Psych Involvement: No (comment)  Admission diagnosis:  Small bowel obstruction ([place]) [K56.609] Patient Active Problem List   Diagnosis Date Noted  . Incarcerated hernia 01/09/2019  . Palliative care encounter   . Small bowel obstruction due to adhesions ([place]) 12/29/2018  . Small bowel obstruction ([place]) 12/03/2018  . Partial small bowel obstruction ([place]) 11/01/2018  . SBO (small bowel obstruction) ([place]) 09/30/2018  . Iron deficiency anemia 07/03/2018  . Colostomy status ([place]) 07/03/2018  . Nausea without vomiting 11/10/2017  .  Incisional hernia 09/22/2017  . Abdominal pain of multiple sites 09/22/2017  . Goals of care, counseling/discussion 04/02/2017  . Prostate cancer metastatic to bone ([place]) 04/02/2017  . Elevated PSA 03/04/2017  .  Osteopenia 11/03/2016  . Pelvic mass in male 09/04/2016  . 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 Exacerbation of Crohn's disease ([place]) 09/04/2016  . Family history of colon cancer 10/12/2014  . Protein-calorie malnutrition, severe ([place]) 07/27/2014  . Loss of weight 06/09/2014  . Crohn's disease of both small and large intestine with complication ([place]) 70/92/9574  . Boils 02/11/2013  . Ventral hernia 12/11/2009  . Anemia 12/03/2009  . Regional enteritis/Crohn's 01/25/2007   PCP:  Alycia Rossetti, MD Pharmacy:   [id number] [id number]: [phone]: [phone] [facility] [address]Phone: [phone]: [phone]Phone: [phone]: [phone]    Social Determinants of Health (SDOH) Interventions    Readmission Risk Interventions No flowsheet data found.

## 2019-03-28 NOTE — Discharge Instructions (Signed)
You were admitted for a small bowel obstruction.  Please continue to eat soft foods for the next few days and gradually eat more normal foods if you tolerate soft foods well.  Please call your doctor if you have any concerns.

## 2019-03-28 NOTE — Progress Notes (Signed)
[facility] Progress Note      Assessment/Plan Crohn's disease on immunosuppression Parastomal hernia Metastatic prostate cancer- on palliative treatment, not immediate threat to life. Ostomy in place Moderate protein calorie malnutrition Chronic pain   Partial small bowel obstruction -SBO protocol initiated and shows contrast in his small bowel that is dilated; however he appears to have chronically dilated small bowel.  He has emptied his ileostomy 4-5 times since admission.  He is clearly not obstructed.   -mobilize and pulm toilet  FEN -FLD VTE -Lovenox ID -none currently Follow up: GI  Plan: if pt tolerates a FLD he is okay for discharge from our standpoint. He is having good ostomy output, tolerating clears and not having N or V.    LOS: 2 days    Subjective: CC: SBO  No abdominal pain, nausea or vomiting overnight. He states good ostomy stool and gas output. No complaints.   Objective: Vital signs in last 24 hours: Temp:  [97.9 F (36.6 C)-98.6 F (37 C)] 97.9 F (36.6 C) (04/06 0433) Pulse Rate:  [76-84] 76 (04/06 0433) Resp:  [16-18] 17 (04/06 0433) BP: (116-128)/(71-80) 116/71 (04/06 0433) SpO2:  [98 %-100 %] 100 % (04/06 0433) Last BM Date: 03/27/19  Intake/Output from previous day: 04/05 0701 - 04/06 0700 In: 570 [P.O.:570] Out: -  Intake/Output this shift: No intake/output data recorded.  PE: Gen:  Alert, NAD, pleasant, cooperative Pulm:  Rate and effort normal Abd: soft, NT, ND, +BS, liquid and gas output in bag  Skin: warm and dry   Anti-infectives: Anti-infectives (From admission, onward)   None      Lab Results:  Recent Labs    03/26/19 0256 03/27/19 0314  WBC 6.7 9.0  HGB 10.7* 11.4*  HCT 33.5* 35.2*  PLT 383 428*   BMET Recent Labs    03/26/19 0256 03/27/19 0314  NA 134* 137  K 3.8 4.2  CL 104 108  CO2 22 17*  GLUCOSE 74 81  BUN 6* 6*  CREATININE 1.02 1.13  CALCIUM 7.3* 7.0*    PT/INR No results for input(s): LABPROT, INR in the last 72 hours. CMP     Component Value Date/Time   NA 137 03/27/2019 0314   NA 137 11/20/2017 0931   K 4.2 03/27/2019 0314   K 3.3 (L) 11/20/2017 0931   CL 108 03/27/2019 0314   CO2 17 (L) 03/27/2019 0314   CO2 27 11/20/2017 0931   GLUCOSE 81 03/27/2019 0314   GLUCOSE 99 11/20/2017 0931   BUN 6 (L) 03/27/2019 0314   BUN 16.6 11/20/2017 0931   CREATININE 1.13 03/27/2019 0314   CREATININE 1.02 12/01/2018 0920   CREATININE 0.99 12/02/2017 1249   CREATININE 1.2 11/20/2017 0931   CALCIUM 7.0 (L) 03/27/2019 0314   CALCIUM 8.9 11/20/2017 0931   PROT 8.1 03/25/2019 1833   PROT 7.5 11/20/2017 0931   ALBUMIN 3.3 (L) 03/25/2019 1833   ALBUMIN 3.1 (L) 11/20/2017 0931   AST 22 03/25/2019 1833   AST 16 12/01/2018 0920   AST 38 (H) 11/20/2017 0931   ALT 13 03/25/2019 1833   ALT 6 12/01/2018 0920   ALT 45 11/20/2017 0931   ALKPHOS 83 03/25/2019 1833   ALKPHOS 62 11/20/2017 0931   BILITOT 0.8 03/25/2019 1833   BILITOT 0.7 12/01/2018 0920   BILITOT 0.66 11/20/2017 0931   GFRNONAA >60 03/27/2019 0314   GFRNONAA >60 12/01/2018 0920   GFRNONAA 52 (L) 02/11/2017 1640   GFRAA >60 03/27/2019 0034  GFRAA >60 12/01/2018 0920   GFRAA 60 02/11/2017 1640   Lipase     Component Value Date/Time   LIPASE 20 03/25/2019 1833    Studies/Results: Dg Abd Portable 1v-small Bowel Obstruction Protocol-initial, 8 Hr Delay  Result Date: 03/26/2019 CLINICAL DATA:  Small bowel obstruction. EXAM: PORTABLE ABDOMEN - 1 VIEW COMPARISON:  March 26, 2019 FINDINGS: There is an NG tube terminating left upper quadrant, likely within the gastric body. There is high attenuation material adjacent to the NG tube consistent with injected or ingested contrast. Dilated contrast filled loops of small bowel are seen consistent with the patient's small-bowel obstruction. No other acute abnormalities. IMPRESSION: 1. Dilated contrast filled loops of small bowel consistent  with continued small bowel obstruction. Electronically Signed   By: Dorise Bullion III M.D   On: 03/26/2019 19:41   Dg Abd Portable 1v-small Bowel Protocol-position Verification  Result Date: 03/26/2019 CLINICAL DATA:  Reason for exam: Encounter for imaging study to confirm nasogastric (NG) tube placement. Per pt no abdominal pain at the momentt. Pt has colostomy bag RLQ. Hx of crohn's disease, abnormal finding on biliary tract, spigelian hernia, duodenal ulcer, peristomal hernia, SOB. EXAM: PORTABLE ABDOMEN - 1 VIEW COMPARISON:  CT, 03/25/2019. FINDINGS: Nasogastric tube passes below the diaphragm, tip projecting in the distal stomach. Left mid to lower abdomen hernia containing loops of bowel is noted. There are no dilated air-filled loops of bowel. IMPRESSION: Well-positioned nasogastric tube. Electronically Signed   By: Lajean Manes M.D.   On: 03/26/2019 09:32      Kalman Drape , [facility] 03/28/2019, 8:24 AM  Pager: [phone] Mon-Wed, Friday 7:00am-4:30pm Thurs 7am-11:30am  Consults: [phone]

## 2019-04-01 ENCOUNTER — Other Ambulatory Visit: Payer: Self-pay

## 2019-04-01 ENCOUNTER — Other Ambulatory Visit: Payer: Medicare Other | Admitting: Adult Health Nurse Practitioner

## 2019-04-01 DIAGNOSIS — Z515 Encounter for palliative care: Secondary | ICD-10-CM | POA: Diagnosis not present

## 2019-04-01 NOTE — Progress Notes (Signed)
[facility] Consult Note Telephone: [phone]  Fax: [phone]  PATIENT NAME: Daniel Howard DOB: [birth year] MRN: [id number]  PRIMARY CARE PROVIDER:   Alycia Rossetti, MD  REFERRING PROVIDER:  Alycia Rossetti, MD [address] [place], [place] [address]  RESPONSIBLE PARTY:   Self and wife, Daniel Howard: [phone]                                                                                             C: [phone]  Due to the COVID-19 crisis, this telephone evaluation and treatment contact was done via telephone and it was initiated and consent by this patient and or family.    RECOMMENDATIONS and PLAN:  1.  Small bowel obstruction.  Patient had another hospitalization for SBO on 03/25/2019 to 03/28/2019.  Patient was also seen in ED on 03/28/2019 for URI symptoms.  Patient is doing much better since being back home.  Has been out weeding eating this morning. Has had regular bowel movements since discharge.  This is an ongoing problem related to multiple abdominal surgeries.  Recommend full liquid to soft diet to help minimize SBOs in the future  2.  Pain.  Patient's pain is controlled on current pain regimen  3.  Appetite.  Wife states that his appetite is starting to get back to normal and no significant weight loss with current hospitalizations.  4.  Advanced care directives. Has DNR in place.Patient has MOST form in place dated 01/13/2019 with wishes of no antibiotics, IV fluids, or feeding tube.  I spent 15 minutes providing this consultation,  from 10:00 to 10:15. More than 50% of the time in this consultation was spent coordinating communication.   HISTORY OF PRESENT ILLNESS:  Daniel Howard is a 63 y.o. year old male with multiple medical problems including prostate cancer, Crohn's. Palliative Care was asked to help address goals of care.   CODE STATUS: DNR  PPS: 60% HOSPICE ELIGIBILITY/DIAGNOSIS: TBD  PAST  MEDICAL HISTORY:  Past Medical History:  Diagnosis Date  . Abnormal finding of biliary tract    MRCP shows pancreatic/biliary tract dilation. EUS 2010 confirmed dilation but no chronic pancreaitis or mass. Vascular ectasia crimpoing distal CBD.   [name] Anxiety   . 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 Duodenal ulcer 2010   nsaids  . History of blood transfusion 1992   "related to colon OR"  . Peristomal hernia   . Prostate cancer ([place]) 2018  . SBO (small bowel obstruction) ([place]) 11/10/2018  . Small bowel obstruction ([place]) 10/2017; 11/21/2017; 02/16/2018  . Spigelian hernia    bilateral    SOCIAL HX:  Social History   Tobacco Use  . Smoking status: Former Smoker    Packs/day: 0.75    Years: 32.00  Pack years: 24.00    Types: Cigarettes    Last attempt to quit: 12/21/2009    Years since quitting: 9.2  . Smokeless tobacco: Never Used  Substance Use Topics  . Alcohol use: No    Alcohol/week: 0.0 standard drinks    Comment: Former drinker    ALLERGIES:  Allergies  Allergen Reactions  . Penicillins Hives    Has patient had a PCN reaction causing immediate rash, facial/tongue/throat swelling, SOB or lightheadedness with hypotension: Yes Has patient had a PCN reaction causing severe rash involving mucus membranes or skin necrosis: No Has patient had a PCN reaction that required hospitalization: No Has patient had a PCN reaction occurring within the last 10 years: No If all of the above answers are "NO", then may proceed with Cephalosporin use.      PERTINENT MEDICATIONS:  Outpatient Encounter Medications as of 04/01/2019  Medication Sig   . Adalimumab (HUMIRA PEN) 40 MG/0.8ML PNKT Inject 1 Syringe into the skin once a week. (Patient taking differently: Inject 40 mg into the skin every Wednesday. )  . albuterol (PROVENTIL HFA) 108 (90 Base) MCG/ACT inhaler Inhale 2 puffs into the lungs every 6 (six) hours as needed for wheezing or shortness of breath.  . benzonatate (TESSALON) 200 MG capsule Take 1 capsule (200 mg total) by mouth 2 (two) times daily as needed for cough.  . budesonide (ENTOCORT EC) 3 MG 24 hr capsule TAKE 3 CAPSULES BY MOUTH DAILY. (Patient taking differently: Take 9 mg by mouth daily. )  . Calcium Carb-Cholecalciferol (CALCIUM 600-D PO) Take 2 tablets by mouth daily.  [name] dextromethorphan-guaiFENesin (MUCINEX DM) 30-600 MG 12hr tablet Take 1 tablet by mouth 2 (two) times daily.  . ferrous sulfate 325 (65 FE) MG tablet Take 325 mg by mouth daily with breakfast.  . lidocaine-prilocaine (EMLA) cream Apply 1 application topically daily as needed (when accessing port).   . mirtazapine (REMERON) 15 MG tablet TAKE 1 TABLET BY MOUTH AT BEDTIME (Patient taking differently: Take 15 mg by mouth at bedtime. )  . Multiple Vitamins-Minerals (CENTRUM SILVER 50+MEN) TABS Take 1 tablet by mouth daily with breakfast.  . oxyCODONE-acetaminophen (PERCOCET/ROXICET) 5-325 MG tablet Take 1 tablet by mouth every 4 (four) hours as needed for moderate pain.  [name] SIMETHICONE PO Take 1-2 tablets by mouth as needed (gas/flatulence).   [name] 40 MG capsule TAKE 4 CAPSULES (160 MG TOTAL) BY MOUTH DAILY. (Patient taking differently: Take 160 mg by mouth daily. )   Facility-Administered Encounter Medications as of 04/01/2019  Medication  . sodium chloride flush (NS) 0.9 % injection 10 mL     Amy Jenetta Downer, NP

## 2019-04-03 ENCOUNTER — Other Ambulatory Visit: Payer: Self-pay | Admitting: Nurse Practitioner

## 2019-04-07 ENCOUNTER — Emergency Department (HOSPITAL_COMMUNITY): Payer: Medicare Other

## 2019-04-07 ENCOUNTER — Other Ambulatory Visit: Payer: Self-pay

## 2019-04-07 ENCOUNTER — Encounter (HOSPITAL_COMMUNITY): Payer: Self-pay

## 2019-04-07 ENCOUNTER — Emergency Department (HOSPITAL_COMMUNITY)
Admission: EM | Admit: 2019-04-07 | Discharge: 2019-04-07 | Disposition: A | Discharge status: Home / Self Care | Payer: Medicare Other | Attending: Emergency Medicine | Admitting: Emergency Medicine

## 2019-04-07 DIAGNOSIS — C7951 Secondary malignant neoplasm of bone: Secondary | ICD-10-CM | POA: Insufficient documentation

## 2019-04-07 DIAGNOSIS — R05 Cough: Secondary | ICD-10-CM | POA: Diagnosis not present

## 2019-04-07 DIAGNOSIS — Z79899 Other long term (current) drug therapy: Secondary | ICD-10-CM | POA: Diagnosis not present

## 2019-04-07 DIAGNOSIS — Z8546 Personal history of malignant neoplasm of prostate: Secondary | ICD-10-CM | POA: Insufficient documentation

## 2019-04-07 DIAGNOSIS — R058 Other specified cough: Secondary | ICD-10-CM

## 2019-04-07 DIAGNOSIS — Z87891 Personal history of nicotine dependence: Secondary | ICD-10-CM | POA: Diagnosis not present

## 2019-04-07 MED ORDER — AZITHROMYCIN 250 MG PO TABS: ORAL_TABLET | ORAL | 0 refills | Status: DC

## 2019-04-07 NOTE — ED Triage Notes (Signed)
Pt reports cough, congestion, productive with green sputum. Sx ongoing X3 weeks.

## 2019-04-07 NOTE — Discharge Instructions (Signed)
It was our pleasure to provide your ER care today - we hope that you feel better.  Take zithromax as prescribed.   Follow up with primary care doctor in the next couple weeks if symptoms fail to improve/resolve.  Quarantine yourself to home for 7 days, and atleast 3 days after fever/cough resolve.  Return to ER if worse, increased trouble breathing, other concern.

## 2019-04-07 NOTE — ED Provider Notes (Signed)
[facility] EMERGENCY DEPARTMENT Provider Note   CSN: [id number] Arrival date & time: 04/07/19  1506    History   Chief Complaint Chief Complaint  Patient presents with   Cough    HPI Daniel Howard is a 63 y.o. male.     Patient c/o productive cough for the past 2-3 weeks. Symptoms acute onset, episodic, persistent, ?worse in past couple days. +occasional greenish sputum. No sob. No chest pain or discomfort. Former smoker. Denies fever or chills. No sore throat or runny nose. No known covid exposure or recent travel.   The history is provided by the patient.  Cough  Associated symptoms: no chest pain, no fever, no headaches, no rash, no shortness of breath and no sore throat     Past Medical History:  Diagnosis Date   Abnormal finding of biliary tract    MRCP shows pancreatic/biliary tract dilation. EUS 2010 confirmed dilation but no chronic pancreaitis or mass. Vascular ectasia crimpoing distal CBD.    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.s/p transverse loop colostomy secondary to stricture 1992., followed by end-transverse ostomy, followed by right hemicolectomy, followed  by takedown & ileostomy   Duodenal ulcer 2010   nsaids   History of blood transfusion 1992   "related to colon OR"   Peristomal hernia    Prostate cancer ([place]) 2018   SBO (small bowel obstruction) ([place]) 11/10/2018   Small bowel obstruction ([place]) 10/2017; 11/21/2017; 02/16/2018   Spigelian hernia    bilateral    Patient Active Problem List   Diagnosis Date Noted   Incarcerated hernia 01/09/2019   Palliative care encounter    Small bowel obstruction due to adhesions ([place]) 12/29/2018   Small bowel obstruction ([place]) 12/03/2018   Partial small bowel obstruction ([place])  11/01/2018   SBO (small bowel obstruction) ([place]) 09/30/2018   Iron deficiency anemia 07/03/2018   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   Nausea without vomiting 11/10/2017   Incisional hernia 09/22/2017   Abdominal pain of multiple sites 09/22/2017   Goals of care, counseling/discussion 04/02/2017   Prostate cancer metastatic to bone ([place]) 04/02/2017   Elevated PSA 03/04/2017   Osteopenia 11/03/2016   Pelvic mass in male 09/04/2016   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   Exacerbation of Crohn's disease ([place]) 09/04/2016   Family history of colon cancer 10/12/2014   Protein-calorie malnutrition, severe ([place]) 07/27/2014   Loss of weight 06/09/2014   Crohn's disease of both small and large intestine with complication ([place]) 89/16/9450   Boils 02/11/2013   Ventral hernia 12/11/2009   Anemia 12/03/2009   Regional enteritis/Crohn's 01/25/2007    Past Surgical History:  Procedure Laterality Date   APPENDECTOMY  1992   at time of exp laparotomy at which time he was noted to have fistulizing Crohn's rather than UC   COLON SURGERY     COLONOSCOPY [name] 08/23/2014   [id number] proctoscopy with possible fistulous opening in thebase of rectal/anal stump.     COLOSTOMY  1992   transverse loop colostomy secondary to a stricture   ESOPHAGOGASTRODUODENOSCOPY  05/2009   SLF: multiple antral erosions, large ulcer at ansatomosis (postsurgical changes at duodenal bulb and second portion of duodenum) BX c/x NSAIDS.   EUS  10/04/2009   Dr. Estill Bakes dilated CBD and main pancreatic duct.  No pancreatic  EXPLORATORY LAPAROTOMY  1992   FLEXIBLE SIGMOIDOSCOPY  1988   Dr. Laural Golden- suggested rohn's disease but the biopsies were not collaborative.   FLEXIBLE SIGMOIDOSCOPY [name] 09/08/2016   Procedure: FLEXIBLE SIGMOIDOSCOPY;  Surgeon: Wonda Horner, MD;  Location: [facility] ENDOSCOPY;  Service: Gastroenterology;  Laterality: [name];   [place]   incarcerated periostial hernia with additional surgery in [place] RIGHT  04/03/2017   IR US GUIDE VASC ACCESS RIGHT  04/03/2017        Home Medications    Prior to Admission medications   Medication Sig Start Date End Date Taking? Authorizing Provider  Adalimumab (HUMIRA PEN) 40 MG/0.8ML PNKT Inject 1 Syringe into the skin once a week. Patient taking differently: Inject 40 mg into the skin every Wednesday.  09/01/18   Annitta Needs, NP  albuterol (PROVENTIL HFA) 108 (90 Base) MCG/ACT inhaler Inhale 2 puffs into the lungs every 6 (six) hours as needed for wheezing or shortness of breath.    [provider]  benzonatate (TESSALON) 200 MG capsule Take 1 capsule (200 mg total) by mouth 2 (two) times daily as needed for cough. 03/28/19   Raylene Everts, MD  budesonide (ENTOCORT EC) 3 MG 24 hr capsule TAKE 3 CAPSULES BY MOUTH EVERY DAY 04/04/19   Mahala Menghini, PA-C  Calcium Carb-Cholecalciferol (CALCIUM 600-D PO) Take 2 tablets by mouth daily.    [provider]  dextromethorphan-guaiFENesin (MUCINEX DM) 30-600 MG 12hr tablet Take 1 tablet by mouth 2 (two) times daily. 03/28/19   Raylene Everts, MD  ferrous sulfate 325 (65 FE) MG tablet Take 325 mg by mouth daily with breakfast.    [provider]  lidocaine-prilocaine (EMLA) cream Apply 1 application topically daily as needed (when accessing port).     [provider]  mirtazapine (REMERON) 15 MG tablet TAKE 1 TABLET BY MOUTH AT BEDTIME Patient taking differently: Take 15 mg by mouth at bedtime.  03/21/19   [employer], [name], MD  Multiple Vitamins-Minerals (CENTRUM SILVER 50+MEN) TABS Take 1 tablet by mouth daily with breakfast.    [provider]  oxyCODONE-acetaminophen (PERCOCET/ROXICET) 5-325 MG tablet Take 1 tablet by mouth every 4 (four) hours as needed for moderate pain. 03/23/19   Alycia Rossetti, MD  SIMETHICONE PO Take 1-2 tablets by  mouth as needed (gas/flatulence).     [provider]  XTANDI 40 MG capsule TAKE 4 CAPSULES (160 MG TOTAL) BY MOUTH DAILY. Patient taking differently: Take 160 mg by mouth daily.  03/11/19   [name], MD    Family History Family History  Problem Relation Age of Onset   Cancer Father        prostate    Prostate cancer Father    Colon cancer Father 79   Hypertension Sister    Cancer Sister    Depression Sister    Breast cancer Sister    COPD Sister    Aneurysm Brother        deceased, brain aneurysm    Social History Social History   Tobacco Use   Smoking status: Former Smoker    Packs/day: 0.75    Years: 32.00    Pack years: 24.00    Types: Cigarettes    Last attempt to quit: 12/21/2009    Years since quitting: 9.2   Smokeless tobacco: Never Used  Substance Use  Topics   Alcohol use: No    Alcohol/week: 0.0 standard drinks    Comment: Former drinker   Drug use: No     Allergies   Penicillins   Review of Systems Review of Systems  Constitutional: Negative for fever.  HENT: Negative for sore throat.   Eyes: Negative for redness.  Respiratory: Positive for cough. Negative for shortness of breath.   Cardiovascular: Negative for chest pain.  Gastrointestinal: Negative for abdominal pain and vomiting.  Genitourinary: Negative for flank pain.  Musculoskeletal: Negative for back pain and neck pain.  Skin: Negative for rash.  Neurological: Negative for headaches.  Hematological: Does not bruise/bleed easily.  Psychiatric/Behavioral: Negative for confusion.     Physical Exam Updated Vital Signs BP 119/77 (BP Location: Right Arm)    Pulse 90    Temp 98.6 F (37 C) (Oral)    Resp 16    SpO2 99%   Physical Exam Vitals signs and nursing note reviewed.  Constitutional:      Appearance: Normal appearance. He is well-developed.  HENT:     Head: Atraumatic.     Nose: Nose normal.     Mouth/Throat:     Mouth: Mucous membranes are  moist.     Pharynx: Oropharynx is clear.  Eyes:     General: No scleral icterus.    Conjunctiva/sclera: Conjunctivae normal.  Neck:     Musculoskeletal: Normal range of motion and neck supple. No neck rigidity.     Trachea: No tracheal deviation.  Cardiovascular:     Rate and Rhythm: Normal rate and regular rhythm.     Pulses: Normal pulses.     Heart sounds: Normal heart sounds. No murmur. No friction rub. No gallop.   Pulmonary:     Effort: Pulmonary effort is normal. No accessory muscle usage or respiratory distress.     Breath sounds: Normal breath sounds.  Abdominal:     General: Bowel sounds are normal. There is no distension.     Palpations: Abdomen is soft.     Tenderness: There is no abdominal tenderness.  Genitourinary:    Comments: No cva tenderness. Musculoskeletal:        General: No swelling.     Right lower leg: No edema.     Left lower leg: No edema.  Skin:    General: Skin is warm and dry.     Findings: No rash.  Neurological:     Mental Status: He is alert.     Comments: Alert, speech clear.   Psychiatric:        Mood and Affect: Mood normal.      ED Treatments / Results  Labs (all labs ordered are listed, but only abnormal results are displayed) Labs Reviewed - No data to display  EKG None  Radiology Dg Chest Port 1 View  Result Date: 04/07/2019 CLINICAL DATA:  Cough EXAM: PORTABLE CHEST 1 VIEW COMPARISON:  March 17, 2019 chest radiograph and chest CT February 20, 2019 FINDINGS: Port-A-Cath tip is in the superior vena cava. No pneumothorax. There is no appreciable edema or consolidation. Heart size and pulmonary vascularity are normal. Slight fullness on the right hilum is likely due to lymph node prominence, demonstrated on recent prior CT. No bone lesions. IMPRESSION: No edema or consolidation. Slight right hilar fullness, likely due to lymph node prominence, demonstrated on recent CT. Port-A-Cath tip in superior vena cava. No pneumothorax. Heart size  normal. Electronically Signed   By: Lowella Grip III M.D.  On: 04/07/2019 16:18    Procedures Procedures (including critical care time)  Medications Ordered in ED Medications - No data to display   Initial Impression / Assessment and Plan / ED Course  I have reviewed the triage vital signs and the nursing notes.  Pertinent labs & imaging results that were available during my care of the patient were reviewed by me and considered in my medical decision making (see chart for details).  CXR.  Reviewed nursing notes and prior charts for additional history.   cxr reviewed by me - no pna.   rx for home.  KEIJI MELLAND was evaluated in Emergency Department on 04/07/2019 for the symptoms described in the history of present illness. He was evaluated in the context of the global COVID-19 pandemic, which necessitated consideration that the patient might be at risk for infection with the SARS-CoV-2 virus that causes COVID-19. Institutional protocols and algorithms that pertain to the evaluation of patients at risk for COVID-19 are in a state of rapid change based on information released by regulatory bodies including the CDC and federal and state organizations. These policies and algorithms were followed during the patient's care in the ED.   Final Clinical Impressions(s) / ED Diagnoses   Final diagnoses:  None    ED Discharge Orders    None       Lajean Saver, MD 04/07/19 [phone]

## 2019-04-08 DIAGNOSIS — K5090 Crohn's disease, unspecified, without complications: Secondary | ICD-10-CM | POA: Diagnosis not present

## 2019-04-08 DIAGNOSIS — Z933 Colostomy status: Secondary | ICD-10-CM | POA: Diagnosis not present

## 2019-04-11 ENCOUNTER — Telehealth: Payer: Self-pay | Admitting: Oncology

## 2019-04-11 NOTE — Telephone Encounter (Signed)
Change appt for 5/13 to phone call per 4/20 sch message - unable to reach patient , left message.

## 2019-04-14 ENCOUNTER — Other Ambulatory Visit: Payer: Self-pay | Admitting: Oncology

## 2019-04-14 DIAGNOSIS — C61 Malignant neoplasm of prostate: Secondary | ICD-10-CM

## 2019-04-18 ENCOUNTER — Other Ambulatory Visit (HOSPITAL_COMMUNITY): Payer: Self-pay | Admitting: Urology

## 2019-04-18 ENCOUNTER — Other Ambulatory Visit: Payer: Self-pay | Admitting: Urology

## 2019-04-18 DIAGNOSIS — C61 Malignant neoplasm of prostate: Secondary | ICD-10-CM

## 2019-04-18 DIAGNOSIS — C7951 Secondary malignant neoplasm of bone: Secondary | ICD-10-CM | POA: Diagnosis not present

## 2019-04-18 DIAGNOSIS — R9721 Rising PSA following treatment for malignant neoplasm of prostate: Secondary | ICD-10-CM

## 2019-04-19 ENCOUNTER — Other Ambulatory Visit: Payer: Self-pay

## 2019-04-19 ENCOUNTER — Encounter (HOSPITAL_COMMUNITY)
Admission: RE | Admit: 2019-04-19 | Discharge: 2019-04-19 | Disposition: A | Discharge status: Home / Self Care | Payer: Medicare Other | Source: Ambulatory Visit | Attending: Urology | Admitting: Urology

## 2019-04-19 DIAGNOSIS — C61 Malignant neoplasm of prostate: Secondary | ICD-10-CM | POA: Insufficient documentation

## 2019-04-19 DIAGNOSIS — R9721 Rising PSA following treatment for malignant neoplasm of prostate: Secondary | ICD-10-CM | POA: Diagnosis present

## 2019-04-19 DIAGNOSIS — R948 Abnormal results of function studies of other organs and systems: Secondary | ICD-10-CM | POA: Diagnosis not present

## 2019-04-19 MED ORDER — TECHNETIUM TC 99M MEDRONATE IV KIT
21.3000 | PACK | Freq: Once | INTRAVENOUS | Status: AC | PRN
  Administered 2019-04-19: 21.3 via INTRAVENOUS

## 2019-04-20 ENCOUNTER — Encounter: Payer: Self-pay | Admitting: Family Medicine

## 2019-04-20 ENCOUNTER — Ambulatory Visit (INDEPENDENT_AMBULATORY_CARE_PROVIDER_SITE_OTHER): Payer: Medicare Other | Admitting: Family Medicine

## 2019-04-20 DIAGNOSIS — C61 Malignant neoplasm of prostate: Secondary | ICD-10-CM | POA: Diagnosis not present

## 2019-04-20 DIAGNOSIS — Z933 Colostomy status: Secondary | ICD-10-CM

## 2019-04-20 DIAGNOSIS — E43 Unspecified severe protein-calorie malnutrition: Secondary | ICD-10-CM

## 2019-04-20 DIAGNOSIS — C7951 Secondary malignant neoplasm of bone: Secondary | ICD-10-CM | POA: Diagnosis not present

## 2019-04-20 DIAGNOSIS — J209 Acute bronchitis, unspecified: Secondary | ICD-10-CM | POA: Diagnosis not present

## 2019-04-20 MED ORDER — DOXYCYCLINE HYCLATE 100 MG PO TABS: 100.0000 mg | ORAL_TABLET | Freq: Two times a day (BID) | ORAL | 0 refills | Status: DC

## 2019-04-20 MED ORDER — OXYCODONE-ACETAMINOPHEN 10-325 MG PO TABS: 1.0000 | ORAL_TABLET | Freq: Three times a day (TID) | ORAL | 0 refills | Status: DC | PRN

## 2019-04-20 NOTE — Progress Notes (Signed)
Virtual Visit via Telephone Note  I connected with Daniel Howard on 04/20/19 at 10:59am  by telephone and verified that I am speaking with the correct person using two identifiers.   Pt location: at home   Physician location: in office, [place]   On call: patient, his Wife and physician I discussed the limitations, risks, security and privacy concerns of performing an evaluation and management service by telephone and the availability of in person appointments. I also discussed with the patient that there may be a patient responsible charge related to this service. The patient expressed understanding and agreed to proceed.   History of Present Illness:  Still coughing up for past 4-5 weeks , dark yellow phlegm  No fever. Occ feels SOB , he has had runny nose, but no sinus pressure , earache or sore throat No known sick contacts.  He was seen in urgent care about 4 weeks ago and then seen in the ER 2 weeks ago.  He did have a chest x-ray there was no pneumonia or pleural effusion at that time.  The chart states that he was given azithromycin but he states that he never received any antibiotics.  Likely because the azithromycin has a shortage  He has been taking Mucinex DM    Chronic pain metastatic prostate cancer.  His PSA recently went up to 10 he was seen by his urologist a week or so ago.  He did have a PET scan done yesterday which not show any new lesions he is waiting his follow-up with oncology to see if they can change his hormone deprivation medication and his chemo.  He is still taking 15 mg of Remeron to help with his appetite.  His weight has dropped down he was 137 pounds in February is now 128 pounds He does state that the pain medication has not been helping so he had been taking 2 of the tablets and running out early.    Observations/Objective:   Assessment and Plan:  bRONCHITIS-treat with doxycycline since the azithromycin is on back order and he has  allergy to penicillin.  He is high risk for pneumonia.  He does not have any fever and has had symptoms for over a month now.  He is quarantine within his home in general.  Chronic pain secondary to metastatic prostate cancer  And Crohn's will  increase his Percocet to 10/325 mg number 60/month.   Protein Malnutrition- continue remeron, pt also on palliative care   Follow Up Instructions:    I discussed the assessment and treatment plan with the patient. The patient was provided an opportunity to ask questions and all were answered. The patient agreed with the plan and demonstrated an understanding of the instructions.   The patient was advised to call back or seek an in-person evaluation if the symptoms worsen or if the condition fails to improve as anticipated.  I provided 13 minutes of non-face-to-face time during this encounter. End time: 11:12am   Vic Blackbird, MD

## 2019-04-21 MED FILL — XTANDI 40 MG CAPSULE: 40 | 30 days supply | Qty: 120 | Fill #0

## 2019-04-24 ENCOUNTER — Observation Stay (HOSPITAL_COMMUNITY): Payer: Medicare Other

## 2019-04-24 ENCOUNTER — Inpatient Hospital Stay (HOSPITAL_COMMUNITY)
Admission: EM | Admit: 2019-04-24 | Discharge: 2019-04-26 | DRG: 388 | Disposition: A | Discharge status: Home / Self Care | Payer: Medicare Other | Attending: Family Medicine | Admitting: Family Medicine

## 2019-04-24 ENCOUNTER — Other Ambulatory Visit: Payer: Self-pay

## 2019-04-24 ENCOUNTER — Emergency Department (HOSPITAL_COMMUNITY): Payer: Medicare Other

## 2019-04-24 DIAGNOSIS — Z1159 Encounter for screening for other viral diseases: Secondary | ICD-10-CM | POA: Diagnosis not present

## 2019-04-24 DIAGNOSIS — Z800 Family history of malignant neoplasm of digestive organs: Secondary | ICD-10-CM

## 2019-04-24 DIAGNOSIS — E440 Moderate protein-calorie malnutrition: Secondary | ICD-10-CM | POA: Diagnosis not present

## 2019-04-24 DIAGNOSIS — G8929 Other chronic pain: Secondary | ICD-10-CM | POA: Diagnosis present

## 2019-04-24 DIAGNOSIS — Z7952 Long term (current) use of systemic steroids: Secondary | ICD-10-CM | POA: Diagnosis not present

## 2019-04-24 DIAGNOSIS — Z932 Ileostomy status: Secondary | ICD-10-CM

## 2019-04-24 DIAGNOSIS — Z87891 Personal history of nicotine dependence: Secondary | ICD-10-CM

## 2019-04-24 DIAGNOSIS — K439 Ventral hernia without obstruction or gangrene: Secondary | ICD-10-CM | POA: Diagnosis not present

## 2019-04-24 DIAGNOSIS — K432 Incisional hernia without obstruction or gangrene: Secondary | ICD-10-CM | POA: Diagnosis not present

## 2019-04-24 DIAGNOSIS — R112 Nausea with vomiting, unspecified: Secondary | ICD-10-CM | POA: Diagnosis not present

## 2019-04-24 DIAGNOSIS — K469 Unspecified abdominal hernia without obstruction or gangrene: Secondary | ICD-10-CM | POA: Diagnosis not present

## 2019-04-24 DIAGNOSIS — D72829 Elevated white blood cell count, unspecified: Secondary | ICD-10-CM

## 2019-04-24 DIAGNOSIS — R109 Unspecified abdominal pain: Secondary | ICD-10-CM | POA: Diagnosis not present

## 2019-04-24 DIAGNOSIS — D649 Anemia, unspecified: Secondary | ICD-10-CM | POA: Diagnosis not present

## 2019-04-24 DIAGNOSIS — C61 Malignant neoplasm of prostate: Secondary | ICD-10-CM | POA: Diagnosis present

## 2019-04-24 DIAGNOSIS — Z79899 Other long term (current) drug therapy: Secondary | ICD-10-CM

## 2019-04-24 DIAGNOSIS — C7951 Secondary malignant neoplasm of bone: Secondary | ICD-10-CM | POA: Diagnosis present

## 2019-04-24 DIAGNOSIS — K651 Peritoneal abscess: Secondary | ICD-10-CM

## 2019-04-24 DIAGNOSIS — R918 Other nonspecific abnormal finding of lung field: Secondary | ICD-10-CM | POA: Diagnosis not present

## 2019-04-24 DIAGNOSIS — R093 Abnormal sputum: Secondary | ICD-10-CM

## 2019-04-24 DIAGNOSIS — Z66 Do not resuscitate: Secondary | ICD-10-CM | POA: Diagnosis present

## 2019-04-24 DIAGNOSIS — T40605A Adverse effect of unspecified narcotics, initial encounter: Secondary | ICD-10-CM | POA: Diagnosis present

## 2019-04-24 DIAGNOSIS — Z933 Colostomy status: Secondary | ICD-10-CM

## 2019-04-24 DIAGNOSIS — Z6820 Body mass index (BMI) 20.0-20.9, adult: Secondary | ICD-10-CM

## 2019-04-24 DIAGNOSIS — K5903 Drug induced constipation: Secondary | ICD-10-CM | POA: Diagnosis present

## 2019-04-24 DIAGNOSIS — J449 Chronic obstructive pulmonary disease, unspecified: Secondary | ICD-10-CM | POA: Diagnosis not present

## 2019-04-24 DIAGNOSIS — K435 Parastomal hernia without obstruction or  gangrene: Secondary | ICD-10-CM | POA: Diagnosis present

## 2019-04-24 DIAGNOSIS — Z8042 Family history of malignant neoplasm of prostate: Secondary | ICD-10-CM

## 2019-04-24 DIAGNOSIS — K5090 Crohn's disease, unspecified, without complications: Secondary | ICD-10-CM | POA: Diagnosis not present

## 2019-04-24 DIAGNOSIS — K50919 Crohn's disease, unspecified, with unspecified complications: Secondary | ICD-10-CM | POA: Diagnosis not present

## 2019-04-24 DIAGNOSIS — K56609 Unspecified intestinal obstruction, unspecified as to partial versus complete obstruction: Principal | ICD-10-CM | POA: Diagnosis present

## 2019-04-24 LAB — CBC WITH DIFFERENTIAL/PLATELET
Abs Immature Granulocytes: 0.04 10*3/uL (ref 0.00–0.07)
Basophils Absolute: 0.1 10*3/uL (ref 0.0–0.1)
Basophils Relative: 1 %
Eosinophils Absolute: 0.7 10*3/uL — ABNORMAL HIGH (ref 0.0–0.5)
Eosinophils Relative: 6 %
HCT: 36.8 % — ABNORMAL LOW (ref 39.0–52.0)
Hemoglobin: 11.9 g/dL — ABNORMAL LOW (ref 13.0–17.0)
Immature Granulocytes: 0 %
Lymphocytes Relative: 20 %
Lymphs Abs: 2.3 10*3/uL (ref 0.7–4.0)
MCH: 31.4 pg (ref 26.0–34.0)
MCHC: 32.3 g/dL (ref 30.0–36.0)
MCV: 97.1 fL (ref 80.0–100.0)
Monocytes Absolute: 0.6 10*3/uL (ref 0.1–1.0)
Monocytes Relative: 5 %
Neutro Abs: 7.9 10*3/uL — ABNORMAL HIGH (ref 1.7–7.7)
Neutrophils Relative %: 68 %
Platelets: 442 10*3/uL — ABNORMAL HIGH (ref 150–400)
RBC: 3.79 MIL/uL — ABNORMAL LOW (ref 4.22–5.81)
RDW: 14.1 % (ref 11.5–15.5)
WBC: 11.6 10*3/uL — ABNORMAL HIGH (ref 4.0–10.5)
nRBC: 0 % (ref 0.0–0.2)

## 2019-04-24 LAB — URINALYSIS, ROUTINE W REFLEX MICROSCOPIC
Bilirubin Urine: NEGATIVE
Glucose, UA: NEGATIVE mg/dL
Hgb urine dipstick: NEGATIVE
Ketones, ur: 5 mg/dL — AB
Leukocytes,Ua: NEGATIVE
Nitrite: NEGATIVE
Protein, ur: NEGATIVE mg/dL
Specific Gravity, Urine: 1.026 (ref 1.005–1.030)
pH: 5 (ref 5.0–8.0)

## 2019-04-24 LAB — COMPREHENSIVE METABOLIC PANEL
ALT: 13 U/L (ref 0–44)
AST: 29 U/L (ref 15–41)
Albumin: 3.3 g/dL — ABNORMAL LOW (ref 3.5–5.0)
Alkaline Phosphatase: 77 U/L (ref 38–126)
Anion gap: 10 (ref 5–15)
BUN: 10 mg/dL (ref 8–23)
CO2: 23 mmol/L (ref 22–32)
Calcium: 8.8 mg/dL — ABNORMAL LOW (ref 8.9–10.3)
Chloride: 104 mmol/L (ref 98–111)
Creatinine, Ser: 0.92 mg/dL (ref 0.61–1.24)
GFR calc Af Amer: >60 mL/min (ref >60)
GFR calc non Af Amer: >60 mL/min (ref >60)
Glucose, Bld: 89 mg/dL (ref 70–99)
Potassium: 3.7 mmol/L (ref 3.5–5.1)
Sodium: 137 mmol/L (ref 135–145)
Total Bilirubin: 0.8 mg/dL (ref 0.3–1.2)
Total Protein: 8.5 g/dL — ABNORMAL HIGH (ref 6.5–8.1)

## 2019-04-24 LAB — LIPASE, BLOOD: Lipase: 24 U/L (ref 11–51)

## 2019-04-24 LAB — SARS CORONAVIRUS 2 BY RT PCR (HOSPITAL ORDER, PERFORMED IN ~~LOC~~ HOSPITAL LAB): SARS Coronavirus 2: NEGATIVE

## 2019-04-24 MED ORDER — METRONIDAZOLE IN NACL 5-0.79 MG/ML-% IV SOLN
500.0000 mg | Freq: Once | INTRAVENOUS | Status: AC
  Administered 2019-04-24: 500 mg via INTRAVENOUS
  Filled 2019-04-24: qty 100

## 2019-04-24 MED ORDER — LIDOCAINE-PRILOCAINE 2.5-2.5 % EX CREA
1.0000 "application " | TOPICAL_CREAM | Freq: Every day | CUTANEOUS | Status: DC | PRN
  Filled 2019-04-24: qty 5

## 2019-04-24 MED ORDER — SODIUM CHLORIDE 0.9 % IV SOLN
INTRAVENOUS | Status: DC
  Administered 2019-04-24 – 2019-04-26 (×3): via INTRAVENOUS

## 2019-04-24 MED ORDER — ENZALUTAMIDE 40 MG PO CAPS
160.0000 mg | ORAL_CAPSULE | Freq: Every day | ORAL | Status: DC
  Filled 2019-04-24: qty 4

## 2019-04-24 MED ORDER — DM-GUAIFENESIN ER 30-600 MG PO TB12
1.0000 | ORAL_TABLET | Freq: Two times a day (BID) | ORAL | Status: DC
  Administered 2019-04-24 – 2019-04-26 (×4): 1 via ORAL
  Filled 2019-04-24 (×4): qty 1

## 2019-04-24 MED ORDER — MORPHINE SULFATE (PF) 2 MG/ML IV SOLN
2.0000 mg | INTRAVENOUS | Status: DC | PRN
  Administered 2019-04-24 – 2019-04-25 (×3): 2 mg via INTRAVENOUS
  Filled 2019-04-24 (×3): qty 1

## 2019-04-24 MED ORDER — POLYETHYLENE GLYCOL 3350 17 G PO PACK
17.0000 g | PACK | Freq: Every day | ORAL | Status: DC
  Administered 2019-04-25: 17 g via ORAL
  Filled 2019-04-24 (×2): qty 1

## 2019-04-24 MED ORDER — CIPROFLOXACIN IN D5W 400 MG/200ML IV SOLN
400.0000 mg | Freq: Once | INTRAVENOUS | Status: AC
  Administered 2019-04-24: 400 mg via INTRAVENOUS
  Filled 2019-04-24: qty 200

## 2019-04-24 MED ORDER — METRONIDAZOLE IN NACL 5-0.79 MG/ML-% IV SOLN
500.0000 mg | Freq: Three times a day (TID) | INTRAVENOUS | Status: DC
  Administered 2019-04-25 – 2019-04-26 (×4): 500 mg via INTRAVENOUS
  Filled 2019-04-24 (×4): qty 100

## 2019-04-24 MED ORDER — ALBUTEROL SULFATE HFA 108 (90 BASE) MCG/ACT IN AERS
2.0000 | INHALATION_SPRAY | Freq: Four times a day (QID) | RESPIRATORY_TRACT | Status: DC | PRN
  Administered 2019-04-25: 08:00:00 2 via RESPIRATORY_TRACT
  Filled 2019-04-24: qty 6.7

## 2019-04-24 MED ORDER — ACETAMINOPHEN 650 MG RE SUPP: 650.0000 mg | Freq: Four times a day (QID) | RECTAL | Status: DC | PRN

## 2019-04-24 MED ORDER — BUDESONIDE 3 MG PO CPEP
9.0000 mg | ORAL_CAPSULE | Freq: Every day | ORAL | Status: DC
  Administered 2019-04-24 – 2019-04-25 (×2): 9 mg via ORAL
  Filled 2019-04-24 (×3): qty 3

## 2019-04-24 MED ORDER — FENTANYL CITRATE (PF) 100 MCG/2ML IJ SOLN
50.0000 ug | Freq: Once | INTRAMUSCULAR | Status: AC
  Administered 2019-04-24: 17:00:00 50 ug via INTRAVENOUS
  Filled 2019-04-24: qty 2

## 2019-04-24 MED ORDER — CIPROFLOXACIN IN D5W 400 MG/200ML IV SOLN
400.0000 mg | Freq: Two times a day (BID) | INTRAVENOUS | Status: DC
  Administered 2019-04-25 (×2): 400 mg via INTRAVENOUS
  Filled 2019-04-24 (×4): qty 200

## 2019-04-24 MED ORDER — MIRTAZAPINE 15 MG PO TABS
15.0000 mg | ORAL_TABLET | Freq: Every day | ORAL | Status: DC
  Administered 2019-04-24 – 2019-04-25 (×2): 15 mg via ORAL
  Filled 2019-04-24 (×2): qty 1

## 2019-04-24 MED ORDER — ENOXAPARIN SODIUM 40 MG/0.4ML ~~LOC~~ SOLN
40.0000 mg | SUBCUTANEOUS | Status: DC
  Administered 2019-04-24 – 2019-04-25 (×2): 40 mg via SUBCUTANEOUS
  Filled 2019-04-24 (×2): qty 0.4

## 2019-04-24 MED ORDER — IOPAMIDOL (ISOVUE-300) INJECTION 61%
100.0000 mL | Freq: Once | INTRAVENOUS | Status: AC | PRN
  Administered 2019-04-24: 100 mL via INTRAVENOUS

## 2019-04-24 MED ORDER — SODIUM CHLORIDE 0.9 % IV SOLN
INTRAVENOUS | Status: DC
  Administered 2019-04-24: 17:00:00 via INTRAVENOUS

## 2019-04-24 MED ORDER — ONDANSETRON HCL 4 MG/2ML IJ SOLN
4.0000 mg | Freq: Once | INTRAMUSCULAR | Status: AC
  Administered 2019-04-24: 4 mg via INTRAVENOUS
  Filled 2019-04-24: qty 2

## 2019-04-24 MED ORDER — ONDANSETRON HCL 4 MG/2ML IJ SOLN
4.0000 mg | Freq: Four times a day (QID) | INTRAMUSCULAR | Status: DC
  Administered 2019-04-25 – 2019-04-26 (×6): 4 mg via INTRAVENOUS
  Filled 2019-04-24 (×6): qty 2

## 2019-04-24 NOTE — ED Provider Notes (Signed)
[facility] EMERGENCY DEPARTMENT Provider Note   CSN: [id number] Arrival date & time: 04/24/19  1517    History   Chief Complaint Chief Complaint  Patient presents with  . Abdominal Pain    HPI Daniel Howard is a 63 y.o. male.     HPI Patient presents with concern of abdominal pain, nausea, vomiting. Patient has history of Crohn's disease, has colostomy. He notes that over the past day he has had minimal output into the colostomy bag, with increasing abdominal pain. In addition to his history of colostomy, he has history of multiple surgeries, both revisions, he had resections, has known hernia. However, he states he was well until yesterday, now since that time he has had increasing pain, diffuse, sharp, severe, with associated anorexia, nausea, and soon after arrival he began vomiting small amounts of liquidy material. No medication taken for pain relief. No associated chest pain, no fever. Past Medical History:  Diagnosis Date  . Abnormal finding of biliary tract    MRCP shows pancreatic/biliary tract dilation. EUS 2010 confirmed dilation but no chronic pancreaitis or mass. Vascular ectasia crimpoing distal CBD.   [name] Anxiety   . 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 Duodenal ulcer 2010   nsaids  . History of blood transfusion 1992   "related to colon OR"  . Peristomal hernia   . Prostate cancer ([place]) 2018  . SBO (small bowel obstruction) ([place]) 11/10/2018  . Small bowel obstruction ([place]) 10/2017; 11/21/2017; 02/16/2018  . Spigelian hernia    bilateral    Patient Active Problem List   Diagnosis Date Noted  .  Incarcerated hernia 01/09/2019  . Palliative care encounter   . Small bowel obstruction due to adhesions ([place]) 12/29/2018  . Small bowel obstruction ([place]) 12/03/2018  . Partial small bowel obstruction ([place]) 11/01/2018  . SBO (small bowel obstruction) ([place]) 09/30/2018  . Iron deficiency anemia 07/03/2018  . 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 Nausea without vomiting 11/10/2017  . Incisional hernia 09/22/2017  . Abdominal pain of multiple sites 09/22/2017  . Goals of care, counseling/discussion 04/02/2017  . Prostate cancer metastatic to bone ([place]) 04/02/2017  . Elevated PSA 03/04/2017  . Osteopenia 11/03/2016  . Pelvic mass in male 09/04/2016  . 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 Exacerbation of Crohn's disease ([place]) 09/04/2016  . Family history of colon cancer 10/12/2014  . Protein-calorie malnutrition, severe ([place]) 07/27/2014  . Loss of weight 06/09/2014  . Crohn's disease of both small and large intestine with complication ([place]) 40/07/6760  . Boils 02/11/2013  . Ventral hernia 12/11/2009  . Anemia 12/03/2009  . Regional enteritis/Crohn's 01/25/2007    Past Surgical History:  Procedure Laterality Date  . APPENDECTOMY  1992   at time of exp laparotomy at which time he was noted to have fistulizing Crohn's rather than UC  . COLON SURGERY    . COLONOSCOPY [name] 08/23/2014   PJK:DTOIZTI proctoscopy with possible fistulous opening in thebase of rectal/anal stump.    . COLOSTOMY  1992   transverse loop colostomy secondary to a stricture  . ESOPHAGOGASTRODUODENOSCOPY  05/2009   SLF: multiple antral erosions, large ulcer at ansatomosis (postsurgical changes at duodenal bulb and second portion  of duodenum) BX c/x NSAIDS.  . EUS  10/04/2009   Dr. Estill Bakes dilated CBD and main pancreatic duct.  No pancreatic  . EXPLORATORY LAPAROTOMY  1992  . FLEXIBLE SIGMOIDOSCOPY  1988   Dr. Laural Golden- suggested rohn's disease but the biopsies were not collaborative.  [name] FLEXIBLE SIGMOIDOSCOPY [name]  09/08/2016   Procedure: FLEXIBLE SIGMOIDOSCOPY;  Surgeon: Wonda Horner, MD;  [facility]: [facility] ENDOSCOPY;  Service: Gastroenterology;  Laterality: [name];  . HEMICOLOECTOMY W/ ANASTOMOSIS  1993   R- Dr.DeMason   . HERNIA REPAIR  1996   incarcerated periostial hernia with additional surgery in 1999  . IR FLUORO GUIDE PORT INSERTION RIGHT  04/03/2017  . IR US GUIDE VASC ACCESS RIGHT  04/03/2017        Home Medications    Prior to Admission medications   Medication Sig Start Date End Date Taking? Authorizing Provider  Adalimumab (HUMIRA PEN) 40 MG/0.8ML PNKT Inject 1 Syringe into the skin once a week. Patient taking differently: Inject 40 mg into the skin every Wednesday.  09/01/18   Annitta Needs, NP  albuterol (PROVENTIL HFA) 108 (90 Base) MCG/ACT inhaler Inhale 2 puffs into the lungs every 6 (six) hours as needed for wheezing or shortness of breath.    [provider]  budesonide (ENTOCORT EC) 3 MG 24 hr capsule TAKE 3 CAPSULES BY MOUTH EVERY DAY 04/04/19   Mahala Menghini, PA-C  Calcium Carb-Cholecalciferol (CALCIUM 600-D PO) Take 2 tablets by mouth daily.    [provider]  dextromethorphan-guaiFENesin (MUCINEX DM) 30-600 MG 12hr tablet Take 1 tablet by mouth 2 (two) times daily. 03/28/19   Raylene Everts, MD  doxycycline (VIBRA-TABS) 100 MG tablet Take 1 tablet (100 mg total) by mouth 2 (two) times daily. 04/20/19   Alycia Rossetti, MD  ferrous sulfate 325 (65 FE) MG tablet Take 325 mg by mouth daily with breakfast.    [provider]  lidocaine-prilocaine (EMLA) cream Apply 1 application topically daily as needed (when accessing port).     [provider]  mirtazapine (REMERON) 15 MG tablet TAKE 1 TABLET BY MOUTH AT BEDTIME Patient taking differently: Take 15 mg by mouth at bedtime.  03/21/19   [place], [name], MD  Multiple Vitamins-Minerals (CENTRUM SILVER 50+MEN) TABS Take 1 tablet by mouth daily with breakfast.    [provider]   oxyCODONE-acetaminophen (PERCOCET) 10-325 MG tablet Take 1 tablet by mouth every 8 (eight) hours as needed for up to 5 days for pain. 04/20/19 04/25/19  Alycia Rossetti, MD  SIMETHICONE PO Take 1-2 tablets by mouth as needed (gas/flatulence).     [provider]  XTANDI 40 MG capsule TAKE 4 CAPSULES (160 MG TOTAL) BY MOUTH DAILY. 04/15/19   [name], MD    Family History Family History  Problem Relation Age of Onset  . Cancer Father        prostate   . Prostate cancer Father   . Colon cancer Father 95  . Hypertension Sister   . Cancer Sister   . Depression Sister   . Breast cancer Sister   . COPD Sister   . Aneurysm Brother        deceased, brain aneurysm    Social History Social History   Tobacco Use  . Smoking status: Former Smoker    Packs/day: 0.75    Years: 32.00    Pack years: 24.00    Types: Cigarettes    Last attempt to quit: 12/21/2009  Years since quitting: 9.3  . Smokeless tobacco: Never Used  Substance Use Topics  . Alcohol use: No    Alcohol/week: 0.0 standard drinks    Comment: Former drinker  . Drug use: No     Allergies   Penicillins   Review of Systems Review of Systems  Constitutional:       Per HPI, otherwise negative  HENT:       Per HPI, otherwise negative  Respiratory:       Per HPI, otherwise negative  Cardiovascular:       Per HPI, otherwise negative  Gastrointestinal: Positive for abdominal pain, nausea and vomiting.  Endocrine:       Negative aside from HPI  Genitourinary:       Neg aside from HPI   Musculoskeletal:       Per HPI, otherwise negative  Skin: Negative.   Neurological: Negative for syncope.     Physical Exam Updated Vital Signs BP 119/76   Pulse 84   Temp 98.9 F (37.2 C) (Oral)   Resp 19   Ht 5' 4"  (1.626 m)   Wt 58.1 kg   SpO2 98%   BMI 21.97 kg/m   Physical Exam Vitals signs and nursing note reviewed.  Constitutional:      General: He is not in acute distress.    Comments:  Sickly appearing adult male awake and alert  HENT:     Head: Normocephalic and atraumatic.  Eyes:     Conjunctiva/sclera: Conjunctivae normal.  Cardiovascular:     Rate and Rhythm: Normal rate and regular rhythm.  Pulmonary:     Effort: Pulmonary effort is normal. No respiratory distress.     Breath sounds: No stridor.  Abdominal:    Skin:    General: Skin is warm and dry.  Neurological:     Mental Status: He is alert and oriented to person, place, and time.      ED Treatments / Results  Labs (all labs ordered are listed, but only abnormal results are displayed) Labs Reviewed  COMPREHENSIVE METABOLIC PANEL - Abnormal; Notable for the following components:      Result Value   Calcium 8.8 (*)    Total Protein 8.5 (*)    Albumin 3.3 (*)    All other components within normal limits  CBC WITH DIFFERENTIAL/PLATELET - Abnormal; Notable for the following components:   WBC 11.6 (*)    RBC 3.79 (*)    Hemoglobin 11.9 (*)    HCT 36.8 (*)    Platelets 442 (*)    Neutro Abs 7.9 (*)    Eosinophils Absolute 0.7 (*)    All other components within normal limits  URINALYSIS, ROUTINE W REFLEX MICROSCOPIC - Abnormal; Notable for the following components:   Ketones, ur 5 (*)    All other components within normal limits  SARS CORONAVIRUS 2 (HOSPITAL ORDER, [facility])  LIPASE, BLOOD    EKG None  Radiology Ct Abdomen Pelvis W Contrast  Result Date: 04/24/2019 CLINICAL DATA:  High grade bowel obstruction, history of small-bowel obstruction, duodenal ulcer, prostate cancer, Crohn's disease, spigelian hernia EXAM: CT ABDOMEN AND PELVIS WITH CONTRAST TECHNIQUE: Multidetector CT imaging of the abdomen and pelvis was performed using the standard protocol following bolus administration of intravenous contrast. Sagittal and coronal MPR images reconstructed from axial data set. CONTRAST:  157m ISOVUE-300 IOPAMIDOL (ISOVUE-300) INJECTION 61% IV. No oral contrast.  COMPARISON:  03/25/2019 FINDINGS: Lower chest: Minimal atelectasis at LEFT lung  base. Hepatobiliary: Dependent gallstone within gallbladder. No gallbladder wall thickening. No focal hepatic abnormalities. Pancreas: Normal appearance Spleen: Normal appearance Adrenals/Urinary Tract: Adrenal glands normal appearance. Tiny RIGHT renal cyst. Kidneys and ureters normal appearance. Tiny dependent calculi within urinary bladder. Stomach/Bowel: Stomach unremarkable. Ostomy RIGHT lower quadrant likely ileostomy. Multiple small bowel loops and descending colon extend into a large LEFT spigelian hernia. Dilatation of colon proximal to the herniated segment. Question mild bowel wall thickening of the RIGHT colon. Appendix not visualized. Small bowel loops decompressed. A large gas and fluid collection in the mid abdomen at the level of the iliac crests appears represent a segment of colon rather than an abscess. Vascular/Lymphatic: Aorta normal caliber. Vascular structures patent. LEFT para-aortic adenopathy unchanged. Scattered normal and upper normal sized mesenteric lymph nodes RIGHT mid abdomen. Reproductive: Prostate gland enlarged, contiguous with abnormal soft tissue in the RIGHT pelvis which extends posterolaterally and abuts the rectum, stable since prior exams, overall in aggregate 6.0 x 4.2 x 5.1 cm, question local extension of prostate cancer versus post therapy soft tissue changes. Other: Linear collection of gas and fluid is identified in the anterior RIGHT upper quadrant, 5.7 x 0.8 cm image 32 questionably extending 5 cm length, does not appear to communicate with bowel, question small abscess. No additional free air or free fluid. LEFT spigelian hernia as noted above. Musculoskeletal: No acute osseous findings. IMPRESSION: LEFT lower quadrant ileostomy. Dilated colon proximal to herniation of the descending colon into a large LEFT spigelian hernia consistent with a degree of colonic obstruction. Additional  herniation of small bowel loops into LEFT spigelian hernia without small bowel dilatation/obstruction. Thin gas and fluid collection in the RIGHT upper quadrant anterior to the RIGHT colon 5.8 x 0.8 cm extending questionably 5 cm length question small abscess collection, does not appear to communicate with bowel. Abnormal soft tissue in the RIGHT pelvis contiguous with the prostate gland, question prostate cancer with local extension versus posttherapy changes; this is not significantly changed since previous study, recommend correlation with PSA and potentially follow-up PET-CT. Electronically Signed   By: Lavonia Dana M.D.   On: 04/24/2019 18:42    Procedures Procedures (including critical care time)  Medications Ordered in ED Medications  0.9 %  sodium chloride infusion ( Intravenous New Bag/Given 04/24/19 1634)  ciprofloxacin (CIPRO) IVPB 400 mg (400 mg Intravenous New Bag/Given 04/24/19 2003)  metroNIDAZOLE (FLAGYL) IVPB 500 mg (has no administration in time range)  fentaNYL (SUBLIMAZE) injection 50 mcg (50 mcg Intravenous Given 04/24/19 1636)  ondansetron (ZOFRAN) injection 4 mg (4 mg Intravenous Given 04/24/19 1634)  iopamidol (ISOVUE-300) 61 % injection 100 mL (100 mLs Intravenous Contrast Given 04/24/19 1812)     Initial Impression / Assessment and Plan / ED Course  I have reviewed the triage vital signs and the nursing notes.  Pertinent labs & imaging results that were available during my care of the patient were reviewed by me and considered in my medical decision making (see chart for details).    Chart review notable for 13 ED visits in a 79-monthperiod including 2 CT abdomen pelvis within the past 6 months.    This adult male with history of Crohn's, colostomy now presents with abdominal pain, minimal stool production, nausea, vomiting, anorexia. With his substantial surgical history, there is consideration of bowel obstruction versus infection contributing to his symptoms. Though the  patient is afebrile initially, this is somewhat reassuring, labs, CT scan ordered after the initial evaluation. Patient also received pain control, antiemetics.  Completion of care will be completed by the physician assistant Street.   8:12 PM Patient remains in similar condition as on arrival. I reviewed the CT imaging results with the physician assistant. With concern for abscess, and given the patient's leukocytosis, prior interventions, the patient will start antibiotics. With these concerns, patient will require admission.  No early evidence for bacteremia, sepsis, no peritonitis.  Final Clinical Impressions(s) / ED Diagnoses   Final diagnoses:  Intra-abdominal abscess ([place])  Abdominal pain, unspecified abdominal location  Nausea and vomiting in adult patient  Leukocytosis, unspecified type      Carmin Muskrat, MD 04/24/19 2013

## 2019-04-24 NOTE — H&P (Addendum)
[facility] Admission History and Physical Service Pager: [phone]  Patient name: Daniel Howard Medical record number: [id number] Date of birth: 09-Jun-1956 Age: 63 y.o. Gender: male  Primary Care Provider: Alycia Rossetti, MD Consultants: surgery Code Status: DNR  Chief Complaint: abdominal pain  Assessment and Plan: Daniel Howard is a 63 y.o. male presenting with small bowel obstruction and abdominal abscess.  Previous medical history is significant for Crohn's disease, colostomy with multiple revisions, metastatic prostate cancer, large left-sided incisional herni, multiple hospital admissions in the past year for SBO.  Small bowel obstruction Is a previous medical history significant for Crohn's disease, multiple abdominal surgeries with colostomy bag.  He reports 1 day of abdominal pain, no bowel movement for about 24 hours, one episode of NB/NB emesis.  He reports this pain feels similar to previous episodes of small bowel obstruction.  On presentation, his vitals were entirely within normal limits.  Admission labs were notable for WBC 11.6.  Abdominal CT shows dilation of the colon proximal to his hernia consistent with "a degree of colonic obstruction" also shows a gas and fluid collection of the right upper quadrant suspicious for abscess.  The most likely cause of his pain and symptoms at this time is small bowel obstruction secondary to scar tissue from multiple abdominal surgeries and longstanding hernia.  Surgery assessed in the ED and recommended no surgery at this time, no NG tube necessary, continued IV fluids and IV antibiotics.  Will admit for further treatment. -Admit to med telemetry, attending Dr. 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  -N.p.o., sips of water with meds -No NG tube at this time  -miralax -IV morphine 2 mg every 2 hours as needed,  consider transition to p.o. -Continue to monitor physical exam -Monitor vitals  Abdominal abscess He remained afebrile during his hospitalization so far.  Vitals were entirely within normal limits.  Labs are remarkable for WBC 11.6.  CT abdomen shows a gas and fluid collection in the right upper quadrant concerning for possible abscess.  General surgery assessed in the ED and noted that this may not be an abscess.  Surgery noted that it is too thin for aspiration at this time and recommended treatment with IV antibiotics for now.  Surgery also noted this patient will eventually need referral to a tertiary center for possible surgery.  Flagyl and Cipro started in the ED for abdominal abscess. -Continue Flagyl -Continue Cipro -Monitor vitals and physical exam  Recent treatment for bronchitis He reports that he recently spoke to physician via telephone encounter on 4/29.  At that time, he was given antibiotics.  Per chart review, it appears that he is being treated for bronchitis.  Doxycycline was chosen over azithromycin due to the backorder of azithromycin.  He is now received 2 days of treatment doxycycline.  He currently is expressing symptoms of wet cough for 2 weeks.  He has no respiratory distress, lung exam is unremarkable. -Follow-up chest x-ray -Consider continuation/discontinuation of doxy based on chest x-ray  History of Crohn's disease He reports that he has not had a flare related to his Crohn's disease in years.  He takes Humira once a week.  He reports that he has not taken his Humira about a month and a half because he is not supposed to take it when he has any cold symptoms or other ongoing illnesses.  We have  low suspicion that his Crohn's is contributing to his current situation. -Hold Humira  Anemia, chronic, stable Chart review shows that he is chronically anemic with a hemoglobin between 10 and 11.  Hemoglobin on admission was 11.9.  He takes daily iron supplements.  We will  hold iron supplements in the setting of SBO as it may worsen constipation. -Holding iron supplements  History of metastatic state cancer He has previously focused on hospice care although he was recently transitioned from hospice to palliative care.  At this time, he continues to take [place] daily for his prostate cancer.  This will certainly contributed to his prognosis if abdominal surgery is necessary for the above dimension possible abdominal abscess. -Continue Xtandi daily  Protein calorie malnutrition Likely related to his metastatic cancer.  Albumin 3.3.  Home medication includes Remeron. -Continue Remeron nightly -Consider nutrition consult with the resolution of his SBO  Hypocalcemia 8.8 on admission today.  Home medication includes calcium supplement.  Will hold in the setting of small bowel obstruction. -Consider restarting supplementation with resolution of SBO  FEN/GI: Sips with meds Prophylaxis: Lovenox  Disposition: 2 to 3 days of hospitalization anticipated before discharge  History of Present Illness:  Daniel Howard is a 63 y.o. male presenting with small bowel obstruction and abdominal abscess.  Previous medical history is significant for Crohn's disease, colostomy with multiple revisions, metastatic prostate cancer, large left-sided incisional hernia.  He reports being in his normal state of health when he started to experience significant abdominal pain about 1 day ago.  The pain is described as a sharp pain beginning midline in his lower abdomen with slight radiation to the right side toward his colostomy.  He reports it can be as intense as 9-10/10.  Per his report this feels like his previous episodes of bowel obstruction.  He reported loose stools recently with his most recent bowel movement being the evening of 5/2.  He presented to the hospital today for evaluation of his abdominal pain during his time in emergency room, he experienced 1 episode of NB/NB emesis  following a drink of water.  He has not been feeling nauseated.  He reports that his colostomy appears normal without additional protrusion or contraction.  On review of systems, he noted he has been afebrile, denies bloody bowel movements, denies nausea.  Review Of Systems: Per HPI with the following additions:   Review of Systems  Constitutional: Negative for fever and malaise/fatigue.  HENT: Negative for congestion, hearing loss and sore throat.   Eyes: Negative for blurred vision.  Respiratory: Positive for cough (wet, 2 weeks) and sputum production. Negative for shortness of breath.   Cardiovascular: Negative for chest pain and palpitations.  Gastrointestinal: Positive for abdominal pain and vomiting. Negative for blood in stool and melena.  Genitourinary: Negative for dysuria.  Musculoskeletal: Negative for myalgias.  Skin: Negative for rash.  Neurological: Negative for tremors and headaches.  Endo/Heme/Allergies: Does not bruise/bleed easily.    Patient Active Problem List   Diagnosis Date Noted  . Incarcerated hernia 01/09/2019  . Palliative care encounter   . Small bowel obstruction due to adhesions ([place]) 12/29/2018  . Small bowel obstruction ([place]) 12/03/2018  . Partial small bowel obstruction ([place]) 11/01/2018  . SBO (small bowel obstruction) ([place]) 09/30/2018  . Iron deficiency anemia 07/03/2018  . Colostomy status ([place]) 07/03/2018  . Nausea without vomiting 11/10/2017  . Incisional hernia 09/22/2017  . Abdominal pain of multiple sites 09/22/2017  . Goals of care, counseling/discussion 04/02/2017  .  Prostate cancer metastatic to bone ([place]) 04/02/2017  . Elevated PSA 03/04/2017  . Osteopenia 11/03/2016  . Pelvic mass in male 09/04/2016  . 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 Exacerbation of Crohn's disease ([place]) 09/04/2016  . Family history of colon cancer 10/12/2014  . Protein-calorie malnutrition, severe ([place]) 07/27/2014  . Loss of weight 06/09/2014  . Crohn's disease  of both small and large intestine with complication ([place]) 86/76/1950  . Boils 02/11/2013  . Ventral hernia 12/11/2009  . Anemia 12/03/2009  . Regional enteritis/Crohn's 01/25/2007    Past Medical History: Past Medical History:  Diagnosis Date  . Abnormal finding of biliary tract    MRCP shows pancreatic/biliary tract dilation. EUS 2010 confirmed dilation but no chronic pancreaitis or mass. Vascular ectasia crimpoing distal CBD.   [name] Anxiety   . 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 Duodenal ulcer 2010   nsaids  . History of blood transfusion 1992   "related to colon OR"  . Peristomal hernia   . Prostate cancer ([place]) 2018  . SBO (small bowel obstruction) ([place]) 11/10/2018  . Small bowel obstruction ([place]) 10/2017; 11/21/2017; 02/16/2018  . Spigelian hernia    bilateral    Past Surgical History: Past Surgical History:  Procedure Laterality Date  . APPENDECTOMY  1992   at time of exp laparotomy at which time he was noted to have fistulizing Crohn's rather than UC  . COLON SURGERY    . COLONOSCOPY [name] 08/23/2014   DTO:IZTIWPY proctoscopy with possible fistulous opening in thebase of rectal/anal stump.    . COLOSTOMY  1992   transverse loop colostomy secondary to a stricture  . ESOPHAGOGASTRODUODENOSCOPY  05/2009   SLF: multiple antral erosions, large ulcer at ansatomosis (postsurgical changes at duodenal bulb and second portion of duodenum) BX c/x NSAIDS.  . EUS  10/04/2009   Dr. Estill Bakes dilated CBD and main pancreatic duct.  No pancreatic  . EXPLORATORY LAPAROTOMY  1992  . FLEXIBLE SIGMOIDOSCOPY  1988   Dr. Laural Golden- suggested rohn's  disease but the biopsies were not collaborative.  [name] FLEXIBLE SIGMOIDOSCOPY [name] 09/08/2016   Procedure: FLEXIBLE SIGMOIDOSCOPY;  Surgeon: Wonda Horner, MD;  Location: [facility] ENDOSCOPY;  Service: Gastroenterology;  Laterality: [name];  . HEMICOLOECTOMY W/ ANASTOMOSIS  1993   R- Dr.DeMason   . HERNIA REPAIR  1996   incarcerated periostial hernia with additional surgery in 1999  . IR FLUORO GUIDE PORT INSERTION RIGHT  04/03/2017  . IR US GUIDE VASC ACCESS RIGHT  04/03/2017    Social History: Social History   Tobacco Use  . Smoking status: Former Smoker    Packs/day: 0.75    Years: 32.00    Pack years: 24.00    Types: Cigarettes    Last attempt to quit: 12/21/2009    Years since quitting: 9.3  . Smokeless tobacco: Never Used  Substance Use Topics  . Alcohol use: No    Alcohol/week: 0.0 standard drinks    Comment: Former drinker  . Drug use: No   Additional social history: Lives with wife and 4 children  Please also refer to relevant sections of EMR.  Family History: Family History  Problem Relation Age of Onset  . Cancer Father  prostate   . Prostate cancer Father   . Colon cancer Father 80  . Hypertension Sister   . Cancer Sister   . Depression Sister   . Breast cancer Sister   . COPD Sister   . Aneurysm Brother        deceased, brain aneurysm    Allergies and Medications: Allergies  Allergen Reactions  . Penicillins Hives    Has patient had a PCN reaction causing immediate rash, facial/tongue/throat swelling, SOB or lightheadedness with hypotension: Yes Has patient had a PCN reaction causing severe rash involving mucus membranes or skin necrosis: No Has patient had a PCN reaction that required hospitalization: No Has patient had a PCN reaction occurring within the last 10 years: No If all of the above answers are "NO", then may proceed with Cephalosporin use.    Current Facility-Administered Medications on File Prior to Encounter  Medication Dose Route Frequency  Provider Last Rate Last Dose  . sodium chloride flush (NS) 0.9 % injection 10 mL  10 mL Intravenous PRN [name], MD   10 mL at 11/20/17 0630   Current Outpatient Medications on File Prior to Encounter  Medication Sig Dispense Refill  . Adalimumab (HUMIRA PEN) 40 MG/0.8ML PNKT Inject 1 Syringe into the skin once a week. (Patient taking differently: Inject 40 mg into the skin every Wednesday. ) 4 each 3  . albuterol (PROVENTIL HFA) 108 (90 Base) MCG/ACT inhaler Inhale 2 puffs into the lungs every 6 (six) hours as needed for wheezing or shortness of breath.    . budesonide (ENTOCORT EC) 3 MG 24 hr capsule TAKE 3 CAPSULES BY MOUTH EVERY DAY 90 capsule 2  . Calcium Carb-Cholecalciferol (CALCIUM 600-D PO) Take 2 tablets by mouth daily.    [name] dextromethorphan-guaiFENesin (MUCINEX DM) 30-600 MG 12hr tablet Take 1 tablet by mouth 2 (two) times daily. 20 tablet 0  . doxycycline (VIBRA-TABS) 100 MG tablet Take 1 tablet (100 mg total) by mouth 2 (two) times daily. 14 tablet 0  . ferrous sulfate 325 (65 FE) MG tablet Take 325 mg by mouth daily with breakfast.    . lidocaine-prilocaine (EMLA) cream Apply 1 application topically daily as needed (when accessing port).     . mirtazapine (REMERON) 15 MG tablet TAKE 1 TABLET BY MOUTH AT BEDTIME (Patient taking differently: Take 15 mg by mouth at bedtime. ) 90 tablet 1  . Multiple Vitamins-Minerals (CENTRUM SILVER 50+MEN) TABS Take 1 tablet by mouth daily with breakfast.    . oxyCODONE-acetaminophen (PERCOCET) 10-325 MG tablet Take 1 tablet by mouth every 8 (eight) hours as needed for up to 5 days for pain. 60 tablet 0  . SIMETHICONE PO Take 1-2 tablets by mouth as needed (gas/flatulence).     [name] 40 MG capsule TAKE 4 CAPSULES (160 MG TOTAL) BY MOUTH DAILY. 120 capsule 0    Objective: BP 119/76   Pulse 84   Temp 98.9 F (37.2 C) (Oral)   Resp 19   Ht 5' 4"  (1.626 m)   Wt 58.1 kg   SpO2 98%   BMI 21.97 kg/m   Physical Exam Vitals signs  reviewed.  Constitutional:      Appearance: He is not diaphoretic.     Comments: Cachectic appearing man.  Appears older than stated age.  Appears fatigued though interactive on exam.  HENT:     Mouth/Throat:     Mouth: Mucous membranes are moist.     Pharynx: Oropharynx is clear. No pharyngeal swelling  or oropharyngeal exudate.  Eyes:     Extraocular Movements: Extraocular movements intact.     Pupils: Pupils are equal, round, and reactive to light.  Cardiovascular:     Rate and Rhythm: Normal rate and regular rhythm.     Heart sounds: No murmur. No gallop.      Comments: S1 splitting over left sternal border Pulmonary:     Effort: Pulmonary effort is normal.     Breath sounds: Normal breath sounds. No wheezing, rhonchi or rales.     Comments: Transmitted upper airway sounds. Abdominal:     General: Bowel sounds are normal.     Hernia: A hernia is present.     Comments: Flat abdomen.  Left abdomen with large 15 X 10 cm soft mass, mobile, positive bowel sounds, nontender to palpation.  Large surgical scarring along midline.  Right lower abdomen with colostomy bag, healthy appearing mucosa, loose brown/yellow stool in bag.  Significant tenderness to palpation in right lower quadrant, increased tenderness along right midaxillary line.  Genitourinary:    Scrotum/Testes:        Right: Swelling not present.        Left: Swelling not present.  Skin:    General: Skin is warm and dry.  Neurological:     General: No focal deficit present.     Mental Status: He is alert and oriented to person, place, and time.  Psychiatric:        Mood and Affect: Mood normal.        Behavior: Behavior normal.      Labs and Imaging: CBC BMET  Recent Labs  Lab 04/24/19 1631  WBC 11.6*  HGB 11.9*  HCT 36.8*  PLT 442*   Recent Labs  Lab 04/24/19 1631  NA 137  K 3.7  CL 104  CO2 23  BUN 10  CREATININE 0.92  GLUCOSE 89  CALCIUM 8.8*     Ct Abdomen Pelvis W Contrast  Result Date:  04/24/2019 CLINICAL DATA:  High grade bowel obstruction, history of small-bowel obstruction, duodenal ulcer, prostate cancer, Crohn's disease, spigelian hernia EXAM: CT ABDOMEN AND PELVIS WITH CONTRAST TECHNIQUE: Multidetector CT imaging of the abdomen and pelvis was performed using the standard protocol following bolus administration of intravenous contrast. Sagittal and coronal MPR images reconstructed from axial data set. CONTRAST:  155m ISOVUE-300 IOPAMIDOL (ISOVUE-300) INJECTION 61% IV. No oral contrast. COMPARISON:  03/25/2019 FINDINGS: Lower chest: Minimal atelectasis at LEFT lung base. Hepatobiliary: Dependent gallstone within gallbladder. No gallbladder wall thickening. No focal hepatic abnormalities. Pancreas: Normal appearance Spleen: Normal appearance Adrenals/Urinary Tract: Adrenal glands normal appearance. Tiny RIGHT renal cyst. Kidneys and ureters normal appearance. Tiny dependent calculi within urinary bladder. Stomach/Bowel: Stomach unremarkable. Ostomy RIGHT lower quadrant likely ileostomy. Multiple small bowel loops and descending colon extend into a large LEFT spigelian hernia. Dilatation of colon proximal to the herniated segment. Question mild bowel wall thickening of the RIGHT colon. Appendix not visualized. Small bowel loops decompressed. A large gas and fluid collection in the mid abdomen at the level of the iliac crests appears represent a segment of colon rather than an abscess. Vascular/Lymphatic: Aorta normal caliber. Vascular structures patent. LEFT para-aortic adenopathy unchanged. Scattered normal and upper normal sized mesenteric lymph nodes RIGHT mid abdomen. Reproductive: Prostate gland enlarged, contiguous with abnormal soft tissue in the RIGHT pelvis which extends posterolaterally and abuts the rectum, stable since prior exams, overall in aggregate 6.0 x 4.2 x 5.1 cm, question local extension of prostate cancer versus post  therapy soft tissue changes. Other: Linear collection of  gas and fluid is identified in the anterior RIGHT upper quadrant, 5.7 x 0.8 cm image 32 questionably extending 5 cm length, does not appear to communicate with bowel, question small abscess. No additional free air or free fluid. LEFT spigelian hernia as noted above. Musculoskeletal: No acute osseous findings. IMPRESSION: LEFT lower quadrant ileostomy. Dilated colon proximal to herniation of the descending colon into a large LEFT spigelian hernia consistent with a degree of colonic obstruction. Additional herniation of small bowel loops into LEFT spigelian hernia without small bowel dilatation/obstruction. Thin gas and fluid collection in the RIGHT upper quadrant anterior to the RIGHT colon 5.8 x 0.8 cm extending questionably 5 cm length question small abscess collection, does not appear to communicate with bowel. Abnormal soft tissue in the RIGHT pelvis contiguous with the prostate gland, question prostate cancer with local extension versus posttherapy changes; this is not significantly changed since previous study, recommend correlation with PSA and potentially follow-up PET-CT. Electronically Signed   By: Lavonia Dana M.D.   On: 04/24/2019 18:42      Matilde Haymaker, MD 04/24/2019, 7:59 PM PGY-1, [facility] pager: [phone], text pages welcome  Resident Attestation  I saw and evaluated the patient, performing the key elements of the service.I personally performed or re-performed the history, physical exam, and medical decision making activities of this service and have verified that the service and findings are accurately documented in the resident's note.I developed the management plan that is described in the resident's note, and I agree with the content, with my edits above.   Harolyn Rutherford, DO [facility], PGY-2

## 2019-04-24 NOTE — ED Triage Notes (Signed)
Pt here for evaluation of decreased output in colostomy bag with central intermittent abdominal pain since last night. Reports nausea that started on arrival to ED.

## 2019-04-24 NOTE — ED Notes (Signed)
Two unsuccessful IV attempts.

## 2019-04-24 NOTE — ED Notes (Signed)
Report given to 6N RN. 

## 2019-04-24 NOTE — Consult Note (Signed)
Reason for Consult:Possible intra-abdominal abscess Referring Physician: Laithan Conchas is an 63 y.o. male.  HPI: Patient is a 63 year old male with a complicated abdominal history of Crohn's disease s/p multiple abdominal surgeries, now with a long-standing RLQ ileostomy and large left-sided incisional hernia at an old colostomy site presents with decreasing ostomy output over the last day.  Patient does have chronic pain secondary to Crohn's disease and metastatic prostate cancer. He was admitted to the hospital in January for similar symptoms and was dischargedto hospice. He has improved and has been transferred to palliative care rather than hospice. He has also been actively trying to decrease his narcotic intake as he has had issues with constipation.   He was also hospitalized in early April for similar partial SBO.  This resolved quickly and he was discharged on 03/28/19.  He states that over the last day he has noticed increased thickening of the ostomy output and slightly decreased amount of output.  He reports some abdominal pain and only one episode of vomiting.    Past Medical History:  Diagnosis Date  . Abnormal finding of biliary tract    MRCP shows pancreatic/biliary tract dilation. EUS 2010 confirmed dilation but no chronic pancreaitis or mass. Vascular ectasia crimpoing distal CBD.   [name] Anxiety   . 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 Duodenal ulcer 2010   nsaids  . History of blood transfusion 1992   "related to colon OR"  . Peristomal hernia   . Prostate cancer ([place]) 2018  . SBO (small bowel obstruction) ([place]) 11/10/2018   . Small bowel obstruction ([place]) 10/2017; 11/21/2017; 02/16/2018  . Spigelian hernia    bilateral    Past Surgical History:  Procedure Laterality Date  . APPENDECTOMY  1992   at time of exp laparotomy at which time he was noted to have fistulizing Crohn's rather than UC  . COLON SURGERY    . COLONOSCOPY [name] 08/23/2014   [id number] proctoscopy with possible fistulous opening in thebase of rectal/anal stump.    . COLOSTOMY  1992   transverse loop colostomy secondary to a stricture  . ESOPHAGOGASTRODUODENOSCOPY  05/2009   SLF: multiple antral erosions, large ulcer at ansatomosis (postsurgical changes at duodenal bulb and second portion of duodenum) BX c/x NSAIDS.  . EUS  10/04/2009   Dr. Estill Bakes dilated CBD and main pancreatic duct.  No pancreatic  . EXPLORATORY LAPAROTOMY  1992  . FLEXIBLE SIGMOIDOSCOPY  1988   Dr. Laural Golden- suggested rohn's disease but the biopsies were not collaborative.  [name] FLEXIBLE SIGMOIDOSCOPY [name] 09/08/2016   Procedure: FLEXIBLE SIGMOIDOSCOPY;  Surgeon: Wonda Horner, MD;  Location: [facility] ENDOSCOPY;  Service: Gastroenterology;  Laterality: [name];  . HEMICOLOECTOMY W/ ANASTOMOSIS  1993   R- Dr.DeMason   . HERNIA REPAIR  1996   incarcerated periostial hernia with additional surgery in 1999  . IR FLUORO GUIDE PORT INSERTION RIGHT  04/03/2017  . IR US GUIDE VASC ACCESS RIGHT  04/03/2017    Family History  Problem Relation Age of Onset  . Cancer Father        prostate   . Prostate cancer Father   . Colon cancer Father 29  . Hypertension Sister   .  Cancer Sister   . Depression Sister   . Breast cancer Sister   . COPD Sister   . Aneurysm Brother        deceased, brain aneurysm    Social History:  reports that he quit smoking about 9 years ago. His smoking use included cigarettes. He has a 24.00 pack-year smoking history. He has never used smokeless tobacco. He reports that he does not drink alcohol or use drugs.  Allergies:  Allergies  Allergen Reactions  .  Penicillins Hives    Has patient had a PCN reaction causing immediate rash, facial/tongue/throat swelling, SOB or lightheadedness with hypotension: Yes Has patient had a PCN reaction causing severe rash involving mucus membranes or skin necrosis: No Has patient had a PCN reaction that required hospitalization: No Has patient had a PCN reaction occurring within the last 10 years: No If all of the above answers are "NO", then may proceed with Cephalosporin use.     Medications:  Prior to Admission medications   Medication Sig Start Date End Date Taking? Authorizing Provider  Adalimumab (HUMIRA PEN) 40 MG/0.8ML PNKT Inject 1 Syringe into the skin once a week. Patient taking differently: Inject 40 mg into the skin every Wednesday.  09/01/18   Annitta Needs, NP  albuterol (PROVENTIL HFA) 108 (90 Base) MCG/ACT inhaler Inhale 2 puffs into the lungs every 6 (six) hours as needed for wheezing or shortness of breath.    [provider]  budesonide (ENTOCORT EC) 3 MG 24 hr capsule TAKE 3 CAPSULES BY MOUTH EVERY DAY 04/04/19   Mahala Menghini, PA-C  Calcium Carb-Cholecalciferol (CALCIUM 600-D PO) Take 2 tablets by mouth daily.    [provider]  dextromethorphan-guaiFENesin (MUCINEX DM) 30-600 MG 12hr tablet Take 1 tablet by mouth 2 (two) times daily. 03/28/19   Raylene Everts, MD  doxycycline (VIBRA-TABS) 100 MG tablet Take 1 tablet (100 mg total) by mouth 2 (two) times daily. 04/20/19   Alycia Rossetti, MD  ferrous sulfate 325 (65 FE) MG tablet Take 325 mg by mouth daily with breakfast.    [provider]  lidocaine-prilocaine (EMLA) cream Apply 1 application topically daily as needed (when accessing port).     [provider]  mirtazapine (REMERON) 15 MG tablet TAKE 1 TABLET BY MOUTH AT BEDTIME Patient taking differently: Take 15 mg by mouth at bedtime.  03/21/19   Freer, [name], MD  Multiple Vitamins-Minerals (CENTRUM SILVER 50+MEN) TABS Take 1 tablet by mouth  daily with breakfast.    [provider]  oxyCODONE-acetaminophen (PERCOCET) 10-325 MG tablet Take 1 tablet by mouth every 8 (eight) hours as needed for up to 5 days for pain. 04/20/19 04/25/19  Alycia Rossetti, MD  SIMETHICONE PO Take 1-2 tablets by mouth as needed (gas/flatulence).     [provider]  XTANDI 40 MG capsule TAKE 4 CAPSULES (160 MG TOTAL) BY MOUTH DAILY. 04/15/19   [name], MD     Results for orders placed or performed during the hospital encounter of 04/24/19 (from the past 48 hour(s))  Comprehensive metabolic panel     Status: Abnormal   Collection Time: 04/24/19  4:31 PM  Result Value Ref Range   Sodium 137 135 - 145 mmol/L   Potassium 3.7 3.5 - 5.1 mmol/L   Chloride 104 98 - 111 mmol/L   CO2 23 22 - 32 mmol/L   Glucose, Bld 89 70 - 99 mg/dL   BUN 10 8 - 23 mg/dL  Creatinine, Ser 0.92 0.61 - 1.24 mg/dL   Calcium 8.8 (L) 8.9 - 10.3 mg/dL   Total Protein 8.5 (H) 6.5 - 8.1 g/dL   Albumin 3.3 (L) 3.5 - 5.0 g/dL   AST 29 15 - 41 U/L   ALT 13 0 - 44 U/L   Alkaline Phosphatase 77 38 - 126 U/L   Total Bilirubin 0.8 0.3 - 1.2 mg/dL   GFR calc non Af Amer >60 >60 mL/min   GFR calc Af Amer >60 >60 mL/min   Anion gap 10 5 - 15    Comment: Performed at [facility] [address] [place], [place] [address]  Lipase, blood     Status: None   Collection Time: 04/24/19  4:31 PM  Result Value Ref Range   Lipase 24 11 - 51 U/L    Comment: Performed at [facility] [address], [place], [place] [address]  CBC WITH DIFFERENTIAL     Status: Abnormal   Collection Time: 04/24/19  4:31 PM  Result Value Ref Range   WBC 11.6 (H) 4.0 - 10.5 K/uL   RBC 3.79 (L) 4.22 - 5.81 MIL/uL   Hemoglobin 11.9 (L) 13.0 - 17.0 g/dL   HCT 36.8 (L) 39.0 - 52.0 %   MCV 97.1 80.0 - 100.0 fL   MCH 31.4 26.0 - 34.0 pg   MCHC 32.3 30.0 - 36.0 g/dL   RDW 14.1 11.5 - 15.5 %   Platelets 442 (H) 150 - 400 K/uL   nRBC 0.0 0.0 - 0.2 %   Neutrophils Relative % 68  %   Neutro Abs 7.9 (H) 1.7 - 7.7 K/uL   Lymphocytes Relative 20 %   Lymphs Abs 2.3 0.7 - 4.0 K/uL   Monocytes Relative 5 %   Monocytes Absolute 0.6 0.1 - 1.0 K/uL   Eosinophils Relative 6 %   Eosinophils Absolute 0.7 (H) 0.0 - 0.5 K/uL   Basophils Relative 1 %   Basophils Absolute 0.1 0.0 - 0.1 K/uL   Immature Granulocytes 0 %   Abs Immature Granulocytes 0.04 0.00 - 0.07 K/uL    Comment: Performed at [facility], [place] [address] [place], [place] [address]  Urinalysis, Routine w reflex microscopic     Status: Abnormal   Collection Time: 04/24/19  4:44 PM  Result Value Ref Range   Color, Urine YELLOW YELLOW   APPearance CLEAR CLEAR   Specific Gravity, Urine 1.026 1.005 - 1.030   pH 5.0 5.0 - 8.0   Glucose, UA NEGATIVE NEGATIVE mg/dL   Hgb urine dipstick NEGATIVE NEGATIVE   Bilirubin Urine NEGATIVE NEGATIVE   Ketones, ur 5 (A) NEGATIVE mg/dL   Protein, ur NEGATIVE NEGATIVE mg/dL   Nitrite NEGATIVE NEGATIVE   Leukocytes,Ua NEGATIVE NEGATIVE    Comment: Performed at [facility] [address], [place], [place] [address]    Ct Abdomen Pelvis W Contrast  Result Date: 04/24/2019 CLINICAL DATA:  High grade bowel obstruction, history of small-bowel obstruction, duodenal ulcer, prostate cancer, Crohn's disease, spigelian hernia EXAM: CT ABDOMEN AND PELVIS WITH CONTRAST TECHNIQUE: Multidetector CT imaging of the abdomen and pelvis was performed using the standard protocol following bolus administration of intravenous contrast. Sagittal and coronal MPR images reconstructed from axial data set. CONTRAST:  1106m ISOVUE-300 IOPAMIDOL (ISOVUE-300) INJECTION 61% IV. No oral contrast. COMPARISON:  03/25/2019 FINDINGS: Lower chest: Minimal atelectasis at LEFT lung base. Hepatobiliary: Dependent gallstone within gallbladder. No gallbladder wall thickening. No focal hepatic abnormalities.  Pancreas: Normal appearance Spleen: Normal appearance Adrenals/Urinary Tract: Adrenal glands normal  appearance. Tiny RIGHT renal cyst. Kidneys and ureters normal appearance. Tiny dependent calculi within urinary bladder. Stomach/Bowel: Stomach unremarkable. Ostomy RIGHT lower quadrant likely ileostomy. Multiple small bowel loops and descending colon extend into a large LEFT spigelian hernia. Dilatation of colon proximal to the herniated segment. Question mild bowel wall thickening of the RIGHT colon. Appendix not visualized. Small bowel loops decompressed. A large gas and fluid collection in the mid abdomen at the level of the iliac crests appears represent a segment of colon rather than an abscess. Vascular/Lymphatic: Aorta normal caliber. Vascular structures patent. LEFT para-aortic adenopathy unchanged. Scattered normal and upper normal sized mesenteric lymph nodes RIGHT mid abdomen. Reproductive: Prostate gland enlarged, contiguous with abnormal soft tissue in the RIGHT pelvis which extends posterolaterally and abuts the rectum, stable since prior exams, overall in aggregate 6.0 x 4.2 x 5.1 cm, question local extension of prostate cancer versus post therapy soft tissue changes. Other: Linear collection of gas and fluid is identified in the anterior RIGHT upper quadrant, 5.7 x 0.8 cm image 32 questionably extending 5 cm length, does not appear to communicate with bowel, question small abscess. No additional free air or free fluid. LEFT spigelian hernia as noted above. Musculoskeletal: No acute osseous findings. IMPRESSION: LEFT lower quadrant ileostomy. Dilated colon proximal to herniation of the descending colon into a large LEFT spigelian hernia consistent with a degree of colonic obstruction. Additional herniation of small bowel loops into LEFT spigelian hernia without small bowel dilatation/obstruction. Thin gas and fluid collection in the RIGHT upper quadrant anterior to the RIGHT colon 5.8 x 0.8 cm extending questionably 5 cm length question small abscess collection, does not appear to communicate with  bowel. Abnormal soft tissue in the RIGHT pelvis contiguous with the prostate gland, question prostate cancer with local extension versus posttherapy changes; this is not significantly changed since previous study, recommend correlation with PSA and potentially follow-up PET-CT. Electronically Signed   By: Lavonia Dana M.D.   On: 04/24/2019 18:42    Review of Systems  Constitutional: Negative for weight loss.  HENT: Negative for ear discharge, ear pain, hearing loss and tinnitus.   Eyes: Negative for blurred vision, double vision, photophobia and pain.  Respiratory: Negative for cough, sputum production and shortness of breath.   Cardiovascular: Negative for chest pain.  Gastrointestinal: Positive for abdominal pain, nausea and vomiting.  Genitourinary: Negative for dysuria, flank pain, frequency and urgency.  Musculoskeletal: Negative for back pain, falls, joint pain, myalgias and neck pain.  Neurological: Negative for dizziness, tingling, sensory change, focal weakness, loss of consciousness and headaches.  Endo/Heme/Allergies: Does not bruise/bleed easily.  Psychiatric/Behavioral: Negative for depression, memory loss and substance abuse. The patient is not nervous/anxious.    Blood pressure 116/74, pulse 85, temperature 98.9 F (37.2 C), temperature source Oral, resp. rate 15, height 5' 4"  (1.626 m), weight 58.1 kg, SpO2 96 %. Physical Exam Cachectic male  in NAD Eyes:  Pupils equal, round; sclera anicteric HENT:  Oral mucosa moist; good dentition  Neck:  No masses palpated, no thyromegaly Chest - right chest port - no sign of infection Lungs:  CTA bilaterally; normal respiratory effort CV:  Regular rate and rhythm; no murmurs; extremities well-perfused with no edema Abd:  +bowel sounds, soft, currently non-tender, non-distended;  RLQ ileostomy - pink, thick pasty stool in bag LLQ ventral hernia - large hernia sac - partially reducible; no peritoneal signs Skin:  Warm, dry; no sign  of  jaundice Psychiatric - alert and oriented x 4; calm mood and affect  Assessment/Plan: Crohns' disease Right lower quadrant ileostomy - no sign of small bowel obstruction proximal to ileostomy Left-sided ventral hernia at old colostomy site - colon is chronically incarcerated.  This is not really clinically relevant, since he has an ostomy proximal to the colon. The questionable fluid/ gas collection in the RUQ does not appeared thick-walled like an abscess and is too thin for aspiration.     Recs:   IV hydration IV antibiotics  No need for NG tube NPO x sips and ice chips  Patient will eventually need referral to a tertiary referral center for management and possible surgery.  This decision may be complicated by his metastatic cancer status.  Imogene Burn Bonnie Roig 04/24/2019, 8:51 PM

## 2019-04-24 NOTE — ED Notes (Signed)
ED TO INPATIENT HANDOFF REPORT  ED Nurse Name and Phone #: Alford Highland RN [phone]  S Name/Age/Gender Daniel Howard 63 y.o. male Room/Bed: 038C/038C  Code Status   Code Status: DNR  Home/SNF/Other Home Patient oriented to: self, place, time and situation Is this baseline? Yes   Triage Complete: Triage complete  Chief Complaint Bowel obstruction?  Triage Note Pt here for evaluation of decreased output in colostomy bag with central intermittent abdominal pain since last night. Reports nausea that started on arrival to ED.    Allergies Allergies  Allergen Reactions  . Penicillins Hives    Has patient had a PCN reaction causing immediate rash, facial/tongue/throat swelling, SOB or lightheadedness with hypotension: Yes Has patient had a PCN reaction causing severe rash involving mucus membranes or skin necrosis: No Has patient had a PCN reaction that required hospitalization: No Has patient had a PCN reaction occurring within the last 10 years: No If all of the above answers are "NO", then may proceed with Cephalosporin use.     Level of Care/Admitting Diagnosis ED Disposition    ED Disposition Condition Comment   Admit  Hospital Area: [facility] [[id number]]  Level of Care: Med-Surg [16]  Covid Evaluation: Person Under Investigation (PUI)  Isolation Risk Level: Low Risk/Droplet (Less than 4L [place] supplementation)  Diagnosis: SBO (small bowel obstruction) [facility]) [[id number]]  Admitting Physician: Desma Mcgregor  Attending Physician: Mingo Amber, JEFFREY H [[id number]]  PT Class (Do Not Modify): Observation [104]  PT Acc Code (Do Not Modify): Observation [10022]       B Medical/Surgery History Past Medical History:  Diagnosis Date  . Abnormal finding of biliary tract    MRCP shows pancreatic/biliary tract dilation. EUS 2010 confirmed dilation but no chronic pancreaitis or mass. Vascular ectasia crimpoing distal CBD.   [name] Anxiety   . 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 Duodenal ulcer 2010   nsaids  . History of blood transfusion 1992   "related to colon OR"  . Peristomal hernia   . Prostate cancer ([place]) 2018  . SBO (small bowel obstruction) ([place]) 11/10/2018  . Small bowel obstruction ([place]) 10/2017; 11/21/2017; 02/16/2018  . Spigelian hernia    bilateral   Past Surgical History:  Procedure Laterality Date  . APPENDECTOMY  1992   at time of exp laparotomy at which time he was noted to have fistulizing Crohn's rather than UC  . COLON SURGERY    . COLONOSCOPY [name] 08/23/2014   [id number] proctoscopy with possible fistulous opening in thebase of rectal/anal stump.    . COLOSTOMY  1992   transverse loop colostomy secondary to a stricture  . ESOPHAGOGASTRODUODENOSCOPY  05/2009   SLF: multiple antral erosions, large ulcer at ansatomosis (postsurgical changes at duodenal bulb and second portion of duodenum) BX c/x NSAIDS.  . EUS  10/04/2009   Dr. Estill Bakes dilated CBD and main pancreatic duct.  No pancreatic  . EXPLORATORY LAPAROTOMY  1992  . FLEXIBLE SIGMOIDOSCOPY  1988   Dr. Laural Golden- suggested rohn's disease but the biopsies were not collaborative.  [name] FLEXIBLE SIGMOIDOSCOPY [name] 09/08/2016   Procedure: FLEXIBLE SIGMOIDOSCOPY;  Surgeon: Wonda Horner, MD;  Location: [facility] ENDOSCOPY;  Service: Gastroenterology;  Laterality: [name];  . HEMICOLOECTOMY W/ ANASTOMOSIS  1993   R- Dr.DeMason   . HERNIA REPAIR  1996   incarcerated periostial hernia with additional surgery in 1999  . IR FLUORO GUIDE PORT INSERTION RIGHT  04/03/2017  . IR US GUIDE VASC ACCESS RIGHT  04/03/2017     A IV  Location/Drains/Wounds Patient Lines/Drains/Airways Status   Active Line/Drains/Airways    Name:   Placement date:   Placement time:   Site:   Days:   Implanted Port Right Chest   -    -    Chest      Peripheral IV 04/24/19 Right Arm   04/24/19    1630    Arm   less than 1   Ileostomy RUQ   07/26/14    2348    RUQ   1733   Ileostomy Standard (end) RUQ   01/23/91    1200    RUQ   10318          Intake/Output Last 24 hours No intake or output data in the 24 hours ending 04/24/19 2127  Labs/Imaging Results for orders placed or performed during the hospital encounter of 04/24/19 (from the past 48 hour(s))  Comprehensive metabolic panel     Status: Abnormal   Collection Time: 04/24/19  4:31 PM  Result Value Ref Range   Sodium 137 135 - 145 mmol/L   Potassium 3.7 3.5 - 5.1 mmol/L   Chloride 104 98 - 111 mmol/L   CO2 23 22 - 32 mmol/L   Glucose, Bld 89 70 - 99 mg/dL   BUN 10 8 - 23 mg/dL   Creatinine, Ser 0.92 0.61 - 1.24 mg/dL   Calcium 8.8 (L) 8.9 - 10.3 mg/dL   Total Protein 8.5 (H) 6.5 - 8.1 g/dL   Albumin 3.3 (L) 3.5 - 5.0 g/dL   AST 29 15 - 41 U/L   ALT 13 0 - 44 U/L   Alkaline Phosphatase 77 38 - 126 U/L   Total Bilirubin 0.8 0.3 - 1.2 mg/dL   GFR calc non Af Amer >60 >60 mL/min   GFR calc Af Amer >60 >60 mL/min   Anion gap 10 5 - 15    Comment: Performed at [facility], [address], [place], [place] [address]  Lipase, blood     Status: None   Collection Time: 04/24/19  4:31 PM  Result Value Ref Range   Lipase 24 11 - 51 U/L    Comment: Performed at [facility] [address], [place], [place] [address]  CBC WITH DIFFERENTIAL     Status: Abnormal   Collection Time: 04/24/19  4:31 PM  Result Value Ref Range   WBC 11.6 (H) 4.0 - 10.5 K/uL   RBC 3.79 (L) 4.22 - 5.81 MIL/uL   Hemoglobin 11.9 (L) 13.0 - 17.0 g/dL   HCT 36.8 (L) 39.0 - 52.0 %   MCV 97.1 80.0 - 100.0 fL   MCH 31.4 26.0 - 34.0 pg   MCHC 32.3 30.0 - 36.0 g/dL   RDW 14.1 11.5 - 15.5 %    Platelets 442 (H) 150 - 400 K/uL   nRBC 0.0 0.0 - 0.2 %   Neutrophils Relative % 68 %   Neutro Abs 7.9 (H) 1.7 - 7.7 K/uL   Lymphocytes Relative 20 %   Lymphs Abs 2.3 0.7 - 4.0 K/uL   Monocytes Relative 5 %   Monocytes Absolute 0.6 0.1 - 1.0 K/uL  Eosinophils Relative 6 %   Eosinophils Absolute 0.7 (H) 0.0 - 0.5 K/uL   Basophils Relative 1 %   Basophils Absolute 0.1 0.0 - 0.1 K/uL   Immature Granulocytes 0 %   Abs Immature Granulocytes 0.04 0.00 - 0.07 K/uL    Comment: Performed at [facility] [address] [place], [place] [address]  Urinalysis, Routine w reflex microscopic     Status: Abnormal   Collection Time: 04/24/19  4:44 PM  Result Value Ref Range   Color, Urine YELLOW YELLOW   APPearance CLEAR CLEAR   Specific Gravity, Urine 1.026 1.005 - 1.030   pH 5.0 5.0 - 8.0   Glucose, UA NEGATIVE NEGATIVE mg/dL   Hgb urine dipstick NEGATIVE NEGATIVE   Bilirubin Urine NEGATIVE NEGATIVE   Ketones, ur 5 (A) NEGATIVE mg/dL   Protein, ur NEGATIVE NEGATIVE mg/dL   Nitrite NEGATIVE NEGATIVE   Leukocytes,Ua NEGATIVE NEGATIVE    Comment: Performed at [facility] [address] [place], [place] [address]   Ct Abdomen Pelvis W Contrast  Result Date: 04/24/2019 CLINICAL DATA:  High grade bowel obstruction, history of small-bowel obstruction, duodenal ulcer, prostate cancer, Crohn's disease, spigelian hernia EXAM: CT ABDOMEN AND PELVIS WITH CONTRAST TECHNIQUE: Multidetector CT imaging of the abdomen and pelvis was performed using the standard protocol following bolus administration of intravenous contrast. Sagittal and coronal MPR images reconstructed from axial data set. CONTRAST:  158m ISOVUE-300 IOPAMIDOL (ISOVUE-300) INJECTION 61% IV. No oral contrast. COMPARISON:  03/25/2019 FINDINGS: Lower chest: Minimal atelectasis at LEFT lung base. Hepatobiliary: Dependent gallstone within gallbladder. No gallbladder wall thickening. No focal hepatic abnormalities. Pancreas: Normal  appearance Spleen: Normal appearance Adrenals/Urinary Tract: Adrenal glands normal appearance. Tiny RIGHT renal cyst. Kidneys and ureters normal appearance. Tiny dependent calculi within urinary bladder. Stomach/Bowel: Stomach unremarkable. Ostomy RIGHT lower quadrant likely ileostomy. Multiple small bowel loops and descending colon extend into a large LEFT spigelian hernia. Dilatation of colon proximal to the herniated segment. Question mild bowel wall thickening of the RIGHT colon. Appendix not visualized. Small bowel loops decompressed. A large gas and fluid collection in the mid abdomen at the level of the iliac crests appears represent a segment of colon rather than an abscess. Vascular/Lymphatic: Aorta normal caliber. Vascular structures patent. LEFT para-aortic adenopathy unchanged. Scattered normal and upper normal sized mesenteric lymph nodes RIGHT mid abdomen. Reproductive: Prostate gland enlarged, contiguous with abnormal soft tissue in the RIGHT pelvis which extends posterolaterally and abuts the rectum, stable since prior exams, overall in aggregate 6.0 x 4.2 x 5.1 cm, question local extension of prostate cancer versus post therapy soft tissue changes. Other: Linear collection of gas and fluid is identified in the anterior RIGHT upper quadrant, 5.7 x 0.8 cm image 32 questionably extending 5 cm length, does not appear to communicate with bowel, question small abscess. No additional free air or free fluid. LEFT spigelian hernia as noted above. Musculoskeletal: No acute osseous findings. IMPRESSION: LEFT lower quadrant ileostomy. Dilated colon proximal to herniation of the descending colon into a large LEFT spigelian hernia consistent with a degree of colonic obstruction. Additional herniation of small bowel loops into LEFT spigelian hernia without small bowel dilatation/obstruction. Thin gas and fluid collection in the RIGHT upper quadrant anterior to the RIGHT colon 5.8 x 0.8 cm extending questionably 5  cm length question small abscess collection, does not appear to communicate with bowel. Abnormal soft tissue in the RIGHT pelvis contiguous with the prostate gland, question prostate cancer with local extension  versus posttherapy changes; this is not significantly changed since previous study, recommend correlation with PSA and potentially follow-up PET-CT. Electronically Signed   By: Lavonia Dana M.D.   On: 04/24/2019 18:42    Pending Labs Unresulted Labs (From admission, onward)    Start     Ordered   04/25/19 5797  Basic metabolic panel  Daily,   R     04/24/19 2057   04/25/19 0500  CBC  Daily,   R     04/24/19 2057   04/24/19 1858  SARS Coronavirus 2 (CEPHEID - Performed in [facility] [facility] hospital lab), Hosp Order  (Asymptomatic Patients Labs with Precautions )  Once,   R    Question:  Rule Out  Answer:  Yes   04/24/19 1857          Vitals/Pain Today's Vitals   04/24/19 2030 04/24/19 2051 04/24/19 2051 04/24/19 2100  BP: 132/87   114/72  Pulse: 82   88  Resp: 16   16  Temp:      TempSrc:      SpO2: 98%   97%  Weight:      Height:      PainSc:  7  7      Isolation Precautions No active isolations  Medications Medications  metroNIDAZOLE (FLAGYL) IVPB 500 mg (500 mg Intravenous New Bag/Given 04/24/19 2122)  0.9 %  sodium chloride infusion (has no administration in time range)  albuterol (VENTOLIN HFA) 108 (90 Base) MCG/ACT inhaler 2 puff (has no administration in time range)  lidocaine-prilocaine (EMLA) cream 1 application (has no administration in time range)  enoxaparin (LOVENOX) injection 40 mg (has no administration in time range)  acetaminophen (TYLENOL) suppository 650 mg (has no administration in time range)  morphine 2 MG/ML injection 2 mg (has no administration in time range)  fentaNYL (SUBLIMAZE) injection 50 mcg (50 mcg Intravenous Given 04/24/19 1636)  ondansetron (ZOFRAN) injection 4 mg (4 mg Intravenous Given 04/24/19 1634)  iopamidol (ISOVUE-300) 61 % injection 100  mL (100 mLs Intravenous Contrast Given 04/24/19 1812)  ciprofloxacin (CIPRO) IVPB 400 mg (0 mg Intravenous Stopped 04/24/19 2103)    Mobility walks Low fall risk   Focused Assessments GI assessment    R Recommendations: See Admitting Provider Note  Report given to:   Additional Notes:  Pt with cancer history and decreased output in colostomy bag; pain currently 7/10, awaiting morphine verification via pharmacy.

## 2019-04-24 NOTE — ED Notes (Signed)
Patient transported to CT 

## 2019-04-24 NOTE — ED Provider Notes (Signed)
Care assumed from Dr. Vanita Panda at sign over of patient, please see their notes for full documentation of patient's complaint/HPI. Briefly, pt here with abd pain, decreased colostomy output, and nausea x1 day and vomited on arrival to ED, hx of SBO most recently admitted on 03/25/19 for that, hx of extensive abdominal surgeries. Awaiting labs and CT imaging. Plan is to reassess once work up is completed.   Physical Exam  BP 140/83 (BP Location: Right Arm)   Pulse 84   Temp 98.9 F (37.2 C) (Oral)   Resp 16   Ht 5' 4"  (1.626 m)   Wt 58.1 kg   SpO2 99%   BMI 21.97 kg/m   Physical Exam Gen: afebrile, VSS, NAD, thin elderly appearing male HEENT: EOMI Resp: no resp distress CV: rate WNL Abd: multiple scars to abdomen, colostomy in RLQ with yellowish brown stool in bag, pink stoma, no surrounding skin changes; large hernia to L lateral abdomen with distension noted but abd soft, with mild TTP to suprapubic and RLQ areas, no tenderness to the distended area in the L abdomen, somewhat hypoactive BS in the R abdomen but fairly normoactive in the left abdomen (within the distended area), no r/g/r, no CVA TTP  MsK: moving all extremities with ease Neuro: A&O  ED Course/Procedures    Results for orders placed or performed during the hospital encounter of 04/24/19  Comprehensive metabolic panel  Result Value Ref Range   Sodium 137 135 - 145 mmol/L   Potassium 3.7 3.5 - 5.1 mmol/L   Chloride 104 98 - 111 mmol/L   CO2 23 22 - 32 mmol/L   Glucose, Bld 89 70 - 99 mg/dL   BUN 10 8 - 23 mg/dL   Creatinine, Ser 0.92 0.61 - 1.24 mg/dL   Calcium 8.8 (L) 8.9 - 10.3 mg/dL   Total Protein 8.5 (H) 6.5 - 8.1 g/dL   Albumin 3.3 (L) 3.5 - 5.0 g/dL   AST 29 15 - 41 U/L   ALT 13 0 - 44 U/L   Alkaline Phosphatase 77 38 - 126 U/L   Total Bilirubin 0.8 0.3 - 1.2 mg/dL   GFR calc non Af Amer >60 >60 mL/min   GFR calc Af Amer >60 >60 mL/min   Anion gap 10 5 - 15  Lipase, blood  Result Value Ref Range   Lipase 24 11 - 51 U/L  CBC WITH DIFFERENTIAL  Result Value Ref Range   WBC 11.6 (H) 4.0 - 10.5 K/uL   RBC 3.79 (L) 4.22 - 5.81 MIL/uL   Hemoglobin 11.9 (L) 13.0 - 17.0 g/dL   HCT 36.8 (L) 39.0 - 52.0 %   MCV 97.1 80.0 - 100.0 fL   MCH 31.4 26.0 - 34.0 pg   MCHC 32.3 30.0 - 36.0 g/dL   RDW 14.1 11.5 - 15.5 %   Platelets 442 (H) 150 - 400 K/uL   nRBC 0.0 0.0 - 0.2 %   Neutrophils Relative % 68 %   Neutro Abs 7.9 (H) 1.7 - 7.7 K/uL   Lymphocytes Relative 20 %   Lymphs Abs 2.3 0.7 - 4.0 K/uL   Monocytes Relative 5 %   Monocytes Absolute 0.6 0.1 - 1.0 K/uL   Eosinophils Relative 6 %   Eosinophils Absolute 0.7 (H) 0.0 - 0.5 K/uL   Basophils Relative 1 %   Basophils Absolute 0.1 0.0 - 0.1 K/uL   Immature Granulocytes 0 %   Abs Immature Granulocytes 0.04 0.00 - 0.07 K/uL  Urinalysis, Routine  w reflex microscopic  Result Value Ref Range   Color, Urine YELLOW YELLOW   APPearance CLEAR CLEAR   Specific Gravity, Urine 1.026 1.005 - 1.030   pH 5.0 5.0 - 8.0   Glucose, UA NEGATIVE NEGATIVE mg/dL   Hgb urine dipstick NEGATIVE NEGATIVE   Bilirubin Urine NEGATIVE NEGATIVE   Ketones, ur 5 (A) NEGATIVE mg/dL   Protein, ur NEGATIVE NEGATIVE mg/dL   Nitrite NEGATIVE NEGATIVE   Leukocytes,Ua NEGATIVE NEGATIVE    Ct Abdomen Pelvis W Contrast  Result Date: 04/24/2019 CLINICAL DATA:  High grade bowel obstruction, history of small-bowel obstruction, duodenal ulcer, prostate cancer, Crohn's disease, spigelian hernia EXAM: CT ABDOMEN AND PELVIS WITH CONTRAST TECHNIQUE: Multidetector CT imaging of the abdomen and pelvis was performed using the standard protocol following bolus administration of intravenous contrast. Sagittal and coronal MPR images reconstructed from axial data set. CONTRAST:  142m ISOVUE-300 IOPAMIDOL (ISOVUE-300) INJECTION 61% IV. No oral contrast. COMPARISON:  03/25/2019 FINDINGS: Lower chest: Minimal atelectasis at LEFT lung base. Hepatobiliary: Dependent gallstone within  gallbladder. No gallbladder wall thickening. No focal hepatic abnormalities. Pancreas: Normal appearance Spleen: Normal appearance Adrenals/Urinary Tract: Adrenal glands normal appearance. Tiny RIGHT renal cyst. Kidneys and ureters normal appearance. Tiny dependent calculi within urinary bladder. Stomach/Bowel: Stomach unremarkable. Ostomy RIGHT lower quadrant likely ileostomy. Multiple small bowel loops and descending colon extend into a large LEFT spigelian hernia. Dilatation of colon proximal to the herniated segment. Question mild bowel wall thickening of the RIGHT colon. Appendix not visualized. Small bowel loops decompressed. A large gas and fluid collection in the mid abdomen at the level of the iliac crests appears represent a segment of colon rather than an abscess. Vascular/Lymphatic: Aorta normal caliber. Vascular structures patent. LEFT para-aortic adenopathy unchanged. Scattered normal and upper normal sized mesenteric lymph nodes RIGHT mid abdomen. Reproductive: Prostate gland enlarged, contiguous with abnormal soft tissue in the RIGHT pelvis which extends posterolaterally and abuts the rectum, stable since prior exams, overall in aggregate 6.0 x 4.2 x 5.1 cm, question local extension of prostate cancer versus post therapy soft tissue changes. Other: Linear collection of gas and fluid is identified in the anterior RIGHT upper quadrant, 5.7 x 0.8 cm image 32 questionably extending 5 cm length, does not appear to communicate with bowel, question small abscess. No additional free air or free fluid. LEFT spigelian hernia as noted above. Musculoskeletal: No acute osseous findings. IMPRESSION: LEFT lower quadrant ileostomy. Dilated colon proximal to herniation of the descending colon into a large LEFT spigelian hernia consistent with a degree of colonic obstruction. Additional herniation of small bowel loops into LEFT spigelian hernia without small bowel dilatation/obstruction. Thin gas and fluid collection  in the RIGHT upper quadrant anterior to the RIGHT colon 5.8 x 0.8 cm extending questionably 5 cm length question small abscess collection, does not appear to communicate with bowel. Abnormal soft tissue in the RIGHT pelvis contiguous with the prostate gland, question prostate cancer with local extension versus posttherapy changes; this is not significantly changed since previous study, recommend correlation with PSA and potentially follow-up PET-CT. Electronically Signed   By: MLavonia DanaM.D.   On: 04/24/2019 18:42       Meds ordered this encounter  Medications  . 0.9 %  sodium chloride infusion  . fentaNYL (SUBLIMAZE) injection 50 mcg  . ondansetron (ZOFRAN) injection 4 mg  . iopamidol (ISOVUE-300) 61 % injection 100 mL  . ciprofloxacin (CIPRO) IVPB 400 mg    Order Specific Question:   Antibiotic Indication:  Answer:   Intra-abdominal Infection  . metroNIDAZOLE (FLAGYL) IVPB 500 mg    Order Specific Question:   Antibiotic Indication:    Answer:   Intra-abdominal Infection     MDM:   ICD-10-CM   1. Intra-abdominal abscess ([place]) K65.1   2. Abdominal pain, unspecified abdominal location R10.9   3. Nausea and vomiting in adult patient R11.2   4. Leukocytosis, unspecified type D72.829     7:03 PM CBC w/diff with mild leukocytosis WBC 11.6, mild chronic stable anemia, mildly elevated plt 442 similar to prior. CMP fairly unremarkable. Lipase WNL. U/A without evidence of UTI. CT abd/pelv showing dilated colon proximal to herniation of descending colon into large left spigelian hernia c/w degree of colonic obstruction, additional herniation of small bowel loops into hernia without dilation/obstruction, thin gas and fluid collection in RUQ anterior to R colon question small abscess collection, doesn't appear to communicate with bowel. Will proceed with abx and admission. Discussed case with my attending Dr. Vanita Panda who agrees with plan.   7:51 PM Dr. Pilar Plate of [facility] residency service  returning page and will admit. Spoke with Dr. Georgette Dover of gen surgery who will consult on patient as well. Holding orders to be placed by admitting team. Please see their notes for further documentation of care. I appreciate their help with this pleasant pt's care. Pt stable at time of admission.      [address] [place], [place] 04/24/19 1951    Carmin Muskrat, MD 04/26/19 Benancio Deeds

## 2019-04-25 ENCOUNTER — Encounter (HOSPITAL_COMMUNITY): Payer: Self-pay | Admitting: *Deleted

## 2019-04-25 ENCOUNTER — Observation Stay (HOSPITAL_COMMUNITY): Payer: Medicare Other

## 2019-04-25 DIAGNOSIS — K56609 Unspecified intestinal obstruction, unspecified as to partial versus complete obstruction: Secondary | ICD-10-CM | POA: Diagnosis present

## 2019-04-25 DIAGNOSIS — Z79899 Other long term (current) drug therapy: Secondary | ICD-10-CM | POA: Diagnosis not present

## 2019-04-25 DIAGNOSIS — K439 Ventral hernia without obstruction or gangrene: Secondary | ICD-10-CM | POA: Diagnosis not present

## 2019-04-25 DIAGNOSIS — K5903 Drug induced constipation: Secondary | ICD-10-CM | POA: Diagnosis present

## 2019-04-25 DIAGNOSIS — Z932 Ileostomy status: Secondary | ICD-10-CM | POA: Diagnosis not present

## 2019-04-25 DIAGNOSIS — C61 Malignant neoplasm of prostate: Secondary | ICD-10-CM | POA: Diagnosis present

## 2019-04-25 DIAGNOSIS — E440 Moderate protein-calorie malnutrition: Secondary | ICD-10-CM | POA: Diagnosis present

## 2019-04-25 DIAGNOSIS — Z8042 Family history of malignant neoplasm of prostate: Secondary | ICD-10-CM | POA: Diagnosis not present

## 2019-04-25 DIAGNOSIS — K651 Peritoneal abscess: Secondary | ICD-10-CM | POA: Diagnosis not present

## 2019-04-25 DIAGNOSIS — K5090 Crohn's disease, unspecified, without complications: Secondary | ICD-10-CM | POA: Diagnosis present

## 2019-04-25 DIAGNOSIS — Z66 Do not resuscitate: Secondary | ICD-10-CM | POA: Diagnosis present

## 2019-04-25 DIAGNOSIS — Z87891 Personal history of nicotine dependence: Secondary | ICD-10-CM | POA: Diagnosis not present

## 2019-04-25 DIAGNOSIS — K6389 Other specified diseases of intestine: Secondary | ICD-10-CM | POA: Diagnosis not present

## 2019-04-25 DIAGNOSIS — T40605A Adverse effect of unspecified narcotics, initial encounter: Secondary | ICD-10-CM | POA: Diagnosis present

## 2019-04-25 DIAGNOSIS — Z933 Colostomy status: Secondary | ICD-10-CM | POA: Diagnosis not present

## 2019-04-25 DIAGNOSIS — Z6820 Body mass index (BMI) 20.0-20.9, adult: Secondary | ICD-10-CM | POA: Diagnosis not present

## 2019-04-25 DIAGNOSIS — K435 Parastomal hernia without obstruction or  gangrene: Secondary | ICD-10-CM | POA: Diagnosis present

## 2019-04-25 DIAGNOSIS — G8929 Other chronic pain: Secondary | ICD-10-CM | POA: Diagnosis present

## 2019-04-25 DIAGNOSIS — Z7952 Long term (current) use of systemic steroids: Secondary | ICD-10-CM | POA: Diagnosis not present

## 2019-04-25 DIAGNOSIS — K50919 Crohn's disease, unspecified, with unspecified complications: Secondary | ICD-10-CM | POA: Diagnosis not present

## 2019-04-25 DIAGNOSIS — Z800 Family history of malignant neoplasm of digestive organs: Secondary | ICD-10-CM | POA: Diagnosis not present

## 2019-04-25 DIAGNOSIS — Z1159 Encounter for screening for other viral diseases: Secondary | ICD-10-CM | POA: Diagnosis not present

## 2019-04-25 DIAGNOSIS — C7951 Secondary malignant neoplasm of bone: Secondary | ICD-10-CM | POA: Diagnosis present

## 2019-04-25 DIAGNOSIS — J449 Chronic obstructive pulmonary disease, unspecified: Secondary | ICD-10-CM | POA: Diagnosis present

## 2019-04-25 DIAGNOSIS — K432 Incisional hernia without obstruction or gangrene: Secondary | ICD-10-CM | POA: Diagnosis present

## 2019-04-25 DIAGNOSIS — D649 Anemia, unspecified: Secondary | ICD-10-CM | POA: Diagnosis present

## 2019-04-25 DIAGNOSIS — R112 Nausea with vomiting, unspecified: Secondary | ICD-10-CM | POA: Diagnosis present

## 2019-04-25 LAB — BASIC METABOLIC PANEL
Anion gap: 7 (ref 5–15)
BUN: 8 mg/dL (ref 8–23)
CO2: 25 mmol/L (ref 22–32)
Calcium: 7.7 mg/dL — ABNORMAL LOW (ref 8.9–10.3)
Chloride: 105 mmol/L (ref 98–111)
Creatinine, Ser: 1 mg/dL (ref 0.61–1.24)
GFR calc Af Amer: >60 mL/min (ref >60)
GFR calc non Af Amer: >60 mL/min (ref >60)
Glucose, Bld: 87 mg/dL (ref 70–99)
Potassium: 4 mmol/L (ref 3.5–5.1)
Sodium: 137 mmol/L (ref 135–145)

## 2019-04-25 LAB — CBC
HCT: 30.2 % — ABNORMAL LOW (ref 39.0–52.0)
Hemoglobin: 9.8 g/dL — ABNORMAL LOW (ref 13.0–17.0)
MCH: 31.2 pg (ref 26.0–34.0)
MCHC: 32.5 g/dL (ref 30.0–36.0)
MCV: 96.2 fL (ref 80.0–100.0)
Platelets: 402 10*3/uL — ABNORMAL HIGH (ref 150–400)
RBC: 3.14 MIL/uL — ABNORMAL LOW (ref 4.22–5.81)
RDW: 14.1 % (ref 11.5–15.5)
WBC: 10.7 10*3/uL — ABNORMAL HIGH (ref 4.0–10.5)
nRBC: 0 % (ref 0.0–0.2)

## 2019-04-25 MED ORDER — OXYCODONE-ACETAMINOPHEN 10-325 MG PO TABS: 1.0000 | ORAL_TABLET | Freq: Three times a day (TID) | ORAL | Status: DC | PRN

## 2019-04-25 MED ORDER — OXYCODONE HCL 5 MG PO TABS
5.0000 mg | ORAL_TABLET | Freq: Three times a day (TID) | ORAL | Status: DC | PRN
  Administered 2019-04-26: 5 mg via ORAL
  Filled 2019-04-25: qty 1

## 2019-04-25 MED ORDER — OXYCODONE-ACETAMINOPHEN 5-325 MG PO TABS
1.0000 | ORAL_TABLET | Freq: Three times a day (TID) | ORAL | Status: DC | PRN
  Administered 2019-04-26: 1 via ORAL
  Filled 2019-04-25: qty 1

## 2019-04-25 NOTE — Care Management Obs Status (Signed)
[facility] NOTIFICATION   Patient Details  Name: Daniel Howard MRN: [id number] Date of Birth: 29-Mar-1956   Medicare Observation Status Notification Given:  Yes    Marilu Favre, RN 04/25/2019, 12:35 PM

## 2019-04-25 NOTE — Progress Notes (Signed)
[facility] Progress Note     Subjective: CC-  Patient states that he's feeling much better. Denies any current abdominal pain. Denies n/v. Feels hungry. Stool and air per ileostomy.  WBC trending down, VSS.  Objective: Vital signs in last 24 hours: Temp:  [98.6 F (37 C)-98.9 F (37.2 C)] 98.7 F (37.1 C) (05/04 0535) Pulse Rate:  [77-89] 78 (05/04 0535) Resp:  [14-19] 14 (05/04 0535) BP: (104-140)/(66-87) 104/72 (05/04 0535) SpO2:  [96 %-100 %] 100 % (05/04 0535) Weight:  [53.1 kg-58.1 kg] 53.1 kg (05/03 2207)    Intake/Output from previous day: 05/03 0701 - 05/04 0700 In: 397.6 [I.V.:397.6] Out: 200 [Urine:200] Intake/Output this shift: No intake/output data recorded.  PE: Gen:  Alert, NAD, pleasant HEENT: EOM's intact, pupils equal and round Card:  RRR Pulm:  CTAB, no W/R/R, effort normal Abd: Soft, NT/ND, +BS, well healed midline incision, ostomy pink with air and stool in pouch Skin: no rashes noted, warm and dry  Lab Results:  Recent Labs    04/24/19 1631 04/25/19 0301  WBC 11.6* 10.7*  HGB 11.9* 9.8*  HCT 36.8* 30.2*  PLT 442* 402*   BMET Recent Labs    04/24/19 1631 04/25/19 0301  NA 137 137  K 3.7 4.0  CL 104 105  CO2 23 25  GLUCOSE 89 87  BUN 10 8  CREATININE 0.92 1.00  CALCIUM 8.8* 7.7*   PT/INR No results for input(s): LABPROT, INR in the last 72 hours. CMP     Component Value Date/Time   NA 137 04/25/2019 0301   NA 137 11/20/2017 0931   K 4.0 04/25/2019 0301   K 3.3 (L) 11/20/2017 0931   CL 105 04/25/2019 0301   CO2 25 04/25/2019 0301   CO2 27 11/20/2017 0931   GLUCOSE 87 04/25/2019 0301   GLUCOSE 99 11/20/2017 0931   BUN 8 04/25/2019 0301   BUN 16.6 11/20/2017 0931   CREATININE 1.00 04/25/2019 0301   CREATININE 1.02 12/01/2018 0920   CREATININE 0.99 12/02/2017 1249   CREATININE 1.2 11/20/2017 0931   CALCIUM 7.7 (L) 04/25/2019 0301   CALCIUM 8.9 11/20/2017 0931   PROT 8.5 (H) 04/24/2019 1631   PROT 7.5  11/20/2017 0931   ALBUMIN 3.3 (L) 04/24/2019 1631   ALBUMIN 3.1 (L) 11/20/2017 0931   AST 29 04/24/2019 1631   AST 16 12/01/2018 0920   AST 38 (H) 11/20/2017 0931   ALT 13 04/24/2019 1631   ALT 6 12/01/2018 0920   ALT 45 11/20/2017 0931   ALKPHOS 77 04/24/2019 1631   ALKPHOS 62 11/20/2017 0931   BILITOT 0.8 04/24/2019 1631   BILITOT 0.7 12/01/2018 0920   BILITOT 0.66 11/20/2017 0931   GFRNONAA >60 04/25/2019 0301   GFRNONAA >60 12/01/2018 0920   GFRNONAA 52 (L) 02/11/2017 1640   GFRAA >60 04/25/2019 0301   GFRAA >60 12/01/2018 0920   GFRAA 60 02/11/2017 1640   Lipase     Component Value Date/Time   LIPASE 24 04/24/2019 1631       Studies/Results: Dg Chest 2 View  Result Date: 04/24/2019 CLINICAL DATA:  Increased sputum production EXAM: CHEST - 2 VIEW COMPARISON:  04/07/2019 FINDINGS: Hyperinflation. Right Port-A-Cath remains in place, unchanged. Heart and mediastinal contours are within normal limits. No focal opacities or effusions. No acute bony abnormality. IMPRESSION: Hyperinflation.  No active disease. Electronically Signed   By: Rolm Baptise M.D.   On: 04/24/2019 23:37   Ct Abdomen Pelvis W Contrast  Result Date:  04/24/2019 CLINICAL DATA:  High grade bowel obstruction, history of small-bowel obstruction, duodenal ulcer, prostate cancer, Crohn's disease, spigelian hernia EXAM: CT ABDOMEN AND PELVIS WITH CONTRAST TECHNIQUE: Multidetector CT imaging of the abdomen and pelvis was performed using the standard protocol following bolus administration of intravenous contrast. Sagittal and coronal MPR images reconstructed from axial data set. CONTRAST:  18m ISOVUE-300 IOPAMIDOL (ISOVUE-300) INJECTION 61% IV. No oral contrast. COMPARISON:  03/25/2019 FINDINGS: Lower chest: Minimal atelectasis at LEFT lung base. Hepatobiliary: Dependent gallstone within gallbladder. No gallbladder wall thickening. No focal hepatic abnormalities. Pancreas: Normal appearance Spleen: Normal appearance  Adrenals/Urinary Tract: Adrenal glands normal appearance. Tiny RIGHT renal cyst. Kidneys and ureters normal appearance. Tiny dependent calculi within urinary bladder. Stomach/Bowel: Stomach unremarkable. Ostomy RIGHT lower quadrant likely ileostomy. Multiple small bowel loops and descending colon extend into a large LEFT spigelian hernia. Dilatation of colon proximal to the herniated segment. Question mild bowel wall thickening of the RIGHT colon. Appendix not visualized. Small bowel loops decompressed. A large gas and fluid collection in the mid abdomen at the level of the iliac crests appears represent a segment of colon rather than an abscess. Vascular/Lymphatic: Aorta normal caliber. Vascular structures patent. LEFT para-aortic adenopathy unchanged. Scattered normal and upper normal sized mesenteric lymph nodes RIGHT mid abdomen. Reproductive: Prostate gland enlarged, contiguous with abnormal soft tissue in the RIGHT pelvis which extends posterolaterally and abuts the rectum, stable since prior exams, overall in aggregate 6.0 x 4.2 x 5.1 cm, question local extension of prostate cancer versus post therapy soft tissue changes. Other: Linear collection of gas and fluid is identified in the anterior RIGHT upper quadrant, 5.7 x 0.8 cm image 32 questionably extending 5 cm length, does not appear to communicate with bowel, question small abscess. No additional free air or free fluid. LEFT spigelian hernia as noted above. Musculoskeletal: No acute osseous findings. IMPRESSION: LEFT lower quadrant ileostomy. Dilated colon proximal to herniation of the descending colon into a large LEFT spigelian hernia consistent with a degree of colonic obstruction. Additional herniation of small bowel loops into LEFT spigelian hernia without small bowel dilatation/obstruction. Thin gas and fluid collection in the RIGHT upper quadrant anterior to the RIGHT colon 5.8 x 0.8 cm extending questionably 5 cm length question small abscess  collection, does not appear to communicate with bowel. Abnormal soft tissue in the RIGHT pelvis contiguous with the prostate gland, question prostate cancer with local extension versus posttherapy changes; this is not significantly changed since previous study, recommend correlation with PSA and potentially follow-up PET-CT. Electronically Signed   By: MLavonia DanaM.D.   On: 04/24/2019 18:42   Dg Abd Portable 1v  Result Date: 04/25/2019 CLINICAL DATA:  Evaluate for small bowel obstruction. Decreased output from patient's colostomy. EXAM: PORTABLE ABDOMEN - 1 VIEW COMPARISON:  CT abdomen pelvis-04/24/2019 FINDINGS: Mild-to-moderate gaseous distention of several rather patulous appearing loops of small bowel with index loop of small bowel in the right mid hemiabdomen measuring approximately 4.4 cm in diameter. Air is seen within several loops of small bowel within known left ventral lateral abdominal wall (spigelian) hernia. No supine evidence of pneumoperitoneum. No pneumatosis or portal venous gas. Excreted contrast seen within the urinary bladder. Surgical mesh overlies the right lower abdomen. No acute osseous abnormalities. IMPRESSION: Mild-to-moderate gaseous distension of several loops of small bowel as could be seen in the setting of small-bowel obstruction suggested on preceding abdominal CT. Electronically Signed   By: JSandi MariscalM.D.   On: 04/25/2019 08:11  Anti-infectives: Anti-infectives (From admission, onward)   Start     Dose/Rate Route Frequency Ordered Stop   04/25/19 0800  ciprofloxacin (CIPRO) IVPB 400 mg     400 mg 200 mL/hr over 60 Minutes Intravenous Every 12 hours 04/24/19 2236     04/25/19 0600  metroNIDAZOLE (FLAGYL) IVPB 500 mg     500 mg 100 mL/hr over 60 Minutes Intravenous Every 8 hours 04/24/19 2234     04/24/19 1915  ciprofloxacin (CIPRO) IVPB 400 mg     400 mg 200 mL/hr over 60 Minutes Intravenous  Once 04/24/19 1900 04/24/19 2103   04/24/19 1915  metroNIDAZOLE  (FLAGYL) IVPB 500 mg     500 mg 100 mL/hr over 60 Minutes Intravenous  Once 04/24/19 1900 04/24/19 2200       Assessment/Plan Crohn's disease on immunosuppression Parastomal hernia Metastatic prostate cancer- on palliative treatment Xtandi  Ostomy in place Moderate protein calorie malnutrition Chronic pain   Partial small bowel obstruction RUQ gas/fluid collection - Xray with persistent dilated loops of small bowel but patient has no abdominal pain and is having bowel function. Trial clear liquids. Mobilize.  Continue IV abx.  ID - cipro/flagyl 5/3>> FEN - IVF, CLD VTE - SCDs, lovenox Foley - none Follow up - GI, tertiary referral center for management and possible surgery   LOS: 0 days    Wellington Hampshire , [facility] 04/25/2019, 8:28 AM Pager: [phone] Mon-Thurs 7:00 am-4:30 pm Fri 7:00 am -11:30 AM Sat-Sun 7:00 am-11:30 am

## 2019-04-25 NOTE — Progress Notes (Signed)
Family Medicine Teaching Service Daily Progress Note Intern Pager: [phone]  Patient name: Daniel Howard Medical record number: [id number] Date of birth: 04-15-1956 Age: 62 y.o. Gender: male  Primary Care Provider: [name], [name], MD Consultants: IP CONSULT TO FAMILY PRACTICE Code Status: DNR   Pt Overview and Major Events to Date:  Hospital Day: 2 04/24/2019: admitted for Abdominal Pain   Assessment and Plan: Daniel Howard is a 63 y.o. male who presented w/ Abdominal Pain in the setting of Crohn's disease on immunosuppresion. Daniel Howard  has a past medical history of Abnormal finding of biliary tract, Anxiety, Crohn's (1982), Duodenal ulcer (2010), History of blood transfusion (1992), Peristomal hernia, Prostate cancer ([place]) (2018), SBO (small bowel obstruction) ([place]) (11/10/2018), Small bowel obstruction ([place]) (10/2017; 11/21/2017; 02/16/2018), and Spigelian hernia.  # SBO Pain is greatly improved from yesterday.  Surgery is following and did not recommend NG tube placement on admission.  Today, patient can be advanced to clear liquids.    Continue IV antibiotics, Cipro, Flagyl  Advance to clears  Appreciate surgery recommendations, if patient needs surgery, will need to go to a tertiary care center  Switch to home percocet q 8   # Abdominal Abscess Surgery following as above we will trial clear liquids x24 hours to ensure good p.o. intake and trial p.o. medications for abscess tomorrow.  Surgery will get back to [place] about recommendations for antibiotic medications (length of time, IV/p.o.)  Advance to clears  Continue IV abx at this time  #COPD Hyperinflation on chest x-ray.  Patient reports he was diagnosed previously.  Continue budesonide    # Recent Tx for bronchitis Was on doxycycline prior to admission that started on 4/29 for possible bronchitis.  Lungs continue to be clear on exam and he has not had any respiratory distress. No findings on CXR on 5/3  Discontinue  doxycycline    Continue daily mucinex DM   # Crohn's Disease  Patient takes Humira once a week but has not taken it in about a month and a half.  Hold Humira  # Anemia, Chronic  Takes iron supplements at home.  Holding iron supplements in setting of constipation  # Hx of metastatic Cancer  Previously diagnosed with prostate cancer with metastasis to bones.  Was previously in hospice but has graduated to palliative.  On Xtandi at home.  Per pharmacy, should discontinue this medication if any active infection.  Holding Xtandi daily  # Protein Calorie malnutrition  Likely related to metastatic cancer.  Continue Remeron nightly  # Hypocalcemia   Hold home calcium supplement and resume with resolution of SBO  #FEN/GI:   Fluids: 98 mL/h . Nutrition: Trial Clears    Access: Right chest Port, PIV (5/3), Ileostomy RUQ VTE prophylaxis: Lovenox 40 (CrCl>30)  Disposition: Pending PO intake  ================================================================ Subjective:  NAEO.   Objective: Temp:  [98.6 F (37 C)-98.9 F (37.2 C)] 98.7 F (37.1 C) (05/04 0535) Pulse Rate:  [77-89] 78 (05/04 0535) Resp:  [14-19] 14 (05/04 0535) BP: (104-140)/(66-87) 104/72 (05/04 0535) SpO2:  [96 %-100 %] 100 % (05/04 0535) Weight:  [53.1 kg-58.1 kg] 53.1 kg (05/03 2207) 05/03 0701 - 05/04 0700 In: 397.6 [I.V.:397.6] Out: 200 [Urine:200]   Physical Exam: General: NAD, non-toxic, well-appearing, sitting comfortably in bed. Thin african [place] male  Cardiovascular: RRR, normal S1, S2. 2+ RP bilaterally. No BLEE   Respiratory: CTAB. No IWOB.  Abdomen: + BS. NT, ND, soft to palpation. Ostomy at RUQ.  Extremities: Warm and  well perfused. Moving spontaneously.   Laboratory: I have personally read and reviewed all labs and imaging studies.  CBC: Recent Labs  Lab 04/24/19 1631 04/25/19 0301  WBC 11.6* 10.7*  NEUTROABS 7.9*  --   HGB 11.9* 9.8*  HCT 36.8* 30.2*  MCV 97.1 96.2  PLT 442*  402*   CMP: Recent Labs  Lab 04/24/19 1631 04/25/19 0301  NA 137 137  K 3.7 4.0  CL 104 105  CO2 23 25  GLUCOSE 89 87  BUN 10 8  CREATININE 0.92 1.00  CALCIUM 8.8* 7.7*  ALBUMIN 3.3*  --      GFR: Estimated Creatinine Clearance: 58.3 mL/min (by C-G formula based on SCr of 1 mg/dL).  Liver Function Tests: Recent Labs  Lab 04/24/19 1631  AST 29  ALT 13  ALKPHOS 77  BILITOT 0.8  PROT 8.5*  ALBUMIN 3.3*   Recent Labs  Lab 04/24/19 1631  LIPASE 24    Coagulation Profile: No results for input(s): INR, PROTIME in the last 168 hours.  Cardiac Enzymes: No results for input(s): CKTOTAL, CKMB, CKMBINDEX, TROPONINI in the last 168 hours.  BNP (last 3 results) No results for input(s): PROBNP in the last 72 hours.  HbA1C: No results for input(s): HGBA1C in the last 72 hours.  CBG: No results for input(s): GLUCAP in the last 168 hours.  Lipid Profile: No results for input(s): CHOL, HDL, LDLCALC, TRIG, CHOLHDL, LDLDIRECT in the last 72 hours.  Thyroid Function Tests: No results for input(s): TSH, T4TOTAL, FREET4, T3FREE, THYROIDAB in the last 72 hours.  Anemia Panel: No results for input(s): VITAMINB12, FOLATE, FERRITIN, TIBC, IRON, RETICCTPCT in the last 72 hours.  Urine analysis: Recent Labs    04/24/19 [place]  LABSPEC 1.026  PHURINE 5.0  GLUCOSEU NEGATIVE  HGBUR NEGATIVE  BILIRUBINUR NEGATIVE  KETONESUR 5*  PROTEINUR NEGATIVE  NITRITE NEGATIVE  LEUKOCYTESUR NEGATIVE    Imaging/Diagnostic Tests: Dg Chest 2 View  Result Date: 04/24/2019 CLINICAL DATA:  Increased sputum production EXAM: CHEST - 2 VIEW COMPARISON:  04/07/2019 FINDINGS: Hyperinflation. Right Port-A-Cath remains in place, unchanged. Heart and mediastinal contours are within normal limits. No focal opacities or effusions. No acute bony abnormality. IMPRESSION: Hyperinflation.  No active disease. Electronically Signed   By: Rolm Baptise M.D.   On:  04/24/2019 23:37   Ct Abdomen Pelvis W Contrast  Result Date: 04/24/2019 CLINICAL DATA:  High grade bowel obstruction, history of small-bowel obstruction, duodenal ulcer, prostate cancer, Crohn's disease, spigelian hernia EXAM: CT ABDOMEN AND PELVIS WITH CONTRAST TECHNIQUE: Multidetector CT imaging of the abdomen and pelvis was performed using the standard protocol following bolus administration of intravenous contrast. Sagittal and coronal MPR images reconstructed from axial data set. CONTRAST:  158m ISOVUE-300 IOPAMIDOL (ISOVUE-300) INJECTION 61% IV. No oral contrast. COMPARISON:  03/25/2019 FINDINGS: Lower chest: Minimal atelectasis at LEFT lung base. Hepatobiliary: Dependent gallstone within gallbladder. No gallbladder wall thickening. No focal hepatic abnormalities. Pancreas: Normal appearance Spleen: Normal appearance Adrenals/Urinary Tract: Adrenal glands normal appearance. Tiny RIGHT renal cyst. Kidneys and ureters normal appearance. Tiny dependent calculi within urinary bladder. Stomach/Bowel: Stomach unremarkable. Ostomy RIGHT lower quadrant likely ileostomy. Multiple small bowel loops and descending colon extend into a large LEFT spigelian hernia. Dilatation of colon proximal to the herniated segment. Question mild bowel wall thickening of the RIGHT colon. Appendix not visualized. Small bowel loops decompressed. A large gas and fluid collection in the mid abdomen at the level of the iliac crests appears  represent a segment of colon rather than an abscess. Vascular/Lymphatic: Aorta normal caliber. Vascular structures patent. LEFT para-aortic adenopathy unchanged. Scattered normal and upper normal sized mesenteric lymph nodes RIGHT mid abdomen. Reproductive: Prostate gland enlarged, contiguous with abnormal soft tissue in the RIGHT pelvis which extends posterolaterally and abuts the rectum, stable since prior exams, overall in aggregate 6.0 x 4.2 x 5.1 cm, question local extension of prostate cancer  versus post therapy soft tissue changes. Other: Linear collection of gas and fluid is identified in the anterior RIGHT upper quadrant, 5.7 x 0.8 cm image 32 questionably extending 5 cm length, does not appear to communicate with bowel, question small abscess. No additional free air or free fluid. LEFT spigelian hernia as noted above. Musculoskeletal: No acute osseous findings. IMPRESSION: LEFT lower quadrant ileostomy. Dilated colon proximal to herniation of the descending colon into a large LEFT spigelian hernia consistent with a degree of colonic obstruction. Additional herniation of small bowel loops into LEFT spigelian hernia without small bowel dilatation/obstruction. Thin gas and fluid collection in the RIGHT upper quadrant anterior to the RIGHT colon 5.8 x 0.8 cm extending questionably 5 cm length question small abscess collection, does not appear to communicate with bowel. Abnormal soft tissue in the RIGHT pelvis contiguous with the prostate gland, question prostate cancer with local extension versus posttherapy changes; this is not significantly changed since previous study, recommend correlation with PSA and potentially follow-up PET-CT. Electronically Signed   By: Lavonia Dana M.D.   On: 04/24/2019 18:42    Wilber Oliphant, MD 04/25/2019, 6:30 AM PGY-1, [place] Intern pager: [phone], text pages welcome

## 2019-04-25 NOTE — Progress Notes (Signed)
Manufacturing engineer [facility]) Community Palliative   Received notice that Mr. Gilkes has been admitted.  Please let us know if Mr. Betzer will need anything.  Will keep the community palliative team updated.  Thank you, Venia Carbon RN, BSN, [facility] Liaison [phone]

## 2019-04-25 NOTE — TOC Initial Note (Signed)
Transition of Care [facility]) - Initial/Assessment Note    Patient Details  Name: Daniel Howard MRN: [id number] Date of Birth: 1956-10-02  Transition of Care [facility]) CM/SW Contact:    Marilu Favre, RN Phone Number: 04/25/2019, 12:37 PM  Clinical Narrative:                  Patient from home with wife , has PCP , transportation to appointments. Patient from home with palliative care through [facility]( left Osvaldo Shipper a voicemail). Has hospital bed, and 3 in 1  Expected Discharge Plan: Home/Self Care(Palliative care with [facility] ) Barriers to Discharge: Continued Medical Work up   Patient Goals and CMS Choice Patient states their goals for this hospitalization and ongoing recovery are:: to go home  CMS Medicare.gov Compare Post Acute Care list provided to:: Patient Choice offered to / list presented to : NA  Expected Discharge Plan and Services Expected Discharge Plan: Home/Self Care(Palliative care with [facility] )   Discharge Planning Services: CM Consult   Living arrangements for the past 2 months: Single Family Home                   DME Agency: NA       HH Arranged: NA HH Agency: NA        Prior Living Arrangements/Services Living arrangements for the past 2 months: Single Family Home Lives with:: Spouse Patient language and need for interpreter reviewed:: Yes Do you feel safe going back to the place where you live?: Yes      Need for Family Participation in Patient Care: Yes (Comment) Care giver support system in place?: Yes (comment) Current home services: DME Criminal Activity/Legal Involvement Pertinent to Current Situation/Hospitalization: No - Comment as needed  Activities of Daily Living Home Assistive Devices/Equipment: None ADL Screening (condition at time of admission) Patient's cognitive ability adequate to safely complete daily activities?: Yes Is the patient deaf or have difficulty hearing?: No Does the patient have difficulty  seeing, even when wearing glasses/contacts?: No Does the patient have difficulty concentrating, remembering, or making decisions?: No Patient able to express need for assistance with ADLs?: Yes Does the patient have difficulty dressing or bathing?: No Independently performs ADLs?: Yes (appropriate for developmental age) Does the patient have difficulty walking or climbing stairs?: No Weakness of Legs: None Weakness of Arms/Hands: None  Permission Sought/Granted                  Emotional Assessment Appearance:: Appears stated age Attitude/Demeanor/Rapport: Engaged Affect (typically observed): Accepting Orientation: : Oriented to Self, Oriented to Place, Oriented to  Time, Oriented to Situation   Psych Involvement: No (comment)  Admission diagnosis:  Intra-abdominal abscess (HCC) [K65.1] Nausea and vomiting in adult patient [R11.2] Abdominal pain, unspecified abdominal location [R10.9] Leukocytosis, unspecified type [D72.829] Patient Active Problem List   Diagnosis Date Noted  . Intra-abdominal abscess ([place])   . Incarcerated hernia 01/09/2019  . Palliative care encounter   . Small bowel obstruction due to adhesions ([place]) 12/29/2018  . Small bowel obstruction ([place]) 12/03/2018  . Partial small bowel obstruction ([place]) 11/01/2018  . SBO (small bowel obstruction) ([place]) 09/30/2018  . Iron deficiency anemia 07/03/2018  . 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 Nausea without vomiting 11/10/2017  . Incisional hernia 09/22/2017  . Abdominal pain of multiple sites 09/22/2017  . Goals of care, counseling/discussion 04/02/2017  . Prostate cancer metastatic to bone ([place]) 04/02/2017  . Elevated PSA 03/04/2017  . Osteopenia 11/03/2016  .  Pelvic mass in male 09/04/2016  . 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 Exacerbation of Crohn's disease ([place]) 09/04/2016  . Family history of colon cancer 10/12/2014  . Protein-calorie malnutrition, severe ([place]) 07/27/2014  . Loss of weight 06/09/2014  .  Crohn's disease of both small and large intestine with complication ([place]) 18/48/5927  . Boils 02/11/2013  . Ventral hernia 12/11/2009  . Anemia 12/03/2009  . Regional enteritis/Crohn's 01/25/2007   PCP:  Alycia Rossetti, MD Pharmacy:   [id number] [address] [id number]: [phone]: [phone] [facility] [id number]: [phone]: [id number]: [phone]: [phone]    Social Determinants of Health ([place] Interventions    Readmission Risk Interventions Readmission Risk Prevention Plan 03/28/2019  Transportation Screening Complete  Medication Review ([place] Complete  PCP or Specialist appointment within 3-5 days of discharge Complete  HRI or [place]  SW Recovery Care/Counseling Consult Complete  [place]ucca ValleyPatient Refused  Some recent data might be hidden

## 2019-04-26 LAB — BASIC METABOLIC PANEL
Anion gap: 8 (ref 5–15)
BUN: 5 mg/dL — ABNORMAL LOW (ref 8–23)
CO2: 24 mmol/L (ref 22–32)
Calcium: 7.2 mg/dL — ABNORMAL LOW (ref 8.9–10.3)
Chloride: 106 mmol/L (ref 98–111)
Creatinine, Ser: 0.98 mg/dL (ref 0.61–1.24)
GFR calc Af Amer: >60 mL/min (ref >60)
GFR calc non Af Amer: >60 mL/min (ref >60)
Glucose, Bld: 86 mg/dL (ref 70–99)
Potassium: 3.2 mmol/L — ABNORMAL LOW (ref 3.5–5.1)
Sodium: 138 mmol/L (ref 135–145)

## 2019-04-26 LAB — CBC
HCT: 29.4 % — ABNORMAL LOW (ref 39.0–52.0)
Hemoglobin: 9.8 g/dL — ABNORMAL LOW (ref 13.0–17.0)
MCH: 31.3 pg (ref 26.0–34.0)
MCHC: 33.3 g/dL (ref 30.0–36.0)
MCV: 93.9 fL (ref 80.0–100.0)
Platelets: 393 10*3/uL (ref 150–400)
RBC: 3.13 MIL/uL — ABNORMAL LOW (ref 4.22–5.81)
RDW: 13.9 % (ref 11.5–15.5)
WBC: 7.7 10*3/uL (ref 4.0–10.5)
nRBC: 0 % (ref 0.0–0.2)

## 2019-04-26 LAB — MAGNESIUM: Magnesium: 1.4 mg/dL — ABNORMAL LOW (ref 1.7–2.4)

## 2019-04-26 MED ORDER — DM-GUAIFENESIN ER 30-600 MG PO TB12: 1.0000 | ORAL_TABLET | Freq: Two times a day (BID) | ORAL | 0 refills | Status: DC | PRN

## 2019-04-26 MED ORDER — POTASSIUM CHLORIDE CRYS ER 20 MEQ PO TBCR
40.0000 meq | EXTENDED_RELEASE_TABLET | ORAL | Status: AC
  Administered 2019-04-26 (×3): 40 meq via ORAL
  Filled 2019-04-26 (×3): qty 2

## 2019-04-26 MED ORDER — MAGNESIUM SULFATE 2 GM/50ML IV SOLN
2.0000 g | Freq: Once | INTRAVENOUS | Status: AC
  Administered 2019-04-26: 13:00:00 2 g via INTRAVENOUS
  Filled 2019-04-26: qty 50

## 2019-04-26 MED ORDER — ENZALUTAMIDE 40 MG PO CAPS: 160.0000 mg | ORAL_CAPSULE | Freq: Every day | ORAL | Status: DC

## 2019-04-26 NOTE — Progress Notes (Signed)
Daniel Howard to be D/C'd  per MD order. Discussed with the patient and all questions fully answered.  VSS, Skin clean, dry and intact without evidence of skin break down, no evidence of skin tears noted.  IV catheter discontinued intact. Site without signs and symptoms of complications. Dressing and pressure applied.  An After Visit Summary was printed and given to the patient. Patient received prescription.  D/c education completed with patient/family including follow up instructions, medication list, d/c activities limitations if indicated, with other d/c instructions as indicated by MD - patient able to verbalize understanding, all questions fully answered.   Patient instructed to return to ED, call 911, or call MD for any changes in condition.   Patient to be escorted via [place], and D/C home via private auto.

## 2019-04-26 NOTE — Evaluation (Signed)
Physical Therapy Evaluation & Discharge Patient Details Name: Daniel Howard MRN: [id number] DOB: April 08, 1956 Today's Date: 04/26/2019   History of Present Illness  Pt is a 63 y.o. admitted 04/24/19 with partial SBO and RUQ gas/fluid collection. PMH includes Crohn's disease on immunosuppresion, hernia, prostate CA, multiple SBO, ileostomy.    Clinical Impression  Patient evaluated by Physical Therapy with no further acute PT needs identified. PTA, pt indep and lives with supportive family. All education has been completed and the patient has no further questions. Encouraged continued ambulation during hospital admission. Acute PT is signing off. Thank you for this referral.    Follow Up Recommendations No PT follow up    Equipment Recommendations  None recommended by PT    Recommendations for Other Services       Precautions / Restrictions Precautions Precaution Comments: ostomy Restrictions Weight Bearing Restrictions: No      Mobility  Bed Mobility Overal bed mobility: Independent                Transfers Overall transfer level: Independent                  Ambulation/Gait Ambulation/Gait assistance: Independent Gait Distance (Feet): 400 Feet Assistive device: None     Gait velocity interpretation: 1.31 - 2.62 ft/sec, indicative of limited community Conservation officer, historic buildings [place] (Stroke Patients Only)       Balance Overall balance assessment: Independent                               Standardized Balance Assessment Standardized Balance Assessment : Dynamic Gait Index   Dynamic Gait Index Level Surface: Normal Change in Gait Speed: Normal Gait with Horizontal Head Turns: Normal Gait with Vertical Head Turns: Normal Gait and Pivot Turn: Normal Step Over Obstacle: Mild Impairment Step Around Obstacles: Normal Steps: Normal Total Score: 23       Pertinent Vitals/Pain Pain  Assessment: No/denies pain    Home Living Family/patient expects to be discharged to:: Private residence Living Arrangements: Spouse/significant other Available Help at Discharge: Family;Available 24 hours/day Type of Home: House Home Access: Level entry     Home Layout: One level Home Equipment: Shower seat      Prior Function Level of Independence: Independent               Hand Dominance        Extremity/Trunk Assessment   Upper Extremity Assessment Upper Extremity Assessment: Overall WFL for tasks assessed    Lower Extremity Assessment Lower Extremity Assessment: Overall WFL for tasks assessed       Communication   Communication: No difficulties  Cognition Arousal/Alertness: Awake/alert Behavior During Therapy: WFL for tasks assessed/performed Overall Cognitive Status: Within Functional Limits for tasks assessed                                        General Comments      Exercises     Assessment/Plan    PT Assessment Patent does not need any further PT services  PT Problem List         PT Treatment Interventions      PT Goals (Current goals can be found in the Care Plan section)  Acute Rehab  PT Goals PT Goal Formulation: All assessment and education complete, DC therapy    Frequency     Barriers to discharge        Co-evaluation               AM-PAC PT "6 Clicks" Mobility  Outcome Measure Help needed turning from your back to your side while in a flat bed without using bedrails?: None Help needed moving from lying on your back to sitting on the side of a flat bed without using bedrails?: None Help needed moving to and from a bed to a chair (including a wheelchair)?: None Help needed standing up from a chair using your arms (e.g., wheelchair or bedside chair)?: None Help needed to walk in hospital room?: None Help needed climbing 3-5 steps with a railing? : None 6 Click Score: 24    End of Session    Activity Tolerance: Patient tolerated treatment well Patient left: in chair;with call bell/phone within reach Nurse Communication: Mobility status PT Visit Diagnosis: Other abnormalities of gait and mobility (R26.89)    Time: 1003-1015 PT Time Calculation (min) (ACUTE ONLY): 12 min   Charges:   PT Evaluation $PT Eval Low Complexity: [place], PT, DPT Acute Rehabilitation Services  Pager [phone] Office [place] 04/26/2019, 12:29 PM

## 2019-04-26 NOTE — Progress Notes (Signed)
[facility] Progress Note     Subjective: CC-  No complaints this morning. Tolerating clear liquids. Denies n/v. Ostomy functioning.  Objective: Vital signs in last 24 hours: Temp:  [98.6 F (37 C)] 98.6 F (37 C) (05/05 0526) Pulse Rate:  [73-78] 78 (05/05 0526) Resp:  [16-18] 18 (05/05 0526) BP: (113-115)/(70) 113/70 (05/05 0526) SpO2:  [99 %-100 %] 99 % (05/05 0526) Last BM Date: 04/25/19  Intake/Output from previous day: 05/04 0701 - 05/05 0700 In: 1390.8 [P.O.:360; I.V.:630.8; IV Piggyback:400] Out: 600 [Urine:600] Intake/Output this shift: No intake/output data recorded.  PE: Gen:  Alert, NAD, pleasant HEENT: EOM's intact, pupils equal and round Card:  RRR Pulm:  CTAB, no W/R/R, effort normal Abd: Soft, NT/ND, +BS, well healed midline incision, ostomy pink with air and stool in pouch Skin: no rashes noted, warm and dry   Lab Results:  Recent Labs    04/25/19 0301 04/26/19 0234  WBC 10.7* 7.7  HGB 9.8* 9.8*  HCT 30.2* 29.4*  PLT 402* 393   BMET Recent Labs    04/25/19 0301 04/26/19 0234  NA 137 138  K 4.0 3.2*  CL 105 106  CO2 25 24  GLUCOSE 87 86  BUN 8 5*  CREATININE 1.00 0.98  CALCIUM 7.7* 7.2*   PT/INR No results for input(s): LABPROT, INR in the last 72 hours. CMP     Component Value Date/Time   NA 138 04/26/2019 0234   NA 137 11/20/2017 0931   K 3.2 (L) 04/26/2019 0234   K 3.3 (L) 11/20/2017 0931   CL 106 04/26/2019 0234   CO2 24 04/26/2019 0234   CO2 27 11/20/2017 0931   GLUCOSE 86 04/26/2019 0234   GLUCOSE 99 11/20/2017 0931   BUN 5 (L) 04/26/2019 0234   BUN 16.6 11/20/2017 0931   CREATININE 0.98 04/26/2019 0234   CREATININE 1.02 12/01/2018 0920   CREATININE 0.99 12/02/2017 1249   CREATININE 1.2 11/20/2017 0931   CALCIUM 7.2 (L) 04/26/2019 0234   CALCIUM 8.9 11/20/2017 0931   PROT 8.5 (H) 04/24/2019 1631   PROT 7.5 11/20/2017 0931   ALBUMIN 3.3 (L) 04/24/2019 1631   ALBUMIN 3.1 (L) 11/20/2017 0931   AST 29  04/24/2019 1631   AST 16 12/01/2018 0920   AST 38 (H) 11/20/2017 0931   ALT 13 04/24/2019 1631   ALT 6 12/01/2018 0920   ALT 45 11/20/2017 0931   ALKPHOS 77 04/24/2019 1631   ALKPHOS 62 11/20/2017 0931   BILITOT 0.8 04/24/2019 1631   BILITOT 0.7 12/01/2018 0920   BILITOT 0.66 11/20/2017 0931   GFRNONAA >60 04/26/2019 0234   GFRNONAA >60 12/01/2018 0920   GFRNONAA 52 (L) 02/11/2017 1640   GFRAA >60 04/26/2019 0234   GFRAA >60 12/01/2018 0920   GFRAA 60 02/11/2017 1640   Lipase     Component Value Date/Time   LIPASE 24 04/24/2019 1631       Studies/Results: Dg Chest 2 View  Result Date: 04/24/2019 CLINICAL DATA:  Increased sputum production EXAM: CHEST - 2 VIEW COMPARISON:  04/07/2019 FINDINGS: Hyperinflation. Right Port-A-Cath remains in place, unchanged. Heart and mediastinal contours are within normal limits. No focal opacities or effusions. No acute bony abnormality. IMPRESSION: Hyperinflation.  No active disease. Electronically Signed   By: Rolm Baptise M.D.   On: 04/24/2019 23:37   Ct Abdomen Pelvis W Contrast  Addendum Date: 04/25/2019   ADDENDUM REPORT: 04/25/2019 14:18 ADDENDUM: Correction to impression: Ileostomy is located in the RIGHT lower quadrant (  not LEFT). Electronically Signed   By: Lavonia Dana M.D.   On: 04/25/2019 14:18   Result Date: 04/25/2019 CLINICAL DATA:  High grade bowel obstruction, history of small-bowel obstruction, duodenal ulcer, prostate cancer, Crohn's disease, spigelian hernia EXAM: CT ABDOMEN AND PELVIS WITH CONTRAST TECHNIQUE: Multidetector CT imaging of the abdomen and pelvis was performed using the standard protocol following bolus administration of intravenous contrast. Sagittal and coronal MPR images reconstructed from axial data set. CONTRAST:  157m ISOVUE-300 IOPAMIDOL (ISOVUE-300) INJECTION 61% IV. No oral contrast. COMPARISON:  03/25/2019 FINDINGS: Lower chest: Minimal atelectasis at LEFT lung base. Hepatobiliary: Dependent gallstone within  gallbladder. No gallbladder wall thickening. No focal hepatic abnormalities. Pancreas: Normal appearance Spleen: Normal appearance Adrenals/Urinary Tract: Adrenal glands normal appearance. Tiny RIGHT renal cyst. Kidneys and ureters normal appearance. Tiny dependent calculi within urinary bladder. Stomach/Bowel: Stomach unremarkable. Ostomy RIGHT lower quadrant likely ileostomy. Multiple small bowel loops and descending colon extend into a large LEFT spigelian hernia. Dilatation of colon proximal to the herniated segment. Question mild bowel wall thickening of the RIGHT colon. Appendix not visualized. Small bowel loops decompressed. A large gas and fluid collection in the mid abdomen at the level of the iliac crests appears represent a segment of colon rather than an abscess. Vascular/Lymphatic: Aorta normal caliber. Vascular structures patent. LEFT para-aortic adenopathy unchanged. Scattered normal and upper normal sized mesenteric lymph nodes RIGHT mid abdomen. Reproductive: Prostate gland enlarged, contiguous with abnormal soft tissue in the RIGHT pelvis which extends posterolaterally and abuts the rectum, stable since prior exams, overall in aggregate 6.0 x 4.2 x 5.1 cm, question local extension of prostate cancer versus post therapy soft tissue changes. Other: Linear collection of gas and fluid is identified in the anterior RIGHT upper quadrant, 5.7 x 0.8 cm image 32 questionably extending 5 cm length, does not appear to communicate with bowel, question small abscess. No additional free air or free fluid. LEFT spigelian hernia as noted above. Musculoskeletal: No acute osseous findings. IMPRESSION: LEFT lower quadrant ileostomy. Dilated colon proximal to herniation of the descending colon into a large LEFT spigelian hernia consistent with a degree of colonic obstruction. Additional herniation of small bowel loops into LEFT spigelian hernia without small bowel dilatation/obstruction. Thin gas and fluid collection  in the RIGHT upper quadrant anterior to the RIGHT colon 5.8 x 0.8 cm extending questionably 5 cm length question small abscess collection, does not appear to communicate with bowel. Abnormal soft tissue in the RIGHT pelvis contiguous with the prostate gland, question prostate cancer with local extension versus posttherapy changes; this is not significantly changed since previous study, recommend correlation with PSA and potentially follow-up PET-CT. Electronically Signed: By: MLavonia DanaM.D. On: 04/24/2019 18:42   Dg Abd Portable 1v  Result Date: 04/25/2019 CLINICAL DATA:  Evaluate for small bowel obstruction. Decreased output from patient's colostomy. EXAM: PORTABLE ABDOMEN - 1 VIEW COMPARISON:  CT abdomen pelvis-04/24/2019 FINDINGS: Mild-to-moderate gaseous distention of several rather patulous appearing loops of small bowel with index loop of small bowel in the right mid hemiabdomen measuring approximately 4.4 cm in diameter. Air is seen within several loops of small bowel within known left ventral lateral abdominal wall (spigelian) hernia. No supine evidence of pneumoperitoneum. No pneumatosis or portal venous gas. Excreted contrast seen within the urinary bladder. Surgical mesh overlies the right lower abdomen. No acute osseous abnormalities. IMPRESSION: Mild-to-moderate gaseous distension of several loops of small bowel as could be seen in the setting of small-bowel obstruction suggested on preceding abdominal CT. Electronically  Signed   By: Sandi Mariscal M.D.   On: 04/25/2019 08:11    Anti-infectives: Anti-infectives (From admission, onward)   Start     Dose/Rate Route Frequency Ordered Stop   04/25/19 0800  ciprofloxacin (CIPRO) IVPB 400 mg  Status:  Discontinued     400 mg 200 mL/hr over 60 Minutes Intravenous Every 12 hours 04/24/19 2236 04/26/19 0806   04/25/19 0600  metroNIDAZOLE (FLAGYL) IVPB 500 mg  Status:  Discontinued     500 mg 100 mL/hr over 60 Minutes Intravenous Every 8 hours  04/24/19 2234 04/26/19 0806   04/24/19 1915  ciprofloxacin (CIPRO) IVPB 400 mg     400 mg 200 mL/hr over 60 Minutes Intravenous  Once 04/24/19 1900 04/24/19 2103   04/24/19 1915  metroNIDAZOLE (FLAGYL) IVPB 500 mg     500 mg 100 mL/hr over 60 Minutes Intravenous  Once 04/24/19 1900 04/24/19 2200       Assessment/Plan Crohn's disease on immunosuppression Parastomal hernia Metastatic prostate cancer- on palliative treatment Xtandi  Ostomy in place Moderate protein calorie malnutrition Chronic pain   Partial small bowel obstruction RUQ gas/fluid collection - Abdominal exam benign and patient tolerating clears and having bowel function. Advance to full liquids. D/c abx. Patient stable for discharge from surgical standpoint if he tolerates fulls. Recommend continuing full liquids at home.  Patient states that he and his wife discussed referral to surgeon at tertiary care center and he has no desire for surgery at this time. He does not wish to make an appointment. He knows that he can contact our office at any time and we can assist with making this referral if he changes his mind.  ID - cipro/flagyl 5/3>>5/5 FEN - IVF, FLD VTE - SCDs, lovenox Foley - none Follow up - GI, tertiary referral center for management and possible surgery if patient desires   LOS: 1 day    [place] , [facility] 04/26/2019, 8:06 AM Pager: [phone] Mon-Thurs 7:00 am-4:30 pm Fri 7:00 am -11:30 AM Sat-Sun 7:00 am-11:30 am

## 2019-04-26 NOTE — Progress Notes (Signed)
Family Medicine Teaching Service Daily Progress Note Intern Pager: [phone]  Patient name: Daniel Howard Medical record number: [id number] Date of birth: 08-08-1956 Age: 63 y.o. Gender: male  Primary Care Provider: Alycia Rossetti, MD Consultants: Surgery  Code Status: DNR   Pt Overview and Major Events to Date:  Hospital Day: 3 04/24/2019: admitted for Abdominal Pain   Assessment and Plan: Daniel Howard is a 63 y.o. male who presented w/ Abdominal Pain in the setting of Crohn's disease on immunosuppresion. Daniel Howard  has a past medical history of Abnormal finding of biliary tract, Anxiety, Crohn's (1982), Duodenal ulcer (2010), History of blood transfusion (1992), Peristomal hernia, Prostate cancer ([place]) (2018), SBO (small bowel obstruction) ([place]) (11/10/2018), Small bowel obstruction ([place]) (10/2017; 11/21/2017; 02/16/2018), and Spigelian hernia.  # SBO, #Abdominal Abscess  Patient tolerated clears yesterday. No n/v. No pain this morning. Stable for discharge from surgical POV.   Stop IV antibiotics, Cipro, Flagyl  Advance to full liquid, continue full liquid at ome.   Patient not wanting surgery at this time   # Crohn's Disease  Patient takes Humira once a week but has not taken it in about a month and a half.  Will need to restart Humira. Patient to follow up with provider o/p  #COPD  Continue budesonide  # Anemia, Chronic  Takes iron supplements at home.  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  Was previously in hospice but has graduated to palliative.  On Xtandi at home.  Per pharmacy, should discontinue this medication if any active infection.  Restart Xtandi   # Hypocalcemia   Hold home calcium supplement and resume with resolution of SBO  #FEN/GI:   Fluids: KVO . Nutrition: Full liquid   Access: Right chest Port, PIV (5/3), Ileostomy RUQ VTE prophylaxis: Lovenox 40  (CrCl>30)  Disposition: Discharge when patient tolerating full liquid diet  ================================================================ Subjective:  NAEO.   Objective: Temp:  [98.6 F (37 C)] 98.6 F (37 C) (05/05 0526) Pulse Rate:  [73-78] 78 (05/05 0526) Resp:  [16-18] 18 (05/05 0526) BP: (113-115)/(70) 113/70 (05/05 0526) SpO2:  [99 %-100 %] 99 % (05/05 0526) 05/04 0701 - 05/05 0700 In: 1390.8 [P.O.:360; I.V.:630.8; IV Piggyback:400] Out: 600 [Urine:600]   Physical Exam: General: NAD, non-toxic, well-appearing, sitting comfortably in bed.  Cardiovascular: RRR, normal S1, S2. 2+ RP bilaterally. No BLEE   Respiratory: CTAB. No IWOB.  Abdomen: + BS. NT, ND, soft to palpation. Ostomy in place.  Extremities: Warm and well perfused. Moving spontaneously.   Laboratory: I have personally read and reviewed all labs and imaging studies.  CBC: Recent Labs  Lab 04/24/19 1631 04/25/19 0301 04/26/19 0234  WBC 11.6* 10.7* 7.7  NEUTROABS 7.9*  --   --   HGB 11.9* 9.8* 9.8*  HCT 36.8* 30.2* 29.4*  MCV 97.1 96.2 93.9  PLT 442* 402* 393   CMP: Recent Labs  Lab 04/24/19 1631 04/25/19 0301 04/26/19 0234  NA 137 137 138  K 3.7 4.0 3.2*  CL 104 105 106  CO2 23 25 24   GLUCOSE 89 87 86  BUN 10 8 5*  CREATININE 0.92 1.00 0.98  CALCIUM 8.8* 7.7* 7.2*  ALBUMIN 3.3*  --   --      GFR: Estimated Creatinine Clearance: 59.5 mL/min (by C-G formula based on SCr of 0.98 mg/dL).  Liver Function Tests: Recent Labs  Lab 04/24/19 1631  AST 29  ALT 13  ALKPHOS 77  BILITOT 0.8  PROT 8.5*  ALBUMIN 3.3*   Recent Labs  Lab 04/24/19 1631  LIPASE 24    Coagulation Profile: No results for input(s): INR, PROTIME in the last 168 hours.  Cardiac Enzymes: No results for input(s): CKTOTAL, CKMB, CKMBINDEX, TROPONINI in the last 168 hours.  BNP (last 3 results) No results for input(s): PROBNP in the last 72 hours.  HbA1C: No results for input(s): HGBA1C in the last 72  hours.  CBG: No results for input(s): GLUCAP in the last 168 hours.  Lipid Profile: No results for input(s): CHOL, HDL, LDLCALC, TRIG, CHOLHDL, LDLDIRECT in the last 72 hours.  Thyroid Function Tests: No results for input(s): TSH, T4TOTAL, FREET4, T3FREE, THYROIDAB in the last 72 hours.  Anemia Panel: No results for input(s): VITAMINB12, FOLATE, FERRITIN, TIBC, IRON, RETICCTPCT in the last 72 hours.  Urine analysis: Recent Labs    04/24/19 [place]  LABSPEC 1.026  PHURINE 5.0  GLUCOSEU NEGATIVE  HGBUR NEGATIVE  BILIRUBINUR NEGATIVE  KETONESUR 5*  PROTEINUR NEGATIVE  NITRITE NEGATIVE  LEUKOCYTESUR NEGATIVE    Imaging/Diagnostic Tests: Dg Chest 2 View  Result Date: 04/24/2019 CLINICAL DATA:  Increased sputum production EXAM: CHEST - 2 VIEW COMPARISON:  04/07/2019 FINDINGS: Hyperinflation. Right Port-A-Cath remains in place, unchanged. Heart and mediastinal contours are within normal limits. No focal opacities or effusions. No acute bony abnormality. IMPRESSION: Hyperinflation.  No active disease. Electronically Signed   By: Rolm Baptise M.D.   On: 04/24/2019 23:37   Ct Abdomen Pelvis W Contrast  Addendum Date: 04/25/2019   ADDENDUM REPORT: 04/25/2019 14:18 ADDENDUM: Correction to impression: Ileostomy is located in the RIGHT lower quadrant (not LEFT). Electronically Signed   By: Lavonia Dana M.D.   On: 04/25/2019 14:18   Result Date: 04/25/2019 CLINICAL DATA:  High grade bowel obstruction, history of small-bowel obstruction, duodenal ulcer, prostate cancer, Crohn's disease, spigelian hernia EXAM: CT ABDOMEN AND PELVIS WITH CONTRAST TECHNIQUE: Multidetector CT imaging of the abdomen and pelvis was performed using the standard protocol following bolus administration of intravenous contrast. Sagittal and coronal MPR images reconstructed from axial data set. CONTRAST:  180m ISOVUE-300 IOPAMIDOL (ISOVUE-300) INJECTION 61% IV. No oral contrast.  COMPARISON:  03/25/2019 FINDINGS: Lower chest: Minimal atelectasis at LEFT lung base. Hepatobiliary: Dependent gallstone within gallbladder. No gallbladder wall thickening. No focal hepatic abnormalities. Pancreas: Normal appearance Spleen: Normal appearance Adrenals/Urinary Tract: Adrenal glands normal appearance. Tiny RIGHT renal cyst. Kidneys and ureters normal appearance. Tiny dependent calculi within urinary bladder. Stomach/Bowel: Stomach unremarkable. Ostomy RIGHT lower quadrant likely ileostomy. Multiple small bowel loops and descending colon extend into a large LEFT spigelian hernia. Dilatation of colon proximal to the herniated segment. Question mild bowel wall thickening of the RIGHT colon. Appendix not visualized. Small bowel loops decompressed. A large gas and fluid collection in the mid abdomen at the level of the iliac crests appears represent a segment of colon rather than an abscess. Vascular/Lymphatic: Aorta normal caliber. Vascular structures patent. LEFT para-aortic adenopathy unchanged. Scattered normal and upper normal sized mesenteric lymph nodes RIGHT mid abdomen. Reproductive: Prostate gland enlarged, contiguous with abnormal soft tissue in the RIGHT pelvis which extends posterolaterally and abuts the rectum, stable since prior exams, overall in aggregate 6.0 x 4.2 x 5.1 cm, question local extension of prostate cancer versus post therapy soft tissue changes. Other: Linear collection of gas and fluid is identified in the anterior RIGHT upper quadrant, 5.7 x 0.8 cm image  32 questionably extending 5 cm length, does not appear to communicate with bowel, question small abscess. No additional free air or free fluid. LEFT spigelian hernia as noted above. Musculoskeletal: No acute osseous findings. IMPRESSION: LEFT lower quadrant ileostomy. Dilated colon proximal to herniation of the descending colon into a large LEFT spigelian hernia consistent with a degree of colonic obstruction. Additional  herniation of small bowel loops into LEFT spigelian hernia without small bowel dilatation/obstruction. Thin gas and fluid collection in the RIGHT upper quadrant anterior to the RIGHT colon 5.8 x 0.8 cm extending questionably 5 cm length question small abscess collection, does not appear to communicate with bowel. Abnormal soft tissue in the RIGHT pelvis contiguous with the prostate gland, question prostate cancer with local extension versus posttherapy changes; this is not significantly changed since previous study, recommend correlation with PSA and potentially follow-up PET-CT. Electronically Signed: By: Lavonia Dana M.D. On: 04/24/2019 18:42   Dg Abd Portable 1v  Result Date: 04/25/2019 CLINICAL DATA:  Evaluate for small bowel obstruction. Decreased output from patient's colostomy. EXAM: PORTABLE ABDOMEN - 1 VIEW COMPARISON:  CT abdomen pelvis-04/24/2019 FINDINGS: Mild-to-moderate gaseous distention of several rather patulous appearing loops of small bowel with index loop of small bowel in the right mid hemiabdomen measuring approximately 4.4 cm in diameter. Air is seen within several loops of small bowel within known left ventral lateral abdominal wall (spigelian) hernia. No supine evidence of pneumoperitoneum. No pneumatosis or portal venous gas. Excreted contrast seen within the urinary bladder. Surgical mesh overlies the right lower abdomen. No acute osseous abnormalities. IMPRESSION: Mild-to-moderate gaseous distension of several loops of small bowel as could be seen in the setting of small-bowel obstruction suggested on preceding abdominal CT. Electronically Signed   By: Sandi Mariscal M.D.   On: 04/25/2019 08:11    Wilber Oliphant, MD 04/26/2019, 6:33 AM PGY-1, [place] Intern pager: [phone], text pages welcome

## 2019-04-26 NOTE — Discharge Instructions (Addendum)
Dear Daniel Howard,   Thank you for letting us participate in your care! In this section, you will find a brief hospital admission summary of why you were admitted to the hospital, what happened during your admission, your diagnosis/diagnoses, and recommended follow up.   You were admitted because you were experiencing abdominal pain.  You were evaluated by surgery, who recommended that you be transferred to a tertiary care center if he required surgery.  You declined surgical intervention at this time.  Your pain improved and you are able to tolerate a full liquid diet so you were able to be discharged home  Please follow-up with your primary care provider to ask if you were able to restart your Humira, given that you have not been taking it for about 6 weeks due to feeling sick.  Please follow-up with surgery if you change your mind about further surgical intervention.  Surgery recommends that you continue a full liquid diet when you go home  You may restart your Xtandi after discharge  If you experience any pain at home, please follow-up with your GI doctor and take your home pain medication as needed  [place]  Future Appointments  Date Time Provider [place]  05/03/2019  2:30 PM Daneil Dolin, MD [facility]  05/04/2019 10:00 AM [name], MD [facility] None     Thank you for choosing [facility]! Take care and be well!  [facility]  [place], [place] [address] [phone]

## 2019-04-27 NOTE — Discharge Summary (Signed)
[facility] Discharge Summary  Patient name: Daniel Howard Medical record number: [id number] Date of birth: [birth year] Age: 63 y.o. Gender: male Date of Admission: 04/24/2019  Date of Discharge: 04/26/2019    Admitting Physician: Alveda Reasons, MD  Primary Care Provider: Alycia Rossetti, MD Consultants: None  Indication for Hospitalization: <principal problem not specified>   Discharge Diagnoses/Problem List:  Active Problems:   SBO (small bowel obstruction) ([place])   Intra-abdominal abscess [facility])   Disposition: Home  Discharge Condition: Stable  Discharge Exam:  BP 111/71 (BP Location: Left Arm)   Pulse 89   Temp 98.7 F (37.1 C) (Oral)   Resp 18   Ht 5' 4"  (1.626 m)   Wt 53.1 kg   SpO2 100%   BMI 20.09 kg/m   General: NAD, non-toxic, well-appearing, sitting comfortably in bed.  Cardiovascular: RRR, normal S1, S2. 2+ RP bilaterally. No BLEE   Respiratory: CTAB. No IWOB.  Abdomen: + BS. NT, ND, soft to palpation. Ostomy in place.  Extremities: Warm and well perfused. Moving spontaneously.   Brief Hospital Course:  Daniel Howard is a 63 y.o. male with past medical history significant for Crohn's and multiple abdominal surgeries, who presented to abdominal pain concerning for SBO. Vital signs were stable, admission labs were notable for WBC 11.6.  Abdominal CT shows dilation of the colon proximal to his hernia consistent with "a degree of colonic obstruction" also shows a gas and fluid collection of the right upper quadrant suspicious for abscess. Patient was placed NPO and cefepime and flagyl were started. Patient's pain continued to improve and tolerated a full liquid diet by the time of discharge. His antibiotics were discontinued per surgery. Surgery also recommended that if patient were to require surgery, that he should go to a tertiary care center. Patient declined any further surgeries and will follow up with surgery should he change  his mind.   Issues for Follow Up:  1. Symptom improvement of abdominal pain 2. How patient is tolerating diet    Significant Procedures: none  Significant Labs and Imaging:  Recent Labs  Lab 04/24/19 1631 04/25/19 0301 04/26/19 0234  WBC 11.6* 10.7* 7.7  HGB 11.9* 9.8* 9.8*  HCT 36.8* 30.2* 29.4*  PLT 442* 402* 393   Recent Labs  Lab 04/24/19 1631 04/25/19 0301 04/26/19 0223 04/26/19 0234  NA 137 137  --  138  K 3.7 4.0  --  3.2*  CL 104 105  --  106  CO2 23 25  --  24  GLUCOSE 89 87  --  86  BUN 10 8  --  5*  CREATININE 0.92 1.00  --  0.98  CALCIUM 8.8* 7.7*  --  7.2*  MG  --   --  1.4*  --   ALKPHOS 77  --   --   --   AST 29  --   --   --   ALT 13  --   --   --   ALBUMIN 3.3*  --   --   --     Dg Chest 2 View  Result Date: 04/24/2019 CLINICAL DATA:  Increased sputum production EXAM: CHEST - 2 VIEW COMPARISON:  04/07/2019 FINDINGS: Hyperinflation. Right Port-A-Cath remains in place, unchanged. Heart and mediastinal contours are within normal limits. No focal opacities or effusions. No acute bony abnormality. IMPRESSION: Hyperinflation.  No active disease. Electronically Signed   By: Rolm Baptise M.D.   On: 04/24/2019 23:37  Nm Bone Scan Whole Body  Result Date: 04/19/2019 CLINICAL DATA:  History of prostate cancer. EXAM: NUCLEAR MEDICINE WHOLE BODY BONE SCAN TECHNIQUE: Whole body anterior and posterior images were obtained approximately 3 hours after intravenous injection of radiopharmaceutical. RADIOPHARMACEUTICALS:  21.3 mCi Technetium-34mMDP IV COMPARISON:  Bone scan of September 01, 2018. CT scan of March 25, 2019. FINDINGS: There is again noted focus of abnormal uptake involving the proximal right femur in the region of the lesser trochanter. Asymmetric uptake is also noted in the right ischium. This is unchanged compared to prior exam and consistent with metastatic disease. These findings correspond to sclerotic density seen on CT scan. No new areas of abnormal  uptake are noted. IMPRESSION: Stable foci of abnormal uptake are noted in the right ischium and proximal left femur consistent with metastatic disease. No new areas of abnormal uptake are noted. Electronically Signed   By: JMarijo ConceptionM.D.   On: 04/19/2019 15:01   Ct Abdomen Pelvis W Contrast  Addendum Date: 04/25/2019   ADDENDUM REPORT: 04/25/2019 14:18 ADDENDUM: Correction to impression: Ileostomy is located in the RIGHT lower quadrant (not LEFT). Electronically Signed   By: MLavonia DanaM.D.   On: 04/25/2019 14:18   Result Date: 04/25/2019 CLINICAL DATA:  High grade bowel obstruction, history of small-bowel obstruction, duodenal ulcer, prostate cancer, Crohn's disease, spigelian hernia EXAM: CT ABDOMEN AND PELVIS WITH CONTRAST TECHNIQUE: Multidetector CT imaging of the abdomen and pelvis was performed using the standard protocol following bolus administration of intravenous contrast. Sagittal and coronal MPR images reconstructed from axial data set. CONTRAST:  1069mISOVUE-300 IOPAMIDOL (ISOVUE-300) INJECTION 61% IV. No oral contrast. COMPARISON:  03/25/2019 FINDINGS: Lower chest: Minimal atelectasis at LEFT lung base. Hepatobiliary: Dependent gallstone within gallbladder. No gallbladder wall thickening. No focal hepatic abnormalities. Pancreas: Normal appearance Spleen: Normal appearance Adrenals/Urinary Tract: Adrenal glands normal appearance. Tiny RIGHT renal cyst. Kidneys and ureters normal appearance. Tiny dependent calculi within urinary bladder. Stomach/Bowel: Stomach unremarkable. Ostomy RIGHT lower quadrant likely ileostomy. Multiple small bowel loops and descending colon extend into a large LEFT spigelian hernia. Dilatation of colon proximal to the herniated segment. Question mild bowel wall thickening of the RIGHT colon. Appendix not visualized. Small bowel loops decompressed. A large gas and fluid collection in the mid abdomen at the level of the iliac crests appears represent a segment of  colon rather than an abscess. Vascular/Lymphatic: Aorta normal caliber. Vascular structures patent. LEFT para-aortic adenopathy unchanged. Scattered normal and upper normal sized mesenteric lymph nodes RIGHT mid abdomen. Reproductive: Prostate gland enlarged, contiguous with abnormal soft tissue in the RIGHT pelvis which extends posterolaterally and abuts the rectum, stable since prior exams, overall in aggregate 6.0 x 4.2 x 5.1 cm, question local extension of prostate cancer versus post therapy soft tissue changes. Other: Linear collection of gas and fluid is identified in the anterior RIGHT upper quadrant, 5.7 x 0.8 cm image 32 questionably extending 5 cm length, does not appear to communicate with bowel, question small abscess. No additional free air or free fluid. LEFT spigelian hernia as noted above. Musculoskeletal: No acute osseous findings. IMPRESSION: LEFT lower quadrant ileostomy. Dilated colon proximal to herniation of the descending colon into a large LEFT spigelian hernia consistent with a degree of colonic obstruction. Additional herniation of small bowel loops into LEFT spigelian hernia without small bowel dilatation/obstruction. Thin gas and fluid collection in the RIGHT upper quadrant anterior to the RIGHT colon 5.8 x 0.8 cm extending questionably 5 cm length question small  abscess collection, does not appear to communicate with bowel. Abnormal soft tissue in the RIGHT pelvis contiguous with the prostate gland, question prostate cancer with local extension versus posttherapy changes; this is not significantly changed since previous study, recommend correlation with PSA and potentially follow-up PET-CT. Electronically Signed: By: Lavonia Dana M.D. On: 04/24/2019 18:42   Dg Chest Port 1 View  Result Date: 04/07/2019 CLINICAL DATA:  Cough EXAM: PORTABLE CHEST 1 VIEW COMPARISON:  March 17, 2019 chest radiograph and chest CT February 20, 2019 FINDINGS: Port-A-Cath tip is in the superior vena cava. No  pneumothorax. There is no appreciable edema or consolidation. Heart size and pulmonary vascularity are normal. Slight fullness on the right hilum is likely due to lymph node prominence, demonstrated on recent prior CT. No bone lesions. IMPRESSION: No edema or consolidation. Slight right hilar fullness, likely due to lymph node prominence, demonstrated on recent CT. Port-A-Cath tip in superior vena cava. No pneumothorax. Heart size normal. Electronically Signed   By: Lowella Grip III M.D.   On: 04/07/2019 16:18   Dg Abd Portable 1v  Result Date: 04/25/2019 CLINICAL DATA:  Evaluate for small bowel obstruction. Decreased output from patient's colostomy. EXAM: PORTABLE ABDOMEN - 1 VIEW COMPARISON:  CT abdomen pelvis-04/24/2019 FINDINGS: Mild-to-moderate gaseous distention of several rather patulous appearing loops of small bowel with index loop of small bowel in the right mid hemiabdomen measuring approximately 4.4 cm in diameter. Air is seen within several loops of small bowel within known left ventral lateral abdominal wall (spigelian) hernia. No supine evidence of pneumoperitoneum. No pneumatosis or portal venous gas. Excreted contrast seen within the urinary bladder. Surgical mesh overlies the right lower abdomen. No acute osseous abnormalities. IMPRESSION: Mild-to-moderate gaseous distension of several loops of small bowel as could be seen in the setting of small-bowel obstruction suggested on preceding abdominal CT. Electronically Signed   By: Sandi Mariscal M.D.   On: 04/25/2019 08:11    Results/Tests Pending at Time of Discharge:  . none  Discharge Medications:  Allergies as of 04/26/2019      Reactions   Penicillins Hives   Has patient had a PCN reaction causing immediate rash, facial/tongue/throat swelling, SOB or lightheadedness with hypotension: Yes Has patient had a PCN reaction causing severe rash involving mucus membranes or skin necrosis: No Has patient had a PCN reaction that required  hospitalization: No Has patient had a PCN reaction occurring within the last 10 years: No If all of the above answers are "NO", then may proceed with Cephalosporin use.      Medication List    STOP taking these medications   doxycycline 100 MG tablet Commonly known as:  VIBRA-TABS   oxyCODONE-acetaminophen 10-325 MG tablet Commonly known as:  PERCOCET     TAKE these medications   Adalimumab 40 MG/0.8ML Pnkt Commonly known as:  Humira Pen Inject 1 Syringe into the skin once a week. What changed:    how much to take  when to take this   budesonide 3 MG 24 hr capsule Commonly known as:  ENTOCORT EC TAKE 3 CAPSULES BY MOUTH EVERY DAY   CALCIUM 600-D PO Take 2 tablets by mouth daily.   Centrum Silver 50+Men Tabs Take 1 tablet by mouth daily with breakfast.   dextromethorphan-guaiFENesin 30-600 MG 12hr tablet Commonly known as:  MUCINEX DM Take 1 tablet by mouth 2 (two) times daily as needed for cough. What changed:    when to take this  reasons to take this  ferrous sulfate 325 (65 FE) MG tablet Take 325 mg by mouth daily with breakfast.   lidocaine-prilocaine cream Commonly known as:  EMLA Apply 1 application topically daily as needed (when accessing port).   mirtazapine 15 MG tablet Commonly known as:  REMERON TAKE 1 TABLET BY MOUTH AT BEDTIME   Proventil HFA 108 (90 Base) MCG/ACT inhaler Generic drug:  albuterol Inhale 2 puffs into the lungs every 6 (six) hours as needed for wheezing or shortness of breath.   simethicone 80 MG chewable tablet Commonly known as:  MYLICON Chew 80 mg by mouth 3 (three) times daily as needed (gas/flatulence).   Xtandi 40 MG capsule Generic drug:  enzalutamide TAKE 4 CAPSULES (160 MG TOTAL) BY MOUTH DAILY. What changed:  See the new instructions.       Discharge Instructions: Please refer to Patient Instructions section of EMR for full details.  Patient was counseled important signs and symptoms that should prompt  return to medical care, changes in medications, dietary instructions, activity restrictions, and follow up appointments.   Follow-Up Appointments: Future Appointments  Date Time Provider [place]  05/03/2019  2:30 PM Daneil Dolin, MD [facility]  05/04/2019 10:00 AM [name], MD [facility] None     Wilber Oliphant, MD 04/27/2019, 8:45 PM PGY-1, [place]

## 2019-05-03 ENCOUNTER — Other Ambulatory Visit: Payer: Self-pay | Admitting: Family Medicine

## 2019-05-03 ENCOUNTER — Other Ambulatory Visit: Payer: Self-pay

## 2019-05-03 ENCOUNTER — Encounter: Payer: Self-pay | Admitting: Internal Medicine

## 2019-05-03 ENCOUNTER — Ambulatory Visit (INDEPENDENT_AMBULATORY_CARE_PROVIDER_SITE_OTHER): Payer: Medicare Other | Admitting: Internal Medicine

## 2019-05-03 DIAGNOSIS — K50012 Crohn's disease of small intestine with intestinal obstruction: Secondary | ICD-10-CM

## 2019-05-03 MED ORDER — OXYCODONE-ACETAMINOPHEN 10-325 MG PO TABS: 1.0000 | ORAL_TABLET | Freq: Three times a day (TID) | ORAL | 0 refills | Status: AC | PRN

## 2019-05-03 NOTE — Patient Instructions (Signed)
  Continue Entocort 9 mg daily.  Continue to hold Humira for now.  If cough persist in the next week, I recommend Daniel Howard make contact with Dr. [name] for further evaluation.  I will plan to see this nice gentleman back one way or the other in 8 weeks.

## 2019-05-03 NOTE — Telephone Encounter (Signed)
Ok to refill??  Last office visit/ refill 04/20/2019.  Of note, only 5 day supply given?

## 2019-05-03 NOTE — Telephone Encounter (Signed)
Pt needs refill on pain med I do not see any pain med listed on his med list anymore?   Sent to [facility]

## 2019-05-03 NOTE — Progress Notes (Signed)
Referring Provider:  Primary Care Physician:  Alycia Rossetti, MD  Primary GI:   Virtual Visit via Telephone Note Due to COVID-19, visit is conducted virtually and was requested by patient.   I connected with Daniel Howard on 05/03/19 at  2:30 PM EDT by telephone and verified that I am speaking with the correct person using two identifiers.   I discussed the limitations, risks, security and privacy concerns of performing an evaluation and management service by telephone and the availability of in person appointments. I also discussed with the patient that there may be a patient responsible charge related to this service. The patient expressed understanding and agreed to proceed.  Chief Complaint  Patient presents with   Crohn's Disease    f/u. Has not been able to get humira as he has had congestion/cough x 3-4 weeks   sbo     History of Present Illness: 63 year old gentleman with extensive metastatic prostate cancer and complicated Crohn's  -  status post multiple surgeries with multiple abdominal wall hernias.  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  Has had multiple episodes of small bowel obstructions.  Most recent CT questioned a small intra-abdominal abscess.  He did not undergo drainage.  No surgical procedures performed.  He was treated with antibiotics.  Currently is on doxycycline for chronic cough which he has had for a while.  Has not had a fever.  He continues on Entocort 9 mg daily for his Crohn's.  His wife stopped his Humira,  given recent concerns for abscess and cough.  He remains extremely active.  He goes outside and weed eats, etc.  Isolating because of the pandemic.  Ileostomy is functioning well.  He cites his appetite is good.  No  significant abdominal pain these days.   Past Medical History:  Diagnosis Date   Abnormal finding of biliary tract    MRCP shows pancreatic/biliary tract dilation. EUS 2010 confirmed dilation but no chronic pancreaitis or mass. Vascular ectasia crimpoing distal CBD.    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.s/p transverse loop colostomy secondary to stricture 1992., followed by end-transverse ostomy, followed by right hemicolectomy, followed  by takedown & ileostomy   Duodenal ulcer 2010   nsaids   History of blood transfusion 1992   "related to colon OR"   Peristomal hernia    Prostate cancer ([place]) 2018   SBO (small bowel obstruction) ([place]) 11/10/2018   Small bowel obstruction ([place]) 10/2017; 11/21/2017; 02/16/2018   Spigelian hernia    bilateral     Past Surgical History:  Procedure Laterality Date   APPENDECTOMY  1992   at time of exp laparotomy at which time he was noted to have fistulizing Crohn's rather than UC   COLON SURGERY     COLONOSCOPY [name] 08/23/2014   [id number] proctoscopy with possible fistulous opening in thebase of rectal/anal stump.     COLOSTOMY  1992   transverse loop colostomy secondary to a stricture   ESOPHAGOGASTRODUODENOSCOPY  05/2009   SLF: multiple antral erosions, large ulcer at ansatomosis (postsurgical changes at duodenal bulb and  second portion of duodenum) BX c/x NSAIDS.   EUS  10/04/2009   Dr. Estill Bakes dilated CBD and main pancreatic duct.  No pancreatic   EXPLORATORY LAPAROTOMY  1992   FLEXIBLE SIGMOIDOSCOPY  1988   Dr. Laural Golden- suggested rohn's disease but the biopsies were not collaborative.   FLEXIBLE SIGMOIDOSCOPY [name] 09/08/2016   Procedure: FLEXIBLE SIGMOIDOSCOPY;  Surgeon: Wonda Horner, MD;  Location: [facility];  Service:  Gastroenterology;  Laterality: [name];   [place]   incarcerated periostial hernia with additional surgery in 1999   IR FLUORO GUIDE PORT INSERTION RIGHT  04/03/2017   IR US GUIDE VASC ACCESS RIGHT  04/03/2017     Current Meds  Medication Sig   Adalimumab (HUMIRA PEN) 40 MG/0.8ML PNKT Inject 1 Syringe into the skin once a week. (Patient taking differently: Inject 40 mg into the skin every Wednesday. )   albuterol (PROVENTIL HFA) 108 (90 Base) MCG/ACT inhaler Inhale 2 puffs into the lungs every 6 (six) hours as needed for wheezing or shortness of breath.   budesonide (ENTOCORT EC) 3 MG 24 hr capsule TAKE 3 CAPSULES BY MOUTH EVERY DAY (Patient taking differently: Take 9 mg by mouth daily. )   Calcium Carb-Cholecalciferol (CALCIUM 600-D PO) Take 2 tablets by mouth daily.   dextromethorphan-guaiFENesin (MUCINEX DM) 30-600 MG 12hr tablet Take 1 tablet by mouth 2 (two) times daily as needed for cough.   ferrous sulfate 325 (65 FE) MG tablet Take 325 mg by mouth daily with breakfast.   lidocaine-prilocaine (EMLA) cream Apply 1 application topically daily as needed (when accessing port).    mirtazapine (REMERON) 15 MG tablet TAKE 1 TABLET BY MOUTH AT BEDTIME (Patient taking differently: Take 15 mg by mouth at bedtime. )   Multiple Vitamins-Minerals (CENTRUM SILVER 50+MEN) TABS Take 1 tablet by mouth daily with breakfast.   simethicone (MYLICON) 80 MG chewable tablet Chew 80 mg by mouth 3 (three) times daily as needed (gas/flatulence).    XTANDI 40 MG capsule TAKE 4 CAPSULES (160 MG TOTAL) BY MOUTH DAILY. (Patient taking differently: Take 160 mg by mouth daily. )     Family History  Problem Relation Age of Onset   Cancer Father        prostate    Prostate cancer Father    Colon cancer Father 75   Hypertension Sister    Cancer Sister    Depression Sister    Breast cancer Sister    COPD Sister    Aneurysm  Brother        deceased, brain aneurysm    Social History   Socioeconomic History   Marital status: Married    Spouse name: Not on file   Number of children: 9   Years of education: Not on file   Highest education level: Not on file  Occupational History   Occupation: disabled    Fish farm manager: UNEMPLOYED  Social Designer, fashion/clothing strain: Not on file   Food insecurity:    Worry: Not on file    Inability: Not on file   Transportation needs:    Medical: Not on file    Non-medical: Not on file  Tobacco Use   Smoking status: Former Smoker    Packs/day: 0.75    Years: 32.00    Pack years: 24.00    Types: Cigarettes    Last attempt to quit: 12/21/2009    Years since quitting: 9.3  Smokeless tobacco: Never Used  Substance and Sexual Activity   Alcohol use: No    Alcohol/week: 0.0 standard drinks    Comment: Former drinker   Drug use: No   Sexual activity: Not Currently    Partners: Female  Lifestyle   Physical activity:    Days per week: Not on file    Minutes per session: Not on file   Stress: Not on file  Relationships   Social connections:    Talks on phone: Not on file    Gets together: Not on file    Attends religious service: Not on file    Active member of club or organization: Not on file    Attends meetings of clubs or organizations: Not on file    Relationship status: Not on file  Other Topics Concern   Not on file  Social History Narrative   Not on file       Review of Systems:  As in history of present illness  Observations/Objective: No distress. Unable to perform physical exam due to telephone encounter. No video available.   Assessment and Plan:    63 year old gentleman with history of extremely complicated Crohn's disease status post permanent ileostomy recurrent small bowel obstruction secondary to same.  Metastatic prostate cancer.  Recent hospitalization.  Found to have a possible small abscess-Treated with  antibiotics.  Drainage or surgery.  His Humira has been held.  He continues on Entocort.  Understand the utility of stopping Humira while he has an infection.  Interrupting Humira with pulsed therapy may predispose him to the development of Humira antibodies. I wonder if his cough is related to an element of allergen exposure.  We are in the pollen season and he likes being outside.   Follow Up Instructions:   Continue Entocort 9 mg daily.  Continue to hold Humira for now.  If cough persist in the next week, I have recommended Mr.Rufino make contact with Dr. [name] for further evaluation.  I will plan to see this nice gentleman back one way or the other in 8 weeks.    I discussed the assessment and treatment plan with the patient. The patient was provided an opportunity to ask questions and all were answered. The patient agreed with the plan and demonstrated an understanding of the instructions.   The patient was advised to call back or seek an in-person evaluation if the symptoms worsen or if the condition fails to improve as anticipated.  I provided 9 minutes of non-face-to-face time during this encounter.  R Garfield Cornea, MD [facility]

## 2019-05-04 ENCOUNTER — Other Ambulatory Visit

## 2019-05-04 ENCOUNTER — Inpatient Hospital Stay: Payer: Medicare Other | Attending: Oncology | Admitting: Oncology

## 2019-05-04 DIAGNOSIS — Z79899 Other long term (current) drug therapy: Secondary | ICD-10-CM

## 2019-05-04 DIAGNOSIS — Z79891 Long term (current) use of opiate analgesic: Secondary | ICD-10-CM

## 2019-05-04 DIAGNOSIS — C7951 Secondary malignant neoplasm of bone: Secondary | ICD-10-CM | POA: Diagnosis not present

## 2019-05-04 DIAGNOSIS — Z7951 Long term (current) use of inhaled steroids: Secondary | ICD-10-CM

## 2019-05-04 DIAGNOSIS — C61 Malignant neoplasm of prostate: Secondary | ICD-10-CM

## 2019-05-04 DIAGNOSIS — Z9221 Personal history of antineoplastic chemotherapy: Secondary | ICD-10-CM

## 2019-05-04 NOTE — Progress Notes (Signed)
Hematology and Oncology Follow Up for Telemedicine Visits  Daniel Howard [id number] Dec 18, 1956 63 y.o. 05/04/2019 9:05 AM [name], [name], [name], [name], MD   I connected with Daniel Howard  on 05/04/19 at 10:00 AM EDT by telephone visit and verified that I am speaking with the correct person using two identifiers.   I discussed the limitations, risks, security and privacy concerns of performing an evaluation and management service by telemedicine and the availability of in-person appointments. I also discussed with the patient that there may be a patient responsible charge related to this service. The patient expressed understanding and agreed to proceed.  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  He has documented castration-resistant at this time.  Prior Therapy:   He is status post biopsy on 03/16/2017 of a pelvic mass which confirmed the presence of prostate cancer.  Taxotere chemotherapy at 75 mg/m started on 04/24/2017. He S/P 6 cycles of therapy completed in August 2018.  Current therapy:  Androgen deprivation therapy ongoing at [facility].  Xtandi 160 mg daily started in November 2019   Interim History: Daniel Howard feels reasonably well without any recent complaints.  He was recently discharged from the hospital for a small bowel obstruction that has resolved.  Since his discharge he reports feeling reasonably fair although he has lost weight expectedly.  He denies any complications related to Xtandi at this time.  Denies any bone pain or pathological fractures.  He denies any worsening diarrhea.    Medications: I have reviewed the patient's current medications.  Current Outpatient Medications  Medication Sig Dispense  Refill  . Adalimumab (HUMIRA PEN) 40 MG/0.8ML PNKT Inject 1 Syringe into the skin once a week. (Patient taking differently: Inject 40 mg into the skin every Wednesday. ) 4 each 3  . albuterol (PROVENTIL HFA) 108 (90 Base) MCG/ACT inhaler Inhale 2 puffs into the lungs every 6 (six) hours as needed for wheezing or shortness of breath.    . budesonide (ENTOCORT EC) 3 MG 24 hr capsule TAKE 3 CAPSULES BY MOUTH EVERY DAY (Patient taking differently: Take 9 mg by mouth daily. ) 90 capsule 2  . Calcium Carb-Cholecalciferol (CALCIUM 600-D PO) Take 2 tablets by mouth daily.    [name] dextromethorphan-guaiFENesin (MUCINEX DM) 30-600 MG 12hr tablet Take 1 tablet by mouth 2 (two) times daily as needed for cough. 20 tablet 0  . ferrous sulfate 325 (65 FE) MG tablet Take 325 mg by mouth daily with breakfast.    . lidocaine-prilocaine (EMLA) cream Apply 1 application topically daily as needed (when accessing port).     . mirtazapine (REMERON) 15 MG tablet TAKE 1 TABLET BY MOUTH AT BEDTIME (Patient taking differently: Take 15 mg by mouth at bedtime. ) 90 tablet 1  . Multiple Vitamins-Minerals (CENTRUM SILVER 50+MEN) TABS Take 1 tablet by mouth daily with breakfast.    . oxyCODONE-acetaminophen (PERCOCET) 10-325 MG tablet Take 1 tablet by mouth every 8 (eight) hours as needed for up to 5 days for pain. 60 tablet 0  . simethicone (MYLICON) 80 MG chewable tablet Chew 80 mg by mouth 3 (three) times daily as needed (gas/flatulence).     [name] 40 MG capsule TAKE 4 CAPSULES (160 MG TOTAL) BY MOUTH DAILY. (Patient taking differently: Take 160 mg by  mouth daily. ) 120 capsule 0   No current facility-administered medications for this visit.    Facility-Administered Medications Ordered in Other Visits  Medication Dose Route Frequency Provider Last Rate Last Dose  . sodium chloride flush (NS) 0.9 % injection 10 mL  10 mL Intravenous PRN [name], MD   10 mL at 11/20/17 5638     Allergies:  Allergies  Allergen  Reactions  . Penicillins Hives    Has patient had a PCN reaction causing immediate rash, facial/tongue/throat swelling, SOB or lightheadedness with hypotension: Yes Has patient had a PCN reaction causing severe rash involving mucus membranes or skin necrosis: No Has patient had a PCN reaction that required hospitalization: No Has patient had a PCN reaction occurring within the last 10 years: No If all of the above answers are "NO", then may proceed with Cephalosporin use.     Past Medical History, Surgical history, Social history, and Family History were reviewed and updated.    Lab Results: Lab Results  Component Value Date   WBC 7.7 04/26/2019   HGB 9.8 (L) 04/26/2019   HCT 29.4 (L) 04/26/2019   MCV 93.9 04/26/2019   PLT 393 04/26/2019     Chemistry      Component Value Date/Time   NA 138 04/26/2019 0234   NA 137 11/20/2017 0931   K 3.2 (L) 04/26/2019 0234   K 3.3 (L) 11/20/2017 0931   CL 106 04/26/2019 0234   CO2 24 04/26/2019 0234   CO2 27 11/20/2017 0931   BUN 5 (L) 04/26/2019 0234   BUN 16.6 11/20/2017 0931   CREATININE 0.98 04/26/2019 0234   CREATININE 1.02 12/01/2018 0920   CREATININE 0.99 12/02/2017 1249   CREATININE 1.2 11/20/2017 0931      Component Value Date/Time   CALCIUM 7.2 (L) 04/26/2019 0234   CALCIUM 8.9 11/20/2017 0931   ALKPHOS 77 04/24/2019 1631   ALKPHOS 62 11/20/2017 0931   AST 29 04/24/2019 1631   AST 16 12/01/2018 0920   AST 38 (H) 11/20/2017 0931   ALT 13 04/24/2019 1631   ALT 6 12/01/2018 0920   ALT 45 11/20/2017 0931   BILITOT 0.8 04/24/2019 1631   BILITOT 0.7 12/01/2018 0920   BILITOT 0.66 11/20/2017 0931       Radiological Studies: IMPRESSION: LEFT lower quadrant ileostomy.  Dilated colon proximal to herniation of the descending colon into a large LEFT spigelian hernia consistent with a degree of colonic obstruction.  Additional herniation of small bowel loops into LEFT spigelian hernia without small bowel  dilatation/obstruction.  Thin gas and fluid collection in the RIGHT upper quadrant anterior to the RIGHT colon 5.8 x 0.8 cm extending questionably 5 cm length question small abscess collection, does not appear to communicate with bowel.  Abnormal soft tissue in the RIGHT pelvis contiguous with the prostate gland, question prostate cancer with local extension versus posttherapy changes; this is not significantly changed since previous study, recommend correlation with PSA and potentially follow-up PET-CT.  Impression and Plan:  70 year old man with:  1.   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   He is currently on Xtandi which she has tolerated reasonably well but potentially progressing.  His last PSA obtained [facility] was closed time with a CT scan obtained in May 2020 showed possible progression locally from his prostate gland.  Treatment options were reviewed at this time and options of therapy include continuing Xtandi given the fact that he is asymptomatic versus  consideration for systemic chemotherapy.  This time I recommended continuing Xtandi given his poor candidacy for chemotherapy I will continue to monitor his clinical status.   2. IV access: Port-A-Cath will be flushed with the next visit.  3. Androgen deprivation therapy:  He continues to receive androgen deprivation under the care of [facility] without any major concerns.  4.  Prognosis and goals of care: Therapy remains palliative and his prognosis overall poor.  His performance status is adequate however and would like to continue with aggressive therapy.  5. Follow-up: Will be in 3 months    I provided 20  minutes of non face-to-face telephone visit time during this encounter, and > 50% was spent reviewing his laboratory data, imaging studies and answering questions regarding future plan of care.  Zola Button, MD 05/04/2019 9:05 AM

## 2019-05-05 ENCOUNTER — Telehealth: Payer: Self-pay | Admitting: Oncology

## 2019-05-05 NOTE — Telephone Encounter (Signed)
Tried to reach regarding schedule

## 2019-05-09 ENCOUNTER — Telehealth: Payer: Self-pay | Admitting: *Deleted

## 2019-05-09 NOTE — Telephone Encounter (Signed)
Received call from patient. [phone]~ telephone.   Reports that he has completed Doxycycline from 04/20/2019 with no improvement to cough. States that cough continues to be productive with yellow colored mucus. States that cough worsens at night. Reports that he is taking Mucinex DM for cough with little to no relief.   Denies fever, SOB, body aches.  MD please advise.

## 2019-05-09 NOTE — Telephone Encounter (Signed)
Call placed to patient and patient made aware.   Patient states he will try the delsym first.

## 2019-05-09 NOTE — Telephone Encounter (Signed)
No further antibiotics at this time. His xray was negative on 5/3 Can change to delsym I can send robitusin codiene but he cant take with his pain meds

## 2019-05-11 ENCOUNTER — Other Ambulatory Visit: Payer: Self-pay | Admitting: Oncology

## 2019-05-11 DIAGNOSIS — C61 Malignant neoplasm of prostate: Secondary | ICD-10-CM

## 2019-05-19 DIAGNOSIS — C7951 Secondary malignant neoplasm of bone: Secondary | ICD-10-CM | POA: Diagnosis not present

## 2019-05-19 MED FILL — XTANDI 40 MG CAPSULE: 40 | 30 days supply | Qty: 120 | Fill #0

## 2019-05-23 ENCOUNTER — Other Ambulatory Visit: Payer: Self-pay | Admitting: Family Medicine

## 2019-05-23 NOTE — Telephone Encounter (Signed)
Refill on oxycodone to [facility]

## 2019-05-25 ENCOUNTER — Other Ambulatory Visit: Payer: Self-pay

## 2019-05-25 MED ORDER — OXYCODONE-ACETAMINOPHEN 10-325 MG PO TABS: 1.0000 | ORAL_TABLET | Freq: Three times a day (TID) | ORAL | 0 refills | Status: DC | PRN

## 2019-05-28 ENCOUNTER — Other Ambulatory Visit: Payer: Self-pay

## 2019-05-28 ENCOUNTER — Encounter (HOSPITAL_COMMUNITY): Payer: Self-pay | Admitting: Emergency Medicine

## 2019-05-28 ENCOUNTER — Emergency Department (HOSPITAL_COMMUNITY)
Admission: EM | Admit: 2019-05-28 | Discharge: 2019-05-29 | Disposition: A | Discharge status: Home / Self Care | Payer: Medicare Other | Attending: Emergency Medicine | Admitting: Emergency Medicine

## 2019-05-28 DIAGNOSIS — Z03818 Encounter for observation for suspected exposure to other biological agents ruled out: Secondary | ICD-10-CM | POA: Diagnosis not present

## 2019-05-28 DIAGNOSIS — C7989 Secondary malignant neoplasm of other specified sites: Secondary | ICD-10-CM | POA: Diagnosis not present

## 2019-05-28 DIAGNOSIS — M5489 Other dorsalgia: Secondary | ICD-10-CM | POA: Diagnosis not present

## 2019-05-28 DIAGNOSIS — Z85830 Personal history of malignant neoplasm of bone: Secondary | ICD-10-CM | POA: Insufficient documentation

## 2019-05-28 DIAGNOSIS — K439 Ventral hernia without obstruction or gangrene: Secondary | ICD-10-CM | POA: Diagnosis not present

## 2019-05-28 DIAGNOSIS — M549 Dorsalgia, unspecified: Secondary | ICD-10-CM | POA: Diagnosis not present

## 2019-05-28 DIAGNOSIS — Z8546 Personal history of malignant neoplasm of prostate: Secondary | ICD-10-CM | POA: Insufficient documentation

## 2019-05-28 DIAGNOSIS — Z20828 Contact with and (suspected) exposure to other viral communicable diseases: Secondary | ICD-10-CM | POA: Insufficient documentation

## 2019-05-28 DIAGNOSIS — Z87891 Personal history of nicotine dependence: Secondary | ICD-10-CM | POA: Diagnosis not present

## 2019-05-28 DIAGNOSIS — R109 Unspecified abdominal pain: Secondary | ICD-10-CM | POA: Diagnosis present

## 2019-05-28 DIAGNOSIS — R1084 Generalized abdominal pain: Secondary | ICD-10-CM | POA: Diagnosis not present

## 2019-05-28 DIAGNOSIS — Z79899 Other long term (current) drug therapy: Secondary | ICD-10-CM | POA: Diagnosis not present

## 2019-05-28 DIAGNOSIS — R531 Weakness: Secondary | ICD-10-CM | POA: Diagnosis not present

## 2019-05-28 DIAGNOSIS — C801 Malignant (primary) neoplasm, unspecified: Secondary | ICD-10-CM | POA: Diagnosis not present

## 2019-05-28 DIAGNOSIS — R590 Localized enlarged lymph nodes: Secondary | ICD-10-CM | POA: Diagnosis not present

## 2019-05-28 MED ORDER — SODIUM CHLORIDE 0.9 % IV BOLUS
1000.0000 mL | Freq: Once | INTRAVENOUS | Status: AC
  Administered 2019-05-29: 1000 mL via INTRAVENOUS

## 2019-05-28 MED ORDER — MORPHINE SULFATE (PF) 4 MG/ML IV SOLN
4.0000 mg | Freq: Once | INTRAVENOUS | Status: AC
  Administered 2019-05-29: 4 mg via INTRAVENOUS
  Filled 2019-05-28: qty 1

## 2019-05-28 MED ORDER — ONDANSETRON HCL 4 MG/2ML IJ SOLN
4.0000 mg | Freq: Once | INTRAMUSCULAR | Status: AC
  Administered 2019-05-29: 01:00:00 4 mg via INTRAVENOUS
  Filled 2019-05-28: qty 2

## 2019-05-28 NOTE — ED Triage Notes (Signed)
Pt presents from home with complaint of abdominal pain and back pain since this morning. Pt has hx of metastatic bone cancer along with chron's disease with colostomy.

## 2019-05-29 ENCOUNTER — Emergency Department (HOSPITAL_COMMUNITY): Payer: Medicare Other

## 2019-05-29 ENCOUNTER — Encounter (HOSPITAL_COMMUNITY): Payer: Self-pay

## 2019-05-29 DIAGNOSIS — C801 Malignant (primary) neoplasm, unspecified: Secondary | ICD-10-CM | POA: Diagnosis not present

## 2019-05-29 DIAGNOSIS — C7989 Secondary malignant neoplasm of other specified sites: Secondary | ICD-10-CM | POA: Diagnosis not present

## 2019-05-29 DIAGNOSIS — R590 Localized enlarged lymph nodes: Secondary | ICD-10-CM | POA: Diagnosis not present

## 2019-05-29 DIAGNOSIS — K439 Ventral hernia without obstruction or gangrene: Secondary | ICD-10-CM | POA: Diagnosis not present

## 2019-05-29 LAB — CBC WITH DIFFERENTIAL/PLATELET
Abs Immature Granulocytes: 0.05 10*3/uL (ref 0.00–0.07)
Basophils Absolute: 0.1 10*3/uL (ref 0.0–0.1)
Basophils Relative: 1 %
Eosinophils Absolute: 0.7 10*3/uL — ABNORMAL HIGH (ref 0.0–0.5)
Eosinophils Relative: 5 %
HCT: 34 % — ABNORMAL LOW (ref 39.0–52.0)
Hemoglobin: 10.6 g/dL — ABNORMAL LOW (ref 13.0–17.0)
Immature Granulocytes: 0 %
Lymphocytes Relative: 18 %
Lymphs Abs: 2.2 10*3/uL (ref 0.7–4.0)
MCH: 31.3 pg (ref 26.0–34.0)
MCHC: 31.2 g/dL (ref 30.0–36.0)
MCV: 100.3 fL — ABNORMAL HIGH (ref 80.0–100.0)
Monocytes Absolute: 0.7 10*3/uL (ref 0.1–1.0)
Monocytes Relative: 6 %
Neutro Abs: 8.6 10*3/uL — ABNORMAL HIGH (ref 1.7–7.7)
Neutrophils Relative %: 70 %
Platelets: 429 10*3/uL — ABNORMAL HIGH (ref 150–400)
RBC: 3.39 MIL/uL — ABNORMAL LOW (ref 4.22–5.81)
RDW: 13.9 % (ref 11.5–15.5)
WBC: 12.3 10*3/uL — ABNORMAL HIGH (ref 4.0–10.5)
nRBC: 0 % (ref 0.0–0.2)

## 2019-05-29 LAB — COMPREHENSIVE METABOLIC PANEL
ALT: 12 U/L (ref 0–44)
AST: 28 U/L (ref 15–41)
Albumin: 3.2 g/dL — ABNORMAL LOW (ref 3.5–5.0)
Alkaline Phosphatase: 77 U/L (ref 38–126)
Anion gap: 8 (ref 5–15)
BUN: 12 mg/dL (ref 8–23)
CO2: 24 mmol/L (ref 22–32)
Calcium: 9.1 mg/dL (ref 8.9–10.3)
Chloride: 105 mmol/L (ref 98–111)
Creatinine, Ser: 0.86 mg/dL (ref 0.61–1.24)
GFR calc Af Amer: >60 mL/min (ref >60)
GFR calc non Af Amer: >60 mL/min (ref >60)
Glucose, Bld: 98 mg/dL (ref 70–99)
Potassium: 4.2 mmol/L (ref 3.5–5.1)
Sodium: 137 mmol/L (ref 135–145)
Total Bilirubin: 0.5 mg/dL (ref 0.3–1.2)
Total Protein: 8.6 g/dL — ABNORMAL HIGH (ref 6.5–8.1)

## 2019-05-29 LAB — URINALYSIS, ROUTINE W REFLEX MICROSCOPIC
Bacteria, UA: NONE SEEN
Bilirubin Urine: NEGATIVE
Glucose, UA: NEGATIVE mg/dL
Hgb urine dipstick: NEGATIVE
Ketones, ur: NEGATIVE mg/dL
Nitrite: NEGATIVE
Protein, ur: NEGATIVE mg/dL
Specific Gravity, Urine: 1.024 (ref 1.005–1.030)
pH: 5 (ref 5.0–8.0)

## 2019-05-29 LAB — LIPASE, BLOOD: Lipase: 22 U/L (ref 11–51)

## 2019-05-29 LAB — SARS CORONAVIRUS 2 BY RT PCR (HOSPITAL ORDER, PERFORMED IN ~~LOC~~ HOSPITAL LAB): SARS Coronavirus 2: NEGATIVE

## 2019-05-29 MED ORDER — OXYCODONE-ACETAMINOPHEN 5-325 MG PO TABS
1.0000 | ORAL_TABLET | Freq: Once | ORAL | Status: AC
  Administered 2019-05-29: 1 via ORAL
  Filled 2019-05-29: qty 1

## 2019-05-29 MED ORDER — SODIUM CHLORIDE (PF) 0.9 % IJ SOLN
INTRAMUSCULAR | Status: AC
  Filled 2019-05-29: qty 50

## 2019-05-29 MED ORDER — IOHEXOL 300 MG/ML  SOLN
30.0000 mL | Freq: Once | INTRAMUSCULAR | Status: AC | PRN
  Administered 2019-05-29: 30 mL via ORAL

## 2019-05-29 MED ORDER — OXYCODONE-ACETAMINOPHEN 5-325 MG PO TABS: 1.0000 | ORAL_TABLET | Freq: Four times a day (QID) | ORAL | 0 refills | Status: DC | PRN

## 2019-05-29 MED ORDER — IOHEXOL 300 MG/ML  SOLN
100.0000 mL | Freq: Once | INTRAMUSCULAR | Status: AC | PRN
  Administered 2019-05-29: 100 mL via INTRAVENOUS

## 2019-05-29 NOTE — ED Provider Notes (Signed)
[facility] DEPT Provider Note   CSN: [id number] Arrival date & time: 05/28/19  2237    History   Chief Complaint Chief Complaint  Patient presents with   Abdominal Pain   Back Pain    HPI Daniel Howard is a 63 y.o. male.     HPI  This is a 63 year old male with a history of Crohn's disease, metastatic prostate cancer, small bowel obstruction who presents with abdominal pain.  Patient reports 1 day history of abdominal pain and back pain.  He states "I think I have another blockage."  Denies any nausea or vomiting.  Last bowel movement was yesterday.  Reports 8 out of 10 pain in the mid and right side of the abdomen.  It is nonradiating.  It is dull.  He has not taken anything for his symptoms.  He denies any fever, cough, chest pain, shortness of breath.  He denies any urinary symptoms.  Patient was recently admitted in early May for small bowel obstruction.  Obstruction resolved without surgical intervention.  Past Medical History:  Diagnosis Date   Abnormal finding of biliary tract    MRCP shows pancreatic/biliary tract dilation. EUS 2010 confirmed dilation but no chronic pancreaitis or mass. Vascular ectasia crimpoing distal CBD.    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.s/p transverse loop colostomy secondary to stricture 1992., followed by end-transverse ostomy, followed by right hemicolectomy, followed  by takedown & ileostomy   Duodenal ulcer 2010   nsaids   History of blood transfusion 1992   "related to colon OR"   Peristomal hernia    Prostate cancer ([place]) 2018   SBO (small bowel obstruction) ([place]) 11/10/2018   Small bowel obstruction ([place]) 10/2017; 11/21/2017; 02/16/2018   Spigelian hernia    bilateral    Patient Active Problem  List   Diagnosis Date Noted   Intra-abdominal abscess ([place])    Incarcerated hernia 01/09/2019   Palliative care encounter    Small bowel obstruction due to adhesions ([place]) 12/29/2018   Small bowel obstruction ([place]) 12/03/2018   Partial small bowel obstruction ([place]) 11/01/2018   SBO (small bowel obstruction) ([place]) 09/30/2018   Iron deficiency anemia 07/03/2018   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   Nausea without vomiting 11/10/2017   Incisional hernia 09/22/2017   Abdominal pain of multiple sites 09/22/2017   Goals of care, counseling/discussion 04/02/2017   Prostate cancer metastatic to bone ([place]) 04/02/2017   Elevated PSA 03/04/2017   Osteopenia 11/03/2016   Pelvic mass in male 09/04/2016   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   Exacerbation of Crohn's disease ([place]) 09/04/2016   Family history of colon cancer 10/12/2014   Protein-calorie malnutrition, severe ([place]) 07/27/2014   Loss of weight 06/09/2014   Crohn's disease of both small and large intestine with complication ([place]) 19/37/9024   Boils 02/11/2013   Ventral hernia 12/11/2009   Anemia 12/03/2009   Regional enteritis/Crohn's 01/25/2007    Past Surgical History:  Procedure Laterality Date   APPENDECTOMY  1992   at time of exp laparotomy at which time he was noted to have fistulizing Crohn's rather than UC   COLON SURGERY     COLONOSCOPY [name] 08/23/2014   OXB:DZHGDJM proctoscopy with possible fistulous opening in thebase of rectal/anal stump.     COLOSTOMY  1992   transverse loop  colostomy secondary to a stricture   ESOPHAGOGASTRODUODENOSCOPY  05/2009   SLF: multiple antral erosions, large ulcer at ansatomosis (postsurgical changes at duodenal bulb and second portion of duodenum) BX c/x NSAIDS.   EUS  10/04/2009   Dr. Estill Bakes dilated CBD and main pancreatic duct.  No pancreatic   EXPLORATORY LAPAROTOMY  1992   FLEXIBLE SIGMOIDOSCOPY  1988   Dr. Laural Golden- suggested rohn's disease but the  biopsies were not collaborative.   FLEXIBLE SIGMOIDOSCOPY [name] 09/08/2016   Procedure: FLEXIBLE SIGMOIDOSCOPY;  Surgeon: Wonda Horner, MD;  Location: [facility] ENDOSCOPY;  Service: Gastroenterology;  Laterality: [name];   [place]   incarcerated periostial hernia with additional surgery in [place] RIGHT  04/03/2017   IR US GUIDE VASC ACCESS RIGHT  04/03/2017        Home Medications    Prior to Admission medications   Medication Sig Start Date End Date Taking? Authorizing Provider  Adalimumab (HUMIRA PEN) 40 MG/0.8ML PNKT Inject 1 Syringe into the skin once a week. Patient taking differently: Inject 40 mg into the skin every Wednesday.  09/01/18   Annitta Needs, NP  albuterol (PROVENTIL HFA) 108 (90 Base) MCG/ACT inhaler Inhale 2 puffs into the lungs every 6 (six) hours as needed for wheezing or shortness of breath.    [provider]  budesonide (ENTOCORT EC) 3 MG 24 hr capsule TAKE 3 CAPSULES BY MOUTH EVERY DAY Patient taking differently: Take 9 mg by mouth daily.  04/04/19   Mahala Menghini, PA-C  Calcium Carb-Cholecalciferol (CALCIUM 600-D PO) Take 2 tablets by mouth daily.    [provider]  dextromethorphan-guaiFENesin (MUCINEX DM) 30-600 MG 12hr tablet Take 1 tablet by mouth 2 (two) times daily as needed for cough. 04/26/19   Wilber Oliphant, MD  ferrous sulfate 325 (65 FE) MG tablet Take 325 mg by mouth daily with breakfast.    [provider]  lidocaine-prilocaine (EMLA) cream Apply 1 application topically daily as needed (when accessing port).     [provider]  mirtazapine (REMERON) 15 MG tablet TAKE 1 TABLET BY MOUTH AT BEDTIME Patient taking differently: Take 15 mg by mouth at bedtime.  03/21/19   [place], [name], MD  Multiple Vitamins-Minerals (CENTRUM SILVER 50+MEN) TABS Take 1 tablet by mouth daily with breakfast.    [provider]    oxyCODONE-acetaminophen (PERCOCET/ROXICET) 5-325 MG tablet Take 1 tablet by mouth every 6 (six) hours as needed for severe pain. 05/29/19   Danta Baumgardner, Barbette Hair, MD  simethicone (MYLICON) 80 MG chewable tablet Chew 80 mg by mouth 3 (three) times daily as needed (gas/flatulence).     [provider]  XTANDI 40 MG capsule TAKE 4 CAPSULES (160 MG TOTAL) BY MOUTH DAILY. 05/11/19   [name], MD    Family History Family History  Problem Relation Age of Onset   Cancer Father        prostate    Prostate cancer Father    Colon cancer Father 62   Hypertension Sister    Cancer Sister    Depression Sister    Breast cancer Sister    COPD Sister    Aneurysm Brother        deceased, brain aneurysm    Social History Social History   Tobacco Use   Smoking status: Former Smoker    Packs/day: 0.75  Years: 32.00    Pack years: 24.00    Types: Cigarettes    Last attempt to quit: 12/21/2009    Years since quitting: 9.4   Smokeless tobacco: Never Used  Substance Use Topics   Alcohol use: No    Alcohol/week: 0.0 standard drinks    Comment: Former drinker   Drug use: No     Allergies   Penicillins   Review of Systems Review of Systems  Constitutional: Negative for fever.  Respiratory: Negative for shortness of breath.   Cardiovascular: Negative for chest pain.  Gastrointestinal: Positive for abdominal pain. Negative for constipation, diarrhea, nausea and vomiting.  Genitourinary: Negative for dysuria.  Musculoskeletal: Positive for back pain.  Skin: Negative for wound.  Neurological: Negative for light-headedness.  All other systems reviewed and are negative.    Physical Exam Updated Vital Signs BP 122/78    Pulse 78    Temp 98.7 F (37.1 C) (Oral)    Resp 19    Ht 1.638 m (5' 4.5")    Wt 53.5 kg    SpO2 100%    BMI 19.94 kg/m   Physical Exam Vitals signs and nursing note reviewed.  Constitutional:      Appearance: He is well-developed. He is not  ill-appearing.     Comments: Thin, chronically ill-appearing  HENT:     Head: Normocephalic and atraumatic.  Neck:     Musculoskeletal: Neck supple.  Cardiovascular:     Rate and Rhythm: Normal rate and regular rhythm.     Heart sounds: Normal heart sounds. No murmur.  Pulmonary:     Effort: Pulmonary effort is normal. No respiratory distress.     Breath sounds: Normal breath sounds. No wheezing.  Abdominal:     General: Bowel sounds are normal.     Palpations: Abdomen is soft.     Tenderness: There is generalized abdominal tenderness. There is no guarding or rebound.     Comments: Extensive abdominal scarring noted, hernia noted left lower quadrant, no overlying skin changes, easily reducible and large, right lower quadrant ostomy with soft brown stool noted  Lymphadenopathy:     Cervical: No cervical adenopathy.  Skin:    General: Skin is warm and dry.  Neurological:     Mental Status: He is alert and oriented to person, place, and time.  Psychiatric:        Mood and Affect: Mood normal.      ED Treatments / Results  Labs (all labs ordered are listed, but only abnormal results are displayed) Labs Reviewed  CBC WITH DIFFERENTIAL/PLATELET - Abnormal; Notable for the following components:      Result Value   WBC 12.3 (*)    RBC 3.39 (*)    Hemoglobin 10.6 (*)    HCT 34.0 (*)    MCV 100.3 (*)    Platelets 429 (*)    Neutro Abs 8.6 (*)    Eosinophils Absolute 0.7 (*)    All other components within normal limits  COMPREHENSIVE METABOLIC PANEL - Abnormal; Notable for the following components:   Total Protein 8.6 (*)    Albumin 3.2 (*)    All other components within normal limits  URINALYSIS, ROUTINE W REFLEX MICROSCOPIC - Abnormal; Notable for the following components:   Leukocytes,Ua SMALL (*)    All other components within normal limits  SARS CORONAVIRUS 2 (HOSPITAL ORDER, [facility])  LIPASE, BLOOD    EKG None  Radiology Ct Abdomen  Pelvis W  Contrast  Result Date: 05/29/2019 CLINICAL DATA:  Abdominal pain EXAM: CT ABDOMEN AND PELVIS WITH CONTRAST TECHNIQUE: Multidetector CT imaging of the abdomen and pelvis was performed using the standard protocol following bolus administration of intravenous contrast. CONTRAST:  140m OMNIPAQUE IOHEXOL 300 MG/ML SOLN, 390mOMNIPAQUE IOHEXOL 300 MG/ML SOLN COMPARISON:  04/24/2019 FINDINGS: LOWER CHEST: There is no basilar pleural or apical pericardial effusion. HEPATOBILIARY: The hepatic contours and density are normal. There is no intra- or extrahepatic biliary dilatation. The gallbladder is normal. PANCREAS: The pancreatic parenchymal contours are normal and there is no ductal dilatation. There is no peripancreatic fluid collection. SPLEEN: Normal. ADRENALS/URINARY TRACT: --Adrenal glands: Normal. --Right kidney/ureter: No hydronephrosis, nephroureterolithiasis, perinephric stranding or solid renal mass. --Left kidney/ureter: No hydronephrosis, nephroureterolithiasis, perinephric stranding or solid renal mass. --Urinary bladder: Normal for degree of distention STOMACH/BOWEL: Large spigelian hernia at the left lower quadrant is unchanged. This contains a large amount of nondilated small bowel. It also contains a portion of fluid-filled descending colon. There is a right lower quadrant ileostomy. There is a large volume of proximal colonic stool. No dilated small bowel. VASCULAR/LYMPHATIC: Normal course and caliber of the major abdominal vessels. There are multiple enlarged left para-aortic lymph nodes, the largest of which measures 2.1 cm, unchanged. The distribution of nodes is unchanged. There are multiple enlarged nodes in the right mesentery, also unchanged. REPRODUCTIVE: No abnormal soft tissue is again seen adjacent to the prostate gland, measuring approximately 5.6 x 3.5 cm (series 2, image 68). MUSCULOSKELETAL. Bilateral sacroiliac ankylosis. Multiple sclerotic lesions of the pelvis and proximal  right femur. OTHER: None. IMPRESSION: 1. Large left lower quadrants Spigelian hernia containing a large amount of nondilated small bowel and fluid-filled segment of descending colon. The colon proximal to the hernia sac is dilated and stool-filled. 2. Right lower quadrant ileostomy.  No dilated small bowel. 3. Masslike soft tissue along the right lateral aspect of the prostate gland, which remains concerning for extension of prostate carcinoma. 4. Unchanged left para-aortic lymphadenopathy. 5. Multiple sclerotic metastases of the pelvis. Electronically Signed   By: KeUlyses Jarred.D.   On: 05/29/2019 04:26    Procedures Procedures (including critical care time)  Medications Ordered in ED Medications  sodium chloride (PF) 0.9 % injection (has no administration in time range)  sodium chloride 0.9 % bolus 1,000 mL (0 mLs Intravenous Stopped 05/29/19 0211)  morphine 4 MG/ML injection 4 mg (4 mg Intravenous Given 05/29/19 0035)  ondansetron (ZOFRAN) injection 4 mg (4 mg Intravenous Given 05/29/19 0035)  iohexol (OMNIPAQUE) 300 MG/ML solution 30 mL (30 mLs Oral Contrast Given 05/29/19 0337)  iohexol (OMNIPAQUE) 300 MG/ML solution 100 mL (100 mLs Intravenous Contrast Given 05/29/19 0336)  oxyCODONE-acetaminophen (PERCOCET/ROXICET) 5-325 MG per tablet 1 tablet (1 tablet Oral Given 05/29/19 0527)     Initial Impression / Assessment and Plan / ED Course  I have reviewed the triage vital signs and the nursing notes.  Pertinent labs & imaging results that were available during my care of the patient were reviewed by me and considered in my medical decision making (see chart for details).        Patient presents with abdominal pain.  Complicated past medical and surgical abdominal history.  He is overall nontoxic-appearing.  Clinically he does appear somewhat dry.  He feels he may be obstructive although he is not actively well-appearing thin and has had stools in his ostomy.  Patient was given pain and nausea  medication.  Lab work-up largely reassuring.  CT  scan obtained.  There is no definite obstruction on CT.  He continues to have evidence of extension of prostate carcinoma and sclerotic metastasis of the pelvis.  Patient was able to tolerate fluids.  Will discharge home with short course of pain medication.  After history, exam, and medical workup I feel the patient has been appropriately medically screened and is safe for discharge home. Pertinent diagnoses were discussed with the patient. Patient was given return precautions.   Final Clinical Impressions(s) / ED Diagnoses   Final diagnoses:  Generalized abdominal pain    ED Discharge Orders         Ordered    oxyCODONE-acetaminophen (PERCOCET/ROXICET) 5-325 MG tablet  Every 6 hours PRN     05/29/19 0551           Merryl Hacker, MD 05/29/19 [phone]

## 2019-05-29 NOTE — Discharge Instructions (Addendum)
You were seen today for abdominal pain.  Your CT does not show any evidence of acute obstruction.  Take medications as prescribed.  If you develop nausea, vomiting, no output from your ostomy, persistent pain, you need to be reevaluated.

## 2019-06-01 DIAGNOSIS — Z933 Colostomy status: Secondary | ICD-10-CM | POA: Diagnosis not present

## 2019-06-01 DIAGNOSIS — K5090 Crohn's disease, unspecified, without complications: Secondary | ICD-10-CM | POA: Diagnosis not present

## 2019-06-07 ENCOUNTER — Other Ambulatory Visit: Payer: Self-pay

## 2019-06-08 NOTE — Telephone Encounter (Signed)
RMR is not in the office the month of July, can I put him with RMR in his next available?

## 2019-06-08 NOTE — Telephone Encounter (Signed)
LSL, spoke with pts spouse and she said RMR didn't have him on it. She mentioned that RMR didn't want pt to stay off of Humira a long time but she isn't sure if he needs to restart or hold off. Pt is still coughing.   Please schedule 4 week f/u with RMR.

## 2019-06-08 NOTE — Telephone Encounter (Signed)
Per RMR OV note 05/03/19 patient's Humira was on hold. Plans to see him back in 8 weeks.   PATIENT NEEDS OV WITH RMR IN 4 WEEKS (APPEARS APPT WAS NEVER MADE).   FIND OUT IF PATIENT IS ON HUMIRA OR NOT, I WAS UNDER IMPRESSION THAT IT WAS STILL ON HOLD.

## 2019-06-09 ENCOUNTER — Encounter (HOSPITAL_COMMUNITY): Payer: Self-pay | Admitting: Emergency Medicine

## 2019-06-09 ENCOUNTER — Ambulatory Visit (HOSPITAL_COMMUNITY)
Admission: EM | Admit: 2019-06-09 | Discharge: 2019-06-09 | Disposition: A | Discharge status: Home / Self Care | Payer: Medicare Other | Attending: Family Medicine | Admitting: Family Medicine

## 2019-06-09 ENCOUNTER — Other Ambulatory Visit: Payer: Self-pay

## 2019-06-09 ENCOUNTER — Ambulatory Visit (INDEPENDENT_AMBULATORY_CARE_PROVIDER_SITE_OTHER): Payer: Medicare Other

## 2019-06-09 DIAGNOSIS — R05 Cough: Secondary | ICD-10-CM

## 2019-06-09 DIAGNOSIS — R0981 Nasal congestion: Secondary | ICD-10-CM

## 2019-06-09 DIAGNOSIS — J22 Unspecified acute lower respiratory infection: Secondary | ICD-10-CM | POA: Diagnosis not present

## 2019-06-09 DIAGNOSIS — R059 Cough, unspecified: Secondary | ICD-10-CM

## 2019-06-09 MED ORDER — DOXYCYCLINE HYCLATE 100 MG PO CAPS: 100.0000 mg | ORAL_CAPSULE | Freq: Two times a day (BID) | ORAL | 0 refills | Status: AC

## 2019-06-09 MED ORDER — PREDNISONE 20 MG PO TABS: 40.0000 mg | ORAL_TABLET | Freq: Every day | ORAL | 0 refills | Status: AC

## 2019-06-09 MED ORDER — BENZONATATE 200 MG PO CAPS: 200.0000 mg | ORAL_CAPSULE | Freq: Two times a day (BID) | ORAL | 0 refills | Status: AC | PRN

## 2019-06-09 MED ORDER — DM-GUAIFENESIN ER 30-600 MG PO TB12: 1.0000 | ORAL_TABLET | Freq: Two times a day (BID) | ORAL | 0 refills | Status: DC

## 2019-06-09 NOTE — ED Triage Notes (Signed)
PT reports a cough for 3 months. Sometimes productive. PT reports sputum is sometimes green.

## 2019-06-09 NOTE — ED Provider Notes (Signed)
[facility]    CSN: [id number] Arrival date & time: 06/09/19  4481     History   Chief Complaint Chief Complaint  Patient presents with   Cough    HPI Daniel Howard is a 63 y.o. male history of metastatic prostate cancer to bone, small bowel obstruction, osteopenia, Crohn's disease, presenting today for evaluation of a cough.  Patient states that he has had a cough for the past 3 months.  Cough is occasionally productive.  He was seen previously in April with negative chest x-ray, but initiated on azithromycin.  Has been using over-the-counter Robitussin without relief.  Denies any chest pain or shortness of breath.  Denies significant worsening of his cough recently, but does note that his sputum has changed to a green color.  He denies any fevers chills or body aches.  Denies any known exposure to COVID, denies any sick contacts.  He has had 2 negative COVID tests.  He is currently in remission from prostate cancer, but continues with androgen deprivation therapy.  He is also on Humira.  Notes that he thinks his symptoms are bronchitis.  Notes that he previously had wheezing, but denies persistent wheezing.  Denies history of smoking.  Denies asthma and COPD.  HPI  Past Medical History:  Diagnosis Date   Abnormal finding of biliary tract    MRCP shows pancreatic/biliary tract dilation. EUS 2010 confirmed dilation but no chronic pancreaitis or mass. Vascular ectasia crimpoing distal CBD.    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.s/p transverse loop colostomy secondary to stricture 1992., followed by end-transverse ostomy, followed by right hemicolectomy, followed  by takedown & ileostomy   Duodenal ulcer 2010   nsaids   History of blood transfusion 1992   "related to colon  OR"   Peristomal hernia    Prostate cancer ([place]) 2018   SBO (small bowel obstruction) ([place]) 11/10/2018   Small bowel obstruction ([place]) 10/2017; 11/21/2017; 02/16/2018   Spigelian hernia    bilateral    Patient Active Problem List   Diagnosis Date Noted   Intra-abdominal abscess ([place])    Incarcerated hernia 01/09/2019   Palliative care encounter    Small bowel obstruction due to adhesions ([place]) 12/29/2018   Small bowel obstruction ([place]) 12/03/2018   Partial small bowel obstruction ([place]) 11/01/2018   SBO (small bowel obstruction) ([place]) 09/30/2018   Iron deficiency anemia 07/03/2018   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   Nausea without vomiting 11/10/2017   Incisional hernia 09/22/2017   Abdominal pain of multiple sites 09/22/2017   Goals of care, counseling/discussion 04/02/2017   Prostate cancer metastatic to bone ([place]) 04/02/2017   Elevated PSA 03/04/2017   Osteopenia 11/03/2016   Pelvic mass in male 09/04/2016   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   Exacerbation of Crohn's disease ([place]) 09/04/2016   Family history of colon cancer 10/12/2014   Protein-calorie malnutrition, severe ([place]) 07/27/2014   Loss of weight 06/09/2014   Crohn's disease of both small and large intestine with complication ([place]) 85/63/1497   Boils 02/11/2013   Ventral hernia 12/11/2009   Anemia 12/03/2009   Regional enteritis/Crohn's 01/25/2007    Past Surgical History:  Procedure Laterality Date   APPENDECTOMY  1992   at time of exp laparotomy at which time he was noted to have fistulizing Crohn's rather than  UC   COLON SURGERY     COLONOSCOPY [name] 08/23/2014   [id number] proctoscopy with possible fistulous opening in thebase of rectal/anal stump.     COLOSTOMY  1992   transverse loop colostomy secondary to a stricture   ESOPHAGOGASTRODUODENOSCOPY  05/2009   SLF: multiple antral erosions, large ulcer at ansatomosis (postsurgical changes at duodenal bulb and second portion  of duodenum) BX c/x NSAIDS.   EUS  10/04/2009   Dr. Estill Bakes dilated CBD and main pancreatic duct.  No pancreatic   EXPLORATORY LAPAROTOMY  1992   FLEXIBLE SIGMOIDOSCOPY  1988   Dr. Laural Golden- suggested rohn's disease but the biopsies were not collaborative.   FLEXIBLE SIGMOIDOSCOPY [name] 09/08/2016   Procedure: FLEXIBLE SIGMOIDOSCOPY;  Surgeon: Wonda Horner, MD;  Location: [facility] ENDOSCOPY;  Service: Gastroenterology;  Laterality: [name];   [place]   incarcerated periostial hernia with additional surgery in [place] RIGHT  04/03/2017   IR US GUIDE VASC ACCESS RIGHT  04/03/2017       Home Medications    Prior to Admission medications   Medication Sig Start Date End Date Taking? Authorizing Provider  Adalimumab (HUMIRA PEN) 40 MG/0.8ML PNKT Inject 1 Syringe into the skin once a week. Patient taking differently: Inject 40 mg into the skin every Wednesday.  09/01/18   Annitta Needs, NP  albuterol (PROVENTIL HFA) 108 (90 Base) MCG/ACT inhaler Inhale 2 puffs into the lungs every 6 (six) hours as needed for wheezing or shortness of breath.    [provider]  benzonatate (TESSALON) 200 MG capsule Take 1 capsule (200 mg total) by mouth 2 (two) times daily as needed for up to 7 days for cough. 06/09/19 06/16/19  Miangel Flom C, PA-C  budesonide (ENTOCORT EC) 3 MG 24 hr capsule TAKE 3 CAPSULES BY MOUTH EVERY DAY Patient taking differently: Take 9 mg by mouth daily.  04/04/19   Mahala Menghini, PA-C  Calcium Carb-Cholecalciferol (CALCIUM 600-D PO) Take 2 tablets by mouth daily.    [provider]  dextromethorphan-guaiFENesin (MUCINEX DM) 30-600 MG 12hr tablet Take 1 tablet by mouth 2 (two) times daily. 06/09/19   Reginald Mangels C, PA-C  doxycycline (VIBRAMYCIN) 100 MG capsule Take 1 capsule (100 mg total) by mouth 2 (two) times daily for 10 days. 06/09/19 06/19/19  Lorrie Gargan C,  PA-C  ferrous sulfate 325 (65 FE) MG tablet Take 325 mg by mouth daily with breakfast.    [provider]  lidocaine-prilocaine (EMLA) cream Apply 1 application topically daily as needed (when accessing port).     [provider]  mirtazapine (REMERON) 15 MG tablet TAKE 1 TABLET BY MOUTH AT BEDTIME Patient taking differently: Take 15 mg by mouth at bedtime.  03/21/19   Bolton, [name], MD  Multiple Vitamins-Minerals (CENTRUM SILVER 50+MEN) TABS Take 1 tablet by mouth daily with breakfast.    [provider]  oxyCODONE-acetaminophen (PERCOCET/ROXICET) 5-325 MG tablet Take 1 tablet by mouth every 6 (six) hours as needed for severe pain. 05/29/19   Horton, Barbette Hair, MD  predniSONE (DELTASONE) 20 MG tablet Take 2 tablets (40 mg total) by mouth daily for 5 days. 06/09/19 06/14/19  Elinor Kleine C, PA-C  simethicone (MYLICON) 80 MG chewable tablet Chew 80 mg by mouth 3 (three) times daily as needed (gas/flatulence).     [provider]  XTANDI 40 MG capsule TAKE 4  CAPSULES (160 MG TOTAL) BY MOUTH DAILY. 05/11/19   [name], MD    Family History Family History  Problem Relation Age of Onset   Cancer Father        prostate    Prostate cancer Father    Colon cancer Father 65   Hypertension Sister    Cancer Sister    Depression Sister    Breast cancer Sister    COPD Sister    Aneurysm Brother        deceased, brain aneurysm    Social History Social History   Tobacco Use   Smoking status: Former Smoker    Packs/day: 0.75    Years: 32.00    Pack years: 24.00    Types: Cigarettes    Quit date: 12/21/2009    Years since quitting: 9.4   Smokeless tobacco: Never Used  Substance Use Topics   Alcohol use: No    Alcohol/week: 0.0 standard drinks    Comment: Former drinker   Drug use: No     Allergies   Penicillins   Review of Systems Review of Systems  Constitutional: Negative for activity change, appetite change, chills,  fatigue and fever.  HENT: Negative for congestion, ear pain, rhinorrhea, sinus pressure, sore throat and trouble swallowing.   Eyes: Negative for discharge and redness.  Respiratory: Positive for cough. Negative for chest tightness and shortness of breath.   Cardiovascular: Negative for chest pain.  Gastrointestinal: Negative for abdominal pain, diarrhea, nausea and vomiting.  Musculoskeletal: Negative for myalgias.  Skin: Negative for rash.  Neurological: Negative for dizziness, light-headedness and headaches.     Physical Exam Triage Vital Signs ED Triage Vitals [06/09/19 1901]  Enc Vitals Group     BP 115/77     Pulse Rate 90     Resp 16     Temp 99 F (37.2 C)     Temp Source Oral     SpO2 100 %     Weight      Height      Head Circumference      Peak Flow      Pain Score 0     Pain Loc      Pain Edu?      Excl. in [place]?    No data found.  Updated Vital Signs BP 115/77    Pulse 90    Temp 99 F (37.2 C) (Oral)    Resp 16    SpO2 100%   Visual Acuity Right Eye Distance:   Left Eye Distance:   Bilateral Distance:    Right Eye Near:   Left Eye Near:    Bilateral Near:     Physical Exam Vitals signs and nursing note reviewed.  Constitutional:      Appearance: He is well-developed.     Comments: Thin male, no acute distress  HENT:     Head: Normocephalic and atraumatic.  Eyes:     Conjunctiva/sclera: Conjunctivae normal.  Neck:     Musculoskeletal: Neck supple.  Cardiovascular:     Rate and Rhythm: Normal rate and regular rhythm.     Heart sounds: No murmur.  Pulmonary:     Effort: Pulmonary effort is normal. No respiratory distress.     Breath sounds: Normal breath sounds.     Comments: Breathing comfortably at rest, CTABL, no wheezing, rales or other adventitious sounds auscultated Abdominal:     Palpations: Abdomen is soft.     Tenderness: There is no abdominal  tenderness.  Skin:    General: Skin is warm and dry.  Neurological:     General: No  focal deficit present.     Mental Status: He is alert and oriented to person, place, and time. Mental status is at baseline.      UC Treatments / Results  Labs (all labs ordered are listed, but only abnormal results are displayed) Labs Reviewed - No data to display  EKG None  Radiology Dg Chest 2 View  Result Date: 06/09/2019 CLINICAL DATA:  Cough, congestion EXAM: CHEST - 2 VIEW COMPARISON:  04/24/2019 FINDINGS: There is hyperinflation of the lungs compatible with COPD. Heart and mediastinal contours are within normal limits. No focal opacities or effusions. No acute bony abnormality. Right Port-A-Cath remains in place, unchanged. IMPRESSION: COPD.  No active disease. Electronically Signed   By: Rolm Baptise M.D.   On: 06/09/2019 20:35    Procedures Procedures (including critical care time)  Medications Ordered in UC Medications - No data to display  Initial Impression / Assessment and Plan / UC Course  I have reviewed the triage vital signs and the nursing notes.  Pertinent labs & imaging results that were available during my care of the patient were reviewed by me and considered in my medical decision making (see chart for details).     33-monthcough, hospital admission in early May.  Will repeat chest x-ray to rule out pneumonia.  X-ray stable from previous.  Will place on another course of antibiotics, will provide doxycycline to cover atypicals, short course of prednisone, 40 mg daily x5 days with food.  Also refilled Mucinex and Tessalon to further use for symptoms.  Reported to previous COVID tests that have been negative without recent worsening of symptoms.  Will defer further testing at this time.  Advised to follow-up if developing any fevers chills or body aches, worsening shortness of breath. Continue to monitor,Discussed strict return precautions. Patient verbalized understanding and is agreeable with plan.  Final Clinical Impressions(s) / UC Diagnoses   Final  diagnoses:  Lower respiratory infection (e.g., bronchitis, pneumonia, pneumonitis, pulmonitis)  Cough     Discharge Instructions     Chest xray remains stable from previous Begin doxycycline twice daily for 10 days Prednisone 40 mg daily for 5 days with food Tessalon twice daily as needed for cough Mucinex twice daily for congestion/cough  Continue to monitor, follow up if developing fever, worsening cough, shortness of breath    ED Prescriptions    Medication Sig Dispense Auth. Provider   doxycycline (VIBRAMYCIN) 100 MG capsule Take 1 capsule (100 mg total) by mouth 2 (two) times daily for 10 days. 20 capsule Shamikia Linskey C, PA-C   predniSONE (DELTASONE) 20 MG tablet Take 2 tablets (40 mg total) by mouth daily for 5 days. 10 tablet Haylin Camilli C, PA-C   benzonatate (TESSALON) 200 MG capsule Take 1 capsule (200 mg total) by mouth 2 (two) times daily as needed for up to 7 days for cough. 28 capsule Emilene Roma C, PA-C   dextromethorphan-guaiFENesin (MUCINEX DM) 30-600 MG 12hr tablet Take 1 tablet by mouth 2 (two) times daily. 20 tablet Timoth Schara, HDurhamC, PA-C     Controlled Substance Prescriptions  Controlled Substance Registry consulted? Not Applicable   WJanith Lima PVermont06/18/20 2039

## 2019-06-09 NOTE — Discharge Instructions (Signed)
Chest xray remains stable from previous Begin doxycycline twice daily for 10 days Prednisone 40 mg daily for 5 days with food Tessalon twice daily as needed for cough Mucinex twice daily for congestion/cough  Continue to monitor, follow up if developing fever, worsening cough, shortness of breath

## 2019-06-12 MED ORDER — HUMIRA PEN 40 MG/0.8ML ~~LOC~~ PNKT: 1.0000 | PEN_INJECTOR | SUBCUTANEOUS | 3 refills | Status: AC

## 2019-06-12 NOTE — Telephone Encounter (Signed)
Discussed with RMR. Start Humira back if no active infection. He said to schedule him in 07/2019 with RMR.   Elmo Putt, please let patient know that he should hold off on Humira until he has completed antibiotics for his cough. At that point, as long as he does not have signs of infection, he can resume his Humira. I will send in rx so he can have when ready.  He will need to see rmr in 07/2019.

## 2019-06-13 ENCOUNTER — Other Ambulatory Visit: Payer: Self-pay | Admitting: Oncology

## 2019-06-13 ENCOUNTER — Encounter: Payer: Self-pay | Admitting: Internal Medicine

## 2019-06-13 DIAGNOSIS — C61 Malignant neoplasm of prostate: Secondary | ICD-10-CM

## 2019-06-13 NOTE — Telephone Encounter (Signed)
Noted. Spoke with Mrs. Luchsinger and she is aware of RMR's recommendations. Pt is scheduled for 08/23/2019.

## 2019-06-21 DIAGNOSIS — C7951 Secondary malignant neoplasm of bone: Secondary | ICD-10-CM | POA: Diagnosis not present

## 2019-06-21 DIAGNOSIS — Z5111 Encounter for antineoplastic chemotherapy: Secondary | ICD-10-CM | POA: Diagnosis not present

## 2019-06-23 ENCOUNTER — Other Ambulatory Visit: Payer: Self-pay | Admitting: Family Medicine

## 2019-06-23 ENCOUNTER — Other Ambulatory Visit: Payer: Self-pay | Admitting: *Deleted

## 2019-06-23 MED ORDER — OXYCODONE-ACETAMINOPHEN 10-325 MG PO TABS: 1.0000 | ORAL_TABLET | Freq: Three times a day (TID) | ORAL | 0 refills | Status: DC | PRN

## 2019-06-23 MED FILL — XTANDI 40 MG CAPSULE: 40 | 30 days supply | Qty: 120 | Fill #0

## 2019-06-23 NOTE — Patient Outreach (Signed)
[facility]) Care Management  06/23/2019  Daniel Howard 02/17/1956 [id number]   Telephone Screen  Referral Date:  06/13/2019 Referral Source:  [facility] High Risk List Reason for Referral:  Assess Needs Insurance:  [employer]   Outreach Attempt:  Successful telephone outreach to patient to introduce [name] services as part of [facility] insurance plan to assist with medical needs, education, and social needs, at no cost to the patient.  HIPAA verified with patient.  Patient request I speak with his wife.  RN Health Coach spoke with wife; introduced self, role and [facility] services.  Wife stating patient continues to be followed by Palliative care and at this time declines screening and [facility] services.  Agreeable to have Successful Letter mailed.  Encouraged wife to contact [facility] in the future if feel patient will benefit from services.  Plan: RN Health Coach will send patient Successful Outreach Letter. RN Health Coach will mark patient inactive with [facility] due to patient and wife declining services.  [place] Coach [phone] [email or web address]

## 2019-06-23 NOTE — Telephone Encounter (Signed)
Patient needs refill on oxycodone  [facility]

## 2019-06-23 NOTE — Telephone Encounter (Signed)
Ok to refill??  Last office visit 04/20/2019. Last refill 05/03/2019  Of note, Oxycodone/APAP 5/347m was prescribed via CEdsel Petrin ER MD on 05/29/2019 #10 tabs.

## 2019-06-26 ENCOUNTER — Other Ambulatory Visit: Payer: Self-pay | Admitting: Gastroenterology

## 2019-07-07 DIAGNOSIS — Z933 Colostomy status: Secondary | ICD-10-CM | POA: Diagnosis not present

## 2019-07-07 DIAGNOSIS — K5090 Crohn's disease, unspecified, without complications: Secondary | ICD-10-CM | POA: Diagnosis not present

## 2019-07-11 ENCOUNTER — Telehealth: Payer: Self-pay

## 2019-07-11 NOTE — Telephone Encounter (Signed)
Labs from [place] received. Placed in EGs box in the absence of AB.

## 2019-07-13 NOTE — Telephone Encounter (Signed)
Opened in error

## 2019-07-16 ENCOUNTER — Other Ambulatory Visit: Payer: Self-pay

## 2019-07-16 ENCOUNTER — Encounter (HOSPITAL_COMMUNITY): Payer: Self-pay | Admitting: *Deleted

## 2019-07-16 ENCOUNTER — Ambulatory Visit (INDEPENDENT_AMBULATORY_CARE_PROVIDER_SITE_OTHER): Payer: Medicare Other

## 2019-07-16 ENCOUNTER — Ambulatory Visit (HOSPITAL_COMMUNITY)
Admission: EM | Admit: 2019-07-16 | Discharge: 2019-07-16 | Disposition: A | Discharge status: Home / Self Care | Payer: Medicare Other | Attending: Family Medicine | Admitting: Family Medicine

## 2019-07-16 DIAGNOSIS — R911 Solitary pulmonary nodule: Secondary | ICD-10-CM | POA: Diagnosis not present

## 2019-07-16 DIAGNOSIS — R05 Cough: Secondary | ICD-10-CM

## 2019-07-16 DIAGNOSIS — J189 Pneumonia, unspecified organism: Secondary | ICD-10-CM

## 2019-07-16 DIAGNOSIS — R0602 Shortness of breath: Secondary | ICD-10-CM | POA: Diagnosis not present

## 2019-07-16 DIAGNOSIS — R059 Cough, unspecified: Secondary | ICD-10-CM

## 2019-07-16 MED ORDER — ALBUTEROL SULFATE HFA 108 (90 BASE) MCG/ACT IN AERS: 2.0000 | INHALATION_SPRAY | RESPIRATORY_TRACT | 0 refills | Status: AC | PRN

## 2019-07-16 MED ORDER — AZITHROMYCIN 250 MG PO TABS: ORAL_TABLET | ORAL | 0 refills | Status: DC

## 2019-07-16 MED ORDER — BENZONATATE 100 MG PO CAPS: 100.0000 mg | ORAL_CAPSULE | Freq: Three times a day (TID) | ORAL | 0 refills | Status: DC | PRN

## 2019-07-16 NOTE — Discharge Instructions (Addendum)
Your chest x-ray shows today a new lung nodule for which the cause is unable to be identified with use of a chest x-ray.  You will need to follow-up with your primary care provider as she will need additional imaging such as a CT scan to further evaluate the cause of the new lung nodule.  I am treating you today for pneumonia given your symptoms of persistent cough for greater than a month now the cough is productive.  The symptoms could be that of pneumonia.  I am starting you on antibiotic and a cough suppressant.  If symptoms worsen or you develop shortness of breath and/or wheezing please go immediately to the ER.

## 2019-07-16 NOTE — ED Triage Notes (Signed)
Reports productive cough and chest congestion x approx 1.5 months.  States has had neg Covid test x 2 - most recent "about a month ago".  Denies fevers.

## 2019-07-16 NOTE — ED Provider Notes (Signed)
[place]    CSN: [id number] Arrival date & time: 07/16/19  1123      History   Chief Complaint Chief Complaint  Patient presents with  . Cough    HPI Daniel Howard is a 63 y.o. male.   HPI  Patient has a history of metastatic prostate cancer to the bone, small bowel obstruction, Crohn's disease presenting with complaint of a cough which is been present for greater than 3 months.  He was seen in clinic here to urgent care on 06/09/2019 and underwent a chest x-ray which was significant for COPD.  He was treated with a round of doxycycline, prednisone, benzonatate and prescribed Dexilant troth and guaifenesin for cough.  He reports that he took all of the prednisone, benzonatate, along with the doxycycline but was unable to get the cough syrup due to cost.  Feels that the symptoms mildly improved after treatment although never completely went away.  He reports that the cough is still persistent, accompanied by some shortness of breath and wheezing.  He has used his wife's nebulizer machine with temporary relief of shortness of breath and wheezing.  He also has an albuterol inhaler which he is using at least once or twice daily.  He has not followed up with his primary since he was seen last month.  He is currently actively undergoing cancer treatment.  Past Medical History:  Diagnosis Date  . Abnormal finding of biliary tract    MRCP shows pancreatic/biliary tract dilation. EUS 2010 confirmed dilation but no chronic pancreaitis or mass. Vascular ectasia crimpoing distal CBD.   [name] Anxiety   . 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  Duodenal ulcer 2010   nsaids  . History of blood transfusion 1992   "related to colon OR"  . Peristomal hernia   . Prostate cancer ([place]) 2018  . SBO (small bowel obstruction) ([place]) 11/10/2018  . Small bowel obstruction ([place]) 10/2017; 11/21/2017; 02/16/2018  . Spigelian hernia    bilateral    Patient Active Problem List   Diagnosis Date Noted  . Intra-abdominal abscess ([place])   . Incarcerated hernia 01/09/2019  . Palliative care encounter   . Small bowel obstruction due to adhesions ([place]) 12/29/2018  . Small bowel obstruction ([place]) 12/03/2018  . Partial small bowel obstruction ([place]) 11/01/2018  . SBO (small bowel obstruction) ([place]) 09/30/2018  . Iron deficiency anemia 07/03/2018  . 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 Nausea without vomiting 11/10/2017  . Incisional hernia 09/22/2017  . Abdominal pain of multiple sites 09/22/2017  . Goals of care, counseling/discussion 04/02/2017  . Prostate cancer metastatic to bone ([place]) 04/02/2017  . Elevated PSA 03/04/2017  . Osteopenia 11/03/2016  . Pelvic mass in male 09/04/2016  . 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 Exacerbation of Crohn's disease ([place]) 09/04/2016  . Family history of colon cancer 10/12/2014  . Protein-calorie malnutrition, severe ([place]) 07/27/2014  . Loss of weight 06/09/2014  . Crohn's disease of both small and large intestine with complication ([place]) 39/76/7341  . Boils 02/11/2013  . Ventral hernia 12/11/2009  . Anemia 12/03/2009  . Regional enteritis/Crohn's 01/25/2007    Past Surgical History:  Procedure Laterality Date  . APPENDECTOMY  1992  at time of exp laparotomy at which time he was noted to have fistulizing Crohn's rather than UC  . COLON SURGERY    . COLONOSCOPY [name] 08/23/2014   [id number] proctoscopy with possible fistulous opening in thebase of rectal/anal stump.    . COLOSTOMY  1992   transverse loop colostomy secondary to a stricture  . ESOPHAGOGASTRODUODENOSCOPY  05/2009   SLF: multiple antral  erosions, large ulcer at ansatomosis (postsurgical changes at duodenal bulb and second portion of duodenum) BX c/x NSAIDS.  . EUS  10/04/2009   Dr. Estill Bakes dilated CBD and main pancreatic duct.  No pancreatic  . EXPLORATORY LAPAROTOMY  1992  . FLEXIBLE SIGMOIDOSCOPY  1988   Dr. Laural Golden- suggested rohn's disease but the biopsies were not collaborative.  [name] FLEXIBLE SIGMOIDOSCOPY [name] 09/08/2016   Procedure: FLEXIBLE SIGMOIDOSCOPY;  Surgeon: Wonda Horner, MD;  Location: [facility] ENDOSCOPY;  Service: Gastroenterology;  Laterality: [name];  . HEMICOLOECTOMY W/ ANASTOMOSIS  1993   R- Dr.DeMason   . HERNIA REPAIR  1996   incarcerated periostial hernia with additional surgery in 1999  . IR FLUORO GUIDE PORT INSERTION RIGHT  04/03/2017  . IR US GUIDE VASC ACCESS RIGHT  04/03/2017       Home Medications    Prior to Admission medications   Medication Sig Start Date End Date Taking? Authorizing Provider  albuterol (PROVENTIL HFA) 108 (90 Base) MCG/ACT inhaler Inhale 2 puffs into the lungs every 6 (six) hours as needed for wheezing or shortness of breath.   Yes [provider]  budesonide (ENTOCORT EC) 3 MG 24 hr capsule TAKE 3 CAPSULES BY MOUTH EVERY DAY 06/27/19  Yes Mahala Menghini, PA-C  Calcium Carb-Cholecalciferol (CALCIUM 600-D PO) Take 2 tablets by mouth daily.   Yes [provider]  ferrous sulfate 325 (65 FE) MG tablet Take 325 mg by mouth daily with breakfast.   Yes [provider]  mirtazapine (REMERON) 15 MG tablet TAKE 1 TABLET BY MOUTH AT BEDTIME Patient taking differently: Take 15 mg by mouth at bedtime.  03/21/19  Yes Kent, [name], MD  Multiple Vitamins-Minerals (CENTRUM SILVER 50+MEN) TABS Take 1 tablet by mouth daily with breakfast.   Yes [provider]  oxyCODONE-acetaminophen (PERCOCET) 10-325 MG tablet Take 1 tablet by mouth every 8 (eight) hours as needed for pain. 06/23/19  Yes Troy, [name], MD  simethicone (MYLICON) 80 MG chewable tablet  Chew 80 mg by mouth 3 (three) times daily as needed (gas/flatulence).    Yes [provider]  XTANDI 40 MG capsule TAKE 4 CAPSULES (160 MG TOTAL) BY MOUTH DAILY. 06/14/19  Yes [name], MD  Adalimumab (HUMIRA PEN) 40 MG/0.8ML PNKT Inject 1 Syringe into the skin once a week. 06/12/19   Mahala Menghini, PA-C  dextromethorphan-guaiFENesin (MUCINEX DM) 30-600 MG 12hr tablet Take 1 tablet by mouth 2 (two) times daily. 06/09/19   Wieters, Hallie C, PA-C  lidocaine-prilocaine (EMLA) cream Apply 1 application topically daily as needed (when accessing port).     [provider]    Family History Family History  Problem Relation Age of Onset  . Cancer Father        prostate   . Prostate cancer Father   . Colon cancer Father 61  . Hypertension Sister   . Cancer Sister   . Depression Sister   . Breast cancer Sister   . COPD Sister   . Aneurysm Brother        deceased, brain aneurysm  Social History Social History   Tobacco Use  . Smoking status: Former Smoker    Packs/day: 0.75    Years: 32.00    Pack years: 24.00    Types: Cigarettes    Quit date: 12/21/2009    Years since quitting: 9.5  . Smokeless tobacco: Never Used  Substance Use Topics  . Alcohol use: No    Alcohol/week: 0.0 standard drinks    Comment: Former drinker  . Drug use: No     Allergies   Penicillins   Review of Systems Review of Systems Pertinent negatives listed in HPI  Physical Exam Triage Vital Signs ED Triage Vitals  Enc Vitals Group     BP 07/16/19 1249 128/81     Pulse Rate 07/16/19 1249 80     Resp 07/16/19 1249 (!) 22     Temp 07/16/19 1249 98.5 F (36.9 C)     Temp Source 07/16/19 1249 Temporal     SpO2 07/16/19 1249 100 %     Weight --      Height --      Head Circumference --      Peak Flow --      Pain Score 07/16/19 1250 0     Pain Loc --      Pain Edu? --      Excl. in [place]? --    No data found.  Updated Vital Signs BP 128/81   Pulse 80   Temp 98.5 F  (36.9 C) (Temporal)   Resp (!) 22   SpO2 100%   Visual Acuity Right Eye Distance:   Left Eye Distance:   Bilateral Distance:    Right Eye Near:   Left Eye Near:    Bilateral Near:     Physical Exam HENT:     Head: Normocephalic.     Nose: Nose normal.  Neck:     Musculoskeletal: Normal range of motion.  Cardiovascular:     Rate and Rhythm: Normal rate.  Pulmonary:     Effort: No respiratory distress.     Breath sounds: No decreased air movement. Rhonchi present. No decreased breath sounds.     Comments: Audible wet cough Musculoskeletal: Normal range of motion.  Skin:    General: Skin is warm.  Neurological:     Mental Status: He is alert and oriented to person, place, and time.  Psychiatric:        Mood and Affect: Mood normal.        Behavior: Behavior normal.    UC Treatments / Results  Labs (all labs ordered are listed, but only abnormal results are displayed) Labs Reviewed - No data to display  EKG   Radiology Dg Chest 2 View  Result Date: 07/16/2019 CLINICAL DATA:  63 year old male with cough and shortness of breath for 6 weeks. History of metastatic prostate cancer. EXAM: CHEST - 2 VIEW COMPARISON:  06/09/2019 chest radiograph and prior studies FINDINGS: The cardiomediastinal silhouette is unchanged. A RIGHT IJ Port-A-Cath is noted with tip overlying the LOWER SVC. New 2 cm RIGHT middle lobe nodular opacity is noted. There is no evidence of focal airspace disease, pulmonary edema, pleural effusion, or pneumothorax. No acute bony abnormalities are identified. IMPRESSION: New 2 cm RIGHT middle lobe nodular opacity which could represent a new nodule, focal atelectasis or focal airspace pneumonia. Consider radiographic follow-up to resolution or chest CT. Electronically Signed   By: Margarette Canada M.D.   On: 07/16/2019 13:36    Procedures Procedures (including  critical care time)  Medications Ordered in UC Medications - No data to display  Initial Impression /  Assessment and Plan / UC Course  I have reviewed the triage vital signs and the nursing notes.  Pertinent labs & imaging results that were available during my care of the patient were reviewed by me and considered in my medical decision making (see chart for details).    Patient with a history of metastatic cancer presents today with a complaint of persistent cough.  He was seen for the same issue about a month ago was treated empirically with antibiotic therapy, prednisone and cough suppressant.  He reports symptoms briefly improved however never completely resolved.  On exam today with chest x-ray a new 2 cm lung nodule is present in which pneumonia cannot be ruled out.  We will treat today with azithromycin for coverage of community-acquired pneumonia.  Patient instructed to follow-up with PCP as he will warrant further evaluation with CT scan to determine the underlying cause of the new right middle lobe 2 cm lung nodule.  He is stable without respiratory distress.  Will not recheck for COVID today as this is been a persistent ongoing symptomatic illness current for more than 30 days.  He was recently COVID tested which was negative about a month ago.  Red flags discussed.  Indication to return to ER for emergent evaluation discussed. Patient verbalized understanding and agreement with plan.  Final Clinical Impressions(s) / UC Diagnoses   Final diagnoses:  Pneumonia of right lung due to infectious organism, unspecified part of lung  Cough  Lung nodule, 2 cm right middle lobe     Discharge Instructions     Your chest x-ray shows today a new lung nodule for which the cause is unable to be identified with use of a chest x-ray.  You will need to follow-up with your primary care provider as she will need additional imaging such as a CT scan to further evaluate the cause of the new lung nodule.  I am treating you today for pneumonia given your symptoms of persistent cough for greater than a month now  the cough is productive.  The symptoms could be that of pneumonia.  I am starting you on antibiotic and a cough suppressant.  If symptoms worsen or you develop shortness of breath and/or wheezing please go immediately to the ER.    ED Prescriptions    Medication Sig Dispense Auth. Provider   azithromycin (ZITHROMAX) 250 MG tablet Take 2 tabs PO x 1 dose, then 1 tab PO QD x 4 days 6 tablet Scot Jun, FNP   benzonatate (TESSALON) 100 MG capsule Take 1-2 capsules (100-200 mg total) by mouth 3 (three) times daily as needed for cough. 60 capsule Scot Jun, FNP   albuterol (VENTOLIN HFA) 108 (90 Base) MCG/ACT inhaler Inhale 2 puffs into the lungs every 4 (four) hours as needed for wheezing or shortness of breath (cough, shortness of breath or wheezing.). 8 g Scot Jun, FNP     Controlled Substance Prescriptions [place] Controlled Substance Registry consulted? Not Applicable   Scot Jun, FNP 07/17/19 1008

## 2019-07-18 ENCOUNTER — Other Ambulatory Visit: Payer: Self-pay | Admitting: Family Medicine

## 2019-07-18 ENCOUNTER — Telehealth: Payer: Self-pay | Admitting: Adult Health Nurse Practitioner

## 2019-07-18 MED ORDER — OXYCODONE-ACETAMINOPHEN 10-325 MG PO TABS: 1.0000 | ORAL_TABLET | Freq: Three times a day (TID) | ORAL | 0 refills | Status: DC | PRN

## 2019-07-18 NOTE — Telephone Encounter (Signed)
Called patient to set up appointment.  Set up appointment in person tomorrow at [facility]. Olena Heckle NP

## 2019-07-18 NOTE — Telephone Encounter (Signed)
Ok to refill??  Last office visit 04/20/2019.  Last refill 06/23/2019.

## 2019-07-18 NOTE — Telephone Encounter (Signed)
Patient needs refill on oxycodone  [facility]

## 2019-07-19 ENCOUNTER — Other Ambulatory Visit: Payer: Self-pay

## 2019-07-19 ENCOUNTER — Other Ambulatory Visit: Payer: Medicare Other | Admitting: Adult Health Nurse Practitioner

## 2019-07-19 DIAGNOSIS — Z515 Encounter for palliative care: Secondary | ICD-10-CM

## 2019-07-19 NOTE — Progress Notes (Signed)
[facility] Palliative Care Consult Note Telephone: [phone]  Fax: [phone]  PATIENT NAME: Daniel Howard DOB: Feb 20, 1956 MRN: [id number]  PRIMARY CARE PROVIDER:   Alycia Rossetti, MD  REFERRING PROVIDER:  Alycia Rossetti, MD [address] [facility] [address] [facility] [place],  [place] [address]  RESPONSIBLE PARTY:   Self and wife, Daniel Howard: [phone] C: [phone]     RECOMMENDATIONS and PLAN:  1.  Cough.  Patient has been in hospital several times since last visit for cough.  He has been treated for pneumonia at last visit.  Wife states that it started with the flu a few months ago and the cough didn't go away.  Chest xray has shown COPD and a new RML 2 cm nodule.  He does have an appointment with his oncologist on Aug 13 and strongly urged him to keep this appointment for further evaluation of the RML nodule and the cough.  He has tried allergy medicines in case the cough was allergy related as he likes to work outside in the yard.  This did not improve the cough.  He has tried mucinex and tessalon pearls with no relief.  He has been prescribed inhalers with little relief.  He has tried nebulizer treatments with some relief.  Have offered that he could try prilosec in case it may possibly be related to GERD as he states it is worse when he lies down and in the morning.  Did discuss that if that didn't work than it is quite possibly related to his cancer and to discuss this with his oncologist and follow any recommendations.  2.  Pain.  Does state having pain which is relieved with his current Percocet 10/325 mg Q 8 hrs.  Continue current pain regimen.  3.  Appetite.  Patient continues to lose weight despite his wife saying that his appetite is really good and he eats well.  Also supplements with Ensure.  In February he was 137 pounds and today he weights 116.  He does  have follow up with GI on Sept 1.  Keep this appointment and follow any recommendations.    4.  Goals of care.  Patient with new cough in which several interventions have been tried with no relief.  Keep follow up with oncology for further evaluation.  Despite good appetite patient continues to lose weight.  Keep appointments with GI and oncology for further evaluation.  I spent 30 minutes providing this consultation,  from 1:00 to 1:30. More than 50% of the time in this consultation was spent coordinating communication.   HISTORY OF PRESENT ILLNESS:  Daniel Howard is a 63 y.o. year old male with multiple medical problems including prostate cancer, Crohn's. Palliative Care was asked to help address goals of care.   CODE STATUS: DNR  PPS: 60% HOSPICE ELIGIBILITY/DIAGNOSIS: TBD  PHYSICAL EXAM:   General: NAD, frail appearing, thin Extremities: no edema, no joint deformities Skin: no rashes Neurological: Weakness but otherwise nonfocal  PAST MEDICAL HISTORY:  Past Medical History:  Diagnosis Date  . Abnormal finding of biliary tract    MRCP shows pancreatic/biliary tract dilation. EUS 2010 confirmed dilation but no chronic pancreaitis or mass. Vascular ectasia crimpoing distal CBD.   [name] Anxiety   . 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 Duodenal ulcer 2010   nsaids  . History of blood transfusion 1992   "related to colon OR"  . Peristomal hernia   . Prostate cancer ([place]) 2018  . SBO (small bowel obstruction) ([place]) 11/10/2018  . Small bowel obstruction ([place]) 10/2017; 11/21/2017; 02/16/2018  . Spigelian hernia    bilateral    SOCIAL HX:   Social History   Tobacco Use  . Smoking status: Former Smoker    Packs/day: 0.75    Years: 32.00    Pack years: 24.00    Types: Cigarettes    Quit date: 12/21/2009    Years since quitting: 9.5  . Smokeless tobacco: Never Used  Substance Use Topics  . Alcohol use: No    Alcohol/week: 0.0 standard drinks    Comment: Former drinker    ALLERGIES:  Allergies  Allergen Reactions  . Penicillins Hives    Has patient had a PCN reaction causing immediate rash, facial/tongue/throat swelling, SOB or lightheadedness with hypotension: Yes Has patient had a PCN reaction causing severe rash involving mucus membranes or skin necrosis: No Has patient had a PCN reaction that required hospitalization: No Has patient had a PCN reaction occurring within the last 10 years: No If all of the above answers are "NO", then may proceed with Cephalosporin use.      PERTINENT MEDICATIONS:  Outpatient Encounter Medications as of 07/19/2019  Medication Sig  . Adalimumab (HUMIRA PEN) 40 MG/0.8ML PNKT Inject 1 Syringe into the skin once a week.  [name] albuterol (VENTOLIN HFA) 108 (90 Base) MCG/ACT inhaler Inhale 2 puffs into the lungs every 4 (four) hours as needed for wheezing or shortness of breath (cough, shortness of breath or wheezing.).  [name] azithromycin (ZITHROMAX) 250 MG tablet Take 2 tabs PO x 1 dose, then 1 tab PO QD x 4 days  . benzonatate (TESSALON) 100 MG capsule Take 1-2 capsules (100-200 mg total) by mouth 3 (three) times daily as needed for cough.  . budesonide (ENTOCORT EC) 3 MG 24 hr capsule TAKE 3 CAPSULES BY MOUTH EVERY DAY  . Calcium Carb-Cholecalciferol (CALCIUM 600-D PO) Take 2 tablets by mouth daily.  . ferrous sulfate 325 (65 FE) MG tablet Take 325 mg by mouth daily with breakfast.  . lidocaine-prilocaine (EMLA) cream Apply 1 application topically daily as needed (when accessing port).   . mirtazapine (REMERON) 15 MG tablet TAKE 1 TABLET BY MOUTH AT BEDTIME (Patient taking differently: Take 15  mg by mouth at bedtime. )  . Multiple Vitamins-Minerals (CENTRUM SILVER 50+MEN) TABS Take 1 tablet by mouth daily with breakfast.  . oxyCODONE-acetaminophen (PERCOCET) 10-325 MG tablet Take 1 tablet by mouth every 8 (eight) hours as needed for pain.  . simethicone (MYLICON) 80 MG chewable tablet Chew 80 mg by mouth 3 (three) times daily as needed (gas/flatulence).   [name] 40 MG capsule TAKE 4 CAPSULES (160 MG TOTAL) BY MOUTH DAILY.   Facility-Administered Encounter Medications as of 07/19/2019  Medication  . sodium chloride flush (NS) 0.9 % injection 10 mL      Renald Haithcock Jenetta Downer, NP

## 2019-07-21 ENCOUNTER — Other Ambulatory Visit: Payer: Self-pay | Admitting: Oncology

## 2019-07-21 DIAGNOSIS — C61 Malignant neoplasm of prostate: Secondary | ICD-10-CM | POA: Diagnosis not present

## 2019-07-21 DIAGNOSIS — C7951 Secondary malignant neoplasm of bone: Secondary | ICD-10-CM | POA: Diagnosis not present

## 2019-07-26 NOTE — Telephone Encounter (Signed)
I have not seen this patient since 2014. Unclear who requested labs. I also do not see labs in the box. Has this been taken care of?

## 2019-07-26 NOTE — Telephone Encounter (Signed)
Message was corrected by EG. It was entered in chart in error. Please disregard.

## 2019-07-27 MED FILL — XTANDI 40 MG CAPSULE: 40 | 30 days supply | Qty: 120 | Fill #0

## 2019-08-01 ENCOUNTER — Emergency Department (HOSPITAL_COMMUNITY): Payer: Medicare Other

## 2019-08-01 ENCOUNTER — Emergency Department (HOSPITAL_COMMUNITY)
Admission: EM | Admit: 2019-08-01 | Discharge: 2019-08-01 | Disposition: A | Discharge status: Home / Self Care | Payer: Medicare Other | Attending: Emergency Medicine | Admitting: Emergency Medicine

## 2019-08-01 ENCOUNTER — Encounter (HOSPITAL_COMMUNITY): Payer: Self-pay | Admitting: *Deleted

## 2019-08-01 ENCOUNTER — Other Ambulatory Visit: Payer: Self-pay

## 2019-08-01 DIAGNOSIS — C61 Malignant neoplasm of prostate: Secondary | ICD-10-CM | POA: Diagnosis not present

## 2019-08-01 DIAGNOSIS — Z932 Ileostomy status: Secondary | ICD-10-CM | POA: Diagnosis not present

## 2019-08-01 DIAGNOSIS — K50912 Crohn's disease, unspecified, with intestinal obstruction: Secondary | ICD-10-CM | POA: Diagnosis not present

## 2019-08-01 DIAGNOSIS — C7951 Secondary malignant neoplasm of bone: Secondary | ICD-10-CM | POA: Insufficient documentation

## 2019-08-01 DIAGNOSIS — R222 Localized swelling, mass and lump, trunk: Secondary | ICD-10-CM | POA: Diagnosis not present

## 2019-08-01 DIAGNOSIS — R1084 Generalized abdominal pain: Secondary | ICD-10-CM | POA: Diagnosis not present

## 2019-08-01 DIAGNOSIS — Z87891 Personal history of nicotine dependence: Secondary | ICD-10-CM | POA: Insufficient documentation

## 2019-08-01 DIAGNOSIS — K5900 Constipation, unspecified: Secondary | ICD-10-CM | POA: Insufficient documentation

## 2019-08-01 DIAGNOSIS — K5090 Crohn's disease, unspecified, without complications: Secondary | ICD-10-CM | POA: Diagnosis not present

## 2019-08-01 DIAGNOSIS — R590 Localized enlarged lymph nodes: Secondary | ICD-10-CM | POA: Diagnosis not present

## 2019-08-01 LAB — CBC WITH DIFFERENTIAL/PLATELET
Abs Immature Granulocytes: 0.03 10*3/uL (ref 0.00–0.07)
Basophils Absolute: 0.1 10*3/uL (ref 0.0–0.1)
Basophils Relative: 1 %
Eosinophils Absolute: 0.5 10*3/uL (ref 0.0–0.5)
Eosinophils Relative: 5 %
HCT: 36.3 % — ABNORMAL LOW (ref 39.0–52.0)
Hemoglobin: 11.2 g/dL — ABNORMAL LOW (ref 13.0–17.0)
Immature Granulocytes: 0 %
Lymphocytes Relative: 14 %
Lymphs Abs: 1.5 10*3/uL (ref 0.7–4.0)
MCH: 30.2 pg (ref 26.0–34.0)
MCHC: 30.9 g/dL (ref 30.0–36.0)
MCV: 97.8 fL (ref 80.0–100.0)
Monocytes Absolute: 0.8 10*3/uL (ref 0.1–1.0)
Monocytes Relative: 7 %
Neutro Abs: 8.3 10*3/uL — ABNORMAL HIGH (ref 1.7–7.7)
Neutrophils Relative %: 73 %
Platelets: 468 10*3/uL — ABNORMAL HIGH (ref 150–400)
RBC: 3.71 MIL/uL — ABNORMAL LOW (ref 4.22–5.81)
RDW: 13.7 % (ref 11.5–15.5)
WBC: 11.2 10*3/uL — ABNORMAL HIGH (ref 4.0–10.5)
nRBC: 0 % (ref 0.0–0.2)

## 2019-08-01 LAB — COMPREHENSIVE METABOLIC PANEL
ALT: 13 U/L (ref 0–44)
AST: 31 U/L (ref 15–41)
Albumin: 2.9 g/dL — ABNORMAL LOW (ref 3.5–5.0)
Alkaline Phosphatase: 73 U/L (ref 38–126)
Anion gap: 8 (ref 5–15)
BUN: 9 mg/dL (ref 8–23)
CO2: 25 mmol/L (ref 22–32)
Calcium: 8.7 mg/dL — ABNORMAL LOW (ref 8.9–10.3)
Chloride: 103 mmol/L (ref 98–111)
Creatinine, Ser: 1 mg/dL (ref 0.61–1.24)
GFR calc Af Amer: >60 mL/min (ref >60)
GFR calc non Af Amer: >60 mL/min (ref >60)
Glucose, Bld: 99 mg/dL (ref 70–99)
Potassium: 4.2 mmol/L (ref 3.5–5.1)
Sodium: 136 mmol/L (ref 135–145)
Total Bilirubin: 0.4 mg/dL (ref 0.3–1.2)
Total Protein: 8.2 g/dL — ABNORMAL HIGH (ref 6.5–8.1)

## 2019-08-01 LAB — URINALYSIS, ROUTINE W REFLEX MICROSCOPIC
Bilirubin Urine: NEGATIVE
Glucose, UA: NEGATIVE mg/dL
Hgb urine dipstick: NEGATIVE
Ketones, ur: NEGATIVE mg/dL
Leukocytes,Ua: NEGATIVE
Nitrite: NEGATIVE
Protein, ur: NEGATIVE mg/dL
Specific Gravity, Urine: 1.019 (ref 1.005–1.030)
pH: 6 (ref 5.0–8.0)

## 2019-08-01 LAB — LIPASE, BLOOD: Lipase: 22 U/L (ref 11–51)

## 2019-08-01 MED ORDER — IOHEXOL 300 MG/ML  SOLN
100.0000 mL | Freq: Once | INTRAMUSCULAR | Status: AC | PRN
  Administered 2019-08-01: 100 mL via INTRAVENOUS

## 2019-08-01 MED ORDER — POLYETHYLENE GLYCOL 3350 17 G PO PACK: 17.0000 g | PACK | Freq: Every day | ORAL | 0 refills | Status: AC

## 2019-08-01 MED ORDER — LACTATED RINGERS IV BOLUS
1000.0000 mL | Freq: Once | INTRAVENOUS | Status: AC
  Administered 2019-08-01: 1000 mL via INTRAVENOUS

## 2019-08-01 MED ORDER — MORPHINE SULFATE (PF) 4 MG/ML IV SOLN
4.0000 mg | Freq: Once | INTRAVENOUS | Status: AC
  Administered 2019-08-01: 4 mg via INTRAVENOUS
  Filled 2019-08-01: qty 1

## 2019-08-01 NOTE — ED Notes (Signed)
PO fluids given.

## 2019-08-01 NOTE — Consult Note (Signed)
[facility] Surgery Consult Note  Daniel Howard 03-Aug-1956  [id number].    Requesting MD: Lennice Sites Chief Complaint/Reason for Consult: SBO  HPI:  Daniel Howard is a 63yo male with a complicated PMH of Crohns, muliple prior abdominal surgeries, and metastatic prostate cancer at home on palliative care, who presented to [facility] complaining of abdominal pain and decreased ostomy output. He has a long-standing RLQ ileostomy and large left-sided incisional hernia at an old colostomy site that he declines referral to tertiary care center to discuss surgery for. He has been admitted for multiple prior SBOs that resolve without intervention. States that this episode started yesterday. He reports feeling bloated and having only one small hard stool yesterday. Denies n/v. He tolerated a regular diet yesterday but has had nothing to eat/drink today.  ED evaluation included CT scan which showed similar dilated ileum near right ileostomy, large left Spigelian hernia containing small and large bowel without obstruction. WBC 11.2. Cr 1.00. U/a negative.  General surgery asked to see. Patient now states that his abdominal pain is better. He denies bloating, nausea, or vomiting. Since presentation in the ED he now has a large amount of soft light brown stool in ostomy pouch.  ROS: Review of Systems  Constitutional: Positive for weight loss. Negative for chills and fever.  HENT: Negative.   Eyes: Negative.   Respiratory: Negative.   Cardiovascular: Negative.   Gastrointestinal: Positive for abdominal pain and constipation. Negative for diarrhea, nausea and vomiting.  Genitourinary: Negative.   Musculoskeletal: Positive for back pain.  Skin: Negative.   Neurological: Negative.    All systems reviewed and otherwise negative except for as above  Family History  Problem Relation Age of Onset  . Cancer Father        prostate   . Prostate cancer Father   . Colon cancer Father 13  .  Hypertension Sister   . Cancer Sister   . Depression Sister   . Breast cancer Sister   . COPD Sister   . Aneurysm Brother        deceased, brain aneurysm    Past Medical History:  Diagnosis Date  . Abnormal finding of biliary tract    MRCP shows pancreatic/biliary tract dilation. EUS 2010 confirmed dilation but no chronic pancreaitis or mass. Vascular ectasia crimpoing distal CBD.   [name] Anxiety   . 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 Duodenal ulcer 2010   nsaids  . History of blood transfusion 1992   "related to colon OR"  . Peristomal hernia   . Prostate cancer ([place]) 2018  . SBO (small bowel obstruction) ([place]) 11/10/2018  . Small bowel obstruction ([place]) 10/2017; 11/21/2017; 02/16/2018  . Spigelian hernia    bilateral    Past Surgical History:  Procedure Laterality Date  . APPENDECTOMY  1992   at time of exp laparotomy at which time he was noted to have fistulizing Crohn's rather than UC  . COLON SURGERY    . COLONOSCOPY [name] 08/23/2014   EHO:[id number] proctoscopy with possible fistulous opening in thebase of rectal/anal stump.    . COLOSTOMY  1992   transverse loop colostomy secondary to a stricture  . ESOPHAGOGASTRODUODENOSCOPY  05/2009  SLF: multiple antral erosions, large ulcer at ansatomosis (postsurgical changes at duodenal bulb and second portion of duodenum) BX c/x NSAIDS.  . EUS  10/04/2009   Dr. Estill Bakes dilated CBD and main pancreatic duct.  No pancreatic  . EXPLORATORY LAPAROTOMY  1992  . FLEXIBLE SIGMOIDOSCOPY  1988   Dr. Laural Golden- suggested rohn's disease but the biopsies were not collaborative.  [name] FLEXIBLE  SIGMOIDOSCOPY [name] 09/08/2016   Procedure: FLEXIBLE SIGMOIDOSCOPY;  Surgeon: Wonda Horner, MD;  Location: [facility] ENDOSCOPY;  Service: Gastroenterology;  Laterality: [name];  . HEMICOLOECTOMY W/ ANASTOMOSIS  1993   R- Dr.DeMason   . HERNIA REPAIR  1996   incarcerated periostial hernia with additional surgery in 1999  . IR FLUORO GUIDE PORT INSERTION RIGHT  04/03/2017  . IR US GUIDE VASC ACCESS RIGHT  04/03/2017    Social History:  reports that he quit smoking about 9 years ago. His smoking use included cigarettes. He has a 24.00 pack-year smoking history. He has never used smokeless tobacco. He reports that he does not drink alcohol or use drugs.  Allergies:  Allergies  Allergen Reactions  . Penicillins Hives    Has patient had a PCN reaction causing immediate rash, facial/tongue/throat swelling, SOB or lightheadedness with hypotension: Yes Has patient had a PCN reaction causing severe rash involving mucus membranes or skin necrosis: No Has patient had a PCN reaction that required hospitalization: No Has patient had a PCN reaction occurring within the last 10 years: No If all of the above answers are "NO", then may proceed with Cephalosporin use.     (Not in a hospital admission)   Prior to Admission medications   Medication Sig Start Date End Date Taking? Authorizing Provider  Adalimumab (HUMIRA PEN) 40 MG/0.8ML PNKT Inject 1 Syringe into the skin once a week. 06/12/19  Yes Mahala Menghini, PA-C  albuterol (VENTOLIN HFA) 108 (90 Base) MCG/ACT inhaler Inhale 2 puffs into the lungs every 4 (four) hours as needed for wheezing or shortness of breath (cough, shortness of breath or wheezing.). 07/16/19  Yes Scot Jun, FNP  benzonatate (TESSALON) 100 MG capsule Take 1-2 capsules (100-200 mg total) by mouth 3 (three) times daily as needed for cough. 07/16/19  Yes Scot Jun, FNP  budesonide (ENTOCORT EC) 3 MG 24 hr capsule TAKE 3 CAPSULES BY MOUTH EVERY DAY Patient taking differently:  Take 9 mg by mouth daily.  06/27/19  Yes Mahala Menghini, PA-C  Calcium Carb-Cholecalciferol (CALCIUM 600-D PO) Take 2 tablets by mouth daily.   Yes [provider]  ferrous sulfate 325 (65 FE) MG tablet Take 325 mg by mouth daily with breakfast.   Yes [provider]  lidocaine-prilocaine (EMLA) cream Apply 1 application topically daily as needed (when accessing port).    Yes [provider]  mirtazapine (REMERON) 15 MG tablet TAKE 1 TABLET BY MOUTH AT BEDTIME Patient taking differently: Take 15 mg by mouth at bedtime.  03/21/19  Yes Niles, [name], MD  Multiple Vitamins-Minerals (CENTRUM SILVER 50+MEN) TABS Take 1 tablet by mouth daily with breakfast.   Yes [provider]  oxyCODONE-acetaminophen (PERCOCET) 10-325 MG tablet Take 1 tablet by mouth every 8 (eight) hours as needed for pain. 07/18/19  Yes Munford, [name], MD  simethicone (MYLICON) 80 MG chewable tablet Chew 80 mg by mouth 3 (three) times daily as needed (gas/flatulence).    Yes [provider]  XTANDI 40 MG capsule TAKE 4 CAPSULES (160 MG TOTAL) BY MOUTH DAILY. Patient  taking differently: Take 160 mg by mouth daily.  07/21/19  Yes [name], MD  azithromycin (ZITHROMAX) 250 MG tablet Take 2 tabs PO x 1 dose, then 1 tab PO QD x 4 days Patient not taking: Reported on 08/01/2019 07/16/19   Scot Jun, FNP    Blood pressure 117/72, pulse 81, temperature 98.8 F (37.1 C), temperature source Oral, resp. rate 16, height 5' 4.5" (1.638 m), weight 54 kg, SpO2 96 %. Physical Exam: General: pleasant, frail AA male who is laying in bed in NAD HEENT: head is normocephalic, atraumatic.  Sclera are noninjected.  Pupils equal and round.  Ears and nose without any masses or lesions.  Mouth is pink and moist. Edentate  Heart: regular, rate, and rhythm.  No obvious murmurs, gallops, or rubs noted.  Palpable pedal pulses bilaterally Lungs: CTAB, no wheezes, rhonchi, or rales noted.   Respiratory effort nonlabored Abd: Soft, NT/ND, +BS,well healed midline incision, RLQ ostomy pink with air and soft light brown stool in pouch, large left ventral hernia partially reducible/ no overlying skin changes MS: calves soft and nontender without edema Skin: warm and dry with no masses, lesions, or rashes Psych: A&Ox3 with an appropriate affect. Neuro: cranial nerves grossly intact, extremity CSM intact bilaterally, normal speech  Results for orders placed or performed during the hospital encounter of 08/01/19 (from the past 48 hour(s))  Urinalysis, Routine w reflex microscopic     Status: None   Collection Time: 08/01/19  9:00 AM  Result Value Ref Range   Color, Urine YELLOW YELLOW   APPearance CLEAR CLEAR   Specific Gravity, Urine 1.019 1.005 - 1.030   pH 6.0 5.0 - 8.0   Glucose, UA NEGATIVE NEGATIVE mg/dL   Hgb urine dipstick NEGATIVE NEGATIVE   Bilirubin Urine NEGATIVE NEGATIVE   Ketones, ur NEGATIVE NEGATIVE mg/dL   Protein, ur NEGATIVE NEGATIVE mg/dL   Nitrite NEGATIVE NEGATIVE   Leukocytes,Ua NEGATIVE NEGATIVE    Comment: Performed at [facility] [address] [place], [place] [address]  CBC with Differential     Status: Abnormal   Collection Time: 08/01/19  9:37 AM  Result Value Ref Range   WBC 11.2 (H) 4.0 - 10.5 K/uL   RBC 3.71 (L) 4.22 - 5.81 MIL/uL   Hemoglobin 11.2 (L) 13.0 - 17.0 g/dL   HCT 36.3 (L) 39.0 - 52.0 %   MCV 97.8 80.0 - 100.0 fL   MCH 30.2 26.0 - 34.0 pg   MCHC 30.9 30.0 - 36.0 g/dL   RDW 13.7 11.5 - 15.5 %   Platelets 468 (H) 150 - 400 K/uL   nRBC 0.0 0.0 - 0.2 %   Neutrophils Relative % 73 %   Neutro Abs 8.3 (H) 1.7 - 7.7 K/uL   Lymphocytes Relative 14 %   Lymphs Abs 1.5 0.7 - 4.0 K/uL   Monocytes Relative 7 %   Monocytes Absolute 0.8 0.1 - 1.0 K/uL   Eosinophils Relative 5 %   Eosinophils Absolute 0.5 0.0 - 0.5 K/uL   Basophils Relative 1 %   Basophils Absolute 0.1 0.0 - 0.1 K/uL   Immature Granulocytes 0 %   Abs Immature  Granulocytes 0.03 0.00 - 0.07 K/uL    Comment: Performed at [facility], [place] [address], [place], [place] [address]  Comprehensive metabolic panel     Status: Abnormal   Collection Time: 08/01/19  9:37 AM  Result Value Ref Range   Sodium 136 135 - 145 mmol/L  Potassium 4.2 3.5 - 5.1 mmol/L   Chloride 103 98 - 111 mmol/L   CO2 25 22 - 32 mmol/L   Glucose, Bld 99 70 - 99 mg/dL   BUN 9 8 - 23 mg/dL   Creatinine, Ser 1.00 0.61 - 1.24 mg/dL   Calcium 8.7 (L) 8.9 - 10.3 mg/dL   Total Protein 8.2 (H) 6.5 - 8.1 g/dL   Albumin 2.9 (L) 3.5 - 5.0 g/dL   AST 31 15 - 41 U/L   ALT 13 0 - 44 U/L   Alkaline Phosphatase 73 38 - 126 U/L   Total Bilirubin 0.4 0.3 - 1.2 mg/dL   GFR calc non Af Amer >60 >60 mL/min   GFR calc Af Amer >60 >60 mL/min   Anion gap 8 5 - 15    Comment: Performed at [facility], [address] [place], [place] [address]  Lipase, blood     Status: None   Collection Time: 08/01/19  9:37 AM  Result Value Ref Range   Lipase 22 11 - 51 U/L    Comment: Performed at [facility] [address], [place], [place] [address]   Ct Abdomen Pelvis W Contrast  Addendum Date: 08/01/2019   ADDENDUM REPORT: 08/01/2019 13:48 ADDENDUM: The original report was by Dr. Van Clines. The following addendum is by Dr. Van Clines: Please note that although I had originally thought that the featureless dilated bowel extending to the right ostomyright side was colon, I later recognized it as ileum, and accordingly impression # 1 should read: 1. Roughly similar appearance of dilated ILEUM extending towards the presumed right ILEOSTOMY, I tend to favor some type of adhesion in the vicinity of image 47/3 as a likely cause for the mildly dilated loops in this vicinity. This change was called by telephone at the time of addendum on 08/01/2019 at 1:48 pm to Dr. Tillie Fantasia , who verbally acknowledged these results. Electronically Signed   By: Van Clines M.D.   On:  08/01/2019 13:48   Result Date: 08/01/2019 CLINICAL DATA:  Abdominal distention. Remote history of abdominal surgeries. EXAM: CT ABDOMEN AND PELVIS WITH CONTRAST TECHNIQUE: Multidetector CT imaging of the abdomen and pelvis was performed using the standard protocol following bolus administration of intravenous contrast. CONTRAST:  114m OMNIPAQUE IOHEXOL 300 MG/ML  SOLN COMPARISON:  05/29/2019 FINDINGS: Lower chest: Volume loss and bronchiectasis medially in the right middle lobe with associated scarring. There is likely some scarring anteriorly in the left lower lobe as well, somewhat blurred by motion artifact. Hepatobiliary: Gallbladder unremarkable. No significant hepatic abnormality. Pancreas: The dorsal pancreatic duct is 4 mm in diameter, mildly dilated, extending to an indistinct hypodensity in the vicinity of the ampulla for example on images 24-25 of series 3. This is roughly similar back on prior exams over the past year. Spleen: Unremarkable Adrenals/Urinary Tract: Adrenal glands unremarkable. Nonspecific 6 mm hypodense lesion of the left mid kidney anteriorly on image 27/3, stable. Dependent stones in the left posterior urinary bladder. Stomach/Bowel: Abnormal soft tissue density along the right side of the rectum tracking along the pelvic sidewall and towards the sciatic notch and presumably involving the prostate gland and right seminal vesicle, measuring about 3.5 cm in thickness and previously about the same. This may well be invading or involving the rectum/rectal pouch. A left-sided Spigelian hernia involves multiple loops of small bowel as well as left-sided large bowel. A right-sided ileostomy is present with frothy fluid in dilated loops of bowel  in the right abdomen and extending down in the pelvis. A pelvic loop of dilated likely small-bowel measures 3.7 cm in diameter and overall the configuration is very similar to prior. Adhesion seem likely given the somewhat tethered appearance of  small bowel anteriorly and the angulation between one of the dilated loops which appears to extend to the ostomy for example around image 47/3. There is a paucity of intra-adipose tissue, as well as a haziness to the mesentery, making separation of bowel loops from possible mesenteric nodal processes difficult. Vascular/Lymphatic: Progressive pathologic periaortic adenopathy. A left periaortic lymph node measures 2.6 cm in short axis diameter on image 24/3, previously 2.4 cm. A retroaortic node measures 1.9 cm in short axis on image 28/3, previously 1.4 cm. Adenopathy extends around the left renal artery, somewhat encasing the artery on image 22/3. Reproductive: As noted above, there is a mass involving the right pelvic sidewall and extending towards the right seminal vesicle and prostate gland as well as the right-side of the rectum, roughly similar to previous. Local prostate cancer invasion is certainly a diagnostic possibility. Other: Low-grade mesenteric edema. Trace fluid in the right paracolic gutter. Musculoskeletal: Sclerotic metastatic lesions involving the right proximal femoral shaft, right ischium, left inferior pubic ramus, bilateral posterior acetabular walls, and posterior iliac bones noted. Fused sacroiliac joints. IMPRESSION: 1. Roughly similar appearance of dilated colon extending towards the presumed right colostomy loop, I tend to favor some type of adhesion in the vicinity of image 47/3 as a likely cause for the mildly dilated loops in this vicinity. 2. Large left Spigelian hernia containing small and large bowel. 3. Worsening periaortic adenopathy, dominant left periaortic lymph node currently 2.6 cm in diameter. 4. Similar appearance of mass involving the right side of the prostate gland, right seminal vesicle, right pelvic sidewall, and right anterior rectum. 5. 2 similar appearance and pattern of sclerotic metastatic disease of the pelvis and right proximal femur. 6. Localized volume loss  and bronchiectasis medially in the right middle lobe. 7. Chronic mild dilatation of the dorsal pancreatic duct. Stable indistinct hypodensity in the vicinity of the ampulla, nonspecific. 8. Small dependent stones in the left posterior urinary bladder. 9. Paucity of intra-adipose tissue along with some mesenteric edema and trace fluid in the right paracolic gutter. The appearance makes it difficult to separate out loops of bowel from potential mesenteric lymph nodes. 10. Fused bilateral sacroiliac joints. Electronically Signed: By: Van Clines M.D. On: 08/01/2019 13:03      Assessment/Plan Crohn's disease on immunosuppression Parastomal hernia Metastatic prostate cancer- on palliative treatmentXtandi Ostomy in place Moderate protein calorie malnutrition Chronic pain   Abdominal pain Constipation Possible partial SBO, resolving -Patient came to the ED with 1 day of abdominal pain, bloating, and decreased ostomy output. Since presenting to the ED his pain has improved, bloating resolved, and he now has a large amount of soft light brown stool from ostomy. Abdominal exam benign.  Recommend PO challenge with liquids. If patient is able to tolerate diet and continues to have ostomy output he may be discharged home. If any concern could admit for observation. Recommend miralax daily and as needed for constipation. Recommend continuing full liquid diet at home.   Wellington Hampshire, [facility] Surgery 08/01/2019, 2:20 PM Pager: [phone] Mon-Thurs 7:00 am-4:30 pm Fri 7:00 am -11:30 AM Sat-Sun 7:00 am-11:30 am

## 2019-08-01 NOTE — ED Notes (Signed)
Pt back from CT

## 2019-08-01 NOTE — ED Notes (Signed)
Pt tasking po fgluids and tolerates well.

## 2019-08-01 NOTE — ED Triage Notes (Signed)
Pt feels he has a bowel obstruction.  States hard bm this am.  Denies emesis.

## 2019-08-01 NOTE — ED Provider Notes (Signed)
I have personally seen and examined the patient. I have reviewed the documentation on PMH/FH/Soc Hx. I have discussed the plan of care with the resident and patient.  I have reviewed and agree with the resident's documentation. Please see associated encounter note  Briefly, the patient is a 63 y.o. male here with abdominal pain.  Patient with history of small bowel obstruction, status post ostomy.  History of Crohn's.  Has diffuse abdominal pain.  States that his stool has been more hard, out of his ostomy.  He has what appears to be loose stool coming from his ostomy site.  Has not had any nausea or vomiting.  However he has diffuse tenderness on exam.  Denies any urinary symptoms.  Will evaluate for intra-abdominal process such as infectious process or obstruction with CT scan to get basic labs.  He is not on any stool softeners.  Will reevaluate.  Lab work overall reassuring.  No significant anemia, electrolyte abnormality.  CT scan shows some mildly dilated loops of bowel although possibly this is chronic in appearance.  There appears to likely be no new acute findings on CT scan.   We discussed these findings with general surgery and they came and evaluated the patient.  Patient overall clinically appears well.  Does not appear to be in any signs of acute obstruction clinically and CT scan likely chronic findings.  Recommend MiraLAX, liquid diet and strict return precautions.  Discharged from the ED in good condition. Patient tolerated PO, pain improved, would to be discharged. Understands return precautions.     EKG Interpretation None         Lennice Sites, DO 08/01/19 1434

## 2019-08-01 NOTE — ED Provider Notes (Signed)
[facility] EMERGENCY DEPARTMENT Provider Note   CSN: [id number] Arrival date & time: 08/01/19  9390    History   Chief Complaint Chief Complaint  Patient presents with   Constipation    hx of obstruction    HPI Daniel Howard is a 63 y.o. male.     HPI *Original triage note by Hassan Rowan is for a different patient.  Patient presents for evaluation of abdominal pain and constipation.  He states that he has normal soft bowel movements daily through his ostomy.  2 days ago stools became firm/hard, which is unusual for him.  He has not had a bowel movement today, and is not sure that he had one yesterday.  Is also had onset of sharp, achy abdominal pain in the right lower quadrant that is constant.  States that this feels exactly like previous obstructions.  No other constitutional symptoms of illness.  Past Medical History:  Diagnosis Date   Abnormal finding of biliary tract    MRCP shows pancreatic/biliary tract dilation. EUS 2010 confirmed dilation but no chronic pancreaitis or mass. Vascular ectasia crimpoing distal CBD.    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.s/p transverse loop colostomy secondary to stricture 1992., followed by end-transverse ostomy, followed by right hemicolectomy, followed  by takedown & ileostomy   Duodenal ulcer 2010   nsaids   History of blood transfusion 1992   "related to colon OR"   Peristomal hernia    Prostate cancer ([place]) 2018   SBO (small bowel obstruction) ([place]) 11/10/2018   Small bowel obstruction ([place]) 10/2017; 11/21/2017; 02/16/2018   Spigelian hernia    bilateral    Patient Active Problem List   Diagnosis Date Noted   Intra-abdominal abscess ([place])    Incarcerated hernia 01/09/2019   Palliative care encounter      Small bowel obstruction due to adhesions ([place]) 12/29/2018   Small bowel obstruction ([place]) 12/03/2018   Partial small bowel obstruction ([place]) 11/01/2018   SBO (small bowel obstruction) (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   Exacerbation of Crohn's disease ([place]) 09/04/2016   Family history of colon cancer 10/12/2014   Protein-calorie malnutrition, severe ([place]) 07/27/2014   Loss of weight 06/09/2014   Crohn's disease of both small and large intestine with complication ([place]) 30/08/2329   Boils 02/11/2013   Ventral hernia 12/11/2009   Anemia 12/03/2009   Regional enteritis/Crohn's 01/25/2007    Past Surgical History:  Procedure Laterality Date   APPENDECTOMY  1992   at time of exp laparotomy at which time he was noted to have fistulizing Crohn's rather than UC   COLON SURGERY     COLONOSCOPY [name] 08/23/2014   QTM:AUQJFHL proctoscopy with possible fistulous opening in thebase of rectal/anal stump.     COLOSTOMY  1992   transverse loop colostomy secondary to a stricture   ESOPHAGOGASTRODUODENOSCOPY  05/2009   SLF: multiple antral erosions, large ulcer at ansatomosis (postsurgical changes at  duodenal bulb and second portion of duodenum) BX c/x NSAIDS.   EUS  10/04/2009   Dr. Estill Bakes dilated CBD and main pancreatic duct.  No pancreatic   EXPLORATORY LAPAROTOMY  1992   FLEXIBLE SIGMOIDOSCOPY  1988   Dr. Laural Golden- suggested rohn's disease but the biopsies were not collaborative.   FLEXIBLE SIGMOIDOSCOPY [name] 09/08/2016   Procedure: FLEXIBLE SIGMOIDOSCOPY;  Surgeon: Wonda Horner, MD;  Location: [facility] ENDOSCOPY;  Service: Gastroenterology;  Laterality: [name];   [place]   incarcerated periostial hernia with additional surgery in [place] RIGHT  04/03/2017   IR US GUIDE VASC ACCESS RIGHT  04/03/2017        Home Medications    Prior to Admission medications   Medication Sig Start Date End Date Taking? Authorizing Provider  Adalimumab (HUMIRA PEN) 40 MG/0.8ML PNKT Inject 1 Syringe into the skin once a week. 06/12/19  Yes Mahala Menghini, PA-C  albuterol (VENTOLIN HFA) 108 (90 Base) MCG/ACT inhaler Inhale 2 puffs into the lungs every 4 (four) hours as needed for wheezing or shortness of breath (cough, shortness of breath or wheezing.). 07/16/19  Yes Scot Jun, FNP  benzonatate (TESSALON) 100 MG capsule Take 1-2 capsules (100-200 mg total) by mouth 3 (three) times daily as needed for cough. 07/16/19  Yes Scot Jun, FNP  budesonide (ENTOCORT EC) 3 MG 24 hr capsule TAKE 3 CAPSULES BY MOUTH EVERY DAY Patient taking differently: Take 9 mg by mouth daily.  06/27/19  Yes Mahala Menghini, PA-C  Calcium Carb-Cholecalciferol (CALCIUM 600-D PO) Take 2 tablets by mouth daily.   Yes [provider]  ferrous sulfate 325 (65 FE) MG tablet Take 325 mg by mouth daily with breakfast.   Yes [provider]  lidocaine-prilocaine (EMLA) cream Apply 1 application topically daily as needed (when accessing port).    Yes [provider]  mirtazapine (REMERON) 15 MG tablet TAKE 1 TABLET BY MOUTH AT BEDTIME Patient taking differently: Take 15 mg by mouth at bedtime.  03/21/19  Yes [place], [name], MD  Multiple Vitamins-Minerals (CENTRUM SILVER 50+MEN) TABS Take 1 tablet by mouth daily with breakfast.   Yes [provider]  oxyCODONE-acetaminophen (PERCOCET) 10-325 MG tablet Take 1 tablet by mouth every 8 (eight) hours as needed for pain. 07/18/19  Yes  [place], [name], MD  simethicone (MYLICON) 80 MG chewable tablet Chew 80 mg by mouth 3 (three) times daily as needed (gas/flatulence).    Yes [provider]  XTANDI 40 MG capsule TAKE 4 CAPSULES (160 MG TOTAL) BY MOUTH DAILY. Patient taking differently: Take 160 mg by mouth daily.  07/21/19  Yes [name], MD  azithromycin (ZITHROMAX) 250 MG tablet Take 2 tabs PO x 1 dose, then 1 tab PO QD x 4 days Patient not taking: Reported on 08/01/2019 07/16/19   Scot Jun, FNP  polyethylene glycol (MIRALAX) 17 g packet Take 17 g by mouth daily. You can take 17 g, or as much as needed daily to continue having soft daily stools. 08/01/19   Tillie Fantasia, MD    Family History Family History  Problem Relation Age of Onset   Cancer Father        prostate    Prostate cancer Father    Colon cancer Father 48   Hypertension Sister    Cancer Sister    Depression Sister  Breast cancer Sister    COPD Sister    Aneurysm Brother        deceased, brain aneurysm    Social History Social History   Tobacco Use   Smoking status: Former Smoker    Packs/day: 0.75    Years: 32.00    Pack years: 24.00    Types: Cigarettes    Quit date: 12/21/2009    Years since quitting: 9.6   Smokeless tobacco: Never Used  Substance Use Topics   Alcohol use: No    Alcohol/week: 0.0 standard drinks    Comment: Former drinker   Drug use: No     Allergies   Penicillins   Review of Systems Review of Systems  Constitutional: Negative for chills and fever.  HENT: Positive for sore throat. Negative for ear pain.        Reports chronic sore throat due to cancer  Eyes: Negative for visual disturbance.  Respiratory: Negative for cough and shortness of breath.   Cardiovascular: Negative for chest pain and palpitations.  Gastrointestinal: Positive for abdominal pain. Negative for vomiting.  Genitourinary: Negative for dysuria and hematuria.  Musculoskeletal: Negative for  arthralgias and back pain.  Skin: Negative for color change and rash.  Neurological: Negative for seizures and syncope.  Psychiatric/Behavioral: Negative for dysphoric mood.  All other systems reviewed and are negative.    Physical Exam Updated Vital Signs BP 125/79    Pulse 80    Temp 98.8 F (37.1 C) (Oral)    Resp 18    Ht 5' 4.5" (1.638 m)    Wt 54 kg    SpO2 98%    BMI 20.11 kg/m   Physical Exam Vitals signs and nursing note reviewed.  Constitutional:      Appearance: He is well-developed.     Comments: Thin bordering on cachectic, appears chronically unwell  HENT:     Head: Normocephalic and atraumatic.     Mouth/Throat:     Mouth: Mucous membranes are dry.  Eyes:     Conjunctiva/sclera: Conjunctivae normal.  Neck:     Musculoskeletal: Neck supple.  Cardiovascular:     Rate and Rhythm: Normal rate and regular rhythm.     Heart sounds: No murmur.  Pulmonary:     Effort: Pulmonary effort is normal. No respiratory distress.     Breath sounds: Normal breath sounds.  Abdominal:     Tenderness: There is abdominal tenderness.     Comments: Multiple scars consistent with surgical history.  Right ostomy in place with minimal gas and one hard stool.  Tympany in the right upper quadrant.  Tenderness to palpation in the right lower quadrant with guarding.  Skin:    General: Skin is warm and dry.  Neurological:     Mental Status: He is alert and oriented to person, place, and time.  Psychiatric:        Mood and Affect: Mood normal.      ED Treatments / Results  Labs (all labs ordered are listed, but only abnormal results are displayed) Labs Reviewed  CBC WITH DIFFERENTIAL/PLATELET - Abnormal; Notable for the following components:      Result Value   WBC 11.2 (*)    RBC 3.71 (*)    Hemoglobin 11.2 (*)    HCT 36.3 (*)    Platelets 468 (*)    Neutro Abs 8.3 (*)    All other components within normal limits  COMPREHENSIVE METABOLIC PANEL - Abnormal; Notable for the  following components:  Calcium 8.7 (*)    Total Protein 8.2 (*)    Albumin 2.9 (*)    All other components within normal limits  LIPASE, BLOOD  URINALYSIS, ROUTINE W REFLEX MICROSCOPIC    EKG None  Radiology Ct Abdomen Pelvis W Contrast  Addendum Date: 08/01/2019   ADDENDUM REPORT: 08/01/2019 13:48 ADDENDUM: The original report was by Dr. Van Clines. The following addendum is by Dr. Van Clines: Please note that although I had originally thought that the featureless dilated bowel extending to the right ostomyright side was colon, I later recognized it as ileum, and accordingly impression # 1 should read: 1. Roughly similar appearance of dilated ILEUM extending towards the presumed right ILEOSTOMY, I tend to favor some type of adhesion in the vicinity of image 47/3 as a likely cause for the mildly dilated loops in this vicinity. This change was called by telephone at the time of addendum on 08/01/2019 at 1:48 pm to Dr. Tillie Fantasia , who verbally acknowledged these results. Electronically Signed   By: Van Clines M.D.   On: 08/01/2019 13:48   Result Date: 08/01/2019 CLINICAL DATA:  Abdominal distention. Remote history of abdominal surgeries. EXAM: CT ABDOMEN AND PELVIS WITH CONTRAST TECHNIQUE: Multidetector CT imaging of the abdomen and pelvis was performed using the standard protocol following bolus administration of intravenous contrast. CONTRAST:  150m OMNIPAQUE IOHEXOL 300 MG/ML  SOLN COMPARISON:  05/29/2019 FINDINGS: Lower chest: Volume loss and bronchiectasis medially in the right middle lobe with associated scarring. There is likely some scarring anteriorly in the left lower lobe as well, somewhat blurred by motion artifact. Hepatobiliary: Gallbladder unremarkable. No significant hepatic abnormality. Pancreas: The dorsal pancreatic duct is 4 mm in diameter, mildly dilated, extending to an indistinct hypodensity in the vicinity of the ampulla for example on images 24-25  of series 3. This is roughly similar back on prior exams over the past year. Spleen: Unremarkable Adrenals/Urinary Tract: Adrenal glands unremarkable. Nonspecific 6 mm hypodense lesion of the left mid kidney anteriorly on image 27/3, stable. Dependent stones in the left posterior urinary bladder. Stomach/Bowel: Abnormal soft tissue density along the right side of the rectum tracking along the pelvic sidewall and towards the sciatic notch and presumably involving the prostate gland and right seminal vesicle, measuring about 3.5 cm in thickness and previously about the same. This may well be invading or involving the rectum/rectal pouch. A left-sided Spigelian hernia involves multiple loops of small bowel as well as left-sided large bowel. A right-sided ileostomy is present with frothy fluid in dilated loops of bowel in the right abdomen and extending down in the pelvis. A pelvic loop of dilated likely small-bowel measures 3.7 cm in diameter and overall the configuration is very similar to prior. Adhesion seem likely given the somewhat tethered appearance of small bowel anteriorly and the angulation between one of the dilated loops which appears to extend to the ostomy for example around image 47/3. There is a paucity of intra-adipose tissue, as well as a haziness to the mesentery, making separation of bowel loops from possible mesenteric nodal processes difficult. Vascular/Lymphatic: Progressive pathologic periaortic adenopathy. A left periaortic lymph node measures 2.6 cm in short axis diameter on image 24/3, previously 2.4 cm. A retroaortic node measures 1.9 cm in short axis on image 28/3, previously 1.4 cm. Adenopathy extends around the left renal artery, somewhat encasing the artery on image 22/3. Reproductive: As noted above, there is a mass involving the right pelvic sidewall and extending towards the right seminal vesicle  and prostate gland as well as the right-side of the rectum, roughly similar to previous.  Local prostate cancer invasion is certainly a diagnostic possibility. Other: Low-grade mesenteric edema. Trace fluid in the right paracolic gutter. Musculoskeletal: Sclerotic metastatic lesions involving the right proximal femoral shaft, right ischium, left inferior pubic ramus, bilateral posterior acetabular walls, and posterior iliac bones noted. Fused sacroiliac joints. IMPRESSION: 1. Roughly similar appearance of dilated colon extending towards the presumed right colostomy loop, I tend to favor some type of adhesion in the vicinity of image 47/3 as a likely cause for the mildly dilated loops in this vicinity. 2. Large left Spigelian hernia containing small and large bowel. 3. Worsening periaortic adenopathy, dominant left periaortic lymph node currently 2.6 cm in diameter. 4. Similar appearance of mass involving the right side of the prostate gland, right seminal vesicle, right pelvic sidewall, and right anterior rectum. 5. 2 similar appearance and pattern of sclerotic metastatic disease of the pelvis and right proximal femur. 6. Localized volume loss and bronchiectasis medially in the right middle lobe. 7. Chronic mild dilatation of the dorsal pancreatic duct. Stable indistinct hypodensity in the vicinity of the ampulla, nonspecific. 8. Small dependent stones in the left posterior urinary bladder. 9. Paucity of intra-adipose tissue along with some mesenteric edema and trace fluid in the right paracolic gutter. The appearance makes it difficult to separate out loops of bowel from potential mesenteric lymph nodes. 10. Fused bilateral sacroiliac joints. Electronically Signed: By: Van Clines M.D. On: 08/01/2019 13:03    Procedures Procedures (including critical care time)  Medications Ordered in ED Medications  morphine 4 MG/ML injection 4 mg (4 mg Intravenous Given 08/01/19 0958)  lactated ringers bolus 1,000 mL (0 mLs Intravenous Stopped 08/01/19 1145)  iohexol (OMNIPAQUE) 300 MG/ML solution 100  mL (100 mLs Intravenous Contrast Given 08/01/19 1153)     Initial Impression / Assessment and Plan / ED Course  I have reviewed the triage vital signs and the nursing notes.  Pertinent labs & imaging results that were available during my care of the patient were reviewed by me and considered in my medical decision making (see chart for details).        Mr. Asfaw is a 63 year old male with a history of Crohn's disease with multiple abdominal surgeries including old colostomy now with right lower quadrant ileostomy and metastatic prostate cancer.  I reviewed his medical record.  Concern on arrival was for obstruction as above.  Initially he had tympany and tenderness on abdominal exam.  Went for CT.  On my reevaluation both of these had improved and he was comfortable.  He had some ostomy output of stool and gas while awaiting results.  While waiting he also had return of labs which showed no significant electrolyte abnormality, mild leukocytosis at 11.2, and stable normocytic anemia with a hemoglobin of 11.2.  Nothing to suggest infection on UA.  Ultimately CT showed multiple findings including large left spigelian hernia without signs of incarceration, worsening periaortic adenopathy, redemonstrated right-sided prostate mass, and other findings associated with his known oncologic disease.  In regards to chief complaint there is potential partial obstruction due to adhesion but no clear closed-loop small bowel obstruction.  Given the patient's complex history and indeterminate radiographic findings, consulted general surgery service who evaluated the patient at bedside and recommended p.o. challenge and if the patient continued to feel well, would be appropriate for discharge.  They did note that upon their last evaluation they encourage the patient to transition  to a full liquid diet given his history of frequent obstructions but the patient has declined thus far.  Also recommended MiraLAX for stool  burden noted on imaging.  Patient was p.o. challenged with liquids and tolerated well.  Still comfortable on reassessment.  No indication for further emergent evaluation or admission.  Okay for discharge.  Provided prescription and instructions for MiraLAX as well as instructions to transition to liquid diet as much as possible.  Will follow up with PCP.  Patient vocalized understanding and agreement with plan. Hand no other questions or concerns. Was given relevant verbal and written information regarding aftercare and return precautions and discharged in good condition.    Final Clinical Impressions(s) / ED Diagnoses   Final diagnoses:  Constipation, unspecified constipation type  Crohn's disease with intestinal obstruction, unspecified gastrointestinal tract location ([place])  Ileostomy in place [facility])  Malignant neoplasm of prostate metastatic to bone [facility])    ED Discharge Orders         Ordered    polyethylene glycol (MIRALAX) 17 g packet  Daily     08/01/19 1422           Tillie Fantasia, MD 08/01/19 [place], [place], DO 08/01/19 1640

## 2019-08-01 NOTE — ED Notes (Signed)
Patient transported to CT 

## 2019-08-01 NOTE — ED Notes (Signed)
Discharge instructions discussed with pt and he verbalizes understanding. Home stable with son.

## 2019-08-01 NOTE — Discharge Instructions (Addendum)
It would be safest if you switch to a full liquid diet, as this would help prevent further obstructions.  You can also take MiraLAX as prescribed to soften your stools for easier transit.

## 2019-08-01 NOTE — ED Notes (Signed)
MD at bedside. 

## 2019-08-04 ENCOUNTER — Other Ambulatory Visit: Payer: Self-pay

## 2019-08-04 ENCOUNTER — Inpatient Hospital Stay: Payer: Medicare Other | Attending: Oncology

## 2019-08-04 ENCOUNTER — Inpatient Hospital Stay (HOSPITAL_BASED_OUTPATIENT_CLINIC_OR_DEPARTMENT_OTHER): Payer: Medicare Other | Admitting: Oncology

## 2019-08-04 ENCOUNTER — Inpatient Hospital Stay: Payer: Medicare Other

## 2019-08-04 VITALS — BP 128/71 | HR 82 | Temp 99.1°F | Resp 17 | Ht 64.5 in | Wt 118.2 lb

## 2019-08-04 DIAGNOSIS — R591 Generalized enlarged lymph nodes: Secondary | ICD-10-CM | POA: Insufficient documentation

## 2019-08-04 DIAGNOSIS — C61 Malignant neoplasm of prostate: Secondary | ICD-10-CM

## 2019-08-04 DIAGNOSIS — R609 Edema, unspecified: Secondary | ICD-10-CM | POA: Insufficient documentation

## 2019-08-04 DIAGNOSIS — C7951 Secondary malignant neoplasm of bone: Secondary | ICD-10-CM

## 2019-08-04 DIAGNOSIS — R109 Unspecified abdominal pain: Secondary | ICD-10-CM | POA: Insufficient documentation

## 2019-08-04 DIAGNOSIS — Z880 Allergy status to penicillin: Secondary | ICD-10-CM | POA: Insufficient documentation

## 2019-08-04 DIAGNOSIS — Z79899 Other long term (current) drug therapy: Secondary | ICD-10-CM | POA: Diagnosis not present

## 2019-08-04 DIAGNOSIS — J479 Bronchiectasis, uncomplicated: Secondary | ICD-10-CM | POA: Insufficient documentation

## 2019-08-04 LAB — CMP (CANCER CENTER ONLY)
ALT: 10 U/L (ref 0–44)
AST: 24 U/L (ref 15–41)
Albumin: 2.6 g/dL — ABNORMAL LOW (ref 3.5–5.0)
Alkaline Phosphatase: 75 U/L (ref 38–126)
Anion gap: 11 (ref 5–15)
BUN: 8 mg/dL (ref 8–23)
CO2: 24 mmol/L (ref 22–32)
Calcium: 8.4 mg/dL — ABNORMAL LOW (ref 8.9–10.3)
Chloride: 103 mmol/L (ref 98–111)
Creatinine: 1.11 mg/dL (ref 0.61–1.24)
GFR, Est AFR Am: >60 mL/min (ref >60)
GFR, Estimated: >60 mL/min (ref >60)
Glucose, Bld: 112 mg/dL — ABNORMAL HIGH (ref 70–99)
Potassium: 3.7 mmol/L (ref 3.5–5.1)
Sodium: 138 mmol/L (ref 135–145)
Total Bilirubin: 0.3 mg/dL (ref 0.3–1.2)
Total Protein: 7.4 g/dL (ref 6.5–8.1)

## 2019-08-04 LAB — CBC WITH DIFFERENTIAL (CANCER CENTER ONLY)
Abs Immature Granulocytes: 0.03 10*3/uL (ref 0.00–0.07)
Basophils Absolute: 0.1 10*3/uL (ref 0.0–0.1)
Basophils Relative: 1 %
Eosinophils Absolute: 0.9 10*3/uL — ABNORMAL HIGH (ref 0.0–0.5)
Eosinophils Relative: 10 %
HCT: 31.3 % — ABNORMAL LOW (ref 39.0–52.0)
Hemoglobin: 9.9 g/dL — ABNORMAL LOW (ref 13.0–17.0)
Immature Granulocytes: 0 %
Lymphocytes Relative: 27 %
Lymphs Abs: 2.3 10*3/uL (ref 0.7–4.0)
MCH: 30.3 pg (ref 26.0–34.0)
MCHC: 31.6 g/dL (ref 30.0–36.0)
MCV: 95.7 fL (ref 80.0–100.0)
Monocytes Absolute: 0.5 10*3/uL (ref 0.1–1.0)
Monocytes Relative: 6 %
Neutro Abs: 4.8 10*3/uL (ref 1.7–7.7)
Neutrophils Relative %: 56 %
Platelet Count: 390 10*3/uL (ref 150–400)
RBC: 3.27 MIL/uL — ABNORMAL LOW (ref 4.22–5.81)
RDW: 13.4 % (ref 11.5–15.5)
WBC Count: 8.6 10*3/uL (ref 4.0–10.5)
nRBC: 0 % (ref 0.0–0.2)

## 2019-08-04 MED ORDER — SODIUM CHLORIDE 0.9% FLUSH
10.0000 mL | INTRAVENOUS | Status: DC | PRN
  Administered 2019-08-04: 10 mL
  Filled 2019-08-04: qty 10

## 2019-08-04 MED ORDER — HEPARIN SOD (PORK) LOCK FLUSH 100 UNIT/ML IV SOLN
500.0000 [IU] | Freq: Once | INTRAVENOUS | Status: AC | PRN
  Administered 2019-08-04: 500 [IU]
  Filled 2019-08-04: qty 5

## 2019-08-04 NOTE — Patient Instructions (Signed)

## 2019-08-04 NOTE — Progress Notes (Signed)
Hematology and Oncology Follow Up Visit  Daniel Howard [id number] 08/28/1956 63 y.o. 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    Prior Therapy:   He is status post biopsy on 03/16/2017 of a pelvic mass which confirmed the presence of prostate cancer.  Taxotere chemotherapy at 75 mg/m started on 04/24/2017. He S/P 6 cycles of therapy completed in August 2018.  Current therapy:  Androgen deprivation therapy ongoing at [facility].  Xtandi 160 mg daily started in November 2019.  Interim History: Daniel Howard is here for repeat evaluation.  Since last visit, he reports no major changes in his health.  He was seen in the emergency department on August 01, 2019 for abdominal pain and did not require any hospitalization.  He did have a CT scan of the abdomen and pelvis without any obstruction.  Since that time he has reports no recent complaints.  Continues to tolerate Xtandi without any abdominal pain, hematuria or flank pain.  He remains active and continues to attend activities of daily living.  He denied headaches, blurry vision, syncope or seizures.  Denies any fevers, chills or sweats.  Denied chest pain, palpitation, orthopnea or leg edema.  Denied cough, wheezing or hemoptysis.  Denied nausea, vomiting or abdominal pain.  Denies any constipation or diarrhea.  Denies any frequency urgency or hesitancy.  Denies any arthralgias or myalgias.  Denies any skin rashes or lesions.  Denies any bleeding or clotting tendency.  Denies any easy bruising.  Denies any hair or nail changes.  Denies any anxiety or depression.  Remaining review of system is negative.        Medications: Updated today on review. Current Outpatient Medications  Medication Sig Dispense Refill  . Adalimumab (HUMIRA PEN) 40 MG/0.8ML PNKT Inject 1 Syringe into the  skin once a week. 4 each 3  . albuterol (VENTOLIN HFA) 108 (90 Base) MCG/ACT inhaler Inhale 2 puffs into the lungs every 4 (four) hours as needed for wheezing or shortness of breath (cough, shortness of breath or wheezing.). 8 g 0  . azithromycin (ZITHROMAX) 250 MG tablet Take 2 tabs PO x 1 dose, then 1 tab PO QD x 4 days (Patient not taking: Reported on 08/01/2019) 6 tablet 0  . benzonatate (TESSALON) 100 MG capsule Take 1-2 capsules (100-200 mg total) by mouth 3 (three) times daily as needed for cough. 60 capsule 0  . budesonide (ENTOCORT EC) 3 MG 24 hr capsule TAKE 3 CAPSULES BY MOUTH EVERY DAY (Patient taking differently: Take 9 mg by mouth daily. ) 270 capsule 1  . Calcium Carb-Cholecalciferol (CALCIUM 600-D PO) Take 2 tablets by mouth daily.    . ferrous sulfate 325 (65 FE) MG tablet Take 325 mg by mouth daily with breakfast.    . lidocaine-prilocaine (EMLA) cream Apply 1 application topically daily as needed (when accessing port).     . mirtazapine (REMERON) 15 MG tablet TAKE 1 TABLET BY MOUTH AT BEDTIME (Patient taking differently: Take 15 mg by mouth at bedtime. ) 90 tablet 1  . Multiple Vitamins-Minerals (CENTRUM SILVER 50+MEN) TABS Take 1 tablet by mouth daily with breakfast.    . oxyCODONE-acetaminophen (PERCOCET) 10-325 MG tablet Take 1 tablet by mouth every 8 (eight) hours as needed for pain. 60 tablet 0  . polyethylene glycol (MIRALAX) 17 g packet Take 17 g by mouth daily. You can take 17 g,  or as much as needed daily to continue having soft daily stools. 100 each 0  . simethicone (MYLICON) 80 MG chewable tablet Chew 80 mg by mouth 3 (three) times daily as needed (gas/flatulence).     [name] 40 MG capsule TAKE 4 CAPSULES (160 MG TOTAL) BY MOUTH DAILY. (Patient taking differently: Take 160 mg by mouth daily. ) 120 capsule 0   No current facility-administered medications for this visit.    Facility-Administered Medications Ordered in Other Visits  Medication Dose Route Frequency  Provider Last Rate Last Dose  . sodium chloride flush (NS) 0.9 % injection 10 mL  10 mL Intravenous PRN [name], MD   10 mL at 11/20/17 0938  . sodium chloride flush (NS) 0.9 % injection 10 mL  10 mL Intracatheter PRN [name], MD   10 mL at 08/04/19 1020     Allergies:  Allergies  Allergen Reactions  . Penicillins Hives    Has patient had a PCN reaction causing immediate rash, facial/tongue/throat swelling, SOB or lightheadedness with hypotension: Yes Has patient had a PCN reaction causing severe rash involving mucus membranes or skin necrosis: No Has patient had a PCN reaction that required hospitalization: No Has patient had a PCN reaction occurring within the last 10 years: No If all of the above answers are "NO", then may proceed with Cephalosporin use.     Past Medical History, Surgical history, Social history, and Family History remain without any changes on review.  Physical Exam:  Blood pressure 128/71, pulse 82, temperature 99.1 F (37.3 C), temperature source Temporal, resp. rate 17, height 5' 4.5" (1.638 m), weight 118 lb 4 oz (53.6 kg), SpO2 100 %.    ECOG: 1    General appearance: Alert, awake without any distress. Head: Atraumatic without abnormalities Oropharynx: Without any thrush or ulcers. Eyes: No scleral icterus. Lymph nodes: No lymphadenopathy noted in the cervical, supraclavicular, or axillary nodes Heart:regular rate and rhythm, without any murmurs or gallops.   Lung: Clear to auscultation without any rhonchi, wheezes or dullness to percussion. Abdomin: Soft, nontender without any shifting dullness or ascites. Musculoskeletal: No clubbing or cyanosis. Neurological: No motor or sensory deficits. Skin: No rashes or lesions. Psychiatric: Mood and affect appeared normal.     Lab Results: Lab Results  Component Value Date   WBC 11.2 (H) 08/01/2019   HGB 11.2 (L) 08/01/2019   HCT 36.3 (L) 08/01/2019   MCV 97.8 08/01/2019   PLT 468  (H) 08/01/2019     Chemistry      Component Value Date/Time   NA 136 08/01/2019 0937   NA 137 11/20/2017 0931   K 4.2 08/01/2019 0937   K 3.3 (L) 11/20/2017 0931   CL 103 08/01/2019 0937   CO2 25 08/01/2019 0937   CO2 27 11/20/2017 0931   BUN 9 08/01/2019 0937   BUN 16.6 11/20/2017 0931   CREATININE 1.00 08/01/2019 0937   CREATININE 1.02 12/01/2018 0920   CREATININE 0.99 12/02/2017 1249   CREATININE 1.2 11/20/2017 0931      Component Value Date/Time   CALCIUM 8.7 (L) 08/01/2019 0937   CALCIUM 8.9 11/20/2017 0931   ALKPHOS 73 08/01/2019 0937   ALKPHOS 62 11/20/2017 0931   AST 31 08/01/2019 0937   AST 16 12/01/2018 0920   AST 38 (H) 11/20/2017 0931   ALT 13 08/01/2019 0937   ALT 6 12/01/2018 0920   ALT 45 11/20/2017 0931   BILITOT 0.4 08/01/2019 0937   BILITOT 0.7  12/01/2018 0920   BILITOT 0.66 11/20/2017 0931     [name], Daniel Howard (MRN [id number]) as of 08/04/2019 10:32  Ref. Range 10/21/2018 09:27 12/01/2018 09:20  Prostate Specific Ag, Serum Latest Ref Range: 0.0 - 4.0 ng/mL 6.4 (H) 3.7   IMPRESSION: 1. Roughly similar appearance of dilated colon extending towards the presumed right colostomy loop, I tend to favor some type of adhesion in the vicinity of image 47/3 as a likely cause for the mildly dilated loops in this vicinity. 2. Large left Spigelian hernia containing small and large bowel. 3. Worsening periaortic adenopathy, dominant left periaortic lymph node currently 2.6 cm in diameter. 4. Similar appearance of mass involving the right side of the prostate gland, right seminal vesicle, right pelvic sidewall, and right anterior rectum. 5. 2 similar appearance and pattern of sclerotic metastatic disease of the pelvis and right proximal femur. 6. Localized volume loss and bronchiectasis medially in the right middle lobe. 7. Chronic mild dilatation of the dorsal pancreatic duct. Stable indistinct hypodensity in the vicinity of the ampulla,  nonspecific. 8. Small dependent stones in the left posterior urinary bladder. 9. Paucity of intra-adipose tissue along with some mesenteric edema and trace fluid in the right paracolic gutter. The appearance makes it difficult to separate out loops of bowel from potential mesenteric lymph nodes. 10. Fused bilateral sacroiliac joints.   Impression and Plan:   63 year old man with:  1.  Castration-resistant prostate cancer with disease to the bone and lymphadenopathy diagnosed in 2018. t  He is currently on Xtandi without any major complications with a PSA showed reasonable response previously.  Risks and benefits of continuing this treatment long-term was discussed.  Potential complication occluding hypertension, seizures and hematuria were reiterated.  Different salvage therapy including systemic chemotherapy form of Jevtana will be an alternative salvage therapy.  CT scan obtained on 08/01/2019 was personally reviewed and reiterated today to the patient and appears to have stable disease and very mild lymphatic progression.  The plan is to continue to monitor his PSA periodically and use systemic chemotherapy as a salvage option if he develops progression of disease.   2. IV access: Port-A-Cath remains in place without any changes and will continue to be flushed periodically.   3. Androgen deprivation therapy: Long-term complications associated with this therapy was reviewed and I recommended continuing indefinitely.  Skeletal receiving it under the care of [facility].  4.  Prognosis and goals of care: Therapy remains palliative although aggressive measures are warranted given his reasonable performance status performance status.   5. Follow-up: In 3 months for repeat evaluation.  25 minutes were spent face-to-face with the patient today.  More than 50% of the time was spent on updating his disease status, reviewing imaging studies as well as discussing treatment options for the  future.   Zola Button, MD 8/13/202010:31 AM

## 2019-08-05 ENCOUNTER — Telehealth: Payer: Self-pay | Admitting: Oncology

## 2019-08-05 LAB — PROSTATE-SPECIFIC AG, SERUM (LABCORP): Prostate Specific Ag, Serum: 15.8 ng/mL — ABNORMAL HIGH (ref 0.0–4.0)

## 2019-08-05 NOTE — Telephone Encounter (Signed)
Called and spoke with patients wife. Confirmed November appt

## 2019-08-09 DIAGNOSIS — Z933 Colostomy status: Secondary | ICD-10-CM | POA: Diagnosis not present

## 2019-08-09 DIAGNOSIS — K5090 Crohn's disease, unspecified, without complications: Secondary | ICD-10-CM | POA: Diagnosis not present

## 2019-08-15 DIAGNOSIS — H5213 Myopia, bilateral: Secondary | ICD-10-CM | POA: Diagnosis not present

## 2019-08-15 DIAGNOSIS — H25813 Combined forms of age-related cataract, bilateral: Secondary | ICD-10-CM | POA: Diagnosis not present

## 2019-08-15 DIAGNOSIS — H52223 Regular astigmatism, bilateral: Secondary | ICD-10-CM | POA: Diagnosis not present

## 2019-08-19 ENCOUNTER — Other Ambulatory Visit: Payer: Self-pay

## 2019-08-19 MED ORDER — OXYCODONE-ACETAMINOPHEN 10-325 MG PO TABS: 1.0000 | ORAL_TABLET | Freq: Three times a day (TID) | ORAL | 0 refills | Status: DC | PRN

## 2019-08-19 NOTE — Telephone Encounter (Signed)
Requested Prescriptions   Pending Prescriptions Disp Refills  . oxyCODONE-acetaminophen (PERCOCET) 10-325 MG tablet 60 tablet 0    Sig: Take 1 tablet by mouth every 8 (eight) hours as needed for pain.    Last OV 04/20/2019  Last written 07/18/2019

## 2019-08-22 ENCOUNTER — Emergency Department (HOSPITAL_COMMUNITY)
Admission: EM | Admit: 2019-08-22 | Discharge: 2019-08-22 | Disposition: A | Discharge status: Home / Self Care | Payer: Medicare Other | Attending: Emergency Medicine | Admitting: Emergency Medicine

## 2019-08-22 ENCOUNTER — Emergency Department (HOSPITAL_COMMUNITY): Payer: Medicare Other

## 2019-08-22 ENCOUNTER — Other Ambulatory Visit: Payer: Self-pay

## 2019-08-22 ENCOUNTER — Other Ambulatory Visit: Payer: Self-pay | Admitting: Oncology

## 2019-08-22 ENCOUNTER — Encounter (HOSPITAL_COMMUNITY): Payer: Self-pay

## 2019-08-22 DIAGNOSIS — Z8546 Personal history of malignant neoplasm of prostate: Secondary | ICD-10-CM | POA: Insufficient documentation

## 2019-08-22 DIAGNOSIS — R0602 Shortness of breath: Secondary | ICD-10-CM | POA: Diagnosis not present

## 2019-08-22 DIAGNOSIS — Z85830 Personal history of malignant neoplasm of bone: Secondary | ICD-10-CM | POA: Diagnosis not present

## 2019-08-22 DIAGNOSIS — Z79899 Other long term (current) drug therapy: Secondary | ICD-10-CM | POA: Insufficient documentation

## 2019-08-22 DIAGNOSIS — D649 Anemia, unspecified: Secondary | ICD-10-CM | POA: Insufficient documentation

## 2019-08-22 DIAGNOSIS — C61 Malignant neoplasm of prostate: Secondary | ICD-10-CM

## 2019-08-22 DIAGNOSIS — Z87891 Personal history of nicotine dependence: Secondary | ICD-10-CM | POA: Insufficient documentation

## 2019-08-22 DIAGNOSIS — R079 Chest pain, unspecified: Secondary | ICD-10-CM | POA: Diagnosis not present

## 2019-08-22 DIAGNOSIS — Z880 Allergy status to penicillin: Secondary | ICD-10-CM | POA: Insufficient documentation

## 2019-08-22 DIAGNOSIS — R059 Cough, unspecified: Secondary | ICD-10-CM

## 2019-08-22 DIAGNOSIS — R05 Cough: Secondary | ICD-10-CM | POA: Diagnosis not present

## 2019-08-22 LAB — CBC WITH DIFFERENTIAL/PLATELET
Abs Immature Granulocytes: 0.03 10*3/uL (ref 0.00–0.07)
Basophils Absolute: 0.1 10*3/uL (ref 0.0–0.1)
Basophils Relative: 1 %
Eosinophils Absolute: 0.7 10*3/uL — ABNORMAL HIGH (ref 0.0–0.5)
Eosinophils Relative: 7 %
HCT: 27.6 % — ABNORMAL LOW (ref 39.0–52.0)
Hemoglobin: 8.6 g/dL — ABNORMAL LOW (ref 13.0–17.0)
Immature Granulocytes: 0 %
Lymphocytes Relative: 22 %
Lymphs Abs: 2 10*3/uL (ref 0.7–4.0)
MCH: 30.1 pg (ref 26.0–34.0)
MCHC: 31.2 g/dL (ref 30.0–36.0)
MCV: 96.5 fL (ref 80.0–100.0)
Monocytes Absolute: 0.6 10*3/uL (ref 0.1–1.0)
Monocytes Relative: 7 %
Neutro Abs: 5.8 10*3/uL (ref 1.7–7.7)
Neutrophils Relative %: 63 %
Platelets: 468 10*3/uL — ABNORMAL HIGH (ref 150–400)
RBC: 2.86 MIL/uL — ABNORMAL LOW (ref 4.22–5.81)
RDW: 13 % (ref 11.5–15.5)
WBC: 9.3 10*3/uL (ref 4.0–10.5)
nRBC: 0 % (ref 0.0–0.2)

## 2019-08-22 LAB — BASIC METABOLIC PANEL
Anion gap: 9 (ref 5–15)
BUN: 13 mg/dL (ref 8–23)
CO2: 25 mmol/L (ref 22–32)
Calcium: 8.5 mg/dL — ABNORMAL LOW (ref 8.9–10.3)
Chloride: 103 mmol/L (ref 98–111)
Creatinine, Ser: 1.04 mg/dL (ref 0.61–1.24)
GFR calc Af Amer: >60 mL/min (ref >60)
GFR calc non Af Amer: >60 mL/min (ref >60)
Glucose, Bld: 91 mg/dL (ref 70–99)
Potassium: 3.9 mmol/L (ref 3.5–5.1)
Sodium: 137 mmol/L (ref 135–145)

## 2019-08-22 MED ORDER — IOHEXOL 350 MG/ML SOLN
75.0000 mL | Freq: Once | INTRAVENOUS | Status: AC | PRN
  Administered 2019-08-22: 18:00:00 via INTRAVENOUS

## 2019-08-22 MED ORDER — DOXYCYCLINE HYCLATE 100 MG PO CAPS: 100.0000 mg | ORAL_CAPSULE | Freq: Two times a day (BID) | ORAL | 0 refills | Status: DC

## 2019-08-22 NOTE — ED Provider Notes (Signed)
[facility] EMERGENCY DEPARTMENT Provider Note   CSN: [id number] Arrival date & time: 08/22/19  1226     History   Chief Complaint Chief Complaint  Patient presents with   Cough    HPI Daniel Howard is a 63 y.o. male.     The history is provided by the patient and medical records. No language interpreter was used.  Cough Associated symptoms: shortness of breath   Associated symptoms: no wheezing    Daniel Howard is a 63 y.o. male  with an extensive PMH as listed below who presents to the Emergency Department complaining of persistent cough for the last 6 months.  Patient states that it is intermittently productive of mucus and this is been persistent over the last 6 months as well.  He reports shortness of breath, but only with coughing fits or when he gets a large bit of mucus up, he feels as if he cannot breathe.  He does not have any shortness of breath outside of these episodes.  He denies any leg pain or swelling.  No recent fevers.  He states at the onset of this, he was diagnosed with pneumonia and was treated with antibiotics and steroids.  Other symptoms improved, but cough has been persistent.  He has not had any steroids in the last couple of months.  He has been trying Mucinex and albuterol inhalers with little improvement.   Past Medical History:  Diagnosis Date   Abnormal finding of biliary tract    MRCP shows pancreatic/biliary tract dilation. EUS 2010 confirmed dilation but no chronic pancreaitis or mass. Vascular ectasia crimpoing distal CBD.    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.s/p transverse loop colostomy secondary to stricture 1992., followed by end-transverse ostomy, followed by right hemicolectomy, followed  by takedown & ileostomy    Duodenal ulcer 2010   nsaids   History of blood transfusion 1992   "related to colon OR"   Peristomal hernia    Prostate cancer ([place]) 2018   SBO (small bowel obstruction) ([place]) 11/10/2018   Small bowel obstruction ([place]) 10/2017; 11/21/2017; 02/16/2018   Spigelian hernia    bilateral    Patient Active Problem List   Diagnosis Date Noted   Intra-abdominal abscess ([place])    Incarcerated hernia 01/09/2019   Palliative care encounter    Small bowel obstruction due to adhesions ([place]) 12/29/2018   Small bowel obstruction ([place]) 12/03/2018   Partial small bowel obstruction ([place]) 11/01/2018   SBO (small bowel obstruction) ([place]) 09/30/2018   Iron deficiency anemia 07/03/2018   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   Nausea without vomiting 11/10/2017   Incisional hernia 09/22/2017   Abdominal pain of multiple sites 09/22/2017   Goals of care, counseling/discussion 04/02/2017   Prostate cancer metastatic to bone ([place]) 04/02/2017   Elevated PSA 03/04/2017   Osteopenia 11/03/2016   Pelvic mass in male 09/04/2016   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   Exacerbation of Crohn's disease ([place]) 09/04/2016   Family history of colon cancer 10/12/2014   Protein-calorie malnutrition, severe ([place]) 07/27/2014   Loss of weight 06/09/2014   Crohn's disease of both small and large intestine with complication ([place]) 40/98/1191   Boils 02/11/2013   Ventral hernia 12/11/2009   Anemia 12/03/2009   Regional enteritis/Crohn's 01/25/2007    Past Surgical History:  Procedure Laterality Date   APPENDECTOMY  1992   at time of exp laparotomy at which time he was noted to have fistulizing Crohn's rather than UC   COLON SURGERY     COLONOSCOPY [name] 08/23/2014   [id number] proctoscopy with possible fistulous opening in thebase of rectal/anal stump.     COLOSTOMY  1992   transverse loop colostomy secondary to a stricture   ESOPHAGOGASTRODUODENOSCOPY  05/2009   SLF: multiple antral  erosions, large ulcer at ansatomosis (postsurgical changes at duodenal bulb and second portion of duodenum) BX c/x NSAIDS.   EUS  10/04/2009   Dr. Estill Bakes dilated CBD and main pancreatic duct.  No pancreatic   EXPLORATORY LAPAROTOMY  1992   FLEXIBLE SIGMOIDOSCOPY  1988   Dr. Laural Golden- suggested rohn's disease but the biopsies were not collaborative.   FLEXIBLE SIGMOIDOSCOPY [name] 09/08/2016   Procedure: FLEXIBLE SIGMOIDOSCOPY;  Surgeon: Wonda Horner, MD;  Location: [facility] ENDOSCOPY;  Service: Gastroenterology;  Laterality: [name];   [place]   incarcerated periostial hernia with additional surgery in [place] RIGHT  04/03/2017   IR US GUIDE VASC ACCESS RIGHT  04/03/2017        Home Medications    Prior to Admission medications   Medication Sig Start Date End Date Taking? Authorizing Provider  Adalimumab (HUMIRA PEN) 40 MG/0.8ML PNKT Inject 1 Syringe into the skin once a week. 06/12/19   Mahala Menghini, PA-C  albuterol (VENTOLIN HFA) 108 (90 Base) MCG/ACT inhaler Inhale 2 puffs into the lungs every 4 (four) hours as needed for wheezing or shortness of breath (cough, shortness of breath or wheezing.). 07/16/19   Scot Jun, FNP  azithromycin (ZITHROMAX) 250 MG tablet Take 2 tabs PO x 1 dose, then 1 tab PO QD x 4 days Patient not taking: Reported on 08/01/2019 07/16/19   Scot Jun, FNP  benzonatate (TESSALON) 100 MG capsule Take 1-2 capsules (100-200 mg total) by mouth 3 (three) times daily as needed for cough. 07/16/19   Scot Jun, FNP  budesonide (ENTOCORT EC) 3 MG 24 hr capsule TAKE 3 CAPSULES BY MOUTH EVERY DAY Patient taking differently: Take 9 mg by mouth daily.  06/27/19   Mahala Menghini, PA-C  Calcium Carb-Cholecalciferol (CALCIUM 600-D PO) Take 2 tablets by mouth daily.    [provider]  doxycycline (VIBRAMYCIN) 100 MG capsule Take 1 capsule (100 mg  total) by mouth 2 (two) times daily. 08/22/19   Yurem Viner, Ozella Almond, PA-C  ferrous sulfate 325 (65 FE) MG tablet Take 325 mg by mouth daily with breakfast.    [provider]  lidocaine-prilocaine (EMLA) cream Apply 1 application topically daily as needed (when accessing port).     [provider]  mirtazapine (REMERON) 15 MG tablet TAKE 1 TABLET BY MOUTH AT BEDTIME Patient taking differently: Take 15 mg by mouth at bedtime.  03/21/19   [place], [name], MD  Multiple Vitamins-Minerals (CENTRUM SILVER 50+MEN) TABS Take 1 tablet by mouth daily with breakfast.    [provider]  oxyCODONE-acetaminophen (PERCOCET) 10-325 MG tablet Take 1 tablet by mouth every 8 (eight) hours as needed for pain. 08/19/19   Alycia Rossetti, MD  polyethylene glycol (MIRALAX) 17 g packet Take 17 g by mouth daily. You can take 17 g, or as much as needed daily to continue having soft daily stools. 08/01/19   Dorian Heckle,  Mitzi Hansen, MD  simethicone (MYLICON) 80 MG chewable tablet Chew 80 mg by mouth 3 (three) times daily as needed (gas/flatulence).     [provider]  XTANDI 40 MG capsule TAKE 4 CAPSULES (160 MG TOTAL) BY MOUTH DAILY. Patient taking differently: Take 160 mg by mouth daily.  07/21/19   [name], MD    Family History Family History  Problem Relation Age of Onset   Cancer Father        prostate    Prostate cancer Father    Colon cancer Father 47   Hypertension Sister    Cancer Sister    Depression Sister    Breast cancer Sister    COPD Sister    Aneurysm Brother        deceased, brain aneurysm    Social History Social History   Tobacco Use   Smoking status: Former Smoker    Packs/day: 0.75    Years: 32.00    Pack years: 24.00    Types: Cigarettes    Quit date: 12/21/2009    Years since quitting: 9.6   Smokeless tobacco: Never Used  Substance Use Topics   Alcohol use: No    Alcohol/week: 0.0 standard drinks    Comment: Former drinker    Drug use: No     Allergies   Penicillins   Review of Systems Review of Systems  Respiratory: Positive for cough and shortness of breath. Negative for wheezing.   All other systems reviewed and are negative.    Physical Exam Updated Vital Signs BP 123/83    Pulse 75    Temp 98.6 F (37 C) (Oral)    Resp 14    SpO2 98%   Physical Exam Vitals signs and nursing note reviewed.  Constitutional:      General: He is not in acute distress.    Appearance: He is well-developed.  HENT:     Head: Normocephalic and atraumatic.  Neck:     Musculoskeletal: Neck supple.  Cardiovascular:     Rate and Rhythm: Normal rate and regular rhythm.     Heart sounds: Normal heart sounds. No murmur.  Pulmonary:     Effort: Pulmonary effort is normal. No respiratory distress.     Breath sounds: Normal breath sounds. No wheezing.  Abdominal:     General: There is no distension.     Palpations: Abdomen is soft.     Tenderness: There is no abdominal tenderness.  Musculoskeletal:     Comments: No lower extremity swelling or calf tenderness.  Skin:    General: Skin is warm and dry.  Neurological:     Mental Status: He is alert and oriented to person, place, and time.      ED Treatments / Results  Labs (all labs ordered are listed, but only abnormal results are displayed) Labs Reviewed  CBC WITH DIFFERENTIAL/PLATELET - Abnormal; Notable for the following components:      Result Value   RBC 2.86 (*)    Hemoglobin 8.6 (*)    HCT 27.6 (*)    Platelets 468 (*)    Eosinophils Absolute 0.7 (*)    All other components within normal limits  BASIC METABOLIC PANEL - Abnormal; Notable for the following components:   Calcium 8.5 (*)    All other components within normal limits    EKG None  Radiology Dg Chest 2 View  Result Date: 08/22/2019 CLINICAL DATA:  Chest pain, shortness of breath. EXAM: CHEST - 2 VIEW COMPARISON:  Radiographs of July 16, 2019. CT scan of August 01, 2019. FINDINGS:  Stable cardiomediastinal silhouette. Left lung is clear. Stable nodular density is noted in right infrahilar region. Right internal jugular Port-A-Cath is unchanged in position. No pneumothorax or pleural effusion is noted. The visualized skeletal structures are unremarkable. IMPRESSION: Stable nodular density seen in right infrahilar region which may represent atelectasis, scarring or possible nodule. Further evaluation with CT scan of the chest is recommended. Electronically Signed   By: Marijo Conception M.D.   On: 08/22/2019 15:14   Ct Angio Chest Pe W And/or Wo Contrast  Result Date: 08/22/2019 CLINICAL DATA:  Cough. EXAM: CT ANGIOGRAPHY CHEST WITH CONTRAST TECHNIQUE: Multidetector CT imaging of the chest was performed using the standard protocol during bolus administration of intravenous contrast. Multiplanar CT image reconstructions and MIPs were obtained to evaluate the vascular anatomy. CONTRAST:  <See Chart> OMNIPAQUE IOHEXOL 350 MG/ML SOLN COMPARISON:  February 20, 2019 FINDINGS: Cardiovascular: Contrast injection is sufficient to demonstrate satisfactory opacification of the pulmonary arteries to the segmental level. There is no pulmonary embolus. The main pulmonary artery is mildly dilated measuring approximately 3 cm. There is no CT evidence of acute right heart strain. The visualized aorta is normal. Heart size is normal, without pericardial effusion. Mediastinum/Nodes: --there is bulky mediastinal and hilar adenopathy which has significantly progressed since prior study. For example there is a right hilar lymph node currently measuring approximately 2.7 cm in the short axis (previously measuring approximately 1 cm). --No axillary lymphadenopathy. --No supraclavicular lymphadenopathy. --Normal thyroid gland. --The esophagus is unremarkable Lungs/Pleura: There are scattered areas of consolidation for example involving the right lower lobe and right middle lobe. There is diffuse bronchial wall thickening,  especially at the lung bases with areas of mucous plugging. There is no significant pleural effusion. No pneumothorax. Upper Abdomen: No acute abnormality. Musculoskeletal: There is a well-positioned right-sided Port-A-Cath with tip terminating near the cavoatrial junction. There is no displaced fracture. Review of the MIP images confirms the above findings. IMPRESSION: 1. No pulmonary embolus. 2. Significant interval worsening of mediastinal and hilar adenopathy, concerning for nodal metastatic disease. 3. Scattered areas of consolidation with diffuse bronchial wall thickening is concerning for infectious bronchiolitis in the appropriate clinical setting. Electronically Signed   By: Constance Holster M.D.   On: 08/22/2019 18:48    Procedures Procedures (including critical care time)  Medications Ordered in ED Medications  iohexol (OMNIPAQUE) 350 MG/ML injection 75 mL ( Intravenous Contrast Given 08/22/19 1802)     Initial Impression / Assessment and Plan / ED Course  I have reviewed the triage vital signs and the nursing notes.  Pertinent labs & imaging results that were available during my care of the patient were reviewed by me and considered in my medical decision making (see chart for details).       ACIE CUSTIS is a 63 y.o. male who presents to ED for cough x 6 months with worsening increased sputum production over the last week or so. Afebrile, hemodynamically stable. Labs reviewed and anemia noted. Denies blood in stool or other sources of bleeding. No lightheadedness, fatigue. Tells me that he has an appointment with one of his doctors tomorrow. Does not appear to be actively bleeding or symptomatic from anemia. Not needing transfusion currently. Recommend repeat labs this week and discussed reasons to return to ER sooner for such.   CT shows no PE, but does show diffuse bronchial wall thickening concerning for infectious etiology as well as significant  interval worsening of his  mediastinal and hilar adenopathy concerning for metastatic disease.  Discussed the CT findings with him at length.  He will discuss with his primary doctor as well as his oncologist.  We will start him on course of doxycycline.  Again discussed reasons to return to the ER.  All questions answered.  Final Clinical Impressions(s) / ED Diagnoses   Final diagnoses:  Cough  Anemia, unspecified type    ED Discharge Orders         Ordered    doxycycline (VIBRAMYCIN) 100 MG capsule  2 times daily     08/22/19 1906           Anitria Andon, Ozella Almond, PA-C 08/22/19 1918    Lucrezia Starch, MD 08/25/19 [phone]

## 2019-08-22 NOTE — ED Triage Notes (Signed)
Pt reports having a cough for 6 months. Pt has history of metastic cancer was put on mucinex, prednisone and other medications for cough with no improvement. Pt reports a lot of mucus.

## 2019-08-22 NOTE — Discharge Instructions (Signed)
Please take all of your antibiotics until finished!  Return to ER for new or worsening symptoms, any additional concerns.

## 2019-08-23 ENCOUNTER — Ambulatory Visit (INDEPENDENT_AMBULATORY_CARE_PROVIDER_SITE_OTHER): Payer: Medicare Other | Admitting: Internal Medicine

## 2019-08-23 ENCOUNTER — Encounter: Payer: Self-pay | Admitting: Internal Medicine

## 2019-08-23 VITALS — BP 126/80 | HR 93 | Temp 97.1°F | Ht 64.5 in | Wt 118.6 lb

## 2019-08-23 DIAGNOSIS — K50012 Crohn's disease of small intestine with intestinal obstruction: Secondary | ICD-10-CM | POA: Diagnosis not present

## 2019-08-23 DIAGNOSIS — C7800 Secondary malignant neoplasm of unspecified lung: Secondary | ICD-10-CM

## 2019-08-23 DIAGNOSIS — C61 Malignant neoplasm of prostate: Secondary | ICD-10-CM | POA: Diagnosis not present

## 2019-08-23 NOTE — Progress Notes (Signed)
Primary Care Physician:  Alycia Rossetti, MD Primary Gastroenterologist:  Dr. Gala Romney  Pre-Procedure History & Physical: HPI:  Daniel Howard is a 63 y.o. male here for follow-up of complicated large small bowel Crohn's, widely metastatic prostate cancer.  His weight is down 22 pounds since we saw him in person in December of last year.  Humira has been held by oncology given ongoing chemotherapy and widespread carcinoma.  He continues on Entocort 9 mg daily.  He has had multiple admissions and ED visits for SBO and respiratory tract infections so far this year.  Was seen in the ED at [facility] just yesterday felt to have bronchitis prescribed doxycycline 100 mg twice daily.  He states he is able to eat he does not vomit; he reports his ostomy is functioning well.  He realizes that he has a great deal of difficulty with raw vegetables/fruit.  If he has any at all he has to cook them well.  He has been on hospice but is seen by them infrequently.  He has been on hospice service for several months.  Has not seen Dr. [name] recently.  He does note increase productive cough at night with some shortness of breath.  Recent CT negative for PE but positive for progression of hilar adenopathy consistent with a metastatic prostate cancer.  Past Medical History:  Diagnosis Date  . Abnormal finding of biliary tract    MRCP shows pancreatic/biliary tract dilation. EUS 2010 confirmed dilation but no chronic pancreaitis or mass. Vascular ectasia crimpoing distal CBD.   [name] Anxiety   . 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 Duodenal ulcer 2010   nsaids  .  History of blood transfusion 1992   "related to colon OR"  . Peristomal hernia   . Prostate cancer ([place]) 2018  . SBO (small bowel obstruction) ([place]) 11/10/2018  . Small bowel obstruction ([place]) 10/2017; 11/21/2017; 02/16/2018  . Spigelian hernia    bilateral    Past Surgical History:  Procedure Laterality Date  . APPENDECTOMY  1992   at time of exp laparotomy at which time he was noted to have fistulizing Crohn's rather than UC  . COLON SURGERY    . COLONOSCOPY [name] 08/23/2014   [id number] proctoscopy with possible fistulous opening in thebase of rectal/anal stump.    . COLOSTOMY  1992   transverse loop colostomy secondary to a stricture  . ESOPHAGOGASTRODUODENOSCOPY  05/2009   SLF: multiple antral erosions, large ulcer at ansatomosis (postsurgical changes at duodenal bulb and second portion of duodenum) BX c/x NSAIDS.  . EUS  10/04/2009   Dr. Estill Bakes dilated CBD and main pancreatic duct.  No pancreatic  . EXPLORATORY LAPAROTOMY  1992  . FLEXIBLE SIGMOIDOSCOPY  1988   Dr. Laural Golden- suggested rohn's disease but the biopsies were not collaborative.  [name] FLEXIBLE SIGMOIDOSCOPY [name] 09/08/2016   Procedure: FLEXIBLE SIGMOIDOSCOPY;  Surgeon: Wonda Horner, MD;  Location: [facility] ENDOSCOPY;  Service: Gastroenterology;  Laterality: [name];  . HEMICOLOECTOMY W/ ANASTOMOSIS  1993   R- Dr.DeMason   . HERNIA REPAIR  1996   incarcerated periostial hernia with additional surgery in 1999  . IR FLUORO GUIDE PORT INSERTION RIGHT  04/03/2017  .  IR US GUIDE VASC ACCESS RIGHT  04/03/2017    Prior to Admission medications   Medication Sig Start Date End Date Taking? Authorizing Provider  albuterol (VENTOLIN HFA) 108 (90 Base) MCG/ACT inhaler Inhale 2 puffs into the lungs every 4 (four) hours as needed for wheezing or shortness of breath (cough, shortness of breath or wheezing.). 07/16/19  Yes Scot Jun, FNP  budesonide (ENTOCORT EC) 3 MG 24 hr capsule TAKE 3 CAPSULES BY MOUTH EVERY DAY Patient taking  differently: Take 9 mg by mouth daily.  06/27/19  Yes Mahala Menghini, PA-C  Calcium Carb-Cholecalciferol (CALCIUM 600-D PO) Take 2 tablets by mouth daily.   Yes [provider]  doxycycline (VIBRAMYCIN) 100 MG capsule Take 1 capsule (100 mg total) by mouth 2 (two) times daily. 08/22/19  [place], Ozella Almond, PA-C  ferrous sulfate 325 (65 FE) MG tablet Take 325 mg by mouth daily with breakfast.   Yes [provider]  lidocaine-prilocaine (EMLA) cream Apply 1 application topically daily as needed (when accessing port).    Yes [provider]  mirtazapine (REMERON) 15 MG tablet TAKE 1 TABLET BY MOUTH AT BEDTIME Patient taking differently: Take 15 mg by mouth at bedtime.  03/21/19  Yes [place], [name], MD  Multiple Vitamins-Minerals (CENTRUM SILVER 50+MEN) TABS Take 1 tablet by mouth daily with breakfast.   Yes [provider]  oxyCODONE-acetaminophen (PERCOCET) 10-325 MG tablet Take 1 tablet by mouth every 8 (eight) hours as needed for pain. 08/19/19  Yes [place], [name], MD  polyethylene glycol (MIRALAX) 17 g packet Take 17 g by mouth daily. You can take 17 g, or as much as needed daily to continue having soft daily stools. 08/01/19  Yes Tillie Fantasia, MD  simethicone (MYLICON) 80 MG chewable tablet Chew 80 mg by mouth 3 (three) times daily as needed (gas/flatulence).    Yes [provider]  XTANDI 40 MG capsule TAKE 4 CAPSULES (160 MG TOTAL) BY MOUTH DAILY. 08/23/19  Yes [name], MD  Adalimumab (HUMIRA PEN) 40 MG/0.8ML PNKT Inject 1 Syringe into the skin once a week. Patient not taking: Reported on 08/23/2019 06/12/19   Mahala Menghini, PA-C    Allergies as of 08/23/2019 - Review Complete 08/23/2019  Allergen Reaction Noted  . Penicillins Hives 01/25/2007    Family History  Problem Relation Age of Onset  . Cancer Father        prostate   . Prostate cancer Father   . Colon cancer Father 75  . Hypertension Sister   . Cancer Sister   .  Depression Sister   . Breast cancer Sister   . COPD Sister   . Aneurysm Brother        deceased, brain aneurysm    Social History   Socioeconomic History  . Marital status: Married    Spouse name: Not on file  . Number of children: 51  . Years of education: Not on file  . Highest education level: Not on file  Occupational History  . Occupation: disabled    Fish farm manager: UNEMPLOYED  Social Needs  . Financial resource strain: Not on file  . Food insecurity    Worry: Not on file    Inability: Not on file  . Transportation needs    Medical: Not on file    Non-medical: Not on file  Tobacco Use  . Smoking status: Former Smoker    Packs/day: 0.75    Years: 32.00    Pack years:  24.00    Types: Cigarettes    Quit date: 12/21/2009    Years since quitting: 9.6  . Smokeless tobacco: Never Used  Substance and Sexual Activity  . Alcohol use: No    Alcohol/week: 0.0 standard drinks    Comment: Former drinker  . Drug use: No  . Sexual activity: Not on file  Lifestyle  . Physical activity    Days per week: Not on file    Minutes per session: Not on file  . Stress: Not on file  Relationships  . Social Herbalist on phone: Not on file    Gets together: Not on file    Attends religious service: Not on file    Active member of club or organization: Not on file    Attends meetings of clubs or organizations: Not on file    Relationship status: Not on file  . Intimate partner violence    Fear of current or ex partner: Not on file    Emotionally abused: Not on file    Physically abused: Not on file    Forced sexual activity: Not on file  Other Topics Concern  . Not on file  Social History Narrative  . Not on file    Review of Systems: See HPI, otherwise negative ROS  Physical Exam: BP 126/80   Pulse 93   Temp (!) 97.1 F (36.2 C) (Oral)   Ht 5' 4.5" (1.638 m)   Wt 118 lb 9.6 oz (53.8 kg)   BMI 20.04 kg/m  General:   Alert,   pleasant and cooperative in NAD  Heart:  Regular rate and rhythm; no murmurs, clicks, rubs,  or gallops. Abdomen: Nondistended.  Positive bowel sounds.    Multiple keloid scars.  Ostomy looks healthy right lower quadrant.  Nontender.  Abdominal wall hernias appreciable. Pulses:  Normal pulses noted. Extremities:  Without clubbing or edema.  Impression/Plan: Very pleasant 63 year old gentleman who unfortunately has a long history of most complicated Crohn's disease and now widely metastatic prostate cancer.  Recurrent episodes small bowel obstruction.  He has not required surgery.  In fact, he be a very poor surgical candidate.  He has difficulties with tolerating higher residue diets which is certainly understandable.  He is not on Humira -  will likely not be restarted on that medication.  Continues on Entocort 9 mg daily.  I would like to  modestly de-escalalte's Entocort to 6 mg daily.  Recent bronchitis being treated with doxycycline increase phlegm production  Recommendations:  Decrease Entocort to 6 mg daily  Information on low residue, low roughage diet  Follow up with Dr. [name] regarding productive cough  Follow up here virtually or in person in 8 weeks    Notice: This dictation was prepared with Dragon dictation along with smaller phrase technology. Any transcriptional errors that result from this process are unintentional and may not be corrected upon review.

## 2019-08-23 NOTE — Patient Instructions (Signed)
Decrease Entocort to 6 mg daily  Information on low residue, low roughage diet  Follow up with Dr. [name] regarding productive cough  Follow up here virtually or in person in 8 weeks

## 2019-08-25 DIAGNOSIS — C7951 Secondary malignant neoplasm of bone: Secondary | ICD-10-CM | POA: Diagnosis not present

## 2019-08-25 MED FILL — XTANDI 40 MG CAPSULE: 40 | 30 days supply | Qty: 120 | Fill #0

## 2019-09-10 ENCOUNTER — Other Ambulatory Visit: Payer: Self-pay | Admitting: Family Medicine

## 2019-09-19 ENCOUNTER — Other Ambulatory Visit: Payer: Self-pay | Admitting: Oncology

## 2019-09-19 DIAGNOSIS — C61 Malignant neoplasm of prostate: Secondary | ICD-10-CM

## 2019-09-23 NOTE — Progress Notes (Signed)
Daniel Howard      DOB: 05-12-1956  Purpose of Visit: follow up visit Ordering provider:  [name], DNP  Medications: Is the patient taking all medications listed on MAR from Epic? Yes List any medications that are not being taken correctly:  List any medication refills needed:  Requesting albuterol/atrovent nebulizer  Is the patient able to pick up medications? Yes  Vitals: BP: 130/88    HR: 86    Oxygen: 98%RA Weight: 112.4lbs (had on 2 shirts & pj bottoms)         Any patient concerns? States morphine has been helping at night but doesn't as much during the day. States his colostomy bag often had a lot of gas but not a lot of stool.  States he only uses his miralax about 1x/month.  Also requesting to have a nebulizer w/meds of his own.  He has used his wife's machine on occasion when he coughs and has benefit from it.  He reports having had some teeth pulled within the last week and is only able to eat very soft foods.  States they're healing well and looking forward to solid foods.   [name], DNP made aware of the requests.  Also passes on to Mr. Doo he can take miralax BID as tolerated per Sharyn Lull.       Vanita Ingles, RN 09/23/19

## 2019-09-26 MED FILL — XTANDI 40 MG CAPSULE: 40 | 30 days supply | Qty: 120 | Fill #0

## 2019-09-27 DIAGNOSIS — C7951 Secondary malignant neoplasm of bone: Secondary | ICD-10-CM | POA: Diagnosis not present

## 2019-10-03 ENCOUNTER — Other Ambulatory Visit: Payer: Self-pay | Admitting: Family Medicine

## 2019-10-03 MED ORDER — OXYCODONE-ACETAMINOPHEN 10-325 MG PO TABS: 1.0000 | ORAL_TABLET | Freq: Three times a day (TID) | ORAL | 0 refills | Status: DC | PRN

## 2019-10-03 NOTE — Telephone Encounter (Signed)
Patient needs refill on oxycodone  [facility]

## 2019-10-03 NOTE — Telephone Encounter (Signed)
Ok to refill??  Last office visit 04/20/2019.  Last refill 08/19/2019.

## 2019-10-04 ENCOUNTER — Other Ambulatory Visit: Payer: Self-pay

## 2019-10-04 ENCOUNTER — Ambulatory Visit (INDEPENDENT_AMBULATORY_CARE_PROVIDER_SITE_OTHER): Payer: Medicare Other | Admitting: Internal Medicine

## 2019-10-04 ENCOUNTER — Encounter: Payer: Self-pay | Admitting: Internal Medicine

## 2019-10-04 VITALS — BP 121/84 | HR 78 | Temp 97.1°F | Ht 64.0 in | Wt 117.8 lb

## 2019-10-04 DIAGNOSIS — K50012 Crohn's disease of small intestine with intestinal obstruction: Secondary | ICD-10-CM

## 2019-10-04 NOTE — Progress Notes (Signed)
Primary Care Physician:  Alycia Rossetti, MD Primary Gastroenterologist:  Dr. Gala Romney  Pre-Procedure History & Physical: HPI:  Daniel Howard is a 63 y.o. male here for follow-up of complicated large small bowel Crohn's disease with permanent ostomy.  Coexisting widely metastatic prostate cancer.  Last saw him September 1.  He continues to do well on Entocort 6 mg daily.  Occasionally he has somewhat sluggish "thick" ostomy output for which he takes MiraLAX on the order of 2 times weekly.  He is not had any bleeding.  No upper abdominal pain nausea or vomiting.  Reports appetite remains intact.  Came off Humira months ago due to concerns about immunosuppression. Weight remains stable. It is remarkable patient tells me that he keeps busy by mowing his neighbors yards using a riding lawn more and weed eats.  He says he likes to keep busy. He tells me he has received the flu vaccine this year but says he has not received a pneumonia vaccine.   Past Medical History:  Diagnosis Date  . Abnormal finding of biliary tract    MRCP shows pancreatic/biliary tract dilation. EUS 2010 confirmed dilation but no chronic pancreaitis or mass. Vascular ectasia crimpoing distal CBD.   [name] Anxiety   . 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 Duodenal ulcer 2010   nsaids  . History of blood transfusion 1992   "related to colon OR"  . Peristomal hernia   . Prostate cancer ([place]) 2018  . SBO (small bowel obstruction) ([place]) 11/10/2018  . Small bowel obstruction ([place]) 10/2017; 11/21/2017; 02/16/2018  . Spigelian hernia    bilateral    Past Surgical History:  Procedure  Laterality Date  . APPENDECTOMY  1992   at time of exp laparotomy at which time he was noted to have fistulizing Crohn's rather than UC  . COLON SURGERY    . COLONOSCOPY [name] 08/23/2014   CLE:XNTZGYF proctoscopy with possible fistulous opening in thebase of rectal/anal stump.    . COLOSTOMY  1992   transverse loop colostomy secondary to a stricture  . ESOPHAGOGASTRODUODENOSCOPY  05/2009   SLF: multiple antral erosions, large ulcer at ansatomosis (postsurgical changes at duodenal bulb and second portion of duodenum) BX c/x NSAIDS.  . EUS  10/04/2009   Dr. Estill Bakes dilated CBD and main pancreatic duct.  No pancreatic  . EXPLORATORY LAPAROTOMY  1992  . FLEXIBLE SIGMOIDOSCOPY  1988   Dr. Laural Golden- suggested rohn's disease but the biopsies were not collaborative.  [name] FLEXIBLE SIGMOIDOSCOPY [name] 09/08/2016   Procedure: FLEXIBLE SIGMOIDOSCOPY;  Surgeon: Wonda Horner, MD;  Location: [facility] ENDOSCOPY;  Service: Gastroenterology;  Laterality: [name];  . HEMICOLOECTOMY W/ ANASTOMOSIS  1993   R- Dr.DeMason   . HERNIA REPAIR  1996   incarcerated periostial hernia with additional surgery in 1999  . IR FLUORO GUIDE PORT INSERTION RIGHT  04/03/2017  . IR US GUIDE VASC ACCESS RIGHT  04/03/2017    Prior to Admission medications   Medication Sig Start Date End Date Taking? Authorizing Provider  albuterol (VENTOLIN HFA) 108 (90 Base) MCG/ACT inhaler Inhale 2 puffs into the lungs every 4 (four) hours as needed for wheezing or shortness of breath (cough, shortness of  breath or wheezing.). 07/16/19  Yes Scot Jun, FNP  budesonide (ENTOCORT EC) 3 MG 24 hr capsule TAKE 3 CAPSULES BY MOUTH EVERY DAY Patient taking differently: Take 6 mg by mouth daily.  06/27/19  Yes Mahala Menghini, PA-C  Calcium Carb-Cholecalciferol (CALCIUM 600-D PO) Take 2 tablets by mouth daily.   Yes [provider]  doxycycline (VIBRAMYCIN) 100 MG capsule Take 1 capsule (100 mg total) by mouth 2 (two) times daily. 08/22/19  Yes Ward,  Ozella Almond, PA-C  ferrous sulfate 325 (65 FE) MG tablet Take 325 mg by mouth daily with breakfast.   Yes [provider]  lidocaine-prilocaine (EMLA) cream Apply 1 application topically daily as needed (when accessing port).    Yes [provider]  mirtazapine (REMERON) 15 MG tablet TAKE 1 TABLET BY MOUTH EVERYDAY AT BEDTIME 09/12/19  Yes Bee, [name], MD  Multiple Vitamins-Minerals (CENTRUM SILVER 50+MEN) TABS Take 1 tablet by mouth daily with breakfast.   Yes [provider]  oxyCODONE-acetaminophen (PERCOCET) 10-325 MG tablet Take 1 tablet by mouth every 8 (eight) hours as needed for pain. 10/03/19  Yes [place], [name], MD  polyethylene glycol (MIRALAX) 17 g packet Take 17 g by mouth daily. You can take 17 g, or as much as needed daily to continue having soft daily stools. 08/01/19  Yes Tillie Fantasia, MD  simethicone (MYLICON) 80 MG chewable tablet Chew 80 mg by mouth 3 (three) times daily as needed (gas/flatulence).    Yes [provider]  XTANDI 40 MG capsule TAKE 4 CAPSULES (160 MG TOTAL) BY MOUTH DAILY. 09/20/19  Yes [name], MD  Adalimumab (HUMIRA PEN) 40 MG/0.8ML PNKT Inject 1 Syringe into the skin once a week. Patient not taking: Reported on 08/23/2019 06/12/19   Mahala Menghini, PA-C    Allergies as of 10/04/2019 - Review Complete 10/04/2019  Allergen Reaction Noted  . Penicillins Hives 01/25/2007    Family History  Problem Relation Age of Onset  . Cancer Father        prostate   . Prostate cancer Father   . Colon cancer Father 35  . Hypertension Sister   . Cancer Sister   . Depression Sister   . Breast cancer Sister   . COPD Sister   . Aneurysm Brother        deceased, brain aneurysm    Social History   Socioeconomic History  . Marital status: Married    Spouse name: Not on file  . Number of children: 51  . Years of education: Not on file  . Highest education level: Not on file  Occupational History  . Occupation:  disabled    Fish farm manager: UNEMPLOYED  Social Needs  . Financial resource strain: Not on file  . Food insecurity    Worry: Not on file    Inability: Not on file  . Transportation needs    Medical: Not on file    Non-medical: Not on file  Tobacco Use  . Smoking status: Former Smoker    Packs/day: 0.75    Years: 32.00    Pack years: 24.00    Types: Cigarettes    Quit date: 12/21/2009    Years since quitting: 9.7  . Smokeless tobacco: Never Used  Substance and Sexual Activity  . Alcohol use: No    Alcohol/week: 0.0 standard drinks    Comment: Former drinker  . Drug use: No  . Sexual activity: Not on file  Lifestyle  . Physical activity  Days per week: Not on file    Minutes per session: Not on file  . Stress: Not on file  Relationships  . Social Herbalist on phone: Not on file    Gets together: Not on file    Attends religious service: Not on file    Active member of club or organization: Not on file    Attends meetings of clubs or organizations: Not on file    Relationship status: Not on file  . Intimate partner violence    Fear of current or ex partner: Not on file    Emotionally abused: Not on file    Physically abused: Not on file    Forced sexual activity: Not on file  Other Topics Concern  . Not on file  Social History Narrative  . Not on file    Review of Systems: See HPI, otherwise negative ROS  Physical Exam: BP 121/84   Pulse 78   Temp (!) 97.1 F (36.2 C) (Oral)   Ht 5' 4"  (1.626 m)   Wt 117 lb 12.8 oz (53.4 kg)   BMI 20.22 kg/m  General:   Alert,  , pleasant and cooperative in NAD Lungs:  Clear throughout to auscultation.   No wheezes, crackles, or rhonchi. No acute distress. Heart:  Regular rate and rhythm; no murmurs, clicks, rubs,  or gallops. Abdomen: Nondistended.  Ostomy appears healthy right lower quadrant multiple abdominal wall hernias and keloid scars present.   Pulses:  Normal pulses noted. Extremities:  Without clubbing or  edema.  Impression/Plan: Very, very pleasant 63 year old gentleman with metastatic prostate cancer complicated ileocolonic Crohn's disease status post multiple surgeries and permanent ostomy.  Previously taken off the Humira due to concerns of immunosuppression.  He is tolerating Entocort very well.  No ED visits since his last visit here which is remarkable in itself.  It appears this both his Crohn's and prostate cancer remain stable and he appears to have a pretty good quality of life overall.  Recommendations: Stay on Entocort 6 mg daily indefinitely - as discussed, the benefits appear to outweigh the risks.  We specifically discussed the risk including the class effect of long-term corticosteroids.  Hopefully, with a significant first pass effect of budesonide he will less let likely have problems over time.  Patient to ask Dr. [name] about pneumonia vaccine  May use Miralax as needed  OV in 3 months and as needed              Notice: This dictation was prepared with Dragon dictation along with smaller phrase technology. Any transcriptional errors that result from this process are unintentional and may not be corrected upon review.

## 2019-10-04 NOTE — Patient Instructions (Addendum)
Stay on Entocort 6 mg daily indefinitely - as discussed, the benefits appear to outweigh the risks  Ask Dr. [name] about pneumonia vaccine  May use Miralax as needed  OV in 3 months and as needed

## 2019-10-10 ENCOUNTER — Other Ambulatory Visit: Payer: Self-pay

## 2019-10-10 ENCOUNTER — Ambulatory Visit (INDEPENDENT_AMBULATORY_CARE_PROVIDER_SITE_OTHER): Payer: Medicare Other | Admitting: Family Medicine

## 2019-10-10 ENCOUNTER — Encounter: Payer: Self-pay | Admitting: Family Medicine

## 2019-10-10 ENCOUNTER — Ambulatory Visit (HOSPITAL_COMMUNITY)
Admission: RE | Admit: 2019-10-10 | Discharge: 2019-10-10 | Disposition: A | Discharge status: Home / Self Care | Payer: Medicare Other | Source: Ambulatory Visit | Attending: Family Medicine | Admitting: Family Medicine

## 2019-10-10 VITALS — BP 124/66 | HR 82 | Temp 98.6°F | Resp 16 | Ht 64.0 in | Wt 116.0 lb

## 2019-10-10 DIAGNOSIS — D530 Protein deficiency anemia: Secondary | ICD-10-CM

## 2019-10-10 DIAGNOSIS — R05 Cough: Secondary | ICD-10-CM | POA: Diagnosis not present

## 2019-10-10 DIAGNOSIS — E43 Unspecified severe protein-calorie malnutrition: Secondary | ICD-10-CM

## 2019-10-10 DIAGNOSIS — R053 Chronic cough: Secondary | ICD-10-CM

## 2019-10-10 DIAGNOSIS — C61 Malignant neoplasm of prostate: Secondary | ICD-10-CM

## 2019-10-10 DIAGNOSIS — Z23 Encounter for immunization: Secondary | ICD-10-CM

## 2019-10-10 DIAGNOSIS — C7951 Secondary malignant neoplasm of bone: Secondary | ICD-10-CM

## 2019-10-10 MED ORDER — ONDANSETRON 4 MG PO TBDP: 4.0000 mg | ORAL_TABLET | Freq: Three times a day (TID) | ORAL | 2 refills | Status: AC | PRN

## 2019-10-10 MED ORDER — OXYCODONE-ACETAMINOPHEN 10-325 MG PO TABS: 1.0000 | ORAL_TABLET | Freq: Three times a day (TID) | ORAL | 0 refills | Status: AC | PRN

## 2019-10-10 NOTE — Patient Instructions (Addendum)
Chest Xray to be done  Pain medication refilled with 90 tablets  zofran for nausea  Use the inhaler  F/U pending results

## 2019-10-10 NOTE — Progress Notes (Signed)
Subjective:    Patient ID: Daniel Howard, male    DOB: 1956/10/04, 63 y.o.   MRN: [id number]  Patient presents for Cough (x 7 months- lingering cough- productive cough with yellowish mucus) and Medication Management (increased pain- taking 1.5 tabs of pain med without relief)  Patient here with chronic pain associated with his metastatic prostate cancer.  He also has hilar lymphadenopathy which is contributing to chronic cough.  He has been seen by oncology for this.  Unfortunately his PSA levels continue to go up.  He is getting palliative care.  For the cough he has been treated with antibiotics he has had x-rays done last a couple months ago.  His cough is sometimes dry sometimes it is productive.  He has not had any fever or chills.  He is also had Covid testing multiple times which was negative.  Regarding his chronic pain he has been on Percocet 10/325 but has been taking 1-1/2 he is scheduled to take this twice a day as needed for pain.  He does have morphine sublingual given by palliative care as needed cough but does not use this regularly.  He has chronic nausea associated with his cancer and his Crohn's disease. Review Of Systems:  GEN- denies fatigue, fever, weight loss,weakness, recent illness HEENT- denies eye drainage, change in vision, nasal discharge, CVS- denies chest pain, palpitations RESP- denies SOB, cough, wheeze ABD- denies N/V, change in stools, +abd pain GU- denies dysuria, hematuria, dribbling, incontinence MSK- denies joint pain, muscle aches, injury Neuro- denies headache, dizziness, syncope, seizure activity       Objective:    BP 124/66   Pulse 82   Temp 98.6 F (37 C) (Oral)   Resp 16   Ht 5' 4"  (1.626 m)   Wt 116 lb (52.6 kg)   SpO2 98%   BMI 19.91 kg/m  GEN- NAD, alert and oriented x3, chronically ill-appearing HEENT- PERRL, EOMI, non injected sclera, pink conjunctiva, MMM, oropharynx clear Neck- Supple, no thyromegaly CVS- RRR, no  murmur RESP-chest she had rhonchi bilaterally occasional wheeze normal work of breathing at rest sat 98% ABD-NABS,soft,NT,ND ostomy intact EXT- No edema Pulses- Radial 2+        Assessment & Plan:      Problem List Items Addressed This Visit      Unprioritized   Anemia    Anemia of chronic disease along with underlying malignancy.  We will recheck his hemoglobin      Relevant Orders   CBC with Differential/Platelet (Completed)   Comprehensive metabolic panel (Completed)   Prostate cancer metastatic to bone [facility])    Metastatic prostate cancer with chronic pain chronic cough.  I will increase his Percocet to 3 times a day #90 quantity he also has the morphine sublingual for what sounds like cough shortness of breath from palliative care.  He has had problems with obstruction and constipation some been to hold off on putting him on long-acting oxycodone at this time.  I refilled Zofran ODT for his nausea.  Regarding the cough we did obtain a stat chest x-ray this was clear there is no pneumonia advised him to use Mucinex DM he also has an albuterol inhaler on hand.      Relevant Medications   ondansetron (ZOFRAN ODT) 4 MG disintegrating tablet   Protein-calorie malnutrition, severe (HCC)   Relevant Orders   CBC with Differential/Platelet (Completed)   Comprehensive metabolic panel (Completed)    Other Visit Diagnoses    Chronic cough    -  Primary   Relevant Orders   DG Chest 2 View (Completed)   Need for immunization against influenza       Relevant Orders   Flu Vaccine QUAD 36+ mos IM (Completed)      Note: This dictation was prepared with Dragon dictation along with smaller phrase technology. Any transcriptional errors that result from this process are unintentional.

## 2019-10-11 ENCOUNTER — Encounter: Payer: Self-pay | Admitting: Family Medicine

## 2019-10-11 LAB — COMPREHENSIVE METABOLIC PANEL
AG Ratio: 0.6 (calc) — ABNORMAL LOW (ref 1.0–2.5)
ALT: 3 U/L — ABNORMAL LOW (ref 9–46)
AST: 19 U/L (ref 10–35)
Albumin: 2.7 g/dL — ABNORMAL LOW (ref 3.6–5.1)
Alkaline phosphatase (APISO): 76 U/L (ref 35–144)
BUN: 9 mg/dL (ref 7–25)
CO2: 27 mmol/L (ref 20–32)
Calcium: 8.5 mg/dL — ABNORMAL LOW (ref 8.6–10.3)
Chloride: 103 mmol/L (ref 98–110)
Creat: 0.81 mg/dL (ref 0.70–1.25)
Globulin: 4.2 g/dL (calc) — ABNORMAL HIGH (ref 1.9–3.7)
Glucose, Bld: 95 mg/dL (ref 65–99)
Potassium: 4.8 mmol/L (ref 3.5–5.3)
Sodium: 139 mmol/L (ref 135–146)
Total Bilirubin: 0.3 mg/dL (ref 0.2–1.2)
Total Protein: 6.9 g/dL (ref 6.1–8.1)

## 2019-10-11 LAB — CBC WITH DIFFERENTIAL/PLATELET
Absolute Monocytes: 540 cells/uL (ref 200–950)
Basophils Absolute: 52 cells/uL (ref 0–200)
Basophils Relative: 0.7 %
Eosinophils Absolute: 266 cells/uL (ref 15–500)
Eosinophils Relative: 3.6 %
HCT: 27.9 % — ABNORMAL LOW (ref 38.5–50.0)
Hemoglobin: 8.9 g/dL — ABNORMAL LOW (ref 13.2–17.1)
Lymphs Abs: 1754 cells/uL (ref 850–3900)
MCH: 27.8 pg (ref 27.0–33.0)
MCHC: 31.9 g/dL — ABNORMAL LOW (ref 32.0–36.0)
MCV: 87.2 fL (ref 80.0–100.0)
MPV: 8.6 fL (ref 7.5–12.5)
Monocytes Relative: 7.3 %
Neutro Abs: 4788 cells/uL (ref 1500–7800)
Neutrophils Relative %: 64.7 %
Platelets: 544 10*3/uL — ABNORMAL HIGH (ref 140–400)
RBC: 3.2 10*6/uL — ABNORMAL LOW (ref 4.20–5.80)
RDW: 12.7 % (ref 11.0–15.0)
Total Lymphocyte: 23.7 %
WBC: 7.4 10*3/uL (ref 3.8–10.8)

## 2019-10-11 IMAGING — DX DG CHEST 2V
2 series · 2 of 2 positions shown · non-contrast
Comparison: 12/18/2017, 12/11/2017,

CLINICAL DATA: Short of breath

EXAM:
CHEST - 2 VIEW

[chest pa]
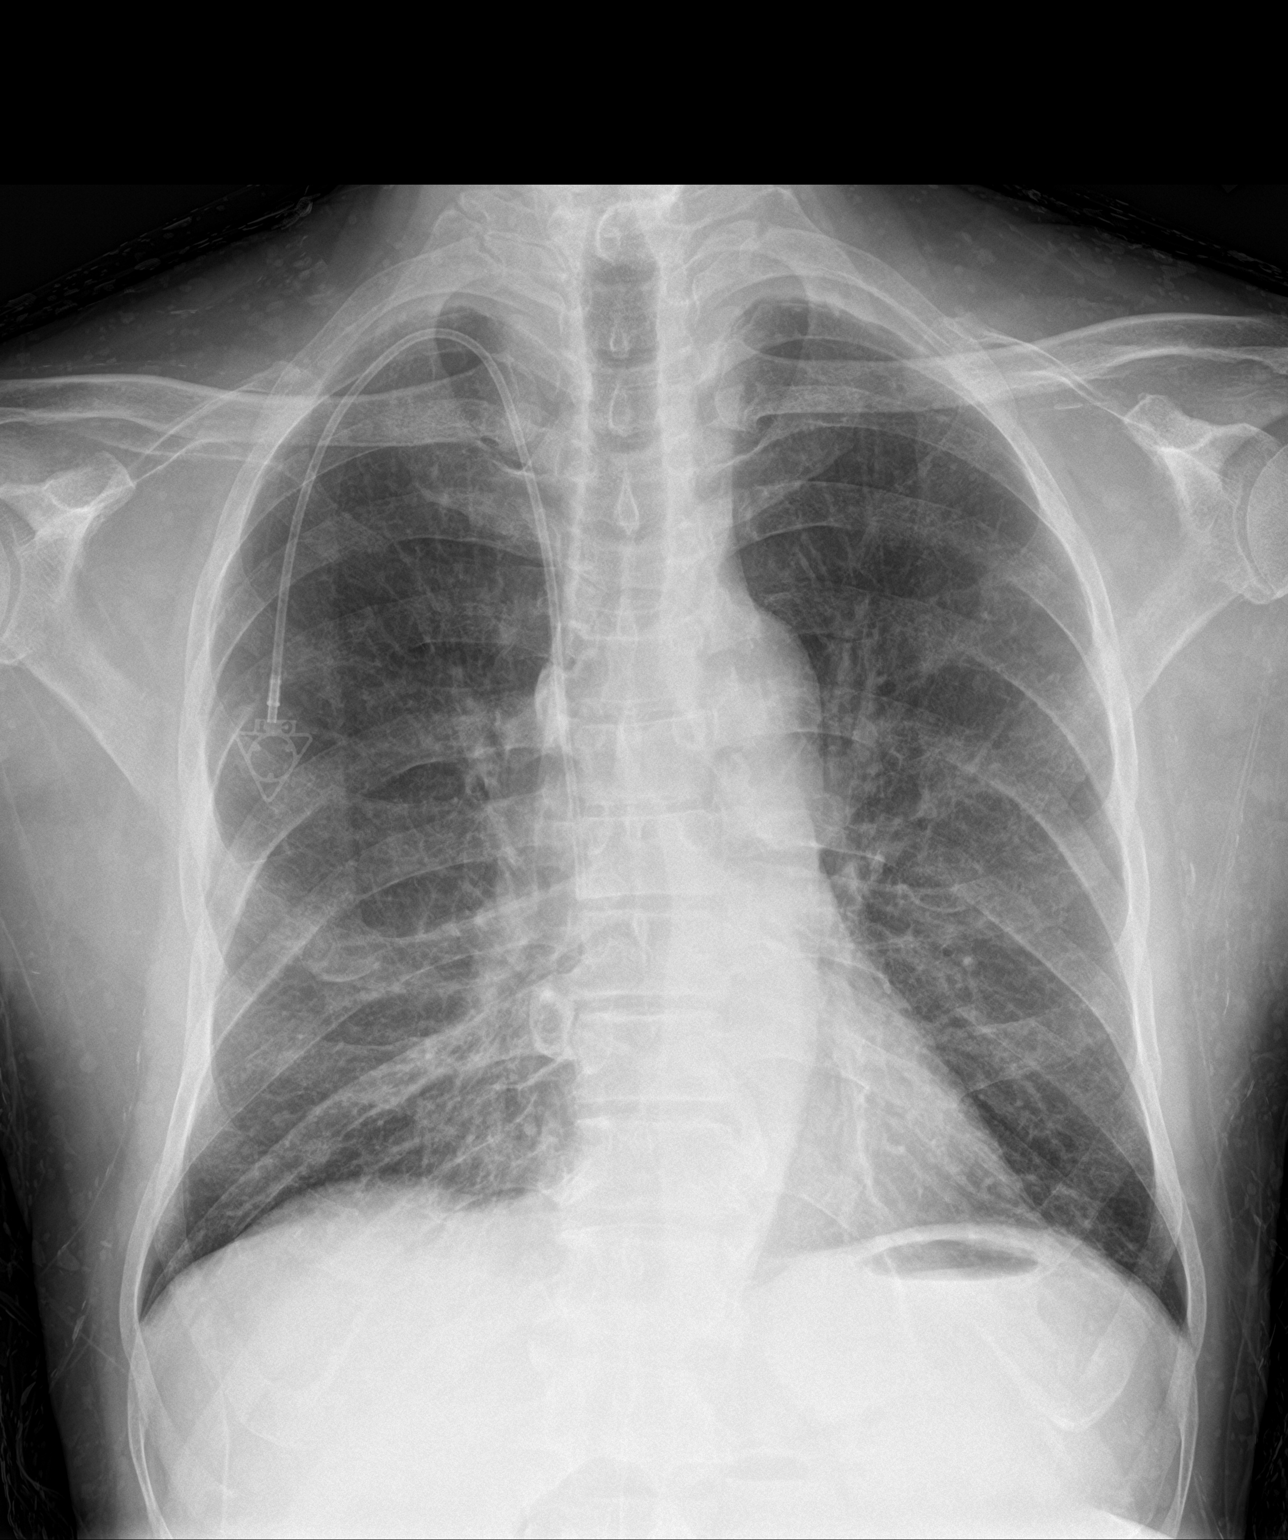

[chest lat]
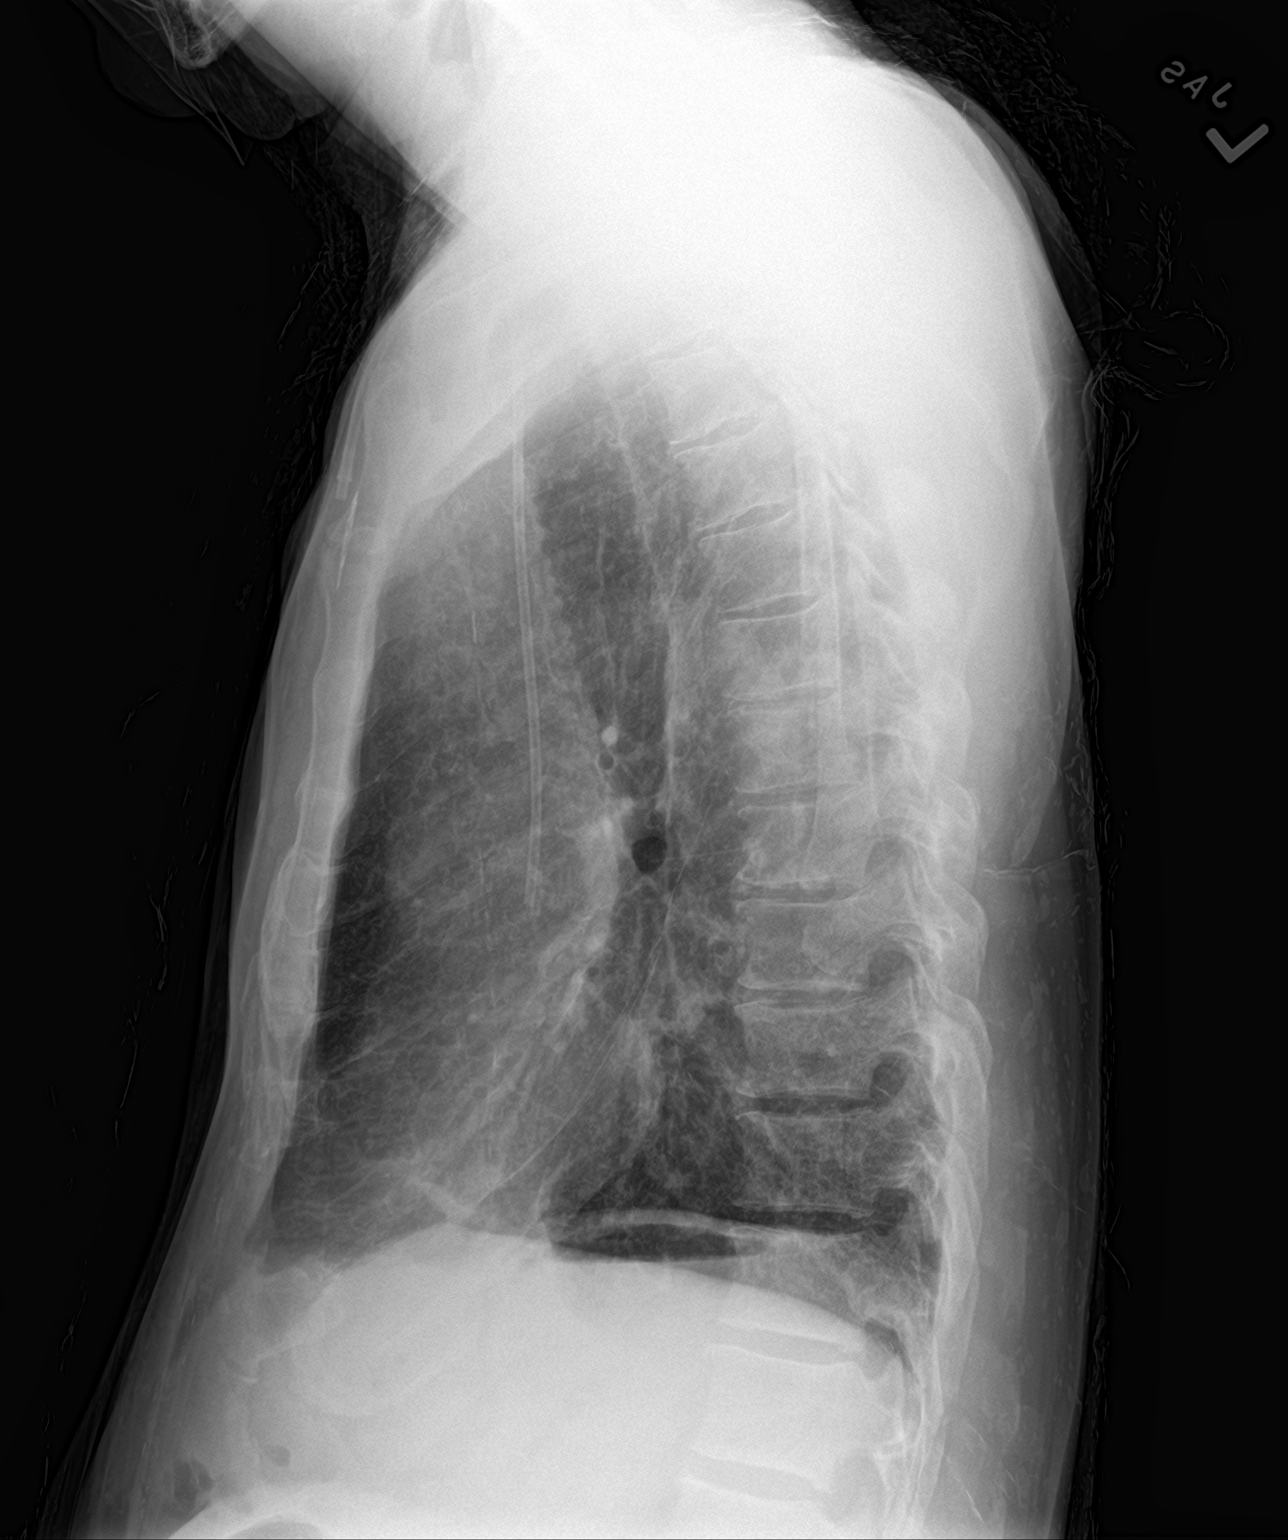

[2 of 2 positions shown; findings below may reference images not displayed]

FINDINGS: Right-sided central venous port tip over the distal SVC.
Hyperinflation with scarring at the bilateral lung bases and chronic
bronchitic changes. Normal cardiomediastinal silhouette. No
pneumothorax.
IMPRESSION: No active cardiopulmonary disease. Similar appearance of
hyperinflation, chronic bronchitic changes and scarring in the
bases.

## 2019-10-11 NOTE — Assessment & Plan Note (Signed)
Metastatic prostate cancer with chronic pain chronic cough.  I will increase his Percocet to 3 times a day #90 quantity he also has the morphine sublingual for what sounds like cough shortness of breath from palliative care.  He has had problems with obstruction and constipation some been to hold off on putting him on long-acting oxycodone at this time.  I refilled Zofran ODT for his nausea.  Regarding the cough we did obtain a stat chest x-ray this was clear there is no pneumonia advised him to use Mucinex DM he also has an albuterol inhaler on hand.

## 2019-10-11 NOTE — Assessment & Plan Note (Signed)
Anemia of chronic disease along with underlying malignancy.  We will recheck his hemoglobin

## 2019-10-13 ENCOUNTER — Telehealth: Payer: Self-pay

## 2019-10-13 NOTE — Telephone Encounter (Signed)
Communication noted.  To be safe, would go to the [facility] ED to be evaluated further

## 2019-10-13 NOTE — Telephone Encounter (Signed)
Pt's wife called and said pt started having blood in his ostomy bag today. She said it looks like right much. Pt is having some nausea also and he feels like he might have a blockage. I told her I will send Dr. Gala Romney a message, but I feel like it would be best for him to go to the ED. Dr. Gala Romney, please advise!

## 2019-10-13 NOTE — Telephone Encounter (Signed)
PT's wife is aware of the recommendation.

## 2019-10-16 NOTE — Progress Notes (Signed)
Home Visit on behalf of [place] on Fri. 10/14/19 to check colostomy bag for blood.  There was no red or black noted.  It was soft stool that was in the bag on exam.  He stated the blood he observed prior was likely due to hard stool but had resolved.  He's been taking miralax daily w/good results otherwise.  Denied any other concerns other acute concerns. Wife was there as well and is very involved in his care.

## 2019-10-19 ENCOUNTER — Other Ambulatory Visit: Payer: Self-pay | Admitting: Oncology

## 2019-10-19 DIAGNOSIS — C61 Malignant neoplasm of prostate: Secondary | ICD-10-CM

## 2019-10-20 DIAGNOSIS — Z933 Colostomy status: Secondary | ICD-10-CM | POA: Diagnosis not present

## 2019-10-20 DIAGNOSIS — K5090 Crohn's disease, unspecified, without complications: Secondary | ICD-10-CM | POA: Diagnosis not present

## 2019-10-21 MED FILL — XTANDI 40 MG CAPSULE: 40 | 30 days supply | Qty: 120 | Fill #0

## 2019-10-23 NOTE — Progress Notes (Signed)
Home visit on behalf of [place] on 10/21/19.  Daniel Howard was driving with his wife as passenger on my arrival to his home for the home appt.  They drove to get [employer] to eat.  He c/o back pain, mainly d/t not having taken his pain meds yet and wanted to put food on his belly before taking them.   Stated his nebulizer arrived yesterday on 10/20/19 so he doesn't have to use his wife's nebulizer any more.  VS: 97.5, 110/72, 87, 18, 97%RA, "last weight 114lbs" He also c/o some dizziness when standing for extended periods.   Colostomy unremarkable.   Runny nose on occasion.  "Coughing has slowed down a bit" No other complaints.

## 2019-11-01 DIAGNOSIS — C7951 Secondary malignant neoplasm of bone: Secondary | ICD-10-CM | POA: Diagnosis not present

## 2019-11-04 ENCOUNTER — Inpatient Hospital Stay: Payer: Medicare Other | Attending: Oncology | Admitting: Oncology

## 2019-11-04 ENCOUNTER — Telehealth: Payer: Self-pay

## 2019-11-04 ENCOUNTER — Inpatient Hospital Stay: Payer: Medicare Other

## 2019-11-04 ENCOUNTER — Other Ambulatory Visit: Payer: Self-pay

## 2019-11-04 ENCOUNTER — Ambulatory Visit: Payer: Medicare Other | Admitting: Internal Medicine

## 2019-11-04 VITALS — BP 110/75 | HR 99 | Temp 98.9°F | Resp 18 | Ht 64.0 in | Wt 107.6 lb

## 2019-11-04 DIAGNOSIS — C7951 Secondary malignant neoplasm of bone: Secondary | ICD-10-CM

## 2019-11-04 DIAGNOSIS — Z79899 Other long term (current) drug therapy: Secondary | ICD-10-CM | POA: Insufficient documentation

## 2019-11-04 DIAGNOSIS — C61 Malignant neoplasm of prostate: Secondary | ICD-10-CM

## 2019-11-04 DIAGNOSIS — R5383 Other fatigue: Secondary | ICD-10-CM | POA: Diagnosis not present

## 2019-11-04 DIAGNOSIS — Z880 Allergy status to penicillin: Secondary | ICD-10-CM | POA: Diagnosis not present

## 2019-11-04 DIAGNOSIS — R634 Abnormal weight loss: Secondary | ICD-10-CM | POA: Insufficient documentation

## 2019-11-04 DIAGNOSIS — M549 Dorsalgia, unspecified: Secondary | ICD-10-CM | POA: Diagnosis not present

## 2019-11-04 DIAGNOSIS — Z192 Hormone resistant malignancy status: Secondary | ICD-10-CM | POA: Insufficient documentation

## 2019-11-04 DIAGNOSIS — G8929 Other chronic pain: Secondary | ICD-10-CM | POA: Diagnosis not present

## 2019-11-04 DIAGNOSIS — R05 Cough: Secondary | ICD-10-CM | POA: Insufficient documentation

## 2019-11-04 DIAGNOSIS — R590 Localized enlarged lymph nodes: Secondary | ICD-10-CM | POA: Insufficient documentation

## 2019-11-04 LAB — CBC WITH DIFFERENTIAL (CANCER CENTER ONLY)
Abs Immature Granulocytes: 0.01 10*3/uL (ref 0.00–0.07)
Basophils Absolute: 0 10*3/uL (ref 0.0–0.1)
Basophils Relative: 0 %
Eosinophils Absolute: 0 10*3/uL (ref 0.0–0.5)
Eosinophils Relative: 0 %
HCT: 31 % — ABNORMAL LOW (ref 39.0–52.0)
Hemoglobin: 9.7 g/dL — ABNORMAL LOW (ref 13.0–17.0)
Immature Granulocytes: 0 %
Lymphocytes Relative: 22 %
Lymphs Abs: 1.3 10*3/uL (ref 0.7–4.0)
MCH: 28.3 pg (ref 26.0–34.0)
MCHC: 31.3 g/dL (ref 30.0–36.0)
MCV: 90.4 fL (ref 80.0–100.0)
Monocytes Absolute: 0.3 10*3/uL (ref 0.1–1.0)
Monocytes Relative: 6 %
Neutro Abs: 4.1 10*3/uL (ref 1.7–7.7)
Neutrophils Relative %: 72 %
Platelet Count: 556 10*3/uL — ABNORMAL HIGH (ref 150–400)
RBC: 3.43 MIL/uL — ABNORMAL LOW (ref 4.22–5.81)
RDW: 17.3 % — ABNORMAL HIGH (ref 11.5–15.5)
WBC Count: 5.8 10*3/uL (ref 4.0–10.5)
nRBC: 0 % (ref 0.0–0.2)

## 2019-11-04 LAB — CMP (CANCER CENTER ONLY)
ALT: 9 U/L (ref 0–44)
AST: 31 U/L (ref 15–41)
Albumin: 2.2 g/dL — ABNORMAL LOW (ref 3.5–5.0)
Alkaline Phosphatase: 126 U/L (ref 38–126)
Anion gap: 12 (ref 5–15)
BUN: 11 mg/dL (ref 8–23)
CO2: 22 mmol/L (ref 22–32)
Calcium: 7.9 mg/dL — ABNORMAL LOW (ref 8.9–10.3)
Chloride: 100 mmol/L (ref 98–111)
Creatinine: 0.76 mg/dL (ref 0.61–1.24)
GFR, Est AFR Am: >60 mL/min (ref >60)
GFR, Estimated: >60 mL/min (ref >60)
Glucose, Bld: 83 mg/dL (ref 70–99)
Potassium: 5.3 mmol/L — ABNORMAL HIGH (ref 3.5–5.1)
Sodium: 134 mmol/L — ABNORMAL LOW (ref 135–145)
Total Bilirubin: 0.4 mg/dL (ref 0.3–1.2)
Total Protein: 8.4 g/dL — ABNORMAL HIGH (ref 6.5–8.1)

## 2019-11-04 NOTE — Telephone Encounter (Signed)
Dr. Alen Blew requested for patient to be referred to hospice services. Spoke to [employer] with [facility] at [phone]) to refer patient to [facility]. [facility] will contact patient to schedule an intake appointment.

## 2019-11-04 NOTE — Progress Notes (Signed)
Hematology and Oncology Follow Up Visit  Daniel Howard [id number] 03-23-1956 63 y.o. 11/04/2019 1:08 PM Daniel Howard, [name], [name], [name], MD   Principle Diagnosis: 38-year man with with advanced prostate cancer to the bone and lymphadenopathy diagnosed in 2018.  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  Prior Therapy:   He is status post biopsy on 03/16/2017 of a pelvic mass which confirmed the presence of prostate cancer.  Taxotere chemotherapy at 75 mg/m started on 04/24/2017. He S/P 6 cycles of therapy completed in August 2018.  Current therapy:  Androgen deprivation therapy ongoing at [facility].  Xtandi 160 mg daily started in November 2019.  Interim History: Daniel Howard returns today for a follow-up.  Since the last visit, he reports continuous decline in his overall health.  He has continued to lose weight and reports excessive fatigue and tiredness.  He is limited in his mobility using a cane inside of his home.  He also has been spending more time in bed with very limited mobility outside of his house.  He denies any recent falls or syncope.  He does report chronic back pain and occasional cough.  He denies any worsening bone pain or pathological fractures.   Patient denied any alteration mental status, neuropathy, confusion or dizziness.  Denies any headaches or lethargy.  Denies any night sweats. Denied orthopnea, dyspnea on exertion or chest discomfort.  Denies shortness of breath, difficulty breathing hemoptysis or cough.  Denies any abdominal distention, nausea, early satiety or dyspepsia.  Denies any hematuria, frequency, dysuria or nocturia.  Denies any skin irritation, dryness or rash.  Denies any ecchymosis or petechiae.  Denies any lymphadenopathy or clotting.  Denies any heat or cold intolerance.  Denies any anxiety or depression.  Remaining review of system is negative.          Medications: Unchanged on review. Current Outpatient Medications   Medication Sig Dispense Refill  . Adalimumab (HUMIRA PEN) 40 MG/0.8ML PNKT Inject 1 Syringe into the skin once a week. 4 each 3  . albuterol (VENTOLIN HFA) 108 (90 Base) MCG/ACT inhaler Inhale 2 puffs into the lungs every 4 (four) hours as needed for wheezing or shortness of breath (cough, shortness of breath or wheezing.). 8 g 0  . budesonide (ENTOCORT EC) 3 MG 24 hr capsule TAKE 3 CAPSULES BY MOUTH EVERY DAY (Patient taking differently: Take 6 mg by mouth daily. ) 270 capsule 1  . Calcium Carb-Cholecalciferol (CALCIUM 600-D PO) Take 2 tablets by mouth daily.    . ferrous sulfate 325 (65 FE) MG tablet Take 325 mg by mouth daily with breakfast.    . lidocaine-prilocaine (EMLA) cream Apply 1 application topically daily as needed (when accessing port).     . mirtazapine (REMERON) 15 MG tablet TAKE 1 TABLET BY MOUTH EVERYDAY AT BEDTIME 90 tablet 1  . Multiple Vitamins-Minerals (CENTRUM SILVER 50+MEN) TABS Take 1 tablet by mouth daily with breakfast.    . ondansetron (ZOFRAN ODT) 4 MG disintegrating tablet Take 1 tablet (4 mg total) by mouth every 8 (eight) hours as needed for nausea or vomiting. 30 tablet 2  . oxyCODONE-acetaminophen (PERCOCET) 10-325 MG tablet Take 1 tablet by mouth every 8 (eight) hours as needed for pain. 90 tablet 0  . polyethylene glycol (MIRALAX) 17 g packet Take 17 g by mouth daily. You can take 17 g, or as much as needed daily to continue having soft daily stools. 100 each 0  . simethicone (MYLICON) 80 MG chewable tablet Chew  80 mg by mouth 3 (three) times daily as needed (gas/flatulence).     [name] 40 MG capsule TAKE 4 CAPSULES (160 MG TOTAL) BY MOUTH DAILY. 120 capsule 0   No current facility-administered medications for this visit.    Facility-Administered Medications Ordered in Other Visits  Medication Dose Route Frequency Provider Last Rate Last Dose  . sodium chloride flush (NS) 0.9 % injection 10 mL  10 mL Intravenous PRN [name], MD   10 mL at 11/20/17  4628     Allergies:  Allergies  Allergen Reactions  . Penicillins Hives    Has patient had a PCN reaction causing immediate rash, facial/tongue/throat swelling, SOB or lightheadedness with hypotension: Yes Has patient had a PCN reaction causing severe rash involving mucus membranes or skin necrosis: No Has patient had a PCN reaction that required hospitalization: No Has patient had a PCN reaction occurring within the last 10 years: No If all of the above answers are "NO", then may proceed with Cephalosporin use.     Past Medical History, Surgical history, Social history, and Family History without any changes on review.  Physical Exam:   Blood pressure 110/75, pulse 99, temperature 98.9 F (37.2 C), temperature source Temporal, resp. rate 18, height 5' 4"  (1.626 m), weight 107 lb 9.6 oz (48.8 kg), SpO2 100 %.    ECOG: 3   General appearance:  Cachectic without any discomfort. Head: Normocephalic without any trauma Oropharynx: Mucous membranes are moist and pink without any thrush or ulcers. Eyes: Pupils are equal and round reactive to light. Lymph nodes: No cervical, supraclavicular, inguinal or axillary lymphadenopathy.   Heart:regular rate and rhythm.  S1 and S2 without leg edema. Lung: Clear without any rhonchi or wheezes.  No dullness to percussion. Abdomin: Soft, nontender, nondistended with good bowel sounds.  No hepatosplenomegaly. Musculoskeletal: No joint deformity or effusion.  Full range of motion noted. Neurological: No deficits noted on motor, sensory and deep tendon reflex exam. Skin: No petechial rash or dryness.  Appeared moist.      Lab Results: Lab Results  Component Value Date   WBC 7.4 10/10/2019   HGB 8.9 (L) 10/10/2019   HCT 27.9 (L) 10/10/2019   MCV 87.2 10/10/2019   PLT 544 (H) 10/10/2019     Chemistry      Component Value Date/Time   NA 139 10/10/2019 1450   NA 137 11/20/2017 0931   K 4.8 10/10/2019 1450   K 3.3 (L) 11/20/2017 0931    CL 103 10/10/2019 1450   CO2 27 10/10/2019 1450   CO2 27 11/20/2017 0931   BUN 9 10/10/2019 1450   BUN 16.6 11/20/2017 0931   CREATININE 0.81 10/10/2019 1450   CREATININE 1.2 11/20/2017 0931      Component Value Date/Time   CALCIUM 8.5 (L) 10/10/2019 1450   CALCIUM 8.9 11/20/2017 0931   ALKPHOS 75 08/04/2019 1011   ALKPHOS 62 11/20/2017 0931   AST 19 10/10/2019 1450   AST 24 08/04/2019 1011   AST 38 (H) 11/20/2017 0931   ALT 3 (L) 10/10/2019 1450   ALT 10 08/04/2019 1011   ALT 45 11/20/2017 0931   BILITOT 0.3 10/10/2019 1450   BILITOT 0.3 08/04/2019 1011   BILITOT 0.66 11/20/2017 0931     Results for ALFONZIA, WOOLUM (MRN [id number]) as of 11/04/2019 13:09  Ref. Range 08/04/2019 10:11  Prostate Specific Ag, Serum Latest Ref Range: 0.0 - 4.0 ng/mL 15.8 (H)   IMPRESSION: 1. No pulmonary embolus.  2. Significant interval worsening of mediastinal and hilar adenopathy, concerning for nodal metastatic disease. 3. Scattered areas of consolidation with diffuse bronchial wall thickening is concerning for infectious bronchiolitis in the appropriate clinical setting.    Impression and Plan:   63 year old man with:  1.  Advanced prostate cancer with disease to the bone and lymphadenopathy diagnosed in 2018.  He has castration resistant disease.  He has been on Xtandi at this time will initial excellent PSA response.  His PSA nadired down to 0.2 in April 2019 and his PSA currently up to 15.8.  Imaging studies from August 2020 did not show any abdominal adenopathy but did show chest adenopathy including mediastinal and hilar adenopathy.  The natural course of this disease and treatment options were reiterated.  Different salvage therapy option at this time would include restarting chemotherapy utilizing Jevtana.  Risks and benefits associated with this therapy include nausea vomiting, myelosuppression and neutropenia.  Alternatively, supportive care and hospice would be reasonable  choice as well given his poor performance status.  After discussion today we have elected to proceed with hospice based off his wishes and he would be a very poor candidate for chemotherapy.  2. IV access: Port-A-Cath currently in place and will be used as needed.   3. Androgen deprivation therapy: It is reasonable to stop androgen deprivation at this time.  4.  Prognosis and goals of care: He is approaching end-stage status at this time with very limited life expectancy.  He would be hospice eligible at this time.  5. Follow-up: Will be as needed as we rely on hospice for symptom management and updates.  25 minutes were spent face-to-face with the patient today.  More than 50% of the time was dedicated to reviewing laboratory data, imaging studies, treatment options and discussing future plan of care.   Zola Button, MD 11/13/20201:08 PM

## 2019-11-05 LAB — PROSTATE-SPECIFIC AG, SERUM (LABCORP): Prostate Specific Ag, Serum: 52 ng/mL — ABNORMAL HIGH (ref 0.0–4.0)

## 2019-11-07 ENCOUNTER — Telehealth: Payer: Self-pay | Admitting: Oncology

## 2019-11-07 NOTE — Telephone Encounter (Signed)
No los per 11/13.

## 2019-11-14 ENCOUNTER — Telehealth: Payer: Self-pay | Admitting: Family Medicine

## 2019-11-14 ENCOUNTER — Telehealth: Payer: Self-pay

## 2019-11-14 NOTE — Telephone Encounter (Signed)
noted 

## 2019-11-14 NOTE — Telephone Encounter (Signed)
-----   Message from [place], LPN sent at 24/17/5301  3:24 PM EST ----- Regarding: Hospice Patient wife Edmonia Caprio called to report patient has been accepted to Hospice. Reports that he will remain at home at this time.

## 2019-11-14 NOTE — Telephone Encounter (Signed)
noed

## 2019-11-14 NOTE — Telephone Encounter (Signed)
Dr. Gala Romney, pt's wife called to let you know he in now under hospice care at home.

## 2019-11-21 ENCOUNTER — Other Ambulatory Visit: Payer: Self-pay | Admitting: Family Medicine

## 2019-11-30 ENCOUNTER — Telehealth: Payer: Self-pay | Admitting: Internal Medicine

## 2019-11-30 NOTE — Telephone Encounter (Signed)
So sorry to hear this.

## 2019-11-30 NOTE — Telephone Encounter (Signed)
We are going to send a card to his family.

## 2019-11-30 NOTE — Telephone Encounter (Signed)
Patient wife called to let us know that he passed away on 2019/11/30

## 2019-11-30 NOTE — Telephone Encounter (Signed)
Very sad to hear this.

## 2019-11-30 NOTE — Telephone Encounter (Signed)
I'm so sorry to hear this

## 2019-11-30 NOTE — Telephone Encounter (Signed)
I have sent a Sympathy card to his family

## 2019-11-30 NOTE — Telephone Encounter (Signed)
Noted.  We should send at least a card

## 2019-12-23 DEATH — deceased

## 2020-02-16 IMAGING — CT CT ABD-PELV W/ CM
2 of 5 series · 14 of 46 positions shown, 16 images · IV contrast (Omni 300)
Comparison: CT 09/30/2018

CLINICAL DATA: 62-year-old male with a history of midline abdominal
pain for 1 day. Diarrhea.

History of prior obstructions.
History of Crohn disease.
EXAM:
CT ABDOMEN AND PELVIS WITH CONTRAST
TECHNIQUE: Multidetector CT imaging of the abdomen and pelvis was performed
using the standard protocol following bolus administration of
intravenous contrast.
CONTRAST:  100mL OMNIPAQUE IOHEXOL 300 MG/ML  SOLN

[Series 3: a/p w/ 5mm · axial · 0.65mm/px · z∈[+790,+1170]mm · 11 of 86 slices shown, 13 images]
[im 5/86  soft-tissue]
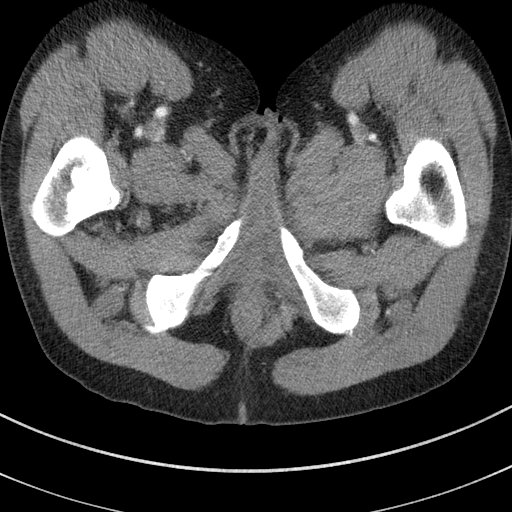
[im 5/86  bone]
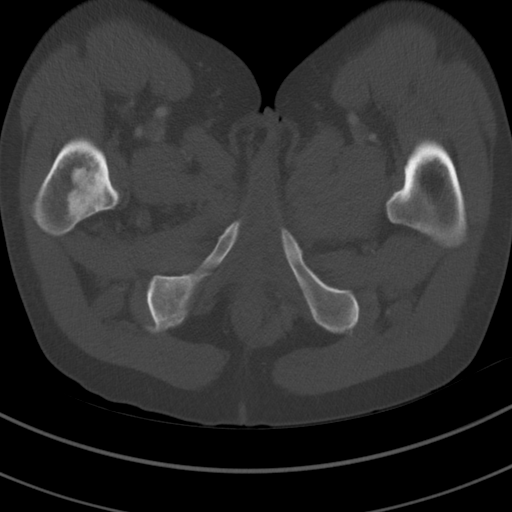
[im 14/86  soft-tissue]
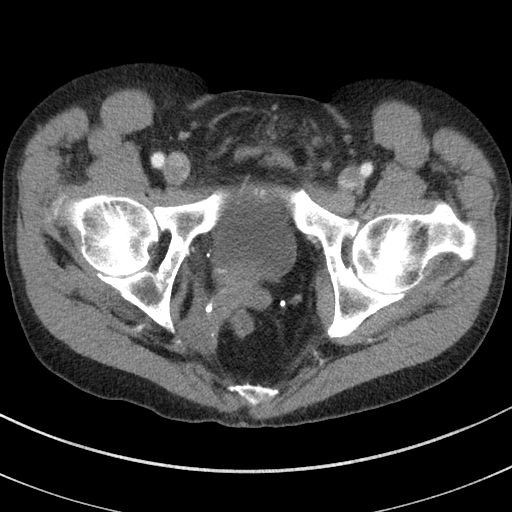
[im 23/86  soft-tissue]
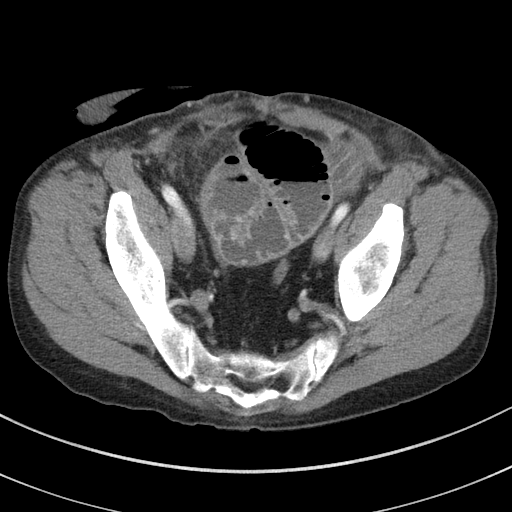
[im 27/86  soft-tissue]
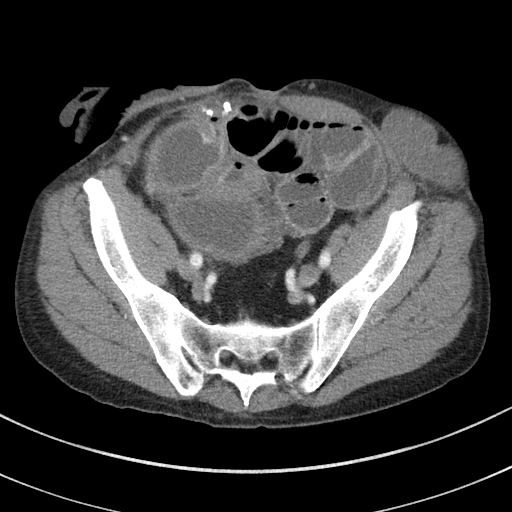
[im 36/86  soft-tissue]
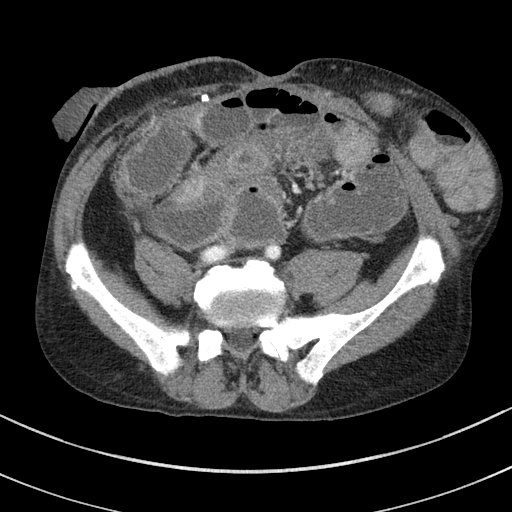
[im 45/86  soft-tissue]
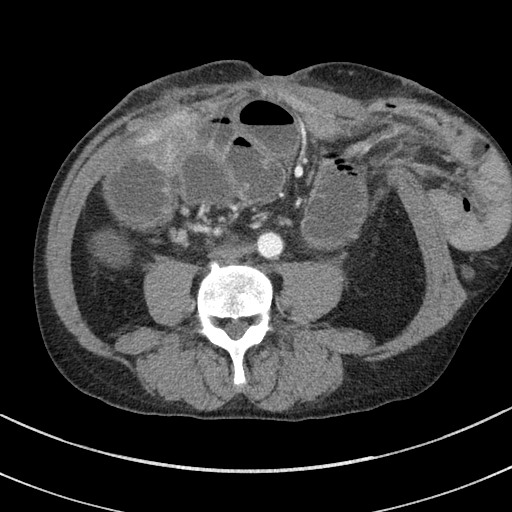
[im 50/86  soft-tissue]
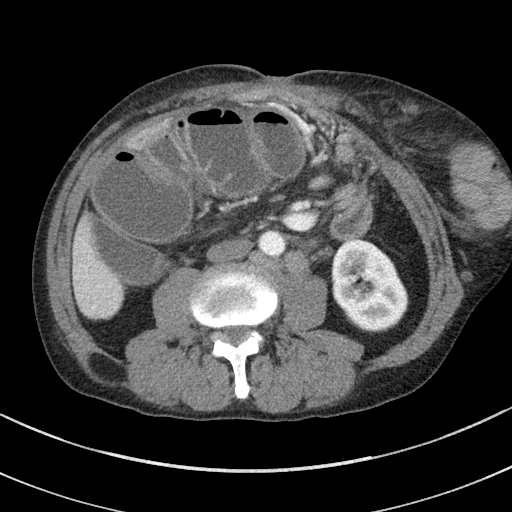
[im 59/86  soft-tissue]
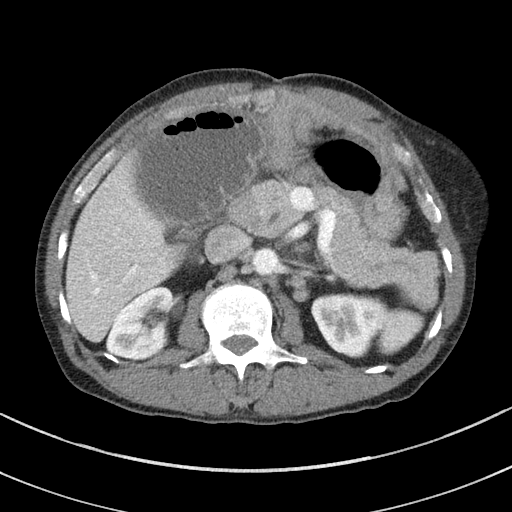
[im 63/86  soft-tissue]
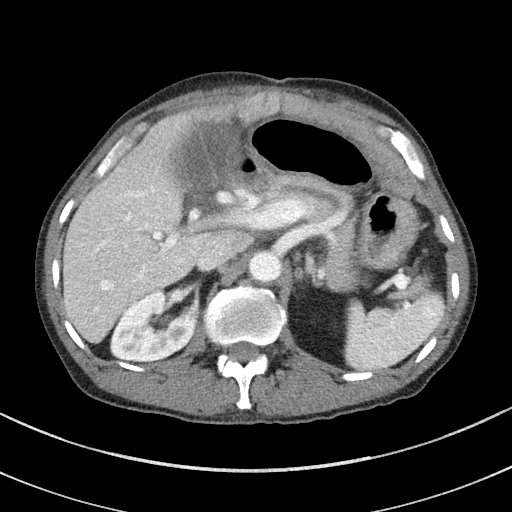
[im 63/86  bone]
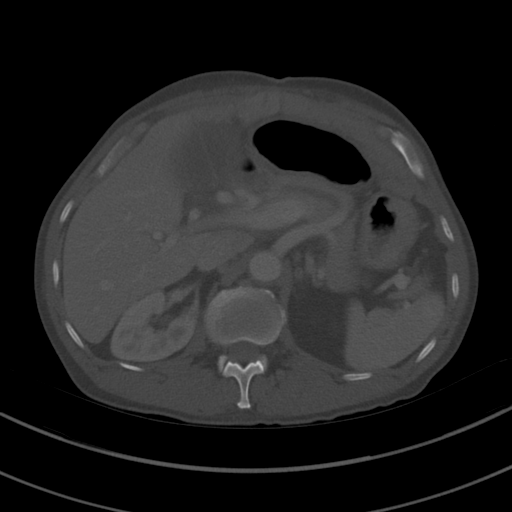
[im 72/86  soft-tissue]
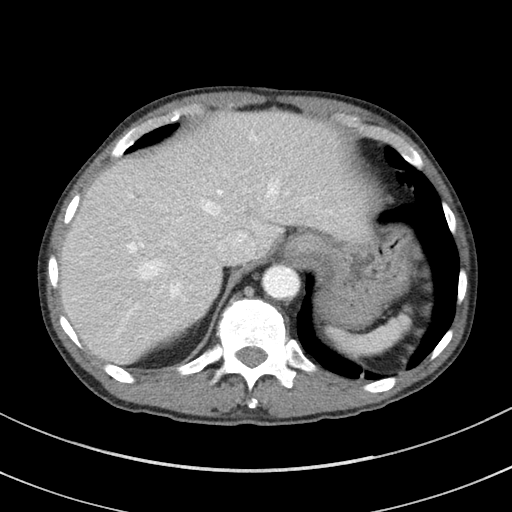
[im 81/86  soft-tissue]
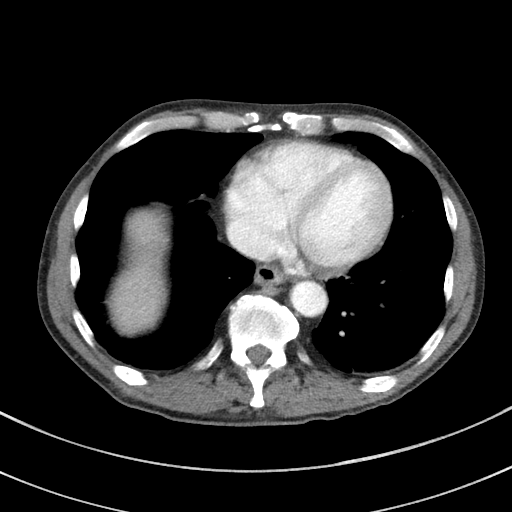

[Series 6: a/p w/ cor · coronal · 0.65mm/px · 3 of 124 slices shown]
[im 42/124  soft-tissue]
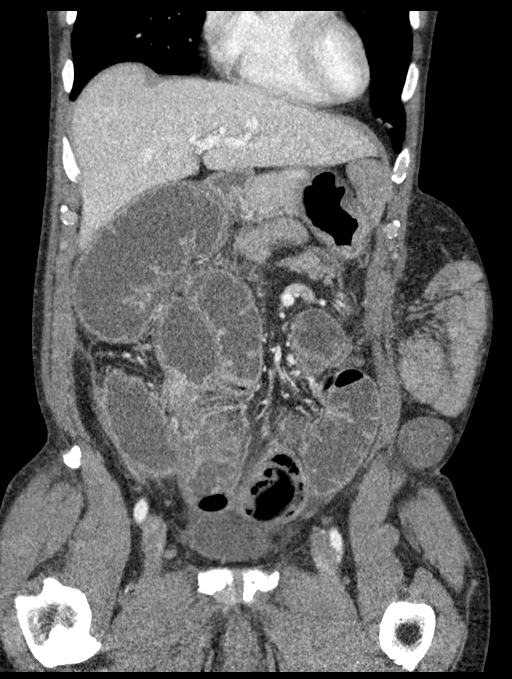
[im 55/124  soft-tissue]
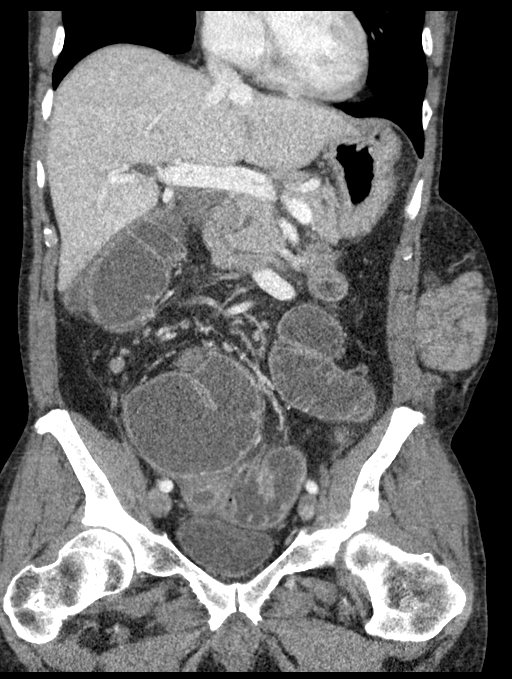
[im 69/124  soft-tissue]
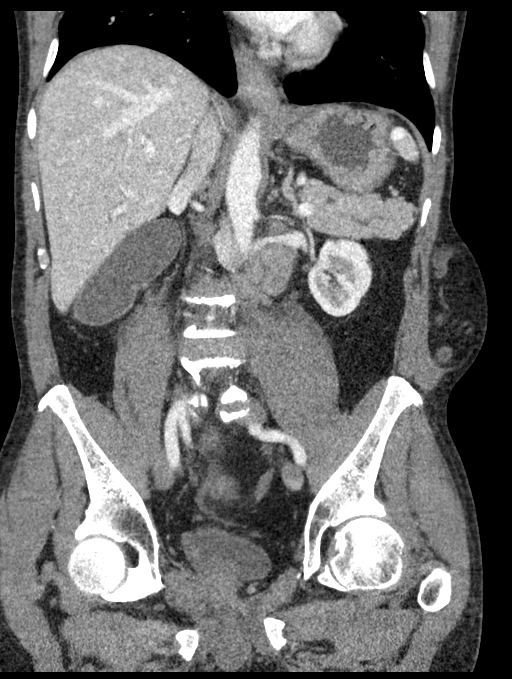

[14 of 46 positions shown; findings below may reference images not displayed]

FINDINGS: Lower chest: No acute abnormality.

Hepatobiliary: Unremarkable appearance of liver. Unremarkable
gallbladder.

Pancreas: Unremarkable pancreas parenchyma. Redemonstration of
pancreatic duct dilation with no radiopaque stones..

Spleen: Unremarkable spleen

Adrenals/Urinary Tract: Unremarkable adrenal glands.

Unremarkable appearance of the kidneys with no hydronephrosis. No
nephrolithiasis.

Urinary bladder unchanged from the prior. High density material
layered in the right aspect of the urinary bladder, contralateral
from the prior CT likely representing mobile stones.

Stomach/Bowel: Small hiatal hernia.  Unremarkable stomach.

Similar configuration of small bowel to the comparison CT scan with
multiple dilated small bowel loops and associated air-fluid levels.

There are hyperenhancing and decompressed small bowel loops in the
low abdomen, just superior to the ostomy. Associated increased
vasculature of the mesenteric arterial system/venous system, best
appreciated on the coronal reformatted images and compatible with a
comb sign.

There are at least 2 sites of persistent narrowing of small bowel
when compared to the CT dated 09/30/2018, suggesting chronic
strictures and may be related to the appearance of obstructed bowel.

Stoma of the right lower quadrant.

Similar appearance of the hernia sac of the left spigelian region
containing multiple loops of small bowel. None of these are
significantly dilated.

Vascular/Lymphatic: No significant vascular calcifications.

Multiple lymph nodes of the retroperitoneum are again visualized,
most prominent on the left. Multiple enlarged lymph nodes of the
right lower quadrant associated with the ileal colic mesentery.

Reproductive: Mass of the right aspect of the pelvis, unchanged from
prior measuring 2.7 cm x 5.5 cm on the current CT, relatively
unchanged from prior. This is inseparable with the residual rectum
in prostate. And is inseparable from the bladder base.

Other: Surgical changes of the midline abdomen.

Musculoskeletal: No acute displaced fracture. Redemonstration of
sclerotic lesions of the bilateral iliac bones, pubic bone,
bilateral ischial bones, right proximal femur.
IMPRESSION: Multiple dilated small bowel loops with air-fluid levels of the
abdomen, suggesting at least partial small bowel obstruction. The
etiology is favored to be a combination of both acute and chronic
changes of inflammatory bowel disease in this patient with given
history of Crohn's disease.

There are at least 2 focal short-segment regions of narrowing of
small bowel in the right lower quadrant adjacent to the ostomy,
which may represent chronic strictures, given these are relatively
similar when compared to the recent prior CT.

Hyperenhancing bowel of the right lower quadrant with a vascular
"comb sign" suggesting active Crohn's disease.

Redemonstration of left spigelian hernia with multiple small bowel
loops.

Redemonstration of lymphadenopathy of the ileal colic mesentery and
of retroperitoneal region.

Redemonstration of pelvic sclerotic metastases and soft tissue in
the right pelvis inseparable from prostate, rectum, and bladder base
in this patient with given history of prostate carcinoma.

Redemonstration of pancreatic ductal dilatation without associated
inflammation.

## 2020-08-08 IMAGING — DX CHEST - 2 VIEW
2 series · 2 of 2 positions shown · non-contrast
Comparison: 04/07/2019

CLINICAL DATA: Increased sputum production

EXAM:
CHEST - 2 VIEW

[chest pa]
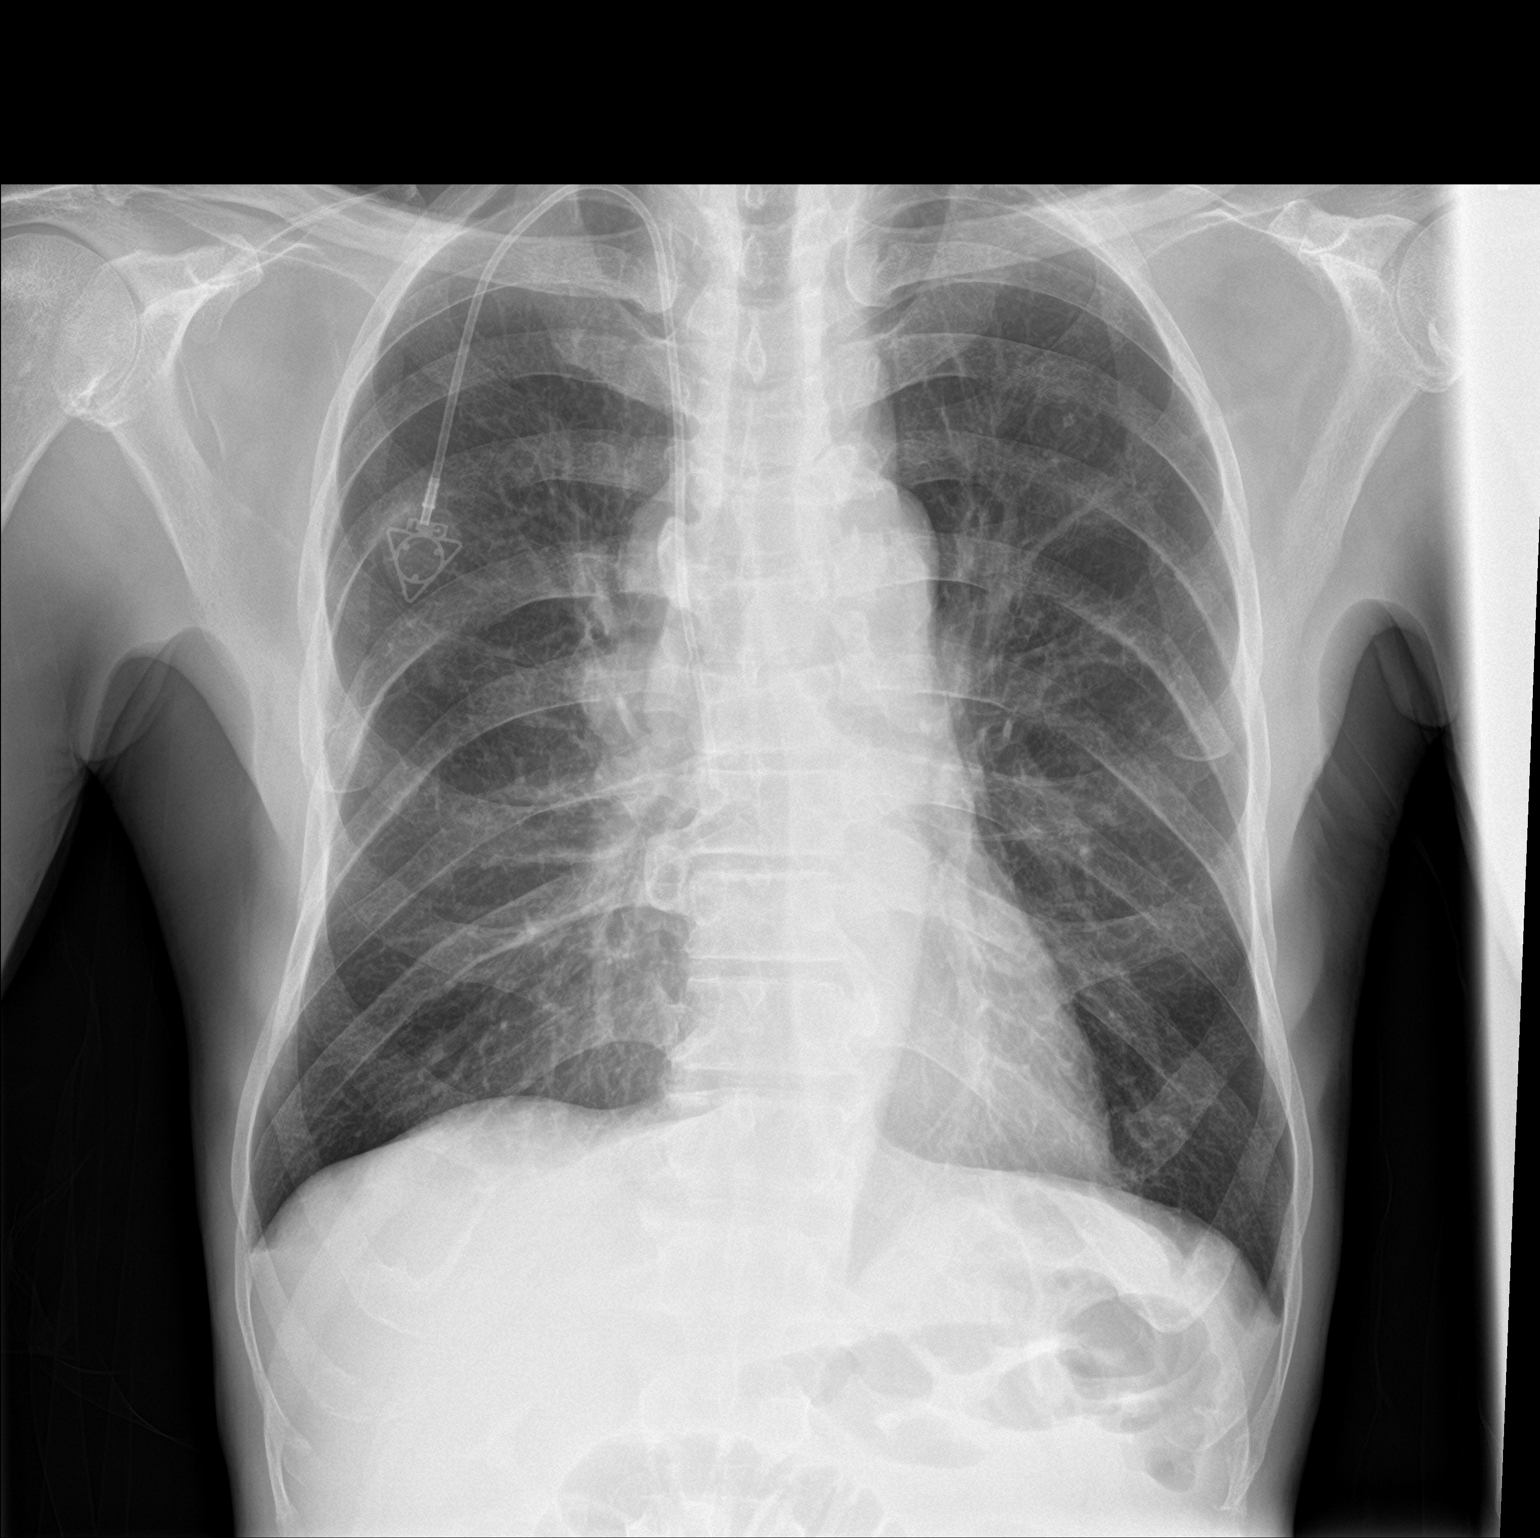

[chest lat]
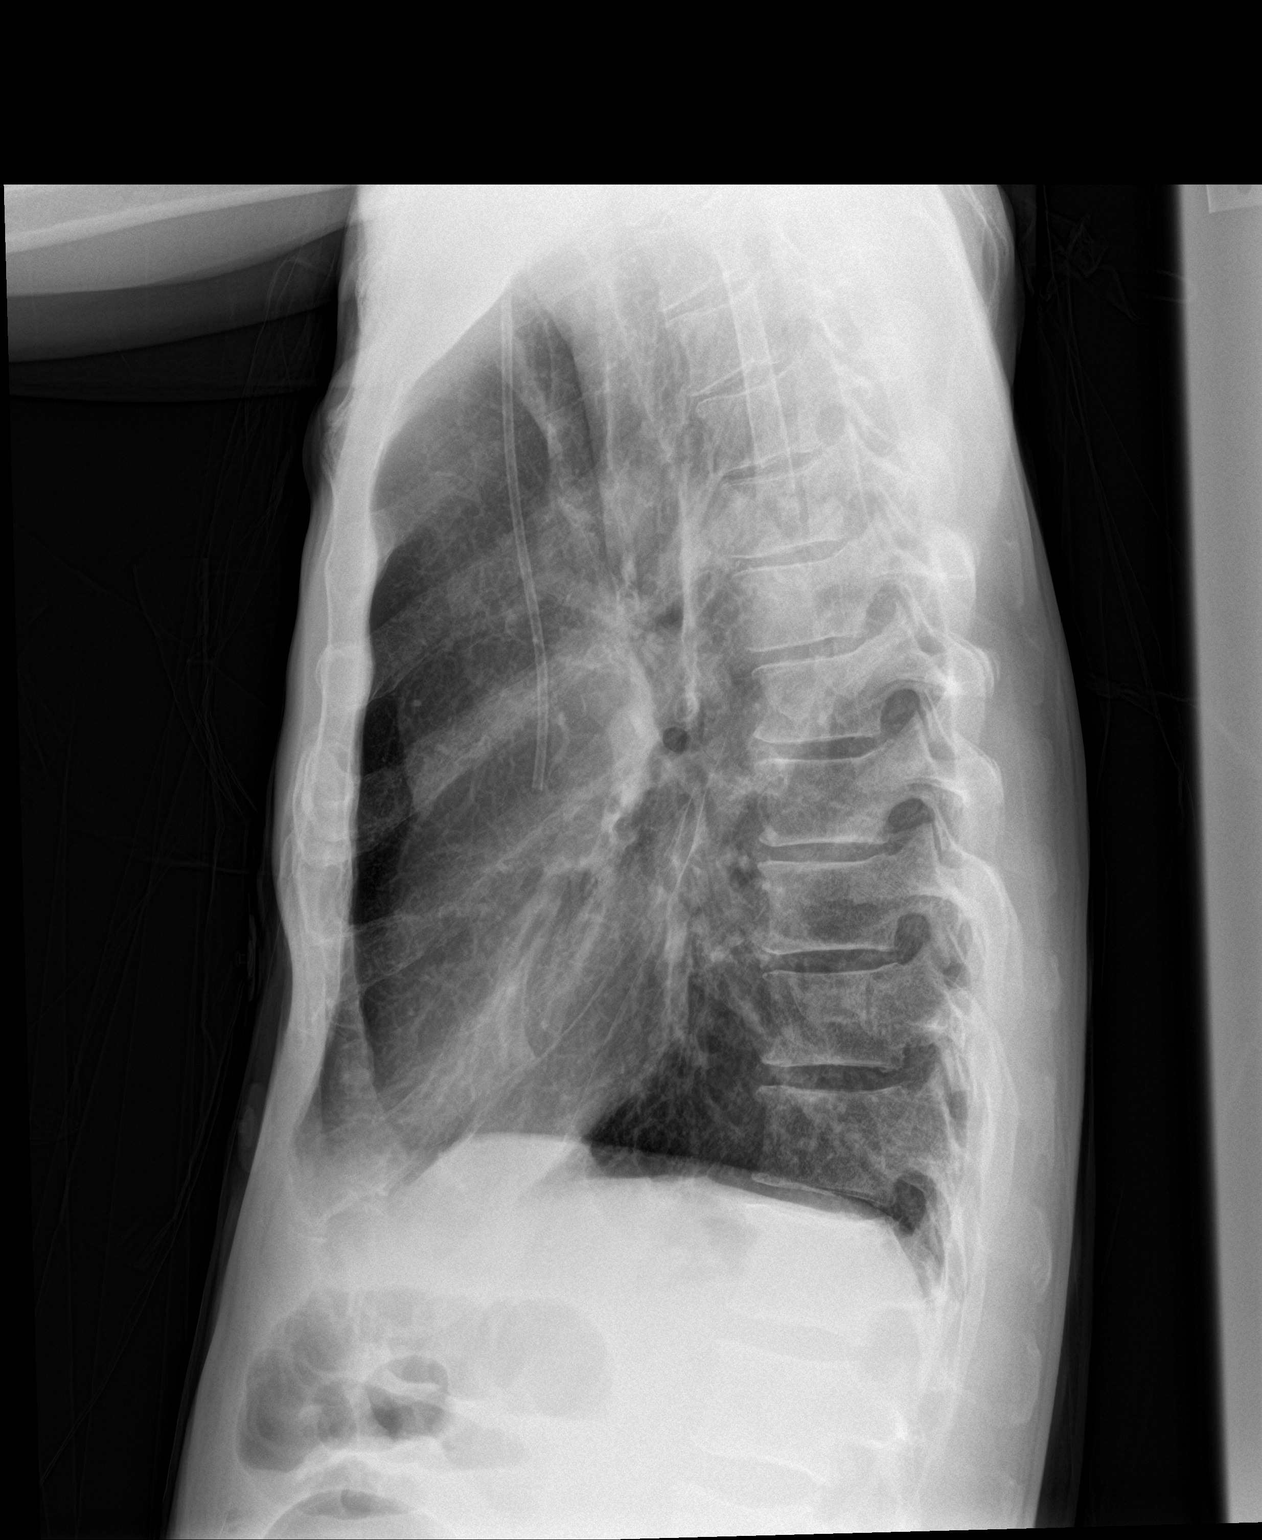

[2 of 2 positions shown; findings below may reference images not displayed]

FINDINGS: Hyperinflation. Right Port-A-Cath remains in place, unchanged. Heart
and mediastinal contours are within normal limits. No focal
opacities or effusions. No acute bony abnormality.
IMPRESSION: Hyperinflation.  No active disease.
